# Patient Record
Sex: Female | Born: 1935
Health system: Southern US, Community
[De-identification: ages and names within clinical notes are randomized; demographics above are authoritative.]

## PROBLEM LIST (undated history)

## (undated) DIAGNOSIS — I272 Pulmonary hypertension, unspecified: Secondary | ICD-10-CM

## (undated) DIAGNOSIS — M19079 Primary osteoarthritis, unspecified ankle and foot: Secondary | ICD-10-CM

## (undated) DIAGNOSIS — Z923 Personal history of irradiation: Secondary | ICD-10-CM

## (undated) DIAGNOSIS — K922 Gastrointestinal hemorrhage, unspecified: Secondary | ICD-10-CM

## (undated) DIAGNOSIS — E785 Hyperlipidemia, unspecified: Secondary | ICD-10-CM

## (undated) DIAGNOSIS — K5790 Diverticulosis of intestine, part unspecified, without perforation or abscess without bleeding: Secondary | ICD-10-CM

## (undated) DIAGNOSIS — R339 Retention of urine, unspecified: Secondary | ICD-10-CM

## (undated) DIAGNOSIS — Z7901 Long term (current) use of anticoagulants: Secondary | ICD-10-CM

## (undated) DIAGNOSIS — I2510 Atherosclerotic heart disease of native coronary artery without angina pectoris: Secondary | ICD-10-CM

## (undated) DIAGNOSIS — I10 Essential (primary) hypertension: Secondary | ICD-10-CM

## (undated) DIAGNOSIS — C801 Malignant (primary) neoplasm, unspecified: Secondary | ICD-10-CM

## (undated) DIAGNOSIS — I4891 Unspecified atrial fibrillation: Secondary | ICD-10-CM

## (undated) DIAGNOSIS — K219 Gastro-esophageal reflux disease without esophagitis: Secondary | ICD-10-CM

## (undated) DIAGNOSIS — G459 Transient cerebral ischemic attack, unspecified: Secondary | ICD-10-CM

## (undated) DIAGNOSIS — R609 Edema, unspecified: Secondary | ICD-10-CM

## (undated) DIAGNOSIS — I639 Cerebral infarction, unspecified: Secondary | ICD-10-CM

## (undated) DIAGNOSIS — S22080A Wedge compression fracture of T11-T12 vertebra, initial encounter for closed fracture: Secondary | ICD-10-CM

## (undated) DIAGNOSIS — I340 Nonrheumatic mitral (valve) insufficiency: Secondary | ICD-10-CM

## (undated) DIAGNOSIS — E790 Hyperuricemia without signs of inflammatory arthritis and tophaceous disease: Secondary | ICD-10-CM

## (undated) DIAGNOSIS — R32 Unspecified urinary incontinence: Secondary | ICD-10-CM

## (undated) DIAGNOSIS — I509 Heart failure, unspecified: Secondary | ICD-10-CM

## (undated) HISTORY — DX: Edema, unspecified: R60.9

## (undated) HISTORY — DX: Pulmonary hypertension, unspecified: I27.20

## (undated) HISTORY — PX: TONSILLECTOMY AND ADENOIDECTOMY: SHX28

## (undated) HISTORY — PX: TONSILLECTOMY: SUR1361

## (undated) HISTORY — DX: Gastrointestinal hemorrhage, unspecified: K92.2

## (undated) HISTORY — DX: Nonrheumatic mitral (valve) insufficiency: I34.0

## (undated) HISTORY — DX: Essential (primary) hypertension: I10

## (undated) HISTORY — PX: DILATION AND CURETTAGE OF UTERUS: SHX78

## (undated) HISTORY — PX: MASTECTOMY: SHX3

## (undated) HISTORY — DX: Primary osteoarthritis, unspecified ankle and foot: M19.079

## (undated) HISTORY — DX: Heart failure, unspecified: I50.9

## (undated) HISTORY — DX: Transient cerebral ischemic attack, unspecified: G45.9

## (undated) HISTORY — PX: REPLACEMENT TOTAL KNEE BILATERAL: SUR1225

## (undated) HISTORY — DX: Long term (current) use of anticoagulants: Z79.01

## (undated) HISTORY — PX: GALLBLADDER SURGERY: SHX652

## (undated) HISTORY — PX: CATARACT EXTRACTION, BILATERAL: SHX1313

## (undated) HISTORY — DX: Retention of urine, unspecified: R33.9

## (undated) HISTORY — DX: Hyperlipidemia, unspecified: E78.5

## (undated) HISTORY — PX: APPENDECTOMY: SHX54

## (undated) HISTORY — DX: Atherosclerotic heart disease of native coronary artery without angina pectoris: I25.10

## (undated) HISTORY — DX: Cerebral infarction, unspecified: I63.9

## (undated) HISTORY — DX: Hyperuricemia without signs of inflammatory arthritis and tophaceous disease: E79.0

## (undated) HISTORY — DX: Malignant (primary) neoplasm, unspecified: C80.1

## (undated) HISTORY — DX: Unspecified urinary incontinence: R32

## (undated) HISTORY — DX: Unspecified atrial fibrillation: I48.91

## (undated) NOTE — *Deleted (*Deleted)
[facility]  Telephone:(336) [phone] Fax:(336) [phone]     ID: Adrienne [name] DOB: [birth year]  MR#: [id number]  XNA#:[id number]  Patient Care Team: Sigmund Hazel, MD as PCP - General (Family Medicine) Ardell Isaacs, Forrestine Him, MD as Consulting Physician (Obstetrics and Gynecology) Melaina Howerton, Valentino Hue, MD as Consulting Physician (Oncology) Jake Bathe, MD as Consulting Physician (Cardiology) Bufford Buttner, MD as Referring Physician (Dermatology) Josephine Igo, DO as Consulting Physician (Pulmonary Disease) Emelia Loron, MD as Consulting Physician (General Surgery) Antony Blackbird, MD as Consulting Physician (Radiation Oncology) Lowella Dell, MD OTHER MD:  CHIEF COMPLAINT: Bilateral breast cancer,  estrogen receptor positive (s/p right mastectomy)  CURRENT TREATMENT: Observation   INTERVAL HISTORY: Adrienne [name] returns today for follow up of her newly diagnosed left breast cancer accompanied by her husband.   Since her last visit, she underwent chest CT on 06/29/2020 showing: previous nodules in posterior right upper lobe and in lateral right middle lobe no longer identified; unchanged nodules in right lung base and right middle lobe; no new nodules; new collection in left breast measuring 7.1 cm.  She proceeded to left diagnostic mammography with tomography and left breast ultrasonography at [facility] on 07/31/2020 showing: breast density category C; complex fluid collection favored to represent a retracting hematoma at site of left lumpectomy; no evidence of malignancy or signs of infection.   REVIEW OF SYSTEMS: Adrienne [name]    COVID 19 VACCINATION STATUS: Status post Pfizer x2 with booster October 2021   RIGHT BREAST CANCER HISTORY: From the original intake note:  Adrienne [name] has a right breast mass noted by her primary care doctor in [place] in 2012.  She was referred to surgery and underwent right mastectomy and  axillary lymph node dissection or sampling (we do not have the pathology report) 06/04/2011 for what appears to have been a multicentric invasive ductal carcinoma, grade 2.  There were three separate tumor foci, spanning a total of 3.8 cm. Prognostic indicators were: ER positive, PR positive, and Her2 negative. One out of 3 biopsied lymph nodes was positive for micrometastasis.   LEFT BREAST CANCER HISTORY: Adrienne [name] had routine left screening mammography on 11/19/2019 showing a possible abnormality. She underwent left diagnostic mammography with tomography and left breast ultrasonography at [facility] on 12/05/2019 showing: breast density category C; 0.7 cm left breast mass at 11 o'clock; development of three left breast cysts compared to prior imaging from [place] in 11/2017; no left axillary adenopathy.  Accordingly on 12/10/2019 she proceeded to biopsy of the left breast area in question. The pathology from this procedure ([id number]) showed: invasive ductal carcinoma, grade 3; ductal carcinoma in situ. Prognostic indicators significant for: estrogen receptor, 0% negative and progesterone receptor, 0% negative. Proliferation marker Ki67 at 90%. HER2 negative by immunohistochemistry (0).  The patient's subsequent history is as detailed below.   PAST MEDICAL HISTORY: Past Medical History:  Diagnosis Date  . Afib (HCC)   . Arthritis of ankle   . CAD (coronary artery disease)   . Cancer [facility]) 2012   breast cancer-right  . Compression fracture of T12 vertebra (HCC)   . Diverticulosis   . Edema   . Elevated uric acid in blood   . GERD (gastroesophageal reflux disease)   . GI bleed 12/2015  . Hyperlipidemia   . Hypertension   . Long-term (current) use of anticoagulants   . Moderate mitral regurgitation   . Personal history of radiation therapy   . Pulmonary HTN (HCC)  Echo 1/19: EF 60-65, no RWMA, Ao sclerosis, trivial AI, mild MR, mod BAE, mod TR, PASP 49  . Stroke (HCC) 01/25/2016   . TIA (transient ischemic attack) 09/2011  . Urinary incontinence   . Urinary retention     PAST SURGICAL HISTORY: Past Surgical History:  Procedure Laterality Date  . APPENDECTOMY    . AXILLARY SENTINEL NODE BIOPSY Left 01/22/2020   Procedure: Left Axillary Sentinel Node Biopsy;  Surgeon: Emelia Loron, MD;  Location: [facility] OR;  Service: General;  Laterality: Left;  . BREAST LUMPECTOMY Left 01/2020  . BREAST LUMPECTOMY WITH RADIOACTIVE SEED AND SENTINEL LYMPH NODE BIOPSY Left 01/22/2020   Procedure: LEFT BREAST LUMPECTOMY WITH RADIOACTIVE SEED;  Surgeon: Emelia Loron, MD;  Location: [facility] OR;  Service: General;  Laterality: Left;  . BREAST SURGERY  2012   Rt.mastectomy--[place]  . CARDIAC CATHETERIZATION  2013  . CATARACT EXTRACTION, BILATERAL    . DILATION AND CURETTAGE OF UTERUS     in her early 34's for DUB  . ESOPHAGOGASTRODUODENOSCOPY Left 01/08/2016   Procedure: ESOPHAGOGASTRODUODENOSCOPY (EGD);  Surgeon: Willis Modena, MD;  Location: [facility];  Service: Endoscopy;  Laterality: Left;  . GALLBLADDER SURGERY    . MASTECTOMY Right   . RADIOLOGY WITH ANESTHESIA N/A 01/25/2016   Procedure: RADIOLOGY WITH ANESTHESIA;  Surgeon: Julieanne Cotton, MD;  Location: [facility] OR;  Service: Radiology;  Laterality: N/A;  . REPLACEMENT TOTAL KNEE BILATERAL    . TONSILLECTOMY    . TONSILLECTOMY AND ADENOIDECTOMY     as a child    FAMILY HISTORY: Family History  Problem Relation Age of Onset  . Asthma Mother   . CVA Father   . Atrial fibrillation Sister        HAS PACER  . Pneumonia Brother   . Cancer Daughter        COLON CANCER  . Cancer Maternal Grandmother        breast cancer   Patient's father was 54 years old when he died from stroke. Patient's mother died from coronary artery disease at age 80. The patient denies a family hx of ovarian cancer. She reports breast cancer in her maternal grandmother. She has 2 siblings, 1 brother and 1 sister. Her brother died from  stroke/AIDS. She also reports pancreatic cancer in her paternal grandmother and colon cancer in her daughter.   GYNECOLOGIC HISTORY:  Patient's last menstrual period was 09/12/1973 (approximate). Menarche: 81 years old Age at first live birth: 89 years old GX P 3 LMP late 23s Contraceptive never HRT yes, remote , less than 6 months Hysterectomy?  No BSO?  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  Her husband Adrienne [name] is a Public affairs consultant, (PhD, worked at [employer]).  Their children are Adrienne [name] who lives in [place] and whose husband is a Charity fundraiser; Adrienne Kettle [employer]") who lives in [place] and works in finances; and Atkins, who lives in [place] and works for [employer].  The patient has 11 grandchildren and 8 great-grandchildren.  She attends [employer] locally    ADVANCED DIRECTIVES: In the absence of documents to the contrary the patient's husband is her healthcare power of attorney   HEALTH MAINTENANCE: Social History   Tobacco Use  . Smoking status: Former Smoker    Packs/day: 0.25    Years: 2.00    Pack years: 0.50    Types: Cigarettes  Quit date: 69    Years since quitting: 71.9  . Smokeless tobacco: Never Used  Vaping Use  . Vaping Use: Never used  Substance Use Topics  . Alcohol use: No    Alcohol/week: 0.0 standard drinks  . Drug use: No     Colonoscopy: Remote  PAP: 11/2015, negative  Bone density: Osteopenia   Allergies  Allergen Reactions  . Valium [Diazepam] Other (See Comments)    HYPERACTIVITY   . Amiodarone Hcl [Amiodarone] Other (See Comments)    AFIB  . Compazine [Prochlorperazine] Other (See Comments)    HYPERACTIVITY   . Other Other (See Comments)    "Kidney dye"--patient states this gave her severe shakes/chills  . Thorazine [Chlorpromazine] Other (See Comments)    HYPERACTIVITY   . Zometa [Zoledronic Acid] Other (See  Comments)    PROBLEMS WITH EYES  TIA  . Ciprofloxacin Swelling    "swelling" in Rt.elbow    Current Outpatient Medications  Medication Sig Dispense Refill  . acetaminophen (TYLENOL) 500 MG tablet Take 500-1,000 mg by mouth every 12 (twelve) hours as needed (pain).     Marland Kitchen albuterol (VENTOLIN HFA) 108 (90 Base) MCG/ACT inhaler Inhale 1 puff into the lungs every 6 (six) hours as needed for wheezing or shortness of breath (Cough). 18 g 2  . amoxicillin (AMOXIL) 500 MG capsule Take 2,000 mg by mouth See admin instructions. Take 4 capsules (2000 mg) by mouth 1 hour prior to dental appointments. (Patient not taking: Reported on 07/15/2020)    . Ascorbic Acid (VITAMIN C WITH ROSE HIPS) 500 MG tablet Take 500 mg by mouth in the morning and at bedtime.    Marland Kitchen atorvastatin (LIPITOR) 10 MG tablet TAKE 1 TABLET DAILY 90 tablet 0  . benzonatate (TESSALON) 100 MG capsule Take 1 capsule (100 mg total) by mouth 2 (two) times daily as needed for cough. (Patient not taking: Reported on 07/15/2020) 30 capsule 1  . Calcium Citrate-Vitamin D (SM CALCIUM CITRATE+D3 PETITE PO) Take 1 tablet by mouth in the morning, at noon, and at bedtime.    . cefaclor (CECLOR) 250 MG capsule Take 1 capsule (250 mg total) by mouth 3 (three) times daily. 15 capsule 0  . diltiazem (CARDIZEM) 60 MG tablet TAKE 1 TABLET THREE TIMES A DAY 270 tablet 0  . ELIQUIS 5 MG TABS tablet TAKE 1 TABLET TWICE A DAY 180 tablet 1  . furosemide (LASIX) 20 MG tablet Take 1 tablet (20 mg total) by mouth daily. (Patient not taking: Reported on 07/15/2020) 90 tablet 3  . metoprolol tartrate (LOPRESSOR) 100 MG tablet TAKE 1 TABLET TWICE A DAY 180 tablet 0  . Multiple Vitamin (MULTIVITAMIN WITH MINERALS) TABS tablet Take 1 tablet by mouth daily. Centrum for Women 50+    . nitrofurantoin (MACRODANTIN) 100 MG capsule Take 100 mg by mouth at bedtime.     Marland Kitchen omeprazole (PRILOSEC) 10 MG capsule Take 10 mg by mouth daily before breakfast.     . Probiotic Product (UP4  PROBIOTICS WOMENS PO) Take 1 capsule by mouth daily.     No current facility-administered medications for this visit.    OBJECTIVE: White woman in no acute distress  There were no vitals filed for this visit.   There is no height or weight on file to calculate BMI.   Wt Readings from Last 3 Encounters:  07/15/20 145 lb 6.4 oz (66 kg)  07/14/20 144 lb (65.3 kg)  06/24/20 146 lb (66.2 kg)      ECOG  FS:1 - Symptomatic but completely ambulatory  Sclerae unicteric, EOMs intact Wearing a mask No cervical or supraclavicular adenopathy Lungs no rales or rhonchi Heart regular rate and rhythm Abd soft, nontender, positive bowel sounds MSK no focal spinal tenderness, no upper extremity lymphedema Neuro: nonfocal, well oriented, appropriate affect Breasts:    {Sclerae unicteric, EOMs intact Wearing a mask No cervical or supraclavicular adenopathy Lungs no rales or rhonchi Heart regular rate and rhythm Abd soft, nontender, positive bowel sounds MSK no focal spinal tenderness, no upper extremity lymphedema Neuro: nonfocal, well oriented, appropriate affect Breasts: Status post right mastectomy with no evidence of local recurrence.  Status post left lumpectomy and radiation.  There is minimal hyperpigmentation.  There is some coarsening of the skin as expected.  There is no evidence of local recurrence.  Both axillae are benign.}  LAB RESULTS:  CMP     Component Value Date/Time   NA 136 05/12/2020 1240   NA 145 (H) 05/06/2019 1608   K 4.6 05/12/2020 1240   CL 102 05/12/2020 1240   CO2 26 05/12/2020 1240   GLUCOSE 139 (H) 05/12/2020 1240   BUN 19 05/12/2020 1240   BUN 19 05/06/2019 1608   CREATININE 1.11 (H) 05/12/2020 1240   CALCIUM 10.9 (H) 05/12/2020 1240   PROT 7.5 05/12/2020 1240   PROT 6.6 05/06/2019 1608   ALBUMIN 3.5 05/12/2020 1240   ALBUMIN 3.9 05/06/2019 1608   AST 12 (L) 05/12/2020 1240   ALT 8 05/12/2020 1240   ALKPHOS 94 05/12/2020 1240   BILITOT 0.6  05/12/2020 1240   BILITOT 0.4 05/06/2019 1608   GFRNONAA 44 (L) 05/12/2020 1240   GFRAA 51 (L) 05/12/2020 1240    No results found for: TOTALPROTELP, ALBUMINELP, A1GS, A2GS, BETS, BETA2SER, GAMS, MSPIKE, SPEI  No results found for: KPAFRELGTCHN, LAMBDASER, KAPLAMBRATIO  Lab Results  Component Value Date   WBC 9.8 05/12/2020   NEUTROABS 7.4 05/12/2020   HGB 12.3 05/12/2020   HCT 37.8 05/12/2020   MCV 92.2 05/12/2020   PLT 287 05/12/2020    No results found for: LABCA2  No components found for: [id number]  No results for input(s): INR in the last 168 hours.  No results found for: LABCA2  No results found for: WUJ811  No results found for: BJY782  No results found for: NFA213  No results found for: CA2729  No components found for: HGQUANT  No results found for: CEA1 / No results found for: CEA1   No results found for: AFPTUMOR  No results found for: CHROMOGRNA  No results found for: HGBA, HGBA2QUANT, HGBFQUANT, HGBSQUAN (Hemoglobinopathy evaluation)   No results found for: LDH  No results found for: IRON, TIBC, IRONPCTSAT (Iron and TIBC)  No results found for: FERRITIN  Urinalysis    Component Value Date/Time   COLORURINE YELLOW 08/29/2017 2343   APPEARANCEUR CLOUDY (A) 08/29/2017 2343   LABSPEC 1.005 08/29/2017 2343   PHURINE 6.0 08/29/2017 2343   GLUCOSEU NEGATIVE 08/29/2017 2343   HGBUR SMALL (A) 08/29/2017 2343   BILIRUBINUR n 04/14/2020 1705   KETONESUR NEGATIVE 08/29/2017 2343   PROTEINUR Negative 04/14/2020 1705   PROTEINUR NEGATIVE 08/29/2017 2343   UROBILINOGEN 0.2 04/14/2020 1705   NITRITE n 04/14/2020 1705   NITRITE NEGATIVE 08/29/2017 2343   LEUKOCYTESUR Negative 04/14/2020 1705    STUDIES: US BREAST LTD UNI LEFT INC AXILLA  Result Date: 07/31/2020 CLINICAL DATA:  38 year old female with history of left breast cancer diagnosed in March 2021 status post lumpectomy and radiation.  Patient presents for evaluation of a mass seen in the  left breast on recent CT scan. EXAM: DIGITAL DIAGNOSTIC LEFT MAMMOGRAM WITH TOMO ULTRASOUND LEFT BREAST COMPARISON:  Previous exam(s). ACR Breast Density Category c: The breast tissue is heterogeneously dense, which may obscure small masses. FINDINGS: Mammogram: Full field tomosynthesis and a spot 2D magnification view of the lumpectomy site were performed. There is a large oval mass at the lumpectomy site measuring approximately 5.3 cm. There is mild diffuse skin thickening consistent with post radiation change. There are no additional new findings elsewhere in the left breast. On physical exam, I palpate firmness throughout the upper inner aspect of the left breast. There is no erythema or skin breakdown. Ultrasound: Targeted ultrasound is performed in the left breast at 12 o'clock 3 cm from nipple at the site of the patient's lumpectomy scar demonstrating an oval circumscribed complex mass with fluid components as well as fibrinous material, likely a retracting hematoma. No internal vascularity. IMPRESSION: At the left breast lumpectomy site there is a complex fluid collection favored to represent a retracting hematoma. No mammographic or sonographic evidence of malignancy. No imaging or clinical signs of infection. RECOMMENDATION: Return for routine diagnostic surveillance which will be due in March 2022. I have discussed the findings and recommendations with the patient. If applicable, a reminder letter will be sent to the patient regarding the next appointment. BI-RADS CATEGORY  2: Benign. Electronically Signed   By: Emmaline Kluver M.D.   On: 07/31/2020 13:30   MM DIAG BREAST TOMO UNI LEFT  Result Date: 07/31/2020 CLINICAL DATA:  55 year old female with history of left breast cancer diagnosed in March 2021 status post lumpectomy and radiation. Patient presents for evaluation of a mass seen in the left breast on recent CT scan. EXAM: DIGITAL DIAGNOSTIC LEFT MAMMOGRAM WITH TOMO ULTRASOUND LEFT BREAST  COMPARISON:  Previous exam(s). ACR Breast Density Category c: The breast tissue is heterogeneously dense, which may obscure small masses. FINDINGS: Mammogram: Full field tomosynthesis and a spot 2D magnification view of the lumpectomy site were performed. There is a large oval mass at the lumpectomy site measuring approximately 5.3 cm. There is mild diffuse skin thickening consistent with post radiation change. There are no additional new findings elsewhere in the left breast. On physical exam, I palpate firmness throughout the upper inner aspect of the left breast. There is no erythema or skin breakdown. Ultrasound: Targeted ultrasound is performed in the left breast at 12 o'clock 3 cm from nipple at the site of the patient's lumpectomy scar demonstrating an oval circumscribed complex mass with fluid components as well as fibrinous material, likely a retracting hematoma. No internal vascularity. IMPRESSION: At the left breast lumpectomy site there is a complex fluid collection favored to represent a retracting hematoma. No mammographic or sonographic evidence of malignancy. No imaging or clinical signs of infection. RECOMMENDATION: Return for routine diagnostic surveillance which will be due in March 2022. I have discussed the findings and recommendations with the patient. If applicable, a reminder letter will be sent to the patient regarding the next appointment. BI-RADS CATEGORY  2: Benign. Electronically Signed   By: Emmaline Kluver M.D.   On: 07/31/2020 13:30     ELIGIBLE FOR AVAILABLE RESEARCH PROTOCOL: No  ASSESSMENT: 29 y.o. [place] woman   RIGHT BREAST CANCER (1) status post right mastectomy and sentinel lymph node sampling September 2012 for what appears to have been a multifocal T1-T2 N1 (mic) invasive ductal carcinoma, estrogen and progesterone receptor positive, HER-2 not  amplified  (2) completed 5 years of anastrozole 2017  LEFT BREAST CANCER (3) status post left breast upper inner  quadrant biopsy 12/10/2019 for a clinical T1b N0, stage IB invasive ductal carcinoma grade 3, triple negative, with an MIB-1 of 90%  (4) status post left lumpectomy and sentinel lymph node sampling 01/22/2020 for a pT1a pN0, stage IA invasive ductal carcinoma, with negative margins  (a) ductal carcinoma in situ with necrosis also removed, all margins negative  (b) a total of 2 lymph nodes were removed  (5) adjuvant chemotherapy discussed, opted against  (6) meets criteria for genetics testing  (7) adjuvant radiation 03/02/2020 through 03/24/2020 Site Technique Total Dose (Gy) Dose per Fx (Gy) Completed Fx Beam Energies  Breast, Left: Breast_Lt 3D 42.72/42.72 2.67 16/16 6X, 15X    PLAN: Reita Cliche has completed her local treatment and did very well with it.  She is not a candidate for systemic therapy as her invasive cancer was triple negative but also T1a,  and chemotherapy is not indicated in that setting.  She wanted to know which arm she should have blood drawn from or her blood pressure taken on.  We do not know how many lymph nodes were removed from her right arm.  However that was remote and I suggested for the time being she should offer her right arm for blood draws and blood pressure and spare the left.  She will resume mammography in April 2022 and see me in May of that year.  She knows to call for any other issues that may develop before that visit.  Total encounter time 30 minutes.Lowella Dell, MD   08/05/2020 11:04 AM Medical Oncology and Hematology [facility] [address] [place], [place] [address] Tel. [phone]    Fax. [phone]   This document serves as a record of services personally performed by Ruthann Cancer, MD. It was created on his behalf by Mickie Bail, a trained medical scribe. The creation of this record is based on the scribe's personal observations and the provider's statements to them.   I, Ruthann Cancer MD, have  reviewed the above documentation for accuracy and completeness, and I agree with the above.   *Total Encounter Time as defined by the Centers for Medicare and Medicaid Services includes, in addition to the face-to-face time of a patient visit (documented in the note above) non-face-to-face time: obtaining and reviewing outside history, ordering and reviewing medications, tests or procedures, care coordination (communications with other health care professionals or caregivers) and documentation in the medical record.

---

## 2008-02-06 ENCOUNTER — Emergency Department (HOSPITAL_COMMUNITY): Admission: EM | Admit: 2008-02-06 | Discharge: 2008-02-07 | Payer: Self-pay | Admitting: Emergency Medicine

## 2010-09-12 DIAGNOSIS — C801 Malignant (primary) neoplasm, unspecified: Secondary | ICD-10-CM

## 2010-09-12 HISTORY — PX: BREAST SURGERY: SHX581

## 2010-09-12 HISTORY — DX: Malignant (primary) neoplasm, unspecified: C80.1

## 2011-06-08 LAB — DIFFERENTIAL
Basophils Relative: 0
Monocytes Absolute: 0.8
Monocytes Relative: 9
Neutro Abs: 5.6

## 2011-06-08 LAB — POCT I-STAT, CHEM 8
BUN: 15
Creatinine, Ser: 1
Potassium: 3.9
Sodium: 139
TCO2: 27

## 2011-06-08 LAB — CBC
HCT: 36.8
Hemoglobin: 12.5
MCHC: 34
RBC: 3.98

## 2011-09-13 DIAGNOSIS — G459 Transient cerebral ischemic attack, unspecified: Secondary | ICD-10-CM

## 2011-09-13 HISTORY — DX: Transient cerebral ischemic attack, unspecified: G45.9

## 2011-09-13 HISTORY — PX: CARDIAC CATHETERIZATION: SHX172

## 2013-09-12 DIAGNOSIS — R296 Repeated falls: Secondary | ICD-10-CM | POA: Diagnosis not present

## 2013-09-12 DIAGNOSIS — S6990XA Unspecified injury of unspecified wrist, hand and finger(s), initial encounter: Secondary | ICD-10-CM | POA: Diagnosis not present

## 2013-09-12 DIAGNOSIS — M259 Joint disorder, unspecified: Secondary | ICD-10-CM | POA: Diagnosis not present

## 2013-09-12 DIAGNOSIS — S66819A Strain of other specified muscles, fascia and tendons at wrist and hand level, unspecified hand, initial encounter: Secondary | ICD-10-CM | POA: Diagnosis not present

## 2013-09-12 DIAGNOSIS — S59919A Unspecified injury of unspecified forearm, initial encounter: Secondary | ICD-10-CM | POA: Diagnosis not present

## 2013-09-12 DIAGNOSIS — S63509A Unspecified sprain of unspecified wrist, initial encounter: Secondary | ICD-10-CM | POA: Diagnosis not present

## 2013-09-12 DIAGNOSIS — S63599A Other specified sprain of unspecified wrist, initial encounter: Secondary | ICD-10-CM | POA: Diagnosis not present

## 2013-09-12 DIAGNOSIS — S59909A Unspecified injury of unspecified elbow, initial encounter: Secondary | ICD-10-CM | POA: Diagnosis not present

## 2013-09-12 DIAGNOSIS — M25539 Pain in unspecified wrist: Secondary | ICD-10-CM | POA: Diagnosis not present

## 2013-09-17 DIAGNOSIS — I4891 Unspecified atrial fibrillation: Secondary | ICD-10-CM | POA: Diagnosis not present

## 2013-09-20 DIAGNOSIS — M899 Disorder of bone, unspecified: Secondary | ICD-10-CM | POA: Diagnosis not present

## 2013-09-20 DIAGNOSIS — C50919 Malignant neoplasm of unspecified site of unspecified female breast: Secondary | ICD-10-CM | POA: Diagnosis not present

## 2013-09-20 DIAGNOSIS — M949 Disorder of cartilage, unspecified: Secondary | ICD-10-CM | POA: Diagnosis not present

## 2013-09-25 DIAGNOSIS — I509 Heart failure, unspecified: Secondary | ICD-10-CM | POA: Diagnosis not present

## 2013-09-25 DIAGNOSIS — I5032 Chronic diastolic (congestive) heart failure: Secondary | ICD-10-CM | POA: Diagnosis not present

## 2013-09-25 DIAGNOSIS — I10 Essential (primary) hypertension: Secondary | ICD-10-CM | POA: Diagnosis not present

## 2013-09-25 DIAGNOSIS — I4891 Unspecified atrial fibrillation: Secondary | ICD-10-CM | POA: Diagnosis not present

## 2013-10-02 DIAGNOSIS — I5030 Unspecified diastolic (congestive) heart failure: Secondary | ICD-10-CM | POA: Diagnosis not present

## 2013-10-08 DIAGNOSIS — M79609 Pain in unspecified limb: Secondary | ICD-10-CM | POA: Diagnosis not present

## 2013-10-08 DIAGNOSIS — M810 Age-related osteoporosis without current pathological fracture: Secondary | ICD-10-CM | POA: Diagnosis not present

## 2013-10-08 DIAGNOSIS — IMO0001 Reserved for inherently not codable concepts without codable children: Secondary | ICD-10-CM | POA: Diagnosis not present

## 2013-10-08 DIAGNOSIS — I270 Primary pulmonary hypertension: Secondary | ICD-10-CM | POA: Diagnosis not present

## 2013-10-08 DIAGNOSIS — E7800 Pure hypercholesterolemia, unspecified: Secondary | ICD-10-CM | POA: Diagnosis not present

## 2013-10-08 DIAGNOSIS — M949 Disorder of cartilage, unspecified: Secondary | ICD-10-CM | POA: Diagnosis not present

## 2013-10-08 DIAGNOSIS — Z09 Encounter for follow-up examination after completed treatment for conditions other than malignant neoplasm: Secondary | ICD-10-CM | POA: Diagnosis not present

## 2013-10-08 DIAGNOSIS — I10 Essential (primary) hypertension: Secondary | ICD-10-CM | POA: Diagnosis not present

## 2013-10-14 DIAGNOSIS — I4891 Unspecified atrial fibrillation: Secondary | ICD-10-CM | POA: Diagnosis not present

## 2013-10-24 DIAGNOSIS — N3020 Other chronic cystitis without hematuria: Secondary | ICD-10-CM | POA: Diagnosis not present

## 2013-11-11 DIAGNOSIS — I4891 Unspecified atrial fibrillation: Secondary | ICD-10-CM | POA: Diagnosis not present

## 2013-11-11 DIAGNOSIS — I10 Essential (primary) hypertension: Secondary | ICD-10-CM | POA: Diagnosis not present

## 2013-11-20 DIAGNOSIS — N952 Postmenopausal atrophic vaginitis: Secondary | ICD-10-CM | POA: Diagnosis not present

## 2013-11-20 DIAGNOSIS — N390 Urinary tract infection, site not specified: Secondary | ICD-10-CM | POA: Diagnosis not present

## 2013-11-20 DIAGNOSIS — N8112 Cystocele, lateral: Secondary | ICD-10-CM | POA: Diagnosis not present

## 2013-11-26 DIAGNOSIS — I4891 Unspecified atrial fibrillation: Secondary | ICD-10-CM | POA: Diagnosis not present

## 2013-11-26 DIAGNOSIS — I10 Essential (primary) hypertension: Secondary | ICD-10-CM | POA: Diagnosis not present

## 2013-12-06 DIAGNOSIS — R300 Dysuria: Secondary | ICD-10-CM | POA: Diagnosis not present

## 2013-12-06 DIAGNOSIS — I4891 Unspecified atrial fibrillation: Secondary | ICD-10-CM | POA: Diagnosis not present

## 2013-12-06 DIAGNOSIS — H1130 Conjunctival hemorrhage, unspecified eye: Secondary | ICD-10-CM | POA: Diagnosis not present

## 2013-12-06 DIAGNOSIS — Z6828 Body mass index (BMI) 28.0-28.9, adult: Secondary | ICD-10-CM | POA: Diagnosis not present

## 2013-12-06 DIAGNOSIS — N390 Urinary tract infection, site not specified: Secondary | ICD-10-CM | POA: Diagnosis not present

## 2013-12-10 DIAGNOSIS — Z9010 Acquired absence of unspecified breast and nipple: Secondary | ICD-10-CM | POA: Diagnosis not present

## 2013-12-10 DIAGNOSIS — H1130 Conjunctival hemorrhage, unspecified eye: Secondary | ICD-10-CM | POA: Diagnosis not present

## 2013-12-10 DIAGNOSIS — C50919 Malignant neoplasm of unspecified site of unspecified female breast: Secondary | ICD-10-CM | POA: Diagnosis not present

## 2013-12-13 DIAGNOSIS — I4891 Unspecified atrial fibrillation: Secondary | ICD-10-CM | POA: Diagnosis not present

## 2013-12-27 DIAGNOSIS — I4891 Unspecified atrial fibrillation: Secondary | ICD-10-CM | POA: Diagnosis not present

## 2014-01-07 DIAGNOSIS — L821 Other seborrheic keratosis: Secondary | ICD-10-CM | POA: Diagnosis not present

## 2014-01-07 DIAGNOSIS — D2360 Other benign neoplasm of skin of unspecified upper limb, including shoulder: Secondary | ICD-10-CM | POA: Diagnosis not present

## 2014-01-07 DIAGNOSIS — R059 Cough, unspecified: Secondary | ICD-10-CM | POA: Diagnosis not present

## 2014-01-07 DIAGNOSIS — R05 Cough: Secondary | ICD-10-CM | POA: Diagnosis not present

## 2014-01-10 DIAGNOSIS — E119 Type 2 diabetes mellitus without complications: Secondary | ICD-10-CM | POA: Diagnosis not present

## 2014-01-10 DIAGNOSIS — R062 Wheezing: Secondary | ICD-10-CM | POA: Diagnosis not present

## 2014-01-10 DIAGNOSIS — J45909 Unspecified asthma, uncomplicated: Secondary | ICD-10-CM | POA: Diagnosis not present

## 2014-01-10 DIAGNOSIS — I517 Cardiomegaly: Secondary | ICD-10-CM | POA: Diagnosis not present

## 2014-01-10 DIAGNOSIS — J189 Pneumonia, unspecified organism: Secondary | ICD-10-CM | POA: Diagnosis not present

## 2014-01-10 DIAGNOSIS — R0602 Shortness of breath: Secondary | ICD-10-CM | POA: Diagnosis not present

## 2014-01-10 DIAGNOSIS — K219 Gastro-esophageal reflux disease without esophagitis: Secondary | ICD-10-CM | POA: Diagnosis not present

## 2014-01-10 DIAGNOSIS — J9600 Acute respiratory failure, unspecified whether with hypoxia or hypercapnia: Secondary | ICD-10-CM | POA: Diagnosis not present

## 2014-01-10 DIAGNOSIS — R05 Cough: Secondary | ICD-10-CM | POA: Diagnosis not present

## 2014-01-10 DIAGNOSIS — I700 Atherosclerosis of aorta: Secondary | ICD-10-CM | POA: Diagnosis not present

## 2014-01-10 DIAGNOSIS — I5032 Chronic diastolic (congestive) heart failure: Secondary | ICD-10-CM | POA: Diagnosis not present

## 2014-01-10 DIAGNOSIS — I4891 Unspecified atrial fibrillation: Secondary | ICD-10-CM | POA: Diagnosis not present

## 2014-01-10 DIAGNOSIS — J069 Acute upper respiratory infection, unspecified: Secondary | ICD-10-CM | POA: Diagnosis not present

## 2014-01-10 DIAGNOSIS — R059 Cough, unspecified: Secondary | ICD-10-CM | POA: Diagnosis not present

## 2014-01-11 DIAGNOSIS — I5031 Acute diastolic (congestive) heart failure: Secondary | ICD-10-CM | POA: Diagnosis not present

## 2014-01-11 DIAGNOSIS — R0602 Shortness of breath: Secondary | ICD-10-CM | POA: Diagnosis not present

## 2014-01-11 DIAGNOSIS — R0982 Postnasal drip: Secondary | ICD-10-CM | POA: Diagnosis present

## 2014-01-11 DIAGNOSIS — I5032 Chronic diastolic (congestive) heart failure: Secondary | ICD-10-CM | POA: Diagnosis not present

## 2014-01-11 DIAGNOSIS — J9801 Acute bronchospasm: Secondary | ICD-10-CM | POA: Diagnosis not present

## 2014-01-11 DIAGNOSIS — J069 Acute upper respiratory infection, unspecified: Secondary | ICD-10-CM | POA: Diagnosis not present

## 2014-01-11 DIAGNOSIS — I509 Heart failure, unspecified: Secondary | ICD-10-CM | POA: Diagnosis not present

## 2014-01-11 DIAGNOSIS — Z96659 Presence of unspecified artificial knee joint: Secondary | ICD-10-CM | POA: Diagnosis not present

## 2014-01-11 DIAGNOSIS — F3289 Other specified depressive episodes: Secondary | ICD-10-CM | POA: Diagnosis present

## 2014-01-11 DIAGNOSIS — I2789 Other specified pulmonary heart diseases: Secondary | ICD-10-CM | POA: Diagnosis not present

## 2014-01-11 DIAGNOSIS — J9600 Acute respiratory failure, unspecified whether with hypoxia or hypercapnia: Secondary | ICD-10-CM | POA: Diagnosis not present

## 2014-01-11 DIAGNOSIS — R0902 Hypoxemia: Secondary | ICD-10-CM | POA: Diagnosis not present

## 2014-01-11 DIAGNOSIS — Z87891 Personal history of nicotine dependence: Secondary | ICD-10-CM | POA: Diagnosis not present

## 2014-01-11 DIAGNOSIS — Z9010 Acquired absence of unspecified breast and nipple: Secondary | ICD-10-CM | POA: Diagnosis not present

## 2014-01-11 DIAGNOSIS — Z853 Personal history of malignant neoplasm of breast: Secondary | ICD-10-CM | POA: Diagnosis not present

## 2014-01-11 DIAGNOSIS — J159 Unspecified bacterial pneumonia: Secondary | ICD-10-CM | POA: Diagnosis not present

## 2014-01-11 DIAGNOSIS — I4891 Unspecified atrial fibrillation: Secondary | ICD-10-CM | POA: Diagnosis not present

## 2014-01-11 DIAGNOSIS — F329 Major depressive disorder, single episode, unspecified: Secondary | ICD-10-CM | POA: Diagnosis present

## 2014-01-11 DIAGNOSIS — Z7901 Long term (current) use of anticoagulants: Secondary | ICD-10-CM | POA: Diagnosis not present

## 2014-01-11 DIAGNOSIS — E119 Type 2 diabetes mellitus without complications: Secondary | ICD-10-CM | POA: Diagnosis not present

## 2014-01-11 DIAGNOSIS — K219 Gastro-esophageal reflux disease without esophagitis: Secondary | ICD-10-CM | POA: Diagnosis not present

## 2014-01-11 DIAGNOSIS — Z8673 Personal history of transient ischemic attack (TIA), and cerebral infarction without residual deficits: Secondary | ICD-10-CM | POA: Diagnosis not present

## 2014-01-11 DIAGNOSIS — Z79899 Other long term (current) drug therapy: Secondary | ICD-10-CM | POA: Diagnosis not present

## 2014-01-11 DIAGNOSIS — Z9221 Personal history of antineoplastic chemotherapy: Secondary | ICD-10-CM | POA: Diagnosis not present

## 2014-01-11 DIAGNOSIS — E785 Hyperlipidemia, unspecified: Secondary | ICD-10-CM | POA: Diagnosis present

## 2014-01-13 DIAGNOSIS — I4891 Unspecified atrial fibrillation: Secondary | ICD-10-CM | POA: Diagnosis not present

## 2014-01-13 DIAGNOSIS — B370 Candidal stomatitis: Secondary | ICD-10-CM | POA: Diagnosis not present

## 2014-01-13 DIAGNOSIS — J209 Acute bronchitis, unspecified: Secondary | ICD-10-CM | POA: Diagnosis not present

## 2014-01-13 DIAGNOSIS — Z6827 Body mass index (BMI) 27.0-27.9, adult: Secondary | ICD-10-CM | POA: Diagnosis not present

## 2014-01-20 DIAGNOSIS — Z6827 Body mass index (BMI) 27.0-27.9, adult: Secondary | ICD-10-CM | POA: Diagnosis not present

## 2014-01-20 DIAGNOSIS — B370 Candidal stomatitis: Secondary | ICD-10-CM | POA: Diagnosis not present

## 2014-01-20 DIAGNOSIS — J209 Acute bronchitis, unspecified: Secondary | ICD-10-CM | POA: Diagnosis not present

## 2014-01-20 DIAGNOSIS — I4891 Unspecified atrial fibrillation: Secondary | ICD-10-CM | POA: Diagnosis not present

## 2014-01-21 DIAGNOSIS — I10 Essential (primary) hypertension: Secondary | ICD-10-CM | POA: Diagnosis not present

## 2014-01-21 DIAGNOSIS — Z6828 Body mass index (BMI) 28.0-28.9, adult: Secondary | ICD-10-CM | POA: Diagnosis not present

## 2014-01-21 DIAGNOSIS — R911 Solitary pulmonary nodule: Secondary | ICD-10-CM | POA: Diagnosis not present

## 2014-01-21 DIAGNOSIS — J069 Acute upper respiratory infection, unspecified: Secondary | ICD-10-CM | POA: Diagnosis not present

## 2014-01-21 DIAGNOSIS — E7800 Pure hypercholesterolemia, unspecified: Secondary | ICD-10-CM | POA: Diagnosis not present

## 2014-01-27 DIAGNOSIS — K5730 Diverticulosis of large intestine without perforation or abscess without bleeding: Secondary | ICD-10-CM | POA: Diagnosis not present

## 2014-01-27 DIAGNOSIS — R918 Other nonspecific abnormal finding of lung field: Secondary | ICD-10-CM | POA: Diagnosis not present

## 2014-01-27 DIAGNOSIS — Z9010 Acquired absence of unspecified breast and nipple: Secondary | ICD-10-CM | POA: Diagnosis not present

## 2014-01-27 DIAGNOSIS — R911 Solitary pulmonary nodule: Secondary | ICD-10-CM | POA: Diagnosis not present

## 2014-01-27 DIAGNOSIS — I2510 Atherosclerotic heart disease of native coronary artery without angina pectoris: Secondary | ICD-10-CM | POA: Diagnosis not present

## 2014-01-30 DIAGNOSIS — I4891 Unspecified atrial fibrillation: Secondary | ICD-10-CM | POA: Diagnosis not present

## 2014-01-30 DIAGNOSIS — Z7901 Long term (current) use of anticoagulants: Secondary | ICD-10-CM | POA: Diagnosis not present

## 2014-01-30 DIAGNOSIS — R9389 Abnormal findings on diagnostic imaging of other specified body structures: Secondary | ICD-10-CM | POA: Diagnosis not present

## 2014-02-04 DIAGNOSIS — I4891 Unspecified atrial fibrillation: Secondary | ICD-10-CM | POA: Diagnosis not present

## 2014-02-21 DIAGNOSIS — I4891 Unspecified atrial fibrillation: Secondary | ICD-10-CM | POA: Diagnosis not present

## 2014-02-21 DIAGNOSIS — R9389 Abnormal findings on diagnostic imaging of other specified body structures: Secondary | ICD-10-CM | POA: Diagnosis not present

## 2014-02-21 DIAGNOSIS — I77810 Thoracic aortic ectasia: Secondary | ICD-10-CM | POA: Diagnosis not present

## 2014-03-05 DIAGNOSIS — I4891 Unspecified atrial fibrillation: Secondary | ICD-10-CM | POA: Diagnosis not present

## 2014-03-12 DIAGNOSIS — I4891 Unspecified atrial fibrillation: Secondary | ICD-10-CM | POA: Diagnosis not present

## 2014-03-17 DIAGNOSIS — I359 Nonrheumatic aortic valve disorder, unspecified: Secondary | ICD-10-CM | POA: Diagnosis not present

## 2014-03-17 DIAGNOSIS — I2789 Other specified pulmonary heart diseases: Secondary | ICD-10-CM | POA: Diagnosis not present

## 2014-03-17 DIAGNOSIS — I079 Rheumatic tricuspid valve disease, unspecified: Secondary | ICD-10-CM | POA: Diagnosis not present

## 2014-03-17 DIAGNOSIS — I517 Cardiomegaly: Secondary | ICD-10-CM | POA: Diagnosis not present

## 2014-03-17 DIAGNOSIS — I059 Rheumatic mitral valve disease, unspecified: Secondary | ICD-10-CM | POA: Diagnosis not present

## 2014-03-17 DIAGNOSIS — I4891 Unspecified atrial fibrillation: Secondary | ICD-10-CM | POA: Diagnosis not present

## 2014-03-17 DIAGNOSIS — R0602 Shortness of breath: Secondary | ICD-10-CM | POA: Diagnosis not present

## 2014-03-19 DIAGNOSIS — C50919 Malignant neoplasm of unspecified site of unspecified female breast: Secondary | ICD-10-CM | POA: Diagnosis not present

## 2014-03-19 DIAGNOSIS — M899 Disorder of bone, unspecified: Secondary | ICD-10-CM | POA: Diagnosis not present

## 2014-03-19 DIAGNOSIS — M949 Disorder of cartilage, unspecified: Secondary | ICD-10-CM | POA: Diagnosis not present

## 2014-03-21 DIAGNOSIS — I4891 Unspecified atrial fibrillation: Secondary | ICD-10-CM | POA: Diagnosis not present

## 2014-03-31 DIAGNOSIS — I5032 Chronic diastolic (congestive) heart failure: Secondary | ICD-10-CM | POA: Diagnosis not present

## 2014-03-31 DIAGNOSIS — I4891 Unspecified atrial fibrillation: Secondary | ICD-10-CM | POA: Diagnosis not present

## 2014-04-01 DIAGNOSIS — I270 Primary pulmonary hypertension: Secondary | ICD-10-CM | POA: Diagnosis not present

## 2014-04-14 DIAGNOSIS — I4891 Unspecified atrial fibrillation: Secondary | ICD-10-CM | POA: Diagnosis not present

## 2014-04-18 DIAGNOSIS — I700 Atherosclerosis of aorta: Secondary | ICD-10-CM | POA: Diagnosis not present

## 2014-04-18 DIAGNOSIS — J40 Bronchitis, not specified as acute or chronic: Secondary | ICD-10-CM | POA: Diagnosis not present

## 2014-04-18 DIAGNOSIS — J984 Other disorders of lung: Secondary | ICD-10-CM | POA: Diagnosis not present

## 2014-04-21 DIAGNOSIS — I4891 Unspecified atrial fibrillation: Secondary | ICD-10-CM | POA: Diagnosis not present

## 2014-05-05 DIAGNOSIS — J984 Other disorders of lung: Secondary | ICD-10-CM | POA: Diagnosis not present

## 2014-05-05 DIAGNOSIS — R918 Other nonspecific abnormal finding of lung field: Secondary | ICD-10-CM | POA: Diagnosis not present

## 2014-05-05 DIAGNOSIS — I4891 Unspecified atrial fibrillation: Secondary | ICD-10-CM | POA: Diagnosis not present

## 2014-05-05 DIAGNOSIS — Z23 Encounter for immunization: Secondary | ICD-10-CM | POA: Diagnosis not present

## 2014-05-14 DIAGNOSIS — I4891 Unspecified atrial fibrillation: Secondary | ICD-10-CM | POA: Diagnosis not present

## 2014-05-14 DIAGNOSIS — N8112 Cystocele, lateral: Secondary | ICD-10-CM | POA: Diagnosis not present

## 2014-05-28 DIAGNOSIS — I4891 Unspecified atrial fibrillation: Secondary | ICD-10-CM | POA: Diagnosis not present

## 2014-06-02 DIAGNOSIS — H2510 Age-related nuclear cataract, unspecified eye: Secondary | ICD-10-CM | POA: Diagnosis not present

## 2014-06-09 DIAGNOSIS — I4891 Unspecified atrial fibrillation: Secondary | ICD-10-CM | POA: Diagnosis not present

## 2014-06-09 DIAGNOSIS — Z0000 Encounter for general adult medical examination without abnormal findings: Secondary | ICD-10-CM | POA: Diagnosis not present

## 2014-06-09 DIAGNOSIS — I10 Essential (primary) hypertension: Secondary | ICD-10-CM | POA: Diagnosis not present

## 2014-06-09 DIAGNOSIS — E7800 Pure hypercholesterolemia, unspecified: Secondary | ICD-10-CM | POA: Diagnosis not present

## 2014-06-09 DIAGNOSIS — R0989 Other specified symptoms and signs involving the circulatory and respiratory systems: Secondary | ICD-10-CM | POA: Diagnosis not present

## 2014-06-09 DIAGNOSIS — E559 Vitamin D deficiency, unspecified: Secondary | ICD-10-CM | POA: Diagnosis not present

## 2014-06-09 DIAGNOSIS — R0609 Other forms of dyspnea: Secondary | ICD-10-CM | POA: Diagnosis not present

## 2014-06-09 DIAGNOSIS — Z23 Encounter for immunization: Secondary | ICD-10-CM | POA: Diagnosis not present

## 2014-06-09 DIAGNOSIS — E119 Type 2 diabetes mellitus without complications: Secondary | ICD-10-CM | POA: Diagnosis not present

## 2014-06-09 DIAGNOSIS — R609 Edema, unspecified: Secondary | ICD-10-CM | POA: Diagnosis not present

## 2014-06-17 DIAGNOSIS — H2511 Age-related nuclear cataract, right eye: Secondary | ICD-10-CM | POA: Diagnosis not present

## 2014-06-23 DIAGNOSIS — I4891 Unspecified atrial fibrillation: Secondary | ICD-10-CM | POA: Diagnosis not present

## 2014-07-04 DIAGNOSIS — I4891 Unspecified atrial fibrillation: Secondary | ICD-10-CM | POA: Diagnosis not present

## 2014-07-08 DIAGNOSIS — L570 Actinic keratosis: Secondary | ICD-10-CM | POA: Diagnosis not present

## 2014-07-11 DIAGNOSIS — R300 Dysuria: Secondary | ICD-10-CM | POA: Diagnosis not present

## 2014-07-15 DIAGNOSIS — H269 Unspecified cataract: Secondary | ICD-10-CM | POA: Diagnosis not present

## 2014-07-15 DIAGNOSIS — H2512 Age-related nuclear cataract, left eye: Secondary | ICD-10-CM | POA: Diagnosis not present

## 2014-07-18 DIAGNOSIS — M79662 Pain in left lower leg: Secondary | ICD-10-CM | POA: Diagnosis not present

## 2014-07-18 DIAGNOSIS — M7989 Other specified soft tissue disorders: Secondary | ICD-10-CM | POA: Diagnosis not present

## 2014-07-18 DIAGNOSIS — R600 Localized edema: Secondary | ICD-10-CM | POA: Diagnosis not present

## 2014-07-18 DIAGNOSIS — M79605 Pain in left leg: Secondary | ICD-10-CM | POA: Diagnosis not present

## 2014-07-18 DIAGNOSIS — T148 Other injury of unspecified body region: Secondary | ICD-10-CM | POA: Diagnosis not present

## 2014-07-21 DIAGNOSIS — Z1231 Encounter for screening mammogram for malignant neoplasm of breast: Secondary | ICD-10-CM | POA: Diagnosis not present

## 2014-07-21 DIAGNOSIS — Z853 Personal history of malignant neoplasm of breast: Secondary | ICD-10-CM | POA: Diagnosis not present

## 2014-07-21 DIAGNOSIS — R922 Inconclusive mammogram: Secondary | ICD-10-CM | POA: Diagnosis not present

## 2014-07-21 DIAGNOSIS — Z79899 Other long term (current) drug therapy: Secondary | ICD-10-CM | POA: Diagnosis not present

## 2014-07-21 DIAGNOSIS — C50911 Malignant neoplasm of unspecified site of right female breast: Secondary | ICD-10-CM | POA: Diagnosis not present

## 2014-07-21 DIAGNOSIS — Z9012 Acquired absence of left breast and nipple: Secondary | ICD-10-CM | POA: Diagnosis not present

## 2014-07-22 DIAGNOSIS — I480 Paroxysmal atrial fibrillation: Secondary | ICD-10-CM | POA: Diagnosis not present

## 2014-08-01 DIAGNOSIS — I4891 Unspecified atrial fibrillation: Secondary | ICD-10-CM | POA: Diagnosis not present

## 2014-08-05 DIAGNOSIS — N390 Urinary tract infection, site not specified: Secondary | ICD-10-CM | POA: Diagnosis not present

## 2014-08-27 DIAGNOSIS — I480 Paroxysmal atrial fibrillation: Secondary | ICD-10-CM | POA: Diagnosis not present

## 2014-08-27 DIAGNOSIS — J069 Acute upper respiratory infection, unspecified: Secondary | ICD-10-CM | POA: Diagnosis not present

## 2014-09-01 DIAGNOSIS — I4891 Unspecified atrial fibrillation: Secondary | ICD-10-CM | POA: Diagnosis not present

## 2014-09-01 DIAGNOSIS — N390 Urinary tract infection, site not specified: Secondary | ICD-10-CM | POA: Diagnosis not present

## 2014-09-01 DIAGNOSIS — R300 Dysuria: Secondary | ICD-10-CM | POA: Diagnosis not present

## 2014-09-15 DIAGNOSIS — I4891 Unspecified atrial fibrillation: Secondary | ICD-10-CM | POA: Diagnosis not present

## 2014-09-16 DIAGNOSIS — N812 Incomplete uterovaginal prolapse: Secondary | ICD-10-CM | POA: Diagnosis not present

## 2014-09-16 DIAGNOSIS — N952 Postmenopausal atrophic vaginitis: Secondary | ICD-10-CM | POA: Diagnosis not present

## 2014-09-19 DIAGNOSIS — Z79811 Long term (current) use of aromatase inhibitors: Secondary | ICD-10-CM | POA: Diagnosis not present

## 2014-09-19 DIAGNOSIS — R911 Solitary pulmonary nodule: Secondary | ICD-10-CM | POA: Diagnosis not present

## 2014-09-19 DIAGNOSIS — C50911 Malignant neoplasm of unspecified site of right female breast: Secondary | ICD-10-CM | POA: Diagnosis not present

## 2014-09-19 DIAGNOSIS — Z9011 Acquired absence of right breast and nipple: Secondary | ICD-10-CM | POA: Diagnosis not present

## 2014-09-19 DIAGNOSIS — Z170 Estrogen receptor positive status [ER+]: Secondary | ICD-10-CM | POA: Diagnosis not present

## 2014-09-23 DIAGNOSIS — I480 Paroxysmal atrial fibrillation: Secondary | ICD-10-CM | POA: Diagnosis not present

## 2014-09-25 DIAGNOSIS — K449 Diaphragmatic hernia without obstruction or gangrene: Secondary | ICD-10-CM | POA: Diagnosis not present

## 2014-09-25 DIAGNOSIS — I700 Atherosclerosis of aorta: Secondary | ICD-10-CM | POA: Diagnosis not present

## 2014-09-25 DIAGNOSIS — I270 Primary pulmonary hypertension: Secondary | ICD-10-CM | POA: Diagnosis not present

## 2014-09-25 DIAGNOSIS — I517 Cardiomegaly: Secondary | ICD-10-CM | POA: Diagnosis not present

## 2014-09-25 DIAGNOSIS — I081 Rheumatic disorders of both mitral and tricuspid valves: Secondary | ICD-10-CM | POA: Diagnosis not present

## 2014-09-25 DIAGNOSIS — R911 Solitary pulmonary nodule: Secondary | ICD-10-CM | POA: Diagnosis not present

## 2014-09-25 DIAGNOSIS — I2510 Atherosclerotic heart disease of native coronary artery without angina pectoris: Secondary | ICD-10-CM | POA: Diagnosis not present

## 2014-09-25 DIAGNOSIS — I272 Other secondary pulmonary hypertension: Secondary | ICD-10-CM | POA: Diagnosis not present

## 2014-09-25 DIAGNOSIS — R0602 Shortness of breath: Secondary | ICD-10-CM | POA: Diagnosis not present

## 2014-10-08 DIAGNOSIS — I480 Paroxysmal atrial fibrillation: Secondary | ICD-10-CM | POA: Diagnosis not present

## 2014-10-13 DIAGNOSIS — R739 Hyperglycemia, unspecified: Secondary | ICD-10-CM | POA: Diagnosis not present

## 2014-10-13 DIAGNOSIS — I10 Essential (primary) hypertension: Secondary | ICD-10-CM | POA: Diagnosis not present

## 2014-10-13 DIAGNOSIS — Z1273 Encounter for screening for malignant neoplasm of ovary: Secondary | ICD-10-CM | POA: Diagnosis not present

## 2014-10-13 DIAGNOSIS — R600 Localized edema: Secondary | ICD-10-CM | POA: Diagnosis not present

## 2014-10-22 DIAGNOSIS — I4891 Unspecified atrial fibrillation: Secondary | ICD-10-CM | POA: Diagnosis not present

## 2014-10-23 DIAGNOSIS — I5032 Chronic diastolic (congestive) heart failure: Secondary | ICD-10-CM | POA: Diagnosis not present

## 2014-10-23 DIAGNOSIS — I4891 Unspecified atrial fibrillation: Secondary | ICD-10-CM | POA: Diagnosis not present

## 2014-10-23 DIAGNOSIS — I272 Other secondary pulmonary hypertension: Secondary | ICD-10-CM | POA: Diagnosis not present

## 2014-11-04 DIAGNOSIS — I4891 Unspecified atrial fibrillation: Secondary | ICD-10-CM | POA: Diagnosis not present

## 2014-11-10 DIAGNOSIS — J984 Other disorders of lung: Secondary | ICD-10-CM | POA: Diagnosis not present

## 2014-11-10 DIAGNOSIS — R938 Abnormal findings on diagnostic imaging of other specified body structures: Secondary | ICD-10-CM | POA: Diagnosis not present

## 2014-11-10 DIAGNOSIS — R609 Edema, unspecified: Secondary | ICD-10-CM | POA: Diagnosis not present

## 2014-11-10 DIAGNOSIS — I700 Atherosclerosis of aorta: Secondary | ICD-10-CM | POA: Diagnosis not present

## 2014-11-25 DIAGNOSIS — I480 Paroxysmal atrial fibrillation: Secondary | ICD-10-CM | POA: Diagnosis not present

## 2014-12-02 DIAGNOSIS — I480 Paroxysmal atrial fibrillation: Secondary | ICD-10-CM | POA: Diagnosis not present

## 2014-12-16 DIAGNOSIS — I4891 Unspecified atrial fibrillation: Secondary | ICD-10-CM | POA: Diagnosis not present

## 2015-01-06 DIAGNOSIS — M25572 Pain in left ankle and joints of left foot: Secondary | ICD-10-CM | POA: Diagnosis not present

## 2015-01-06 DIAGNOSIS — M7989 Other specified soft tissue disorders: Secondary | ICD-10-CM | POA: Diagnosis not present

## 2015-01-06 DIAGNOSIS — M19072 Primary osteoarthritis, left ankle and foot: Secondary | ICD-10-CM | POA: Diagnosis not present

## 2015-01-06 DIAGNOSIS — I480 Paroxysmal atrial fibrillation: Secondary | ICD-10-CM | POA: Diagnosis not present

## 2015-01-09 DIAGNOSIS — M19072 Primary osteoarthritis, left ankle and foot: Secondary | ICD-10-CM | POA: Diagnosis not present

## 2015-01-09 DIAGNOSIS — M7752 Other enthesopathy of left foot: Secondary | ICD-10-CM | POA: Diagnosis not present

## 2015-01-09 DIAGNOSIS — M25572 Pain in left ankle and joints of left foot: Secondary | ICD-10-CM | POA: Diagnosis not present

## 2015-01-13 DIAGNOSIS — L820 Inflamed seborrheic keratosis: Secondary | ICD-10-CM | POA: Diagnosis not present

## 2015-01-14 DIAGNOSIS — N952 Postmenopausal atrophic vaginitis: Secondary | ICD-10-CM | POA: Diagnosis not present

## 2015-01-14 DIAGNOSIS — N812 Incomplete uterovaginal prolapse: Secondary | ICD-10-CM | POA: Diagnosis not present

## 2015-01-20 DIAGNOSIS — I480 Paroxysmal atrial fibrillation: Secondary | ICD-10-CM | POA: Diagnosis not present

## 2015-01-20 DIAGNOSIS — R718 Other abnormality of red blood cells: Secondary | ICD-10-CM | POA: Diagnosis not present

## 2015-01-20 DIAGNOSIS — R938 Abnormal findings on diagnostic imaging of other specified body structures: Secondary | ICD-10-CM | POA: Diagnosis not present

## 2015-01-20 DIAGNOSIS — E780 Pure hypercholesterolemia: Secondary | ICD-10-CM | POA: Diagnosis not present

## 2015-01-20 DIAGNOSIS — I10 Essential (primary) hypertension: Secondary | ICD-10-CM | POA: Diagnosis not present

## 2015-01-30 DIAGNOSIS — M19072 Primary osteoarthritis, left ankle and foot: Secondary | ICD-10-CM | POA: Diagnosis not present

## 2015-02-03 DIAGNOSIS — M19071 Primary osteoarthritis, right ankle and foot: Secondary | ICD-10-CM | POA: Diagnosis not present

## 2015-02-03 DIAGNOSIS — I4891 Unspecified atrial fibrillation: Secondary | ICD-10-CM | POA: Diagnosis not present

## 2015-02-03 DIAGNOSIS — M25674 Stiffness of right foot, not elsewhere classified: Secondary | ICD-10-CM | POA: Diagnosis not present

## 2015-02-03 DIAGNOSIS — M79674 Pain in right toe(s): Secondary | ICD-10-CM | POA: Diagnosis not present

## 2015-02-03 DIAGNOSIS — M25571 Pain in right ankle and joints of right foot: Secondary | ICD-10-CM | POA: Diagnosis not present

## 2015-02-17 DIAGNOSIS — M79676 Pain in unspecified toe(s): Secondary | ICD-10-CM | POA: Diagnosis not present

## 2015-02-17 DIAGNOSIS — I272 Other secondary pulmonary hypertension: Secondary | ICD-10-CM | POA: Diagnosis not present

## 2015-02-17 DIAGNOSIS — N8110 Cystocele, unspecified: Secondary | ICD-10-CM | POA: Diagnosis not present

## 2015-02-17 DIAGNOSIS — E669 Obesity, unspecified: Secondary | ICD-10-CM | POA: Diagnosis not present

## 2015-02-17 DIAGNOSIS — K219 Gastro-esophageal reflux disease without esophagitis: Secondary | ICD-10-CM | POA: Diagnosis not present

## 2015-02-17 DIAGNOSIS — R609 Edema, unspecified: Secondary | ICD-10-CM | POA: Diagnosis not present

## 2015-02-17 DIAGNOSIS — Z7901 Long term (current) use of anticoagulants: Secondary | ICD-10-CM | POA: Diagnosis not present

## 2015-02-17 DIAGNOSIS — Z853 Personal history of malignant neoplasm of breast: Secondary | ICD-10-CM | POA: Diagnosis not present

## 2015-02-17 DIAGNOSIS — I4891 Unspecified atrial fibrillation: Secondary | ICD-10-CM | POA: Diagnosis not present

## 2015-03-07 DIAGNOSIS — M542 Cervicalgia: Secondary | ICD-10-CM | POA: Diagnosis not present

## 2015-03-17 DIAGNOSIS — Z7901 Long term (current) use of anticoagulants: Secondary | ICD-10-CM | POA: Diagnosis not present

## 2015-03-17 DIAGNOSIS — I4891 Unspecified atrial fibrillation: Secondary | ICD-10-CM | POA: Diagnosis not present

## 2015-03-23 DIAGNOSIS — N8110 Cystocele, unspecified: Secondary | ICD-10-CM | POA: Diagnosis not present

## 2015-03-23 DIAGNOSIS — R609 Edema, unspecified: Secondary | ICD-10-CM | POA: Diagnosis not present

## 2015-03-23 DIAGNOSIS — I4891 Unspecified atrial fibrillation: Secondary | ICD-10-CM | POA: Diagnosis not present

## 2015-03-23 DIAGNOSIS — I272 Other secondary pulmonary hypertension: Secondary | ICD-10-CM | POA: Diagnosis not present

## 2015-04-02 DIAGNOSIS — R911 Solitary pulmonary nodule: Secondary | ICD-10-CM | POA: Diagnosis not present

## 2015-04-02 DIAGNOSIS — L821 Other seborrheic keratosis: Secondary | ICD-10-CM | POA: Diagnosis not present

## 2015-04-02 DIAGNOSIS — C50911 Malignant neoplasm of unspecified site of right female breast: Secondary | ICD-10-CM | POA: Diagnosis not present

## 2015-04-02 DIAGNOSIS — Z79811 Long term (current) use of aromatase inhibitors: Secondary | ICD-10-CM | POA: Diagnosis not present

## 2015-04-02 DIAGNOSIS — H26493 Other secondary cataract, bilateral: Secondary | ICD-10-CM | POA: Diagnosis not present

## 2015-04-02 DIAGNOSIS — Z170 Estrogen receptor positive status [ER+]: Secondary | ICD-10-CM | POA: Diagnosis not present

## 2015-04-13 DIAGNOSIS — I4891 Unspecified atrial fibrillation: Secondary | ICD-10-CM | POA: Diagnosis not present

## 2015-04-13 DIAGNOSIS — R42 Dizziness and giddiness: Secondary | ICD-10-CM | POA: Diagnosis not present

## 2015-04-13 DIAGNOSIS — Z7901 Long term (current) use of anticoagulants: Secondary | ICD-10-CM | POA: Diagnosis not present

## 2015-04-13 DIAGNOSIS — C50911 Malignant neoplasm of unspecified site of right female breast: Secondary | ICD-10-CM | POA: Diagnosis not present

## 2015-05-14 DIAGNOSIS — C50911 Malignant neoplasm of unspecified site of right female breast: Secondary | ICD-10-CM | POA: Diagnosis not present

## 2015-05-15 DIAGNOSIS — I340 Nonrheumatic mitral (valve) insufficiency: Secondary | ICD-10-CM | POA: Diagnosis not present

## 2015-05-15 DIAGNOSIS — I272 Other secondary pulmonary hypertension: Secondary | ICD-10-CM | POA: Diagnosis not present

## 2015-05-15 DIAGNOSIS — R609 Edema, unspecified: Secondary | ICD-10-CM | POA: Diagnosis not present

## 2015-05-15 DIAGNOSIS — Z7901 Long term (current) use of anticoagulants: Secondary | ICD-10-CM | POA: Diagnosis not present

## 2015-05-15 DIAGNOSIS — I2510 Atherosclerotic heart disease of native coronary artery without angina pectoris: Secondary | ICD-10-CM | POA: Diagnosis not present

## 2015-05-15 DIAGNOSIS — Z23 Encounter for immunization: Secondary | ICD-10-CM | POA: Diagnosis not present

## 2015-05-15 DIAGNOSIS — I4891 Unspecified atrial fibrillation: Secondary | ICD-10-CM | POA: Diagnosis not present

## 2015-06-04 DIAGNOSIS — I4891 Unspecified atrial fibrillation: Secondary | ICD-10-CM | POA: Insufficient documentation

## 2015-06-04 DIAGNOSIS — E790 Hyperuricemia without signs of inflammatory arthritis and tophaceous disease: Secondary | ICD-10-CM | POA: Insufficient documentation

## 2015-06-04 DIAGNOSIS — I272 Pulmonary hypertension, unspecified: Secondary | ICD-10-CM | POA: Insufficient documentation

## 2015-06-04 DIAGNOSIS — M19079 Primary osteoarthritis, unspecified ankle and foot: Secondary | ICD-10-CM | POA: Insufficient documentation

## 2015-06-10 ENCOUNTER — Encounter: Payer: Self-pay | Admitting: Cardiology

## 2015-06-10 ENCOUNTER — Ambulatory Visit (INDEPENDENT_AMBULATORY_CARE_PROVIDER_SITE_OTHER): Payer: Medicare Other | Admitting: Cardiology

## 2015-06-10 VITALS — BP 138/64 | HR 64 | Ht 62.0 in | Wt 159.0 lb

## 2015-06-10 DIAGNOSIS — Z7901 Long term (current) use of anticoagulants: Secondary | ICD-10-CM | POA: Diagnosis not present

## 2015-06-10 DIAGNOSIS — Z8673 Personal history of transient ischemic attack (TIA), and cerebral infarction without residual deficits: Secondary | ICD-10-CM | POA: Insufficient documentation

## 2015-06-10 DIAGNOSIS — I272 Other secondary pulmonary hypertension: Secondary | ICD-10-CM | POA: Diagnosis not present

## 2015-06-10 DIAGNOSIS — E785 Hyperlipidemia, unspecified: Secondary | ICD-10-CM | POA: Insufficient documentation

## 2015-06-10 DIAGNOSIS — I482 Chronic atrial fibrillation, unspecified: Secondary | ICD-10-CM

## 2015-06-10 DIAGNOSIS — I2510 Atherosclerotic heart disease of native coronary artery without angina pectoris: Secondary | ICD-10-CM

## 2015-06-10 DIAGNOSIS — I10 Essential (primary) hypertension: Secondary | ICD-10-CM | POA: Insufficient documentation

## 2015-06-10 DIAGNOSIS — I2583 Coronary atherosclerosis due to lipid rich plaque: Secondary | ICD-10-CM

## 2015-06-10 DIAGNOSIS — IMO0002 Reserved for concepts with insufficient information to code with codable children: Secondary | ICD-10-CM | POA: Insufficient documentation

## 2015-06-10 DIAGNOSIS — I4821 Permanent atrial fibrillation: Secondary | ICD-10-CM | POA: Insufficient documentation

## 2015-06-10 MED ORDER — DILTIAZEM HCL 60 MG PO TABS: 60.0000 mg | ORAL_TABLET | Freq: Three times a day (TID) | ORAL | Status: DC

## 2015-06-10 MED ORDER — METOPROLOL TARTRATE 100 MG PO TABS: 100.0000 mg | ORAL_TABLET | Freq: Two times a day (BID) | ORAL | Status: DC

## 2015-06-10 NOTE — Patient Instructions (Signed)
Medication Instructions:  The current medical regimen is effective;  continue present plan and medications.  Testing/Procedures: Your physician has requested that you have an echocardiogram. Echocardiography is a painless test that uses sound waves to create images of your heart. It provides your doctor with information about the size and shape of your heart and how well your heart's chambers and valves are working. This procedure takes approximately one hour. There are no restrictions for this procedure.  Follow-Up: Follow up in 6 months with Dr. Skains.  You will receive a letter in the mail 2 months before you are due.  Please call us when you receive this letter to schedule your follow up appointment.  Thank you for choosing [facility]!!     

## 2015-06-10 NOTE — Progress Notes (Signed)
Cardiology Office Note   Date:  06/10/2015   ID:  Niana Martorana, DOB 1928-01-04, MRN [id number]  PCP:  Tawanna Solo, MD  Cardiologist:   Candee Furbish, MD       History of Present Illness: Adrienne [name] is a 81 y.o. female (I see her husband, she is new from [place]) here for evaluation of atrial fibrillation, pulmonary hypertension, coronary artery disease, mitral regurgitation on chronic anticoagulation patient of Dr. Kathyrn Lass.  Atrial fibrillation: In the past she has tried amiodarone but now has this listed under allergies. Currently under rate control strategy. Taking diltiazem, metoprolol. Had minor stroke years ago on Zometa in 2013 breast cancer. Right breast.   Coronary artery disease: Detected on catheterization in February 2013, nonobstructive. Echo on 09/2014 showed moderate mitral regurgitation.  Hyperlipidemia-assume that she has had difficulty with statins in the past given her current use of red yeast rice.  She has had prior visits with Dr. Sabra Heck for dizziness. Vertigo-like symptoms. During that visit she was concerned about cardiology referral for management of A. fib and Coumadin.  Pulmonary hypertension: been steady for yeas  LE edemaLasix and KCL.       Past Medical History  Diagnosis Date  . Afib   . Elevated uric acid in blood   . Arthritis of ankle   . Pulmonary HTN   . Long-term (current) use of anticoagulants   . Moderate mitral regurgitation   . CAD (coronary artery disease)   . Edema     Past Surgical History  Procedure Laterality Date  . Cardiac catheterization  2013  . Replacement total knee bilateral    . Gallbladder surgery    . Appendectomy    . Mastectomy Right   . Cataract extraction, bilateral       Current Outpatient Prescriptions  Medication Sig Dispense Refill  . anastrozole (ARIMIDEX) 1 MG tablet Take 1 mg by mouth daily.    Marland Kitchen CALCIUM CITRATE PO Take 630 mg by mouth 2 (two) times daily.    .  Cholecalciferol (VITAMIN D PO) Take 1,900 mg by mouth daily.     Marland Kitchen diltiazem (CARDIZEM) 60 MG tablet Take 1 tablet (60 mg total) by mouth 3 (three) times daily. 270 tablet 3  . furosemide (LASIX) 40 MG tablet Take 40 mg by mouth 3 (three) times a week.    Marland Kitchen L-Methylfolate-Algae-B12-B6 (METANX) 3-90.314-2-35 MG CAPS Take by mouth 4 (four) times a week.    . metoprolol (LOPRESSOR) 100 MG tablet Take 1 tablet (100 mg total) by mouth 2 (two) times daily. 180 tablet 3  . Multiple Vitamin (MULTIVITAMIN) tablet Take 1 tablet by mouth every Monday, Wednesday, and Friday.    Marland Kitchen omeprazole (PRILOSEC) 40 MG capsule Take 40 mg by mouth every other day.     . potassium chloride SA (K-DUR,KLOR-CON) 20 MEQ tablet Take 20 mEq by mouth 3 (three) times a week.    . Red Yeast Rice Extract (RED YEAST RICE PO) Take 600 mg by mouth 2 (two) times daily.    . vitamin C (ASCORBIC ACID) 500 MG tablet Take 500 mg by mouth 2 (two) times daily.    Marland Kitchen warfarin (COUMADIN) 4 MG tablet Take 4 mg by mouth. TAKE 4 MG ON SUN,TUE AND THURS. OTHER DAYS TAKE 5 MG    . warfarin (COUMADIN) 5 MG tablet Take 5 mg by mouth every Monday, Wednesday, Friday, and Saturday at 6 PM.     No current facility-administered medications for this visit.  Allergies:   Amiodarone hcl; Compazine; Thorazine; Valium; and Zometa    Social History:  The patient  reports that she has quit smoking. Her smoking use included Cigarettes. She does not have any smokeless tobacco history on file.   Family History:  The patient's family history includes Asthma in her mother; Atrial fibrillation in her sister; CVA in her father; Cancer in her brother and daughter; Pneumonia in her brother.    ROS:  Please see the history of present illness.   Otherwise, review of systems are positive for no bleeding, no syncope, no orthopnea, no PND, no chest pain, no new strokelike symptoms.   All other systems are reviewed and negative.    PHYSICAL EXAM: VS:  BP 138/64 mmHg   Pulse 64  Ht 5\' 2"  (1.575 m)  Wt 159 lb (72.122 kg)  BMI 29.07 kg/m2 , BMI Body mass index is 29.07 kg/(m^2). GEN: Well nourished, well developed, in no acute distress HEENT: normal Neck: no JVD, carotid bruits, or masses Cardiac: irregularly irregular;  soft systolic murmur left lower sternal border no  rubs, or gallops  Respiratory:  clear to auscultation bilaterally, normal work of breathing GI: soft, nontender, nondistended, + BS MS: no deformity or atrophy Skin: warm and dry, no rash, 2-3+ chronic lower extremity edema left greater than right with chronic venous insufficiency changes. Neuro:  Strength and sensation are intact Psych: euthymic mood, full affect   EKG: Today's EKG 06/10/15-atrial fibrillation heart rate 64, no other abnormalities. Personally viewed-prior Prior EKG from 10/29/11 shows atrial fibrillation with heart rate 110 bpm, no other abnormalities.  Recent Labs: No results found for requested labs within last 365 days.    Lipid Panel No results found for: CHOL, TRIG, HDL, CHOLHDL, VLDL, LDLCALC, LDLDIRECT    Wt Readings from Last 3 Encounters:  06/10/15 159 lb (72.122 kg)      Other studies Reviewed: Additional studies/ records that were reviewed today include: office notes, labs, EKG, echoew of the above records demonstrates: as above   ASSESSMENT AND PLAN:  1. Persistent atrial fibrillation -Since 2013. Failed amiodarone. -This patients CHA2DS2-VASc Score and unadjusted Ischemic Stroke Rate (% per year) is equal to 9.7 % stroke rate/year from a score of 6  Above score calculated as 1 point each if present [CHF, HTN, DM, Vascular=MI/PAD/Aortic Plaque, Age if 65-74, or Female] Above score calculated as 2 points each if present [Age > 75, or Stroke/TIA/TE]  -Continuing with chronic anti-coag ablation. Discussed with her the possibility of a novel oral anticoagulation. They will think about this. She has been quite stable with her Coumadin and there is no  rush for decision. -Under good rate control with heart rates between 60 and 90 mostly. Continue with metoprolol and diltiazem. She takes split dosing of this medication. we will check echo.   2. Coronary artery disease -Nonobstructive CAD, she believes normal cardiac catheterization in 2013 in [place].  3. Pulmonary hypertension -Possibly secondary pulmonary hypertension from left-sided heart disease previously. We will check echo. She states that her pulmonary pressures have been stable.  4. Hyperlipidemia -Excellent control with red yeast rice extract. LDL 48.   5.Chronic anticoagulation  -she will continue follow Dr. Kathyrn Lass will be checking INR levels on a monthly basis.   6. History of breast cancer -Prior mastectomy right-sided.  7. History of stroke -During breast cancer treatment after receiving Zometa. She is on chronic anticoagulation.  8. Chronic lower extremity edema/venous insufficiency -Lasix. Encouraged thigh-high stockings. She does not  like to wear these in the past. Stable chronic condition.  Current medicines are reviewed at length with the patient today.  The patient does not have concerns regarding medicines.  The following changes have been made:  no change  Labs/ tests ordered today include:   Orders Placed This Encounter  Procedures  . EKG 12-Lead  . Echocardiogram     Disposition:   FU with Ruvim Risko in 6 months  Signed, Candee Furbish, MD  06/10/2015 10:16 AM    [facility] [address], [place], [place]  [address] Phone: [phone]; Fax: [phone]

## 2015-06-12 DIAGNOSIS — I4891 Unspecified atrial fibrillation: Secondary | ICD-10-CM | POA: Diagnosis not present

## 2015-06-12 DIAGNOSIS — Z7901 Long term (current) use of anticoagulants: Secondary | ICD-10-CM | POA: Diagnosis not present

## 2015-06-13 DIAGNOSIS — C50911 Malignant neoplasm of unspecified site of right female breast: Secondary | ICD-10-CM | POA: Diagnosis not present

## 2015-06-18 ENCOUNTER — Other Ambulatory Visit: Payer: Self-pay

## 2015-06-18 ENCOUNTER — Ambulatory Visit (HOSPITAL_COMMUNITY): Payer: Medicare Other | Attending: Cardiology

## 2015-06-18 DIAGNOSIS — I358 Other nonrheumatic aortic valve disorders: Secondary | ICD-10-CM | POA: Diagnosis not present

## 2015-06-18 DIAGNOSIS — Z87891 Personal history of nicotine dependence: Secondary | ICD-10-CM | POA: Diagnosis not present

## 2015-06-18 DIAGNOSIS — I517 Cardiomegaly: Secondary | ICD-10-CM | POA: Diagnosis not present

## 2015-06-18 DIAGNOSIS — I059 Rheumatic mitral valve disease, unspecified: Secondary | ICD-10-CM | POA: Insufficient documentation

## 2015-06-18 DIAGNOSIS — I071 Rheumatic tricuspid insufficiency: Secondary | ICD-10-CM | POA: Diagnosis not present

## 2015-06-18 DIAGNOSIS — I4891 Unspecified atrial fibrillation: Secondary | ICD-10-CM | POA: Insufficient documentation

## 2015-06-18 DIAGNOSIS — I482 Chronic atrial fibrillation, unspecified: Secondary | ICD-10-CM

## 2015-06-24 ENCOUNTER — Telehealth: Payer: Self-pay | Admitting: Cardiology

## 2015-06-24 NOTE — Telephone Encounter (Signed)
New problem    Pt returning call concerning results for echo.

## 2015-06-24 NOTE — Telephone Encounter (Signed)
Notes Recorded by Jerline Pain, MD on 06/22/2015 at 8:04 AM EF normal. Only very minimal mitral regurgitation noted. Reassuring. Pulmonary pressures mild to moderate increased.     Called patient about echo results. Patient verbalized understanding.

## 2015-07-14 DIAGNOSIS — C50911 Malignant neoplasm of unspecified site of right female breast: Secondary | ICD-10-CM | POA: Diagnosis not present

## 2015-07-17 DIAGNOSIS — Z7901 Long term (current) use of anticoagulants: Secondary | ICD-10-CM | POA: Diagnosis not present

## 2015-07-17 DIAGNOSIS — I4891 Unspecified atrial fibrillation: Secondary | ICD-10-CM | POA: Diagnosis not present

## 2015-07-21 DIAGNOSIS — R319 Hematuria, unspecified: Secondary | ICD-10-CM | POA: Diagnosis not present

## 2015-07-21 DIAGNOSIS — Z87440 Personal history of urinary (tract) infections: Secondary | ICD-10-CM | POA: Diagnosis not present

## 2015-07-21 DIAGNOSIS — N952 Postmenopausal atrophic vaginitis: Secondary | ICD-10-CM | POA: Diagnosis not present

## 2015-07-21 DIAGNOSIS — N8110 Cystocele, unspecified: Secondary | ICD-10-CM | POA: Diagnosis not present

## 2015-07-28 DIAGNOSIS — D241 Benign neoplasm of right breast: Secondary | ICD-10-CM | POA: Diagnosis not present

## 2015-07-28 DIAGNOSIS — Z1231 Encounter for screening mammogram for malignant neoplasm of breast: Secondary | ICD-10-CM | POA: Diagnosis not present

## 2015-07-28 DIAGNOSIS — R928 Other abnormal and inconclusive findings on diagnostic imaging of breast: Secondary | ICD-10-CM | POA: Diagnosis not present

## 2015-07-28 DIAGNOSIS — D242 Benign neoplasm of left breast: Secondary | ICD-10-CM | POA: Diagnosis not present

## 2015-08-03 DIAGNOSIS — R350 Frequency of micturition: Secondary | ICD-10-CM | POA: Diagnosis not present

## 2015-08-03 DIAGNOSIS — Z7901 Long term (current) use of anticoagulants: Secondary | ICD-10-CM | POA: Diagnosis not present

## 2015-08-03 DIAGNOSIS — Z23 Encounter for immunization: Secondary | ICD-10-CM | POA: Diagnosis not present

## 2015-08-10 DIAGNOSIS — I4891 Unspecified atrial fibrillation: Secondary | ICD-10-CM | POA: Diagnosis not present

## 2015-08-10 DIAGNOSIS — Z7901 Long term (current) use of anticoagulants: Secondary | ICD-10-CM | POA: Diagnosis not present

## 2015-08-13 DIAGNOSIS — C50911 Malignant neoplasm of unspecified site of right female breast: Secondary | ICD-10-CM | POA: Diagnosis not present

## 2015-08-14 ENCOUNTER — Encounter (HOSPITAL_COMMUNITY): Payer: Self-pay | Admitting: Emergency Medicine

## 2015-08-14 ENCOUNTER — Emergency Department (HOSPITAL_COMMUNITY): Payer: Medicare Other

## 2015-08-14 ENCOUNTER — Emergency Department (HOSPITAL_COMMUNITY)
Admission: EM | Admit: 2015-08-14 | Discharge: 2015-08-14 | Disposition: A | Discharge status: Home / Self Care | Payer: Medicare Other | Attending: Emergency Medicine | Admitting: Emergency Medicine

## 2015-08-14 DIAGNOSIS — Z79899 Other long term (current) drug therapy: Secondary | ICD-10-CM | POA: Insufficient documentation

## 2015-08-14 DIAGNOSIS — Z7901 Long term (current) use of anticoagulants: Secondary | ICD-10-CM | POA: Diagnosis not present

## 2015-08-14 DIAGNOSIS — M25532 Pain in left wrist: Secondary | ICD-10-CM | POA: Diagnosis not present

## 2015-08-14 DIAGNOSIS — M7989 Other specified soft tissue disorders: Secondary | ICD-10-CM | POA: Diagnosis not present

## 2015-08-14 DIAGNOSIS — Z87891 Personal history of nicotine dependence: Secondary | ICD-10-CM | POA: Diagnosis not present

## 2015-08-14 DIAGNOSIS — I4891 Unspecified atrial fibrillation: Secondary | ICD-10-CM | POA: Diagnosis not present

## 2015-08-14 DIAGNOSIS — Z9889 Other specified postprocedural states: Secondary | ICD-10-CM | POA: Diagnosis not present

## 2015-08-14 DIAGNOSIS — I2510 Atherosclerotic heart disease of native coronary artery without angina pectoris: Secondary | ICD-10-CM | POA: Insufficient documentation

## 2015-08-14 MED ORDER — ACETAMINOPHEN 325 MG PO TABS
325.0000 mg | ORAL_TABLET | ORAL | Status: AC
  Administered 2015-08-14: 325 mg via ORAL
  Filled 2015-08-14: qty 1

## 2015-08-14 NOTE — ED Provider Notes (Signed)
CSN: [id number]     Arrival date & time 08/14/15  1602 History  By signing my name below, I, Adrienne [name], attest that this documentation has been prepared under the direction and in the presence of Nirav Sweda C. [name], PA-C. Electronically Signed: Rayna [name], ED Scribe. 08/14/2015. 4:35 PM.    Chief Complaint  Patient presents with  . Wrist Pain   The history is provided by the patient. No language interpreter was used.     HPI Comments: Adrienne [name] is a 81 y.o. female with a hx of arthritis in her bilateral thumbs who presents to the Emergency Department complaining of worsening, moderate, left wrist pain with onset this morning. Pt notes that she went to bed last night pain free and woke this morning with moderate pain that has worsened throughout the day. She notes worsening pain with any movement or exertion of the left arm, wrist, or hand. Pt notes taking 1x acetaminophen 4 hours ago which provided no relief. Pt notes associated weakness of the left hand due to pain. She denies any recent injury or falls. She denies numbness and tingling.    Past Medical History  Diagnosis Date  . Afib ([place])   . Elevated uric acid in blood   . Arthritis of ankle   . Pulmonary HTN ([place])   . Long-term (current) use of anticoagulants   . Moderate mitral regurgitation   . CAD (coronary artery disease)   . Edema    Past Surgical History  Procedure Laterality Date  . Cardiac catheterization  2013  . Replacement total knee bilateral    . Gallbladder surgery    . Appendectomy    . Mastectomy Right   . Cataract extraction, bilateral     Family History  Problem Relation Age of Onset  . Asthma Mother   . CVA Father   . Atrial fibrillation Sister     HAS PACER  . Pneumonia Brother   . Cancer Brother     BREAST CANCER  . Cancer Daughter     COLON CANCER   Social History  Substance Use Topics  . Smoking status: Former Smoker    Types: Cigarettes  . Smokeless tobacco: None  .  Alcohol Use: None   OB History    No data available      Review of Systems  Musculoskeletal: Positive for joint swelling and arthralgias.  Skin: Negative for wound.  Neurological: Positive for weakness (secondary to pain). Negative for numbness.    Allergies  Amiodarone hcl; Compazine; Thorazine; Valium; and Zometa  Home Medications   Prior to Admission medications   Medication Sig Start Date End Date Taking? Authorizing Provider  anastrozole (ARIMIDEX) 1 MG tablet Take 1 mg by mouth daily.    Historical Provider, MD  CALCIUM CITRATE PO Take 630 mg by mouth 2 (two) times daily.    Historical Provider, MD  Cholecalciferol (VITAMIN D PO) Take 1,900 mg by mouth daily.     Historical Provider, MD  diltiazem (CARDIZEM) 60 MG tablet Take 1 tablet (60 mg total) by mouth 3 (three) times daily. 06/10/15   Jerline Pain, MD  furosemide (LASIX) 40 MG tablet Take 40 mg by mouth 3 (three) times a week.    Historical Provider, MD  L-Methylfolate-Algae-B12-B6 [place]ade [place]) 3-90.314-2-35 MG CAPS Take by mouth 4 (four) times a week.    Historical Provider, MD  metoprolol (LOPRESSOR) 100 MG tablet Take 1 tablet (100 mg total) by mouth 2 (two) times daily. 06/10/15  Jerline Pain, MD  Multiple Vitamin (MULTIVITAMIN) tablet Take 1 tablet by mouth every Monday, Wednesday, and Friday.    Historical Provider, MD  omeprazole (PRILOSEC) 40 MG capsule Take 40 mg by mouth every other day.     Historical Provider, MD  potassium chloride SA (K-DUR,KLOR-CON) 20 MEQ tablet Take 20 mEq by mouth 3 (three) times a week.    Historical Provider, MD  Red Yeast Rice Extract (RED YEAST RICE PO) Take 600 mg by mouth 2 (two) times daily.    Historical Provider, MD  vitamin C (ASCORBIC ACID) 500 MG tablet Take 500 mg by mouth 2 (two) times daily.    Historical Provider, MD  warfarin (COUMADIN) 4 MG tablet Take 4 mg by mouth. TAKE 4 MG ON SUN,TUE AND THURS. OTHER DAYS TAKE 5 MG    Historical Provider, MD  warfarin (COUMADIN) 5 MG  tablet Take 5 mg by mouth every Monday, Wednesday, Friday, and Saturday at 6 PM.    Historical Provider, MD    BP 155/71 mmHg  Pulse 77  Temp(Src) 97.9 F (36.6 C) (Oral)  Resp 20  SpO2 98%  Physical Exam  Constitutional: She is oriented to person, place, and time. She appears well-developed and well-nourished. No distress.  HENT:  Head: Normocephalic and atraumatic.  Right Ear: External ear normal.  Left Ear: External ear normal.  Nose: Nose normal.  Eyes: Conjunctivae and EOM are normal. Right eye exhibits no discharge. Left eye exhibits no discharge. No scleral icterus.  Neck: Normal range of motion. Neck supple.  Cardiovascular: Normal rate and regular rhythm.   Pulmonary/Chest: Effort normal and breath sounds normal. No respiratory distress.  Musculoskeletal: Normal range of motion. She exhibits tenderness. She exhibits no edema.  Mild TTP of dorsal aspect of left wrist and hand. No significant erythema or edema. Full range of motion. Strength and sensation intact. Distal pulses intact. Cap refill <3 seconds.  Neurological: She is alert and oriented to person, place, and time.  Skin: Skin is warm and dry. She is not diaphoretic.  Psychiatric: She has a normal mood and affect. Her behavior is normal.  Nursing note and vitals reviewed.   ED Course  Procedures   DIAGNOSTIC STUDIES: Oxygen Saturation is 98% on RA, normal by my interpretation.    COORDINATION OF CARE: 4:33 PM Pt presents today due to left wrist pain. Discussed treatment plan with pt at bedside including DG of the affected region and reevaluation based on the results. Pt agreed to plan.  Labs Review Labs Reviewed - No data to display  Imaging Review Dg Forearm Left  08/14/2015  CLINICAL DATA:  Proximal forearm pain radiating into the wrist. No known injury. Swelling. EXAM: LEFT FOREARM - 2 VIEW COMPARISON:  None. FINDINGS: The bones appear mildly demineralized. There is no evidence of acute fracture or  dislocation. There is no significant elbow joint effusion or significant arthropathy at the elbow. Degenerative changes are present within the wrist, described on the separate examination. Diffuse vascular calcifications noted. IMPRESSION: No acute or significant findings in the forearm aside from diffuse vascular calcifications. Electronically Signed   By: Richardean Sale M.D.   On: 08/14/2015 17:21   Dg Wrist Complete Left  08/14/2015  CLINICAL DATA:  Proximal forearm pain radiating into the wrist. No known injury. Swelling. EXAM: LEFT WRIST - COMPLETE 3+ VIEW COMPARISON:  None. FINDINGS: The bones are diffusely demineralized. There is no evidence of acute fracture, dislocation or bone destruction. There are advanced degenerative changes  at the first [facility] joint with fragmentation of the trapezium and mild subluxation. Degenerative changes are noted at the first MCP joint. There is TFCC chondrocalcinosis and moderate atherosclerosis. IMPRESSION: No acute osseous findings demonstrated. Advanced degenerative changes at the first [facility] joint and diffuse atherosclerosis noted. Electronically Signed   By: Richardean Sale M.D.   On: 08/14/2015 17:20   Dg Hand Complete Left  08/14/2015  CLINICAL DATA:  Proximal forearm pain radiating down to MCP joints, with additional radiation to finger tips upon flexing. No known injury. Obvious swelling around posterior wrist today. Known arthritis. EXAM: LEFT HAND - COMPLETE 3+ VIEW COMPARISON:  None. FINDINGS: Three views of the left hand are provided. There is diffuse patchy osteopenia. No fracture line or displaced fracture fragment seen. No acute- appearing cortical irregularity or osseous lesion. Extensive degenerative osteophyte formation is present at the left first carpometacarpal and metacarpophalangeal joint spaces, with associated joint space narrowing and articular surface sclerosis, consistent with degenerative osteoarthritis. Milder degenerative changes noted at the  proximal and distal interphalangeal joint spaces throughout the left hand. No erosions or other obvious signs of an inflammatory arthritis. Chronic well-corticated calcifications/loose bodies noted in the expected region of the triangular fibrocartilage, compatible with previous injury. Extensive atherosclerotic vascular calcifications noted about the wrist and metacarpal bones. No acute- appearing soft tissue abnormality seen. IMPRESSION: Extensive chronic/degenerative findings detailed above. No acute findings. Electronically Signed   By: Franki Cabot M.D.   On: 08/14/2015 17:21     I have personally reviewed and evaluated these images as part of my medical decision-making.   EKG Interpretation None      MDM   Final diagnoses:  Left wrist pain    81 year old female presents with left wrist pain, which she states started this morning and has progressively worsened throughout the day. Reports decreased strength, which she attributes to pain. Denies numbness or paresthesia. Denies falls or recent injury. Patient is afebrile. Vital signs stable. Mild TTP of dorsal aspect of left wrist and hand. No significant erythema or edema. Full range of motion. Strength and sensation intact. Distal pulses intact. Cap refill <3 seconds.  Will obtain imaging of left forearm, wrist, and hand. Patient given tylenol and ice for pain. Imaging remarkable for chronic degenerative changes, no acute findings in the left hand, wrist, or forearm.  Will give wrist splint. Advised patient to rest, ice, and elevate. Recommended she take tylenol for pain. Patient to follow up with PCP early next week. Return precautions discussed. Patient verbalizes her understanding and is in agreement with plan. Patient discussed with and seen by Dr. Eulis Foster.  BP 155/71 mmHg  Pulse 77  Temp(Src) 97.9 F (36.6 C) (Oral)  Resp 20  SpO2 98%  I personally performed the services described in this documentation, which was scribed in my  presence. The recorded information has been reviewed and is accurate.    Marella Chimes, PA-C 08/14/15 1733  Daleen Bo, MD 08/14/15 2328

## 2015-08-14 NOTE — ED Notes (Signed)
Pt reports she began to have L wrist, forearm and hand pain that began upon awakening, that has gradually gotten worse. Pt denies any injury. Pain with movement

## 2015-08-14 NOTE — Discharge Instructions (Signed)
1. Medications: tylenol for pain, usual home medications 2. Treatment: rest, drink plenty of fluids, ice, elevate, wear wrist splint 3. Follow Up: please followup with your primary doctor in 2-3 days for discussion of your diagnoses and further evaluation after today's visit; please return to the ER for significant pain, swelling, numbness, weakness, new or worsening symptoms   Joint Pain Joint pain, which is also called arthralgia, can be caused by many things. Joint pain often goes away when you follow your health care provider's instructions for relieving pain at home. However, joint pain can also be caused by conditions that require further treatment. Common causes of joint pain include:  Bruising in the area of the joint.  Overuse of the joint.  Wear and tear on the joints that occur with aging (osteoarthritis).  Various other forms of arthritis.  A buildup of a crystal form of uric acid in the joint (gout).  Infections of the joint (septic arthritis) or of the bone (osteomyelitis). Your health care provider may recommend medicine to help with the pain. If your joint pain continues, additional tests may be needed to diagnose your condition. HOME CARE INSTRUCTIONS Watch your condition for any changes. Follow these instructions as directed to lessen the pain that you are feeling.  Take medicines only as directed by your health care provider.  Rest the affected area for as long as your health care provider says that you should. If directed to do so, raise the painful joint above the level of your heart while you are sitting or lying down.  Do not do things that cause or worsen pain.  If directed, apply ice to the painful area:  Put ice in a plastic bag.  Place a towel between your skin and the bag.  Leave the ice on for 20 minutes, 2-3 times per day.  Wear an elastic bandage, splint, or sling as directed by your health care provider. Loosen the elastic bandage or splint if your  fingers or toes become numb and tingle, or if they turn cold and blue.  Begin exercising or stretching the affected area as directed by your health care provider. Ask your health care provider what types of exercise are safe for you.  Keep all follow-up visits as directed by your health care provider. This is important. SEEK MEDICAL CARE IF:  Your pain increases, and medicine does not help.  Your joint pain does not improve within 3 days.  You have increased bruising or swelling.  You have a fever.  You lose 10 lb (4.5 kg) or more without trying. SEEK IMMEDIATE MEDICAL CARE IF:  You are not able to move the joint.  Your fingers or toes become numb or they turn cold and blue.   This information is not intended to replace advice given to you by your health care provider. Make sure you discuss any questions you have with your health care provider.   Document Released: 08/29/2005 Document Revised: 09/19/2014 Document Reviewed: 06/10/2014 Elsevier Interactive Patient Education [employer].

## 2015-08-14 NOTE — ED Provider Notes (Signed)
  Face-to-face evaluation   History: She will this morning with left arm pain and swelling. No known trauma. She is doing. Arthritis in the left thumb.    Physical exam: Alert, calm, cooperative, elderly female. Left dorsal and radial wrist, are swollen and slightly reddened. She is fair range of motion of the left wrist and left hand  Medical screening examination/treatment/procedure(s) were conducted as a shared visit with non-physician practitioner(s) and myself.  I personally evaluated the patient during the encounter  Daleen Bo, MD 08/14/15 2329

## 2015-08-14 NOTE — ED Notes (Signed)
Patient transported to X-ray 

## 2015-08-15 DIAGNOSIS — N3000 Acute cystitis without hematuria: Secondary | ICD-10-CM | POA: Diagnosis not present

## 2015-08-17 DIAGNOSIS — M25532 Pain in left wrist: Secondary | ICD-10-CM | POA: Diagnosis not present

## 2015-08-17 DIAGNOSIS — Z7901 Long term (current) use of anticoagulants: Secondary | ICD-10-CM | POA: Diagnosis not present

## 2015-08-19 DIAGNOSIS — M25532 Pain in left wrist: Secondary | ICD-10-CM | POA: Diagnosis not present

## 2015-08-19 DIAGNOSIS — M47816 Spondylosis without myelopathy or radiculopathy, lumbar region: Secondary | ICD-10-CM | POA: Diagnosis not present

## 2015-08-21 DIAGNOSIS — M25532 Pain in left wrist: Secondary | ICD-10-CM | POA: Diagnosis not present

## 2015-08-25 DIAGNOSIS — I4891 Unspecified atrial fibrillation: Secondary | ICD-10-CM | POA: Diagnosis not present

## 2015-08-25 DIAGNOSIS — Z7901 Long term (current) use of anticoagulants: Secondary | ICD-10-CM | POA: Diagnosis not present

## 2015-09-01 DIAGNOSIS — I4891 Unspecified atrial fibrillation: Secondary | ICD-10-CM | POA: Diagnosis not present

## 2015-09-01 DIAGNOSIS — Z7901 Long term (current) use of anticoagulants: Secondary | ICD-10-CM | POA: Diagnosis not present

## 2015-09-08 DIAGNOSIS — I4891 Unspecified atrial fibrillation: Secondary | ICD-10-CM | POA: Diagnosis not present

## 2015-09-08 DIAGNOSIS — Z7901 Long term (current) use of anticoagulants: Secondary | ICD-10-CM | POA: Diagnosis not present

## 2015-09-23 DIAGNOSIS — M79672 Pain in left foot: Secondary | ICD-10-CM | POA: Diagnosis not present

## 2015-09-24 DIAGNOSIS — Z7901 Long term (current) use of anticoagulants: Secondary | ICD-10-CM | POA: Diagnosis not present

## 2015-09-24 DIAGNOSIS — I4891 Unspecified atrial fibrillation: Secondary | ICD-10-CM | POA: Diagnosis not present

## 2015-09-25 DIAGNOSIS — M79672 Pain in left foot: Secondary | ICD-10-CM | POA: Diagnosis not present

## 2015-09-29 DIAGNOSIS — I4891 Unspecified atrial fibrillation: Secondary | ICD-10-CM | POA: Diagnosis not present

## 2015-09-29 DIAGNOSIS — Z7901 Long term (current) use of anticoagulants: Secondary | ICD-10-CM | POA: Diagnosis not present

## 2015-10-01 DIAGNOSIS — Z170 Estrogen receptor positive status [ER+]: Secondary | ICD-10-CM | POA: Diagnosis not present

## 2015-10-01 DIAGNOSIS — C50911 Malignant neoplasm of unspecified site of right female breast: Secondary | ICD-10-CM | POA: Diagnosis not present

## 2015-10-05 DIAGNOSIS — Z7901 Long term (current) use of anticoagulants: Secondary | ICD-10-CM | POA: Diagnosis not present

## 2015-10-05 DIAGNOSIS — I4891 Unspecified atrial fibrillation: Secondary | ICD-10-CM | POA: Diagnosis not present

## 2015-10-08 DIAGNOSIS — J029 Acute pharyngitis, unspecified: Secondary | ICD-10-CM | POA: Diagnosis not present

## 2015-10-13 DIAGNOSIS — J069 Acute upper respiratory infection, unspecified: Secondary | ICD-10-CM | POA: Diagnosis not present

## 2015-10-13 DIAGNOSIS — N390 Urinary tract infection, site not specified: Secondary | ICD-10-CM | POA: Diagnosis not present

## 2015-10-19 DIAGNOSIS — R338 Other retention of urine: Secondary | ICD-10-CM | POA: Diagnosis not present

## 2015-10-19 DIAGNOSIS — Z7901 Long term (current) use of anticoagulants: Secondary | ICD-10-CM | POA: Diagnosis not present

## 2015-10-19 DIAGNOSIS — N3020 Other chronic cystitis without hematuria: Secondary | ICD-10-CM | POA: Diagnosis not present

## 2015-10-19 DIAGNOSIS — N3946 Mixed incontinence: Secondary | ICD-10-CM | POA: Diagnosis not present

## 2015-10-19 DIAGNOSIS — Z0000 Encounter for general adult medical examination without abnormal findings: Secondary | ICD-10-CM | POA: Diagnosis not present

## 2015-10-19 DIAGNOSIS — I4891 Unspecified atrial fibrillation: Secondary | ICD-10-CM | POA: Diagnosis not present

## 2015-10-19 DIAGNOSIS — N8111 Cystocele, midline: Secondary | ICD-10-CM | POA: Diagnosis not present

## 2015-10-27 DIAGNOSIS — M79671 Pain in right foot: Secondary | ICD-10-CM | POA: Diagnosis not present

## 2015-11-10 DIAGNOSIS — N3946 Mixed incontinence: Secondary | ICD-10-CM | POA: Diagnosis not present

## 2015-11-10 DIAGNOSIS — Z0000 Encounter for general adult medical examination without abnormal findings: Secondary | ICD-10-CM | POA: Diagnosis not present

## 2015-11-10 DIAGNOSIS — N281 Cyst of kidney, acquired: Secondary | ICD-10-CM | POA: Diagnosis not present

## 2015-11-10 DIAGNOSIS — N390 Urinary tract infection, site not specified: Secondary | ICD-10-CM | POA: Diagnosis not present

## 2015-11-10 DIAGNOSIS — B961 Klebsiella pneumoniae [K. pneumoniae] as the cause of diseases classified elsewhere: Secondary | ICD-10-CM | POA: Diagnosis not present

## 2015-11-10 DIAGNOSIS — N3020 Other chronic cystitis without hematuria: Secondary | ICD-10-CM | POA: Diagnosis not present

## 2015-11-16 DIAGNOSIS — I4891 Unspecified atrial fibrillation: Secondary | ICD-10-CM | POA: Diagnosis not present

## 2015-11-16 DIAGNOSIS — Z7901 Long term (current) use of anticoagulants: Secondary | ICD-10-CM | POA: Diagnosis not present

## 2015-11-19 DIAGNOSIS — M109 Gout, unspecified: Secondary | ICD-10-CM | POA: Diagnosis not present

## 2015-11-20 DIAGNOSIS — M109 Gout, unspecified: Secondary | ICD-10-CM | POA: Diagnosis not present

## 2015-11-20 DIAGNOSIS — Z79899 Other long term (current) drug therapy: Secondary | ICD-10-CM | POA: Diagnosis not present

## 2015-11-24 DIAGNOSIS — Z961 Presence of intraocular lens: Secondary | ICD-10-CM | POA: Diagnosis not present

## 2015-11-24 DIAGNOSIS — Z0100 Encounter for examination of eyes and vision without abnormal findings: Secondary | ICD-10-CM | POA: Diagnosis not present

## 2015-11-24 DIAGNOSIS — N281 Cyst of kidney, acquired: Secondary | ICD-10-CM | POA: Diagnosis not present

## 2015-11-24 DIAGNOSIS — H04123 Dry eye syndrome of bilateral lacrimal glands: Secondary | ICD-10-CM | POA: Diagnosis not present

## 2015-11-24 DIAGNOSIS — N200 Calculus of kidney: Secondary | ICD-10-CM | POA: Diagnosis not present

## 2015-11-25 ENCOUNTER — Ambulatory Visit (INDEPENDENT_AMBULATORY_CARE_PROVIDER_SITE_OTHER): Payer: Medicare Other | Admitting: Obstetrics and Gynecology

## 2015-11-25 ENCOUNTER — Encounter: Payer: Self-pay | Admitting: Obstetrics and Gynecology

## 2015-11-25 VITALS — BP 142/84 | HR 66 | Resp 16 | Ht 61.0 in | Wt 158.0 lb

## 2015-11-25 DIAGNOSIS — N812 Incomplete uterovaginal prolapse: Secondary | ICD-10-CM

## 2015-11-25 DIAGNOSIS — N889 Noninflammatory disorder of cervix uteri, unspecified: Secondary | ICD-10-CM | POA: Diagnosis not present

## 2015-11-25 NOTE — Progress Notes (Signed)
Patient ID: Adrienne [name], female   DOB: 05/07/1928, 82 y.o.   MRN: [id number] GYNECOLOGY  VISIT   HPI: 82 y.o.   Married  Caucasian  female   [phone] with Patient's last menstrual period was 09/12/1973 (approximate).   here for urinary incontinence, stress incontinence and urge incontinence.  Patient was referred by Dr. Matilde Sprang to establish care for regular pessary checks.    Using pessary for 3 years.  Very happy with this.  Doing pessary change every 4 months through prior GYN in [place]. Had one episode of vaginal spotting and area of irritation noted by PCP 3 months ago with exam.   Husband has also been able to remove and replace pessary if needed. Removed it last night and brings it in today wrapped in a paper towel.  Leaks urine with cough or sneeze.  Wears a pad all the time. Has been treated for overactive bladder in the past.  Currently not on medication.  Not a caffeine user.   Has frequency UTIs.  5 - 6 per year.  Had a normal cystoscopy.  Now taking trimethoprim 100 mg daily.  Dr. Matilde Sprang prescribing.   Has urinary retention due to hinge effect from cystocele.   Renal ultrasound showing 3 - 4 mm hyperechoic area on the right kidney and 2 - 3 mm hyperechoic areas on the left kidney. Also had a 1.87 x 1.57. 1.42 cm area on the left kidney.   Has CAD, afib.  On Coumadin.   Moved from [place]. Family here.  GYNECOLOGIC HISTORY: Patient's last menstrual period was 09/12/1973 (approximate). Contraception:postmenopausal Menopausal hormone therapy: none Last mammogram: 07/2014 normal in [place], [place] Last pap smear: 5 years ago/normal per patient        OB History    Gravida Para Term Preterm AB TAB SAB Ectopic Multiple Living   3 3 3       3          Patient Active Problem List   Diagnosis Date Noted  . Chronic atrial fibrillation ([place]) 06/10/2015  . Pulmonary hypertension, secondary ([place]) 06/10/2015  . Chronic anticoagulation 06/10/2015  . Hyperlipidemia  06/10/2015  . Essential hypertension 06/10/2015  . History of stroke 06/10/2015  . Coronary artery disease due to lipid rich plaque 06/10/2015  . Afib ([place])   . Elevated uric acid in blood   . Arthritis of ankle   . Pulmonary HTN ([place])     Past Medical History  Diagnosis Date  . Afib ([place])   . Elevated uric acid in blood   . Arthritis of ankle   . Pulmonary HTN ([place])   . Long-term (current) use of anticoagulants   . Moderate mitral regurgitation   . CAD (coronary artery disease)   . Edema   . Cancer [facility]) 2012    breast cancer-right  . Hypertension   . Urinary incontinence   . TIA (transient ischemic attack) 09/2011    Past Surgical History  Procedure Laterality Date  . Cardiac catheterization  2013  . Replacement total knee bilateral    . Gallbladder surgery    . Appendectomy    . Mastectomy Right   . Cataract extraction, bilateral    . Breast surgery  2012    Rt.mastectomy--[place]  . Tonsillectomy and adenoidectomy      as a child  . Dilation and curettage of uterus      in her early 61's for DUB    Current Outpatient Prescriptions  Medication Sig Dispense Refill  .  anastrozole (ARIMIDEX) 1 MG tablet Take 1 mg by mouth daily.    Marland Kitchen CALCIUM CITRATE PO Take 630 mg by mouth 2 (two) times daily.    . Cholecalciferol (VITAMIN D PO) Take 1,900 mg by mouth daily.     Marland Kitchen diltiazem (CARDIZEM) 60 MG tablet Take 1 tablet (60 mg total) by mouth 3 (three) times daily. 270 tablet 3  . L-Methylfolate-Algae-B12-B6 (METANX) 3-90.314-2-35 MG CAPS Take by mouth 4 (four) times a week.    . metoprolol (LOPRESSOR) 100 MG tablet Take 1 tablet (100 mg total) by mouth 2 (two) times daily. 180 tablet 3  . Multiple Vitamin (MULTIVITAMIN) tablet Take 1 tablet by mouth every Monday, Wednesday, and Friday.    Marland Kitchen omeprazole (PRILOSEC) 40 MG capsule Take 40 mg by mouth every other day.     . Red Yeast Rice Extract (RED YEAST RICE PO) Take 600 mg by mouth 2 (two) times daily.    Marland Kitchen  trimethoprim (TRIMPEX) 100 MG tablet Take 1 tablet by mouth 3 (three) times daily.  11  . vitamin C (ASCORBIC ACID) 500 MG tablet Take 500 mg by mouth 2 (two) times daily.    Marland Kitchen warfarin (COUMADIN) 4 MG tablet Take 4 mg by mouth. TAKE 4 MG ON SUN,TUE AND THURS. OTHER DAYS TAKE 5 MG    . warfarin (COUMADIN) 5 MG tablet Take 5 mg by mouth every Monday, Wednesday, Friday, and Saturday at 6 PM.    . furosemide (LASIX) 40 MG tablet Take 40 mg by mouth 3 (three) times a week. Reported on 11/25/2015    . potassium chloride SA (K-DUR,KLOR-CON) 20 MEQ tablet Take 20 mEq by mouth 3 (three) times a week. Reported on 11/25/2015     No current facility-administered medications for this visit.     ALLERGIES: Amiodarone hcl; Compazine; Other; Thorazine; Valium; and Zometa  Family History  Problem Relation Age of Onset  . Asthma Mother   . CVA Father   . Atrial fibrillation Sister     HAS PACER  . Pneumonia Brother   . Cancer Brother     BREAST CANCER  . Cancer Daughter     COLON CANCER    Social History   Social History  . Marital Status: Married    Spouse Name: N/A  . Number of Children: N/A  . Years of Education: N/A   Occupational History  . Not on file.   Social History Main Topics  . Smoking status: Former Smoker    Types: Cigarettes  . Smokeless tobacco: Not on file  . Alcohol Use: 0.0 oz/week    0 Standard drinks or equivalent per week     Comment: rarely--only on special occasions  . Drug Use: No  . Sexual Activity:    Partners: Male    Birth Control/ Protection: Post-menopausal     Comment: IN COLLEGE   Other Topics Concern  . Not on file   Social History Narrative    ROS:  Pertinent items are noted in HPI.  PHYSICAL EXAMINATION:    BP 142/84 mmHg  Pulse 66  Resp 16  Ht 5\' 1"  (1.549 m)  Wt 158 lb (71.668 kg)  BMI 29.87 kg/m2  LMP 09/12/1973 (Approximate)    General appearance: alert, cooperative and appears stated age Head: Normocephalic, without obvious  abnormality, atraumatic   Lungs: clear to auscultation bilaterally Heart: regular rate and rhythm Abdomen: soft, non-tender; bowel sounds normal; no masses,  no organomegaly Extremities: bilateral LE edema and varicosities.  Skin: Skin color, texture, turgor normal. No rashes or lesions No abnormal inguinal nodes palpate   Pelvic: External genitalia:  no lesions              Urethra:  normal appearing urethra with no masses, tenderness or lesions              Bartholins and Skenes: normal                 Vagina: normal appearing vagina with normal color and discharge, no lesions.  Mucosa without ulceration or inflammation.               Cervix: erythema of the cervix at 4:00.   Third degree cystocele, first degree uterine prolapse, first degree rectocele.               Pap taken: Yes.   Mild bleeding with use of cytobrush. Bimanual Exam:  Uterus:  normal size, contour, position, consistency, mobility, non-tender              Adnexa: normal adnexa and no mass, fullness, tenderness             Ring pessary with support cleansed and placed in vagina for patient.   Chaperone was present for exam.  ASSESSMENT  Incomplete uterovaginal prolapse.   Pessary use. Doing well overall with pessary. Mild erythema of the cervix which I suspect is a contact point with the pessary.  Hx of breast cancer. Status post right mastectomy.  On Anastrozole.   Care through oncology in [place]. Dr. Truman Hayward. Hx Afib.  On coumadin.  Frequent UTIs.  PLAN  Counseled regarding area of erythema of the cervix.  Pap is taken to include this area and the endocervix.  Continue current frequency of removal of the pessary at home.  Return for pessary care again in 6 months and re-evaluation of prolapse.  Continue Trimethoprim.  An After Visit Summary was printed and given to the patient.  ___30___ minutes face to face time of which over 50% was spent in counseling.   After visit summary to patient.

## 2015-11-30 LAB — IPS PAP SMEAR ONLY

## 2015-12-09 DIAGNOSIS — Z0000 Encounter for general adult medical examination without abnormal findings: Secondary | ICD-10-CM | POA: Diagnosis not present

## 2015-12-09 DIAGNOSIS — N3020 Other chronic cystitis without hematuria: Secondary | ICD-10-CM | POA: Diagnosis not present

## 2015-12-09 DIAGNOSIS — N281 Cyst of kidney, acquired: Secondary | ICD-10-CM | POA: Diagnosis not present

## 2015-12-10 DIAGNOSIS — Z87440 Personal history of urinary (tract) infections: Secondary | ICD-10-CM | POA: Diagnosis not present

## 2015-12-10 DIAGNOSIS — M545 Low back pain: Secondary | ICD-10-CM | POA: Diagnosis not present

## 2015-12-10 DIAGNOSIS — R8290 Unspecified abnormal findings in urine: Secondary | ICD-10-CM | POA: Diagnosis not present

## 2015-12-12 DIAGNOSIS — K922 Gastrointestinal hemorrhage, unspecified: Secondary | ICD-10-CM

## 2015-12-12 HISTORY — DX: Gastrointestinal hemorrhage, unspecified: K92.2

## 2015-12-14 ENCOUNTER — Encounter: Payer: Self-pay | Admitting: Cardiology

## 2015-12-14 ENCOUNTER — Ambulatory Visit (INDEPENDENT_AMBULATORY_CARE_PROVIDER_SITE_OTHER): Payer: Medicare Other | Admitting: Cardiology

## 2015-12-14 VITALS — BP 124/80 | HR 78 | Ht 62.0 in | Wt 155.4 lb

## 2015-12-14 DIAGNOSIS — Z7901 Long term (current) use of anticoagulants: Secondary | ICD-10-CM

## 2015-12-14 DIAGNOSIS — I2510 Atherosclerotic heart disease of native coronary artery without angina pectoris: Secondary | ICD-10-CM | POA: Diagnosis not present

## 2015-12-14 DIAGNOSIS — I482 Chronic atrial fibrillation, unspecified: Secondary | ICD-10-CM

## 2015-12-14 DIAGNOSIS — I2583 Coronary atherosclerosis due to lipid rich plaque: Secondary | ICD-10-CM

## 2015-12-14 DIAGNOSIS — E785 Hyperlipidemia, unspecified: Secondary | ICD-10-CM | POA: Diagnosis not present

## 2015-12-14 DIAGNOSIS — I10 Essential (primary) hypertension: Secondary | ICD-10-CM | POA: Diagnosis not present

## 2015-12-14 NOTE — Progress Notes (Signed)
Cardiology Office Note   Date:  12/14/2015   ID:  Adrienne [name], DOB 1928/08/19, MRN [id number]  PCP:  Tawanna Solo, MD  Cardiologist:   Candee Furbish, MD       History of Present Illness: Adrienne [name] is a 82 y.o. female (I see her husband, she is new from [place]) here for evaluation of atrial fibrillation, pulmonary hypertension, coronary artery disease, mitral regurgitation on chronic anticoagulation patient of Dr. Kathyrn Lass.  Atrial fibrillation: In the past she has tried amiodarone but now has this listed under allergies. Currently under rate control strategy. Taking diltiazem, metoprolol. Had minor stroke years ago on Zometa in 2013 breast cancer. Right breast.   Coronary artery disease: Detected on catheterization in February 2013, nonobstructive. Echo on 09/2014 showed moderate mitral regurgitation.  Hyperlipidemia-assume that she has had difficulty with statins in the past given her current use of red yeast rice.  She has had prior visits with Dr. Sabra Heck for dizziness. Vertigo-like symptoms. During that visit she was concerned about cardiology referral for management of A. fib and Coumadin.  Pulmonary hypertension: been steady for years. In fact last echocardiogram reassuring with no significant elevation pressures.  LE edemaLasix and KCL.   12/14/15 - abdominal pain and back pain.  She has stopped her Lasix because of gout. Overall her lower extremity edema is unchanged/slightly improved. No chest pain, no syncope.      Past Medical History  Diagnosis Date  . Afib ([place])   . Elevated uric acid in blood   . Arthritis of ankle   . Pulmonary HTN ([place])   . Long-term (current) use of anticoagulants   . Moderate mitral regurgitation   . CAD (coronary artery disease)   . Edema   . Cancer [facility]) 2012    breast cancer-right  . Hypertension   . Urinary incontinence   . TIA (transient ischemic attack) 09/2011    Past Surgical History  Procedure Laterality  Date  . Cardiac catheterization  2013  . Replacement total knee bilateral    . Gallbladder surgery    . Appendectomy    . Mastectomy Right   . Cataract extraction, bilateral    . Breast surgery  2012    Rt.mastectomy--[place], [place]  . Tonsillectomy and adenoidectomy      as a child  . Dilation and curettage of uterus      in her early 89's for DUB     Current Outpatient Prescriptions  Medication Sig Dispense Refill  . anastrozole (ARIMIDEX) 1 MG tablet Take 1 mg by mouth daily.    Marland Kitchen CALCIUM CITRATE PO Take 630 mg by mouth 2 (two) times daily.    . Cholecalciferol (VITAMIN D PO) Take 1,900 mg by mouth daily.     Marland Kitchen diltiazem (CARDIZEM) 60 MG tablet Take 1 tablet (60 mg total) by mouth 3 (three) times daily. 270 tablet 3  . metoprolol (LOPRESSOR) 100 MG tablet Take 1 tablet (100 mg total) by mouth 2 (two) times daily. 180 tablet 3  . Multiple Vitamin (MULTIVITAMIN) tablet Take 1 tablet by mouth daily.     Marland Kitchen omeprazole (PRILOSEC) 40 MG capsule Take 40 mg by mouth every other day.     . Red Yeast Rice Extract (RED YEAST RICE PO) Take 600 mg by mouth 2 (two) times daily.    Marland Kitchen trimethoprim (TRIMPEX) 100 MG tablet Take 1 tablet by mouth daily.   11  . vitamin C (ASCORBIC ACID) 500 MG tablet Take 500 mg  by mouth 2 (two) times daily.    Marland Kitchen warfarin (COUMADIN) 4 MG tablet Take 4 mg by mouth. TAKE 4 MG ON SUN,TUE AND THURS. OTHER DAYS TAKE 5 MG    . warfarin (COUMADIN) 5 MG tablet Take 5 mg by mouth every Monday, Wednesday, Friday, and Saturday at 6 PM.     No current facility-administered medications for this visit.    Allergies:   Amiodarone hcl; Compazine; Other; Thorazine; Valium; and Zometa    Social History:  The patient  reports that she has quit smoking. Her smoking use included Cigarettes. She does not have any smokeless tobacco history on file. She reports that she drinks alcohol. She reports that she does not use illicit drugs.   Family History:  The patient's family history  includes Asthma in her mother; Atrial fibrillation in her sister; CVA in her father; Cancer in her brother and daughter; Pneumonia in her brother.    ROS:  Please see the history of present illness.   Otherwise, review of systems are positive for no bleeding, no syncope, no orthopnea, no PND, no chest pain, no new strokelike symptoms.   All other systems are reviewed and negative.    PHYSICAL EXAM: VS:  BP 124/80 mmHg  Pulse 78  Ht 5\' 2"  (1.575 m)  Wt 155 lb 6.4 oz (70.489 kg)  BMI 28.42 kg/m2  LMP 09/12/1973 (Approximate) , BMI Body mass index is 28.42 kg/(m^2). GEN: Well nourished, well developed, in no acute distress HEENT: normal Neck: no JVD, carotid bruits, or masses Cardiac: irregularly irregular;  soft systolic murmur left lower sternal border no  rubs, or gallops  Respiratory:  clear to auscultation bilaterally, normal work of breathing GI: soft, nontender, nondistended, + BS MS: no deformity or atrophy Skin: warm and dry, no rash, 2-3+ chronic lower extremity edema left greater than right with chronic venous insufficiency changes. Neuro:  Strength and sensation are intact Psych: euthymic mood, full affect   EKG: Today's EKG 06/10/15-atrial fibrillation heart rate 64, no other abnormalities. Personally viewed-prior Prior EKG from 10/29/11 shows atrial fibrillation with heart rate 110 bpm, no other abnormalities.  ECHO: 06/18/15  - Left ventricle: The cavity size was mildly dilated. Systolic  function was normal. The estimated ejection fraction was in the  range of 55% to 60%. Wall motion was normal; there were no  regional wall motion abnormalities. - Aortic valve: Moderately calcified annulus. Trileaflet; normal  thickness, mildly calcified leaflets. Mobility was not  restricted. - Mitral valve: Calcified annulus. - Right ventricle: The cavity size was normal. Wall thickness was  normal. Systolic function was normal. - Tricuspid valve: There was mild  regurgitation. - Pulmonic valve: Transvalvular velocity was within the normal  range. There was no evidence for stenosis. - Inferior vena cava: The vessel was dilated. The respirophasic  diameter changes were in the normal range (>= 50%), consistent  with elevated central venous pressure.  Recent Labs: No results found for requested labs within last 365 days.    Lipid Panel No results found for: CHOL, TRIG, HDL, CHOLHDL, VLDL, LDLCALC, LDLDIRECT    Wt Readings from Last 3 Encounters:  12/14/15 155 lb 6.4 oz (70.489 kg)  11/25/15 158 lb (71.668 kg)  06/10/15 159 lb (72.122 kg)      Other studies Reviewed: Additional studies/ records that were reviewed today include: office notes, labs, EKG, echoew of the above records demonstrates: as above   ASSESSMENT AND PLAN:  1. Persistent atrial fibrillation -Since 2013. Failed amiodarone. -  This patients CHA2DS2-VASc Score and unadjusted Ischemic Stroke Rate (% per year) is equal to 9.7 % stroke rate/year from a score of 6  Above score calculated as 1 point each if present [CHF, HTN, DM, Vascular=MI/PAD/Aortic Plaque, Age if 65-74, or Female] Above score calculated as 2 points each if present [Age > 75, or Stroke/TIA/TE]  -Continuing with chronic anti-coagblation. Discussed with her the possibility of a novel oral anticoagulation. They will think about this. She has been quite stable with her Coumadin and there is no rush for decision. -Under good rate control with heart rates between 60 and 90 mostly. Continue with metoprolol and diltiazem. She takes split dosing of this medication.  2. Coronary artery disease -Nonobstructive CAD, she believes normal cardiac catheterization in 2013 in [place].  3. Pulmonary hypertension -Possibly secondary pulmonary hypertension from left-sided heart disease previously.  She states that her pulmonary pressures have been stable. In fact, last echocardiogram on 06/18/15 was reassuring , normal  values.  4. Hyperlipidemia -Excellent control with red yeast rice extract. LDL 48.   5.Chronic anticoagulation  -she will continue follow Dr. Kathyrn Lass will be checking INR levels on a monthly basis.   6. History of breast cancer -Prior mastectomy right-sided.  7. History of stroke -During breast cancer treatment after receiving Zometa. She is on chronic anticoagulation.  8. Chronic lower extremity edema/venous insufficiency -Lasix,  Now off with 3 gout attacks.  Encouraged thigh-high stockings. She does not like to wear these in the past. Stable chronic condition.  Current medicines are reviewed at length with the patient today.  The patient does not have concerns regarding medicines.  The following changes have been made:  no change  Labs/ tests ordered today include:   No orders of the defined types were placed in this encounter.     Disposition:   FU with Alianis Trimmer in 6 months  Signed, Candee Furbish, MD  12/14/2015 2:35 PM    [facility] [address], [place], [place]  [address] Phone: [phone]; Fax: [phone]

## 2015-12-14 NOTE — Patient Instructions (Signed)
Medication Instructions:  The current medical regimen is effective;  continue present plan and medications.  Follow-Up: Follow up in 6 months with Dr. Marlou Porch.  You will receive a letter in the mail 2 months before you are due.  Please call us when you receive this letter to schedule your follow up appointment.  You had normal heart pumping function on your Echocardiogram.  If you need a refill on your cardiac medications before your next appointment, please call your pharmacy.  Thank you for choosing [facility]!!

## 2015-12-15 DIAGNOSIS — R1084 Generalized abdominal pain: Secondary | ICD-10-CM | POA: Diagnosis not present

## 2015-12-15 DIAGNOSIS — Z7901 Long term (current) use of anticoagulants: Secondary | ICD-10-CM | POA: Diagnosis not present

## 2015-12-18 DIAGNOSIS — R1084 Generalized abdominal pain: Secondary | ICD-10-CM | POA: Diagnosis not present

## 2015-12-24 ENCOUNTER — Other Ambulatory Visit: Payer: Self-pay | Admitting: Family Medicine

## 2015-12-24 DIAGNOSIS — R109 Unspecified abdominal pain: Secondary | ICD-10-CM

## 2015-12-24 DIAGNOSIS — M545 Low back pain: Secondary | ICD-10-CM | POA: Diagnosis not present

## 2015-12-24 DIAGNOSIS — K5792 Diverticulitis of intestine, part unspecified, without perforation or abscess without bleeding: Secondary | ICD-10-CM

## 2015-12-25 ENCOUNTER — Ambulatory Visit
Admission: RE | Admit: 2015-12-25 | Discharge: 2015-12-25 | Disposition: A | Discharge status: Home / Self Care | Payer: Medicare Other | Source: Ambulatory Visit | Attending: Family Medicine | Admitting: Family Medicine

## 2015-12-25 DIAGNOSIS — R109 Unspecified abdominal pain: Secondary | ICD-10-CM

## 2015-12-25 DIAGNOSIS — K5730 Diverticulosis of large intestine without perforation or abscess without bleeding: Secondary | ICD-10-CM | POA: Diagnosis not present

## 2015-12-25 DIAGNOSIS — K5792 Diverticulitis of intestine, part unspecified, without perforation or abscess without bleeding: Secondary | ICD-10-CM

## 2015-12-25 MED ORDER — IOPAMIDOL (ISOVUE-300) INJECTION 61%
100.0000 mL | Freq: Once | INTRAVENOUS | Status: AC | PRN
  Administered 2015-12-25: 100 mL via INTRAVENOUS

## 2015-12-28 ENCOUNTER — Other Ambulatory Visit: Payer: Self-pay | Admitting: Family Medicine

## 2015-12-28 DIAGNOSIS — M545 Low back pain, unspecified: Secondary | ICD-10-CM

## 2015-12-30 DIAGNOSIS — M109 Gout, unspecified: Secondary | ICD-10-CM | POA: Diagnosis not present

## 2015-12-30 DIAGNOSIS — Z7901 Long term (current) use of anticoagulants: Secondary | ICD-10-CM | POA: Diagnosis not present

## 2015-12-30 DIAGNOSIS — R300 Dysuria: Secondary | ICD-10-CM | POA: Diagnosis not present

## 2016-01-02 ENCOUNTER — Ambulatory Visit
Admission: RE | Admit: 2016-01-02 | Discharge: 2016-01-02 | Disposition: A | Discharge status: Home / Self Care | Payer: Medicare Other | Source: Ambulatory Visit | Attending: Family Medicine | Admitting: Family Medicine

## 2016-01-02 DIAGNOSIS — M545 Low back pain, unspecified: Secondary | ICD-10-CM

## 2016-01-02 DIAGNOSIS — M5126 Other intervertebral disc displacement, lumbar region: Secondary | ICD-10-CM | POA: Diagnosis not present

## 2016-01-06 ENCOUNTER — Encounter (HOSPITAL_COMMUNITY): Payer: Self-pay

## 2016-01-06 ENCOUNTER — Inpatient Hospital Stay (HOSPITAL_COMMUNITY)
Admission: EM | Admit: 2016-01-06 | Discharge: 2016-01-10 | DRG: 378 | Disposition: A | Discharge status: Home / Self Care | Payer: Medicare Other | Attending: Internal Medicine | Admitting: Internal Medicine

## 2016-01-06 DIAGNOSIS — I4891 Unspecified atrial fibrillation: Secondary | ICD-10-CM | POA: Diagnosis present

## 2016-01-06 DIAGNOSIS — K921 Melena: Secondary | ICD-10-CM | POA: Diagnosis present

## 2016-01-06 DIAGNOSIS — D62 Acute posthemorrhagic anemia: Secondary | ICD-10-CM | POA: Diagnosis present

## 2016-01-06 DIAGNOSIS — Z800 Family history of malignant neoplasm of digestive organs: Secondary | ICD-10-CM | POA: Diagnosis not present

## 2016-01-06 DIAGNOSIS — D689 Coagulation defect, unspecified: Secondary | ICD-10-CM | POA: Diagnosis present

## 2016-01-06 DIAGNOSIS — Z8673 Personal history of transient ischemic attack (TIA), and cerebral infarction without residual deficits: Secondary | ICD-10-CM | POA: Diagnosis not present

## 2016-01-06 DIAGNOSIS — Z853 Personal history of malignant neoplasm of breast: Secondary | ICD-10-CM | POA: Diagnosis not present

## 2016-01-06 DIAGNOSIS — M19079 Primary osteoarthritis, unspecified ankle and foot: Secondary | ICD-10-CM | POA: Diagnosis present

## 2016-01-06 DIAGNOSIS — I10 Essential (primary) hypertension: Secondary | ICD-10-CM | POA: Diagnosis not present

## 2016-01-06 DIAGNOSIS — Z9841 Cataract extraction status, right eye: Secondary | ICD-10-CM | POA: Diagnosis not present

## 2016-01-06 DIAGNOSIS — D649 Anemia, unspecified: Secondary | ICD-10-CM | POA: Diagnosis not present

## 2016-01-06 DIAGNOSIS — Z91041 Radiographic dye allergy status: Secondary | ICD-10-CM

## 2016-01-06 DIAGNOSIS — M4854XA Collapsed vertebra, not elsewhere classified, thoracic region, initial encounter for fracture: Secondary | ICD-10-CM | POA: Diagnosis present

## 2016-01-06 DIAGNOSIS — E785 Hyperlipidemia, unspecified: Secondary | ICD-10-CM | POA: Diagnosis present

## 2016-01-06 DIAGNOSIS — K5731 Diverticulosis of large intestine without perforation or abscess with bleeding: Secondary | ICD-10-CM | POA: Diagnosis present

## 2016-01-06 DIAGNOSIS — Z825 Family history of asthma and other chronic lower respiratory diseases: Secondary | ICD-10-CM

## 2016-01-06 DIAGNOSIS — K5710 Diverticulosis of small intestine without perforation or abscess without bleeding: Secondary | ICD-10-CM | POA: Diagnosis present

## 2016-01-06 DIAGNOSIS — K2970 Gastritis, unspecified, without bleeding: Secondary | ICD-10-CM | POA: Diagnosis present

## 2016-01-06 DIAGNOSIS — I340 Nonrheumatic mitral (valve) insufficiency: Secondary | ICD-10-CM | POA: Diagnosis present

## 2016-01-06 DIAGNOSIS — Z803 Family history of malignant neoplasm of breast: Secondary | ICD-10-CM | POA: Diagnosis not present

## 2016-01-06 DIAGNOSIS — I272 Other secondary pulmonary hypertension: Secondary | ICD-10-CM | POA: Diagnosis present

## 2016-01-06 DIAGNOSIS — K922 Gastrointestinal hemorrhage, unspecified: Secondary | ICD-10-CM | POA: Diagnosis present

## 2016-01-06 DIAGNOSIS — Z7901 Long term (current) use of anticoagulants: Secondary | ICD-10-CM | POA: Diagnosis not present

## 2016-01-06 DIAGNOSIS — Z79899 Other long term (current) drug therapy: Secondary | ICD-10-CM

## 2016-01-06 DIAGNOSIS — Z87891 Personal history of nicotine dependence: Secondary | ICD-10-CM | POA: Diagnosis not present

## 2016-01-06 DIAGNOSIS — Z96653 Presence of artificial knee joint, bilateral: Secondary | ICD-10-CM | POA: Diagnosis present

## 2016-01-06 DIAGNOSIS — Z888 Allergy status to other drugs, medicaments and biological substances status: Secondary | ICD-10-CM

## 2016-01-06 DIAGNOSIS — Z823 Family history of stroke: Secondary | ICD-10-CM

## 2016-01-06 DIAGNOSIS — K5793 Diverticulitis of intestine, part unspecified, without perforation or abscess with bleeding: Secondary | ICD-10-CM | POA: Diagnosis not present

## 2016-01-06 DIAGNOSIS — I482 Chronic atrial fibrillation, unspecified: Secondary | ICD-10-CM | POA: Diagnosis present

## 2016-01-06 DIAGNOSIS — Z8711 Personal history of peptic ulcer disease: Secondary | ICD-10-CM

## 2016-01-06 DIAGNOSIS — IMO0002 Reserved for concepts with insufficient information to code with codable children: Secondary | ICD-10-CM | POA: Diagnosis present

## 2016-01-06 DIAGNOSIS — K449 Diaphragmatic hernia without obstruction or gangrene: Secondary | ICD-10-CM | POA: Diagnosis present

## 2016-01-06 DIAGNOSIS — I2510 Atherosclerotic heart disease of native coronary artery without angina pectoris: Secondary | ICD-10-CM | POA: Diagnosis present

## 2016-01-06 DIAGNOSIS — E440 Moderate protein-calorie malnutrition: Secondary | ICD-10-CM | POA: Diagnosis present

## 2016-01-06 DIAGNOSIS — I69354 Hemiplegia and hemiparesis following cerebral infarction affecting left non-dominant side: Secondary | ICD-10-CM | POA: Diagnosis not present

## 2016-01-06 DIAGNOSIS — K5791 Diverticulosis of intestine, part unspecified, without perforation or abscess with bleeding: Secondary | ICD-10-CM

## 2016-01-06 DIAGNOSIS — Z9842 Cataract extraction status, left eye: Secondary | ICD-10-CM | POA: Diagnosis not present

## 2016-01-06 DIAGNOSIS — I4821 Permanent atrial fibrillation: Secondary | ICD-10-CM | POA: Diagnosis present

## 2016-01-06 DIAGNOSIS — R1084 Generalized abdominal pain: Secondary | ICD-10-CM | POA: Diagnosis not present

## 2016-01-06 DIAGNOSIS — K625 Hemorrhage of anus and rectum: Secondary | ICD-10-CM | POA: Diagnosis not present

## 2016-01-06 DIAGNOSIS — Z8719 Personal history of other diseases of the digestive system: Secondary | ICD-10-CM | POA: Diagnosis not present

## 2016-01-06 HISTORY — DX: Diverticulosis of intestine, part unspecified, without perforation or abscess without bleeding: K57.90

## 2016-01-06 HISTORY — DX: Wedge compression fracture of T11-T12 vertebra, initial encounter for closed fracture: S22.080A

## 2016-01-06 LAB — URINALYSIS, ROUTINE W REFLEX MICROSCOPIC
BILIRUBIN URINE: NEGATIVE
GLUCOSE, UA: NEGATIVE mg/dL
Hgb urine dipstick: NEGATIVE
KETONES UR: NEGATIVE mg/dL
Nitrite: NEGATIVE
PH: 6.5 (ref 5.0–8.0)
PROTEIN: NEGATIVE mg/dL
Specific Gravity, Urine: 1.011 (ref 1.005–1.030)

## 2016-01-06 LAB — CBC
HEMATOCRIT: 33.7 % — AB (ref 36.0–46.0)
HEMOGLOBIN: 11.1 g/dL — AB (ref 12.0–15.0)
MCH: 29.6 pg (ref 26.0–34.0)
MCHC: 32.9 g/dL (ref 30.0–36.0)
MCV: 89.9 fL (ref 78.0–100.0)
Platelets: 302 10*3/uL (ref 150–400)
RBC: 3.75 MIL/uL — AB (ref 3.87–5.11)
RDW: 14.2 % (ref 11.5–15.5)
WBC: 7.3 10*3/uL (ref 4.0–10.5)

## 2016-01-06 LAB — COMPREHENSIVE METABOLIC PANEL
ALBUMIN: 4 g/dL (ref 3.5–5.0)
ALK PHOS: 85 U/L (ref 38–126)
ALT: 17 U/L (ref 14–54)
ANION GAP: 10 (ref 5–15)
AST: 17 U/L (ref 15–41)
BUN: 23 mg/dL — ABNORMAL HIGH (ref 6–20)
CALCIUM: 9.6 mg/dL (ref 8.9–10.3)
CO2: 26 mmol/L (ref 22–32)
Chloride: 106 mmol/L (ref 101–111)
Creatinine, Ser: 0.83 mg/dL (ref 0.44–1.00)
GFR calc non Af Amer: >60 mL/min (ref >60)
GLUCOSE: 108 mg/dL — AB (ref 65–99)
POTASSIUM: 4.2 mmol/L (ref 3.5–5.1)
SODIUM: 142 mmol/L (ref 135–145)
TOTAL PROTEIN: 7 g/dL (ref 6.5–8.1)
Total Bilirubin: 0.4 mg/dL (ref 0.3–1.2)

## 2016-01-06 LAB — URINE MICROSCOPIC-ADD ON: RBC / HPF: NONE SEEN RBC/hpf (ref 0–5)

## 2016-01-06 LAB — PROTIME-INR
INR: 2.5 — AB (ref 0.00–1.49)
PROTHROMBIN TIME: 26.6 s — AB (ref 11.6–15.2)

## 2016-01-06 LAB — LIPASE, BLOOD: LIPASE: 38 U/L (ref 11–51)

## 2016-01-06 LAB — ABO/RH: ABO/RH(D): B POS

## 2016-01-06 MED ORDER — OXYCODONE HCL 5 MG PO TABS: 5.0000 mg | ORAL_TABLET | ORAL | Status: DC | PRN

## 2016-01-06 MED ORDER — SODIUM CHLORIDE 0.9% FLUSH
3.0000 mL | Freq: Two times a day (BID) | INTRAVENOUS | Status: DC
  Administered 2016-01-07 – 2016-01-10 (×3): 3 mL via INTRAVENOUS

## 2016-01-06 MED ORDER — SODIUM CHLORIDE 0.9 % IV SOLN
80.0000 mg | Freq: Once | INTRAVENOUS | Status: AC
  Administered 2016-01-06: 80 mg via INTRAVENOUS
  Filled 2016-01-06: qty 80

## 2016-01-06 MED ORDER — PANTOPRAZOLE SODIUM 40 MG IV SOLR: 40.0000 mg | Freq: Two times a day (BID) | INTRAVENOUS | Status: DC

## 2016-01-06 MED ORDER — ANASTROZOLE 1 MG PO TABS
1.0000 mg | ORAL_TABLET | Freq: Every day | ORAL | Status: DC
  Administered 2016-01-06 – 2016-01-10 (×5): 1 mg via ORAL
  Filled 2016-01-06 (×5): qty 1

## 2016-01-06 MED ORDER — HYDROCODONE-ACETAMINOPHEN 5-325 MG PO TABS
1.0000 | ORAL_TABLET | Freq: Three times a day (TID) | ORAL | Status: DC | PRN
  Administered 2016-01-10 (×2): 1 via ORAL
  Filled 2016-01-06 (×2): qty 1

## 2016-01-06 MED ORDER — SODIUM CHLORIDE 0.9 % IV SOLN
INTRAVENOUS | Status: DC
  Administered 2016-01-06 – 2016-01-08 (×4): via INTRAVENOUS

## 2016-01-06 MED ORDER — METOPROLOL TARTRATE 50 MG PO TABS
100.0000 mg | ORAL_TABLET | Freq: Two times a day (BID) | ORAL | Status: DC
  Administered 2016-01-06 – 2016-01-10 (×8): 100 mg via ORAL
  Filled 2016-01-06 (×8): qty 2

## 2016-01-06 MED ORDER — SODIUM CHLORIDE 0.9 % IV SOLN
8.0000 mg/h | INTRAVENOUS | Status: DC
  Administered 2016-01-06 – 2016-01-08 (×5): 8 mg/h via INTRAVENOUS
  Filled 2016-01-06 (×11): qty 80

## 2016-01-06 MED ORDER — ACETAMINOPHEN 650 MG RE SUPP: 650.0000 mg | Freq: Four times a day (QID) | RECTAL | Status: DC | PRN

## 2016-01-06 MED ORDER — ONDANSETRON HCL 4 MG PO TABS: 4.0000 mg | ORAL_TABLET | Freq: Four times a day (QID) | ORAL | Status: DC | PRN

## 2016-01-06 MED ORDER — DILTIAZEM HCL 60 MG PO TABS
60.0000 mg | ORAL_TABLET | Freq: Three times a day (TID) | ORAL | Status: DC
  Administered 2016-01-06 – 2016-01-10 (×13): 60 mg via ORAL
  Filled 2016-01-06 (×13): qty 1

## 2016-01-06 MED ORDER — ACETAMINOPHEN 325 MG PO TABS: 650.0000 mg | ORAL_TABLET | Freq: Four times a day (QID) | ORAL | Status: DC | PRN

## 2016-01-06 MED ORDER — ONDANSETRON HCL 4 MG/2ML IJ SOLN
4.0000 mg | Freq: Four times a day (QID) | INTRAMUSCULAR | Status: DC | PRN
Start: 2016-01-06 — End: 2016-01-10

## 2016-01-06 MED ORDER — PHYTONADIONE 5 MG PO TABS
10.0000 mg | ORAL_TABLET | Freq: Once | ORAL | Status: AC
  Administered 2016-01-06: 10 mg via ORAL
  Filled 2016-01-06: qty 2

## 2016-01-06 NOTE — ED Notes (Signed)
Pt can go to floor at 15:05 

## 2016-01-06 NOTE — ED Notes (Signed)
Pt c/o intermittent generalized abdominal pain and low back pain x 1 month, dark stools x 2 days ago, and bright red rectal bleeding and diarrhea starting this morning.  Pain score 6/10.  Pt has been seen by PCP several times for same.  Pt reports recent diagnosis of compression fracture.  Hx of diverticulosis.  Pt reports "my doctor told me that I am losing blood.  My hemoglobin has dropped from 13.4 to 10.7 in 3 weeks."

## 2016-01-06 NOTE — H&P (Signed)
History and Physical    Adrienne [name] [id number] DOB: [birth year] DOA: 01/06/2016  Referring MD/NP/PA:  PCP: Tawanna Solo, MD  Outpatient Specialists: Cardiology Patient coming from: Home  Chief Complaint: Bloody stools  HPI: Adrienne [name] is a 82 y.o. female with medical history significant of diverticulosis, peptic ulcer disease, atrial fibrillation on chronic anticoagulation with warfarin, coronary artery disease status post cardiac catheterization fimbria 2013, dyslipidemia, hypertension, presenting to the emergency room with complaints of bloody stools. She states that she first noted black tarry stools on Sunday then this morning had an episode of large bloody diarrhea. She denies taking NSAIDS. She reports having vague abdominal pain in the right upper quadrant and left lower quadrants. She denies chest pain, shortness of breath, palpitations, hematuria, dysuria, syncope or presyncope. She recently moved to the area from [place], resides in the community with her husband  ED Course: Lab work in the emergency department included a CBC that showed a hemoglobin of 11.1 with hematocrit 33.7. INR was therapeutic at 2.5. Emergency room provider discussed case with GI who will consult  Review of Systems: As per HPI otherwise 10 point review of systems negative.    Past Medical History  Diagnosis Date  . Afib ([place])   . Elevated uric acid in blood   . Arthritis of ankle   . Pulmonary HTN ([place])   . Long-term (current) use of anticoagulants   . Moderate mitral regurgitation   . CAD (coronary artery disease)   . Edema   . Cancer [facility]) 2012    breast cancer-right  . Hypertension   . Urinary incontinence   . TIA (transient ischemic attack) 09/2011  . Diverticulosis   . Compression fracture of T12 vertebra [facility])     Past Surgical History  Procedure Laterality Date  . Cardiac catheterization  2013  . Replacement total knee bilateral    . Gallbladder surgery    .  Appendectomy    . Mastectomy Right   . Cataract extraction, bilateral    . Breast surgery  2012    Rt.mastectomy--[place]  . Tonsillectomy and adenoidectomy      as a child  . Dilation and curettage of uterus      in her early 54's for DUB     reports that she has quit smoking. Her smoking use included Cigarettes. She does not have any smokeless tobacco history on file. She reports that she does not drink alcohol or use illicit drugs.  Allergies  Allergen Reactions  . Amiodarone Hcl [Amiodarone]     AFIB  . Compazine [Prochlorperazine]     HYPER  . Other Other (See Comments)    "Kidney dye"--patient states this gave her severe shakes/chills  . Thorazine [Chlorpromazine]     HYPER  . Valium [Diazepam]     HYPER   . Zometa [Zoledronic Acid]     PROBLEMS WITH EYES    Family History  Problem Relation Age of Onset  . Asthma Mother   . CVA Father   . Atrial fibrillation Sister     HAS PACER  . Pneumonia Brother   . Cancer Brother     BREAST CANCER  . Cancer Daughter     COLON CANCER    Prior to Admission medications   Medication Sig Start Date End Date Taking? Authorizing Provider  acetaminophen (TYLENOL) 500 MG tablet Take 1,000 mg by mouth 2 (two) times daily as needed for mild pain, moderate pain, fever or headache.   Yes Historical Provider,  MD  anastrozole (ARIMIDEX) 1 MG tablet Take 1 mg by mouth daily.   Yes Historical Provider, MD  CALCIUM CITRATE PO Take 630 mg by mouth 2 (two) times daily.   Yes Historical Provider, MD  Cholecalciferol (VITAMIN D PO) Take 1,900 mg by mouth daily.    Yes Historical Provider, MD  diltiazem (CARDIZEM) 60 MG tablet Take 1 tablet (60 mg total) by mouth 3 (three) times daily. 06/10/15  Yes Jerline Pain, MD  HYDROcodone-acetaminophen (NORCO/VICODIN) 5-325 MG tablet Take 1 tablet by mouth every 8 (eight) hours as needed for moderate pain.   Yes Historical Provider, MD  metoprolol (LOPRESSOR) 100 MG tablet Take 1 tablet (100 mg  total) by mouth 2 (two) times daily. 06/10/15  Yes Jerline Pain, MD  omeprazole (PRILOSEC) 40 MG capsule Take 40 mg by mouth daily.    Yes Historical Provider, MD  trimethoprim (TRIMPEX) 100 MG tablet Take 1 tablet by mouth daily.  11/10/15  Yes Historical Provider, MD  vitamin C (ASCORBIC ACID) 500 MG tablet Take 500 mg by mouth 2 (two) times daily.   Yes Historical Provider, MD  warfarin (COUMADIN) 4 MG tablet Take 4 mg by mouth See admin instructions. Takes 4mg  on Mon, Wed, Fri, Sat, and Sun *Has to be Brand Name*   Yes Historical Provider, MD  warfarin (COUMADIN) 5 MG tablet Take 5 mg by mouth See admin instructions. Takes 5mg  on Tues and Thurs  *Has to be Brand Name*   Yes Historical Provider, MD    Physical Exam: Filed Vitals:   01/06/16 1156  BP: 187/77  Pulse: 78  Resp: 13  Height: 5\' 1"  (1.549 m)  Weight: 69.854 kg (154 lb)  SpO2: 97%      Constitutional: NAD, calm, comfortable Filed Vitals:   01/06/16 1156  BP: 187/77  Pulse: 78  Resp: 13  Height: 5\' 1"  (1.549 m)  Weight: 69.854 kg (154 lb)  SpO2: 97%   Eyes: PERRL, lids and conjunctivae normal ENMT: Mucous membranes are moist. Posterior pharynx clear of any exudate or lesions.Normal dentition.  Neck: normal, supple, no masses, no thyromegaly Respiratory: clear to auscultation bilaterally, no wheezing, no crackles. Normal respiratory effort. No accessory muscle use.  Cardiovascular: Regular rate and rhythm, no murmurs / rubs / gallops. No extremity edema. 2+ pedal pulses. No carotid bruits.  Abdomen: no tenderness, no masses palpated. No hepatosplenomegaly. Bowel sounds positive.  Musculoskeletal: no clubbing / cyanosis. No joint deformity upper and lower extremities. Good ROM, no contractures. Normal muscle tone.  Skin: no rashes, lesions, ulcers. No induration Neurologic: CN 2-12 grossly intact. Sensation intact, DTR normal. Strength 5/5 in all 4.  Psychiatric: Normal judgment and insight. Alert and oriented x 3.  Normal mood.   Labs on Admission: I have personally reviewed following labs and imaging studies  CBC:  Recent Labs Lab 01/06/16 1243  WBC 7.3  HGB 11.1*  HCT 33.7*  MCV 89.9  PLT 99991111   Basic Metabolic Panel:  Recent Labs Lab 01/06/16 1243  NA 142  K 4.2  CL 106  CO2 26  GLUCOSE 108*  BUN 23*  CREATININE 0.83  CALCIUM 9.6   GFR: Estimated Creatinine Clearance: 43.5 mL/min (by C-G formula based on Cr of 0.83). Liver Function Tests:  Recent Labs Lab 01/06/16 1243  AST 17  ALT 17  ALKPHOS 85  BILITOT 0.4  PROT 7.0  ALBUMIN 4.0    Recent Labs Lab 01/06/16 1243  LIPASE 38   No results for  input(s): AMMONIA in the last 168 hours. Coagulation Profile:  Recent Labs Lab 01/06/16 1243  INR 2.50*   Cardiac Enzymes: No results for input(s): CKTOTAL, CKMB, CKMBINDEX, TROPONINI in the last 168 hours. BNP (last 3 results) No results for input(s): PROBNP in the last 8760 hours. HbA1C: No results for input(s): HGBA1C in the last 72 hours. CBG: No results for input(s): GLUCAP in the last 168 hours. Lipid Profile: No results for input(s): CHOL, HDL, LDLCALC, TRIG, CHOLHDL, LDLDIRECT in the last 72 hours. Thyroid Function Tests: No results for input(s): TSH, T4TOTAL, FREET4, T3FREE, THYROIDAB in the last 72 hours. Anemia Panel: No results for input(s): VITAMINB12, FOLATE, FERRITIN, TIBC, IRON, RETICCTPCT in the last 72 hours. Urine analysis: No results found for: COLORURINE, APPEARANCEUR, LABSPEC, PHURINE, GLUCOSEU, HGBUR, BILIRUBINUR, KETONESUR, PROTEINUR, UROBILINOGEN, NITRITE, LEUKOCYTESUR Sepsis Labs: @LABRCNTIP (procalcitonin:4,lacticidven:4) )No results found for this or any previous visit (from the past 240 hour(s)).   Radiological Exams on Admission: No results found.  EKG: Independently reviewed. EKG showing A. fib  Assessment/Plan Principal Problem:   GI bleed Active Problems:   Afib ([facility])   Chronic atrial fibrillation ([facility])   Pulmonary  hypertension, secondary ([facility])   Chronic anticoagulation   Hyperlipidemia   Essential hypertension  1.  GI bleed. She presents to the emergency room reporting having several episodes of black tarry stools started last Sunday then this morning developed large bloody diarrhea. Having a history of diverticulosis would make an argument for diverticular bleed. However, she also reports having history of peptic ulcer disease. Will start her on IV Protonix and discontinue Coumadin. Additionally will provide 10 mg of vitamin K for Coumadin reversal. Per medical records she had been on Coumadin for atrial fibrillation. Labs showing stable hemoglobin of 11.1. GI already consulted by emergency room provider will await further recommendations from them.  2.  Atrial fibrillation. CHADSVasc score of 4. Prior to this presentation she had been anticoagulated with Coumadin having therapeutic INR of 2.5. However, given the presence of GI bleed having both bright red blood per rectum and melena will discontinue warfarin and reversed with 10 mg of vitamin K. Plan to continue metoprolol 100 mg by mouth twice a day and Cardizem 60 mg by mouth twice a day.  3.  Coronary artery disease.  Last catheterization performed in February 2013 that revealed nonobstructive coronary artery disease. She denies chest pain or shortness of breath. Plan to continue beta blocker therapy.  4.  Abdominal pain. She has mild abdominal pain involving left lower quadrant and right upper quadrants, having a recent CT scan of abdomen and pelvis with IV contrast 12/25/2015 that revealed distal colonic diverticulosis without evidence of diverticulitis. For now we'll monitor.  5.  Hypertension. Plan to continue metoprolol 100 mg by mouth twice a day and Cardizem 60 mg by mouth twice a day  6.  History of breast cancer. Continue Arimedex therapy   DVT prophylaxis: SCD's Code Status: Full Family Communication: Spoke with her husband and daughter  present at bedside Disposition Plan: Will admit to the inpatient service, anticipate she may require greater than 2 nights hospitalization Consults called: GI Admission status: Telemetry   Kelvin Cellar MD [facility] Pager [phone]  If 7PM-7AM, please contact night-coverage [email or web address] Password [facility]  01/06/2016, 2:23 PM

## 2016-01-07 DIAGNOSIS — K5791 Diverticulosis of intestine, part unspecified, without perforation or abscess with bleeding: Secondary | ICD-10-CM

## 2016-01-07 LAB — CBC
HCT: 25.9 % — ABNORMAL LOW (ref 36.0–46.0)
Hemoglobin: 8.7 g/dL — ABNORMAL LOW (ref 12.0–15.0)
MCH: 30.1 pg (ref 26.0–34.0)
MCHC: 33.6 g/dL (ref 30.0–36.0)
MCV: 89.6 fL (ref 78.0–100.0)
PLATELETS: 245 10*3/uL (ref 150–400)
RBC: 2.89 MIL/uL — ABNORMAL LOW (ref 3.87–5.11)
RDW: 14.2 % (ref 11.5–15.5)
WBC: 7.3 10*3/uL (ref 4.0–10.5)

## 2016-01-07 LAB — BASIC METABOLIC PANEL
Anion gap: 8 (ref 5–15)
BUN: 24 mg/dL — ABNORMAL HIGH (ref 6–20)
CALCIUM: 8.4 mg/dL — AB (ref 8.9–10.3)
CO2: 25 mmol/L (ref 22–32)
CREATININE: 0.74 mg/dL (ref 0.44–1.00)
Chloride: 110 mmol/L (ref 101–111)
GFR calc Af Amer: >60 mL/min (ref >60)
GFR calc non Af Amer: >60 mL/min (ref >60)
GLUCOSE: 85 mg/dL (ref 65–99)
Potassium: 4 mmol/L (ref 3.5–5.1)
Sodium: 143 mmol/L (ref 135–145)

## 2016-01-07 LAB — PROTIME-INR
INR: 1.97 — ABNORMAL HIGH (ref 0.00–1.49)
Prothrombin Time: 22.3 seconds — ABNORMAL HIGH (ref 11.6–15.2)

## 2016-01-07 LAB — PREPARE RBC (CROSSMATCH)

## 2016-01-07 MED ORDER — SODIUM CHLORIDE 0.9 % IV SOLN
INTRAVENOUS | Status: DC
  Administered 2016-01-08: 06:00:00 via INTRAVENOUS

## 2016-01-07 MED ORDER — DEXTROSE 5 % IV SOLN
10.0000 mg | Freq: Once | INTRAVENOUS | Status: AC
  Administered 2016-01-07: 10 mg via INTRAVENOUS
  Filled 2016-01-07: qty 1

## 2016-01-07 MED ORDER — BOOST / RESOURCE BREEZE PO LIQD
1.0000 | Freq: Three times a day (TID) | ORAL | Status: DC
  Administered 2016-01-07 – 2016-01-10 (×6): 1 via ORAL

## 2016-01-07 MED ORDER — SODIUM CHLORIDE 0.9 % IV SOLN
Freq: Once | INTRAVENOUS | Status: AC
  Administered 2016-01-07: 17:00:00 via INTRAVENOUS

## 2016-01-07 NOTE — Consult Note (Signed)
[facility] Gastroenterology Consultation Note  Referring Provider: Dr. Kelvin Cellar [facility]) Primary Care Physician:  Tawanna Solo, MD  Reason for Consultation:  Melena, anemia  HPI: Adrienne [name] is a 82 y.o. female whom we've been asked to see for above reasons.  Patient recently relocated from [place] to [place].  Over the past few days, patient has had intermittent melena and upper abdominal pain.  She had CT scan showing distal diverticulosis, otherwise unrevealing.  She had MRI spine showing T-12 compression fracture, as well as some disk protrusions.  She takes warfarin for atrial fibrillation, admission INR 2.5, now 1.97 after Vitamin K.  She has had a few weeks of intermittent upper abdominal and right upper quadrant abdominal pain.  No nausea, vomiting, hematemesis.  Couple episodes of mild hematochezia admixed with the melena.  Hgb has dropped from ~ 13 to ~ 9.  No NSAIDs.  History of prior endoscopy several years ago, unclear reasons. History of duodenal ulcer (diagnosed how?) in the 1950s.   Last colonoscopy, done in [place], about 5 years ago, showed diverticulosis only per patient.   Past Medical History  Diagnosis Date  . Afib ([place])   . Elevated uric acid in blood   . Arthritis of ankle   . Pulmonary HTN ([place])   . Long-term (current) use of anticoagulants   . Moderate mitral regurgitation   . CAD (coronary artery disease)   . Edema   . Cancer [facility]) 2012    breast cancer-right  . Hypertension   . Urinary incontinence   . TIA (transient ischemic attack) 09/2011  . Diverticulosis   . Compression fracture of T12 vertebra [facility])     Past Surgical History  Procedure Laterality Date  . Cardiac catheterization  2013  . Replacement total knee bilateral    . Gallbladder surgery    . Appendectomy    . Mastectomy Right   . Cataract extraction, bilateral    . Breast surgery  2012    Rt.mastectomy--[place]  . Tonsillectomy and adenoidectomy      as a  child  . Dilation and curettage of uterus      in her early 23's for DUB    Prior to Admission medications   Medication Sig Start Date End Date Taking? Authorizing Provider  acetaminophen (TYLENOL) 500 MG tablet Take 1,000 mg by mouth 2 (two) times daily as needed for mild pain, moderate pain, fever or headache.   Yes Historical Provider, MD  anastrozole (ARIMIDEX) 1 MG tablet Take 1 mg by mouth daily.   Yes Historical Provider, MD  CALCIUM CITRATE PO Take 630 mg by mouth 2 (two) times daily.   Yes Historical Provider, MD  Cholecalciferol (VITAMIN D PO) Take 1,900 mg by mouth daily.    Yes Historical Provider, MD  diltiazem (CARDIZEM) 60 MG tablet Take 1 tablet (60 mg total) by mouth 3 (three) times daily. 06/10/15  Yes Jerline Pain, MD  HYDROcodone-acetaminophen (NORCO/VICODIN) 5-325 MG tablet Take 1 tablet by mouth every 8 (eight) hours as needed for moderate pain.   Yes Historical Provider, MD  metoprolol (LOPRESSOR) 100 MG tablet Take 1 tablet (100 mg total) by mouth 2 (two) times daily. 06/10/15  Yes Jerline Pain, MD  omeprazole (PRILOSEC) 40 MG capsule Take 40 mg by mouth daily.    Yes Historical Provider, MD  trimethoprim (TRIMPEX) 100 MG tablet Take 1 tablet by mouth daily.  11/10/15  Yes Historical Provider, MD  vitamin C (ASCORBIC ACID) 500 MG tablet Take  500 mg by mouth 2 (two) times daily.   Yes Historical Provider, MD  warfarin (COUMADIN) 4 MG tablet Take 4 mg by mouth See admin instructions. Takes 4mg  on Mon, Wed, Fri, Sat, and Sun *Has to be Brand Name*   Yes Historical Provider, MD  warfarin (COUMADIN) 5 MG tablet Take 5 mg by mouth See admin instructions. Takes 5mg  on Tues and Thurs  *Has to be Brand Name*   Yes Historical Provider, MD    Current Facility-Administered Medications  Medication Dose Route Frequency Provider Last Rate Last Dose  . 0.9 %  sodium chloride infusion   Intravenous Continuous Kelvin Cellar, MD 75 mL/hr at 01/07/16 [phone]    . 0.9 %  sodium chloride  infusion   Intravenous Once Kelvin Cellar, MD      . acetaminophen (TYLENOL) tablet 650 mg  650 mg Oral Q6H PRN Kelvin Cellar, MD       Or  . acetaminophen (TYLENOL) suppository 650 mg  650 mg Rectal Q6H PRN Kelvin Cellar, MD      . anastrozole (ARIMIDEX) tablet 1 mg  1 mg Oral Daily Kelvin Cellar, MD   1 mg at 01/07/16 0827  . diltiazem (CARDIZEM) tablet 60 mg  60 mg Oral TID Kelvin Cellar, MD   60 mg at 01/07/16 [id number]  . HYDROcodone-acetaminophen (NORCO/VICODIN) 5-325 MG per tablet 1 tablet  1 tablet Oral Q8H PRN Kelvin Cellar, MD      . metoprolol (LOPRESSOR) tablet 100 mg  100 mg Oral BID Kelvin Cellar, MD   100 mg at 01/07/16 0827  . ondansetron (ZOFRAN) tablet 4 mg  4 mg Oral Q6H PRN Kelvin Cellar, MD       Or  . ondansetron (ZOFRAN) injection 4 mg  4 mg Intravenous Q6H PRN Kelvin Cellar, MD      . oxyCODONE (Oxy IR/ROXICODONE) immediate release tablet 5 mg  5 mg Oral Q4H PRN Kelvin Cellar, MD      . pantoprazole (PROTONIX) 80 mg in sodium chloride 0.9 % 250 mL (0.32 mg/mL) infusion  8 mg/hr Intravenous Continuous Kelvin Cellar, MD 25 mL/hr at 01/07/16 0220 8 mg/hr at 01/07/16 0220  . [START ON 01/10/2016] pantoprazole (PROTONIX) injection 40 mg  40 mg Intravenous Q12H Kelvin Cellar, MD      . phytonadione (VITAMIN K) 10 mg in dextrose 5 % 50 mL IVPB  10 mg Intravenous Once Kelvin Cellar, MD   10 mg at 01/07/16 0940  . sodium chloride flush (NS) 0.9 % injection 3 mL  3 mL Intravenous Q12H Kelvin Cellar, MD   3 mL at 01/06/16 2203    Allergies as of 01/06/2016 - Review Complete 01/06/2016  Allergen Reaction Noted  . Amiodarone hcl [amiodarone]  06/04/2015  . Compazine [prochlorperazine]  06/04/2015  . Other Other (See Comments) 11/25/2015  . Thorazine [chlorpromazine]  06/04/2015  . Valium [diazepam]  06/04/2015  . Zometa [zoledronic acid]  06/04/2015    Family History  Problem Relation Age of Onset  . Asthma Mother   . CVA Father   . Atrial fibrillation  Sister     HAS PACER  . Pneumonia Brother   . Cancer Brother     BREAST CANCER  . Cancer Daughter     COLON CANCER    Social History   Social History  . Marital Status: Married    Spouse Name: N/A  . Number of Children: N/A  . Years of Education: N/A   Occupational History  . Not on file.  Social History Main Topics  . Smoking status: Former Smoker    Types: Cigarettes  . Smokeless tobacco: Not on file  . Alcohol Use: No  . Drug Use: No  . Sexual Activity:    Partners: Male    Birth Control/ Protection: Post-menopausal     Comment: IN COLLEGE   Other Topics Concern  . Not on file   Social History Narrative    Review of Systems: Positive = bold Gen: Denies any fever, chills, rigors, night sweats, anorexia, fatigue, weakness, malaise, involuntary weight loss, and sleep disorder CV: Denies chest pain, angina, palpitations, syncope, orthopnea, PND, peripheral edema, and claudication. Resp: Denies dyspnea, cough, sputum, wheezing, coughing up blood. GI: Described in detail in HPI.    GU : Denies urinary burning, blood in urine, urinary frequency, urinary hesitancy, nocturnal urination, and urinary incontinence. MS: Denies joint pain or swelling.  Denies muscle weakness, cramps, atrophy.  Derm: Denies rash, itching, oral ulcerations, hives, unhealing ulcers.  Psych: Denies depression, anxiety, memory loss, suicidal ideation, hallucinations,  and confusion. Heme: Denies bruising, bleeding, and enlarged lymph nodes. Neuro:  Denies any headaches, dizziness, paresthesias. Endo:  Denies any problems with DM, thyroid, adrenal function.  Physical Exam: Vital signs in last 24 hours: Temp:  [97.8 F (36.6 C)-98.2 F (36.8 C)] 98 F (36.7 C) (04/27 0618) Pulse Rate:  [73-88] 83 (04/27 0618) Resp:  [13-23] 20 (04/27 0618) BP: (136-187)/(48-92) 167/92 mmHg (04/27 0618) SpO2:  [93 %-100 %] 100 % (04/27 0618) Weight:  [67.7 kg (149 lb 4 oz)-69.854 kg (154 lb)] 67.7 kg (149 lb  4 oz) (04/26 1551) Last BM Date: 01/06/16 General:   Alert, overweight, Well-developed, well-nourished, pleasant and cooperative in NAD Head:  Normocephalic and atraumatic. Eyes:  Sclera clear, no icterus.   Conjunctiva pink. Ears:  Normal auditory acuity. Nose:  No deformity, discharge,  or lesions. Mouth:  No deformity or lesions.  Oropharynx pale and dry Neck:  Supple; no masses or thyromegaly. Lungs:  Clear throughout to auscultation.   No wheezes, crackles, or rhonchi. No acute distress. Heart:  Irregularly irregular rhythm, regular rate; no murmurs, clicks, rubs,  or gallops. Abdomen:  Soft, protuberant, mild epigastric tenderness. No masses, hepatosplenomegaly or hernias noted. Normal bowel sounds, without guarding, and without rebound.     Msk:  Symmetrical without gross deformities. Normal posture. Pulses:  Normal pulses noted. Extremities:  Without clubbing or edema. Neurologic:  Alert and  oriented x4;  grossly normal neurologically. Skin:  Scattered ecchymoses, otherwise intact without significant lesions or rashes. Psych:  Alert and cooperative. Normal mood and affect.   Lab Results:  Recent Labs  01/06/16 1243 01/07/16 0458  WBC 7.3 7.3  HGB 11.1* 8.7*  HCT 33.7* 25.9*  PLT 302 245   BMET  Recent Labs  01/06/16 1243 01/07/16 0458  NA 142 143  K 4.2 4.0  CL 106 110  CO2 26 25  GLUCOSE 108* 85  BUN 23* 24*  CREATININE 0.83 0.74  CALCIUM 9.6 8.4*   LFT  Recent Labs  01/06/16 1243  PROT 7.0  ALBUMIN 4.0  AST 17  ALT 17  ALKPHOS 85  BILITOT 0.4   PT/INR  Recent Labs  01/06/16 1243 01/07/16 0458  LABPROT 26.6* 22.3*  INR 2.50* 1.97*    Studies/Results: No results found.  Impression:  1.  GI bleeding (melena, with scant admixed hematochezia), mild and ongoing for several days and without hemodynamic instability.  Clinical history, in conjunction with elevated BUN, most consistent  with upper GI tract bleeding, specifically peptic ulcer  disease for which she has prior history. 2.  Acute blood loss anemia. 3.  Coagulopathy, certainly contributing to patient's current bleeding tendency.  Plan:  1.  Correct coagulopathy. 2.  Agree with blood transfusion and supportive management. 3.  PPI. 4.  Endoscopy planned for tomorrow, if INR < 1.7.  If patient has destabilizing bleeding in the meantime, would need to consider FFP and more expedited endoscopy. 5.  Risks (bleeding, infection, bowel perforation that could require surgery, sedation-related changes in cardiopulmonary systems), benefits (identification and possible treatment of source of symptoms, exclusion of certain causes of symptoms), and alternatives (watchful waiting, radiographic imaging studies, empiric medical treatment) of upper endoscopy (EGD) were explained to patient/family in detail and patient wishes to proceed.   LOS: 1 day   Jazzmyn Filion M  01/07/2016, 9:55 AM  Pager [phone] If no answer or after 5 PM call [phone]

## 2016-01-07 NOTE — ED Provider Notes (Signed)
CSN: [id number]     Arrival date & time 01/06/16  1133 History   First MD Initiated Contact with Patient 01/06/16 1224     Chief Complaint  Patient presents with  . Abdominal Pain  . Back Pain  . Rectal Bleeding     (Consider location/radiation/quality/duration/timing/severity/associated sxs/prior Treatment) Patient is a 82 y.o. female presenting with abdominal pain, back pain, and hematochezia.  Abdominal Pain Pain location:  Generalized Pain quality: aching and pressure   Pain severity:  Mild Duration:  4 weeks Progression:  Worsening Chronicity:  New Associated symptoms: diarrhea, hematochezia and nausea   Associated symptoms: no vomiting   Back Pain Associated symptoms: abdominal pain   Rectal Bleeding Associated symptoms: abdominal pain   Associated symptoms: no vomiting     Past Medical History  Diagnosis Date  . Afib ([place])   . Elevated uric acid in blood   . Arthritis of ankle   . Pulmonary HTN ([place])   . Long-term (current) use of anticoagulants   . Moderate mitral regurgitation   . CAD (coronary artery disease)   . Edema   . Cancer [facility]) 2012    breast cancer-right  . Hypertension   . Urinary incontinence   . TIA (transient ischemic attack) 09/2011  . Diverticulosis   . Compression fracture of T12 vertebra [facility])    Past Surgical History  Procedure Laterality Date  . Cardiac catheterization  2013  . Replacement total knee bilateral    . Gallbladder surgery    . Appendectomy    . Mastectomy Right   . Cataract extraction, bilateral    . Breast surgery  2012    Rt.mastectomy--[place], [place]  . Tonsillectomy and adenoidectomy      as a child  . Dilation and curettage of uterus      in her early 46's for DUB   Family History  Problem Relation Age of Onset  . Asthma Mother   . CVA Father   . Atrial fibrillation Sister     HAS PACER  . Pneumonia Brother   . Cancer Brother     BREAST CANCER  . Cancer Daughter     COLON CANCER   Social History   Substance Use Topics  . Smoking status: Former Smoker    Types: Cigarettes  . Smokeless tobacco: None  . Alcohol Use: No   OB History    Gravida Para Term Preterm AB TAB SAB Ectopic Multiple Living   3 3 3       3      Review of Systems  Gastrointestinal: Positive for nausea, abdominal pain, diarrhea and hematochezia. Negative for vomiting.  Musculoskeletal: Positive for back pain. Negative for neck pain.  All other systems reviewed and are negative.     Allergies  Amiodarone hcl; Compazine; Other; Thorazine; Valium; and Zometa  Home Medications   Prior to Admission medications   Medication Sig Start Date End Date Taking? Authorizing Provider  acetaminophen (TYLENOL) 500 MG tablet Take 1,000 mg by mouth 2 (two) times daily as needed for mild pain, moderate pain, fever or headache.   Yes Historical Provider, MD  anastrozole (ARIMIDEX) 1 MG tablet Take 1 mg by mouth daily.   Yes Historical Provider, MD  CALCIUM CITRATE PO Take 630 mg by mouth 2 (two) times daily.   Yes Historical Provider, MD  Cholecalciferol (VITAMIN D PO) Take 1,900 mg by mouth daily.    Yes Historical Provider, MD  diltiazem (CARDIZEM) 60 MG tablet Take 1 tablet (60  mg total) by mouth 3 (three) times daily. 06/10/15  Yes Jerline Pain, MD  HYDROcodone-acetaminophen (NORCO/VICODIN) 5-325 MG tablet Take 1 tablet by mouth every 8 (eight) hours as needed for moderate pain.   Yes Historical Provider, MD  metoprolol (LOPRESSOR) 100 MG tablet Take 1 tablet (100 mg total) by mouth 2 (two) times daily. 06/10/15  Yes Jerline Pain, MD  omeprazole (PRILOSEC) 40 MG capsule Take 40 mg by mouth daily.    Yes Historical Provider, MD  trimethoprim (TRIMPEX) 100 MG tablet Take 1 tablet by mouth daily.  11/10/15  Yes Historical Provider, MD  vitamin C (ASCORBIC ACID) 500 MG tablet Take 500 mg by mouth 2 (two) times daily.   Yes Historical Provider, MD  warfarin (COUMADIN) 4 MG tablet Take 4 mg by mouth See admin instructions. Takes  4mg  on Mon, Wed, Fri, Sat, and Sun *Has to be Brand Name*   Yes Historical Provider, MD  warfarin (COUMADIN) 5 MG tablet Take 5 mg by mouth See admin instructions. Takes 5mg  on Tues and Thurs  *Has to be Brand Name*   Yes Historical Provider, MD   BP 167/92 mmHg  Pulse 83  Temp(Src) 98 F (36.7 C) (Oral)  Resp 20  Ht 5\' 1"  (1.549 m)  Wt 149 lb 4 oz (67.7 kg)  BMI 28.22 kg/m2  SpO2 100%  LMP 09/12/1973 (Approximate) Physical Exam  Constitutional: She is oriented to person, place, and time. She appears well-developed and well-nourished.  HENT:  Head: Normocephalic and atraumatic.  Neck: Normal range of motion.  Cardiovascular: Normal rate and regular rhythm.  Exam reveals no friction rub.   No murmur heard. Pulmonary/Chest: No stridor. No respiratory distress.  Abdominal: Soft. She exhibits no distension. There is no tenderness.  Musculoskeletal: Normal range of motion. She exhibits no edema or tenderness.  Neurological: She is alert and oriented to person, place, and time.  Skin: Skin is warm and dry. No rash noted. No erythema.  Nursing note and vitals reviewed.   ED Course  Procedures (including critical care time) Labs Review Labs Reviewed  COMPREHENSIVE METABOLIC PANEL - Abnormal; Notable for the following:    Glucose, Bld 108 (*)    BUN 23 (*)    All other components within normal limits  CBC - Abnormal; Notable for the following:    RBC 3.75 (*)    Hemoglobin 11.1 (*)    HCT 33.7 (*)    All other components within normal limits  PROTIME-INR - Abnormal; Notable for the following:    Prothrombin Time 26.6 (*)    INR 2.50 (*)    All other components within normal limits  URINALYSIS, ROUTINE W REFLEX MICROSCOPIC (NOT AT [facility]) - Abnormal; Notable for the following:    APPearance CLOUDY (*)    Leukocytes, UA MODERATE (*)    All other components within normal limits  URINE MICROSCOPIC-ADD ON - Abnormal; Notable for the following:    Squamous Epithelial / LPF 0-5 (*)     Bacteria, UA RARE (*)    All other components within normal limits  BASIC METABOLIC PANEL - Abnormal; Notable for the following:    BUN 24 (*)    Calcium 8.4 (*)    All other components within normal limits  CBC - Abnormal; Notable for the following:    RBC 2.89 (*)    Hemoglobin 8.7 (*)    HCT 25.9 (*)    All other components within normal limits  PROTIME-INR - Abnormal; Notable for  the following:    Prothrombin Time 22.3 (*)    INR 1.97 (*)    All other components within normal limits  LIPASE, BLOOD  TYPE AND SCREEN  ABO/RH  PREPARE RBC (CROSSMATCH)    Imaging Review No results found. I have personally reviewed and evaluated these images and lab results as part of my medical decision-making.   EKG Interpretation   Date/Time:  Wednesday January 06 2016 11:56:03 EDT Ventricular Rate:  78 PR Interval:    QRS Duration: 100 QT Interval:  383 QTC Calculation: 436 R Axis:   41 Text Interpretation:  Atrial fibrillation Low voltage, precordial leads  Borderline repolarization abnormality Baseline wander in lead(s) V3 V6  Confirmed by [facility] MD, Corene Cornea [id number]) on 01/06/2016 1:02:40 PM      MDM   Final diagnoses:  Gastrointestinal hemorrhage associated with intestinal diverticulosis  Chronic anticoagulation    82 yo F w/ rectal bleeding for three days on coumadin with therapeutic INR and 3 g hemoglobin drop in since earlier this month. No hemodynamic instability, stable for admission to tele for GI workup.     Merrily Pew, MD 01/07/16 [id number]

## 2016-01-07 NOTE — Care Management Note (Signed)
Case Management Note  Patient Details  Name: Adrienne [name] MRN: [id number] Date of Birth: Dec 04, 1927  Subjective/Objective: 82 y/o f admitted w/GIB. From home. GI cons. EGD in am.                   Action/Plan:d/c plan home.   Expected Discharge Date:   (unknown)               Expected Discharge Plan:  Home/Self Care  In-House Referral:     Discharge planning Services  CM Consult  Post Acute Care Choice:    Choice offered to:     DME Arranged:    DME Agency:     HH Arranged:    HH Agency:     Status of Service:  In process, will continue to follow  Medicare Important Message Given:    Date Medicare IM Given:    Medicare IM give by:    Date Additional Medicare IM Given:    Additional Medicare Important Message give by:     If discussed at [place] of Stay Meetings, dates discussed:    Additional Comments:  Dessa Phi, RN 01/07/2016, 12:03 PM

## 2016-01-07 NOTE — Progress Notes (Signed)
PROGRESS NOTE    Adrienne [name]  [id number] DOB: 10/17/1927 DOA: 01/06/2016 PCP: Tawanna Solo, MD Outpatient Specialists:   Brief Narrative: [name] is a pleasant 82 year old female with a history of diverticulosis, peptic ulcer disease, atrial fibrillation on chronic anticoagulation with warfarin, who was admitted to the medicine service on 01/06/2016 when she presented with complaints of bloody stools. Initial lab work revealed a hemoglobin of 11.1. INR was in the therapeutic range at 2.5. She was given 10 mg of vitamin K warfarin was discontinued. She was admitted to telemetry. Overnight she had several episodes of bloody stools. On the following day her hemoglobin trended down to 8.7. Dr. Paulita Fujita of GI was consulted. Suspect source of GI bleed to be diverticular.    Assessment & Plan:   Principal Problem:   GI bleed Active Problems:   Afib (HCC)   Chronic atrial fibrillation (HCC)   Pulmonary hypertension, secondary (HCC)   Chronic anticoagulation   Hyperlipidemia   Essential hypertension   1.   Suspected lower GI bleed. -[name] is an 82 year old female with a history of diverticulosis, history of peptic ulcer disease, had been anticoagulated with warfarin for A. Fib. -Presented with complaints of bright red blood per rectum, initially describing dark/black stools than becoming grossly bloody. -She had a CT scan of abdomen and pelvis with contrast 12/25/2015 that revealed diverticulosis. She had stated her history having peptic ulcer disease as well. Lab work this morning showing BUN of 24, which may be more indicative of lower GI bleed -Coumadin discontinued and is being reversed, given additional vitamin K today.  -Dr. Paulita Fujita of GI was consulted, await further recommendations. She remains nothing by mouth   2.  Acute blood loss anemia. -Hemoglobin dropping from 11.1 on admission to 8.7 on this mornings lab work. -She reported having at least 3 bloody stools  overnight -She'll be typed and crossed and transfused with 1 unit of packed red blood cells have an active GI bleed -As mentioned above I suspect blood loss related to diverticular bleed.  3.  History of atrial fibrillation. -Had been anticoagulated with warfarin presenting with a therapeutic INR of 2.9. INR trending down to 1.97 on a.m. lab work however continued to bleed overnight. -Will give 10 mg of IV vitamin K now -She remains rate controlled -Continue metoprolol 100 mg twice a day and Cardizem 60 mg by mouth 3 times a day  4.  Hypertension. -Continue Cardizem 60 mg by mouth 3 times a day and metoprolol 100 mg by mouth twice a day -Blood pressures elevated this morning    DVT prophylaxis: SCD's Code Status: Full code Family Communication: Spoke to her husband was present at bedside Disposition Plan: GI consulted, likely undergo colonoscopy in a.m.   Consultants:   GI  Procedures:  *   Antimicrobials:    Subjective: She states feeling weak this morning not herself. Thinks she might do better she had something in her stomach and she is currently nothing by mouth  Objective: Filed Vitals:   01/06/16 1501 01/06/16 1551 01/06/16 2108 01/07/16 0618  BP: 181/84 170/79 136/48 167/92  Pulse: 84 88 87 83  Temp:  97.8 F (36.6 C) 98.2 F (36.8 C) 98 F (36.7 C)  TempSrc:  Oral Oral Oral  Resp: 20 18 18 20   Height:  5\' 1"  (1.549 m)    Weight:  67.7 kg (149 lb 4 oz)    SpO2: 97% 98% 98% 100%    Intake/Output Summary (Last 24 hours)  at 01/07/16 0827 Last data filed at 01/07/16 0745  Gross per 24 hour  Intake 1112.5 ml  Output   1650 ml  Net -537.5 ml   Filed Weights   01/06/16 1156 01/06/16 1551  Weight: 69.854 kg (154 lb) 67.7 kg (149 lb 4 oz)    Examination:  General exam: Looks pale, awake and alert is able to provide history. Respiratory system: Clear to auscultation. Respiratory effort normal. Cardiovascular system: S1 & S2 heard, RRR. No JVD,  murmurs, rubs, gallops or clicks. No pedal edema. Gastrointestinal system: Abdomen is nondistended, soft and nontender. No organomegaly or masses felt. Normal bowel sounds heard. Central nervous system: Alert and oriented. No focal neurological deficits. Extremities: Symmetric 5 x 5 power. Skin: No rashes, lesions or ulcers Psychiatry: Judgement and insight appear normal. Mood & affect appropriate.     Data Reviewed: I have personally reviewed following labs and imaging studies  CBC:  Recent Labs Lab 01/06/16 1243 01/07/16 0458  WBC 7.3 7.3  HGB 11.1* 8.7*  HCT 33.7* 25.9*  MCV 89.9 89.6  PLT 302 [id number]   Basic Metabolic Panel:  Recent Labs Lab 01/06/16 1243 01/07/16 0458  NA 142 143  K 4.2 4.0  CL 106 110  CO2 26 25  GLUCOSE 108* 85  BUN 23* 24*  CREATININE 0.83 0.74  CALCIUM 9.6 8.4*   GFR: Estimated Creatinine Clearance: 44.5 mL/min (by C-G formula based on Cr of 0.74). Liver Function Tests:  Recent Labs Lab 01/06/16 1243  AST 17  ALT 17  ALKPHOS 85  BILITOT 0.4  PROT 7.0  ALBUMIN 4.0    Recent Labs Lab 01/06/16 1243  LIPASE 38   No results for input(s): AMMONIA in the last 168 hours. Coagulation Profile:  Recent Labs Lab 01/06/16 1243 01/07/16 0458  INR 2.50* 1.97*   Cardiac Enzymes: No results for input(s): CKTOTAL, CKMB, CKMBINDEX, TROPONINI in the last 168 hours. BNP (last 3 results) No results for input(s): PROBNP in the last 8760 hours. HbA1C: No results for input(s): HGBA1C in the last 72 hours. CBG: No results for input(s): GLUCAP in the last 168 hours. Lipid Profile: No results for input(s): CHOL, HDL, LDLCALC, TRIG, CHOLHDL, LDLDIRECT in the last 72 hours. Thyroid Function Tests: No results for input(s): TSH, T4TOTAL, FREET4, T3FREE, THYROIDAB in the last 72 hours. Anemia Panel: No results for input(s): VITAMINB12, FOLATE, FERRITIN, TIBC, IRON, RETICCTPCT in the last 72 hours. Urine analysis:    Component Value Date/Time    COLORURINE YELLOW 01/06/2016 1258   APPEARANCEUR CLOUDY* 01/06/2016 1258   LABSPEC 1.011 01/06/2016 1258   PHURINE 6.5 01/06/2016 1258   GLUCOSEU NEGATIVE 01/06/2016 1258   HGBUR NEGATIVE 01/06/2016 1258   [place] 01/06/2016 [place] 01/06/2016 1258   PROTEINUR NEGATIVE 01/06/2016 1258   NITRITE NEGATIVE 01/06/2016 1258   LEUKOCYTESUR MODERATE* 01/06/2016 1258   Sepsis Labs: @LABRCNTIP (procalcitonin:4,lacticidven:4)  )No results found for this or any previous visit (from the past 240 hour(s)).       Radiology Studies: No results found.      Scheduled Meds: . sodium chloride   Intravenous Once  . anastrozole  1 mg Oral Daily  . diltiazem  60 mg Oral TID  . metoprolol  100 mg Oral BID  . [START ON 01/10/2016] pantoprazole (PROTONIX) IV  40 mg Intravenous Q12H  . phytonadione (VITAMIN K) IV  10 mg Intravenous Once  . sodium chloride flush  3 mL Intravenous Q12H   Continuous Infusions: . sodium  chloride 75 mL/hr at 01/07/16 0652  . pantoprozole (PROTONIX) infusion 8 mg/hr (01/07/16 0220)     LOS: 1 day    Time spent: 63 min    Kelvin Cellar, MD [facility] Pager [phone]  If 7PM-7AM, please contact night-coverage [email or web address] Password [facility] 01/07/2016, 8:27 AM

## 2016-01-07 NOTE — Progress Notes (Signed)
Initial Nutrition Assessment  DOCUMENTATION CODES:   Non-severe (moderate) malnutrition in context of acute illness/injury  INTERVENTION:  -Boost Breeze TID. Each supplement provides 250 kcals and 9 grams of protein. -Continue to monitor for nutritional needs.  NUTRITION DIAGNOSIS:   Inadequate oral intake related to poor appetite, other (see comment) (abdominal pain) as evidenced by per patient/family report.  GOAL:   Patient will meet greater than or equal to 90% of their needs  MONITOR:   PO intake, Supplement acceptance, Diet advancement, Labs, Weight trends, Skin, I & O's  REASON FOR ASSESSMENT:   Malnutrition Screening Tool    ASSESSMENT:   82 y.o. female with medical history significant of diverticulosis, peptic ulcer disease, atrial fibrillation on chronic anticoagulation with warfarin, coronary artery disease status post cardiac catheterization fimbria 2013, dyslipidemia, hypertension, presenting to the emergency room with complaints of bloody stools. She states that she first noted black tarry stools on Sunday then this morning had an episode of large bloody diarrhea. She denies taking NSAIDS. She reports having vague abdominal pain in the right upper quadrant and left lower quadrants. She denies chest pain, shortness of breath, palpitations, hematuria, dysuria, syncope or presyncope. She recently moved to the area from [place], resides in the community with her husband  Pt seen for MST. Pt BMI is categorized as overweight. Per chart, pt weight is trending down with a 6% weight loss in the past 1.5 months. This is significant for time frame. Pt reports UBW of 150 lbs. Pt denies any recent weight loss.   Pt reports eating TID. Pt states these are "decent size meals". Pt denies abdominal pain and N/V associated with eating. Pt reports poor appetite 4 weeks PTA but normal appetite prior. Pt does not take nutritional supplements at home. Pt is on CL diet. Pt has eating broth,  coffee, and jello. Pt is tolerating CL diet well. Intern discussed importance of getting enough protein on a CL diet. Pt is amenable to trying Boost Breeze. Will order Boost Breeze until diet is advanced. Monitor for diet advancement and order appropriate supplement at f/u if needed.   NFPE: No muscle depletion, no fat depletion, moderate edema.  Labs reviewed; BUN 24 mg/dl, Ca 8.4 mg/dl. Meds reviewed; Vit K 10 mg   Diet Order:  Diet clear liquid Room service appropriate?: Yes; Fluid consistency:: Thin Diet NPO time specified  Skin:  Reviewed, no issues  Last BM:  4/26  Height:   Ht Readings from Last 1 Encounters:  01/06/16 5\' 1"  (1.549 m)    Weight:   Wt Readings from Last 1 Encounters:  01/06/16 149 lb 4 oz (67.7 kg)    Ideal Body Weight:  47.7 kg  BMI:  Body mass index is 28.22 kg/(m^2).  Estimated Nutritional Needs:   Kcal:  1350-1550 kcals (20-22 kcals)   Protein:  75-85 g (1.2 g/kg)  Fluid:  1.3-1.5 L  EDUCATION NEEDS:   No education needs identified at this time  Geoffery Lyons, [place] Dietetic Intern Pager [phone]

## 2016-01-08 ENCOUNTER — Encounter (HOSPITAL_COMMUNITY)
Admission: EM | Disposition: A | Discharge status: Home / Self Care | Payer: Self-pay | Source: Home / Self Care | Attending: Internal Medicine

## 2016-01-08 ENCOUNTER — Encounter (HOSPITAL_COMMUNITY): Payer: Self-pay | Admitting: *Deleted

## 2016-01-08 DIAGNOSIS — K922 Gastrointestinal hemorrhage, unspecified: Secondary | ICD-10-CM

## 2016-01-08 HISTORY — PX: ESOPHAGOGASTRODUODENOSCOPY: SHX5428

## 2016-01-08 LAB — TYPE AND SCREEN
ABO/RH(D): B POS
ANTIBODY SCREEN: NEGATIVE
UNIT DIVISION: 0

## 2016-01-08 LAB — PROTIME-INR
INR: 1.25 (ref 0.00–1.49)
PROTHROMBIN TIME: 15.8 s — AB (ref 11.6–15.2)

## 2016-01-08 LAB — CBC
HEMATOCRIT: 32.2 % — AB (ref 36.0–46.0)
HEMOGLOBIN: 10.8 g/dL — AB (ref 12.0–15.0)
MCH: 29.8 pg (ref 26.0–34.0)
MCHC: 33.5 g/dL (ref 30.0–36.0)
MCV: 89 fL (ref 78.0–100.0)
Platelets: 233 10*3/uL (ref 150–400)
RBC: 3.62 MIL/uL — ABNORMAL LOW (ref 3.87–5.11)
RDW: 14.4 % (ref 11.5–15.5)
WBC: 8.4 10*3/uL (ref 4.0–10.5)

## 2016-01-08 SURGERY — EGD (ESOPHAGOGASTRODUODENOSCOPY)
Anesthesia: Moderate Sedation | Laterality: Left

## 2016-01-08 MED ORDER — FENTANYL CITRATE (PF) 100 MCG/2ML IJ SOLN
INTRAMUSCULAR | Status: AC
  Filled 2016-01-08: qty 2

## 2016-01-08 MED ORDER — MIDAZOLAM HCL 5 MG/ML IJ SOLN
INTRAMUSCULAR | Status: AC
  Filled 2016-01-08: qty 2

## 2016-01-08 MED ORDER — BUTAMBEN-TETRACAINE-BENZOCAINE 2-2-14 % EX AERO
INHALATION_SPRAY | CUTANEOUS | Status: DC | PRN
  Administered 2016-01-08: 2 via TOPICAL

## 2016-01-08 MED ORDER — MIDAZOLAM HCL 10 MG/2ML IJ SOLN
INTRAMUSCULAR | Status: DC | PRN
  Administered 2016-01-08: 1 mg via INTRAVENOUS
  Administered 2016-01-08: 2 mg via INTRAVENOUS

## 2016-01-08 MED ORDER — PANTOPRAZOLE SODIUM 40 MG PO TBEC
40.0000 mg | DELAYED_RELEASE_TABLET | Freq: Every day | ORAL | Status: DC
  Administered 2016-01-09 – 2016-01-10 (×2): 40 mg via ORAL
  Filled 2016-01-08 (×3): qty 1

## 2016-01-08 MED ORDER — FENTANYL CITRATE (PF) 100 MCG/2ML IJ SOLN
INTRAMUSCULAR | Status: DC | PRN
  Administered 2016-01-08: 12.5 ug via INTRAVENOUS
  Administered 2016-01-08: 25 ug via INTRAVENOUS
  Administered 2016-01-08: 12.5 ug via INTRAVENOUS

## 2016-01-08 NOTE — H&P (View-Only) (Signed)
[facility] Gastroenterology Consultation Note  Referring Provider: Dr. Kelvin Cellar [facility]) Primary Care Physician:  Tawanna Solo, MD  Reason for Consultation:  Melena, anemia  HPI: Adrienne [name] is a 82 y.o. female whom we've been asked to see for above reasons.  Patient recently relocated from [place] to [place].  Over the past few days, patient has had intermittent melena and upper abdominal pain.  She had CT scan showing distal diverticulosis, otherwise unrevealing.  She had MRI spine showing T-12 compression fracture, as well as some disk protrusions.  She takes warfarin for atrial fibrillation, admission INR 2.5, now 1.97 after Vitamin K.  She has had a few weeks of intermittent upper abdominal and right upper quadrant abdominal pain.  No nausea, vomiting, hematemesis.  Couple episodes of mild hematochezia admixed with the melena.  Hgb has dropped from ~ 13 to ~ 9.  No NSAIDs.  History of prior endoscopy several years ago, unclear reasons. History of duodenal ulcer (diagnosed how?) in the 1950s.   Last colonoscopy, done in [place], about 5 years ago, showed diverticulosis only per patient.   Past Medical History  Diagnosis Date  . Afib ([facility] [place])   . Elevated uric acid in blood   . Arthritis of ankle   . Pulmonary HTN ([place])   . Long-term (current) use of anticoagulants   . Moderate mitral regurgitation   . CAD (coronary artery disease)   . Edema   . Cancer [facility]) 2012    breast cancer-right  . Hypertension   . Urinary incontinence   . TIA (transient ischemic attack) 09/2011  . Diverticulosis   . Compression fracture of T12 vertebra [facility])     Past Surgical History  Procedure Laterality Date  . Cardiac catheterization  2013  . Replacement total knee bilateral    . Gallbladder surgery    . Appendectomy    . Mastectomy Right   . Cataract extraction, bilateral    . Breast surgery  2012    Rt.mastectomy--[place], [place]  . Tonsillectomy and adenoidectomy      as a  child  . Dilation and curettage of uterus      in her early 30's for DUB    Prior to Admission medications   Medication Sig Start Date End Date Taking? Authorizing Provider  acetaminophen (TYLENOL) 500 MG tablet Take 1,000 mg by mouth 2 (two) times daily as needed for mild pain, moderate pain, fever or headache.   Yes Historical Provider, MD  anastrozole (ARIMIDEX) 1 MG tablet Take 1 mg by mouth daily.   Yes Historical Provider, MD  CALCIUM CITRATE PO Take 630 mg by mouth 2 (two) times daily.   Yes Historical Provider, MD  Cholecalciferol (VITAMIN D PO) Take 1,900 mg by mouth daily.    Yes Historical Provider, MD  diltiazem (CARDIZEM) 60 MG tablet Take 1 tablet (60 mg total) by mouth 3 (three) times daily. 06/10/15  Yes Jerline Pain, MD  HYDROcodone-acetaminophen (NORCO/VICODIN) 5-325 MG tablet Take 1 tablet by mouth every 8 (eight) hours as needed for moderate pain.   Yes Historical Provider, MD  metoprolol (LOPRESSOR) 100 MG tablet Take 1 tablet (100 mg total) by mouth 2 (two) times daily. 06/10/15  Yes Jerline Pain, MD  omeprazole (PRILOSEC) 40 MG capsule Take 40 mg by mouth daily.    Yes Historical Provider, MD  trimethoprim (TRIMPEX) 100 MG tablet Take 1 tablet by mouth daily.  11/10/15  Yes Historical Provider, MD  vitamin C (ASCORBIC ACID) 500 MG tablet Take  500 mg by mouth 2 (two) times daily.   Yes Historical Provider, MD  warfarin (COUMADIN) 4 MG tablet Take 4 mg by mouth See admin instructions. Takes 4mg  on Mon, Wed, Fri, Sat, and Sun *Has to be Brand Name*   Yes Historical Provider, MD  warfarin (COUMADIN) 5 MG tablet Take 5 mg by mouth See admin instructions. Takes 5mg  on Tues and Thurs  *Has to be Brand Name*   Yes Historical Provider, MD    Current Facility-Administered Medications  Medication Dose Route Frequency Provider Last Rate Last Dose  . 0.9 %  sodium chloride infusion   Intravenous Continuous Kelvin Cellar, MD 75 mL/hr at 01/07/16 [phone]    . 0.9 %  sodium chloride  infusion   Intravenous Once Kelvin Cellar, MD      . acetaminophen (TYLENOL) tablet 650 mg  650 mg Oral Q6H PRN Kelvin Cellar, MD       Or  . acetaminophen (TYLENOL) suppository 650 mg  650 mg Rectal Q6H PRN Kelvin Cellar, MD      . anastrozole (ARIMIDEX) tablet 1 mg  1 mg Oral Daily Kelvin Cellar, MD   1 mg at 01/07/16 0827  . diltiazem (CARDIZEM) tablet 60 mg  60 mg Oral TID Kelvin Cellar, MD   60 mg at 01/07/16 [id number]  . HYDROcodone-acetaminophen (NORCO/VICODIN) 5-325 MG per tablet 1 tablet  1 tablet Oral Q8H PRN Kelvin Cellar, MD      . metoprolol (LOPRESSOR) tablet 100 mg  100 mg Oral BID Kelvin Cellar, MD   100 mg at 01/07/16 0827  . ondansetron (ZOFRAN) tablet 4 mg  4 mg Oral Q6H PRN Kelvin Cellar, MD       Or  . ondansetron (ZOFRAN) injection 4 mg  4 mg Intravenous Q6H PRN Kelvin Cellar, MD      . oxyCODONE (Oxy IR/ROXICODONE) immediate release tablet 5 mg  5 mg Oral Q4H PRN Kelvin Cellar, MD      . pantoprazole (PROTONIX) 80 mg in sodium chloride 0.9 % 250 mL (0.32 mg/mL) infusion  8 mg/hr Intravenous Continuous Kelvin Cellar, MD 25 mL/hr at 01/07/16 0220 8 mg/hr at 01/07/16 0220  . [START ON 01/10/2016] pantoprazole (PROTONIX) injection 40 mg  40 mg Intravenous Q12H Kelvin Cellar, MD      . phytonadione (VITAMIN K) 10 mg in dextrose 5 % 50 mL IVPB  10 mg Intravenous Once Kelvin Cellar, MD   10 mg at 01/07/16 0940  . sodium chloride flush (NS) 0.9 % injection 3 mL  3 mL Intravenous Q12H Kelvin Cellar, MD   3 mL at 01/06/16 2203    Allergies as of 01/06/2016 - Review Complete 01/06/2016  Allergen Reaction Noted  . Amiodarone hcl [amiodarone]  06/04/2015  . Compazine [prochlorperazine]  06/04/2015  . Other Other (See Comments) 11/25/2015  . Thorazine [chlorpromazine]  06/04/2015  . Valium [diazepam]  06/04/2015  . Zometa [zoledronic acid]  06/04/2015    Family History  Problem Relation Age of Onset  . Asthma Mother   . CVA Father   . Atrial fibrillation  Sister     HAS PACER  . Pneumonia Brother   . Cancer Brother     BREAST CANCER  . Cancer Daughter     COLON CANCER    Social History   Social History  . Marital Status: Married    Spouse Name: N/A  . Number of Children: N/A  . Years of Education: N/A   Occupational History  . Not on file.  Social History Main Topics  . Smoking status: Former Smoker    Types: Cigarettes  . Smokeless tobacco: Not on file  . Alcohol Use: No  . Drug Use: No  . Sexual Activity:    Partners: Male    Birth Control/ Protection: Post-menopausal     Comment: IN COLLEGE   Other Topics Concern  . Not on file   Social History Narrative    Review of Systems: Positive = bold Gen: Denies any fever, chills, rigors, night sweats, anorexia, fatigue, weakness, malaise, involuntary weight loss, and sleep disorder CV: Denies chest pain, angina, palpitations, syncope, orthopnea, PND, peripheral edema, and claudication. Resp: Denies dyspnea, cough, sputum, wheezing, coughing up blood. GI: Described in detail in HPI.    GU : Denies urinary burning, blood in urine, urinary frequency, urinary hesitancy, nocturnal urination, and urinary incontinence. MS: Denies joint pain or swelling.  Denies muscle weakness, cramps, atrophy.  Derm: Denies rash, itching, oral ulcerations, hives, unhealing ulcers.  Psych: Denies depression, anxiety, memory loss, suicidal ideation, hallucinations,  and confusion. Heme: Denies bruising, bleeding, and enlarged lymph nodes. Neuro:  Denies any headaches, dizziness, paresthesias. Endo:  Denies any problems with DM, thyroid, adrenal function.  Physical Exam: Vital signs in last 24 hours: Temp:  [97.8 F (36.6 C)-98.2 F (36.8 C)] 98 F (36.7 C) (04/27 0618) Pulse Rate:  [73-88] 83 (04/27 0618) Resp:  [13-23] 20 (04/27 0618) BP: (136-187)/(48-92) 167/92 mmHg (04/27 0618) SpO2:  [93 %-100 %] 100 % (04/27 0618) Weight:  [67.7 kg (149 lb 4 oz)-69.854 kg (154 lb)] 67.7 kg (149 lb  4 oz) (04/26 1551) Last BM Date: 01/06/16 General:   Alert, overweight, Well-developed, well-nourished, pleasant and cooperative in NAD Head:  Normocephalic and atraumatic. Eyes:  Sclera clear, no icterus.   Conjunctiva pink. Ears:  Normal auditory acuity. Nose:  No deformity, discharge,  or lesions. Mouth:  No deformity or lesions.  Oropharynx pale and dry Neck:  Supple; no masses or thyromegaly. Lungs:  Clear throughout to auscultation.   No wheezes, crackles, or rhonchi. No acute distress. Heart:  Irregularly irregular rhythm, regular rate; no murmurs, clicks, rubs,  or gallops. Abdomen:  Soft, protuberant, mild epigastric tenderness. No masses, hepatosplenomegaly or hernias noted. Normal bowel sounds, without guarding, and without rebound.     Msk:  Symmetrical without gross deformities. Normal posture. Pulses:  Normal pulses noted. Extremities:  Without clubbing or edema. Neurologic:  Alert and  oriented x4;  grossly normal neurologically. Skin:  Scattered ecchymoses, otherwise intact without significant lesions or rashes. Psych:  Alert and cooperative. Normal mood and affect.   Lab Results:  Recent Labs  01/06/16 1243 01/07/16 0458  WBC 7.3 7.3  HGB 11.1* 8.7*  HCT 33.7* 25.9*  PLT 302 245   BMET  Recent Labs  01/06/16 1243 01/07/16 0458  NA 142 143  K 4.2 4.0  CL 106 110  CO2 26 25  GLUCOSE 108* 85  BUN 23* 24*  CREATININE 0.83 0.74  CALCIUM 9.6 8.4*   LFT  Recent Labs  01/06/16 1243  PROT 7.0  ALBUMIN 4.0  AST 17  ALT 17  ALKPHOS 85  BILITOT 0.4   PT/INR  Recent Labs  01/06/16 1243 01/07/16 0458  LABPROT 26.6* 22.3*  INR 2.50* 1.97*    Studies/Results: No results found.  Impression:  1.  GI bleeding (melena, with scant admixed hematochezia), mild and ongoing for several days and without hemodynamic instability.  Clinical history, in conjunction with elevated BUN, most consistent  with upper GI tract bleeding, specifically peptic ulcer  disease for which she has prior history. 2.  Acute blood loss anemia. 3.  Coagulopathy, certainly contributing to patient's current bleeding tendency.  Plan:  1.  Correct coagulopathy. 2.  Agree with blood transfusion and supportive management. 3.  PPI. 4.  Endoscopy planned for tomorrow, if INR < 1.7.  If patient has destabilizing bleeding in the meantime, would need to consider FFP and more expedited endoscopy. 5.  Risks (bleeding, infection, bowel perforation that could require surgery, sedation-related changes in cardiopulmonary systems), benefits (identification and possible treatment of source of symptoms, exclusion of certain causes of symptoms), and alternatives (watchful waiting, radiographic imaging studies, empiric medical treatment) of upper endoscopy (EGD) were explained to patient/family in detail and patient wishes to proceed.   LOS: 1 day   Francisca Langenderfer M  01/07/2016, 9:55 AM  Pager [phone] If no answer or after 5 PM call [phone]

## 2016-01-08 NOTE — Progress Notes (Signed)
Assumed care, agree with previous RN's assessment. 

## 2016-01-08 NOTE — Progress Notes (Signed)
PROGRESS NOTE    Adrienne [name]  [id number] DOB: [birth year] DOA: 01/06/2016 PCP: Tawanna Solo, MD Outpatient Specialists:   Brief Narrative: [name] is a pleasant 82 year old female with a history of diverticulosis, peptic ulcer disease, atrial fibrillation on chronic anticoagulation with warfarin, who was admitted to the medicine service on 01/06/2016 when she presented with complaints of bloody stools. Initial lab work revealed a hemoglobin of 11.1. INR was in the therapeutic range at 2.5. She was given 10 mg of vitamin K warfarin was discontinued. She was admitted to telemetry. Overnight she had several episodes of bloody stools. On the following day her hemoglobin trended down to 8.7. Dr. Paulita Fujita of GI was consulted. Suspect source of GI bleed to be diverticular.    Assessment & Plan:   Principal Problem:   GI bleed Active Problems:   Afib ([facility])   Chronic atrial fibrillation ([facility])   Pulmonary hypertension, secondary ([facility])   Chronic anticoagulation   Hyperlipidemia   Essential hypertension   1.   Suspected lower GI bleed. -[name] is an 82 year old female with a history of diverticulosis, history of peptic ulcer disease, had been anticoagulated with warfarin for A. Fib. -Presented with complaints of bright red blood per rectum, initially describing dark/black stools than becoming grossly bloody. -She had a CT scan of abdomen and pelvis with contrast 12/25/2015 that revealed diverticulosis. She had stated her history having peptic ulcer disease as well. Lab work this morning showing BUN of 24, which may be more indicative of lower GI bleed -Coumadin discontinued and is being reversed, given additional vitamin K on 01/07/2016.  -Dr. Paulita Fujita of GI was consulted, await further recommendations. -Plan for EGD today, she is currently nothing by mouth   2.  Acute blood loss anemia. -Hemoglobin dropping from 11.1 on admission to 8.7 on a.m. lab work on 01/07/2016 -She  reports having decrease in bloody stools -She was transfused 1 unit of packed red blood cells and 01/07/2016 given significant drop in hemoglobin.  3.  History of atrial fibrillation. -Had been anticoagulated with warfarin presenting with a therapeutic INR of 2.9. INR trending down to 1.97 on a.m. lab work however continued to bleed overnight. -Will give 10 mg of IV vitamin K now -She remains rate controlled -Continue metoprolol 100 mg twice a day and Cardizem 60 mg by mouth 3 times a day  4.  Hypertension. -Continue Cardizem 60 mg by mouth 3 times a day and metoprolol 100 mg by mouth twice a day -Blood pressures stable    DVT prophylaxis: SCD's Code Status: Full code Family Communication: Spoke to her husband was present at bedside Disposition Plan: GI consulted, plan for EGD today   Consultants:   GI  Procedures:  *   Antimicrobials:    Subjective: Reports feeling a little better this morning, received a unit of blood yesterday. States bloody stools markedly decreased.  Objective: Filed Vitals:   01/07/16 1205 01/07/16 1408 01/07/16 2052 01/08/16 0538  BP: 145/57 155/70 140/68 148/78  Pulse: 66 65 75 74  Temp: 98.2 F (36.8 C) 97.5 F (36.4 C) 98.2 F (36.8 C) 97.9 F (36.6 C)  TempSrc: Oral Oral  Oral  Resp: 18 18 18 18   Height:      Weight:      SpO2: 99% 97% 97% 98%    Intake/Output Summary (Last 24 hours) at 01/08/16 0714 Last data filed at 01/08/16 0538  Gross per 24 hour  Intake 3844.17 ml  Output   3350 ml  Net 494.17 ml   Filed Weights   01/06/16 1156 01/06/16 1551  Weight: 69.854 kg (154 lb) 67.7 kg (149 lb 4 oz)    Examination:  General exam: Looks better on this mornings examination, she is awake and alert Respiratory system: Clear to auscultation. Respiratory effort normal. Cardiovascular system: S1 & S2 heard, RRR. No JVD, murmurs, rubs, gallops or clicks. No pedal edema. Gastrointestinal system: Abdomen is nondistended, soft and  nontender. No organomegaly or masses felt. Normal bowel sounds heard. Central nervous system: Alert and oriented. No focal neurological deficits. Extremities: Symmetric 5 x 5 power. Skin: No rashes, lesions or ulcers Psychiatry: Judgement and insight appear normal. Mood & affect appropriate.     Data Reviewed: I have personally reviewed following labs and imaging studies  CBC:  Recent Labs Lab 01/06/16 1243 01/07/16 0458  WBC 7.3 7.3  HGB 11.1* 8.7*  HCT 33.7* 25.9*  MCV 89.9 89.6  PLT 302 [id number]   Basic Metabolic Panel:  Recent Labs Lab 01/06/16 1243 01/07/16 0458  NA 142 143  K 4.2 4.0  CL 106 110  CO2 26 25  GLUCOSE 108* 85  BUN 23* 24*  CREATININE 0.83 0.74  CALCIUM 9.6 8.4*   GFR: Estimated Creatinine Clearance: 44.5 mL/min (by C-G formula based on Cr of 0.74). Liver Function Tests:  Recent Labs Lab 01/06/16 1243  AST 17  ALT 17  ALKPHOS 85  BILITOT 0.4  PROT 7.0  ALBUMIN 4.0    Recent Labs Lab 01/06/16 1243  LIPASE 38   No results for input(s): AMMONIA in the last 168 hours. Coagulation Profile:  Recent Labs Lab 01/06/16 1243 01/07/16 0458  INR 2.50* 1.97*   Cardiac Enzymes: No results for input(s): CKTOTAL, CKMB, CKMBINDEX, TROPONINI in the last 168 hours. BNP (last 3 results) No results for input(s): PROBNP in the last 8760 hours. HbA1C: No results for input(s): HGBA1C in the last 72 hours. CBG: No results for input(s): GLUCAP in the last 168 hours. Lipid Profile: No results for input(s): CHOL, HDL, LDLCALC, TRIG, CHOLHDL, LDLDIRECT in the last 72 hours. Thyroid Function Tests: No results for input(s): TSH, T4TOTAL, FREET4, T3FREE, THYROIDAB in the last 72 hours. Anemia Panel: No results for input(s): VITAMINB12, FOLATE, FERRITIN, TIBC, IRON, RETICCTPCT in the last 72 hours. Urine analysis:    Component Value Date/Time   COLORURINE YELLOW 01/06/2016 1258   APPEARANCEUR CLOUDY* 01/06/2016 1258   LABSPEC 1.011 01/06/2016 1258    PHURINE 6.5 01/06/2016 1258   GLUCOSEU NEGATIVE 01/06/2016 1258   HGBUR NEGATIVE 01/06/2016 1258   [employer] 01/06/2016 [place] 01/06/2016 1258   PROTEINUR NEGATIVE 01/06/2016 1258   NITRITE NEGATIVE 01/06/2016 1258   LEUKOCYTESUR MODERATE* 01/06/2016 1258   Sepsis Labs: @LABRCNTIP (procalcitonin:4,lacticidven:4)  )No results found for this or any previous visit (from the past 240 hour(s)).       Radiology Studies: No results found.      Scheduled Meds: . anastrozole  1 mg Oral Daily  . diltiazem  60 mg Oral TID  . feeding supplement  1 Container Oral TID BM  . metoprolol  100 mg Oral BID  . [START ON 01/10/2016] pantoprazole (PROTONIX) IV  40 mg Intravenous Q12H  . sodium chloride flush  3 mL Intravenous Q12H   Continuous Infusions: . sodium chloride 75 mL/hr at 01/07/16 1748  . sodium chloride 20 mL/hr at 01/08/16 0538  . pantoprozole (PROTONIX) infusion 8 mg/hr (01/07/16 2222)     LOS: 2 days  Time spent: 35 min    Kelvin Cellar, MD [facility] Pager [phone]  If 7PM-7AM, please contact night-coverage [email or web address] Password [facility] 01/08/2016, 7:14 AM

## 2016-01-08 NOTE — Interval H&P Note (Signed)
History and Physical Interval Note:  01/08/2016 9:18 AM  Adrienne [name]  has presented today for surgery, with the diagnosis of melena, anemia  The various methods of treatment have been discussed with the patient and family. After consideration of risks, benefits and other options for treatment, the patient has consented to  Procedure(s): ESOPHAGOGASTRODUODENOSCOPY (EGD) (Left) as a surgical intervention .  The patient's history has been reviewed, patient examined, no change in status, stable for surgery.  I have reviewed the patient's chart and labs.  Questions were answered to the patient's satisfaction.     Adrienne [name] M  Assessment:  1.  Melena, anemia. 2.  Coagulopathy, corrected.  Plan:  1.  Endoscopy. 2.  Risks (bleeding, infection, bowel perforation that could require surgery, sedation-related changes in cardiopulmonary systems), benefits (identification and possible treatment of source of symptoms, exclusion of certain causes of symptoms), and alternatives (watchful waiting, radiographic imaging studies, empiric medical treatment) of upper endoscopy (EGD) were explained to patient/family in detail and patient wishes to proceed.

## 2016-01-08 NOTE — Op Note (Signed)
[facility] Patient Name: Adrienne [name] Procedure Date: 01/08/2016 MRN: [id number] Attending MD: Arta Silence , MD Date of Birth: 01-Jan-1928 CSN:  Age: 82 Admit Type: Inpatient Procedure:                Upper GI endoscopy Indications:              Acute post hemorrhagic anemia, Melena Providers:                Arta Silence, MD, Sarah Monday, RN, Corliss Parish, Technician Referring MD:              Medicines:                Fentanyl 50 micrograms IV, Midazolam 3 mg IV,                            Cetacaine spray Complications:            No immediate complications. Estimated Blood Loss:     Estimated blood loss was minimal. Procedure:                Pre-Anesthesia Assessment:                           - Prior to the procedure, a History and Physical                            was performed, and patient medications and                            allergies were reviewed. The patient's tolerance of                            previous anesthesia was also reviewed. The risks                            and benefits of the procedure and the sedation                            options and risks were discussed with the patient.                            All questions were answered, and informed consent                            was obtained. Prior Anticoagulants: The patient has                            taken Coumadin (warfarin), last dose was 2 days                            prior to procedure. ASA Grade Assessment: III - A  patient with severe systemic disease. After                            reviewing the risks and benefits, the patient was                            deemed in satisfactory condition to undergo the                            procedure.                           After obtaining informed consent, the endoscope was                            passed under direct vision. Throughout the                  procedure, the patient's blood pressure, pulse, and                            oxygen saturations were monitored continuously. The                            [id number]) scope was introduced through the                            mouth, and advanced to the second part of duodenum.                            The upper GI endoscopy was accomplished without                            difficulty. The patient tolerated the procedure                            well. Scope In: Scope Out: Findings:      A small hiatal hernia was present.      The exam of the esophagus was otherwise normal.      Mild inflammation was found in the prepyloric region of the stomach.      The exam of the stomach was otherwise normal.      A medium non-bleeding diverticulum was found in the area of the papilla.      The exam of the duodenum was otherwise normal. Impression:               - Small hiatal hernia.                           - Gastritis.                           - Non-bleeding duodenal diverticulum.                           - Bleeding might have been from colonic  diverticulosis. Moderate Sedation:      Moderate (conscious) sedation was administered by the endoscopy nurse       and supervised by the endoscopist. The following parameters were       monitored: oxygen saturation, heart rate, blood pressure, and response       to care. Recommendation:           - Return patient to hospital ward for ongoing care.                           - Continue present medications.                           - Clear liquid diet today.                           - Hold anticoagulation for the time-being.                           - [facility] will follow. If bleeding recurs, could                            consider tagged RBC study (if bleeding mostly                            hematochezia) or capsule study (if bleeding most                            melenic). Procedure  Code(s):        --- Professional ---                           [id number], Esophagogastroduodenoscopy, flexible,                            transoral; diagnostic, including collection of                            specimen(s) by brushing or washing, when performed                            (separate procedure) Diagnosis Code(s):        --- Professional ---                           K57.10, Diverticulosis of small intestine without                            perforation or abscess without bleeding                           K92.1, Melena (includes Hematochezia)                           D62, Acute posthemorrhagic anemia                           K29.70, Gastritis,  unspecified, without bleeding                           K44.9, Diaphragmatic hernia without obstruction or                            gangrene CPT copyright 2016 [employer]. All rights reserved. The codes documented in this report are preliminary and upon coder review may  be revised to meet current compliance requirements. Arta Silence, MD 01/08/2016 9:53:46 AM This report has been signed electronically. Number of Addenda: 0

## 2016-01-09 ENCOUNTER — Encounter (HOSPITAL_COMMUNITY): Payer: Self-pay | Admitting: Gastroenterology

## 2016-01-09 DIAGNOSIS — E440 Moderate protein-calorie malnutrition: Secondary | ICD-10-CM | POA: Insufficient documentation

## 2016-01-09 LAB — BASIC METABOLIC PANEL
Anion gap: 7 (ref 5–15)
BUN: 9 mg/dL (ref 6–20)
CHLORIDE: 110 mmol/L (ref 101–111)
CO2: 22 mmol/L (ref 22–32)
CREATININE: 0.68 mg/dL (ref 0.44–1.00)
Calcium: 8.2 mg/dL — ABNORMAL LOW (ref 8.9–10.3)
GFR calc non Af Amer: >60 mL/min (ref >60)
Glucose, Bld: 93 mg/dL (ref 65–99)
POTASSIUM: 3.9 mmol/L (ref 3.5–5.1)
SODIUM: 139 mmol/L (ref 135–145)

## 2016-01-09 LAB — CBC
HEMATOCRIT: 28.6 % — AB (ref 36.0–46.0)
HEMOGLOBIN: 9.5 g/dL — AB (ref 12.0–15.0)
MCH: 29.2 pg (ref 26.0–34.0)
MCHC: 33.2 g/dL (ref 30.0–36.0)
MCV: 88 fL (ref 78.0–100.0)
PLATELETS: 226 10*3/uL (ref 150–400)
RBC: 3.25 MIL/uL — AB (ref 3.87–5.11)
RDW: 14.4 % (ref 11.5–15.5)
WBC: 10 10*3/uL (ref 4.0–10.5)

## 2016-01-09 NOTE — Evaluation (Signed)
Physical Therapy Evaluation Patient Details Name: Adrienne [name] MRN: [id number] DOB: [birth year] Today's Date: 01/09/2016   History of Present Illness  82 yo female admitted with GI bleed. Hx of A fib, CAD, cardiac cath, HTN, pulm HTN, TIA, T12 comp fx, breast cancer.   Clinical Impression  On eval, pt was Min guard assist for ambulation-walked ~140 feet. Pt is unsteady at times. Increased back pain with ambulation-pt rated 5/10. Discussed follow up therapy-feel pt could benefit from OP PT. Will follow and progress activity as able. Encouraged pt to continue walking in hallways with nursing as well.     Follow Up Recommendations Outpatient PT (for strengthening, balance training, back pain tx)    Equipment Recommendations  None recommended by PT    Recommendations for Other Services       Precautions / Restrictions Precautions Precautions: Fall Restrictions Weight Bearing Restrictions: No      Mobility  Bed Mobility Overal bed mobility: Needs Assistance Bed Mobility: Supine to Sit;Sit to Supine     Supine to sit: Modified independent (Device/Increase time) Sit to supine: Modified independent (Device/Increase time)   General bed mobility comments: used bedrail  Transfers Overall transfer level: Needs assistance   Transfers: Sit to/from Stand Sit to Stand: Supervision         General transfer comment: for safety  Ambulation/Gait Ambulation/Gait assistance: Min guard Ambulation Distance (Feet): 150 Feet Assistive device: None Gait Pattern/deviations: Step-through pattern;Decreased stride length     General Gait Details: close guard for safety. unsteady but no overt LOB. Pt did report increased back pain with ambulation. dyspnea 2/4  Stairs            Wheelchair Mobility    Modified Rankin (Stroke Patients Only)       Balance Overall balance assessment: Needs assistance         Standing balance support: During functional activity Standing  balance-Leahy Scale: Good                               Pertinent Vitals/Pain Pain Assessment: 0-10 Pain Score: 5  Pain Location: mid-low back and down towards R pelvis Pain Descriptors / Indicators: Sore Pain Intervention(s): Monitored during session;Repositioned    Home Living Family/patient expects to be discharged to:: Private residence Living Arrangements: Spouse/significant other   Type of Home: House Home Access: Stairs to enter   Technical brewer of Steps: 1 small step Home Layout: One level Home Equipment: None      Prior Function Level of Independence: Independent               Hand Dominance        Extremity/Trunk Assessment   Upper Extremity Assessment: Overall WFL for tasks assessed           Lower Extremity Assessment: Generalized weakness      Cervical / Trunk Assessment: Normal  Communication   Communication: No difficulties  Cognition Arousal/Alertness: Awake/alert Behavior During Therapy: WFL for tasks assessed/performed Overall Cognitive Status: Within Functional Limits for tasks assessed                      General Comments      Exercises        Assessment/Plan    PT Assessment Patient needs continued PT services  PT Diagnosis Difficulty walking;Generalized weakness   PT Problem List Decreased strength;Decreased activity tolerance;Decreased balance;Decreased mobility;Pain  PT Treatment Interventions Gait training;Functional mobility training;Therapeutic  activities;Patient/family education;Balance training;Therapeutic exercise   PT Goals (Current goals can be found in the Care Plan section) Acute Rehab PT Goals Patient Stated Goal: home soon.  PT Goal Formulation: With patient/family Time For Goal Achievement: 01/23/16 Potential to Achieve Goals: Good    Frequency Min 3X/week   Barriers to discharge        Co-evaluation               End of Session Equipment Utilized During  Treatment: Gait belt Activity Tolerance: Patient tolerated treatment well Patient left: in bed;with call bell/phone within reach;with family/visitor present           Time: 0950-1004 PT Time Calculation (min) (ACUTE ONLY): 14 min   Charges:   PT Evaluation $PT Eval Low Complexity: 1 Procedure     PT G Codes:        Weston Anna, MPT Pager: [phone]

## 2016-01-09 NOTE — Progress Notes (Signed)
[facility] Progress Note  Subjective: No further bleeding according to her. She has a hx of diverticulosis, and last colonoscopy was 5 years ago [place]  Objective: Vital signs in last 24 hours: Temp:  [97.7 F (36.5 C)-98.7 F (37.1 C)] 98.7 F (37.1 C) (04/29 0553) Pulse Rate:  [67-84] 84 (04/29 0929) Resp:  [20] 20 (04/29 0553) BP: (137-155)/(51-61) 140/58 mmHg (04/29 0929) SpO2:  [97 %-100 %] 98 % (04/29 0553) Weight change:    PE: No distress Heart RRR Lungs clear Abdomen soft and non tender  Lab Results: Results for orders placed or performed during the hospital encounter of 01/06/16 (from the past 24 hour(s))  Basic metabolic panel     Status: Abnormal   Collection Time: 01/09/16  4:33 AM  Result Value Ref Range   Sodium 139 135 - 145 mmol/L   Potassium 3.9 3.5 - 5.1 mmol/L   Chloride 110 101 - 111 mmol/L   CO2 22 22 - 32 mmol/L   Glucose, Bld 93 65 - 99 mg/dL   BUN 9 6 - 20 mg/dL   Creatinine, Ser 0.68 0.44 - 1.00 mg/dL   Calcium 8.2 (L) 8.9 - 10.3 mg/dL   GFR calc non Af Amer >60 >60 mL/min   GFR calc Af Amer >60 >60 mL/min   Anion gap 7 5 - 15  CBC     Status: Abnormal   Collection Time: 01/09/16  4:33 AM  Result Value Ref Range   WBC 10.0 4.0 - 10.5 K/uL   RBC 3.25 (L) 3.87 - 5.11 MIL/uL   Hemoglobin 9.5 (L) 12.0 - 15.0 g/dL   HCT 28.6 (L) 36.0 - 46.0 %   MCV 88.0 78.0 - 100.0 fL   MCH 29.2 26.0 - 34.0 pg   MCHC 33.2 30.0 - 36.0 g/dL   RDW 14.4 11.5 - 15.5 %   Platelets 226 150 - 400 K/uL    Studies/Results: No results found.    Assessment: GI bleed.   Plan:   Observe for now. Advance diet.    Cassell Clement 01/09/2016, 12:37 PM  Pager: [phone] If no answer or after 5 PM call [phone] Lab Results  Component Value Date   HGB 9.5* 01/09/2016   HGB 10.8* 01/08/2016   HGB 8.7* 01/07/2016   HCT 28.6* 01/09/2016   HCT 32.2* 01/08/2016   HCT 25.9* 01/07/2016   ALKPHOS 85 01/06/2016   AST 17 01/06/2016   ALT 17  01/06/2016

## 2016-01-09 NOTE — Progress Notes (Signed)
Patient with soft brown stool. No signs of blood in stool. Will continue to monitor.

## 2016-01-09 NOTE — Progress Notes (Signed)
PROGRESS NOTE    Adrienne [name]  [id number] DOB: 1928/01/08 DOA: 01/06/2016 PCP: Tawanna Solo, MD Outpatient Specialists:   Brief Narrative: Adrienne [name] is a pleasant 82 year old female with a history of diverticulosis, peptic ulcer disease, atrial fibrillation on chronic anticoagulation with warfarin, who was admitted to the medicine service on 01/06/2016 when she presented with complaints of bloody stools. Initial lab work revealed a hemoglobin of 11.1. INR was in the therapeutic range at 2.5. She was given 10 mg of vitamin K warfarin was discontinued. She was admitted to telemetry. Overnight she had several episodes of bloody stools. On the following day her hemoglobin trended down to 8.7. Dr. Paulita Fujita of GI was consulted. Suspect source of GI bleed to be diverticular.    Assessment & Plan:   Principal Problem:   GI bleed Active Problems:   Afib (HCC)   Chronic atrial fibrillation (HCC)   Pulmonary hypertension, secondary (HCC)   Chronic anticoagulation   Hyperlipidemia   Essential hypertension   Malnutrition of moderate degree   1.   Suspected lower GI bleed. -Adrienne [name] is an 82 year old female with a history of diverticulosis, history of peptic ulcer disease, had been anticoagulated with warfarin for A. Fib. -Presented with complaints of bright red blood per rectum, initially describing dark/black stools than becoming grossly bloody. -She had a CT scan of abdomen and pelvis with contrast 12/25/2015 that revealed diverticulosis. She had stated her history having peptic ulcer disease as well. Lab work this morning showing BUN of 24, which may be more indicative of lower GI bleed -Coumadin discontinued and is being reversed, given additional vitamin K on 01/07/2016.  -On 01/08/2016 she underwent upper endoscopy which did not reveal upper GI source of bleed. Protonix drip was discontinued  -GI recommending bleeding scan if GI bleeding recurs (bleeding scan for bright red  blood per rectum or capsule study for melena)   2.  Acute blood loss anemia. -Hemoglobin dropping from 11.1 on admission to 8.7 on a.m. lab work on 01/07/2016 -She reports having decrease in bloody stools -She was transfused 1 unit of packed red blood cells and 01/07/2016 given significant drop in hemoglobin. -Hemoglobin 9.5 on 01/09/2016, did not bleed overnight  3.  History of atrial fibrillation. -Had been anticoagulated with warfarin presenting with a therapeutic INR of 2.9. INR trending down to 1.97 on a.m. lab work however continued to bleed overnight. -Will give 10 mg of IV vitamin K now -She remains rate controlled -Continue metoprolol 100 mg twice a day and Cardizem 60 mg by mouth 3 times a day  4.  Hypertension. -Continue Cardizem 60 mg by mouth 3 times a day and metoprolol 100 mg by mouth twice a day -Blood pressures stable  5.  Deconditioning -Mobilization TID, she was ambulated down the hallway this morning  DVT prophylaxis: SCD's Code Status: Full code Family Communication: Spoke to her husband was present at bedside Disposition Plan: GI following   Consultants:   GI  Procedures:  EGD performed on 01/09/2016 impression:                           - Small hiatal hernia.  - Gastritis.  - Non-bleeding duodenal diverticulum.  - Bleeding might have been from colonic   diverticulosis.   Subjective: She reports feeling weak, reporting minimal activity in the past 2 days staying in bed mostly. She was assisted out of bed and ambulated down the hallway and back  Objective: Filed  Vitals:   01/08/16 1335 01/08/16 2039 01/09/16 0553 01/09/16 0929  BP: 155/61 137/56 146/51 140/58  Pulse: 84 67 80 84  Temp: 97.7 F (36.5 C) 98.1 F (36.7 C) 98.7 F (37.1 C)   TempSrc: Oral Oral Oral   Resp: 20 20 20    Height:      Weight:      SpO2: 97% 100% 98%      Intake/Output Summary (Last 24 hours) at 01/09/16 1030 Last data filed at 01/09/16 0700  Gross per 24 hour  Intake   2100 ml  Output    200 ml  Net   1900 ml   Filed Weights   01/06/16 1156 01/06/16 1551  Weight: 69.854 kg (154 lb) 67.7 kg (149 lb 4 oz)    Examination:  General exam: Looks better on this mornings examination, she is awake and alert Respiratory system: Clear to auscultation. Respiratory effort normal. Cardiovascular system: S1 & S2 heard, RRR. No JVD, murmurs, rubs, gallops or clicks. No pedal edema. Gastrointestinal system: Abdomen is nondistended, soft and nontender. No organomegaly or masses felt. Normal bowel sounds heard. Central nervous system: Alert and oriented. No focal neurological deficits. Extremities: Symmetric 5 x 5 power. Skin: No rashes, lesions or ulcers Psychiatry: Judgement and insight appear normal. Mood & affect appropriate.     Data Reviewed: I have personally reviewed following labs and imaging studies  CBC:  Recent Labs Lab 01/06/16 1243 01/07/16 0458 01/08/16 0655 01/09/16 0433  WBC 7.3 7.3 8.4 10.0  HGB 11.1* 8.7* 10.8* 9.5*  HCT 33.7* 25.9* 32.2* 28.6*  MCV 89.9 89.6 89.0 88.0  PLT 302 245 233 [id number]   Basic Metabolic Panel:  Recent Labs Lab 01/06/16 1243 01/07/16 0458 01/09/16 0433  NA 142 143 139  K 4.2 4.0 3.9  CL 106 110 110  CO2 26 25 22   GLUCOSE 108* 85 93  BUN 23* 24* 9  CREATININE 0.83 0.74 0.68  CALCIUM 9.6 8.4* 8.2*   GFR: Estimated Creatinine Clearance: 44.5 mL/min (by C-G formula based on Cr of 0.68). Liver Function Tests:  Recent Labs Lab 01/06/16 1243  AST 17  ALT 17  ALKPHOS 85  BILITOT 0.4  PROT 7.0  ALBUMIN 4.0    Recent Labs Lab 01/06/16 1243  LIPASE 38   No results for input(s): AMMONIA in the last 168 hours. Coagulation Profile:  Recent Labs Lab 01/06/16 1243 01/07/16 0458 01/08/16 0655  INR 2.50* 1.97* 1.25   Cardiac Enzymes: No results for input(s): CKTOTAL,  CKMB, CKMBINDEX, TROPONINI in the last 168 hours. BNP (last 3 results) No results for input(s): PROBNP in the last 8760 hours. HbA1C: No results for input(s): HGBA1C in the last 72 hours. CBG: No results for input(s): GLUCAP in the last 168 hours. Lipid Profile: No results for input(s): CHOL, HDL, LDLCALC, TRIG, CHOLHDL, LDLDIRECT in the last 72 hours. Thyroid Function Tests: No results for input(s): TSH, T4TOTAL, FREET4, T3FREE, THYROIDAB in the last 72 hours. Anemia Panel: No results for input(s): VITAMINB12, FOLATE, FERRITIN, TIBC, IRON, RETICCTPCT in the last 72 hours. Urine analysis:    Component Value Date/Time   COLORURINE YELLOW 01/06/2016 1258   APPEARANCEUR CLOUDY* 01/06/2016 1258   LABSPEC 1.011 01/06/2016 1258   PHURINE 6.5 01/06/2016 1258   GLUCOSEU NEGATIVE 01/06/2016 1258   HGBUR NEGATIVE 01/06/2016 [place] 01/06/2016 [place] 01/06/2016 1258   PROTEINUR NEGATIVE 01/06/2016 1258   NITRITE NEGATIVE 01/06/2016 1258   LEUKOCYTESUR MODERATE* 01/06/2016 1258  Sepsis Labs: @LABRCNTIP (procalcitonin:4,lacticidven:4)  )No results found for this or any previous visit (from the past 240 hour(s)).       Radiology Studies: No results found.      Scheduled Meds: . anastrozole  1 mg Oral Daily  . diltiazem  60 mg Oral TID  . feeding supplement  1 Container Oral TID BM  . metoprolol  100 mg Oral BID  . pantoprazole  40 mg Oral Daily  . sodium chloride flush  3 mL Intravenous Q12H   Continuous Infusions: . sodium chloride 75 mL/hr at 01/08/16 2333     LOS: 3 days    Time spent: 25 min    Kelvin Cellar, MD [facility] Pager [phone]  If 7PM-7AM, please contact night-coverage [email or web address] Password [email or web address] 01/09/2016, 10:30 AM

## 2016-01-10 DIAGNOSIS — K5793 Diverticulitis of intestine, part unspecified, without perforation or abscess with bleeding: Secondary | ICD-10-CM

## 2016-01-10 LAB — BASIC METABOLIC PANEL
Anion gap: 7 (ref 5–15)
BUN: 9 mg/dL (ref 6–20)
CHLORIDE: 110 mmol/L (ref 101–111)
CO2: 23 mmol/L (ref 22–32)
CREATININE: 0.69 mg/dL (ref 0.44–1.00)
Calcium: 8.3 mg/dL — ABNORMAL LOW (ref 8.9–10.3)
GFR calc Af Amer: >60 mL/min (ref >60)
GFR calc non Af Amer: >60 mL/min (ref >60)
GLUCOSE: 117 mg/dL — AB (ref 65–99)
Potassium: 3.9 mmol/L (ref 3.5–5.1)
Sodium: 140 mmol/L (ref 135–145)

## 2016-01-10 LAB — CBC
HEMATOCRIT: 26.4 % — AB (ref 36.0–46.0)
HEMOGLOBIN: 8.9 g/dL — AB (ref 12.0–15.0)
MCH: 30.4 pg (ref 26.0–34.0)
MCHC: 33.7 g/dL (ref 30.0–36.0)
MCV: 90.1 fL (ref 78.0–100.0)
Platelets: 213 10*3/uL (ref 150–400)
RBC: 2.93 MIL/uL — AB (ref 3.87–5.11)
RDW: 14.6 % (ref 11.5–15.5)
WBC: 9.5 10*3/uL (ref 4.0–10.5)

## 2016-01-10 NOTE — Progress Notes (Signed)
The patient is doing well today. Denies any further gastrointestinal bleeding. She is eating well. We talked about her going home and she is okay with this. She would like to follow-up with Dr. Paulita Fujita as an outpatient to discuss whether or not she might be another colonoscopy in the future. From my standpoint I think she is stable for discharge. Follow-up with Dr. Paulita Fujita as well as her PCP.

## 2016-01-10 NOTE — Progress Notes (Signed)
This nurse assumed care of patient at 1300. Agree with previous RN's assessment. 

## 2016-01-10 NOTE — Care Management Important Message (Signed)
Important Message  Patient Details  Name: Adrienne [name] MRN: [id number] Date of Birth: 1928-07-19   Medicare Important Message Given:  Yes    Erenest Rasher, RN 01/10/2016, 3:32 PM

## 2016-01-10 NOTE — Discharge Summary (Signed)
Physician Discharge Summary  Adrienne [name] [id number] DOB: 1928/06/12 DOA: 01/06/2016  PCP: Tawanna Solo, MD  Admit date: 01/06/2016 Discharge date: 01/10/2016  Time spent: 35 minutes  Recommendations for Outpatient Follow-up:  1. Please follow-up on CBC on hospital follow-up visit, she was admitted for GI bleed suspected to be secondary to diverticular bleed precipitated by anticoagulation. On day of discharge she had a hemoglobin of 8.9. She had been transfused with 1 unit of packed red blood cells during this hospitalization. 2. Her Coumadin was held due to GI bleed, please reassess anticoagulation on her follow-up visit    Discharge Diagnoses:  Principal Problem:   GI bleed Active Problems:   Afib ([place])   Chronic atrial fibrillation ([place])   Pulmonary hypertension, secondary ([place])   Chronic anticoagulation   Hyperlipidemia   Essential hypertension   Malnutrition of moderate degree   Discharge Condition: Stable  Diet recommendation: Heart healthy  Filed Weights   01/06/16 1156 01/06/16 1551  Weight: 69.854 kg (154 lb) 67.7 kg (149 lb 4 oz)    History of present illness:  Adrienne [name] is a 82 y.o. female with medical history significant of diverticulosis, peptic ulcer disease, atrial fibrillation on chronic anticoagulation with warfarin, coronary artery disease status post cardiac catheterization fimbria 2013, dyslipidemia, hypertension, presenting to the emergency room with complaints of bloody stools. She states that she first noted black tarry stools on Sunday then this morning had an episode of large bloody diarrhea. She denies taking NSAIDS. She reports having vague abdominal pain in the right upper quadrant and left lower quadrants. She denies chest pain, shortness of breath, palpitations, hematuria, dysuria, syncope or presyncope. She recently moved to the area from [place], resides in the community with her husband  ED Course: Lab work in the emergency  department included a CBC that showed a hemoglobin of 11.1 with hematocrit 33.7. INR was therapeutic at 2.5. Emergency room provider discussed case with GI who will consult  Hospital Course:  Adrienne [name] is a pleasant 82 year old female with a history of diverticulosis, peptic ulcer disease, atrial fibrillation on chronic anticoagulation with warfarin, who was admitted to the medicine service on 01/06/2016 when she presented with complaints of bloody stools. Initial lab work revealed a hemoglobin of 11.1. INR was in the therapeutic range at 2.5. She was given 10 mg of vitamin K warfarin was discontinued. She was admitted to telemetry. Overnight she had several episodes of bloody stools. On the following day her hemoglobin trended down to 8.7. Dr. Paulita Fujita of GI was consulted. Suspect source of GI bleed to be diverticular. She was transfuse 1 unit of packed red blood cells. On 01/08/2016 she underwent upper endoscopy which did not reveal upper GI source of bleed. Suspect anticoagulation likely precipitating diverticular bleed. She did not have further episodes of GI bleed for the rest of her hospitalization. On day of discharge her hemoglobin was stable at 8.9. On discharge she was instructed not to take Coumadin until follow up with her primary care physician. Please reassess anticoagulation on hospital follow-up visit. At that time I also recommend repeating CBC. He was discharged to home in stable condition on 01/10/2016  Procedures:  EGD performed on 01/09/2016 impression:  - Small hiatal hernia.  - Gastritis.  - Non-bleeding duodenal diverticulum.  - Bleeding might have been from colonic   diverticulosis.  Consultations:  GI  Discharge Exam: Filed Vitals:   01/09/16 1430 01/09/16 2018  BP: 149/57 152/73  Pulse: 71 94  Temp: 98 F (36.7  C) 98 F (36.7 C)  Resp: 20  20    General exam: Looks well in no acute distress, denies further episodes of bleeding Respiratory system: Clear to auscultation. Respiratory effort normal. Cardiovascular system: S1 & S2 heard, RRR. No JVD, murmurs, rubs, gallops or clicks. No pedal edema. Gastrointestinal system: Abdomen is nondistended, soft and nontender. No organomegaly or masses felt. Normal bowel sounds heard. Central nervous system: Alert and oriented. No focal neurological deficits. Extremities: Symmetric 5 x 5 power. Skin: No rashes, lesions or ulcers Psychiatry: Judgement and insight appear normal. Mood & affect appropriate.   Discharge Instructions   Discharge Instructions    Call MD for:  difficulty breathing, headache or visual disturbances    Complete by:  As directed      Call MD for:  extreme fatigue    Complete by:  As directed      Call MD for:  hives    Complete by:  As directed      Call MD for:  persistant dizziness or light-headedness    Complete by:  As directed      Call MD for:  persistant nausea and vomiting    Complete by:  As directed      Call MD for:  redness, tenderness, or signs of infection (pain, swelling, redness, odor or green/yellow discharge around incision site)    Complete by:  As directed      Call MD for:  severe uncontrolled pain    Complete by:  As directed      Call MD for:  temperature >100.4    Complete by:  As directed      Call MD for:    Complete by:  As directed      Diet - low sodium heart healthy    Complete by:  As directed      Increase activity slowly    Complete by:  As directed           Current Discharge Medication List    CONTINUE these medications which have NOT CHANGED   Details  acetaminophen (TYLENOL) 500 MG tablet Take 1,000 mg by mouth 2 (two) times daily as needed for mild pain, moderate pain, fever or headache.    anastrozole (ARIMIDEX) 1 MG tablet Take 1 mg by mouth daily.    CALCIUM CITRATE PO Take 630 mg by mouth 2 (two) times  daily.    Cholecalciferol (VITAMIN D PO) Take 1,900 mg by mouth daily.     diltiazem (CARDIZEM) 60 MG tablet Take 1 tablet (60 mg total) by mouth 3 (three) times daily. Qty: 270 tablet, Refills: 3    HYDROcodone-acetaminophen (NORCO/VICODIN) 5-325 MG tablet Take 1 tablet by mouth every 8 (eight) hours as needed for moderate pain.    metoprolol (LOPRESSOR) 100 MG tablet Take 1 tablet (100 mg total) by mouth 2 (two) times daily. Qty: 180 tablet, Refills: 3    omeprazole (PRILOSEC) 40 MG capsule Take 40 mg by mouth daily.     trimethoprim (TRIMPEX) 100 MG tablet Take 1 tablet by mouth daily.  Refills: 11    vitamin C (ASCORBIC ACID) 500 MG tablet Take 500 mg by mouth 2 (two) times daily.      STOP taking these medications     warfarin (COUMADIN) 4 MG tablet      warfarin (COUMADIN) 5 MG tablet        Allergies  Allergen Reactions  . Amiodarone Hcl [Amiodarone]     AFIB  .  Compazine [Prochlorperazine]     HYPER  . Other Other (See Comments)    "Kidney dye"--patient states this gave her severe shakes/chills  . Thorazine [Chlorpromazine]     HYPER  . Valium [Diazepam]     HYPER   . Zometa [Zoledronic Acid]     PROBLEMS WITH EYES   Follow-up Information    Follow up with Landry Dyke, MD In 1 week.   Specialty:  Gastroenterology   Contact information:   [id number] [place] [address] [phone]       Follow up with Tawanna Solo, MD In 2 weeks.   Specialty:  Family Medicine   Contact information:   [place] [address] [phone]        The results of significant diagnostics from this hospitalization (including imaging, microbiology, ancillary and laboratory) are listed below for reference.    Significant Diagnostic Studies: Mr Lumbar Spine Wo Contrast  01/02/2016  CLINICAL DATA:  Low back pain with a burning sensation in the mid and lower back radiating into both hips. Initial encounter. EXAM: MRI LUMBAR  SPINE WITHOUT CONTRAST TECHNIQUE: Multiplanar, multisequence MR imaging of the lumbar spine was performed. No intravenous contrast was administered. COMPARISON:  CT abdomen and pelvis 12/25/2015 plain films lumbar spine 12/24/2015. FINDINGS: The patient has a mild inferior endplate compression fracture of T12 with vertebral body height loss of approximately 5%. There is marrow edema in T12 consistent with acute or subacute injury. Minimal marrow edema is also identified in the inferior endplate of [id number] which may be due to stress change. No other acute fracture is identified. Scattered Schmorl's nodes are seen. Trace retrolisthesis T12 on L1 and anterolisthesis L3 on L4 is identified. Alignment is otherwise normal. The conus medullaris is normal in signal and position. Imaged intra-abdominal contents demonstrate tiny T2 hyperintense lesion in the posterior right hepatic lobe most consistent with a cyst, unchanged. Abnormal axis orientation of the right kidney is also noted, unchanged. T10-11 is imaged in sagittal plane only. The central canal and foramina are widely patent with a minimal disc bulge noted. T11-12: Mild facet degenerative change and a very shallow right paracentral protrusion without central canal or foraminal narrowing. T12-L1: There is facet degenerative change and very mild disc bulge with endplate spur. The central canal and foramina are patent. L1-2: Very shallow disc bulge without central canal or foraminal stenosis. L2-3: There is facet degenerative change and a shallow disc bulge without central canal or foraminal narrowing. L3-4: Moderate facet degenerative change and a shallow disc bulge are identified. Mild central canal narrowing is seen. The foramina are open. L4-5: Shallow broad-based right paracentral protrusion without central canal narrowing. There is a protrusion containing gas just beyond the right foramen which could impact the right L4 root. The central canal and left foramen are  open. L5-S1: Small annular fissure and minimal disc bulge without central canal or foraminal stenosis. IMPRESSION: Findings consistent with an acute or subacute inferior endplate compression fracture of T12 with vertebral body height loss of approximately 5% in no bony retropulsion. Minimal marrow edema in the inferior endplate of T11 may be due to stress change. Disc protrusion containing gas just beyond the right foramen at L4-5 could impact the exited right L4 root. Mild central canal narrowing L3-4 due to a shallow disc bulge. Facet degenerative change is also seen at this level. Electronically Signed   By: Inge Rise M.D.   On: 01/02/2016 12:10   Ct Abdomen  Pelvis W Contrast  12/25/2015  CLINICAL DATA:  RIGHT lower quadrant abdominal pain for 2 weeks, finished antibiotics 3-4 days ago but still having pain and diarrhea, history kidney stones, breast cancer post RIGHT mastectomy, appendectomy, cholecystectomy, coronary artery disease, hypertension, former smoker, pulmonary hypertension EXAM: CT ABDOMEN AND PELVIS WITH CONTRAST TECHNIQUE: Multidetector CT imaging of the abdomen and pelvis was performed using the standard protocol following bolus administration of intravenous contrast. Sagittal and coronal MPR images reconstructed from axial data set. CONTRAST:  136mL ISOVUE-300 IOPAMIDOL (ISOVUE-300) INJECTION 61% IV. Oral contrast was not administered. COMPARISON:  11/24/2015 FINDINGS: Lung bases clear. 4 mm nonspecific low-attenuation focus RIGHT lobe liver image 34 unchanged. Liver, spleen, pancreas, kidneys, and adrenal glands otherwise normal. Tiny renal calculi seen on the previous exam are not identified on current contrast-enhanced study. Gallbladder and appendix surgically absent by history. Normal appearing bladder and ureters. Diverticulosis of descending and sigmoid colon without wall thickening to suggest acute diverticulitis. Pessary within vagina. Atrophic uterus and adnexa. Stomach and  bowel loops otherwise normal appearance. No mass, adenopathy, free air or free fluid. Bones demineralized. IMPRESSION: Distal colonic diverticulosis without evidence of diverticulitis. No definite acute intra-abdominal or intrapelvic abnormalities. Electronically Signed   By: Lavonia Dana M.D.   On: 12/25/2015 11:40    Microbiology: No results found for this or any previous visit (from the past 240 hour(s)).   Labs: Basic Metabolic Panel:  Recent Labs Lab 01/06/16 1243 01/07/16 0458 01/09/16 0433 01/10/16 0517  NA 142 143 139 140  K 4.2 4.0 3.9 3.9  CL 106 110 110 110  CO2 26 25 22 23   GLUCOSE 108* 85 93 117*  BUN 23* 24* 9 9  CREATININE 0.83 0.74 0.68 0.69  CALCIUM 9.6 8.4* 8.2* 8.3*   Liver Function Tests:  Recent Labs Lab 01/06/16 1243  AST 17  ALT 17  ALKPHOS 85  BILITOT 0.4  PROT 7.0  ALBUMIN 4.0    Recent Labs Lab 01/06/16 1243  LIPASE 38   No results for input(s): AMMONIA in the last 168 hours. CBC:  Recent Labs Lab 01/06/16 1243 01/07/16 0458 01/08/16 0655 01/09/16 0433 01/10/16 0517  WBC 7.3 7.3 8.4 10.0 9.5  HGB 11.1* 8.7* 10.8* 9.5* 8.9*  HCT 33.7* 25.9* 32.2* 28.6* 26.4*  MCV 89.9 89.6 89.0 88.0 90.1  PLT 302 245 233 226 213   Cardiac Enzymes: No results for input(s): CKTOTAL, CKMB, CKMBINDEX, TROPONINI in the last 168 hours. BNP: BNP (last 3 results) No results for input(s): BNP in the last 8760 hours.  ProBNP (last 3 results) No results for input(s): PROBNP in the last 8760 hours.  CBG: No results for input(s): GLUCAP in the last 168 hours.     Signed:  Kelvin Cellar MD.  [facility] 01/10/2016, 1:45 PM

## 2016-01-11 DIAGNOSIS — M79671 Pain in right foot: Secondary | ICD-10-CM | POA: Diagnosis not present

## 2016-01-11 DIAGNOSIS — M542 Cervicalgia: Secondary | ICD-10-CM | POA: Diagnosis not present

## 2016-01-11 NOTE — Care Management Note (Signed)
Case Management Note  Patient Details  Name: Henryetta Guastella MRN: [id number] Date of Birth: 03/13/1928  Subjective/Objective:    GI Bleed                Action/Plan: Discharge Planning: AVS reviewed:  Late entry 01/10/2016 1620 NCM spoke to pt and husband at bedside. Husband states he goes to [facility] for [employer] Pt states she prefers the same facility. States she is ambulatory without DME.  01/11/2016 1020 NCM contacted [facility] to arranged PT. Fax # [phone], faxed referral. They will contact pt to arrange appt.   Expected Discharge Date: 01/10/2016             Expected Discharge Plan:  Home/Self Care  In-House Referral:  NA  Discharge planning Services  CM Consult  Post Acute Care Choice:  NA Choice offered to:  NA  DME Arranged:  N/A DME Agency:  NA  HH Arranged:  NA HH Agency:  NA  Status of Service:  Completed, signed off  Medicare Important Message Given:  Yes Date Medicare IM Given:    Medicare IM give by:    Date Additional Medicare IM Given:    Additional Medicare Important Message give by:     If discussed at [place] of Stay Meetings, dates discussed:    Additional Comments:  Erenest Rasher, RN 01/11/2016, 10:24 AM

## 2016-01-15 DIAGNOSIS — I4891 Unspecified atrial fibrillation: Secondary | ICD-10-CM | POA: Diagnosis not present

## 2016-01-15 DIAGNOSIS — K922 Gastrointestinal hemorrhage, unspecified: Secondary | ICD-10-CM | POA: Diagnosis not present

## 2016-01-15 DIAGNOSIS — M4854XA Collapsed vertebra, not elsewhere classified, thoracic region, initial encounter for fracture: Secondary | ICD-10-CM | POA: Diagnosis not present

## 2016-01-15 DIAGNOSIS — S139XXA Sprain of joints and ligaments of unspecified parts of neck, initial encounter: Secondary | ICD-10-CM | POA: Diagnosis not present

## 2016-01-18 DIAGNOSIS — M6281 Muscle weakness (generalized): Secondary | ICD-10-CM | POA: Diagnosis not present

## 2016-01-18 DIAGNOSIS — R269 Unspecified abnormalities of gait and mobility: Secondary | ICD-10-CM | POA: Diagnosis not present

## 2016-01-18 DIAGNOSIS — M542 Cervicalgia: Secondary | ICD-10-CM | POA: Diagnosis not present

## 2016-01-20 DIAGNOSIS — Z7901 Long term (current) use of anticoagulants: Secondary | ICD-10-CM | POA: Diagnosis not present

## 2016-01-20 DIAGNOSIS — M542 Cervicalgia: Secondary | ICD-10-CM | POA: Diagnosis not present

## 2016-01-20 DIAGNOSIS — D509 Iron deficiency anemia, unspecified: Secondary | ICD-10-CM | POA: Diagnosis not present

## 2016-01-21 DIAGNOSIS — M6281 Muscle weakness (generalized): Secondary | ICD-10-CM | POA: Diagnosis not present

## 2016-01-21 DIAGNOSIS — M542 Cervicalgia: Secondary | ICD-10-CM | POA: Diagnosis not present

## 2016-01-21 DIAGNOSIS — R269 Unspecified abnormalities of gait and mobility: Secondary | ICD-10-CM | POA: Diagnosis not present

## 2016-01-25 ENCOUNTER — Encounter (HOSPITAL_COMMUNITY)
Admission: EM | Disposition: A | Discharge status: Rehabilitation Facility | Payer: Self-pay | Source: Home / Self Care | Attending: Neurology

## 2016-01-25 ENCOUNTER — Emergency Department (HOSPITAL_COMMUNITY): Payer: Medicare Other | Admitting: Anesthesiology

## 2016-01-25 ENCOUNTER — Emergency Department (HOSPITAL_COMMUNITY): Payer: Medicare Other

## 2016-01-25 ENCOUNTER — Inpatient Hospital Stay (HOSPITAL_COMMUNITY): Payer: Medicare Other

## 2016-01-25 ENCOUNTER — Encounter (HOSPITAL_COMMUNITY): Payer: Self-pay

## 2016-01-25 ENCOUNTER — Inpatient Hospital Stay (HOSPITAL_COMMUNITY)
Admission: EM | Admit: 2016-01-25 | Discharge: 2016-01-29 | DRG: 023 | Disposition: A | Discharge status: Rehabilitation Facility | Payer: Medicare Other | Attending: Neurology | Admitting: Neurology

## 2016-01-25 ENCOUNTER — Inpatient Hospital Stay: Admit: 2016-01-25 | Payer: Self-pay | Admitting: Interventional Radiology

## 2016-01-25 DIAGNOSIS — R269 Unspecified abnormalities of gait and mobility: Secondary | ICD-10-CM | POA: Diagnosis not present

## 2016-01-25 DIAGNOSIS — R29723 NIHSS score 23: Secondary | ICD-10-CM | POA: Diagnosis present

## 2016-01-25 DIAGNOSIS — W19XXXA Unspecified fall, initial encounter: Secondary | ICD-10-CM | POA: Diagnosis not present

## 2016-01-25 DIAGNOSIS — I63312 Cerebral infarction due to thrombosis of left middle cerebral artery: Secondary | ICD-10-CM | POA: Diagnosis not present

## 2016-01-25 DIAGNOSIS — K922 Gastrointestinal hemorrhage, unspecified: Secondary | ICD-10-CM | POA: Insufficient documentation

## 2016-01-25 DIAGNOSIS — I69354 Hemiplegia and hemiparesis following cerebral infarction affecting left non-dominant side: Secondary | ICD-10-CM | POA: Diagnosis not present

## 2016-01-25 DIAGNOSIS — Z7901 Long term (current) use of anticoagulants: Secondary | ICD-10-CM | POA: Diagnosis not present

## 2016-01-25 DIAGNOSIS — Z87891 Personal history of nicotine dependence: Secondary | ICD-10-CM | POA: Diagnosis not present

## 2016-01-25 DIAGNOSIS — I639 Cerebral infarction, unspecified: Secondary | ICD-10-CM | POA: Diagnosis not present

## 2016-01-25 DIAGNOSIS — K5791 Diverticulosis of intestine, part unspecified, without perforation or abscess with bleeding: Secondary | ICD-10-CM | POA: Diagnosis not present

## 2016-01-25 DIAGNOSIS — I4819 Other persistent atrial fibrillation: Secondary | ICD-10-CM | POA: Insufficient documentation

## 2016-01-25 DIAGNOSIS — I2583 Coronary atherosclerosis due to lipid rich plaque: Secondary | ICD-10-CM | POA: Diagnosis present

## 2016-01-25 DIAGNOSIS — J9690 Respiratory failure, unspecified, unspecified whether with hypoxia or hypercapnia: Secondary | ICD-10-CM

## 2016-01-25 DIAGNOSIS — I6350 Cerebral infarction due to unspecified occlusion or stenosis of unspecified cerebral artery: Secondary | ICD-10-CM

## 2016-01-25 DIAGNOSIS — I4891 Unspecified atrial fibrillation: Secondary | ICD-10-CM | POA: Diagnosis not present

## 2016-01-25 DIAGNOSIS — I10 Essential (primary) hypertension: Secondary | ICD-10-CM | POA: Diagnosis not present

## 2016-01-25 DIAGNOSIS — E785 Hyperlipidemia, unspecified: Secondary | ICD-10-CM | POA: Diagnosis present

## 2016-01-25 DIAGNOSIS — Z853 Personal history of malignant neoplasm of breast: Secondary | ICD-10-CM | POA: Diagnosis not present

## 2016-01-25 DIAGNOSIS — K219 Gastro-esophageal reflux disease without esophagitis: Secondary | ICD-10-CM

## 2016-01-25 DIAGNOSIS — I481 Persistent atrial fibrillation: Secondary | ICD-10-CM | POA: Diagnosis not present

## 2016-01-25 DIAGNOSIS — Z823 Family history of stroke: Secondary | ICD-10-CM

## 2016-01-25 DIAGNOSIS — G8191 Hemiplegia, unspecified affecting right dominant side: Secondary | ICD-10-CM | POA: Diagnosis present

## 2016-01-25 DIAGNOSIS — J9600 Acute respiratory failure, unspecified whether with hypoxia or hypercapnia: Secondary | ICD-10-CM | POA: Diagnosis not present

## 2016-01-25 DIAGNOSIS — R414 Neurologic neglect syndrome: Secondary | ICD-10-CM | POA: Diagnosis present

## 2016-01-25 DIAGNOSIS — Z888 Allergy status to other drugs, medicaments and biological substances status: Secondary | ICD-10-CM

## 2016-01-25 DIAGNOSIS — N3091 Cystitis, unspecified with hematuria: Secondary | ICD-10-CM | POA: Diagnosis not present

## 2016-01-25 DIAGNOSIS — I340 Nonrheumatic mitral (valve) insufficiency: Secondary | ICD-10-CM | POA: Diagnosis present

## 2016-01-25 DIAGNOSIS — T148 Other injury of unspecified body region: Secondary | ICD-10-CM | POA: Diagnosis not present

## 2016-01-25 DIAGNOSIS — J9601 Acute respiratory failure with hypoxia: Secondary | ICD-10-CM | POA: Diagnosis not present

## 2016-01-25 DIAGNOSIS — I272 Other secondary pulmonary hypertension: Secondary | ICD-10-CM | POA: Diagnosis not present

## 2016-01-25 DIAGNOSIS — I672 Cerebral atherosclerosis: Secondary | ICD-10-CM | POA: Diagnosis present

## 2016-01-25 DIAGNOSIS — Z8673 Personal history of transient ischemic attack (TIA), and cerebral infarction without residual deficits: Secondary | ICD-10-CM | POA: Diagnosis not present

## 2016-01-25 DIAGNOSIS — I63512 Cerebral infarction due to unspecified occlusion or stenosis of left middle cerebral artery: Secondary | ICD-10-CM | POA: Diagnosis not present

## 2016-01-25 DIAGNOSIS — Q251 Coarctation of aorta: Secondary | ICD-10-CM

## 2016-01-25 DIAGNOSIS — Z96653 Presence of artificial knee joint, bilateral: Secondary | ICD-10-CM | POA: Diagnosis present

## 2016-01-25 DIAGNOSIS — M19072 Primary osteoarthritis, left ankle and foot: Secondary | ICD-10-CM | POA: Diagnosis present

## 2016-01-25 DIAGNOSIS — S199XXA Unspecified injury of neck, initial encounter: Secondary | ICD-10-CM | POA: Diagnosis not present

## 2016-01-25 DIAGNOSIS — Z803 Family history of malignant neoplasm of breast: Secondary | ICD-10-CM | POA: Diagnosis not present

## 2016-01-25 DIAGNOSIS — R4701 Aphasia: Secondary | ICD-10-CM | POA: Diagnosis present

## 2016-01-25 DIAGNOSIS — I159 Secondary hypertension, unspecified: Secondary | ICD-10-CM | POA: Insufficient documentation

## 2016-01-25 DIAGNOSIS — M4854XA Collapsed vertebra, not elsewhere classified, thoracic region, initial encounter for fracture: Secondary | ICD-10-CM | POA: Diagnosis not present

## 2016-01-25 DIAGNOSIS — I6789 Other cerebrovascular disease: Secondary | ICD-10-CM | POA: Diagnosis not present

## 2016-01-25 DIAGNOSIS — I2510 Atherosclerotic heart disease of native coronary artery without angina pectoris: Secondary | ICD-10-CM | POA: Diagnosis present

## 2016-01-25 DIAGNOSIS — Z800 Family history of malignant neoplasm of digestive organs: Secondary | ICD-10-CM

## 2016-01-25 DIAGNOSIS — R0781 Pleurodynia: Secondary | ICD-10-CM | POA: Diagnosis not present

## 2016-01-25 DIAGNOSIS — I6521 Occlusion and stenosis of right carotid artery: Secondary | ICD-10-CM | POA: Diagnosis not present

## 2016-01-25 DIAGNOSIS — I69398 Other sequelae of cerebral infarction: Secondary | ICD-10-CM

## 2016-01-25 DIAGNOSIS — I6602 Occlusion and stenosis of left middle cerebral artery: Secondary | ICD-10-CM | POA: Diagnosis not present

## 2016-01-25 DIAGNOSIS — S91109A Unspecified open wound of unspecified toe(s) without damage to nail, initial encounter: Secondary | ICD-10-CM | POA: Diagnosis not present

## 2016-01-25 DIAGNOSIS — Z9181 History of falling: Secondary | ICD-10-CM | POA: Diagnosis not present

## 2016-01-25 DIAGNOSIS — I63412 Cerebral infarction due to embolism of left middle cerebral artery: Secondary | ICD-10-CM | POA: Diagnosis not present

## 2016-01-25 DIAGNOSIS — H532 Diplopia: Secondary | ICD-10-CM

## 2016-01-25 HISTORY — PX: RADIOLOGY WITH ANESTHESIA: SHX6223

## 2016-01-25 HISTORY — DX: Cerebral infarction, unspecified: I63.9

## 2016-01-25 LAB — URINALYSIS, ROUTINE W REFLEX MICROSCOPIC
Bilirubin Urine: NEGATIVE
GLUCOSE, UA: NEGATIVE mg/dL
Hgb urine dipstick: NEGATIVE
KETONES UR: NEGATIVE mg/dL
LEUKOCYTES UA: NEGATIVE
NITRITE: NEGATIVE
PH: 7 (ref 5.0–8.0)
PROTEIN: NEGATIVE mg/dL
Specific Gravity, Urine: 1.035 — ABNORMAL HIGH (ref 1.005–1.030)

## 2016-01-25 LAB — CBC
HEMATOCRIT: 33.3 % — AB (ref 36.0–46.0)
Hemoglobin: 10.8 g/dL — ABNORMAL LOW (ref 12.0–15.0)
MCH: 29 pg (ref 26.0–34.0)
MCHC: 32.4 g/dL (ref 30.0–36.0)
MCV: 89.5 fL (ref 78.0–100.0)
Platelets: 372 10*3/uL (ref 150–400)
RBC: 3.72 MIL/uL — AB (ref 3.87–5.11)
RDW: 14.2 % (ref 11.5–15.5)
WBC: 8.6 10*3/uL (ref 4.0–10.5)

## 2016-01-25 LAB — COMPREHENSIVE METABOLIC PANEL
ALK PHOS: 81 U/L (ref 38–126)
ALT: 12 U/L — ABNORMAL LOW (ref 14–54)
ANION GAP: 9 (ref 5–15)
AST: 18 U/L (ref 15–41)
Albumin: 3.3 g/dL — ABNORMAL LOW (ref 3.5–5.0)
BILIRUBIN TOTAL: 0.4 mg/dL (ref 0.3–1.2)
BUN: 10 mg/dL (ref 6–20)
CALCIUM: 9.1 mg/dL (ref 8.9–10.3)
CO2: 26 mmol/L (ref 22–32)
Chloride: 103 mmol/L (ref 101–111)
Creatinine, Ser: 1.03 mg/dL — ABNORMAL HIGH (ref 0.44–1.00)
GFR, EST AFRICAN AMERICAN: 55 mL/min — AB (ref >60)
GFR, EST NON AFRICAN AMERICAN: 48 mL/min — AB (ref >60)
GLUCOSE: 120 mg/dL — AB (ref 65–99)
POTASSIUM: 3.9 mmol/L (ref 3.5–5.1)
Sodium: 138 mmol/L (ref 135–145)
TOTAL PROTEIN: 6.4 g/dL — AB (ref 6.5–8.1)

## 2016-01-25 LAB — I-STAT CHEM 8, ED
BUN: 11 mg/dL (ref 6–20)
CALCIUM ION: 1.15 mmol/L (ref 1.13–1.30)
CHLORIDE: 102 mmol/L (ref 101–111)
Creatinine, Ser: 1 mg/dL (ref 0.44–1.00)
GLUCOSE: 115 mg/dL — AB (ref 65–99)
HCT: 35 % — ABNORMAL LOW (ref 36.0–46.0)
Hemoglobin: 11.9 g/dL — ABNORMAL LOW (ref 12.0–15.0)
Potassium: 3.8 mmol/L (ref 3.5–5.1)
SODIUM: 139 mmol/L (ref 135–145)
TCO2: 25 mmol/L (ref 0–100)

## 2016-01-25 LAB — PROTIME-INR
INR: 1.46 (ref 0.00–1.49)
PROTHROMBIN TIME: 17.8 s — AB (ref 11.6–15.2)

## 2016-01-25 LAB — MRSA PCR SCREENING: MRSA BY PCR: NEGATIVE

## 2016-01-25 LAB — I-STAT TROPONIN, ED: TROPONIN I, POC: 0.01 ng/mL (ref 0.00–0.08)

## 2016-01-25 LAB — BLOOD GAS, ARTERIAL
Acid-base deficit: 0.7 mmol/L (ref 0.0–2.0)
Bicarbonate: 22.9 mEq/L (ref 20.0–24.0)
DRAWN BY: 280981
FIO2: 1
MECHVT: 380 mL
PEEP/CPAP: 5 cmH2O
PO2 ART: 418 mmHg — AB (ref 80.0–100.0)
Patient temperature: 98.6
RATE: 18 resp/min
TCO2: 24 mmol/L (ref 0–100)
pCO2 arterial: 34.4 mmHg — ABNORMAL LOW (ref 35.0–45.0)
pH, Arterial: 7.439 (ref 7.350–7.450)

## 2016-01-25 LAB — TRIGLYCERIDES: TRIGLYCERIDES: 91 mg/dL (ref <150)

## 2016-01-25 LAB — DIFFERENTIAL
Basophils Absolute: 0 10*3/uL (ref 0.0–0.1)
Basophils Relative: 0 %
EOS ABS: 0.2 10*3/uL (ref 0.0–0.7)
EOS PCT: 2 %
LYMPHS ABS: 2.2 10*3/uL (ref 0.7–4.0)
LYMPHS PCT: 26 %
MONO ABS: 1 10*3/uL (ref 0.1–1.0)
MONOS PCT: 11 %
NEUTROS PCT: 61 %
Neutro Abs: 5.2 10*3/uL (ref 1.7–7.7)

## 2016-01-25 LAB — RAPID URINE DRUG SCREEN, HOSP PERFORMED
Amphetamines: NOT DETECTED
Barbiturates: NOT DETECTED
Benzodiazepines: NOT DETECTED
COCAINE: NOT DETECTED
OPIATES: NOT DETECTED
TETRAHYDROCANNABINOL: NOT DETECTED

## 2016-01-25 LAB — APTT: aPTT: 32 seconds (ref 24–37)

## 2016-01-25 LAB — ETHANOL

## 2016-01-25 SURGERY — RADIOLOGY WITH ANESTHESIA
Anesthesia: General

## 2016-01-25 MED ORDER — EPTIFIBATIDE 20 MG/10ML IV SOLN
INTRAVENOUS | Status: AC
  Filled 2016-01-25: qty 10

## 2016-01-25 MED ORDER — SODIUM CHLORIDE 0.9 % IV SOLN: INTRAVENOUS | Status: DC

## 2016-01-25 MED ORDER — ACETAMINOPHEN 325 MG PO TABS: 650.0000 mg | ORAL_TABLET | ORAL | Status: DC | PRN

## 2016-01-25 MED ORDER — ACETAMINOPHEN 650 MG RE SUPP: 650.0000 mg | RECTAL | Status: DC | PRN

## 2016-01-25 MED ORDER — DIPHENHYDRAMINE HCL 50 MG/ML IJ SOLN
50.0000 mg | INTRAMUSCULAR | Status: AC
Start: 2016-01-25 — End: 2016-01-26
  Filled 2016-01-25: qty 1

## 2016-01-25 MED ORDER — DEXTROSE 5 % IV SOLN
10.0000 mg | INTRAVENOUS | Status: DC | PRN
  Administered 2016-01-25: 20 ug/min via INTRAVENOUS

## 2016-01-25 MED ORDER — ONDANSETRON HCL 4 MG/2ML IJ SOLN: 4.0000 mg | Freq: Four times a day (QID) | INTRAMUSCULAR | Status: DC | PRN

## 2016-01-25 MED ORDER — ANTISEPTIC ORAL RINSE SOLUTION (CORINZ)
7.0000 mL | Freq: Four times a day (QID) | OROMUCOSAL | Status: DC
  Administered 2016-01-25 – 2016-01-26 (×4): 7 mL via OROMUCOSAL

## 2016-01-25 MED ORDER — ANASTROZOLE 1 MG PO TABS
1.0000 mg | ORAL_TABLET | Freq: Every day | ORAL | Status: DC
  Administered 2016-01-27 – 2016-01-28 (×2): 1 mg
  Filled 2016-01-25 (×4): qty 1

## 2016-01-25 MED ORDER — PROPOFOL 10 MG/ML IV BOLUS
INTRAVENOUS | Status: DC | PRN
Start: 2016-01-25 — End: 2016-01-25
  Administered 2016-01-25: 80 mg via INTRAVENOUS

## 2016-01-25 MED ORDER — NITROGLYCERIN 1 MG/10 ML FOR IR/CATH LAB
INTRA_ARTERIAL | Status: AC | PRN
  Administered 2016-01-25 (×3): 25 ug via INTRA_ARTERIAL

## 2016-01-25 MED ORDER — SENNOSIDES-DOCUSATE SODIUM 8.6-50 MG PO TABS: 1.0000 | ORAL_TABLET | Freq: Every evening | ORAL | Status: DC | PRN

## 2016-01-25 MED ORDER — ACETAMINOPHEN 500 MG PO TABS: 1000.0000 mg | ORAL_TABLET | Freq: Four times a day (QID) | ORAL | Status: DC | PRN

## 2016-01-25 MED ORDER — IOPAMIDOL (ISOVUE-370) INJECTION 76%
INTRAVENOUS | Status: AC
  Administered 2016-01-25: 40 mL via INTRAVENOUS
  Filled 2016-01-25: qty 100

## 2016-01-25 MED ORDER — IOPAMIDOL (ISOVUE-300) INJECTION 61%
INTRAVENOUS | Status: AC
  Administered 2016-01-25: 70 mL
  Filled 2016-01-25: qty 150

## 2016-01-25 MED ORDER — STROKE: EARLY STAGES OF RECOVERY BOOK
Freq: Once | Status: AC
  Administered 2016-01-25: 10:00:00
  Filled 2016-01-25: qty 1

## 2016-01-25 MED ORDER — LABETALOL HCL 5 MG/ML IV SOLN
10.0000 mg | INTRAVENOUS | Status: DC | PRN
  Administered 2016-01-26 (×2): 10 mg via INTRAVENOUS
  Filled 2016-01-25 (×2): qty 4

## 2016-01-25 MED ORDER — FENTANYL CITRATE (PF) 100 MCG/2ML IJ SOLN: 50.0000 ug | INTRAMUSCULAR | Status: DC | PRN

## 2016-01-25 MED ORDER — PHENYLEPHRINE HCL 10 MG/ML IJ SOLN
INTRAMUSCULAR | Status: DC | PRN
  Administered 2016-01-25: 80 ug via INTRAVENOUS

## 2016-01-25 MED ORDER — FENTANYL CITRATE (PF) 100 MCG/2ML IJ SOLN
50.0000 ug | INTRAMUSCULAR | Status: DC | PRN
Start: 2016-01-25 — End: 2016-01-27

## 2016-01-25 MED ORDER — PANTOPRAZOLE SODIUM 40 MG IV SOLR
40.0000 mg | Freq: Every day | INTRAVENOUS | Status: DC
  Administered 2016-01-25 – 2016-01-26 (×2): 40 mg via INTRAVENOUS
  Filled 2016-01-25 (×2): qty 40

## 2016-01-25 MED ORDER — CHLORHEXIDINE GLUCONATE 0.12% ORAL RINSE (MEDLINE KIT)
15.0000 mL | Freq: Two times a day (BID) | OROMUCOSAL | Status: DC
  Administered 2016-01-25 – 2016-01-26 (×4): 15 mL via OROMUCOSAL

## 2016-01-25 MED ORDER — SUCCINYLCHOLINE CHLORIDE 20 MG/ML IJ SOLN
INTRAMUSCULAR | Status: DC | PRN
  Administered 2016-01-25: 80 mg via INTRAVENOUS

## 2016-01-25 MED ORDER — SODIUM CHLORIDE 0.9 % IV SOLN
INTRAVENOUS | Status: DC
  Administered 2016-01-25 – 2016-01-26 (×3): via INTRAVENOUS

## 2016-01-25 MED ORDER — PROPOFOL 500 MG/50ML IV EMUL
INTRAVENOUS | Status: DC | PRN
  Administered 2016-01-25: 25 ug/kg/min via INTRAVENOUS

## 2016-01-25 MED ORDER — METHYLPREDNISOLONE SODIUM SUCC 125 MG IJ SOLR
INTRAMUSCULAR | Status: DC | PRN
  Administered 2016-01-25: 125 mg via INTRAVENOUS

## 2016-01-25 MED ORDER — SODIUM CHLORIDE 0.9 % IV SOLN
INTRAVENOUS | Status: DC | PRN
  Administered 2016-01-25 (×2): via INTRAVENOUS

## 2016-01-25 MED ORDER — ACETAMINOPHEN 650 MG RE SUPP: 650.0000 mg | Freq: Four times a day (QID) | RECTAL | Status: DC | PRN

## 2016-01-25 MED ORDER — IOPAMIDOL (ISOVUE-300) INJECTION 61%
INTRAVENOUS | Status: AC
  Administered 2016-01-25: 56 mL
  Filled 2016-01-25: qty 150

## 2016-01-25 MED ORDER — METHYLPREDNISOLONE SODIUM SUCC 125 MG IJ SOLR
125.0000 mg | INTRAMUSCULAR | Status: AC
  Filled 2016-01-25: qty 2

## 2016-01-25 MED ORDER — DIPHENHYDRAMINE HCL 50 MG/ML IJ SOLN
INTRAMUSCULAR | Status: DC | PRN
  Administered 2016-01-25: 50 mg via INTRAVENOUS

## 2016-01-25 MED ORDER — LIDOCAINE HCL 1 % IJ SOLN
INTRAMUSCULAR | Status: AC
  Filled 2016-01-25: qty 20

## 2016-01-25 MED ORDER — CEFAZOLIN SODIUM-DEXTROSE 2-4 GM/100ML-% IV SOLN
INTRAVENOUS | Status: AC
  Administered 2016-01-25: 2 g via INTRAVENOUS
  Filled 2016-01-25: qty 100

## 2016-01-25 MED ORDER — EPTIFIBATIDE 20 MG/10ML IV SOLN
INTRAVENOUS | Status: AC | PRN
  Administered 2016-01-25 (×2): 2 mg

## 2016-01-25 MED ORDER — NICARDIPINE HCL IN NACL 20-0.86 MG/200ML-% IV SOLN: 5.0000 mg/h | INTRAVENOUS | Status: DC

## 2016-01-25 MED ORDER — PROPOFOL 1000 MG/100ML IV EMUL
5.0000 ug/kg/min | INTRAVENOUS | Status: DC
  Administered 2016-01-25 – 2016-01-26 (×4): 50 ug/kg/min via INTRAVENOUS
  Filled 2016-01-25 (×5): qty 100

## 2016-01-25 MED ORDER — NITROGLYCERIN 1 MG/10 ML FOR IR/CATH LAB
INTRA_ARTERIAL | Status: AC
  Filled 2016-01-25: qty 10

## 2016-01-25 MED ORDER — ROCURONIUM BROMIDE 100 MG/10ML IV SOLN
INTRAVENOUS | Status: DC | PRN
  Administered 2016-01-25 (×2): 20 mg via INTRAVENOUS
  Administered 2016-01-25: 50 mg via INTRAVENOUS

## 2016-01-25 MED ORDER — FENTANYL CITRATE (PF) 100 MCG/2ML IJ SOLN
INTRAMUSCULAR | Status: DC | PRN
  Administered 2016-01-25 (×5): 50 ug via INTRAVENOUS

## 2016-01-25 NOTE — ED Notes (Signed)
Pt. Coming from home via [facility] for [facility] after fall today. Pt. Last seen normal by husband at 0700. Pt. Went to bathroom and pt husband heard her fall. EMS arrived and noted pt. To be nonverbal and with right-sided paralysis. Pt. Hx of stroke 5 years ago with  No residual deficits per husband. Upon arrival pt. Still nonverbal with right sided paralysis. Pt. Sent for CT head, CT angio, and CT perfusion. RN noted pt. Started to make unrecognizable sounds and starting to move that right side. Pt. Transported to Interventional radiology without ever being in her emergency department room.

## 2016-01-25 NOTE — Anesthesia Preprocedure Evaluation (Addendum)
Anesthesia Evaluation  Patient identified by MRN, date of birth, ID band Patient confused  Preop documentation limited or incomplete due to emergent nature of procedure.  History of Anesthesia Complications Negative for: history of anesthetic complications  Airway Mallampati: I  TM Distance: >3 FB Neck ROM: full    Dental no notable dental hx. (+) Teeth Intact   Pulmonary former smoker,    Pulmonary exam normal breath sounds clear to auscultation       Cardiovascular hypertension, Pt. on medications + CAD (nonobstructive)  + dysrhythmias Atrial Fibrillation  Rhythm:Irregular Rate:Tachycardia  2016 Echo with EF 60%, mild MR, mild TR and no sign of elevated pulmonary pressures  CAD is non-obstructive, medically managed  She does have afib which she is chronically anticoagulated    Neuro/Psych CVA    GI/Hepatic negative GI ROS, Neg liver ROS,   Endo/Other  negative endocrine ROS  Renal/GU negative Renal ROS     Musculoskeletal   Abdominal   Peds  Hematology negative hematology ROS (+)   Anesthesia Other Findings Patient comes in with neurological deficits and inability to answer questions, she is a code stroke   Reproductive/Obstetrics negative OB ROS                          Anesthesia Physical Anesthesia Plan  ASA: IV and emergent  Anesthesia Plan: General   Post-op Pain Management:    Induction: Intravenous and Rapid sequence  Airway Management Planned: Oral ETT  Additional Equipment:   Intra-op Plan:   Post-operative Plan: Post-operative intubation/ventilation  Informed Consent: I have reviewed the patients History and Physical, chart, labs and discussed the procedure including the risks, benefits and alternatives for the proposed anesthesia with the patient or authorized representative who has indicated his/her understanding and acceptance.   Dental Advisory Given  Plan  Discussed with: Anesthesiologist, CRNA and Surgeon  Anesthesia Plan Comments: (Code stroke, emergent induction of General anesthesia, goal BP is to allow permissive hypertension, goal systolics in room are [id number], after discussion with Dr. Estanislado Pandy we will forgo A line as we are getting reliable blood pressures and given normal Echo recently. If Non-invasive BPs become unreliable will place A line. Likely will remain intubated given poor preinduction mental status)      Anesthesia Quick Evaluation

## 2016-01-25 NOTE — Code Documentation (Signed)
82 year old female presents from home via [facility] with stroke sx.  Husband reports with waking this AM all was normal. Patient was awake and talking without deficits. At 0700 she went to take a shower - husband brought her a glass of orange juice to the bathroom.  After a few minutes he heard a noise from the bathroom - went in to find her on the floor -unable to speak and unable to move her right side.  The glass was broken and she had several cuts on her feet from the glass.  LSW 0700.  EMS was called - they reported her with complete right hemiplegia, no speech, no following of commands.  Initial NIHHS 23 -Had GIB one month ago.  IR contacted. After CT scan, CTA and CTP she began to move her right side - eyes more midline, still no speech and not following commands but  will focus.  Blood sugar 120.   Dr. Faythe Dingwall and Dr. Estanislado Pandy speaking with husband.  Patient transported to IR suite without incident - handoff to IR staff along with Junie Panning RN from ED.  Husband and daughter updated.

## 2016-01-25 NOTE — Progress Notes (Signed)
Neuro IR Rt 9 french femoral sheath removed at 1440.  Manual pressure applied to achieve hemostasis at 1510.  Kim RN reviewed site.  Pressure dressing and sandbag applied. No acute complication.  [place] RT-R Harrie Foreman RN

## 2016-01-25 NOTE — Procedures (Signed)
S/PLAN: bilateral common carotid arteriograms followed by complete revascularization of Occluded Lt MCA  With x 1 pass with Solitaire 4 mmx 40 mm Retrieval device and 4 mg of superselective IA integrelin with TICI 3 revascullarization

## 2016-01-25 NOTE — Consult Note (Signed)
PULMONARY / CRITICAL CARE MEDICINE   Name: Adrienne [name] MRN: [id number] DOB: Jan 24, 1928    ADMISSION DATE:  01/25/2016 CONSULTATION DATE:  01/25/16  REFERRING MD:  Estanislado Pandy  CHIEF COMPLAINT:  VDRF following CVA  HISTORY OF PRESENT ILLNESS:  Pt is encephelopathic; therefore, this HPI is obtained from chart review. Adrienne [name] is a 82 y.o. female with PMH as outlined below. Husband reports that she was in her [place] on AM of 05/15.  He went to take her a glass of orange juice to the bathroom and after a few minutes, he heard a noise.  When he went to bathroom, he found pt lying on the floor, unable to speak or move her right side.  EMS was dispatched and on their arrival, pt had complete right hemiplegia.  She was brought to [facility] ED as code stroke.  She was taken to IR where she was intubated and had cerebral angiogram with complete revascularization of occluded left MCA.  She returned to the ICU on the ventilator and PCCM was called for vent management.  Of note, she has hx A.fib and was on warfarin previously.  This was stopped in April 2017 after she had admission for diverticular GI bleed precipitated by anticoagulation.  Anticoagulation was stopped and pt had 1u PRBC transfusion that admission.  She was discharged with instructions to follow up with Dr. Paulita Fujita of GI as well as PCP to determine timing of resuming anticoagulation.  PAST MEDICAL HISTORY :  She  has a past medical history of Afib ([place]); Elevated uric acid in blood; Arthritis of ankle; Pulmonary HTN ([place]); Long-term (current) use of anticoagulants; Moderate mitral regurgitation; CAD (coronary artery disease); Edema; Cancer ([place]) (2012); Hypertension; Urinary incontinence; TIA (transient ischemic attack) (09/2011); Diverticulosis; and Compression fracture of T12 vertebra ([place]).  PAST SURGICAL HISTORY: She  has past surgical history that includes Cardiac catheterization (2013); Replacement total knee bilateral; Gallbladder  surgery; Appendectomy; Mastectomy (Right); Cataract extraction, bilateral; Breast surgery (2012); Tonsillectomy and adenoidectomy; Dilation and curettage of uterus; and Esophagogastroduodenoscopy (Left, 01/08/2016).  Allergies  Allergen Reactions  . Amiodarone Hcl [Amiodarone]     AFIB  . Compazine [Prochlorperazine]     HYPER  . Other Other (See Comments)    "Kidney dye"--patient states this gave her severe shakes/chills  . Thorazine [Chlorpromazine]     HYPER  . Valium [Diazepam]     HYPER   . Zometa [Zoledronic Acid]     PROBLEMS WITH EYES    No current facility-administered medications on file prior to encounter.   Current Outpatient Prescriptions on File Prior to Encounter  Medication Sig  . acetaminophen (TYLENOL) 500 MG tablet Take 1,000 mg by mouth 2 (two) times daily as needed for mild pain, moderate pain, fever or headache.  . anastrozole (ARIMIDEX) 1 MG tablet Take 1 mg by mouth daily.  Marland Kitchen CALCIUM CITRATE PO Take 630 mg by mouth 2 (two) times daily.  . Cholecalciferol (VITAMIN D PO) Take 1,900 mg by mouth daily.   Marland Kitchen diltiazem (CARDIZEM) 60 MG tablet Take 1 tablet (60 mg total) by mouth 3 (three) times daily.  Marland Kitchen HYDROcodone-acetaminophen (NORCO/VICODIN) 5-325 MG tablet Take 1 tablet by mouth every 8 (eight) hours as needed for moderate pain.  . metoprolol (LOPRESSOR) 100 MG tablet Take 1 tablet (100 mg total) by mouth 2 (two) times daily.  Marland Kitchen omeprazole (PRILOSEC) 40 MG capsule Take 40 mg by mouth daily.   Marland Kitchen trimethoprim (TRIMPEX) 100 MG tablet Take 1 tablet by mouth daily.   Marland Kitchen  vitamin C (ASCORBIC ACID) 500 MG tablet Take 500 mg by mouth 2 (two) times daily.    FAMILY HISTORY:  Her indicated that her mother is deceased. She indicated that her father is deceased. She indicated that her sister is alive. She indicated that her brother is deceased.   SOCIAL HISTORY: She  reports that she has quit smoking. Her smoking use included Cigarettes. She does not have any smokeless  tobacco history on file. She reports that she does not drink alcohol or use illicit drugs.  REVIEW OF SYSTEMS:   Unable to obtain as pt is encephalopathic.  SUBJECTIVE:  On vent.  VITAL SIGNS: BP 151/89 mmHg  Wt 154 lb 1.6 oz (69.9 kg)  SpO2 95%  LMP 09/12/1973 (Approximate)  HEMODYNAMICS:    VENTILATOR SETTINGS:    INTAKE / OUTPUT:     PHYSICAL EXAMINATION: General: Elderly female, resting in bed, in NAD. Neuro: Sedated, non-responsive. HEENT: /AT. PERRL, sclerae anicteric. Cardiovascular: RRR, no M/R/G.  Lungs: Respirations even and unlabored.  CTA bilaterally, No W/R/R. Abdomen: BS x 4, soft, NT/ND.  Musculoskeletal: No gross deformities, no edema.  Skin: Intact, warm, no rashes.    LABS:  BMET  Recent Labs Lab 01/25/16 0807 01/25/16 0814  NA 138 139  K 3.9 3.8  CL 103 102  CO2 26  --   BUN 10 11  CREATININE 1.03* 1.00  GLUCOSE 120* 115*    Electrolytes  Recent Labs Lab 01/25/16 0807  CALCIUM 9.1    CBC  Recent Labs Lab 01/25/16 0807 01/25/16 0814  WBC 8.6  --   HGB 10.8* 11.9*  HCT 33.3* 35.0*  PLT 372  --     Coag's  Recent Labs Lab 01/25/16 0807  APTT 32  INR 1.46    Sepsis Markers No results for input(s): LATICACIDVEN, PROCALCITON, O2SATVEN in the last 168 hours.  ABG No results for input(s): PHART, PCO2ART, PO2ART in the last 168 hours.  Liver Enzymes  Recent Labs Lab 01/25/16 0807  AST 18  ALT 12*  ALKPHOS 81  BILITOT 0.4  ALBUMIN 3.3*    Cardiac Enzymes No results for input(s): TROPONINI, PROBNP in the last 168 hours.  Glucose No results for input(s): GLUCAP in the last 168 hours.  Imaging Ct Angio Head W/cm &/or Wo Cm  01/25/2016  CLINICAL DATA:  82 year old female code stroke, last seen normal a 0630 hours. Left MCA large vessel infarcts suspected clinically. Initial encounter. EXAM: CT ANGIOGRAPHY HEAD AND NECK CT BRAIN PERFUSION TECHNIQUE: Multidetector CT imaging of the head and neck was  performed using the standard protocol during bolus administration of intravenous contrast. Multiplanar CT image reconstructions and MIPs were obtained to evaluate the vascular anatomy. Carotid stenosis measurements (when applicable) are obtained utilizing NASCET criteria, using the distal internal carotid diameter as the denominator. CONTRAST:  100 mL Isovue 370. COMPARISON:  Noncontrast head CT 0808 hours today. FINDINGS: CT BRAIN PERFUSION Source images reveal evidence of a posterior division left MCA M2 occlusion. Perfusion maps demonstrate decreased cerebral blood flow throughout the posterior and middle left MCA territory with associated abnormal mean transit time and time to peak (more abnormal in the posterior than the middle left MCA divisions). At the same time the cerebral blood volume remains symmetric to the right hemisphere. CTA NECK Skeleton: Cervical spine and left TMJ degeneration. Cervical spine findings today reported separately. No acute osseous abnormality identified. Paranasal sinuses and mastoids are clear. Other neck: Apical pulmonary septal thickening. Mild mosaic ground-glass opacity. No  superior mediastinal lymphadenopathy. Small bilateral benign-appearing thyroid nodules. Larynx, pharynx, parapharyngeal spaces, retropharyngeal space, sublingual space, submandibular glands (right submandibular gland ptosis), and bilateral parotid glands are within normal limits. No cervical lymphadenopathy. Aortic arch: 3 vessel arch configuration. Mild to moderate calcified arch atherosclerosis. No great vessel origin stenosis. Right carotid system: Tortuous proximal right CCA. Intermittent mild right CCA calcified plaque without stenosis. Confluent calcified plaque at the right ICA origin and bulb without stenosis. Negative cervical right ICA otherwise. Left carotid system: Minimal calcified plaque at the left CCA origin without stenosis. Moderately tortuous left CCA. Confluent calcified plaque at the  left carotid bifurcation, left ICA origin, and bulb. Subsequent stenosis of 60 % with respect to the distal vessel. Distal to the bulb the cervical left ICA is tortuous but otherwise negative. Vertebral arteries:No proximal subclavian artery stenosis despite soft and calcified plaque worse on the left. No definite vertebral artery stenosis. Tortuous left V1 segment. Fairly codominant vertebral arteries are normal to the skullbase. CTA HEAD Posterior circulation: Mildly dominant distal right vertebral artery. The left is diminutive beyond the PICA origin. Right PICA origin also is normal. No basilar artery stenosis. Normal SCA and left PCA origins. Fetal type right PCA origin. Left PCA branches are within normal limits. The inferior right PCA P3 division is diminutive or stenotic. The left posterior communicating artery is diminutive or absent. Anterior circulation: Patent left ICA siphon with moderate calcified plaque. Up to moderate supraclinoid left ICA stenosis related to calcified plaque (series 508, image 244). The left ICA terminus and left MCA origin remain patent. The left MCA M1 segment is patent. There is thrombus at the left MCA bifurcation and occlusion of the left MCA posterior division. There is also occlusion of the middle left MCA division (series 509, image 112). There is occlusion at the origin of the anterior division (series 509, image 111) with preserved distal flow. Patent right ICA siphon with moderate calcified atherosclerosis. Mild to moderate supra clinoid right ICA stenosis. Normal ophthalmic and right posterior communicating artery origins. Normal right ICA terminus. Normal bilateral ACA and right MCA origins. Anterior communicating artery and proximal ACA branches are within normal limits. There is mild to moderate distal ACA irregularity. Right MCA M1 segment, bifurcation, and proximal right MCA branches are within normal limits. There is mild to moderate distal right MCA branch  irregularity. Venous sinuses: Patent. Anatomic variants: Dominant distal right vertebral artery. Fetal type right PCA origin. IMPRESSION: 1. Positive for Emergent Large Vessel Occlusion at The left MCA bifurcation. Abnormal left MCA perfusion with noncontrast head CT and CT Brain Perfusion parameters favorable for clot retrieval. This was discussed in person with Dr. Audria Nine at 0845 hours. The patient is undergoing intra arterial Neurointervention at this time. 2. Superimposed atherosclerotic stenosis at the left ICA origin (60-65%) and left supraclinoid ICA siphon (moderate) related to calcified plaque. 3. Superimposed mild to moderate second and third order branch intracranial atherosclerosis including in the bilateral ACA, right MCA, and left PCA branches. 4. Right carotid bifurcation calcified plaque without stenosis. Electronically Signed   By: Genevie Ann M.D.   On: 01/25/2016 10:04   Ct Head Wo Contrast  01/25/2016  ADDENDUM REPORT: 01/25/2016 09:01 ADDENDUM: Left hemisphere ASPECTS score = 9; small area of cortical hypodensity in the left M2 as seen on series 21, image 14. I gave Dr. Silverio Decamp a verbal preliminary ASPECTS score of "10" at 0845 hours today. [place] Stroke Program Early CT Score Normal score = 10 Electronically Signed  By: Genevie Ann M.D.   On: 01/25/2016 09:01  01/25/2016  CLINICAL DATA:  Right-sided weakness and neglect EXAM: CT HEAD WITHOUT CONTRAST TECHNIQUE: Contiguous axial images were obtained from the base of the skull through the vertex without intravenous contrast. COMPARISON:  None. FINDINGS: There is mild diffuse atrophy. There is no intracranial mass, hemorrhage, extra-axial fluid collection, or midline shift. There is evidence of a prior focal infarct at the level of the gray- white junction of the anterior right parietal lobe. There is patchy small vessel disease throughout the centra semiovale bilaterally. There is evidence of a prior infarct in the lateral left lentiform  nucleus. No acute appearing infarct is evident. Middle cerebral artery show symmetric attenuation bilaterally. The bony calvarium appears intact. The mastoid air cells are clear. No intraorbital lesions are evident. IMPRESSION: Atrophy with small vessel disease in the periventricular white matter. Prior infarcts as noted above. No acute appearing infarct is evident. No hemorrhage or mass effect. Critical Value/emergent results were called by telephone at the time of interpretation on 01/25/2016 at 8:25 am to Dr. Silverio Decamp, neurology, who verbally acknowledged these results. Electronically Signed: By: Lowella Grip III M.D. On: 01/25/2016 08:25   Ct Angio Neck W/cm &/or Wo/cm  01/25/2016  CLINICAL DATA:  82 year old female code stroke, last seen normal a 0630 hours. Left MCA large vessel infarcts suspected clinically. Initial encounter. EXAM: CT ANGIOGRAPHY HEAD AND NECK CT BRAIN PERFUSION TECHNIQUE: Multidetector CT imaging of the head and neck was performed using the standard protocol during bolus administration of intravenous contrast. Multiplanar CT image reconstructions and MIPs were obtained to evaluate the vascular anatomy. Carotid stenosis measurements (when applicable) are obtained utilizing NASCET criteria, using the distal internal carotid diameter as the denominator. CONTRAST:  100 mL Isovue 370. COMPARISON:  Noncontrast head CT 0808 hours today. FINDINGS: CT BRAIN PERFUSION Source images reveal evidence of a posterior division left MCA M2 occlusion. Perfusion maps demonstrate decreased cerebral blood flow throughout the posterior and middle left MCA territory with associated abnormal mean transit time and time to peak (more abnormal in the posterior than the middle left MCA divisions). At the same time the cerebral blood volume remains symmetric to the right hemisphere. CTA NECK Skeleton: Cervical spine and left TMJ degeneration. Cervical spine findings today reported separately. No acute osseous  abnormality identified. Paranasal sinuses and mastoids are clear. Other neck: Apical pulmonary septal thickening. Mild mosaic ground-glass opacity. No superior mediastinal lymphadenopathy. Small bilateral benign-appearing thyroid nodules. Larynx, pharynx, parapharyngeal spaces, retropharyngeal space, sublingual space, submandibular glands (right submandibular gland ptosis), and bilateral parotid glands are within normal limits. No cervical lymphadenopathy. Aortic arch: 3 vessel arch configuration. Mild to moderate calcified arch atherosclerosis. No great vessel origin stenosis. Right carotid system: Tortuous proximal right CCA. Intermittent mild right CCA calcified plaque without stenosis. Confluent calcified plaque at the right ICA origin and bulb without stenosis. Negative cervical right ICA otherwise. Left carotid system: Minimal calcified plaque at the left CCA origin without stenosis. Moderately tortuous left CCA. Confluent calcified plaque at the left carotid bifurcation, left ICA origin, and bulb. Subsequent stenosis of 60 % with respect to the distal vessel. Distal to the bulb the cervical left ICA is tortuous but otherwise negative. Vertebral arteries:No proximal subclavian artery stenosis despite soft and calcified plaque worse on the left. No definite vertebral artery stenosis. Tortuous left V1 segment. Fairly codominant vertebral arteries are normal to the skullbase. CTA HEAD Posterior circulation: Mildly dominant distal right vertebral artery. The left is diminutive  beyond the PICA origin. Right PICA origin also is normal. No basilar artery stenosis. Normal SCA and left PCA origins. Fetal type right PCA origin. Left PCA branches are within normal limits. The inferior right PCA P3 division is diminutive or stenotic. The left posterior communicating artery is diminutive or absent. Anterior circulation: Patent left ICA siphon with moderate calcified plaque. Up to moderate supraclinoid left ICA stenosis  related to calcified plaque (series 508, image 244). The left ICA terminus and left MCA origin remain patent. The left MCA M1 segment is patent. There is thrombus at the left MCA bifurcation and occlusion of the left MCA posterior division. There is also occlusion of the middle left MCA division (series 509, image 112). There is occlusion at the origin of the anterior division (series 509, image 111) with preserved distal flow. Patent right ICA siphon with moderate calcified atherosclerosis. Mild to moderate supra clinoid right ICA stenosis. Normal ophthalmic and right posterior communicating artery origins. Normal right ICA terminus. Normal bilateral ACA and right MCA origins. Anterior communicating artery and proximal ACA branches are within normal limits. There is mild to moderate distal ACA irregularity. Right MCA M1 segment, bifurcation, and proximal right MCA branches are within normal limits. There is mild to moderate distal right MCA branch irregularity. Venous sinuses: Patent. Anatomic variants: Dominant distal right vertebral artery. Fetal type right PCA origin. IMPRESSION: 1. Positive for Emergent Large Vessel Occlusion at The left MCA bifurcation. Abnormal left MCA perfusion with noncontrast head CT and CT Brain Perfusion parameters favorable for clot retrieval. This was discussed in person with Dr. Audria Nine at 0845 hours. The patient is undergoing intra arterial Neurointervention at this time. 2. Superimposed atherosclerotic stenosis at the left ICA origin (60-65%) and left supraclinoid ICA siphon (moderate) related to calcified plaque. 3. Superimposed mild to moderate second and third order branch intracranial atherosclerosis including in the bilateral ACA, right MCA, and left PCA branches. 4. Right carotid bifurcation calcified plaque without stenosis. Electronically Signed   By: Genevie Ann M.D.   On: 01/25/2016 10:04   Ct Cerebral Perfusion W/cm  01/25/2016  CLINICAL DATA:  82 year old female  code stroke, last seen normal a 0630 hours. Left MCA large vessel infarcts suspected clinically. Initial encounter. EXAM: CT ANGIOGRAPHY HEAD AND NECK CT BRAIN PERFUSION TECHNIQUE: Multidetector CT imaging of the head and neck was performed using the standard protocol during bolus administration of intravenous contrast. Multiplanar CT image reconstructions and MIPs were obtained to evaluate the vascular anatomy. Carotid stenosis measurements (when applicable) are obtained utilizing NASCET criteria, using the distal internal carotid diameter as the denominator. CONTRAST:  100 mL Isovue 370. COMPARISON:  Noncontrast head CT 0808 hours today. FINDINGS: CT BRAIN PERFUSION Source images reveal evidence of a posterior division left MCA M2 occlusion. Perfusion maps demonstrate decreased cerebral blood flow throughout the posterior and middle left MCA territory with associated abnormal mean transit time and time to peak (more abnormal in the posterior than the middle left MCA divisions). At the same time the cerebral blood volume remains symmetric to the right hemisphere. CTA NECK Skeleton: Cervical spine and left TMJ degeneration. Cervical spine findings today reported separately. No acute osseous abnormality identified. Paranasal sinuses and mastoids are clear. Other neck: Apical pulmonary septal thickening. Mild mosaic ground-glass opacity. No superior mediastinal lymphadenopathy. Small bilateral benign-appearing thyroid nodules. Larynx, pharynx, parapharyngeal spaces, retropharyngeal space, sublingual space, submandibular glands (right submandibular gland ptosis), and bilateral parotid glands are within normal limits. No cervical lymphadenopathy. Aortic arch: 3 vessel  arch configuration. Mild to moderate calcified arch atherosclerosis. No great vessel origin stenosis. Right carotid system: Tortuous proximal right CCA. Intermittent mild right CCA calcified plaque without stenosis. Confluent calcified plaque at the right  ICA origin and bulb without stenosis. Negative cervical right ICA otherwise. Left carotid system: Minimal calcified plaque at the left CCA origin without stenosis. Moderately tortuous left CCA. Confluent calcified plaque at the left carotid bifurcation, left ICA origin, and bulb. Subsequent stenosis of 60 % with respect to the distal vessel. Distal to the bulb the cervical left ICA is tortuous but otherwise negative. Vertebral arteries:No proximal subclavian artery stenosis despite soft and calcified plaque worse on the left. No definite vertebral artery stenosis. Tortuous left V1 segment. Fairly codominant vertebral arteries are normal to the skullbase. CTA HEAD Posterior circulation: Mildly dominant distal right vertebral artery. The left is diminutive beyond the PICA origin. Right PICA origin also is normal. No basilar artery stenosis. Normal SCA and left PCA origins. Fetal type right PCA origin. Left PCA branches are within normal limits. The inferior right PCA P3 division is diminutive or stenotic. The left posterior communicating artery is diminutive or absent. Anterior circulation: Patent left ICA siphon with moderate calcified plaque. Up to moderate supraclinoid left ICA stenosis related to calcified plaque (series 508, image 244). The left ICA terminus and left MCA origin remain patent. The left MCA M1 segment is patent. There is thrombus at the left MCA bifurcation and occlusion of the left MCA posterior division. There is also occlusion of the middle left MCA division (series 509, image 112). There is occlusion at the origin of the anterior division (series 509, image 111) with preserved distal flow. Patent right ICA siphon with moderate calcified atherosclerosis. Mild to moderate supra clinoid right ICA stenosis. Normal ophthalmic and right posterior communicating artery origins. Normal right ICA terminus. Normal bilateral ACA and right MCA origins. Anterior communicating artery and proximal ACA branches  are within normal limits. There is mild to moderate distal ACA irregularity. Right MCA M1 segment, bifurcation, and proximal right MCA branches are within normal limits. There is mild to moderate distal right MCA branch irregularity. Venous sinuses: Patent. Anatomic variants: Dominant distal right vertebral artery. Fetal type right PCA origin. IMPRESSION: 1. Positive for Emergent Large Vessel Occlusion at The left MCA bifurcation. Abnormal left MCA perfusion with noncontrast head CT and CT Brain Perfusion parameters favorable for clot retrieval. This was discussed in person with Dr. Audria Nine at 0845 hours. The patient is undergoing intra arterial Neurointervention at this time. 2. Superimposed atherosclerotic stenosis at the left ICA origin (60-65%) and left supraclinoid ICA siphon (moderate) related to calcified plaque. 3. Superimposed mild to moderate second and third order branch intracranial atherosclerosis including in the bilateral ACA, right MCA, and left PCA branches. 4. Right carotid bifurcation calcified plaque without stenosis. Electronically Signed   By: Genevie Ann M.D.   On: 01/25/2016 10:04     STUDIES:  CT head 05/15 > no acute process. CTA head / neck 05/15 > left MCA infarct.  CULTURES: None.  ANTIBIOTICS: None.  SIGNIFICANT EVENTS: 05/15 > admitted with left CVA infarct, taken to neuro IR for revascularization.  LINES/TUBES: ETT 05/15 >  DISCUSSION: 81 y.o. F admitted 05/15 with acute left MCA infarct.  She was taken to IR where she was intubated and had complete revascularization.  She then returned to the ICU on teh vent and PCCM was called for vent management.  ASSESSMENT / PLAN:  NEUROLOGIC A:  Acute left MCA infarct - likely due to A.fib (anticoagulation stopped in April due to GI bleed); s/p IR revascularization 05/15. Hx TIA, T12 compression fx. P:   Sedation:  Propofol gtt / Fentanyl PRN. RASS goal: 0 to -1. Daily WUA. Neuro following, stroke workup per  them. Would recommend that neuro discuss timing of resuming anticoagulation with GI.  CARDIOVASCULAR A:  Hx A.fib (not on anticoagulation due to GI bleed in April 2017 - previously on warfarin), mitral regurg, CAD, HTN. P:  Monitor hemodynamics. Likely needs to resume anticoagulation given acute CVA (likely due to A.fib) - would recommend that primary team discuss timing with GI. Hold outpatient diltiazem, metoprolol.  PULMONARY A: VDRF - due to acute left MCA infarct. P:   Full vent support. Wean as able. VAP prevention measures. SBT in AM if neuro status allows. CXR in AM.  RENAL A:   No acute issues. P:   NS @ 77. BMP in AM.  GASTROINTESTINAL A:   GERD. Nutrition. P: Continue outpatient PPI. NPO.  HEMATOLOGIC / ONCOLOGIC A:   Hx breast CA - 2012; no documentation in Epic; though is on daily anastrozole. VTE Prophylaxis. P:  Continue outpatient anastrozole per tube. SCD's. CBC in AM.  INFECTIOUS A:   No indication of infection. P:   Monitor clinically.  ENDOCRINE A:   No acute issues.  P:   SSI if glucose consistently > 180.   Family updated: None at bedside.  Interdisciplinary Family Meeting v Palliative Care Meeting:  Due by: 01/31/16.  CC time: 35 minutes.   Montey Hora, [facility] Pager: [phone]  or [phone] 01/25/2016, 11:41 AM   ATTENDING NOTE / ATTESTATION NOTE :   I have discussed the case with the resident/APP  Rahul Desai.  I agree with the resident/APP's  history, physical examination, assessment, and plans.  I have edited the above note and modified it according to our agreed history, physical examination, assessment and plan.   Briefly, pt admitted with L MCA CVA, had revascularization, kept intubated pot procedure. As above. Need to determine long term [place] given afib and not on [place] on admission.   I have spent 31 minutes of critical care time with this patient  today.  Family : No family at bedside.    Monica Becton, MD 01/25/2016, 5:04 PM [facility] Pager [phone] After 3 pm or if no answer, call [phone]

## 2016-01-25 NOTE — Anesthesia Postprocedure Evaluation (Signed)
Anesthesia Post Note  Patient: Adrienne [name]  Procedure(s) Performed: Procedure(s) (LRB): RADIOLOGY WITH ANESTHESIA (N/A)  Patient location during evaluation: SICU Anesthesia Type: General Level of consciousness: sedated Pain management: pain level controlled Vital Signs Assessment: post-procedure vital signs reviewed and stable Respiratory status: patient remains intubated per anesthesia plan Cardiovascular status: stable Anesthetic complications: no    Last Vitals:  Filed Vitals:   01/25/16 1235 01/25/16 1300  BP: 144/65 120/77  Pulse: 80   Temp:    Resp: 18 18    Last Pain: There were no vitals filed for this visit.               Zenaida Deed

## 2016-01-25 NOTE — Transfer of Care (Signed)
Immediate Anesthesia Transfer of Care Note  Patient: Adrienne [name]  Procedure(s) Performed: Procedure(s): RADIOLOGY WITH ANESTHESIA (N/A)  Patient Location: PACU  Anesthesia Type:General  Level of Consciousness: Patient remains intubated per anesthesia plan  Airway & Oxygen Therapy: Patient placed on Ventilator (see vital sign flow sheet for setting)  Post-op Assessment: Report given to RN  Post vital signs: Reviewed and stable  Last Vitals:  Filed Vitals:   01/25/16 1230 01/25/16 1235  BP:  144/65  Pulse: 83 80  Temp:    Resp: 19 18    Last Pain: There were no vitals filed for this visit.       Complications: No apparent anesthesia complications

## 2016-01-25 NOTE — Anesthesia Procedure Notes (Signed)
Procedure Name: Intubation Date/Time: 01/25/2016 9:15 AM Performed by: Izora Gala Pre-anesthesia Checklist: Patient identified, Emergency Drugs available, Suction available and Patient being monitored Patient Re-evaluated:Patient Re-evaluated prior to inductionOxygen Delivery Method: Circle system utilized Preoxygenation: Pre-oxygenation with 100% oxygen Intubation Type: IV induction Ventilation: Mask ventilation without difficulty Laryngoscope Size: Miller and 3 Grade View: Grade I Tube type: Subglottic suction tube Tube size: 7.0 mm Number of attempts: 1 Airway Equipment and Method: Stylet Placement Confirmation: ETT inserted through vocal cords under direct vision,  positive ETCO2 and breath sounds checked- equal and bilateral Secured at: 22 cm Tube secured with: Tape Dental Injury: Teeth and Oropharynx as per pre-operative assessment

## 2016-01-25 NOTE — ED Provider Notes (Signed)
Patient presented via ems to bridge as code stroke.  LKN 710, fell with sudden onset of right side weakness husband heard "thump."  Patient seen and initially evaluated by Pod B doc and sent to ct with neuro at bedside.  I evaluated in ct- history from neuro pa and ed rn as above.  Patient on table.  Patient taken to IR by neurologist prior to my full evaluation.   Pattricia Boss, MD 01/25/16 [phone]

## 2016-01-25 NOTE — H&P (Signed)
Requesting Physician: Dr. Jeanell Sparrow     Reason for consultation:  Stroke code    HPI:                                                                                                                                         Adrienne [name] is an 82 y.o. female patient who  was brought into the emergency room with right sided weakness, aphasia, post fall. History provided by her husband. Patient is aphasic and unable to provide any history. The patient has been both wake up around 6:15 AM and patient was in her normal state of health, no weakness no speech problems at that time. Last seen normal by husband was at 7 AM  The patient went to take shower and she appeared he fell in the bathroom and had some glass on her Feet during the Fall. And has been checked on her after the fall, she was noted to have right-sided weakness, unable to respond to commands. Hence she was brought into the ER for further evaluation and stroke code was called by the EMS.  Patient has a history of atrial fibrillation, on Coumadin. She was admitted for GI bleed evaluation with a drop in hematocrit 3 weeks ago and after completion of extensive GI workup, Coumadin was restarted 1 week ago. Her INR today was 1.5.  Date last known well:  2017, May 15th Time last known well:  At  7 am tPA Given: No: recent GI bleed less than 3 weeks ago, on coumadin  Stroke Risk Factors - atrial fibrillation, hypercoagulable state and history of breast cancer  Past Medical History: Past Medical History  Diagnosis Date  . Afib ([place])   . Elevated uric acid in blood   . Arthritis of ankle   . Pulmonary HTN ([place])   . Long-term (current) use of anticoagulants   . Moderate mitral regurgitation   . CAD (coronary artery disease)   . Edema   . Cancer [facility]) 2012    breast cancer-right  . Hypertension   . Urinary incontinence   . TIA (transient ischemic attack) 09/2011  . Diverticulosis   . Compression fracture of T12 vertebra [facility])     Past  Surgical History  Procedure Laterality Date  . Cardiac catheterization  2013  . Replacement total knee bilateral    . Gallbladder surgery    . Appendectomy    . Mastectomy Right   . Cataract extraction, bilateral    . Breast surgery  2012    Rt.mastectomy--[place], [place]  . Tonsillectomy and adenoidectomy      as a child  . Dilation and curettage of uterus      in her early 1's for DUB  . Esophagogastroduodenoscopy Left 01/08/2016    Procedure: ESOPHAGOGASTRODUODENOSCOPY (EGD);  Surgeon: Arta Silence, MD;  Location: [facility];  Service: Endoscopy;  Laterality: Left;    Family History: Family  History  Problem Relation Age of Onset  . Asthma Mother   . CVA Father   . Atrial fibrillation Sister     HAS PACER  . Pneumonia Brother   . Cancer Brother     BREAST CANCER  . Cancer Daughter     COLON CANCER    Social History:   reports that she has quit smoking. Her smoking use included Cigarettes. She does not have any smokeless tobacco history on file. She reports that she does not drink alcohol or use illicit drugs.  Allergies:  Allergies  Allergen Reactions  . Amiodarone Hcl [Amiodarone]     AFIB  . Compazine [Prochlorperazine]     HYPER  . Other Other (See Comments)    "Kidney dye"--patient states this gave her severe shakes/chills  . Thorazine [Chlorpromazine]     HYPER  . Valium [Diazepam]     HYPER   . Zometa [Zoledronic Acid]     PROBLEMS WITH EYES     Medications:                                                                                                                         Current facility-administered medications:  .   stroke: mapping our early stages of recovery book, , Does not apply, Once, Fredie Majano Yahoo, MD .  0.9 %  sodium chloride infusion, , Intravenous, Continuous, Andrey Mccaskill Fuller Mandril, MD .  acetaminophen (TYLENOL) tablet 650 mg, 650 mg, Oral, Q4H PRN **OR** acetaminophen (TYLENOL) suppository 650 mg, 650 mg,  Rectal, Q4H PRN, Armina Galloway Fuller Mandril, MD .  ceFAZolin (ANCEF) 2-4 GM/100ML-% IVPB, , , , , 2 g at 01/25/16 0925 .  diphenhydrAMINE (BENADRYL) injection 50 mg, 50 mg, Intravenous, to XRAY, Pattricia Boss, MD .  iopamidol (ISOVUE-300) 61 % injection, , , ,  .  labetalol (NORMODYNE,TRANDATE) injection 10 mg, 10 mg, Intravenous, Q10 min PRN, Manessa Buley Fuller Mandril, MD .  lidocaine (XYLOCAINE) 1 % (with pres) injection, , , ,  .  methylPREDNISolone sodium succinate (SOLU-MEDROL) 125 mg/2 mL injection 125 mg, 125 mg, Intravenous, to XRAY, Pattricia Boss, MD .  nitroGLYCERIN 100 MCG/ML intra-arterial injection, , , ,  .  pantoprazole (PROTONIX) injection 40 mg, 40 mg, Intravenous, QHS, Youcef Klas Fuller Mandril, MD .  senna-docusate (Senokot-S) tablet 1 tablet, 1 tablet, Oral, QHS PRN, Macauley Mossberg Fuller Mandril, MD  Current outpatient prescriptions:  .  acetaminophen (TYLENOL) 500 MG tablet, Take 1,000 mg by mouth 2 (two) times daily as needed for mild pain, moderate pain, fever or headache., Disp: , Rfl:  .  anastrozole (ARIMIDEX) 1 MG tablet, Take 1 mg by mouth daily., Disp: , Rfl:  .  CALCIUM CITRATE PO, Take 630 mg by mouth 2 (two) times daily., Disp: , Rfl:  .  Cholecalciferol (VITAMIN D PO), Take 1,900 mg by mouth daily. , Disp: , Rfl:  .  diltiazem (CARDIZEM) 60 MG tablet, Take 1 tablet (60 mg total) by  mouth 3 (three) times daily., Disp: 270 tablet, Rfl: 3 .  HYDROcodone-acetaminophen (NORCO/VICODIN) 5-325 MG tablet, Take 1 tablet by mouth every 8 (eight) hours as needed for moderate pain., Disp: , Rfl:  .  metoprolol (LOPRESSOR) 100 MG tablet, Take 1 tablet (100 mg total) by mouth 2 (two) times daily., Disp: 180 tablet, Rfl: 3 .  omeprazole (PRILOSEC) 40 MG capsule, Take 40 mg by mouth daily. , Disp: , Rfl:  .  trimethoprim (TRIMPEX) 100 MG tablet, Take 1 tablet by mouth daily. , Disp: , Rfl: 11 .  vitamin C (ASCORBIC ACID) 500 MG tablet, Take 500 mg by mouth 2 (two) times daily.,  Disp: , Rfl:   Facility-Administered Medications Ordered in Other Encounters:  .  0.9 %  sodium chloride infusion, , , Continuous PRN, Izora Gala, CRNA .  diphenhydrAMINE (BENADRYL) injection, , , Anesthesia Intra-op, Izora Gala, CRNA, 50 mg at 01/25/16 0925 .  methylPREDNISolone sodium succinate (SOLU-MEDROL) 125 mg/2 mL injection, , , Anesthesia Intra-op, Izora Gala, CRNA, 125 mg at 01/25/16 0929 .  phenylephrine (NEO-SYNEPHRINE) 0.04 mg/mL in dextrose 5 % 250 mL infusion, 10 mg, , Continuous PRN, Izora Gala, CRNA, Last Rate: 30 mL/hr at 01/25/16 0920, 20 mcg/min at 01/25/16 0920 .  phenylephrine (NEO-SYNEPHRINE) injection, , , Anesthesia Intra-op, Izora Gala, CRNA, 80 mcg at 01/25/16 0912 .  propofol (DIPRIVAN) 10 mg/mL bolus/IV push, , , Anesthesia Intra-op, Izora Gala, CRNA, 80 mg at 01/25/16 0912 .  rocuronium (ZEMURON) injection, , , Anesthesia Intra-op, Izora Gala, CRNA, 50 mg at 01/25/16 0917 .  succinylcholine (ANECTINE) injection, , , Anesthesia Intra-op, Izora Gala, CRNA, 80 mg at 01/25/16 0912   ROS:                                                                                                                                       History unobtainable from patient due to mental status   Neurologic Examination:                                                                                                    Today's Vitals   01/25/16 0908  SpO2: 98%    Evaluation of higher integrative functions including: Level of alertness: drowsy Orientation to time, place and person - unable to assess Speech: Nonverbal, global aphasia   Test the following cranial nerves: Limited evaluation, right neglect, midline gaze with left gaze preference, right facial weakness Motor examination: Complete hemiplegia on the right upper and lower extremities, able  to move her left upper and lower extent he spontaneously and withdraws easily to light tactile stimulus on the left  side  Examination of sensation : Right hemi-neglect,  Examination of deep tendon reflexes: Grossly symmetric, positive Babinski on the right, normal plantar response on the left Test coordination: Unable to assess due to work cooperation . No abnormal involuntary movements or tremors noted.  Gait: Unable to assess   Lab Results: Basic Metabolic Panel:  Recent Labs Lab 01/25/16 0807 01/25/16 0814  NA 138 139  K 3.9 3.8  CL 103 102  CO2 26  --   GLUCOSE 120* 115*  BUN 10 11  CREATININE 1.03* 1.00  CALCIUM 9.1  --     Liver Function Tests:  Recent Labs Lab 01/25/16 0807  AST 18  ALT 12*  ALKPHOS 81  BILITOT 0.4  PROT 6.4*  ALBUMIN 3.3*   No results for input(s): LIPASE, AMYLASE in the last 168 hours. No results for input(s): AMMONIA in the last 168 hours.  CBC:  Recent Labs Lab 01/25/16 0807 01/25/16 0814  WBC 8.6  --   NEUTROABS 5.2  --   HGB 10.8* 11.9*  HCT 33.3* 35.0*  MCV 89.5  --   PLT 372  --     Cardiac Enzymes: No results for input(s): CKTOTAL, CKMB, CKMBINDEX, TROPONINI in the last 168 hours.  Lipid Panel: No results for input(s): CHOL, TRIG, HDL, CHOLHDL, VLDL, LDLCALC in the last 168 hours.  CBG: No results for input(s): GLUCAP in the last 168 hours.  Microbiology: No results found for this or any previous visit.   Imaging: Ct Head Wo Contrast  01/25/2016  ADDENDUM REPORT: 01/25/2016 09:01 ADDENDUM: Left hemisphere ASPECTS score = 9; small area of cortical hypodensity in the left M2 as seen on series 21, image 14. I gave Dr. Silverio Decamp a verbal preliminary ASPECTS score of "10" at 0845 hours today. [place] Stroke Program Early CT Score Normal score = 10 Electronically Signed   By: Genevie Ann M.D.   On: 01/25/2016 09:01  01/25/2016  CLINICAL DATA:  Right-sided weakness and neglect EXAM: CT HEAD WITHOUT CONTRAST TECHNIQUE: Contiguous axial images were obtained from the base of the skull through the vertex without intravenous contrast. COMPARISON:   None. FINDINGS: There is mild diffuse atrophy. There is no intracranial mass, hemorrhage, extra-axial fluid collection, or midline shift. There is evidence of a prior focal infarct at the level of the gray- white junction of the anterior right parietal lobe. There is patchy small vessel disease throughout the centra semiovale bilaterally. There is evidence of a prior infarct in the lateral left lentiform nucleus. No acute appearing infarct is evident. Middle cerebral artery show symmetric attenuation bilaterally. The bony calvarium appears intact. The mastoid air cells are clear. No intraorbital lesions are evident. IMPRESSION: Atrophy with small vessel disease in the periventricular white matter. Prior infarcts as noted above. No acute appearing infarct is evident. No hemorrhage or mass effect. Critical Value/emergent results were called by telephone at the time of interpretation on 01/25/2016 at 8:25 am to Dr. Silverio Decamp, neurology, who verbally acknowledged these results. Electronically Signed: By: Lowella Grip III M.D. On: 01/25/2016 08:25    Stroke Scales   NIHSS :  [place] Stroke Program Early CT score (ASPECTS) : 10   Pre stroke Modified Rankin Scale (mRS):  0    Assessment and plan:   Lamariah Hojnacki is an 82 y.o. female patient who presented with clinical symptoms suggestive of a  dense left MCA infarct. CT of the head did not show intracerebral hemorrhage. Although she is within the time window for IV TPA, she had a recent GI bleed with significant drop in her hematocrit 3 weeks ago, with GI workup showing diverticulitis at that time. Coumadin was restarted 1 week ago, with INR 1.5 today. Because of the major GI bleed recently, I do not consider her as a candidate for IV TPA at this time due to significantly increased risk of life-threatening bleeding.  Discussed this with her husband and daughter, who are in agreement. A stat CT and a gram of the head and neck and perfusion study  were obtained, which showed evidence of a left M2 with vascular occlusion with perfusion deficits in the posterior MCA vascular territory. She is within the time window for endovascular intervention, discuss this further with her family who agreed to proceed with intervention. Patient was taken to the ER suite and Dr. Estanislado Pandy performed bilateral common carotid arteriograms followed by complete revascularization of Occluded Lt MCA With x 1 pass with Solitaire 4 mmx 40 mm Retrieval device and 4 mg of superselective IA integrelin with TICI 3 revascullarization.   Patient is admitted to ICU, intubated, sedated propofol following the interventional procedure. CT of the head done following the procedure showed evolutionary changes of the expected left MCA infarct, no hemorrhage. She'll be on telemetry monitoring, echocardiogram ordered. Goal systolic blood pressures around 140.   Neurology stroke team will continue to follow starting tomorrow morning.

## 2016-01-25 NOTE — ED Notes (Signed)
Code stroke called [phone]. Pt. Transported to CT at this time. Stroke team with patient.

## 2016-01-26 ENCOUNTER — Encounter (HOSPITAL_COMMUNITY): Payer: Self-pay | Admitting: Interventional Radiology

## 2016-01-26 ENCOUNTER — Inpatient Hospital Stay (HOSPITAL_COMMUNITY): Payer: Medicare Other

## 2016-01-26 DIAGNOSIS — Z7901 Long term (current) use of anticoagulants: Secondary | ICD-10-CM

## 2016-01-26 DIAGNOSIS — I481 Persistent atrial fibrillation: Secondary | ICD-10-CM

## 2016-01-26 DIAGNOSIS — K5791 Diverticulosis of intestine, part unspecified, without perforation or abscess with bleeding: Secondary | ICD-10-CM

## 2016-01-26 DIAGNOSIS — I6789 Other cerebrovascular disease: Secondary | ICD-10-CM

## 2016-01-26 LAB — BASIC METABOLIC PANEL
ANION GAP: 12 (ref 5–15)
BUN: 7 mg/dL (ref 6–20)
CALCIUM: 8.7 mg/dL — AB (ref 8.9–10.3)
CO2: 23 mmol/L (ref 22–32)
Chloride: 107 mmol/L (ref 101–111)
Creatinine, Ser: 0.9 mg/dL (ref 0.44–1.00)
GFR calc non Af Amer: 56 mL/min — ABNORMAL LOW (ref >60)
Glucose, Bld: 141 mg/dL — ABNORMAL HIGH (ref 65–99)
Potassium: 3.6 mmol/L (ref 3.5–5.1)
SODIUM: 142 mmol/L (ref 135–145)

## 2016-01-26 LAB — LIPID PANEL
CHOL/HDL RATIO: 3.3 ratio
CHOLESTEROL: 143 mg/dL (ref 0–200)
HDL: 43 mg/dL (ref >40)
LDL Cholesterol: 80 mg/dL (ref 0–99)
Triglycerides: 101 mg/dL (ref <150)
VLDL: 20 mg/dL (ref 0–40)

## 2016-01-26 LAB — CBC WITH DIFFERENTIAL/PLATELET
BASOS ABS: 0 10*3/uL (ref 0.0–0.1)
BASOS PCT: 0 %
Eosinophils Absolute: 0 10*3/uL (ref 0.0–0.7)
Eosinophils Relative: 0 %
HEMATOCRIT: 32.8 % — AB (ref 36.0–46.0)
HEMOGLOBIN: 10.7 g/dL — AB (ref 12.0–15.0)
Lymphocytes Relative: 10 %
Lymphs Abs: 0.9 10*3/uL (ref 0.7–4.0)
MCH: 29.5 pg (ref 26.0–34.0)
MCHC: 32.6 g/dL (ref 30.0–36.0)
MCV: 90.4 fL (ref 78.0–100.0)
MONO ABS: 0.1 10*3/uL (ref 0.1–1.0)
Monocytes Relative: 1 %
NEUTROS ABS: 8 10*3/uL — AB (ref 1.7–7.7)
NEUTROS PCT: 89 %
Platelets: 325 10*3/uL (ref 150–400)
RBC: 3.63 MIL/uL — ABNORMAL LOW (ref 3.87–5.11)
RDW: 14.2 % (ref 11.5–15.5)
WBC: 8.9 10*3/uL (ref 4.0–10.5)

## 2016-01-26 LAB — GLUCOSE, CAPILLARY
GLUCOSE-CAPILLARY: 125 mg/dL — AB (ref 65–99)
GLUCOSE-CAPILLARY: 103 mg/dL — AB (ref 65–99)
Glucose-Capillary: 140 mg/dL — ABNORMAL HIGH (ref 65–99)

## 2016-01-26 LAB — PHOSPHORUS: PHOSPHORUS: 4.3 mg/dL (ref 2.5–4.6)

## 2016-01-26 LAB — PROTIME-INR
INR: 1.67 — AB (ref 0.00–1.49)
PROTHROMBIN TIME: 19.7 s — AB (ref 11.6–15.2)

## 2016-01-26 LAB — ECHOCARDIOGRAM COMPLETE
HEIGHTINCHES: 67 in
Weight: 2338.64 oz

## 2016-01-26 LAB — MAGNESIUM: MAGNESIUM: 1.9 mg/dL (ref 1.7–2.4)

## 2016-01-26 MED ORDER — ASPIRIN EC 325 MG PO TBEC
325.0000 mg | DELAYED_RELEASE_TABLET | Freq: Every day | ORAL | Status: DC
  Administered 2016-01-26: 325 mg via ORAL
  Filled 2016-01-26: qty 1

## 2016-01-26 MED ORDER — DILTIAZEM HCL 30 MG PO TABS
60.0000 mg | ORAL_TABLET | Freq: Three times a day (TID) | ORAL | Status: DC
  Administered 2016-01-26 – 2016-01-29 (×9): 60 mg via ORAL
  Filled 2016-01-26 (×5): qty 2
  Filled 2016-01-26 (×3): qty 1
  Filled 2016-01-26: qty 2

## 2016-01-26 MED ORDER — LORAZEPAM 2 MG/ML IJ SOLN
1.0000 mg | Freq: Once | INTRAMUSCULAR | Status: AC
  Administered 2016-01-27: 1 mg via INTRAVENOUS
  Filled 2016-01-26: qty 1

## 2016-01-26 MED ORDER — ENOXAPARIN SODIUM 40 MG/0.4ML ~~LOC~~ SOLN
40.0000 mg | SUBCUTANEOUS | Status: DC
  Administered 2016-01-26: 40 mg via SUBCUTANEOUS
  Filled 2016-01-26: qty 0.4

## 2016-01-26 MED ORDER — ALPRAZOLAM 0.5 MG PO TABS
0.5000 mg | ORAL_TABLET | Freq: Once | ORAL | Status: AC
  Administered 2016-01-26: 0.5 mg via ORAL
  Filled 2016-01-26: qty 1

## 2016-01-26 MED ORDER — ASPIRIN 300 MG RE SUPP: 300.0000 mg | Freq: Every day | RECTAL | Status: DC

## 2016-01-26 NOTE — Progress Notes (Signed)
Initial Nutrition Assessment  DOCUMENTATION CODES:   Non-severe (moderate) malnutrition in context of acute illness/injury  INTERVENTION:  -If nutrition support within [place], recommend initiation tube feeding via NGT with Vital High Protein @ goal rate of 10 ml/hr (240 ml/d)with 60 ml Prostat TID. -Tube feeding regimen + propofol (525 kcals) provides 1365 kcals and 111 g of protein (meets 100% needs) NUTRITION DIAGNOSIS:   Inadequate oral intake related to inability to eat as evidenced by NPO status.  GOAL:   Patient will meet greater than or equal to 90% of their needs  MONITOR:   Vent status, Labs, Weight trends, Skin, I & O's  REASON FOR ASSESSMENT:   Ventilator    ASSESSMENT:   Pt found down in home. Called code stroke in EMS.Pt had complete right hemiplegia. She was taken to IR where she was intubated and had cerebral angiogram with complete revascularization of occluded left MCA.  Weight loss of 7.8% over past 2 months which is significant for time frame. Unable to diagnose malnutrition at this time.   Pt on vent. Temp (24hrs), Avg:97.4 F (36.3 C), Min:97.3 F (36.3 C), Max:97.4 F (36.3 C) Ve: 7.4 ml Propofol @ 19.9 ml/hr (providing 525 kcals)  Tube feeding recommendations provided if nutrition support is within [place] [address].  NFPE: No muscle depletion, no fat depletion, mild edema.  Labs reviewed; Ca 8.7, GFR 56, CBGs 125-140.  Meds reviewed.  Diet Order:  Diet NPO time specified  Skin:  Reviewed, no issues  Last BM:  unknown  Height:   Ht Readings from Last 1 Encounters:  01/25/16 5\' 7"  (1.702 m)    Weight:   Wt Readings from Last 1 Encounters:  01/25/16 146 lb 2.6 oz (66.3 kg)    Ideal Body Weight:  61.4 kg  BMI:  Body mass index is 22.89 kg/(m^2).  Estimated Nutritional Needs:   Kcal:  1169 kcals  Protein:  100 g  Fluid:  per MD  EDUCATION NEEDS:   No education needs identified at this time  Geoffery Lyons, [place] [facility]  Intern Pager [phone]

## 2016-01-26 NOTE — Progress Notes (Signed)
Referring Physician(s): Dr Rosalin Hawking  Supervising Physician: Luanne Bras  Patient Status: In-pt  Chief Complaint:      Expand All Collapse All   bilateral common carotid arteriograms followed by complete revascularization of Occluded Lt MCA With x 1 pass with Solitaire 4 mmx 40 mm Retrieval device and 4 mg of superselective IA integrelin with TICI 3 revascullarization    5/15 in IR with Dr Estanislado Pandy  Subjective:  Responding to me this am Intubated but can nod yes/no appropriately Moving left to command Moves Rt foot No movement on Rt hand  Allergies: Amiodarone hcl; Compazine; Other; Thorazine; Valium; and Zometa  Medications: Prior to Admission medications   Medication Sig Start Date End Date Taking? Authorizing Provider  acetaminophen (TYLENOL) 500 MG tablet Take 500-1,000 mg by mouth every 12 (twelve) hours as needed (pain).    Yes Historical Provider, MD  anastrozole (ARIMIDEX) 1 MG tablet Take 1 mg by mouth daily.   Yes Historical Provider, MD  CALCIUM PO Take 1 tablet by mouth daily at 3 pm.   Yes Historical Provider, MD  Cholecalciferol (VITAMIN D PO) Take 1 tablet by mouth daily.    Yes Historical Provider, MD  diltiazem (CARDIZEM) 60 MG tablet Take 1 tablet (60 mg total) by mouth 3 (three) times daily. 06/10/15  Yes Jerline Pain, MD  HYDROcodone-acetaminophen (NORCO/VICODIN) 5-325 MG tablet Take 1 tablet by mouth every 8 (eight) hours as needed for moderate pain.   Yes Historical Provider, MD  metoprolol (LOPRESSOR) 100 MG tablet Take 1 tablet (100 mg total) by mouth 2 (two) times daily. 06/10/15  Yes Jerline Pain, MD  omeprazole (PRILOSEC) 40 MG capsule Take 40 mg by mouth daily.    Yes Historical Provider, MD  trimethoprim (TRIMPEX) 100 MG tablet Take 100 mg by mouth daily.  11/10/15  Yes Historical Provider, MD  warfarin (COUMADIN) 4 MG tablet Take 4 mg by mouth daily at 3 pm. Name Brand   Yes Historical Provider, MD     Vital Signs: BP 148/83  mmHg  Pulse 90  Temp(Src) 97.3 F (36.3 C) (Axillary)  Resp 16  Ht 5\' 7"  (1.702 m)  Wt 146 lb 2.6 oz (66.3 kg)  BMI 22.89 kg/m2  SpO2 100%  LMP 09/12/1973 (Approximate)  Physical Exam  Eyes:  Eyes follow me Will not close or open to command  Cardiovascular: Normal rate.   Pulmonary/Chest:  vent  Musculoskeletal:  Moving left to command Moving rt foot to command No movement on right arm/hand Follows with eyes Nods yes/no appropriately   Skin: Skin is warm and dry.  Rt groin NT no bleeding No hematoma Rt foot 2+ pulses  Nursing note and vitals reviewed.   Imaging: Ct Angio Head W/cm &/or Wo Cm  01/25/2016  CLINICAL DATA:  82 year old female code stroke, last seen normal a 0630 hours. Left MCA large vessel infarcts suspected clinically. Initial encounter. EXAM: CT ANGIOGRAPHY HEAD AND NECK CT BRAIN PERFUSION TECHNIQUE: Multidetector CT imaging of the head and neck was performed using the standard protocol during bolus administration of intravenous contrast. Multiplanar CT image reconstructions and MIPs were obtained to evaluate the vascular anatomy. Carotid stenosis measurements (when applicable) are obtained utilizing NASCET criteria, using the distal internal carotid diameter as the denominator. CONTRAST:  100 mL Isovue 370. COMPARISON:  Noncontrast head CT 0808 hours today. FINDINGS: CT BRAIN PERFUSION Source images reveal evidence of a posterior division left MCA M2 occlusion. Perfusion maps demonstrate decreased cerebral blood flow throughout  the posterior and middle left MCA territory with associated abnormal mean transit time and time to peak (more abnormal in the posterior than the middle left MCA divisions). At the same time the cerebral blood volume remains symmetric to the right hemisphere. CTA NECK Skeleton: Cervical spine and left TMJ degeneration. Cervical spine findings today reported separately. No acute osseous abnormality identified. Paranasal sinuses and mastoids are  clear. Other neck: Apical pulmonary septal thickening. Mild mosaic ground-glass opacity. No superior mediastinal lymphadenopathy. Small bilateral benign-appearing thyroid nodules. Larynx, pharynx, parapharyngeal spaces, retropharyngeal space, sublingual space, submandibular glands (right submandibular gland ptosis), and bilateral parotid glands are within normal limits. No cervical lymphadenopathy. Aortic arch: 3 vessel arch configuration. Mild to moderate calcified arch atherosclerosis. No great vessel origin stenosis. Right carotid system: Tortuous proximal right CCA. Intermittent mild right CCA calcified plaque without stenosis. Confluent calcified plaque at the right ICA origin and bulb without stenosis. Negative cervical right ICA otherwise. Left carotid system: Minimal calcified plaque at the left CCA origin without stenosis. Moderately tortuous left CCA. Confluent calcified plaque at the left carotid bifurcation, left ICA origin, and bulb. Subsequent stenosis of 60 % with respect to the distal vessel. Distal to the bulb the cervical left ICA is tortuous but otherwise negative. Vertebral arteries:No proximal subclavian artery stenosis despite soft and calcified plaque worse on the left. No definite vertebral artery stenosis. Tortuous left V1 segment. Fairly codominant vertebral arteries are normal to the skullbase. CTA HEAD Posterior circulation: Mildly dominant distal right vertebral artery. The left is diminutive beyond the PICA origin. Right PICA origin also is normal. No basilar artery stenosis. Normal SCA and left PCA origins. Fetal type right PCA origin. Left PCA branches are within normal limits. The inferior right PCA P3 division is diminutive or stenotic. The left posterior communicating artery is diminutive or absent. Anterior circulation: Patent left ICA siphon with moderate calcified plaque. Up to moderate supraclinoid left ICA stenosis related to calcified plaque (series 508, image 244). The left  ICA terminus and left MCA origin remain patent. The left MCA M1 segment is patent. There is thrombus at the left MCA bifurcation and occlusion of the left MCA posterior division. There is also occlusion of the middle left MCA division (series 509, image 112). There is occlusion at the origin of the anterior division (series 509, image 111) with preserved distal flow. Patent right ICA siphon with moderate calcified atherosclerosis. Mild to moderate supra clinoid right ICA stenosis. Normal ophthalmic and right posterior communicating artery origins. Normal right ICA terminus. Normal bilateral ACA and right MCA origins. Anterior communicating artery and proximal ACA branches are within normal limits. There is mild to moderate distal ACA irregularity. Right MCA M1 segment, bifurcation, and proximal right MCA branches are within normal limits. There is mild to moderate distal right MCA branch irregularity. Venous sinuses: Patent. Anatomic variants: Dominant distal right vertebral artery. Fetal type right PCA origin. IMPRESSION: 1. Positive for Emergent Large Vessel Occlusion at The left MCA bifurcation. Abnormal left MCA perfusion with noncontrast head CT and CT Brain Perfusion parameters favorable for clot retrieval. This was discussed in person with Dr. Audria Nine at 0845 hours. The patient is undergoing intra arterial Neurointervention at this time. 2. Superimposed atherosclerotic stenosis at the left ICA origin (60-65%) and left supraclinoid ICA siphon (moderate) related to calcified plaque. 3. Superimposed mild to moderate second and third order branch intracranial atherosclerosis including in the bilateral ACA, right MCA, and left PCA branches. 4. Right carotid bifurcation calcified plaque without  stenosis. Electronically Signed   By: Genevie Ann M.D.   On: 01/25/2016 10:04   Ct Head Wo Contrast  01/25/2016  CLINICAL DATA:  Stroke. Left MCA occlusion status post endovascular revascularization. EXAM: CT HEAD  WITHOUT CONTRAST TECHNIQUE: Contiguous axial images were obtained from the base of the skull through the vertex without intravenous contrast. COMPARISON:  Head CT and CTA earlier today FINDINGS: Residual intravascular contrast is present related to recent angiogram. There is subtly decreased gray-white differentiation with slight early sulcal effacement involving the left temporoparietal region in the posterior MCA distribution consistent with early infarct. Minimal contrast staining and some retained intravascular contrast are noted in this region. No parenchymal hematoma is seen. There is no midline shift or extra-axial fluid collection. Mild cerebral atrophy and advanced chronic small vessel ischemic changes are again noted as well as a chronic right parietal cortical infarct. There is a dilated perivascular space versus chronic lacunar infarct in the inferior left putamen. Prior bilateral cataract extraction is noted. The visualized paranasal sinuses and mastoid air cells are clear. No acute osseous abnormality is identified. Calcified atherosclerosis is noted at the skullbase, and there are left TMJ joint degenerative changes. IMPRESSION: Early changes of acute posterior left MCA infarct. No parenchymal hematoma. Electronically Signed   By: Logan Bores M.D.   On: 01/25/2016 12:16   Ct Head Wo Contrast  01/25/2016  ADDENDUM REPORT: 01/25/2016 09:01 ADDENDUM: Left hemisphere ASPECTS score = 9; small area of cortical hypodensity in the left M2 as seen on series 21, image 14. I gave Dr. Silverio Decamp a verbal preliminary ASPECTS score of "10" at 0845 hours today. [place] Stroke Program Early CT Score Normal score = 10 Electronically Signed   By: Genevie Ann M.D.   On: 01/25/2016 09:01  01/25/2016  CLINICAL DATA:  Right-sided weakness and neglect EXAM: CT HEAD WITHOUT CONTRAST TECHNIQUE: Contiguous axial images were obtained from the base of the skull through the vertex without intravenous contrast. COMPARISON:  None.  FINDINGS: There is mild diffuse atrophy. There is no intracranial mass, hemorrhage, extra-axial fluid collection, or midline shift. There is evidence of a prior focal infarct at the level of the gray- white junction of the anterior right parietal lobe. There is patchy small vessel disease throughout the centra semiovale bilaterally. There is evidence of a prior infarct in the lateral left lentiform nucleus. No acute appearing infarct is evident. Middle cerebral artery show symmetric attenuation bilaterally. The bony calvarium appears intact. The mastoid air cells are clear. No intraorbital lesions are evident. IMPRESSION: Atrophy with small vessel disease in the periventricular white matter. Prior infarcts as noted above. No acute appearing infarct is evident. No hemorrhage or mass effect. Critical Value/emergent results were called by telephone at the time of interpretation on 01/25/2016 at 8:25 am to Dr. Silverio Decamp, neurology, who verbally acknowledged these results. Electronically Signed: By: Lowella Grip III M.D. On: 01/25/2016 08:25   Ct Angio Neck W/cm &/or Wo/cm  01/25/2016  CLINICAL DATA:  81 year old female code stroke, last seen normal a 0630 hours. Left MCA large vessel infarcts suspected clinically. Initial encounter. EXAM: CT ANGIOGRAPHY HEAD AND NECK CT BRAIN PERFUSION TECHNIQUE: Multidetector CT imaging of the head and neck was performed using the standard protocol during bolus administration of intravenous contrast. Multiplanar CT image reconstructions and MIPs were obtained to evaluate the vascular anatomy. Carotid stenosis measurements (when applicable) are obtained utilizing NASCET criteria, using the distal internal carotid diameter as the denominator. CONTRAST:  100 mL Isovue 370. COMPARISON:  Noncontrast head CT 0808 hours today. FINDINGS: CT BRAIN PERFUSION Source images reveal evidence of a posterior division left MCA M2 occlusion. Perfusion maps demonstrate decreased cerebral blood flow  throughout the posterior and middle left MCA territory with associated abnormal mean transit time and time to peak (more abnormal in the posterior than the middle left MCA divisions). At the same time the cerebral blood volume remains symmetric to the right hemisphere. CTA NECK Skeleton: Cervical spine and left TMJ degeneration. Cervical spine findings today reported separately. No acute osseous abnormality identified. Paranasal sinuses and mastoids are clear. Other neck: Apical pulmonary septal thickening. Mild mosaic ground-glass opacity. No superior mediastinal lymphadenopathy. Small bilateral benign-appearing thyroid nodules. Larynx, pharynx, parapharyngeal spaces, retropharyngeal space, sublingual space, submandibular glands (right submandibular gland ptosis), and bilateral parotid glands are within normal limits. No cervical lymphadenopathy. Aortic arch: 3 vessel arch configuration. Mild to moderate calcified arch atherosclerosis. No great vessel origin stenosis. Right carotid system: Tortuous proximal right CCA. Intermittent mild right CCA calcified plaque without stenosis. Confluent calcified plaque at the right ICA origin and bulb without stenosis. Negative cervical right ICA otherwise. Left carotid system: Minimal calcified plaque at the left CCA origin without stenosis. Moderately tortuous left CCA. Confluent calcified plaque at the left carotid bifurcation, left ICA origin, and bulb. Subsequent stenosis of 60 % with respect to the distal vessel. Distal to the bulb the cervical left ICA is tortuous but otherwise negative. Vertebral arteries:No proximal subclavian artery stenosis despite soft and calcified plaque worse on the left. No definite vertebral artery stenosis. Tortuous left V1 segment. Fairly codominant vertebral arteries are normal to the skullbase. CTA HEAD Posterior circulation: Mildly dominant distal right vertebral artery. The left is diminutive beyond the PICA origin. Right PICA origin also  is normal. No basilar artery stenosis. Normal SCA and left PCA origins. Fetal type right PCA origin. Left PCA branches are within normal limits. The inferior right PCA P3 division is diminutive or stenotic. The left posterior communicating artery is diminutive or absent. Anterior circulation: Patent left ICA siphon with moderate calcified plaque. Up to moderate supraclinoid left ICA stenosis related to calcified plaque (series 508, image 244). The left ICA terminus and left MCA origin remain patent. The left MCA M1 segment is patent. There is thrombus at the left MCA bifurcation and occlusion of the left MCA posterior division. There is also occlusion of the middle left MCA division (series 509, image 112). There is occlusion at the origin of the anterior division (series 509, image 111) with preserved distal flow. Patent right ICA siphon with moderate calcified atherosclerosis. Mild to moderate supra clinoid right ICA stenosis. Normal ophthalmic and right posterior communicating artery origins. Normal right ICA terminus. Normal bilateral ACA and right MCA origins. Anterior communicating artery and proximal ACA branches are within normal limits. There is mild to moderate distal ACA irregularity. Right MCA M1 segment, bifurcation, and proximal right MCA branches are within normal limits. There is mild to moderate distal right MCA branch irregularity. Venous sinuses: Patent. Anatomic variants: Dominant distal right vertebral artery. Fetal type right PCA origin. IMPRESSION: 1. Positive for Emergent Large Vessel Occlusion at The left MCA bifurcation. Abnormal left MCA perfusion with noncontrast head CT and CT Brain Perfusion parameters favorable for clot retrieval. This was discussed in person with Dr. Audria Nine at 0845 hours. The patient is undergoing intra arterial Neurointervention at this time. 2. Superimposed atherosclerotic stenosis at the left ICA origin (60-65%) and left supraclinoid ICA siphon (moderate)  related to calcified  plaque. 3. Superimposed mild to moderate second and third order branch intracranial atherosclerosis including in the bilateral ACA, right MCA, and left PCA branches. 4. Right carotid bifurcation calcified plaque without stenosis. Electronically Signed   By: Genevie Ann M.D.   On: 01/25/2016 10:04   Ct C-spine No Charge  01/25/2016  CLINICAL DATA:  Recent left middle cerebral artery infarct, recent fall EXAM: CT CERVICAL SPINE WITHOUT CONTRAST TECHNIQUE: Multidetector CT imaging of the cervical spine was performed without intravenous contrast. Multiplanar CT image reconstructions were also generated. COMPARISON:  None. FINDINGS: Seven cervical segments are well visualized. Vertebral body height is well maintained. Mild facet hypertrophic changes are noted as well as endplate osteophytes with disc space narrowing from C3-C7. No gross soft tissue abnormality is noted. No acute fracture or acute facet abnormality is seen. IMPRESSION: Multilevel degenerative change without acute abnormality. Electronically Signed   By: Inez Catalina M.D.   On: 01/25/2016 12:58   Ct Cerebral Perfusion W/cm  01/25/2016  CLINICAL DATA:  82 year old female code stroke, last seen normal a 0630 hours. Left MCA large vessel infarcts suspected clinically. Initial encounter. EXAM: CT ANGIOGRAPHY HEAD AND NECK CT BRAIN PERFUSION TECHNIQUE: Multidetector CT imaging of the head and neck was performed using the standard protocol during bolus administration of intravenous contrast. Multiplanar CT image reconstructions and MIPs were obtained to evaluate the vascular anatomy. Carotid stenosis measurements (when applicable) are obtained utilizing NASCET criteria, using the distal internal carotid diameter as the denominator. CONTRAST:  100 mL Isovue 370. COMPARISON:  Noncontrast head CT 0808 hours today. FINDINGS: CT BRAIN PERFUSION Source images reveal evidence of a posterior division left MCA M2 occlusion. Perfusion maps  demonstrate decreased cerebral blood flow throughout the posterior and middle left MCA territory with associated abnormal mean transit time and time to peak (more abnormal in the posterior than the middle left MCA divisions). At the same time the cerebral blood volume remains symmetric to the right hemisphere. CTA NECK Skeleton: Cervical spine and left TMJ degeneration. Cervical spine findings today reported separately. No acute osseous abnormality identified. Paranasal sinuses and mastoids are clear. Other neck: Apical pulmonary septal thickening. Mild mosaic ground-glass opacity. No superior mediastinal lymphadenopathy. Small bilateral benign-appearing thyroid nodules. Larynx, pharynx, parapharyngeal spaces, retropharyngeal space, sublingual space, submandibular glands (right submandibular gland ptosis), and bilateral parotid glands are within normal limits. No cervical lymphadenopathy. Aortic arch: 3 vessel arch configuration. Mild to moderate calcified arch atherosclerosis. No great vessel origin stenosis. Right carotid system: Tortuous proximal right CCA. Intermittent mild right CCA calcified plaque without stenosis. Confluent calcified plaque at the right ICA origin and bulb without stenosis. Negative cervical right ICA otherwise. Left carotid system: Minimal calcified plaque at the left CCA origin without stenosis. Moderately tortuous left CCA. Confluent calcified plaque at the left carotid bifurcation, left ICA origin, and bulb. Subsequent stenosis of 60 % with respect to the distal vessel. Distal to the bulb the cervical left ICA is tortuous but otherwise negative. Vertebral arteries:No proximal subclavian artery stenosis despite soft and calcified plaque worse on the left. No definite vertebral artery stenosis. Tortuous left V1 segment. Fairly codominant vertebral arteries are normal to the skullbase. CTA HEAD Posterior circulation: Mildly dominant distal right vertebral artery. The left is diminutive  beyond the PICA origin. Right PICA origin also is normal. No basilar artery stenosis. Normal SCA and left PCA origins. Fetal type right PCA origin. Left PCA branches are within normal limits. The inferior right PCA P3 division is diminutive or stenotic. The  left posterior communicating artery is diminutive or absent. Anterior circulation: Patent left ICA siphon with moderate calcified plaque. Up to moderate supraclinoid left ICA stenosis related to calcified plaque (series 508, image 244). The left ICA terminus and left MCA origin remain patent. The left MCA M1 segment is patent. There is thrombus at the left MCA bifurcation and occlusion of the left MCA posterior division. There is also occlusion of the middle left MCA division (series 509, image 112). There is occlusion at the origin of the anterior division (series 509, image 111) with preserved distal flow. Patent right ICA siphon with moderate calcified atherosclerosis. Mild to moderate supra clinoid right ICA stenosis. Normal ophthalmic and right posterior communicating artery origins. Normal right ICA terminus. Normal bilateral ACA and right MCA origins. Anterior communicating artery and proximal ACA branches are within normal limits. There is mild to moderate distal ACA irregularity. Right MCA M1 segment, bifurcation, and proximal right MCA branches are within normal limits. There is mild to moderate distal right MCA branch irregularity. Venous sinuses: Patent. Anatomic variants: Dominant distal right vertebral artery. Fetal type right PCA origin. IMPRESSION: 1. Positive for Emergent Large Vessel Occlusion at The left MCA bifurcation. Abnormal left MCA perfusion with noncontrast head CT and CT Brain Perfusion parameters favorable for clot retrieval. This was discussed in person with Dr. Audria Nine at 0845 hours. The patient is undergoing intra arterial Neurointervention at this time. 2. Superimposed atherosclerotic stenosis at the left ICA origin (60-65%)  and left supraclinoid ICA siphon (moderate) related to calcified plaque. 3. Superimposed mild to moderate second and third order branch intracranial atherosclerosis including in the bilateral ACA, right MCA, and left PCA branches. 4. Right carotid bifurcation calcified plaque without stenosis. Electronically Signed   By: Genevie Ann M.D.   On: 01/25/2016 10:04    Labs:  CBC:  Recent Labs  01/09/16 0433 01/10/16 0517 01/25/16 0807 01/25/16 0814 01/26/16 0300  WBC 10.0 9.5 8.6  --  8.9  HGB 9.5* 8.9* 10.8* 11.9* 10.7*  HCT 28.6* 26.4* 33.3* 35.0* 32.8*  PLT 226 213 372  --  325    COAGS:  Recent Labs  01/07/16 0458 01/08/16 0655 01/25/16 0807 01/26/16 0300  INR 1.97* 1.25 1.46 1.67*  APTT  --   --  32  --     BMP:  Recent Labs  01/09/16 0433 01/10/16 0517 01/25/16 0807 01/25/16 0814 01/26/16 0300  NA 139 140 138 139 142  K 3.9 3.9 3.9 3.8 3.6  CL 110 110 103 102 107  CO2 22 23 26   --  23  GLUCOSE 93 117* 120* 115* 141*  BUN 9 9 10 11 7   CALCIUM 8.2* 8.3* 9.1  --  8.7*  CREATININE 0.68 0.69 1.03* 1.00 0.90  GFRNONAA >60 >60 48*  --  56*  GFRAA >60 >60 55*  --  >60    LIVER FUNCTION TESTS:  Recent Labs  01/06/16 1243 01/25/16 0807  BILITOT 0.4 0.4  AST 17 18  ALT 17 12*  ALKPHOS 85 81  PROT 7.0 6.4*  ALBUMIN 4.0 3.3*    Assessment and Plan:  CVA L MCA clot retrieval 5/15 Vent Will report to Dr Estanislado Pandy Plan per Neuro  Electronically Signed: Monia Sabal A 01/26/2016, 8:54 AM   I spent a total of 15 Minutes at the the patient's bedside AND on the patient's hospital floor or unit, greater than 50% of which was counseling/coordinating care for left MCA revascularization

## 2016-01-26 NOTE — Progress Notes (Signed)
PT Cancellation Note  Patient Details Name: Adrienne [name] MRN: [id number] DOB: 1928/05/03   Cancelled Treatment:    Reason Eval/Treat Not Completed: Patient not medically ready, bedrest orders at this time   Duncan Dull 01/26/2016, 7:38 AM Alben Deeds, PT DPT  [phone]

## 2016-01-26 NOTE — Progress Notes (Signed)
SLP Cancellation Note  Patient Details Name: Adrienne [name] MRN: [id number] DOB: 13-Aug-1928   Cancelled treatment:        Pt intubated. Will follow along.   Houston Siren 01/26/2016, 10:36 AM   Orbie Pyo Colvin Caroli.[employer] [phone]

## 2016-01-26 NOTE — Procedures (Signed)
Extubation Procedure Note  Patient Details:   Name: Adrienne [name] DOB: [birth year] MRN: [id number]   Airway Documentation:     Evaluation  O2 sats: stable throughout Complications: No apparent complications Patient did tolerate procedure well. Bilateral Breath Sounds: Clear, Diminished   Yes  4l/min [place]  Revonda Standard 01/26/2016, 11:54 AM

## 2016-01-26 NOTE — Progress Notes (Signed)
OT Cancellation Note  Patient Details Name: Adrienne [name] MRN: [id number] DOB: [birth year]   Cancelled Treatment:    Reason Eval/Treat Not Completed: Patient not medically ready (bedrest) OT order received and appreciated however this conflicts with current bedrest order set. Please increase activity tolerance as appropriate and remove bedrest from orders. OT will hold evaluation at this time and will check back as time allows pending increased activity orders.   Vonita Moss   OTR/L Pager: [phone] Office: [phone] .  01/26/2016, 6:59 AM

## 2016-01-26 NOTE — Progress Notes (Signed)
STROKE TEAM PROGRESS NOTE   HISTORY OF PRESENT ILLNESS Adrienne [name] is an 82 y.o. female patient who was brought into the emergency room with right sided weakness, aphasia, post fall. History provided by her husband. Patient is aphasic and unable to provide any history. The patient woke up around 6:15 AM and patient was in her normal state of health, no weakness no speech problems at that time. Last seen normal by husband at 7 AM 01/25/2016.  The patient went to take shower and she appeared to fall in the bathroom and got some glass on her feet during the fall. When he checked on her after the fall, she was noted to have right-sided weakness, unable to respond to commands. Hence she was brought into the ER for further evaluation and stroke code was called by the EMS.  Patient has a history of atrial fibrillation, on Coumadin. She was admitted for GI bleed evaluation with a drop in hematocrit 3 weeks ago and after completion of extensive GI workup, Coumadin was restarted 1 week ago. Her INR on arrival was 1.5. NIHSS 23. mRS 0.  Patient was not administered IV t-PA secondary to  recent GI bleed less than 3 weeks ago, on coumadin. She was admitted to the neuro ICU for further evaluation and treatment.   SUBJECTIVE (INTERVAL HISTORY) Her husband and daughter are at the bedside.  Pt wide awake and alert, still intubated and on ventilation. Her right arm and leg much mobile than yesterday. MRI pending.    OBJECTIVE Temp:  [97.3 F (36.3 C)-98.9 F (37.2 C)] 97.3 F (36.3 C) (05/16 0400) Pulse Rate:  [61-108] 81 (05/16 0900) Cardiac Rhythm:  [-] Atrial fibrillation (05/16 0800) Resp:  [10-19] 18 (05/16 0900) BP: (102-171)/(61-105) 148/83 mmHg (05/16 0800) SpO2:  [93 %-100 %] 100 % (05/16 0900) FiO2 (%):  [40 %-100 %] 40 % (05/16 0900) Weight:  [66.3 kg (146 lb 2.6 oz)-69.9 kg (154 lb 1.6 oz)] 66.3 kg (146 lb 2.6 oz) (05/15 1230)  CBC:  Recent Labs Lab 01/25/16 0807 01/25/16 0814  01/26/16 0300  WBC 8.6  --  8.9  NEUTROABS 5.2  --  8.0*  HGB 10.8* 11.9* 10.7*  HCT 33.3* 35.0* 32.8*  MCV 89.5  --  90.4  PLT 372  --  [id number]    Basic Metabolic Panel:  Recent Labs Lab 01/25/16 0807 01/25/16 0814 01/26/16 0300  NA 138 139 142  K 3.9 3.8 3.6  CL 103 102 107  CO2 26  --  23  GLUCOSE 120* 115* 141*  BUN 10 11 7   CREATININE 1.03* 1.00 0.90  CALCIUM 9.1  --  8.7*  MG  --   --  1.9  PHOS  --   --  4.3    Lipid Panel:    Component Value Date/Time   CHOL 143 01/26/2016 0300   TRIG 101 01/26/2016 0300   HDL 43 01/26/2016 0300   CHOLHDL 3.3 01/26/2016 0300   VLDL 20 01/26/2016 0300   LDLCALC 80 01/26/2016 0300   HgbA1c: No results found for: HGBA1C Urine Drug Screen:    Component Value Date/Time   LABOPIA NONE DETECTED 01/25/2016 1855   COCAINSCRNUR NONE DETECTED 01/25/2016 1855   LABBENZ NONE DETECTED 01/25/2016 1855   AMPHETMU NONE DETECTED 01/25/2016 1855   THCU NONE DETECTED 01/25/2016 1855   LABBARB NONE DETECTED 01/25/2016 1855     IMAGING I have personally reviewed the radiological images below and agree with the radiology interpretations.  Ct Head  Wo Contrast 01/25/2016  Early changes of acute posterior left MCA infarct. No parenchymal hematoma. 01/25/2016  ADDENDUM Left hemisphere ASPECTS score = 9; small area of cortical hypodensity in the left M2 as seen on series 21, image 14.had given verbal preliminary ASPECTS score of "10" at 0845 hours today. Hall M.D. 01/25/2016   Atrophy with small vessel disease in the periventricular white matter. Prior infarcts as noted above. No acute appearing infarct is evident. No hemorrhage or mass effect.   Ct Angio Head & Neck W/cm &/or Wo/cm 01/25/2016  1. Positive for Emergent Large Vessel Occlusion at The left MCA bifurcation. Abnormal left MCA perfusion with noncontrast head CT and CT Brain Perfusion parameters favorable for clot retrieval. 2. Superimposed atherosclerotic stenosis at the left ICA origin  (60-65%) and left supraclinoid ICA siphon (moderate) related to calcified plaque. 3. Superimposed mild to moderate second and third order branch intracranial atherosclerosis including in the bilateral ACA, right MCA, and left PCA branches. 4. Right carotid bifurcation calcified plaque without stenosis.   Ct C-spine No Charge 01/25/2016  Multilevel degenerative change without acute abnormality.   Ct Cerebral Perfusion W/cm 01/25/2016   1. Positive for Emergent Large Vessel Occlusion at The left MCA bifurcation. Abnormal left MCA perfusion with noncontrast head CT and CT Brain Perfusion parameters favorable for clot retrieval.   Cerebral angiogram 01/25/2016 bilateral common carotid arteriograms followed by complete revascularization of Occluded Lt MCA With x 1 pass with Solitaire 4 mmx 40 mm Retrieval device and 4 mg of superselective IA integrelin with TICI 3 revascullarization  TTE - - Left ventricle: The cavity size was normal. Systolic function was  normal. The estimated ejection fraction was in the range of 60%  to 65%. Wall motion was normal; there were no regional wall  motion abnormalities. The study is not technically sufficient to  allow evaluation of LV diastolic function. - Aortic valve: Valve mobility was mildly restricted. Transvalvular  velocity was within the normal range. There was no stenosis.  There was trivial regurgitation. - Mitral valve: Calcified annulus. Transvalvular velocity was  within the normal range. There was no evidence for stenosis.  There was trivial regurgitation. - Left atrium: The atrium was moderately dilated. - Right ventricle: The cavity size was normal. Wall thickness was  normal. Systolic function was normal. - Atrial septum: No defect or patent foramen ovale was identified  by color flow Doppler. - Tricuspid valve: There was mild regurgitation. - Pulmonary arteries: Systolic pressure was within the normal  range. PA peak pressure: 29 mm  Hg (S).   PHYSICAL EXAM  Temp:  [97.3 F (36.3 C)-97.4 F (36.3 C)] 97.4 F (36.3 C) (05/16 0800) Pulse Rate:  [61-96] 83 (05/16 1000) Resp:  [10-22] 22 (05/16 1000) BP: (102-171)/(61-105) 157/90 mmHg (05/16 1000) SpO2:  [93 %-100 %] 100 % (05/16 1000) FiO2 (%):  [40 %-100 %] 40 % (05/16 1000) Weight:  [146 lb 2.6 oz (66.3 kg)] 146 lb 2.6 oz (66.3 kg) (05/15 1230)  General - Well nourished, well developed, intubated on light sedation.  Ophthalmologic - Fundi not visualized due to noncooperation.  Cardiovascular - irregularly irregular heart rate and rhythm.  Neuro - awake, alert, intubated on light sedation. Not following commands, either peripheral or central commands. Eyes middle position, but able to move to both sides with nystagmus. Not blinking to visual threat bilaterally. No significant facial asymmetry. Positive gag and corneal. PERRL. Moving BUE spontaneously and equally. LLE 3/5 and RLE 2/5 on pain stimulation. DTR 1+,  b/l babinski negative. Sensation and coordination and gait not tested.    ASSESSMENT/PLAN Ms. Estine Couser is a 82 y.o. female with history of atrial fibrillation on coumadin INR 1.5 with GIB 3 weeks ago presenting with R sided weakness and receptive phasia following a fall. She did not receive IV t-PA per documenation due to hx GIB and INR 1.5. CTA showed a L M2 occlusion. She was taken to IR with complete TICI 3 revascularization of L MCA with Solitaire and IA integrelin.   Stroke:  Dominant left MCA infarct s/p TICI3 revascularization of L MCA M2 occlusion with mechanical thrombectomy and IA integrelin. Infarct embolic secondary to known atrial fibrillation in setting of subtherapeutic coumadin.  Resultant  Right side weakness  CTA Head & neck L MCA bifurcation occlusion, L ICA origin 60-65% stenosis, L ICA siphon moderate plaque. Intracranial atherosclerosis B ACA, R MCA, L PCA branches  CT perfusion L MCA perfusion deficit favorable for clot  retrieval  Cerebral angiogram L MCA occlusion s/p TICI3 revascularization with Solitaire and IA integrelin, no left ICA origin stenosis  MRI  pending   2D Echo  EF 60-65%   LDL 80  HgbA1c pending  lovenox and SCDs for VTE prophylaxis  Diet NPO time specified  warfarin daily prior to admission INR 1.5 on arrival, now on aspirin 300 mg suppository daily. Discussed with husband, will need to continue anticoagulation with eliquis due to low risk of GI bleeding than coumadin. Family agreed. Will start eliquis pending MRI.   Ongoing aggressive stroke risk factor management  Therapy recommendations:  pending   Disposition:  pending   Acute respiratory failure  Intubated  CCM following  Atrial Fibrillation  Home anticoagulation:  warfarin daily   INR 1.5 on admission  Rate controlled  Discussed with husband, will need to continue anticoagulation with eliquis due to low risk of GI bleeding than coumadin. Family agreed. Will start eliquis pending MRI.    Recent GI bleeding  Required PRBC transfusion  H&H stable this admission  Coumadin was discontinued due to LGIB  Coumadin was resumed one week ago but INR still not therapeutic  Will consider eliquis as it has lowest GIB risk among DOACs  Hypertension  Stable  Permissive hypertension (OK if < 220/120) but gradually normalize in 5-7 days  Hyperlipidemia  Home meds:  No statin  LDL 80, goal < 70  Add low dose statin once passed swallow  Continue statin at discharge  Other Stroke Risk Factors  Advanced age  Former Cigarette smoker  Hx stroke/TIA  Coronary artery disease  Other Active Problems  R breast cancer 2012 on anastrozole  GERD  Hospital day # 1  This patient is critically ill due to acute left MCA infarct s/p endovascular intervention, recent GIB, afib on coumadin, HTN and at significant risk of neurological worsening, death form hemorrhagic transformation, recurrent stroke, heart  failure. This patient's care requires constant monitoring of vital signs, hemodynamics, respiratory and cardiac monitoring, review of multiple databases, neurological assessment, discussion with family, other specialists and medical decision making of high complexity. I spent 40 minutes of neurocritical care time in the care of this patient.   Rosalin Hawking, MD PhD Stroke Neurology 01/26/2016 7:17 PM   To contact Stroke Continuity provider, please refer to [email or web address] After hours, contact General Neurology

## 2016-01-26 NOTE — Progress Notes (Signed)
PULMONARY / CRITICAL CARE MEDICINE   Name: Quanah Slinger MRN: [id number] DOB: Jan 14, 1928    ADMISSION DATE:  01/25/2016 CONSULTATION DATE:  01/25/16  REFERRING MD:  Estanislado Pandy  CHIEF COMPLAINT:  VDRF following CVA   SUBJECTIVE:  On vent. No issues overnight.  Awake, follows commands.   VITAL SIGNS: BP 148/83 mmHg  Pulse 81  Temp(Src) 97.4 F (36.3 C) (Axillary)  Resp 18  Ht 5\' 7"  (1.702 m)  Wt 66.3 kg (146 lb 2.6 oz)  BMI 22.89 kg/m2  SpO2 100%  LMP 09/12/1973 (Approximate)  HEMODYNAMICS:    VENTILATOR SETTINGS: Vent Mode:  [-] PRVC FiO2 (%):  [40 %-100 %] 40 % Set Rate:  [18 bmp] 18 bmp Vt Set:  [380 mL] 380 mL PEEP:  [5 cmH20] 5 cmH20 Plateau Pressure:  [13 cmH20-14 cmH20] 14 cmH20  INTAKE / OUTPUT: I/O last 3 completed shifts: In: 2120.2 [I.V.:2120.2] Out: [id number] [Urine:1605; Blood:65]   PHYSICAL EXAMINATION: General: Elderly female, resting in bed, in NAD. Follows simple commands.  Neuro: (-) lateralizing signs noted. Moves BUE equally. CN grossly intact.  HEENT: [place]/AT. PERRL, sclerae anicteric. Cardiovascular: RRR, no M/R/G.  Lungs: Respirations even and unlabored.  CTA bilaterally, No W/R/R. Abdomen: BS x 4, soft, NT/ND.  Musculoskeletal: No gross deformities, no edema.  Skin: Intact, warm, no rashes.    LABS:  BMET  Recent Labs Lab 01/25/16 0807 01/25/16 0814 01/26/16 0300  NA 138 139 142  K 3.9 3.8 3.6  CL 103 102 107  CO2 26  --  23  BUN 10 11 7   CREATININE 1.03* 1.00 0.90  GLUCOSE 120* 115* 141*    Electrolytes  Recent Labs Lab 01/25/16 0807 01/26/16 0300  CALCIUM 9.1 8.7*  MG  --  1.9  PHOS  --  4.3    CBC  Recent Labs Lab 01/25/16 0807 01/25/16 0814 01/26/16 0300  WBC 8.6  --  8.9  HGB 10.8* 11.9* 10.7*  HCT 33.3* 35.0* 32.8*  PLT 372  --  325    Coag's  Recent Labs Lab 01/25/16 0807 01/26/16 0300  APTT 32  --   INR 1.46 1.67*    Sepsis Markers No results for input(s): LATICACIDVEN, PROCALCITON,  O2SATVEN in the last 168 hours.  ABG  Recent Labs Lab 01/25/16 1240  PHART 7.439  PCO2ART 34.4*  PO2ART 418*    Liver Enzymes  Recent Labs Lab 01/25/16 0807  AST 18  ALT 12*  ALKPHOS 81  BILITOT 0.4  ALBUMIN 3.3*    Cardiac Enzymes No results for input(s): TROPONINI, PROBNP in the last 168 hours.  Glucose  Recent Labs Lab 01/25/16 2347 01/26/16 0838  GLUCAP 140* 125*    Imaging Ct Head Wo Contrast  01/25/2016  CLINICAL DATA:  Stroke. Left MCA occlusion status post endovascular revascularization. EXAM: CT HEAD WITHOUT CONTRAST TECHNIQUE: Contiguous axial images were obtained from the base of the skull through the vertex without intravenous contrast. COMPARISON:  Head CT and CTA earlier today FINDINGS: Residual intravascular contrast is present related to recent angiogram. There is subtly decreased gray-white differentiation with slight early sulcal effacement involving the left temporoparietal region in the posterior MCA distribution consistent with early infarct. Minimal contrast staining and some retained intravascular contrast are noted in this region. No parenchymal hematoma is seen. There is no midline shift or extra-axial fluid collection. Mild cerebral atrophy and advanced chronic small vessel ischemic changes are again noted as well as a chronic right parietal cortical infarct. There is a dilated perivascular  space versus chronic lacunar infarct in the inferior left putamen. Prior bilateral cataract extraction is noted. The visualized paranasal sinuses and mastoid air cells are clear. No acute osseous abnormality is identified. Calcified atherosclerosis is noted at the skullbase, and there are left TMJ joint degenerative changes. IMPRESSION: Early changes of acute posterior left MCA infarct. No parenchymal hematoma. Electronically Signed   By: Logan Bores M.D.   On: 01/25/2016 12:16   Ct C-spine No Charge  01/25/2016  CLINICAL DATA:  Recent left middle cerebral artery  infarct, recent fall EXAM: CT CERVICAL SPINE WITHOUT CONTRAST TECHNIQUE: Multidetector CT imaging of the cervical spine was performed without intravenous contrast. Multiplanar CT image reconstructions were also generated. COMPARISON:  None. FINDINGS: Seven cervical segments are well visualized. Vertebral body height is well maintained. Mild facet hypertrophic changes are noted as well as endplate osteophytes with disc space narrowing from C3-C7. No gross soft tissue abnormality is noted. No acute fracture or acute facet abnormality is seen. IMPRESSION: Multilevel degenerative change without acute abnormality. Electronically Signed   By: Inez Catalina M.D.   On: 01/25/2016 12:58     STUDIES:  CT head 05/15 > no acute process. CTA head / neck 05/15 > left MCA infarct.  CULTURES: None.  ANTIBIOTICS: None.  SIGNIFICANT EVENTS: 05/15 > admitted with left CVA infarct, taken to neuro IR for revascularization.  LINES/TUBES: ETT 05/15 >  DISCUSSION: 82 y.o. F admitted 05/15 with acute left MCA infarct.  She was taken to IR where she was intubated and had complete revascularization.  She then returned to the ICU on teh vent and PCCM was called for vent management.  ASSESSMENT / PLAN:  NEUROLOGIC A:   Acute left MCA infarct - likely due to A.fib (anticoagulation stopped in April due to GI bleed); s/p IR revascularization 05/15. Hx TIA, T12 compression fx. P:   Sedation:  Propofol gtt / Fentanyl PRN. RASS goal: 0 to -1. Daily WUA. Neuro following, stroke workup per them. Would recommend that neuro discuss timing of resuming anticoagulation with GI. Plan for MRI.   CARDIOVASCULAR A:  Hx A.fib (not on anticoagulation due to GI bleed in April 2017 - previously on warfarin), mitral regurg, CAD, HTN. P:  Monitor hemodynamics. Likely needs to resume anticoagulation given acute CVA (likely due to A.fib) - would recommend that primary team discuss timing with GI. Hold outpatient diltiazem,  metoprolol.  PULMONARY A: VDRF - due to acute left MCA infarct. P:   Full vent support. Wean as able. VAP prevention measures. SBT today. Anticipate, can extubate this am, assuming CXR is Normal.  CXR in AM.  RENAL A:   No acute issues. P:   NS @ 75. BMP in AM.  GASTROINTESTINAL A:   GERD. Nutrition. P: Continue outpatient PPI. NPO. Start TF if not extubated.   HEMATOLOGIC / ONCOLOGIC A:   Hx breast CA - 2012; no documentation in Epic; though is on daily anastrozole. VTE Prophylaxis. P:  Continue outpatient anastrozole per tube. SCD's.  INFECTIOUS A:   No indication of infection. P:   Monitor clinically.  ENDOCRINE A:   No acute issues.  P:   SSI if glucose consistently > 180.   Family updated: Husband updated at bedside.  Interdisciplinary Family Meeting v Palliative Care Meeting:  Due by: 01/31/16.  CC time: 31 minutes.    Monica Becton, MD 01/26/2016, 9:45 AM [facility] Pager [phone] After 3 pm or if no answer, call  [phone]        

## 2016-01-26 NOTE — Progress Notes (Signed)
Echocardiogram 2D Echocardiogram has been performed.  Adrienne [name] 01/26/2016, 10:24 AM

## 2016-01-26 NOTE — Progress Notes (Signed)
RT note- Patient passes SBT, MD called.

## 2016-01-27 ENCOUNTER — Inpatient Hospital Stay (HOSPITAL_COMMUNITY): Payer: Medicare Other

## 2016-01-27 DIAGNOSIS — I69398 Other sequelae of cerebral infarction: Secondary | ICD-10-CM

## 2016-01-27 DIAGNOSIS — I63412 Cerebral infarction due to embolism of left middle cerebral artery: Secondary | ICD-10-CM

## 2016-01-27 DIAGNOSIS — I159 Secondary hypertension, unspecified: Secondary | ICD-10-CM

## 2016-01-27 DIAGNOSIS — W19XXXA Unspecified fall, initial encounter: Secondary | ICD-10-CM

## 2016-01-27 DIAGNOSIS — R269 Unspecified abnormalities of gait and mobility: Secondary | ICD-10-CM

## 2016-01-27 LAB — GLUCOSE, CAPILLARY
GLUCOSE-CAPILLARY: 63 mg/dL — AB (ref 65–99)
GLUCOSE-CAPILLARY: 73 mg/dL (ref 65–99)
GLUCOSE-CAPILLARY: 79 mg/dL (ref 65–99)
GLUCOSE-CAPILLARY: 98 mg/dL (ref 65–99)
Glucose-Capillary: 67 mg/dL (ref 65–99)

## 2016-01-27 LAB — HEMOGLOBIN A1C
HEMOGLOBIN A1C: 5.6 % (ref 4.8–5.6)
Mean Plasma Glucose: 114 mg/dL

## 2016-01-27 LAB — BASIC METABOLIC PANEL
ANION GAP: 9 (ref 5–15)
BUN: 9 mg/dL (ref 6–20)
CALCIUM: 8.3 mg/dL — AB (ref 8.9–10.3)
CO2: 22 mmol/L (ref 22–32)
CREATININE: 0.75 mg/dL (ref 0.44–1.00)
Chloride: 112 mmol/L — ABNORMAL HIGH (ref 101–111)
Glucose, Bld: 90 mg/dL (ref 65–99)
Potassium: 3.8 mmol/L (ref 3.5–5.1)
SODIUM: 143 mmol/L (ref 135–145)

## 2016-01-27 LAB — CBC
HCT: 27.3 % — ABNORMAL LOW (ref 36.0–46.0)
HCT: 30.5 % — ABNORMAL LOW (ref 36.0–46.0)
HEMOGLOBIN: 10 g/dL — AB (ref 12.0–15.0)
Hemoglobin: 8.9 g/dL — ABNORMAL LOW (ref 12.0–15.0)
MCH: 29.4 pg (ref 26.0–34.0)
MCH: 30.1 pg (ref 26.0–34.0)
MCHC: 32.6 g/dL (ref 30.0–36.0)
MCHC: 32.8 g/dL (ref 30.0–36.0)
MCV: 90.1 fL (ref 78.0–100.0)
MCV: 91.9 fL (ref 78.0–100.0)
PLATELETS: 314 10*3/uL (ref 150–400)
PLATELETS: 274 10*3/uL (ref 150–400)
RBC: 3.03 MIL/uL — ABNORMAL LOW (ref 3.87–5.11)
RBC: 3.32 MIL/uL — ABNORMAL LOW (ref 3.87–5.11)
RDW: 14.8 % (ref 11.5–15.5)
RDW: 15 % (ref 11.5–15.5)
WBC: 11.8 10*3/uL — AB (ref 4.0–10.5)
WBC: 10.6 10*3/uL — ABNORMAL HIGH (ref 4.0–10.5)

## 2016-01-27 MED ORDER — METOPROLOL TARTRATE 100 MG PO TABS
100.0000 mg | ORAL_TABLET | Freq: Two times a day (BID) | ORAL | Status: DC
  Administered 2016-01-27 – 2016-01-29 (×4): 100 mg via ORAL
  Filled 2016-01-27 (×4): qty 1

## 2016-01-27 MED ORDER — APIXABAN 5 MG PO TABS
5.0000 mg | ORAL_TABLET | Freq: Two times a day (BID) | ORAL | Status: DC
  Administered 2016-01-27 – 2016-01-29 (×5): 5 mg via ORAL
  Filled 2016-01-27 (×6): qty 1

## 2016-01-27 MED ORDER — PANTOPRAZOLE SODIUM 40 MG PO TBEC
40.0000 mg | DELAYED_RELEASE_TABLET | Freq: Every day | ORAL | Status: DC
  Administered 2016-01-27 – 2016-01-29 (×3): 40 mg via ORAL
  Filled 2016-01-27 (×3): qty 1

## 2016-01-27 MED ORDER — ENSURE ENLIVE PO LIQD
237.0000 mL | Freq: Two times a day (BID) | ORAL | Status: DC
  Administered 2016-01-27 – 2016-01-29 (×5): 237 mL via ORAL

## 2016-01-27 MED ORDER — ATORVASTATIN CALCIUM 10 MG PO TABS
20.0000 mg | ORAL_TABLET | Freq: Every day | ORAL | Status: DC
  Administered 2016-01-27 – 2016-01-29 (×3): 20 mg via ORAL
  Filled 2016-01-27: qty 1
  Filled 2016-01-27 (×2): qty 2

## 2016-01-27 NOTE — Progress Notes (Signed)
Nutrition Follow-up  DOCUMENTATION CODES:   Non-severe (moderate) malnutrition in context of acute illness/injury  INTERVENTION:  Ensure Enlive BID. Each supplement provides 350 kcals and 20 grams of protein.   NUTRITION DIAGNOSIS:   Inadequate oral intake related to poor appetite as evidenced by per patient/family report.  Ongoing  GOAL:   Patient will meet greater than or equal to 90% of their needs  Unmet  MONITOR:   PO intake, Supplement acceptance, Labs, Weight trends, Skin, I & O's  ASSESSMENT:   Pt found down in home. Called code stroke in EMS.Pt had complete right hemiplegia. She was taken to IR where she was intubated and had cerebral angiogram with complete revascularization of occluded left MCA.  5/16 Pt extubated  Spoke with pt. Pt is alert and doing well. Denies difficulty swallowing.   Diet advanced to regular. Pt reports eating breakfast of eggs, bacon, milk, and orange juice. Pt denies N/V and abdominal pain. Pt amenable to receiving Ensure between meals to supplement intake.   Labs reviewed; CBGs 63-140, Cl 112, Ca 8.3.  Meds reviewed.  Diet Order:  Diet regular Room service appropriate?: Yes; Fluid consistency:: Thin  Skin:  Reviewed, no issues  Last BM:  unknown  Height:   Ht Readings from Last 1 Encounters:  01/25/16 5\' 7"  (1.702 m)    Weight:   Wt Readings from Last 1 Encounters:  01/25/16 146 lb 2.6 oz (66.3 kg)    Ideal Body Weight:  61.4 kg  BMI:  Body mass index is 22.89 kg/(m^2).  Estimated Nutritional Needs:   Kcal:  [id number]  Protein:  90-100  Fluid:  1.5-1.7 L  EDUCATION NEEDS:   No education needs identified at this time  Geoffery Lyons, [place] Dietetic Intern Pager [phone]

## 2016-01-27 NOTE — Progress Notes (Signed)
Gave 2 cups of orange juice at 830 and 900 for hypoglycemia.

## 2016-01-27 NOTE — Progress Notes (Signed)
I will follow up with pt and her spouse their plans for rehab when medically ready. [id number]

## 2016-01-27 NOTE — Progress Notes (Signed)
PULMONARY / CRITICAL CARE MEDICINE   Name: Adrienne [name] MRN: [id number] DOB: [birth year]    ADMISSION DATE:  01/25/2016 CONSULTATION DATE:  01/25/16  REFERRING MD:  Estanislado Pandy  CHIEF COMPLAINT:  VDRF following CVA   SUBJECTIVE:  Extubated on  5/16 > protecting her airway. Was sleepy but oriented x 3 on am rounds (received benzos for MRI). No other issues overnight.   VITAL SIGNS: BP 169/104 mmHg  Pulse 86  Temp(Src) 97.7 F (36.5 C) (Oral)  Resp 19  Ht 5\' 7"  (1.702 m)  Wt 66.3 kg (146 lb 2.6 oz)  BMI 22.89 kg/m2  SpO2 99%  LMP 09/12/1973 (Approximate)  HEMODYNAMICS:    VENTILATOR SETTINGS:    INTAKE / OUTPUT: I/O last 3 completed shifts: In: 3015.1 [I.V.:3015.1] Out: 1360 [Urine:1360]   PHYSICAL EXAMINATION: General: Elderly female, resting in bed, in NAD. Follows simple commands. Arousable.  Neuro: (-) lateralizing signs noted. Moves BUE equally. CN grossly intact.  HEENT: [place]/AT. PERRL, sclerae anicteric. Cardiovascular: RRR, no M/R/G.  Lungs: Respirations even and unlabored.  CTA bilaterally, No W/R/R. Abdomen: BS x 4, soft, NT/ND.  Musculoskeletal: No gross deformities, no edema.  Skin: Intact, warm, no rashes.    LABS:  BMET  Recent Labs Lab 01/25/16 0807 01/25/16 0814 01/26/16 0300 01/27/16 0340  NA 138 139 142 143  K 3.9 3.8 3.6 3.8  CL 103 102 107 112*  CO2 26  --  23 22  BUN 10 11 7 9   CREATININE 1.03* 1.00 0.90 0.75  GLUCOSE 120* 115* 141* 90    Electrolytes  Recent Labs Lab 01/25/16 0807 01/26/16 0300 01/27/16 0340  CALCIUM 9.1 8.7* 8.3*  MG  --  1.9  --   PHOS  --  4.3  --     CBC  Recent Labs Lab 01/25/16 0807 01/25/16 0814 01/26/16 0300 01/27/16 0340  WBC 8.6  --  8.9 11.8*  HGB 10.8* 11.9* 10.7* 8.9*  HCT 33.3* 35.0* 32.8* 27.3*  PLT 372  --  325 314    Coag's  Recent Labs Lab 01/25/16 0807 01/26/16 0300  APTT 32  --   INR 1.46 1.67*    Sepsis Markers No results for input(s): LATICACIDVEN,  PROCALCITON, O2SATVEN in the last 168 hours.  ABG  Recent Labs Lab 01/25/16 1240  PHART 7.439  PCO2ART 34.4*  PO2ART 418*    Liver Enzymes  Recent Labs Lab 01/25/16 0807  AST 18  ALT 12*  ALKPHOS 81  BILITOT 0.4  ALBUMIN 3.3*    Cardiac Enzymes No results for input(s): TROPONINI, PROBNP in the last 168 hours.  Glucose  Recent Labs Lab 01/26/16 0838 01/26/16 1605 01/27/16 0002 01/27/16 0825 01/27/16 0905 01/27/16 0938  GLUCAP 125* 103* 98 67 24* 44    Imaging Mr Brain Wo Contrast  01/27/2016  CLINICAL DATA:  82 year old female who presented with emergent large vessel occlusion at the left MCA bifurcation status post Neuro-intervention, clot retrieval. Initial encounter. EXAM: MRI HEAD WITHOUT CONTRAST TECHNIQUE: Multiplanar, multiecho pulse sequences of the brain and surrounding structures were obtained without intravenous contrast. COMPARISON:  Post intervention head CT 01/25/2016, and earlier. FINDINGS: A small volume of cortical restricted diffusion is noted along the left sylvian fissure including the posterior left insula (series 4, image 22), as well as in the more posterior left temporal lobe and inferior parietal lobe. There is minimal posterior left periventricular white matter restriction. Occasional punctate areas of restricted diffusion over both superior convexities (e.g. Series 4, image 41),  but no other confluent restricted diffusion. Major intracranial vascular flow voids are preserved. No acute intracranial hemorrhage identified. No midline shift, mass effect, evidence of mass lesion, ventriculomegaly, or extra-axial collection. Cervicomedullary junction and pituitary are within normal limits. Patchy bilateral cerebral white matter T2 and FLAIR hyperintensity. Small chronic cortically based infarct in the right parietal lobe. Deep gray matter nuclei, brainstem, and cerebellum are normal for age. Visible internal auditory structures appear normal. Negative  visualized cervical spine. Postoperative changes to the globes. Otherwise negative orbit and scalp soft tissues. Visualized bone marrow signal is within normal limits. IMPRESSION: 1. Only a small volume of left MCA territory infarction occurred, primarily along the left sylvian fissure. No hemorrhage or mass effect. 2. Occasional other punctate areas of restricted diffusion over both superior convexities, probably neuro-intervention related. Electronically Signed   By: Genevie Ann M.D.   On: 01/27/2016 07:14     STUDIES:  CT head 05/15 > no acute process. CTA head / neck 05/15 > left MCA infarct.  CULTURES: None.  ANTIBIOTICS: None.  SIGNIFICANT EVENTS: 05/15 > admitted with left CVA infarct, taken to neuro IR for revascularization. 5/16 > extubated.   LINES/TUBES: ETT 05/15 >  DISCUSSION: 82 y.o. F admitted 05/15 with acute left MCA infarct.  She was taken to IR where she was intubated and had complete revascularization.  She then returned to the ICU on teh vent and PCCM was called for vent management.  ASSESSMENT / PLAN:  NEUROLOGIC A:   Acute left MCA infarct - likely due to A.fib (anticoagulation stopped in April due to GI bleed); s/p IR revascularization 05/15. Hx TIA, T12 compression fx. P:   S/P MRI this am.  Neuro following, stroke workup per them. Would recommend that neuro discuss timing of resuming anticoagulation with GI.  CARDIOVASCULAR A:  Hx A.fib (not on anticoagulation due to GI bleed in April 2017 - previously on warfarin), mitral regurg, CAD, HTN. P:  Monitor hemodynamics. Likely needs to resume anticoagulation given acute CVA (likely due to A.fib) - would recommend that primary team discuss timing with GI. Cont diltizem  PULMONARY A: S/P VDRF - due to acute left MCA infarct. S/P extubation on 5/16 > doing well and protecting airway P:   Keep O2 sats > 92% Incentive spirometry.    RENAL A:   No acute issues. P:   Suggest d/c IVF once pt is able to  eat.    GASTROINTESTINAL A:   GERD. Nutrition. P: Continue outpatient PPI. Swallow evaln  HEMATOLOGIC / ONCOLOGIC A:   Hx breast CA - 2012; no documentation in Epic; though is on daily anastrozole. VTE Prophylaxis. P:  Continue outpatient anastrozole per tube. SCD's.  INFECTIOUS A:   No indication of infection. P:   Monitor clinically.  ENDOCRINE A:   No acute issues.  P:   SSI if glucose consistently > 180.   Family updated: Husband updated at bedside.  Interdisciplinary Family Meeting v Palliative Care Meeting:  Due by: 01/31/16.  Pt doing well post intubation. PCCM will sign off for now. Call back if with any questions.    Monica Becton, MD 01/27/2016, 11:11 AM [facility] Pager [phone] After 3 pm or if no answer, call [phone]

## 2016-01-27 NOTE — Evaluation (Signed)
Speech Language Pathology Evaluation Patient Details Name: Adrienne [name] MRN: [id number] DOB: Mar 29, 1928 Today's Date: 01/27/2016 Time: [id number] SLP Time Calculation (min) (ACUTE ONLY): 28 min  Problem List:  Patient Active Problem List   Diagnosis Date Noted  . Gait disturbance, post-stroke 01/27/2016  . Stroke (cerebrum) ([place]) 01/25/2016  . Fall   . Secondary hypertension, unspecified   . Acute respiratory failure with hypoxia ([place])   . Cerebral infarction due to thrombosis of left middle cerebral artery ([place])   . Malnutrition of moderate degree 01/09/2016  . GI bleed 01/06/2016  . Chronic atrial fibrillation ([place]) 06/10/2015  . Pulmonary hypertension, secondary ([place]) 06/10/2015  . Chronic anticoagulation 06/10/2015  . Hyperlipidemia 06/10/2015  . Essential hypertension 06/10/2015  . History of stroke 06/10/2015  . Coronary artery disease due to lipid rich plaque 06/10/2015  . Afib ([place])   . Elevated uric acid in blood   . Arthritis of ankle   . Pulmonary HTN ([place])    Past Medical History:  Past Medical History  Diagnosis Date  . Afib ([place])   . Elevated uric acid in blood   . Arthritis of ankle   . Pulmonary HTN ([place])   . Long-term (current) use of anticoagulants   . Moderate mitral regurgitation   . CAD (coronary artery disease)   . Edema   . Cancer [facility]) 2012    breast cancer-right  . Hypertension   . Urinary incontinence   . TIA (transient ischemic attack) 09/2011  . Diverticulosis   . Compression fracture of T12 vertebra [facility])    Past Surgical History:  Past Surgical History  Procedure Laterality Date  . Cardiac catheterization  2013  . Replacement total knee bilateral    . Gallbladder surgery    . Appendectomy    . Mastectomy Right   . Cataract extraction, bilateral    . Breast surgery  2012    Rt.mastectomy--[place], [place]  . Tonsillectomy and adenoidectomy      as a child  . Dilation and curettage of uterus      in her early 61's for DUB  .  Esophagogastroduodenoscopy Left 01/08/2016    Procedure: ESOPHAGOGASTRODUODENOSCOPY (EGD);  Surgeon: Arta Silence, MD;  Location: [facility];  Service: Endoscopy;  Laterality: Left;  . Radiology with anesthesia N/A 01/25/2016    Procedure: RADIOLOGY WITH ANESTHESIA;  Surgeon: Luanne Bras, MD;  Location: [facility];  Service: Radiology;  Laterality: N/A;   HPI:  Adrienne [name] is an 82 y.o. female patient who was brought into the emergency room with right sided weakness, aphasia, post fall. History provided by her husband. Patient is aphasic and unable to provide any history. The patient has been both wake up around 6:15 AM and patient was in her normal state of health, no weakness no speech problems at that time. Last seen normal by husband was at 7 AM   Assessment / Plan / Recommendation Clinical Impression  Pt is presenting with mild expressive aphasia and questionable mild apraxia. Pt was given in the mini-mental to evaluate language and cognition. Pt scored a 29/30. However, it was noticed she had a delayed verbal response. She also had difficulty finding the "right words at times", but when given ample time to respond,she was able to cue herself and self correct. She also had some phonemic substitutions. Due to patients level of awareness of deficits and abilty to self correct her prognosis is good. Pt would benefit from Speech and language therapy to address pt's aphasia.  SLP Assessment  Patient needs continued Speech Lanaguage Pathology Services    Follow Up Recommendations  Inpatient Rehab    Frequency and Duration min 3x week  2 weeks      SLP Evaluation Prior Functioning  Cognitive/Linguistic Baseline: Within functional limits Type of Home: House  Lives With: Spouse Available Help at Discharge: Family Vocation: Retired   Associate Professor  Overall Cognitive Status: Impaired/Different from baseline Arousal/Alertness: Awake/alert Orientation Level: Oriented to person;Oriented  to place;Oriented to time;Oriented to situation Attention: Alternating Alternating Attention: Appears intact Memory: Appears intact Awareness: Appears intact Problem Solving: Appears intact Executive Function: Reasoning;Sequencing;Organizing;Decision Making;Initiating;Self Monitoring;Self Correcting Reasoning: Appears intact Sequencing: Appears intact Organizing: Appears intact Decision Making: Appears intact Initiating: Appears intact Self Monitoring: Appears intact Self Correcting: Appears intact Safety/Judgment: Appears intact [employer] Scales of Cognitive Functioning: Purposeful/appropriate    Comprehension  Auditory Comprehension Overall Auditory Comprehension: Appears within functional limits for tasks assessed Yes/No Questions: Within Functional Limits Commands: Within Functional Limits Conversation: Complex Interfering Components: Processing speed EffectiveTechniques: Extra processing time;Visual/Gestural cues Visual Recognition/Discrimination Discrimination: Within Function Limits Reading Comprehension Reading Status: Within funtional limits    Expression Expression Primary Mode of Expression: Verbal Verbal Expression Overall Verbal Expression: Impaired Initiation: No impairment Level of Generative/Spontaneous Verbalization: Conversation Repetition: No impairment Naming: No impairment Pragmatics: No impairment Effective Techniques: Semantic cues;Phonemic cues Written Expression Dominant Hand: Right Written Expression: Within Functional Limits   Oral / Motor  Oral Motor/Sensory Function Overall Oral Motor/Sensory Function: Within functional limits Motor Speech Overall Motor Speech: Impaired Respiration: Within functional limits Phonation: Normal Resonance: Within functional limits Articulation: Within functional limitis Intelligibility: Intelligible Motor Planning: Impaired Level of Impairment: Sentence Motor Speech Errors: Aware Effective  Techniques: Slow rate;Over-articulate   GO          Functional Assessment Tool Used: Mini-mental Functional Limitations: Spoken language expressive Spoken Language Expression Current Status [phone]): At least 20 percent but less than 40 percent impaired, limited or restricted Spoken Language Expression Goal Status [phone]): At least 1 percent but less than 20 percent impaired, limited or restricted         [place], [place], CCC-SLP 01/27/2016 2:55 PM

## 2016-01-27 NOTE — Progress Notes (Signed)
Referring Physician(s): Dr Rosalin Hawking  Supervising Physician: Luanne Bras  Patient Status: In-pt  Chief Complaint:  CVA L MCA revascularization 5/15  Subjective:  Doing well Off vent Up in chair Moving all 4s Speaking coherently and appropriate answers Ambulating in room with help  Allergies: Amiodarone hcl; Compazine; Other; Thorazine; Valium; and Zometa  Medications: Prior to Admission medications   Medication Sig Start Date End Date Taking? Authorizing Provider  acetaminophen (TYLENOL) 500 MG tablet Take 500-1,000 mg by mouth every 12 (twelve) hours as needed (pain).    Yes Historical Provider, MD  anastrozole (ARIMIDEX) 1 MG tablet Take 1 mg by mouth daily.   Yes Historical Provider, MD  CALCIUM PO Take 1 tablet by mouth daily at 3 pm.   Yes Historical Provider, MD  Cholecalciferol (VITAMIN D PO) Take 1 tablet by mouth daily.    Yes Historical Provider, MD  diltiazem (CARDIZEM) 60 MG tablet Take 1 tablet (60 mg total) by mouth 3 (three) times daily. Patient taking differently: Take 60 mg by mouth 3 (three) times daily.  06/10/15  Yes Jerline Pain, MD  HYDROcodone-acetaminophen (NORCO/VICODIN) 5-325 MG tablet Take 1 tablet by mouth every 8 (eight) hours as needed for moderate pain.   Yes Historical Provider, MD  metoprolol (LOPRESSOR) 100 MG tablet Take 1 tablet (100 mg total) by mouth 2 (two) times daily. 06/10/15  Yes Jerline Pain, MD  omeprazole (PRILOSEC) 40 MG capsule Take 40 mg by mouth daily.    Yes Historical Provider, MD  trimethoprim (TRIMPEX) 100 MG tablet Take 100 mg by mouth daily.  11/10/15  Yes Historical Provider, MD  warfarin (COUMADIN) 4 MG tablet Take 4 mg by mouth daily at 3 pm. Name Brand   Yes Historical Provider, MD     Vital Signs: BP 169/104 mmHg  Pulse 86  Temp(Src) 97.7 F (36.5 C) (Oral)  Resp 19  Ht 5\' 7"  (1.702 m)  Wt 146 lb 2.6 oz (66.3 kg)  BMI 22.89 kg/m2  SpO2 99%  LMP 09/12/1973 (Approximate)  Physical Exam    Constitutional: She is oriented to person, place, and time. She appears well-nourished.  HENT:  Face symmetrical Tongue medline Puffs cheeks  Eyes: EOM are normal.  Musculoskeletal: Normal range of motion.  Good strength B Moves all 4s  Neurological: She is alert and oriented to person, place, and time.  Skin: Skin is warm and dry.  Rt groin NT no bleeding No hematoma  Psychiatric: She has a normal mood and affect. Her behavior is normal.  Nursing note and vitals reviewed.   Imaging: Ct Angio Head W/cm &/or Wo Cm  01/25/2016  CLINICAL DATA:  82 year old female code stroke, last seen normal a 0630 hours. Left MCA large vessel infarcts suspected clinically. Initial encounter. EXAM: CT ANGIOGRAPHY HEAD AND NECK CT BRAIN PERFUSION TECHNIQUE: Multidetector CT imaging of the head and neck was performed using the standard protocol during bolus administration of intravenous contrast. Multiplanar CT image reconstructions and MIPs were obtained to evaluate the vascular anatomy. Carotid stenosis measurements (when applicable) are obtained utilizing NASCET criteria, using the distal internal carotid diameter as the denominator. CONTRAST:  100 mL Isovue 370. COMPARISON:  Noncontrast head CT 0808 hours today. FINDINGS: CT BRAIN PERFUSION Source images reveal evidence of a posterior division left MCA M2 occlusion. Perfusion maps demonstrate decreased cerebral blood flow throughout the posterior and middle left MCA territory with associated abnormal mean transit time and time to peak (more abnormal in the posterior than  the middle left MCA divisions). At the same time the cerebral blood volume remains symmetric to the right hemisphere. CTA NECK Skeleton: Cervical spine and left TMJ degeneration. Cervical spine findings today reported separately. No acute osseous abnormality identified. Paranasal sinuses and mastoids are clear. Other neck: Apical pulmonary septal thickening. Mild mosaic ground-glass opacity. No  superior mediastinal lymphadenopathy. Small bilateral benign-appearing thyroid nodules. Larynx, pharynx, parapharyngeal spaces, retropharyngeal space, sublingual space, submandibular glands (right submandibular gland ptosis), and bilateral parotid glands are within normal limits. No cervical lymphadenopathy. Aortic arch: 3 vessel arch configuration. Mild to moderate calcified arch atherosclerosis. No great vessel origin stenosis. Right carotid system: Tortuous proximal right CCA. Intermittent mild right CCA calcified plaque without stenosis. Confluent calcified plaque at the right ICA origin and bulb without stenosis. Negative cervical right ICA otherwise. Left carotid system: Minimal calcified plaque at the left CCA origin without stenosis. Moderately tortuous left CCA. Confluent calcified plaque at the left carotid bifurcation, left ICA origin, and bulb. Subsequent stenosis of 60 % with respect to the distal vessel. Distal to the bulb the cervical left ICA is tortuous but otherwise negative. Vertebral arteries:No proximal subclavian artery stenosis despite soft and calcified plaque worse on the left. No definite vertebral artery stenosis. Tortuous left V1 segment. Fairly codominant vertebral arteries are normal to the skullbase. CTA HEAD Posterior circulation: Mildly dominant distal right vertebral artery. The left is diminutive beyond the PICA origin. Right PICA origin also is normal. No basilar artery stenosis. Normal SCA and left PCA origins. Fetal type right PCA origin. Left PCA branches are within normal limits. The inferior right PCA P3 division is diminutive or stenotic. The left posterior communicating artery is diminutive or absent. Anterior circulation: Patent left ICA siphon with moderate calcified plaque. Up to moderate supraclinoid left ICA stenosis related to calcified plaque (series 508, image 244). The left ICA terminus and left MCA origin remain patent. The left MCA M1 segment is patent. There is  thrombus at the left MCA bifurcation and occlusion of the left MCA posterior division. There is also occlusion of the middle left MCA division (series 509, image 112). There is occlusion at the origin of the anterior division (series 509, image 111) with preserved distal flow. Patent right ICA siphon with moderate calcified atherosclerosis. Mild to moderate supra clinoid right ICA stenosis. Normal ophthalmic and right posterior communicating artery origins. Normal right ICA terminus. Normal bilateral ACA and right MCA origins. Anterior communicating artery and proximal ACA branches are within normal limits. There is mild to moderate distal ACA irregularity. Right MCA M1 segment, bifurcation, and proximal right MCA branches are within normal limits. There is mild to moderate distal right MCA branch irregularity. Venous sinuses: Patent. Anatomic variants: Dominant distal right vertebral artery. Fetal type right PCA origin. IMPRESSION: 1. Positive for Emergent Large Vessel Occlusion at The left MCA bifurcation. Abnormal left MCA perfusion with noncontrast head CT and CT Brain Perfusion parameters favorable for clot retrieval. This was discussed in person with Dr. Audria Nine at 0845 hours. The patient is undergoing intra arterial Neurointervention at this time. 2. Superimposed atherosclerotic stenosis at the left ICA origin (60-65%) and left supraclinoid ICA siphon (moderate) related to calcified plaque. 3. Superimposed mild to moderate second and third order branch intracranial atherosclerosis including in the bilateral ACA, right MCA, and left PCA branches. 4. Right carotid bifurcation calcified plaque without stenosis. Electronically Signed   By: Genevie Ann M.D.   On: 01/25/2016 10:04   Ct Head Wo Contrast  01/25/2016  CLINICAL DATA:  Stroke. Left MCA occlusion status post endovascular revascularization. EXAM: CT HEAD WITHOUT CONTRAST TECHNIQUE: Contiguous axial images were obtained from the base of the skull  through the vertex without intravenous contrast. COMPARISON:  Head CT and CTA earlier today FINDINGS: Residual intravascular contrast is present related to recent angiogram. There is subtly decreased gray-white differentiation with slight early sulcal effacement involving the left temporoparietal region in the posterior MCA distribution consistent with early infarct. Minimal contrast staining and some retained intravascular contrast are noted in this region. No parenchymal hematoma is seen. There is no midline shift or extra-axial fluid collection. Mild cerebral atrophy and advanced chronic small vessel ischemic changes are again noted as well as a chronic right parietal cortical infarct. There is a dilated perivascular space versus chronic lacunar infarct in the inferior left putamen. Prior bilateral cataract extraction is noted. The visualized paranasal sinuses and mastoid air cells are clear. No acute osseous abnormality is identified. Calcified atherosclerosis is noted at the skullbase, and there are left TMJ joint degenerative changes. IMPRESSION: Early changes of acute posterior left MCA infarct. No parenchymal hematoma. Electronically Signed   By: Logan Bores M.D.   On: 01/25/2016 12:16   Ct Head Wo Contrast  01/25/2016  ADDENDUM REPORT: 01/25/2016 09:01 ADDENDUM: Left hemisphere ASPECTS score = 9; small area of cortical hypodensity in the left M2 as seen on series 21, image 14. I gave Dr. Silverio Decamp a verbal preliminary ASPECTS score of "10" at 0845 hours today. [place] Stroke Program Early CT Score Normal score = 10 Electronically Signed   By: Genevie Ann M.D.   On: 01/25/2016 09:01  01/25/2016  CLINICAL DATA:  Right-sided weakness and neglect EXAM: CT HEAD WITHOUT CONTRAST TECHNIQUE: Contiguous axial images were obtained from the base of the skull through the vertex without intravenous contrast. COMPARISON:  None. FINDINGS: There is mild diffuse atrophy. There is no intracranial mass, hemorrhage,  extra-axial fluid collection, or midline shift. There is evidence of a prior focal infarct at the level of the gray- white junction of the anterior right parietal lobe. There is patchy small vessel disease throughout the centra semiovale bilaterally. There is evidence of a prior infarct in the lateral left lentiform nucleus. No acute appearing infarct is evident. Middle cerebral artery show symmetric attenuation bilaterally. The bony calvarium appears intact. The mastoid air cells are clear. No intraorbital lesions are evident. IMPRESSION: Atrophy with small vessel disease in the periventricular white matter. Prior infarcts as noted above. No acute appearing infarct is evident. No hemorrhage or mass effect. Critical Value/emergent results were called by telephone at the time of interpretation on 01/25/2016 at 8:25 am to Dr. Silverio Decamp, neurology, who verbally acknowledged these results. Electronically Signed: By: Lowella Grip III M.D. On: 01/25/2016 08:25   Ct Angio Neck W/cm &/or Wo/cm  01/25/2016  CLINICAL DATA:  82 year old female code stroke, last seen normal a 0630 hours. Left MCA large vessel infarcts suspected clinically. Initial encounter. EXAM: CT ANGIOGRAPHY HEAD AND NECK CT BRAIN PERFUSION TECHNIQUE: Multidetector CT imaging of the head and neck was performed using the standard protocol during bolus administration of intravenous contrast. Multiplanar CT image reconstructions and MIPs were obtained to evaluate the vascular anatomy. Carotid stenosis measurements (when applicable) are obtained utilizing NASCET criteria, using the distal internal carotid diameter as the denominator. CONTRAST:  100 mL Isovue 370. COMPARISON:  Noncontrast head CT 0808 hours today. FINDINGS: CT BRAIN PERFUSION Source images reveal evidence of a posterior division left MCA M2 occlusion. Perfusion  maps demonstrate decreased cerebral blood flow throughout the posterior and middle left MCA territory with associated abnormal  mean transit time and time to peak (more abnormal in the posterior than the middle left MCA divisions). At the same time the cerebral blood volume remains symmetric to the right hemisphere. CTA NECK Skeleton: Cervical spine and left TMJ degeneration. Cervical spine findings today reported separately. No acute osseous abnormality identified. Paranasal sinuses and mastoids are clear. Other neck: Apical pulmonary septal thickening. Mild mosaic ground-glass opacity. No superior mediastinal lymphadenopathy. Small bilateral benign-appearing thyroid nodules. Larynx, pharynx, parapharyngeal spaces, retropharyngeal space, sublingual space, submandibular glands (right submandibular gland ptosis), and bilateral parotid glands are within normal limits. No cervical lymphadenopathy. Aortic arch: 3 vessel arch configuration. Mild to moderate calcified arch atherosclerosis. No great vessel origin stenosis. Right carotid system: Tortuous proximal right CCA. Intermittent mild right CCA calcified plaque without stenosis. Confluent calcified plaque at the right ICA origin and bulb without stenosis. Negative cervical right ICA otherwise. Left carotid system: Minimal calcified plaque at the left CCA origin without stenosis. Moderately tortuous left CCA. Confluent calcified plaque at the left carotid bifurcation, left ICA origin, and bulb. Subsequent stenosis of 60 % with respect to the distal vessel. Distal to the bulb the cervical left ICA is tortuous but otherwise negative. Vertebral arteries:No proximal subclavian artery stenosis despite soft and calcified plaque worse on the left. No definite vertebral artery stenosis. Tortuous left V1 segment. Fairly codominant vertebral arteries are normal to the skullbase. CTA HEAD Posterior circulation: Mildly dominant distal right vertebral artery. The left is diminutive beyond the PICA origin. Right PICA origin also is normal. No basilar artery stenosis. Normal SCA and left PCA origins. Fetal  type right PCA origin. Left PCA branches are within normal limits. The inferior right PCA P3 division is diminutive or stenotic. The left posterior communicating artery is diminutive or absent. Anterior circulation: Patent left ICA siphon with moderate calcified plaque. Up to moderate supraclinoid left ICA stenosis related to calcified plaque (series 508, image 244). The left ICA terminus and left MCA origin remain patent. The left MCA M1 segment is patent. There is thrombus at the left MCA bifurcation and occlusion of the left MCA posterior division. There is also occlusion of the middle left MCA division (series 509, image 112). There is occlusion at the origin of the anterior division (series 509, image 111) with preserved distal flow. Patent right ICA siphon with moderate calcified atherosclerosis. Mild to moderate supra clinoid right ICA stenosis. Normal ophthalmic and right posterior communicating artery origins. Normal right ICA terminus. Normal bilateral ACA and right MCA origins. Anterior communicating artery and proximal ACA branches are within normal limits. There is mild to moderate distal ACA irregularity. Right MCA M1 segment, bifurcation, and proximal right MCA branches are within normal limits. There is mild to moderate distal right MCA branch irregularity. Venous sinuses: Patent. Anatomic variants: Dominant distal right vertebral artery. Fetal type right PCA origin. IMPRESSION: 1. Positive for Emergent Large Vessel Occlusion at The left MCA bifurcation. Abnormal left MCA perfusion with noncontrast head CT and CT Brain Perfusion parameters favorable for clot retrieval. This was discussed in person with Dr. Audria Nine at 0845 hours. The patient is undergoing intra arterial Neurointervention at this time. 2. Superimposed atherosclerotic stenosis at the left ICA origin (60-65%) and left supraclinoid ICA siphon (moderate) related to calcified plaque. 3. Superimposed mild to moderate second and third  order branch intracranial atherosclerosis including in the bilateral ACA, right MCA, and left PCA  branches. 4. Right carotid bifurcation calcified plaque without stenosis. Electronically Signed   By: Genevie Ann M.D.   On: 01/25/2016 10:04   Mr Brain Wo Contrast  01/27/2016  CLINICAL DATA:  82 year old female who presented with emergent large vessel occlusion at the left MCA bifurcation status post Neuro-intervention, clot retrieval. Initial encounter. EXAM: MRI HEAD WITHOUT CONTRAST TECHNIQUE: Multiplanar, multiecho pulse sequences of the brain and surrounding structures were obtained without intravenous contrast. COMPARISON:  Post intervention head CT 01/25/2016, and earlier. FINDINGS: A small volume of cortical restricted diffusion is noted along the left sylvian fissure including the posterior left insula (series 4, image 22), as well as in the more posterior left temporal lobe and inferior parietal lobe. There is minimal posterior left periventricular white matter restriction. Occasional punctate areas of restricted diffusion over both superior convexities (e.g. Series 4, image 41), but no other confluent restricted diffusion. Major intracranial vascular flow voids are preserved. No acute intracranial hemorrhage identified. No midline shift, mass effect, evidence of mass lesion, ventriculomegaly, or extra-axial collection. Cervicomedullary junction and pituitary are within normal limits. Patchy bilateral cerebral white matter T2 and FLAIR hyperintensity. Small chronic cortically based infarct in the right parietal lobe. Deep gray matter nuclei, brainstem, and cerebellum are normal for age. Visible internal auditory structures appear normal. Negative visualized cervical spine. Postoperative changes to the globes. Otherwise negative orbit and scalp soft tissues. Visualized bone marrow signal is within normal limits. IMPRESSION: 1. Only a small volume of left MCA territory infarction occurred, primarily along the  left sylvian fissure. No hemorrhage or mass effect. 2. Occasional other punctate areas of restricted diffusion over both superior convexities, probably neuro-intervention related. Electronically Signed   By: Genevie Ann M.D.   On: 01/27/2016 07:14   Ct C-spine No Charge  01/25/2016  CLINICAL DATA:  Recent left middle cerebral artery infarct, recent fall EXAM: CT CERVICAL SPINE WITHOUT CONTRAST TECHNIQUE: Multidetector CT imaging of the cervical spine was performed without intravenous contrast. Multiplanar CT image reconstructions were also generated. COMPARISON:  None. FINDINGS: Seven cervical segments are well visualized. Vertebral body height is well maintained. Mild facet hypertrophic changes are noted as well as endplate osteophytes with disc space narrowing from C3-C7. No gross soft tissue abnormality is noted. No acute fracture or acute facet abnormality is seen. IMPRESSION: Multilevel degenerative change without acute abnormality. Electronically Signed   By: Inez Catalina M.D.   On: 01/25/2016 12:58   Ct Cerebral Perfusion W/cm  01/25/2016  CLINICAL DATA:  82 year old female code stroke, last seen normal a 0630 hours. Left MCA large vessel infarcts suspected clinically. Initial encounter. EXAM: CT ANGIOGRAPHY HEAD AND NECK CT BRAIN PERFUSION TECHNIQUE: Multidetector CT imaging of the head and neck was performed using the standard protocol during bolus administration of intravenous contrast. Multiplanar CT image reconstructions and MIPs were obtained to evaluate the vascular anatomy. Carotid stenosis measurements (when applicable) are obtained utilizing NASCET criteria, using the distal internal carotid diameter as the denominator. CONTRAST:  100 mL Isovue 370. COMPARISON:  Noncontrast head CT 0808 hours today. FINDINGS: CT BRAIN PERFUSION Source images reveal evidence of a posterior division left MCA M2 occlusion. Perfusion maps demonstrate decreased cerebral blood flow throughout the posterior and middle  left MCA territory with associated abnormal mean transit time and time to peak (more abnormal in the posterior than the middle left MCA divisions). At the same time the cerebral blood volume remains symmetric to the right hemisphere. CTA NECK Skeleton: Cervical spine and left TMJ  degeneration. Cervical spine findings today reported separately. No acute osseous abnormality identified. Paranasal sinuses and mastoids are clear. Other neck: Apical pulmonary septal thickening. Mild mosaic ground-glass opacity. No superior mediastinal lymphadenopathy. Small bilateral benign-appearing thyroid nodules. Larynx, pharynx, parapharyngeal spaces, retropharyngeal space, sublingual space, submandibular glands (right submandibular gland ptosis), and bilateral parotid glands are within normal limits. No cervical lymphadenopathy. Aortic arch: 3 vessel arch configuration. Mild to moderate calcified arch atherosclerosis. No great vessel origin stenosis. Right carotid system: Tortuous proximal right CCA. Intermittent mild right CCA calcified plaque without stenosis. Confluent calcified plaque at the right ICA origin and bulb without stenosis. Negative cervical right ICA otherwise. Left carotid system: Minimal calcified plaque at the left CCA origin without stenosis. Moderately tortuous left CCA. Confluent calcified plaque at the left carotid bifurcation, left ICA origin, and bulb. Subsequent stenosis of 60 % with respect to the distal vessel. Distal to the bulb the cervical left ICA is tortuous but otherwise negative. Vertebral arteries:No proximal subclavian artery stenosis despite soft and calcified plaque worse on the left. No definite vertebral artery stenosis. Tortuous left V1 segment. Fairly codominant vertebral arteries are normal to the skullbase. CTA HEAD Posterior circulation: Mildly dominant distal right vertebral artery. The left is diminutive beyond the PICA origin. Right PICA origin also is normal. No basilar artery  stenosis. Normal SCA and left PCA origins. Fetal type right PCA origin. Left PCA branches are within normal limits. The inferior right PCA P3 division is diminutive or stenotic. The left posterior communicating artery is diminutive or absent. Anterior circulation: Patent left ICA siphon with moderate calcified plaque. Up to moderate supraclinoid left ICA stenosis related to calcified plaque (series 508, image 244). The left ICA terminus and left MCA origin remain patent. The left MCA M1 segment is patent. There is thrombus at the left MCA bifurcation and occlusion of the left MCA posterior division. There is also occlusion of the middle left MCA division (series 509, image 112). There is occlusion at the origin of the anterior division (series 509, image 111) with preserved distal flow. Patent right ICA siphon with moderate calcified atherosclerosis. Mild to moderate supra clinoid right ICA stenosis. Normal ophthalmic and right posterior communicating artery origins. Normal right ICA terminus. Normal bilateral ACA and right MCA origins. Anterior communicating artery and proximal ACA branches are within normal limits. There is mild to moderate distal ACA irregularity. Right MCA M1 segment, bifurcation, and proximal right MCA branches are within normal limits. There is mild to moderate distal right MCA branch irregularity. Venous sinuses: Patent. Anatomic variants: Dominant distal right vertebral artery. Fetal type right PCA origin. IMPRESSION: 1. Positive for Emergent Large Vessel Occlusion at The left MCA bifurcation. Abnormal left MCA perfusion with noncontrast head CT and CT Brain Perfusion parameters favorable for clot retrieval. This was discussed in person with Dr. Audria Nine at 0845 hours. The patient is undergoing intra arterial Neurointervention at this time. 2. Superimposed atherosclerotic stenosis at the left ICA origin (60-65%) and left supraclinoid ICA siphon (moderate) related to calcified plaque. 3.  Superimposed mild to moderate second and third order branch intracranial atherosclerosis including in the bilateral ACA, right MCA, and left PCA branches. 4. Right carotid bifurcation calcified plaque without stenosis. Electronically Signed   By: Genevie Ann M.D.   On: 01/25/2016 10:04   Dg Chest Port 1 View  01/26/2016  CLINICAL DATA:  Respiratory failure EXAM: PORTABLE CHEST 1 VIEW COMPARISON:  None. FINDINGS: Cardiomediastinal silhouette is stable. No acute infiltrate or pleural effusion. No  pulmonary edema. NG tube in place. Endotracheal tube in place with tip 4.3 cm above the carina. Atherosclerotic calcifications of thoracic aorta. Probable chronic mild interstitial prominence. No pneumothorax. IMPRESSION: No active disease. Probable chronic mild interstitial prominence. Endotracheal and NG tube in place. No pneumothorax. Electronically Signed   By: Lahoma Crocker M.D.   On: 01/26/2016 11:18    Labs:  CBC:  Recent Labs  01/10/16 0517 01/25/16 0807 01/25/16 0814 01/26/16 0300 01/27/16 0340  WBC 9.5 8.6  --  8.9 11.8*  HGB 8.9* 10.8* 11.9* 10.7* 8.9*  HCT 26.4* 33.3* 35.0* 32.8* 27.3*  PLT 213 372  --  325 314    COAGS:  Recent Labs  01/07/16 0458 01/08/16 0655 01/25/16 0807 01/26/16 0300  INR 1.97* 1.25 1.46 1.67*  APTT  --   --  32  --     BMP:  Recent Labs  01/10/16 0517 01/25/16 0807 01/25/16 0814 01/26/16 0300 01/27/16 0340  NA 140 138 139 142 143  K 3.9 3.9 3.8 3.6 3.8  CL 110 103 102 107 112*  CO2 23 26  --  23 22  GLUCOSE 117* 120* 115* 141* 90  BUN 9 10 11 7 9   CALCIUM 8.3* 9.1  --  8.7* 8.3*  CREATININE 0.69 1.03* 1.00 0.90 0.75  GFRNONAA >60 48*  --  56* >60  GFRAA >60 55*  --  >60 >60    LIVER FUNCTION TESTS:  Recent Labs  01/06/16 1243 01/25/16 0807  BILITOT 0.4 0.4  AST 17 18  ALT 17 12*  ALKPHOS 85 81  PROT 7.0 6.4*  ALBUMIN 4.0 3.3*    Assessment and Plan:  CVA L MCA revascularization 5/15 Doing well For probable IP  rehab  Electronically Signed: Verdia Bolt A 01/27/2016, 11:21 AM   I spent a total of 15 Minutes at the the patient's bedside AND on the patient's hospital floor or unit, greater than 50% of which was counseling/coordinating care for L MCA revascularization

## 2016-01-27 NOTE — Progress Notes (Signed)
Rehab Admissions Coordinator Note:  Patient was screened by Cleatrice Burke for appropriateness for an Inpatient Acute Rehab Consult per PT recommendation.   At this time, we are recommending Inpatient Rehab consult.  Cleatrice Burke 01/27/2016, 10:58 AM  I can be reached at [phone].

## 2016-01-27 NOTE — Evaluation (Signed)
Occupational Therapy Evaluation Patient Details Name: Adrienne [name] MRN: [id number] DOB: 02-08-1928 Today's Date: 01/27/2016    History of Present Illness 82 y.o. female admitted to [facility] on 01/25/16 for right sided weakness, aphasia, and fall.  Pt dx with right sided weakness, aphasia, and fall.  Pt dx with left MCA infarct.  Pt underwent clot retrevial in IR  Intubated post proceedure 01/25/16-01/26/16.  Pt with significant PMHx of GIB, A-fib, arthritis of the ankle, pulmonary HTN, CAD, mild mitral regurg, CAD, CA, HTN, TIA, compression fx T12, Bil TKA, and R mastectomy.   Clinical Impression   PT admitted with see above. Pt currently with functional limitiations due to the deficits listed below (see OT problem list). Pt demonstrates diplopia with far gaze and awareness. Pt states "do you want me to tell you the real you or her next to you" pt seeing horizontal diplopia.  Pt will benefit from skilled OT to increase their independence and safety with adls and balance to allow discharge CIR.     Follow Up Recommendations  CIR    Equipment Recommendations  Other (comment) (defer)    Recommendations for Other Services Rehab consult     Precautions / Restrictions Precautions Precautions: Fall Precaution Comments: pt is unsteady on her feet, family reports this was the case PTA, but more now      Mobility Bed Mobility Overal bed mobility: Needs Assistance Bed Mobility: Supine to Sit     Supine to sit: Min assist     General bed mobility comments: Min assist to help pt steady herself to get to EOB.   Transfers Overall transfer level: Needs assistance Equipment used: 2 person hand held assist Transfers: Sit to/from Stand Sit to Stand: +2 safety/equipment;Min assist;Mod assist         General transfer comment: Up to mod assist, especially once fatigued to help safely lower to the chair and ensure she was lined up with sitting surface before sitting down, pt tried to sit very  prematurely.  Min assist to get to standing wtih heavy reliance on bil LEs resting against bed for balance.      Balance Overall balance assessment: Needs assistance   Sitting balance-Leahy Scale: Fair       Standing balance-Leahy Scale: Poor                              ADL Overall ADL's : Needs assistance/impaired     Grooming: Wash/dry hands;Minimal assistance;Bed level   Upper Body Bathing: Minimal assitance;Bed level               Toilet Transfer: +2 for physical assistance;Moderate assistance           Functional mobility during ADLs: Moderate assistance;+2 for physical assistance General ADL Comments: Pt reports dizziness and need to sit. BP stable and monitored. pt with visual deficits affecting all adls see below     Vision Vision Assessment?: Yes Eye Alignment: Impaired (comment) Ocular Range of Motion: Impaired-to be further tested in functional context Tracking/Visual Pursuits: Impaired - to be further tested in functional context Convergence: Impaired (comment) Diplopia Assessment: Objects split side to side;Present in far gaze;Only with left gaze Additional Comments: Pt is R eye dominant. pt demonstrates rotational nystagmus with visual tracking. pt also decreased R eye lateral tracking. pt when looking L -- R eye drifts to upper right L quadrants and dysconjugate gaze clearly seen.    Perception  Praxis      Pertinent Vitals/Pain Pain Assessment: No/denies pain Pain Score: 0-No pain     Hand Dominance Right   Extremity/Trunk Assessment Upper Extremity Assessment Upper Extremity Assessment: RUE deficits/detail RUE Deficits / Details: R dominant and decr senstation. Decr awareness to R hand deficits RUE Coordination: decreased fine motor;decreased gross motor   Lower Extremity Assessment Lower Extremity Assessment: Defer to PT evaluation   Cervical / Trunk Assessment Cervical / Trunk Assessment: Normal   Communication  Communication Communication: Expressive difficulties (word finding)   Cognition Arousal/Alertness: Awake/alert Behavior During Therapy: WFL for tasks assessed/performed Overall Cognitive Status: Impaired/Different from baseline Area of Impairment: Orientation Orientation Level: Time                 General Comments       Exercises       Shoulder Instructions      Home Living Family/patient expects to be discharged to:: Private residence Living Arrangements: Spouse/significant other Available Help at Discharge: Family Type of Home: House Home Access: Stairs to enter Technical brewer of Steps: 1 stoop Entrance Stairs-Rails: None Home Layout: One level     Bathroom Shower/Tub: Occupational psychologist: Standard     Home Equipment: Civil engineer, contracting - built in;Hand held Veterinary surgeon - single point   Additional Comments: pt with incr balance deficits very recently  Lives With: Spouse    Prior Functioning/Environment Level of Independence: Needs assistance  Gait / Transfers Assistance Needed: per family, husband would carry her purse and provide his arm for balance during community gait.  ADL's / Homemaking Assistance Needed: independent        OT Diagnosis: Generalized weakness   OT Problem List: Decreased strength;Decreased activity tolerance;Impaired balance (sitting and/or standing);Impaired vision/perception;Decreased safety awareness;Decreased knowledge of use of DME or AE;Decreased knowledge of precautions   OT Treatment/Interventions: Self-care/ADL training;Therapeutic exercise;Neuromuscular education;DME and/or AE instruction;Therapeutic activities;Cognitive remediation/compensation;Visual/perceptual remediation/compensation;Patient/family education;Balance training    OT Goals(Current goals can be found in the care plan section) Acute Rehab OT Goals Patient Stated Goal: to go home OT Goal Formulation: With patient Time For Goal  Achievement: 02/10/16 Potential to Achieve Goals: Good  OT Frequency: Min 2X/week   Barriers to D/C:            Co-evaluation PT/OT/SLP Co-Evaluation/Treatment: Yes Reason for Co-Treatment: For patient/therapist safety   OT goals addressed during session: ADL's and self-care;Strengthening/ROM      End of Session Nurse Communication: Mobility status;Precautions  Activity Tolerance: Patient tolerated treatment well Patient left: in chair;with call bell/phone within reach   Time: [id number] OT Time Calculation (min): 44 min Charges:  OT Evaluation $OT Eval Moderate Complexity: 1 Procedure OT Treatments $Therapeutic Activity: 8-22 mins G-Codes:    Parke Poisson B 02-17-2016, 2:50 PM  Jeri Modena   OTR/L Pager: [phone] Office: [phone] .

## 2016-01-27 NOTE — Progress Notes (Signed)
STROKE TEAM PROGRESS NOTE   HISTORY OF PRESENT ILLNESS Adrienne [name] is an 82 y.o. female patient who was brought into the emergency room with right sided weakness, aphasia, post fall. History provided by her husband. Patient is aphasic and unable to provide any history. The patient woke up around 6:15 AM and patient was in her normal state of health, no weakness no speech problems at that time. Last seen normal by husband at 7 AM 01/25/2016.  The patient went to take shower and she appeared to fall in the bathroom and got some glass on her feet during the fall. When he checked on her after the fall, she was noted to have right-sided weakness, unable to respond to commands. Hence she was brought into the ER for further evaluation and stroke code was called by the EMS.  Patient has a history of atrial fibrillation, on Coumadin. She was admitted for GI bleed evaluation with a drop in hematocrit 3 weeks ago and after completion of extensive GI workup, Coumadin was restarted 1 week ago. Her INR on arrival was 1.5. NIHSS 23. mRS 0.  Patient was not administered IV t-PA secondary to  recent GI bleed less than 3 weeks ago, on coumadin. She was admitted to the neuro ICU for further evaluation and treatment.  SUBJECTIVE (INTERVAL HISTORY) Her husband and daughter are at the bedside.  Pt wide awake and alert, extubated, following commands, conversant.   OBJECTIVE Temp:  [97.5 F (36.4 C)-97.7 F (36.5 C)] 97.6 F (36.4 C) (05/17 1200) Pulse Rate:  [51-104] 89 (05/17 1300) Cardiac Rhythm:  [-] Atrial fibrillation (05/17 0800) Resp:  [12-36] 24 (05/17 1300) BP: (138-192)/(39-129) 170/80 mmHg (05/17 1300) SpO2:  [94 %-100 %] 99 % (05/17 1300)  CBC:  Recent Labs Lab 01/25/16 0807  01/26/16 0300 01/27/16 0340  WBC 8.6  --  8.9 11.8*  NEUTROABS 5.2  --  8.0*  --   HGB 10.8*  < > 10.7* 8.9*  HCT 33.3*  < > 32.8* 27.3*  MCV 89.5  --  90.4 90.1  PLT 372  --  325 314  < > = values in this interval  not displayed.  Basic Metabolic Panel:   Recent Labs Lab 01/26/16 0300 01/27/16 0340  NA 142 143  K 3.6 3.8  CL 107 112*  CO2 23 22  GLUCOSE 141* 90  BUN 7 9  CREATININE 0.90 0.75  CALCIUM 8.7* 8.3*  MG 1.9  --   PHOS 4.3  --     Lipid Panel:     Component Value Date/Time   CHOL 143 01/26/2016 0300   TRIG 101 01/26/2016 0300   HDL 43 01/26/2016 0300   CHOLHDL 3.3 01/26/2016 0300   VLDL 20 01/26/2016 0300   LDLCALC 80 01/26/2016 0300   HgbA1c:  Lab Results  Component Value Date   HGBA1C 5.6 01/26/2016   Urine Drug Screen:     Component Value Date/Time   LABOPIA NONE DETECTED 01/25/2016 1855   COCAINSCRNUR NONE DETECTED 01/25/2016 1855   LABBENZ NONE DETECTED 01/25/2016 1855   AMPHETMU NONE DETECTED 01/25/2016 1855   THCU NONE DETECTED 01/25/2016 1855   LABBARB NONE DETECTED 01/25/2016 1855     IMAGING I have personally reviewed the radiological images below and agree with the radiology interpretations.  Ct Head Wo Contrast 01/25/2016  Early changes of acute posterior left MCA infarct. No parenchymal hematoma. 01/25/2016  ADDENDUM Left hemisphere ASPECTS score = 9; small area of cortical hypodensity in the left  M2 as seen on series 21, image 14.had given verbal preliminary ASPECTS score of "10" at 0845 hours today. Hall M.D. 01/25/2016   Atrophy with small vessel disease in the periventricular white matter. Prior infarcts as noted above. No acute appearing infarct is evident. No hemorrhage or mass effect.   Ct Angio Head & Neck W/cm &/or Wo/cm 01/25/2016  1. Positive for Emergent Large Vessel Occlusion at The left MCA bifurcation. Abnormal left MCA perfusion with noncontrast head CT and CT Brain Perfusion parameters favorable for clot retrieval. 2. Superimposed atherosclerotic stenosis at the left ICA origin (60-65%) and left supraclinoid ICA siphon (moderate) related to calcified plaque. 3. Superimposed mild to moderate second and third order branch intracranial  atherosclerosis including in the bilateral ACA, right MCA, and left PCA branches. 4. Right carotid bifurcation calcified plaque without stenosis.   Ct C-spine No Charge 01/25/2016  Multilevel degenerative change without acute abnormality.   Ct Cerebral Perfusion W/cm 01/25/2016   1. Positive for Emergent Large Vessel Occlusion at The left MCA bifurcation. Abnormal left MCA perfusion with noncontrast head CT and CT Brain Perfusion parameters favorable for clot retrieval.   Cerebral angiogram 01/25/2016 bilateral common carotid arteriograms followed by complete revascularization of Occluded Lt MCA With x 1 pass with Solitaire 4 mmx 40 mm Retrieval device and 4 mg of superselective IA integrelin with TICI 3 revascularization.  MRI Brain Wo Contrast 01/27/2016 Only a small volume of left MCA territory infarction occurred, primarily along the left sylvian fissure. No hemorrhage or mass effect. 2. Occasional other punctate areas of restricted diffusion over both superior convexities, probably neuro-intervention related.   TTE - - Left ventricle: The cavity size was normal. Systolic function was  normal. The estimated ejection fraction was in the range of 60%  to 65%. Wall motion was normal; there were no regional wall  motion abnormalities. The study is not technically sufficient to  allow evaluation of LV diastolic function. - Aortic valve: Valve mobility was mildly restricted. Transvalvular  velocity was within the normal range. There was no stenosis.  There was trivial regurgitation. - Mitral valve: Calcified annulus. Transvalvular velocity was  within the normal range. There was no evidence for stenosis.  There was trivial regurgitation. - Left atrium: The atrium was moderately dilated. - Right ventricle: The cavity size was normal. Wall thickness was  normal. Systolic function was normal. - Atrial septum: No defect or patent foramen ovale was identified  by color flow  Doppler. - Tricuspid valve: There was mild regurgitation. - Pulmonary arteries: Systolic pressure was within the normal  range. PA peak pressure: 29 mm Hg (S).   PHYSICAL EXAM  Temp:  [97.5 F (36.4 C)-97.7 F (36.5 C)] 97.6 F (36.4 C) (05/17 1200) Pulse Rate:  [51-104] 89 (05/17 1300) Resp:  [12-36] 24 (05/17 1300) BP: (138-192)/(39-129) 170/80 mmHg (05/17 1300) SpO2:  [94 %-100 %] 99 % (05/17 1300)  General - Well nourished, well developed, intubated on light sedation.  Ophthalmologic - Fundi not examined.  Cardiovascular - irregularly irregular heart rate and rhythm.  Neuro - awake, alert and oriented to Person, place and time, sitting on recliner at bedside. Following commands, two step commands, fund of knowledge intact. Eyes Pupils equal, double vision- central and peripheral vision. No significant facial asymmetry. Moving BUE and BLE spontaneously and equally.  Sensation and coordination and gait not tested.   ASSESSMENT/PLAN Ms. Adrienne [name] is a 82 y.o. female with history of atrial fibrillation on coumadin INR 1.5 with GIB 3  weeks ago presenting with R sided weakness and receptive phasia following a fall. She did not receive IV t-PA per documenation due to hx GIB and INR 1.5. CTA showed a L M2 occlusion. She was taken to IR with complete TICI 3 revascularization of L MCA with Solitaire and IA integrelin.   Stroke:  Dominant left MCA infarct s/p TICI3 revascularization of L MCA M2 occlusion with mechanical thrombectomy and IA integrelin. Infarct embolic secondary to known atrial fibrillation in setting of subtherapeutic coumadin.  Resultant  Right side weakness- Significantly improved  CTA Head & neck L MCA bifurcation occlusion, L ICA origin 60-65% stenosis, L ICA siphon moderate plaque. Intracranial atherosclerosis B ACA, R MCA, L PCA branches  CT perfusion L MCA perfusion deficit favorable for clot retrieval  Cerebral angiogram L MCA occlusion s/p TICI3  revascularization with Solitaire and IA integrelin, no left ICA origin stenosis  MRI  - Only a small volume of left MCA territory infarction occurred.  2D Echo  EF 60-65%   LDL 80  HgbA1c 5.6  VTE prophylaxis- SCDs and also now on Eliquis. D/c lovenox. Diet regular Room service appropriate?: Yes; Fluid consistency:: Thin  warfarin daily prior to admission INR 1.5 on arrival, now on aspirin 300 mg suppository daily. Discussed with husband, will change anticoagulation with eliquis due to low risk of GI bleeding than coumadin. Family agreed.   Ongoing aggressive stroke risk factor management  Therapy recommendations:  CIR   Disposition:  CIR  Transfer to Floor today.  Diplopia- Not corresponding to pts MRI findings, might need Opthalmology/optometry evaluation for glasses.  Acute respiratory failure  Extubated  CCM Recs appreciated, have signed off.  Atrial Fibrillation  Home anticoagulation:  now on Eliquis (apixaban) daily, started 5/17   INR 1.5 on admission  Rate controlled on PO diltiazem 60mg  Q8H, switch to CR am.  Discussed with husband   Recent GI bleeding  Required PRBC transfusion  H&H reduced at 8.9 today, baseline- 10-11, repeat CBC today, no obvious blood loss but now on eliquis  Coumadin was discontinued due to LGIB  Now on eliquis  Hypertension  Elevated today 170/80  Permissive hypertension (OK if < 220/120) but gradually normalize in 5-7 days  Hyperlipidemia  Home meds:  No statin  LDL 80, goal < 70  Start Atorvastatin at 20mg  daily  Continue statin at discharge  Other Stroke Risk Factors  Advanced age  Former Cigarette smoker  Hx stroke/TIA  Coronary artery disease  Atria Fib  Other Active Problems  R breast cancer 2012 on anastrozole  GERD  Hospital day # 2  Jenetta Downer, MD IMTS, Resident. 01/27/2016 2:31 PM [phone]

## 2016-01-27 NOTE — Progress Notes (Signed)
Patient transferred to 5M10. Patient is alert, oriented x3, skin intact, NIHHS 1 for expressive aphasia. Patient and family updated on plan of care. Patient reports falling at home prior to admission, bed alarm on, fall mat in place, high fall risk socks on, bracelet on. Call bell and phone explained with teach back. Will continue to monitor patient closely.

## 2016-01-27 NOTE — Care Management Note (Signed)
Case Management Note  Patient Details  Name: Ineze Susi MRN: [id number] Date of Birth: 29-Apr-1928  Subjective/Objective:    Pt admitted on 01/25/16 s/p Lt MCA infarct s/p clot retrieval in IR.  PTA, pt independent, lives with spouse.                Action/Plan: PT/OT recommending IP Rehab, and consult in progress.  Will follow for dc planning as pt progresses.    Expected Discharge Date:                  Expected Discharge Plan:  [place]  In-House Referral:     Discharge planning Services  CM Consult  Post Acute Care Choice:    Choice offered to:     DME Arranged:    DME Agency:     HH Arranged:    [place] Agency:     Status of Service:  In process, will continue to follow  Medicare Important Message Given:    Date Medicare IM Given:    Medicare IM give by:    Date Additional Medicare IM Given:    Additional Medicare Important Message give by:     If discussed at [place] of Stay Meetings, dates discussed:    Additional Comments:  Reinaldo Raddle, RN, BSN  Trauma/Neuro ICU Case Manager [phone]

## 2016-01-27 NOTE — Consult Note (Signed)
Physical Medicine and Rehabilitation Consult Reason for Consult: Left MCA infarct Referring Physician: Dr.Xu   HPI: Adrienne [name] is a 82 y.o.right handed  female with history of CAD, atrial fibrillation with chronic Coumadin. Patient lives with spouse. One level home. Used a occasional cane prior to admission. Patient recently relocated from [facility] to [place]. Recently admitted 01/07/2016 with intermittent bloody stools and upper abdominal pain. CT showed distal diverticulosis otherwise unrevealing. MRI of the spine showed T12 compression fracture as well as some disc protrusions. INR admission of 2.5 and received vitamin K. Hemoglobin 13-9. Underwent upper endoscopy per gastroenterology that showed small hiatal hernia. A medium nonbleeding diverticulum found in the area of the papilla. Patient's anticoagulation remained on hold and was discharged to home 01/10/2016 and plan to follow up outpatient with GI. Presented 01/25/2016 with right-sided weakness after being found down in the bathroom by her husband. Her Coumadin had recently been restarted one week prior. INR on admission 1.5. CT showed atrophy small vessel disease in the periventricular white matter. Evidence of prior infarct in the lateral left lentiform nucleus. CT angiogram head and neck positive for emergent large vessel occlusion at the left MCA bifurcation. Superimposed atherosclerotic stenosis left ICA origin left supraclinoid ICA. Marland Kitchen Underwent revascularization of occluded left MCA per interventional radiology. Maintain for a short time on ventilator. MRI showed left MCA territory infarct. Echocardiogram with ejection fraction of 65% no wall motion abnormalities. Neurology consulted presently maintained on aspirin therapy. No current plan to resume Coumadin at this time versus Eliquis. Physical therapy evaluation completed 01/27/2016 with recommendations of physical medicine rehabilitation consult.  Patient has no  pain complaints. Her speech is fluent  Review of Systems  Constitutional: Negative for fever and chills.  HENT: Negative for hearing loss.   Eyes: Negative for blurred vision and double vision.  Respiratory: Negative for cough and shortness of breath.   Cardiovascular: Positive for palpitations and leg swelling.  Gastrointestinal: Positive for abdominal pain, diarrhea and constipation. Negative for nausea.  Genitourinary: Positive for urgency. Negative for dysuria and hematuria.  Musculoskeletal: Positive for back pain and falls.  Neurological: Positive for weakness. Negative for seizures and headaches.  All other systems reviewed and are negative.  Past Medical History  Diagnosis Date  . Afib ([place])   . Elevated uric acid in blood   . Arthritis of ankle   . Pulmonary HTN ([place])   . Long-term (current) use of anticoagulants   . Moderate mitral regurgitation   . CAD (coronary artery disease)   . Edema   . Cancer [facility]) 2012    breast cancer-right  . Hypertension   . Urinary incontinence   . TIA (transient ischemic attack) 09/2011  . Diverticulosis   . Compression fracture of T12 vertebra [facility])    Past Surgical History  Procedure Laterality Date  . Cardiac catheterization  2013  . Replacement total knee bilateral    . Gallbladder surgery    . Appendectomy    . Mastectomy Right   . Cataract extraction, bilateral    . Breast surgery  2012    Rt.mastectomy--[place], [place]  . Tonsillectomy and adenoidectomy      as a child  . Dilation and curettage of uterus      in her early 15's for DUB  . Esophagogastroduodenoscopy Left 01/08/2016    Procedure: ESOPHAGOGASTRODUODENOSCOPY (EGD);  Surgeon: Arta Silence, MD;  Location: [facility];  Service: Endoscopy;  Laterality: Left;  . Radiology with anesthesia N/A 01/25/2016  Procedure: RADIOLOGY WITH ANESTHESIA;  Surgeon: Luanne Bras, MD;  Location: [place];  Service: Radiology;  Laterality: N/A;   Family History  Problem  Relation Age of Onset  . Asthma Mother   . CVA Father   . Atrial fibrillation Sister     HAS PACER  . Pneumonia Brother   . Cancer Brother     BREAST CANCER  . Cancer Daughter     COLON CANCER   Social History:  reports that she has quit smoking. Her smoking use included Cigarettes. She does not have any smokeless tobacco history on file. She reports that she does not drink alcohol or use illicit drugs. Allergies:  Allergies  Allergen Reactions  . Amiodarone Hcl [Amiodarone]     AFIB  . Compazine [Prochlorperazine]     HYPER  . Other Other (See Comments)    "Kidney dye"--patient states this gave her severe shakes/chills  . Thorazine [Chlorpromazine]     HYPER  . Valium [Diazepam]     HYPER   . Zometa [Zoledronic Acid]     PROBLEMS WITH EYES   Medications Prior to Admission  Medication Sig Dispense Refill  . acetaminophen (TYLENOL) 500 MG tablet Take 500-1,000 mg by mouth every 12 (twelve) hours as needed (pain).     Marland Kitchen anastrozole (ARIMIDEX) 1 MG tablet Take 1 mg by mouth daily.    Marland Kitchen CALCIUM PO Take 1 tablet by mouth daily at 3 pm.    . Cholecalciferol (VITAMIN D PO) Take 1 tablet by mouth daily.     Marland Kitchen diltiazem (CARDIZEM) 60 MG tablet Take 1 tablet (60 mg total) by mouth 3 (three) times daily. (Patient taking differently: Take 60 mg by mouth 3 (three) times daily. ) 270 tablet 3  . HYDROcodone-acetaminophen (NORCO/VICODIN) 5-325 MG tablet Take 1 tablet by mouth every 8 (eight) hours as needed for moderate pain.    . metoprolol (LOPRESSOR) 100 MG tablet Take 1 tablet (100 mg total) by mouth 2 (two) times daily. 180 tablet 3  . omeprazole (PRILOSEC) 40 MG capsule Take 40 mg by mouth daily.     Marland Kitchen trimethoprim (TRIMPEX) 100 MG tablet Take 100 mg by mouth daily.   11  . warfarin (COUMADIN) 4 MG tablet Take 4 mg by mouth daily at 3 pm. Name Brand      Home: Home Living Family/patient expects to be discharged to:: Private residence Living Arrangements: Spouse/significant  other Available Help at Discharge: Family Type of Home: House (duplex) Home Access: Stairs to enter [employer] of Steps: 1 stoop Entrance Stairs-Rails: None Home Layout: One level Bathroom Shower/Tub: Insurance claims handler: Civil engineer, contracting - built in, Engineer, manufacturing held shower head, Radio producer - single point  Functional History: Prior Function Level of Independence: Needs assistance Gait / Transfers Assistance Needed: per family, husband would carry her purse and provide his arm for balance during community gait.  Functional Status:  Mobility: Bed Mobility Overal bed mobility: Needs Assistance Bed Mobility: Supine to Sit Supine to sit: Min assist General bed mobility comments: Min assist to help pt steady herself to get to EOB.  Transfers Overall transfer level: Needs assistance Equipment used: 2 person hand held assist Transfers: Sit to/from Stand Sit to Stand: +2 safety/equipment, Min assist, Mod assist General transfer comment: Up to mod assist, especially once fatigued to help safely lower to the chair and ensure she was lined up with sitting surface before sitting down, pt tried to sit very prematurely.  Min assist to get to standing  wtih heavy reliance on bil LEs resting against bed for balance.   Ambulation/Gait Ambulation/Gait assistance: +2 physical assistance, Min assist, Mod assist Ambulation Distance (Feet): 10 Feet Assistive device: 2 person hand held assist Gait Pattern/deviations: Step-through pattern, Staggering right, Leaning posteriorly General Gait Details: Pt min assist to ambulate forward with one LOB to the right.  Mod assist when attempting to back up to the chair to sit down to prevent posterior LOB and premature sit.     ADL:    Cognition: Cognition Overall Cognitive Status: Impaired/Different from baseline Orientation Level: Oriented to person, Oriented to place, Oriented to time, Oriented to situation Cognition Arousal/Alertness:  Awake/alert Behavior During Therapy: WFL for tasks assessed/performed Overall Cognitive Status: Impaired/Different from baseline Area of Impairment: Orientation Orientation Level: Time  Blood pressure 169/104, pulse 86, temperature 97.7 F (36.5 C), temperature source Oral, resp. rate 19, height 5\' 7"  (1.702 m), weight 66.3 kg (146 lb 2.6 oz), last menstrual period 09/12/1973, SpO2 99 %. Physical Exam  Constitutional: She is oriented to person, place, and time. She appears well-developed.  HENT:  Head: Normocephalic.  Eyes: EOM are normal.  Neck: Normal range of motion. Neck supple. No thyromegaly present.  Cardiovascular:  Cardiac rate controlled  Respiratory: Effort normal and breath sounds normal.  GI: Soft. Bowel sounds are normal. She exhibits no distension.  Neurological: She is alert and oriented to person, place, and time.  Follows simple commands  Skin:  Multi bruises to extremeties  Motor strength is 4/5 bilateral deltoid, biceps, triceps, grip, hip flexor, knee extensor, ankle dorsiflexor and plantar flexor She has no pain with hip or knee range of motion. She is full shoulder range of motion. She is evidence of osteoarthritis in bilateral first carpometacarpal joints. Sensation intact to light touch in bilateral upper and lower limbs Speech without evidence of aphasia Visual fields are intact confrontation testing. She does complain of some double vision when looking straight ahead but this is not present with lateral gaze. There is no evidence of nystagmus. No extraocular muscle weakness  Results for orders placed or performed during the hospital encounter of 01/25/16 (from the past 24 hour(s))  Glucose, capillary     Status: Abnormal   Collection Time: 01/26/16  4:05 PM  Result Value Ref Range   Glucose-Capillary 103 (H) 65 - 99 mg/dL  Glucose, capillary     Status: None   Collection Time: 01/27/16 12:02 AM  Result Value Ref Range   Glucose-Capillary 98 65 - 99  mg/dL  CBC     Status: Abnormal   Collection Time: 01/27/16  3:40 AM  Result Value Ref Range   WBC 11.8 (H) 4.0 - 10.5 K/uL   RBC 3.03 (L) 3.87 - 5.11 MIL/uL   Hemoglobin 8.9 (L) 12.0 - 15.0 g/dL   HCT 27.3 (L) 36.0 - 46.0 %   MCV 90.1 78.0 - 100.0 fL   MCH 29.4 26.0 - 34.0 pg   MCHC 32.6 30.0 - 36.0 g/dL   RDW 14.8 11.5 - 15.5 %   Platelets 314 150 - 400 K/uL  Basic metabolic panel     Status: Abnormal   Collection Time: 01/27/16  3:40 AM  Result Value Ref Range   Sodium 143 135 - 145 mmol/L   Potassium 3.8 3.5 - 5.1 mmol/L   Chloride 112 (H) 101 - 111 mmol/L   CO2 22 22 - 32 mmol/L   Glucose, Bld 90 65 - 99 mg/dL   BUN 9 6 - 20 mg/dL  Creatinine, Ser 0.75 0.44 - 1.00 mg/dL   Calcium 8.3 (L) 8.9 - 10.3 mg/dL   GFR calc non Af Amer >60 >60 mL/min   GFR calc Af Amer >60 >60 mL/min   Anion gap 9 5 - 15  Glucose, capillary     Status: None   Collection Time: 01/27/16  8:25 AM  Result Value Ref Range   Glucose-Capillary 67 65 - 99 mg/dL   Comment 1 Notify RN    Comment 2 Document in Chart   Glucose, capillary     Status: Abnormal   Collection Time: 01/27/16  9:05 AM  Result Value Ref Range   Glucose-Capillary 63 (L) 65 - 99 mg/dL   Comment 1 Notify RN    Comment 2 Document in Chart   Glucose, capillary     Status: None   Collection Time: 01/27/16  9:38 AM  Result Value Ref Range   Glucose-Capillary 79 65 - 99 mg/dL   Ct Head Wo Contrast  01/25/2016  CLINICAL DATA:  Stroke. Left MCA occlusion status post endovascular revascularization. EXAM: CT HEAD WITHOUT CONTRAST TECHNIQUE: Contiguous axial images were obtained from the base of the skull through the vertex without intravenous contrast. COMPARISON:  Head CT and CTA earlier today FINDINGS: Residual intravascular contrast is present related to recent angiogram. There is subtly decreased gray-white differentiation with slight early sulcal effacement involving the left temporoparietal region in the posterior MCA distribution  consistent with early infarct. Minimal contrast staining and some retained intravascular contrast are noted in this region. No parenchymal hematoma is seen. There is no midline shift or extra-axial fluid collection. Mild cerebral atrophy and advanced chronic small vessel ischemic changes are again noted as well as a chronic right parietal cortical infarct. There is a dilated perivascular space versus chronic lacunar infarct in the inferior left putamen. Prior bilateral cataract extraction is noted. The visualized paranasal sinuses and mastoid air cells are clear. No acute osseous abnormality is identified. Calcified atherosclerosis is noted at the skullbase, and there are left TMJ joint degenerative changes. IMPRESSION: Early changes of acute posterior left MCA infarct. No parenchymal hematoma. Electronically Signed   By: Logan Bores M.D.   On: 01/25/2016 12:16   Mr Brain Wo Contrast  01/27/2016  CLINICAL DATA:  82 year old female who presented with emergent large vessel occlusion at the left MCA bifurcation status post Neuro-intervention, clot retrieval. Initial encounter. EXAM: MRI HEAD WITHOUT CONTRAST TECHNIQUE: Multiplanar, multiecho pulse sequences of the brain and surrounding structures were obtained without intravenous contrast. COMPARISON:  Post intervention head CT 01/25/2016, and earlier. FINDINGS: A small volume of cortical restricted diffusion is noted along the left sylvian fissure including the posterior left insula (series 4, image 22), as well as in the more posterior left temporal lobe and inferior parietal lobe. There is minimal posterior left periventricular white matter restriction. Occasional punctate areas of restricted diffusion over both superior convexities (e.g. Series 4, image 41), but no other confluent restricted diffusion. Major intracranial vascular flow voids are preserved. No acute intracranial hemorrhage identified. No midline shift, mass effect, evidence of mass lesion,  ventriculomegaly, or extra-axial collection. Cervicomedullary junction and pituitary are within normal limits. Patchy bilateral cerebral white matter T2 and FLAIR hyperintensity. Small chronic cortically based infarct in the right parietal lobe. Deep gray matter nuclei, brainstem, and cerebellum are normal for age. Visible internal auditory structures appear normal. Negative visualized cervical spine. Postoperative changes to the globes. Otherwise negative orbit and scalp soft tissues. Visualized bone marrow  signal is within normal limits. IMPRESSION: 1. Only a small volume of left MCA territory infarction occurred, primarily along the left sylvian fissure. No hemorrhage or mass effect. 2. Occasional other punctate areas of restricted diffusion over both superior convexities, probably neuro-intervention related. Electronically Signed   By: Genevie Ann M.D.   On: 01/27/2016 07:14   Dg Chest Port 1 View  01/26/2016  CLINICAL DATA:  Respiratory failure EXAM: PORTABLE CHEST 1 VIEW COMPARISON:  None. FINDINGS: Cardiomediastinal silhouette is stable. No acute infiltrate or pleural effusion. No pulmonary edema. NG tube in place. Endotracheal tube in place with tip 4.3 cm above the carina. Atherosclerotic calcifications of thoracic aorta. Probable chronic mild interstitial prominence. No pneumothorax. IMPRESSION: No active disease. Probable chronic mild interstitial prominence. Endotracheal and NG tube in place. No pneumothorax. Electronically Signed   By: Lahoma Crocker M.D.   On: 01/26/2016 11:18    Assessment/Plan: Diagnosis: Left MCA infarct with gait disorder 1. Does the need for close, 24 hr/day medical supervision in concert with the patient's rehab needs make it unreasonable for this patient to be served in a less intensive setting? Yes 2. Co-Morbidities requiring supervision/potential complications: History of recent GI bleed 3. Due to bladder management, bowel management, safety, skin/wound care, disease  management, medication administration, pain management and patient education, does the patient require 24 hr/day rehab nursing? Yes 4. Does the patient require coordinated care of a physician, rehab nurse, PT (1-2 hrs/day, 5 days/week), OT (1-2 hrs/day, 5 days/week) and SLP (.5-1 hrs/day, 5 days/week) to address physical and functional deficits in the context of the above medical diagnosis(es)? Yes Addressing deficits in the following areas: balance, endurance, locomotion, strength, transferring, bowel/bladder control, bathing, dressing, feeding, grooming, toileting and cognition 5. Can the patient actively participate in an intensive therapy program of at least 3 hrs of therapy per day at least 5 days per week? Yes 6. The potential for patient to make measurable gains while on inpatient rehab is excellent 7. Anticipated functional outcomes upon discharge from inpatient rehab are modified independent and supervision  with PT, modified independent and supervision with OT, modified independent with SLP. 8. Estimated rehab length of stay to reach the above functional goals is: 9-12d 9. Does the patient have adequate social supports and living environment to accommodate these discharge functional goals? Yes 10. Anticipated D/C setting: Home 11. Anticipated post D/C treatments: [facility] therapy 12. Overall Rehab/Functional Prognosis: excellent  RECOMMENDATIONS: This patient's condition is appropriate for continued rehabilitative care in the following setting: CIR Patient has agreed to participate in recommended program. Yes and Potentially Note that insurance prior authorization may be required for reimbursement for recommended care.  Comment:     01/27/2016

## 2016-01-27 NOTE — Progress Notes (Signed)
Pt is back from MRI.

## 2016-01-27 NOTE — Evaluation (Signed)
Physical Therapy Evaluation Patient Details Name: Adrienne [name] MRN: [id number] DOB: 1928/03/03 Today's Date: 01/27/2016   History of Present Illness  82 y.o. female admitted to [facility] on 01/25/16 for right sided weakness, aphasia, and fall.  Pt dx with right sided weakness, aphasia, and fall.  Pt dx with left MCA infarct.  Pt underwent clot retrevial in IR  Intubated post proceedure 01/25/16-01/26/16.  Pt with significant PMHx of GIB, A-fib, arthritis of the ankle, pulmonary HTN, CAD, mild mitral regurg, CAD, CA, HTN, TIA, compression fx T12, Bil TKA, and R mastectomy.  Clinical Impression  Pt was able to stand and walk a short distance with min to mod two person hand held assist.  She does have some vision and balance deficits.  LE strength seems to be intact and equal bil.  PT/OT recommending CIR level therapies so that pt can get strong and balanced enough to return home with her husband.   PT to follow acutely for deficits listed below.       Follow Up Recommendations CIR    Equipment Recommendations  Rolling walker with 5" wheels    Recommendations for Other Services Rehab consult     Precautions / Restrictions Precautions Precautions: Fall Precaution Comments: pt is unsteady on her feet, family reports this was the case PTA, but more now      Mobility  Bed Mobility Overal bed mobility: Needs Assistance Bed Mobility: Supine to Sit     Supine to sit: Min assist     General bed mobility comments: Min assist to help pt steady herself to get to EOB.   Transfers Overall transfer level: Needs assistance Equipment used: 2 person hand held assist Transfers: Sit to/from Stand Sit to Stand: +2 safety/equipment;Min assist;Mod assist         General transfer comment: Up to mod assist, especially once fatigued to help safely lower to the chair and ensure she was lined up with sitting surface before sitting down, pt tried to sit very prematurely.  Min assist to get to standing wtih  heavy reliance on bil LEs resting against bed for balance.    Ambulation/Gait Ambulation/Gait assistance: +2 physical assistance;Min assist;Mod assist Ambulation Distance (Feet): 10 Feet Assistive device: 2 person hand held assist Gait Pattern/deviations: Step-through pattern;Staggering right;Leaning posteriorly     General Gait Details: Pt min assist to ambulate forward with one LOB to the right.  Mod assist when attempting to back up to the chair to sit down to prevent posterior LOB and premature sit.       Modified Rankin (Stroke Patients Only) Modified Rankin (Stroke Patients Only) Pre-Morbid Rankin Score: Slight disability Modified Rankin: Moderately severe disability     Balance Overall balance assessment: Needs assistance Sitting-balance support: Feet supported;No upper extremity supported Sitting balance-Leahy Scale: Fair Sitting balance - Comments: when pt picks up a foot for MMT, significant posterior lean   Standing balance support: Bilateral upper extremity supported Standing balance-Leahy Scale: Poor                               Pertinent Vitals/Pain Pain Assessment: Faces Pain Score: 0-No pain    Home Living Family/patient expects to be discharged to:: Private residence Living Arrangements: Spouse/significant other Available Help at Discharge: Family Type of Home: House (duplex) Home Access: Stairs to enter Entrance Stairs-Rails: None Entrance Stairs-Number of Steps: 1 stoop Home Layout: One level Home Equipment: Shower seat - built in;Hand held shower head;Cane -  single point      Prior Function Level of Independence: Needs assistance   Gait / Transfers Assistance Needed: per family, husband would carry her purse and provide his arm for balance during community gait.            Hand Dominance   Dominant Hand: Right    Extremity/Trunk Assessment   Upper Extremity Assessment: Defer to OT evaluation           Lower  Extremity Assessment: Overall WFL for tasks assessed (5/5 strength, coordination WFL per heel to shin test)      Cervical / Trunk Assessment: Normal  Communication   Communication: Expressive difficulties (word finding)  Cognition Arousal/Alertness: Awake/alert Behavior During Therapy: WFL for tasks assessed/performed Overall Cognitive Status: Impaired/Different from baseline Area of Impairment: Orientation Orientation Level: Time                  General Comments General comments (skin integrity, edema, etc.): See OT notes re: vision           Assessment/Plan    PT Assessment Patient needs continued PT services  PT Diagnosis Difficulty walking;Abnormality of gait   PT Problem List Decreased activity tolerance;Decreased balance;Decreased mobility;Decreased knowledge of use of DME  PT Treatment Interventions DME instruction;Gait training;Stair training;Therapeutic activities;Functional mobility training;Therapeutic exercise;Balance training;Neuromuscular re-education;Patient/family education   PT Goals (Current goals can be found in the Care Plan section) Acute Rehab PT Goals Patient Stated Goal: to go home PT Goal Formulation: With patient/family Time For Goal Achievement: 02/10/16 Potential to Achieve Goals: Good    Frequency Min 4X/week        Co-evaluation PT/OT/SLP Co-Evaluation/Treatment: Yes Reason for Co-Treatment: For patient/therapist safety           End of Session Equipment Utilized During Treatment: Gait belt Activity Tolerance: Other (comment) (limited by dizziness in standing. ) Patient left: in chair;with call bell/phone within reach;with chair alarm set;with family/visitor present           Time: [id number] PT Time Calculation (min) (ACUTE ONLY): 48 min   Charges:   PT Evaluation $PT Eval Moderate Complexity: 1 Procedure          Areyanna Figeroa B. [place], [place], DPT [phone]   01/27/2016, 10:46 AM

## 2016-01-27 NOTE — Progress Notes (Signed)
ANTICOAGULATION CONSULT NOTE - Initial Consult  Pharmacy Consult for apixaban Indication: atrial fibrillation (hx of GIB requiring transfusion)  Allergies  Allergen Reactions  . Amiodarone Hcl [Amiodarone]     AFIB  . Compazine [Prochlorperazine]     HYPER  . Other Other (See Comments)    "Kidney dye"--patient states this gave her severe shakes/chills  . Thorazine [Chlorpromazine]     HYPER  . Valium [Diazepam]     HYPER   . Zometa [Zoledronic Acid]     PROBLEMS WITH EYES    Patient Measurements: Height: 5\' 7"  (170.2 cm) Weight: 146 lb 2.6 oz (66.3 kg) IBW/kg (Calculated) : 61.6   Vital Signs: Temp: 97.7 F (36.5 C) (05/17 0800) Temp Source: Oral (05/17 0800) BP: 169/104 mmHg (05/17 1007) Pulse Rate: 86 (05/17 1007)  Labs:  Recent Labs  01/25/16 0807 01/25/16 0814 01/26/16 0300 01/27/16 0340  HGB 10.8* 11.9* 10.7* 8.9*  HCT 33.3* 35.0* 32.8* 27.3*  PLT 372  --  325 314  APTT 32  --   --   --   LABPROT 17.8*  --  19.7*  --   INR 1.46  --  1.67*  --   CREATININE 1.03* 1.00 0.90 0.75    Estimated Creatinine Clearance: 49.1 mL/min (by C-G formula based on Cr of 0.75).   Medical History: Past Medical History  Diagnosis Date  . Afib ([place])   . Elevated uric acid in blood   . Arthritis of ankle   . Pulmonary HTN ([place])   . Long-term (current) use of anticoagulants   . Moderate mitral regurgitation   . CAD (coronary artery disease)   . Edema   . Cancer [facility]) 2012    breast cancer-right  . Hypertension   . Urinary incontinence   . TIA (transient ischemic attack) 09/2011  . Diverticulosis   . Compression fracture of T12 vertebra [facility])      Assessment: 71 yof admitted as Code Stroke (no TPA due to recent GIB, EGD in April). Previously on chronic warfarin for afib, now Pharmacy consulted by Neuro to dose apixaban. Hg down 8.9, plt wnl, no bleed documented.  Wt 66 kg, SCr 0.75  Last received prophylactic lovenox at 2100 last night.  Goal of Therapy:   Stroke prevention Monitor platelets by anticoagulation protocol: Yes   Plan:  Apixaban 5mg  bid Monitor CBC and for s/sx bleeding closely  Elicia Lamp, PharmD, [facility] Clinical Pharmacist Pager [phone] 01/27/2016 12:39 PM

## 2016-01-28 DIAGNOSIS — I6350 Cerebral infarction due to unspecified occlusion or stenosis of unspecified cerebral artery: Secondary | ICD-10-CM

## 2016-01-28 DIAGNOSIS — I4819 Other persistent atrial fibrillation: Secondary | ICD-10-CM | POA: Insufficient documentation

## 2016-01-28 DIAGNOSIS — K922 Gastrointestinal hemorrhage, unspecified: Secondary | ICD-10-CM | POA: Insufficient documentation

## 2016-01-28 LAB — CBC
HEMATOCRIT: 32.1 % — AB (ref 36.0–46.0)
HEMOGLOBIN: 10.1 g/dL — AB (ref 12.0–15.0)
MCH: 28.7 pg (ref 26.0–34.0)
MCHC: 31.5 g/dL (ref 30.0–36.0)
MCV: 91.2 fL (ref 78.0–100.0)
Platelets: 367 10*3/uL (ref 150–400)
RBC: 3.52 MIL/uL — AB (ref 3.87–5.11)
RDW: 14.9 % (ref 11.5–15.5)
WBC: 9.6 10*3/uL (ref 4.0–10.5)

## 2016-01-28 LAB — BASIC METABOLIC PANEL
Anion gap: 13 (ref 5–15)
BUN: 13 mg/dL (ref 6–20)
CALCIUM: 9 mg/dL (ref 8.9–10.3)
CO2: 23 mmol/L (ref 22–32)
CREATININE: 0.68 mg/dL (ref 0.44–1.00)
Chloride: 105 mmol/L (ref 101–111)
GFR calc non Af Amer: >60 mL/min (ref >60)
Glucose, Bld: 97 mg/dL (ref 65–99)
Potassium: 4.7 mmol/L (ref 3.5–5.1)
SODIUM: 141 mmol/L (ref 135–145)

## 2016-01-28 MED ORDER — IOPAMIDOL (ISOVUE-370) INJECTION 76%
100.0000 mL | Freq: Once | INTRAVENOUS | Status: AC | PRN
  Administered 2016-01-25: 100 mL via INTRAVENOUS

## 2016-01-28 MED ORDER — ACETAMINOPHEN 325 MG PO TABS
650.0000 mg | ORAL_TABLET | Freq: Four times a day (QID) | ORAL | Status: DC | PRN
  Administered 2016-01-29: 650 mg via ORAL
  Filled 2016-01-28: qty 2

## 2016-01-28 MED ORDER — ANASTROZOLE 1 MG PO TABS
1.0000 mg | ORAL_TABLET | Freq: Every day | ORAL | Status: DC
  Administered 2016-01-29: 1 mg via ORAL
  Filled 2016-01-28: qty 1

## 2016-01-28 NOTE — Progress Notes (Signed)
I met with pt and her husband at bedside to discuss a possible inpt rehab admission pending bed availability. They prefer inpt rehab rather than SNF. I am hopeful for bed available tomorrow and will see again in the morning. [phone]

## 2016-01-28 NOTE — Progress Notes (Signed)
Speech Language Pathology Treatment: Cognitive-Linquistic  Patient Details Name: Lynley Zachman MRN: [id number] DOB: 26-May-1928 Today's Date: 01/28/2016 Time: [id number] SLP Time Calculation (min) (ACUTE ONLY): 22 min  Assessment / Plan / Recommendation Clinical Impression  Pt seen for followup speech therapy. Pt reports decreasing episodes of difficulty with word finding. Occasional phonemic paraphasias noted in conversational speech with pt awareness of limitations and generally able to self-correct with min A. Discussed strategy of circumlocution to facilitate fluency of communication. Reading comprehension intact at the paragraph level. Gains noted with expressive speech. Will follow.   HPI HPI: Mataya Hauge is an 82 y.o. female patient who was brought into the emergency room with right sided weakness, aphasia, post fall. History provided by her husband.      SLP Plan  Continue with current plan of care     Recommendations                Plan: Continue with current plan of care     [place] 01/28/2016, 4:26 PM

## 2016-01-28 NOTE — H&P (Signed)
Physical Medicine and Rehabilitation Admission H&P    Chief Complaint  Patient presents with  . Code Stroke  : HPI: Adrienne [name] is a 82 y.o.right handed female with history ofRight breast cancer, CAD, atrial fibrillation with chronic Coumadin. Patient lives with spouse. One level home. Used a occasional cane prior to admission. Patient recently relocated from [facility] to [place]. Recently admitted 01/07/2016 with intermittent bloody stools and upper abdominal pain. CT showed distal diverticulosis otherwise unrevealing. MRI of the spine showed T12 compression fracture as well as some disc protrusions. INR admission of 2.5 and received vitamin K. Hemoglobin 13-9. Underwent upper endoscopy per gastroenterology that showed small hiatal hernia. A medium nonbleeding diverticulum found in the area of the papilla. Patient's anticoagulation remained on hold and was discharged to home 01/10/2016 and plan to follow up outpatient with GI. Presented 01/25/2016 with right-sided weakness after being found down in the bathroom by her husband. Her Coumadin had recently been restarted one week prior. INR on admission 1.5. CT showed atrophy small vessel disease in the periventricular white matter. Evidence of prior infarct in the lateral left lentiform nucleus. CT angiogram head and neck positive for emergent large vessel occlusion at the left MCA bifurcation. Superimposed atherosclerotic stenosis left ICA origin left supraclinoid ICA. Marland Kitchen Underwent revascularization of occluded left MCA per interventional radiology. Maintained for a short time on ventilator. MRI showed left MCA territory infarct. Echocardiogram with ejection fraction of 65% no wall motion abnormalities. Neurology consulted presently maintained on aspirin therapy and transition to Eliquis 01/28/2016 with no plan to resume Coumadin. Hemoglobin and hematocrit remained stable. Physical And occupational therapy evaluations completed 01/27/2016  with recommendations of physical medicine rehabilitation consult.Patient was admitted for a comprehensive rehabilitation program  ROS Constitutional: Negative for fever and chills.  HENT: Negative for hearing loss.  Eyes: Negative for blurred vision and double vision.  Respiratory: Negative for cough and shortness of breath.  Cardiovascular: Positive for palpitations and leg swelling.  Gastrointestinal: Positive for abdominal pain, diarrhea and constipation. Negative for nausea.  Genitourinary: Positive for urgency. Negative for dysuria and hematuria.  Musculoskeletal: Positive for back pain and falls.  Neurological: Positive for weakness. Negative for seizures and headaches.  All other systems reviewed and are negative   Past Medical History  Diagnosis Date  . Afib ([place])   . Elevated uric acid in blood   . Arthritis of ankle   . Pulmonary HTN ([place])   . Long-term (current) use of anticoagulants   . Moderate mitral regurgitation   . CAD (coronary artery disease)   . Edema   . Cancer [facility]) 2012    breast cancer-right  . Hypertension   . Urinary incontinence   . TIA (transient ischemic attack) 09/2011  . Diverticulosis   . Compression fracture of T12 vertebra [facility])    Past Surgical History  Procedure Laterality Date  . Cardiac catheterization  2013  . Replacement total knee bilateral    . Gallbladder surgery    . Appendectomy    . Mastectomy Right   . Cataract extraction, bilateral    . Breast surgery  2012    Rt.mastectomy--[place], [place]  . Tonsillectomy and adenoidectomy      as a child  . Dilation and curettage of uterus      in her early 65's for DUB  . Esophagogastroduodenoscopy Left 01/08/2016    Procedure: ESOPHAGOGASTRODUODENOSCOPY (EGD);  Surgeon: Arta Silence, MD;  Location: [facility];  Service: Endoscopy;  Laterality: Left;  . Radiology  with anesthesia N/A 01/25/2016    Procedure: RADIOLOGY WITH ANESTHESIA;  Surgeon: Luanne Bras, MD;  Location: [place];  Service: Radiology;  Laterality: N/A;   Family History  Problem Relation Age of Onset  . Asthma Mother   . CVA Father   . Atrial fibrillation Sister     HAS PACER  . Pneumonia Brother   . Cancer Brother     BREAST CANCER  . Cancer Daughter     COLON CANCER   Social History:  reports that she has quit smoking. Her smoking use included Cigarettes. She does not have any smokeless tobacco history on file. She reports that she does not drink alcohol or use illicit drugs. Allergies:  Allergies  Allergen Reactions  . Amiodarone Hcl [Amiodarone]     AFIB  . Compazine [Prochlorperazine]     HYPER  . Other Other (See Comments)    "Kidney dye"--patient states this gave her severe shakes/chills  . Thorazine [Chlorpromazine]     HYPER  . Valium [Diazepam]     HYPER   . Zometa [Zoledronic Acid]     PROBLEMS WITH EYES   Medications Prior to Admission  Medication Sig Dispense Refill  . acetaminophen (TYLENOL) 500 MG tablet Take 500-1,000 mg by mouth every 12 (twelve) hours as needed (pain).     Marland Kitchen anastrozole (ARIMIDEX) 1 MG tablet Take 1 mg by mouth daily.    Marland Kitchen CALCIUM PO Take 1 tablet by mouth daily at 3 pm.    . Cholecalciferol (VITAMIN D PO) Take 1 tablet by mouth daily.     Marland Kitchen diltiazem (CARDIZEM) 60 MG tablet Take 1 tablet (60 mg total) by mouth 3 (three) times daily. (Patient taking differently: Take 60 mg by mouth 3 (three) times daily. ) 270 tablet 3  . HYDROcodone-acetaminophen (NORCO/VICODIN) 5-325 MG tablet Take 1 tablet by mouth every 8 (eight) hours as needed for moderate pain.    . metoprolol (LOPRESSOR) 100 MG tablet Take 1 tablet (100 mg total) by mouth 2 (two) times daily. 180 tablet 3  . omeprazole (PRILOSEC) 40 MG capsule Take 40 mg by mouth daily.     Marland Kitchen trimethoprim (TRIMPEX) 100 MG tablet Take 100 mg by mouth daily.   11  . warfarin (COUMADIN) 4 MG tablet Take 4 mg by mouth daily at 3 pm. Name Brand      Home: Home Living Family/patient expects to be  discharged to:: Private residence Living Arrangements: Spouse/significant other Available Help at Discharge: Family Type of Home: House Home Access: Stairs to enter Technical brewer of Steps: 1 stoop Entrance Stairs-Rails: None Home Layout: One level Bathroom Shower/Tub: Multimedia programmer: Standard Home Equipment: Civil engineer, contracting - built in, Engineer, manufacturing held shower head, Radio producer - single point Additional Comments: pt with incr balance deficits very recently  Lives With: Spouse   Functional History: Prior Function Level of Independence: Needs assistance Gait / Transfers Assistance Needed: per family, husband would carry her purse and provide his arm for balance during community gait.  ADL's / Homemaking Assistance Needed: independent  Functional Status:  Mobility: Bed Mobility Overal bed mobility: Needs Assistance Bed Mobility: Supine to Sit Supine to sit: Supervision General bed mobility comments: SUpervision for safety. use of rail for support.  Transfers Overall transfer level: Needs assistance Equipment used: 1 person hand held assist Transfers: Sit to/from Stand Sit to Stand: Min guard General transfer comment: Min guard for safety. Stood from [employer], from toilet x1. Transferred to chair post ambulation bout.  CUes for controlled descent.  Ambulation/Gait Ambulation/Gait assistance: Min assist Ambulation Distance (Feet): 100 Feet (x2 bouts) Assistive device: 1 person hand held assist, None Gait Pattern/deviations: Step-through pattern, Decreased stride length, Staggering right General Gait Details: Min A for balance throughout gait training. Cues for forward gaze. Staggering at times. 1 seated rest break. Gait velocity: decreased    ADL: ADL Overall ADL's : Needs assistance/impaired Grooming: Wash/dry hands, Minimal assistance, Bed level Upper Body Bathing: Minimal assitance, Bed level Toilet Transfer: +2 for physical assistance, Moderate assistance Functional  mobility during ADLs: Moderate assistance, +2 for physical assistance General ADL Comments: Pt reports dizziness and need to sit. BP stable and monitored. pt with visual deficits affecting all adls see below  Cognition: Cognition Overall Cognitive Status: Within Functional Limits for tasks assessed (able to state her son was coming today and remembered what time his flight arrived. ) Arousal/Alertness: Awake/alert Orientation Level: Oriented X4 Attention: Alternating Alternating Attention: Appears intact Memory: Appears intact Awareness: Appears intact Problem Solving: Appears intact Executive Function: Reasoning, Sequencing, Organizing, Decision Making, Initiating, Self Monitoring, Self Correcting Reasoning: Appears intact Sequencing: Appears intact Organizing: Appears intact Decision Making: Appears intact Initiating: Appears intact Self Monitoring: Appears intact Self Correcting: Appears intact Safety/Judgment: Appears intact Rancho Los Amigos Scales of Cognitive Functioning: Purposeful/appropriate Cognition Arousal/Alertness: Awake/alert Behavior During Therapy: WFL for tasks assessed/performed Overall Cognitive Status: Within Functional Limits for tasks assessed (able to state her son was coming today and remembered what time his flight arrived. ) Area of Impairment: Orientation Orientation Level: Time  Physical Exam: Blood pressure 157/75, pulse 75, temperature 97.5 F (36.4 C), temperature source Oral, resp. rate 20, height 5' 7" (1.702 m), weight 66.3 kg (146 lb 2.6 oz), last menstrual period 09/12/1973, SpO2 98 %. Physical Exam Constitutional: She is oriented to person, place, and time. She appears well-developed.  HENT: oral mucosa pink and moist Head: Normocephalic.  Eyes: EOM are normal.  Neck: Normal range of motion. Neck supple. No thyromegaly present.  Cardiovascular:  Cardiac rate controlled. No murmurs or rubs Respiratory: Effort normal and breath sounds  normal. No wheezes rales or rhonchi GI: Soft. Bowel sounds are normal. She exhibits no distension.  Neurological: She is alert and oriented to person, place, and time.  Follows simple commands. Occasional word finding deficits but able to communicate fairly effectively.  No CN findings, (no diplopia today). No nystagmus Motor strength is 4/5 bilateral deltoid, biceps, triceps, grip, hip flexor, knee extensor, ankle dorsiflexor and plantar flexor. Slightly decreased FMC R>L UE's.  No sensory abnl seen. Musc: She has no pain with hip or knee range of motion. She is full shoulder range of motion. She is evidence of osteoarthritis in bilateral first carpometacarpal joints. Psych: pleasant and appropriate.    Results for orders placed or performed during the hospital encounter of 01/25/16 (from the past 48 hour(s))  Glucose, capillary     Status: Abnormal   Collection Time: 01/26/16  4:05 PM  Result Value Ref Range   Glucose-Capillary 103 (H) 65 - 99 mg/dL  Glucose, capillary     Status: None   Collection Time: 01/27/16 12:02 AM  Result Value Ref Range   Glucose-Capillary 98 65 - 99 mg/dL  CBC     Status: Abnormal   Collection Time: 01/27/16  3:40 AM  Result Value Ref Range   WBC 11.8 (H) 4.0 - 10.5 K/uL   RBC 3.03 (L) 3.87 - 5.11 MIL/uL   Hemoglobin 8.9 (L) 12.0 - 15.0 g/dL   HCT   27.3 (L) 36.0 - 46.0 %   MCV 90.1 78.0 - 100.0 fL   MCH 29.4 26.0 - 34.0 pg   MCHC 32.6 30.0 - 36.0 g/dL   RDW 14.8 11.5 - 15.5 %   Platelets 314 150 - 400 K/uL  Basic metabolic panel     Status: Abnormal   Collection Time: 01/27/16  3:40 AM  Result Value Ref Range   Sodium 143 135 - 145 mmol/L   Potassium 3.8 3.5 - 5.1 mmol/L   Chloride 112 (H) 101 - 111 mmol/L   CO2 22 22 - 32 mmol/L   Glucose, Bld 90 65 - 99 mg/dL   BUN 9 6 - 20 mg/dL   Creatinine, Ser 0.75 0.44 - 1.00 mg/dL   Calcium 8.3 (L) 8.9 - 10.3 mg/dL   GFR calc non Af Amer >60 >60 mL/min   GFR calc Af Amer >60 >60 mL/min    Comment:  (NOTE) The eGFR has been calculated using the CKD EPI equation. This calculation has not been validated in all clinical situations. eGFR's persistently <60 mL/min signify possible Chronic Kidney Disease.    Anion gap 9 5 - 15  Glucose, capillary     Status: None   Collection Time: 01/27/16  8:25 AM  Result Value Ref Range   Glucose-Capillary 67 65 - 99 mg/dL   Comment 1 Notify RN    Comment 2 Document in Chart   Glucose, capillary     Status: Abnormal   Collection Time: 01/27/16  9:05 AM  Result Value Ref Range   Glucose-Capillary 63 (L) 65 - 99 mg/dL   Comment 1 Notify RN    Comment 2 Document in Chart   Glucose, capillary     Status: None   Collection Time: 01/27/16  9:38 AM  Result Value Ref Range   Glucose-Capillary 79 65 - 99 mg/dL  CBC     Status: Abnormal   Collection Time: 01/27/16  3:39 PM  Result Value Ref Range   WBC 10.6 (H) 4.0 - 10.5 K/uL   RBC 3.32 (L) 3.87 - 5.11 MIL/uL   Hemoglobin 10.0 (L) 12.0 - 15.0 g/dL   HCT 30.5 (L) 36.0 - 46.0 %   MCV 91.9 78.0 - 100.0 fL   MCH 30.1 26.0 - 34.0 pg   MCHC 32.8 30.0 - 36.0 g/dL   RDW 15.0 11.5 - 15.5 %   Platelets 274 150 - 400 K/uL  Glucose, capillary     Status: None   Collection Time: 01/27/16  4:25 PM  Result Value Ref Range   Glucose-Capillary 73 65 - 99 mg/dL  CBC     Status: Abnormal   Collection Time: 01/28/16  7:51 AM  Result Value Ref Range   WBC 9.6 4.0 - 10.5 K/uL   RBC 3.52 (L) 3.87 - 5.11 MIL/uL   Hemoglobin 10.1 (L) 12.0 - 15.0 g/dL   HCT 32.1 (L) 36.0 - 46.0 %   MCV 91.2 78.0 - 100.0 fL   MCH 28.7 26.0 - 34.0 pg   MCHC 31.5 30.0 - 36.0 g/dL   RDW 14.9 11.5 - 15.5 %   Platelets 367 150 - 400 K/uL  Basic metabolic panel     Status: None   Collection Time: 01/28/16  7:51 AM  Result Value Ref Range   Sodium 141 135 - 145 mmol/L   Potassium 4.7 3.5 - 5.1 mmol/L   Chloride 105 101 - 111 mmol/L   CO2 23 22 - 32 mmol/L  Glucose, Bld 97 65 - 99 mg/dL   BUN 13 6 - 20 mg/dL   Creatinine, Ser 0.68  0.44 - 1.00 mg/dL   Calcium 9.0 8.9 - 10.3 mg/dL   GFR calc non Af Amer >60 >60 mL/min   GFR calc Af Amer >60 >60 mL/min    Comment: (NOTE) The eGFR has been calculated using the CKD EPI equation. This calculation has not been validated in all clinical situations. eGFR's persistently <60 mL/min signify possible Chronic Kidney Disease.    Anion gap 13 5 - 15   Mr Brain Wo Contrast  01/27/2016  CLINICAL DATA:  81 year old female who presented with emergent large vessel occlusion at the left MCA bifurcation status post Neuro-intervention, clot retrieval. Initial encounter. EXAM: MRI HEAD WITHOUT CONTRAST TECHNIQUE: Multiplanar, multiecho pulse sequences of the brain and surrounding structures were obtained without intravenous contrast. COMPARISON:  Post intervention head CT 01/25/2016, and earlier. FINDINGS: A small volume of cortical restricted diffusion is noted along the left sylvian fissure including the posterior left insula (series 4, image 22), as well as in the more posterior left temporal lobe and inferior parietal lobe. There is minimal posterior left periventricular white matter restriction. Occasional punctate areas of restricted diffusion over both superior convexities (e.g. Series 4, image 41), but no other confluent restricted diffusion. Major intracranial vascular flow voids are preserved. No acute intracranial hemorrhage identified. No midline shift, mass effect, evidence of mass lesion, ventriculomegaly, or extra-axial collection. Cervicomedullary junction and pituitary are within normal limits. Patchy bilateral cerebral white matter T2 and FLAIR hyperintensity. Small chronic cortically based infarct in the right parietal lobe. Deep gray matter nuclei, brainstem, and cerebellum are normal for age. Visible internal auditory structures appear normal. Negative visualized cervical spine. Postoperative changes to the globes. Otherwise negative orbit and scalp soft tissues. Visualized bone  marrow signal is within normal limits. IMPRESSION: 1. Only a small volume of left MCA territory infarction occurred, primarily along the left sylvian fissure. No hemorrhage or mass effect. 2. Occasional other punctate areas of restricted diffusion over both superior convexities, probably neuro-intervention related. Electronically Signed   By: Genevie Ann M.D.   On: 01/27/2016 07:14       Medical Problem List and Plan: 1.  Right hemiparesis and gait/functional deficits secondary to left MCA infarct status post revascularization of occluded left MCA 2.  DVT Prophylaxis/Anticoagulation: Eliquis. Monitor frequent bleeding episodes 3. Pain Management: Tylenol as needed 4. Hypertension/ Atrial fibrillation. Cardiac rate controlled. Cardizem 60 mg 3 times a day, Lopressor 100 mg twice a day. Monitor with increased mobility 5. Neuropsych: This patient is capable of making decisions on her own behalf. 6. Skin/Wound Care: Routine skin checks 7. Fluids/Electrolytes/Nutrition: Routine I&O's follow-up chemistries 8. Recent GI bleed requiring transfusion. Follow-up gastroenterology as needed. Continue Protonix. Follow-up CBC 9. History of right breast cancer. Continue armidex. 10. Hyperlipidemia. Lipitor   Post Admission Physician Evaluation: 1. Functional deficits secondary  to left MCA infarct. 2. Patient is admitted to receive collaborative, interdisciplinary care between the physiatrist, rehab nursing staff, and therapy team. 3. Patient's level of medical complexity and substantial therapy needs in context of that medical necessity cannot be provided at a lesser intensity of care such as a SNF. 4. Patient has experienced substantial functional loss from his/her baseline which was documented above under the "Functional History" and "Functional Status" headings.  Judging by the patient's diagnosis, physical exam, and functional history, the patient has potential for functional progress which will result in  measurable gains while on inpatient rehab.  These gains will be of substantial and practical use upon discharge  in facilitating mobility and self-care at the household level. 5. Physiatrist will provide 24 hour management of medical needs as well as oversight of the therapy plan/treatment and provide guidance as appropriate regarding the interaction of the two. 6. 24 hour rehab nursing will assist with bladder management, bowel management, safety, skin/wound care, disease management, medication administration, pain management and patient education  and help integrate therapy concepts, techniques,education, etc. 7. PT will assess and treat for/with: Lower extremity strength, range of motion, stamina, balance, functional mobility, safety, adaptive techniques and equipment, NMR, visual-spatial awareness, education.   Goals are: mod I . 8. OT will assess and treat for/with: ADL's, functional mobility, safety, upper extremity strength, adaptive techniques and equipment, NMR, ego support, community reintegration.   Goals are: mod I. Therapy may proceed with showering this patient. 9. SLP will assess and treat for/with: speech/language.  Goals are: mod I. 10. Case Management and Social Worker will assess and treat for psychological issues and discharge planning. 11. Team conference will be held weekly to assess progress toward goals and to determine barriers to discharge. 12. Patient will receive at least 3 hours of therapy per day at least 5 days per week. 13. ELOS: 7 days       14. Prognosis:  excellent     Meredith Staggers, MD, [facility] 01/29/2016

## 2016-01-28 NOTE — Care Management Important Message (Signed)
Important Message  Patient Details  Name: Quanique Krakowski MRN: [id number] Date of Birth: 04/04/1928   Medicare Important Message Given:  Yes    Bethena Roys, RN 01/28/2016, 3:08 PM

## 2016-01-28 NOTE — Progress Notes (Signed)
STROKE TEAM PROGRESS NOTE   HISTORY OF PRESENT ILLNESS Mistie Arsenault is an 82 y.o. female patient who was brought into the emergency room with right sided weakness, aphasia, post fall. History provided by her husband. Patient is aphasic and unable to provide any history. The patient woke up around 6:15 AM and patient was in her normal state of health, no weakness no speech problems at that time. Last seen normal by husband at 7 AM 01/25/2016.  The patient went to take shower and she appeared to fall in the bathroom and got some glass on her feet during the fall. When he checked on her after the fall, she was noted to have right-sided weakness, unable to respond to commands. Hence she was brought into the ER for further evaluation and stroke code was called by the EMS.  Patient has a history of atrial fibrillation, on Coumadin. She was admitted for GI bleed evaluation with a drop in hematocrit 3 weeks ago and after completion of extensive GI workup, Coumadin was restarted 1 week ago. Her INR on arrival was 1.5. NIHSS 23. mRS 0.  Patient was not administered IV t-PA secondary to  recent GI bleed less than 3 weeks ago, on coumadin. She was admitted to the neuro ICU for further evaluation and treatment.  SUBJECTIVE (INTERVAL HISTORY) Husband present. Pt sitting on recliner at bedside. No complaints today, wanted to know about eliquis and inpt rehab. Some bradycardia-40-50s, that resolved, pulse in the 70s now on home regimen.   OBJECTIVE Temp:  [97.5 F (36.4 C)-98.8 F (37.1 C)] 97.5 F (36.4 C) (05/18 1352) Pulse Rate:  [74-119] 75 (05/18 1352) Cardiac Rhythm:  [-] Atrial fibrillation (05/18 0800) Resp:  [18-28] 20 (05/18 1352) BP: (110-187)/(45-103) 157/75 mmHg (05/18 1352) SpO2:  [96 %-100 %] 98 % (05/18 1352)  CBC:  Recent Labs Lab 01/25/16 0807  01/26/16 0300  01/27/16 1539 01/28/16 0751  WBC 8.6  --  8.9  < > 10.6* 9.6  NEUTROABS 5.2  --  8.0*  --   --   --   HGB 10.8*  < >  10.7*  < > 10.0* 10.1*  HCT 33.3*  < > 32.8*  < > 30.5* 32.1*  MCV 89.5  --  90.4  < > 91.9 91.2  PLT 372  --  325  < > 274 367  < > = values in this interval not displayed.  Basic Metabolic Panel:   Recent Labs Lab 01/26/16 0300 01/27/16 0340 01/28/16 0751  NA 142 143 141  K 3.6 3.8 4.7  CL 107 112* 105  CO2 23 22 23   GLUCOSE 141* 90 97  BUN 7 9 13   CREATININE 0.90 0.75 0.68  CALCIUM 8.7* 8.3* 9.0  MG 1.9  --   --   PHOS 4.3  --   --     Lipid Panel:     Component Value Date/Time   CHOL 143 01/26/2016 0300   TRIG 101 01/26/2016 0300   HDL 43 01/26/2016 0300   CHOLHDL 3.3 01/26/2016 0300   VLDL 20 01/26/2016 0300   LDLCALC 80 01/26/2016 0300   HgbA1c:  Lab Results  Component Value Date   HGBA1C 5.6 01/26/2016   Urine Drug Screen:     Component Value Date/Time   LABOPIA NONE DETECTED 01/25/2016 1855   COCAINSCRNUR NONE DETECTED 01/25/2016 1855   LABBENZ NONE DETECTED 01/25/2016 1855   AMPHETMU NONE DETECTED 01/25/2016 1855   THCU NONE DETECTED 01/25/2016 1855   LABBARB  NONE DETECTED 01/25/2016 1855     IMAGING I have personally reviewed the radiological images below and agree with the radiology interpretations.  Ct Head Wo Contrast 01/25/2016  Early changes of acute posterior left MCA infarct. No parenchymal hematoma. 01/25/2016  ADDENDUM Left hemisphere ASPECTS score = 9; small area of cortical hypodensity in the left M2 as seen on series 21, image 14.had given verbal preliminary ASPECTS score of "10" at 0845 hours today. Hall M.D. 01/25/2016   Atrophy with small vessel disease in the periventricular white matter. Prior infarcts as noted above. No acute appearing infarct is evident. No hemorrhage or mass effect.   Ct Angio Head & Neck W/cm &/or Wo/cm 01/25/2016  1. Positive for Emergent Large Vessel Occlusion at The left MCA bifurcation. Abnormal left MCA perfusion with noncontrast head CT and CT Brain Perfusion parameters favorable for clot retrieval. 2.  Superimposed atherosclerotic stenosis at the left ICA origin (60-65%) and left supraclinoid ICA siphon (moderate) related to calcified plaque. 3. Superimposed mild to moderate second and third order branch intracranial atherosclerosis including in the bilateral ACA, right MCA, and left PCA branches. 4. Right carotid bifurcation calcified plaque without stenosis.   Ct C-spine No Charge 01/25/2016  Multilevel degenerative change without acute abnormality.   Ct Cerebral Perfusion W/cm 01/25/2016   1. Positive for Emergent Large Vessel Occlusion at The left MCA bifurcation. Abnormal left MCA perfusion with noncontrast head CT and CT Brain Perfusion parameters favorable for clot retrieval.   Cerebral angiogram 01/25/2016 bilateral common carotid arteriograms followed by complete revascularization of Occluded Lt MCA With x 1 pass with Solitaire 4 mmx 40 mm Retrieval device and 4 mg of superselective IA integrelin with TICI 3 revascularization.  MRI Brain Wo Contrast 01/27/2016 Only a small volume of left MCA territory infarction occurred, primarily along the left sylvian fissure. No hemorrhage or mass effect. 2. Occasional other punctate areas of restricted diffusion over both superior convexities, probably neuro-intervention related.   TTE - - Left ventricle: The cavity size was normal. Systolic function was  normal. The estimated ejection fraction was in the range of 60%  to 65%. Wall motion was normal; there were no regional wall  motion abnormalities. The study is not technically sufficient to  allow evaluation of LV diastolic function. - Aortic valve: Valve mobility was mildly restricted. Transvalvular  velocity was within the normal range. There was no stenosis.  There was trivial regurgitation. - Mitral valve: Calcified annulus. Transvalvular velocity was  within the normal range. There was no evidence for stenosis.  There was trivial regurgitation. - Left atrium: The atrium was  moderately dilated. - Right ventricle: The cavity size was normal. Wall thickness was  normal. Systolic function was normal. - Atrial septum: No defect or patent foramen ovale was identified  by color flow Doppler. - Tricuspid valve: There was mild regurgitation. - Pulmonary arteries: Systolic pressure was within the normal  range. PA peak pressure: 29 mm Hg (S).   PHYSICAL EXAM  Temp:  [97.5 F (36.4 C)-98.8 F (37.1 C)] 97.5 F (36.4 C) (05/18 1352) Pulse Rate:  [74-119] 75 (05/18 1352) Resp:  [18-28] 20 (05/18 1352) BP: (110-187)/(45-103) 157/75 mmHg (05/18 1352) SpO2:  [96 %-100 %] 98 % (05/18 1352)  General - Well nourished, well developed, intubated on light sedation.  Ophthalmologic - Fundi not examined.  Cardiovascular - irregularly irregular heart rate and rhythm.  Neuro - awake, alert and oriented to Person, place and time, sitting on recliner at bedside. Conversant. Moving  all extremities spontaneously and equally.  No significant facial asymmetry.  Sensation and coordination and gait not tested.   ASSESSMENT/PLAN Ms. Chrisette Filion is a 82 y.o. female with history of atrial fibrillation on coumadin INR 1.5 with GIB 3 weeks ago presenting with R sided weakness and receptive phasia following a fall. She did not receive IV t-PA per documenation due to hx GIB and INR 1.5. CTA showed a L M2 occlusion. She was taken to IR with complete TICI 3 revascularization of L MCA with Solitaire and IA integrelin.   Stroke:  Dominant left MCA infarct s/p TICI3 revascularization of L MCA M2 occlusion with mechanical thrombectomy and IA integrelin. Infarct embolic secondary to known atrial fibrillation in setting of subtherapeutic coumadin.  Resultant  Right side weakness- Significantly improved  CTA Head & neck L MCA bifurcation occlusion, L ICA origin 60-65% stenosis, L ICA siphon moderate plaque. Intracranial atherosclerosis B ACA, R MCA, L PCA branches  CT perfusion L MCA  perfusion deficit favorable for clot retrieval  Cerebral angiogram L MCA occlusion s/p TICI3 revascularization with Solitaire and IA integrelin, no left ICA origin stenosis  MRI  - Only a small volume of left MCA territory infarction occurred.  2D Echo  EF 60-65%   LDL 80  HgbA1c 5.6  VTE prophylaxis- SCDs and also now on Eliquis. D/c lovenox. Diet regular Room service appropriate?: Yes; Fluid consistency:: Thin  warfarin daily prior to admission INR 1.5 on arrival, now on eliquis  Ongoing aggressive stroke risk factor management  Therapy recommendations:  CIR   Disposition:  CIR  ?Diplopia- Not corresponding to pts MRI findings, might need Opthalmology/optometry evaluation for glasses.  Acute respiratory failure  Extubated  CCM Recs appreciated, have signed off.  Atrial Fibrillation  Home anticoagulation:  now on Eliquis (apixaban) daily, started 5/17   INR 1.5 on admission  Rate controlled on PO diltiazem 60mg  Q8H, switch to CR am, cont 100mg  BID of metop  Discussed with husband   Recent GI bleeding  Required PRBC transfusion  H&H reduced at 8.9 today, baseline- 10-11, repeat CBC today, no obvious blood loss but now on eliquis  Coumadin was discontinued due to LGIB  Now on eliquis  Hypertension  Elevated today 170/80  Permissive hypertension (OK if < 220/120) but gradually normalize in 5-7 days  Hyperlipidemia  Home meds:  No statin  LDL 80, goal < 70  Start Atorvastatin at 20mg  daily  Continue statin at discharge  Other Stroke Risk Factors  Advanced age  Former Cigarette smoker  Hx stroke/TIA  Coronary artery disease  Other Active Problems  R breast cancer 2012 on anastrozole  GERD  Hospital day # 3  Jenetta Downer, MD IMTS, Resident. 01/28/2016 2:26 PM [phone]

## 2016-01-28 NOTE — Care Management (Signed)
Case Management Note:  Initiated by Ellan Lambert 01-27-16 @ [phone].  Patient Details  Name: Adrienne [name] MRN: [id number] Date of Birth: [birth year]  Subjective/Objective: Pt admitted on 01/25/16 s/p Lt MCA infarct s/p clot retrieval in IR. PTA, pt independent, lives with spouse.    Action/Plan: PT/OT recommending IP Rehab, and consult in progress. Will follow for dc planning as pt progresses.   Expected Discharge Date:     Expected Discharge Plan: [place]  In-House Referral:    Discharge planning Services CM Consult  Post Acute Care Choice:   Choice offered to:    DME Arranged:   DME Agency:    HH Arranged:  [address] Agency:    Status of Service: In process, will continue to follow  Medicare Important Message Given:   Date Medicare IM Given:   Medicare IM give by:   Date Additional Medicare IM Given:   Additional Medicare Important Message give by:    If discussed at [place] of Stay Meetings, dates discussed:   Additional Comments: [place] 01-28-16 Jacqlyn Krauss, RN,BSN [phone] CM reached out to Liaison with CIR and hopefully CIR bed will be available on 01-29-16. No further needs from this CM.

## 2016-01-28 NOTE — Progress Notes (Signed)
Physical Therapy Treatment Patient Details Name: Adrienne [name] MRN: [id number] DOB: 1927/12/09 Today's Date: 01/28/2016    History of Present Illness 82 y.o. female admitted to [facility] on 01/25/16 for right sided weakness, aphasia, and fall.  Pt dx with right sided weakness, aphasia, and fall.  Pt dx with left MCA infarct.  Pt underwent clot retrevial in IR  Intubated post proceedure 01/25/16-01/26/16.  Pt with significant PMHx of GIB, A-fib, arthritis of the ankle, pulmonary HTN, CAD, mild mitral regurg, CAD, CA, HTN, TIA, compression fx T12, Bil TKA, and R mastectomy.    PT Comments    Patient progressing well towards PT goals. Requires Min A for balance during training due to unsteadiness and staggering. May try RW next session to see if this helps. Reports diplopia has resolved. Balance seems improved from yesterday. Will continue to follow.   Follow Up Recommendations  CIR     Equipment Recommendations  Rolling walker with 5" wheels    Recommendations for Other Services       Precautions / Restrictions Precautions Precautions: Fall Precaution Comments: pt is unsteady on her feet, family reports this was the case PTA, but more now Restrictions Weight Bearing Restrictions: No    Mobility  Bed Mobility Overal bed mobility: Needs Assistance Bed Mobility: Supine to Sit     Supine to sit: Supervision     General bed mobility comments: SUpervision for safety. use of rail for support.   Transfers Overall transfer level: Needs assistance Equipment used: 1 person hand held assist Transfers: Sit to/from Stand Sit to Stand: Min guard         General transfer comment: Min guard for safety. Stood from [employer], from toilet x1. Transferred to chair post ambulation bout. CUes for controlled descent.   Ambulation/Gait Ambulation/Gait assistance: Min assist Ambulation Distance (Feet): 100 Feet (x2 bouts) Assistive device: 1 person hand held assist;None Gait Pattern/deviations:  Step-through pattern;Decreased stride length;Staggering right Gait velocity: decreased   General Gait Details: Min A for balance throughout gait training. Cues for forward gaze. Staggering at times. 1 seated rest break.   Stairs            Wheelchair Mobility    Modified Rankin (Stroke Patients Only) Modified Rankin (Stroke Patients Only) Pre-Morbid Rankin Score: Slight disability Modified Rankin: Moderately severe disability     Balance Overall balance assessment: Needs assistance Sitting-balance support: Feet supported;No upper extremity supported Sitting balance-Leahy Scale: Fair Sitting balance - Comments: Able to change pad and perform pericare without assist.    Standing balance support: During functional activity Standing balance-Leahy Scale: Fair Standing balance comment: Able to stand at sink and wash hands with close min guard.                     Cognition Arousal/Alertness: Awake/alert Behavior During Therapy: WFL for tasks assessed/performed Overall Cognitive Status: Within Functional Limits for tasks assessed (able to state her son was coming today and remembered what time his flight arrived. )                      Exercises      General Comments General comments (skin integrity, edema, etc.): [place] present during session.      Pertinent Vitals/Pain Pain Assessment: No/denies pain    Home Living                      Prior Function  PT Goals (current goals can now be found in the care plan section) Progress towards PT goals: Progressing toward goals    Frequency  Min 4X/week    PT Plan Current plan remains appropriate    Co-evaluation             End of Session Equipment Utilized During Treatment: Gait belt Activity Tolerance: Patient tolerated treatment well Patient left: in chair;with call bell/phone within reach;with family/visitor present     Time: [id number] PT Time Calculation (min)  (ACUTE ONLY): 23 min  Charges:  $Gait Training: 8-22 mins $Therapeutic Activity: 8-22 mins                    G Codes:      Nyrah Demos A Wei Newbrough 01/28/2016, 9:00 AM  Wray Kearns, [place], DPT [phone]

## 2016-01-29 ENCOUNTER — Inpatient Hospital Stay (HOSPITAL_COMMUNITY)
Admission: RE | Admit: 2016-01-29 | Discharge: 2016-02-05 | DRG: 057 | Disposition: A | Discharge status: Home / Self Care | Payer: Medicare Other | Source: Intra-hospital | Attending: Physical Medicine & Rehabilitation | Admitting: Physical Medicine & Rehabilitation

## 2016-01-29 DIAGNOSIS — E785 Hyperlipidemia, unspecified: Secondary | ICD-10-CM | POA: Diagnosis present

## 2016-01-29 DIAGNOSIS — I2510 Atherosclerotic heart disease of native coronary artery without angina pectoris: Secondary | ICD-10-CM

## 2016-01-29 DIAGNOSIS — H532 Diplopia: Secondary | ICD-10-CM

## 2016-01-29 DIAGNOSIS — I340 Nonrheumatic mitral (valve) insufficiency: Secondary | ICD-10-CM | POA: Diagnosis present

## 2016-01-29 DIAGNOSIS — G8191 Hemiplegia, unspecified affecting right dominant side: Secondary | ICD-10-CM | POA: Diagnosis not present

## 2016-01-29 DIAGNOSIS — N3091 Cystitis, unspecified with hematuria: Secondary | ICD-10-CM | POA: Diagnosis not present

## 2016-01-29 DIAGNOSIS — I63412 Cerebral infarction due to embolism of left middle cerebral artery: Secondary | ICD-10-CM | POA: Diagnosis present

## 2016-01-29 DIAGNOSIS — R269 Unspecified abnormalities of gait and mobility: Secondary | ICD-10-CM | POA: Diagnosis not present

## 2016-01-29 DIAGNOSIS — Z87891 Personal history of nicotine dependence: Secondary | ICD-10-CM

## 2016-01-29 DIAGNOSIS — Z9011 Acquired absence of right breast and nipple: Secondary | ICD-10-CM | POA: Diagnosis not present

## 2016-01-29 DIAGNOSIS — Z96653 Presence of artificial knee joint, bilateral: Secondary | ICD-10-CM | POA: Diagnosis present

## 2016-01-29 DIAGNOSIS — Z9842 Cataract extraction status, left eye: Secondary | ICD-10-CM

## 2016-01-29 DIAGNOSIS — M19079 Primary osteoarthritis, unspecified ankle and foot: Secondary | ICD-10-CM | POA: Diagnosis present

## 2016-01-29 DIAGNOSIS — Z9841 Cataract extraction status, right eye: Secondary | ICD-10-CM | POA: Diagnosis not present

## 2016-01-29 DIAGNOSIS — K5900 Constipation, unspecified: Secondary | ICD-10-CM | POA: Diagnosis present

## 2016-01-29 DIAGNOSIS — M4854XA Collapsed vertebra, not elsewhere classified, thoracic region, initial encounter for fracture: Secondary | ICD-10-CM | POA: Diagnosis present

## 2016-01-29 DIAGNOSIS — K219 Gastro-esophageal reflux disease without esophagitis: Secondary | ICD-10-CM

## 2016-01-29 DIAGNOSIS — I69354 Hemiplegia and hemiparesis following cerebral infarction affecting left non-dominant side: Principal | ICD-10-CM

## 2016-01-29 DIAGNOSIS — Z853 Personal history of malignant neoplasm of breast: Secondary | ICD-10-CM | POA: Diagnosis not present

## 2016-01-29 DIAGNOSIS — R0781 Pleurodynia: Secondary | ICD-10-CM

## 2016-01-29 DIAGNOSIS — Z7901 Long term (current) use of anticoagulants: Secondary | ICD-10-CM

## 2016-01-29 DIAGNOSIS — I10 Essential (primary) hypertension: Secondary | ICD-10-CM | POA: Diagnosis present

## 2016-01-29 DIAGNOSIS — Z888 Allergy status to other drugs, medicaments and biological substances status: Secondary | ICD-10-CM | POA: Diagnosis not present

## 2016-01-29 DIAGNOSIS — Z79899 Other long term (current) drug therapy: Secondary | ICD-10-CM | POA: Diagnosis not present

## 2016-01-29 DIAGNOSIS — I4891 Unspecified atrial fibrillation: Secondary | ICD-10-CM | POA: Diagnosis present

## 2016-01-29 DIAGNOSIS — I272 Other secondary pulmonary hypertension: Secondary | ICD-10-CM | POA: Diagnosis present

## 2016-01-29 DIAGNOSIS — Z8673 Personal history of transient ischemic attack (TIA), and cerebral infarction without residual deficits: Secondary | ICD-10-CM

## 2016-01-29 DIAGNOSIS — I63512 Cerebral infarction due to unspecified occlusion or stenosis of left middle cerebral artery: Secondary | ICD-10-CM | POA: Diagnosis not present

## 2016-01-29 DIAGNOSIS — K5791 Diverticulosis of intestine, part unspecified, without perforation or abscess with bleeding: Secondary | ICD-10-CM | POA: Diagnosis not present

## 2016-01-29 DIAGNOSIS — I69398 Other sequelae of cerebral infarction: Secondary | ICD-10-CM | POA: Diagnosis not present

## 2016-01-29 LAB — BASIC METABOLIC PANEL
ANION GAP: 10 (ref 5–15)
BUN: 14 mg/dL (ref 6–20)
CALCIUM: 8.7 mg/dL — AB (ref 8.9–10.3)
CO2: 25 mmol/L (ref 22–32)
CREATININE: 0.66 mg/dL (ref 0.44–1.00)
Chloride: 105 mmol/L (ref 101–111)
Glucose, Bld: 96 mg/dL (ref 65–99)
Potassium: 4.5 mmol/L (ref 3.5–5.1)
Sodium: 140 mmol/L (ref 135–145)

## 2016-01-29 LAB — CBC
HEMATOCRIT: 31.5 % — AB (ref 36.0–46.0)
Hemoglobin: 9.8 g/dL — ABNORMAL LOW (ref 12.0–15.0)
MCH: 28.5 pg (ref 26.0–34.0)
MCHC: 31.1 g/dL (ref 30.0–36.0)
MCV: 91.6 fL (ref 78.0–100.0)
Platelets: 346 10*3/uL (ref 150–400)
RBC: 3.44 MIL/uL — ABNORMAL LOW (ref 3.87–5.11)
RDW: 14.8 % (ref 11.5–15.5)
WBC: 11.5 10*3/uL — AB (ref 4.0–10.5)

## 2016-01-29 MED ORDER — ENSURE ENLIVE PO LIQD
237.0000 mL | Freq: Two times a day (BID) | ORAL | Status: DC
  Administered 2016-01-30 – 2016-02-04 (×8): 237 mL via ORAL

## 2016-01-29 MED ORDER — ANASTROZOLE 1 MG PO TABS
1.0000 mg | ORAL_TABLET | Freq: Every day | ORAL | Status: DC
  Administered 2016-01-30 – 2016-02-05 (×7): 1 mg via ORAL
  Filled 2016-01-29 (×8): qty 1

## 2016-01-29 MED ORDER — DILTIAZEM HCL 60 MG PO TABS
60.0000 mg | ORAL_TABLET | Freq: Three times a day (TID) | ORAL | Status: DC
  Administered 2016-01-29 – 2016-02-05 (×20): 60 mg via ORAL
  Filled 2016-01-29 (×20): qty 1

## 2016-01-29 MED ORDER — SENNOSIDES-DOCUSATE SODIUM 8.6-50 MG PO TABS
1.0000 | ORAL_TABLET | Freq: Every evening | ORAL | Status: DC | PRN
  Administered 2016-01-30: 1 via ORAL
  Filled 2016-01-29: qty 1

## 2016-01-29 MED ORDER — APIXABAN 5 MG PO TABS
5.0000 mg | ORAL_TABLET | Freq: Two times a day (BID) | ORAL | Status: DC
  Administered 2016-01-29 – 2016-02-05 (×14): 5 mg via ORAL
  Filled 2016-01-29 (×14): qty 1

## 2016-01-29 MED ORDER — ATORVASTATIN CALCIUM 20 MG PO TABS
20.0000 mg | ORAL_TABLET | Freq: Every day | ORAL | Status: DC
  Administered 2016-01-30 – 2016-02-04 (×6): 20 mg via ORAL
  Filled 2016-01-29 (×2): qty 1
  Filled 2016-01-29: qty 2
  Filled 2016-01-29 (×3): qty 1
  Filled 2016-01-29: qty 2

## 2016-01-29 MED ORDER — SORBITOL 70 % SOLN: 30.0000 mL | Freq: Every day | Status: DC | PRN

## 2016-01-29 MED ORDER — PANTOPRAZOLE SODIUM 40 MG PO TBEC
40.0000 mg | DELAYED_RELEASE_TABLET | Freq: Every day | ORAL | Status: DC
  Administered 2016-01-30 – 2016-02-05 (×7): 40 mg via ORAL
  Filled 2016-01-29 (×7): qty 1

## 2016-01-29 MED ORDER — ACETAMINOPHEN 325 MG PO TABS
650.0000 mg | ORAL_TABLET | Freq: Four times a day (QID) | ORAL | Status: DC | PRN
  Administered 2016-01-31 – 2016-02-02 (×6): 650 mg via ORAL
  Filled 2016-01-29 (×6): qty 2

## 2016-01-29 MED ORDER — ONDANSETRON HCL 4 MG/2ML IJ SOLN: 4.0000 mg | Freq: Four times a day (QID) | INTRAMUSCULAR | Status: DC | PRN

## 2016-01-29 MED ORDER — ONDANSETRON HCL 4 MG PO TABS: 4.0000 mg | ORAL_TABLET | Freq: Four times a day (QID) | ORAL | Status: DC | PRN

## 2016-01-29 MED ORDER — METOPROLOL TARTRATE 50 MG PO TABS
100.0000 mg | ORAL_TABLET | Freq: Two times a day (BID) | ORAL | Status: DC
  Administered 2016-01-29 – 2016-02-05 (×14): 100 mg via ORAL
  Filled 2016-01-29 (×2): qty 2
  Filled 2016-01-29: qty 4
  Filled 2016-01-29 (×12): qty 2

## 2016-01-29 NOTE — Progress Notes (Signed)
Patient states she has left ankle pain is increasing. It is red, tender. Dr. Erlinda Hong notified. MD states to notify CIR team. RN notified.

## 2016-01-29 NOTE — PMR Pre-admission (Signed)
PMR Admission Coordinator Pre-Admission Assessment  Patient: Adrienne [name] is an 82 y.o., female MRN: [id number] DOB: [birth year] Height: 5\' 7"  (170.2 cm) Weight: 66.3 kg (146 lb 2.6 oz)              Insurance Information HMO:     PPO:      PCP:      IPA:      80/20: yes     OTHER: no HMO PRIMARY: Medicare a and b      Policy#: [id number]      Subscriber: pt Benefits:  Phone #: online     Name: 5/19 Eff. Date: 12/12/94     Deduct: $1316      Out of Pocket Max: none      Life Max: none CIR: 100%      SNF: 20 full days Outpatient: 80%     Co-Pay: 20% Home Health: 100%      Co-Pay: none DME: 80%     Co-Pay: 20% Providers: pt choice  SECONDARY: aarp medicare supplement      Policy#: [id number]      Subscriber: pt  Medicaid Application Date:       Case Manager:  Disability Application Date:       Case Worker:   Emergency [place]    Name Relation Home Work Mobile   [place] Spouse [phone]  [phone]   [name],Adrienne Daughter   [phone]     Current Medical History  Patient Admitting Diagnosis: left MCA infarct  History of Present Illness: Adrienne [name] is a 82 y.o.right handed female with history of Right breast cancer, CAD, atrial fibrillation with chronic Coumadin. Patient lives with spouse. Recently admitted 01/07/2016 with intermittent bloody stools and upper abdominal pain. CT showed distal diverticulosis otherwise unrevealing. MRI of the spine showed T12 compression fracture as well as some disc protrusions. INR admission of 2.5 and received vitamin K. Hemoglobin 13-9. Underwent upper endoscopy per gastroenterology that showed small hiatal hernia. A medium nonbleeding diverticulum found in the area of the papilla. Patient's anticoagulation remained on hold and was discharged to home 01/10/2016 and plan to follow up outpatient with GI. Presented 01/25/2016 with right-sided weakness after being found down in the bathroom by her  husband. Her Coumadin had recently been restarted one week prior. INR on admission 1.5. CT showed atrophy small vessel disease in the periventricular white matter. Evidence of prior infarct in the lateral left lentiform nucleus. CT angiogram head and neck positive for emergent large vessel occlusion at the left MCA bifurcation. Superimposed atherosclerotic stenosis left ICA origin left supraclinoid ICA. Marland Kitchen Underwent revascularization of occluded left MCA per interventional radiology. Maintained for a short time on ventilator. MRI showed left MCA territory infarct. Echocardiogram with ejection fraction of 65% no wall motion abnormalities. Neurology consulted presently maintained on aspirin therapy and transition to Eliquis 01/28/2016 with no plan to resume Coumadin. Hemoglobin and hematocrit remained stable.   Total: 1 NIH    Past Medical History  Past Medical History  Diagnosis Date  . Afib ([facility])   . Elevated uric acid in blood   . Arthritis of ankle   . Pulmonary HTN ([facility])   . Long-term (current) use of anticoagulants   . Moderate mitral regurgitation   . CAD (coronary artery disease)   . Edema   . Cancer [facility]) 2012    breast cancer-right  . Hypertension   . Urinary incontinence   . TIA (transient ischemic attack) 09/2011  . Diverticulosis   .  Compression fracture of T12 vertebra ([facility])     Family History  family history includes Asthma in her mother; Atrial fibrillation in her sister; CVA in her father; Cancer in her brother and daughter; Pneumonia in her brother.  Prior Rehab/Hospitalizations:  Has the patient had major surgery during 100 days prior to admission? No  Current Medications   Current facility-administered medications:  .  acetaminophen (TYLENOL) tablet 650 mg, 650 mg, Oral, Q6H PRN **OR** [DISCONTINUED] acetaminophen (TYLENOL) suppository 650 mg, 650 mg, Rectal, Q4H PRN, Ram Fuller Mandril, MD .  anastrozole (ARIMIDEX) tablet 1 mg, 1 mg, Oral, Daily, Skeet Simmer, RPH, 1 mg at 01/29/16 1006 .  apixaban (ELIQUIS) tablet 5 mg, 5 mg, Oral, BID, Romona Curls, RPH, 5 mg at 01/29/16 1006 .  atorvastatin (LIPITOR) tablet 20 mg, 20 mg, Oral, q1800, Ejiroghene E Emokpae, MD, 20 mg at 01/28/16 1815 .  diltiazem (CARDIZEM) tablet 60 mg, 60 mg, Oral, Q8H, Norman Clay, MD, 60 mg at 01/29/16 [id number] .  feeding supplement (ENSURE ENLIVE) (ENSURE ENLIVE) liquid 237 mL, 237 mL, Oral, BID BM, Asencion Islam, RD, 237 mL at 01/28/16 1339 .  labetalol (NORMODYNE,TRANDATE) injection 10 mg, 10 mg, Intravenous, Q10 min PRN, Ram Fuller Mandril, MD, 10 mg at 01/26/16 2329 .  metoprolol (LOPRESSOR) tablet 100 mg, 100 mg, Oral, BID, Rosalin Hawking, MD, 100 mg at 01/29/16 1006 .  pantoprazole (PROTONIX) EC tablet 40 mg, 40 mg, Oral, Daily, Rosalin Hawking, MD, 40 mg at 01/29/16 1006 .  senna-docusate (Senokot-S) tablet 1 tablet, 1 tablet, Oral, QHS PRN, Ram Fuller Mandril, MD  Patients Current Diet: Diet regular Room service appropriate?: Yes; Fluid consistency:: Thin  Precautions / Restrictions Precautions Precautions: Fall Precaution Comments: pt is unsteady on her feet, family reports this was the case PTA, but more now Restrictions Weight Bearing Restrictions: No   Has the patient had 2 or more falls or a fall with injury in the past year?No  Prior Activity Level Community (5-7x/wk): Independent without AD. Does not drive. Holds to spouse arm when in community  Has not driven in years due to lack of confidence. Spouse states noted LE weakness over past year. No falls. She cooks and she and spouse clean together. Married 47 years  Development worker, international aid / Paramedic Devices/Equipment: None Home Equipment: Shower seat - built in, Engineer, manufacturing held shower head, Kasandra Knudsen - single point  Prior Device Use: Indicate devices/aids used by the patient prior to current illness, exacerbation or injury? None of the above holds to spouse arm to walk in  community  Prior Functional Level Prior Function Level of Independence: Needs assistance Gait / Transfers Assistance Needed: per family, husband would carry her purse and provide his arm for balance during community gait.  ADL's / Homemaking Assistance Needed: independent  Self Care: Did the patient need help bathing, dressing, using the toilet or eating?  Independent  Indoor Mobility: Did the patient need assistance with walking from room to room (with or without device)? Independent  Stairs: Did the patient need assistance with internal or external stairs (with or without device)? Needed some help  Functional Cognition: Did the patient need help planning regular tasks such as shopping or remembering to take medications? Independent  Current Functional Level Cognition  Arousal/Alertness: Awake/alert Overall Cognitive Status: Within Functional Limits for tasks assessed (able to state her son was coming today and remembered what time his flight arrived. ) Orientation Level: Oriented X4 Attention: Alternating Alternating  Attention: Appears intact Memory: Appears intact Awareness: Appears intact Problem Solving: Appears intact Executive Function: Reasoning, Sequencing, Organizing, Decision Making, Initiating, Self Monitoring, Self Correcting Reasoning: Appears intact Sequencing: Appears intact Organizing: Appears intact Decision Making: Appears intact Initiating: Appears intact Self Monitoring: Appears intact Self Correcting: Appears intact Safety/Judgment: Appears intact [employer] Scales of Cognitive Functioning: Purposeful/appropriate    Extremity Assessment (includes Sensation/Coordination)  Upper Extremity Assessment: RUE deficits/detail RUE Deficits / Details: R dominant and decr senstation. Decr awareness to R hand deficits RUE Coordination: decreased fine motor, decreased gross motor  Lower Extremity Assessment: Defer to PT evaluation    ADLs  Overall ADL's :  Needs assistance/impaired Grooming: Wash/dry hands, Minimal assistance, Bed level Upper Body Bathing: Minimal assitance, Bed level Toilet Transfer: +2 for physical assistance, Moderate assistance Functional mobility during ADLs: Moderate assistance, +2 for physical assistance General ADL Comments: Pt reports dizziness and need to sit. BP stable and monitored. pt with visual deficits affecting all adls see below    Mobility  Overal bed mobility: Needs Assistance Bed Mobility: Supine to Sit Supine to sit: Supervision General bed mobility comments: SUpervision for safety. use of rail for support.     Transfers  Overall transfer level: Needs assistance Equipment used: 1 person hand held assist Transfers: Sit to/from Stand Sit to Stand: Min guard General transfer comment: Min guard for safety. Stood from [employer], from toilet x1. Transferred to chair post ambulation bout. CUes for controlled descent.     Ambulation / Gait / Stairs / Wheelchair Mobility  Ambulation/Gait Ambulation/Gait assistance: Museum/gallery curator (Feet): 100 Feet (x2 bouts) Assistive device: 1 person hand held assist, None Gait Pattern/deviations: Step-through pattern, Decreased stride length, Staggering right General Gait Details: Min A for balance throughout gait training. Cues for forward gaze. Staggering at times. 1 seated rest break. Gait velocity: decreased    Posture / Balance Dynamic Sitting Balance Sitting balance - Comments: Able to change pad and perform pericare without assist.  Balance Overall balance assessment: Needs assistance Sitting-balance support: Feet supported, No upper extremity supported Sitting balance-Leahy Scale: Fair Sitting balance - Comments: Able to change pad and perform pericare without assist.  Standing balance support: During functional activity Standing balance-Leahy Scale: Fair Standing balance comment: Able to stand at sink and wash hands with close min guard.      Special needs/care consideration  Skin bilateral arm ecchymosis Bowel mgmt: continent. No BM documented this admit Bladder mgmt: some incontinence wears pad. Uses pessary for few years for bladder prolapse    Previous Home Environment Living Arrangements: Spouse/significant other  Lives With: Spouse Available Help at Discharge: Family Type of Home: House Home Layout: One level Home Access: Stairs to enter Entrance Stairs-Rails: None Entrance Stairs-Number of Steps: 1 stoop Bathroom Shower/Tub: Multimedia programmer: [place]: No Additional Comments: pt with incr balance deficits very recently  Discharge Living Setting Plans for Discharge Living Setting: Patient's home, Lives with (comment) (spouse) Type of Home at Discharge: House Discharge Home Layout: One level Discharge Home Access: Stairs to enter Entrance Stairs-Rails: None Entrance Stairs-Number of Steps: 1 stoop Discharge Bathroom Shower/Tub: Walk-in shower Discharge Bathroom Toilet: Standard Does the patient have any problems obtaining your medications?: No  Social/Family/Support Systems Patient Roles: Spouse, Parent (3 children, daughter local) Sport and exercise psychologist Information: [name], spouse Anticipated Caregiver: spouse Anticipated Ambulance person Information: see above Ability/Limitations of Caregiver: no limitations Caregiver Availability: 24/7 Discharge Plan Discussed with Primary Caregiver: Yes Is Caregiver In Agreement with Plan?: Yes  Does Caregiver/Family have Issues with Lodging/Transportation while Pt is in Rehab?: No (husband stays with her 24/7 here in hospital) Pt stays with pt 24/7 in hospital  Goals/Additional Needs Patient/Family Goal for Rehab: Mod I to supervision with PT, OT, and SLP Expected length of stay: [facility] 9-12 days Pt/Family Agrees to Admission and willing to participate: No Program Orientation Provided & Reviewed with Pt/Caregiver Including Roles  &  Responsibilities: Yes  Decrease burden of Care through IP rehab admission: n/a  Possible need for SNF placement upon discharge:not anticipated  Patient Condition: This patient's condition remains as documented in the consult dated 01/28/2016, in which the Rehabilitation Physician determined and documented that the patient's condition is appropriate for intensive rehabilitative care in an inpatient rehabilitation facility. Will admit to inpatient rehab today.  Preadmission Screen Completed By:  Cleatrice Burke, 01/29/2016 10:37 AM ______________________________________________________________________   Discussed status with Dr. Naaman Plummer on 01/29/16 at 1037 and received telephone approval for admission today.  Admission Coordinator:  Cleatrice Burke, time [id number] Date 01/29/2016

## 2016-01-29 NOTE — Progress Notes (Signed)
[name], MD Physician Signed Physical Medicine and Rehabilitation Consult Note 01/27/2016 11:28 AM  Related encounter: ED to Hosp-Admission (Current) from 01/25/2016 in [place] Collapse All        Physical Medicine and Rehabilitation Consult Reason for Consult: Left MCA infarct Referring Physician: Dr.Xu   HPI: Adrienne [name] is a 82 y.o.right handed female with history of CAD, atrial fibrillation with chronic Coumadin. Patient lives with spouse. One level home. Used a occasional cane prior to admission. Patient recently relocated from [facility] to [place]. Recently admitted 01/07/2016 with intermittent bloody stools and upper abdominal pain. CT showed distal diverticulosis otherwise unrevealing. MRI of the spine showed T12 compression fracture as well as some disc protrusions. INR admission of 2.5 and received vitamin K. Hemoglobin 13-9. Underwent upper endoscopy per gastroenterology that showed small hiatal hernia. A medium nonbleeding diverticulum found in the area of the papilla. Patient's anticoagulation remained on hold and was discharged to home 01/10/2016 and plan to follow up outpatient with GI. Presented 01/25/2016 with right-sided weakness after being found down in the bathroom by her husband. Her Coumadin had recently been restarted one week prior. INR on admission 1.5. CT showed atrophy small vessel disease in the periventricular white matter. Evidence of prior infarct in the lateral left lentiform nucleus. CT angiogram head and neck positive for emergent large vessel occlusion at the left MCA bifurcation. Superimposed atherosclerotic stenosis left ICA origin left supraclinoid ICA. Marland Kitchen Underwent revascularization of occluded left MCA per interventional radiology. Maintain for a short time on ventilator. MRI showed left MCA territory infarct. Echocardiogram with ejection fraction of 65% no wall motion abnormalities.  Neurology consulted presently maintained on aspirin therapy. No current plan to resume Coumadin at this time versus Eliquis. Physical therapy evaluation completed 01/27/2016 with recommendations of physical medicine rehabilitation consult.  Patient has no pain complaints. Her speech is fluent  Review of Systems  Constitutional: Negative for fever and chills.  HENT: Negative for hearing loss.  Eyes: Negative for blurred vision and double vision.  Respiratory: Negative for cough and shortness of breath.  Cardiovascular: Positive for palpitations and leg swelling.  Gastrointestinal: Positive for abdominal pain, diarrhea and constipation. Negative for nausea.  Genitourinary: Positive for urgency. Negative for dysuria and hematuria.  Musculoskeletal: Positive for back pain and falls.  Neurological: Positive for weakness. Negative for seizures and headaches.  All other systems reviewed and are negative.  Past Medical History  Diagnosis Date  . Afib ([place])   . Elevated uric acid in blood   . Arthritis of ankle   . Pulmonary HTN ([place])   . Long-term (current) use of anticoagulants   . Moderate mitral regurgitation   . CAD (coronary artery disease)   . Edema   . Cancer [facility]) 2012    breast cancer-right  . Hypertension   . Urinary incontinence   . TIA (transient ischemic attack) 09/2011  . Diverticulosis   . Compression fracture of T12 vertebra [facility])    Past Surgical History  Procedure Laterality Date  . Cardiac catheterization  2013  . Replacement total knee bilateral    . Gallbladder surgery    . Appendectomy    . Mastectomy Right   . Cataract extraction, bilateral    . Breast surgery  2012    Rt.mastectomy--[place], [place]  . Tonsillectomy and adenoidectomy      as a child  . Dilation and curettage of uterus  in her early 60's for DUB  . Esophagogastroduodenoscopy Left  01/08/2016    Procedure: ESOPHAGOGASTRODUODENOSCOPY (EGD); Surgeon: Arta Silence, MD; Location: [facility]; Service: Endoscopy; Laterality: Left;  . Radiology with anesthesia N/A 01/25/2016    Procedure: RADIOLOGY WITH ANESTHESIA; Surgeon: Luanne Bras, MD; Location: [facility]; Service: Radiology; Laterality: N/A;   Family History  Problem Relation Age of Onset  . Asthma Mother   . CVA Father   . Atrial fibrillation Sister     HAS PACER  . Pneumonia Brother   . Cancer Brother     BREAST CANCER  . Cancer Daughter     COLON CANCER   Social History:  reports that she has quit smoking. Her smoking use included Cigarettes. She does not have any smokeless tobacco history on file. She reports that she does not drink alcohol or use illicit drugs. Allergies:  Allergies  Allergen Reactions  . Amiodarone Hcl [Amiodarone]     AFIB  . Compazine [Prochlorperazine]     HYPER  . Other Other (See Comments)    "Kidney dye"--patient states this gave her severe shakes/chills  . Thorazine [Chlorpromazine]     HYPER  . Valium [Diazepam]     HYPER   . Zometa [Zoledronic Acid]     PROBLEMS WITH EYES   Medications Prior to Admission  Medication Sig Dispense Refill  . acetaminophen (TYLENOL) 500 MG tablet Take 500-1,000 mg by mouth every 12 (twelve) hours as needed (pain).     Marland Kitchen anastrozole (ARIMIDEX) 1 MG tablet Take 1 mg by mouth daily.    Marland Kitchen CALCIUM PO Take 1 tablet by mouth daily at 3 pm.    . Cholecalciferol (VITAMIN D PO) Take 1 tablet by mouth daily.     Marland Kitchen diltiazem (CARDIZEM) 60 MG tablet Take 1 tablet (60 mg total) by mouth 3 (three) times daily. (Patient taking differently: Take 60 mg by mouth 3 (three) times daily. ) 270 tablet 3  . HYDROcodone-acetaminophen (NORCO/VICODIN) 5-325 MG tablet Take 1 tablet by mouth every 8 (eight) hours as needed for moderate  pain.    . metoprolol (LOPRESSOR) 100 MG tablet Take 1 tablet (100 mg total) by mouth 2 (two) times daily. 180 tablet 3  . omeprazole (PRILOSEC) 40 MG capsule Take 40 mg by mouth daily.     Marland Kitchen trimethoprim (TRIMPEX) 100 MG tablet Take 100 mg by mouth daily.   11  . warfarin (COUMADIN) 4 MG tablet Take 4 mg by mouth daily at 3 pm. Name Brand      Home: Home Living Family/patient expects to be discharged to:: Private residence Living Arrangements: Spouse/significant other Available Help at Discharge: Family Type of Home: House (duplex) Home Access: Stairs to enter [employer] of Steps: 1 stoop Entrance Stairs-Rails: None Home Layout: One level Bathroom Shower/Tub: Insurance claims handler: Civil engineer, contracting - built in, Engineer, manufacturing held shower head, Radio producer - single point  Functional History: Prior Function Level of Independence: Needs assistance Gait / Transfers Assistance Needed: per family, husband would carry her purse and provide his arm for balance during community gait.  Functional Status:  Mobility: Bed Mobility Overal bed mobility: Needs Assistance Bed Mobility: Supine to Sit Supine to sit: Min assist General bed mobility comments: Min assist to help pt steady herself to get to EOB.  Transfers Overall transfer level: Needs assistance Equipment used: 2 person hand held assist Transfers: Sit to/from Stand Sit to Stand: +2 safety/equipment, Min assist, Mod assist General transfer comment: Up to mod assist, especially  once fatigued to help safely lower to the chair and ensure she was lined up with sitting surface before sitting down, pt tried to sit very prematurely. Min assist to get to standing wtih heavy reliance on bil LEs resting against bed for balance.  Ambulation/Gait Ambulation/Gait assistance: +2 physical assistance, Min assist, Mod assist Ambulation Distance (Feet): 10 Feet Assistive device: 2 person hand held assist Gait  Pattern/deviations: Step-through pattern, Staggering right, Leaning posteriorly General Gait Details: Pt min assist to ambulate forward with one LOB to the right. Mod assist when attempting to back up to the chair to sit down to prevent posterior LOB and premature sit.     ADL:    Cognition: Cognition Overall Cognitive Status: Impaired/Different from baseline Orientation Level: Oriented to person, Oriented to place, Oriented to time, Oriented to situation Cognition Arousal/Alertness: Awake/alert Behavior During Therapy: WFL for tasks assessed/performed Overall Cognitive Status: Impaired/Different from baseline Area of Impairment: Orientation Orientation Level: Time  Blood pressure 169/104, pulse 86, temperature 97.7 F (36.5 C), temperature source Oral, resp. rate 19, height 5\' 7"  (1.702 m), weight 66.3 kg (146 lb 2.6 oz), last menstrual period 09/12/1973, SpO2 99 %. Physical Exam  Constitutional: She is oriented to person, place, and time. She appears well-developed.  HENT:  Head: Normocephalic.  Eyes: EOM are normal.  Neck: Normal range of motion. Neck supple. No thyromegaly present.  Cardiovascular:  Cardiac rate controlled  Respiratory: Effort normal and breath sounds normal.  GI: Soft. Bowel sounds are normal. She exhibits no distension.  Neurological: She is alert and oriented to person, place, and time.  Follows simple commands  Skin:  Multi bruises to extremeties  Motor strength is 4/5 bilateral deltoid, biceps, triceps, grip, hip flexor, knee extensor, ankle dorsiflexor and plantar flexor She has no pain with hip or knee range of motion. She is full shoulder range of motion. She is evidence of osteoarthritis in bilateral first carpometacarpal joints. Sensation intact to light touch in bilateral upper and lower limbs Speech without evidence of aphasia Visual fields are intact confrontation testing. She does complain of some double vision when looking straight ahead  but this is not present with lateral gaze. There is no evidence of nystagmus. No extraocular muscle weakness   Lab Results Last 24 Hours    Results for orders placed or performed during the hospital encounter of 01/25/16 (from the past 24 hour(s))  Glucose, capillary Status: Abnormal   Collection Time: 01/26/16 4:05 PM  Result Value Ref Range   Glucose-Capillary 103 (H) 65 - 99 mg/dL  Glucose, capillary Status: None   Collection Time: 01/27/16 12:02 AM  Result Value Ref Range   Glucose-Capillary 98 65 - 99 mg/dL  CBC Status: Abnormal   Collection Time: 01/27/16 3:40 AM  Result Value Ref Range   WBC 11.8 (H) 4.0 - 10.5 K/uL   RBC 3.03 (L) 3.87 - 5.11 MIL/uL   Hemoglobin 8.9 (L) 12.0 - 15.0 g/dL   HCT 27.3 (L) 36.0 - 46.0 %   MCV 90.1 78.0 - 100.0 fL   MCH 29.4 26.0 - 34.0 pg   MCHC 32.6 30.0 - 36.0 g/dL   RDW 14.8 11.5 - 15.5 %   Platelets 314 150 - 400 K/uL  Basic metabolic panel Status: Abnormal   Collection Time: 01/27/16 3:40 AM  Result Value Ref Range   Sodium 143 135 - 145 mmol/L   Potassium 3.8 3.5 - 5.1 mmol/L   Chloride 112 (H) 101 - 111 mmol/L   CO2 22  22 - 32 mmol/L   Glucose, Bld 90 65 - 99 mg/dL   BUN 9 6 - 20 mg/dL   Creatinine, Ser 0.75 0.44 - 1.00 mg/dL   Calcium 8.3 (L) 8.9 - 10.3 mg/dL   GFR calc non Af Amer >60 >60 mL/min   GFR calc Af Amer >60 >60 mL/min   Anion gap 9 5 - 15  Glucose, capillary Status: None   Collection Time: 01/27/16 8:25 AM  Result Value Ref Range   Glucose-Capillary 67 65 - 99 mg/dL   Comment 1 Notify RN    Comment 2 Document in Chart   Glucose, capillary Status: Abnormal   Collection Time: 01/27/16 9:05 AM  Result Value Ref Range   Glucose-Capillary 63 (L) 65 - 99 mg/dL   Comment 1 Notify RN    Comment 2 Document in Chart   Glucose, capillary  Status: None   Collection Time: 01/27/16 9:38 AM  Result Value Ref Range   Glucose-Capillary 79 65 - 99 mg/dL      Imaging Results (Last 48 hours)    Ct Head Wo Contrast  01/25/2016 CLINICAL DATA: Stroke. Left MCA occlusion status post endovascular revascularization. EXAM: CT HEAD WITHOUT CONTRAST TECHNIQUE: Contiguous axial images were obtained from the base of the skull through the vertex without intravenous contrast. COMPARISON: Head CT and CTA earlier today FINDINGS: Residual intravascular contrast is present related to recent angiogram. There is subtly decreased gray-white differentiation with slight early sulcal effacement involving the left temporoparietal region in the posterior MCA distribution consistent with early infarct. Minimal contrast staining and some retained intravascular contrast are noted in this region. No parenchymal hematoma is seen. There is no midline shift or extra-axial fluid collection. Mild cerebral atrophy and advanced chronic small vessel ischemic changes are again noted as well as a chronic right parietal cortical infarct. There is a dilated perivascular space versus chronic lacunar infarct in the inferior left putamen. Prior bilateral cataract extraction is noted. The visualized paranasal sinuses and mastoid air cells are clear. No acute osseous abnormality is identified. Calcified atherosclerosis is noted at the skullbase, and there are left TMJ joint degenerative changes. IMPRESSION: Early changes of acute posterior left MCA infarct. No parenchymal hematoma. Electronically Signed By: Logan Bores M.D. On: 01/25/2016 12:16   Mr Brain Wo Contrast  01/27/2016 CLINICAL DATA: 82 year old female who presented with emergent large vessel occlusion at the left MCA bifurcation status post Neuro-intervention, clot retrieval. Initial encounter. EXAM: MRI HEAD WITHOUT CONTRAST TECHNIQUE: Multiplanar, multiecho pulse sequences of the brain and surrounding  structures were obtained without intravenous contrast. COMPARISON: Post intervention head CT 01/25/2016, and earlier. FINDINGS: A small volume of cortical restricted diffusion is noted along the left sylvian fissure including the posterior left insula (series 4, image 22), as well as in the more posterior left temporal lobe and inferior parietal lobe. There is minimal posterior left periventricular white matter restriction. Occasional punctate areas of restricted diffusion over both superior convexities (e.g. Series 4, image 41), but no other confluent restricted diffusion. Major intracranial vascular flow voids are preserved. No acute intracranial hemorrhage identified. No midline shift, mass effect, evidence of mass lesion, ventriculomegaly, or extra-axial collection. Cervicomedullary junction and pituitary are within normal limits. Patchy bilateral cerebral white matter T2 and FLAIR hyperintensity. Small chronic cortically based infarct in the right parietal lobe. Deep gray matter nuclei, brainstem, and cerebellum are normal for age. Visible internal auditory structures appear normal. Negative visualized cervical spine. Postoperative changes to the globes. Otherwise negative  orbit and scalp soft tissues. Visualized bone marrow signal is within normal limits. IMPRESSION: 1. Only a small volume of left MCA territory infarction occurred, primarily along the left sylvian fissure. No hemorrhage or mass effect. 2. Occasional other punctate areas of restricted diffusion over both superior convexities, probably neuro-intervention related. Electronically Signed By: Genevie Ann M.D.  On: 01/27/2016 07:14   Dg Chest Port 1 View  01/26/2016 CLINICAL DATA: Respiratory failure EXAM: PORTABLE CHEST 1 VIEW COMPARISON: None. FINDINGS: Cardiomediastinal silhouette is stable. No acute infiltrate or pleural effusion. No pulmonary edema. NG tube in place. Endotracheal tube in place with tip 4.3 cm above the carina.  Atherosclerotic calcifications of thoracic aorta. Probable chronic mild interstitial prominence. No pneumothorax. IMPRESSION: No active disease. Probable chronic mild interstitial prominence. Endotracheal and NG tube in place. No pneumothorax. Electronically Signed By: Lahoma Crocker M.D. On: 01/26/2016 11:18     Assessment/Plan: Diagnosis: Left MCA infarct with gait disorder 1. Does the need for close, 24 hr/day medical supervision in concert with the patient's rehab needs make it unreasonable for this patient to be served in a less intensive setting? Yes 2. Co-Morbidities requiring supervision/potential complications: History of recent GI bleed 3. Due to bladder management, bowel management, safety, skin/wound care, disease management, medication administration, pain management and patient education, does the patient require 24 hr/day rehab nursing? Yes 4. Does the patient require coordinated care of a physician, rehab nurse, PT (1-2 hrs/day, 5 days/week), OT (1-2 hrs/day, 5 days/week) and SLP (.5-1 hrs/day, 5 days/week) to address physical and functional deficits in the context of the above medical diagnosis(es)? Yes Addressing deficits in the following areas: balance, endurance, locomotion, strength, transferring, bowel/bladder control, bathing, dressing, feeding, grooming, toileting and cognition 5. Can the patient actively participate in an intensive therapy program of at least 3 hrs of therapy per day at least 5 days per week? Yes 6. The potential for patient to make measurable gains while on inpatient rehab is excellent 7. Anticipated functional outcomes upon discharge from inpatient rehab are modified independent and supervision with PT, modified independent and supervision with OT, modified independent with SLP. 8. Estimated rehab length of stay to reach the above functional goals is: 9-12d 9. Does the patient have adequate social supports and living environment to accommodate these  discharge functional goals? Yes 10. Anticipated D/C setting: Home 11. Anticipated post D/C treatments: [place] therapy 12. Overall Rehab/Functional Prognosis: excellent  RECOMMENDATIONS: This patient's condition is appropriate for continued rehabilitative care in the following setting: CIR Patient has agreed to participate in recommended program. Yes and Potentially Note that insurance prior authorization may be required for reimbursement for recommended care.  Comment:     01/27/2016       Revision History     Date/Time User Provider Type Action   01/27/2016 12:22 PM [name], MD Physician Sign   01/27/2016 12:12 PM Cathlyn Parsons, PA-C Physician Assistant Pend   View Details Report       Routing History     Date/Time From To Method   01/27/2016 12:22 PM [name], MD Kathyrn Lass, MD Fax

## 2016-01-29 NOTE — Progress Notes (Signed)
OT Cancellation Note  Patient Details Name: Adrienne [name] MRN: [id number] DOB: [birth year]   Cancelled Treatment:    Reason Eval/Treat Not Completed: Other (comment) (Plan for transfer to CIR today; will defer OT to next venue).  [name] M.S., OTR/L Pager: [phone]  01/29/2016, 1:30 PM

## 2016-01-29 NOTE — Progress Notes (Signed)
I have an inpt rehab bed available to admit pt to today. I contacted Dr. Erlinda Hong and he is aware and in agreement. I met with pt and her spouse at bedside and they are in agreement. I will make the arrangements to admit today. [phone]

## 2016-01-29 NOTE — Progress Notes (Signed)
Stroke Discharge Summary  Patient ID: Adrienne [name]   MRN: [id number]      DOB: 1927-11-29  Date of Admission: 01/25/2016 Date of Discharge: 01/29/2016  Attending Physician:  Rosalin Hawking, MD, Stroke MD Consulting Physician(s):    Rush Landmark, MD (CCM), Willaim Rayas Nicole Kindred) Estanislado Pandy, MD (Interventional Neuroradiologist), Alysia Penna, MD (Physical Medicine & Rehabtilitation)  Patient's PCP:  Tawanna Solo, MD  Discharge Diagnoses:  Principal Problem:   Cerebral infarction due to thrombosis of left middle cerebral artery [facility]) s/p mechanical thrombectomy Active Problems:   Chronic anticoagulation   Hyperlipidemia   History of stroke   Coronary artery disease due to lipid rich plaque   Fall   Secondary hypertension, unspecified   Gait disturbance, post-stroke   Persistent atrial fibrillation ([place])   Gastrointestinal hemorrhage associated with intestinal diverticulosis   Diplopia   GERD (gastroesophageal reflux disease)   Past Medical History  Diagnosis Date  . Afib ([place])   . Elevated uric acid in blood   . Arthritis of ankle   . Pulmonary HTN ([place])   . Long-term (current) use of anticoagulants   . Moderate mitral regurgitation   . CAD (coronary artery disease)   . Edema   . Cancer [facility]) 2012    breast cancer-right  . Hypertension   . Urinary incontinence   . TIA (transient ischemic attack) 09/2011  . Diverticulosis   . Compression fracture of T12 vertebra [facility])    Past Surgical History  Procedure Laterality Date  . Cardiac catheterization  2013  . Replacement total knee bilateral    . Gallbladder surgery    . Appendectomy    . Mastectomy Right   . Cataract extraction, bilateral    . Breast surgery  2012    Rt.mastectomy--[place]  . Tonsillectomy and adenoidectomy      as a child  . Dilation and curettage of uterus      in her early 50's for DUB  . Esophagogastroduodenoscopy Left 01/08/2016    Procedure: ESOPHAGOGASTRODUODENOSCOPY (EGD);   Surgeon: Arta Silence, MD;  Location: [facility];  Service: Endoscopy;  Laterality: Left;  . Radiology with anesthesia N/A 01/25/2016    Procedure: RADIOLOGY WITH ANESTHESIA;  Surgeon: Luanne Bras, MD;  Location: [place];  Service: Radiology;  Laterality: N/A;    Medications to be continued on Rehab . anastrozole  1 mg Oral Daily  . apixaban  5 mg Oral BID  . atorvastatin  20 mg Oral q1800  . diltiazem  60 mg Oral Q8H  . feeding supplement (ENSURE ENLIVE)  237 mL Oral BID BM  . metoprolol tartrate  100 mg Oral BID  . pantoprazole  40 mg Oral Daily    LABORATORY STUDIES CBC    Component Value Date/Time   WBC 11.5* 01/29/2016 0527   RBC 3.44* 01/29/2016 0527   HGB 9.8* 01/29/2016 0527   HCT 31.5* 01/29/2016 0527   PLT 346 01/29/2016 0527   MCV 91.6 01/29/2016 0527   MCH 28.5 01/29/2016 0527   MCHC 31.1 01/29/2016 0527   RDW 14.8 01/29/2016 0527   LYMPHSABS 0.9 01/26/2016 0300   MONOABS 0.1 01/26/2016 0300   EOSABS 0.0 01/26/2016 0300   BASOSABS 0.0 01/26/2016 0300   CMP    Component Value Date/Time   NA 140 01/29/2016 0527   K 4.5 01/29/2016 0527   CL 105 01/29/2016 0527   CO2 25 01/29/2016 0527   GLUCOSE 96 01/29/2016 0527   BUN 14 01/29/2016 0527  CREATININE 0.66 01/29/2016 0527   CALCIUM 8.7* 01/29/2016 0527   PROT 6.4* 01/25/2016 0807   ALBUMIN 3.3* 01/25/2016 0807   AST 18 01/25/2016 0807   ALT 12* 01/25/2016 0807   ALKPHOS 81 01/25/2016 0807   BILITOT 0.4 01/25/2016 0807   GFRNONAA >60 01/29/2016 0527   GFRAA >60 01/29/2016 0527   COAGS Lab Results  Component Value Date   INR 1.67* 01/26/2016   INR 1.46 01/25/2016   INR 1.25 01/08/2016   Lipid Panel    Component Value Date/Time   CHOL 143 01/26/2016 0300   TRIG 101 01/26/2016 0300   HDL 43 01/26/2016 0300   CHOLHDL 3.3 01/26/2016 0300   VLDL 20 01/26/2016 0300   LDLCALC 80 01/26/2016 0300   HgbA1C  Lab Results  Component Value Date   HGBA1C 5.6 01/26/2016   Urinalysis     Component Value Date/Time   COLORURINE YELLOW 01/25/2016 1856   APPEARANCEUR CLEAR 01/25/2016 1856   LABSPEC 1.035* 01/25/2016 1856   PHURINE 7.0 01/25/2016 1856   GLUCOSEU NEGATIVE 01/25/2016 1856   HGBUR NEGATIVE 01/25/2016 1856   BILIRUBINUR NEGATIVE 01/25/2016 1856   KETONESUR NEGATIVE 01/25/2016 1856   PROTEINUR NEGATIVE 01/25/2016 1856   NITRITE NEGATIVE 01/25/2016 1856   LEUKOCYTESUR NEGATIVE 01/25/2016 1856   Urine Drug Screen     Component Value Date/Time   LABOPIA NONE DETECTED 01/25/2016 1855   COCAINSCRNUR NONE DETECTED 01/25/2016 1855   LABBENZ NONE DETECTED 01/25/2016 1855   AMPHETMU NONE DETECTED 01/25/2016 1855   THCU NONE DETECTED 01/25/2016 1855   LABBARB NONE DETECTED 01/25/2016 1855    Alcohol Level    Component Value Date/Time   ETH <5 01/25/2016 0807     SIGNIFICANT DIAGNOSTIC STUDIES  Ct Head Wo Contrast 01/25/2016 Early changes of acute posterior left MCA infarct. No parenchymal hematoma. 01/25/2016 Atrophy with small vessel disease in the periventricular white matter. Prior infarcts as noted above. No acute appearing infarct is evident. No hemorrhage or mass effect. ADDENDUM Left hemisphere ASPECTS score = 9; small area of cortical hypodensity in the left M2 as seen on series 21, image 14.had given verbal preliminary ASPECTS score of "10"   Ct Angio Head & Neck W/cm &/or Wo/cm 01/25/2016 1. Positive for Emergent Large Vessel Occlusion at The left MCA bifurcation. Abnormal left MCA perfusion with noncontrast head CT and CT Brain Perfusion parameters favorable for clot retrieval. 2. Superimposed atherosclerotic stenosis at the left ICA origin (60-65%) and left supraclinoid ICA siphon (moderate) related to calcified plaque. 3. Superimposed mild to moderate second and third order branch intracranial atherosclerosis including in the bilateral ACA, right MCA, and left PCA branches. 4. Right carotid bifurcation calcified plaque without stenosis.   Ct  C-spine No Charge 01/25/2016 Multilevel degenerative change without acute abnormality.   Ct Cerebral Perfusion W/cm 01/25/2016 1. Positive for Emergent Large Vessel Occlusion at The left MCA bifurcation. Abnormal left MCA perfusion with noncontrast head CT and CT Brain Perfusion parameters favorable for clot retrieval.   Cerebral angiogram 01/25/2016 bilateral common carotid arteriograms followed by complete revascularization of Occluded Lt MCA With x 1 pass with Solitaire 4 mmx 40 mm Retrieval device and 4 mg of superselective IA integrelin with TICI 3 revascularization.  MRI Brain Wo Contrast 01/27/2016 Only a small volume of left MCA territory infarction occurred, primarily along the left sylvian fissure. No hemorrhage or mass effect. 2. Occasional other punctate areas of restricted diffusion over both superior convexities, probably neuro-intervention related.  CXR 01/26/2016 No active disease.  Probable chronic mild interstitial prominence. Endotracheal and NG tube in place. No pneumothorax.  2D echocardiogram - Left ventricle: The cavity size was normal. Systolic function was  normal. The estimated ejection fraction was in the range of 60% to 65%. Wall motion was normal; there were no regional wall motion abnormalities. The study is not technically sufficient to allow evaluation of LV diastolic function. - Aortic valve: Valve mobility was mildly restricted. Transvalvular velocity was within the normal range. There was no stenosis. There was trivial regurgitation. - Mitral valve: Calcified annulus. Transvalvular velocity was within the normal range. There was no evidence for stenosis. There was trivial regurgitation. - Left atrium: The atrium was moderately dilated. - Right ventricle: The cavity size was normal. Wall thickness was normal. Systolic function was normal. - Atrial septum: No defect or patent foramen ovale was identified by color flow Doppler. - Tricuspid valve: There was mild  regurgitation. - Pulmonary arteries: Systolic pressure was within the normal range. PA peak pressure: 29 mm Hg (S).     HISTORY OF PRESENT ILLNESS Adrienne [name] is an 82 y.o. female patient who was brought into the emergency room with right sided weakness, aphasia, post fall. History provided by her husband. Patient is aphasic and unable to provide any history. The patient woke up around 6:15 AM and patient was in her normal state of health, no weakness no speech problems at that time. Last seen normal by husband at 7 AM 01/25/2016.  The patient went to take shower and she appeared to fall in the bathroom and got some glass on her feet during the fall. When he checked on her after the fall, she was noted to have right-sided weakness, unable to respond to commands. Hence she was brought into the ER for further evaluation and stroke code was called by the EMS.  Patient has a history of atrial fibrillation, on Coumadin. She was admitted for GI bleed evaluation with a drop in hematocrit 3 weeks ago and after completion of extensive GI workup, Coumadin was restarted 1 week ago. Her INR on arrival was 1.5. NIHSS 23. mRS 0.  Patient was not administered IV t-PA secondary to recent GI bleed less than 3 weeks ago, on coumadin. She was admitted to the neuro ICU for further evaluation and treatment.    HOSPITAL COURSE Adrienne [name] is a 82 y.o. female with history of atrial fibrillation on coumadin INR 1.5 with GIB 3 weeks ago presenting with R sided weakness and receptive phasia following a fall. She did not receive IV t-PA per documenation due to hx GIB and INR 1.5. CTA showed a L M2 occlusion. She was taken to IR with complete TICI 3 revascularization of L MCA with Solitaire and IA integrelin.   Stroke: Dominant left MCA infarct s/p TICI3 revascularization of L MCA M2 occlusion with mechanical thrombectomy and IA integrelin. Infarct embolic secondary to known atrial fibrillation in setting of  subtherapeutic coumadin.  Resultant Right side weakness- Significantly improved  CTA Head & neck L MCA bifurcation occlusion, L ICA origin 60-65% stenosis, L ICA siphon moderate plaque. Intracranial atherosclerosis B ACA, R MCA, L PCA branches  CT perfusion L MCA perfusion deficit favorable for clot retrieval  Cerebral angiogram L MCA occlusion s/p TICI3 revascularization with Solitaire and IA integrelin, no left ICA origin stenosis  MRI - Only a small volume of left MCA territory infarction occurred.  2D Echo EF 60-65%   LDL 80  HgbA1c 5.6  warfarin daily prior to admission  INR 1.5 on arrival, changed to eliquis  Ongoing aggressive stroke risk factor management  Therapy recommendations: CIR   Disposition: CIR  Atrial Fibrillation  Home anticoagulation: coumadin  INR 1.5 on admission  Changed to Eliquis (apixaban) daily, started 5/17   Rate controlled on PO diltiazem 60mg  Q8H, switched to CR am, cont 100mg  BID of metop  Recent GI bleeding  Required PRBC transfusion  baseline 10-11  As low as 8.9 during hospitalization, now uo to 11.5  Coumadin was discontinued due to hx LGIB  changed to eliquis due to lower bleeding risk profile  Monitor H&H  Hypertension  Stable  Long term BP goal is normotensive  Hyperlipidemia  Home meds: No statin  LDL 80, goal < 70  Started lipitor at 20mg  daily  Continue statin at discharge  Other Stroke Risk Factors  Advanced age  Former Cigarette smoker  Hx stroke/TIA  Coronary artery disease  Other Active Problems  R breast cancer 2012 on anastrozole  GERD on PPI   DISCHARGE EXAM Blood pressure 140/60, pulse 71, temperature 98 F (36.7 C), temperature source Oral, resp. rate 18, height 5\' 7"  (1.702 m), weight 66.3 kg (146 lb 2.6 oz), last menstrual period 09/12/1973, SpO2 96 %.   General - Well nourished, well developed, not in distress.  Ophthalmologic - Fundi not  examined.  Cardiovascular - irregularly irregular heart rate and rhythm.  Mental Status -  Level of arousal and orientation to time, place, and person were intact. Language including expression, naming, repetition, comprehension was assessed and found intact. Attention span and concentration were normal. Fund of Knowledge was assessed and was intact.  Cranial Nerves II - XII - II - Visual field intact OU. III, IV, VI - Extraocular movements intact. V - Facial sensation intact bilaterally. VII - Facial movement intact bilaterally. VIII - Hearing & vestibular intact bilaterally. X - Palate elevates symmetrically. XI - Chin turning & shoulder shrug intact bilaterally. XII - Tongue protrusion intact.  Motor Strength - The patient's strength was normal in all extremities and pronator drift was absent.  Bulk was normal and fasciculations were absent.   Motor Tone - Muscle tone was assessed at the neck and appendages and was normal.  Reflexes - The patient's reflexes were 1+ in all extremities and she had no pathological reflexes.  Sensory - Light touch, temperature/pinprick were assessed and were symmetrical.    Coordination - The patient had normal movements in the hands with no ataxia or dysmetria.  Tremor was absent.  Gait and Station - deferred to PT    Discharge Diet  Diet regular Room service appropriate?: Yes; Fluid consistency:: Thin liquids  DISCHARGE PLAN  Disposition:  Transfer to [place] for ongoing PT, OT and ST  Eliquis (apixaban) daily for secondary stroke prevention.  Recommend ongoing risk factor control by Primary Care Physician at time of discharge from inpatient rehabilitation.  Follow-up Tawanna Solo, MD in 2 weeks following discharge from rehab.  Follow-up with Dr. Rosalin Hawking,, Stroke Clinic in 2 months, office to schedule an appointment.   45 minutes were spent preparing discharge.  [place] for Pager information 01/29/2016 1:03 PM   I, the attending vascular neurologist, have personally obtained a history, examined the patient, evaluated laboratory data, individually viewed imaging studies and agree with radiology interpretations.  Together with the NP/PA, we formulated the assessment and plan of care which reflects our mutual decision.  I have made any additions  or clarifications directly to the above note and agree with the findings and plan as currently documented.   Pt doing well, neuro deficit resolved. On eliquis and no GIB. Close monitoring. CIR today. Follow up in clinic.  Rosalin Hawking, MD PhD Stroke Neurology 01/29/2016 1:51 PM

## 2016-01-29 NOTE — Progress Notes (Signed)
RN reviewed rehab booklet, schedule, safety plan, and expectations of rehab to patient and family. Family at bedside with call bell within reach and bed alarm on.

## 2016-01-29 NOTE — Clinical Social Work Note (Signed)
CSW received referral for SNF.  Case discussed with case manager and patient has been accepted into CIR for today.  CSW to sign off please re-consult if social work needs arise.  Jones Broom. [place], MSW, [place]

## 2016-01-29 NOTE — Progress Notes (Signed)
Physical Therapy Treatment Patient Details Name: Adrienne [name] MRN: [id number] DOB: 1928/03/05 Today's Date: 01/29/2016    History of Present Illness 82 y.o. female admitted to [facility] on [facility]/25/16 for right sided weakness, aphasia, and fall.  Pt dx with right sided weakness, aphasia, and fall.  Pt dx with left MCA infarct.  Pt underwent clot retrevial in IR  Intubated post proceedure 01/25/16-01/26/16.  Pt with significant PMHx of GIB, A-fib, arthritis of the ankle, pulmonary HTN, CAD, mild mitral regurg, CAD, CA, HTN, TIA, compression fx T12, Bil TKA, and R mastectomy.    PT Comments    Patient is making progress toward mobility goals and tolerated session well. Son present. Educated pt on keeping bilat LE elevated to aid in decreasing swelling. Current plan remains appropriate.   Follow Up Recommendations  CIR     Equipment Recommendations  Rolling walker with 5" wheels    Recommendations for Other Services Rehab consult     Precautions / Restrictions Precautions Precautions: Fall Precaution Comments: pt is unsteady on her feet, family reports this was the case PTA, but more now Restrictions Weight Bearing Restrictions: No    Mobility  Bed Mobility               General bed mobility comments: OOB in chair upon arrival  Transfers Overall transfer level: Needs assistance   Transfers: Sit to/from Stand Sit to Stand: Min guard         General transfer comment: from recliner; safe hand placement; min guard for safety  Ambulation/Gait Ambulation/Gait assistance: Min assist Ambulation Distance (Feet): 120 Feet Assistive device: Rolling walker (2 wheeled) Gait Pattern/deviations: Step-through pattern;Decreased step length - left;Decreased dorsiflexion - left;Trunk flexed Gait velocity: decreased   General Gait Details: slow, steady gait with decreaed L foot clearance; cues for increased L step length, L heel strike, and posture with pt able to correct with frequent  cues   Stairs            Wheelchair Mobility    Modified Rankin (Stroke Patients Only) Modified Rankin (Stroke Patients Only) Pre-Morbid Rankin Score: Slight disability Modified Rankin: Moderately severe disability     Balance   Sitting-balance support: No upper extremity supported;Feet supported Sitting balance-Leahy Scale: Fair Sitting balance - Comments: needs either foot or UE support   Standing balance support: Bilateral upper extremity supported;During functional activity Standing balance-Leahy Scale: Fair                      Cognition Arousal/Alertness: Awake/alert Behavior During Therapy: WFL for tasks assessed/performed Overall Cognitive Status: Within Functional Limits for tasks assessed (son present and pt able to answer questions correctly about PLOF)                      Exercises General Exercises - Lower Extremity Ankle Circles/Pumps: AROM;Both;20 reps;Seated Long Arc Quad: AROM;Strengthening;Both;15 reps;Seated Hip Flexion/Marching: Strengthening;Both;15 reps;Seated    General Comments General comments (skin integrity, edema, etc.): son present; educated pt on keeping bilat LE elevated to decrease swelling      Pertinent Vitals/Pain Pain Assessment: Faces Faces Pain Scale: Hurts little more Pain Location: R ankle  Pain Descriptors / Indicators: Sore Pain Intervention(s): Limited activity within patient's tolerance;Monitored during session;Repositioned    Home Living                      Prior Function            PT Goals (current  goals can now be found in the care plan section) Acute Rehab PT Goals Patient Stated Goal: go to rehab and get stronger Progress towards PT goals: Progressing toward goals    Frequency  Min 4X/week    PT Plan Current plan remains appropriate    Co-evaluation             End of Session Equipment Utilized During Treatment: Gait belt Activity Tolerance: Patient tolerated  treatment well Patient left: in chair;with call bell/phone within reach;with family/visitor present     Time: 1526-1550 PT Time Calculation (min) (ACUTE ONLY): 24 min  Charges:  $Gait Training: 8-22 mins $Therapeutic Exercise: 8-22 mins                    G Codes:      [place] April, PTA Pager: [phone]   01/29/2016, 4:03 PM

## 2016-01-29 NOTE — Progress Notes (Signed)
Report given to received nurse on 4W. Bed not currently ready, 4W RN to notify when bed available

## 2016-01-29 NOTE — Discharge Instructions (Signed)

## 2016-01-29 NOTE — Progress Notes (Signed)
Adrienne Gong, RN Rehab Admission Coordinator Signed Physical Medicine and Rehabilitation PMR Pre-admission 01/29/2016 9:42 AM  Related encounter: ED to Hosp-Admission (Current) from 01/25/2016 in [place] Collapse All   PMR Admission Coordinator Pre-Admission Assessment  Patient: Adrienne [name] is an 82 y.o., female MRN: [id number] DOB: 29-Dec-1927 Height: 5\' 7"  (170.2 cm) Weight: 66.3 kg (146 lb 2.6 oz)  Insurance Information HMO: PPO: PCP: IPA: 80/20: yes OTHER: no HMO PRIMARY: Medicare a and b Policy#: [id number] Subscriber: pt Benefits: Phone #: online Name: 5/19 Eff. Date: 12/12/94 Deduct: $1316 Out of Pocket Max: none Life Max: none CIR: 100% SNF: 20 full days Outpatient: 80% Co-Pay: 20% Home Health: 100% Co-Pay: none DME: 80% Co-Pay: 20% Providers: pt choice  SECONDARY: aarp medicare supplement Policy#: [id number] Subscriber: pt  Medicaid Application Date: Case Manager:  Disability Application Date: Case Worker:   Emergency [place]    Name Relation Home Work Mobile   [place] Spouse [phone]  [phone]   Pittman,Laurie Daughter   [phone]     Current Medical History  Patient Admitting Diagnosis: left MCA infarct  History of Present Illness: Adrienne [name] is a 82 y.o.right handed female with history of Right breast cancer, CAD, atrial fibrillation with chronic Coumadin. Patient lives with spouse. Recently admitted 01/07/2016 with intermittent bloody stools and upper abdominal pain. CT showed distal diverticulosis otherwise unrevealing. MRI of the spine showed T12 compression fracture as well as some disc protrusions.  INR admission of 2.5 and received vitamin K. Hemoglobin 13-9. Underwent upper endoscopy per gastroenterology that showed small hiatal hernia. A medium nonbleeding diverticulum found in the area of the papilla. Patient's anticoagulation remained on hold and was discharged to home 01/10/2016 and plan to follow up outpatient with GI. Presented 01/25/2016 with right-sided weakness after being found down in the bathroom by her husband. Her Coumadin had recently been restarted one week prior. INR on admission 1.5. CT showed atrophy small vessel disease in the periventricular white matter. Evidence of prior infarct in the lateral left lentiform nucleus. CT angiogram head and neck positive for emergent large vessel occlusion at the left MCA bifurcation. Superimposed atherosclerotic stenosis left ICA origin left supraclinoid ICA. Marland Kitchen Underwent revascularization of occluded left MCA per interventional radiology. Maintained for a short time on ventilator. MRI showed left MCA territory infarct. Echocardiogram with ejection fraction of 65% no wall motion abnormalities. Neurology consulted presently maintained on aspirin therapy and transition to Eliquis 01/28/2016 with no plan to resume Coumadin. Hemoglobin and hematocrit remained stable.   Total: 1 NIH    Past Medical History  Past Medical History  Diagnosis Date  . Afib ([place])   . Elevated uric acid in blood   . Arthritis of ankle   . Pulmonary HTN ([place])   . Long-term (current) use of anticoagulants   . Moderate mitral regurgitation   . CAD (coronary artery disease)   . Edema   . Cancer [facility]) 2012    breast cancer-right  . Hypertension   . Urinary incontinence   . TIA (transient ischemic attack) 09/2011  . Diverticulosis   . Compression fracture of T12 vertebra ([facility])     Family History  family history includes Asthma in her mother; Atrial fibrillation in her sister; CVA in her father; Cancer in her brother  and daughter; Pneumonia in her brother.  Prior Rehab/Hospitalizations:  Has the patient had major surgery during 100 days prior to admission? No  Current Medications  Current facility-administered medications:  . acetaminophen (TYLENOL) tablet 650 mg, 650 mg, Oral, Q6H PRN **OR** [DISCONTINUED] acetaminophen (TYLENOL) suppository 650 mg, 650 mg, Rectal, Q4H PRN, Ram Fuller Mandril, MD . anastrozole (ARIMIDEX) tablet 1 mg, 1 mg, Oral, Daily, Skeet Simmer, RPH, 1 mg at 01/29/16 1006 . apixaban (ELIQUIS) tablet 5 mg, 5 mg, Oral, BID, Romona Curls, RPH, 5 mg at 01/29/16 1006 . atorvastatin (LIPITOR) tablet 20 mg, 20 mg, Oral, q1800, Ejiroghene E Emokpae, MD, 20 mg at 01/28/16 1815 . diltiazem (CARDIZEM) tablet 60 mg, 60 mg, Oral, Q8H, Norman Clay, MD, 60 mg at 01/29/16 [id number] . feeding supplement (ENSURE ENLIVE) (ENSURE ENLIVE) liquid 237 mL, 237 mL, Oral, BID BM, Asencion Islam, RD, 237 mL at 01/28/16 1339 . labetalol (NORMODYNE,TRANDATE) injection 10 mg, 10 mg, Intravenous, Q10 min PRN, Ram Fuller Mandril, MD, 10 mg at 01/26/16 2329 . metoprolol (LOPRESSOR) tablet 100 mg, 100 mg, Oral, BID, Rosalin Hawking, MD, 100 mg at 01/29/16 1006 . pantoprazole (PROTONIX) EC tablet 40 mg, 40 mg, Oral, Daily, Rosalin Hawking, MD, 40 mg at 01/29/16 1006 . senna-docusate (Senokot-S) tablet 1 tablet, 1 tablet, Oral, QHS PRN, Ram Fuller Mandril, MD  Patients Current Diet: Diet regular Room service appropriate?: Yes; Fluid consistency:: Thin  Precautions / Restrictions Precautions Precautions: Fall Precaution Comments: pt is unsteady on her feet, family reports this was the case PTA, but more now Restrictions Weight Bearing Restrictions: No   Has the patient had 2 or more falls or a fall with injury in the past year?No  Prior Activity Level Community (5-7x/wk): Independent without AD. Does not drive. Holds to spouse arm when in community  Has not driven in years  due to lack of confidence. Spouse states noted LE weakness over past year. No falls. She cooks and she and spouse clean together. Married 68 years  Development worker, international aid / Paramedic Devices/Equipment: None Home Equipment: Shower seat - built in, Engineer, manufacturing held shower head, Kasandra Knudsen - single point  Prior Device Use: Indicate devices/aids used by the patient prior to current illness, exacerbation or injury? None of the above holds to spouse arm to walk in community  Prior Functional Level Prior Function Level of Independence: Needs assistance Gait / Transfers Assistance Needed: per family, husband would carry her purse and provide his arm for balance during community gait.  ADL's / Homemaking Assistance Needed: independent  Self Care: Did the patient need help bathing, dressing, using the toilet or eating? Independent  Indoor Mobility: Did the patient need assistance with walking from room to room (with or without device)? Independent  Stairs: Did the patient need assistance with internal or external stairs (with or without device)? Needed some help  Functional Cognition: Did the patient need help planning regular tasks such as shopping or remembering to take medications? Independent  Current Functional Level Cognition  Arousal/Alertness: Awake/alert Overall Cognitive Status: Within Functional Limits for tasks assessed (able to state her son was coming today and remembered what time his flight arrived. ) Orientation Level: Oriented X4 Attention: Alternating Alternating Attention: Appears intact Memory: Appears intact Awareness: Appears intact Problem Solving: Appears intact Executive Function: Reasoning, Sequencing, Organizing, Decision Making, Initiating, Self Monitoring, Self Correcting Reasoning: Appears intact Sequencing: Appears intact Organizing: Appears intact Decision Making: Appears intact Initiating: Appears intact Self Monitoring: Appears intact Self  Correcting: Appears intact Safety/Judgment: Appears intact [employer] Scales of Cognitive Functioning: Purposeful/appropriate   Extremity Assessment (includes Sensation/Coordination)  Upper Extremity Assessment: RUE deficits/detail  RUE Deficits / Details: R dominant and decr senstation. Decr awareness to R hand deficits RUE Coordination: decreased fine motor, decreased gross motor  Lower Extremity Assessment: Defer to PT evaluation    ADLs  Overall ADL's : Needs assistance/impaired Grooming: Wash/dry hands, Minimal assistance, Bed level Upper Body Bathing: Minimal assitance, Bed level Toilet Transfer: +2 for physical assistance, Moderate assistance Functional mobility during ADLs: Moderate assistance, +2 for physical assistance General ADL Comments: Pt reports dizziness and need to sit. BP stable and monitored. pt with visual deficits affecting all adls see below    Mobility  Overal bed mobility: Needs Assistance Bed Mobility: Supine to Sit Supine to sit: Supervision General bed mobility comments: SUpervision for safety. use of rail for support.     Transfers  Overall transfer level: Needs assistance Equipment used: 1 person hand held assist Transfers: Sit to/from Stand Sit to Stand: Min guard General transfer comment: Min guard for safety. Stood from [employer], from toilet x1. Transferred to chair post ambulation bout. CUes for controlled descent.     Ambulation / Gait / Stairs / Wheelchair Mobility  Ambulation/Gait Ambulation/Gait assistance: Museum/gallery curator (Feet): 100 Feet (x2 bouts) Assistive device: 1 person hand held assist, None Gait Pattern/deviations: Step-through pattern, Decreased stride length, Staggering right General Gait Details: Min A for balance throughout gait training. Cues for forward gaze. Staggering at times. 1 seated rest break. Gait velocity: decreased    Posture / Balance Dynamic Sitting Balance Sitting balance  - Comments: Able to change pad and perform pericare without assist.  Balance Overall balance assessment: Needs assistance Sitting-balance support: Feet supported, No upper extremity supported Sitting balance-Leahy Scale: Fair Sitting balance - Comments: Able to change pad and perform pericare without assist.  Standing balance support: During functional activity Standing balance-Leahy Scale: Fair Standing balance comment: Able to stand at sink and wash hands with close min guard.     Special needs/care consideration  Skin bilateral arm ecchymosis Bowel mgmt: continent. No BM documented this admit Bladder mgmt: some incontinence wears pad. Uses pessary for few years for bladder prolapse    Previous Home Environment Living Arrangements: Spouse/significant other Lives With: Spouse Available Help at Discharge: Family Type of Home: House Home Layout: One level Home Access: Stairs to enter Entrance Stairs-Rails: None Entrance Stairs-Number of Steps: 1 stoop Bathroom Shower/Tub: Multimedia programmer: [place]: No Additional Comments: pt with incr balance deficits very recently  Discharge Living Setting Plans for Discharge Living Setting: Patient's home, Lives with (comment) (spouse) Type of Home at Discharge: House Discharge Home Layout: One level Discharge Home Access: Stairs to enter Entrance Stairs-Rails: None Entrance Stairs-Number of Steps: 1 stoop Discharge Bathroom Shower/Tub: Walk-in shower Discharge Bathroom Toilet: Standard Does the patient have any problems obtaining your medications?: No  Social/Family/Support Systems Patient Roles: Spouse, Parent (3 children, daughter local) Sport and exercise psychologist Information: [name], spouse Anticipated Caregiver: spouse Anticipated Ambulance person Information: see above Ability/Limitations of Caregiver: no limitations Caregiver Availability: 24/7 Discharge Plan Discussed with Primary Caregiver: Yes Is  Caregiver In Agreement with Plan?: Yes Does Caregiver/Family have Issues with Lodging/Transportation while Pt is in Rehab?: No (husband stays with her 24/7 here in hospital) Pt stays with pt 24/7 in hospital  Goals/Additional Needs Patient/Family Goal for Rehab: Mod I to supervision with PT, OT, and SLP Expected length of stay: ELOS 9-12 days Pt/Family Agrees to Admission and willing to participate: No Program Orientation Provided & Reviewed with Pt/Caregiver Including Roles & Responsibilities: Yes  Decrease burden  of Care through IP rehab admission: n/a  Possible need for SNF placement upon discharge:not anticipated  Patient Condition: This patient's condition remains as documented in the consult dated 01/28/2016, in which the Rehabilitation Physician determined and documented that the patient's condition is appropriate for intensive rehabilitative care in an inpatient rehabilitation facility. Will admit to inpatient rehab today.  Preadmission Screen Completed By: Cleatrice Burke, 01/29/2016 10:37 AM ______________________________________________________________________  Discussed status with Dr. Naaman Plummer on 01/29/16 at 1037 and received telephone approval for admission today.  Admission Coordinator: Cleatrice Burke, time [id number] Date 01/29/2016          Cosigned by: Meredith Staggers, MD at 01/29/2016 11:32 AM  Revision History     Date/Time User Provider Type Action   01/29/2016 11:32 AM Meredith Staggers, MD Physician Cosign   01/29/2016 10:37 AM Adrienne Gong, RN Rehab Admission Coordinator Sign

## 2016-01-30 ENCOUNTER — Inpatient Hospital Stay (HOSPITAL_COMMUNITY): Payer: Medicare Other | Admitting: Physical Therapy

## 2016-01-30 ENCOUNTER — Inpatient Hospital Stay (HOSPITAL_COMMUNITY): Payer: Medicare Other | Admitting: Speech Pathology

## 2016-01-30 ENCOUNTER — Inpatient Hospital Stay (HOSPITAL_COMMUNITY): Payer: Medicare Other | Admitting: Occupational Therapy

## 2016-01-30 DIAGNOSIS — I63512 Cerebral infarction due to unspecified occlusion or stenosis of left middle cerebral artery: Secondary | ICD-10-CM

## 2016-01-30 NOTE — Evaluation (Signed)
Occupational Therapy Assessment and Plan  Patient Details  Name: Adrienne [name] MRN: [id number] Date of Birth: 1928-09-05  OT Diagnosis: muscle weakness (generalized) Rehab Potential: Rehab Potential (ACUTE ONLY): Good ELOS: 7-10- days  Today's Date: 01/30/2016 OT Individual Time: 3546-5681 OT Individual Time Calculation (min): 75 min     Problem List:  Patient Active Problem List   Diagnosis Date Noted  . Diplopia 01/29/2016  . GERD (gastroesophageal reflux disease) 01/29/2016  . Left middle cerebral artery stroke ([place]) 01/29/2016  . Persistent atrial fibrillation ([place])   . Gastrointestinal hemorrhage associated with intestinal diverticulosis   . Gait disturbance, post-stroke 01/27/2016  . Fall   . Secondary hypertension, unspecified   . Cerebral infarction due to thrombosis of left middle cerebral artery (HCC) s/p mechanical thrombectomy   . Malnutrition of moderate degree 01/09/2016  . GI bleed 01/06/2016  . Chronic atrial fibrillation ([place]) 06/10/2015  . Pulmonary hypertension, secondary ([place]) 06/10/2015  . Chronic anticoagulation 06/10/2015  . Hyperlipidemia 06/10/2015  . Essential hypertension 06/10/2015  . History of stroke 06/10/2015  . Coronary artery disease due to lipid rich plaque 06/10/2015  . Afib ([place])   . Elevated uric acid in blood   . Arthritis of ankle   . Pulmonary HTN ([place])     Past Medical History:  Past Medical History  Diagnosis Date  . Afib ([place])   . Elevated uric acid in blood   . Arthritis of ankle   . Pulmonary HTN ([place])   . Long-term (current) use of anticoagulants   . Moderate mitral regurgitation   . CAD (coronary artery disease)   . Edema   . Cancer [facility]) 2012    breast cancer-right  . Hypertension   . Urinary incontinence   . TIA (transient ischemic attack) 09/2011  . Diverticulosis   . Compression fracture of T12 vertebra [facility])    Past Surgical History:  Past Surgical History  Procedure Laterality Date  . Cardiac  catheterization  2013  . Replacement total knee bilateral    . Gallbladder surgery    . Appendectomy    . Mastectomy Right   . Cataract extraction, bilateral    . Breast surgery  2012    Rt[place]astectomy[place]  . Tonsillectomy and adenoidectomy      as a child  . Dilation and curettage of uterus      in her early 30's for DUB  . Esophagogastroduodenoscopy Left 01/08/2016    Procedure: ESOPHAGOGASTRODUODENOSCOPY (EGD);  Surgeon: Arta Silence, MD;  Location: [facility];  Service: Endoscopy;  Laterality: Left;  . Radiology with anesthesia N/A 01/25/2016    Procedure: RADIOLOGY WITH ANESTHESIA;  Surgeon: Luanne Bras, MD;  Location: [place];  Service: Radiology;  Laterality: N/A;    Assessment & Plan Clinical Impression   history ofRight breast cancer, CAD, atrial fibrillation with chronic Coumadin. Patient lives with spouse. One level home. Used a occasional cane prior to admission. Patient recently relocated from [name] to [place]. Recently admitted 01/07/2016 with intermittent bloody stools and upper abdominal pain. CT showed distal diverticulosis otherwise unrevealing. MRI of the spine showed T12 compression fracture as well as some disc protrusions. INR admission of 2.5 and received vitamin K. Hemoglobin 13-9. Underwent upper endoscopy per gastroenterology that showed small hiatal hernia. A medium nonbleeding diverticulum found in the area of the papilla. Patient's anticoagulation remained on hold and was discharged to home 01/10/2016 and plan to follow up outpatient with GI. Presented 01/25/2016 with right-sided weakness after being found down in the  bathroom by her husband. Her Coumadin had recently been restarted one week prior. INR on admission 1.5. CT showed atrophy small vessel disease in the periventricular white matter. Evidence of prior infarct in the lateral left lentiform nucleus. CT angiogram head and neck positive for emergent large vessel occlusion at the  left MCA bifurcation. Superimposed atherosclerotic stenosis left ICA origin left supraclinoid ICA. Marland Kitchen Underwent revascularization of occluded left MCA per interventional radiology. Maintained for a short time on ventilator. MRI showed left MCA territory infarct. Echocardiogram with ejection fraction of 65% no wall motion abnormalities. Neurology consulted presently maintained on aspirin therapy and transition to Eliquis 01/28/2016 with no plan to resume Coumadin. Hemoglobin and hematocrit remained stable. Physical And occupational therapy evaluations completed 01/27/2016 with recommendations of physical medicine .Marland Kitchen  Patient transferred to CIR on 01/29/2016 .    Patient currently requires min with basic self-care skills secondary to decreased cardiorespiratoy endurance and decreased coordination.  Prior to hospitalization, patient could complete BADL/IADL with independent .  Patient will benefit from skilled intervention to increase independence with basic self-care skills and increase level of independence with iADL prior to discharge home with care partner.  Anticipate patient will require intermittent supervision and follow up home health.  OT - End of Session Activity Tolerance: Tolerates 30+ min activity with multiple rests Endurance Deficit: Yes OT Assessment Rehab Potential (ACUTE ONLY): Good Barriers to Discharge:  (none) OT Plan OT Intensity: Minimum of 1-2 x/day, 45 to 90 minutes OT Frequency: 5 out of 7 days OT Duration/Estimated Length of Stay: 7-10- days OT Treatment/Interventions: Balance/vestibular training;Community reintegration;Discharge planning;DME/adaptive equipment instruction;Functional mobility training;Pain management;Patient/family education;Self Care/advanced ADL retraining;Therapeutic Activities;Therapeutic Exercise;UE/LE Strength taining/ROM OT Self Feeding Anticipated Outcome(s): independent OT Basic Self-Care Anticipated Outcome(s): mod I OT Toileting Anticipated  Outcome(s): mod I OT Bathroom Transfers Anticipated Outcome(s): mod I with toilet; SBA with shower OT Recommendation Recommendations for Other Services:  (none) Patient destination: Home Follow Up Recommendations: Home health OT Equipment Recommended: To be determined   Skilled Therapeutic Intervention Explained OT POC, intervention.  Pt agreed to shower.  Ambulated with HHA to toilet and shower.  Pt demonstrated safety and awareness during activity.  She verbalized her PMH with accuracy and her duties at home.  Husband and children present during session.  Pt wants to be independent with BADL and some IADL at discharge.  Addressed balance, transfers, strength and endurance.  Pt has SOB with activity but was able to continue with activity.  She as History of CHF.   OT Evaluation Precautions/Restrictions  Precautions Precautions: Fall Precaution Comments: pt is unsteady on her feet, family reports this was the case PTA, but more now Restrictions Weight Bearing Restrictions: No General   Vital Signs Therapy Vitals Temp: 97.5 F (36.4 C) Temp Source: Oral Pulse Rate: 80 Resp: 18 BP: (!) 160/68 mmHg Patient Position (if appropriate): Lying Oxygen Therapy SpO2: 100 % O2 Device: Not Delivered Pain Pain Assessment Pain Assessment: 0-10 Pain Score: 4  Pain Location: Ankle Pain Orientation: Right Home Living/Prior Functioning Home Living Available Help at Discharge: Family Type of Home: House Home Access: Stairs to enter Technical brewer of Steps: 1step,porch, 1 step Entrance Stairs-Rails: None Home Layout: One level Bathroom Shower/Tub: Gaffer, Door (2 built in seats) Biochemist, clinical: Associate Professor Accessibility: Yes Additional Comments: pt with incr balance deficits very recently  Lives With: Spouse IADL History Homemaking Responsibilities: Yes Meal Prep Responsibility: Secondary Laundry Responsibility: Secondary Cleaning Responsibility: Secondary  (dust and bathroom; husaband vacumns) Bill Paying/Finance Responsibility: Primary  Shopping Responsibility: Secondary Homemaking Comments:  (ppt and husband share duties) Current License: No Mode of Transportation: Family Occupation: Retired Leisure and Hobbies: tv, news, read, decorate  IADL Comments: flowers Prior Function Level of Independence: Independent with basic ADLs, Independent with homemaking with ambulation  Able to Take Stairs?: Yes (needed min assist with curbs) Driving: No 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, 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 ADL   Vision/Perception  Vision- History Baseline Vision/History: Wears glasses Wears Glasses: Reading only Patient Visual Report: Blurring of vision;No change from baseline Vision- Assessment Vision Assessment?: No apparent visual deficits Eye Alignment: Within Functional Limits Ocular Range of Motion: Within Functional Limits Tracking/Visual Pursuits: Decreased smoothness of eye movement to LEFT superior field;Decreased smoothness of eye movement to LEFT inferior field;Decreased smoothness of eye movement to RIGHT inferior field;Decreased smoothness of eye movement to RIGHT superior field Saccades: Additional head turns occurred during testing Convergence: Within functional limits Diplopia Assessment:  (reports has improved) Perception Perception: Within Functional Limits Praxis Praxis: Intact  Cognition Overall Cognitive Status: Within Functional Limits for tasks assessed Arousal/Alertness: Awake/alert Orientation Level: Person;Place;Situation Person: Oriented Place: Oriented Situation: Oriented Year: 2017 Month: May Day of Week: Correct Memory: Appears intact Immediate Memory Recall: Sock;Blue;Bed Memory Recall: Blue;Sock;Bed Memory Recall Sock: With Cue Memory Recall Blue: Without Cue Memory Recall Bed:  (unable with cue) Attention: Selective Selective Attention: Appears  intact Awareness: Appears intact Problem Solving: Appears intact Executive Function: Reasoning;Sequencing;Organizing;Decision Making;Initiating;Self Monitoring;Self Correcting Reasoning: Appears intact Sequencing: Appears intact Organizing: Appears intact Decision Making: Appears intact Initiating: Appears intact Self Monitoring: Appears intact Self Correcting: Appears intact Safety/Judgment: Appears intact Sensation Sensation Light Touch: Appears Intact Proprioception: Appears Intact Coordination Gross Motor Movements are Fluid and Coordinated: Yes Fine Motor Movements are Fluid and Coordinated: Yes Motor  Motor Motor: Hemiplegia Motor - Skilled Clinical Observations: RUE weakness Mobility  Transfers Sit to Stand: 4: Min assist Sit to Stand Details: Verbal cues for technique  Trunk/Postural Assessment  Cervical Assessment Cervical Assessment: Exceptions to [facility] Cervical AROM Overall Cervical AROM: Due to premorid status Thoracic Assessment Thoracic Assessment: Exceptions to [facility] Thoracic AROM Overall Thoracic AROM: Due to premorid status Lumbar Assessment Lumbar Assessment: Exceptions to [facility] Lumbar AROM Overall Lumbar AROM: Due to premorid status Postural Control Postural Control: Within Functional Limits  Balance Balance Balance Assessed: Yes Dynamic Sitting Balance Dynamic Sitting - Balance Activities: Reaching for objects Sitting balance - Comments:  (min guard assist) Static Standing Balance Static Standing - Balance Support: Left upper extremity supported Static Standing - Level of Assistance: 5: Stand by assistance Dynamic Standing Balance Dynamic Standing - Balance Support: Left upper extremity supported (has pain in right ankle) Dynamic Standing - Level of Assistance: 4: Min assist Dynamic Standing - Balance Activities: Reaching across midline;Reaching for objects;Forward lean/weight shifting;Lateral lean/weight shifting Extremity/Trunk Assessment RUE  Assessment RUE Assessment: Exceptions to [facility] RUE AROM (degrees) Overall AROM Right Upper Extremity: Deficits LUE Assessment LUE Assessment: Within Functional Limits   See Function Navigator for Current Functional Status.   Refer to Care Plan for Long Term Goals  Recommendations for other services: None  Discharge Criteria: Patient will be discharged from OT if patient refuses treatment 3 consecutive times without medical reason, if treatment goals not met, if there is a change in medical status, if patient makes no progress towards goals or if patient is discharged from hospital.  The above assessment, treatment plan, treatment alternatives and goals were discussed and mutually agreed upon: by patient and by family  Lisa Roca 01/30/2016, 5:56 PM

## 2016-01-30 NOTE — Evaluation (Signed)
Speech Language Pathology Assessment and Plan  Patient Details  Name: Adrienne [name] MRN: [id number] Date of Birth: 11/12/1927  SLP Diagnosis: Speech and Language deficits  Rehab Potential: Good ELOS: 7-10 days     Today's Date: 01/30/2016 SLP Individual Time: 0539-7673 SLP Individual Time Calculation (min): 55 min   Problem List:  Patient Active Problem List   Diagnosis Date Noted  . Diplopia 01/29/2016  . GERD (gastroesophageal reflux disease) 01/29/2016  . Left middle cerebral artery stroke ([place]) 01/29/2016  . Persistent atrial fibrillation ([place])   . Gastrointestinal hemorrhage associated with intestinal diverticulosis   . Gait disturbance, post-stroke 01/27/2016  . Fall   . Secondary hypertension, unspecified   . Cerebral infarction due to thrombosis of left middle cerebral artery (HCC) s/p mechanical thrombectomy   . Malnutrition of moderate degree 01/09/2016  . GI bleed 01/06/2016  . Chronic atrial fibrillation ([place]) 06/10/2015  . Pulmonary hypertension, secondary ([place]) 06/10/2015  . Chronic anticoagulation 06/10/2015  . Hyperlipidemia 06/10/2015  . Essential hypertension 06/10/2015  . History of stroke 06/10/2015  . Coronary artery disease due to lipid rich plaque 06/10/2015  . Afib ([place])   . Elevated uric acid in blood   . Arthritis of ankle   . Pulmonary HTN ([place])    Past Medical History:  Past Medical History  Diagnosis Date  . Afib ([place])   . Elevated uric acid in blood   . Arthritis of ankle   . Pulmonary HTN ([place])   . Long-term (current) use of anticoagulants   . Moderate mitral regurgitation   . CAD (coronary artery disease)   . Edema   . Cancer [facility]) 2012    breast cancer-right  . Hypertension   . Urinary incontinence   . TIA (transient ischemic attack) 09/2011  . Diverticulosis   . Compression fracture of T12 vertebra [facility])    Past Surgical History:  Past Surgical History  Procedure Laterality Date  . Cardiac catheterization  2013  .  Replacement total knee bilateral    . Gallbladder surgery    . Appendectomy    . Mastectomy Right   . Cataract extraction, bilateral    . Breast surgery  2012    Rt.mastectomy--[place]  . Tonsillectomy and adenoidectomy      as a child  . Dilation and curettage of uterus      in her early 31's for DUB  . Esophagogastroduodenoscopy Left 01/08/2016    Procedure: ESOPHAGOGASTRODUODENOSCOPY (EGD);  Surgeon: Arta Silence, MD;  Location: [facility];  Service: Endoscopy;  Laterality: Left;  . Radiology with anesthesia N/A 01/25/2016    Procedure: RADIOLOGY WITH ANESTHESIA;  Surgeon: Luanne Bras, MD;  Location: [place];  Service: Radiology;  Laterality: N/A;    Assessment / Plan / Recommendation Clinical Impression   Adrienne [name] is a 82 y.o.right handed  female recently admitted 80/27/2017 with intermittent bloody stools and upper abdominal pain. CT showed distal diverticulosis otherwise unrevealing.  Underwent upper endoscopy per gastroenterology that showed small hiatal hernia. A medium nonbleeding diverticulum found in the area of the papilla. Patient's anticoagulation remained on hold and was discharged to home 01/10/2016 and plan to follow up outpatient with GI. Presented 01/25/2016 with right-sided weakness after being found down in the bathroom by her husband.  CT showed atrophy small vessel disease in the periventricular white matter. Evidence of prior infarct in the lateral left lentiform nucleus.  MRI showed left MCA territory infarct. Patient was admitted for a comprehensive rehabilitation program on 01/29/2016.  SLP  evaluation was completed on 01/30/2016 with the following results:  Pt presents with mild higher level word finding impairment in conversations.  Confrontational naming, responsive naming, complex comprehension, reading comprehension, and written expression all WFL.  Informally, pt presents with grossly intact cognitive function for tasks assessed.  Pt reports at times  being frustrated with being unable to find the word she wants to say and as a result would benefit from skilled ST while inpatient in order to maximize functional independence and reduce burden of care prior to discharge.    Skilled Therapeutic Interventions          Cognitive-linguistic evaluation completed with results and recommendations reviewed with patient and family. Therapist discussed compensatory strategies to facilitate word retrieval in conversations including description, substitution, and phonemic cues.  SLP also encouraged pt's family to allow pt extra time to communicate and support her with semantic cues when necessary.  Pt and family verbalized understanding of skilled education.   All questions answered to their satisfaction at this time.      SLP Assessment  Patient will need skilled [place] [facility] during CIR admission    Recommendations  Patient destination: Home Follow up Recommendations: None Equipment Recommended: None recommended by SLP    SLP Frequency 3 to 5 out of 7 days   SLP Duration  SLP Intensity  SLP Treatment/Interventions 7-10 days   Minumum of 1-2 x/day, 30 to 90 minutes  Internal/external aids;Patient/family education;Speech/Language facilitation    Pain Pain Assessment Pain Assessment: No/denies pain  Prior Functioning Cognitive/Linguistic Baseline: Within functional limits Type of Home: House  Lives With: Spouse Available Help at Discharge: Family Vocation: Retired  Function:  Eating Eating               Cognition Comprehension Comprehension assist level: Follows complex conversation/direction with no assist  Expression   Expression assist level: Expresses basic 90% of the time/requires cueing < 10% of the time.  Social Interaction Social Interaction assist level: Interacts appropriately with others - No medications needed.  Problem Solving Problem solving assist level: Solves complex problems: With extra  time  Memory Memory assist level: More than reasonable amount of time   Short Term Goals: Week 1: SLP Short Term Goal 1 (Week 1): STG=LTG due to ELOS   Refer to Care Plan for Long Term Goals  Recommendations for other services: None  Discharge Criteria: Patient will be discharged from SLP if patient refuses treatment 3 consecutive times without medical reason, if treatment goals not met, if there is a change in medical status, if patient makes no progress towards goals or if patient is discharged from hospital.  The above assessment, treatment plan, treatment alternatives and goals were discussed and mutually agreed upon: by patient  Emilio Math 01/30/2016, 4:29 PM

## 2016-01-30 NOTE — Progress Notes (Signed)
Patient ID: Adrienne [name], female   DOB: [birth year], 82 y.o.   MRN: [id number]  01/30/16.  82 y/o admit for CIR with Left MCA infarct with gait disorder. H/o CAF and coumadin had been on hold following recent admit for GIB.  Past Medical History  Diagnosis Date  . Afib ([employer])   . Elevated uric acid in blood   . Arthritis of ankle   . Pulmonary HTN ([place])   . Long-term (current) use of anticoagulants   . Moderate mitral regurgitation   . CAD (coronary artery disease)   . Edema   . Cancer [facility]) 2012    breast cancer-right  . Hypertension   . Urinary incontinence   . TIA (transient ischemic attack) 09/2011  . Diverticulosis   . Compression fracture of T12 vertebra (HCC)    Alert; only complaint is R ankle pain  Physical Exam  Constitutional: She is oriented to person, place, and time. She appears well-developed.  HENT:  Head: Normocephalic.  Eyes: EOM are normal.  Neck: Normal range of motion. Neck supple. No thyromegaly present.  Cardiovascular:  Cardiac rate controlled Irregular  Respiratory: Effort normal and breath sounds normal.  GI: Soft. Bowel sounds are normal. She exhibits no distension.  Neurological: She is alert and oriented to person, place, and time.  Extr- trace ankle edema with some tenderness to palpation of R ankle  Skin:  Multi bruises to extremeties  Motor strength is 4/5 bilateral   Patient Vitals for the past 24 hrs:  BP Temp Temp src Pulse Resp SpO2  01/30/16 0555 (!) 132/48 mmHg 98.8 F (37.1 C) Oral 82 18 97 %  01/29/16 2106 (!) 130/55 mmHg - - 76 - -  01/29/16 1816 (!) 139/54 mmHg 98.4 F (36.9 C) Oral 81 18 99 %      Impression and Plan:  Left MCA infarct with gait disorder.  Continue CIR CAF- controlled VR Chronic Anticoagulation- now on Eliquis HLD- continue statin HTN-stable CAD-stable H/O Recent GIB- continue to monitor Post stroke gait abnormality

## 2016-01-30 NOTE — Plan of Care (Signed)
Problem: RH BLADDER ELIMINATION Goal: RH STG MANAGE BLADDER WITH ASSISTANCE STG Manage Bladder With MOD I  Outcome: Not Progressing c/o urgency

## 2016-01-30 NOTE — Evaluation (Signed)
Physical Therapy Assessment and Plan  Patient Details  Name: Adrienne [name] MRN: [id number] Date of Birth: 04/12/1928  PT Diagnosis: Abnormality of gait, Coordination disorder, Hemiparesis RUE>LE and Muscle weakness Rehab Potential: Good ELOS: 7-10   Today's Date: 01/30/2016 PT Individual Time: 1330-1453 PT Individual Time Calculation (min): 83 min    Problem List:  Patient Active Problem List   Diagnosis Date Noted  . Diplopia 01/29/2016  . GERD (gastroesophageal reflux disease) 01/29/2016  . Left middle cerebral artery stroke (HCC) 01/29/2016  . Persistent atrial fibrillation (HCC)   . Gastrointestinal hemorrhage associated with intestinal diverticulosis   . Gait disturbance, post-stroke 01/27/2016  . Fall   . Secondary hypertension, unspecified   . Cerebral infarction due to thrombosis of left middle cerebral artery (HCC) s/p mechanical thrombectomy   . Malnutrition of moderate degree 01/09/2016  . GI bleed 01/06/2016  . Chronic atrial fibrillation (HCC) 06/10/2015  . Pulmonary hypertension, secondary (HCC) 06/10/2015  . Chronic anticoagulation 06/10/2015  . Hyperlipidemia 06/10/2015  . Essential hypertension 06/10/2015  . History of stroke 06/10/2015  . Coronary artery disease due to lipid rich plaque 06/10/2015  . Afib (HCC)   . Elevated uric acid in blood   . Arthritis of ankle   . Pulmonary HTN (HCC)     Past Medical History:  Past Medical History  Diagnosis Date  . Afib (HCC)   . Elevated uric acid in blood   . Arthritis of ankle   . Pulmonary HTN (HCC)   . Long-term (current) use of anticoagulants   . Moderate mitral regurgitation   . CAD (coronary artery disease)   . Edema   . Cancer (HCC) 2012    breast cancer-right  . Hypertension   . Urinary incontinence   . TIA (transient ischemic attack) 09/2011  . Diverticulosis   . Compression fracture of T12 vertebra (HCC)    Past Surgical History:  Past Surgical History  Procedure Laterality Date  .  Cardiac catheterization  2013  . Replacement total knee bilateral    . Gallbladder surgery    . Appendectomy    . Mastectomy Right   . Cataract extraction, bilateral    . Breast surgery  2012    Rt.mastectomy--[place]  . Tonsillectomy and adenoidectomy      as a child  . Dilation and curettage of uterus      in her early 40's for DUB  . Esophagogastroduodenoscopy Left 01/08/2016    Procedure: ESOPHAGOGASTRODUODENOSCOPY (EGD);  Surgeon: William Outlaw, MD;  Location: [facility];  Service: Endoscopy;  Laterality: Left;  . Radiology with anesthesia N/A 01/25/2016    Procedure: RADIOLOGY WITH ANESTHESIA;  Surgeon: Sanjeev Deveshwar, MD;  Location: [facility] OR;  Service: Radiology;  Laterality: N/A;    Assessment & Plan Clinical Impression: Adrienne [name] is a 82 y.o.right handed  female with history of Right breast cancer, CAD, atrial fibrillation with chronic Coumadin. Patient lives with spouse. Recently admitted 01/07/2016 with intermittent bloody stools and upper abdominal pain. CT showed distal diverticulosis otherwise unrevealing. MRI of the spine showed T12 compression fracture as well as some disc protrusions. INR admission of 2.5 and received vitamin K. Hemoglobin 13-9. Underwent upper endoscopy per gastroenterology that showed small hiatal hernia. A medium nonbleeding diverticulum found in the area of the papilla. Patient's anticoagulation remained on hold and was discharged to home 01/10/2016 and plan to follow up outpatient with GI. Presented 01/25/2016 with right-sided weakness after being found down in the bathroom by her husband. Her Coumadin had   recently been restarted one week prior. INR on admission 1.5. CT showed atrophy small vessel disease in the periventricular white matter. Evidence of prior infarct in the lateral left lentiform nucleus. CT angiogram head and neck positive for emergent large vessel occlusion at the left MCA bifurcation. Superimposed atherosclerotic stenosis left  ICA origin left supraclinoid ICA. . Underwent revascularization of occluded left MCA per interventional radiology. Maintained for a short time on ventilator. MRI showed left MCA territory infarct. Echocardiogram with ejection fraction of 65% no wall motion abnormalities. Neurology consulted presently maintained on aspirin therapy and transition to Eliquis 01/28/2016 with no plan to resume Coumadin. Hemoglobin and hematocrit remained stable. Patient transferred to CIR on 01/29/2016 .   Patient currently requires min with mobility secondary to muscle weakness, unbalanced muscle activation and decreased coordination and decreased standing balance, decreased postural control and decreased balance strategies.  Prior to hospitalization, patient was independent  with mobility and lived with Spouse in a House home.  Home access is 1 step (~3" through garage) Stairs to enter.  Patient will benefit from skilled PT intervention to maximize safe functional mobility, minimize fall risk and decrease caregiver burden for planned discharge home with 24 hour supervision.  Anticipate patient will benefit from follow up HH at discharge.  PT - End of Session Activity Tolerance: Tolerates 30+ min activity with multiple rests Endurance Deficit: Yes PT Assessment Rehab Potential (ACUTE/IP ONLY): Good Barriers to Discharge: Inaccessible home environment PT Patient demonstrates impairments in the following area(s): Balance;Edema;Endurance;Motor PT Transfers Functional Problem(s): Bed Mobility;Bed to Chair;Car;Furniture PT Locomotion Functional Problem(s): Stairs;Ambulation PT Plan PT Intensity: Minimum of 1-2 x/day ,45 to 90 minutes PT Frequency: 5 out of 7 days PT Duration Estimated Length of Stay: 7-10 PT Treatment/Interventions: Ambulation/gait training;Community reintegration;DME/adaptive equipment instruction;Neuromuscular re-education;Psychosocial support;Stair training;UE/LE Strength taining/ROM;UE/LE Coordination  activities;Therapeutic Activities;Pain management;Discharge planning;Balance/vestibular training;Functional mobility training;Patient/family education;Therapeutic Exercise PT Transfers Anticipated Outcome(s): mod I, supervision for car PT Locomotion Anticipated Outcome(s): mod I in home, supervision in community and on stairs PT Recommendation Follow Up Recommendations: Home health PT Patient destination: Home Equipment Recommended: Cane Equipment Details: if pt does not already have SPC  Skilled Therapeutic Intervention Pt received resting in w/c with family present, no c/o pain and agreeable to therapy session.  PT provided education to pt and family regarding role of PT, goals, plan of care, ELOS, and safety plan.  Skilled therapeutic intervention began following initial assessment.  Pt performs transfers throughout session with HHA progress to SPC with steady assist for balance.  Gait training x120' +150' with HHA and min assist.  Gait training x100' +10' on compliant surfaces with SPC and min assist with mod progress to supervision verbal cues for sequencing with AD.  PT instructed pt in negotiation of 8 3" steps with SPC and R rail with steady assist and verbal cues for sequencing.  Pt returned to room at end of session and requesting to toilet.  Steady assist to toilet and handoff to NT in bathroom due to time constraints.    PT Evaluation Precautions/Restrictions Precautions Precautions: Fall Precaution Comments: pt is unsteady on her feet, family reports this was the case PTA, but more now Restrictions Weight Bearing Restrictions: No Pain Pain Assessment Pain Assessment: 0-10 Pain Score: 4  Pain Location: Ankle Pain Orientation: Right Home Living/Prior Functioning Home Living Available Help at Discharge: Family;Available 24 hours/day Type of Home: House Home Access: Stairs to enter Entrance Stairs-Number of Steps: 1 step (~3" through garage)  Entrance Stairs-Rails: None Home  Layout:   One level Bathroom Shower/Tub: Walk-in shower;Door  Lives With: Spouse Prior Function Level of Independence: Needs assistance with gait  Able to Take Stairs?: Yes (needs assistance) Driving: No 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, 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 Vision/Perception  Vision - Risk analyst: Within Passenger transport manager Range of Motion: Within Functional Limits Tracking/Visual Pursuits: Decreased smoothness of eye movement to LEFT superior field;Decreased smoothness of eye movement to LEFT inferior field;Decreased smoothness of eye movement to RIGHT inferior field;Decreased smoothness of eye movement to RIGHT superior field Saccades: Additional head turns occurred during testing Convergence: Within functional limits Diplopia Assessment:  (reports has improved)  Cognition Overall Cognitive Status: Within Functional Limits for tasks assessed Arousal/Alertness: Awake/alert Orientation Level: Oriented X4 Sensation Sensation Light Touch: Appears Intact Coordination Gross Motor Movements are Fluid and Coordinated: Yes Fine Motor Movements are Fluid and Coordinated: Yes  Mobility Transfers Transfers: Yes Sit to Stand: 4: Min guard Stand Pivot Transfers: 4: Min guard Locomotion  Ambulation Ambulation: Yes Ambulation/Gait Assistance: 4: Min assist Ambulation Distance (Feet): 150 Feet Assistive device: Straight cane Ambulation/Gait Assistance Details: Verbal cues for sequencing;Verbal cues for gait pattern Gait Gait: Yes Gait Pattern: Impaired Gait Pattern: Step-to pattern;Trunk flexed Gait velocity: decreased Stairs / Additional Locomotion Stairs: Yes Stairs Assistance: 4: Min guard Stair Management Technique: One rail Right;With cane Number of Stairs: 8 Height of Stairs: 3 Wheelchair Mobility Wheelchair Mobility: No  Trunk/Postural Assessment  Cervical Assessment Cervical Assessment:  (slight forward  head) Thoracic Assessment Thoracic Assessment:  (rounded shoulders, mild increase in kyphotic posture) Postural Control Postural Control: Within Functional Limits  Balance Balance Balance Assessed: Yes Static Standing Balance Static Standing - Balance Support: Left upper extremity supported Static Standing - Level of Assistance: 5: Stand by assistance Dynamic Standing Balance Dynamic Standing - Balance Support: Left upper extremity supported;During functional activity Dynamic Standing - Level of Assistance: 4: Min assist Extremity Assessment      RLE AROM (degrees) RLE Overall AROM Comments: WFL assessed in sitting, R foot painful across top RLE Strength Right Hip Flexion: 4-/5 Right Knee Flexion: 4-/5 Right Knee Extension: 4/5 Right Ankle Dorsiflexion: 3/5 Right Ankle Plantar Flexion: 3/5 LLE AROM (degrees) LLE Overall AROM Comments: WFL assessed in sitting LLE Strength Left Hip Flexion: 4-/5 Left Knee Flexion: 3+/5 Left Knee Extension: 4/5 Left Ankle Dorsiflexion: 3/5 Left Ankle Plantar Flexion: 3/5   See Function Navigator for Current Functional Status.   Refer to Care Plan for Long Term Goals  Recommendations for other services: None  Discharge Criteria: Patient will be discharged from PT if patient refuses treatment 3 consecutive times without medical reason, if treatment goals not met, if there is a change in medical status, if patient makes no progress towards goals or if patient is discharged from hospital.  The above assessment, treatment plan, treatment alternatives and goals were discussed and mutually agreed upon: by patient and by family  Earnest Conroy Penven-Crew 01/30/2016, 3:59 PM

## 2016-01-31 NOTE — Progress Notes (Signed)
Patient ID: Adrienne [name], female   DOB: [birth year], 82 y.o.   MRN: [id number]   Patient ID: Adrienne [name], female   DOB: 19-May-1928, 82 y.o.   MRN: [id number]  01/31/16.  82 y/o admit for CIR with Left MCA infarct with gait disorder. H/o CAF and coumadin had been on hold following recent admit for GIB.  Past Medical History  Diagnosis Date  . Afib ([place])   . Elevated uric acid in blood   . Arthritis of ankle   . Pulmonary HTN ([place])   . Long-term (current) use of anticoagulants   . Moderate mitral regurgitation   . CAD (coronary artery disease)   . Edema   . Cancer [facility]) 2012    breast cancer-right  . Hypertension   . Urinary incontinence   . TIA (transient ischemic attack) 09/2011  . Diverticulosis   . Compression fracture of T12 vertebra (HCC)    Alert; only complaint is persistent  R ankle pain  Physical Exam  Constitutional: She is oriented to person, place, and time. She appears well-developed.  HENT:  Head: Normocephalic.  Eyes: EOM are normal.  Neck: Normal range of motion. Neck supple. No thyromegaly present.  Cardiovascular:  Cardiac rate controlled Irregular  Respiratory: Effort normal and breath sounds normal.  GI: Soft. Bowel sounds are normal. She exhibits no distension.  Neurological: She is alert and oriented to person, place, and time.  Extr- trace ankle edema with some tenderness to palpation of R ankle  Skin:  Multi bruises to extremeties  Motor strength is 4/5 bilateral   Patient Vitals for the past 24 hrs:  BP Temp Temp src Pulse Resp SpO2  01/30/16 2134 (!) 152/65 mmHg - - 84 - -  01/30/16 2023 (!) 161/68 mmHg - - 75 - -  01/30/16 1500 (!) 160/68 mmHg 97.5 F (36.4 C) Oral 80 18 100 %      Impression and Plan:  Left MCA infarct with gait disorder.  Continue CIR CAF- controlled VR Chronic Anticoagulation- now on Eliquis HLD- continue statin HTN-stable CAD-stable H/O Recent GIB- continue to monitor Post stroke gait abnormality

## 2016-01-31 NOTE — Discharge Summary (Signed)
Stroke Discharge Summary  Patient ID: Adrienne [name]   MRN: [id number]      DOB: 23-Dec-1927  Date of Admission: 01/25/2016 Date of Discharge: 01/29/2016  Attending Physician:  Rosalin Hawking, Stroke MD Consulting Physician(s):    Rush Landmark, MD (CCM), Willaim Rayas Nicole Kindred) Estanislado Pandy, MD (Interventional Neuroradiologist), Alysia Penna, MD (Physical Medicine & Rehabtilitation)  Patient's PCP:  Tawanna Solo, MD  Discharge Diagnoses:  Principal Problem:   Cerebral infarction due to thrombosis of left middle cerebral artery [facility]) s/p mechanical thrombectomy Active Problems:   Chronic anticoagulation   Hyperlipidemia   History of stroke   Coronary artery disease due to lipid rich plaque   Fall   Secondary hypertension, unspecified   Gait disturbance, post-stroke   Persistent atrial fibrillation ([place])   Gastrointestinal hemorrhage associated with intestinal diverticulosis   Diplopia   GERD (gastroesophageal reflux disease)   Past Medical History  Diagnosis Date  . Afib ([place])   . Elevated uric acid in blood   . Arthritis of ankle   . Pulmonary HTN ([place])   . Long-term (current) use of anticoagulants   . Moderate mitral regurgitation   . CAD (coronary artery disease)   . Edema   . Cancer [facility]) 2012    breast cancer-right  . Hypertension   . Urinary incontinence   . TIA (transient ischemic attack) 09/2011  . Diverticulosis   . Compression fracture of T12 vertebra [facility])    Past Surgical History  Procedure Laterality Date  . Cardiac catheterization  2013  . Replacement total knee bilateral    . Gallbladder surgery    . Appendectomy    . Mastectomy Right   . Cataract extraction, bilateral    . Breast surgery  2012    Rt.mastectomy--[place], [place]  . Tonsillectomy and adenoidectomy      as a child  . Dilation and curettage of uterus      in her early 70's for DUB  . Esophagogastroduodenoscopy Left 01/08/2016    Procedure: ESOPHAGOGASTRODUODENOSCOPY (EGD);   Surgeon: Arta Silence, MD;  Location: [facility];  Service: Endoscopy;  Laterality: Left;  . Radiology with anesthesia N/A 01/25/2016    Procedure: RADIOLOGY WITH ANESTHESIA;  Surgeon: Luanne Bras, MD;  Location: [place];  Service: Radiology;  Laterality: N/A;    Medications to be continued on Rehab . anastrozole 1 mg Oral Daily  . apixaban 5 mg Oral BID  . atorvastatin 20 mg Oral q1800  . diltiazem 60 mg Oral Q8H  . feeding supplement (ENSURE ENLIVE) 237 mL Oral BID BM  . metoprolol tartrate 100 mg Oral BID  . pantoprazole 40 mg Oral Daily        LABORATORY STUDIES CBC    Component Value Date/Time   WBC 11.5* 01/29/2016 0527   RBC 3.44* 01/29/2016 0527   HGB 9.8* 01/29/2016 0527   HCT 31.5* 01/29/2016 0527   PLT 346 01/29/2016 0527   MCV 91.6 01/29/2016 0527   MCH 28.5 01/29/2016 0527   MCHC 31.1 01/29/2016 0527   RDW 14.8 01/29/2016 0527   LYMPHSABS 0.9 01/26/2016 0300   MONOABS 0.1 01/26/2016 0300   EOSABS 0.0 01/26/2016 0300   BASOSABS 0.0 01/26/2016 0300   CMP    Component Value Date/Time   NA 140 01/29/2016 0527   K 4.5 01/29/2016 0527   CL 105 01/29/2016 0527   CO2 25 01/29/2016 0527   GLUCOSE 96 01/29/2016 0527   BUN 14 01/29/2016 0527   CREATININE 0.66  01/29/2016 0527   CALCIUM 8.7* 01/29/2016 0527   PROT 6.4* 01/25/2016 0807   ALBUMIN 3.3* 01/25/2016 0807   AST 18 01/25/2016 0807   ALT 12* 01/25/2016 0807   ALKPHOS 81 01/25/2016 0807   BILITOT 0.4 01/25/2016 0807   GFRNONAA >60 01/29/2016 0527   GFRAA >60 01/29/2016 0527   COAGS Lab Results  Component Value Date   INR 1.67* 01/26/2016   INR 1.46 01/25/2016   INR 1.25 01/08/2016   Lipid Panel    Component Value Date/Time   CHOL 143 01/26/2016 0300   TRIG 101 01/26/2016 0300   HDL 43 01/26/2016 0300   CHOLHDL 3.3 01/26/2016 0300   VLDL 20 01/26/2016 0300   LDLCALC 80 01/26/2016 0300   HgbA1C  Lab Results  Component Value Date   HGBA1C  5.6 01/26/2016   Urinalysis    Component Value Date/Time   COLORURINE YELLOW 01/25/2016 1856   APPEARANCEUR CLEAR 01/25/2016 1856   LABSPEC 1.035* 01/25/2016 1856   PHURINE 7.0 01/25/2016 1856   GLUCOSEU NEGATIVE 01/25/2016 1856   HGBUR NEGATIVE 01/25/2016 1856   BILIRUBINUR NEGATIVE 01/25/2016 1856   KETONESUR NEGATIVE 01/25/2016 1856   PROTEINUR NEGATIVE 01/25/2016 1856   NITRITE NEGATIVE 01/25/2016 1856   LEUKOCYTESUR NEGATIVE 01/25/2016 1856   Urine Drug Screen     Component Value Date/Time   LABOPIA NONE DETECTED 01/25/2016 1855   COCAINSCRNUR NONE DETECTED 01/25/2016 1855   LABBENZ NONE DETECTED 01/25/2016 1855   AMPHETMU NONE DETECTED 01/25/2016 1855   THCU NONE DETECTED 01/25/2016 1855   LABBARB NONE DETECTED 01/25/2016 1855    Alcohol Level    Component Value Date/Time   ETH <5 01/25/2016 0807     SIGNIFICANT DIAGNOSTIC STUDIES  Ct Head Wo Contrast 01/25/2016 Early changes of acute posterior left MCA infarct. No parenchymal hematoma. 01/25/2016 Atrophy with small vessel disease in the periventricular white matter. Prior infarcts as noted above. No acute appearing infarct is evident. No hemorrhage or mass effect. ADDENDUM Left hemisphere ASPECTS score = 9; small area of cortical hypodensity in the left M2 as seen on series 21, image 14.had given verbal preliminary ASPECTS score of "10"   Ct Angio Head & Neck W/cm &/or Wo/cm 01/25/2016 1. Positive for Emergent Large Vessel Occlusion at The left MCA bifurcation. Abnormal left MCA perfusion with noncontrast head CT and CT Brain Perfusion parameters favorable for clot retrieval. 2. Superimposed atherosclerotic stenosis at the left ICA origin (60-65%) and left supraclinoid ICA siphon (moderate) related to calcified plaque. 3. Superimposed mild to moderate second and third order branch intracranial atherosclerosis including in the bilateral ACA, right MCA, and left PCA branches. 4. Right carotid bifurcation calcified  plaque without stenosis.   Ct C-spine No Charge 01/25/2016 Multilevel degenerative change without acute abnormality.   Ct Cerebral Perfusion W/cm 01/25/2016 1. Positive for Emergent Large Vessel Occlusion at The left MCA bifurcation. Abnormal left MCA perfusion with noncontrast head CT and CT Brain Perfusion parameters favorable for clot retrieval.   Cerebral angiogram 01/25/2016 bilateral common carotid arteriograms followed by complete revascularization of Occluded Lt MCA With x 1 pass with Solitaire 4 mmx 40 mm Retrieval device and 4 mg of superselective IA integrelin with TICI 3 revascularization.  MRI Brain Wo Contrast 01/27/2016 Only a small volume of left MCA territory infarction occurred, primarily along the left sylvian fissure. No hemorrhage or mass effect. 2. Occasional other punctate areas of restricted diffusion over both superior convexities, probably neuro-intervention related.  CXR 01/26/2016 No active disease. Probable chronic  mild interstitial prominence. Endotracheal and NG tube in place. No pneumothorax.  2D echocardiogram - Left ventricle: The cavity size was normal. Systolic function was  normal. The estimated ejection fraction was in the range of 60% to 65%. Wall motion was normal; there were no regional wall motion abnormalities. The study is not technically sufficient to allow evaluation of LV diastolic function. - Aortic valve: Valve mobility was mildly restricted. Transvalvular velocity was within the normal range. There was no stenosis. There was trivial regurgitation. - Mitral valve: Calcified annulus. Transvalvular velocity was within the normal range. There was no evidence for stenosis. There was trivial regurgitation. - Left atrium: The atrium was moderately dilated. - Right ventricle: The cavity size was normal. Wall thickness was normal. Systolic function was normal. - Atrial septum: No defect or patent foramen ovale was identified by color flow Doppler. -  Tricuspid valve: There was mild regurgitation. - Pulmonary arteries: Systolic pressure was within the normal range. PA peak pressure: 29 mm Hg (S).     HISTORY OF PRESENT ILLNESS Raylee Fulwood is an 82 y.o. female patient who was brought into the emergency room with right sided weakness, aphasia, post fall. History provided by her husband. Patient is aphasic and unable to provide any history. The patient woke up around 6:15 AM and patient was in her normal state of health, no weakness no speech problems at that time. Last seen normal by husband at 7 AM 01/25/2016.  The patient went to take shower and she appeared to fall in the bathroom and got some glass on her feet during the fall. When he checked on her after the fall, she was noted to have right-sided weakness, unable to respond to commands. Hence she was brought into the ER for further evaluation and stroke code was called by the EMS.  Patient has a history of atrial fibrillation, on Coumadin. She was admitted for GI bleed evaluation with a drop in hematocrit 3 weeks ago and after completion of extensive GI workup, Coumadin was restarted 1 week ago. Her INR on arrival was 1.5. NIHSS 23. mRS 0.  Patient was not administered IV t-PA secondary to recent GI bleed less than 3 weeks ago, on coumadin. She was admitted to the neuro ICU for further evaluation and treatment.    HOSPITAL COURSE Ms. Oriel Gal is a 82 y.o. female with history of atrial fibrillation on coumadin INR 1.5 with GIB 3 weeks ago presenting with R sided weakness and receptive phasia following a fall. She did not receive IV t-PA per documenation due to hx GIB and INR 1.5. CTA showed a L M2 occlusion. She was taken to IR with complete TICI 3 revascularization of L MCA with Solitaire and IA integrelin.   Stroke: Dominant left MCA infarct s/p TICI3 revascularization of L MCA M2 occlusion with mechanical thrombectomy and IA integrelin. Infarct embolic secondary to known atrial  fibrillation in setting of subtherapeutic coumadin.  Resultant Right side weakness- Significantly improved  CTA Head & neck L MCA bifurcation occlusion, L ICA origin 60-65% stenosis, L ICA siphon moderate plaque. Intracranial atherosclerosis B ACA, R MCA, L PCA branches  CT perfusion L MCA perfusion deficit favorable for clot retrieval  Cerebral angiogram L MCA occlusion s/p TICI3 revascularization with Solitaire and IA integrelin, no left ICA origin stenosis  MRI - Only a small volume of left MCA territory infarction occurred.  2D Echo EF 60-65%   LDL 80  HgbA1c 5.6  warfarin daily prior to admission INR 1.5  on arrival, changed to eliquis  Ongoing aggressive stroke risk factor management  Therapy recommendations: CIR   Disposition: CIR  Atrial Fibrillation  Home anticoagulation: coumadin  INR 1.5 on admission  Changed to Eliquis (apixaban) daily, started 5/17   Rate controlled on PO diltiazem 60mg  Q8H, switched to CR am, cont 100mg  BID of metop  Recent GI bleeding  Required PRBC transfusion  baseline 10-11  As low as 8.9 during hospitalization, now uo to 11.5  Coumadin was discontinued due to hx LGIB  changed to eliquis due to lower bleeding risk profile  Monitor H&H  Hypertension  Stable  Long term BP goal is normotensive  Hyperlipidemia  Home meds: No statin  LDL 80, goal < 70  Started lipitor at 20mg  daily  Continue statin at discharge  Other Stroke Risk Factors  Advanced age  Former Cigarette smoker  Hx stroke/TIA  Coronary artery disease  Other Active Problems  R breast cancer 2012 on anastrozole  GERD on PPI   DISCHARGE EXAM Blood pressure 151/76, pulse 82, temperature 97.9 F (36.6 C), temperature source Oral, resp. rate 18, height 5\' 7"  (1.702 m), weight 66.3 kg (146 lb 2.6 oz), last menstrual period 09/12/1973, SpO2 97 %.   General - Well nourished, well developed, not in distress.  Ophthalmologic  - Fundi not examined.  Cardiovascular - irregularly irregular heart rate and rhythm.  Mental Status -  Level of arousal and orientation to time, place, and person were intact. Language including expression, naming, repetition, comprehension was assessed and found intact. Attention span and concentration were normal. Fund of Knowledge was assessed and was intact.  Cranial Nerves II - XII - II - Visual field intact OU. III, IV, VI - Extraocular movements intact. V - Facial sensation intact bilaterally. VII - Facial movement intact bilaterally. VIII - Hearing & vestibular intact bilaterally. X - Palate elevates symmetrically. XI - Chin turning & shoulder shrug intact bilaterally. XII - Tongue protrusion intact.  Motor Strength - The patient's strength was normal in all extremities and pronator drift was absent.  Bulk was normal and fasciculations were absent.   Motor Tone - Muscle tone was assessed at the neck and appendages and was normal.  Reflexes - The patient's reflexes were 1+ in all extremities and she had no pathological reflexes.  Sensory - Light touch, temperature/pinprick were assessed and were symmetrical.    Coordination - The patient had normal movements in the hands with no ataxia or dysmetria.  Tremor was absent.  Gait and Station - deferred to PT    Discharge Diet    liquids  DISCHARGE PLAN  Disposition:  Transfer to [facility] for ongoing PT, OT and ST  Eliquis (apixaban) daily for secondary stroke prevention.  Recommend ongoing risk factor control by Primary Care Physician at time of discharge from inpatient rehabilitation.  Follow-up Tawanna Solo, MD in 2 weeks following discharge from rehab.  Follow-up with Dr. Rosalin Hawking,, Stroke Clinic in 2 months, office to schedule an appointment.   45 minutes were spent preparing discharge.  [place] for Pager information  I, the attending vascular  neurologist, have personally obtained a history, examined the patient, evaluated laboratory data, individually viewed imaging studies and agree with radiology interpretations.  Together with the NP/PA, we formulated the assessment and plan of care which reflects our mutual decision.  I have made any additions or clarifications directly to the above note and agree with the findings and  plan as currently documented.   Pt doing well, neuro deficit resolved. On eliquis and no GIB. Close monitoring. CIR today. Follow up in clinic.  Rosalin Hawking, MD PhD Stroke Neurology

## 2016-02-01 ENCOUNTER — Inpatient Hospital Stay (HOSPITAL_COMMUNITY): Payer: Medicare Other | Admitting: *Deleted

## 2016-02-01 ENCOUNTER — Inpatient Hospital Stay (HOSPITAL_COMMUNITY): Payer: Medicare Other | Admitting: Speech Pathology

## 2016-02-01 ENCOUNTER — Inpatient Hospital Stay (HOSPITAL_COMMUNITY): Payer: Medicare Other | Admitting: Occupational Therapy

## 2016-02-01 ENCOUNTER — Inpatient Hospital Stay (HOSPITAL_COMMUNITY): Payer: Medicare Other

## 2016-02-01 DIAGNOSIS — G8191 Hemiplegia, unspecified affecting right dominant side: Secondary | ICD-10-CM

## 2016-02-01 LAB — CBC WITH DIFFERENTIAL/PLATELET
BASOS ABS: 0 10*3/uL (ref 0.0–0.1)
Basophils Relative: 0 %
Eosinophils Absolute: 0.2 10*3/uL (ref 0.0–0.7)
Eosinophils Relative: 2 %
HEMATOCRIT: 28.4 % — AB (ref 36.0–46.0)
Hemoglobin: 9.1 g/dL — ABNORMAL LOW (ref 12.0–15.0)
LYMPHS PCT: 16 %
Lymphs Abs: 1.5 10*3/uL (ref 0.7–4.0)
MCH: 28.9 pg (ref 26.0–34.0)
MCHC: 32 g/dL (ref 30.0–36.0)
MCV: 90.2 fL (ref 78.0–100.0)
MONO ABS: 1.6 10*3/uL — AB (ref 0.1–1.0)
Monocytes Relative: 17 %
NEUTROS ABS: 6.1 10*3/uL (ref 1.7–7.7)
Neutrophils Relative %: 65 %
Platelets: 302 10*3/uL (ref 150–400)
RBC: 3.15 MIL/uL — AB (ref 3.87–5.11)
RDW: 14.2 % (ref 11.5–15.5)
WBC: 9.3 10*3/uL (ref 4.0–10.5)

## 2016-02-01 LAB — COMPREHENSIVE METABOLIC PANEL
ALK PHOS: 57 U/L (ref 38–126)
ALT: 12 U/L — ABNORMAL LOW (ref 14–54)
AST: 12 U/L — AB (ref 15–41)
Albumin: 2.5 g/dL — ABNORMAL LOW (ref 3.5–5.0)
Anion gap: 7 (ref 5–15)
BILIRUBIN TOTAL: 0.6 mg/dL (ref 0.3–1.2)
BUN: 10 mg/dL (ref 6–20)
CO2: 27 mmol/L (ref 22–32)
Calcium: 8.7 mg/dL — ABNORMAL LOW (ref 8.9–10.3)
Chloride: 103 mmol/L (ref 101–111)
Creatinine, Ser: 0.71 mg/dL (ref 0.44–1.00)
GFR calc Af Amer: >60 mL/min (ref >60)
Glucose, Bld: 98 mg/dL (ref 65–99)
Potassium: 4 mmol/L (ref 3.5–5.1)
Sodium: 137 mmol/L (ref 135–145)
Total Protein: 5.2 g/dL — ABNORMAL LOW (ref 6.5–8.1)

## 2016-02-01 NOTE — Plan of Care (Signed)
Problem: RH PAIN MANAGEMENT Goal: RH STG PAIN MANAGED AT OR BELOW PT'S PAIN GOAL Pain less than 2, on a scale from 0-10  Outcome: Not Progressing Patient is complaining of left back back 8/10. PRN tylenol given.

## 2016-02-01 NOTE — Progress Notes (Signed)
Speech Language Pathology Daily Session Note  Patient Details  Name: Adrienne [name] MRN: [id number] Date of Birth: 1928/07/18  Today's Date: 02/01/2016 SLP Individual Time: 1415-1500 SLP Individual Time Calculation (min): 45 min  Short Term Goals: Week 1: SLP Short Term Goal 1 (Week 1): STG=LTG due to ELOS   Skilled Therapeutic Interventions: Skilled treatment session focused on communication goals. SLP facilitated session by introducing compensatory strategies for word-finding deficits. Daughter Adrienne [name]) was present during session and education provided on strategies to facilitate communication and lessen word-finding deficits. Both pt and daughter demonstrated understanding. Pt able to communicate daily information and complex conversation with no word-finding deficits during this session. Pt was was returned to room, left in daughter's presence with all needs within reach. Continue current plan of care.   Function:   Cognition Comprehension Comprehension assist level: Follows complex conversation/direction with no assist  Expression   Expression assist level: Expresses basic 90% of the time/requires cueing < 10% of the time.  Social Interaction Social Interaction assist level: Interacts appropriately with others - No medications needed.  Problem Solving Problem solving assist level: Solves complex problems: With extra time  Memory Memory assist level: Assistive device: No helper    Pain    Therapy/Group: Individual Therapy  Teia Freitas 02/01/2016, 3:24 PM

## 2016-02-01 NOTE — Progress Notes (Signed)
Physical Therapy Note  Patient Details  Name: Adrienne [name] MRN: [id number] Date of Birth: December 08, 1927 Today's Date: 02/01/2016  [id number], 60 min individual tx Pain: L thoracic pian 5/10; premedicated.  PT educated pt and husband about sleeping in side lying with pillow between knees instead of always supine; note also placed over bed.  Pt has hx of T12 fx and disc problems but did fall at time of CVA.  PT will consult PA about pain.  Gait in room with [facility] to/from toilet and sink, and for toilet transfer.  PT donned pt's thigh high TEDS; pt donned shoes in sitting with supervision.  Pt stood with legs pressed against chair, keeping balance while she reached inside PJ bottoms to pull up TEDS to her groin. PT DOE during TED manipulation; recovered with seated rest break.  neuromuscular re-education via forced use, demo, VCS for seated calf raises, toe raises with 5 second holds; sit>< stand x 5 without use of bil UEs focusing on forward wt shift and cervical flexion; standing x 1 minute without UE support during lateral wt shifts while squeezing towel between knees to activate core.  [place] A exs in standing using bed rail bil UE support: 10 x 1 R/L hip abduction, R/L calf raises. Pt left resting in armchair with pillow behind back; OT entering for tx.  Pt limited at times by back pain; PT spoke with PA; he is aware of L thoracic pain, and has ordered Kpad.  Depending upon success of K pad he may order meds.  Lissandra Keil 02/01/2016, 7:58 AM

## 2016-02-01 NOTE — Progress Notes (Signed)
Patient information reviewed and entered into eRehab system by Edmar Blankenburg, RN, CRRN, PPS Coordinator.  Information including medical coding and functional independence measure will be reviewed and updated through discharge.     Per nursing patient was given "Data Collection Information Summary for Patients in Inpatient Rehabilitation Facilities with attached "Privacy Act Statement-Health Care Records" upon admission.  

## 2016-02-01 NOTE — Progress Notes (Signed)
Subjective/Complaints: No issues overnite except has soreness of Left ribs, no recent fall, hx of T12 comp fx but pt didn't recall onset No new bowel or bladder issues  ROSneg  nausea, vomiting, diarrhea, constipation,   Objective: Vital Signs: Blood pressure 146/62, pulse 74, temperature 97.8 F (36.6 C), temperature source Oral, resp. rate 18, last menstrual period 09/12/1973, SpO2 99 %. No results found. Results for orders placed or performed during the hospital encounter of 01/29/16 (from the past 72 hour(s))  CBC WITH DIFFERENTIAL     Status: Abnormal   Collection Time: 02/01/16  6:15 AM  Result Value Ref Range   WBC 9.3 4.0 - 10.5 K/uL   RBC 3.15 (L) 3.87 - 5.11 MIL/uL   Hemoglobin 9.1 (L) 12.0 - 15.0 g/dL   HCT 28.4 (L) 36.0 - 46.0 %   MCV 90.2 78.0 - 100.0 fL   MCH 28.9 26.0 - 34.0 pg   MCHC 32.0 30.0 - 36.0 g/dL   RDW 14.2 11.5 - 15.5 %   Platelets 302 150 - 400 K/uL   Neutrophils Relative % 65 %   Neutro Abs 6.1 1.7 - 7.7 K/uL   Lymphocytes Relative 16 %   Lymphs Abs 1.5 0.7 - 4.0 K/uL   Monocytes Relative 17 %   Monocytes Absolute 1.6 (H) 0.1 - 1.0 K/uL   Eosinophils Relative 2 %   Eosinophils Absolute 0.2 0.0 - 0.7 K/uL   Basophils Relative 0 %   Basophils Absolute 0.0 0.0 - 0.1 K/uL  Comprehensive metabolic panel     Status: Abnormal   Collection Time: 02/01/16  6:15 AM  Result Value Ref Range   Sodium 137 135 - 145 mmol/L   Potassium 4.0 3.5 - 5.1 mmol/L   Chloride 103 101 - 111 mmol/L   CO2 27 22 - 32 mmol/L   Glucose, Bld 98 65 - 99 mg/dL   BUN 10 6 - 20 mg/dL   Creatinine, Ser 0.71 0.44 - 1.00 mg/dL   Calcium 8.7 (L) 8.9 - 10.3 mg/dL   Total Protein 5.2 (L) 6.5 - 8.1 g/dL   Albumin 2.5 (L) 3.5 - 5.0 g/dL   AST 12 (L) 15 - 41 U/L   ALT 12 (L) 14 - 54 U/L   Alkaline Phosphatase 57 38 - 126 U/L   Total Bilirubin 0.6 0.3 - 1.2 mg/dL   GFR calc non Af Amer >60 >60 mL/min   GFR calc Af Amer >60 >60 mL/min    Comment: (NOTE) The eGFR has been  calculated using the CKD EPI equation. This calculation has not been validated in all clinical situations. eGFR's persistently <60 mL/min signify possible Chronic Kidney Disease.    Anion gap 7 5 - 15     HEENT: normal Cardio: RRR and no murmurs Resp: CTA B/L and uunlabored GI: BS positive and nontender, nondistended Extremity:  No Edema Skin:   Intact Neuro: Alert/Oriented, Normal Sensory, Normal Motor and Other no evidence of aphasia Musc/Skel:  Other tenderness over the left lower ribs Gen. no acute distress   Assessment/Plan: 1. Functional deficits secondary to left MCA infarct which require 3+ hours per day of interdisciplinary therapy in a comprehensive inpatient rehab setting. Physiatrist is providing close team supervision and 24 hour management of active medical problems listed below. Physiatrist and rehab team continue to assess barriers to discharge/monitor patient progress toward functional and medical goals. FIM: Function - Bathing Position: Shower Body parts bathed by patient: Right arm, Left arm, Chest, Abdomen, Front perineal  area, Buttocks, Right upper leg, Left upper leg, Back Body parts bathed by helper: Left lower leg, Right lower leg (washes feet at home on toilet) Assist Level: Touching or steadying assistance(Pt > 75%)  Function- Upper Body Dressing/Undressing What is the patient wearing?: Bra, Pull over shirt/dress Bra - Perfomed by patient: Thread/unthread right bra strap, Thread/unthread left bra strap, Hook/unhook bra (pull down sports bra) Pull over shirt/dress - Perfomed by patient: Thread/unthread right sleeve, Thread/unthread left sleeve, Put head through opening, Pull shirt over trunk Assist Level: Supervision or verbal cues Function - Lower Body Dressing/Undressing What is the patient wearing?: Underwear, Pants, Non-skid slipper socks Position: Wheelchair/chair at sink Underwear - Performed by patient: Thread/unthread right underwear leg,  Thread/unthread left underwear leg, Pull underwear up/down Pants- Performed by patient: Thread/unthread right pants leg, Thread/unthread left pants leg, Pull pants up/down, Fasten/unfasten pants Non-skid slipper socks- Performed by helper: Don/doff left sock, Don/doff right sock Assist for footwear: Maximal assist Assist for lower body dressing: Touching or steadying assistance (Pt > 75%)  Function - Toileting Toileting steps completed by patient: Adjust clothing prior to toileting, Performs perineal hygiene, Adjust clothing after toileting Toileting steps completed by helper: Adjust clothing prior to toileting Toileting Assistive Devices: Grab bar or rail Assist level: Touching or steadying assistance (Pt.75%), More than reasonable time  Function - Air cabin crew transfer assistive device: Grab bar Assist level to toilet: Touching or steadying assistance (Pt > 75%) Assist level from toilet: Touching or steadying assistance (Pt > 75%)  Function - Chair/bed transfer Chair/bed transfer method: Ambulatory Chair/bed transfer assist level: Touching or steadying assistance (Pt > 75%) Chair/bed transfer assistive device: Armrests, Cane Chair/bed transfer details: Verbal cues for sequencing, Verbal cues for safe use of DME/AE  Function - Locomotion: Wheelchair Will patient use wheelchair at discharge?: No Function - Locomotion: Ambulation Assistive device: Cane-straight Max distance: 150 Assist level: Touching or steadying assistance (Pt > 75%) Assist level: Touching or steadying assistance (Pt > 75%) Assist level: Touching or steadying assistance (Pt > 75%) Assist level: Touching or steadying assistance (Pt > 75%) Assist level: Touching or steadying assistance (Pt > 75%)  Function - Comprehension Comprehension: Auditory Comprehension assist level: Follows complex conversation/direction with no assist  Function - Expression Expression: Verbal Expression assist level:  Expresses basic 90% of the time/requires cueing < 10% of the time.  Function - Social Interaction Social Interaction assist level: Interacts appropriately with others - No medications needed.  Function - Problem Solving Problem solving assist level: Solves complex problems: With extra time  Function - Memory Memory assist level: More than reasonable amount of time Patient normally able to recall (first 3 days only): Current season, Location of own room, Staff names and faces, That he or she is in a hospital  Medical Problem List and Plan: 1. Right hemiparesis and gait/functional deficits secondary to left MCA infarct status post revascularization of occluded left MCA continue PT OT CIR level 2. DVT Prophylaxis/Anticoagulation: Eliquis. Monitor frequent bleeding episodes 3. Pain Management: Tylenol as needed, rib pain left side check x-rays no recent trauma  History of breast cancer 4. Hypertension/ Atrial fibrillation. Cardiac rate controlled. Cardizem 60 mg 3 times a day, Lopressor 100 mg twice a day. Monitor with increased mobility 5. Neuropsych: This patient is capable of making decisions on her own behalf. 6. Skin/Wound Care: Routine skin checks 7. Fluids/Electrolytes/Nutrition: Routine I&O's follow-up chemistries 8. Recent GI bleed requiring transfusion. Follow-up gastroenterology as needed. Continue Protonix. Follow-up CBC, hemoglobin is down from 9.8-9.1,, continue  to monitor 9. History of right breast cancer. Continue armidex.this can cause musculoskeletal pain 10. Hyperlipidemia. Lipitor  LOS (Days) 3 A FACE TO FACE EVALUATION WAS PERFORMED  KIRSTEINS,ANDREW E 02/01/2016, 8:25 AM

## 2016-02-01 NOTE — Interval H&P Note (Signed)
Adrienne [name] was admitted today to Inpatient Rehabilitation with the diagnosis of MCA infarct.  The patient's history has been reviewed, patient examined, and there is no change in status.  Patient continues to be appropriate for intensive inpatient rehabilitation.  I have reviewed the patient's chart and labs.  Questions were answered to the patient's satisfaction. The PAPE has been reviewed and assessment remains appropriate.  Adrienne [name] 02/01/2016, 8:43 AM

## 2016-02-01 NOTE — Progress Notes (Signed)
Inpatient Rehabilitation Center Individual Statement of Services  Patient Name:  Adrienne [name]  Date:  02/01/2016  Welcome to the [facility].  Our goal is to provide you with an individualized program based on your diagnosis and situation, designed to meet your specific needs.  With this comprehensive rehabilitation program, you will be expected to participate in at least 3 hours of rehabilitation therapies Monday-Friday, with modified therapy programming on the weekends.  Your rehabilitation program will include the following services:  Physical Therapy (PT), Occupational Therapy (OT), Speech Therapy (ST), 24 hour per day rehabilitation nursing, Neuropsychology, Case Management (Social Worker), Rehabilitation Medicine, Nutrition Services and Pharmacy Services  Weekly team conferences will be held on Wednesdays to discuss your progress.  Your Social Worker will talk with you frequently to get your input and to update you on team discussions.  Team conferences with you and your family in attendance may also be held.  Expected length of stay:  7 to 10 days              Overall anticipated outcome:  Modified Independent with some supervision needed for tub and car transfers, ambulation in community, and stairs  Depending on your progress and recovery, your program may change. Your Social Worker will coordinate services and will keep you informed of any changes. Your Social Worker's name and contact numbers are listed  below.  The following services may also be recommended but are not provided by the [facility] will be made to provide these services after discharge if needed.  Arrangements include referral to agencies that provide these services.  Your insurance has been verified to be:  Medicare and [place] Your primary doctor is:  Dr. Kathyrn Lass  Pertinent information will be shared with your doctor and your insurance company.  Social Worker:  Alfonse Alpers, LCSW  [phone] or [phone]  Information discussed with and copy given to patient by: Trey Sailors, 02/01/2016, 2:26 PM

## 2016-02-01 NOTE — Progress Notes (Signed)
Physical Therapy Session Note  Patient Details  Name: Adrienne [name] MRN: [id number] Date of Birth: 03-Jul-1928  Today's Date: 02/01/2016 PT Individual Time: 1330-1400 PT Individual Time Calculation (min): 30 min   Short Term Goals: Week 1:  PT Short Term Goal 1 (Week 1): =LTGs due to ELOS  Skilled Therapeutic Interventions/Progress Updates:  Tx focused on Berg Balance test assessment. PT up in room, feeling well and ready to go. Pt educated on score of 35/56 and functional and safety implications. See results below. Pt had most difficutly on items involving dynamic movements and trunk rotation. Pt left up in chair with all needs in reach.      Therapy Documentation Precautions:  Precautions Precautions: Fall Precaution Comments: pt is unsteady on her feet, family reports this was the case PTA, but more now Restrictions Weight Bearing Restrictions: No General:   Vital Signs: Therapy Vitals Temp: 97.8 F (36.6 C) Temp Source: Oral Pulse Rate: 69 Resp: 18 BP: (!) 149/66 mmHg Patient Position (if appropriate): Sitting Oxygen Therapy SpO2: 100 % O2 Device: Not Delivered Pain: 2/10 back pain. Pt premedicated       Balance: Standardized Balance Assessment Standardized Balance Assessment: Berg Balance Test Berg Balance Test Sit to Stand: Able to stand using hands after several tries Standing Unsupported: Able to stand 30 seconds unsupported Sitting with Back Unsupported but Feet Supported on Floor or Stool: Able to sit safely and securely 2 minutes Stand to Sit: Uses backs of legs against chair to control descent Transfers: Needs one person to assist Standing Unsupported with Eyes Closed: Able to stand 10 seconds with supervision Standing Ubsupported with Feet Together: Able to place feet together independently and stand for 1 minute with supervision From Standing, Reach Forward with Outstretched Arm: Can reach confidently >25 cm (10") From Standing Position, Pick up  Object from Floor: Able to pick up shoe, needs supervision From Standing Position, Turn to Look Behind Over each Shoulder: Turn sideways only but maintains balance Turn 360 Degrees: Able to turn 360 degrees safely but slowly Standing Unsupported, Alternately Place Feet on Step/Stool: Able to complete 4 steps without aid or supervision Standing Unsupported, One Foot in Front: Able to plae foot ahead of the other independently and hold 30 seconds Standing on One Leg: Able to lift leg independently and hold equal to or more than 3 seconds Total Score: 35   See Function Navigator for Current Functional Status.   Therapy/Group: Individual Therapy  Soundra Pilon 02/01/2016, 1:59 PM

## 2016-02-01 NOTE — IPOC Note (Addendum)
Overall Plan of Care [facility]) Patient Details Name: Adrienne [name] MRN: [id number] DOB: 04-02-1928  Admitting Diagnosis: LT CVA  Hospital Problems: Active Problems:   Left middle cerebral artery stroke [facility])     Functional Problem List: Nursing Other (comment), Behavior, Bladder, Bowel, Edema, Safety, Medication Management, Nutrition, Pain  PT Balance, Edema, Endurance, Motor  OT Balance, Endurance, Motor  SLP Linguistic  TR         Basic ADL's: OT Grooming, Bathing, Dressing, Toileting     Advanced  ADL's: OT Simple Meal Preparation, Laundry, Light Housekeeping     Transfers: PT Bed Mobility, Bed to Chair, Car, Manufacturing systems engineer, Metallurgist: PT Stairs, Ambulation     Additional Impairments: OT Fuctional Use of Upper Extremity  SLP Communication expression    TR      Anticipated Outcomes Item Anticipated Outcome  Self Feeding independent  Swallowing      Basic self-care  mod I  Toileting  mod I   Bathroom Transfers mod I with toilet; SBA with shower  Bowel/Bladder  continent of urine and stool, mod indip  Transfers  mod I, supervision for car  Locomotion  mod I in home, supervision in community and on stairs  Communication  independent  Cognition     Pain  managed  Safety/Judgment  mod indip   Therapy Plan: PT Intensity: Minimum of 1-2 x/day ,45 to 90 minutes PT Frequency: 5 out of 7 days PT Duration Estimated Length of Stay: 7-10 OT Intensity: Minimum of 1-2 x/day, 45 to 90 minutes OT Frequency: 5 out of 7 days OT Duration/Estimated Length of Stay: 7-10- daus SLP Intensity: Minumum of 1-2 x/day, 30 to 90 minutes SLP Frequency: 3 to 5 out of 7 days SLP Duration/Estimated Length of Stay: 7-10 days        Team Interventions: Nursing Interventions Patient/Family Education, Bladder Management, Bowel Management, Pain Management, Disease Management/Prevention, Medication Management, Discharge Planning, Psychosocial Support  PT  interventions Ambulation/gait training, Community reintegration, DME/adaptive equipment instruction, Neuromuscular re-education, Psychosocial support, Stair training, UE/LE Strength taining/ROM, UE/LE Coordination activities, Therapeutic Activities, Pain management, Discharge planning, Training and development officer, Functional mobility training, Patient/family education, Therapeutic Exercise  OT Interventions Training and development officer, Community reintegration, Discharge planning, DME/adaptive equipment instruction, Functional mobility training, Pain management, Patient/family education, Self Care/advanced ADL retraining, Therapeutic Activities, Therapeutic Exercise, UE/LE Strength taining/ROM  SLP Interventions Internal/external aids, Patient/family education, Speech/Language facilitation  TR Interventions    SW/CM Interventions Discharge Planning, Psychosocial Support, Patient/Family Education    Team Discharge Planning: Destination: PT-Home ,OT- Home , SLP-Home Projected Follow-up: PT-Home health PT, OT-  Home health OT, SLP-None Projected Equipment Needs: PT-Cane, OT- To be determined, SLP-None recommended by SLP Equipment Details: PT-if pt does not already have SPC, OT-  Patient/family involved in discharge planning: PT- Patient, Family member/caregiver,  OT-Patient, Family member/caregiver, SLP-Patient, Family member/caregiver  MD ELOS: 7d Medical Rehab Prognosis:  Excellent Assessment: 82 y.o.right handed female with history ofRight breast cancer, CAD, atrial fibrillation with chronic Coumadin. Patient lives with spouse. One level home. Used a occasional cane prior to admission. Patient recently relocated from [facility] to [place]. Recently admitted 01/07/2016 with intermittent bloody stools and upper abdominal pain. CT showed distal diverticulosis otherwise unrevealing. MRI of the spine showed T12 compression fracture as well as some disc protrusions. INR admission of 2.5 and  received vitamin K. Hemoglobin 13-9. Underwent upper endoscopy per gastroenterology that showed small hiatal hernia. A medium nonbleeding diverticulum found in the area of the papilla.  Patient's anticoagulation remained on hold and was discharged to home 01/10/2016 and plan to follow up outpatient with GI. Presented 01/25/2016 with right-sided weakness after being found down in the bathroom by her husband. Her Coumadin had recently been restarted one week prior. INR on admission 1.5. CT showed atrophy small vessel disease in the periventricular white matter. Evidence of prior infarct in the lateral left lentiform nucleus. CT angiogram head and neck positive for emergent large vessel occlusion at the left MCA bifurcation. Superimposed atherosclerotic stenosis left ICA origin left supraclinoid ICA. Marland Kitchen Underwent revascularization of occluded left MCA per interventional radiology. Maintained for a short time on ventilator. MRI showed left MCA territory infarct. Echocardiogram with ejection fraction of 65% no wall motion abnormalities. Neurology consulted presently maintained on aspirin therapy and transition to Eliquis 01/28/2016 with no plan to resume Coumadin   Now requiring 24/7 Rehab RN,MD, as well as CIR level PT, OT and SLP.  Treatment team will focus on ADLs and mobility with goals set at Mod I  See Team Conference Notes for weekly updates to the plan of care

## 2016-02-01 NOTE — Progress Notes (Addendum)
Occupational Therapy Session Note  Patient Details  Name: Adrienne [name] MRN: [id number] Date of Birth: [birth year]  Today's Date: 02/01/2016 OT Individual Time:  -   1100-1215  (75 min)      Short Term Goals: Week 1:  OT Short Term Goal 1 (Week 1): STG= LTG      Skilled Therapeutic Interventions/Progress Updates:    Skilled Therapeutic Intervention Pt sitting in wc finishing up the previous therapy.   Pt agreed tx shower. Ambulated with [facility] and [facility]HA to gather clothes out of closet.   Pt needed min assist for reaching into closet and placing clothes on walker.  Ambulate to toilet.  Pt continent of Bowel.  Ambulated to shower with RW.  Pt performed sit to stand x 5 during shoere using grab bar for supportt.  Pt has grab bar at home and Tub transfer bench for sitting.  She also has 2 fixed seats in shower but she has a difficult time from sliding off them.   Pt demonstrated safety and awareness during activity.  Pt donned clothes with SBA except for TED stockings.  Ambulated to wc by the bed.   Husband present.  Left pt in wc with calll bell,phone within reach.    Addressed balance, transfers, strength and endurance. Pt had 2/4 dyspnea today which was much better than yesterday.  Pt could benefit from walker bag.   Therapy Documentation Precautions:  Precautions Precautions: Fall Precaution Comments: pt is unsteady on her feet, family reports this was the case PTA, but more now Restrictions Weight Bearing Restrictions: No      Pain:  5/10   Left side   See Function Navigator for Current Functional Status.   Therapy/Group: Individual Therapy  Lisa Roca 02/01/2016, 8:55 AM

## 2016-02-01 NOTE — H&P (View-Only) (Signed)
Physical Medicine and Rehabilitation Admission H&P    Chief Complaint  Patient presents with  . Code Stroke  : HPI: Adrienne [name] is a 82 y.o.right handed female with history ofRight breast cancer, CAD, atrial fibrillation with chronic Coumadin. Patient lives with spouse. One level home. Used a occasional cane prior to admission. Patient recently relocated from [facility] to [place]. Recently admitted 01/07/2016 with intermittent bloody stools and upper abdominal pain. CT showed distal diverticulosis otherwise unrevealing. MRI of the spine showed T12 compression fracture as well as some disc protrusions. INR admission of 2.5 and received vitamin K. Hemoglobin 13-9. Underwent upper endoscopy per gastroenterology that showed small hiatal hernia. A medium nonbleeding diverticulum found in the area of the papilla. Patient's anticoagulation remained on hold and was discharged to home 01/10/2016 and plan to follow up outpatient with GI. Presented 01/25/2016 with right-sided weakness after being found down in the bathroom by her husband. Her Coumadin had recently been restarted one week prior. INR on admission 1.5. CT showed atrophy small vessel disease in the periventricular white matter. Evidence of prior infarct in the lateral left lentiform nucleus. CT angiogram head and neck positive for emergent large vessel occlusion at the left MCA bifurcation. Superimposed atherosclerotic stenosis left ICA origin left supraclinoid ICA. Marland Kitchen Underwent revascularization of occluded left MCA per interventional radiology. Maintained for a short time on ventilator. MRI showed left MCA territory infarct. Echocardiogram with ejection fraction of 65% no wall motion abnormalities. Neurology consulted presently maintained on aspirin therapy and transition to Eliquis 01/28/2016 with no plan to resume Coumadin. Hemoglobin and hematocrit remained stable. Physical And occupational therapy evaluations completed 01/27/2016  with recommendations of physical medicine rehabilitation consult.Patient was admitted for a comprehensive rehabilitation program  ROS Constitutional: Negative for fever and chills.  HENT: Negative for hearing loss.  Eyes: Negative for blurred vision and double vision.  Respiratory: Negative for cough and shortness of breath.  Cardiovascular: Positive for palpitations and leg swelling.  Gastrointestinal: Positive for abdominal pain, diarrhea and constipation. Negative for nausea.  Genitourinary: Positive for urgency. Negative for dysuria and hematuria.  Musculoskeletal: Positive for back pain and falls.  Neurological: Positive for weakness. Negative for seizures and headaches.  All other systems reviewed and are negative   Past Medical History  Diagnosis Date  . Afib ([place])   . Elevated uric acid in blood   . Arthritis of ankle   . Pulmonary HTN ([place])   . Long-term (current) use of anticoagulants   . Moderate mitral regurgitation   . CAD (coronary artery disease)   . Edema   . Cancer [facility]) 2012    breast cancer-right  . Hypertension   . Urinary incontinence   . TIA (transient ischemic attack) 09/2011  . Diverticulosis   . Compression fracture of T12 vertebra [facility])    Past Surgical History  Procedure Laterality Date  . Cardiac catheterization  2013  . Replacement total knee bilateral    . Gallbladder surgery    . Appendectomy    . Mastectomy Right   . Cataract extraction, bilateral    . Breast surgery  2012    Rt.mastectomy--[place], [place]  . Tonsillectomy and adenoidectomy      as a child  . Dilation and curettage of uterus      in her early 65's for DUB  . Esophagogastroduodenoscopy Left 01/08/2016    Procedure: ESOPHAGOGASTRODUODENOSCOPY (EGD);  Surgeon: Arta Silence, MD;  Location: [facility];  Service: Endoscopy;  Laterality: Left;  . Radiology  with anesthesia N/A 01/25/2016    Procedure: RADIOLOGY WITH ANESTHESIA;  Surgeon: Luanne Bras, MD;  Location: [place];  Service: Radiology;  Laterality: N/A;   Family History  Problem Relation Age of Onset  . Asthma Mother   . CVA Father   . Atrial fibrillation Sister     HAS PACER  . Pneumonia Brother   . Cancer Brother     BREAST CANCER  . Cancer Daughter     COLON CANCER   Social History:  reports that she has quit smoking. Her smoking use included Cigarettes. She does not have any smokeless tobacco history on file. She reports that she does not drink alcohol or use illicit drugs. Allergies:  Allergies  Allergen Reactions  . Amiodarone Hcl [Amiodarone]     AFIB  . Compazine [Prochlorperazine]     HYPER  . Other Other (See Comments)    "Kidney dye"--patient states this gave her severe shakes/chills  . Thorazine [Chlorpromazine]     HYPER  . Valium [Diazepam]     HYPER   . Zometa [Zoledronic Acid]     PROBLEMS WITH EYES   Medications Prior to Admission  Medication Sig Dispense Refill  . acetaminophen (TYLENOL) 500 MG tablet Take 500-1,000 mg by mouth every 12 (twelve) hours as needed (pain).     Marland Kitchen anastrozole (ARIMIDEX) 1 MG tablet Take 1 mg by mouth daily.    Marland Kitchen CALCIUM PO Take 1 tablet by mouth daily at 3 pm.    . Cholecalciferol (VITAMIN D PO) Take 1 tablet by mouth daily.     Marland Kitchen diltiazem (CARDIZEM) 60 MG tablet Take 1 tablet (60 mg total) by mouth 3 (three) times daily. (Patient taking differently: Take 60 mg by mouth 3 (three) times daily. ) 270 tablet 3  . HYDROcodone-acetaminophen (NORCO/VICODIN) 5-325 MG tablet Take 1 tablet by mouth every 8 (eight) hours as needed for moderate pain.    . metoprolol (LOPRESSOR) 100 MG tablet Take 1 tablet (100 mg total) by mouth 2 (two) times daily. 180 tablet 3  . omeprazole (PRILOSEC) 40 MG capsule Take 40 mg by mouth daily.     Marland Kitchen trimethoprim (TRIMPEX) 100 MG tablet Take 100 mg by mouth daily.   11  . warfarin (COUMADIN) 4 MG tablet Take 4 mg by mouth daily at 3 pm. Name Brand      Home: Home Living Family/patient expects to be  discharged to:: Private residence Living Arrangements: Spouse/significant other Available Help at Discharge: Family Type of Home: House Home Access: Stairs to enter Technical brewer of Steps: 1 stoop Entrance Stairs-Rails: None Home Layout: One level Bathroom Shower/Tub: Multimedia programmer: Standard Home Equipment: Civil engineer, contracting - built in, Engineer, manufacturing held shower head, Radio producer - single point Additional Comments: pt with incr balance deficits very recently  Lives With: Spouse   Functional History: Prior Function Level of Independence: Needs assistance Gait / Transfers Assistance Needed: per family, husband would carry her purse and provide his arm for balance during community gait.  ADL's / Homemaking Assistance Needed: independent  Functional Status:  Mobility: Bed Mobility Overal bed mobility: Needs Assistance Bed Mobility: Supine to Sit Supine to sit: Supervision General bed mobility comments: SUpervision for safety. use of rail for support.  Transfers Overall transfer level: Needs assistance Equipment used: 1 person hand held assist Transfers: Sit to/from Stand Sit to Stand: Min guard General transfer comment: Min guard for safety. Stood from [employer], from toilet x1. Transferred to chair post ambulation bout.  CUes for controlled descent.  Ambulation/Gait Ambulation/Gait assistance: Min assist Ambulation Distance (Feet): 100 Feet (x2 bouts) Assistive device: 1 person hand held assist, None Gait Pattern/deviations: Step-through pattern, Decreased stride length, Staggering right General Gait Details: Min A for balance throughout gait training. Cues for forward gaze. Staggering at times. 1 seated rest break. Gait velocity: decreased    ADL: ADL Overall ADL's : Needs assistance/impaired Grooming: Wash/dry hands, Minimal assistance, Bed level Upper Body Bathing: Minimal assitance, Bed level Toilet Transfer: +2 for physical assistance, Moderate assistance Functional  mobility during ADLs: Moderate assistance, +2 for physical assistance General ADL Comments: Pt reports dizziness and need to sit. BP stable and monitored. pt with visual deficits affecting all adls see below  Cognition: Cognition Overall Cognitive Status: Within Functional Limits for tasks assessed (able to state her son was coming today and remembered what time his flight arrived. ) Arousal/Alertness: Awake/alert Orientation Level: Oriented X4 Attention: Alternating Alternating Attention: Appears intact Memory: Appears intact Awareness: Appears intact Problem Solving: Appears intact Executive Function: Reasoning, Sequencing, Organizing, Decision Making, Initiating, Self Monitoring, Self Correcting Reasoning: Appears intact Sequencing: Appears intact Organizing: Appears intact Decision Making: Appears intact Initiating: Appears intact Self Monitoring: Appears intact Self Correcting: Appears intact Safety/Judgment: Appears intact [employer] Scales of Cognitive Functioning: Purposeful/appropriate Cognition Arousal/Alertness: Awake/alert Behavior During Therapy: WFL for tasks assessed/performed Overall Cognitive Status: Within Functional Limits for tasks assessed (able to state her son was coming today and remembered what time his flight arrived. ) Area of Impairment: Orientation Orientation Level: Time  Physical Exam: Blood pressure 157/75, pulse 75, temperature 97.5 F (36.4 C), temperature source Oral, resp. rate 20, height _0  (1.702 m), weight 66.3 kg (146 lb 2.6 oz), last menstrual period 09/12/1973, SpO2 98 %. Physical Exam Constitutional: She is oriented to person, place, and time. She appears well-developed.  HENT: oral mucosa pink and moist Head: Normocephalic.  Eyes: EOM are normal.  Neck: Normal range of motion. Neck supple. No thyromegaly present.  Cardiovascular:  Cardiac rate controlled. No murmurs or rubs Respiratory: Effort normal and breath sounds  normal. No wheezes rales or rhonchi GI: Soft. Bowel sounds are normal. She exhibits no distension.  Neurological: She is alert and oriented to person, place, and time.  Follows simple commands. Occasional word finding deficits but able to communicate fairly effectively.  No CN findings, (no diplopia today). No nystagmus Motor strength is 4/5 bilateral deltoid, biceps, triceps, grip, hip flexor, knee extensor, ankle dorsiflexor and plantar flexor. Slightly decreased [place] R>L UE's.  No sensory abnl seen. Musc: She has no pain with hip or knee range of motion. She is full shoulder range of motion. She is evidence of osteoarthritis in bilateral first carpometacarpal joints. Psych: pleasant and appropriate.    Results for orders placed or performed during the hospital encounter of 01/25/16 (from the past 48 hour(s))  Glucose, capillary     Status: Abnormal   Collection Time: 01/26/16  4:05 PM  Result Value Ref Range   Glucose-Capillary 103 (H) 65 - 99 mg/dL  Glucose, capillary     Status: None   Collection Time: 01/27/16 12:02 AM  Result Value Ref Range   Glucose-Capillary 98 65 - 99 mg/dL  CBC     Status: Abnormal   Collection Time: 01/27/16  3:40 AM  Result Value Ref Range   WBC 11.8 (H) 4.0 - 10.5 K/uL   RBC 3.03 (L) 3.87 - 5.11 MIL/uL   Hemoglobin 8.9 (L) 12.0 - 15.0 g/dL   HCT  27.3 (L) 36.0 - 46.0 %   MCV 90.1 78.0 - 100.0 fL   MCH 29.4 26.0 - 34.0 pg   MCHC 32.6 30.0 - 36.0 g/dL   RDW 14.8 11.5 - 15.5 %   Platelets 314 150 - 400 K/uL  Basic metabolic panel     Status: Abnormal   Collection Time: 01/27/16  3:40 AM  Result Value Ref Range   Sodium 143 135 - 145 mmol/L   Potassium 3.8 3.5 - 5.1 mmol/L   Chloride 112 (H) 101 - 111 mmol/L   CO2 22 22 - 32 mmol/L   Glucose, Bld 90 65 - 99 mg/dL   BUN 9 6 - 20 mg/dL   Creatinine, Ser 0.75 0.44 - 1.00 mg/dL   Calcium 8.3 (L) 8.9 - 10.3 mg/dL   GFR calc non Af Amer >60 >60 mL/min   GFR calc Af Amer >60 >60 mL/min    Comment:  (NOTE) The eGFR has been calculated using the CKD EPI equation. This calculation has not been validated in all clinical situations. eGFR's persistently <60 mL/min signify possible Chronic Kidney Disease.    Anion gap 9 5 - 15  Glucose, capillary     Status: None   Collection Time: 01/27/16  8:25 AM  Result Value Ref Range   Glucose-Capillary 67 65 - 99 mg/dL   Comment 1 Notify RN    Comment 2 Document in Chart   Glucose, capillary     Status: Abnormal   Collection Time: 01/27/16  9:05 AM  Result Value Ref Range   Glucose-Capillary 63 (L) 65 - 99 mg/dL   Comment 1 Notify RN    Comment 2 Document in Chart   Glucose, capillary     Status: None   Collection Time: 01/27/16  9:38 AM  Result Value Ref Range   Glucose-Capillary 79 65 - 99 mg/dL  CBC     Status: Abnormal   Collection Time: 01/27/16  3:39 PM  Result Value Ref Range   WBC 10.6 (H) 4.0 - 10.5 K/uL   RBC 3.32 (L) 3.87 - 5.11 MIL/uL   Hemoglobin 10.0 (L) 12.0 - 15.0 g/dL   HCT 30.5 (L) 36.0 - 46.0 %   MCV 91.9 78.0 - 100.0 fL   MCH 30.1 26.0 - 34.0 pg   MCHC 32.8 30.0 - 36.0 g/dL   RDW 15.0 11.5 - 15.5 %   Platelets 274 150 - 400 K/uL  Glucose, capillary     Status: None   Collection Time: 01/27/16  4:25 PM  Result Value Ref Range   Glucose-Capillary 73 65 - 99 mg/dL  CBC     Status: Abnormal   Collection Time: 01/28/16  7:51 AM  Result Value Ref Range   WBC 9.6 4.0 - 10.5 K/uL   RBC 3.52 (L) 3.87 - 5.11 MIL/uL   Hemoglobin 10.1 (L) 12.0 - 15.0 g/dL   HCT 32.1 (L) 36.0 - 46.0 %   MCV 91.2 78.0 - 100.0 fL   MCH 28.7 26.0 - 34.0 pg   MCHC 31.5 30.0 - 36.0 g/dL   RDW 14.9 11.5 - 15.5 %   Platelets 367 150 - 400 K/uL  Basic metabolic panel     Status: None   Collection Time: 01/28/16  7:51 AM  Result Value Ref Range   Sodium 141 135 - 145 mmol/L   Potassium 4.7 3.5 - 5.1 mmol/L   Chloride 105 101 - 111 mmol/L   CO2 23 22 - 32 mmol/L  Glucose, Bld 97 65 - 99 mg/dL   BUN 13 6 - 20 mg/dL   Creatinine, Ser 0.68  0.44 - 1.00 mg/dL   Calcium 9.0 8.9 - 10.3 mg/dL   GFR calc non Af Amer >60 >60 mL/min   GFR calc Af Amer >60 >60 mL/min    Comment: (NOTE) The eGFR has been calculated using the CKD EPI equation. This calculation has not been validated in all clinical situations. eGFR's persistently <60 mL/min signify possible Chronic Kidney Disease.    Anion gap 13 5 - 15   Mr Brain Wo Contrast  01/27/2016  CLINICAL DATA:  81 year old female who presented with emergent large vessel occlusion at the left MCA bifurcation status post Neuro-intervention, clot retrieval. Initial encounter. EXAM: MRI HEAD WITHOUT CONTRAST TECHNIQUE: Multiplanar, multiecho pulse sequences of the brain and surrounding structures were obtained without intravenous contrast. COMPARISON:  Post intervention head CT 01/25/2016, and earlier. FINDINGS: A small volume of cortical restricted diffusion is noted along the left sylvian fissure including the posterior left insula (series 4, image 22), as well as in the more posterior left temporal lobe and inferior parietal lobe. There is minimal posterior left periventricular white matter restriction. Occasional punctate areas of restricted diffusion over both superior convexities (e.g. Series 4, image 41), but no other confluent restricted diffusion. Major intracranial vascular flow voids are preserved. No acute intracranial hemorrhage identified. No midline shift, mass effect, evidence of mass lesion, ventriculomegaly, or extra-axial collection. Cervicomedullary junction and pituitary are within normal limits. Patchy bilateral cerebral white matter T2 and FLAIR hyperintensity. Small chronic cortically based infarct in the right parietal lobe. Deep gray matter nuclei, brainstem, and cerebellum are normal for age. Visible internal auditory structures appear normal. Negative visualized cervical spine. Postoperative changes to the globes. Otherwise negative orbit and scalp soft tissues. Visualized bone  marrow signal is within normal limits. IMPRESSION: 1. Only a small volume of left MCA territory infarction occurred, primarily along the left sylvian fissure. No hemorrhage or mass effect. 2. Occasional other punctate areas of restricted diffusion over both superior convexities, probably neuro-intervention related. Electronically Signed   By: Genevie Ann M.D.   On: 01/27/2016 07:14       Medical Problem List and Plan: 1.  Right hemiparesis and gait/functional deficits secondary to left MCA infarct status post revascularization of occluded left MCA 2.  DVT Prophylaxis/Anticoagulation: Eliquis. Monitor frequent bleeding episodes 3. Pain Management: Tylenol as needed 4. Hypertension/ Atrial fibrillation. Cardiac rate controlled. Cardizem 60 mg 3 times a day, Lopressor 100 mg twice a day. Monitor with increased mobility 5. Neuropsych: This patient is capable of making decisions on her own behalf. 6. Skin/Wound Care: Routine skin checks 7. Fluids/Electrolytes/Nutrition: Routine I&O's follow-up chemistries 8. Recent GI bleed requiring transfusion. Follow-up gastroenterology as needed. Continue Protonix. Follow-up CBC 9. History of right breast cancer. Continue armidex. 10. Hyperlipidemia. Lipitor   Post Admission Physician Evaluation: 1. Functional deficits secondary  to left MCA infarct. 2. Patient is admitted to receive collaborative, interdisciplinary care between the physiatrist, rehab nursing staff, and therapy team. 3. Patient's level of medical complexity and substantial therapy needs in context of that medical necessity cannot be provided at a lesser intensity of care such as a SNF. 4. Patient has experienced substantial functional loss from his/her baseline which was documented above under the "Functional History" and "Functional Status" headings.  Judging by the patient's diagnosis, physical exam, and functional history, the patient has potential for functional progress which will result in  measurable gains while on inpatient rehab.  These gains will be of substantial and practical use upon discharge  in facilitating mobility and self-care at the household level. 5. Physiatrist will provide 24 hour management of medical needs as well as oversight of the therapy plan/treatment and provide guidance as appropriate regarding the interaction of the two. 6. 24 hour rehab nursing will assist with bladder management, bowel management, safety, skin/wound care, disease management, medication administration, pain management and patient education  and help integrate therapy concepts, techniques,education, etc. 7. PT will assess and treat for/with: Lower extremity strength, range of motion, stamina, balance, functional mobility, safety, adaptive techniques and equipment, NMR, visual-spatial awareness, education.   Goals are: mod I . 8. OT will assess and treat for/with: ADL's, functional mobility, safety, upper extremity strength, adaptive techniques and equipment, NMR, ego support, community reintegration.   Goals are: mod I. Therapy may proceed with showering this patient. 9. SLP will assess and treat for/with: speech/language.  Goals are: mod I. 10. Case Management and Social Worker will assess and treat for psychological issues and discharge planning. 11. Team conference will be held weekly to assess progress toward goals and to determine barriers to discharge. 12. Patient will receive at least 3 hours of therapy per day at least 5 days per week. 13. ELOS: 7 days       14. Prognosis:  excellent     Meredith Staggers, MD, [facility] 01/29/2016

## 2016-02-02 ENCOUNTER — Inpatient Hospital Stay (HOSPITAL_COMMUNITY): Payer: Medicare Other | Admitting: Physical Therapy

## 2016-02-02 ENCOUNTER — Inpatient Hospital Stay (HOSPITAL_COMMUNITY): Payer: Medicare Other | Admitting: Occupational Therapy

## 2016-02-02 ENCOUNTER — Inpatient Hospital Stay (HOSPITAL_COMMUNITY): Payer: Medicare Other | Admitting: Speech Pathology

## 2016-02-02 ENCOUNTER — Inpatient Hospital Stay (HOSPITAL_COMMUNITY): Payer: Medicare Other

## 2016-02-02 DIAGNOSIS — K5791 Diverticulosis of intestine, part unspecified, without perforation or abscess with bleeding: Secondary | ICD-10-CM

## 2016-02-02 DIAGNOSIS — R0781 Pleurodynia: Secondary | ICD-10-CM

## 2016-02-02 MED ORDER — TRAMADOL-ACETAMINOPHEN 37.5-325 MG PO TABS
1.0000 | ORAL_TABLET | Freq: Four times a day (QID) | ORAL | Status: DC | PRN
  Administered 2016-02-02 – 2016-02-05 (×7): 1 via ORAL
  Filled 2016-02-02 (×8): qty 1

## 2016-02-02 MED ORDER — TROLAMINE SALICYLATE 10 % EX CREA
TOPICAL_CREAM | Freq: Two times a day (BID) | CUTANEOUS | Status: DC | PRN
  Filled 2016-02-02: qty 85

## 2016-02-02 MED ORDER — MUSCLE RUB 10-15 % EX CREA
TOPICAL_CREAM | Freq: Two times a day (BID) | CUTANEOUS | Status: DC | PRN
  Administered 2016-02-02: 09:00:00 via TOPICAL
  Filled 2016-02-02: qty 85

## 2016-02-02 NOTE — Progress Notes (Signed)
Social Work Assessment and Plan  Patient Details  Name: Adrienne [name] MRN: [id number] Date of Birth: Jan 13, 1928  Today's Date: 02/01/2016  Problem List:  Patient Active Problem List   Diagnosis Date Noted  . Diplopia 01/29/2016  . GERD (gastroesophageal reflux disease) 01/29/2016  . Left middle cerebral artery stroke ([place]) 01/29/2016  . Persistent atrial fibrillation ([place])   . Gastrointestinal hemorrhage associated with intestinal diverticulosis   . Gait disturbance, post-stroke 01/27/2016  . Fall   . Secondary hypertension, unspecified   . Cerebral infarction due to thrombosis of left middle cerebral artery (HCC) s/p mechanical thrombectomy   . Malnutrition of moderate degree 01/09/2016  . GI bleed 01/06/2016  . Chronic atrial fibrillation ([place]) 06/10/2015  . Pulmonary hypertension, secondary ([place]) 06/10/2015  . Chronic anticoagulation 06/10/2015  . Hyperlipidemia 06/10/2015  . Essential hypertension 06/10/2015  . History of stroke 06/10/2015  . Coronary artery disease due to lipid rich plaque 06/10/2015  . Afib ([place])   . Elevated uric acid in blood   . Arthritis of ankle   . Pulmonary HTN ([place])    Past Medical History:  Past Medical History  Diagnosis Date  . Afib ([place])   . Elevated uric acid in blood   . Arthritis of ankle   . Pulmonary HTN ([place])   . Long-term (current) use of anticoagulants   . Moderate mitral regurgitation   . CAD (coronary artery disease)   . Edema   . Cancer [facility]) 2012    breast cancer-right  . Hypertension   . Urinary incontinence   . TIA (transient ischemic attack) 09/2011  . Diverticulosis   . Compression fracture of T12 vertebra [facility])    Past Surgical History:  Past Surgical History  Procedure Laterality Date  . Cardiac catheterization  2013  . Replacement total knee bilateral    . Gallbladder surgery    . Appendectomy    . Mastectomy Right   . Cataract extraction, bilateral    . Breast surgery  2012   Rt.mastectomy--[place]  . Tonsillectomy and adenoidectomy      as a child  . Dilation and curettage of uterus      in her early 75's for DUB  . Esophagogastroduodenoscopy Left 01/08/2016    Procedure: ESOPHAGOGASTRODUODENOSCOPY (EGD);  Surgeon: Arta Silence, MD;  Location: [facility];  Service: Endoscopy;  Laterality: Left;  . Radiology with anesthesia N/A 01/25/2016    Procedure: RADIOLOGY WITH ANESTHESIA;  Surgeon: Luanne Bras, MD;  Location: [facility];  Service: Radiology;  Laterality: N/A;   Social History:  reports that she has quit smoking. Her smoking use included Cigarettes. She does not have any smokeless tobacco history on file. She reports that she does not drink alcohol or use illicit drugs.  Family / Support Systems Marital Status: Married How Long?: 24 years Patient Roles: Spouse, Parent, Other (Comment) (grandparent; great grandparent) Spouse/Significant Other: Miu Chiong - husband - [phone] (h);  [phone] (m) Children: Rudi Heap - dtr - [phone] Other Supports: 2 sons who live out of state Anticipated Caregiver: spouse Ability/Limitations of Caregiver: going to PT for tendonitis in knee, but is able to provide care pt will need at home Caregiver Availability: 24/7 Family Dynamics: close, supportive family  Social History Preferred language: English Religion: Unknown Read: Yes Write: Yes Employment Status: Retired Date Retired/Disabled/Unemployed: Pt has been retired for a while.  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  Legal History/Current Legal Issues: none reported Guardian/Conservator: N/A - MD has stated that pt is able to make her own decisions.   Abuse/Neglect Physical Abuse: Denies Verbal Abuse: Denies Sexual Abuse: Denies Exploitation of patient/patient's resources: Denies Self-Neglect: Denies  Emotional Status Pt's affect, behavior and adjustment  status: Pt was in good spirits and is grateful that her stroke was not any worse and that she lived through the experience.  She is highly motivated to work hard and rehabilitate so that she can be successful at home. Recent Psychosocial Issues: Pt was off of her coumadin due to a GI bleed she'd had weeks prior to her stroke.  She resumed it, but not in time to prevent the stroke, especially with her a fib. Psychiatric History: none reported Substance Abuse History: none reported  Patient / Family Perceptions, Expectations & Goals Pt/Family understanding of illness & functional limitations: Pt/husband report a good understanding of her condition and do not have any unanswered questions for the medical team. Premorbid pt/family roles/activities: Pt enjoys simple flower planting, reading, and spending time with family. Anticipated changes in roles/activities/participation: Pt hopes to resume the above activities as she is able. Pt/family expectations/goals: Pt wants to strengthen her core and continue her exercises at home after Rehab, as she now understands the importance of getting stronger.  Community [employer] Agencies: None Premorbid Home Care/DME Agencies: Other (Comment) (Pt has a single point cane at home that husband will bring in for therapists to look at.  Has a built in Civil engineer, contracting.) Transportation available at discharge: husband Resource referrals recommended: Support group (specify) (Stroke Support Group)  Discharge Planning Living Arrangements: Spouse/significant other Support Systems: Spouse/significant other, Children, Other relatives, Friends/neighbors Type of Residence: Private residence Insurance Resources: [employer], Multimedia programmer (specify) Web designer) Financial Resources: Radio broadcast assistant Screen Referred: No Money Management: Spouse Does the patient have any problems obtaining your medications?: No Home Management: Pt and husband share these  responsibilites, but husband has handle things while she recovers. Patient/Family Preliminary Plans: Pt plans to return to her home with her husband to provide 24/7 supervision. Barriers to Discharge: Steps (1 stoop) Social Work Anticipated Follow Up Needs: HH/OP, Support Group Expected length of stay: 7 to 10 days  Clinical Impression CSW met with pt and her husband to introduce self and CSW role, as well as to complete assessment.  Pt was already very appreciative of the care she has received on CIR and is glad she was able to transfer here.  Husband was in agreement.  Pt/husband have only been in the [place] area for about a year, moving from [place] to be near their dtr.  Pt is very motivated to work hard and get stronger and does not want this to happen again, so she will make necessary changes.  Husband is in relatively good health and he feels he will be able to help her recover when she gets home and provide the necessary care.  Most of pt's needs are for supervision only.  Pt's dtr is a good support for them, as well.  Sons both live out of state, but one visited while pt was in CIR and this boosted her spirits.  Overall, pt reports feeling well emotionally and appreciative of being alive.  Pt has a single point cane at home and a bedside commode.  Husband plans to bring cane for PT to assess and make sure it is good for pt to use.  CSW will assist them with d/c planning/arrangements and  continue to follow for support.  Garin Mata, Silvestre Mesi 02/02/2016, 12:37 PM

## 2016-02-02 NOTE — Plan of Care (Signed)
Problem: RH PAIN MANAGEMENT Goal: RH STG PAIN MANAGED AT OR BELOW PT'S PAIN GOAL Pain less than 2, on a scale from 0-10  Outcome: Not Progressing Left back pain 5/10 or higher. PRN tylenol given.

## 2016-02-02 NOTE — Progress Notes (Signed)
Speech Language Pathology Daily Session Note  Patient Details  Name: Adrienne [name] MRN: [id number] Date of Birth: 07-10-1928  Today's Date: 02/02/2016 SLP Individual Time: 1500-1530 SLP Individual Time Calculation (min): 30 min  Short Term Goals: Week 1: SLP Short Term Goal 1 (Week 1): STG=LTG due to ELOS   Skilled Therapeutic Interventions: Pt seen upright in chair. Was sick to her stomach after PT, but agreeable to speech therapy. Reviewed compensatory strategies for episodes of difficulty with word finding- pt able to recall 4/4 strategies provided by therapist on the previous day. Complex word finding task completed with description within a category. One episode of possible semantic paraphasia noted- tissue for flesh. However that may have been an attempt at circumlocution.   Function:  Eating Eating                 Cognition Comprehension Comprehension assist level: Follows complex conversation/direction with no assist  Expression   Expression assist level: Expresses complex ideas: With extra time/assistive device  Social Interaction Social Interaction assist level: Interacts appropriately with others - No medications needed.  Problem Solving Problem solving assist level: Solves complex problems: With extra time  Memory Memory assist level: Complete Independence: No helper    Pain Pain Assessment Pain Assessment: No/denies pain Pain Score: 3  Pain Type: Acute pain Pain Location: Back Pain Orientation: Mid Pain Descriptors / Indicators: Aching Pain Onset: On-going Patients Stated Pain Goal: 2 Pain Intervention(s): Repositioned;Ambulation/increased activity Multiple Pain Sites: No  Therapy/Group: Individual Therapy  Vinetta Bergamo MA, CCC-SLP 02/02/2016, 4:14 PM

## 2016-02-02 NOTE — Progress Notes (Signed)
Occupational Therapy Session Note  Patient Details  Name: Norely Spoerl MRN: [id number] Date of Birth: 07-21-1928  Today's Date: 02/02/2016 OT Individual Time: 1100-1200 OT Individual Time Calculation (min): 60 min    Short Term Goals: Week 1:  OT Short Term Goal 1 (Week 1): STG= LTG  Skilled Therapeutic Interventions/Progress Updates:    Pt seen for skilled OT to facilitate dynamic balance and activity tolerance with ADL retraining. Pt encouraged to use her cane to ambulate around her room to retrieve clothing from closet, items from drawers. Pt did not need any cues, she used her cane with S for all tasks this am with no LOB. Pt able to stand in shower for short periods of time to wash and dry off with S only.  Discussed her other activities at home (she was very active) and her therapy goals. Pt stated she felt much stronger today and better due to less back pain. Pt's spouse present for the OT session.    Therapy Documentation Precautions:  Precautions Precautions: Fall Precaution Comments: pt is unsteady on her feet, family reports this was the case PTA, but more now Restrictions Weight Bearing Restrictions: No       Pain: Pain Score: 4  Faces Pain Scale: Hurts a little bit Pain Type: Acute pain Pain Location: Flank Pain Orientation: Left Pain Descriptors / Indicators: Aching;Discomfort Pain Intervention(s): Medication (See eMAR) ADL:   See Function Navigator for Current Functional Status.   Therapy/Group: Individual Therapy  Corissa Oguinn 02/02/2016, 12:55 PM

## 2016-02-02 NOTE — Progress Notes (Signed)
Speech Language Pathology Daily Session Note  Patient Details  Name: Adrienne [name] MRN: [id number] Date of Birth: 1928-05-23  Today's Date: 02/02/2016 SLP Individual Time: 1000-1100 SLP Individual Time Calculation (min): 60 min  Short Term Goals: Week 1: SLP Short Term Goal 1 (Week 1): STG=LTG due to ELOS   Skilled Therapeutic Interventions:  Pt was seen for skilled ST targeting communication goals.  SLP facilitated the session with a semi-complex verbal description task targeting abstract word finding.  Pt recalled at least 5 word finding strategies addressed in previous therapy sessions with mod I.  During the abovementioned task, pt was 100% accurate independently for word finding of basic concepts, intermittent supervision needed for abstract concepts.  Pt with appropriate awareness into task difficulty.  Increased difficulty with word finding noted with multitasking (ambulating while talking).  Pt making good progress towards long term goals.  Pt left in chair with husband at bedside.  Continue per current plan of care.    Function:  Eating Eating                 Cognition Comprehension Comprehension assist level: Follows complex conversation/direction with no assist  Expression   Expression assist level: Expresses complex 90% of the time/cues < 10% of the time  Social Interaction Social Interaction assist level: Interacts appropriately with others - No medications needed.  Problem Solving Problem solving assist level: Solves complex problems: With extra time  Memory Memory assist level: More than reasonable amount of time    Pain Pain Assessment Pain Assessment: No/denies pain   Therapy/Group: Individual Therapy  Adrienne [name], Adrienne [name] 02/02/2016, 12:22 PM

## 2016-02-02 NOTE — Progress Notes (Signed)
Physical Therapy Session Note  Patient Details  Name: Adrienne [name] MRN: [id number] Date of Birth: 1928-04-12  Today's Date: 02/02/2016 PT Individual Time: 1300-1400 PT Individual Time Calculation (min): 60 min   Short Term Goals: Week 1:  PT Short Term Goal 1 (Week 1): =LTGs due to ELOS  Skilled Therapeutic Interventions/Progress Updates:    Pt received seated in recliner, c/o back pain and agreeable to treatment. Gait to gym with min guard faded to S using SPC. Slow speed and pt notes "not super comfortable with the cane yet but it's getting better". Standing alternating toes taps to 2", 4" and 6" step 2x10 on each height with min guard; increased unsteadiness with increased step height. Alternating step-ups to 6" steps in parallel bars with BUE support faded to 1 UE for RLE lead ascent, and no UE support for LLE lead ascent; min guard to minA for dynamic balance. Rocker board oriented medial/lateral and anterior/posterior, and upside down bosu 2x1 min each. Upon leaving the gym, pt vomited in nearest receptacle. Reports nausea came on suddenly, and relieved once resting several minutes with cool washcloth. Returned to room totalA and returned to chair in room with stand pivot min guard. Remained seated in chair with husband present and all needs within reach. RN alerted to pt vomiting.  Therapy Documentation Precautions:  Precautions Precautions: Fall Precaution Comments: pt is unsteady on her feet, family reports this was the case PTA, but more now Restrictions Weight Bearing Restrictions: No Pain: Pain Assessment Pain Assessment: 0-10 Pain Score: 3  Faces Pain Scale: Hurts a little bit Pain Type: Acute pain Pain Location: Back Pain Orientation: Mid Pain Descriptors / Indicators: Aching Pain Onset: On-going Patients Stated Pain Goal: 2 Pain Intervention(s): Repositioned;Ambulation/increased activity Multiple Pain Sites: No   See Function Navigator for Current Functional  Status.   Therapy/Group: Individual Therapy  Luberta Mutter 02/02/2016, 2:39 PM

## 2016-02-02 NOTE — Progress Notes (Signed)
Subjective/Complaints: Left rib pain persists not relieved by Tylenol some aggravation with physical therapy. Does not recall any falls or trauma to that area.   ROSneg  nausea, vomiting, diarrhea, constipation,   Objective: Vital Signs: Blood pressure 140/67, pulse 72, temperature 97.9 F (36.6 C), temperature source Oral, resp. rate 17, last menstrual period 09/12/1973, SpO2 98 %. No results found. Results for orders placed or performed during the hospital encounter of 01/29/16 (from the past 72 hour(s))  CBC WITH DIFFERENTIAL     Status: Abnormal   Collection Time: 02/01/16  6:15 AM  Result Value Ref Range   WBC 9.3 4.0 - 10.5 K/uL   RBC 3.15 (L) 3.87 - 5.11 MIL/uL   Hemoglobin 9.1 (L) 12.0 - 15.0 g/dL   HCT 28.4 (L) 36.0 - 46.0 %   MCV 90.2 78.0 - 100.0 fL   MCH 28.9 26.0 - 34.0 pg   MCHC 32.0 30.0 - 36.0 g/dL   RDW 14.2 11.5 - 15.5 %   Platelets 302 150 - 400 K/uL   Neutrophils Relative % 65 %   Neutro Abs 6.1 1.7 - 7.7 K/uL   Lymphocytes Relative 16 %   Lymphs Abs 1.5 0.7 - 4.0 K/uL   Monocytes Relative 17 %   Monocytes Absolute 1.6 (H) 0.1 - 1.0 K/uL   Eosinophils Relative 2 %   Eosinophils Absolute 0.2 0.0 - 0.7 K/uL   Basophils Relative 0 %   Basophils Absolute 0.0 0.0 - 0.1 K/uL  Comprehensive metabolic panel     Status: Abnormal   Collection Time: 02/01/16  6:15 AM  Result Value Ref Range   Sodium 137 135 - 145 mmol/L   Potassium 4.0 3.5 - 5.1 mmol/L   Chloride 103 101 - 111 mmol/L   CO2 27 22 - 32 mmol/L   Glucose, Bld 98 65 - 99 mg/dL   BUN 10 6 - 20 mg/dL   Creatinine, Ser 0.71 0.44 - 1.00 mg/dL   Calcium 8.7 (L) 8.9 - 10.3 mg/dL   Total Protein 5.2 (L) 6.5 - 8.1 g/dL   Albumin 2.5 (L) 3.5 - 5.0 g/dL   AST 12 (L) 15 - 41 U/L   ALT 12 (L) 14 - 54 U/L   Alkaline Phosphatase 57 38 - 126 U/L   Total Bilirubin 0.6 0.3 - 1.2 mg/dL   GFR calc non Af Amer >60 >60 mL/min   GFR calc Af Amer >60 >60 mL/min    Comment: (NOTE) The eGFR has been calculated  using the CKD EPI equation. This calculation has not been validated in all clinical situations. eGFR's persistently <60 mL/min signify possible Chronic Kidney Disease.    Anion gap 7 5 - 15     HEENT: normal Cardio: RRR and no murmurs Resp: CTA B/L and uunlabored GI: BS positive and nontender, nondistended Extremity:  No Edema Skin:   Intact Neuro: Alert/Oriented, Normal Sensory, Normal Motor and Other no evidence of aphasia Musc/Skel:  Other tenderness over the left lower ribs Gen. no acute distress   Assessment/Plan: 1. Functional deficits secondary to left MCA infarct which require 3+ hours per day of interdisciplinary therapy in a comprehensive inpatient rehab setting. Physiatrist is providing close team supervision and 24 hour management of active medical problems listed below. Physiatrist and rehab team continue to assess barriers to discharge/monitor patient progress toward functional and medical goals. FIM: Function - Bathing Position: Shower Body parts bathed by patient: Right arm, Left arm, Chest, Abdomen, Front perineal area, Buttocks, Right upper  leg, Left upper leg, Back Body parts bathed by helper: Left lower leg, Right lower leg Assist Level: Touching or steadying assistance(Pt > 75%)  Function- Upper Body Dressing/Undressing What is the patient wearing?: Bra, Pull over shirt/dress Bra - Perfomed by patient: Thread/unthread right bra strap, Thread/unthread left bra strap, Hook/unhook bra (pull down sports bra) Pull over shirt/dress - Perfomed by patient: Thread/unthread right sleeve, Thread/unthread left sleeve, Put head through opening, Pull shirt over trunk Assist Level: Supervision or verbal cues Function - Lower Body Dressing/Undressing What is the patient wearing?: Underwear, Pants, Shoes, Ted Hose Position: Wheelchair/chair at [employer] - Performed by patient: Thread/unthread right underwear leg, Thread/unthread left underwear leg, Pull underwear  up/down Pants- Performed by patient: Thread/unthread right pants leg, Thread/unthread left pants leg, Pull pants up/down, Fasten/unfasten pants Non-skid slipper socks- Performed by patient: Don/doff right sock, Don/doff left sock (pt doffed) Non-skid slipper socks- Performed by helper: Don/doff right sock, Don/doff left sock (OT donned) Shoes - Performed by patient: Don/doff right shoe, Don/doff left shoe TED Hose - Performed by helper: Don/doff right TED hose, Don/doff left TED hose Assist for footwear: Partial/moderate assist Assist for lower body dressing: Touching or steadying assistance (Pt > 75%)  Function - Toileting Toileting steps completed by patient: Adjust clothing prior to toileting, Performs perineal hygiene, Adjust clothing after toileting Toileting steps completed by helper: Adjust clothing prior to toileting Toileting Assistive Devices: Grab bar or rail Assist level: Touching or steadying assistance (Pt.75%)  Function - Air cabin crew transfer assistive device: Grab bar Assist level to toilet: Touching or steadying assistance (Pt > 75%) Assist level from toilet: Touching or steadying assistance (Pt > 75%)  Function - Chair/bed transfer Chair/bed transfer method: Ambulatory Chair/bed transfer assist level: Touching or steadying assistance (Pt > 75%) Chair/bed transfer assistive device: Armrests, Cane Chair/bed transfer details: Verbal cues for sequencing, Verbal cues for safe use of DME/AE  Function - Locomotion: Wheelchair Will patient use wheelchair at discharge?: No Function - Locomotion: Ambulation Assistive device: Cane-straight Max distance: 15 Assist level: Touching or steadying assistance (Pt > 75%) Assist level: Touching or steadying assistance (Pt > 75%) Assist level: Touching or steadying assistance (Pt > 75%) Assist level: Touching or steadying assistance (Pt > 75%) Assist level: Touching or steadying assistance (Pt > 75%)  Function -  Comprehension Comprehension: Auditory Comprehension assist level: Follows complex conversation/direction with no assist  Function - Expression Expression: Verbal Expression assist level: Expresses basic 90% of the time/requires cueing < 10% of the time.  Function - Social Interaction Social Interaction assist level: Interacts appropriately with others - No medications needed.  Function - Problem Solving Problem solving assist level: Solves complex problems: With extra time  Function - Memory Memory assist level: Assistive device: No helper Patient normally able to recall (first 3 days only): Current season, Location of own room, Staff names and faces, That he or she is in a hospital  Medical Problem List and Plan: 1. Right hemiparesis and gait/functional deficits secondary to left MCA infarct status post revascularization of occluded left MCA continue PT OT CIR level, team conference in the [place]. DVT Prophylaxis/Anticoagulation: Eliquis. Monitor hemoglobin, given history of GI bleed 3. Pain Management: Tylenol as needed, rib x-rays look unremarkable, report is pending, no irregularities noted on the iliac crest either 4. Hypertension/ Atrial fibrillation. Cardiac rate controlled. Cardizem 60 mg 3 times a day, Lopressor 100 mg twice a day. Monitor with increased mobility Filed Vitals:   02/02/16 0300 02/02/16 0514  BP: 150/69 140/67  Pulse: 72   Temp: 97.9 F (36.6 C)   Resp: 17    5. Neuropsych: This patient is capable of making decisions on her own behalf. 6. Skin/Wound Care: Routine skin checks 7. Fluids/Electrolytes/Nutrition: Routine I&O's follow-up chemistries 8. Recent GI bleed requiring transfusion. Follow-up gastroenterology as needed. Continue Protonix. Follow-up CBC, hemoglobin is down from 9.8-9.1,,  9. History of right breast cancer. Continue armidex.this can cause musculoskeletal pain 10. Hyperlipidemia. Lipitor 11. Hypoalbuminemia- start Pro stat LOS (Days)  4 A FACE TO FACE EVALUATION WAS PERFORMED  Adrienne [name] E 02/02/2016, 9:04 AM

## 2016-02-03 ENCOUNTER — Inpatient Hospital Stay (HOSPITAL_COMMUNITY): Payer: Medicare Other | Admitting: Speech Pathology

## 2016-02-03 ENCOUNTER — Inpatient Hospital Stay (HOSPITAL_COMMUNITY): Payer: Medicare Other

## 2016-02-03 ENCOUNTER — Inpatient Hospital Stay (HOSPITAL_COMMUNITY): Payer: Medicare Other | Admitting: Occupational Therapy

## 2016-02-03 LAB — URINALYSIS, ROUTINE W REFLEX MICROSCOPIC
BILIRUBIN URINE: NEGATIVE
Glucose, UA: NEGATIVE mg/dL
KETONES UR: NEGATIVE mg/dL
NITRITE: NEGATIVE
PH: 6 (ref 5.0–8.0)
Protein, ur: NEGATIVE mg/dL
Specific Gravity, Urine: 1.013 (ref 1.005–1.030)

## 2016-02-03 LAB — URINE MICROSCOPIC-ADD ON

## 2016-02-03 LAB — CBC
HEMATOCRIT: 28 % — AB (ref 36.0–46.0)
HEMOGLOBIN: 8.9 g/dL — AB (ref 12.0–15.0)
MCH: 28.7 pg (ref 26.0–34.0)
MCHC: 31.8 g/dL (ref 30.0–36.0)
MCV: 90.3 fL (ref 78.0–100.0)
Platelets: 349 10*3/uL (ref 150–400)
RBC: 3.1 MIL/uL — AB (ref 3.87–5.11)
RDW: 14 % (ref 11.5–15.5)
WBC: 6.8 10*3/uL (ref 4.0–10.5)

## 2016-02-03 NOTE — Progress Notes (Signed)
Physical Therapy Note  Patient Details  Name: Adrienne [name] MRN: [id number] Date of Birth: 1928-06-05 Today's Date: 02/03/2016  1100-1140, 40 min 1345-1430, 45 min  Pain:2/10 L thoracic; premedicated am; 0/10 PM  tx 1:  Pt and husband asked many questions about pain meds OTC as well as RX.  PT recommended that they always check with PA or MD before trying any different pain meds.  Family ed with husband and dtr for gait sequencing with SPC, and [place] exs.  Pt performed standing : R/L hip abduction, mini squats, R/L hamstring curls, bil heel raises/toe raises, 10 x 1 eacgm 0# wts, with 2 seated rest breaks.  Hand out provided.   Gait training with SPC with pt; she has difficulty with fluid gait while using SPC; gait much more fluid with HHA.  Pt has L knee slight instability due to TKA.    tx 2:  Gait with SPC x 250' with improved sequencing without cueing, improved fluidity.  Pt reported she was able to figure out sequencing in her brain for 2 pt gait. Further family ed for simulated car transfer to sedan ht seat.  Family and pt would like to have transport chair for longer distances in community. Up/down 4" high step with SPC with PT and with husband, min guard assist, to simulate home entry from garage. Family ed for fall recovery and discussion of entry into dtr's house. Gait to return to room with husband on R; as someone passed in front of pt, she had LOB L requiring mod assist to recover. Husband realized how quickly this happened and did react but not enough.  Discussed at length with husband and dtr, and need for attention at all times for now.  They stated they understand.  Leory Allinson 02/03/2016, 8:00 AM

## 2016-02-03 NOTE — Progress Notes (Signed)
Subjective/Complaints: Left rib pain persists not relieved by Tylenol some aggravation with physical therapy. Does not recall any falls or trauma to that area. Some blood noted inbrief period history of hematuria which was previously treated with an antibiotic through her urology office. She does not have any burning with urination no fevers.  ROSneg  nausea, vomiting, diarrhea, constipation,   Objective: Vital Signs: Blood pressure 118/56, pulse 74, temperature 97.6 F (36.4 C), temperature source Oral, resp. rate 18, last menstrual period 09/12/1973, SpO2 98 %. Dg Ribs Unilateral Left  02/02/2016  CLINICAL DATA:  Left posterior rib pain, status post fall. EXAM: LEFT RIBS - 2 VIEW COMPARISON:  None. FINDINGS: No displaced fracture or other bone lesions are seen involving the visualized ribs. No pulmonary mass or infiltrate noted. IMPRESSION: No evidence of displaced rib fractures. Electronically Signed   By: Fidela Salisbury M.D.   On: 02/02/2016 09:08   Results for orders placed or performed during the hospital encounter of 01/29/16 (from the past 72 hour(s))  CBC WITH DIFFERENTIAL     Status: Abnormal   Collection Time: 02/01/16  6:15 AM  Result Value Ref Range   WBC 9.3 4.0 - 10.5 K/uL   RBC 3.15 (L) 3.87 - 5.11 MIL/uL   Hemoglobin 9.1 (L) 12.0 - 15.0 g/dL   HCT 28.4 (L) 36.0 - 46.0 %   MCV 90.2 78.0 - 100.0 fL   MCH 28.9 26.0 - 34.0 pg   MCHC 32.0 30.0 - 36.0 g/dL   RDW 14.2 11.5 - 15.5 %   Platelets 302 150 - 400 K/uL   Neutrophils Relative % 65 %   Neutro Abs 6.1 1.7 - 7.7 K/uL   Lymphocytes Relative 16 %   Lymphs Abs 1.5 0.7 - 4.0 K/uL   Monocytes Relative 17 %   Monocytes Absolute 1.6 (H) 0.1 - 1.0 K/uL   Eosinophils Relative 2 %   Eosinophils Absolute 0.2 0.0 - 0.7 K/uL   Basophils Relative 0 %   Basophils Absolute 0.0 0.0 - 0.1 K/uL  Comprehensive metabolic panel     Status: Abnormal   Collection Time: 02/01/16  6:15 AM  Result Value Ref Range   Sodium 137 135 -  145 mmol/L   Potassium 4.0 3.5 - 5.1 mmol/L   Chloride 103 101 - 111 mmol/L   CO2 27 22 - 32 mmol/L   Glucose, Bld 98 65 - 99 mg/dL   BUN 10 6 - 20 mg/dL   Creatinine, Ser 0.71 0.44 - 1.00 mg/dL   Calcium 8.7 (L) 8.9 - 10.3 mg/dL   Total Protein 5.2 (L) 6.5 - 8.1 g/dL   Albumin 2.5 (L) 3.5 - 5.0 g/dL   AST 12 (L) 15 - 41 U/L   ALT 12 (L) 14 - 54 U/L   Alkaline Phosphatase 57 38 - 126 U/L   Total Bilirubin 0.6 0.3 - 1.2 mg/dL   GFR calc non Af Amer >60 >60 mL/min   GFR calc Af Amer >60 >60 mL/min    Comment: (NOTE) The eGFR has been calculated using the CKD EPI equation. This calculation has not been validated in all clinical situations. eGFR's persistently <60 mL/min signify possible Chronic Kidney Disease.    Anion gap 7 5 - 15  CBC     Status: Abnormal   Collection Time: 02/03/16  7:04 AM  Result Value Ref Range   WBC 6.8 4.0 - 10.5 K/uL   RBC 3.10 (L) 3.87 - 5.11 MIL/uL   Hemoglobin 8.9 (L)  12.0 - 15.0 g/dL   HCT 28.0 (L) 36.0 - 46.0 %   MCV 90.3 78.0 - 100.0 fL   MCH 28.7 26.0 - 34.0 pg   MCHC 31.8 30.0 - 36.0 g/dL   RDW 14.0 11.5 - 15.5 %   Platelets 349 150 - 400 K/uL     HEENT: normal Cardio: RRR and no murmurs Resp: CTA B/L and uunlabored GI: BS positive and nontender, nondistended Extremity:  No Edema Skin:   Intact Neuro: Alert/Oriented, Normal Sensory, Normal Motor and Other no evidence of aphasia Musc/Skel:  Other tenderness over the left lower ribs Gen. no acute distress   Assessment/Plan: 1. Functional deficits secondary to left MCA infarct which require 3+ hours per day of interdisciplinary therapy in a comprehensive inpatient rehab setting. Physiatrist is providing close team supervision and 24 hour management of active medical problems listed below. Physiatrist and rehab team continue to assess barriers to discharge/monitor patient progress toward functional and medical goals. FIM: Function - Bathing Position: Shower Body parts bathed by  patient: Right arm, Left arm, Chest, Abdomen, Front perineal area, Buttocks, Right upper leg, Left upper leg, Back, Right lower leg, Left lower leg Body parts bathed by helper: Left lower leg, Right lower leg Assist Level: More than reasonable time  Function- Upper Body Dressing/Undressing What is the patient wearing?: Bra, Pull over shirt/dress Bra - Perfomed by patient: Thread/unthread right bra strap, Thread/unthread left bra strap, Hook/unhook bra (pull down sports bra) Pull over shirt/dress - Perfomed by patient: Thread/unthread right sleeve, Thread/unthread left sleeve, Put head through opening, Pull shirt over trunk Assist Level: More than reasonable time Function - Lower Body Dressing/Undressing What is the patient wearing?: Underwear, Pants, Shoes, Ted Hose Position: Wheelchair/chair at [employer] - Performed by patient: Thread/unthread right underwear leg, Thread/unthread left underwear leg, Pull underwear up/down Pants- Performed by patient: Thread/unthread right pants leg, Thread/unthread left pants leg, Pull pants up/down, Fasten/unfasten pants Non-skid slipper socks- Performed by patient: Don/doff right sock, Don/doff left sock Non-skid slipper socks- Performed by helper: Don/doff right sock, Don/doff left sock (OT donned) Shoes - Performed by patient: Don/doff right shoe, Don/doff left shoe TED Hose - Performed by helper: Don/doff right TED hose, Don/doff left TED hose Assist for footwear: Independent Assist for lower body dressing: More than reasonable time  Function - Toileting Toileting steps completed by patient: Adjust clothing prior to toileting, Performs perineal hygiene, Adjust clothing after toileting Toileting steps completed by helper: Adjust clothing prior to toileting Toileting Assistive Devices: Grab bar or rail Assist level: More than reasonable time  Function - Air cabin crew transfer assistive device: Grab bar, Cane Assist level to toilet: No  Help, no cues, assistive device, takes more than a reasonable amount of time Assist level from toilet: No Help, no cues, assistive device, takes more than a reasonable amount of time  Function - Chair/bed transfer Chair/bed transfer method: Ambulatory Chair/bed transfer assist level: Touching or steadying assistance (Pt > 75%) Chair/bed transfer assistive device: Armrests, Cane Chair/bed transfer details: Verbal cues for sequencing, Verbal cues for safe use of DME/AE  Function - Locomotion: Wheelchair Will patient use wheelchair at discharge?: No Function - Locomotion: Ambulation Assistive device: Cane-straight Max distance: 150 Assist level: Touching or steadying assistance (Pt > 75%) Assist level: Touching or steadying assistance (Pt > 75%) Assist level: Touching or steadying assistance (Pt > 75%) Assist level: Touching or steadying assistance (Pt > 75%) Assist level: Touching or steadying assistance (Pt > 75%)  Function - Comprehension Comprehension:  Auditory Comprehension assist level: Follows complex conversation/direction with no assist  Function - Expression Expression: Verbal Expression assist level: Expresses complex ideas: With extra time/assistive device  Function - Social Interaction Social Interaction assist level: Interacts appropriately with others - No medications needed.  Function - Problem Solving Problem solving assist level: Solves complex problems: With extra time  Function - Memory Memory assist level: Complete Independence: No helper Patient normally able to recall (first 3 days only): Current season, Location of own room, Staff names and faces, That he or she is in a hospital  Medical Problem List and Plan: 1. Right hemiparesis and gait/functional deficits secondary to left MCA infarct status post revascularization of occluded left MCA continue PT OT CIR level, team conference in the [place]. DVT Prophylaxis/Anticoagulation: Eliquis. Monitor  hemoglobin, given history of GI bleed 3. Pain Management: No improvement withTylenol, but is getting some relief with Ultracet, rib x-rays look unremarkable, report is pending, no irregularities noted on the iliac crest either 4. Hypertension/ Atrial fibrillation. Cardiac rate controlled. Cardizem 60 mg 3 times a day, Lopressor 100 mg twice a day. Monitor with increased mobility Filed Vitals:   02/03/16 0509 02/03/16 0525  BP: 141/48 118/56  Pulse: 60 74  Temp: 98.4 F (36.9 C) 97.6 F (36.4 C)  Resp: 18 18   5. Neuropsych: This patient is capable of making decisions on her own behalf. 6. Skin/Wound Care: Routine skin checks 7. Fluids/Electrolytes/Nutrition: Routine I&O's follow-up chemistries 8. Recent GI bleed requiring transfusion. Follow-up gastroenterology as needed. Continue Protonix. Follow-up CBC, hemoglobin is down from 9.8 to 8.9, some vaginal bleeding noted, will check UA since she has a hx of what sounds like hemmorhagic cystitis 9. History of right breast cancer. Continue armidex.this can cause musculoskeletal pain 10. Hyperlipidemia. Lipitor 11. Hypoalbuminemia- start Pro stat LOS (Days) 5 A FACE TO FACE EVALUATION WAS PERFORMED  KIRSTEINS,ANDREW E 02/03/2016, 11:39 AM

## 2016-02-03 NOTE — Progress Notes (Signed)
Occupational Therapy Session Note  Patient Details  Name: Adrienne [name] MRN: [id number] Date of Birth: 10-23-1927  Today's Date: 02/03/2016 OT Individual Time: 0800-0900 OT Individual Time Calculation (min): 60 min    Short Term Goals: Week 1:  OT Short Term Goal 1 (Week 1): STG= LTG  Skilled Therapeutic Interventions/Progress Updates:    1:1 self care retraining at shower level. Pt ambulated around room with cane at slow pace mod I (without cues).  Pt retrieved clothing from closet but required A to take down hanger with 2 pairs of pants due to too heavy for her to lift the hanger off the rack requiring some A. Pt performed toileting mod I but did have some bleeding from her vagina this am- reported it started yesterday. Alerted RN and plan to f/u with MD in conference. Pt bathed at shower level mod I with extra time. Pt dressed mod I sit to stand sitting on couch. Pt with increased SOb after b/d and requested to sit to fix hair.   Therapy Documentation Precautions:  Precautions Precautions: Fall Precaution Comments: pt is unsteady on her feet, family reports this was the case PTA, but more now Restrictions Weight Bearing Restrictions: No Pain: Pain Assessment Pain Score: 4  Pain Type: Acute pain Pain Location: Back Pain Orientation: Left;Lower Pain Descriptors / Indicators: Aching Pain Intervention(s): Medication (See eMAR)  See Function Navigator for Current Functional Status.   Therapy/Group: Individual Therapy  Willeen Cass [facility] 02/03/2016, 8:21 AM

## 2016-02-03 NOTE — Progress Notes (Signed)
Speech Language Pathology Daily Session Note  Patient Details  Name: Adrienne [name] MRN: [id number] Date of Birth: July 26, 1928  Today's Date: 02/03/2016 SLP Individual Time: 0900-1000 SLP Individual Time Calculation (min): 60 min  Short Term Goals: Week 1: SLP Short Term Goal 1 (Week 1): STG=LTG due to ELOS   Skilled Therapeutic Interventions:  Pt was seen for skilled ST targeting cognitive goals.  SLP facilitated the session with various drill tasks for naming including categorical naming, divergent naming, and generative naming for synonyms while pt was engaged in a familiar self care task.  Pt required intermittent supervision level verbal cues for word finding and awareness/correction of sematic paraphasias.  During functional conversations with therapist and family members, pt was mod I for word finding when not dual tasking.  Pt's family was present and educated regarding communication techniques to maximize pt's functional independence for word finding.  Handout provided to facilitate carryover in between therapy sessions and in the home environment.  All questions were answered to their satisfaction at this time.    Function:  Eating Eating                 Cognition Comprehension Comprehension assist level: Follows complex conversation/direction with no assist  Expression   Expression assist level: Expresses complex 90% of the time/cues < 10% of the time  Social Interaction Social Interaction assist level: Interacts appropriately with others - No medications needed.  Problem Solving Problem solving assist level: Solves complex problems: With extra time  Memory Memory assist level: Complete Independence: No helper    Pain Pain Assessment Pain Assessment: No/denies pain Pain Score: 0-No pain  Therapy/Group: Individual Therapy  Teriyah Purington, Selinda Orion 02/03/2016, 12:31 PM

## 2016-02-04 ENCOUNTER — Inpatient Hospital Stay (HOSPITAL_COMMUNITY): Payer: Medicare Other | Admitting: Speech Pathology

## 2016-02-04 ENCOUNTER — Inpatient Hospital Stay (HOSPITAL_COMMUNITY): Payer: Medicare Other | Admitting: Occupational Therapy

## 2016-02-04 ENCOUNTER — Inpatient Hospital Stay (HOSPITAL_COMMUNITY): Payer: Medicare Other

## 2016-02-04 DIAGNOSIS — N3091 Cystitis, unspecified with hematuria: Secondary | ICD-10-CM

## 2016-02-04 MED ORDER — CEPHALEXIN 250 MG PO CAPS
250.0000 mg | ORAL_CAPSULE | Freq: Three times a day (TID) | ORAL | Status: DC
  Administered 2016-02-04 – 2016-02-05 (×4): 250 mg via ORAL
  Filled 2016-02-04 (×4): qty 1

## 2016-02-04 NOTE — Progress Notes (Signed)
Occupational Therapy Discharge Summary  Patient Details  Name: Adrienne [name] MRN: [id number] Date of Birth: Dec 23, 1927  Today's Date: 02/04/2016 OT Individual Time: 0800-0900 OT Individual Time Calculation (min): 60 min   1:1 GRAD DAY! Self care retraining at shower level with focus on performing morning routine as close to her home routine at mod I level. Pt with improved ability to sequence [place] with functional ambulation today; however still continues to use cane and "furniture walking" in room / smaller spaces and at a very slow pace. Pt with improved activity tolerance and standing balance to stand during grooming tasks including donning makeup.  Patient has met 10 of 10 long term goals due to improved activity tolerance, improved balance, postural control, ability to compensate for deficits, functional use of  RIGHT upper and RIGHT lower extremity, improved attention, improved awareness and improved coordination.  Patient to discharge at overall Modified Independent level.  Patient's care partner is independent to provide the necessary physical and for IADL tasks assistance at discharge.    Reasons goals not met: n/a  Recommendation:  No further OT at this time.  Equipment: SPC  Reasons for discharge: treatment goals met and discharge from hospital  Patient/family agrees with progress made and goals achieved: Yes  OT Discharge Precautions/Restrictions  Precautions Precaution Comments: slow with mobility  Restrictions Weight Bearing Restrictions: No Pain Took pain meds before session (no reports of pain now) ADL ADL ADL Comments: see functional naviagor Vision/Perception  Vision- History Baseline Vision/History: Wears glasses Wears Glasses: Reading only Patient Visual Report: No change from baseline Vision- Assessment Vision Assessment?: No apparent visual deficits improved visual disturbances since time of stroke Cognition Overall Cognitive Status: Within Functional  Limits for tasks assessed Orientation Level: Oriented X4 Attention: Alternating Alternating Attention: Appears intact Memory: Appears intact Awareness: Appears intact Problem Solving: Appears intact Reasoning: Appears intact Sequencing: Appears intact Organizing: Appears intact Decision Making: Appears intact Initiating: Appears intact Self Monitoring: Appears intact Self Correcting: Appears intact Sensation Sensation Light Touch: Appears Intact Proprioception: Appears Intact Coordination Gross Motor Movements are Fluid and Coordinated: Yes Fine Motor Movements are Fluid and Coordinated: Yes Motor  Motor Motor - Discharge Observations: improved overall strength Mobility  Transfers Transfers: Sit to Stand;Stand to Sit Sit to Stand: 6: Modified independent (Device/Increase time) Stand to Sit: 6: Modified independent (Device/Increase time)  Trunk/Postural Assessment  Cervical Assessment Cervical Assessment:  (forward flexed) Thoracic Assessment Thoracic Assessment:  (kyphotic) Lumbar Assessment Lumbar Assessment: Exceptions to Comprehensive Outpatient Surge Lumbar AROM Overall Lumbar AROM: Due to premorid status Postural Control Postural Control: Within Functional Limits  Balance Dynamic Sitting Balance Sitting balance - Comments: mod I  Static Standing Balance Static Standing - Level of Assistance: 6: Modified independent (Device/Increase time) Dynamic Standing Balance Dynamic Standing - Level of Assistance: 6: Modified independent (Device/Increase time) Extremity/Trunk Assessment  4+/5 for right and left UE-improved strength and coordination     See Function Navigator for Current Functional Status.  Willeen Cass [facility] 02/04/2016, 8:27 AM

## 2016-02-04 NOTE — Progress Notes (Signed)
Physical Therapy Discharge Summary  Patient Details  Name: Adrienne [name] MRN: [id number] Date of Birth: Jun 10, 1928  Today's Date: 02/04/2016 PT Individual Time: 0900-1000 PT Individual Time Calculation (min): 60 min   Patient has met 10 of 10 long term goals due to improved activity tolerance, improved balance, improved postural control, increased strength, decreased pain, ability to compensate for deficits, functional use of  right upper extremity, right lower extremity and left lower extremity, improved attention and improved awareness.  Patient to discharge at an ambulatory level Modified Independent in quiet environment, supervision in distracting environment.    Patient's care partner is independent to provide the necessary cognitive assistance at discharge.  Reasons goals not met: n/a  Recommendation:  Patient will benefit from ongoing skilled PT services in outpatient setting to continue to advance safe functional mobility, address ongoing impairments in bil LE weakness due to OA, balance, activity tolerance,  and minimize fall risk. Pt trained using SPC for ambulation during this CIR stay, until shortly before d/c.  Pt had a LOB in a busy hall with pt's husband guarding her, but PT was required for pt to regain balance, as husband did not react fast enough.  This PT with family/pt input decided that RW is safer at this point.  Pt agreed and felt much safer with it.     Equipment: RW; pt owns [facility] which she will transition to, with further PT  Reasons for discharge: treatment goals met and discharge from hospital  Patient/family agrees with progress made and goals achieved: Yes  PT Discharge  tx today: gait training with RW, family ed for use of RW.  Patient demonstrates increased fall risk as noted by score of  35 /56 on Berg Balance Scale.  (<36= high risk for falls, close to 100%; 37-45 significant >80%; 46-51 moderate >50%; 52-55 lower >25%)   PT discussed falls risk and lack of  progress on Berg assessment, partly due to pt's bil LE weakness and balance limitations prior to CVA.  PT also discussed life-long fitness and recommendation that pt pursue exercise in a supervised setting after OPPT is completed, with approval of MD.  PT consulted with OP, and pt will be modified independent in room with RW today. Precautions/Restrictions Precautions Precautions: Fall Precaution Comments: long standing weakness bil hips due to OA/ bil TKA Restrictions Weight Bearing Restrictions: No  Pain Pain Assessment Faces Pain Scale: Hurts a little bit Pain Type: Acute pain Pain Location: Hip (bil) Pain Orientation: Left Pain Descriptors / Indicators: Discomfort Pain Intervention(s): Medication (See eMAR) Vision/Perception no change from baseline    Cognition Overall Cognitive Status: Within Functional Limits for tasks assessed Arousal/Alertness: Awake/alert Orientation Level: Oriented X4 Attention: Alternating Alternating Attention: Appears intact Memory: Appears intact Awareness: Appears intact Problem Solving: Appears intact Reasoning: Appears intact Sequencing: Appears intact Organizing: Appears intact Decision Making: Appears intact Initiating: Appears intact Self Monitoring: Appears intact Self Correcting: Appears intact Sensation Sensation Light Touch: Appears Intact Proprioception: Appears Intact Coordination Gross Motor Movements are Fluid and Coordinated: Yes Fine Motor Movements are Fluid and Coordinated: Yes Heel Shin Test:  limited excursion bil Motor  Motor Motor: Within Functional Limits Motor - Discharge Observations: improved overall strength  Mobility Bed Mobility Bed Mobility:  (modified independent for all) Transfers Transfers: Yes Sit to Stand: 6: Modified independent (Device/Increase time) Stand to Sit: 6: Modified independent (Device/Increase time) Stand Pivot Transfers: 6: Modified independent (Device/Increase time) Locomotion   Ambulation Ambulation: Yes Ambulation/Gait Assistance: 5: Supervision Ambulation Distance (Feet): 250 Feet  Assistive device: Rolling walker Gait Gait: Yes Gait Pattern: Impaired Gait Pattern: Step-to pattern;Trunk flexed Gait velocity: 2.73'/secMWT High Level Ambulation High Level Ambulation: Side stepping;Backwards walking;Direction changes Side Stepping: S wiht AD Backwards Walking: S with AD Direction Changes: jS with AD Stairs / Additional Locomotion Stairs: Yes Stairs Assistance: 5: Supervision Stair Management Technique: Two rails Number of Stairs: 12 Height of Stairs: 6 (and 3) Ramp: 5: Supervision Curb: 5: Supervision Wheelchair Mobility Wheelchair Mobility: No  Trunk/Postural Assessment  Cervical Assessment Cervical Assessment:  (forward flexed) Thoracic Assessment Thoracic Assessment:  (kyphotic) Lumbar Assessment Lumbar Assessment: Exceptions to [facility] Lumbar AROM Overall Lumbar AROM: Due to premorimd status Postural Control Postural Control: Within Functional Limits  Balance Balance Balance Assessed: Yes  With external perturbations, pt demonstrates bil ankle strategy and slow stepping strategy, but no hip strategy  Standardized Balance Assessment Standardized Balance Assessment: Berg Balance Test Berg Balance Test Sit to Stand: Able to stand  independently using hands Standing Unsupported: Able to stand 2 minutes with supervision Sitting with Back Unsupported but Feet Supported on Floor or Stool: Able to sit safely and securely 2 minutes Stand to Sit: Controls descent by using hands Transfers: Able to transfer safely, minor use of hands Standing Unsupported with Eyes Closed: Able to stand 10 seconds with supervision Standing Ubsupported with Feet Together: Able to place feet together independently and stand for 1 minute with supervision From Standing, Reach Forward with Outstretched Arm: Can reach forward >5 cm safely (2") From Standing Position, Pick  up Object from Floor: Able to pick up shoe, needs supervision From Standing Position, Turn to Look Behind Over each Shoulder: Turn sideways only but maintains balance Turn 360 Degrees: Able to turn 360 degrees safely but slowly Standing Unsupported, Alternately Place Feet on Step/Stool: Able to complete >2 steps/needs minimal assist Standing Unsupported, One Foot in Front: Able to take small step independently and hold 30 seconds Standing on One Leg: Unable to try or needs assist to prevent fall Total Score: 35 Dynamic Sitting Balance Sitting balance - Comments: mod I  Static Standing Balance Static Standing - Level of Assistance: 6: Modified independent (Device/Increase time) Dynamic Standing Balance Dynamic Standing - Level of Assistance: 6: Modified independent (Device/Increase time) Extremity Assessment      RLE Assessment RLE Assessment: Exceptions to [facility] RLE AROM (degrees) RLE Overall AROM Comments: WFL assessed in sitting, R foot pain on dorsum, resolved RLE Strength Right Hip Flexion: 4+/5 Right Knee Flexion: 4/5 Right Knee Extension: 4+/5 Right Ankle Dorsiflexion: 4/5 Right Ankle Plantar Flexion: 4/5 LLE Assessment LLE Assessment: Exceptions to WFL LLE AROM (degrees) LLE Overall AROM Comments: WFL assessed in sitting LLE Strength Left Hip Flexion: 4-/5 Left Knee Flexion: 4/5 Left Knee Extension: 4+/5 Left Ankle Dorsiflexion: 5/5 Left Ankle Plantar Flexion: 5/5   See Function Navigator for Current Functional Status.  Rayven Rettig 02/04/2016, 11:26 AM

## 2016-02-04 NOTE — Discharge Summary (Signed)
NAMEMYLASIA, HEDMAN NO.:  [id number]  MEDICAL RECORD NO.:  [id number]  LOCATION:  [id number]                        FACILITY:  [facility]  PHYSICIAN:  [name], M.D.DATE OF BIRTH:  Jun 10, 1928  DATE OF ADMISSION:  01/29/2016 DATE OF DISCHARGE:  02/05/2016                              DISCHARGE SUMMARY   DISCHARGE DIAGNOSES: 1. Left middle cerebral artery infarct status post revascularization     of occluded left middle cerebral artery. 2. Pain management. 3. Hypertension. 4. History of gastrointestinal bleed, requiring transfusion. 5. Hyperlipidemia. 6. Hemorrhagic Cystitis HISTORY OF PRESENT ILLNESS:  This is an 82 year old right-handed female with history of right breast cancer, atrial fibrillation on chronic Coumadin.  Lives with spouse.  The patient relocated recently from [place], [place].  Recently admitted on January 07, 2016, with intermittent bloody stools, upper abdominal pain.  CT showed distal diverticulosis, otherwise unrevealing.  MRI of the spine showed a T12 compression fracture, felt to be old.  INR on admission 2.5, receive vitamin K.  Hemoglobin 13.9, dropped to 9.0.  Underwent upper endoscopy per Gastroenterology that showed small hiatal hernia, a medium non bleeding diverticulum found in the area of the papilla.  The patient's anticoagulation remained on hold.  She was discharged to home on January 10, 2016, plan to follow up outpatient with GI.  Presented on Jan 25, 2016, with right-sided weakness.  Found to be down in the bathroom by her husband.  Her Coumadin had recently been restarted 1 week prior. INR on admission 1.5.  CT showed atrophy, small vessel disease in the periventricular white matter.  Evidence of prior infarct in the lateral left lentiform nucleus.  CT angiogram of the head and neck positive for emergent large vessel occlusion at the left MCA bifurcation, underwent revascularization of occluded left MCA per  Interventional Radiology. Remained for a short time on ventilator.  MRI showed left MCA territory infarct.  Echocardiogram with ejection fraction of 65%.  No wall motion abnormalities.  Neurology followup, transitioned to Eliquis on Jan 28, 2016, no plan to resume Coumadin.  Hemoglobin and hematocrit remained stable.  The patient was admitted for a comprehensive rehab program.  PAST MEDICAL HISTORY:  See discharge diagnoses.  SOCIAL HISTORY:  Lives with spouse, recently relocated from [place] to [place].  Functional status upon admission to rehab services was minimal assist to 100 feet one-person handheld assistance, minimal guard sit to stand, min to mod assist for activities of daily living.  PHYSICAL EXAMINATION:  VITAL SIGNS:  Blood pressure 155/75, pulse 75, temperature 98, respirations 20. GENERAL:  This was an alert female, oriented to person, place, and time. LUNGS:  Clear to auscultation without wheeze. CARDIAC:  Regular rate and rhythm. ABDOMEN:  Soft, nontender.  Good bowel sounds.  REHABILITATION HOSPITAL COURSE:  The patient was admitted to inpatient rehab services with therapies initiated on a 3-hour daily basis consisting of physical therapy, occupational therapy, speech therapy, and rehabilitation nursing.  The following issues were addressed during the patient's rehabilitation stay.  Pertaining to Ms. Seifert's left MCA infarct she had undergone revascularization of occluded left MCA, she would follow up with Neurology Services.  Eliquis had been initiated for CVA prophylaxis.  Monitoring  for any bleeding episodes.  She would remain off her prior Coumadin therapy.  Blood pressures controlled and monitored.  No chest pain or shortness of breath.  She is maintained on a regular diet.  Hemoglobin and hematocrit remained stable.  She would follow up with her primary MD.  She did have a history of right breast cancer, remained on Arimidex as advised.  Lipitor  ongoing for hyperlipidemia. Hemorrhagic cystitis placed on Keflex 3 times a day 02/04/2016 7 days. Patient would resume trimethoprim after Keflex completed.  The patient received weekly collaborative interdisciplinary team conferences to discuss estimated length of stay, family teaching, and any barriers to discharge.  She was ambulating 250 feet straight point cane.  Improved sequencing without cuing.  She was able to figure out a room sequencing.  Modified independent car transfers, up and down stairs with a straight point cane and minimal guard assist.  She could gather her belongings for activities of daily living and homemaking.  Retrieve clothing from closet required minimal assist to take down from the hangers.  Reviewed compensatory strategies as needed for any difficulty with word finding.  She was able to communicate her needs without any difficulties.  Full family teaching was completed.  Plan discharged to home with ongoing therapies dictated per [facility].  DISCHARGE MEDICATIONS: 1. Arimidex 1 mg p.o. daily. 2. Eliquis 5 mg p.o. b.i.d. 3. Lipitor 20 mg p.o. daily. 4. Cardizem 60 mg p.o. every 8 hours. 5. Lopressor 100 mg p.o. b.i.d. 6. Protonix 40 mg p.o. daily. 7. Ultracet 37.5-325 mg 1 tab every 6 hours as needed moderate pain,     dispense of 60 tablets. 8. Keflex 250 mg 3 times a day 6 days. Resume trimethoprim after Keflex completed DIET:  Regular.  FOLLOWUP:  She would follow up with Dr. Alysia Penna at the outpatient rehab service office as directed; Dr. Erlinda Hong, Neurology Services; Dr. Kathyrn Lass, medical management; Dr. Arta Silence, Gastroenterology Services as needed.     Lauraine Rinne, P.A.   ______________________________ [name], M.D.    DA/MEDQ  D:  02/04/2016  T:  02/04/2016  Job:  [id number]  cc:   Kathyrn Lass, M.D.

## 2016-02-04 NOTE — Progress Notes (Addendum)
Speech Language Pathology Discharge Summary  Patient Details  Name: Adrienne [name] MRN: [id number] Date of Birth: [birth year]  Today's Date: 02/04/2016 SLP Individual Time: 1405-1430 SLP Individual Time Calculation (min): 25 min   Skilled Therapeutic Interventions:  Pt was seen for skilled ST targeting communication goals.  Pt was independent for word finding during functional conversations with SLP.  Pt reports being very pleased with the way her speech has progressed and has no worries about being able to convey her needs/wants to caregivers and other communication partners.  All education is complete and all questions were answered to pt's satisfaction at this time.  Pt was left in recliner with husband at bedside.  Continue per current plan of care.      Patient has met 1 of 1 long term goals.  Patient to discharge at overall Independent level.  Reasons goals not met: n/a   Clinical Impression/Discharge Summary:  Pt made excellent gains while inpatient and is discharging at an independent level for word finding of abstract concepts and ideas.  All pt and family education is complete at this time.  No further ST needs are indicated given that pt is independently managing intermittent word finding difficulty.    Care Partner:  Caregiver Able to Provide Assistance: Yes  Type of Caregiver Assistance:  (n/a)  Recommendation:  None      Equipment: none recommended by SLP    Reasons for discharge: Discharged from hospital   Patient/Family Agrees with Progress Made and Goals Achieved: Yes   Function:  Eating Eating     Eating Assist Level: No help, No cues           Cognition Comprehension Comprehension assist level: Follows complex conversation/direction with no assist  Expression   Expression assist level: Expresses complex ideas: With no assist  Social Interaction Social Interaction assist level: Interacts appropriately with others with medication or extra time  (anti-anxiety, antidepressant).  Problem Solving Problem solving assist level: Solves complex problems: With extra time  Memory Memory assist level: More than reasonable amount of time   Emilio Math 02/04/2016, 4:38 PM

## 2016-02-04 NOTE — Progress Notes (Signed)
Occupational Therapy Session Note  Patient Details  Name: Adrienne [name] MRN: [id number] Date of Birth: 1928/08/29  Today's Date: 02/04/2016 OT Individual Time: 1300-1345 OT Individual Time Calculation (min): 45 min    Skilled Therapeutic Interventions/Progress Updates:    Pt ambulated with modified independence down to the ADL apartment using her RW.  Pt's spouse also accompanied her where focus of session was on simple homemanagement tasks with use of the RW.  Educated pt on use of the RW for making up the bed and for simple kitchen activities.  She was able to return demonstrate safe performance of both of these tasks with the RW.  Occasional min instructional cueing needed for reminder to reach back for the surface she is sitting on instead of holding onto the walker.  Provided walker bag for use at home as well.    Therapy Documentation Precautions:  Precautions Precautions: Fall Precaution Comments: long standing weakness bil hips due to OA/ bil TKA Restrictions Weight Bearing Restrictions: No  Pain: Pain Assessment Pain Assessment: No/denies pain ADL: See Function Navigator for Current Functional Status.   Therapy/Group: Individual Therapy  Elzie Knisley OTR/L 02/04/2016, 4:20 PM

## 2016-02-04 NOTE — Discharge Summary (Signed)
Discharge summary job [id number]

## 2016-02-04 NOTE — Progress Notes (Signed)
Subjective/Complaints: Left rib pain somewhat better, gets relief from Ultracet Patient denies any dysuria. Had been on trimethoprim as a suppressive agent for recurrent UTIs prior to admission  ROSneg  nausea, vomiting, diarrhea, constipation,   Objective: Vital Signs: Blood pressure 140/51, pulse 74, temperature 97.6 F (36.4 C), temperature source Oral, resp. rate 18, last menstrual period 09/12/1973, SpO2 98 %. Dg Ribs Unilateral Left  02/02/2016  CLINICAL DATA:  Left posterior rib pain, status post fall. EXAM: LEFT RIBS - 2 VIEW COMPARISON:  None. FINDINGS: No displaced fracture or other bone lesions are seen involving the visualized ribs. No pulmonary mass or infiltrate noted. IMPRESSION: No evidence of displaced rib fractures. Electronically Signed   By: Fidela Salisbury M.D.   On: 02/02/2016 09:08   Results for orders placed or performed during the hospital encounter of 01/29/16 (from the past 72 hour(s))  CBC     Status: Abnormal   Collection Time: 02/03/16  7:04 AM  Result Value Ref Range   WBC 6.8 4.0 - 10.5 K/uL   RBC 3.10 (L) 3.87 - 5.11 MIL/uL   Hemoglobin 8.9 (L) 12.0 - 15.0 g/dL   HCT 28.0 (L) 36.0 - 46.0 %   MCV 90.3 78.0 - 100.0 fL   MCH 28.7 26.0 - 34.0 pg   MCHC 31.8 30.0 - 36.0 g/dL   RDW 14.0 11.5 - 15.5 %   Platelets 349 150 - 400 K/uL  Urinalysis, Routine w reflex microscopic (not at [facility])     Status: Abnormal   Collection Time: 02/03/16  1:00 PM  Result Value Ref Range   Color, Urine YELLOW YELLOW   APPearance CLOUDY (A) CLEAR   Specific Gravity, Urine 1.013 1.005 - 1.030   pH 6.0 5.0 - 8.0   Glucose, UA NEGATIVE NEGATIVE mg/dL   Hgb urine dipstick LARGE (A) NEGATIVE   Bilirubin Urine NEGATIVE NEGATIVE   Ketones, ur NEGATIVE NEGATIVE mg/dL   Protein, ur NEGATIVE NEGATIVE mg/dL   Nitrite NEGATIVE NEGATIVE   Leukocytes, UA MODERATE (A) NEGATIVE  Urine microscopic-add on     Status: Abnormal   Collection Time: 02/03/16  1:00 PM  Result Value Ref  Range   Squamous Epithelial / LPF 6-30 (A) NONE SEEN   WBC, UA 6-30 0 - 5 WBC/hpf   RBC / HPF 6-30 0 - 5 RBC/hpf   Bacteria, UA MANY (A) NONE SEEN     HEENT: normal Cardio: RRR and no murmurs Resp: CTA B/L and uunlabored GI: BS positive and nontender, nondistended Extremity:  No Edema Skin:   Intact Neuro: Alert/Oriented, Normal Sensory, Normal Motor and Other no evidence of aphasia Musc/Skel:  Other tenderness over the left lower ribs Gen. no acute distress   Assessment/Plan: 1. Functional deficits secondary to left MCA infarct which require 3+ hours per day of interdisciplinary therapy in a comprehensive inpatient rehab setting. Physiatrist is providing close team supervision and 24 hour management of active medical problems listed below. Physiatrist and rehab team continue to assess barriers to discharge/monitor patient progress toward functional and medical goals. FIM: Function - Bathing Position: Shower Body parts bathed by patient: Right arm, Left arm, Chest, Abdomen, Front perineal area, Buttocks, Right upper leg, Left upper leg, Back, Right lower leg, Left lower leg Body parts bathed by helper: Left lower leg, Right lower leg Assist Level: More than reasonable time  Function- Upper Body Dressing/Undressing What is the patient wearing?: Bra, Pull over shirt/dress Bra - Perfomed by patient: Thread/unthread right bra strap, Thread/unthread left bra  strap, Hook/unhook bra (pull down sports bra) Pull over shirt/dress - Perfomed by patient: Thread/unthread right sleeve, Thread/unthread left sleeve, Put head through opening, Pull shirt over trunk Assist Level: More than reasonable time Function - Lower Body Dressing/Undressing What is the patient wearing?: Underwear, Pants, Shoes, Ted Hose Position: Wheelchair/chair at [employer] - Performed by patient: Thread/unthread right underwear leg, Thread/unthread left underwear leg, Pull underwear up/down Pants- Performed by  patient: Thread/unthread right pants leg, Thread/unthread left pants leg, Pull pants up/down, Fasten/unfasten pants Non-skid slipper socks- Performed by patient: Don/doff right sock, Don/doff left sock Non-skid slipper socks- Performed by helper: Don/doff right sock, Don/doff left sock (OT donned) Shoes - Performed by patient: Don/doff right shoe, Don/doff left shoe TED Hose - Performed by helper: Don/doff right TED hose, Don/doff left TED hose Assist for footwear: Independent Assist for lower body dressing: More than reasonable time  Function - Toileting Toileting steps completed by patient: Adjust clothing prior to toileting, Performs perineal hygiene, Adjust clothing after toileting Toileting steps completed by helper: Adjust clothing prior to toileting Toileting Assistive Devices: Grab bar or rail Assist level: More than reasonable time  Function - Air cabin crew transfer assistive device: Grab bar, Cane Assist level to toilet: No Help, no cues, assistive device, takes more than a reasonable amount of time Assist level from toilet: No Help, no cues, assistive device, takes more than a reasonable amount of time  Function - Chair/bed transfer Chair/bed transfer method: Ambulatory Chair/bed transfer assist level: Touching or steadying assistance (Pt > 75%) Chair/bed transfer assistive device: Armrests, Cane Chair/bed transfer details: Verbal cues for sequencing, Verbal cues for safe use of DME/AE  Function - Locomotion: Wheelchair Will patient use wheelchair at discharge?: No Function - Locomotion: Ambulation Assistive device: Cane-straight Max distance: 250 Assist level: Moderate assist (Pt 50 - 74%) (1 LOB L) Assist level: Moderate assist (Pt 50 - 74%) Assist level: Moderate assist (Pt 50 - 74%) Assist level: Moderate assist (Pt 50 - 74%) Assist level: Touching or steadying assistance (Pt > 75%)  Function - Comprehension Comprehension: Auditory Comprehension assist  level: Follows complex conversation/direction with no assist  Function - Expression Expression: Verbal Expression assist level: Expresses complex ideas: With extra time/assistive device  Function - Social Interaction Social Interaction assist level: Interacts appropriately with others with medication or extra time (anti-anxiety, antidepressant).  Function - Problem Solving Problem solving assist level: Solves complex problems: With extra time  Function - Memory Memory assist level: More than reasonable amount of time Patient normally able to recall (first 3 days only): Current season, Location of own room, Staff names and faces, That he or she is in a hospital  Medical Problem List and Plan: 1. Right hemiparesis and gait/functional deficits secondary to left MCA infarct status post revascularization of occluded left MCA continue PT OT CIR level Plan discharge for tomorrow morning              2. DVT Prophylaxis/Anticoagulation: Eliquis. Monitor hemoglobin, given history of GI bleed 3. Pain Management: No improvement withTylenol, but is getting some relief with Ultracet, rib x-rays look unremarkable, report is pending, no irregularities noted on the iliac crest either 4. Hypertension/ Atrial fibrillation. Cardiac rate controlled. Cardizem 60 mg 3 times a day, Lopressor 100 mg twice a day. Monitor with increased mobility Filed Vitals:   02/03/16 2046 02/04/16 0428  BP: 134/91 140/51  Pulse: 90 74  Temp:  97.6 F (36.4 C)  Resp:  18   5. Neuropsych: This patient is  capable of making decisions on her own behalf. 6. Skin/Wound Care: Routine skin checks 7. Fluids/Electrolytes/Nutrition: Routine I&O's follow-up chemistries 8. Recent GI bleed requiring transfusion. Follow-up gastroenterology as needed. Continue Protonix. 9. History of right breast cancer. Continue armidex.this can cause musculoskeletal pain 10. Hyperlipidemia. Lipitor 11. Hemorrhagic cystitis will start Keflex, patient  instructed to resume trimethoprim after Keflex course has been completed LOS (Days) 6 A FACE TO FACE EVALUATION WAS PERFORMED  KIRSTEINS,ANDREW E 02/04/2016, 8:48 AM

## 2016-02-05 DIAGNOSIS — R269 Unspecified abnormalities of gait and mobility: Secondary | ICD-10-CM

## 2016-02-05 DIAGNOSIS — I69398 Other sequelae of cerebral infarction: Secondary | ICD-10-CM

## 2016-02-05 LAB — URINE CULTURE

## 2016-02-05 MED ORDER — DILTIAZEM HCL 60 MG PO TABS: 60.0000 mg | ORAL_TABLET | Freq: Three times a day (TID) | ORAL | Status: DC

## 2016-02-05 MED ORDER — CEPHALEXIN 250 MG PO CAPS: 250.0000 mg | ORAL_CAPSULE | Freq: Three times a day (TID) | ORAL | Status: DC

## 2016-02-05 MED ORDER — APIXABAN 5 MG PO TABS: 5.0000 mg | ORAL_TABLET | Freq: Two times a day (BID) | ORAL | Status: DC

## 2016-02-05 MED ORDER — ATORVASTATIN CALCIUM 20 MG PO TABS: 20.0000 mg | ORAL_TABLET | Freq: Every day | ORAL | Status: DC

## 2016-02-05 MED ORDER — TRAMADOL-ACETAMINOPHEN 37.5-325 MG PO TABS: 1.0000 | ORAL_TABLET | Freq: Four times a day (QID) | ORAL | Status: DC | PRN

## 2016-02-05 MED ORDER — ANASTROZOLE 1 MG PO TABS: 1.0000 mg | ORAL_TABLET | Freq: Every day | ORAL | Status: DC

## 2016-02-05 MED ORDER — METOPROLOL TARTRATE 100 MG PO TABS: 100.0000 mg | ORAL_TABLET | Freq: Two times a day (BID) | ORAL | Status: DC

## 2016-02-05 NOTE — Progress Notes (Signed)
Pt discharged to home accompanied by family.  VSS, denies pain; pt and husband verbalize understanding of d/c instructions.

## 2016-02-05 NOTE — Progress Notes (Signed)
Social Work Discharge Note  The overall goal for the admission was met for:   Discharge location: Yes - home  Length of Stay: Yes - 7 days  Discharge activity level: Yes - supervision  Home/community participation: Yes  Services provided included: MD, RD, PT, OT, SLP, RN, Pharmacy and SW  Financial Services: Medicare and Private Insurance: [employer]  Follow-up services arranged: Outpatient: PT/OT, DME: [place] walker and transport chair and Patient/Family request agency HH: [facility], DME: [facility]  Comments (or additional information):  Pt to return to her home with her family to provide 24/7 supervision.  Pt will go for outpt therapy and has the above DME for home use.  Stroke support group information given.  Patient/Family verbalized understanding of follow-up arrangements: Yes  Individual responsible for coordination of the follow-up plan: pt, pt's husband, and pt's dtr  Confirmed correct DME delivered: Trey Sailors 02/05/2016    Aqsa Sensabaugh, Silvestre Mesi

## 2016-02-05 NOTE — Patient Care Conference (Signed)
Inpatient RehabilitationTeam Conference and Plan of Care Update Date: 02/03/2016   Time: 10:30 AM    Patient Name: Adrienne [name]      Medical Record Number: [id number]  Date of Birth: [birth year] Sex: Female         Room/Bed: 4W08C/4W08C-01 Payor Info: Payor: MEDICARE / Plan: MEDICARE PART A AND B / Product Type: *No Product type* /    Admitting Diagnosis: LT CVA  Admit Date/Time:  01/29/2016  5:45 PM Admission Comments: No comment available   Primary Diagnosis:  <principal problem not specified> Principal Problem: <principal problem not specified>  Patient Active Problem List   Diagnosis Date Noted  . Diplopia 01/29/2016  . GERD (gastroesophageal reflux disease) 01/29/2016  . Left middle cerebral artery stroke ([place]) 01/29/2016  . Persistent atrial fibrillation ([place])   . Gastrointestinal hemorrhage associated with intestinal diverticulosis   . Gait disturbance, post-stroke 01/27/2016  . Fall   . Secondary hypertension, unspecified   . Cerebral infarction due to thrombosis of left middle cerebral artery (HCC) s/p mechanical thrombectomy   . Malnutrition of moderate degree 01/09/2016  . GI bleed 01/06/2016  . Chronic atrial fibrillation ([place]) 06/10/2015  . Pulmonary hypertension, secondary ([place]) 06/10/2015  . Chronic anticoagulation 06/10/2015  . Hyperlipidemia 06/10/2015  . Essential hypertension 06/10/2015  . History of stroke 06/10/2015  . Coronary artery disease due to lipid rich plaque 06/10/2015  . Afib ([place])   . Elevated uric acid in blood   . Arthritis of ankle   . Pulmonary HTN [facility])     Expected Discharge Date: Expected Discharge Date: 02/05/16  Team Members Present: Physician leading conference: Dr. Alysia Penna Social Worker Present: Alfonse Alpers, LCSW Nurse Present: Dorien Chihuahua, RN PT Present: Georjean Mode, Dustin Folks, PT OT Present: Clyda Greener, OT SLP Present: Windell Moulding, SLP PPS Coordinator present : Daiva Nakayama, RN, CRRN     Current  Status/Progress Goal Weekly Team Focus  Medical   Patient progressing well, cognitive deficits nearly resolved. Still has some balance issues  Home at a supervision to modified level, medical stability  Discharge planning   Bowel/Bladder   continent of bowel and bladder. increased frequency  Mod I  continue POC with b/b   Swallow/Nutrition/ Hydration             ADL's   supervision overall with SPC  mod I overall (except for S to shower stall)  ADL retraining, balance training, ther ex, pt/family education   Mobility   S transfers, min guard gait with SPC, minA stairs   mod I bed mobility, transfers, ambulation in home, S car transfers and stairs  dynamic standing balance, gait training, LE strengthening, endurance   Communication   mild higher level word finding impairments, mod I for basic, supervision for abstract when multitasking   independent   continue education and carryover of compensatory strategies   Safety/Cognition/ Behavioral Observations            Pain   pain to left flank area- Kpad in uses, PRN tylenol 650mg  q4hrs, 1 ultracet PRN q4hrs for pain  3 or less  assess pain and medicate as needed   Skin   scattered bruising throughout body  free of skin breakdown min assist  asses skin qshift    Rehab Goals Patient on target to meet rehab goals: Yes Rehab Goals Revised: none *See Care Plan and progress notes for long and short-term goals.  Barriers to Discharge: Needs equipment    Possible Resolutions to Barriers:  Discharge To home with home health follow-up    Discharge Planning/Teaching Needs:  Pt to return to her home with her husband to provide 24/7 supervision.  Husband and dtr have received family education.   Team Discussion:  Pt with left flank pain and MD feels that is due to positioning/posture.  Pt has come a long way cognitively and is pretty strong.  Pt is supervision with transfers and min assist with stairs.  PT continues to work on activity  tolerance and standing balance.  Pt is mod I in the room with OT tasks with extra time; has some SOB after activity.  Speech stated mod I with basic word finding, needing more cues with abstract concepts.  Pt is doing a good job carrying over and using compensatory strategies.  Pt will not need speech f/u.  Revisions to Treatment Plan:  none   Continued Need for Acute Rehabilitation Level of Care: The patient requires daily medical management by a physician with specialized training in physical medicine and rehabilitation for the following conditions: Daily direction of a multidisciplinary physical rehabilitation program to ensure safe treatment while eliciting the highest outcome that is of practical value to the patient.: Yes Daily medical management of patient stability for increased activity during participation in an intensive rehabilitation regime.: Yes Daily analysis of laboratory values and/or radiology reports with any subsequent need for medication adjustment of medical intervention for : Neurological problems  Farah Lepak, Silvestre Mesi 02/05/2016, 10:45 AM

## 2016-02-05 NOTE — Progress Notes (Signed)
Social Work Patient ID: Adrienne [name], female   DOB: March 09, 1928, 82 y.o.   MRN: [id number]   CSW met with pt, her husband, and her dtr to update them on team conference discussion, including targeted d/c date of 02-05-16.  Pt and family were pleased she'd progressed so well and are comfortable with d/c date.  They prefer outpt therapies over [facility] and CSW will set this up.  Pt has decided with PT that a walker will be safer for her and they want a transport chair.  CSW to arrange outpt therapies through [facility] and order DME through [place].  Pt asked if she would go home on Ultracet, as it has helped with her pain.  CSW told Marlowe Shores, PA, of pt's request and he will work with her and Dr. Letta Pate on this.  CSW remains available to assist as needed.

## 2016-02-05 NOTE — Progress Notes (Signed)
Social Work Patient ID: [name], female   DOB: 07/24/1928, 82 y.o.   MRN: [id number]   Lynnda Child, LCSW Social Worker Signed  Patient Care Conference 02/05/2016 10:18 AM    Expand All Collapse All   Inpatient RehabilitationTeam Conference and Plan of Care Update Date: 02/03/2016   Time: 10:30 AM     Patient Name: Adrienne [name]       Medical Record Number: [id number]  Date of Birth: [birth year] Sex: Female         Room/Bed: 4W08C/4W08C-01 Payor Info: Payor: MEDICARE / Plan: MEDICARE PART A AND B / Product Type: *No Product type* /    Admitting Diagnosis: LT CVA   Admit Date/Time:  01/29/2016  5:45 PM Admission Comments: No comment available   Primary Diagnosis:  <principal problem not specified> Principal Problem: <principal problem not specified>    Patient Active Problem List     Diagnosis  Date Noted   .  Diplopia  01/29/2016   .  GERD (gastroesophageal reflux disease)  01/29/2016   .  Left middle cerebral artery stroke ([place])  01/29/2016   .  Persistent atrial fibrillation ([place])     .  Gastrointestinal hemorrhage associated with intestinal diverticulosis     .  Gait disturbance, post-stroke  01/27/2016   .  Fall     .  Secondary hypertension, unspecified     .  Cerebral infarction due to thrombosis of left middle cerebral artery (HCC) s/p mechanical thrombectomy     .  Malnutrition of moderate degree  01/09/2016   .  GI bleed  01/06/2016   .  Chronic atrial fibrillation ([place])  06/10/2015   .  Pulmonary hypertension, secondary ([place])  06/10/2015   .  Chronic anticoagulation  06/10/2015   .  Hyperlipidemia  06/10/2015   .  Essential hypertension  06/10/2015   .  History of stroke  06/10/2015   .  Coronary artery disease due to lipid rich plaque  06/10/2015   .  Afib ([place])     .  Elevated uric acid in blood     .  Arthritis of ankle     .  Pulmonary HTN [facility])       Expected Discharge Date: Expected Discharge Date: 02/05/16  Team Members Present: Physician  leading conference: Dr. Alysia Penna Social Worker Present: Alfonse Alpers, LCSW Nurse Present: Dorien Chihuahua, RN PT Present: Georjean Mode, Dustin Folks, PT OT Present: Clyda Greener, OT SLP Present: Windell Moulding, SLP PPS Coordinator present : Daiva Nakayama, RN, CRRN        Current Status/Progress  Goal  Weekly Team Focus   Medical     Patient progressing well, cognitive deficits nearly resolved. Still has some balance issues  Home at a supervision to modified level, medical stability   Discharge planning   Bowel/Bladder     continent of bowel and bladder. increased frequency   Mod I  continue POC with b/b   Swallow/Nutrition/ Hydration               ADL's     supervision overall with SPC  mod I overall (except for S to shower stall)   ADL retraining, balance training, ther ex, pt/family education    Mobility     S transfers, min guard gait with SPC, minA stairs   mod I bed mobility, transfers, ambulation in home, S car transfers and stairs  dynamic standing balance, gait training, LE strengthening, endurance   Communication  mild higher level word finding impairments, mod I for basic, supervision for abstract when multitasking   independent   continue education and carryover of compensatory strategies    Safety/Cognition/ Behavioral Observations              Pain     pain to left flank area- Kpad in uses, PRN tylenol 650mg  q4hrs, 1 ultracet PRN q4hrs for pain  3 or less  assess pain and medicate as needed    Skin     scattered bruising throughout body   free of skin breakdown min assist  asses skin qshift    Rehab Goals Patient on target to meet rehab goals: Yes Rehab Goals Revised: none *See Care Plan and progress notes for long and short-term goals.    Barriers to Discharge:  Needs equipment     Possible Resolutions to Barriers:   Discharge To home with home health follow-up      Discharge Planning/Teaching Needs:   Pt to return to her home with her husband  to provide 24/7 supervision.  Husband and dtr have received family education.    Team Discussion:    Pt with left flank pain and MD feels that is due to positioning/posture.  Pt has come a long way cognitively and is pretty strong.  Pt is supervision with transfers and min assist with stairs.  PT continues to work on activity tolerance and standing balance.  Pt is mod I in the room with OT tasks with extra time; has some SOB after activity.  Speech stated mod I with basic word finding, needing more cues with abstract concepts.  Pt is doing a good job carrying over and using compensatory strategies.  Pt will not need speech f/u.   Revisions to Treatment Plan:    none    Continued Need for Acute Rehabilitation Level of Care: The patient requires daily medical management by a physician with specialized training in physical medicine and rehabilitation for the following conditions: Daily direction of a multidisciplinary physical rehabilitation program to ensure safe treatment while eliciting the highest outcome that is of practical value to the patient.: Yes Daily medical management of patient stability for increased activity during participation in an intensive rehabilitation regime.: Yes Daily analysis of laboratory values and/or radiology reports with any subsequent need for medication adjustment of medical intervention for : Neurological problems  Dwayna Kentner, Silvestre Mesi 02/05/2016, 10:45 AM

## 2016-02-05 NOTE — Progress Notes (Signed)
Subjective/Complaints: Left rib pain somewhat better, gets relief from Ultracet Patient denies any dysuria. Had been on trimethoprim as a suppressive agent for recurrent UTIs prior to admission  ROSneg  nausea, vomiting, diarrhea, constipation,   Objective: Vital Signs: Blood pressure 132/54, pulse 71, temperature 97.7 F (36.5 C), temperature source Oral, resp. rate 18, last menstrual period 09/12/1973, SpO2 97 %. No results found. Results for orders placed or performed during the hospital encounter of 01/29/16 (from the past 72 hour(s))  CBC     Status: Abnormal   Collection Time: 02/03/16  7:04 AM  Result Value Ref Range   WBC 6.8 4.0 - 10.5 K/uL   RBC 3.10 (L) 3.87 - 5.11 MIL/uL   Hemoglobin 8.9 (L) 12.0 - 15.0 g/dL   HCT 28.0 (L) 36.0 - 46.0 %   MCV 90.3 78.0 - 100.0 fL   MCH 28.7 26.0 - 34.0 pg   MCHC 31.8 30.0 - 36.0 g/dL   RDW 14.0 11.5 - 15.5 %   Platelets 349 150 - 400 K/uL  Urine culture     Status: Abnormal   Collection Time: 02/03/16  1:00 PM  Result Value Ref Range   Specimen Description URINE, CLEAN CATCH    Special Requests NONE    Culture >=100,000 COLONIES/mL ENTEROBACTER CLOACAE (A)    Report Status 02/05/2016 FINAL    Organism ID, Bacteria ENTEROBACTER CLOACAE (A)       Susceptibility   Enterobacter cloacae - MIC*    CEFAZOLIN >=64 RESISTANT Resistant     CEFTRIAXONE <=1 SENSITIVE Sensitive     CIPROFLOXACIN <=0.25 SENSITIVE Sensitive     GENTAMICIN <=1 SENSITIVE Sensitive     IMIPENEM <=0.25 SENSITIVE Sensitive     NITROFURANTOIN 64 INTERMEDIATE Intermediate     TRIMETH/SULFA <=20 SENSITIVE Sensitive     PIP/TAZO <=4 SENSITIVE Sensitive     * >=100,000 COLONIES/mL ENTEROBACTER CLOACAE  Urinalysis, Routine w reflex microscopic (not at [facility])     Status: Abnormal   Collection Time: 02/03/16  1:00 PM  Result Value Ref Range   Color, Urine YELLOW YELLOW   APPearance CLOUDY (A) CLEAR   Specific Gravity, Urine 1.013 1.005 - 1.030   pH 6.0 5.0 - 8.0   Glucose, UA NEGATIVE NEGATIVE mg/dL   Hgb urine dipstick LARGE (A) NEGATIVE   Bilirubin Urine NEGATIVE NEGATIVE   Ketones, ur NEGATIVE NEGATIVE mg/dL   Protein, ur NEGATIVE NEGATIVE mg/dL   Nitrite NEGATIVE NEGATIVE   Leukocytes, UA MODERATE (A) NEGATIVE  Urine microscopic-add on     Status: Abnormal   Collection Time: 02/03/16  1:00 PM  Result Value Ref Range   Squamous Epithelial / LPF 6-30 (A) NONE SEEN   WBC, UA 6-30 0 - 5 WBC/hpf   RBC / HPF 6-30 0 - 5 RBC/hpf   Bacteria, UA MANY (A) NONE SEEN     HEENT: normal Cardio: RRR and no murmurs Resp: CTA B/L and uunlabored GI: BS positive and nontender, nondistended Extremity:  No Edema Skin:   Intact Neuro: Alert/Oriented, Normal Sensory, Normal Motor and Other no evidence of aphasia Musc/Skel:  Other tenderness over the left lower ribs Gen. no acute distress   Assessment/Plan: 1. Functional deficits secondary to left MCA infarct  Stable for D/C today F/u PCP in 3-4 weeks F/u PM&R 2 weeks, transitional care visit See D/C summary See D/C instructions FIM: Function - Bathing Position: Shower Body parts bathed by patient: Right arm, Left arm, Chest, Abdomen, Front perineal area, Buttocks, Right upper leg,  Left upper leg, Back, Right lower leg, Left lower leg Body parts bathed by helper: Left lower leg, Right lower leg Assist Level: More than reasonable time  Function- Upper Body Dressing/Undressing What is the patient wearing?: Bra, Pull over shirt/dress Bra - Perfomed by patient: Thread/unthread right bra strap, Thread/unthread left bra strap, Hook/unhook bra (pull down sports bra) Pull over shirt/dress - Perfomed by patient: Thread/unthread right sleeve, Thread/unthread left sleeve, Put head through opening, Pull shirt over trunk Assist Level: More than reasonable time Function - Lower Body Dressing/Undressing What is the patient wearing?: Underwear, Pants, Shoes, Ted Hose Position: Wheelchair/chair at [employer] -  Performed by patient: Thread/unthread right underwear leg, Thread/unthread left underwear leg, Pull underwear up/down Pants- Performed by patient: Thread/unthread right pants leg, Thread/unthread left pants leg, Pull pants up/down, Fasten/unfasten pants Non-skid slipper socks- Performed by patient: Don/doff right sock, Don/doff left sock Non-skid slipper socks- Performed by helper: Don/doff right sock, Don/doff left sock (OT donned) Shoes - Performed by patient: Don/doff right shoe, Don/doff left shoe, Fasten right, Fasten left TED Hose - Performed by helper: Don/doff right TED hose, Don/doff left TED hose Assist for footwear: Independent Assist for lower body dressing: More than reasonable time  Function - Toileting Toileting steps completed by patient: Adjust clothing prior to toileting, Performs perineal hygiene, Adjust clothing after toileting Toileting steps completed by helper: Adjust clothing prior to toileting Toileting Assistive Devices: Grab bar or rail Assist level: Supervision or verbal cues (at HS, mod i in room during day.)  Function - Air cabin crew transfer assistive device: Walker, Grab bar Assist level to toilet: No Help, no cues, assistive device, takes more than a reasonable amount of time Assist level from toilet: Supervision or verbal cues  Function - Chair/bed transfer Chair/bed transfer method: Ambulatory Chair/bed transfer assist level: No Help, no cues, assistive device, takes more than a reasonable amount of time Chair/bed transfer assistive device: Armrests, Cane Chair/bed transfer details: Verbal cues for sequencing, Verbal cues for safe use of DME/AE  Function - Locomotion: Wheelchair Will patient use wheelchair at discharge?: No Function - Locomotion: Ambulation Assistive device: Cane-straight Max distance: 250 Assist level: Moderate assist (Pt 50 - 74%) (1 LOB L) Assist level: Moderate assist (Pt 50 - 74%) Assist level: Moderate assist (Pt 50  - 74%) Assist level: Moderate assist (Pt 50 - 74%) Assist level: Touching or steadying assistance (Pt > 75%)  Function - Comprehension Comprehension: Auditory Comprehension assist level: Follows complex conversation/direction with no assist  Function - Expression Expression: Verbal Expression assist level: Expresses complex ideas: With no assist  Function - Social Interaction Social Interaction assist level: Interacts appropriately with others with medication or extra time (anti-anxiety, antidepressant).  Function - Problem Solving Problem solving assist level: Solves complex problems: With extra time  Function - Memory Memory assist level: Complete Independence: No helper Patient normally able to recall (first 3 days only): Current season, Location of own room, Staff names and faces, That he or she is in a hospital  Medical Problem List and Plan: 1. Right hemiparesis and gait/functional deficits secondary to left MCA infarct status post revascularization of occluded left MCA continue PT OT CIR level Plan discharge for tomorrow morning              2. DVT Prophylaxis/Anticoagulation: Eliquis. Monitor hemoglobin, given history of GI bleed 3. Pain Management: No improvement withTylenol, but is getting some relief with Ultracet, rib x-rays look unremarkable, report is pending, no irregularities noted on the iliac crest  either 4. Hypertension/ Atrial fibrillation. Cardiac rate controlled. Cardizem 60 mg 3 times a day, Lopressor 100 mg twice a day. Monitor with increased mobility Filed Vitals:   02/04/16 2132 02/05/16 0536  BP: 119/53 132/54  Pulse:  71  Temp:  97.7 F (36.5 C)  Resp:  18   5. Neuropsych: This patient is capable of making decisions on her own behalf. 6. Skin/Wound Care: Routine skin checks 7. Fluids/Electrolytes/Nutrition: Routine I&O's follow-up chemistries 8. Recent GI bleed requiring transfusion. Follow-up gastroenterology as needed. Continue Protonix. 9. History  of right breast cancer. Continue armidex.this can cause musculoskeletal pain 10. Hyperlipidemia. Lipitor 11. Hemorrhagic cystitis will start Keflex, patient instructed to resume trimethoprim after Keflex course has been completed LOS (Days) 7 A FACE TO FACE EVALUATION WAS PERFORMED  Alia Parsley E 02/05/2016, 8:53 AM

## 2016-02-05 NOTE — Discharge Instructions (Signed)
Inpatient Rehab Discharge Instructions  Adrienne [name] Discharge date and time: No discharge date for patient encounter.   Activities/Precautions/ Functional Status: Activity: activity as tolerated Diet: regular diet Wound Care: none needed Functional status:  ___ No restrictions     ___ Walk up steps independently ___ 24/7 supervision/assistance   ___ Walk up steps with assistance ___ Intermittent supervision/assistance  ___ Bathe/dress independently ___ Walk with walker     __x_ Bathe/dress with assistance ___ Walk Independently    ___ Shower independently ___ Walk with assistance    ___ Shower with assistance ___ No alcohol     ___ Return to work/school ________  COMMUNITY REFERRALS UPON DISCHARGE:   Outpatient: PT     OT  Agency:  [facility]                            [address], [address] [place], [place]  [address]  Phone:  [phone]  Appointment Date/Time:  Wednesday, May 31st at 2 PM - Occupational Therapy                                                       Thursday, June 8th at 9:30 AM - Physical Therapy (may call you sooner if there is a cancellation) Medical Equipment/Items Ordered:  Transport chair and rolling walker  Agency/Supplier:  [facility]     Phone:  [phone]  GENERAL COMMUNITY RESOURCES FOR PATIENT/FAMILY: Support Groups:  [facility] Stroke Support Group                              Meets the 2nd Thursday of the month at 3 PM (except for June, July, and August)                              In the dayroom of Inpatient Rehab at [facility] [phone])  Special Instructions:  Follow-up Dr. Lynford Citizen [address] [place], [place]. Office to call for appointment  Resume trimethoprim after Keflex completed   STROKE/TIA DISCHARGE INSTRUCTIONS SMOKING Cigarette smoking nearly doubles your risk of having a stroke & is the single most alterable risk  factor  If you smoke or have smoked in the last 12 months, you are advised to quit smoking for your health.  Most of the excess cardiovascular risk related to smoking disappears within a year of stopping.  Ask you doctor about anti-smoking medications  [place] Quit Line: 1-800-QUIT NOW  Free Smoking Cessation Classes [phone]  CHOLESTEROL Know your levels; limit fat & cholesterol in your diet  Lipid Panel     Component Value Date/Time   CHOL 143 01/26/2016 0300   TRIG 101 01/26/2016 0300   HDL 43 01/26/2016 0300   CHOLHDL 3.3 01/26/2016 0300   VLDL 20 01/26/2016 0300   LDLCALC 80 01/26/2016 0300      Many patients benefit from treatment even if their cholesterol is at goal.  Goal: Total Cholesterol (CHOL) less than 160  Goal:  Triglycerides (TRIG) less than 150  Goal:  HDL greater than 40  Goal:  LDL (LDLCALC) less than 100   BLOOD PRESSURE American Stroke Association blood pressure target is less that 120/80 mm/Hg  Your discharge blood pressure is:  BP: (!) 118/56 mmHg  Monitor your blood pressure  Limit your salt and alcohol intake  Many individuals will require more than one medication for high blood pressure  DIABETES (A1c is a blood sugar average for last 3 months) Goal HGBA1c is under 7% (HBGA1c is blood sugar average for last 3 months)  Diabetes: No known diagnosis of diabetes    Lab Results  Component Value Date   HGBA1C 5.6 01/26/2016     Your HGBA1c can be lowered with medications, healthy diet, and exercise.  Check your blood sugar as directed by your physician  Call your physician if you experience unexplained or low blood sugars.  PHYSICAL ACTIVITY/REHABILITATION Goal is 30 minutes at least 4 days per week  Activity: Increase activity slowly, Therapies: Physical Therapy: Home Health Return to work:   Activity decreases your risk of heart attack and stroke and makes your heart stronger.  It helps control your weight and blood pressure; helps you  relax and can improve your mood.  Participate in a regular exercise program.  Talk with your doctor about the best form of exercise for you (dancing, walking, swimming, cycling).  DIET/WEIGHT Goal is to maintain a healthy weight  Your discharge diet is: Diet regular Room service appropriate?: Yes; Fluid consistency:: Thin  liquids Your height is:    Your current weight is:   Your Body Mass Index (BMI) is:     Following the type of diet specifically designed for you will help prevent another stroke.  Your goal weight range is:    Your goal Body Mass Index (BMI) is 19-24.  Healthy food habits can help reduce 3 risk factors for stroke:  High cholesterol, hypertension, and excess weight.  RESOURCES Stroke/Support Group:  Call [phone]   STROKE EDUCATION PROVIDED/REVIEWED AND GIVEN TO PATIENT Stroke warning signs and symptoms How to activate emergency medical system (call 911). Medications prescribed at discharge. Need for follow-up after discharge. Personal risk factors for stroke. Pneumonia vaccine given:  Flu vaccine given:  My questions have been answered, the writing is legible, and I understand these instructions.  I will adhere to these goals & educational materials that have been provided to me after my discharge from the hospital.      My questions have been answered and I understand these instructions. I will adhere to these goals and the provided educational materials after my discharge from the hospital.  Patient/Caregiver Signature _______________________________ Date __________  Clinician Signature _______________________________________ Date __________  Please bring this form and your medication list with you to all your follow-up doctor's appointments.

## 2016-02-09 ENCOUNTER — Telehealth: Payer: Self-pay

## 2016-02-09 NOTE — Telephone Encounter (Signed)
Called pt to make TC appointment. Pt needed to reschedule to 02/29/16 at 11:30 am.

## 2016-02-09 NOTE — Telephone Encounter (Deleted)
1. Are you/is patient experiencing any problems since coming home? Are there any questions regarding any aspect of care? 2. Are there any questions regarding medications administration/dosing? Are meds being taken as prescribed? Patient should review meds with caller to confirm 3. Have there been any falls? 4. Has Home Health been to the house and/or have they contacted you? If not, have you tried to contact them? Can we help you contact them? 5. Are bowels and bladder emptying properly? Are there any unexpected incontinence issues? If applicable, is patient following bowel/bladder programs? 6. Any fevers, problems with breathing, unexpected pain? 7. Are there any skin problems or new areas of breakdown? 8. Has the patient/family member arranged specialty MD follow up (ie cardiology/neurology/renal/surgical/etc)?  Can we help arrange? 9. Does the patient need any other services or support that we can help arrange? 10. Are caregivers following through as expected in assisting the patient? 11. Has the patient quit smoking, drinking alcohol, or using drugs as recommended?  

## 2016-02-10 ENCOUNTER — Encounter: Payer: Self-pay | Admitting: Occupational Therapy

## 2016-02-10 ENCOUNTER — Ambulatory Visit: Payer: Medicare Other

## 2016-02-10 ENCOUNTER — Ambulatory Visit: Payer: Medicare Other | Attending: Family Medicine | Admitting: Occupational Therapy

## 2016-02-10 DIAGNOSIS — R29898 Other symptoms and signs involving the musculoskeletal system: Secondary | ICD-10-CM | POA: Diagnosis not present

## 2016-02-10 DIAGNOSIS — R2689 Other abnormalities of gait and mobility: Secondary | ICD-10-CM | POA: Diagnosis not present

## 2016-02-10 DIAGNOSIS — M6281 Muscle weakness (generalized): Secondary | ICD-10-CM | POA: Diagnosis not present

## 2016-02-10 DIAGNOSIS — M546 Pain in thoracic spine: Secondary | ICD-10-CM | POA: Diagnosis not present

## 2016-02-10 DIAGNOSIS — I69351 Hemiplegia and hemiparesis following cerebral infarction affecting right dominant side: Secondary | ICD-10-CM | POA: Insufficient documentation

## 2016-02-10 DIAGNOSIS — R278 Other lack of coordination: Secondary | ICD-10-CM | POA: Insufficient documentation

## 2016-02-10 NOTE — Therapy (Signed)
[facility] [address] [place], [place], [address] Phone: [phone]   Fax:  [phone]  Occupational Therapy Evaluation  Patient Details  Name: Adrienne [name] MRN: [id number] Date of Birth: 25-Nov-1927 Referring Provider: Dr Letta Pate  Encounter Date: 02/10/2016      OT End of Session - 02/10/16 1602    Visit Number 1   Number of Visits 9   Date for OT Re-Evaluation 03/16/16   Authorization Type Medicare - G code/ progress note 10th visit   Authorization - Visit Number 1   Authorization - Number of Visits 10   OT Start Time 1100   OT Stop Time 1149   OT Time Calculation (min) 49 min   Activity Tolerance Patient tolerated treatment well   Behavior During Therapy [facility] for tasks assessed/performed      Past Medical History  Diagnosis Date  . Afib ([place])   . Elevated uric acid in blood   . Arthritis of ankle   . Pulmonary HTN ([place])   . Long-term (current) use of anticoagulants   . Moderate mitral regurgitation   . CAD (coronary artery disease)   . Edema   . Cancer [facility]) 2012    breast cancer-right  . Hypertension   . Urinary incontinence   . TIA (transient ischemic attack) 09/2011  . Diverticulosis   . Compression fracture of T12 vertebra [facility])     Past Surgical History  Procedure Laterality Date  . Cardiac catheterization  2013  . Replacement total knee bilateral    . Gallbladder surgery    . Appendectomy    . Mastectomy Right   . Cataract extraction, bilateral    . Breast surgery  2012    Rt.mastectomy--[place]  . Tonsillectomy and adenoidectomy      as a child  . Dilation and curettage of uterus      in her early 30's for DUB  . Esophagogastroduodenoscopy Left 01/08/2016    Procedure: ESOPHAGOGASTRODUODENOSCOPY (EGD);  Surgeon: Arta Silence, MD;  Location: [facility];  Service: Endoscopy;  Laterality: Left;  . Radiology with anesthesia N/A 01/25/2016    Procedure: RADIOLOGY WITH  ANESTHESIA;  Surgeon: Luanne Bras, MD;  Location: [facility];  Service: Radiology;  Laterality: N/A;    There were no vitals filed for this visit.      Subjective Assessment - 02/10/16 1545    Subjective  I just don't have the energy to take care of things like I want to   Patient Stated Goals Increased independence with household chores / daily tasks           [facility] OT Assessment - 02/10/16 1115    Assessment   Diagnosis LMCA   Referring Provider Dr Letta Pate   Onset Date 01/25/16   Prior Therapy CIR Discharge 5/26   Precautions   Precautions Fall   Precaution Comments T12 Fracture   Restrictions   Weight Bearing Restrictions No   Balance Screen   Has the patient fallen in the past 6 months No   Is the patient reluctant to leave their home because of a fear of falling?  No   Prior Function   Level of Independence Independent with basic ADLs;Independent with homemaking with ambulation   Vocation Retired   Leisure gardening, reading   ADL   Psychologist, forensic Details (indicate cue type and reason Husband holds patients hand as she steps into and out of Retail banker  Shower seat with back;Walk in shower   Equipment Used Rolling walker   Transfers/Ambulation Related to ADL's walks with rolling walker   ADL comments Patient arrived with hair styled nicely, and makeup applied well.  Patient reports no difficulty with most basic self care tasks   IADL   Prior Level of Function Shopping Husband drives -shopped independently   [place] for transportation;Needs to be accompanied on any shopping trip   Prior Level of Function Light Housekeeping Independent   Light Housekeeping Performs light daily tasks such as dishwashing, bed making   Prior Level of Function Meal Prep Independent   Meal Prep Able to complete simple cold meal and snack prep;Able to complete simple warm meal prep   Prior Level of  Function Community Mobility No longer drives   Community Mobility Relies on family or friends for transportation   Prior Level of Function Financial Management Husband does family finances   Written Expression   Dominant Hand Right   Handwriting 100% legible   Vision - History   Baseline Vision Wears glasses only for reading   Visual History Cataracts   Patient Visual Report --  surgery both eyes last autumn   Additional Comments Patient reports diplopia immediately following stroke - although resolved now   Vision Assessment   Eye Alignment Within Functional Limits   Ocular Range of Motion Within Functional Limits   Alignment/Gaze Preference Within Defined Limits   Tracking/Visual Pursuits Able to track stimulus in all quads without difficulty   Visual Fields No apparent deficits   Diplopia Assessment Other (comment)  Reported diplopia initially - resolved per patient   Activity Tolerance   Activity Tolerance Tolerates 10-20 min activity with muiltiple rests   Activity Tolerance Comments Patient describes this as one of her biggest impairments following the stroke - she reports physical fatigue and resultant back pain with activity.  She reports decreased energy, decreased motivation for activity, and decreased physical stamina to complete a task   Cognition   Overall Cognitive Status Within Functional Limits for tasks assessed   Observation/Other Assessments   Focus on Therapeutic Outcomes (FOTO)  Incomplete intake at time of evaluation   Outcome Measures 9 hole peg test   Sensation   Light Touch Appears Intact   Coordination   Gross Motor Movements are Fluid and Coordinated No   Fine Motor Movements are Fluid and Coordinated No   9 Hole Peg Test Right;Left   Right 9 Hole Peg Test 26.22   Left 9 Hole Peg Test 23.44   ROM / Strength   AROM / PROM / Strength AROM;Strength   AROM   Overall AROM  Within functional limits for tasks performed  slightly kyphotic limiting end range  shoulder flexion / abdu   Overall AROM Comments B UE AROM WFL   Strength   Overall Strength Deficits   Right Shoulder Flexion 4-/5   Left Shoulder Flexion 4/5   Hand Function   Right Hand Gross Grasp Impaired   Right Hand Grip (lbs) 25   Right Hand Lateral Pinch 6 lbs   Left Hand Gross Grasp Impaired   Left Hand Grip (lbs) 25   Left Hand Lateral Pinch 10 lbs   Comment Significant joint deformity at [facility] right thumb base                         OT Education - 02/10/16 1622    Education Details --  also reviewed arthritis management  products for [facility] stability             OT Long Term Goals - 02/10/16 1614    OT LONG TERM GOAL #1   Title Patient will be independent with home exercise program designed to improve strength in UE's    Time 4   Period Weeks   Status New   OT LONG TERM GOAL #2   Title Patient will be modified independent for shower transfers   Time 4   Period Weeks   Status New   OT LONG TERM GOAL #3   Title Patient will demonstrate sufficient activity tolerance to complete 25-30 minutes of household work (simple cleaning) without rest break   Time 4   Period Weeks   Status New   OT LONG TERM GOAL #4   Title Patient will demonstrate improved strength in Bilateral UE's in gross grasp by 5 lbs to aide in IADL   Baseline 25 lb    OT LONG TERM GOAL #5   Title Patient will demonstrate improved stability in right hand for pinch.  Patient with significant arthritis in [facility] - May benefit from orthoses to increase stability   Baseline 6lb with poor biomechanics   Time 4   Period Weeks   Status New               Plan - 02/10/16 1604    Clinical Impression Statement Patient is an 82 year old woman recently discharged from inpatient rehab following a Left MCA stroke / revascularization.  Patient is an independnet woman, living with her husband in a 1 story home.  Her past medical history is significant for CAD, Afib, and recent GI bleed (was  chronic coumadin), and T12 fracture. Patient was hospitalized in April for endoscopy  / GI bleed, and discharged home  4/30.  On 5/15, patient was found down in bathroom after suffering LMCA stroke.  Patient was admitted, and received revascularization procedure, and was admitted to comprehensive in patient rehab program.  Patient reports many initial impairments have resolved, yet she still notices poor activity tolerance, mild right sided weakness/ incoordination, and persistent mid back pain.  Patient currently requires intermittent minimal assistance for ADL/IADL, and  finds she needs frequent rest breaks during the day to complete the most basic of home management tasks.  Patient will benefit from skilled OT Intervention to improve her strength and coordination in her right side as well as improve her overall conditioning to allow her to return to her previous level of activity with ADL/IADL.       Rehab Potential Excellent   OT Frequency 2x / week   OT Duration 4 weeks   OT Treatment/Interventions Self-care/ADL training;Neuromuscular education;Therapeutic exercise;Energy conservation;DME and/or AE instruction;Therapist, nutritional;Therapeutic activities;Therapeutic exercises;Patient/family education;Balance training   Plan Initiate HEP for hand strengthening (bilateral) and shoulder strengthening, UE conditioning - UBE   Consulted and Agree with Plan of Care Patient      Patient will benefit from skilled therapeutic intervention in order to improve the following deficits and impairments:  Decreased activity tolerance, Decreased balance, Decreased coordination, Decreased endurance, Decreased strength, Impaired UE functional use, Pain  Visit Diagnosis: Hemiplegia and hemiparesis following cerebral infarction affecting right dominant side (HCC) - Plan: Ot plan of care cert/re-cert  Muscle weakness (generalized) - Plan: Ot plan of care cert/re-cert  Other lack of coordination - Plan: Ot  plan of care cert/re-cert  Pain in thoracic spine - Plan: Ot plan of care cert/re-cert  G-Codes - 02/10/16 1621    Functional Assessment Tool Used 9 hole peg test   Functional Limitation Carrying, moving and handling objects   Carrying, Moving and Handling Objects Current Status [phone]) At least 20 percent but less than 40 percent impaired, limited or restricted   Carrying, Moving and Handling Objects Goal Status [id number]) At least 1 percent but less than 20 percent impaired, limited or restricted      Problem List Patient Active Problem List   Diagnosis Date Noted  . Diplopia 01/29/2016  . GERD (gastroesophageal reflux disease) 01/29/2016  . Left middle cerebral artery stroke ([place]) 01/29/2016  . Persistent atrial fibrillation ([place])   . Gastrointestinal hemorrhage associated with intestinal diverticulosis   . Gait disturbance, post-stroke 01/27/2016  . Fall   . Secondary hypertension, unspecified   . Cerebral infarction due to thrombosis of left middle cerebral artery (HCC) s/p mechanical thrombectomy   . Malnutrition of moderate degree 01/09/2016  . GI bleed 01/06/2016  . Chronic atrial fibrillation ([place]) 06/10/2015  . Pulmonary hypertension, secondary ([place]) 06/10/2015  . Chronic anticoagulation 06/10/2015  . Hyperlipidemia 06/10/2015  . Essential hypertension 06/10/2015  . History of stroke 06/10/2015  . Coronary artery disease due to lipid rich plaque 06/10/2015  . Afib ([place])   . Elevated uric acid in blood   . Arthritis of ankle   . Pulmonary HTN ([place])     Mariah Milling, OTR/L 02/10/2016, 4:26 PM  [facility] [address] [place], [place], [address] Phone: [phone]   Fax:  [phone]  Name: Enisa Roysdon MRN: [id number] Date of Birth: Jan 15, 1928

## 2016-02-10 NOTE — Therapy (Signed)
[facility] [address] [place], [place], [address] Phone: [phone]   Fax:  [phone]  Physical Therapy Evaluation  Patient Details  Name: Adrienne [name] MRN: [id number] Date of Birth: 07-04-1928 Referring Provider: Dr. Letta Pate  Encounter Date: 02/10/2016      PT End of Session - 02/10/16 1135    Visit Number 1   Number of Visits 17   Date for PT Re-Evaluation 04/10/16   Authorization Type G-code and progress note every 10th visit.   PT Start Time 1015   PT Stop Time 1101   PT Time Calculation (min) 46 min   Equipment Utilized During Treatment Gait belt   Activity Tolerance Patient tolerated treatment well   Behavior During Therapy WFL for tasks assessed/performed      Past Medical History  Diagnosis Date  . Afib ([place])   . Elevated uric acid in blood   . Arthritis of ankle   . Pulmonary HTN ([place])   . Long-term (current) use of anticoagulants   . Moderate mitral regurgitation   . CAD (coronary artery disease)   . Edema   . Cancer [facility]) 2012    breast cancer-right  . Hypertension   . Urinary incontinence   . TIA (transient ischemic attack) 09/2011  . Diverticulosis   . Compression fracture of T12 vertebra [facility])     Past Surgical History  Procedure Laterality Date  . Cardiac catheterization  2013  . Replacement total knee bilateral    . Gallbladder surgery    . Appendectomy    . Mastectomy Right   . Cataract extraction, bilateral    . Breast surgery  2012    Rt.mastectomy--[place]  . Tonsillectomy and adenoidectomy      as a child  . Dilation and curettage of uterus      in her early 7's for DUB  . Esophagogastroduodenoscopy Left 01/08/2016    Procedure: ESOPHAGOGASTRODUODENOSCOPY (EGD);  Surgeon: Arta Silence, MD;  Location: [facility];  Service: Endoscopy;  Laterality: Left;  . Radiology with anesthesia N/A 01/25/2016    Procedure: RADIOLOGY WITH ANESTHESIA;  Surgeon: Luanne Bras, MD;  Location: [facility];  Service: Radiology;  Laterality: N/A;    There were no vitals filed for this visit.       Subjective Assessment - 02/10/16 1024    Subjective Pt experienced double vision and difficulty speaking and was admitted to hospital for L CVA on 01/25/16. Pt d/c on 02/05/16 from CIR. Pt reported double vision is gone and speech is getting better, therefore, CIR speech therapist d/c pt. However, pt is sitll experiencing impaired balance and weakness. Pt amb. with RW for safety. Pt reports loss of appetite but reports she experienced this for approx. 1 month prior to CVA. Pt has appt. on 02/15/16 with PCP. Pt fell once during onset of CVA.  Pt reported she was able to amb. without an AD at home, prior to CVA but did hold onto her husband's arm when amb. in the community (she fell a few years ago). Pt is unsure if she wants a cane, and would like to try a rollator.  Pt reports she did experience LBP while in the hospital and it appears to be resolving with use of Tramadol.   Pertinent History A-fib, pulmonary HTN, HTN, hx of R breast CA in 2012, Hx of T12 compression fx (within the last year) but denied precautions, R ankle OA, Hx of GI hemorrhage with diverticulosis, CAD   Patient  Stated Goals Walk without any assistance   Currently in Pain? No/denies            [facility] PT Assessment - 02/10/16 1030    Assessment   Medical Diagnosis Stroke, L MCA   Referring Provider Dr. Letta Pate   Onset Date/Surgical Date 01/25/16   Hand Dominance Right   Prior Therapy CIR   Precautions   Precautions Fall   Precaution Comments Pt reports hx of T12 compression fracture this year but denied precautions   Restrictions   Weight Bearing Restrictions No   Balance Screen   Has the patient fallen in the past 6 months Yes   How many times? 1  during onset of CVA   Has the patient had a decrease in activity level because of a fear of falling?  Yes   Is the patient reluctant to leave their  home because of a fear of falling?  No   Home Environment   Living Environment Private residence   Living Arrangements Spouse/significant other   Available Help at Discharge Family   Type of Home Other(Comment)  [place] to enter   Entrance Stairs-Number of Steps 1  for front, but pt uses garage entry with no steps   Entrance Stairs-Rails None   Home Layout One level   Bear - 2 wheels;Shower seat;Cane - single point;Wheelchair - manual  "slim w/c"   Prior Function   Level of Independence Independent   Vocation Retired   Associate Professor   Overall Cognitive Status Impaired/Different from baseline   Area of Impairment --  pt reported word finding issues p CVA but d/c from speech   Sensation   Light Touch Appears Intact   Additional Comments Pt denied N/T.   Coordination   Gross Motor Movements are Fluid and Coordinated Yes   Fine Motor Movements are Fluid and Coordinated Yes   ROM / Strength   AROM / PROM / Strength AROM;Strength   AROM   Overall AROM  Within functional limits for tasks performed   Overall AROM Comments B LE AROM WFL.   Strength   Overall Strength Deficits   Overall Strength Comments Gross hip abd add tested in seated position with B hip abd. weakness noted, will formally assess next session.   Right Hip Flexion 4/5   Left Hip Flexion 4/5   Right Knee Flexion 4/5   Right Knee Extension 4+/5   Left Knee Flexion 4/5   Left Knee Extension 4+/5   Right Ankle Dorsiflexion 4/5   Left Ankle Dorsiflexion 4/5   Transfers   Transfers Sit to Stand;Stand to Sit   Sit to Stand 5: Supervision;With upper extremity assist;From chair/3-in-1   Stand to Sit 5: Supervision;With upper extremity assist;To chair/3-in-1   Ambulation/Gait   Ambulation/Gait Yes   Ambulation/Gait Assistance 5: Supervision   Ambulation/Gait Assistance Details No overt LOB, but incr. time to perform safe turn.   Ambulation Distance (Feet) 100 Feet   Assistive device  Rolling walker   Gait Pattern Step-through pattern;Decreased stride length;Trendelenburg;Trunk flexed   Ambulation Surface Level;Indoor   Gait velocity 1.72ft/sec with RW   Gait velocity - backwards --   Standardized Balance Assessment   Standardized Balance Assessment Berg Balance Test;Timed Up and Go Test   Berg Balance Test   Sit to Stand Able to stand  independently using hands   Standing Unsupported Able to stand safely 2 minutes   Sitting with Back Unsupported but Feet Supported on Floor or  Stool Able to sit safely and securely 2 minutes   Stand to Sit Sits safely with minimal use of hands   Transfers Able to transfer safely, definite need of hands   Standing Unsupported with Eyes Closed Able to stand 10 seconds with supervision   Standing Ubsupported with Feet Together Able to place feet together independently and stand 1 minute safely   From Standing, Reach Forward with Outstretched Arm Can reach forward >12 cm safely (5")  8.5"   From Standing Position, Pick up Object from [place] to pick up shoe safely and easily   From Standing Position, Turn to Look Behind Over each Shoulder Looks behind from both sides and weight shifts well   Turn 360 Degrees Able to turn 360 degrees safely but slowly   Standing Unsupported, Alternately Place Feet on Step/Stool Able to complete 4 steps without aid or supervision   Standing Unsupported, One Foot in [place] to take small step independently and hold 30 seconds   Standing on One Leg Tries to lift leg/unable to hold 3 seconds but remains standing independently   Total Score 43   Timed Up and Go Test   TUG Normal TUG   Normal TUG (seconds) 15.9                           PT Education - 02/10/16 1134    Education provided Yes   Education Details PT discussed frequency/duration and outcome measure results   Person(s) Educated Patient   Methods Explanation   Comprehension Verbalized understanding          PT Short  Term Goals - 02/10/16 1138    PT SHORT TERM GOAL #1   Title Pt will be IND in HEP in order to improve balance and strength. Target date: 03/09/16   Status New   PT SHORT TERM GOAL #2   Title Pt will improve BERG score to >/=47/56 to decr. falls risk. Target date: 03/09/16   Status New   PT SHORT TERM GOAL #3   Title Pt will perform TUG with LRAD in </=13.5sec. to decr. falls risk. Target date: 03/09/16   Status New   PT SHORT TERM GOAL #4   Title Pt will amb. 300' over even terrain with LRAD at MOD I level to improve functional mobilty. Target date: 03/09/16   Status New           PT Long Term Goals - 02/10/16 1140    PT LONG TERM GOAL #1   Title Pt will verbalize understanding of CVA symptoms/risk factors to decr. risk of additonal CVA. Target date: 04/06/16   Status New   PT LONG TERM GOAL #2   Title Pt will improve BERG score to >/=51/56 to decr. falls risk. Target date: 04/06/16   Status New   PT LONG TERM GOAL #3   Title Pt will be able to ascend/descend one step without handrail, with LRAD, at MOD I level to traverse step at home. Target date: 04/06/16   Status New   PT LONG TERM GOAL #4   Title Pt will amb. 500' over even/uneven terrain with LRAD at MOD I level to improve functional mobility. Target date: 04/06/16   Status New   PT LONG TERM GOAL #5   Title Pt will improve gait speed with LRAD to >/=2.32ft/sec. to safely amb. in the community. Target date: 04/06/16  Plan - 02/10/16 1135    Clinical Impression Statement Pt is a pleasant 82y/o female presenting to OPPT neuro s/p L CVA. Pt exam revealed the following impairments: gait deviations, LE weakness, impaired balance, decreased endurance, and impaired posture. Pt reported she continues to experience word finding issues, but was d/c from speech while in CIR. PT will continue to monitor and ask for speech referral prn. Pt's BERG score and TUG time indicate pt is at risk for falls. Pt's gait speed indicates  she is not able to safely amb. in the community.  Pt reported denied pain during session but reported intermittent LBP, PT will directly address but will monitor closely.   Rehab Potential Good   Clinical Impairments Affecting Rehab Potential co-morbidities   PT Frequency 2x / week   PT Duration 8 weeks   PT Treatment/Interventions ADLs/Self Care Home Management;Biofeedback;Canalith Repostioning;Vestibular;Manual techniques;Balance training;Therapeutic exercise;Therapeutic activities;Functional mobility training;Stair training;Gait training;DME Instruction;Patient/family education;Neuromuscular re-education;Orthotic Fit/Training   PT Next Visit Plan Formally test B hip abd strength. Initiate balance and strengthening HEP. Provide pt with CVA ed handout.   Recommended Other Services Potential speech referral   Consulted and Agree with Plan of Care Patient      Patient will benefit from skilled therapeutic intervention in order to improve the following deficits and impairments:  Abnormal gait, Decreased endurance, Decreased knowledge of use of DME, Postural dysfunction, Decreased balance, Decreased mobility  Visit Diagnosis: Other abnormalities of gait and mobility - Plan: PT plan of care cert/re-cert  Weakness of both lower extremities - Plan: PT plan of care cert/re-cert      G-Codes - [id number]    Functional Assessment Tool Used BERG: 43/56; gait speed with RW: 1.60ft/sec; TUG with RW: 15.9sec.   Functional Limitation Mobility: Walking and moving around   Mobility: Walking and Moving Around Current Status [phone]) At least 40 percent but less than 60 percent impaired, limited or restricted   Mobility: Walking and Moving Around Goal Status [phone]) At least 1 percent but less than 20 percent impaired, limited or restricted       Problem List Patient Active Problem List   Diagnosis Date Noted  . Diplopia 01/29/2016  . GERD (gastroesophageal reflux disease) 01/29/2016  . Left  middle cerebral artery stroke ([place]) 01/29/2016  . Persistent atrial fibrillation ([place])   . Gastrointestinal hemorrhage associated with intestinal diverticulosis   . Gait disturbance, post-stroke 01/27/2016  . Fall   . Secondary hypertension, unspecified   . Cerebral infarction due to thrombosis of left middle cerebral artery (HCC) s/p mechanical thrombectomy   . Malnutrition of moderate degree 01/09/2016  . GI bleed 01/06/2016  . Chronic atrial fibrillation ([place]) 06/10/2015  . Pulmonary hypertension, secondary ([place]) 06/10/2015  . Chronic anticoagulation 06/10/2015  . Hyperlipidemia 06/10/2015  . Essential hypertension 06/10/2015  . History of stroke 06/10/2015  . Coronary artery disease due to lipid rich plaque 06/10/2015  . Afib ([place])   . Elevated uric acid in blood   . Arthritis of ankle   . Pulmonary HTN ([place])     Libra Gatz L 02/10/2016, 11:45 AM  [facility] [address] [place], [place], [address] Phone: [phone]   Fax:  [phone]  Name: Adrienne [name] MRN: [id number] Date of Birth: May 21, 1928   Geoffry Paradise, PT,DPT 02/10/2016 11:45 AM Phone: [phone] Fax: [phone]

## 2016-02-15 ENCOUNTER — Encounter: Payer: Medicare Other | Admitting: Physical Medicine & Rehabilitation

## 2016-02-15 DIAGNOSIS — I63512 Cerebral infarction due to unspecified occlusion or stenosis of left middle cerebral artery: Secondary | ICD-10-CM | POA: Diagnosis not present

## 2016-02-15 DIAGNOSIS — R197 Diarrhea, unspecified: Secondary | ICD-10-CM | POA: Diagnosis not present

## 2016-02-15 DIAGNOSIS — R110 Nausea: Secondary | ICD-10-CM | POA: Diagnosis not present

## 2016-02-16 DIAGNOSIS — R197 Diarrhea, unspecified: Secondary | ICD-10-CM | POA: Diagnosis not present

## 2016-02-17 ENCOUNTER — Ambulatory Visit: Payer: Medicare Other | Attending: Physical Medicine & Rehabilitation | Admitting: Physical Therapy

## 2016-02-17 DIAGNOSIS — M546 Pain in thoracic spine: Secondary | ICD-10-CM | POA: Diagnosis not present

## 2016-02-17 DIAGNOSIS — M6281 Muscle weakness (generalized): Secondary | ICD-10-CM | POA: Diagnosis not present

## 2016-02-17 DIAGNOSIS — R2689 Other abnormalities of gait and mobility: Secondary | ICD-10-CM | POA: Insufficient documentation

## 2016-02-17 DIAGNOSIS — I69351 Hemiplegia and hemiparesis following cerebral infarction affecting right dominant side: Secondary | ICD-10-CM | POA: Diagnosis not present

## 2016-02-17 DIAGNOSIS — R29898 Other symptoms and signs involving the musculoskeletal system: Secondary | ICD-10-CM | POA: Diagnosis not present

## 2016-02-17 DIAGNOSIS — R278 Other lack of coordination: Secondary | ICD-10-CM | POA: Diagnosis not present

## 2016-02-17 NOTE — Therapy (Signed)
[facility] [address] [place], [place], [address] Phone: [phone]   Fax:  [phone]  Physical Therapy Treatment  Patient Details  Name: Adrienne [name] MRN: [id number] Date of Birth: [birth year] Referring Provider: Dr. Letta Pate  Encounter Date: 02/17/2016      PT End of Session - 02/17/16 1516    Visit Number 2   Number of Visits 17   Date for PT Re-Evaluation 04/10/16   Authorization Type G-code and progress note every 10th visit.   PT Start Time 1106   PT Stop Time 1151   PT Time Calculation (min) 45 min   Equipment Utilized During Treatment Gait belt   Activity Tolerance Patient tolerated treatment well   Behavior During Therapy WFL for tasks assessed/performed      Past Medical History  Diagnosis Date  . Afib ([place])   . Elevated uric acid in blood   . Arthritis of ankle   . Pulmonary HTN ([place])   . Long-term (current) use of anticoagulants   . Moderate mitral regurgitation   . CAD (coronary artery disease)   . Edema   . Cancer [facility]) 2012    breast cancer-right  . Hypertension   . Urinary incontinence   . TIA (transient ischemic attack) 09/2011  . Diverticulosis   . Compression fracture of T12 vertebra [facility])     Past Surgical History  Procedure Laterality Date  . Cardiac catheterization  2013  . Replacement total knee bilateral    . Gallbladder surgery    . Appendectomy    . Mastectomy Right   . Cataract extraction, bilateral    . Breast surgery  2012    Rt.mastectomy--[place], [place]  . Tonsillectomy and adenoidectomy      as a child  . Dilation and curettage of uterus      in her early 51's for DUB  . Esophagogastroduodenoscopy Left 01/08/2016    Procedure: ESOPHAGOGASTRODUODENOSCOPY (EGD);  Surgeon: Arta Silence, MD;  Location: [facility];  Service: Endoscopy;  Laterality: Left;  . Radiology with anesthesia N/A 01/25/2016    Procedure: RADIOLOGY WITH ANESTHESIA;  Surgeon: Luanne Bras, MD;  Location: [facility];  Service: Radiology;  Laterality: N/A;    There were no vitals filed for this visit.      Subjective Assessment - 02/17/16 1111    Subjective Pt denies falls or changes since last visit.   Pertinent History A-fib, pulmonary HTN, HTN, hx of R breast CA in 2012, Hx of T12 compression fx (within the last year) but denied precautions, R ankle OA, Hx of GI hemorrhage with diverticulosis, CAD   Patient Stated Goals Walk without any assistance   Currently in Pain? No/denies                         [facility] Adult PT Treatment/Exercise - 02/17/16 0001    Transfers   Transfers Sit to Stand;Stand to Sit   Sit to Stand 5: Supervision;With upper extremity assist;From chair/3-in-1   Stand to Sit 5: Supervision;With upper extremity assist;To chair/3-in-1   Ambulation/Gait   Ambulation/Gait Yes   Ambulation/Gait Assistance 5: Supervision   Ambulation Distance (Feet) 110 Feet  twice and 200' x 1   Assistive device Rolling walker   Gait Pattern Step-through pattern;Decreased stride length;Trendelenburg;Trunk flexed   Ambulation Surface Level;Indoor   Gait Comments Needed seated rest break after 200' due to [place].  SaO2 98% on RA after ambulation and through out treatment  Exercises   Exercises Other Exercises  LLE hip abduction strength 3/5, RLE 4/5   Knee/Hip Exercises: Aerobic   Recumbent Bike Scifit level 1.0 all 4 extremities x 4 1/2 minutes   Knee/Hip Exercises: Standing   Other Standing Knee Exercises Standing at counter for alternating LE hip abd/add, marching, hamstring curl x 10;sidestepping, forward/backward walking, SLS x 10 seconds x 2                PT Education - 02/17/16 1516    Education provided Yes   Education Details HEP   Person(s) Educated Patient   Methods Demonstration;Explanation;Handout   Comprehension Verbalized understanding          PT Short Term Goals - 02/10/16 1138    PT SHORT TERM GOAL #1   Title Pt  will be IND in HEP in order to improve balance and strength. Target date: 03/09/16   Status New   PT SHORT TERM GOAL #2   Title Pt will improve BERG score to >/=47/56 to decr. falls risk. Target date: 03/09/16   Status New   PT SHORT TERM GOAL #3   Title Pt will perform TUG with LRAD in </=13.5sec. to decr. falls risk. Target date: 03/09/16   Status New   PT SHORT TERM GOAL #4   Title Pt will amb. 300' over even terrain with LRAD at MOD I level to improve functional mobilty. Target date: 03/09/16   Status New           PT Long Term Goals - 02/10/16 1140    PT LONG TERM GOAL #1   Title Pt will verbalize understanding of CVA symptoms/risk factors to decr. risk of additonal CVA. Target date: 04/06/16   Status New   PT LONG TERM GOAL #2   Title Pt will improve BERG score to >/=51/56 to decr. falls risk. Target date: 04/06/16   Status New   PT LONG TERM GOAL #3   Title Pt will be able to ascend/descend one step without handrail, with LRAD, at MOD I level to traverse step at home. Target date: 04/06/16   Status New   PT LONG TERM GOAL #4   Title Pt will amb. 500' over even/uneven terrain with LRAD at MOD I level to improve functional mobility. Target date: 04/06/16   Status New   PT LONG TERM GOAL #5   Title Pt will improve gait speed with LRAD to >/=2.7ft/sec. to safely amb. in the community. Target date: 04/06/16               Plan - 02/17/16 1517    Clinical Impression Statement Pt needs frequent rest breaks during session due to dyspnea but recovers quickly.  SaO2 remained >98% during session.  Continue PT per POC.   Rehab Potential Good   Clinical Impairments Affecting Rehab Potential co-morbidities   PT Frequency 2x / week   PT Duration 8 weeks   PT Treatment/Interventions ADLs/Self Care Home Management;Biofeedback;Canalith Repostioning;Vestibular;Manual techniques;Balance training;Therapeutic exercise;Therapeutic activities;Functional mobility training;Stair training;Gait  training;DME Instruction;Patient/family education;Neuromuscular re-education;Orthotic Fit/Training   PT Next Visit Plan Review HEP and revise/add as needed. Try rollator.  Provide pt with CVA ed handout.   Consulted and Agree with Plan of Care Patient      Patient will benefit from skilled therapeutic intervention in order to improve the following deficits and impairments:  Abnormal gait, Decreased endurance, Decreased knowledge of use of DME, Postural dysfunction, Decreased balance, Decreased mobility  Visit Diagnosis: Other abnormalities of gait and mobility  Weakness of both lower extremities     Problem List Patient Active Problem List   Diagnosis Date Noted  . Diplopia 01/29/2016  . GERD (gastroesophageal reflux disease) 01/29/2016  . Left middle cerebral artery stroke ([place]) 01/29/2016  . Persistent atrial fibrillation ([place])   . Gastrointestinal hemorrhage associated with intestinal diverticulosis   . Gait disturbance, post-stroke 01/27/2016  . Fall   . Secondary hypertension, unspecified   . Cerebral infarction due to thrombosis of left middle cerebral artery (HCC) s/p mechanical thrombectomy   . Malnutrition of moderate degree 01/09/2016  . GI bleed 01/06/2016  . Chronic atrial fibrillation ([place]) 06/10/2015  . Pulmonary hypertension, secondary ([place]) 06/10/2015  . Chronic anticoagulation 06/10/2015  . Hyperlipidemia 06/10/2015  . Essential hypertension 06/10/2015  . History of stroke 06/10/2015  . Coronary artery disease due to lipid rich plaque 06/10/2015  . Afib ([place])   . Elevated uric acid in blood   . Arthritis of ankle   . Pulmonary HTN ([place])     Narda Bonds 02/17/2016, 3:20 PM  [facility] [address] [place], [place], [address] Phone: [phone]   Fax:  [phone]  Name: Adrienne [name] MRN: [id number] Date of Birth: [birth year]    Narda Bonds, [place] 02/17/2016 3:20 PM Phone: [phone] Fax: [phone]

## 2016-02-17 NOTE — Patient Instructions (Signed)
ABDUCTION: Standing (Active)    Hold counter for support.  Stand, feet flat. Lift right leg out to side.  Alternate legs doing 10 on each side.Perform once sessions per day.  http://gtsc.exer.us/111   Copyright  VHI. All rights reserved.   "I love a [employer] features editor for support.  March as high as you can alternating legs.  Do 10 times on each leg once a day.   http://gt2.exer.us/345   Copyright  VHI. All rights reserved.   Leg Flexion    Hold counter for support.  Lift one ankle toward buttocks, keeping knees together. Slowly return to starting position. Alternate legs doing 10 times on each leg.  Do once a day.  Copyright  VHI. All rights reserved.  SINGLE LIMB STANCE    Hold counter for support.  Stance: single leg on floor. Raise leg. Hold 10 seconds. Repeat with other leg. Repeat 3 times on each leg once a day.  Copyright  VHI. All rights reserved.   AMBULATION: Side Step    Hold counter for support.  Step sideways. Repeat in opposite direction. Do 3 reps once a day.  Copyright  VHI. All rights reserved.   AMBULATION: Walk Backward    Holding counter for support.  Walk forward along counter then backward.  Repeat 3 times once a day.   Copyright  VHI. All rights reserved.   HIP: Abduction - Side-Lying    Lie on side, legs straight and in line with trunk. Squeeze glutes. Raise top leg up and slightly back. Point toes forward. 10 reps once a day.  Repeat on other side.  Bend bottom leg to stabilize pelvis.  Copyright  VHI. All rights reserved.

## 2016-02-18 ENCOUNTER — Ambulatory Visit: Payer: Medicare Other | Admitting: Rehabilitation

## 2016-02-18 ENCOUNTER — Other Ambulatory Visit (HOSPITAL_COMMUNITY): Payer: Self-pay | Admitting: Interventional Radiology

## 2016-02-18 DIAGNOSIS — I639 Cerebral infarction, unspecified: Secondary | ICD-10-CM

## 2016-02-18 DIAGNOSIS — M8589 Other specified disorders of bone density and structure, multiple sites: Secondary | ICD-10-CM | POA: Diagnosis not present

## 2016-02-18 DIAGNOSIS — M4854XA Collapsed vertebra, not elsewhere classified, thoracic region, initial encounter for fracture: Secondary | ICD-10-CM | POA: Diagnosis not present

## 2016-02-18 DIAGNOSIS — E2839 Other primary ovarian failure: Secondary | ICD-10-CM | POA: Diagnosis not present

## 2016-02-22 ENCOUNTER — Encounter: Payer: Self-pay | Admitting: Rehabilitation

## 2016-02-22 ENCOUNTER — Ambulatory Visit: Payer: Medicare Other | Admitting: Occupational Therapy

## 2016-02-22 ENCOUNTER — Encounter: Payer: Self-pay | Admitting: Occupational Therapy

## 2016-02-22 ENCOUNTER — Ambulatory Visit: Payer: Medicare Other | Admitting: Rehabilitation

## 2016-02-22 ENCOUNTER — Encounter: Payer: Medicare Other | Admitting: Occupational Therapy

## 2016-02-22 DIAGNOSIS — R278 Other lack of coordination: Secondary | ICD-10-CM

## 2016-02-22 DIAGNOSIS — R29898 Other symptoms and signs involving the musculoskeletal system: Secondary | ICD-10-CM

## 2016-02-22 DIAGNOSIS — I69351 Hemiplegia and hemiparesis following cerebral infarction affecting right dominant side: Secondary | ICD-10-CM

## 2016-02-22 DIAGNOSIS — M546 Pain in thoracic spine: Secondary | ICD-10-CM

## 2016-02-22 DIAGNOSIS — R2689 Other abnormalities of gait and mobility: Secondary | ICD-10-CM

## 2016-02-22 DIAGNOSIS — M6281 Muscle weakness (generalized): Secondary | ICD-10-CM

## 2016-02-22 NOTE — Patient Instructions (Addendum)
Stroke Prevention Some health problems and behaviors may make it more likely for you to have a stroke. Below are ways to lessen your risk of having a stroke.   Be active for at least 30 minutes on most or all days.  Do not smoke. Try not to be around others who smoke.  Do not drink too much alcohol.  Do not have more than 2 drinks a day if you are a man.  Do not have more than 1 drink a day if you are a woman and are not pregnant.  Eat healthy foods, such as fruits and vegetables. If you were put on a specific diet, follow the diet as told.  Keep your cholesterol levels under control through diet and medicines. Look for foods that are low in saturated fat, trans fat, cholesterol, and are high in fiber.  If you have diabetes, follow all diet plans and take your medicine as told.  Ask your doctor if you need treatment to lower your blood pressure. If you have high blood pressure (hypertension), follow all diet plans and take your medicine as told by your doctor.  If you are 72-44 years old, have your blood pressure checked every 3-5 years. If you are age 58 or older, have your blood pressure checked every year.  Keep a healthy weight. Eat foods that are low in calories, salt, saturated fat, trans fat, and cholesterol.  Do not take drugs.  Avoid birth control pills, if this applies. Talk to your doctor about the risks of taking birth control pills.  Talk to your doctor if you have sleep problems (sleep apnea).  Take all medicine as told by your doctor.  You may be told to take aspirin or blood thinner medicine. Take this medicine as told by your doctor.  Understand your medicine instructions.  Make sure any other conditions you have are being taken care of. GET HELP RIGHT AWAY IF:  You suddenly lose feeling (you feel numb) or have weakness in your face, arm, or leg.  Your face or eyelid hangs down to one side.  You suddenly feel confused.  You have trouble talking  (aphasia) or understanding what people are saying.  You suddenly have trouble seeing in one or both eyes.  You suddenly have trouble walking.  You are dizzy.  You lose your balance or your movements are clumsy (uncoordinated).  You suddenly have a very bad headache and you do not know the cause.  You have new chest pain.  Your heart feels like it is fluttering or skipping a beat (irregular heartbeat). Do not wait to see if the symptoms above go away. Get help right away. Call your local emergency services (911 in [place] Do not drive yourself to the hospital.   This information is not intended to replace advice given to you by your health care provider. Make sure you discuss any questions you have with your health care provider.   Document Released: 02/28/2012 Document Revised: 09/19/2014 Document Reviewed: 03/01/2013 Elsevier Interactive Patient Education 2016 Coin Together, Arm Motion - Eyes Closed    With eyes closed and feet together, keep arms by your side.  Repeat _3___ times per session for 30 secs each. Do _1__ sessions per day.  Copyright  VHI. All rights reserved.   Feet Apart (Compliant Surface) Arm Motion - Eyes Closed    Stand on compliant surface: ___stacked pillows or cushion_____, feet shoulder width apart. Close eyes and keep arms by your  side.  Repeat _3___ times per session 30 secs each. Do _1___ sessions per day.  Copyright  VHI. All rights reserved.

## 2016-02-22 NOTE — Patient Instructions (Signed)
1. Grip Strengthening (Resistive Putty)   Squeeze putty using thumb and all fingers. Repeat 15 times. Do 1 sessions per day.   Extension (Assistive Putty)   Roll putty back and forth, being sure to use all fingertips. Repeat 3 times. Do 1 sessions per day.  Then pinch as below.   Palmar Pinch Strengthening (Resistive Putty)   Pinch putty between thumb and each fingertip in turn after rolling out    Finger and Thumb Extension (Resistive Putty)   With thumb and all fingers in center of putty donut, stretch out. Repeat 5-10 times. Do 1 sessions per day.      IP Fisting (Resistive Putty)   Keeping knuckles straight, bend fingertips to squeeze putty. Repeat 10 times. Do 1 sessions per day.  MP Flexion (Resistive Putty)   Bending only at large knuckles, press putty down against thumb. Keep fingertips straight. Repeat 10 times. Do 2 sessions per day.

## 2016-02-22 NOTE — Therapy (Signed)
[facility] [address] [place], [place], [address] Phone: [phone]   Fax:  [phone]  Occupational Therapy Treatment  Patient Details  Name: Adrienne [name] MRN: [id number] Date of Birth: 05-Jun-1928 Referring Provider: Dr Letta Pate  Encounter Date: 02/22/2016      OT End of Session - 02/22/16 1635    Activity Tolerance Patient tolerated treatment well   Behavior During Therapy [facility] for tasks assessed/performed      Past Medical History  Diagnosis Date  . Afib ([place])   . Elevated uric acid in blood   . Arthritis of ankle   . Pulmonary HTN ([place])   . Long-term (current) use of anticoagulants   . Moderate mitral regurgitation   . CAD (coronary artery disease)   . Edema   . Cancer [facility]) 2012    breast cancer-right  . Hypertension   . Urinary incontinence   . TIA (transient ischemic attack) 09/2011  . Diverticulosis   . Compression fracture of T12 vertebra [facility])     Past Surgical History  Procedure Laterality Date  . Cardiac catheterization  2013  . Replacement total knee bilateral    . Gallbladder surgery    . Appendectomy    . Mastectomy Right   . Cataract extraction, bilateral    . Breast surgery  2012    Rt.mastectomy--[place]  . Tonsillectomy and adenoidectomy      as a child  . Dilation and curettage of uterus      in her early 45's for DUB  . Esophagogastroduodenoscopy Left 01/08/2016    Procedure: ESOPHAGOGASTRODUODENOSCOPY (EGD);  Surgeon: Arta Silence, MD;  Location[facility];  Service: Endoscopy;  Laterality: Left;  . Radiology with anesthesia N/A 01/25/2016    Procedure: RADIOLOGY WITH ANESTHESIA;  Surgeon: Luanne Bras, MD;  Location: [facility];  Service: Radiology;  Laterality: N/A;    There were no vitals filed for this visit.      Subjective Assessment - 02/22/16 1546    Subjective  I feel like I am doing a little better.   Patient Stated Goals Increased independence  with household chores / daily tasks   Currently in Pain? No/denies   Pain Score 0-No pain                      OT Treatments/Exercises (OP) - 02/22/16 0001    ADLs   Bathing Discussed having husband begin to back out of helping her  into the shower.     ADL Comments Reviewed OT Goals, patient in agreement   Neck Exercises: Machines for Strengthening   UBE (Upper Arm Bike) 4 min level 2, 2 min forward / 2 min backward   Hand Exercises   Theraputty - Flatten Yellow - cueing to isolate fingers from shoulders and elbows    Theraputty - Roll yellow   Theraputty - Grip yellow   Theraputty - Pinch yellow   Theraputty - Locate Pegs 10 pegs left, 10 pegs right   Hand Gripper with Large Beads 35 lb x 25 reps, frequent drops, especially with fatigue.                  OT Education - 02/22/16 1612    Education provided Yes   Education Details Reviewed all OT goals, initiated HEP with yellow resitance putty, and encouraged patient to begin playingpiano to improve coordiantion and stamina in UE's   Person(s) Educated Patient   Methods Explanation;Demonstration   Comprehension Verbalized  understanding;Returned demonstration             OT Long Term Goals - 02/22/16 1640    OT LONG TERM GOAL #1   Title Patient will be independent with home exercise program designed to improve strength in UE's    Status On-going   OT LONG TERM GOAL #2   Title Patient will be modified independent for shower transfers   Status On-going   OT LONG TERM GOAL #3   Title Patient will demonstrate sufficient activity tolerance to complete 25-30 minutes of household work (simple cleaning) without rest break   Status On-going   OT LONG TERM GOAL #4   Title Patient will demonstrate improved strength in Bilateral UE's in gross grasp by 5 lbs to aide in IADL   Status On-going   OT LONG TERM GOAL #5   Title Patient will demonstrate improved stability in right hand for pinch.  Patient with  significant arthritis in [facility] - May benefit from orthoses to increase stability   Status On-going               Plan - 02/22/16 1635    Clinical Impression Statement Patient is in agreement with OT goals, and is eager for improved functional performance with ADL/IADL.     Rehab Potential Excellent   OT Frequency 2x / week   OT Duration 4 weeks   OT Treatment/Interventions Self-care/ADL training;Neuromuscular education;Therapeutic exercise;Energy conservation;DME and/or AE instruction;Therapist, nutritional;Therapeutic activities;Therapeutic exercises;Patient/family education;Balance training   Plan Light strengthening for bilateral shoulders, upper back, conditioning   OT Home Exercise Plan Initiated    Consulted and Agree with Plan of Care Patient      Patient will benefit from skilled therapeutic intervention in order to improve the following deficits and impairments:  Decreased activity tolerance, Decreased balance, Decreased coordination, Decreased endurance, Decreased strength, Impaired UE functional use, Pain  Visit Diagnosis: Hemiplegia and hemiparesis following cerebral infarction affecting right dominant side (HCC)  Muscle weakness (generalized)  Other lack of coordination  Pain in thoracic spine    Problem List Patient Active Problem List   Diagnosis Date Noted  . Diplopia 01/29/2016  . GERD (gastroesophageal reflux disease) 01/29/2016  . Left middle cerebral artery stroke ([place]) 01/29/2016  . Persistent atrial fibrillation ([place])   . Gastrointestinal hemorrhage associated with intestinal diverticulosis   . Gait disturbance, post-stroke 01/27/2016  . Fall   . Secondary hypertension, unspecified   . Cerebral infarction due to thrombosis of left middle cerebral artery (HCC) s/p mechanical thrombectomy   . Malnutrition of moderate degree 01/09/2016  . GI bleed 01/06/2016  . Chronic atrial fibrillation ([place]) 06/10/2015  . Pulmonary hypertension, secondary  ([place]) 06/10/2015  . Chronic anticoagulation 06/10/2015  . Hyperlipidemia 06/10/2015  . Essential hypertension 06/10/2015  . History of stroke 06/10/2015  . Coronary artery disease due to lipid rich plaque 06/10/2015  . Afib ([place])   . Elevated uric acid in blood   . Arthritis of ankle   . Pulmonary HTN ([place])     Antony Salmon, OTR/L 02/22/2016 4:43 PM Phone: [phone] Fax: [phone]  [facility] [address] [place], [place], [address] Phone: [phone]   Fax:  [phone]  Name: Adrienne [name] MRN: [id number] Date of Birth: [birth year]

## 2016-02-22 NOTE — Therapy (Signed)
[facility] [address] [place], [place], [address] Phone: [phone]   Fax:  [phone]  Physical Therapy Treatment  Patient Details  Name: Adrienne [name] MRN: [id number] Date of Birth: Jun 24, 1928 Referring Provider: Dr. Letta Pate  Encounter Date: 02/22/2016      PT End of Session - 02/22/16 1449    Visit Number 3   Number of Visits 17   Date for PT Re-Evaluation 04/10/16   Authorization Type G-code and progress note every 10th visit.   PT Start Time 1445   PT Stop Time 1530   PT Time Calculation (min) 45 min   Equipment Utilized During Treatment Gait belt   Activity Tolerance Patient tolerated treatment well   Behavior During Therapy WFL for tasks assessed/performed      Past Medical History  Diagnosis Date  . Afib ([place])   . Elevated uric acid in blood   . Arthritis of ankle   . Pulmonary HTN ([place])   . Long-term (current) use of anticoagulants   . Moderate mitral regurgitation   . CAD (coronary artery disease)   . Edema   . Cancer [facility]) 2012    breast cancer-right  . Hypertension   . Urinary incontinence   . TIA (transient ischemic attack) 09/2011  . Diverticulosis   . Compression fracture of T12 vertebra [facility])     Past Surgical History  Procedure Laterality Date  . Cardiac catheterization  2013  . Replacement total knee bilateral    . Gallbladder surgery    . Appendectomy    . Mastectomy Right   . Cataract extraction, bilateral    . Breast surgery  2012    Rt.mastectomy--[place], [place]  . Tonsillectomy and adenoidectomy      as a child  . Dilation and curettage of uterus      in her early 2's for DUB  . Esophagogastroduodenoscopy Left 01/08/2016    Procedure: ESOPHAGOGASTRODUODENOSCOPY (EGD);  Surgeon: Arta Silence, MD;  Location: [facility];  Service: Endoscopy;  Laterality: Left;  . Radiology with anesthesia N/A 01/25/2016    Procedure: RADIOLOGY WITH ANESTHESIA;  Surgeon: Luanne Bras, MD;  Location: [place];  Service: Radiology;  Laterality: N/A;    There were no vitals filed for this visit.      Subjective Assessment - 02/22/16 1447    Subjective Pt denies falls or changes since last visit.    Pertinent History A-fib, pulmonary HTN, HTN, hx of R breast CA in 2012, Hx of T12 compression fx (within the last year) but denied precautions, R ankle OA, Hx of GI hemorrhage with diverticulosis, CAD   Patient Stated Goals Walk without any assistance   Currently in Pain? Yes   Pain Score 2    Pain Location Back   Pain Orientation Lower   Pain Descriptors / Indicators Aching;Dull   Pain Type Chronic pain   Pain Onset More than a month ago               Self Care:  Provided pt with handout on CVA warning signs, symptoms, and risk factors, see pt instruction.  Discussed in details and answered pts questions during session.  Also discussed rollator coverage and where to purchase if PT decides this most appropriate device.  Pt verbalized understanding.    Gait:  Performed gait x 2 reps of 115' with rollator at S level with min cues for closeness to RW, upright posture, and safety with brakes and how to sit if needed  when fatigued.  Overall feel that pt safe with rollator but would recommend increased practice before getting for community use.  Also educated on how to manage brakes when sitting/standing with rollator.  Pt returned demonstration.   NMR:  Performed corner balance tasks as follows; feet apart no UE support x 15 secs, feet apart EC x 30 secs, feet together x 30 secs, feet together EC x 30 secs (2 reps), compliant surface with feet apart x 15 secs, EC x 30 secs, feet together compliant EO x 30 secs, EC x 30 secs.  See pt instruction for details on which exercises added to HEP.                   PT Education - 02/22/16 1448    Education provided Yes   Education Details CVA warning signs, symptoms and addition to HEP   Person(s) Educated  Patient   Methods Explanation   Comprehension Verbalized understanding          PT Short Term Goals - 02/10/16 1138    PT SHORT TERM GOAL #1   Title Pt will be IND in HEP in order to improve balance and strength. Target date: 03/09/16   Status New   PT SHORT TERM GOAL #2   Title Pt will improve BERG score to >/=47/56 to decr. falls risk. Target date: 03/09/16   Status New   PT SHORT TERM GOAL #3   Title Pt will perform TUG with LRAD in </=13.5sec. to decr. falls risk. Target date: 03/09/16   Status New   PT SHORT TERM GOAL #4   Title Pt will amb. 300' over even terrain with LRAD at MOD I level to improve functional mobilty. Target date: 03/09/16   Status New           PT Long Term Goals - 02/10/16 1140    PT LONG TERM GOAL #1   Title Pt will verbalize understanding of CVA symptoms/risk factors to decr. risk of additonal CVA. Target date: 04/06/16   Status New   PT LONG TERM GOAL #2   Title Pt will improve BERG score to >/=51/56 to decr. falls risk. Target date: 04/06/16   Status New   PT LONG TERM GOAL #3   Title Pt will be able to ascend/descend one step without handrail, with LRAD, at MOD I level to traverse step at home. Target date: 04/06/16   Status New   PT LONG TERM GOAL #4   Title Pt will amb. 500' over even/uneven terrain with LRAD at MOD I level to improve functional mobility. Target date: 04/06/16   Status New   PT LONG TERM GOAL #5   Title Pt will improve gait speed with LRAD to >/=2.81ft/sec. to safely amb. in the community. Target date: 04/06/16               Plan - 02/22/16 1449    Clinical Impression Statement Skilled session focused on addressing CVA warning signs/risk factors with pt in order to decrease time in seeking medical attention in case of another stroke.  Also addressed gait with use of rollator for energy conservation when walking longer distances and added two exercises to HEP for balance.  Tolerated well with several seated rest breaks due to  fatigue.     Rehab Potential Good   Clinical Impairments Affecting Rehab Potential co-morbidities   PT Frequency 2x / week   PT Duration 8 weeks   PT Treatment/Interventions ADLs/Self Care Home Management;Biofeedback;Canalith Repostioning;Vestibular;Manual  techniques;Balance training;Therapeutic exercise;Therapeutic activities;Functional mobility training;Stair training;Gait training;DME Instruction;Patient/family education;Neuromuscular re-education;Orthotic Fit/Training   PT Next Visit Plan Continue to trial rollator-gait, curb, ramp; advance BLE strengthening as needed, continue balance on compliant surfaces, EO/EC   Consulted and Agree with Plan of Care Patient      Patient will benefit from skilled therapeutic intervention in order to improve the following deficits and impairments:  Abnormal gait, Decreased endurance, Decreased knowledge of use of DME, Postural dysfunction, Decreased balance, Decreased mobility  Visit Diagnosis: Other abnormalities of gait and mobility  Weakness of both lower extremities     Problem List Patient Active Problem List   Diagnosis Date Noted  . Diplopia 01/29/2016  . GERD (gastroesophageal reflux disease) 01/29/2016  . Left middle cerebral artery stroke ([place]) 01/29/2016  . Persistent atrial fibrillation ([place])   . Gastrointestinal hemorrhage associated with intestinal diverticulosis   . Gait disturbance, post-stroke 01/27/2016  . Fall   . Secondary hypertension, unspecified   . Cerebral infarction due to thrombosis of left middle cerebral artery (HCC) s/p mechanical thrombectomy   . Malnutrition of moderate degree 01/09/2016  . GI bleed 01/06/2016  . Chronic atrial fibrillation ([place]) 06/10/2015  . Pulmonary hypertension, secondary ([place]) 06/10/2015  . Chronic anticoagulation 06/10/2015  . Hyperlipidemia 06/10/2015  . Essential hypertension 06/10/2015  . History of stroke 06/10/2015  . Coronary artery disease due to lipid rich plaque 06/10/2015   . Afib ([place])   . Elevated uric acid in blood   . Arthritis of ankle   . Pulmonary HTN ([place])     Cameron Sprang, PT, MPT [facility] [address] [place] , [place], [address] Phone: [phone]   Fax:  [phone] 02/22/2016, 3:54 PM  Name: Adrienne [name] MRN: [id number] Date of Birth: 27-Dec-1927

## 2016-02-24 ENCOUNTER — Ambulatory Visit: Payer: Medicare Other | Admitting: Physical Therapy

## 2016-02-24 ENCOUNTER — Encounter: Payer: Medicare Other | Admitting: Occupational Therapy

## 2016-02-25 DIAGNOSIS — R5383 Other fatigue: Secondary | ICD-10-CM | POA: Diagnosis not present

## 2016-02-25 DIAGNOSIS — K921 Melena: Secondary | ICD-10-CM | POA: Diagnosis not present

## 2016-02-25 DIAGNOSIS — R197 Diarrhea, unspecified: Secondary | ICD-10-CM | POA: Diagnosis not present

## 2016-02-26 ENCOUNTER — Ambulatory Visit: Payer: Medicare Other | Admitting: Rehabilitation

## 2016-02-26 ENCOUNTER — Ambulatory Visit: Payer: Medicare Other | Admitting: Occupational Therapy

## 2016-02-26 DIAGNOSIS — R2689 Other abnormalities of gait and mobility: Secondary | ICD-10-CM

## 2016-02-26 DIAGNOSIS — R29898 Other symptoms and signs involving the musculoskeletal system: Secondary | ICD-10-CM | POA: Diagnosis not present

## 2016-02-26 DIAGNOSIS — I69351 Hemiplegia and hemiparesis following cerebral infarction affecting right dominant side: Secondary | ICD-10-CM

## 2016-02-26 DIAGNOSIS — M6281 Muscle weakness (generalized): Secondary | ICD-10-CM | POA: Diagnosis not present

## 2016-02-26 DIAGNOSIS — R278 Other lack of coordination: Secondary | ICD-10-CM | POA: Diagnosis not present

## 2016-02-26 DIAGNOSIS — M546 Pain in thoracic spine: Secondary | ICD-10-CM | POA: Diagnosis not present

## 2016-02-27 ENCOUNTER — Encounter: Payer: Self-pay | Admitting: Rehabilitation

## 2016-02-27 NOTE — Therapy (Signed)
[facility] [address] [place], [place], [address] Phone: [phone]   Fax:  [phone]  Physical Therapy Treatment  Patient Details  Name: Adrienne [name] MRN: [id number] Date of Birth: 1928/03/06 Referring Provider: Dr. Letta Pate  Encounter Date: 02/26/2016      PT End of Session - 02/26/16 1508    Visit Number 4   Number of Visits 17   Date for PT Re-Evaluation 04/10/16   Authorization Type G-code and progress note every 10th visit.   PT Start Time 1455   PT Stop Time 1540   PT Time Calculation (min) 45 min   Equipment Utilized During Treatment Gait belt   Activity Tolerance Patient tolerated treatment well   Behavior During Therapy WFL for tasks assessed/performed      Past Medical History  Diagnosis Date  . Afib ([place])   . Elevated uric acid in blood   . Arthritis of ankle   . Pulmonary HTN ([place])   . Long-term (current) use of anticoagulants   . Moderate mitral regurgitation   . CAD (coronary artery disease)   . Edema   . Cancer [facility]) 2012    breast cancer-right  . Hypertension   . Urinary incontinence   . TIA (transient ischemic attack) 09/2011  . Diverticulosis   . Compression fracture of T12 vertebra [facility])     Past Surgical History  Procedure Laterality Date  . Cardiac catheterization  2013  . Replacement total knee bilateral    . Gallbladder surgery    . Appendectomy    . Mastectomy Right   . Cataract extraction, bilateral    . Breast surgery  2012    Rt.mastectomy--[place], [place]  . Tonsillectomy and adenoidectomy      as a child  . Dilation and curettage of uterus      in her early 48's for DUB  . Esophagogastroduodenoscopy Left 01/08/2016    Procedure: ESOPHAGOGASTRODUODENOSCOPY (EGD);  Surgeon: Arta Silence, MD;  Location: [facility];  Service: Endoscopy;  Laterality: Left;  . Radiology with anesthesia N/A 01/25/2016    Procedure: RADIOLOGY WITH ANESTHESIA;  Surgeon: Luanne Bras, MD;  Location: [place];  Service: Radiology;  Laterality: N/A;    There were no vitals filed for this visit.      Subjective Assessment - 02/27/16 0933    Subjective Reports no changes since last visit, no falls.    Pertinent History A-fib, pulmonary HTN, HTN, hx of R breast CA in 2012, Hx of T12 compression fx (within the last year) but denied precautions, R ankle OA, Hx of GI hemorrhage with diverticulosis, CAD   Patient Stated Goals Walk without any assistance   Currently in Pain? No/denies         Self Care:  Discussed pts goals as far as ambulation and LRAD.  She verbalized getting comfortable with use of rollator outside during session, however felt that it would be too bulky for use in the house.  Discussed use of SPC for LTG.  She verbalized using cane somewhat on rehab but was not comfortable with this yet.  She did voice wanting to get as good as she could get and that a cane would be easier for her to use.  Following gait with SPC and rollator, discussed improved breath control during gait as PT feels that this is big source of her being SOB following gait and balance tasks.  Pt verbalized understanding.    Gait: Performed gait over indoor and paved outdoor  surfaces as well as up/down curb step with rollator x 350' at S level with cues for upright posture, the possible need to hold handles with brakes if going downhill, and sequencing when negotiating curb step with rollator.  Progressed to trial of gait with SPC over indoor surfaces at min/guard to close S level x 230' during session.  Note she needs min cues for posture and sequencing, but overall she demonstrated good technique and feel that she can progress to [facility].    NMR:  Ended session with high level balance tapping to cones, alternating LEs while on red gym mat x 10 reps with light HHA to no HHA at min/guard to min A level as needed with cues for improving control on RLE.  Progressed to tipping cone over and back to  upright position to increase time spent in SLS.  Again note some difficulty maintaining stance on RLE, but improved with cues and attention to RLE.  Again, cues for pursed lip breathing throughout.                         PT Education - 02/27/16 0933    Education provided Yes   Education Details working towards gait with SPC to increase independence.    Person(s) Educated Patient   Methods Explanation   Comprehension Verbalized understanding          PT Short Term Goals - 02/10/16 1138    PT SHORT TERM GOAL #1   Title Pt will be IND in HEP in order to improve balance and strength. Target date: 03/09/16   Status New   PT SHORT TERM GOAL #2   Title Pt will improve BERG score to >/=47/56 to decr. falls risk. Target date: 03/09/16   Status New   PT SHORT TERM GOAL #3   Title Pt will perform TUG with LRAD in </=13.5sec. to decr. falls risk. Target date: 03/09/16   Status New   PT SHORT TERM GOAL #4   Title Pt will amb. 300' over even terrain with LRAD at MOD I level to improve functional mobilty. Target date: 03/09/16   Status New           PT Long Term Goals - 02/10/16 1140    PT LONG TERM GOAL #1   Title Pt will verbalize understanding of CVA symptoms/risk factors to decr. risk of additonal CVA. Target date: 04/06/16   Status New   PT LONG TERM GOAL #2   Title Pt will improve BERG score to >/=51/56 to decr. falls risk. Target date: 04/06/16   Status New   PT LONG TERM GOAL #3   Title Pt will be able to ascend/descend one step without handrail, with LRAD, at MOD I level to traverse step at home. Target date: 04/06/16   Status New   PT LONG TERM GOAL #4   Title Pt will amb. 500' over even/uneven terrain with LRAD at MOD I level to improve functional mobility. Target date: 04/06/16   Status New   PT LONG TERM GOAL #5   Title Pt will improve gait speed with LRAD to >/=2.46ft/sec. to safely amb. in the community. Target date: 04/06/16               Plan -  02/26/16 1508    Clinical Impression Statement Skilled session focused on gait training with rollator over outdoor surfaces as well as introduction to gait training with SPC.  Pt tolerated well, however noted that  she requires cues for improved breath control during tasks.  Also addressed high level balance during session.     Rehab Potential Good   Clinical Impairments Affecting Rehab Potential co-morbidities   PT Frequency 2x / week   PT Duration 8 weeks   PT Treatment/Interventions ADLs/Self Care Home Management;Biofeedback;Canalith Repostioning;Vestibular;Manual techniques;Balance training;Therapeutic exercise;Therapeutic activities;Functional mobility training;Stair training;Gait training;DME Instruction;Patient/family education;Neuromuscular re-education;Orthotic Fit/Training   PT Next Visit Plan Continue to trial rollator-gait, curb, ramp; advance BLE strengthening as needed, continue balance on compliant surfaces, EO/EC, begin gait with SPC as well (needs cues for breath control)   Consulted and Agree with Plan of Care Patient      Patient will benefit from skilled therapeutic intervention in order to improve the following deficits and impairments:  Abnormal gait, Decreased endurance, Decreased knowledge of use of DME, Postural dysfunction, Decreased balance, Decreased mobility  Visit Diagnosis: Other abnormalities of gait and mobility  Hemiplegia and hemiparesis following cerebral infarction affecting right dominant side (HCC)  Weakness of both lower extremities     Problem List Patient Active Problem List   Diagnosis Date Noted  . Diplopia 01/29/2016  . GERD (gastroesophageal reflux disease) 01/29/2016  . Left middle cerebral artery stroke ([place]) 01/29/2016  . Persistent atrial fibrillation ([place])   . Gastrointestinal hemorrhage associated with intestinal diverticulosis   . Gait disturbance, post-stroke 01/27/2016  . Fall   . Secondary hypertension, unspecified   . Cerebral  infarction due to thrombosis of left middle cerebral artery (HCC) s/p mechanical thrombectomy   . Malnutrition of moderate degree 01/09/2016  . GI bleed 01/06/2016  . Chronic atrial fibrillation ([place]) 06/10/2015  . Pulmonary hypertension, secondary ([place]) 06/10/2015  . Chronic anticoagulation 06/10/2015  . Hyperlipidemia 06/10/2015  . Essential hypertension 06/10/2015  . History of stroke 06/10/2015  . Coronary artery disease due to lipid rich plaque 06/10/2015  . Afib ([place])   . Elevated uric acid in blood   . Arthritis of ankle   . Pulmonary HTN ([place])     Cameron Sprang, PT, MPT [facility] [address] [place], [place], [address] Phone: [phone]   Fax:  [phone] 02/27/2016, 9:38 AM  Name: Brionnah Berstler MRN: [id number] Date of Birth: [birth year]

## 2016-02-29 ENCOUNTER — Encounter: Payer: Self-pay | Admitting: Physical Medicine & Rehabilitation

## 2016-02-29 ENCOUNTER — Encounter: Payer: Self-pay | Admitting: Occupational Therapy

## 2016-02-29 ENCOUNTER — Encounter: Payer: Medicare Other | Attending: Physical Medicine & Rehabilitation

## 2016-02-29 ENCOUNTER — Ambulatory Visit: Payer: Medicare Other | Admitting: Physical Therapy

## 2016-02-29 ENCOUNTER — Ambulatory Visit (HOSPITAL_BASED_OUTPATIENT_CLINIC_OR_DEPARTMENT_OTHER): Payer: Medicare Other | Admitting: Physical Medicine & Rehabilitation

## 2016-02-29 ENCOUNTER — Ambulatory Visit: Payer: Medicare Other | Admitting: Occupational Therapy

## 2016-02-29 VITALS — BP 135/82 | HR 74

## 2016-02-29 VITALS — BP 129/72 | HR 63 | Resp 17

## 2016-02-29 DIAGNOSIS — R29898 Other symptoms and signs involving the musculoskeletal system: Secondary | ICD-10-CM

## 2016-02-29 DIAGNOSIS — R269 Unspecified abnormalities of gait and mobility: Secondary | ICD-10-CM | POA: Insufficient documentation

## 2016-02-29 DIAGNOSIS — I340 Nonrheumatic mitral (valve) insufficiency: Secondary | ICD-10-CM | POA: Insufficient documentation

## 2016-02-29 DIAGNOSIS — Z87891 Personal history of nicotine dependence: Secondary | ICD-10-CM | POA: Diagnosis not present

## 2016-02-29 DIAGNOSIS — R197 Diarrhea, unspecified: Secondary | ICD-10-CM | POA: Diagnosis not present

## 2016-02-29 DIAGNOSIS — I69351 Hemiplegia and hemiparesis following cerebral infarction affecting right dominant side: Secondary | ICD-10-CM

## 2016-02-29 DIAGNOSIS — M6281 Muscle weakness (generalized): Secondary | ICD-10-CM | POA: Diagnosis not present

## 2016-02-29 DIAGNOSIS — R2689 Other abnormalities of gait and mobility: Secondary | ICD-10-CM

## 2016-02-29 DIAGNOSIS — M4854XA Collapsed vertebra, not elsewhere classified, thoracic region, initial encounter for fracture: Secondary | ICD-10-CM | POA: Diagnosis not present

## 2016-02-29 DIAGNOSIS — Z5189 Encounter for other specified aftercare: Secondary | ICD-10-CM | POA: Diagnosis not present

## 2016-02-29 DIAGNOSIS — Z7901 Long term (current) use of anticoagulants: Secondary | ICD-10-CM | POA: Insufficient documentation

## 2016-02-29 DIAGNOSIS — I639 Cerebral infarction, unspecified: Secondary | ICD-10-CM | POA: Diagnosis not present

## 2016-02-29 DIAGNOSIS — R278 Other lack of coordination: Secondary | ICD-10-CM | POA: Diagnosis not present

## 2016-02-29 DIAGNOSIS — I69398 Other sequelae of cerebral infarction: Secondary | ICD-10-CM | POA: Diagnosis not present

## 2016-02-29 DIAGNOSIS — I482 Chronic atrial fibrillation: Secondary | ICD-10-CM | POA: Insufficient documentation

## 2016-02-29 DIAGNOSIS — M546 Pain in thoracic spine: Secondary | ICD-10-CM | POA: Diagnosis not present

## 2016-02-29 DIAGNOSIS — Z853 Personal history of malignant neoplasm of breast: Secondary | ICD-10-CM | POA: Diagnosis not present

## 2016-02-29 NOTE — Therapy (Signed)
[facility] [address] [place], [place], [address] Phone: [phone]   Fax:  [phone]  Physical Therapy Treatment  Patient Details  Name: Adrienne [name] MRN: [id number] Date of Birth: October 31, 1927 Referring Provider: Dr. Letta Pate  Encounter Date: 02/29/2016      PT End of Session - 02/29/16 1538    Visit Number 5   Number of Visits 17   Date for PT Re-Evaluation 04/10/16   Authorization Type G-code and progress note every 10th visit.   PT Start Time 1450   PT Stop Time 1530   PT Time Calculation (min) 40 min   Equipment Utilized During Treatment Gait belt   Activity Tolerance Patient tolerated treatment well   Behavior During Therapy WFL for tasks assessed/performed      Past Medical History  Diagnosis Date  . Afib ([place])   . Elevated uric acid in blood   . Arthritis of ankle   . Pulmonary HTN ([place])   . Long-term (current) use of anticoagulants   . Moderate mitral regurgitation   . CAD (coronary artery disease)   . Edema   . Cancer [facility]) 2012    breast cancer-right  . Hypertension   . Urinary incontinence   . TIA (transient ischemic attack) 09/2011  . Diverticulosis   . Compression fracture of T12 vertebra [facility])     Past Surgical History  Procedure Laterality Date  . Cardiac catheterization  2013  . Replacement total knee bilateral    . Gallbladder surgery    . Appendectomy    . Mastectomy Right   . Cataract extraction, bilateral    . Breast surgery  2012    Rt.mastectomy--[place], [place]  . Tonsillectomy and adenoidectomy      as a child  . Dilation and curettage of uterus      in her early 64's for DUB  . Esophagogastroduodenoscopy Left 01/08/2016    Procedure: ESOPHAGOGASTRODUODENOSCOPY (EGD);  Surgeon: Arta Silence, MD;  Location: [facility];  Service: Endoscopy;  Laterality: Left;  . Radiology with anesthesia N/A 01/25/2016    Procedure: RADIOLOGY WITH ANESTHESIA;  Surgeon: Luanne Bras, MD;  Location: [facility];  Service: Radiology;  Laterality: N/A;    There were no vitals filed for this visit.      Subjective Assessment - 02/29/16 1454    Subjective Reports no changes since last visit, no falls. Performs HEP daily   Pertinent History A-fib, pulmonary HTN, HTN, hx of R breast CA in 2012, Hx of T12 compression fx (within the last year) but denied precautions, R ankle OA, Hx of GI hemorrhage with diverticulosis, CAD   Patient Stated Goals Walk without any assistance   Currently in Pain? Yes   Pain Score 3    Pain Location Hip   Pain Orientation Left   Pain Descriptors / Indicators Aching   Pain Type Chronic pain   Pain Onset More than a month ago   Pain Frequency Constant   Pain Relieving Factors Tylenol                         [facility] Adult PT Treatment/Exercise - 02/29/16 0001    Ambulation/Gait   Ambulation/Gait Yes   Ambulation/Gait Assistance 5: Supervision   Ambulation/Gait Assistance Details working on upright gaze and posture   Ambulation Distance (Feet) 475 Feet   Assistive device Rolling walker   Gait Pattern Step-through pattern;Trendelenburg   Ambulation Surface Level   Gait Comments 115'  with SPC in Left hand, increased time needed to coordinate sequence and min A for balance             Balance Exercises - 02/29/16 1509    Balance Exercises: Standing   SLS Eyes open  multidirectional tapping; min A   Stepping Strategy Anterior;Posterior;Lateral  Min A progressing with wt shifting.   Turning Both  picking up objects from the ground with turn to put them on the mat; Min guard           PT Education - 02/29/16 1517    Education provided Yes   Education Details reviewed technique for breathing and for upright gaze during gait.   Person(s) Educated Patient   Methods Explanation;Handout;Verbal cues;Tactile cues   Comprehension Verbalized understanding          PT Short Term Goals - 02/10/16 1138    PT  SHORT TERM GOAL #1   Title Pt will be IND in HEP in order to improve balance and strength. Target date: 03/09/16   Status New   PT SHORT TERM GOAL #2   Title Pt will improve BERG score to >/=47/56 to decr. falls risk. Target date: 03/09/16   Status New   PT SHORT TERM GOAL #3   Title Pt will perform TUG with LRAD in </=13.5sec. to decr. falls risk. Target date: 03/09/16   Status New   PT SHORT TERM GOAL #4   Title Pt will amb. 300' over even terrain with LRAD at MOD I level to improve functional mobilty. Target date: 03/09/16   Status New           PT Long Term Goals - 02/10/16 1140    PT LONG TERM GOAL #1   Title Pt will verbalize understanding of CVA symptoms/risk factors to decr. risk of additonal CVA. Target date: 04/06/16   Status New   PT LONG TERM GOAL #2   Title Pt will improve BERG score to >/=51/56 to decr. falls risk. Target date: 04/06/16   Status New   PT LONG TERM GOAL #3   Title Pt will be able to ascend/descend one step without handrail, with LRAD, at MOD I level to traverse step at home. Target date: 04/06/16   Status New   PT LONG TERM GOAL #4   Title Pt will amb. 500' over even/uneven terrain with LRAD at MOD I level to improve functional mobility. Target date: 04/06/16   Status New   PT LONG TERM GOAL #5   Title Pt will improve gait speed with LRAD to >/=2.3ft/sec. to safely amb. in the community. Target date: 04/06/16               Plan - 02/29/16 1539    Clinical Impression Statement  Progressed with tolerance to gait distance today.  Requires Min A with SLS and standing balance with narrow BOS without UE support.   Rehab Potential Good   Clinical Impairments Affecting Rehab Potential co-morbidities   PT Frequency 2x / week   PT Duration 8 weeks   PT Treatment/Interventions ADLs/Self Care Home Management;Biofeedback;Canalith Repostioning;Vestibular;Manual techniques;Balance training;Therapeutic exercise;Therapeutic activities;Functional mobility  training;Stair training;Gait training;DME Instruction;Patient/family education;Neuromuscular re-education;Orthotic Fit/Training   PT Next Visit Plan Continue to trial rollator-gait, curb, ramp; advance BLE strengthening as needed, continue balance on compliant surfaces, EO/EC, begin gait with SPC as well (needs cues for breath control)   Consulted and Agree with Plan of Care Patient      Patient will benefit from skilled therapeutic intervention  in order to improve the following deficits and impairments:  Abnormal gait, Decreased endurance, Decreased knowledge of use of DME, Postural dysfunction, Decreased balance, Decreased mobility  Visit Diagnosis: Hemiplegia and hemiparesis following cerebral infarction affecting right dominant side (HCC)  Other abnormalities of gait and mobility  Weakness of both lower extremities  Muscle weakness (generalized)  Other lack of coordination     Problem List Patient Active Problem List   Diagnosis Date Noted  . Diplopia 01/29/2016  . GERD (gastroesophageal reflux disease) 01/29/2016  . Left middle cerebral artery stroke ([place]) 01/29/2016  . Persistent atrial fibrillation ([place])   . Gastrointestinal hemorrhage associated with intestinal diverticulosis   . Gait disturbance, post-stroke 01/27/2016  . Fall   . Secondary hypertension, unspecified   . Cerebral infarction due to thrombosis of left middle cerebral artery (HCC) s/p mechanical thrombectomy   . Malnutrition of moderate degree 01/09/2016  . GI bleed 01/06/2016  . Chronic atrial fibrillation ([place]) 06/10/2015  . Pulmonary hypertension, secondary ([place]) 06/10/2015  . Chronic anticoagulation 06/10/2015  . Hyperlipidemia 06/10/2015  . Essential hypertension 06/10/2015  . History of stroke 06/10/2015  . Coronary artery disease due to lipid rich plaque 06/10/2015  . Afib ([place])   . Elevated uric acid in blood   . Arthritis of ankle   . Pulmonary HTN ([place])     Bjorn Loser 02/29/2016,  3:42 PM  [facility] [address] [place], [place], [address] Phone: [phone]   Fax:  [phone]  Name: Taite Pastula MRN: [id number] Date of Birth: 05/11/1928

## 2016-02-29 NOTE — Patient Instructions (Addendum)
Would recommend Tylenol or acetaminophen for aches and pains.  Would avoid aspirin, ibuprofen, and naproxen and other anti-inflammatories.  If back pain increases please give me a call we can call in some tramadol but we may need to reassess

## 2016-02-29 NOTE — Therapy (Signed)
[facility] [name] [place], [place], [address] Phone: [phone]   Fax:  [phone]  Occupational Therapy Treatment  Patient Details  Name: Adrienne [name] MRN: [id number] Date of Birth: 11-May-1928 Referring Provider: Dr Letta Pate  Encounter Date: 02/29/2016      OT End of Session - 02/29/16 1634    Visit Number 3   Number of Visits 9   Date for OT Re-Evaluation 03/16/16   Authorization Type Medicare - G code/ progress note 10th visit   Authorization - Visit Number 3   Authorization - Number of Visits 10   OT Start Time [id number]   OT Stop Time 1616   OT Time Calculation (min) 45 min   Activity Tolerance Patient tolerated treatment well      Past Medical History  Diagnosis Date  . Afib ([place])   . Elevated uric acid in blood   . Arthritis of ankle   . Pulmonary HTN ([place])   . Long-term (current) use of anticoagulants   . Moderate mitral regurgitation   . CAD (coronary artery disease)   . Edema   . Cancer [facility]) 2012    breast cancer-right  . Hypertension   . Urinary incontinence   . TIA (transient ischemic attack) 09/2011  . Diverticulosis   . Compression fracture of T12 vertebra [facility])     Past Surgical History  Procedure Laterality Date  . Cardiac catheterization  2013  . Replacement total knee bilateral    . Gallbladder surgery    . Appendectomy    . Mastectomy Right   . Cataract extraction, bilateral    . Breast surgery  2012    Rt.mastectomy--[place], [place]  . Tonsillectomy and adenoidectomy      as a child  . Dilation and curettage of uterus      in her early 69's for DUB  . Esophagogastroduodenoscopy Left 01/08/2016    Procedure: ESOPHAGOGASTRODUODENOSCOPY (EGD);  Surgeon: Arta Silence, MD;  Location: [facility];  Service: Endoscopy;  Laterality: Left;  . Radiology with anesthesia N/A 01/25/2016    Procedure: RADIOLOGY WITH ANESTHESIA;  Surgeon: Luanne Bras, MD;  Location: [facility];   Service: Radiology;  Laterality: N/A;    Filed Vitals:   02/29/16 1535  BP: 135/82  Pulse: 74        Subjective Assessment - 02/29/16 1536    Currently in Pain? Yes   Pain Score 3    Pain Location Hip   Pain Descriptors / Indicators Aching   Pain Type Chronic pain   Pain Onset More than a month ago   Pain Frequency Constant   Aggravating Factors  activity   Pain Relieving Factors Tylenol                      OT Treatments/Exercises (OP) - 02/29/16 0001    Exercises   Exercises Hand   Hand Exercises   Theraputty - Locate Pegs 20 pegs in red putty   Other Hand Exercises Began instruction for theraband exercises (yellow).  See pt instruction for details. Pt able to return demonstrate all exercises however needed mod vc's and set up for each activity therefore did not issue as pt needs  more instruction. Pt also stated she had trouble doing all of the theraputty exercises from a time standpoint.              Balance Exercises - 02/29/16 1509    Balance Exercises: Standing   SLS Eyes open  multidirectional tapping; min A   Stepping Strategy Anterior;Posterior;Lateral  Min A progressing with wt shifting.   Turning Both  picking up objects from the ground with turn to put in on th           OT Education - 02/29/16 1612    Education provided Yes   Education Details theraband (yellow) program - did not issue as pt needs more practice and instruction.- see pt instruction for details.    Person(s) Educated Patient   Methods Explanation;Demonstration;Tactile cues;Verbal cues;Handout   Comprehension Verbalized understanding;Returned demonstration;Verbal cues required;Need further instruction             OT Long Term Goals - 02/29/16 1632    OT LONG TERM GOAL #1   Title Patient will be independent with home exercise program designed to improve strength in UE's    Status On-going   OT LONG TERM GOAL #2   Title Patient will be modified independent  for shower transfers   Status On-going   OT LONG TERM GOAL #3   Title Patient will demonstrate sufficient activity tolerance to complete 25-30 minutes of household work (simple cleaning) without rest break   Status On-going   OT LONG TERM GOAL #4   Title Patient will demonstrate improved strength in Bilateral UE's in gross grasp by 5 lbs to aide in IADL   Status On-going   OT LONG TERM GOAL #5   Title Patient will demonstrate improved stability in right hand for pinch.  Patient with significant arthritis in [facility] - May benefit from orthoses to increase stability   Status On-going               Plan - 02/29/16 1632    Clinical Impression Statement Pt progresing toward goals.  Pt reports she was able to do theraputty exercises but not as frequently as requested.    Rehab Potential Excellent   OT Frequency 2x / week   OT Duration 4 weeks   OT Treatment/Interventions Self-care/ADL training;Neuromuscular education;Therapeutic exercise;Energy conservation;DME and/or AE instruction;Therapist, nutritional;Therapeutic activities;Therapeutic exercises;Patient/family education;Balance training   Plan review yellow theraband exercises and issue if possible, activities as ambulatory level to address activity tolerance.   Consulted and Agree with Plan of Care Patient      Patient will benefit from skilled therapeutic intervention in order to improve the following deficits and impairments:  Decreased activity tolerance, Decreased balance, Decreased coordination, Decreased endurance, Decreased strength, Impaired UE functional use, Pain  Visit Diagnosis: Hemiplegia and hemiparesis following cerebral infarction affecting right dominant side (HCC)  Muscle weakness (generalized)  Other lack of coordination    Problem List Patient Active Problem List   Diagnosis Date Noted  . Diplopia 01/29/2016  . GERD (gastroesophageal reflux disease) 01/29/2016  . Left middle cerebral artery stroke  ([place]) 01/29/2016  . Persistent atrial fibrillation ([place])   . Gastrointestinal hemorrhage associated with intestinal diverticulosis   . Gait disturbance, post-stroke 01/27/2016  . Fall   . Secondary hypertension, unspecified   . Cerebral infarction due to thrombosis of left middle cerebral artery (HCC) s/p mechanical thrombectomy   . Malnutrition of moderate degree 01/09/2016  . GI bleed 01/06/2016  . Chronic atrial fibrillation ([place]) 06/10/2015  . Pulmonary hypertension, secondary ([place]) 06/10/2015  . Chronic anticoagulation 06/10/2015  . Hyperlipidemia 06/10/2015  . Essential hypertension 06/10/2015  . History of stroke 06/10/2015  . Coronary artery disease due to lipid rich plaque 06/10/2015  . Afib ([place])   . Elevated uric acid in blood   .  Arthritis of ankle   . Pulmonary HTN ([place])     Quay Burow, OTR/L 02/29/2016, 4:35 PM  [facility] [address] [place], [place], [address] Phone: [phone]   Fax:  [phone]  Name: Eknoor Dial MRN: [id number] Date of Birth: 08-16-1928

## 2016-02-29 NOTE — Patient Instructions (Signed)
When Walking  Look up and find a target  Breathing: Breath in through your nose (smell the roses) and out slowly through your mouth (blow out the candles)

## 2016-02-29 NOTE — Patient Instructions (Signed)

## 2016-02-29 NOTE — Progress Notes (Signed)
Subjective:    Patient ID: Adrienne [name], female    DOB: 04/03/1928, 82 y.o.   MRN: [id number]  HPI 82 year old right-handed female with history of right breast cancer, atrial fibrillation on chronic Coumadin.  Lives with spouse.  The patient relocated recently from [place], [place].  Recently admitted on January 07, 2016, with intermittent bloody stools, upper abdominal pain.  CT showed distal diverticulosis, otherwise unrevealing.  MRI of the spine showed a T12 compression fracture, felt to be old.  INR on admission 2.5, receive vitamin K.  Hemoglobin 13.9, dropped to 9.0.  Underwent upper endoscopy per Gastroenterology that showed small hiatal hernia, a medium non bleeding diverticulum found in the area of the papilla.  The patient's anticoagulation remained on hold.  She was discharged to home on January 10, 2016, plan to follow up outpatient with GI.  Presented on Jan 25, 2016, with right-sided weakness.  Found to be down in the bathroom by her husband.  Her Coumadin had recently been restarted 1 week prior. INR on admission 1.5.  CT showed atrophy, small vessel disease in the periventricular white matter.  Evidence of prior infarct in the lateral left lentiform nucleus.  CT angiogram of the head and neck positive for emergent large vessel occlusion at the left MCA bifurcation, underwent revascularization of occluded left MCA per Interventional Radiology. Remained for a short time on ventilator.  MRI showed left MCA territory infarct.  Echocardiogram with ejection fraction of 65%.  No wall motion abnormalities.  Neurology followup, transitioned to Eliquis on Jan 28, 2016, no plan to resume Coumadin.  DATE OF ADMISSION:  01/29/2016 DATE OF DISCHARGE:  02/05/2016  Pt has followed up with Dr Sabra Heck, still has diarrhea, stool studies reportedly normal GI follow up with CBC at Dr Erlinda Hong office , [facility] On Eliquis no obvious GI bleeding  PT working on balance Has quit  driving years ago Bathes using a shower chair  Dresses indep Sometimes uses no device oin house No  AD prior to CVA   Pain Inventory Average Pain 1 Pain Right Now 1 My pain is dull  In the last 24 hours, has pain interfered with the following? General activity 0 Relation with others 0 Enjoyment of life 0 What TIME of day is your pain at its worst? na Sleep (in general) Good  Pain is worse with: other Pain improves with: NA Relief from Meds: NA  Mobility use a walker ability to climb steps?  no do you drive?  no  Function Do you have any goals in this area?  yes  Neuro/Psych bladder control problems  Prior Studies bone scan  Physicians involved in your care Primary care Kathyrn Lass   Family History  Problem Relation Age of Onset  . Asthma Mother   . CVA Father   . Atrial fibrillation Sister     HAS PACER  . Pneumonia Brother   . Cancer Brother     BREAST CANCER  . Cancer Daughter     COLON CANCER   Social History   Social History  . Marital Status: Married    Spouse Name: N/A  . Number of Children: N/A  . Years of Education: N/A   Social History Main Topics  . Smoking status: Former Smoker    Types: Cigarettes  . Smokeless tobacco: None  . Alcohol Use: No  . Drug Use: No  . Sexual Activity:    Partners: Male    Birth Control/ Protection: Post-menopausal  Comment: IN COLLEGE   Other Topics Concern  . None   Social History Narrative   Past Surgical History  Procedure Laterality Date  . Cardiac catheterization  2013  . Replacement total knee bilateral    . Gallbladder surgery    . Appendectomy    . Mastectomy Right   . Cataract extraction, bilateral    . Breast surgery  2012    Rt.mastectomy--[place], [place]  . Tonsillectomy and adenoidectomy      as a child  . Dilation and curettage of uterus      in her early 55's for DUB  . Esophagogastroduodenoscopy Left 01/08/2016    Procedure: ESOPHAGOGASTRODUODENOSCOPY (EGD);  Surgeon:  Arta Silence, MD;  [facility];  Service: Endoscopy;  Laterality: Left;  . Radiology with anesthesia N/A 01/25/2016    Procedure: RADIOLOGY WITH ANESTHESIA;  Surgeon: Luanne Bras, MD;  Location: [facility];  Service: Radiology;  Laterality: N/A;   Past Medical History  Diagnosis Date  . Afib ([place])   . Elevated uric acid in blood   . Arthritis of ankle   . Pulmonary HTN ([place])   . Long-term (current) use of anticoagulants   . Moderate mitral regurgitation   . CAD (coronary artery disease)   . Edema   . Cancer [facility]) 2012    breast cancer-right  . Hypertension   . Urinary incontinence   . TIA (transient ischemic attack) 09/2011  . Diverticulosis   . Compression fracture of T12 vertebra (HCC)    BP 129/72 mmHg  Pulse 63  Resp 17  SpO2 96%  LMP 09/12/1973 (Approximate)  Opioid Risk Score:   Fall Risk Score:  `1  Depression screen PHQ 2/9  No flowsheet data found.   Review of Systems  Constitutional:       Bladder control problems   Gastrointestinal: Positive for diarrhea.  All other systems reviewed and are negative.      Objective:   Physical Exam  Constitutional: She is oriented to person, place, and time. She appears well-developed and well-nourished.  HENT:  Head: Normocephalic and atraumatic.  Eyes: Conjunctivae and EOM are normal. Pupils are equal, round, and reactive to light.  Visual fields intact to confrontation testing  Pulmonary/Chest: Effort normal. No respiratory distress.  Abdominal: She exhibits no distension.  Neurological: She is alert and oriented to person, place, and time. Gait abnormal.  Neuro:  Eyes without evidence of nystagmus  Tone is normal without evidence of spasticity Cerebellar exam shows no evidence of ataxia on finger nose finger or heel to shin testing No evidence of trunkal ataxia  Motor strength is 5/5 in bilateral deltoid, biceps, triceps, finger flexors and extensors, wrist flexors and extensors, hip flexors, knee  flexors and extensors, ankle dorsiflexors, plantar flexors, invertors and evertors, toe flexors and extensors  Sensory exam is normal to pinprick, proprioception and light touch in the upper and lower limbs   Cranial nerves II- Visual fields are intact to confrontation testing, no blurring of vision III- no evidence of ptosis, upward, downward and medial gaze intact IV- no vertical diplopia or head tilt V- no facial numbness or masseter weakness VI- no pupil abduction weakness VII- no facial droop, good lid closure VII- normal auditory acuity IX- no pharygeal weakness, gag nl X- no pharyngeal weakness, no hoarseness XI- no trap or SCM weakness XII- no glossal weakness  Gait requires walker, decreased dynamic standing balance    Psychiatric: She has a normal mood and affect.  Nursing note and vitals  reviewed.          Assessment & Plan:  1. History of left MCA occlusion, status post revascularization procedure performed by interventional radiology. Patient has had an excellent recovery, still has some dynamic standing balance issues. Very little in terms of upper extremity weakness.  We'll continue outpatient therapy Physical medicine and rehabilitation follow-up on as needed basis.  Follow-up with neurology next month on 04/06/2016, Dr. Erlinda Hong

## 2016-03-02 ENCOUNTER — Ambulatory Visit: Payer: Medicare Other | Admitting: Physical Therapy

## 2016-03-02 ENCOUNTER — Ambulatory Visit: Payer: Medicare Other | Admitting: Occupational Therapy

## 2016-03-02 ENCOUNTER — Encounter: Payer: Self-pay | Admitting: Occupational Therapy

## 2016-03-02 DIAGNOSIS — I69351 Hemiplegia and hemiparesis following cerebral infarction affecting right dominant side: Secondary | ICD-10-CM

## 2016-03-02 DIAGNOSIS — M6281 Muscle weakness (generalized): Secondary | ICD-10-CM

## 2016-03-02 DIAGNOSIS — M546 Pain in thoracic spine: Secondary | ICD-10-CM | POA: Diagnosis not present

## 2016-03-02 DIAGNOSIS — R278 Other lack of coordination: Secondary | ICD-10-CM | POA: Diagnosis not present

## 2016-03-02 DIAGNOSIS — R2689 Other abnormalities of gait and mobility: Secondary | ICD-10-CM | POA: Diagnosis not present

## 2016-03-02 DIAGNOSIS — R29898 Other symptoms and signs involving the musculoskeletal system: Secondary | ICD-10-CM | POA: Diagnosis not present

## 2016-03-02 NOTE — Patient Instructions (Signed)
Yellow resistance band for arms:  Seated in chair:   Shoulders: 1)Wrap band around both hands.  Palms facing downward. Elbows straight. Move arms away from each other. Bring them slowly back toward the center. 10 times rest and repeat.    2)  Wrap band around both hands.   Left hand hold band to right chest.  Right hand punches out at shoulder height.  10 times, rest, repeat Right hand hold band to left chest.  Left hand punches out at shoulder height.  10 times, rest, repeat   Elbows:   Wrap band around both hands leaving small space between hands. 1)  Rest right hand on left chest, press left arm down so elbow straightens - 10 times rest repeat. 2) Rest left hand on right chest, press right arm down so elbow straightens - 10 times, rest, repeat.  1)  Left hand on right thigh and bring right hand upward toward chest by bending your elbow.- 10 times, rest, repeat 2)  Right hand on left thigh, bring left hand upward toward chest by bending your elbow.  10 times, rest, repeat  Complete all exercises 1-2 times daily.

## 2016-03-02 NOTE — Therapy (Signed)
[facility] [address] [place], [place], [address] Phone: [phone]   Fax:  [phone]  Occupational Therapy Treatment  Patient Details  Name: Adrienne [name] MRN: [id number] Date of Birth: [birth year] Referring Provider: Dr Letta Pate  Encounter Date: 03/02/2016      OT End of Session - 03/02/16 1555    Visit Number 4   Number of Visits 9   Date for OT Re-Evaluation 03/16/16   Authorization Type Medicare - G code/ progress note 10th visit   Authorization - Visit Number 4   Authorization - Number of Visits 10   OT Start Time [id number]   OT Stop Time 1531   OT Time Calculation (min) 44 min   Activity Tolerance Patient tolerated treatment well   Behavior During Therapy [facility] for tasks assessed/performed      Past Medical History  Diagnosis Date  . Afib ([place])   . Elevated uric acid in blood   . Arthritis of ankle   . Pulmonary HTN ([place])   . Long-term (current) use of anticoagulants   . Moderate mitral regurgitation   . CAD (coronary artery disease)   . Edema   . Cancer [facility]) 2012    breast cancer-right  . Hypertension   . Urinary incontinence   . TIA (transient ischemic attack) 09/2011  . Diverticulosis   . Compression fracture of T12 vertebra [facility])     Past Surgical History  Procedure Laterality Date  . Cardiac catheterization  2013  . Replacement total knee bilateral    . Gallbladder surgery    . Appendectomy    . Mastectomy Right   . Cataract extraction, bilateral    . Breast surgery  2012    Rt.mastectomy--[place], [place]  . Tonsillectomy and adenoidectomy      as a child  . Dilation and curettage of uterus      in her early 45's for DUB  . Esophagogastroduodenoscopy Left 01/08/2016    Procedure: ESOPHAGOGASTRODUODENOSCOPY (EGD);  Surgeon: Arta Silence, MD;  Location: [facility];  Service: Endoscopy;  Laterality: Left;  . Radiology with anesthesia N/A 01/25/2016    Procedure: RADIOLOGY WITH  ANESTHESIA;  Surgeon: Luanne Bras, MD;  Location: [facility];  Service: Radiology;  Laterality: N/A;    There were no vitals filed for this visit.      Subjective Assessment - 03/02/16 1501    Subjective  I feel like my legs and balance are doing better.  I have really been pulling on that putty.     Patient Stated Goals Increased independence with household chores / daily tasks   Currently in Pain? No/denies   Pain Score 0-No pain                      OT Treatments/Exercises (OP) - 03/02/16 0001    ADLs   Bathing Discussed patient's readiness to shower without shower bench.  Patient has adequate stand balance for the task, and is eager to try with husband prior to next session.  Patient washes feet once she is out of the shower while seated ion commode - longstanding pattern.     Cooking Patient indicates that she is participating in more household tasks, and in reviewing goals, patient feels she is now completing up to 25 minutes of light housekeeping at a time, e.g. dusting, dishes. Patient walking away from walker in kitchen where she has ready access to countertops, although is safely crossing short distances without walker, and without  touching surfaces.  Patient made instant pudding, retrieving items from overhead shelf, from refrigerator, low cupboards without difficulty.     Exercises   Exercises Shoulder;Elbow   Shoulder Exercises: Supine   Horizontal ABduction AROM;Strengthening;Both;Theraband   Theraband Level (Shoulder Horizontal ABduction) Level 1 (Yellow)   Horizontal ABduction Limitations Fatigue at 10 repetitions   Flexion AROM;Strengthening;Both;10 reps;Theraband   Theraband Level (Shoulder Flexion) Level 1 (Yellow)   Elbow Exercises   Theraband Level (Elbow Flexion) Level 1 (Yellow)   Elbow Extension AROM;Strengthening;Both;10 reps   Theraband Level (Elbow Extension) Level 1 (Yellow)   Fine Motor Coordination   In Hand Manipulation Training Attempted  fine motor coordination, function, and strength with and without neoprene CMC splint.  No appreciable difference with orthoses.  Pinch strength same - 5 lbs, regardless.                  OT Education - 03/02/16 1554    Education provided Yes   Education Details theraband yellow for shoulder and elbow resistance exercises   Person(s) Educated Patient   Methods Explanation;Demonstration;Tactile cues;Verbal cues;Handout   Comprehension Verbalized understanding;Returned demonstration;Verbal cues required             OT Long Term Goals - 03/02/16 1559    OT LONG TERM GOAL #3   Title Patient will demonstrate sufficient activity tolerance to complete 25-30 minutes of household work (simple cleaning) without rest break   Status Achieved   OT LONG TERM GOAL #5   Title Patient will demonstrate improved stability in right hand for pinch.  Patient with significant arthritis in [facility] - May benefit from orthoses to increase stability   Status Deferred               Plan - 03/02/16 1556    Clinical Impression Statement Patient pleased with progress thus far, and is increasing activity level at home.  Patient reports decreased appetitie as limiting factor at this time.     Rehab Potential Excellent   OT Frequency 2x / week   OT Duration 4 weeks   OT Treatment/Interventions Self-care/ADL training;Neuromuscular education;Therapeutic exercise;Energy conservation;DME and/or AE instruction;Therapist, nutritional;Therapeutic activities;Therapeutic exercises;Patient/family education;Balance training   Plan Review theraband exercises issued 6/21 - 4 shoulder abduction, flexion, elbow flex/ext;  hand strengthening, UE conditioning   OT Home Exercise Plan theraputty - yellow, theraband - yellow   Consulted and Agree with Plan of Care Patient      Patient will benefit from skilled therapeutic intervention in order to improve the following deficits and impairments:  Decreased activity  tolerance, Decreased balance, Decreased coordination, Decreased endurance, Decreased strength, Impaired UE functional use, Pain  Visit Diagnosis: Hemiplegia and hemiparesis following cerebral infarction affecting right dominant side (HCC)  Muscle weakness (generalized)  Other lack of coordination    Problem List Patient Active Problem List   Diagnosis Date Noted  . Diplopia 01/29/2016  . GERD (gastroesophageal reflux disease) 01/29/2016  . Left middle cerebral artery stroke ([place]) 01/29/2016  . Persistent atrial fibrillation ([place])   . Gastrointestinal hemorrhage associated with intestinal diverticulosis   . Gait disturbance, post-stroke 01/27/2016  . Fall   . Secondary hypertension, unspecified   . Cerebral infarction due to thrombosis of left middle cerebral artery (HCC) s/p mechanical thrombectomy   . Malnutrition of moderate degree 01/09/2016  . GI bleed 01/06/2016  . Chronic atrial fibrillation ([place]) 06/10/2015  . Pulmonary hypertension, secondary ([place]) 06/10/2015  . Chronic anticoagulation 06/10/2015  . Hyperlipidemia 06/10/2015  . Essential hypertension 06/10/2015  .  History of stroke 06/10/2015  . Coronary artery disease due to lipid rich plaque 06/10/2015  . Afib ([place])   . Elevated uric acid in blood   . Arthritis of ankle   . Pulmonary HTN ([place])     Mariah Milling, OTR/L 03/02/2016, 4:00 PM  [facility] [address] [place], [place], [address] Phone: [phone]   Fax:  [phone]  Name: Adoria Klauss MRN: [id number] Date of Birth: July 26, 1928

## 2016-03-07 ENCOUNTER — Ambulatory Visit (HOSPITAL_COMMUNITY)
Admission: RE | Admit: 2016-03-07 | Discharge: 2016-03-07 | Disposition: A | Discharge status: Home / Self Care | Payer: Medicare Other | Source: Ambulatory Visit | Attending: Interventional Radiology | Admitting: Interventional Radiology

## 2016-03-07 DIAGNOSIS — I63512 Cerebral infarction due to unspecified occlusion or stenosis of left middle cerebral artery: Secondary | ICD-10-CM | POA: Diagnosis not present

## 2016-03-07 DIAGNOSIS — I639 Cerebral infarction, unspecified: Secondary | ICD-10-CM

## 2016-03-07 DIAGNOSIS — R350 Frequency of micturition: Secondary | ICD-10-CM | POA: Diagnosis not present

## 2016-03-07 DIAGNOSIS — R110 Nausea: Secondary | ICD-10-CM | POA: Diagnosis not present

## 2016-03-08 ENCOUNTER — Telehealth: Payer: Self-pay | Admitting: Cardiology

## 2016-03-08 DIAGNOSIS — N3000 Acute cystitis without hematuria: Secondary | ICD-10-CM | POA: Diagnosis not present

## 2016-03-08 DIAGNOSIS — R110 Nausea: Secondary | ICD-10-CM | POA: Diagnosis not present

## 2016-03-08 NOTE — Telephone Encounter (Signed)
Left message to call back  

## 2016-03-08 NOTE — Telephone Encounter (Signed)
Pt calling to make September appt-wants appt sooner to discuss if she should continue to take Eliquis and Lipitor -9pt had stroke 02-25-16) may also need a refill-told her I would leave a message to answer the med questions and then  We can go ahead a make fu due to his schedule being booked until August -pt agreed and then when I gave her the date in September she asked again if she couldn't be seen sooner, pls Call pt regarding med and let scheduling know if she needs a sooner appt and where we can put her  [phone]

## 2016-03-09 ENCOUNTER — Ambulatory Visit: Payer: Medicare Other | Admitting: Physical Therapy

## 2016-03-09 ENCOUNTER — Ambulatory Visit: Payer: Medicare Other | Admitting: Occupational Therapy

## 2016-03-09 MED ORDER — ATORVASTATIN CALCIUM 20 MG PO TABS: 20.0000 mg | ORAL_TABLET | Freq: Every day | ORAL | Status: DC

## 2016-03-09 NOTE — Telephone Encounter (Signed)
Moved appt up for pt to be seen tomorrow to evaluate her fatigue.

## 2016-03-09 NOTE — Telephone Encounter (Signed)
New Message:    He said he was supposed to call and give name of pt's medication to the nurse.

## 2016-03-09 NOTE — Telephone Encounter (Signed)
Spoke with pt and husband. Pt was admitted to the hospital with stroke on the end on May 2017. Pt was D/C on 5/26 from [facility] [place]. On discharge pt was recommended to make an appointment to see Dr. Marlou Porch in September. Pt would like to be seen sooner. An appointment was made for August 3 rd at 3:30 pm. Pt was placed on Eliquis and Lipitor medication in the hospital. Pt wants to have a prescription send to pt's pharmacy for Lipitor 20 mg once daily, because she won't have enough of this  medication until her appointment with Dr. Marlou Porch. prescription was send to pt's pharmacy.   Pt states her BP has been running low BP today is 110/50. Pt  feels tired all the time. Pt's BP on her last office visit with Dr. Marlou Porch on 12/14/15 was 124/80.

## 2016-03-10 ENCOUNTER — Encounter: Payer: Self-pay | Admitting: Cardiology

## 2016-03-10 ENCOUNTER — Ambulatory Visit (INDEPENDENT_AMBULATORY_CARE_PROVIDER_SITE_OTHER): Payer: Medicare Other | Admitting: Cardiology

## 2016-03-10 VITALS — BP 124/62 | HR 68 | Ht 62.0 in | Wt 145.1 lb

## 2016-03-10 DIAGNOSIS — IMO0002 Reserved for concepts with insufficient information to code with codable children: Secondary | ICD-10-CM

## 2016-03-10 DIAGNOSIS — I2510 Atherosclerotic heart disease of native coronary artery without angina pectoris: Secondary | ICD-10-CM | POA: Diagnosis not present

## 2016-03-10 DIAGNOSIS — I272 Other secondary pulmonary hypertension: Secondary | ICD-10-CM

## 2016-03-10 DIAGNOSIS — I2583 Coronary atherosclerosis due to lipid rich plaque: Secondary | ICD-10-CM

## 2016-03-10 DIAGNOSIS — K30 Functional dyspepsia: Secondary | ICD-10-CM

## 2016-03-10 DIAGNOSIS — I639 Cerebral infarction, unspecified: Secondary | ICD-10-CM

## 2016-03-10 DIAGNOSIS — R109 Unspecified abdominal pain: Secondary | ICD-10-CM

## 2016-03-10 DIAGNOSIS — I482 Chronic atrial fibrillation, unspecified: Secondary | ICD-10-CM

## 2016-03-10 DIAGNOSIS — Z7901 Long term (current) use of anticoagulants: Secondary | ICD-10-CM | POA: Diagnosis not present

## 2016-03-10 DIAGNOSIS — Z8673 Personal history of transient ischemic attack (TIA), and cerebral infarction without residual deficits: Secondary | ICD-10-CM

## 2016-03-10 NOTE — Patient Instructions (Signed)
Medication Instructions:  Please stop your Lipitor. Continue all other medications as listed.  Follow-Up: Follow up in 6 months with Dr. Marlou Porch.  You will receive a letter in the mail 2 months before you are due.  Please call us when you receive this letter to schedule your follow up appointment.  If you need a refill on your cardiac medications before your next appointment, please call your pharmacy.  Thank you for choosing [facility]!!

## 2016-03-10 NOTE — Progress Notes (Signed)
Cardiology Office Note   Date:  03/10/2016   ID:  Adrienne [name], DOB 01/18/1928, MRN [id number]  PCP:  Tawanna Solo, MD  Cardiologist:   Candee Furbish, MD       History of Present Illness: Adrienne [name] is a 82 y.o. female (I see her husband, she is new from [place]) here for follow up of atrial fibrillation, pulmonary hypertension, coronary artery disease, mitral regurgitation on chronic anticoagulation patient of Dr. Kathyrn Lass.  Admitted in April with intermittent bloody stools. Anticoagulation was placed on hold. GI work up unremarkable (Hiatal hernia and diverticulosis). On Jan 25, 2016 had right side weakness and was found in bathroom by husband. Coumadin was restarted about a week prior to this, INR 1.5.Left MCA infarct. Interventional radiology. Transitioned to Eliquis on May 18.   She called [place] on 03/08/16 wanting to know if she should continue Eliquis and Lipitor. Feels tired all of the time. Asked to come in sooner. BP 110/50. She wonders if the Lipitor is making her symptoms worse, she cannot eat, chronic nausea. Note, she was having some stomach pains prior to her use of atorvastatin.  Still feels poor, diarrhea (studies normal). Working on balance.   Atrial fibrillation: In the past she has tried amiodarone but now has this listed under allergies. Currently under rate control strategy. Taking diltiazem, metoprolol. Had minor stroke years ago on Zometa in 2013 breast cancer. Right breast.   Coronary artery disease: Detected on catheterization in February 2013, nonobstructive. Echo on 09/2014 showed moderate mitral regurgitation.  Hyperlipidemia-assume that she has had difficulty with statins in the past given her current use of red yeast rice.  She has had prior visits with Dr. Sabra Heck for dizziness. Vertigo-like symptoms. During that visit she was concerned about cardiology referral for management of A. fib and Coumadin.  Pulmonary hypertension: been steady for  years. In fact last echocardiogram reassuring with no significant elevation pressures.   She states that she has been having some fevers, chills, has seen Dr. Kathyrn Lass and is been currently being treated.       Past Medical History  Diagnosis Date  . Afib ([place])   . Elevated uric acid in blood   . Arthritis of ankle   . Pulmonary HTN ([place])   . Long-term (current) use of anticoagulants   . Moderate mitral regurgitation   . CAD (coronary artery disease)   . Edema   . Cancer [facility]) 2012    breast cancer-right  . Hypertension   . Urinary incontinence   . TIA (transient ischemic attack) 09/2011  . Diverticulosis   . Compression fracture of T12 vertebra [facility])     Past Surgical History  Procedure Laterality Date  . Cardiac catheterization  2013  . Replacement total knee bilateral    . Gallbladder surgery    . Appendectomy    . Mastectomy Right   . Cataract extraction, bilateral    . Breast surgery  2012    Rt.mastectomy--[place], [place]  . Tonsillectomy and adenoidectomy      as a child  . Dilation and curettage of uterus      in her early 75's for DUB  . Esophagogastroduodenoscopy Left 01/08/2016    Procedure: ESOPHAGOGASTRODUODENOSCOPY (EGD);  Surgeon: Arta Silence, MD;  Location: [facility];  Service: Endoscopy;  Laterality: Left;  . Radiology with anesthesia N/A 01/25/2016    Procedure: RADIOLOGY WITH ANESTHESIA;  Surgeon: Luanne Bras, MD;  Location: [facility];  Service: Radiology;  Laterality: N/A;  Current Outpatient Prescriptions  Medication Sig Dispense Refill  . acetaminophen (TYLENOL) 500 MG tablet Take 500-1,000 mg by mouth every 12 (twelve) hours as needed (pain).     Marland Kitchen anastrozole (ARIMIDEX) 1 MG tablet Take 1 tablet (1 mg total) by mouth daily. 30 tablet 0  . apixaban (ELIQUIS) 5 MG TABS tablet Take 1 tablet (5 mg total) by mouth 2 (two) times daily. 60 tablet 1  . CALCIUM PO Take 1 tablet by mouth daily at 3 pm.    . Cholecalciferol (VITAMIN D PO)  Take 1 tablet by mouth daily.     Marland Kitchen diltiazem (CARDIZEM) 60 MG tablet Take 1 tablet (60 mg total) by mouth 3 (three) times daily. 270 tablet 3  . metoprolol (LOPRESSOR) 100 MG tablet Take 1 tablet (100 mg total) by mouth 2 (two) times daily. 180 tablet 3  . omeprazole (PRILOSEC) 40 MG capsule Take 40 mg by mouth daily.     Marland Kitchen trimethoprim (TRIMPEX) 100 MG tablet Take 100 mg by mouth daily. Reported on 02/10/2016  11  . CARAFATE 1 GM/10ML suspension Take 10 mLs by mouth 2 (two) times daily.    . cefUROXime (CEFTIN) 250 MG tablet Take 250 mg by mouth 2 (two) times daily.     No current facility-administered medications for this visit.    Allergies:   Amiodarone hcl; Compazine; Other; Thorazine; Valium; and Zometa    Social History:  The patient  reports that she has quit smoking. Her smoking use included Cigarettes. She does not have any smokeless tobacco history on file. She reports that she does not drink alcohol or use illicit drugs.   Family History:  The patient's family history includes Asthma in her mother; Atrial fibrillation in her sister; CVA in her father; Cancer in her brother and daughter; Pneumonia in her brother.    ROS:  Please see the history of present illness.   Otherwise, review of systems are positive for no bleeding, no syncope, no orthopnea, no PND, no chest pain, no new strokelike symptoms.   All other systems are reviewed and negative.    PHYSICAL EXAM: VS:  BP 124/62 mmHg  Pulse 68  Ht 5\' 2"  (1.575 m)  Wt 145 lb 1.9 oz (65.826 kg)  BMI 26.54 kg/m2  LMP 09/12/1973 (Approximate) , BMI Body mass index is 26.54 kg/(m^2). GEN: Well nourished, well developed, in no acute distress HEENT: normal Neck: no JVD, carotid bruits, or masses Cardiac: irregularly irregular;  soft systolic murmur left lower sternal border no  rubs, or gallops  Respiratory:  clear to auscultation bilaterally, normal work of breathing GI: soft, nontender, nondistended, + BS MS: no deformity or  atrophy Skin: warm and dry, no rash, 2-3+ chronic lower extremity edema left greater than right with chronic venous insufficiency changes. Neuro:  Strength and sensation are intact Psych: euthymic mood, full affect   EKG:  EKG 06/10/15-atrial fibrillation heart rate 64, no other abnormalities. Personally viewed-prior Prior EKG from 10/29/11 shows atrial fibrillation with heart rate 110 bpm, no other abnormalities.  ECHO: 06/18/15  - Left ventricle: The cavity size was mildly dilated. Systolic  function was normal. The estimated ejection fraction was in the  range of 55% to 60%. Wall motion was normal; there were no  regional wall motion abnormalities. - Aortic valve: Moderately calcified annulus. Trileaflet; normal  thickness, mildly calcified leaflets. Mobility was not  restricted. - Mitral valve: Calcified annulus. - Right ventricle: The cavity size was normal. Wall thickness was  normal.  Systolic function was normal. - Tricuspid valve: There was mild regurgitation. - Pulmonic valve: Transvalvular velocity was within the normal  range. There was no evidence for stenosis. - Inferior vena cava: The vessel was dilated. The respirophasic  diameter changes were in the normal range (>= 50%), consistent  with elevated central venous pressure.  Recent Labs: 01/26/2016: Magnesium 1.9 02/01/2016: ALT 12*; BUN 10; Creatinine, Ser 0.71; Potassium 4.0; Sodium 137 02/03/2016: Hemoglobin 8.9*; Platelets 349    Lipid Panel    Component Value Date/Time   CHOL 143 01/26/2016 0300   TRIG 101 01/26/2016 0300   HDL 43 01/26/2016 0300   CHOLHDL 3.3 01/26/2016 0300   VLDL 20 01/26/2016 0300   LDLCALC 80 01/26/2016 0300      Wt Readings from Last 3 Encounters:  03/10/16 145 lb 1.9 oz (65.826 kg)  01/25/16 146 lb 2.6 oz (66.3 kg)  01/06/16 149 lb 4 oz (67.7 kg)      Other studies Reviewed: Additional studies/ records that were reviewed today include: office notes, labs, EKG, echoew  of the above records demonstrates: as above   ASSESSMENT AND PLAN:  1. Persistent atrial fibrillation -Since 2013. Failed amiodarone. -This patients CHA2DS2-VASc Score and unadjusted Ischemic Stroke Rate (% per year) is equal to 9.7 % stroke rate/year from a score of 6  Above score calculated as 1 point each if present [CHF, HTN, DM, Vascular=MI/PAD/Aortic Plaque, Age if 65-74, or Female] Above score calculated as 2 points each if present [Age > 75, or Stroke/TIA/TE]  -Continuing with chronic anti-coagulation. Eliquis now. Off coumadin. Stroke (INR 1.5).  -Under good rate control with heart rates between 60 and 90 mostly. Continue with metoprolol and diltiazem. She takes split dosing of this medication. Discussed Eliquis. -She had a stroke, left MCA when she had to be off of anticoagulation secondary to gastrointestinal bleeding.   2. Coronary artery disease -Nonobstructive CAD, she believes normal cardiac catheterization in 2013 in [place].  3. Pulmonary hypertension -Possibly secondary pulmonary hypertension from left-sided heart disease previously.  She states that her pulmonary pressures have been stable. In fact, last echocardiogram on 06/18/15 was reassuring , normal values.  4. Hyperlipidemia -Excellent control previously with red yeast rice extract. LDL 48.   - Now feels tired on Lipitor. More nausea, more stomach issues  - I asked her to discontinue her atorvastatin. She understands the risks versus benefits of statin use in the setting of stroke. This was an embolic stroke so hopefully her risk will not be significantly increased. Her nausea, difficult to eating is causing significant lifestyle modification.  5.Chronic anticoagulation  -previously Dr. Kathyrn Lass checked INR levels on a monthly basis.  - now on Eliquis   6. History of breast cancer -Prior mastectomy right-sided.  7. History of stroke -During breast cancer treatment after receiving Zometa as well as  during a period of cessation of coumadin in the setting of GI bleeding. She is on chronic anticoagulation, now Eliquis. Marland Kitchen  8. Chronic lower extremity edema/venous insufficiency -Prior Lasix,  Now off with 3 gout attacks.  Encouraged thigh-high stockings. She does not like to wear these in the past. Stable chronic condition.  Current medicines are reviewed at length with the patient today.  The patient does not have concerns regarding medicines.  The following changes have been made:  no change  Labs/ tests ordered today include:   No orders of the defined types were placed in this encounter.     Disposition:   FU with  [place] in 6 months  Signed, Candee Furbish, MD  03/10/2016 8:50 AM    [facility] [place], [place],   [address] Phone: [phone]; Fax: [phone]

## 2016-03-11 ENCOUNTER — Ambulatory Visit: Payer: Medicare Other | Admitting: Occupational Therapy

## 2016-03-11 ENCOUNTER — Encounter: Payer: Self-pay | Admitting: Rehabilitation

## 2016-03-11 ENCOUNTER — Ambulatory Visit: Payer: Medicare Other | Admitting: Rehabilitation

## 2016-03-11 DIAGNOSIS — R278 Other lack of coordination: Secondary | ICD-10-CM | POA: Diagnosis not present

## 2016-03-11 DIAGNOSIS — R2689 Other abnormalities of gait and mobility: Secondary | ICD-10-CM

## 2016-03-11 DIAGNOSIS — M6281 Muscle weakness (generalized): Secondary | ICD-10-CM

## 2016-03-11 DIAGNOSIS — M546 Pain in thoracic spine: Secondary | ICD-10-CM | POA: Diagnosis not present

## 2016-03-11 DIAGNOSIS — I69351 Hemiplegia and hemiparesis following cerebral infarction affecting right dominant side: Secondary | ICD-10-CM | POA: Diagnosis not present

## 2016-03-11 DIAGNOSIS — R29898 Other symptoms and signs involving the musculoskeletal system: Secondary | ICD-10-CM | POA: Diagnosis not present

## 2016-03-11 NOTE — Therapy (Signed)
[facility] [address] [place], [place], [address] Phone: [phone]   Fax:  [phone]  Physical Therapy Treatment  Patient Details  Name: Adrienne [name] MRN: [id number] Date of Birth: December 26, 1927 Referring Provider: Dr. Letta Pate  Encounter Date: 03/11/2016      PT End of Session - 03/11/16 1110    Visit Number 6   Number of Visits 17   Date for PT Re-Evaluation 04/10/16   Authorization Type G-code and progress note every 10th visit.   PT Start Time 1105   PT Stop Time 1147   PT Time Calculation (min) 42 min   Equipment Utilized During Treatment Gait belt   Activity Tolerance Patient tolerated treatment well   Behavior During Therapy WFL for tasks assessed/performed      Past Medical History  Diagnosis Date  . Afib ([place])   . Elevated uric acid in blood   . Arthritis of ankle   . Pulmonary HTN ([place])   . Long-term (current) use of anticoagulants   . Moderate mitral regurgitation   . CAD (coronary artery disease)   . Edema   . Cancer [facility]) 2012    breast cancer-right  . Hypertension   . Urinary incontinence   . TIA (transient ischemic attack) 09/2011  . Diverticulosis   . Compression fracture of T12 vertebra [facility])     Past Surgical History  Procedure Laterality Date  . Cardiac catheterization  2013  . Replacement total knee bilateral    . Gallbladder surgery    . Appendectomy    . Mastectomy Right   . Cataract extraction, bilateral    . Breast surgery  2012    Rt.mastectomy--[place]  . Tonsillectomy and adenoidectomy      as a child  . Dilation and curettage of uterus      in her early 79's for DUB  . Esophagogastroduodenoscopy Left 01/08/2016    Procedure: ESOPHAGOGASTRODUODENOSCOPY (EGD);  Surgeon: Arta Silence, MD;  Location: [facility];  Service: Endoscopy;  Laterality: Left;  . Radiology with anesthesia N/A 01/25/2016    Procedure: RADIOLOGY WITH ANESTHESIA;  Surgeon: Luanne Bras, MD;  Location: [facility];  Service: Radiology;  Laterality: N/A;    There were no vitals filed for this visit.      Subjective Assessment - 03/11/16 1109    Subjective Reports having UTI and fever this week, but is starting to feel better.    Pertinent History A-fib, pulmonary HTN, HTN, hx of R breast CA in 2012, Hx of T12 compression fx (within the last year) but denied precautions, R ankle OA, Hx of GI hemorrhage with diverticulosis, CAD   Patient Stated Goals Walk without any assistance   Currently in Pain? No/denies                         [facility] Adult PT Treatment/Exercise - 03/11/16 1119    Berg Balance Test   Sit to Stand Able to stand without using hands and stabilize independently   Standing Unsupported Able to stand safely 2 minutes   Sitting with Back Unsupported but Feet Supported on Floor or Stool Able to sit safely and securely 2 minutes   Stand to Sit Sits safely with minimal use of hands   Transfers Able to transfer safely, minor use of hands   Standing Unsupported with Eyes Closed Able to stand 10 seconds safely   Standing Ubsupported with Feet Together Able to place feet together independently  and stand 1 minute safely   From Standing, Reach Forward with Outstretched Arm Can reach confidently >25 cm (10")   From Standing Position, Pick up Object from [place] to pick up shoe safely and easily   From Standing Position, Turn to Look Behind Over each Shoulder Turn sideways only but maintains balance   Turn 360 Degrees Able to turn 360 degrees safely but slowly   Standing Unsupported, Alternately Place Feet on Step/Stool Able to complete 4 steps without aid or supervision   Standing Unsupported, One Foot in Front Able to plae foot ahead of the other independently and hold 30 seconds   Standing on One Leg Tries to lift leg/unable to hold 3 seconds but remains standing independently   Total Score 46       NMR:  Performed BERG balance test with  score of 46/56, just 1 point below goal level.  Discussed results with pt.  Also addressed current HEP for corner balance tasks.   Note that they are becoming easier and PT discussed increasing difficulty during next visit.  Pt verbalized understanding.  Performed TUG with score of 12.85 seconds, meeting this goal.    Gait:  Continued to address gait with SPC during session over varying indoor surfaces as well as negotiation of curb and ramp.  Requires min/guard for curb and ramp, otherwise is S level for indoor surfaces.           PT Education - 03/11/16 1110    Education provided Yes   Education Details education on gait with SPC in house with S from husband   Person(s) Educated Patient   Methods Explanation   Comprehension Verbalized understanding          PT Short Term Goals - 03/11/16 1127    PT SHORT TERM GOAL #1   Title Pt will be IND in HEP in order to improve balance and strength. Target date: 03/09/16   Baseline met    Status Achieved   PT SHORT TERM GOAL #2   Title Pt will improve BERG score to >/=47/56 to decr. falls risk. Target date: 03/09/16   Baseline 46/56 on 03/11/16    Status Partially Met   PT SHORT TERM GOAL #3   Title Pt will perform TUG with LRAD in </=13.5sec. to decr. falls risk. Target date: 03/09/16   Baseline 12.85 secs with RW   Status Achieved   PT SHORT TERM GOAL #4   Title Pt will amb. 300' over even terrain with LRAD at MOD I level to improve functional mobilty. Target date: 03/09/16   Baseline mod I with RW, S with cane (provided education to begin gait with SPC at home with S from husband)   Status Achieved           PT Long Term Goals - 02/10/16 1140    PT LONG TERM GOAL #1   Title Pt will verbalize understanding of CVA symptoms/risk factors to decr. risk of additonal CVA. Target date: 04/06/16   Status New   PT LONG TERM GOAL #2   Title Pt will improve BERG score to >/=51/56 to decr. falls risk. Target date: 04/06/16   Status New   PT  LONG TERM GOAL #3   Title Pt will be able to ascend/descend one step without handrail, with LRAD, at MOD I level to traverse step at home. Target date: 04/06/16   Status New   PT LONG TERM GOAL #4   Title Pt will amb. 500' over  even/uneven terrain with LRAD at MOD I level to improve functional mobility. Target date: 04/06/16   Status New   PT LONG TERM GOAL #5   Title Pt will improve gait speed with LRAD to >/=2.23f/sec. to safely amb. in the community. Target date: 04/06/16               Plan - 03/11/16 1110    Clinical Impression Statement Skilled session focused on gait training with use of SPC, including negotiation of curb, ramp.  Also addressed STGs.  Pt has met 3/4 STG's and partially met 4th goal for BERG balance test.     Rehab Potential Good   Clinical Impairments Affecting Rehab Potential co-morbidities   PT Frequency 2x / week   PT Duration 8 weeks   PT Treatment/Interventions ADLs/Self Care Home Management;Biofeedback;Canalith Repostioning;Vestibular;Manual techniques;Balance training;Therapeutic exercise;Therapeutic activities;Functional mobility training;Stair training;Gait training;DME Instruction;Patient/family education;Neuromuscular re-education;Orthotic Fit/Training   PT Next Visit Plan advance corner balance tasks,  advance BLE strengthening as needed, continue balance on compliant surfaces, EO/EC, begin gait with SPC as well (needs cues for breath control)   Consulted and Agree with Plan of Care Patient      Patient will benefit from skilled therapeutic intervention in order to improve the following deficits and impairments:  Abnormal gait, Decreased endurance, Decreased knowledge of use of DME, Postural dysfunction, Decreased balance, Decreased mobility  Visit Diagnosis: Muscle weakness (generalized)  Other abnormalities of gait and mobility     Problem List Patient Active Problem List   Diagnosis Date Noted  . Diplopia 01/29/2016  . GERD  (gastroesophageal reflux disease) 01/29/2016  . Left middle cerebral artery stroke ([place] 01/29/2016  . Persistent atrial fibrillation ([facility]   . Gastrointestinal hemorrhage associated with intestinal diverticulosis   . Gait disturbance, post-stroke 01/27/2016  . Fall   . Secondary hypertension, unspecified   . Cerebral infarction due to thrombosis of left middle cerebral artery (HCC) s/p mechanical thrombectomy   . Malnutrition of moderate degree 01/09/2016  . GI bleed 01/06/2016  . Chronic atrial fibrillation ([place] 06/10/2015  . Pulmonary hypertension, secondary ([place] 06/10/2015  . Chronic anticoagulation 06/10/2015  . Hyperlipidemia 06/10/2015  . Essential hypertension 06/10/2015  . History of stroke 06/10/2015  . Coronary artery disease due to lipid rich plaque 06/10/2015  . Afib ([place]   . Elevated uric acid in blood   . Arthritis of ankle   . Pulmonary HTN ([place]     ECameron Sprang PT, MPT [facility]SSt[address] [place] [address]Phone: [phone]  Fax:  [phone]/30/2017, 4:30 PM  Name: BTalecia SherlinMRN: [id number] of Birth: 416-Apr-1929

## 2016-03-11 NOTE — Therapy (Signed)
[facility] [address] [place], [place], [address] Phone: [phone]   Fax:  [phone]  Occupational Therapy Treatment  Patient Details  Name: Adrienne [name] MRN: [id number] Date of Birth: 1928/05/08 Referring Provider: Dr Letta Pate  Encounter Date: 03/11/2016      OT End of Session - 03/11/16 1214    Visit Number 5   Number of Visits 9   Date for OT Re-Evaluation 03/16/16   Authorization Type Medicare - G code/ progress note 10th visit   Authorization - Visit Number 5   Authorization - Number of Visits 10   OT Start Time 1150   OT Stop Time 1230   OT Time Calculation (min) 40 min   Activity Tolerance Patient tolerated treatment well   Behavior During Therapy [facility] for tasks assessed/performed      Past Medical History  Diagnosis Date  . Afib ([place])   . Elevated uric acid in blood   . Arthritis of ankle   . Pulmonary HTN ([place])   . Long-term (current) use of anticoagulants   . Moderate mitral regurgitation   . CAD (coronary artery disease)   . Edema   . Cancer [facility]) 2012    breast cancer-right  . Hypertension   . Urinary incontinence   . TIA (transient ischemic attack) 09/2011  . Diverticulosis   . Compression fracture of T12 vertebra [facility])     Past Surgical History  Procedure Laterality Date  . Cardiac catheterization  2013  . Replacement total knee bilateral    . Gallbladder surgery    . Appendectomy    . Mastectomy Right   . Cataract extraction, bilateral    . Breast surgery  2012    Rt.mastectomy--[place]  . Tonsillectomy and adenoidectomy      as a child  . Dilation and curettage of uterus      in her early 60's for DUB  . Esophagogastroduodenoscopy Left 01/08/2016    Procedure: ESOPHAGOGASTRODUODENOSCOPY (EGD);  Surgeon: Arta Silence, MD;  Location: [facility];  Service: Endoscopy;  Laterality: Left;  . Radiology with anesthesia N/A 01/25/2016    Procedure: RADIOLOGY WITH  ANESTHESIA;  Surgeon: Luanne Bras, MD;  Location: [facility];  Service: Radiology;  Laterality: N/A;    There were no vitals filed for this visit.      Subjective Assessment - 03/11/16 1202    Subjective  Pt reports that she was sick earlier this week   Limitations Pt with SOB at end of PT session.    Patient Stated Goals Increased independence with household chores / daily tasks   Currently in Pain? No/denies           Therapist reviewed yellow theraband HEP issued last visit, 2 sets of 10 reps for each exercise, min v.c. Pt required frequent rest breaks due to fatigue. Yellow theraputty exercises  for composite grip and tip pinch                         OT Long Term Goals - 03/02/16 1559    OT LONG TERM GOAL #3   Title Patient will demonstrate sufficient activity tolerance to complete 25-30 minutes of household work (simple cleaning) without rest break   Status Achieved   OT LONG TERM GOAL #5   Title Patient will demonstrate improved stability in right hand for pinch.  Patient with significant arthritis in [facility] - May benefit from orthoses to increase stability   Status  Deferred               Plan - 03/11/16 1206    Clinical Impression Statement Pt demonstrates understanding of yellow HEP.   Rehab Potential Excellent   OT Frequency 2x / week   OT Duration 4 weeks   OT Treatment/Interventions Self-care/ADL training;Neuromuscular education;Therapeutic exercise;Energy conservation;DME and/or AE instruction;Therapist, nutritional;Therapeutic activities;Therapeutic exercises;Patient/family education;Balance training   Plan UE conditioning, hand strengthening,    OT Home Exercise Plan theraputty - yellow, theraband - yellow      Patient will benefit from skilled therapeutic intervention in order to improve the following deficits and impairments:  Decreased activity tolerance, Decreased balance, Decreased coordination, Decreased endurance,  Decreased strength, Impaired UE functional use, Pain  Visit Diagnosis: Muscle weakness (generalized)  Other lack of coordination    Problem List Patient Active Problem List   Diagnosis Date Noted  . Diplopia 01/29/2016  . GERD (gastroesophageal reflux disease) 01/29/2016  . Left middle cerebral artery stroke ([place]) 01/29/2016  . Persistent atrial fibrillation ([place])   . Gastrointestinal hemorrhage associated with intestinal diverticulosis   . Gait disturbance, post-stroke 01/27/2016  . Fall   . Secondary hypertension, unspecified   . Cerebral infarction due to thrombosis of left middle cerebral artery (HCC) s/p mechanical thrombectomy   . Malnutrition of moderate degree 01/09/2016  . GI bleed 01/06/2016  . Chronic atrial fibrillation ([place]) 06/10/2015  . Pulmonary hypertension, secondary ([place]) 06/10/2015  . Chronic anticoagulation 06/10/2015  . Hyperlipidemia 06/10/2015  . Essential hypertension 06/10/2015  . History of stroke 06/10/2015  . Coronary artery disease due to lipid rich plaque 06/10/2015  . Afib ([place])   . Elevated uric acid in blood   . Arthritis of ankle   . Pulmonary HTN ([place])     RINE,KATHRYN 03/11/2016, 12:15 PM Theone Murdoch, OTR/L [phone] Phone: [phone] 12:18 PM 03/11/2016 [facility] [address] [place], [place], [address] Phone: [phone]   Fax:  [phone]  Name: Adrienne [name] MRN: [id number] Date of Birth: 06-23-1928

## 2016-03-14 ENCOUNTER — Ambulatory Visit: Payer: Medicare Other | Admitting: Occupational Therapy

## 2016-03-14 ENCOUNTER — Ambulatory Visit: Payer: Medicare Other | Attending: Physical Medicine & Rehabilitation | Admitting: Rehabilitation

## 2016-03-14 ENCOUNTER — Encounter: Payer: Self-pay | Admitting: Rehabilitation

## 2016-03-14 DIAGNOSIS — R2689 Other abnormalities of gait and mobility: Secondary | ICD-10-CM | POA: Diagnosis not present

## 2016-03-14 DIAGNOSIS — R197 Diarrhea, unspecified: Secondary | ICD-10-CM | POA: Diagnosis not present

## 2016-03-14 DIAGNOSIS — R29898 Other symptoms and signs involving the musculoskeletal system: Secondary | ICD-10-CM | POA: Diagnosis not present

## 2016-03-14 DIAGNOSIS — I69351 Hemiplegia and hemiparesis following cerebral infarction affecting right dominant side: Secondary | ICD-10-CM

## 2016-03-14 DIAGNOSIS — R278 Other lack of coordination: Secondary | ICD-10-CM | POA: Insufficient documentation

## 2016-03-14 DIAGNOSIS — M6281 Muscle weakness (generalized): Secondary | ICD-10-CM | POA: Diagnosis not present

## 2016-03-14 NOTE — Patient Instructions (Addendum)
Feet Apart (Compliant Surface) Arm Motion - Eyes Closed    Stand on compliant surface: ___stacked pillows or cushion_____, feet shoulder width apart. Close eyes and keep arms by your side.  Repeat _3___ times per session 30 secs each. Do _1___ sessions per day.  Copyright  VHI. All rights reserved.    Feet Together, Head Motion - Eyes Closed    With eyes closed and feet together, move head slowly, up and down x 10 reps and side to side x 10 reps.   Do _1-2___ sessions per day.  Copyright  VHI. All rights reserved.   Feet Together (Compliant Surface) Arm Motion - Eyes Open    With eyes open, standing on compliant surface:___stacked pillows or cushion_____, feet together, keep arms by your side (you don't have to move your arms like in the picture) Repeat __3__ times per session 30 seconds. Do __2-3__ sessions per day.  Copyright  VHI. All rights reserved.   Feet Together (Compliant Surface) Head Motion - Eyes Open    With eyes open, standing on compliant surface: ___stacked pillows or cushion_____, feet together, move head slowly: up and down x 10 reps and side to side x 10 reps.    Do __1-2__ sessions per day.  Copyright  VHI. All rights reserved.

## 2016-03-14 NOTE — Patient Instructions (Signed)
Your Splint This splint should initially be fitted by a healthcare practitioner.  The healthcare practitioner is responsible for providing wearing instructions and precautions to the patient, other healthcare practitioners and care provider involved in the patient's care.  This splint was custom made for you. Please read the following instructions to learn about wearing and caring for your splint.  Precautions Should your splint cause any of the following problems, remove the splint immediately and contact your therapist/physician.  Swelling  Severe Pain  Pressure Areas  Stiffness  Numbness  Do not wear your splint while operating machinery unless it has been fabricated for that purpose.  When To Wear Your Splint Where your splint according to your therapist/physician instructions. Daytime for 1 hours initally then remove splint and check skin for pressure areas or redness that does not go away after 10-15 mins. If any problems stop wearing splint and bring it to therapy. If no problems you may progress to wearing 2-4 hrs at a time during daytime/ while awake.  Care and Cleaning of Your Splint 1. Keep your splint away from open flames. 2. Your splint will lose its shape in temperatures over 135 degrees Farenheit, ( in car windows, near radiators, ovens or in hot water).  Never make any adjustments to your splint, if the splint needs adjusting remove it and make an appointment to see your therapist. 3. Your splint,  may be cleaned with soap and lukewarm water or alcohol on a cotton ball. Do not immerse in hot water over 135 degrees Farenheit. 4. Straps may be washed with soap and water, but do not moisten the self-adhesive portion.

## 2016-03-14 NOTE — Therapy (Signed)
[facility] [address] [place], [place], [address] Phone: [phone]   Fax:  [phone]  Physical Therapy Treatment  Patient Details  Name: Adrienne [name] MRN: [id number] Date of Birth: 01-29-1928 Referring Provider: Dr. Letta Pate  Encounter Date: 03/14/2016      PT End of Session - 03/14/16 1023    Visit Number 7   Number of Visits 17   Date for PT Re-Evaluation 04/10/16   Authorization Type G-code and progress note every 10th visit.   PT Start Time 1017   PT Stop Time 1100   PT Time Calculation (min) 43 min   Equipment Utilized During Treatment Gait belt   Activity Tolerance Patient tolerated treatment well   Behavior During Therapy WFL for tasks assessed/performed      Past Medical History  Diagnosis Date  . Afib ([place])   . Elevated uric acid in blood   . Arthritis of ankle   . Pulmonary HTN ([place])   . Long-term (current) use of anticoagulants   . Moderate mitral regurgitation   . CAD (coronary artery disease)   . Edema   . Cancer [facility]) 2012    breast cancer-right  . Hypertension   . Urinary incontinence   . TIA (transient ischemic attack) 09/2011  . Diverticulosis   . Compression fracture of T12 vertebra [facility])     Past Surgical History  Procedure Laterality Date  . Cardiac catheterization  2013  . Replacement total knee bilateral    . Gallbladder surgery    . Appendectomy    . Mastectomy Right   . Cataract extraction, bilateral    . Breast surgery  2012    Rt.mastectomy--[place]  . Tonsillectomy and adenoidectomy      as a child  . Dilation and curettage of uterus      in her early 31's for DUB  . Esophagogastroduodenoscopy Left 01/08/2016    Procedure: ESOPHAGOGASTRODUODENOSCOPY (EGD);  Surgeon: Arta Silence, MD;  Location: [facility];  Service: Endoscopy;  Laterality: Left;  . Radiology with anesthesia N/A 01/25/2016    Procedure: RADIOLOGY WITH ANESTHESIA;  Surgeon: Luanne Bras, MD;  Location: [employer];  Service: Radiology;  Laterality: N/A;    There were no vitals filed for this visit.      Subjective Assessment - 03/14/16 1021    Subjective "I have not been walking with the cane, its been so busy."    Pertinent History A-fib, pulmonary HTN, HTN, hx of R breast CA in 2012, Hx of T12 compression fx (within the last year) but denied precautions, R ankle OA, Hx of GI hemorrhage with diverticulosis, CAD   Patient Stated Goals Walk without any assistance   Currently in Pain? No/denies            Gait:  Gait with SPC over paved outdoor surfaces x 300' with negotiation of curb step.  Requires S to min A (esp for curb step) with cues for sequencing and stepping through when performing curb step to improve balance during task.  Also continue to perform gait over indoor surfaces x 200' over varying surfaces to simulate home environment.   Pt continues to be S level.    NMR:  Went over corner balance exercises and removed feet together on solid surface and made additions as needed to challenge pt.  See pt instruction for details on exercises performed and reps.  PT Education - 03/14/16 1022    Education provided Yes   Education Details compliance with HEP and beginning to ambulate in house with [facility] with supervision from husband.     Person(s) Educated Patient   Methods Explanation   Comprehension Verbalized understanding          PT Short Term Goals - 03/11/16 1127    PT SHORT TERM GOAL #1   Title Pt will be IND in HEP in order to improve balance and strength. Target date: 03/09/16   Baseline met    Status Achieved   PT SHORT TERM GOAL #2   Title Pt will improve BERG score to >/=47/56 to decr. falls risk. Target date: 03/09/16   Baseline 46/56 on 03/11/16    Status Partially Met   PT SHORT TERM GOAL #3   Title Pt will perform TUG with LRAD in </=13.5sec. to decr. falls risk. Target date: 03/09/16   Baseline 12.85  secs with RW   Status Achieved   PT SHORT TERM GOAL #4   Title Pt will amb. 300' over even terrain with LRAD at MOD I level to improve functional mobilty. Target date: 03/09/16   Baseline mod I with RW, S with cane (provided education to begin gait with SPC at home with S from husband)   Status Achieved           PT Long Term Goals - 02/10/16 1140    PT LONG TERM GOAL #1   Title Pt will verbalize understanding of CVA symptoms/risk factors to decr. risk of additonal CVA. Target date: 04/06/16   Status New   PT LONG TERM GOAL #2   Title Pt will improve BERG score to >/=51/56 to decr. falls risk. Target date: 04/06/16   Status New   PT LONG TERM GOAL #3   Title Pt will be able to ascend/descend one step without handrail, with LRAD, at MOD I level to traverse step at home. Target date: 04/06/16   Status New   PT LONG TERM GOAL #4   Title Pt will amb. 500' over even/uneven terrain with LRAD at MOD I level to improve functional mobility. Target date: 04/06/16   Status New   PT LONG TERM GOAL #5   Title Pt will improve gait speed with LRAD to >/=2.93f/sec. to safely amb. in the community. Target date: 04/06/16               Plan - 03/14/16 1023    Clinical Impression Statement Skilled session focused on addressing HEP for corner balance tasks as previous exercises were becoming easier.  See pt instruction for details.  Also continued gait training over indoor and outdoor surfaces with SPC to increase independence with gait.  Note that she is still needing max cues for improved breath control during gait and exercises.     Rehab Potential Good   Clinical Impairments Affecting Rehab Potential co-morbidities   PT Frequency 2x / week   PT Duration 8 weeks   PT Treatment/Interventions ADLs/Self Care Home Management;Biofeedback;Canalith Repostioning;Vestibular;Manual techniques;Balance training;Therapeutic exercise;Therapeutic activities;Functional mobility training;Stair training;Gait  training;DME Instruction;Patient/family education;Neuromuscular re-education;Orthotic Fit/Training   PT Next Visit Plan advance corner balance tasks,  advance BLE strengthening as needed, continue balance on compliant surfaces, EO/EC, begin gait with SPC as well (needs cues for breath control)   Consulted and Agree with Plan of Care Patient      Patient will benefit from skilled therapeutic intervention in order to improve the following deficits and impairments:  Abnormal gait, Decreased endurance, Decreased knowledge of use of DME, Postural dysfunction, Decreased balance, Decreased mobility  Visit Diagnosis: Other abnormalities of gait and mobility  Muscle weakness (generalized)  Weakness of both lower extremities     Problem List Patient Active Problem List   Diagnosis Date Noted  . Diplopia 01/29/2016  . GERD (gastroesophageal reflux disease) 01/29/2016  . Left middle cerebral artery stroke ([place]) 01/29/2016  . Persistent atrial fibrillation ([place])   . Gastrointestinal hemorrhage associated with intestinal diverticulosis   . Gait disturbance, post-stroke 01/27/2016  . Fall   . Secondary hypertension, unspecified   . Cerebral infarction due to thrombosis of left middle cerebral artery (HCC) s/p mechanical thrombectomy   . Malnutrition of moderate degree 01/09/2016  . GI bleed 01/06/2016  . Chronic atrial fibrillation ([place]) 06/10/2015  . Pulmonary hypertension, secondary ([place]) 06/10/2015  . Chronic anticoagulation 06/10/2015  . Hyperlipidemia 06/10/2015  . Essential hypertension 06/10/2015  . History of stroke 06/10/2015  . Coronary artery disease due to lipid rich plaque 06/10/2015  . Afib ([place])   . Elevated uric acid in blood   . Arthritis of ankle   . Pulmonary HTN ([place])     Cameron Sprang, PT, MPT [facility] [address] [place], [place], [address] Phone: [phone]   Fax:  [phone] 03/14/2016, 1:12 PM  Name:  Adrienne [name] MRN: [id number] Date of Birth: 1927/12/17

## 2016-03-14 NOTE — Therapy (Signed)
[facility] [address] [place], [place], [address] Phone: [phone]   Fax:  [phone]  Occupational Therapy Treatment  Patient Details  Name: Adrienne [name] MRN: [id number] Date of Birth: 13-Apr-1928 Referring Provider: Dr Letta Pate  Encounter Date: 03/14/2016      OT End of Session - 03/14/16 1654    Visit Number 6   Number of Visits 9   Date for OT Re-Evaluation 03/16/16   Authorization Type Medicare - G code/ progress note 10th visit   Authorization - Visit Number 6   Authorization - Number of Visits 10   OT Start Time 1107   OT Stop Time 1146   OT Time Calculation (min) 39 min      Past Medical History  Diagnosis Date  . Afib ([place])   . Elevated uric acid in blood   . Arthritis of ankle   . Pulmonary HTN ([place])   . Long-term (current) use of anticoagulants   . Moderate mitral regurgitation   . CAD (coronary artery disease)   . Edema   . Cancer [facility]) 2012    breast cancer-right  . Hypertension   . Urinary incontinence   . TIA (transient ischemic attack) 09/2011  . Diverticulosis   . Compression fracture of T12 vertebra [facility])     Past Surgical History  Procedure Laterality Date  . Cardiac catheterization  2013  . Replacement total knee bilateral    . Gallbladder surgery    . Appendectomy    . Mastectomy Right   . Cataract extraction, bilateral    . Breast surgery  2012    R[place]astectomy--[place]  . Tonsillectomy and adenoidectomy      as a child  . Dilation and curettage of uterus      in her early 45's for DUB  . Esophagogastroduodenoscopy Left 01/08/2016    Procedure: ESOPHAGOGASTRODUODENOSCOPY (EGD);  Surgeon: Arta Silence, MD;  Location: [facility];  Service: Endoscopy;  Laterality: Left;  . Radiology with anesthesia N/A 01/25/2016    Procedure: RADIOLOGY WITH ANESTHESIA;  Surgeon: Luanne Bras, MD;  Location: [facility];  Service: Radiology;  Laterality: N/A;    There were  no vitals filed for this visit.      Subjective Assessment - 03/14/16 1653    Subjective  Pt denies pain   Patient Stated Goals Increased independence with household chores / daily tasks   Currently in Pain? No/denies         Pt was fitted with a custom dorsal CMC splint for right thumb positioning. Pt practiced donning and doffing after cueing/ demonstration. Pt reports increased comfort.  Next visit plan to assess fit and whether volar strap is needed.                      OT Education - 03/14/16 1703    Education provided Yes   Education Details splint wear, care and precautions.   Person(s) Educated Patient   Methods Explanation;Demonstration;Verbal cues;Handout   Comprehension Verbalized understanding;Returned demonstration;Verbal cues required             OT Long Term Goals - 03/11/16 1358    OT LONG TERM GOAL #1   Title Patient will be independent with home exercise program designed to improve strength in UE's    Status On-going   OT LONG TERM GOAL #2   Title Patient will be modified independent for shower transfers   Status On-going   OT LONG TERM GOAL #3  Title Patient will demonstrate sufficient activity tolerance to complete 25-30 minutes of household work (simple cleaning) without rest break   Status Achieved   OT LONG TERM GOAL #4   Title Patient will demonstrate improved strength in Bilateral UE's in gross grasp by 5 lbs to aide in IADL   Status On-going   OT LONG TERM GOAL #5   Title Patient will demonstrate improved stability in right hand for pinch.  Patient with significant arthritis in [facility] - May benefit from orthoses to increase stability   Status On-going               Plan - 03/14/16 1659    Clinical Impression Statement Pt was fitted with a dorsal CMC splint for improved thumb positioning and prevention of deformity.    Rehab Potential Excellent   OT Frequency 2x / week   OT Duration 4 weeks   OT  Treatment/Interventions Self-care/ADL training;Neuromuscular education;Therapeutic exercise;Energy conservation;DME and/or AE instruction;Therapist, nutritional;Therapeutic activities;Therapeutic exercises;Patient/family education;Balance training   Plan splint check, check progress towards goals, pt is only scheduled for visit on 03/16/16-consider scheduling additional visits   OT Home Exercise Plan theraputty - yellow, theraband - yellow   Consulted and Agree with Plan of Care Patient      Patient will benefit from skilled therapeutic intervention in order to improve the following deficits and impairments:  Decreased activity tolerance, Decreased balance, Decreased coordination, Decreased endurance, Decreased strength, Impaired UE functional use, Pain  Visit Diagnosis: Muscle weakness (generalized)  Other lack of coordination  Hemiplegia and hemiparesis following cerebral infarction affecting right dominant side [facility])    Problem List Patient Active Problem List   Diagnosis Date Noted  . Diplopia 01/29/2016  . GERD (gastroesophageal reflux disease) 01/29/2016  . Left middle cerebral artery stroke ([place]) 01/29/2016  . Persistent atrial fibrillation ([place])   . Gastrointestinal hemorrhage associated with intestinal diverticulosis   . Gait disturbance, post-stroke 01/27/2016  . Fall   . Secondary hypertension, unspecified   . Cerebral infarction due to thrombosis of left middle cerebral artery (HCC) s/p mechanical thrombectomy   . Malnutrition of moderate degree 01/09/2016  . GI bleed 01/06/2016  . Chronic atrial fibrillation ([place]) 06/10/2015  . Pulmonary hypertension, secondary ([place]) 06/10/2015  . Chronic anticoagulation 06/10/2015  . Hyperlipidemia 06/10/2015  . Essential hypertension 06/10/2015  . History of stroke 06/10/2015  . Coronary artery disease due to lipid rich plaque 06/10/2015  . Afib ([place])   . Elevated uric acid in blood   . Arthritis of ankle   . Pulmonary HTN  ([place])     Brentley Landfair 03/14/2016, 5:06 PM Theone Murdoch, OTR/L [phone] Phone: [phone] 5:06 PM 03/14/2016 [facility] [address] [place], [place], [address] Phone: [phone]   Fax:  [phone]  Name: Adrienne [name] MRN: [id number] Date of Birth: 1928-03-29

## 2016-03-16 ENCOUNTER — Ambulatory Visit: Payer: Medicare Other | Admitting: Occupational Therapy

## 2016-03-16 ENCOUNTER — Encounter: Payer: Self-pay | Admitting: Occupational Therapy

## 2016-03-16 DIAGNOSIS — R2689 Other abnormalities of gait and mobility: Secondary | ICD-10-CM | POA: Diagnosis not present

## 2016-03-16 DIAGNOSIS — R29898 Other symptoms and signs involving the musculoskeletal system: Secondary | ICD-10-CM | POA: Diagnosis not present

## 2016-03-16 DIAGNOSIS — M6281 Muscle weakness (generalized): Secondary | ICD-10-CM

## 2016-03-16 DIAGNOSIS — I69351 Hemiplegia and hemiparesis following cerebral infarction affecting right dominant side: Secondary | ICD-10-CM

## 2016-03-16 DIAGNOSIS — R278 Other lack of coordination: Secondary | ICD-10-CM | POA: Diagnosis not present

## 2016-03-16 NOTE — Therapy (Signed)
[facility] [address] [place], [place], [address] Phone: [phone]   Fax:  [phone]  Occupational Therapy Treatment  Patient Details  Name: Adrienne [name] MRN: [id number] Date of Birth: [birth year] Referring Provider: Dr Letta Pate  Encounter Date: 03/16/2016      OT End of Session - 03/16/16 1531    Visit Number 7   Number of Visits 9   Date for OT Re-Evaluation 03/16/16   Authorization Type Medicare - G code/ progress note 10th visit   Authorization - Visit Number 7   Authorization - Number of Visits 10   OT Start Time 5027   OT Stop Time 1525   OT Time Calculation (min) 38 min   Activity Tolerance Patient tolerated treatment well   Behavior During Therapy [facility] for tasks assessed/performed      Past Medical History  Diagnosis Date  . Afib ([place])   . Elevated uric acid in blood   . Arthritis of ankle   . Pulmonary HTN ([place])   . Long-term (current) use of anticoagulants   . Moderate mitral regurgitation   . CAD (coronary artery disease)   . Edema   . Cancer [facility]) 2012    breast cancer-right  . Hypertension   . Urinary incontinence   . TIA (transient ischemic attack) 09/2011  . Diverticulosis   . Compression fracture of T12 vertebra [facility])     Past Surgical History  Procedure Laterality Date  . Cardiac catheterization  2013  . Replacement total knee bilateral    . Gallbladder surgery    . Appendectomy    . Mastectomy Right   . Cataract extraction, bilateral    . Breast surgery  2012    R[place]astectomy--[place]  . Tonsillectomy and adenoidectomy      as a child  . Dilation and curettage of uterus      in her early 27's for DUB  . Esophagogastroduodenoscopy Left 01/08/2016    Procedure: ESOPHAGOGASTRODUODENOSCOPY (EGD);  Surgeon: Arta Silence, MD;  Location: [facility];  Service: Endoscopy;  Laterality: Left;  . Radiology with anesthesia N/A 01/25/2016    Procedure: RADIOLOGY WITH  ANESTHESIA;  Surgeon: Luanne Bras, MD;  Location: [facility];  Service: Radiology;  Laterality: N/A;    There were no vitals filed for this visit.      Subjective Assessment - 03/16/16 1501    Subjective  I like this brace a whole lot.     Patient Stated Goals Increased independence with household chores / daily tasks   Currently in Pain? No/denies   Pain Score 0-No pain            [facility] OT Assessment - 03/16/16 0001    Coordination   Right 9 Hole Peg Test 19.69   Left 9 Hole Peg Test 21.18                  OT Treatments/Exercises (OP) - 03/16/16 0001    ADLs   Bathing Patient has removed tub seat from shower, and has showered independently.  Patrient indicates this is so much easier, and she reports feeling very safe.     Splinting   Splinting Paient retunred with splint - with one place of discomfort when using splint for writing. Splint modified to open area over first metacarpal on dorsum of hand.  Patient then able to write without discomfort.  Reviewed wearing schedule, and effective techniques for donning and doffing.  Patient has times when splint is more  helpful for function (to increase pinch power) and times when it is less effective - fine coordination or grip power.)                OT Education - 2016-03-20 1530    Education provided Yes   Education Details splint donning / doffing, splint use, wearing schedule, long term goals and discharge plan   Person(s) Educated Patient   Methods Explanation;Demonstration   Comprehension Verbalized understanding;Returned demonstration             OT Long Term Goals - 2016-03-20 1504    OT LONG TERM GOAL #1   Title Patient will be independent with home exercise program designed to improve strength in UE's    Status Achieved   OT LONG TERM GOAL #2   Title Patient will be modified independent for shower transfers   Status Achieved   OT LONG TERM GOAL #3   Title Patient will demonstrate sufficient  activity tolerance to complete 25-30 minutes of household work (simple cleaning) without rest break   Status Achieved   OT LONG TERM GOAL #4   Title Patient will demonstrate improved strength in Bilateral UE's in gross grasp by 5 lbs to aide in IADL   Status Achieved   OT LONG TERM GOAL #5   Title Patient will demonstrate improved stability in right hand for pinch.  Patient with significant arthritis in [facility] - May benefit from orthoses to increase stability   Status Achieved  8 lbs bilaterally- right with [facility] brace               Plan - 20-Mar-2016 1531    Clinical Impression Statement Patient has made significant progress and has met all long term goals.   Patient agreeable to OT discharge.     Rehab Potential Excellent   OT Frequency 2x / week   OT Duration 4 weeks   OT Treatment/Interventions Self-care/ADL training;Neuromuscular education;Therapeutic exercise;Energy conservation;DME and/or AE instruction;Therapist, nutritional;Therapeutic activities;Therapeutic exercises;Patient/family education;Balance training   Plan Dischareg from [place] theraputty - yellow, theraband - yellow   Consulted and Agree with Plan of Care Patient      Patient will benefit from skilled therapeutic intervention in order to improve the following deficits and impairments:  Decreased activity tolerance, Decreased balance, Decreased coordination, Decreased endurance, Decreased strength, Impaired UE functional use, Pain  Visit Diagnosis: Muscle weakness (generalized)  Other lack of coordination  Hemiplegia and hemiparesis following cerebral infarction affecting right dominant side (HCC)      G-Codes - March 20, 2016 1533    Functional Assessment Tool Used 9 hole peg test   Functional Limitation Carrying, moving and handling objects   Carrying, Moving and Handling Objects Current Status [phone]) At least 20 percent but less than 40 percent impaired, limited or restricted    Carrying, Moving and Handling Objects Goal Status ([id number]) At least 1 percent but less than 20 percent impaired, limited or restricted   Carrying, Moving and Handling Objects Discharge Status [phone]) At least 1 percent but less than 20 percent impaired, limited or restricted      Problem List Patient Active Problem List   Diagnosis Date Noted  . Diplopia 01/29/2016  . GERD (gastroesophageal reflux disease) 01/29/2016  . Left middle cerebral artery stroke ([place]) 01/29/2016  . Persistent atrial fibrillation ([place])   . Gastrointestinal hemorrhage associated with intestinal diverticulosis   . Gait disturbance, post-stroke 01/27/2016  . Fall   . Secondary hypertension, unspecified   .  Cerebral infarction due to thrombosis of left middle cerebral artery (HCC) s/p mechanical thrombectomy   . Malnutrition of moderate degree 01/09/2016  . GI bleed 01/06/2016  . Chronic atrial fibrillation ([place]) 06/10/2015  . Pulmonary hypertension, secondary ([place]) 06/10/2015  . Chronic anticoagulation 06/10/2015  . Hyperlipidemia 06/10/2015  . Essential hypertension 06/10/2015  . History of stroke 06/10/2015  . Coronary artery disease due to lipid rich plaque 06/10/2015  . Afib ([place])   . Elevated uric acid in blood   . Arthritis of ankle   . Pulmonary HTN ([place])    OCCUPATIONAL THERAPY DISCHARGE SUMMARY  Visits from Start of Care: 7  Current functional level related to goals / functional outcomes: Mod I / I with ADL, returning to prior activity tolerance with IADL   Remaining deficits: Right shoulder weakness (4/5)   Education / Equipment: HEP, Adaptive equipment, activity tolerance Plan: Patient agrees to discharge.  Patient goals were not met. Patient is being discharged due to meeting the stated rehab goals.  ?????       Mariah Milling, OTR/L 03/16/2016, 3:33 PM  [facility] [address] [place], [place], [address] Phone:  [phone]   Fax:  [phone]  Name: Kaedance Magos MRN: [id number] Date of Birth: [birth year]

## 2016-03-22 ENCOUNTER — Ambulatory Visit: Payer: Medicare Other

## 2016-03-22 VITALS — BP 129/69 | HR 52

## 2016-03-22 DIAGNOSIS — N390 Urinary tract infection, site not specified: Secondary | ICD-10-CM | POA: Diagnosis not present

## 2016-03-22 DIAGNOSIS — N139 Obstructive and reflux uropathy, unspecified: Secondary | ICD-10-CM | POA: Diagnosis not present

## 2016-03-22 DIAGNOSIS — R29898 Other symptoms and signs involving the musculoskeletal system: Secondary | ICD-10-CM | POA: Diagnosis not present

## 2016-03-22 DIAGNOSIS — R278 Other lack of coordination: Secondary | ICD-10-CM | POA: Diagnosis not present

## 2016-03-22 DIAGNOSIS — M6281 Muscle weakness (generalized): Secondary | ICD-10-CM

## 2016-03-22 DIAGNOSIS — R2689 Other abnormalities of gait and mobility: Secondary | ICD-10-CM | POA: Diagnosis not present

## 2016-03-22 DIAGNOSIS — I69351 Hemiplegia and hemiparesis following cerebral infarction affecting right dominant side: Secondary | ICD-10-CM | POA: Diagnosis not present

## 2016-03-22 DIAGNOSIS — R300 Dysuria: Secondary | ICD-10-CM | POA: Diagnosis not present

## 2016-03-22 NOTE — Therapy (Signed)
[facility] [address] [place], [place], [address] Phone: [phone]   Fax:  [phone]  Physical Therapy Treatment  Patient Details  Name: Adrienne [name] MRN: [id number] Date of Birth: 13-Jan-1928 Referring Provider: Dr. Letta Pate  Encounter Date: 03/22/2016      PT End of Session - 03/22/16 1314    Visit Number 8   Number of Visits 17   Date for PT Re-Evaluation 04/10/16   Authorization Type G-code and progress note every 10th visit.   PT Start Time 1022  pt late   PT Stop Time 1100   PT Time Calculation (min) 38 min   Equipment Utilized During Treatment Gait belt   Activity Tolerance Patient limited by fatigue   Behavior During Therapy WFL for tasks assessed/performed      Past Medical History  Diagnosis Date  . Afib ([place])   . Elevated uric acid in blood   . Arthritis of ankle   . Pulmonary HTN ([place])   . Long-term (current) use of anticoagulants   . Moderate mitral regurgitation   . CAD (coronary artery disease)   . Edema   . Cancer [facility]) 2012    breast cancer-right  . Hypertension   . Urinary incontinence   . TIA (transient ischemic attack) 09/2011  . Diverticulosis   . Compression fracture of T12 vertebra [facility])     Past Surgical History  Procedure Laterality Date  . Cardiac catheterization  2013  . Replacement total knee bilateral    . Gallbladder surgery    . Appendectomy    . Mastectomy Right   . Cataract extraction, bilateral    . Breast surgery  2012    Rt.[place]astectomy--[place]  . Tonsillectomy and adenoidectomy      as a child  . Dilation and curettage of uterus      in her early 79's for DUB  . Esophagogastroduodenoscopy Left 01/08/2016    Procedure: ESOPHAGOGASTRODUODENOSCOPY (EGD);  Surgeon: Arta Silence, MD;  Location: [facility];  Service: Endoscopy;  Laterality: Left;  . Radiology with anesthesia N/A 01/25/2016    Procedure: RADIOLOGY WITH ANESTHESIA;  Surgeon:  Luanne Bras, MD;  Location: [facility];  Service: Radiology;  Laterality: N/A;    Filed Vitals:   03/22/16 1056  BP: 129/69  Pulse: 52  SpO2: 95%        Subjective Assessment - 03/22/16 1024    Subjective Pt denied falls since last visit. Pt reported she believes she has an UTI and scheduled an appt. with MD this afternoon. Pt has been using SPC at home with no issues, but uses RW at night.    Pertinent History A-fib, pulmonary HTN, HTN, hx of R breast CA in 2012, Hx of T12 compression fx (within the last year) but denied precautions, R ankle OA, Hx of GI hemorrhage with diverticulosis, CAD   Patient Stated Goals Walk without any assistance   Currently in Pain? No/denies       Neuro re-ed: Pt performed and reviewed balance HEP and progressed as tolerated with cues for technique. Performed with min guard-S for safety. Please see pt instructions for details.                  [facility] Adult PT Treatment/Exercise - 03/22/16 1057    Ambulation/Gait   Ambulation/Gait Yes   Ambulation/Gait Assistance 5: Supervision;4: Min guard   Ambulation/Gait Assistance Details Cues for sequencing and to improve upright posture and stride length and L step length. Pt  required a seated rest breaks after amb. 2/2 fatigue and slight SOB. Vitals WNL.   Ambulation Distance (Feet) 250 Feet  with SPC and 36' with RW   Assistive device Rolling walker;Straight cane   Gait Pattern Step-through pattern;Trendelenburg;Decreased stride length;Decreased step length - left   Ambulation Surface Level;Unlevel;Indoor;Outdoor;Paved                PT Education - 03/22/16 1314    Education provided Yes   Education Details PT reviewed and progressed balance HEP.   Person(s) Educated Patient   Methods Explanation;Demonstration;Verbal cues;Handout;Tactile cues   Comprehension Returned demonstration;Verbalized understanding          PT Short Term Goals - 03/11/16 1127    PT SHORT TERM GOAL #1    Title Pt will be IND in HEP in order to improve balance and strength. Target date: 03/09/16   Baseline met    Status Achieved   PT SHORT TERM GOAL #2   Title Pt will improve BERG score to >/=47/56 to decr. falls risk. Target date: 03/09/16   Baseline 46/56 on 03/11/16    Status Partially Met   PT SHORT TERM GOAL #3   Title Pt will perform TUG with LRAD in </=13.5sec. to decr. falls risk. Target date: 03/09/16   Baseline 12.85 secs with RW   Status Achieved   PT SHORT TERM GOAL #4   Title Pt will amb. 300' over even terrain with LRAD at MOD I level to improve functional mobilty. Target date: 03/09/16   Baseline mod I with RW, S with cane (provided education to begin gait with SPC at home with S from husband)   Status Achieved           PT Long Term Goals - 02/10/16 1140    PT LONG TERM GOAL #1   Title Pt will verbalize understanding of CVA symptoms/risk factors to decr. risk of additonal CVA. Target date: 04/06/16   Status New   PT LONG TERM GOAL #2   Title Pt will improve BERG score to >/=51/56 to decr. falls risk. Target date: 04/06/16   Status New   PT LONG TERM GOAL #3   Title Pt will be able to ascend/descend one step without handrail, with LRAD, at MOD I level to traverse step at home. Target date: 04/06/16   Status New   PT LONG TERM GOAL #4   Title Pt will amb. 500' over even/uneven terrain with LRAD at MOD I level to improve functional mobility. Target date: 04/06/16   Status New   PT LONG TERM GOAL #5   Title Pt will improve gait speed with LRAD to >/=2.27f/sec. to safely amb. in the community. Target date: 04/06/16               Plan - 03/22/16 1315    Clinical Impression Statement Pt demonstrated progress as she was able to tolerated progression of corner balance HEP, in order to challenge vestibular system. Pt continues to require cues for sequencing with SPC and required rest breaks 2/2 fatigue and slight SOB, vitals WNL after session. Continue with POC.   Rehab  Potential Good   Clinical Impairments Affecting Rehab Potential co-morbidities   PT Frequency 2x / week   PT Duration 8 weeks   PT Treatment/Interventions ADLs/Self Care Home Management;Biofeedback;Canalith Repostioning;Vestibular;Manual techniques;Balance training;Therapeutic exercise;Therapeutic activities;Functional mobility training;Stair training;Gait training;DME Instruction;Patient/family education;Neuromuscular re-education;Orthotic Fit/Training   PT Next Visit Plan advance BLE strengthening as needed, continue balance on compliant surfaces, EO/EC, continue gait  with SPC as well (needs cues for breath control)   PT Home Exercise Plan balance and strengthening HEP   Consulted and Agree with Plan of Care Patient      Patient will benefit from skilled therapeutic intervention in order to improve the following deficits and impairments:  Abnormal gait, Decreased endurance, Decreased knowledge of use of DME, Postural dysfunction, Decreased balance, Decreased mobility  Visit Diagnosis: Other lack of coordination  Muscle weakness (generalized)     Problem List Patient Active Problem List   Diagnosis Date Noted  . Diplopia 01/29/2016  . GERD (gastroesophageal reflux disease) 01/29/2016  . Left middle cerebral artery stroke ([place]) 01/29/2016  . Persistent atrial fibrillation ([place])   . Gastrointestinal hemorrhage associated with intestinal diverticulosis   . Gait disturbance, post-stroke 01/27/2016  . Fall   . Secondary hypertension, unspecified   . Cerebral infarction due to thrombosis of left middle cerebral artery ([facility]) s/p mechanical thrombectomy   . Malnutrition of moderate degree 01/09/2016  . GI bleed 01/06/2016  . Chronic atrial fibrillation ([place]) 06/10/2015  . Pulmonary hypertension, secondary ([place]) 06/10/2015  . Chronic anticoagulation 06/10/2015  . Hyperlipidemia 06/10/2015  . Essential hypertension 06/10/2015  . History of stroke 06/10/2015  . Coronary artery disease  due to lipid rich plaque 06/10/2015  . Afib ([place])   . Elevated uric acid in blood   . Arthritis of ankle   . Pulmonary HTN ([place])     , L 03/22/2016, 1:16 PM  [facility] [address] [place], [place], [address] Phone: [phone]   Fax:  [phone]  Name: Adrienne [name] MRN: [id number] Date of Birth: Jan 31, 1928    Geoffry Paradise, PT,DPT 03/22/2016 1:17 PM Phone: [phone] Fax: [phone]

## 2016-03-22 NOTE — Patient Instructions (Addendum)
Perform in a corner with chair in front of you for safety:  Feet Apart (Compliant Surface) Arm Motion - Eyes Closed    Stand on compliant surface: ___stacked pillows or cushion_____, feet shoulder width apart. Close eyes and move arms forward, up to shoulder height.  Repeat _3___ times per session 30 secs each. Do _1___ sessions per day.  Copyright  VHI. All rights reserved.      Feet Together (Compliant Surface) Head Motion - Eyes Open    With eyes open, standing on compliant surface: ___stacked pillows or cushion_____, feet together, move head slowly: up and down x 10 reps and side to side x 10 reps.  Do __1-2__ sessions per day.  Copyright  VHI. All rights reserved.   Copyright  VHI. All rights reserved. Feet Together (Compliant Surface) Varied Arm Positions - Eyes Closed    Stand on compliant surface: __pillow/cushion______ with feet together and arms at your side. Close eyes and visualize upright position. Hold_10-30___ seconds. Repeat _3___ times per session. Do __1__ sessions per day.  Copyright  VHI. All rights reserved.   Feet Apart (Compliant Surface) Arm Motion - Eyes Closed    Stand on compliant surface: ___stacked pillows or cushion_____, feet shoulder width apart. Close eyes and keep arms by your side.  Repeat _3___ times per session 30 secs each. Do _1___ sessions per day.  Copyright  VHI. All rights reserved.      Feet Together (Compliant Surface) Head Motion - Eyes Open    With eyes open, standing on compliant surface: ___stacked pillows or cushion_____, feet together, move head slowly: up and down x 10 reps and side to side x 10 reps.  Do __1-2__ sessions per day.  Copyright  VHI. All rights reserved.

## 2016-03-24 ENCOUNTER — Ambulatory Visit: Payer: Medicare Other

## 2016-03-24 DIAGNOSIS — Z79811 Long term (current) use of aromatase inhibitors: Secondary | ICD-10-CM | POA: Diagnosis not present

## 2016-03-24 DIAGNOSIS — R911 Solitary pulmonary nodule: Secondary | ICD-10-CM | POA: Diagnosis not present

## 2016-03-24 DIAGNOSIS — C50911 Malignant neoplasm of unspecified site of right female breast: Secondary | ICD-10-CM | POA: Diagnosis not present

## 2016-03-24 DIAGNOSIS — M8580 Other specified disorders of bone density and structure, unspecified site: Secondary | ICD-10-CM | POA: Diagnosis not present

## 2016-03-24 DIAGNOSIS — Z9011 Acquired absence of right breast and nipple: Secondary | ICD-10-CM | POA: Diagnosis not present

## 2016-03-24 DIAGNOSIS — Z170 Estrogen receptor positive status [ER+]: Secondary | ICD-10-CM | POA: Diagnosis not present

## 2016-03-29 ENCOUNTER — Encounter: Payer: Self-pay | Admitting: Physical Therapy

## 2016-03-29 ENCOUNTER — Ambulatory Visit: Payer: Medicare Other | Admitting: Physical Therapy

## 2016-03-29 DIAGNOSIS — R278 Other lack of coordination: Secondary | ICD-10-CM | POA: Diagnosis not present

## 2016-03-29 DIAGNOSIS — R2689 Other abnormalities of gait and mobility: Secondary | ICD-10-CM

## 2016-03-29 DIAGNOSIS — R29898 Other symptoms and signs involving the musculoskeletal system: Secondary | ICD-10-CM | POA: Diagnosis not present

## 2016-03-29 DIAGNOSIS — M6281 Muscle weakness (generalized): Secondary | ICD-10-CM | POA: Diagnosis not present

## 2016-03-29 DIAGNOSIS — I69351 Hemiplegia and hemiparesis following cerebral infarction affecting right dominant side: Secondary | ICD-10-CM | POA: Diagnosis not present

## 2016-03-30 NOTE — Therapy (Signed)
[facility] [address] [place], [place], [address] Phone: [phone]   Fax:  [phone]  Physical Therapy Treatment  Patient Details  Name: Adrienne [name] MRN: [id number] Date of Birth: 06/12/1928 Referring Provider: Dr. Kirsteins  Encounter Date: 03/29/2016      PT End of Session - 03/29/16 1400    Visit Number 9   Number of Visits 17   Date for PT Re-Evaluation 04/10/16   Authorization Type G-code and progress note every 10th visit.   PT Start Time 1318   PT Stop Time 1400   PT Time Calculation (min) 42 min   Equipment Utilized During Treatment Gait belt   Activity Tolerance Patient limited by fatigue   Behavior During Therapy WFL for tasks assessed/performed      Past Medical History  Diagnosis Date  . Afib ([facility])   . Elevated uric acid in blood   . Arthritis of ankle   . Pulmonary HTN ([facility])   . Long-term (current) use of anticoagulants   . Moderate mitral regurgitation   . CAD (coronary artery disease)   . Edema   . Cancer ([facility]) 2012    breast cancer-right  . Hypertension   . Urinary incontinence   . TIA (transient ischemic attack) 09/2011  . Diverticulosis   . Compression fracture of T12 vertebra ([facility])     Past Surgical History  Procedure Laterality Date  . Cardiac catheterization  2013  . Replacement total knee bilateral    . Gallbladder surgery    . Appendectomy    . Mastectomy Right   . Cataract extraction, bilateral    . Breast surgery  2012    Rt.[place]astectomy--[place]  . Tonsillectomy and adenoidectomy      as a child  . Dilation and curettage of uterus      in her early 40's for DUB  . Esophagogastroduodenoscopy Left 01/08/2016    Procedure: ESOPHAGOGASTRODUODENOSCOPY (EGD);  Surgeon: William Outlaw, MD;  Location: [facility];  Service: Endoscopy;  Laterality: Left;  . Radiology with anesthesia N/A 01/25/2016    Procedure: RADIOLOGY WITH ANESTHESIA;  Surgeon: Sanjeev  Deveshwar, MD;  Location: [facility];  Service: Radiology;  Laterality: N/A;    There were no vitals filed for this visit.      Subjective Assessment - 03/29/16 1321    Subjective She is using a cane in her home as advised but she bumped her left knee with the cane in a turn to the right. She reports she is nervous with cane use. Also used cane and holding husband to get into the church on Sunday.    Pertinent History A-fib, pulmonary HTN, HTN, hx of R breast CA in 2012, Hx of T12 compression fx (within the last year) but denied precautions, R ankle OA, Hx of GI hemorrhage with diverticulosis, CAD   Patient Stated Goals Walk without any assistance   Currently in Pain? No/denies                         [facility] Adult PT Treatment/Exercise - 03/29/16 1315    Ambulation/Gait   Ambulation/Gait Yes   Ambulation/Gait Assistance 5: Supervision;4: Min guard   Ambulation/Gait Assistance Details trialed single point cane w/ normal tip & w/ quad tip. Pt had better coordination & balance with standard tip. PT verbally & tactile cues for upright posture using her vision to facilitate. Used humming music to improve fluency during gait.    Ambulation Distance (  Feet) 250 Feet  250' X 2 including negotiating around furniture   Assistive device Straight cane;Rolling walker  arrived using RW & PT session focused on cane gait   Gait Pattern Step-through pattern;Trendelenburg;Decreased stride length;Decreased step length - left   Ambulation Surface Indoor;Level   High Level Balance   High Level Balance Activities Negotitating around obstacles;Turns             Balance Exercises - 03/29/16 1315    Balance Exercises: Standing   Standing Eyes Opened Narrow base of support (BOS);Head turns;Solid surface;Wide (BOA);Foam/compliant surface  narrow with EO, wide with EC, 10 reps 4 direction head turns   Standing Eyes Closed Wide (BOA);Head turns;Solid surface  10 reps 4 direction head turns            PT Education - 03/29/16 1315    Education provided Yes   Education Details impaired safety with using lower supportive device like cane for longer distances than PT had trained (in/out of large church holding her husband)   Person(s) Educated Patient   Methods Explanation;Verbal cues   Comprehension Verbalized understanding;Verbal cues required          PT Short Term Goals - 03/11/16 1127    PT SHORT TERM GOAL #1   Title Pt will be IND in HEP in order to improve balance and strength. Target date: 03/09/16   Baseline met    Status Achieved   PT SHORT TERM GOAL #2   Title Pt will improve BERG score to >/=47/56 to decr. falls risk. Target date: 03/09/16   Baseline 46/56 on 03/11/16    Status Partially Met   PT SHORT TERM GOAL #3   Title Pt will perform TUG with LRAD in </=13.5sec. to decr. falls risk. Target date: 03/09/16   Baseline 12.85 secs with RW   Status Achieved   PT SHORT TERM GOAL #4   Title Pt will amb. 300' over even terrain with LRAD at MOD I level to improve functional mobilty. Target date: 03/09/16   Baseline mod I with RW, S with cane (provided education to begin gait with SPC at home with S from husband)   Status Achieved           PT Long Term Goals - 02/10/16 1140    PT LONG TERM GOAL #1   Title Pt will verbalize understanding of CVA symptoms/risk factors to decr. risk of additonal CVA. Target date: 04/06/16   Status New   PT LONG TERM GOAL #2   Title Pt will improve BERG score to >/=51/56 to decr. falls risk. Target date: 04/06/16   Status New   PT LONG TERM GOAL #3   Title Pt will be able to ascend/descend one step without handrail, with LRAD, at MOD I level to traverse step at home. Target date: 04/06/16   Status New   PT LONG TERM GOAL #4   Title Pt will amb. 500' over even/uneven terrain with LRAD at MOD I level to improve functional mobility. Target date: 04/06/16   Status New   PT LONG TERM GOAL #5   Title Pt will improve gait speed with  LRAD to >/=2.62ft/sec. to safely amb. in the community. Target date: 04/06/16               Plan - 03/29/16 1400    Clinical Impression Statement Patient improved gait with skilled PT instruction / cues on safety & posture and using music / humming to create a rhythm in gait. Patient   needs to work on negotiating around obstacles and scanning environment with walking more.    Rehab Potential Good   Clinical Impairments Affecting Rehab Potential co-morbidities   PT Frequency 2x / week   PT Duration 8 weeks   PT Treatment/Interventions ADLs/Self Care Home Management;Biofeedback;Canalith Repostioning;Vestibular;Manual techniques;Balance training;Therapeutic exercise;Therapeutic activities;Functional mobility training;Stair training;Gait training;DME Instruction;Patient/family education;Neuromuscular re-education;Orthotic Fit/Training   PT Next Visit Plan G-code using [place], gait speed & TUG; advance BLE strengthening as needed, continue balance on compliant surfaces, EO/EC, continue gait with SPC as well (needs cues for breath control)   PT Home Exercise Plan balance and strengthening HEP   Consulted and Agree with Plan of Care Patient      Patient will benefit from skilled therapeutic intervention in order to improve the following deficits and impairments:  Abnormal gait, Decreased endurance, Decreased knowledge of use of DME, Postural dysfunction, Decreased balance, Decreased mobility  Visit Diagnosis: Other lack of coordination  Muscle weakness (generalized)  Other abnormalities of gait and mobility  Weakness of both lower extremities     Problem List Patient Active Problem List   Diagnosis Date Noted  . Diplopia 01/29/2016  . GERD (gastroesophageal reflux disease) 01/29/2016  . Left middle cerebral artery stroke ([facility]) 01/29/2016  . Persistent atrial fibrillation ([facility])   . Gastrointestinal hemorrhage associated with intestinal diverticulosis   . Gait disturbance,  post-stroke 01/27/2016  . Fall   . Secondary hypertension, unspecified   . Cerebral infarction due to thrombosis of left middle cerebral artery ([facility]) s/p mechanical thrombectomy   . Malnutrition of moderate degree 01/09/2016  . GI bleed 01/06/2016  . Chronic atrial fibrillation ([facility]) 06/10/2015  . Pulmonary hypertension, secondary ([facility]) 06/10/2015  . Chronic anticoagulation 06/10/2015  . Hyperlipidemia 06/10/2015  . Essential hypertension 06/10/2015  . History of stroke 06/10/2015  . Coronary artery disease due to lipid rich plaque 06/10/2015  . Afib ([facility])   . Elevated uric acid in blood   . Arthritis of ankle   . Pulmonary HTN ([facility])     ,  PT,  DPT 03/30/2016, 7:27 AM  [facility] [address] [place], [place], [address] Phone: [phone]   Fax:  [phone]  Name: Jachelle [place] MRN: [id number] Date of Birth: 10/26/1927     

## 2016-03-31 ENCOUNTER — Ambulatory Visit: Payer: Medicare Other

## 2016-03-31 DIAGNOSIS — R2689 Other abnormalities of gait and mobility: Secondary | ICD-10-CM | POA: Diagnosis not present

## 2016-03-31 DIAGNOSIS — R29898 Other symptoms and signs involving the musculoskeletal system: Secondary | ICD-10-CM | POA: Diagnosis not present

## 2016-03-31 DIAGNOSIS — M6281 Muscle weakness (generalized): Secondary | ICD-10-CM | POA: Diagnosis not present

## 2016-03-31 DIAGNOSIS — R278 Other lack of coordination: Secondary | ICD-10-CM

## 2016-03-31 DIAGNOSIS — I69351 Hemiplegia and hemiparesis following cerebral infarction affecting right dominant side: Secondary | ICD-10-CM | POA: Diagnosis not present

## 2016-03-31 NOTE — Therapy (Signed)
[facility] [address] [place], [place], [address] Phone: [phone]   Fax:  [phone]  Physical Therapy Treatment  Patient Details  Name: Adrienne [name] MRN: [id number] Date of Birth: 14-Oct-1927 Referring Provider: Dr. Letta Pate  Encounter Date: 03/31/2016      PT End of Session - 03/31/16 1207    Visit Number 10   Number of Visits 17   Date for PT Re-Evaluation 04/10/16   Authorization Type G-code and progress note every 10th visit.   PT Start Time 1020   PT Stop Time 1059   PT Time Calculation (min) 39 min   Equipment Utilized During Treatment Gait belt   Activity Tolerance Patient tolerated treatment well   Behavior During Therapy WFL for tasks assessed/performed      Past Medical History  Diagnosis Date  . Afib ([place])   . Elevated uric acid in blood   . Arthritis of ankle   . Pulmonary HTN ([place])   . Long-term (current) use of anticoagulants   . Moderate mitral regurgitation   . CAD (coronary artery disease)   . Edema   . Cancer [facility]) 2012    breast cancer-right  . Hypertension   . Urinary incontinence   . TIA (transient ischemic attack) 09/2011  . Diverticulosis   . Compression fracture of T12 vertebra [facility])     Past Surgical History  Procedure Laterality Date  . Cardiac catheterization  2013  . Replacement total knee bilateral    . Gallbladder surgery    . Appendectomy    . Mastectomy Right   . Cataract extraction, bilateral    . Breast surgery  2012    Rt.mastectomy--[place], [place]  . Tonsillectomy and adenoidectomy      as a child  . Dilation and curettage of uterus      in her early 89's for DUB  . Esophagogastroduodenoscopy Left 01/08/2016    Procedure: ESOPHAGOGASTRODUODENOSCOPY (EGD);  Surgeon: Arta Silence, MD;  Location: [facility];  Service: Endoscopy;  Laterality: Left;  . Radiology with anesthesia N/A 01/25/2016    Procedure: RADIOLOGY WITH ANESTHESIA;  Surgeon: Luanne Bras, MD;  Location: [facility];  Service: Radiology;  Laterality: N/A;    There were no vitals filed for this visit.      Subjective Assessment - 03/31/16 1023    Subjective Pt reported Dr. Paulita Fujita had pt start taking a probiotic. Pt denied falls since last visit.    Pertinent History A-fib, pulmonary HTN, HTN, hx of R breast CA in 2012, Hx of T12 compression fx (within the last year) but denied precautions, R ankle OA, Hx of GI hemorrhage with diverticulosis, CAD   Patient Stated Goals Walk without any assistance   Currently in Pain? No/denies                         [facility] Adult PT Treatment/Exercise - 03/31/16 1025    Ambulation/Gait   Ambulation/Gait Yes   Ambulation/Gait Assistance 6: Modified independent (Device/Increase time);5: Supervision   Ambulation/Gait Assistance Details MOD I level over even terrain indoors and S during amb. with SPC for safety. Cues to improve breathing and stride length, and to look up. Pt slightly SOB, SaO2 on room air after amb. 96-97% and HR 65bpm. Pt demo'd improved safety and coordination with SPC vs. SPC with quad tip.   Ambulation Distance (Feet) 75 Feet  and 50'x2 c RW and 230' with SPC, 117' c SPC/quad tip  Assistive device Straight cane;Rolling walker  with quad tip on SPC   Gait Pattern Step-through pattern;Trendelenburg;Decreased stride length;Decreased step length - left   Ambulation Surface Level;Indoor   Gait velocity 2.50f/sec.  with RW   Standardized Balance Assessment   Standardized Balance Assessment Berg Balance Test;Timed Up and Go Test   Berg Balance Test   Sit to Stand Able to stand without using hands and stabilize independently   Standing Unsupported Able to stand safely 2 minutes   Sitting with Back Unsupported but Feet Supported on Floor or Stool Able to sit safely and securely 2 minutes   Stand to Sit Sits safely with minimal use of hands   Transfers Able to transfer safely, minor use of hands   Standing  Unsupported with Eyes Closed Able to stand 10 seconds safely   Standing Ubsupported with Feet Together Able to place feet together independently and stand 1 minute safely   From Standing, Reach Forward with Outstretched Arm Can reach confidently >25 cm (10")  10"   From Standing Position, Pick up Object from Floor Able to pick up shoe safely and easily   From Standing Position, Turn to Look Behind Over each Shoulder Looks behind one side only/other side shows less weight shift   Turn 360 Degrees Able to turn 360 degrees safely but slowly   Standing Unsupported, Alternately Place Feet on Step/Stool Able to complete 4 steps without aid or supervision   Standing Unsupported, One Foot in Front Able to plae foot ahead of the other independently and hold 30 seconds   Standing on One Leg Able to lift leg independently and hold equal to or more than 3 seconds  4 sec. L SLS   Total Score 48   Timed Up and Go Test   TUG Normal TUG   Normal TUG (seconds) 13.7  with RW                PT Education - 03/31/16 1206    Education provided Yes   Education Details PT discussed outcome measure progress and goal progress. PT discussed pt using SPC with counter support as needed, to practice using cane at home, but to use RW at all times.   Person(s) Educated Patient   Methods Explanation   Comprehension Verbalized understanding          PT Short Term Goals - 03/11/16 1127    PT SHORT TERM GOAL #1   Title Pt will be IND in HEP in order to improve balance and strength. Target date: 03/09/16   Baseline met    Status Achieved   PT SHORT TERM GOAL #2   Title Pt will improve BERG score to >/=47/56 to decr. falls risk. Target date: 03/09/16   Baseline 46/56 on 03/11/16    Status Partially Met   PT SHORT TERM GOAL #3   Title Pt will perform TUG with LRAD in </=13.5sec. to decr. falls risk. Target date: 03/09/16   Baseline 12.85 secs with RW   Status Achieved   PT SHORT TERM GOAL #4   Title Pt will  amb. 300' over even terrain with LRAD at MOD I level to improve functional mobilty. Target date: 03/09/16   Baseline mod I with RW, S with cane (provided education to begin gait with SPC at home with S from husband)   Status Achieved           PT Long Term Goals - 03/31/16 1208    PT LONG TERM GOAL #1  Title Pt will verbalize understanding of CVA symptoms/risk factors to decr. risk of additonal CVA. Target date: 04/06/16   Status On-going   PT LONG TERM GOAL #2   Title Pt will improve BERG score to >/=51/56 to decr. falls risk. Target date: 04/06/16   Status On-going   PT LONG TERM GOAL #3   Title Pt will be able to ascend/descend one step without handrail, with LRAD, at MOD I level to traverse step at home. Target date: 04/06/16   Status On-going   PT LONG TERM GOAL #4   Title Pt will amb. 500' over even/uneven terrain with LRAD at MOD I level to improve functional mobility. Target date: 04/06/16   Status On-going   PT LONG TERM GOAL #5   Title Pt will improve gait speed with SPC to >/=2.69f/sec. to safely amb. in the community. Target date: 04/06/16   Baseline Met 708-02-17with RW, so revised to [facility]  Status Revised               Plan - 008/02/17103/21/07   Clinical Impression Statement Pt demonstrated progress as she her BERG, TUG and gait speed all improved. However, pt's BERG score and TUG time still indicate pt is at risk for falls. Pt met LTG 5 with RW, PT will revise goal to improved gait speed with SPC. Continue with POC.    Rehab Potential Good   Clinical Impairments Affecting Rehab Potential co-morbidities   PT Frequency 2x / week   PT Duration 8 weeks   PT Treatment/Interventions ADLs/Self Care Home Management;Biofeedback;Canalith Repostioning;Vestibular;Manual techniques;Balance training;Therapeutic exercise;Therapeutic activities;Functional mobility training;Stair training;Gait training;DME Instruction;Patient/family education;Neuromuscular re-education;Orthotic  Fit/Training   PT Next Visit Plan advance BLE strengthening as needed, continue balance on compliant surfaces, EO/EC, continue gait with SPC as well (needs cues for breath control)   PT Home Exercise Plan balance and strengthening HEP   Consulted and Agree with Plan of Care Patient      Patient will benefit from skilled therapeutic intervention in order to improve the following deficits and impairments:  Abnormal gait, Decreased endurance, Decreased knowledge of use of DME, Postural dysfunction, Decreased balance, Decreased mobility  Visit Diagnosis: Other lack of coordination  Muscle weakness (generalized)  Other abnormalities of gait and mobility       G-Codes - [id number]   Functional Assessment Tool Used BERG: 46/56; gait speed with RW: 2.658fsec; TUG with RW: 13.7sec.   Functional Limitation Mobility: Walking and moving around   Mobility: Walking and Moving Around Current Status ([id number] least 20 percent but less than 40 percent impaired, limited or restricted   Mobility: Walking and Moving Around Goal Status ([id number] least 1 percent but less than 20 percent impaired, limited or restricted      Problem List Patient Active Problem List   Diagnosis Date Noted  . Diplopia 01/29/2016  . GERD (gastroesophageal reflux disease) 01/29/2016  . Left middle cerebral artery stroke (HCWell[facility]05/19/2017  . Persistent atrial fibrillation ([facility]  . Gastrointestinal hemorrhage associated with intestinal diverticulosis   . Gait disturbance, post-stroke 01/27/2016  . Fall   . Secondary hypertension, unspecified   . Cerebral infarction due to thrombosis of left middle cerebral artery (HCC) s/p mechanical thrombectomy   . Malnutrition of moderate degree 01/09/2016  . GI bleed 01/06/2016  . Chronic atrial fibrillation (HCBaylis09/28/2016  . Pulmonary hypertension, secondary (HCGrand Beach09/28/2016  . Chronic anticoagulation 06/10/2015  . Hyperlipidemia 06/10/2015  . Essential hypertension  06/10/2015  .  History of stroke 06/10/2015  . Coronary artery disease due to lipid rich plaque 06/10/2015  . Afib ([place])   . Elevated uric acid in blood   . Arthritis of ankle   . Pulmonary HTN ([place])     Signa Cheek L 03/31/2016, 12:10 PM  [facility] [address] [place], [place], [address] Phone: [phone]   Fax:  [phone]  Name: Adrienne [name] MRN: [id number] Date of Birth: [birth year]  Physical Therapy Progress Note  Dates of Reporting Period: 02/10/16  to 03/31/16  Objective Reports of Subjective Statement: Pt's outcome measure scores have all improved, and pt feels more confident during gait.  Objective Measurements: Please see above.  Goal Update:      PT Short Term Goals - 03/11/16 1127    PT SHORT TERM GOAL #1   Title Pt will be IND in HEP in order to improve balance and strength. Target date: 03/09/16   Baseline met    Status Achieved   PT SHORT TERM GOAL #2   Title Pt will improve BERG score to >/=47/56 to decr. falls risk. Target date: 03/09/16   Baseline 46/56 on 03/11/16    Status Partially Met   PT SHORT TERM GOAL #3   Title Pt will perform TUG with LRAD in </=13.5sec. to decr. falls risk. Target date: 03/09/16   Baseline 12.85 secs with RW   Status Achieved   PT SHORT TERM GOAL #4   Title Pt will amb. 300' over even terrain with LRAD at MOD I level to improve functional mobilty. Target date: 03/09/16   Baseline mod I with RW, S with cane (provided education to begin gait with SPC at home with S from husband)   Status Achieved         PT Long Term Goals - 03/31/16 1208    PT LONG TERM GOAL #1   Title Pt will verbalize understanding of CVA symptoms/risk factors to decr. risk of additonal CVA. Target date: 04/06/16   Status On-going   PT LONG TERM GOAL #2   Title Pt will improve BERG score to >/=51/56 to decr. falls risk. Target date: 04/06/16   Status On-going   PT LONG TERM GOAL #3    Title Pt will be able to ascend/descend one step without handrail, with LRAD, at MOD I level to traverse step at home. Target date: 04/06/16   Status On-going   PT LONG TERM GOAL #4   Title Pt will amb. 500' over even/uneven terrain with LRAD at MOD I level to improve functional mobility. Target date: 04/06/16   Status On-going   PT LONG TERM GOAL #5   Title Pt will improve gait speed with SPC to >/=2.31f/sec. to safely amb. in the community. Target date: 04/06/16   Baseline Met 03/31/16 with RW, so [facility] to [facility]  Status Revised       Plan: Continue balance, strength, and endurance training.  Reason Skilled Services are Required: To improve safety during functional mobility.    JGeoffry Paradise PT,DPT 03/31/2016 12:10 PM Phone: [phone]: [phone]

## 2016-04-04 DIAGNOSIS — N3946 Mixed incontinence: Secondary | ICD-10-CM | POA: Diagnosis not present

## 2016-04-04 DIAGNOSIS — N3020 Other chronic cystitis without hematuria: Secondary | ICD-10-CM | POA: Diagnosis not present

## 2016-04-05 ENCOUNTER — Ambulatory Visit: Payer: Medicare Other

## 2016-04-05 DIAGNOSIS — M6281 Muscle weakness (generalized): Secondary | ICD-10-CM | POA: Diagnosis not present

## 2016-04-05 DIAGNOSIS — R29898 Other symptoms and signs involving the musculoskeletal system: Secondary | ICD-10-CM | POA: Diagnosis not present

## 2016-04-05 DIAGNOSIS — R2689 Other abnormalities of gait and mobility: Secondary | ICD-10-CM | POA: Diagnosis not present

## 2016-04-05 DIAGNOSIS — D509 Iron deficiency anemia, unspecified: Secondary | ICD-10-CM | POA: Diagnosis not present

## 2016-04-05 DIAGNOSIS — I69351 Hemiplegia and hemiparesis following cerebral infarction affecting right dominant side: Secondary | ICD-10-CM | POA: Diagnosis not present

## 2016-04-05 DIAGNOSIS — M4854XA Collapsed vertebra, not elsewhere classified, thoracic region, initial encounter for fracture: Secondary | ICD-10-CM | POA: Diagnosis not present

## 2016-04-05 DIAGNOSIS — R110 Nausea: Secondary | ICD-10-CM | POA: Diagnosis not present

## 2016-04-05 DIAGNOSIS — R278 Other lack of coordination: Secondary | ICD-10-CM | POA: Diagnosis not present

## 2016-04-05 NOTE — Therapy (Signed)
[facility] [address] [place], [place], [address] Phone: [phone]   Fax:  [phone]  Physical Therapy Treatment  Patient Details  Name: Adrienne [name] MRN: [id number] Date of Birth: 1928/01/29 Referring Provider: Dr. Letta Pate  Encounter Date: 04/05/2016      PT End of Session - 04/05/16 1025    Visit Number 11   Number of Visits 17   Date for PT Re-Evaluation 04/10/16   Authorization Type G-code and progress note every 10th visit.   PT Start Time 1020   PT Stop Time 1100   PT Time Calculation (min) 40 min   Equipment Utilized During Treatment Gait belt   Activity Tolerance Patient tolerated treatment well   Behavior During Therapy WFL for tasks assessed/performed      Past Medical History:  Diagnosis Date  . Afib ([place])   . Arthritis of ankle   . CAD (coronary artery disease)   . Cancer [facility]) 2012   breast cancer-right  . Compression fracture of T12 vertebra (H[facility])   . Diverticulosis   . Edema   . Elevated uric acid in blood   . Hypertension   . Long-term (current) use of anticoagulants   . Moderate mitral regurgitation   . Pulmonary HTN ([place])   . TIA (transient ischemic attack) 09/2011  . Urinary incontinence     Past Surgical History:  Procedure Laterality Date  . APPENDECTOMY    . BREAST SURGERY  2012   Rt.mastectomy--[place], [place]  2013  . CATARACT EXTRACTION, BILATERAL    . DILATION AND CURETTAGE OF UTERUS     in her early 41's for DUB  . ESOPHAGOGASTRODUODENOSCOPY Left 01/08/2016   Procedure: ESOPHAGOGASTRODUODENOSCOPY (EGD);  Surgeon: Arta Silence, MD;  [facility];  Service: Endoscopy;  Laterality: Left;  . GALLBLADDER SURGERY    . MASTECTOMY Right   . RADIOLOGY WITH ANESTHESIA N/A 01/25/2016   Procedure: RADIOLOGY WITH ANESTHESIA;  Surgeon: Luanne Bras, MD;  Location: [facility];  Service: Radiology;  Laterality: N/A;  . REPLACEMENT TOTAL  KNEE BILATERAL    . TONSILLECTOMY AND ADENOIDECTOMY     as a child    There were no vitals filed for this visit.      Subjective Assessment - 04/05/16 1024    Subjective No new complaints. No falls or pain to report. MD did change her maintance antibiotic, will bring it in next visit.   Pertinent History A-fib, pulmonary HTN, HTN, hx of R breast CA in 2012, Hx of T12 compression fx (within the last year) but denied precautions, R ankle OA, Hx of GI hemorrhage with diverticulosis, CAD   Patient Stated Goals Walk without any assistance   Currently in Pain? No/denies   Pain Score 0-No pain       PTA: Adrienne [name] completed pt's subjective portion and completed first vitals measurement.                  [facility] Adult PT Treatment/Exercise - 04/05/16 1026      Ambulation/Gait   Ambulation/Gait Yes   Ambulation/Gait Assistance 4: Min guard;5: Supervision;6: Modified independent (Device/Increase time)  MOD I level with RW   Ambulation/Gait Assistance Details HR 61, SaO2 96% RA before gait with straight cane.  SaO2 on room air after 230' of amb. 96% with slight SOB reported and HR 69-70bpm. Pt amb. While performing head turns. Pt required seated rest breaks 2/2 fatigue and slight SOB. Cues for sequencing and looking 10'  ahead vs. straight down and prrogressing as tolerated to looking straight ahead. BP after second bout of amb: 116/64 and HR: 55bpm.   Ambulation Distance (Feet) 230 Feet  345' and 230' with SPC, 5' with RW   Assistive device Straight cane;Rolling walker   Gait Pattern Step-through pattern;Trendelenburg;Decreased stride length;Decreased step length - left   Ambulation Surface Level;Indoor   Ramp 5: Supervision  to min guard   Ramp Details (indicate cue type and reason) Cues for sequencing performed x2.   Curb 4: Min assist;Other (comment)  min guard   Curb Details (indicate cue type and reason) Cues for sequencing and min A to ascend curb. x2 reps     Knee/Hip  Exercises: Standing   Forward Step Up Both;2 sets;10 reps;Hand Hold: 1;Hand Hold: 0;Step Height: 4"   Forward Step Up Limitations Cues for improved ant. wt. shifting. Pt performed x10 reps/LE with 1 UE support and x10 reps with 0 UE support.                 PT Education - 04/05/16 1541    Education provided Yes   Education Details PT discussed assessing goals next session and will either d/c or renew based on progress. Pt states she's very glad she is in therapy and feels likes she's getting better. PT educated pt on proper AD height.   Person(s) Educated Patient   Methods Explanation   Comprehension Verbalized understanding          PT Short Term Goals - 03/11/16 1127      PT SHORT TERM GOAL #1   Title Pt will be IND in HEP in order to improve balance and strength. Target date: 03/09/16   Baseline met    Status Achieved     PT SHORT TERM GOAL #2   Title Pt will improve BERG score to >/=47/56 to decr. falls risk. Target date: 03/09/16   Baseline 46/56 on 03/11/16    Status Partially Met     PT SHORT TERM GOAL #3   Title Pt will perform TUG with LRAD in </=13.5sec. to decr. falls risk. Target date: 03/09/16   Baseline 12.85 secs with RW   Status Achieved     PT SHORT TERM GOAL #4   Title Pt will amb. 300' over even terrain with LRAD at MOD I level to improve functional mobilty. Target date: 03/09/16   Baseline mod I with RW, S with cane (provided education to begin gait with SPC at home with S from husband)   Status Achieved           PT Long Term Goals - 03/31/16 1208      PT LONG TERM GOAL #1   Title Pt will verbalize understanding of CVA symptoms/risk factors to decr. risk of additonal CVA. Target date: 04/06/16   Status On-going     PT LONG TERM GOAL #2   Title Pt will improve BERG score to >/=51/56 to decr. falls risk. Target date: 04/06/16   Status On-going     PT LONG TERM GOAL #3   Title Pt will be able to ascend/descend one step without handrail, with  LRAD, at MOD I level to traverse step at home. Target date: 04/06/16   Status On-going     PT LONG TERM GOAL #4   Title Pt will amb. 500' over even/uneven terrain with LRAD at MOD I level to improve functional mobility. Target date: 04/06/16   Status On-going     PT LONG TERM  GOAL #5   Title Pt will improve gait speed with SPC to >/=2.55f/sec. to safely amb. in the community. Target date: 04/06/16   Baseline Met 03/31/16 with RW, so [facility]  Status Revised               Plan - 04/05/16 1542    Clinical Impression Statement Pt demonstrated progress as she was able to progress to dynamic gait activities with less assist. Pt experienced decr. B quad strength while traversing curb, and would continue to benefit from strength and gait training. Continue with POC.    Rehab Potential Good   Clinical Impairments Affecting Rehab Potential co-morbidities   PT Frequency 2x / week   PT Duration 8 weeks   PT Treatment/Interventions ADLs/Self Care Home Management;Biofeedback;Canalith Repostioning;Vestibular;Manual techniques;Balance training;Therapeutic exercise;Therapeutic activities;Functional mobility training;Stair training;Gait training;DME Instruction;Patient/family education;Neuromuscular re-education;Orthotic Fit/Training   PT Next Visit Plan Assess goals and either d/c or renew based on progress.   PT Home Exercise Plan balance and strengthening HEP   Consulted and Agree with Plan of Care Patient      Patient will benefit from skilled therapeutic intervention in order to improve the following deficits and impairments:  Abnormal gait, Decreased endurance, Decreased knowledge of use of DME, Postural dysfunction, Decreased balance, Decreased mobility  Visit Diagnosis: Other abnormalities of gait and mobility  Muscle weakness (generalized)     Problem List Patient Active Problem List   Diagnosis Date Noted  . Diplopia 01/29/2016  . GERD (gastroesophageal reflux disease)  01/29/2016  . Left middle cerebral artery stroke (HHond[place] 01/29/2016  . Persistent atrial fibrillation (HGil[place]   . Gastrointestinal hemorrhage associated with intestinal diverticulosis   . Gait disturbance, post-stroke 01/27/2016  . Fall   . Secondary hypertension, unspecified   . Cerebral infarction due to thrombosis of left middle cerebral artery (HCC) s/p mechanical thrombectomy   . Malnutrition of moderate degree 01/09/2016  . GI bleed 01/06/2016  . Chronic atrial fibrillation ([place] 06/10/2015  . Pulmonary hypertension, secondary (HLincoln 06/10/2015  . Chronic anticoagulation 06/10/2015  . Hyperlipidemia 06/10/2015  . Essential hypertension 06/10/2015  . History of stroke 06/10/2015  . Coronary artery disease due to lipid rich plaque 06/10/2015  . Afib ([place]   . Elevated uric acid in blood   . Arthritis of ankle   . Pulmonary HTN ([facility]     Halee Glynn L 04/05/2016, 3:45 PM  [place] [address]Phone: [phone]  Fax:  [phone] Name: BBrandalyn HartingMRN: [id number] of Birth: 404/24/1929  JGeoffry Paradise PT,DPT 04/05/16 3:45 PM Phone: [phone]: [phone]

## 2016-04-06 ENCOUNTER — Ambulatory Visit (INDEPENDENT_AMBULATORY_CARE_PROVIDER_SITE_OTHER): Payer: Medicare Other | Admitting: Neurology

## 2016-04-06 ENCOUNTER — Encounter: Payer: Self-pay | Admitting: Neurology

## 2016-04-06 VITALS — BP 130/80 | HR 65 | Ht 62.0 in | Wt 145.6 lb

## 2016-04-06 DIAGNOSIS — E785 Hyperlipidemia, unspecified: Secondary | ICD-10-CM | POA: Diagnosis not present

## 2016-04-06 DIAGNOSIS — I639 Cerebral infarction, unspecified: Secondary | ICD-10-CM

## 2016-04-06 DIAGNOSIS — K922 Gastrointestinal hemorrhage, unspecified: Secondary | ICD-10-CM

## 2016-04-06 DIAGNOSIS — I482 Chronic atrial fibrillation, unspecified: Secondary | ICD-10-CM

## 2016-04-06 DIAGNOSIS — Z7901 Long term (current) use of anticoagulants: Secondary | ICD-10-CM | POA: Diagnosis not present

## 2016-04-06 DIAGNOSIS — I63412 Cerebral infarction due to embolism of left middle cerebral artery: Secondary | ICD-10-CM | POA: Diagnosis not present

## 2016-04-06 NOTE — Progress Notes (Signed)
STROKE NEUROLOGY FOLLOW UP NOTE  NAME: Adrienne [name] First DOB: 1928/06/27  REASON FOR VISIT: stroke follow up HISTORY FROM: pt and husband and chart  Today we had Adrienne [name] pleasure of seeing Adrienne [name] [name] in follow-up at our Neurology Clinic. Pt was accompanied by husband.   History Summary Adrienne [name] [name] is a 82 y.o. female with history of atrial fibrillation on coumadin with GIB 3 weeks PTA was admitted on 01/25/16 for R sided weakness and receptive phasia following a fall. Adrienne [name] [name] did not receive IV t-PA per documenation due to hx GIB. INR 1.5 on arrival. CTA showed L MCA bifurcation occlusion, left ICA siphon moderate plaque, and intracranial stenosis b/l ACA, R MCA, L PCA branches. CTP showed penumbra. Adrienne [name] [name] was taken to IR with complete TICI 3 revascularization of L MCA with Solitaire and IA integrelin. MRI showed only a small volume of Adrienne [name] left MCA territory infarct. EF 60-65% LDL 80 and A1C 5.6. Her coumadin switched to eliquis. H&H stable no GIB during admission. Adrienne [name] [name] was discharged to CIR with eliquis and lipitor 20.   Interval History During Adrienne [name] interval time, Adrienne [name] [name] has been doing well. Adrienne [name] [name] had full recovery and d/c from CIR to home. Adrienne [name] [name] followed with cardiology and lipitor stopped due to mild side effects of myalgia. On cardizem and metoprolol of rate control. On eliquis without bleeding side effects. BP 130/80.      REVIEW OF SYSTEMS: Full 14 system review of systems performed and notable only for those listed below and in HPI above, all others are negative:  Constitutional:  Appetite change Cardiovascular: leg swelling Ear/Nose/Throat:   Skin:  Eyes:   Respiratory:  SOB Gastroitestinal:  diarrhea Genitourinary: incontinence of bladder, frequency of urination Hematology/Lymphatic:   Endocrine:  Musculoskeletal:   Allergy/Immunology:  Frequent infection Neurological:  Memory loss Psychiatric:  Sleep:   Adrienne [name] following represents Adrienne [name] [name]'s updated allergies and side  effects list: Allergies  Allergen Reactions  . Amiodarone Hcl [Amiodarone]     AFIB  . Compazine [Prochlorperazine]     HYPER  . Other Other (See Comments)    "Kidney dye"--[name] states this gave her severe shakes/chills  . Thorazine [Chlorpromazine]     HYPER  . Valium [Diazepam]     HYPER   . Zometa [Zoledronic Acid]     PROBLEMS WITH EYES    Adrienne [name] neurologically relevant items on Adrienne [name] [name]'s problem list were reviewed on today's visit.  Neurologic Examination  A problem focused neurological exam (12 or more points of Adrienne [name] single system neurologic examination, vital signs counts as 1 point, cranial nerves count for 8 points) was performed.  Blood pressure 130/80, pulse 65, height 5\' 2"  (1.575 m), weight 145 lb 9.6 oz (66 kg), last menstrual period 09/12/1973.  General - Well nourished, well developed, in no apparent distress.  Ophthalmologic - Sharp disc margins OU.  Cardiovascular - irregularly irregular heart rate and rhythm.  Mental Status -  Level of arousal and orientation to time, place, and person were intact. Language including expression, naming, repetition, comprehension was assessed and found intact. Fund of Knowledge was assessed and was intact.  Cranial Nerves II - XII - II - Visual field intact OU. III, IV, VI - Extraocular movements intact. V - Facial sensation intact bilaterally. VII - Facial movement intact bilaterally. VIII - Hearing & vestibular intact bilaterally. X - Palate elevates symmetrically. XI - Chin turning & shoulder shrug intact bilaterally. XII - Tongue protrusion intact.  Motor Strength - Adrienne [name] [name]'s strength was  normal in all extremities and pronator drift was absent.  Bulk was normal and fasciculations were absent.   Motor Tone - Muscle tone was assessed at Adrienne [name] neck and appendages and was normal.  Reflexes - Adrienne [name] [name]'s reflexes were 1+ in all extremities and Adrienne [name] [name] had no pathological reflexes.  Sensory - Light touch,  temperature/pinprick were assessed and were normal.    Coordination - Adrienne [name] [name] had normal movements in Adrienne [name] hands and feet with no ataxia or dysmetria.  Tremor was absent.  Gait and Station - Adrienne [name] [name]'s transfers, posture, gait, station, and turns were observed as normal.   Functional score  mRS = 0   0 - No symptoms.   1 - No significant disability. Able to carry out all usual activities, despite some symptoms.   2 - Slight disability. Able to look after own affairs without assistance, but unable to carry out all previous activities.   3 - Moderate disability. Requires some help, but able to walk unassisted.   4 - Moderately severe disability. Unable to attend to own bodily needs without assistance, and unable to walk unassisted.   5 - Severe disability. Requires constant nursing care and attention, bedridden, incontinent.   6 - Dead.   NIH Stroke Scale = 0   Data reviewed: I personally reviewed Adrienne [name] images and agree with Adrienne [name] radiology interpretations.  Ct Head Wo Contrast 01/25/2016 Early changes of acute posterior left MCA infarct. No parenchymal hematoma. 01/25/2016 Atrophy with small vessel disease in Adrienne [name] periventricular white matter. Prior infarcts as noted above. No acute appearing infarct is evident. No hemorrhage or mass effect. ADDENDUM Left hemisphere ASPECTS score = 9; small area of cortical hypodensity in Adrienne [name] left M2 as seen on series 21, image 14.had given verbal preliminary ASPECTS score of "10"   Ct Angio Head & Neck W/cm &/or Wo/cm 01/25/2016 1. Positive for Emergent Large Vessel Occlusion at Adrienne [name] left MCA bifurcation. Abnormal left MCA perfusion with noncontrast head CT and CT Brain Perfusion parameters favorable for clot retrieval. 2. Superimposed atherosclerotic stenosis at Adrienne [name] left ICA origin (60-65%) and left supraclinoid ICA siphon (moderate) related to calcified plaque. 3. Superimposed mild to moderate second and third order branch intracranial  atherosclerosis including in Adrienne [name] bilateral ACA, right MCA, and left PCA branches. 4. Right carotid bifurcation calcified plaque without stenosis.   Ct C-spine No Charge 01/25/2016 Multilevel degenerative change without acute abnormality.   Ct Cerebral Perfusion W/cm 01/25/2016 1. Positive for Emergent Large Vessel Occlusion at Adrienne [name] left MCA bifurcation. Abnormal left MCA perfusion with noncontrast head CT and CT Brain Perfusion parameters favorable for clot retrieval.   Cerebral angiogram 01/25/2016 bilateral common carotid arteriograms followed by complete revascularization of Occluded Lt MCA With x 1 pass with Solitaire 4 mmx 40 mm Retrieval device and 4 mg of superselective IA integrelin with TICI 3 revascularization.  MRI Brain Wo Contrast 01/27/2016 Only a small volume of left MCA territory infarction occurred, primarily along Adrienne [name] left sylvian fissure. No hemorrhage or mass effect. 2. Occasional other punctate areas of restricted diffusion over both superior convexities, probably neuro-intervention related.  CXR 01/26/2016 No active disease. Probable chronic mild interstitial prominence. Endotracheal and NG tube in place. No pneumothorax.  2D echocardiogram - Left ventricle: Adrienne [name] cavity size was normal. Systolic function was  normal. Adrienne [name] estimated ejection fraction was in Adrienne [name] range of 60% to 65%. Wall motion was normal; there were no regional wall motion abnormalities. Adrienne [name] study is not technically sufficient to allow evaluation of LV diastolic  function. - Aortic valve: Valve mobility was mildly restricted. Transvalvular velocity was within Adrienne [name] normal range. There was no stenosis. There was trivial regurgitation. - Mitral valve: Calcified annulus. Transvalvular velocity was within Adrienne [name] normal range. There was no evidence for stenosis. There was trivial regurgitation. - Left atrium: Adrienne [name] atrium was moderately dilated. - Right ventricle: Adrienne [name] cavity size was normal. Wall thickness was normal.  Systolic function was normal. - Atrial septum: No defect or patent foramen ovale was identified by color flow Doppler. - Tricuspid valve: There was mild regurgitation. - Pulmonary arteries: Systolic pressure was within Adrienne [name] normal range. PA peak pressure: 29 mm Hg (S).  Assessment: As you may recall, Adrienne [name] [name] is a 82 y.o. Caucasian female with PMH of  atrial fibrillation on coumadin with GIB 3 weeks PTA was admitted on 01/25/16 for R sided weakness and receptive phasia following a fall. Adrienne [name] [name] did not receive IV t-PA per documenation due to hx GIB. INR 1.5 on arrival. CTA showed L MCA bifurcation occlusion, left ICA siphon moderate plaque, and intracranial stenosis b/l ACA, R MCA, L PCA branches. CTP showed penumbra. Adrienne [name] [name] was taken to IR with complete TICI 3 revascularization of L MCA with Solitaire and IA integrelin. MRI showed only a small volume of Adrienne [name] left MCA territory infarct. EF 60-65% LDL 80 and A1C 5.6. Her coumadin switched to eliquis. H&H stable no GIB. Adrienne [name] [name] was discharged to CIR with eliquis and lipitor 20. Adrienne [name] [name] had full recovery and d/c from CIR to home. Adrienne [name] [name] followed with cardiology and lipitor stopped due to mild side effects of myalgia. On cardizem and metoprolol of rate control. On eliquis without bleeding side effects.   Plan:  - continue eliquis for stroke prevention. - check BP at home and record.  - follow up with cardiology for afib and consider to try other statins if needed. - Follow up with your primary care physician for stroke risk factor modification. Recommend maintain blood pressure goal <130/80, diabetes with hemoglobin A1c goal below 7.0% and lipids with LDL cholesterol goal below 70 mg/dL.  - continue outpt PT/OT. - healthy diet and regular exercise and avoid fall - follow up in 4 months.  I spent more than 25 minutes of face to face time with Adrienne [name] [name]. Greater than 50% of time was spent in counseling and coordination of care. We discussed bleeding precautions, statin use and  side effects, and outpt PT/OT.   No orders of Adrienne [name] defined types were placed in this encounter.   Meds ordered this encounter  Medications  . Multiple Vitamins-Minerals (MULTIVITAMIN ADULT PO)    Sig: Take by mouth.  . vitamin C (ASCORBIC ACID) 500 MG tablet    Sig: Take 500 mg by mouth daily. Takes  2 dailu  . loperamide (IMODIUM) 2 MG capsule    Sig: Take by mouth as needed for diarrhea or loose stools.    [name] Instructions  - continue eliquis for stroke prevention. - check BP at home and record.  - follow up with cardiology for afib and consider to try other statins if needed. - Follow up with your primary care physician for stroke risk factor modification. Recommend maintain blood pressure goal <130/80, diabetes with hemoglobin A1c goal below 7.0% and lipids with LDL cholesterol goal below 70 mg/dL.  - continue outpt PT/OT. - healthy diet and regular exercise and avoid fall - follow up in 4 months.   Rosalin Hawking, MD PhD [facility] [address], [place], [place] [address] [phone]

## 2016-04-06 NOTE — Patient Instructions (Signed)
-   continue eliquis for stroke prevention. - check BP at home and record.  - follow up with cardiology for afib and consider to try other statins if needed. - Follow up with your primary care physician for stroke risk factor modification. Recommend maintain blood pressure goal <130/80, diabetes with hemoglobin A1c goal below 7.0% and lipids with LDL cholesterol goal below 70 mg/dL.  - continue outpt PT/OT. - healthy diet and regular exercise and avoid fall - follow up in 4 months.

## 2016-04-07 ENCOUNTER — Telehealth: Payer: Self-pay | Admitting: Emergency Medicine

## 2016-04-07 ENCOUNTER — Ambulatory Visit: Payer: Medicare Other

## 2016-04-07 DIAGNOSIS — R278 Other lack of coordination: Secondary | ICD-10-CM | POA: Diagnosis not present

## 2016-04-07 DIAGNOSIS — M6281 Muscle weakness (generalized): Secondary | ICD-10-CM

## 2016-04-07 DIAGNOSIS — I69351 Hemiplegia and hemiparesis following cerebral infarction affecting right dominant side: Secondary | ICD-10-CM | POA: Diagnosis not present

## 2016-04-07 DIAGNOSIS — R2689 Other abnormalities of gait and mobility: Secondary | ICD-10-CM | POA: Diagnosis not present

## 2016-04-07 DIAGNOSIS — R29898 Other symptoms and signs involving the musculoskeletal system: Secondary | ICD-10-CM | POA: Diagnosis not present

## 2016-04-07 NOTE — Therapy (Signed)
[facility] [address] [place], [place], [address] Phone: [phone]   Fax:  [phone]  Physical Therapy Treatment  Patient Details  Name: Adrienne [name] MRN: [id number] Date of Birth: 1928-02-15 Referring Provider: Dr. Letta Pate  Encounter Date: 04/07/2016      PT End of Session - 04/07/16 1159    Visit Number 12   Number of Visits 17   Date for PT Re-Evaluation 04/10/16   Authorization Type G-code and progress note every 10th visit.   PT Start Time 1021   PT Stop Time 1059   PT Time Calculation (min) 38 min   Equipment Utilized During Treatment Gait belt   Activity Tolerance Patient tolerated treatment well   Behavior During Therapy WFL for tasks assessed/performed      Past Medical History:  Diagnosis Date  . Afib ([place])   . Arthritis of ankle   . CAD (coronary artery disease)   . Cancer [facility]) 2012   breast cancer-right  . Compression fracture of T12 vertebra ([facility])   . Diverticulosis   . Edema   . Elevated uric acid in blood   . Hypertension   . Long-term (current) use of anticoagulants   . Moderate mitral regurgitation   . Pulmonary HTN ([place])   . Stroke ([place])   . TIA (transient ischemic attack) 09/2011  . Urinary incontinence     Past Surgical History:  Procedure Laterality Date  . APPENDECTOMY    . BREAST SURGERY  2012   Rt.mastectomy--[place], [place]  2013  . CATARACT EXTRACTION, BILATERAL    . DILATION AND CURETTAGE OF UTERUS     in her early 60's for DUB  . ESOPHAGOGASTRODUODENOSCOPY Left 01/08/2016   Procedure: ESOPHAGOGASTRODUODENOSCOPY (EGD);  Surgeon: Arta Silence, MD;  Location: [facility];  Service: Endoscopy;  Laterality: Left;  . GALLBLADDER SURGERY    . MASTECTOMY Right   . RADIOLOGY WITH ANESTHESIA N/A 01/25/2016   Procedure: RADIOLOGY WITH ANESTHESIA;  Surgeon: Luanne Bras, MD;  Location: [facility];  Service: Radiology;  Laterality: N/A;  .  REPLACEMENT TOTAL KNEE BILATERAL    . TONSILLECTOMY AND ADENOIDECTOMY     as a child    There were no vitals filed for this visit.      Subjective Assessment - 04/07/16 1024    Subjective Pt denied falls since last visit. Pt reported T12 compression fx site is a little sore today (especially during movement).    Pertinent History A-fib, pulmonary HTN, HTN, hx of R breast CA in 2012, Hx of T12 compression fx (within the last year) but denied precautions, R ankle OA, Hx of GI hemorrhage with diverticulosis, CAD   Patient Stated Goals Walk without any assistance   Currently in Pain? Yes   Pain Score 4    Pain Location Back   Pain Orientation Mid;Lower   Pain Descriptors / Indicators Aching   Pain Type Chronic pain   Pain Onset More than a month ago   Pain Frequency Intermittent   Aggravating Factors  movement   Pain Relieving Factors rest                         [facility] Adult PT Treatment/Exercise - 04/07/16 1026      Ambulation/Gait   Ambulation/Gait Yes   Ambulation/Gait Assistance 4: Min guard;5: Supervision;6: Modified independent (Device/Increase time)   Ambulation/Gait Assistance Details RW MOD I level and min guard to S with SPC. Pt  required seated rest breaks 2/2 fatigue. Cues for improved stride length and upright posture.    Ambulation Distance (Feet) 345 Feet  indoors and 200' outdoors [facility] and 52' with RW   Assistive device Straight cane;Rolling walker   Gait Pattern Step-through pattern;Trendelenburg;Decreased stride length;Decreased step length - left   Ambulation Surface Level;Indoor;Unlevel;Outdoor;Paved   Gait velocity 2.52f/sec.   Ramp 5: Supervision   Ramp Details (indicate cue type and reason) No LOB and proper technique noted.   Curb 4: Min assist;Other (comment)   Curb Details (indicate cue type and reason) min guard to descend and min A to ascend and cues for sequencing.     Standardized Balance Assessment   Standardized Balance  Assessment Berg Balance Test;Timed Up and Go Test     Berg Balance Test   Sit to Stand Able to stand without using hands and stabilize independently   Standing Unsupported Able to stand safely 2 minutes   Sitting with Back Unsupported but Feet Supported on Floor or Stool Able to sit safely and securely 2 minutes   Stand to Sit Sits safely with minimal use of hands   Transfers Able to transfer safely, minor use of hands   Standing Unsupported with Eyes Closed Able to stand 10 seconds safely   Standing Ubsupported with Feet Together Able to place feet together independently and stand 1 minute safely   From Standing, Reach Forward with Outstretched Arm Can reach confidently >25 cm (10")   From Standing Position, Pick up Object from Floor Able to pick up shoe safely and easily   From Standing Position, Turn to Look Behind Over each Shoulder Looks behind one side only/other side shows less weight shift   Turn 360 Degrees Able to turn 360 degrees safely one side only in 4 seconds or less   Standing Unsupported, Alternately Place Feet on Step/Stool Able to complete 4 steps without aid or supervision   Standing Unsupported, One Foot in Front Able to plae foot ahead of the other independently and hold 30 seconds  Required min guard to stand tandem for 30 sec.   Standing on One Leg Able to lift leg independently and hold equal to or more than 3 seconds  3sec. on R LE and 1-2 sec. on L LE   Total Score 49                PT Education - 04/07/16 1158    Education provided Yes   Education Details PT discussed notifying MD of incr. back pain. PT also educated pt on the goal progress, and renewal for 4 weeks to maximize functional potential, pt agreeable. Pt able to name CVA signs/sx's.   Person(s) Educated Patient   Methods Explanation   Comprehension Verbalized understanding          PT Short Term Goals - 03/11/16 1127      PT SHORT TERM GOAL #1   Title Pt will be IND in HEP in order  to improve balance and strength. Target date: 03/09/16   Baseline met    Status Achieved     PT SHORT TERM GOAL #2   Title Pt will improve BERG score to >/=47/56 to decr. falls risk. Target date: 03/09/16   Baseline 46/56 on 03/11/16    Status Partially Met     PT SHORT TERM GOAL #3   Title Pt will perform TUG with LRAD in </=13.5sec. to decr. falls risk. Target date: 03/09/16   Baseline 12.85 secs with RW  Status Achieved     PT SHORT TERM GOAL #4   Title Pt will amb. 300' over even terrain with LRAD at MOD I level to improve functional mobilty. Target date: 03/09/16   Baseline mod I with RW, S with cane (provided education to begin gait with SPC at home with S from husband)   Status Achieved           PT Long Term Goals - 04/07/16 1201      PT LONG TERM GOAL #1   Title Pt will verbalize understanding of CVA symptoms/risk factors to decr. risk of additonal CVA. Target date: 04/06/16   Baseline All unmet goals will be carried over to new POCl 05/05/16   Status On-going     PT LONG TERM GOAL #2   Title Pt will improve BERG score to >/=51/56 to decr. falls risk. Target date: 04/06/16   Status Partially Met     PT LONG TERM GOAL #3   Title Pt will be able to ascend/descend one step without handrail, with LRAD, at MOD I level to traverse step at home. Target date: 04/06/16   Status Partially Met     PT LONG TERM GOAL #4   Title Pt will amb. 500' over even/uneven terrain with LRAD at MOD I level to improve functional mobility. Target date: 04/06/16   Status Partially Met     PT LONG TERM GOAL #5   Title Pt will improve gait speed with SPC to >/=2.36f/sec. to safely amb. in the community. Target date: 04/06/16   Baseline Met 03/31/16 [facility] RW, [facility]  Status Partially Met     Additional Long Term Goals   Additional Long Term Goals Yes     PT LONG TERM GOAL #6   Title Perform 6MWT and write goal. Target date: 05/05/16   Status New               Plan - 04/07/16  1159    Clinical Impression Statement Pt met LTG 1 and partially met LTGs 2-5. Pt would continue to benefit from skilled PT to maximize functional gains and endurance. Pt limitedy by decr. B LE weakness, impaired balance, and decr. endurance. PT requesting additional 2x/week for 4 weeks.   Rehab Potential Good   Clinical Impairments Affecting Rehab Potential co-morbidities   PT Frequency 2x / week   PT Duration 4 weeks  original POC 2x/week for 8 weeks   PT Treatment/Interventions ADLs/Self Care Home Management;Biofeedback;Canalith Repostioning;Vestibular;Manual techniques;Balance training;Therapeutic exercise;Therapeutic activities;Functional mobility training;Stair training;Gait training;DME Instruction;Patient/family education;Neuromuscular re-education;Orthotic Fit/Training   PT Next Visit Plan Perform 6MWT and write goal and cont. gait training with SPC.   PT Home Exercise Plan balance and strengthening HEP   Consulted and Agree with Plan of Care Patient      Patient will benefit from skilled therapeutic intervention in order to improve the following deficits and impairments:  Abnormal gait, Decreased endurance, Decreased knowledge of use of DME, Postural dysfunction, Decreased balance, Decreased mobility  Visit Diagnosis: Other abnormalities of gait and mobility  Muscle weakness (generalized)  Other lack of coordination     Problem List Patient Active Problem List   Diagnosis Date Noted  . Diplopia 01/29/2016  . GERD (gastroesophageal reflux disease) 01/29/2016  . Cerebrovascular accident (CVA) due to embolism of left middle cerebral artery ([facility] [place] 01/29/2016  . Persistent atrial fibrillation ([place]   . Bleeding gastrointestinal   . Gait disturbance, post-stroke 01/27/2016  . Fall   . Secondary  hypertension, unspecified   . Cerebral infarction due to thrombosis of left middle cerebral artery (HCC) s/p mechanical thrombectomy   . Malnutrition of moderate degree 01/09/2016  .  GI bleed 01/06/2016  . Chronic atrial fibrillation ([place]) 06/10/2015  . Pulmonary hypertension, secondary ([place]) 06/10/2015  . Chronic anticoagulation 06/10/2015  . HLD (hyperlipidemia) 06/10/2015  . Essential hypertension 06/10/2015  . History of stroke 06/10/2015  . Coronary artery disease due to lipid rich plaque 06/10/2015  . Afib ([place])   . Elevated uric acid in blood   . Arthritis of ankle   . Pulmonary HTN ([place])     Gal Smolinski L 04/07/2016, 12:03 PM  [facility] [address] [place], [place], [address] Phone: [phone]   Fax:  [phone]  Name: Dazja Houchin MRN: [id number] Date of Birth: [birth year]   Geoffry Paradise, PT,DPT 04/07/16 12:03 PM Phone: [phone] Fax: [phone]

## 2016-04-07 NOTE — Telephone Encounter (Signed)
Patient called and stated that she removed her pessary a few days ago and noticed blood on pessary. No notices blood with toilet tissue after she voids. No pain with urination. No heavy bleeding or abdominal pain.  Her pessary is out now. She is able to urinate and have a bowel movement without issue.  Office visit scheduled with Dr. Quincy Simmonds tomorrow at 10:00.  She will seek care at closest ED if any worsening symptoms.  Routing to provider for final review. Patient agreeable to disposition. Will close encounter.

## 2016-04-08 ENCOUNTER — Encounter: Payer: Self-pay | Admitting: Obstetrics and Gynecology

## 2016-04-08 ENCOUNTER — Ambulatory Visit (INDEPENDENT_AMBULATORY_CARE_PROVIDER_SITE_OTHER): Payer: Medicare Other | Admitting: Obstetrics and Gynecology

## 2016-04-08 VITALS — BP 122/84 | HR 60 | Ht 61.0 in | Wt 146.0 lb

## 2016-04-08 DIAGNOSIS — N950 Postmenopausal bleeding: Secondary | ICD-10-CM | POA: Diagnosis not present

## 2016-04-08 DIAGNOSIS — I639 Cerebral infarction, unspecified: Secondary | ICD-10-CM

## 2016-04-08 NOTE — Progress Notes (Signed)
GYNECOLOGY  VISIT   HPI: 82 y.o.   Married  Caucasian  female   [phone] with Patient's last menstrual period was 09/12/1973 (approximate).   here for bleeding after removing pessary a few days ago. No blood with urinating or pain. She is able to urinate and have bowel movement without issue. Patient has replaced pessary for office visit.   States she has been spotting for some time.  Notes the bleeding is more.  The bleeding is not so obvious when the pessary is in place. When she removes the pessary, this is when she notes the bleeding more. The bleeding does not occur with the removal of the pessary itself. Denies pelvic pain.   Not sure if the bleeding is coming from the vagina or the bladder. States it has been occurring for about 6 weeks.  Hx atrial fibrillation.  Stopped coumadin in April due to a GI bleed from diverticular disease. Had tx with Vit K and transfusion.  Had a stroke in May 2017. Recovering well.  Now on Eliquis.  States Hgb is now 11.7.  Saw Dr. Matilde Sprang this week and had a urine culture.  GYNECOLOGIC HISTORY: Patient's last menstrual period was 09/12/1973 (approximate). Contraception:  Postmenopausal Menopausal hormone therapy:  none Last mammogram:  07/2014 [place] patient Last pap smear:   2012 normal per patient        OB History    Gravida Para Term Preterm AB Living   3 3 3     3    SAB TAB Ectopic Multiple Live Births                     Patient Active Problem List   Diagnosis Date Noted  . Diplopia 01/29/2016  . GERD (gastroesophageal reflux disease) 01/29/2016  . Cerebrovascular accident (CVA) due to embolism of left middle cerebral artery ([place]) 01/29/2016  . Persistent atrial fibrillation ([place])   . Bleeding gastrointestinal   . Gait disturbance, post-stroke 01/27/2016  . Fall   . Secondary hypertension, unspecified   . Cerebral infarction due to thrombosis of left middle cerebral artery (HCC) s/p mechanical thrombectomy   .  Malnutrition of moderate degree 01/09/2016  . GI bleed 01/06/2016  . Chronic atrial fibrillation ([place]) 06/10/2015  . Pulmonary hypertension, secondary ([place]) 06/10/2015  . Chronic anticoagulation 06/10/2015  . HLD (hyperlipidemia) 06/10/2015  . Essential hypertension 06/10/2015  . History of stroke 06/10/2015  . Coronary artery disease due to lipid rich plaque 06/10/2015  . Afib ([place])   . Elevated uric acid in blood   . Arthritis of ankle   . Pulmonary HTN ([place])     Past Medical History:  Diagnosis Date  . Afib ([place])   . Arthritis of ankle   . CAD (coronary artery disease)   . Cancer [facility]) 2012   breast cancer-right  . Compression fracture of T12 vertebra (HCC)   . Diverticulosis   . Edema   . Elevated uric acid in blood   . GI bleed 12/2015  . Hypertension   . Long-term (current) use of anticoagulants   . Moderate mitral regurgitation   . Pulmonary HTN ([place])   . Stroke ([place]) 01/25/2016  . TIA (transient ischemic attack) 09/2011  . Urinary incontinence     Past Surgical History:  Procedure Laterality Date  . APPENDECTOMY    . BREAST SURGERY  2012   Rt.mastectomy--[place], [place]  2013  . CATARACT EXTRACTION, BILATERAL    .  DILATION AND CURETTAGE OF UTERUS     in her early 2's for DUB  . ESOPHAGOGASTRODUODENOSCOPY Left 01/08/2016   Procedure: ESOPHAGOGASTRODUODENOSCOPY (EGD);  Surgeon: Arta Silence, MD;  Location: [facility];  Service: Endoscopy;  Laterality: Left;  . GALLBLADDER SURGERY    . MASTECTOMY Right   . RADIOLOGY WITH ANESTHESIA N/A 01/25/2016   Procedure: RADIOLOGY WITH ANESTHESIA;  Surgeon: Luanne Bras, MD;  Location: [facility];  Service: Radiology;  Laterality: N/A;  . REPLACEMENT TOTAL KNEE BILATERAL    . TONSILLECTOMY AND ADENOIDECTOMY     as a child    Current Outpatient Prescriptions  Medication Sig Dispense Refill  . acetaminophen (TYLENOL) 500 MG tablet Take 500-1,000 mg by mouth every 12 (twelve) hours as needed  (pain).     Marland Kitchen apixaban (ELIQUIS) 5 MG TABS tablet Take 1 tablet (5 mg total) by mouth 2 (two) times daily. 60 tablet 1  . CALCIUM PO Take 1 tablet by mouth 2 (two) times daily.     Marland Kitchen CARAFATE 1 GM/10ML suspension Take 10 mLs by mouth 2 (two) times daily.    . Cholecalciferol (VITAMIN D PO) Take 1 tablet by mouth daily.     Marland Kitchen diltiazem (CARDIZEM) 60 MG tablet Take 1 tablet (60 mg total) by mouth 3 (three) times daily. 270 tablet 3  . loperamide (IMODIUM) 2 MG capsule Take by mouth as needed for diarrhea or loose stools.    . metoprolol (LOPRESSOR) 100 MG tablet Take 1 tablet (100 mg total) by mouth 2 (two) times daily. 180 tablet 3  . Multiple Vitamins-Minerals (MULTIVITAMIN ADULT PO) Take by mouth.    Marland Kitchen omeprazole (PRILOSEC) 40 MG capsule Take 40 mg by mouth daily.     . vitamin C (ASCORBIC ACID) 500 MG tablet Take 500 mg by mouth daily. Takes  2 dailu     No current facility-administered medications for this visit.      ALLERGIES: Amiodarone hcl [amiodarone]; Compazine [prochlorperazine]; Other; Thorazine [chlorpromazine]; Valium [diazepam]; and Zometa [zoledronic acid]  Family History  Problem Relation Age of Onset  . Asthma Mother   . CVA Father   . Atrial fibrillation Sister     HAS PACER  . Pneumonia Brother   . Cancer Brother     BREAST CANCER  . Cancer Daughter     COLON CANCER    Social History   Social History  . Marital status: Married    Spouse name: N/A  . Number of children: N/A  . Years of education: N/A   Occupational History  . Not on file.   Social History Main Topics  . Smoking status: Former Smoker    Types: Cigarettes  . Smokeless tobacco: Never Used  . Alcohol use No  . Drug use: No  . Sexual activity: Yes    Partners: Male    Birth control/ protection: Post-menopausal     Comment: IN COLLEGE   Other Topics Concern  . Not on file   Social History Narrative  . No narrative on file    ROS:  Pertinent items are noted in HPI.  PHYSICAL  EXAMINATION:    BP 122/84   Pulse 60   Ht 5\' 1"  (1.549 m)   Wt 146 lb (66.2 kg)   LMP 09/12/1973 (Approximate)   BMI 27.59 kg/m     General appearance: alert, cooperative and appears stated age    Pelvic: External genitalia:  no lesions  Urethra:  normal appearing urethra with no masses, tenderness or lesions              Bartholins and Skenes: normal                 Vagina: normal appearing vagina with normal color and discharge, no lesions              Cervix: no lesions                Bimanual Exam:  Uterus:  normal size, contour, position, consistency, mobility, non-tender.  Complete uterovaginal prolapse.              Adnexa: no mass, fullness, tenderness      Pessary ring with support removed, cleansed, and replaced.   Chaperone was present for exam.  ASSESSMENT  Postmenopausal bleeding? Complete uterovaginal prolapse.  No vaginal or cervical erosions.  On Elaquis for afib. Recent stroke.  PLAN  Discussion of postmenopausal bleeding as a possible source of the bleeding.   Patient understands that uterine cancer needs to be ruled out.  Will proceed with pelvic ultrasound appointment.  May need EMB depending on the findings.     An After Visit Summary was printed and given to the patient.  _15_____ minutes face to face time of which over 50% was spent in counseling.

## 2016-04-13 ENCOUNTER — Ambulatory Visit: Payer: Medicare Other | Attending: Physical Medicine & Rehabilitation | Admitting: Physical Therapy

## 2016-04-13 VITALS — BP 145/75 | HR 69

## 2016-04-13 DIAGNOSIS — R278 Other lack of coordination: Secondary | ICD-10-CM | POA: Insufficient documentation

## 2016-04-13 DIAGNOSIS — R2689 Other abnormalities of gait and mobility: Secondary | ICD-10-CM | POA: Diagnosis not present

## 2016-04-13 DIAGNOSIS — M6281 Muscle weakness (generalized): Secondary | ICD-10-CM | POA: Diagnosis not present

## 2016-04-13 NOTE — Therapy (Signed)
[facility] [address] [place], [place], [address] Phone: [phone]   Fax:  [phone]  Physical Therapy Treatment  Patient Details  Name: Adrienne [name] MRN: [id number] Date of Birth: [birth year] Referring Provider: Dr. Letta Pate  Encounter Date: 04/13/2016      PT End of Session - 04/13/16 1514    Visit Number 13   Number of Visits 17   Date for PT Re-Evaluation 04/10/16   Authorization Type G-code and progress note every 10th visit.   PT Start Time 1319   PT Stop Time 1404   PT Time Calculation (min) 45 min   Equipment Utilized During Treatment Gait belt   Activity Tolerance Patient tolerated treatment well   Behavior During Therapy WFL for tasks assessed/performed      Past Medical History:  Diagnosis Date  . Afib ([place])   . Arthritis of ankle   . CAD (coronary artery disease)   . Cancer [facility]) 2012   breast cancer-right  . Compression fracture of T12 vertebra (HCC)   . Diverticulosis   . Edema   . Elevated uric acid in blood   . GI bleed 12/2015  . Hypertension   . Long-term (current) use of anticoagulants   . Moderate mitral regurgitation   . Pulmonary HTN ([place])   . Stroke ([place]) 01/25/2016  . TIA (transient ischemic attack) 09/2011  . Urinary incontinence     Past Surgical History:  Procedure Laterality Date  . APPENDECTOMY    . BREAST SURGERY  2012   Rt.mastectomy--[place], [place]  2013  . CATARACT EXTRACTION, BILATERAL    . DILATION AND CURETTAGE OF UTERUS     in her early 75's for DUB  . ESOPHAGOGASTRODUODENOSCOPY Left 01/08/2016   Procedure: ESOPHAGOGASTRODUODENOSCOPY (EGD);  Surgeon: Arta Silence, MD;  Location: [facility];  Service: Endoscopy;  Laterality: Left;  . GALLBLADDER SURGERY    . MASTECTOMY Right   . RADIOLOGY WITH ANESTHESIA N/A 01/25/2016   Procedure: RADIOLOGY WITH ANESTHESIA;  Surgeon: Luanne Bras, MD;  Location: [facility];  Service:  Radiology;  Laterality: N/A;  . REPLACEMENT TOTAL KNEE BILATERAL    . TONSILLECTOMY AND ADENOIDECTOMY     as a child    Vitals:   04/13/16 1300  BP: (!) 145/75  Pulse: 69  SpO2: 99%        Subjective Assessment - 04/13/16 1324    Subjective Denies falls or changes.   Pertinent History A-fib, pulmonary HTN, HTN, hx of R breast CA in 2012, Hx of T12 compression fx (within the last year) but denied precautions, R ankle OA, Hx of GI hemorrhage with diverticulosis, CAD   Patient Stated Goals Walk without any assistance   Currently in Pain? No/denies            [facility] PT Assessment - 04/13/16 0001      6 Minute Walk- Baseline   6 Minute Walk- Baseline yes   Modified Borg Scale for Dyspnea 5- Strong or hard breathing   Perceived Rate of Exertion (Borg) 17- Very hard     6 minute walk test results    Aerobic Endurance Distance Walked 815   Endurance additional comments DOE 3/4           Balance Exercises - 04/13/16 1512      Balance Exercises: Standing   Other Standing Exercises side stepping, forward<>backward walking, taps to 4" step, stepping across/back ruler in floor.  Performed all with initial bil UE assist  progressing to intermittent UE assist.  Needed rest breaks during session.             PT Short Term Goals - 03/11/16 1127      PT SHORT TERM GOAL #1   Title Pt will be IND in HEP in order to improve balance and strength. Target date: 03/09/16   Baseline met    Status Achieved     PT SHORT TERM GOAL #2   Title Pt will improve BERG score to >/=47/56 to decr. falls risk. Target date: 03/09/16   Baseline 46/56 on 03/11/16    Status Partially Met     PT SHORT TERM GOAL #3   Title Pt will perform TUG with LRAD in </=13.5sec. to decr. falls risk. Target date: 03/09/16   Baseline 12.85 secs with RW   Status Achieved     PT SHORT TERM GOAL #4   Title Pt will amb. 300' over even terrain with LRAD at MOD I level to improve functional mobilty. Target date:  03/09/16   Baseline mod I with RW, S with cane (provided education to begin gait with SPC at home with S from husband)   Status Achieved           PT Long Term Goals - 04/07/16 1201      PT LONG TERM GOAL #1   Title Pt will verbalize understanding of CVA symptoms/risk factors to decr. risk of additonal CVA. Target date: 04/06/16   Baseline All unmet goals will be carried over to new POCl 05/05/16   Status On-going     PT LONG TERM GOAL #2   Title Pt will improve BERG score to >/=51/56 to decr. falls risk. Target date: 04/06/16   Status Partially Met     PT LONG TERM GOAL #3   Title Pt will be able to ascend/descend one step without handrail, with LRAD, at MOD I level to traverse step at home. Target date: 04/06/16   Status Partially Met     PT LONG TERM GOAL #4   Title Pt will amb. 500' over even/uneven terrain with LRAD at MOD I level to improve functional mobility. Target date: 04/06/16   Status Partially Met     PT LONG TERM GOAL #5   Title Pt will improve gait speed with SPC to >/=2.23f/sec. to safely amb. in the community. Target date: 04/06/16   Baseline Met 03/31/16 with RW, [facility]  Status Partially Met     Additional Long Term Goals   Additional Long Term Goals Yes     PT LONG TERM GOAL #6   Title Perform 6MWT and write goal. Target date: 05/05/16   Status New               Plan - 04/13/16 1514    Clinical Impression Statement Pt with 3/4 DOE with 6 MWT.  Motivated to increase mobility and decrease fall risk.  Continue PT per POC.   Rehab Potential Good   Clinical Impairments Affecting Rehab Potential co-morbidities   PT Frequency 2x / week   PT Duration 4 weeks  original POC 2x/week for 8 weeks   PT Treatment/Interventions ADLs/Self Care Home Management;Biofeedback;Canalith Repostioning;Vestibular;Manual techniques;Balance training;Therapeutic exercise;Therapeutic activities;Functional mobility training;Stair training;Gait training;DME  Instruction;Patient/family education;Neuromuscular re-education;Orthotic Fit/Training   PT Next Visit Plan PT to write 6 MWT goal based on todays test results.  Continue gait and balance.   PT Home Exercise Plan balance and strengthening HEP   Consulted and Agree  with Plan of Care Patient      Patient will benefit from skilled therapeutic intervention in order to improve the following deficits and impairments:  Abnormal gait, Decreased endurance, Decreased knowledge of use of DME, Postural dysfunction, Decreased balance, Decreased mobility  Visit Diagnosis: Other abnormalities of gait and mobility  Muscle weakness (generalized)  Other lack of coordination     Problem List Patient Active Problem List   Diagnosis Date Noted  . Diplopia 01/29/2016  . GERD (gastroesophageal reflux disease) 01/29/2016  . Cerebrovascular accident (CVA) due to embolism of left middle cerebral artery ([place]) 01/29/2016  . Persistent atrial fibrillation ([place])   . Bleeding gastrointestinal   . Gait disturbance, post-stroke 01/27/2016  . Fall   . Secondary hypertension, unspecified   . Cerebral infarction due to thrombosis of left middle cerebral artery (HCC) s/p mechanical thrombectomy   . Malnutrition of moderate degree 01/09/2016  . GI bleed 01/06/2016  . Chronic atrial fibrillation ([place]) 06/10/2015  . Pulmonary hypertension, secondary ([place]) 06/10/2015  . Chronic anticoagulation 06/10/2015  . HLD (hyperlipidemia) 06/10/2015  . Essential hypertension 06/10/2015  . History of stroke 06/10/2015  . Coronary artery disease due to lipid rich plaque 06/10/2015  . Afib ([place])   . Elevated uric acid in blood   . Arthritis of ankle   . Pulmonary HTN ([place])     Narda Bonds 04/13/2016, 3:16 PM  [facility] [address] [place], [place], [address] Phone: [phone]   Fax:  [phone]  Name: Adrienne [name] MRN: [id number] Date of  Birth: [birth year]   Narda Bonds, [place] 04/13/16 3:16 PM Phone: [phone] Fax: [phone]

## 2016-04-14 ENCOUNTER — Encounter: Payer: Self-pay | Admitting: Obstetrics and Gynecology

## 2016-04-14 ENCOUNTER — Ambulatory Visit: Payer: Medicare Other | Admitting: Cardiology

## 2016-04-14 ENCOUNTER — Ambulatory Visit (INDEPENDENT_AMBULATORY_CARE_PROVIDER_SITE_OTHER): Payer: Medicare Other | Admitting: Obstetrics and Gynecology

## 2016-04-14 ENCOUNTER — Ambulatory Visit (INDEPENDENT_AMBULATORY_CARE_PROVIDER_SITE_OTHER): Payer: Medicare Other

## 2016-04-14 VITALS — BP 122/80 | HR 66 | Ht 61.0 in | Wt 146.0 lb

## 2016-04-14 DIAGNOSIS — I639 Cerebral infarction, unspecified: Secondary | ICD-10-CM | POA: Diagnosis not present

## 2016-04-14 DIAGNOSIS — Z87440 Personal history of urinary (tract) infections: Secondary | ICD-10-CM

## 2016-04-14 DIAGNOSIS — N812 Incomplete uterovaginal prolapse: Secondary | ICD-10-CM

## 2016-04-14 DIAGNOSIS — N760 Acute vaginitis: Secondary | ICD-10-CM | POA: Diagnosis not present

## 2016-04-14 DIAGNOSIS — N950 Postmenopausal bleeding: Secondary | ICD-10-CM

## 2016-04-14 DIAGNOSIS — T8369XA Infection and inflammatory reaction due to other prosthetic device, implant and graft in genital tract, initial encounter: Secondary | ICD-10-CM

## 2016-04-14 NOTE — Progress Notes (Signed)
Patient ID: Adrienne [name], female   DOB: 26-Feb-1928, 82 y.o.   MRN: [id number] GYNECOLOGY  VISIT   HPI: 82 y.o.   Married  Caucasian  female   [phone] with Patient's last menstrual period was 09/12/1973 (approximate).   here for pelvic ultrasound for postmenopausal bleeding.    Had spotting which occurred the day before yesterday and the pessary was in at that time.  Believes this was coming from the vaginal area.   States she had a cyst[place] in [place] which showed erythema of the bladder.  Was treated with a prolonged course of abx.  Had seen Dr. Matilde Sprang for a recent evaluation of recurrent and she is now on maintenance of Keflex which was just started following a tx for UTI.  Trimethoprim was not working.  She has follow up in 6 weeks.   GYNECOLOGIC HISTORY: Patient's last menstrual period was 09/12/1973 (approximate). Contraception:  Postmenopausal Menopausal hormone therapy:  none Last mammogram:  07/2014 normal in [place] per patient(hx Rt. Breast CA w/Rt.mastectomy) Last pap smear:   2012 normal per patient        OB History    Gravida Para Term Preterm AB Living   3 3 3     3    SAB TAB Ectopic Multiple Live Births                     Patient Active Problem List   Diagnosis Date Noted  . Diplopia 01/29/2016  . GERD (gastroesophageal reflux disease) 01/29/2016  . Cerebrovascular accident (CVA) due to embolism of left middle cerebral artery ([place]) 01/29/2016  . Persistent atrial fibrillation ([place])   . Bleeding gastrointestinal   . Gait disturbance, post-stroke 01/27/2016  . Fall   . Secondary hypertension, unspecified   . Cerebral infarction due to thrombosis of left middle cerebral artery (HCC) s/p mechanical thrombectomy   . Malnutrition of moderate degree 01/09/2016  . GI bleed 01/06/2016  . Chronic atrial fibrillation ([place]) 06/10/2015  . Pulmonary hypertension, secondary ([place]) 06/10/2015  . Chronic anticoagulation 06/10/2015  . HLD (hyperlipidemia) 06/10/2015  .  Essential hypertension 06/10/2015  . History of stroke 06/10/2015  . Coronary artery disease due to lipid rich plaque 06/10/2015  . Afib ([place])   . Elevated uric acid in blood   . Arthritis of ankle   . Pulmonary HTN ([place])     Past Medical History:  Diagnosis Date  . Afib ([place])   . Arthritis of ankle   . CAD (coronary artery disease)   . Cancer [facility]) 2012   breast cancer-right  . Compression fracture of T12 vertebra (HCC)   . Diverticulosis   . Edema   . Elevated uric acid in blood   . GI bleed 12/2015  . Hypertension   . Long-term (current) use of anticoagulants   . Moderate mitral regurgitation   . Pulmonary HTN ([place])   . Stroke ([place]) 01/25/2016  . TIA (transient ischemic attack) 09/2011  . Urinary incontinence     Past Surgical History:  Procedure Laterality Date  . APPENDECTOMY    . BREAST SURGERY  2012   Rt.mastectomy--[place], [place]  2013  . CATARACT EXTRACTION, BILATERAL    . DILATION AND CURETTAGE OF UTERUS     in her early 54's for DUB  . ESOPHAGOGASTRODUODENOSCOPY Left 01/08/2016   Procedure: ESOPHAGOGASTRODUODENOSCOPY (EGD);  Surgeon: Arta Silence, MD;  Location: [facility];  Service: Endoscopy;  Laterality: Left;  . GALLBLADDER SURGERY    . MASTECTOMY  Right   . RADIOLOGY WITH ANESTHESIA N/A 01/25/2016   Procedure: RADIOLOGY WITH ANESTHESIA;  Surgeon: Luanne Bras, MD;  Location: [facility];  Service: Radiology;  Laterality: N/A;  . REPLACEMENT TOTAL KNEE BILATERAL    . TONSILLECTOMY AND ADENOIDECTOMY     as a child    Current Outpatient Prescriptions  Medication Sig Dispense Refill  . acetaminophen (TYLENOL) 500 MG tablet Take 500-1,000 mg by mouth every 12 (twelve) hours as needed (pain).     Marland Kitchen apixaban (ELIQUIS) 5 MG TABS tablet Take 1 tablet (5 mg total) by mouth 2 (two) times daily. 60 tablet 1  . CALCIUM PO Take 1 tablet by mouth 2 (two) times daily.     Marland Kitchen CARAFATE 1 GM/10ML suspension Take 10 mLs by mouth 2 (two) times  daily.    . Cephalexin (KEFLEX PO) Take 125 mg by mouth 1 day or 1 dose.    . Cholecalciferol (VITAMIN D PO) Take 1 tablet by mouth daily.     Marland Kitchen diltiazem (CARDIZEM) 60 MG tablet Take 1 tablet (60 mg total) by mouth 3 (three) times daily. 270 tablet 3  . loperamide (IMODIUM) 2 MG capsule Take by mouth as needed for diarrhea or loose stools.    . metoprolol (LOPRESSOR) 100 MG tablet Take 1 tablet (100 mg total) by mouth 2 (two) times daily. 180 tablet 3  . Multiple Vitamins-Minerals (MULTIVITAMIN ADULT PO) Take by mouth.    Marland Kitchen omeprazole (PRILOSEC) 40 MG capsule Take 40 mg by mouth daily.     . vitamin C (ASCORBIC ACID) 500 MG tablet Take 500 mg by mouth daily. Takes  2 dailu     No current facility-administered medications for this visit.      ALLERGIES: Amiodarone hcl [amiodarone]; Compazine [prochlorperazine]; Other; Thorazine [chlorpromazine]; Valium [diazepam]; and Zometa [zoledronic acid]  Family History  Problem Relation Age of Onset  . Asthma Mother   . CVA Father   . Atrial fibrillation Sister     HAS PACER  . Pneumonia Brother   . Cancer Brother     BREAST CANCER  . Cancer Daughter     COLON CANCER    Social History   Social History  . Marital status: Married    Spouse name: N/A  . Number of children: N/A  . Years of education: N/A   Occupational History  . Not on file.   Social History Main Topics  . Smoking status: Former Smoker    Types: Cigarettes  . Smokeless tobacco: Never Used  . Alcohol use No  . Drug use: No  . Sexual activity: Yes    Partners: Male    Birth control/ protection: Post-menopausal     Comment: IN COLLEGE   Other Topics Concern  . Not on file   Social History Narrative  . No narrative on file    ROS:  Pertinent items are noted in HPI.  PHYSICAL EXAMINATION:    BP 122/80 (BP Location: Left Arm, Patient Position: Sitting, Cuff Size: Normal)   Pulse 66   Ht 5\' 1"  (1.549 m)   Wt 146 lb (66.2 kg)   LMP 09/12/1973 (Approximate)    BMI 27.59 kg/m     General appearance: alert, cooperative and appears stated age    Pelvic: External genitalia:  no lesions              Urethra:  normal appearing urethra with no masses, tenderness or lesions  Bartholins and Skenes: normal                 Vagina: normal appearing vagina with normal color and discharge,  Mild erythema of the left vaginal apex.  Area involved is 1 cm.  No frank ulceration.               Cervix: no lesions              Pap taken: No. Bimanual Exam:  Uterus:  normal size, contour, position, consistency, mobility, non-tender              Adnexa: normal adnexa and no mass, fullness, tenderness         Bladder prolapsing in front of pessary.  Pessary ring with support removed, cleansed, and replaced using exam lubricant.   Ultrasound showing calcifications in the myometrium.  EMS thin.  Normal ovaries.  No free fluid.   Chaperone was present for exam.  ASSESSMENT  Incomplete uterovaginal prolapse.  Postmenopausal bleeding from pessary use. Pelvic anatomy reassuring.   I suspect calcifications of the uterus may be old fibroids. Recurrent UTIs.  On Keflex prophylaxis.  Atrial fibrillation. On Eliquis.  Hx stroke.   PLAN  Discussion of bleeding and potential sources.  I believe this is directly from the pessary.  Ok to continue pessary use.  Will do pap at next visit. Patient will leave pessary out for a couple of days prior to her office visit.  May need a larger pessary fitted to hold prolapse better.  This may also help with recurrent UTIs.  No EMB needed. Follow up in 6 weeks.    An After Visit Summary was printed and given to the patient.  __25____ minutes face to face time of which over 50% was spent in counseling.

## 2016-04-22 ENCOUNTER — Ambulatory Visit: Payer: Medicare Other

## 2016-04-22 VITALS — BP 148/66 | HR 64

## 2016-04-22 DIAGNOSIS — M6281 Muscle weakness (generalized): Secondary | ICD-10-CM

## 2016-04-22 DIAGNOSIS — R2689 Other abnormalities of gait and mobility: Secondary | ICD-10-CM

## 2016-04-22 DIAGNOSIS — R278 Other lack of coordination: Secondary | ICD-10-CM | POA: Diagnosis not present

## 2016-04-22 NOTE — Therapy (Addendum)
[facility] [address] [place], [place], [address] Phone: [phone]   Fax:  [phone]  Physical Therapy Treatment  Patient Details  Name: Adrienne [name] MRN: [id number] Date of Birth: 10/13/1927 Referring Provider: Dr. Letta Pate  Encounter Date: 04/22/2016      PT End of Session - 04/22/16 1441    Visit Number 14   Number of Visits 17   Date for PT Re-Evaluation 05/11/16   Authorization Type G-code and progress note every 10th visit.   PT Start Time 1403   PT Stop Time 1445   PT Time Calculation (min) 42 min   Equipment Utilized During Treatment Gait belt   Activity Tolerance Patient tolerated treatment well   Behavior During Therapy WFL for tasks assessed/performed      Past Medical History:  Diagnosis Date  . Afib ([place])   . Arthritis of ankle   . CAD (coronary artery disease)   . Cancer [facility]) 2012   breast cancer-right  . Compression fracture of T12 vertebra (HCC)   . Diverticulosis   . Edema   . Elevated uric acid in blood   . GI bleed 12/2015  . Hypertension   . Long-term (current) use of anticoagulants   . Moderate mitral regurgitation   . Pulmonary HTN ([place])   . Stroke ([place]) 01/25/2016  . TIA (transient ischemic attack) 09/2011  . Urinary incontinence     Past Surgical History:  Procedure Laterality Date  . APPENDECTOMY    . BREAST SURGERY  2012   Rt.mastectomy--[place]  2013  . CATARACT EXTRACTION, BILATERAL    . DILATION AND CURETTAGE OF UTERUS     in her early 70's for DUB  . ESOPHAGOGASTRODUODENOSCOPY Left 01/08/2016   Procedure: ESOPHAGOGASTRODUODENOSCOPY (EGD);  Surgeon: Arta Silence, MD;  Location: [facility];  Service: Endoscopy;  Laterality: Left;  . GALLBLADDER SURGERY    . MASTECTOMY Right   . RADIOLOGY WITH ANESTHESIA N/A 01/25/2016   Procedure: RADIOLOGY WITH ANESTHESIA;  Surgeon: Luanne Bras, MD;  Location: [facility];  Service:  Radiology;  Laterality: N/A;  . REPLACEMENT TOTAL KNEE BILATERAL    . TONSILLECTOMY AND ADENOIDECTOMY     as a child    Vitals:   04/22/16 1434  BP: (!) 148/66  Pulse: 64        Subjective Assessment - 04/22/16 1406    Subjective Pt denied falls or changes since last visit. Pt experiencing SOB upon amb. back to gym from lobby, and stated she was tired as she's preparing for company this weekend. Pt reported back pain has decr.   Pertinent History A-fib, pulmonary HTN, HTN, hx of R breast CA in 2012, Hx of T12 compression fx (within the last year) but denied precautions, R ankle OA, Hx of GI hemorrhage with diverticulosis, CAD   Patient Stated Goals Walk without any assistance   Currently in Pain? No/denies                         [facility] Adult PT Treatment/Exercise - 04/22/16 1408      Ambulation/Gait   Ambulation/Gait Yes   Ambulation/Gait Assistance 5: Supervision;6: Modified independent (Device/Increase time)   Ambulation/Gait Assistance Details MOD I level with RW, and min guard to S with SPC. SaO2 on room air after 75' amb. with RW 93-99% and HR: 60's. Pt amb. while performing head turns during indoor gait with SPC. Cues for technique, improved stride length, look  10' ahead vs. straight down to avoid obstacles and progress to looking straight ahead . Pt's SaO2 on room air: >95% and HR: in 70's during amb.    Ambulation Distance (Feet) 400 Feet  outdoors and 560' indoors with SPC, 75'x2 with RW   Assistive device Straight cane;Rolling walker   Gait Pattern Step-through pattern;Trendelenburg;Decreased stride length;Decreased step length - left   Ambulation Surface Level;Indoor     Exercises   Exercises Knee/Hip     Knee/Hip Exercises: Aerobic   Recumbent Bike SciFit level 1.0 with BLEs only. cues to stay >50-60RPMs to improve BLE strength and overall endurance. Distance:  miles.                 PT Education - 04/22/16 1440    Education provided Yes    Education Details PT discussed the importance continuing HEP and incr. walking in the house with [facility] to improve endurance. PT noted pt's B LE edema when pt on SciFit bike, pt reported this is 2/2 pt being up on feet all day. PT encouraged pt to donn compression stockings and elevate BLEs after session (at home).    Person(s) Educated Patient   Methods Explanation   Comprehension Verbalized understanding          PT Short Term Goals - 03/11/16 1127      PT SHORT TERM GOAL #1   Title Pt will be IND in HEP in order to improve balance and strength. Target date: 03/09/16   Baseline met    Status Achieved     PT SHORT TERM GOAL #2   Title Pt will improve BERG score to >/=47/56 to decr. falls risk. Target date: 03/09/16   Baseline 46/56 on 03/11/16    Status Partially Met     PT SHORT TERM GOAL #3   Title Pt will perform TUG with LRAD in </=13.5sec. to decr. falls risk. Target date: 03/09/16   Baseline 12.85 secs with RW   Status Achieved     PT SHORT TERM GOAL #4   Title Pt will amb. 300' over even terrain with LRAD at MOD I level to improve functional mobilty. Target date: 03/09/16   Baseline mod I with RW, S with cane (provided education to begin gait with SPC at home with S from husband)   Status Achieved           PT Long Term Goals - 04/22/16 1443      PT LONG TERM GOAL #1   Title Pt will verbalize understanding of CVA symptoms/risk factors to decr. risk of additonal CVA. Target date: 04/06/16   Baseline All unmet goals will be carried over to new POCl 05/05/16   Status On-going     PT LONG TERM GOAL #2   Title Pt will improve BERG score to >/=51/56 to decr. falls risk. Target date: 04/06/16   Status On-going     PT LONG TERM GOAL #3   Title Pt will be able to ascend/descend one step without handrail, with LRAD, at MOD I level to traverse step at home. Target date: 04/06/16   Status On-going     PT LONG TERM GOAL #4   Title Pt will amb. 500' over even/uneven terrain with  LRAD at MOD I level to improve functional mobility. Target date: 04/06/16   Status On-going     PT LONG TERM GOAL #5   Title Pt will improve gait speed with SPC to >/=2.31f/sec. to safely amb. in the community. Target date:  04/06/16   Baseline Met 03/31/16 with RW, so revised to [facility]   Status On-going     PT LONG TERM GOAL #6   Title Pt will improve 6MWT distance to 1015' to improve endurance. Target date: 05/05/16   Baseline 815'   Status Revised               Plan - 04/22/16 1442    Clinical Impression Statement Pt demonstrated progress, as she was able to propel SciFit bike with BLEs only without rest break. Pt continues to require seated rest breaks 2/2 fatigue and SOB, however, vitals WNL during session. continue with POC.    Rehab Potential Good   Clinical Impairments Affecting Rehab Potential co-morbidities   PT Frequency 2x / week   PT Duration 4 weeks  original POC 2x/week for 8 weeks   PT Treatment/Interventions ADLs/Self Care Home Management;Biofeedback;Canalith Repostioning;Vestibular;Manual techniques;Balance training;Therapeutic exercise;Therapeutic activities;Functional mobility training;Stair training;Gait training;DME Instruction;Patient/family education;Neuromuscular re-education;Orthotic Fit/Training   PT Next Visit Plan Continue gait with SPC, endurance, and balance.   PT Home Exercise Plan balance and strengthening HEP   Consulted and Agree with Plan of Care Patient      Patient will benefit from skilled therapeutic intervention in order to improve the following deficits and impairments:  Abnormal gait, Decreased endurance, Decreased knowledge of use of DME, Postural dysfunction, Decreased balance, Decreased mobility  Visit Diagnosis: Other abnormalities of gait and mobility  Muscle weakness (generalized)     Problem List Patient Active Problem List   Diagnosis Date Noted  . Diplopia 01/29/2016  . GERD (gastroesophageal reflux disease) 01/29/2016  .  Cerebrovascular accident (CVA) due to embolism of left middle cerebral artery ([place]) 01/29/2016  . Persistent atrial fibrillation ([place])   . Bleeding gastrointestinal   . Gait disturbance, post-stroke 01/27/2016  . Fall   . Secondary hypertension, unspecified   . Cerebral infarction due to thrombosis of left middle cerebral artery (HCC) s/p mechanical thrombectomy   . Malnutrition of moderate degree 01/09/2016  . GI bleed 01/06/2016  . Chronic atrial fibrillation ([place]) 06/10/2015  . Pulmonary hypertension, secondary ([place]) 06/10/2015  . Chronic anticoagulation 06/10/2015  . HLD (hyperlipidemia) 06/10/2015  . Essential hypertension 06/10/2015  . History of stroke 06/10/2015  . Coronary artery disease due to lipid rich plaque 06/10/2015  . Afib ([place])   . Elevated uric acid in blood   . Arthritis of ankle   . Pulmonary HTN ([place])     Darlyn Repsher L 04/22/2016, 2:44 PM  [facility] [address] [place], [place], [address] Phone: [phone]   Fax:  [phone]  Name: Adrienne [name] MRN: [id number] Date of Birth: 1928-01-03   Geoffry Paradise, PT,DPT 04/22/16 2:44 PM Phone: [phone] Fax: [phone]

## 2016-04-26 ENCOUNTER — Ambulatory Visit: Payer: Medicare Other

## 2016-04-26 VITALS — HR 68

## 2016-04-26 DIAGNOSIS — M6281 Muscle weakness (generalized): Secondary | ICD-10-CM

## 2016-04-26 DIAGNOSIS — R278 Other lack of coordination: Secondary | ICD-10-CM

## 2016-04-26 DIAGNOSIS — R2689 Other abnormalities of gait and mobility: Secondary | ICD-10-CM

## 2016-04-26 NOTE — Therapy (Signed)
[facility] [address] [place], [place], [address] Phone: [phone]   Fax:  [phone]  Physical Therapy Treatment  Patient Details  Name: Adrienne [name] MRN: [id number] Date of Birth: [birth year] Referring Provider: Dr. Letta Pate  Encounter Date: 04/26/2016      PT End of Session - 04/26/16 1247    Visit Number 15   Number of Visits 17   Date for PT Re-Evaluation 05/11/16   Authorization Type G-code and progress note every 10th visit.   PT Start Time 1106  pt arrived late   PT Stop Time 1144   PT Time Calculation (min) 38 min   Equipment Utilized During Treatment Gait belt   Activity Tolerance Patient tolerated treatment well   Behavior During Therapy WFL for tasks assessed/performed      Past Medical History:  Diagnosis Date  . Afib ([place])   . Arthritis of ankle   . CAD (coronary artery disease)   . Cancer [facility]) 2012   breast cancer-right  . Compression fracture of T12 vertebra (HCC)   . Diverticulosis   . Edema   . Elevated uric acid in blood   . GI bleed 12/2015  . Hypertension   . Long-term (current) use of anticoagulants   . Moderate mitral regurgitation   . Pulmonary HTN ([place])   . Stroke ([place]) 01/25/2016  . TIA (transient ischemic attack) 09/2011  . Urinary incontinence     Past Surgical History:  Procedure Laterality Date  . APPENDECTOMY    . BREAST SURGERY  2012   Rt.mastectomy--[place]  2013  . CATARACT EXTRACTION, BILATERAL    . DILATION AND CURETTAGE OF UTERUS     in her early 85's for DUB  . ESOPHAGOGASTRODUODENOSCOPY Left 01/08/2016   Procedure: ESOPHAGOGASTRODUODENOSCOPY (EGD);  Surgeon: Arta Silence, MD;  Location: [facility];  Service: Endoscopy;  Laterality: Left;  . GALLBLADDER SURGERY    . MASTECTOMY Right   . RADIOLOGY WITH ANESTHESIA N/A 01/25/2016   Procedure: RADIOLOGY WITH ANESTHESIA;  Surgeon: Luanne Bras, MD;  Location: [facility];  Service: Radiology;  Laterality: N/A;  . REPLACEMENT TOTAL KNEE BILATERAL    . TONSILLECTOMY AND ADENOIDECTOMY     as a child    Vitals:   04/26/16 1108  Pulse: 68  SpO2: 93%        Subjective Assessment - 04/26/16 1108    Subjective Pt denied falls since last visit. Pt stated she feels a little tired today.   Pertinent History A-fib, pulmonary HTN, HTN, hx of R breast CA in 2012, Hx of T12 compression fx (within the last year) but denied precautions, R ankle OA, Hx of GI hemorrhage with diverticulosis, CAD   Patient Stated Goals Walk without any assistance   Currently in Pain? Yes   Pain Score 6    Pain Location Back   Pain Orientation Lower;Mid   Pain Descriptors / Indicators Aching   Pain Type Chronic pain   Pain Onset More than a month ago   Pain Frequency Intermittent   Aggravating Factors  Pt reported she did extra activity this weekend when she had house guests   Pain Relieving Factors rest                         [facility] Adult PT Treatment/Exercise - 04/26/16 1112      Ambulation/Gait   Ambulation/Gait Yes   Ambulation/Gait Assistance 5: Supervision;4: Min guard  Ambulation/Gait Assistance Details MOD I level with RW, min guard to S with SPC and no AD. SaO2 on room air after amb. without SPC: 97% and HR 73bpm. Cues for upright posture and improve stride length.   Ambulation Distance (Feet) 200 Feet  x2 indoors c and s SPC and 100' outdoors   Assistive device Straight cane;Rolling walker   Gait Pattern Step-through pattern;Trendelenburg;Decreased stride length;Decreased step length - left   Ambulation Surface Level;Indoor   Curb Other (comment)  min guard    Curb Details (indicate cue type and reason) x2 reps. Min guard with SPC and cues for technique (outdoors).     High Level Balance   High Level Balance Activities Backward walking;Head turns   High Level Balance Comments Performed in // bars with no UE support with min guard to min A to  maintain balance      Knee/Hip Exercises: Standing   Forward Step Up Both;2 sets;10 reps;Hand Hold: 1;Hand Hold: 0;Step Height: 4";Step Height: 6"   Forward Step Up Limitations Cues for improved ant. wt. shifting. x10 reps on 4" step and x10 reps on 6" step (x5 reps with 1 UE support and x5 reps with no UE uspport). Pt required one seated rest break 2/2 fatigue. SaO2 on room air: 98% and HR: 72bpm. Performed with min guard to S to ensure safety.                  PT Short Term Goals - 03/11/16 1127      PT SHORT TERM GOAL #1   Title Pt will be IND in HEP in order to improve balance and strength. Target date: 03/09/16   Baseline met    Status Achieved     PT SHORT TERM GOAL #2   Title Pt will improve BERG score to >/=47/56 to decr. falls risk. Target date: 03/09/16   Baseline 46/56 on 03/11/16    Status Partially Met     PT SHORT TERM GOAL #3   Title Pt will perform TUG with LRAD in </=13.5sec. to decr. falls risk. Target date: 03/09/16   Baseline 12.85 secs with RW   Status Achieved     PT SHORT TERM GOAL #4   Title Pt will amb. 300' over even terrain with LRAD at MOD I level to improve functional mobilty. Target date: 03/09/16   Baseline mod I with RW, S with cane (provided education to begin gait with SPC at home with S from husband)   Status Achieved           PT Long Term Goals - 04/22/16 1443      PT LONG TERM GOAL #1   Title Pt will verbalize understanding of CVA symptoms/risk factors to decr. risk of additonal CVA. Target date: 04/06/16   Baseline All unmet goals will be carried over to new POCl 05/05/16   Status On-going     PT LONG TERM GOAL #2   Title Pt will improve BERG score to >/=51/56 to decr. falls risk. Target date: 04/06/16   Status On-going     PT LONG TERM GOAL #3   Title Pt will be able to ascend/descend one step without handrail, with LRAD, at MOD I level to traverse step at home. Target date: 04/06/16   Status On-going     PT LONG TERM GOAL #4    Title Pt will amb. 500' over even/uneven terrain with LRAD at MOD I level to improve functional mobility. Target date: 04/06/16   Status  On-going     PT LONG TERM GOAL #5   Title Pt will improve gait speed with SPC to >/=2.11f/sec. to safely amb. in the community. Target date: 04/06/16   Baseline Met 03/31/16 with RW, [facility]  Status On-going     PT LONG TERM GOAL #6   Title Pt will improve 6MWT distance to 1015' to improve endurance. Target date: 05/05/16   Baseline 815'   Status Revised               Plan - 04/26/16 1248    Clinical Impression Statement Pt demonstrated progress, as she was able to progress to 6" step vs. 4" step and required less assist when negotiating curbs. Pt experiences incr. gait deviations during amb. without AD and demonstrated improved balance with RW and SPC vs. no AD. Pt continues to require seated rest breaks 2/2 fatigue. Pt experiencing slight SOB during session but SaO2 on room air and HR WFL. PT educated pt to f/u with PCP or cardiologist is SOB persists. Continue with POC.    Rehab Potential Good   Clinical Impairments Affecting Rehab Potential co-morbidities   PT Frequency 2x / week   PT Duration 4 weeks  original POC 2x/week for 8 weeks   PT Treatment/Interventions ADLs/Self Care Home Management;Biofeedback;Canalith Repostioning;Vestibular;Manual techniques;Balance training;Therapeutic exercise;Therapeutic activities;Functional mobility training;Stair training;Gait training;DME Instruction;Patient/family education;Neuromuscular re-education;Orthotic Fit/Training   PT Next Visit Plan Continue gait with SPC, endurance, and balance.   PT Home Exercise Plan balance and strengthening HEP   Consulted and Agree with Plan of Care Patient      Patient will benefit from skilled therapeutic intervention in order to improve the following deficits and impairments:  Abnormal gait, Decreased endurance, Decreased knowledge of use of DME, Postural  dysfunction, Decreased balance, Decreased mobility  Visit Diagnosis: Other abnormalities of gait and mobility  Muscle weakness (generalized)  Other lack of coordination     Problem List Patient Active Problem List   Diagnosis Date Noted  . Diplopia 01/29/2016  . GERD (gastroesophageal reflux disease) 01/29/2016  . Cerebrovascular accident (CVA) due to embolism of left middle cerebral artery ([place] 01/29/2016  . Persistent atrial fibrillation ([place]   . Bleeding gastrointestinal   . Gait disturbance, post-stroke 01/27/2016  . Fall   . Secondary hypertension, unspecified   . Cerebral infarction due to thrombosis of left middle cerebral artery (HCC) s/p mechanical thrombectomy   . Malnutrition of moderate degree 01/09/2016  . GI bleed 01/06/2016  . Chronic atrial fibrillation ([place] 06/10/2015  . Pulmonary hypertension, secondary ([place] 06/10/2015  . Chronic anticoagulation 06/10/2015  . HLD (hyperlipidemia) 06/10/2015  . Essential hypertension 06/10/2015  . History of stroke 06/10/2015  . Coronary artery disease due to lipid rich plaque 06/10/2015  . Afib ([place]   . Elevated uric acid in blood   . Arthritis of ankle   . Pulmonary HTN ([place]     Adrienne [name] 04/26/2016, 12:51 PM  [facility] [address]Phone: [phone]  Fax:  [phone] Name: Adrienne TiedemanMRN: [id number] of Birth: [birth year]  JGeoffry Paradise PT,DPT 04/26/16 12:51 PM Phone: [phone]: [phone]

## 2016-04-28 ENCOUNTER — Ambulatory Visit: Payer: Medicare Other

## 2016-04-28 DIAGNOSIS — R2689 Other abnormalities of gait and mobility: Secondary | ICD-10-CM

## 2016-04-28 DIAGNOSIS — M6281 Muscle weakness (generalized): Secondary | ICD-10-CM

## 2016-04-28 DIAGNOSIS — R278 Other lack of coordination: Secondary | ICD-10-CM | POA: Diagnosis not present

## 2016-04-28 NOTE — Therapy (Signed)
[facility] [address] [place], [place], [address] Phone: [phone]   Fax:  [phone]  Physical Therapy Treatment  Patient Details  Name: Adrienne [name] MRN: [id number] Date of Birth: [birth year] Referring Provider: Dr. Letta Pate  Encounter Date: 04/28/2016      PT End of Session - 04/28/16 1205    Visit Number 16   Number of Visits 17   Date for PT Re-Evaluation 05/11/16   Authorization Type G-code and progress note every 10th visit.   PT Start Time 1016   PT Stop Time 1057   PT Time Calculation (min) 41 min   Equipment Utilized During Treatment Gait belt   Activity Tolerance Patient tolerated treatment well   Behavior During Therapy WFL for tasks assessed/performed      Past Medical History:  Diagnosis Date  . Afib ([place])   . Arthritis of ankle   . CAD (coronary artery disease)   . Cancer [facility]) 2012   breast cancer-right  . Compression fracture of T12 vertebra (HCC)   . Diverticulosis   . Edema   . Elevated uric acid in blood   . GI bleed 12/2015  . Hypertension   . Long-term (current) use of anticoagulants   . Moderate mitral regurgitation   . Pulmonary HTN ([place])   . Stroke ([place]) 01/25/2016  . TIA (transient ischemic attack) 09/2011  . Urinary incontinence     Past Surgical History:  Procedure Laterality Date  . APPENDECTOMY    . BREAST SURGERY  2012   Rt.mastectomy--[place]  2013  . CATARACT EXTRACTION, BILATERAL    . DILATION AND CURETTAGE OF UTERUS     in her early 60's for DUB  . ESOPHAGOGASTRODUODENOSCOPY Left 01/08/2016   Procedure: ESOPHAGOGASTRODUODENOSCOPY (EGD);  Surgeon: Arta Silence, MD;  Location: [facility];  Service: Endoscopy;  Laterality: Left;  . GALLBLADDER SURGERY    . MASTECTOMY Right   . RADIOLOGY WITH ANESTHESIA N/A 01/25/2016   Procedure: RADIOLOGY WITH ANESTHESIA;  Surgeon: Luanne Bras, MD;  Location: [facility];  Service:  Radiology;  Laterality: N/A;  . REPLACEMENT TOTAL KNEE BILATERAL    . TONSILLECTOMY AND ADENOIDECTOMY     as a child    There were no vitals filed for this visit.      Subjective Assessment - 04/28/16 1019    Subjective Pt denied falls since last visit and states she has more energy today.   Pertinent History A-fib, pulmonary HTN, HTN, hx of R breast CA in 2012, Hx of T12 compression fx (within the last year) but denied precautions, R ankle OA, Hx of GI hemorrhage with diverticulosis, CAD   Patient Stated Goals Walk without any assistance   Currently in Pain? No/denies                         [facility] Adult PT Treatment/Exercise - 04/28/16 1020      Ambulation/Gait   Ambulation/Gait Yes   Ambulation/Gait Assistance 5: Supervision;4: Min guard;6: Modified independent (Device/Increase time)   Ambulation/Gait Assistance Details MOD I level with RW and min guard to S with SPC. Pt amb. over even/uneven terrain with min guard during amb. over grassy terrain. SaO2 on room air after amb. outdoors: 96% and HR: 69bpm. Pt required seated rest breaks 2/2 fatigue and slight SOB. Cues for sequencing, to ensure pt lines SPC up with ball of R foot for safety.   Ambulation Distance (Feet) 75 Feet  c RW, 500' and 117' with SPC  in/outdoors   Assistive device Straight cane;Rolling walker   Gait Pattern Step-through pattern;Trendelenburg;Decreased stride length;Decreased step length - left   Ambulation Surface Level;Indoor   Curb Other (comment)  min guard   Curb Details (indicate cue type and reason) x3 reps.  Cues for technique and sequcenicng, progressed to S for safety.             Balance Exercises - 04/28/16 1038      Balance Exercises: Standing   Other Standing Exercises Performed single and double cone taps on compliant and non-compliant surfaces (double taps on non-compliant surface only): 4x5cones/LE/activity. Cues to improve lateral wt. shifting. SaO2: 94% and HR:  64bpm. One seated rest break 2/2 SOB and fatigue.           PT Education - 04/28/16 1203    Education provided Yes   Education Details PT again discussed pt using SPC over even terrain in house, as pt states she forgets and just uses the walker out of habit. PT asked pt to bring her SPC to next session. PT also discussed checking goals next week and potentially discharging pt after next week depending on progress.   Person(s) Educated Patient   Methods Explanation   Comprehension Verbalized understanding          PT Short Term Goals - 03/11/16 1127      PT SHORT TERM GOAL #1   Title Pt will be IND in HEP in order to improve balance and strength. Target date: 03/09/16   Baseline met    Status Achieved     PT SHORT TERM GOAL #2   Title Pt will improve BERG score to >/=47/56 to decr. falls risk. Target date: 03/09/16   Baseline 46/56 on 03/11/16    Status Partially Met     PT SHORT TERM GOAL #3   Title Pt will perform TUG with LRAD in </=13.5sec. to decr. falls risk. Target date: 03/09/16   Baseline 12.85 secs with RW   Status Achieved     PT SHORT TERM GOAL #4   Title Pt will amb. 300' over even terrain with LRAD at MOD I level to improve functional mobilty. Target date: 03/09/16   Baseline mod I with RW, S with cane (provided education to begin gait with SPC at home with S from husband)   Status Achieved           PT Long Term Goals - 04/22/16 1443      PT LONG TERM GOAL #1   Title Pt will verbalize understanding of CVA symptoms/risk factors to decr. risk of additonal CVA. Target date: 04/06/16   Baseline All unmet goals will be carried over to new POCl 05/05/16   Status On-going     PT LONG TERM GOAL #2   Title Pt will improve BERG score to >/=51/56 to decr. falls risk. Target date: 04/06/16   Status On-going     PT LONG TERM GOAL #3   Title Pt will be able to ascend/descend one step without handrail, with LRAD, at MOD I level to traverse step at home. Target date:  04/06/16   Status On-going     PT LONG TERM GOAL #4   Title Pt will amb. 500' over even/uneven terrain with LRAD at MOD I level to improve functional mobility. Target date: 04/06/16   Status On-going     PT LONG TERM GOAL #5   Title Pt will improve gait speed with  SPC to >/=2.58f/sec. to safely amb. in the community. Target date: 04/06/16   Baseline Met 03/31/16 [facility] RW, [facility]  Status On-going     PT LONG TERM GOAL #6   Title Pt will improve 6MWT distance to 1015' to improve endurance. Target date: 05/05/16   Baseline 815'   Status Revised               Plan - 04/28/16 1206    Clinical Impression Statement Pt demonstrated progress, as she progressed to S during last rep of traversing curb with SPC. Pt continues to experience LOB and incr. postural sway during SLS activities. Pt would continue to benefit from skilled PT to improve safety during functional mobility.    Rehab Potential Good   Clinical Impairments Affecting Rehab Potential co-morbidities   PT Frequency 2x / week   PT Duration 4 weeks  original POC 2x/week for 8 weeks   PT Treatment/Interventions ADLs/Self Care Home Management;Biofeedback;Canalith Repostioning;Vestibular;Manual techniques;Balance training;Therapeutic exercise;Therapeutic activities;Functional mobility training;Stair training;Gait training;DME Instruction;Patient/family education;Neuromuscular re-education;Orthotic Fit/Training   PT Next Visit Plan Continue gait with SPC, endurance, and balance and begin to assess goals.   PT Home Exercise Plan balance and strengthening HEP   Consulted and Agree with Plan of Care Patient      Patient will benefit from skilled therapeutic intervention in order to improve the following deficits and impairments:  Abnormal gait, Decreased endurance, Decreased knowledge of use of DME, Postural dysfunction, Decreased balance, Decreased mobility  Visit Diagnosis: Other abnormalities of gait and  mobility  Other lack of coordination  Muscle weakness (generalized)     Problem List Patient Active Problem List   Diagnosis Date Noted  . Diplopia 01/29/2016  . GERD (gastroesophageal reflux disease) 01/29/2016  . Cerebrovascular accident (CVA) due to embolism of left middle cerebral artery ([place] 01/29/2016  . Persistent atrial fibrillation ([place]   . Bleeding gastrointestinal   . Gait disturbance, post-stroke 01/27/2016  . Fall   . Secondary hypertension, unspecified   . Cerebral infarction due to thrombosis of left middle cerebral artery (HCC) s/p mechanical thrombectomy   . Malnutrition of moderate degree 01/09/2016  . GI bleed 01/06/2016  . Chronic atrial fibrillation ([place] 06/10/2015  . Pulmonary hypertension, secondary ([place] 06/10/2015  . Chronic anticoagulation 06/10/2015  . HLD (hyperlipidemia) 06/10/2015  . Essential hypertension 06/10/2015  . History of stroke 06/10/2015  . Coronary artery disease due to lipid rich plaque 06/10/2015  . Afib ([place]   . Elevated uric acid in blood   . Arthritis of ankle   . Pulmonary HTN ([place]     Shawntel Farnworth L 04/28/2016, 12:09 PM  [id number] [id number]: [phone]  Fax:  [phone] Name: Adrienne LeonorMRN: [id number] of Birth: 4Jun 07, 1929  JGeoffry Paradise PT,DPT 04/28/16 12:09 PM Phone: [phone]: [phone]

## 2016-05-03 ENCOUNTER — Ambulatory Visit: Payer: Medicare Other

## 2016-05-03 VITALS — HR 58

## 2016-05-03 DIAGNOSIS — R278 Other lack of coordination: Secondary | ICD-10-CM | POA: Diagnosis not present

## 2016-05-03 DIAGNOSIS — R2689 Other abnormalities of gait and mobility: Secondary | ICD-10-CM

## 2016-05-03 DIAGNOSIS — M6281 Muscle weakness (generalized): Secondary | ICD-10-CM | POA: Diagnosis not present

## 2016-05-03 NOTE — Therapy (Signed)
[facility] [address] [place], [place], [address] Phone: [phone]   Fax:  [phone]  Physical Therapy Treatment  Patient Details  Name: Adrienne [name] MRN: [id number] Date of Birth: [birth year] Referring Provider: Dr. Letta Pate  Encounter Date: 05/03/2016      PT End of Session - 05/03/16 1305    Visit Number 17   Number of Visits 25  orginal POC 17 visits   Date for PT Re-Evaluation 05/11/16   Authorization Type G-code and progress note every 10th visit.   PT Start Time 1152   PT Stop Time 1230   PT Time Calculation (min) 38 min   Equipment Utilized During Treatment Gait belt   Activity Tolerance Patient limited by fatigue   Behavior During Therapy WFL for tasks assessed/performed      Past Medical History:  Diagnosis Date  . Afib ([place])   . Arthritis of ankle   . CAD (coronary artery disease)   . Cancer [facility]) 2012   breast cancer-right  . Compression fracture of T12 vertebra (HCC)   . Diverticulosis   . Edema   . Elevated uric acid in blood   . GI bleed 12/2015  . Hypertension   . Long-term (current) use of anticoagulants   . Moderate mitral regurgitation   . Pulmonary HTN ([place])   . Stroke ([place]) 01/25/2016  . TIA (transient ischemic attack) 09/2011  . Urinary incontinence     Past Surgical History:  Procedure Laterality Date  . APPENDECTOMY    . BREAST SURGERY  2012   Rt.mastectomy--[place], [place]  2013  . CATARACT EXTRACTION, BILATERAL    . DILATION AND CURETTAGE OF UTERUS     in her early 10's for DUB  . ESOPHAGOGASTRODUODENOSCOPY Left 01/08/2016   Procedure: ESOPHAGOGASTRODUODENOSCOPY (EGD);  Surgeon: Arta Silence, MD;  Location: [facility];  Service: Endoscopy;  Laterality: Left;  . GALLBLADDER SURGERY    . MASTECTOMY Right   . RADIOLOGY WITH ANESTHESIA N/A 01/25/2016   Procedure: RADIOLOGY WITH ANESTHESIA;  Surgeon: Luanne Bras, MD;  Location: [facility];  Service: Radiology;  Laterality: N/A;  . REPLACEMENT TOTAL KNEE BILATERAL    . TONSILLECTOMY AND ADENOIDECTOMY     as a child    Vitals:   05/03/16 1201  Pulse: (!) 58  SpO2: 94%        Subjective Assessment - 05/03/16 1156    Subjective Pt denied falls since last visit. Pt reported she is having a cough and experiencing a "sound in her chest or sinus" and wheezing, she has a MD appt tomorrow.   Pertinent History A-fib, pulmonary HTN, HTN, hx of R breast CA in 2012, Hx of T12 compression fx (within the last year) but denied precautions, R ankle OA, Hx of GI hemorrhage with diverticulosis, CAD   Patient Stated Goals Walk without any assistance   Currently in Pain? No/denies                         [facility] Adult PT Treatment/Exercise - 05/03/16 1203      Ambulation/Gait   Ambulation/Gait Yes   Ambulation/Gait Assistance 5: Supervision   Ambulation/Gait Assistance Details Cues for proper SPC height, heel strike and upright posture. Pt required seated rest breaks 2/2 SOB, SaO2 on room air remained >92% after each bout of amb. Pt also amb. over various surfaces indoors. Pt required cues for sequencing while amb. around turns.   Ambulation Distance (  Feet) 117 Feet  345', 230', and 117'   Assistive device Straight cane   Gait Pattern Step-through pattern;Trendelenburg;Decreased stride length;Decreased step length - left   Ambulation Surface Level;Indoor;Other (comment)  red mat   Ramp 5: Supervision   Ramp Details (indicate cue type and reason) Cues for technique (wt. shifting). x2 reps   Curb Other (comment)  min guard   Curb Details (indicate cue type and reason) x3 reps without AD with cues for wt. shifting forward and for sequencing (up with the good and down with the bad).                PT Education - 05/03/16 1305    Education provided Yes   Education Details PT discussed reviewing HEP and goals over the next 2 sessions and then likely  discharging pt. PT did not assess goals today 2/2 pt's SOB and cough.   Person(s) Educated Patient   Methods Explanation   Comprehension Verbalized understanding          PT Short Term Goals - 03/11/16 1127      PT SHORT TERM GOAL #1   Title Pt will be IND in HEP in order to improve balance and strength. Target date: 03/09/16   Baseline met    Status Achieved     PT SHORT TERM GOAL #2   Title Pt will improve BERG score to >/=47/56 to decr. falls risk. Target date: 03/09/16   Baseline 46/56 on 03/11/16    Status Partially Met     PT SHORT TERM GOAL #3   Title Pt will perform TUG with LRAD in </=13.5sec. to decr. falls risk. Target date: 03/09/16   Baseline 12.85 secs with RW   Status Achieved     PT SHORT TERM GOAL #4   Title Pt will amb. 300' over even terrain with LRAD at MOD I level to improve functional mobilty. Target date: 03/09/16   Baseline mod I with RW, S with cane (provided education to begin gait with SPC at home with S from husband)   Status Achieved           PT Long Term Goals - 04/22/16 1443      PT LONG TERM GOAL #1   Title Pt will verbalize understanding of CVA symptoms/risk factors to decr. risk of additonal CVA. Target date: 04/06/16   Baseline All unmet goals will be carried over to new POCl 05/05/16   Status On-going     PT LONG TERM GOAL #2   Title Pt will improve BERG score to >/=51/56 to decr. falls risk. Target date: 04/06/16   Status On-going     PT LONG TERM GOAL #3   Title Pt will be able to ascend/descend one step without handrail, with LRAD, at MOD I level to traverse step at home. Target date: 04/06/16   Status On-going     PT LONG TERM GOAL #4   Title Pt will amb. 500' over even/uneven terrain with LRAD at MOD I level to improve functional mobility. Target date: 04/06/16   Status On-going     PT LONG TERM GOAL #5   Title Pt will improve gait speed with SPC to >/=2.65f/sec. to safely amb. in the community. Target date: 04/06/16   Baseline  Met 03/31/16 with RW, [facility]  Status On-going     PT LONG TERM GOAL #6   Title Pt will improve 6MWT distance to 1015' to improve endurance. Target date: 05/05/16  Baseline 815'   Status Revised               Plan - 05/03/16 1306    Clinical Impression Statement Pt limited today by SOB and cough, as she required incr. rest breaks. Pt demonstrated progress, as she was able to traverse curb x3 reps with min guard and cues for technique. Pt continues to require cues for sequencing with SPC over uneven terrain and turns, however, no LOB noted. Continue with POC.    Rehab Potential Good   Clinical Impairments Affecting Rehab Potential co-morbidities   PT Frequency 2x / week   PT Duration 4 weeks  original POC 2x/week for 8 weeks   PT Treatment/Interventions ADLs/Self Care Home Management;Biofeedback;Canalith Repostioning;Vestibular;Manual techniques;Balance training;Therapeutic exercise;Therapeutic activities;Functional mobility training;Stair training;Gait training;DME Instruction;Patient/family education;Neuromuscular re-education;Orthotic Fit/Training   PT Next Visit Plan Review HEP and progress prn and then assess goals.   PT Home Exercise Plan balance and strengthening HEP   Consulted and Agree with Plan of Care Patient      Patient will benefit from skilled therapeutic intervention in order to improve the following deficits and impairments:  Abnormal gait, Decreased endurance, Decreased knowledge of use of DME, Postural dysfunction, Decreased balance, Decreased mobility  Visit Diagnosis: Other abnormalities of gait and mobility     Problem List Patient Active Problem List   Diagnosis Date Noted  . Diplopia 01/29/2016  . GERD (gastroesophageal reflux disease) 01/29/2016  . Cerebrovascular accident (CVA) due to embolism of left middle cerebral artery ([place]) 01/29/2016  . Persistent atrial fibrillation ([place])   . Bleeding gastrointestinal   . Gait disturbance,  post-stroke 01/27/2016  . Fall   . Secondary hypertension, unspecified   . Cerebral infarction due to thrombosis of left middle cerebral artery (HCC) s/p mechanical thrombectomy   . Malnutrition of moderate degree 01/09/2016  . GI bleed 01/06/2016  . Chronic atrial fibrillation ([place]) 06/10/2015  . Pulmonary hypertension, secondary ([place]) 06/10/2015  . Chronic anticoagulation 06/10/2015  . HLD (hyperlipidemia) 06/10/2015  . Essential hypertension 06/10/2015  . History of stroke 06/10/2015  . Coronary artery disease due to lipid rich plaque 06/10/2015  . Afib ([place])   . Elevated uric acid in blood   . Arthritis of ankle   . Pulmonary HTN ([place])     Kashana Breach L 05/03/2016, 1:08 PM  [facility] [address] [place], [place], [address] Phone: [phone]   Fax:  [phone]  Name: Adrienne [name] MRN: [id number] Date of Birth: 09-01-1928   Geoffry Paradise, PT,DPT 05/03/16 1:08 PM Phone: [phone] Fax: [phone]

## 2016-05-03 NOTE — Patient Instructions (Signed)
   Please perform home exercises before next visit and bring them to exercises to next appointment, so we can progress them as tolerated.

## 2016-05-04 DIAGNOSIS — J203 Acute bronchitis due to coxsackievirus: Secondary | ICD-10-CM | POA: Diagnosis not present

## 2016-05-05 ENCOUNTER — Ambulatory Visit: Payer: Medicare Other

## 2016-05-05 DIAGNOSIS — J219 Acute bronchiolitis, unspecified: Secondary | ICD-10-CM | POA: Diagnosis not present

## 2016-05-05 DIAGNOSIS — R2689 Other abnormalities of gait and mobility: Secondary | ICD-10-CM | POA: Diagnosis not present

## 2016-05-05 DIAGNOSIS — M6281 Muscle weakness (generalized): Secondary | ICD-10-CM | POA: Diagnosis not present

## 2016-05-05 DIAGNOSIS — R278 Other lack of coordination: Secondary | ICD-10-CM | POA: Diagnosis not present

## 2016-05-05 NOTE — Therapy (Signed)
[facility] [address] [place], [place], [address] Phone: [phone]   Fax:  [phone]  Physical Therapy Treatment  Patient Details  Name: Adrienne [name] MRN: [id number] Date of Birth: [birth year] Referring Provider: Dr. Letta Pate  Encounter Date: 05/05/2016      PT End of Session - 05/05/16 1203    Visit Number 18   Number of Visits 25   Date for PT Re-Evaluation 05/11/16   Authorization Type G-code and progress note every 10th visit.   PT Start Time 1019   PT Stop Time 1101   PT Time Calculation (min) 42 min   Equipment Utilized During Treatment Gait belt   Activity Tolerance Patient tolerated treatment well   Behavior During Therapy WFL for tasks assessed/performed      Past Medical History:  Diagnosis Date  . Afib ([place])   . Arthritis of ankle   . CAD (coronary artery disease)   . Cancer [facility]) 2012   breast cancer-right  . Compression fracture of T12 vertebra (HCC)   . Diverticulosis   . Edema   . Elevated uric acid in blood   . GI bleed 12/2015  . Hypertension   . Long-term (current) use of anticoagulants   . Moderate mitral regurgitation   . Pulmonary HTN ([place])   . Stroke ([place]) 01/25/2016  . TIA (transient ischemic attack) 09/2011  . Urinary incontinence     Past Surgical History:  Procedure Laterality Date  . APPENDECTOMY    . BREAST SURGERY  2012   Rt.mastectomy--[place], [place]  2013  . CATARACT EXTRACTION, BILATERAL    . DILATION AND CURETTAGE OF UTERUS     in her early 54's for DUB  . ESOPHAGOGASTRODUODENOSCOPY Left 01/08/2016   Procedure: ESOPHAGOGASTRODUODENOSCOPY (EGD);  Surgeon: Arta Silence, MD;  Location: [facility];  Service: Endoscopy;  Laterality: Left;  . GALLBLADDER SURGERY    . MASTECTOMY Right   . RADIOLOGY WITH ANESTHESIA N/A 01/25/2016   Procedure: RADIOLOGY WITH ANESTHESIA;  Surgeon: Luanne Bras, MD;  Location: [facility];  Service:  Radiology;  Laterality: N/A;  . REPLACEMENT TOTAL KNEE BILATERAL    . TONSILLECTOMY AND ADENOIDECTOMY     as a child    There were no vitals filed for this visit.      Subjective Assessment - 05/05/16 1023    Subjective Pt reported she went to MD yesterday and was told she didn't have a fever or need an anti-biotic, but to continue cough medicine.    Pertinent History A-fib, pulmonary HTN, HTN, hx of R breast CA in 2012, Hx of T12 compression fx (within the last year) but denied precautions, R ankle OA, Hx of GI hemorrhage with diverticulosis, CAD   Patient Stated Goals Walk without any assistance   Currently in Pain? No/denies       Therex: Pt performed strengthening HEP with cues for technique during progression for exercises. Please see pt instructions for details.  Neuro re-ed: Pt performed balance HEP in corner with chair in front of pt with S for safety. Pt performed on 2 pillows vs. 1 pillow. Please see pt instructions for details. All balance activities were performed on 2 pillows with feet apart and together.                          PT Education - 05/05/16 1203    Education provided Yes   Education Details PT reviewed pt's HEP and  progressed as tolerated. PT will assess goals next session and likely d/c.   Person(s) Educated Patient   Methods Explanation;Verbal cues;Handout;Demonstration   Comprehension Returned demonstration;Verbalized understanding          PT Short Term Goals - 03/11/16 1127      PT SHORT TERM GOAL #1   Title Pt will be IND in HEP in order to improve balance and strength. Target date: 03/09/16   Baseline met    Status Achieved     PT SHORT TERM GOAL #2   Title Pt will improve BERG score to >/=47/56 to decr. falls risk. Target date: 03/09/16   Baseline 46/56 on 03/11/16    Status Partially Met     PT SHORT TERM GOAL #3   Title Pt will perform TUG with LRAD in </=13.5sec. to decr. falls risk. Target date: 03/09/16    Baseline 12.85 secs with RW   Status Achieved     PT SHORT TERM GOAL #4   Title Pt will amb. 300' over even terrain with LRAD at MOD I level to improve functional mobilty. Target date: 03/09/16   Baseline mod I with RW, S with cane (provided education to begin gait with SPC at home with S from husband)   Status Achieved           PT Long Term Goals - 04/22/16 1443      PT LONG TERM GOAL #1   Title Pt will verbalize understanding of CVA symptoms/risk factors to decr. risk of additonal CVA. Target date: 04/06/16   Baseline All unmet goals will be carried over to new POCl 05/05/16   Status On-going     PT LONG TERM GOAL #2   Title Pt will improve BERG score to >/=51/56 to decr. falls risk. Target date: 04/06/16   Status On-going     PT LONG TERM GOAL #3   Title Pt will be able to ascend/descend one step without handrail, with LRAD, at MOD I level to traverse step at home. Target date: 04/06/16   Status On-going     PT LONG TERM GOAL #4   Title Pt will amb. 500' over even/uneven terrain with LRAD at MOD I level to improve functional mobility. Target date: 04/06/16   Status On-going     PT LONG TERM GOAL #5   Title Pt will improve gait speed with SPC to >/=2.62ft/sec. to safely amb. in the community. Target date: 04/06/16   Baseline Met 03/31/16 with RW, so revised to SPC   Status On-going     PT LONG TERM GOAL #6   Title Pt will improve 6MWT distance to 1015' to improve endurance. Target date: 05/05/16   Baseline 815'   Status Revised               Plan - 05/05/16 1204    Clinical Impression Statement Pt demonstrated progress, as she was able to tolerate progression of balance and strengthening HEP. PT condensed pt's HEP in order to provide pt with most functional exercises, as pt reports she sometimes has difficulty performing entire program. PT will assess goals next session and likely d/c.    Rehab Potential Good   Clinical Impairments Affecting Rehab Potential  co-morbidities   PT Frequency 2x / week   PT Duration 4 weeks  original POC 2x/week for 8 weeks   PT Treatment/Interventions ADLs/Self Care Home Management;Biofeedback;Canalith Repostioning;Vestibular;Manual techniques;Balance training;Therapeutic exercise;Therapeutic activities;Functional mobility training;Stair training;Gait training;DME Instruction;Patient/family education;Neuromuscular re-education;Orthotic Fit/Training   PT Next Visit Plan   Check goals, D/C, and G-CODE.   PT Home Exercise Plan balance and strengthening HEP   Consulted and Agree with Plan of Care Patient      Patient will benefit from skilled therapeutic intervention in order to improve the following deficits and impairments:  Abnormal gait, Decreased endurance, Decreased knowledge of use of DME, Postural dysfunction, Decreased balance, Decreased mobility  Visit Diagnosis: Other lack of coordination  Muscle weakness (generalized)  Other abnormalities of gait and mobility     Problem List Patient Active Problem List   Diagnosis Date Noted  . Diplopia 01/29/2016  . GERD (gastroesophageal reflux disease) 01/29/2016  . Cerebrovascular accident (CVA) due to embolism of left middle cerebral artery (HCC) 01/29/2016  . Persistent atrial fibrillation (HCC)   . Bleeding gastrointestinal   . Gait disturbance, post-stroke 01/27/2016  . Fall   . Secondary hypertension, unspecified   . Cerebral infarction due to thrombosis of left middle cerebral artery (HCC) s/p mechanical thrombectomy   . Malnutrition of moderate degree 01/09/2016  . GI bleed 01/06/2016  . Chronic atrial fibrillation (HCC) 06/10/2015  . Pulmonary hypertension, secondary (HCC) 06/10/2015  . Chronic anticoagulation 06/10/2015  . HLD (hyperlipidemia) 06/10/2015  . Essential hypertension 06/10/2015  . History of stroke 06/10/2015  . Coronary artery disease due to lipid rich plaque 06/10/2015  . Afib (HCC)   . Elevated uric acid in blood   . Arthritis  of ankle   . Pulmonary HTN (HCC)     , L 05/05/2016, 12:07 PM  [facility] [address] [place], [place], [address] Phone: [phone]   Fax:  [phone]  Name: Adrienne [name] MRN: [id number] Date of Birth: 05/21/1928    , PT,DPT 05/05/16 12:07 PM Phone: [phone] Fax: [phone]   

## 2016-05-05 NOTE — Patient Instructions (Addendum)
Perform in a corner with chair in front of you for safety:  Feet Together (Compliant Surface) Head Motion - Eyes Open    With eyes open, standing on compliant surface: ___pillow/cushion_____, feet together, move head slowly: up and down 10 times and side to side 10 times. Repeat __3__ times per session. Do __1__ sessions per day. PROGRESS TO THICKER PILLOW/CUSHION, AND ONCE THAT BECOMES EASY YOU CAN REDUCE FREQUENCY OF EXERCISES TO 2X/WEEK. FOR ALL BALANCE EXERCISES.   Copyright  VHI. All rights reserved.  Feet Together (Compliant Surface) Head Motion - Eyes Closed    Stand on compliant surface: ____pillow/cushion____ with feet together. Close eyes and move head slowly, up and down 5 times and side to side 5 times.. Repeat _3___ times per session. Do __1__ sessions per day.  Copyright  VHI. All rights reserved.  Feet Together (Compliant Surface) Varied Arm Positions - Eyes Closed    Stand on compliant surface: ____pillow/cushion____ with feet together and arms at your side. Close eyes and visualize upright position. Hold_30___ seconds. Repeat __3__ times per session. Do __1__ sessions per day.  Copyright  VHI. All rights reserved.   ABDUCTION: Standing (Active)    Hold counter for support.  Stand, feet flat. TAKE HALF A STEP BACK WITH KICKING LEG TO INCREASE GLUTE MED. CONTRACTION. Lift right leg out to side.  Alternate legs doing 10 on each side.Perform 3x/week.  http://gtsc.exer.us/111  PROGRESSION (ONCE 10 REPS IS EASY): PERFORM 2 SETS OF 10 REPS ON EACH LEG, ONCE THAT IS EASY PERFORM 3 SETS OF 10 REPS.  Copyright  VHI. All rights reserved.      Leg Flexion    Hold counter for support.  Lift one ankle toward buttocks, keeping knees together and making sure knees don't move towards counter. Slowly return to starting position. Alternate legs doing 10 times on each leg.  Perform 3x/week. PROGRESSION (ONCE 10 REPS IS EASY): PERFORM 2 SETS OF 10 REPS ON EACH LEG,  ONCE THAT IS EASY PERFORM 3 SETS OF 10 REPS.  Copyright  VHI. All rights reserved.  SINGLE LIMB STANCE    Hold counter for support as needed.  Stance: single leg on floor. Raise leg. Hold 10 seconds. Repeat with other leg. Repeat 3 times on each leg once a day. PROGRESSION: WHEN THIS BECOMES EASY, PERFORM ONLY 2X/WEEK.  Copyright  VHI. All rights reserved.   AMBULATION: Side Step    Hover hands on counter but try not holding on.  Step sideways for 10 feet. Repeat in opposite direction. Do 2 reps. Perform 2x/week.  Copyright  VHI. All rights reserved.   AMBULATION: Walk Backward    Holding counter for support as needed..  Walk backwards 10' along counter.  Repeat twice. Perform 2x/week.  Copyright  VHI. All rights reserved.   Functional Quadriceps: Chair Squat    HOLD COUNTER. Keeping feet flat on floor, shoulder width apart, squat as low as is comfortable. Use support as necessary. Repeat __10__ times per set. Do __1__ sets per session. Do __1__ sessions per day. PROGRESSION: PERFORM 2 SETS OF 10 REPS PER DAY.  http://orth.exer.us/737   Copyright  VHI. All rights reserved.    Toe / Heel Raise    Hold counter as needed. Gently rock back on heels (HOLD 5 SECONDS) and then raise toes (HOLD 5 SECONDS).  Repeat sequence __10__ times per session. Do __2__ sessions per week.  Copyright  VHI. All rights reserved.

## 2016-05-06 ENCOUNTER — Other Ambulatory Visit: Payer: Self-pay

## 2016-05-10 ENCOUNTER — Ambulatory Visit: Payer: Medicare Other

## 2016-05-10 DIAGNOSIS — R2689 Other abnormalities of gait and mobility: Secondary | ICD-10-CM

## 2016-05-10 DIAGNOSIS — R278 Other lack of coordination: Secondary | ICD-10-CM | POA: Diagnosis not present

## 2016-05-10 DIAGNOSIS — M6281 Muscle weakness (generalized): Secondary | ICD-10-CM | POA: Diagnosis not present

## 2016-05-10 NOTE — Patient Instructions (Signed)
When going UP the curb: go up with the "good" leg (left leg) and place cane on ground where to support right leg. Then bring right leg up onto curb while raising cane up to join right leg once up on curb.  When doing DOWN the curb: place cane on ground below curb and go down with the "bad" leg (right leg). Then step down with left leg.

## 2016-05-10 NOTE — Therapy (Signed)
[facility] [address] [place], [place], [address] Phone: [phone]   Fax:  [phone]  Physical Therapy Treatment  Patient Details  Name: Adrienne [name] MRN: [id number] Date of Birth: 06/15/1928 Referring Provider: Dr. Letta Pate  Encounter Date: 05/10/2016      PT End of Session - 05/10/16 1241    Visit Number 19   Number of Visits 25   Date for PT Re-Evaluation 05/11/16   Authorization Type G-code and progress note every 10th visit.   PT Start Time 1147   PT Stop Time 1236   PT Time Calculation (min) 49 min   Equipment Utilized During Treatment --  min guard to S prn during BERG   Activity Tolerance Patient tolerated treatment well   Behavior During Therapy [facility] for tasks assessed/performed      Past Medical History:  Diagnosis Date  . Afib ([place])   . Arthritis of ankle   . CAD (coronary artery disease)   . Cancer [facility]) 2012   breast cancer-right  . Compression fracture of T12 vertebra (HCC)   . Diverticulosis   . Edema   . Elevated uric acid in blood   . GI bleed 12/2015  . Hypertension   . Long-term (current) use of anticoagulants   . Moderate mitral regurgitation   . Pulmonary HTN ([place])   . Stroke ([place]) 01/25/2016  . TIA (transient ischemic attack) 09/2011  . Urinary incontinence     Past Surgical History:  Procedure Laterality Date  . APPENDECTOMY    . BREAST SURGERY  2012   Rt.mastectomy--[place], [place]  2013  . CATARACT EXTRACTION, BILATERAL    . DILATION AND CURETTAGE OF UTERUS     in her early 50's for DUB  . ESOPHAGOGASTRODUODENOSCOPY Left 01/08/2016   Procedure: ESOPHAGOGASTRODUODENOSCOPY (EGD);  Surgeon: Arta Silence, MD;  Location[facility];  Service: Endoscopy;  Laterality: Left;  . GALLBLADDER SURGERY    . MASTECTOMY Right   . RADIOLOGY WITH ANESTHESIA N/A 01/25/2016   Procedure: RADIOLOGY WITH ANESTHESIA;  Surgeon: Luanne Bras, MD;   Location: [facility];  Service: Radiology;  Laterality: N/A;  . REPLACEMENT TOTAL KNEE BILATERAL    . TONSILLECTOMY AND ADENOIDECTOMY     as a child    There were no vitals filed for this visit.      Subjective Assessment - 05/10/16 1150    Subjective Pt denied falls or changes since last visit.    Pertinent History A-fib, pulmonary HTN, HTN, hx of R breast CA in 2012, Hx of T12 compression fx (within the last year) but denied precautions, R ankle OA, Hx of GI hemorrhage with diverticulosis, CAD   Patient Stated Goals Walk without any assistance   Currently in Pain? No/denies          05/10/16 1213  Ambulation/Gait  Ambulation/Gait Yes  Ambulation/Gait Assistance 6: Modified independent (Device/Increase time)  Ambulation/Gait Assistance Details No incr. in postural sway or LOB during amb.  Ambulation Distance (Feet) 500 Feet, 75'x2, 50'x2, 1066'  Assistive device Straight cane  Gait Pattern Step-through pattern;Decreased stride length;Decreased step length - left  Ambulation Surface Level;Unlevel;Indoor;Outdoor;Paved  Gait velocity 2.16f/sec.  Curb 5: Supervision  Curb Details (indicate cue type and reason) x3 reps, cues for technique "up with the "good" leg and down with the "bad" leg".                [facility] - 05/10/16 1213  Ambulation/Gait   Ambulation/Gait Yes, please see above note for gait.   Ambulation Distance (Feet) 500 Feet   Assistive device Straight cane   Ambulation Surface Level;Unlevel;Indoor;Outdoor;Paved   Gait velocity 2.18f/sec.     Standardized Balance Assessment   Standardized Balance Assessment Berg Balance Test;Timed Up and Go Test     Berg Balance Test   Sit to Stand Able to stand without using hands and stabilize independently   Standing Unsupported Able to stand safely 2 minutes   Sitting with Back Unsupported but Feet Supported on Floor or Stool Able to sit safely and securely 2 minutes   Stand to Sit Sits  safely with minimal use of hands   Transfers Able to transfer safely, minor use of hands   Standing Unsupported with Eyes Closed Able to stand 10 seconds safely   Standing Ubsupported with Feet Together Able to place feet together independently and stand 1 minute safely   From Standing, Reach Forward with Outstretched Arm Can reach confidently >25 cm (10")   From Standing Position, Pick up Object from Floor Able to pick up shoe safely and easily   From Standing Position, Turn to Look Behind Over each Shoulder Looks behind one side only/other side shows less weight shift   Turn 360 Degrees Able to turn 360 degrees safely in 4 seconds or less   Standing Unsupported, Alternately Place Feet on Step/Stool Able to complete 4 steps without aid or supervision   Standing Unsupported, One Foot in Front Able to plae foot ahead of the other independently and hold 30 seconds   Standing on One Leg Able to lift leg independently and hold > 10 seconds   Total Score 52                PT Education - 05/10/16 1152    Education provided Yes   Education Details Pt was able to name CVA risk factors and signs/sx's.  PT discussed goal progress and d/c, pt agreeable.   Person(s) Educated Patient   Methods Explanation   Comprehension Verbalized understanding          PT Short Term Goals - 03/11/16 1127      PT SHORT TERM GOAL #1   Title Pt will be IND in HEP in order to improve balance and strength. Target date: 03/09/16   Baseline met    Status Achieved     PT SHORT TERM GOAL #2   Title Pt will improve BERG score to >/=47/56 to decr. falls risk. Target date: 03/09/16   Baseline 46/56 on 03/11/16    Status Partially Met     PT SHORT TERM GOAL #3   Title Pt will perform TUG with LRAD in </=13.5sec. to decr. falls risk. Target date: 03/09/16   Baseline 12.85 secs with RW   Status Achieved     PT SHORT TERM GOAL #4   Title Pt will amb. 300' over even terrain with LRAD at MOD I level to improve  functional mobilty. Target date: 03/09/16   Baseline mod I with RW, S with cane (provided education to begin gait with SPC at home with S from husband)   Status Achieved           PT Long Term Goals - 05/10/16 1242      PT LONG TERM GOAL #1   Title Pt will verbalize understanding of CVA symptoms/risk factors to decr. risk of additonal CVA. Target date: 04/06/16   Baseline All unmet goals will be carried  over to new POCl 05/05/16   Status Achieved     PT LONG TERM GOAL #2   Title Pt will improve BERG score to >/=51/56 to decr. falls risk. Target date: 04/06/16   Status Achieved     PT LONG TERM GOAL #3   Title Pt will be able to ascend/descend one step without handrail, with LRAD, at MOD I level to traverse step at home. Target date: 04/06/16   Status Partially Met     PT LONG TERM GOAL #4   Title Pt will amb. 500' over even/uneven terrain with LRAD at MOD I level to improve functional mobility. Target date: 04/06/16   Status Achieved     PT LONG TERM GOAL #5   Title Pt will improve gait speed with SPC to >/=2.31f/sec. to safely amb. in the community. Target date: 04/06/16   Baseline Met 03/31/16 with RW, so [facility] to [facility]  Status Achieved     PT LONG TERM GOAL #6   Title Pt will improve 6MWT distance to 1015' to improve endurance. Target date: 05/05/16   Baseline 815'   Status Achieved               Plan - [id number]    Clinical Impression Statement Pt met LTGs 1, 2, 4, 5, and 6. Pt partially met LTG 3 as she required cues for sequencing and technique. Please see d/c summary for details.      Patient will benefit from skilled therapeutic intervention in order to improve the following deficits and impairments:     Visit Diagnosis: Other abnormalities of gait and mobility  Muscle weakness (generalized)  Other lack of coordination       G-Codes - 009/06/[id number]    Functional Assessment Tool Used BERG: 52/56; gait speed with SPC: 2.720fsec; 6MWT with SPC:  1066'   Functional Limitation Mobility: Walking and moving around   Mobility: Walking and Moving Around Goal Status ([id number] least 1 percent but less than 20 percent impaired, limited or restricted   Mobility: Walking and Moving Around Discharge Status ([id number] least 1 percent but less than 20 percent impaired, limited or restricted      Problem List Patient Active Problem List   Diagnosis Date Noted  . Diplopia 01/29/2016  . GERD (gastroesophageal reflux disease) 01/29/2016  . Cerebrovascular accident (CVA) due to embolism of left middle cerebral artery ([facility]05/19/2017  . Persistent atrial fibrillation ([facility]  . Bleeding gastrointestinal   . Gait disturbance, post-stroke 01/27/2016  . Fall   . Secondary hypertension, unspecified   . Cerebral infarction due to thrombosis of left middle cerebral artery (HCC) s/p mechanical thrombectomy   . Malnutrition of moderate degree 01/09/2016  . GI bleed 01/06/2016  . Chronic atrial fibrillation ([facility]CRegan09/28/2016  . Pulmonary hypertension, secondary (HCMount [facility]09/28/2016  . Chronic anticoagulation 06/10/2015  . HLD (hyperlipidemia) 06/10/2015  . Essential hypertension 06/10/2015  . History of stroke 06/10/2015  . Coronary artery disease due to lipid rich plaque 06/10/2015  . Afib ([facility]  . Elevated uric acid in blood   . Arthritis of ankle   . Pulmonary HTN ([facility]    Jayleen Scaglione L 8/06-Sep-201712:44 PM  [address]uMontaguer[place]gent[place]hone: [phone] Fax:  [phone]: Adrienne BiddingerRN: [id number] of Birth: [birth year] PHYSICAL THERAPY DISCHARGE SUMMARY  Visits from Start of Care: 19  Current functional level related  to goals / functional outcomes:     PT Long Term Goals - 05/10/16 1242      PT LONG TERM GOAL #1   Title Pt will verbalize understanding of CVA symptoms/risk factors to decr. risk of additonal CVA. Target  date: 04/06/16   Baseline All unmet goals will be carried over to new POCl 05/05/16   Status Achieved     PT LONG TERM GOAL #2   Title Pt will improve BERG score to >/=51/56 to decr. falls risk. Target date: 04/06/16   Status Achieved     PT LONG TERM GOAL #3   Title Pt will be able to ascend/descend one step without handrail, with LRAD, at MOD I level to traverse step at home. Target date: 04/06/16   Status Partially Met     PT LONG TERM GOAL #4   Title Pt will amb. 500' over even/uneven terrain with LRAD at MOD I level to improve functional mobility. Target date: 04/06/16   Status Achieved     PT LONG TERM GOAL #5   Title Pt will improve gait speed with SPC to >/=2.87f/sec. to safely amb. in the community. Target date: 04/06/16   Baseline Met 03/31/16 with RW, so [facility] to [facility]  Status Achieved     PT LONG TERM GOAL #6   Title Pt will improve 6MWT distance to 1015' to improve endurance. Target date: 05/05/16   Baseline 815'   Status Achieved        Remaining deficits: Decreased endurance   Education / Equipment: HEP  Plan: Patient agrees to discharge.  Patient goals were met. Patient is being discharged due to meeting the stated rehab goals.  ?????          JGeoffry Paradise PT,DPT 05/10/16 12:46 PM Phone: [phone]: [phone]

## 2016-05-12 ENCOUNTER — Ambulatory Visit: Payer: Medicare Other | Admitting: Physical Therapy

## 2016-05-25 DIAGNOSIS — N3946 Mixed incontinence: Secondary | ICD-10-CM | POA: Diagnosis not present

## 2016-05-25 DIAGNOSIS — N3020 Other chronic cystitis without hematuria: Secondary | ICD-10-CM | POA: Diagnosis not present

## 2016-05-27 ENCOUNTER — Ambulatory Visit (INDEPENDENT_AMBULATORY_CARE_PROVIDER_SITE_OTHER): Payer: Medicare Other | Admitting: Obstetrics and Gynecology

## 2016-05-27 ENCOUNTER — Telehealth: Payer: Self-pay | Admitting: Obstetrics and Gynecology

## 2016-05-27 ENCOUNTER — Telehealth: Payer: Self-pay | Admitting: Neurology

## 2016-05-27 ENCOUNTER — Ambulatory Visit: Payer: Medicare Other | Admitting: Obstetrics and Gynecology

## 2016-05-27 ENCOUNTER — Encounter: Payer: Self-pay | Admitting: Obstetrics and Gynecology

## 2016-05-27 VITALS — BP 104/60 | HR 76 | Resp 16 | Ht 62.0 in | Wt 147.0 lb

## 2016-05-27 DIAGNOSIS — N813 Complete uterovaginal prolapse: Secondary | ICD-10-CM | POA: Diagnosis not present

## 2016-05-27 DIAGNOSIS — I639 Cerebral infarction, unspecified: Secondary | ICD-10-CM | POA: Diagnosis not present

## 2016-05-27 DIAGNOSIS — N939 Abnormal uterine and vaginal bleeding, unspecified: Secondary | ICD-10-CM

## 2016-05-27 NOTE — Telephone Encounter (Signed)
Emily/ Dr. Judeth Horn, [facility] office called needing medical clearance for pt to have a removal procedure. Please call [phone], Pt scheduled for Monday , 9/18.

## 2016-05-27 NOTE — Progress Notes (Signed)
GYNECOLOGY  VISIT   HPI: 82 y.o.   Married  Caucasian  female   [phone] with Patient's last menstrual period was 09/12/1973 (approximate).   here for   Pessary check  Places and removes the Pessary twice a week.  States the right side is always tender.   Has had some spotting all the time when she voids.  Noted blood twice this week.  She does not think it is coming from the urine.   Had a normal pelvic ultrasound. EMS 0.94 mm. Last pap was November 25, 2015 - normal.   On abx for UTIs.  On Elaquis. Dr. Erlinda Hong prescribing.  Has atrial fibrillation.   Grandson coming from [place].   GYNECOLOGIC HISTORY: Patient's last menstrual period was 09/12/1973 (approximate). Contraception:  Post menopausal Menopausal hormone therapy:  none Last mammogram:  07/2014  Normal in [place] per patient (hx Rt. Breast CA w/Rt. Mastectomy) Last pap smear:   11/25/15 Pap Smear normal        OB History    Gravida Para Term Preterm AB Living   3 3 3     3    SAB TAB Ectopic Multiple Live Births                     Patient Active Problem List   Diagnosis Date Noted  . Diplopia 01/29/2016  . GERD (gastroesophageal reflux disease) 01/29/2016  . Cerebrovascular accident (CVA) due to embolism of left middle cerebral artery ([place]) 01/29/2016  . Persistent atrial fibrillation ([place])   . Bleeding gastrointestinal   . Gait disturbance, post-stroke 01/27/2016  . Fall   . Secondary hypertension, unspecified   . Cerebral infarction due to thrombosis of left middle cerebral artery (HCC) s/p mechanical thrombectomy   . Malnutrition of moderate degree 01/09/2016  . GI bleed 01/06/2016  . Chronic atrial fibrillation ([place]) 06/10/2015  . Pulmonary hypertension, secondary ([place]) 06/10/2015  . Chronic anticoagulation 06/10/2015  . HLD (hyperlipidemia) 06/10/2015  . Essential hypertension 06/10/2015  . History of stroke 06/10/2015  . Coronary artery disease due to lipid rich plaque 06/10/2015  . Afib ([place])   . Elevated  uric acid in blood   . Arthritis of ankle   . Pulmonary HTN ([place])     Past Medical History:  Diagnosis Date  . Afib ([facility])   . Arthritis of ankle   . CAD (coronary artery disease)   . Cancer [facility]) 2012   breast cancer-right  . Compression fracture of T12 vertebra (HCC)   . Diverticulosis   . Edema   . Elevated uric acid in blood   . GI bleed 12/2015  . Hypertension   . Long-term (current) use of anticoagulants   . Moderate mitral regurgitation   . Pulmonary HTN ([place])   . Stroke ([place]) 01/25/2016  . TIA (transient ischemic attack) 09/2011  . Urinary incontinence     Past Surgical History:  Procedure Laterality Date  . APPENDECTOMY    . BREAST SURGERY  2012   Rt.mastectomy--[place], [place]  2013  . CATARACT EXTRACTION, BILATERAL    . DILATION AND CURETTAGE OF UTERUS     in her early 76's for DUB  . ESOPHAGOGASTRODUODENOSCOPY Left 01/08/2016   Procedure: ESOPHAGOGASTRODUODENOSCOPY (EGD);  Surgeon: Arta Silence, MD;  Location: [facility];  Service: Endoscopy;  Laterality: Left;  . GALLBLADDER SURGERY    . MASTECTOMY Right   . RADIOLOGY WITH ANESTHESIA N/A 01/25/2016   Procedure: RADIOLOGY WITH ANESTHESIA;  Surgeon: Luanne Bras,  MD;  Location: [place];  Service: Radiology;  Laterality: N/A;  . REPLACEMENT TOTAL KNEE BILATERAL    . TONSILLECTOMY AND ADENOIDECTOMY     as a child    Current Outpatient Prescriptions  Medication Sig Dispense Refill  . acetaminophen (TYLENOL) 500 MG tablet Take 500-1,000 mg by mouth every 12 (twelve) hours as needed (pain).     Marland Kitchen apixaban (ELIQUIS) 5 MG TABS tablet Take 1 tablet (5 mg total) by mouth 2 (two) times daily. 60 tablet 1  . CALCIUM PO Take 1 tablet by mouth 2 (two) times daily.     Marland Kitchen CARAFATE 1 GM/10ML suspension Take 10 mLs by mouth 2 (two) times daily.    . Cephalexin (KEFLEX PO) Take 125 mg by mouth 1 day or 1 dose.    . Cholecalciferol (VITAMIN D PO) Take 1 tablet by mouth daily.     Marland Kitchen diltiazem  (CARDIZEM) 60 MG tablet Take 1 tablet (60 mg total) by mouth 3 (three) times daily. 270 tablet 3  . loperamide (IMODIUM) 2 MG capsule Take by mouth as needed for diarrhea or loose stools.    . metoprolol (LOPRESSOR) 100 MG tablet Take 1 tablet (100 mg total) by mouth 2 (two) times daily. 180 tablet 3  . Multiple Vitamins-Minerals (MULTIVITAMIN ADULT PO) Take by mouth.    Marland Kitchen omeprazole (PRILOSEC) 40 MG capsule Take 40 mg by mouth daily.     . vitamin C (ASCORBIC ACID) 500 MG tablet Take 500 mg by mouth daily. Takes  2 dailu     No current facility-administered medications for this visit.      ALLERGIES: Amiodarone hcl [amiodarone]; Compazine [prochlorperazine]; Other; Thorazine [chlorpromazine]; Valium [diazepam]; and Zometa [zoledronic acid]  Family History  Problem Relation Age of Onset  . Asthma Mother   . CVA Father   . Atrial fibrillation Sister     HAS PACER  . Pneumonia Brother   . Cancer Brother     BREAST CANCER  . Cancer Daughter     COLON CANCER    Social History   Social History  . Marital status: Married    Spouse name: N/A  . Number of children: N/A  . Years of education: N/A   Occupational History  . Not on file.   Social History Main Topics  . Smoking status: Former Smoker    Types: Cigarettes  . Smokeless tobacco: Never Used  . Alcohol use No  . Drug use: No  . Sexual activity: Yes    Partners: Male    Birth control/ protection: Post-menopausal     Comment: IN COLLEGE   Other Topics Concern  . Not on file   Social History Narrative  . No narrative on file    ROS:  Pertinent items are noted in HPI.  PHYSICAL EXAMINATION:    BP 104/60 (BP Location: Right Arm, Patient Position: Sitting, Cuff Size: Normal)   Pulse 76   Resp 16   Ht 5\' 2"  (1.575 m)   Wt 147 lb (66.7 kg)   LMP 09/12/1973 (Approximate)   BMI 26.89 kg/m     General appearance: alert, cooperative and appears stated age   Pelvic: External genitalia:  Mild blood staining on the  right labia pubic hair.                Urethra:  normal appearing urethra with no masses, tenderness or lesions.  Mild urethral prolapse.  Bartholins and Skenes: normal                 Vagina: normal appearing vagina with normal color and discharge, no lesions.  Thinning of right hymenal area where tissue is absent likely following delivery.  Recent bleeding?              Cervix: no lesions                Bimanual Exam:  Uterus:  normal size, contour, position, consistency, mobility, non-tender              Adnexa: no mass, fullness, tenderness        Chaperone was present for exam.  ASSESSMENT  Recurrent vaginal bleeding.  Previous ultrasound showing thin EMS. Pessary use.  UTIs followed by urology.  On Eliquis for atrial fibrillation.  Had a stroke when she stopped anticoagulation.    PLAN  I discussed with the patient the possibilities of etiologies of vaginal bleeding - pessary, intrauterine, trauma while on Eliquis (could be from wiping the urethra). We did discuss proceeding with endometrial biopsy to rule out intrauterine pathology given the recurrent nature of her bleeding.  Office will contact her neurologist regarding this procedure given her use of the Eliquis which she does not want to stop. Needs to use KY jelly for moisturizing with pessary use to avoid trauma to tissues.   An After Visit Summary was printed and given to the patient.  _1_____ minutes face to face time of which over 50% was spent in counseling.

## 2016-05-27 NOTE — Telephone Encounter (Signed)
Spoke with [facility] at [facility], Dr. Phoebe Sharps office. Message given from Dr. Quincy Simmonds. Message was sent to nurse and Dr. Erlinda Hong, who is working in the hospital this week and they will return call to our office with response.  Routing to provider for review.

## 2016-05-27 NOTE — Telephone Encounter (Signed)
Patient is having recurrent vaginal bleeding in menopause.  I would like to do an endometrial biopsy to rule out uterine source of the bleeding.  She is currently in Eliquis, and she does not want to stop this.  There usually is some light bright red bleeding associated with this.  I need direction from Dr. Erlinda Hong, or colleague, regarding this. She is scheduled to return on Monday, Sept. 18.

## 2016-05-30 NOTE — Telephone Encounter (Signed)
Patient called checking status on procedure. Updated her on information exchange between Dr Erlinda Hong and Dr Quincy Simmonds expected tomorrow at earliest. Aware it may be Wednesday when we can reach out to update treatment plan. She is agreeable.  Patient states she is very uncomfortable stopping the Eliquis because her previous attempt to stop it resulted in a stroke.   She also states they received word of a death in the family out of state and may have to leave on Wednesday. Requests use mobile number for communication.  [phone]

## 2016-05-30 NOTE — Telephone Encounter (Signed)
Spoke with Adrienne [name] at [facility], with Dr.Xu's office. Adrienne [name] is calling to verify procedure patient will be having performed. Advised Dr.Silva would like to perform an EMB to rule out uterine source of recurrent vaginal bleeding. Advised the patient does not want to discontinue her Eliquis prior to the procedure. Advised there is usually light bleeding with EMB and would like recommendations regarding Eliquis before her procedure. Per Adrienne [name] she will speak with Dr.Xu and have recommendations faxed to our office tomorrow for Dr.Silva's review.

## 2016-05-30 NOTE — Telephone Encounter (Signed)
[facility] returned call this morning to discuss treatment plan with some additional questions. Kaitlyn aware and will contact their office to discuss.  Patient called to check status. Becky in insurance/referrals gave benefit information and explained that once the nurses at our office and [facility] complete all questions our nurse will call her with treatment plan.  Patient agreeable to return call.

## 2016-05-30 NOTE — Telephone Encounter (Addendum)
Return call to [place] with [facility], Dr.Xu's office. Spoke with front desk representative who would like to know if the patient is scheduled for her appointment today. Advised the patient has not been scheduled at this time as we wanted to ensure we discussed recommendations regarding patient's Eliquis. Advised Dr.Silva would like to perform this today if possible, if not as soon as possible. Front desk representative verbalizes understanding and will have Katrina return call to office.

## 2016-05-30 NOTE — Telephone Encounter (Signed)
Rn call Dr. Anastasia Fiedler office regarding pts medical clearance. Rn ask to speak with Adrienne [name] and she was unavailable. Rn left message to find out if patient was having the procedure today or another time. Rn left contact number and name for [place].

## 2016-05-30 NOTE — Telephone Encounter (Signed)
Rn call Katlyn at Dr. Silvestre Mesi office. Rn ask what procedure was the patient having. Katlyn stated pt is having a biopsy procedure to rule out. Katlyn stated the patient does not want to sop the Eliquis. Rn stated a letter will be fax to [phone] Attention Katlyn. Katlyn verbalized understanding.

## 2016-05-30 NOTE — Telephone Encounter (Signed)
Adrienne [name]/ Dr. Salena Saner office returned call , per previous note by nurse I asked about pts schedule appt. She states pt has not been scheduled as of yet they are waiting on our office for approval.

## 2016-06-01 NOTE — Telephone Encounter (Signed)
Called pt 2 days ago about the eliquis management with incoming procedure. She wanted me to talk to her OBGYN Dr. Josefa Half about it. I called Dr. Quincy Simmonds at noon time and discussed with her the best regimen for eliquis in the setting of incoming uterus biopsy. We agree that the procedure likely to fall into low risk bleeding category and Dr. Quincy Simmonds is going to take small sample as able. We feel that stop eliquis 24 hours before the procedure may likely enough, and eliquis can be restarted once hemodynamically stable post procedure. I then called pt back and explained to pt and she expressed understanding and appreciation.  Rosalin Hawking, MD PhD Stroke Neurology 06/01/2016 12:42 PM

## 2016-06-01 NOTE — Telephone Encounter (Signed)
I have just spoken with Dr. Erlinda Hong, the patient's neurologist.  Eliquis is usually stopped 48 hour prior to high risk procedures. In this circumstance, Dr. Erlinda Hong states that stopping Eliquis 24 hour prior to procedure is appropriate.  It may be started the evening of the procedure at night time if bleeding is not excessive following procedure.   Plan could be as follows: Take last dose of Eliquis on Sunday.  Come in to office on Monday morning for the endometrial biopsy. Restart the Eliquis in the evening on Monday if no excessive bleeding following the procedure.   Dr. Erlinda Hong will contact the patient to explain the protocol and reasoning for stopping the Eliquis short term.   Patient will need phone call to coordinate the appointment at this office for the EMB.  Cc- Marisa Sprinkles

## 2016-06-02 ENCOUNTER — Ambulatory Visit (INDEPENDENT_AMBULATORY_CARE_PROVIDER_SITE_OTHER): Payer: Medicare Other | Admitting: Obstetrics and Gynecology

## 2016-06-02 ENCOUNTER — Encounter: Payer: Self-pay | Admitting: Obstetrics and Gynecology

## 2016-06-02 VITALS — BP 132/80 | HR 70 | Resp 16 | Ht 62.0 in | Wt 147.0 lb

## 2016-06-02 DIAGNOSIS — N950 Postmenopausal bleeding: Secondary | ICD-10-CM | POA: Diagnosis not present

## 2016-06-02 DIAGNOSIS — N812 Incomplete uterovaginal prolapse: Secondary | ICD-10-CM

## 2016-06-02 DIAGNOSIS — I639 Cerebral infarction, unspecified: Secondary | ICD-10-CM

## 2016-06-02 NOTE — Progress Notes (Signed)
GYNECOLOGY  VISIT   HPI: 82 y.o.   Married  Caucasian  female   [phone] with Patient's last menstrual period was 09/12/1973 (approximate).   here for discussion regarding endometrial biopsy. We did receive clearance from Dr.Jindong Xu to hold Eliquis for 24 hours prior to biopsy but patient still has more questions for Dr. Quincy Simmonds before scheduling procedure.  Patient is very worried about stopping the Eliquis due to her hx of prior major stroke when stopping coumadin.  She feels fortunate to have recovered.  Has done crowns and caries treatment and cleanings of teeth without stopping Eliquis.  Had a trial of leaving pessary out this week after her visit last week.  Out for 4 days.  Applying the KY jelly and into the vagina partway.  Feels a roughness at 10:00. Did have spotting one time yesterday.   GYNECOLOGIC HISTORY: Patient's last menstrual period was 09/12/1973 (approximate). Contraception:  Postmenopausal Menopausal hormone therapy:  none Last mammogram:  07/2014 normal in [place] per patient(hx Rt.Br.ca w/Rt.mastectomy) Last pap smear:   11-25-15 normal per patient         OB History    Gravida Para Term Preterm AB Living   3 3 3     3    SAB TAB Ectopic Multiple Live Births                     Patient Active Problem List   Diagnosis Date Noted  . Diplopia 01/29/2016  . GERD (gastroesophageal reflux disease) 01/29/2016  . Cerebrovascular accident (CVA) due to embolism of left middle cerebral artery ([place]) 01/29/2016  . Persistent atrial fibrillation ([place])   . Bleeding gastrointestinal   . Gait disturbance, post-stroke 01/27/2016  . Fall   . Secondary hypertension, unspecified   . Cerebral infarction due to thrombosis of left middle cerebral artery (HCC) s/p mechanical thrombectomy   . Malnutrition of moderate degree 01/09/2016  . GI bleed 01/06/2016  . Chronic atrial fibrillation ([place]) 06/10/2015  . Pulmonary hypertension, secondary ([place]) 06/10/2015  . Chronic  anticoagulation 06/10/2015  . HLD (hyperlipidemia) 06/10/2015  . Essential hypertension 06/10/2015  . History of stroke 06/10/2015  . Coronary artery disease due to lipid rich plaque 06/10/2015  . Afib ([place])   . Elevated uric acid in blood   . Arthritis of ankle   . Pulmonary HTN ([place])     Past Medical History:  Diagnosis Date  . Afib ([place])   . Arthritis of ankle   . CAD (coronary artery disease)   . Cancer [facility]) 2012   breast cancer-right  . Compression fracture of T12 vertebra (HCC)   . Diverticulosis   . Edema   . Elevated uric acid in blood   . GI bleed 12/2015  . Hypertension   . Long-term (current) use of anticoagulants   . Moderate mitral regurgitation   . Pulmonary HTN ([place])   . Stroke ([place]) 01/25/2016  . TIA (transient ischemic attack) 09/2011  . Urinary incontinence     Past Surgical History:  Procedure Laterality Date  . APPENDECTOMY    . BREAST SURGERY  2012   Rt.mastectomy--[place], [place]  2013  . CATARACT EXTRACTION, BILATERAL    . DILATION AND CURETTAGE OF UTERUS     in her early 4's for DUB  . ESOPHAGOGASTRODUODENOSCOPY Left 01/08/2016   Procedure: ESOPHAGOGASTRODUODENOSCOPY (EGD);  Surgeon: Arta Silence, MD;  Location: [facility];  Service: Endoscopy;  Laterality: Left;  . GALLBLADDER SURGERY    .  MASTECTOMY Right   . RADIOLOGY WITH ANESTHESIA N/A 01/25/2016   Procedure: RADIOLOGY WITH ANESTHESIA;  Surgeon: Luanne Bras, MD;  Location: [place];  Service: Radiology;  Laterality: N/A;  . REPLACEMENT TOTAL KNEE BILATERAL    . TONSILLECTOMY AND ADENOIDECTOMY     as a child    Current Outpatient Prescriptions  Medication Sig Dispense Refill  . acetaminophen (TYLENOL) 500 MG tablet Take 500-1,000 mg by mouth every 12 (twelve) hours as needed (pain).     Marland Kitchen apixaban (ELIQUIS) 5 MG TABS tablet Take 1 tablet (5 mg total) by mouth 2 (two) times daily. 60 tablet 1  . CALCIUM PO Take 1 tablet by mouth 2 (two) times daily.      Marland Kitchen CARAFATE 1 GM/10ML suspension Take 10 mLs by mouth 2 (two) times daily.    . Cephalexin (KEFLEX PO) Take 125 mg by mouth 1 day or 1 dose.    . Cholecalciferol (VITAMIN D PO) Take 1 tablet by mouth daily.     Marland Kitchen diltiazem (CARDIZEM) 60 MG tablet Take 1 tablet (60 mg total) by mouth 3 (three) times daily. 270 tablet 3  . loperamide (IMODIUM) 2 MG capsule Take by mouth as needed for diarrhea or loose stools.    . metoprolol (LOPRESSOR) 100 MG tablet Take 1 tablet (100 mg total) by mouth 2 (two) times daily. 180 tablet 3  . Multiple Vitamins-Minerals (MULTIVITAMIN ADULT PO) Take by mouth.    Marland Kitchen omeprazole (PRILOSEC) 40 MG capsule Take 40 mg by mouth daily.     Marland Kitchen PROAIR HFA 108 (90 Base) MCG/ACT inhaler as needed.     . Red Yeast Rice Extract (RED YEAST RICE PO) Take by mouth daily.    . vitamin C (ASCORBIC ACID) 500 MG tablet Take 500 mg by mouth daily. Takes  2 dailu     No current facility-administered medications for this visit.      ALLERGIES: Amiodarone hcl [amiodarone]; Compazine [prochlorperazine]; Other; Thorazine [chlorpromazine]; Valium [diazepam]; and Zometa [zoledronic acid]  Family History  Problem Relation Age of Onset  . Asthma Mother   . CVA Father   . Atrial fibrillation Sister     HAS PACER  . Pneumonia Brother   . Cancer Brother     BREAST CANCER  . Cancer Daughter     COLON CANCER    Social History   Social History  . Marital status: Married    Spouse name: N/A  . Number of children: N/A  . Years of education: N/A   Occupational History  . Not on file.   Social History Main Topics  . Smoking status: Former Smoker    Types: Cigarettes  . Smokeless tobacco: Never Used  . Alcohol use No  . Drug use: No  . Sexual activity: Yes    Partners: Male    Birth control/ protection: Post-menopausal   Other Topics Concern  . Not on file   Social History Narrative  . No narrative on file    ROS:  Pertinent items are noted in HPI.  PHYSICAL EXAMINATION:     BP 132/80   Pulse 70   Resp 16   Ht 5\' 2"  (1.575 m)   Wt 147 lb (66.7 kg)   LMP 09/12/1973 (Approximate) Comment: spotting  BMI 26.89 kg/m     General appearance: alert, cooperative and appears stated age  Pelvic: External genitalia:  no lesions              Urethra:  normal appearing  urethra with no masses, tenderness or lesions              Bartholins and Skenes: normal                 Vagina: normal appearing vagina with normal color and discharge, no lesions.  Almost third degree cystocele and almost second degree uterine prolapse.  No vaginal lesions.              Cervix: no lesions                Bimanual Exam:  Uterus:  normal size, contour, position, consistency, mobility, non-tender              Adnexa: no mass, fullness, tenderness              Chaperone was present for exam.  ASSESSMENT  Recurrent and persistent postmenopausal bleeding.  Thin endometrial stripe on ultrasound.  The bleeding may be due to vaginal atrophy, pessary use and Eliquis. Hyperplasia and cancer are less likely. I don't see any vaginal irritation to explain the bleeding, however.  PLAN  We had a comprehensive discussion of postmenopausal bleeding, atrophy, and anticoagulants.  We discussed endometrial biopsy in detail.  Will proceed with EMB without stopping the Eliquis and use a very small catheter.  I am not planning on using a paracervical block and will try to avoid use of a tenaculum. Patient is much relieved to hear this is an option as she is very concerned about stopping the Eliquis. OK to place pessary again at home.  Not a candidate for vaginal estrogen usage.  An After Visit Summary was printed and given to the patient.  __25____ minutes face to face time of which over 50% was spent in counseling.

## 2016-06-03 ENCOUNTER — Telehealth: Payer: Self-pay | Admitting: Obstetrics and Gynecology

## 2016-06-03 NOTE — Telephone Encounter (Signed)
Patient was seen in the office on 06/02/2016 for consultation with Dr.Silva regarding EMB (please see OV note). Patient is scheduled for EMB on 06/08/2016 with Dr.Silva.   Routing to provider for final review. Patient agreeable to disposition. Will close encounter.

## 2016-06-08 ENCOUNTER — Encounter: Payer: Self-pay | Admitting: Obstetrics and Gynecology

## 2016-06-08 ENCOUNTER — Ambulatory Visit (INDEPENDENT_AMBULATORY_CARE_PROVIDER_SITE_OTHER): Payer: Medicare Other | Admitting: Obstetrics and Gynecology

## 2016-06-08 VITALS — BP 138/80 | HR 70 | Ht 62.0 in | Wt 147.0 lb

## 2016-06-08 DIAGNOSIS — N858 Other specified noninflammatory disorders of uterus: Secondary | ICD-10-CM | POA: Diagnosis not present

## 2016-06-08 DIAGNOSIS — N950 Postmenopausal bleeding: Secondary | ICD-10-CM | POA: Diagnosis not present

## 2016-06-08 DIAGNOSIS — I639 Cerebral infarction, unspecified: Secondary | ICD-10-CM | POA: Diagnosis not present

## 2016-06-08 DIAGNOSIS — N939 Abnormal uterine and vaginal bleeding, unspecified: Secondary | ICD-10-CM | POA: Diagnosis not present

## 2016-06-08 DIAGNOSIS — N719 Inflammatory disease of uterus, unspecified: Secondary | ICD-10-CM | POA: Diagnosis not present

## 2016-06-08 NOTE — Patient Instructions (Signed)
Endometrial Biopsy, Care After Refer to this sheet in the next few weeks. These instructions provide you with information on caring for yourself after your procedure. Your health care provider may also give you more specific instructions. Your treatment has been planned according to current medical practices, but problems sometimes occur. Call your health care provider if you have any problems or questions after your procedure. WHAT TO EXPECT AFTER THE PROCEDURE After your procedure, it is typical to have the following:  You may have mild cramping and a small amount of vaginal bleeding for a few days after the procedure. This is normal. HOME CARE INSTRUCTIONS  Only take over-the-counter or prescription medicine as directed by your health care provider.  Do not douche, use tampons, or have sexual intercourse until your health care provider approves.  Follow your health care provider's instructions regarding any activity restrictions, such as strenuous exercise or heavy lifting. SEEK MEDICAL CARE IF:  You have heavy bleeding or bleeding longer than 2 days after the procedure.  You have bad smelling drainage from your vagina.  You have a fever and chills.  Youhave severe lower stomach (abdominal) pain. SEEK IMMEDIATE MEDICAL CARE IF:  You have severe cramps in your stomach or back.  You pass large blood clots.  Your bleeding increases.  You become weak or lightheaded, or you pass out.   This information is not intended to replace advice given to you by your health care provider. Make sure you discuss any questions you have with your health care provider.   Document Released: 06/19/2013 Document Reviewed: 06/19/2013 Elsevier Interactive Patient Education 2016 [employer]  

## 2016-06-08 NOTE — Progress Notes (Signed)
GYNECOLOGY  VISIT   HPI: 82 y.o.   Married  Caucasian  female   [phone] with Patient's last menstrual period was 09/12/1973 (approximate).   here for endometrial biopsy. Patient has not stopped Eliquis as discussed at last office visit.  Has recurrent postmenopausal bleeding.  Prior ultrasound showing EMS thin, measuring 0.94 mm.  Pap 11/25/15 normal.   Has been using her pessary.  There is dried blood there.   Takes Eliquis 5 mg po bid.  Took last pill at 7:00.   Took Tylenol 500 mg, two tablets prior to office visit today.   Hx of stroke when stopped Coumadin in the past.   GYNECOLOGIC HISTORY: Patient's last menstrual period was 09/12/1973 (approximate). Contraception:  Postmenopausal Menopausal hormone therapy:  none Last mammogram: 07/2014 normal in [place] per patient(hx Rt.Br.ca w/Rt.mastectomy)  Last pap smear:  11-25-15 normal per patient          OB History    Gravida Para Term Preterm AB Living   3 3 3     3    SAB TAB Ectopic Multiple Live Births                     Patient Active Problem List   Diagnosis Date Noted  . Diplopia 01/29/2016  . GERD (gastroesophageal reflux disease) 01/29/2016  . Cerebrovascular accident (CVA) due to embolism of left middle cerebral artery ([place]) 01/29/2016  . Persistent atrial fibrillation ([place])   . Bleeding gastrointestinal   . Gait disturbance, post-stroke 01/27/2016  . Fall   . Secondary hypertension, unspecified   . Cerebral infarction due to thrombosis of left middle cerebral artery (HCC) s/p mechanical thrombectomy   . Malnutrition of moderate degree 01/09/2016  . GI bleed 01/06/2016  . Chronic atrial fibrillation ([place]) 06/10/2015  . Pulmonary hypertension, secondary ([place]) 06/10/2015  . Chronic anticoagulation 06/10/2015  . HLD (hyperlipidemia) 06/10/2015  . Essential hypertension 06/10/2015  . History of stroke 06/10/2015  . Coronary artery disease due to lipid rich plaque 06/10/2015  . Afib ([place])   . Elevated uric  acid in blood   . Arthritis of ankle   . Pulmonary HTN ([place])     Past Medical History:  Diagnosis Date  . Afib ([place])   . Arthritis of ankle   . CAD (coronary artery disease)   . Cancer [facility]) 2012   breast cancer-right  . Compression fracture of T12 vertebra (HCC)   . Diverticulosis   . Edema   . Elevated uric acid in blood   . GI bleed 12/2015  . Hypertension   . Long-term (current) use of anticoagulants   . Moderate mitral regurgitation   . Pulmonary HTN ([place])   . Stroke ([place]) 01/25/2016  . TIA (transient ischemic attack) 09/2011  . Urinary incontinence     Past Surgical History:  Procedure Laterality Date  . APPENDECTOMY    . BREAST SURGERY  2012   Rt.mastectomy--[place], [place]  2013  . CATARACT EXTRACTION, BILATERAL    . DILATION AND CURETTAGE OF UTERUS     in her early 60's for DUB  . ESOPHAGOGASTRODUODENOSCOPY Left 01/08/2016   Procedure: ESOPHAGOGASTRODUODENOSCOPY (EGD);  Surgeon: Arta Silence, MD;  Location: [facility];  Service: Endoscopy;  Laterality: Left;  . GALLBLADDER SURGERY    . MASTECTOMY Right   . RADIOLOGY WITH ANESTHESIA N/A 01/25/2016   Procedure: RADIOLOGY WITH ANESTHESIA;  Surgeon: Luanne Bras, MD;  Location: [facility];  Service: Radiology;  Laterality: N/A;  .  REPLACEMENT TOTAL KNEE BILATERAL    . TONSILLECTOMY AND ADENOIDECTOMY     as a child    Current Outpatient Prescriptions  Medication Sig Dispense Refill  . acetaminophen (TYLENOL) 500 MG tablet Take 500-1,000 mg by mouth every 12 (twelve) hours as needed (pain).     Marland Kitchen apixaban (ELIQUIS) 5 MG TABS tablet Take 1 tablet (5 mg total) by mouth 2 (two) times daily. 60 tablet 1  . CALCIUM PO Take 1 tablet by mouth 2 (two) times daily.     Marland Kitchen CARAFATE 1 GM/10ML suspension Take 10 mLs by mouth 2 (two) times daily.    . Cephalexin (KEFLEX PO) Take 125 mg by mouth 1 day or 1 dose.    . Cholecalciferol (VITAMIN D PO) Take 1 tablet by mouth daily.     Marland Kitchen diltiazem  (CARDIZEM) 60 MG tablet Take 1 tablet (60 mg total) by mouth 3 (three) times daily. 270 tablet 3  . loperamide (IMODIUM) 2 MG capsule Take by mouth as needed for diarrhea or loose stools.    . metoprolol (LOPRESSOR) 100 MG tablet Take 1 tablet (100 mg total) by mouth 2 (two) times daily. 180 tablet 3  . Multiple Vitamins-Minerals (MULTIVITAMIN ADULT PO) Take by mouth.    Marland Kitchen omeprazole (PRILOSEC) 40 MG capsule Take 40 mg by mouth daily.     Marland Kitchen PROAIR HFA 108 (90 Base) MCG/ACT inhaler as needed.     . Red Yeast Rice Extract (RED YEAST RICE PO) Take by mouth daily.    . vitamin C (ASCORBIC ACID) 500 MG tablet Take 500 mg by mouth daily. Takes  2 dailu     No current facility-administered medications for this visit.      ALLERGIES: Amiodarone hcl [amiodarone]; Compazine [prochlorperazine]; Other; Thorazine [chlorpromazine]; Valium [diazepam]; and Zometa [zoledronic acid]  Family History  Problem Relation Age of Onset  . Asthma Mother   . CVA Father   . Atrial fibrillation Sister     HAS PACER  . Pneumonia Brother   . Cancer Brother     BREAST CANCER  . Cancer Daughter     COLON CANCER    Social History   Social History  . Marital status: Married    Spouse name: N/A  . Number of children: N/A  . Years of education: N/A   Occupational History  . Not on file.   Social History Main Topics  . Smoking status: Former Smoker    Types: Cigarettes  . Smokeless tobacco: Never Used  . Alcohol use No  . Drug use: No  . Sexual activity: Yes    Partners: Male    Birth control/ protection: Post-menopausal   Other Topics Concern  . Not on file   Social History Narrative  . No narrative on file    ROS:  Pertinent items are noted in HPI.  PHYSICAL EXAMINATION:    BP 138/80 (BP Location: Left Arm, Patient Position: Sitting, Cuff Size: Normal)   Pulse 70   Ht 5\' 2"  (1.575 m)   Wt 147 lb (66.7 kg)   LMP 09/12/1973 (Approximate) Comment: spotting  BMI 26.89 kg/m     General  appearance: alert, cooperative and appears stated age    Pelvic: External genitalia:   1 - 2 mm blood blister at hymen 7:00.  Not bleeding.               Urethra:  normal appearing urethra with no masses, tenderness or lesions  Bartholins and Skenes: normal                 Vagina: normal appearing vagina with normal color and discharge, no lesions              Cervix: no lesions.  Bleeds slight with contact with large Qtip.                 Bimanual Exam:  Uterus:  normal size, contour, position, consistency, mobility, non-tender              Adnexa: no mass, fullness, tenderness     EMB -  Consent for procedure.  We have had a long discussion regarding the risks and benefits of stopping the Eliquis prior to the procedure today.  Speculum placed.  Hibiclens prep.  Small pipelle placed to 8 cm twice and tissue remove.  Tissue to [facility] lab.  Bimanual exam after procedure. Uterus small and nontender.  No adnexal masses or tenderness. Essentially no EBL.  No complications.   Chaperone was present for exam.  ASSESSMENT  Recurrent postmenopausal bleeding.  I suspect this is really from atrophy of the vagina and use of a pessary in the presence of Eliquis.  Thin endometrium.   PLAN  Follow up biopsy.  Tissue to [facility].  Instructions and precautions for patient.  Call if bleeding with pad change every 1 - 2 hours, fever, or significant pain.  Follow up in about 2 months for a recheck.    An After Visit Summary was printed and given to the patient.  __15____ minutes face to face time of which over 50% was spent in counseling.

## 2016-06-10 ENCOUNTER — Telehealth: Payer: Self-pay

## 2016-06-10 NOTE — Telephone Encounter (Signed)
Spoke with patient. Advised of results as seen below from Dr.Silva. Patient is agreeable and verbalizes understanding.  Routing to provider for final review. Patient agreeable to disposition. Will close encounter.     

## 2016-06-10 NOTE — Telephone Encounter (Signed)
-----   Message from Nunzio Cobbs, MD sent at 06/10/2016 12:41 PM EDT ----- Please inform patient that her endometrial biopsy is negative and normal.  It shows atrophy from her being postmenopausal.  This is expected.  There is no sign of polyp, hyperplasia, or malignancy.

## 2016-06-10 NOTE — Telephone Encounter (Signed)
Called patient at [phone] to discuss pathology report, left message to call me back.

## 2016-06-14 ENCOUNTER — Telehealth: Payer: Self-pay | Admitting: Cardiology

## 2016-06-14 MED ORDER — DILTIAZEM HCL 60 MG PO TABS: 60.0000 mg | ORAL_TABLET | Freq: Three times a day (TID) | ORAL | 2 refills | Status: DC

## 2016-06-14 MED ORDER — APIXABAN 5 MG PO TABS: 5.0000 mg | ORAL_TABLET | Freq: Two times a day (BID) | ORAL | 2 refills | Status: DC

## 2016-06-14 MED ORDER — METOPROLOL TARTRATE 100 MG PO TABS: 100.0000 mg | ORAL_TABLET | Freq: Two times a day (BID) | ORAL | 2 refills | Status: DC

## 2016-06-14 NOTE — Telephone Encounter (Signed)
New message    *STAT* If patient is at the pharmacy, call can be transferred to refill team.   1. Which medications need to be refilled? (please list name of each medication and dose if known) metoprolol 100 mg 2 times a day, diltiazem 60 mg 3 times a day, eliquis 5 mg 2 times a day   2. Which pharmacy/location (including street and city if local pharmacy) is medication to be sent to? [facility]  3. Do they need a 30 day or 90 day supply? [place]

## 2016-06-30 ENCOUNTER — Other Ambulatory Visit: Payer: Self-pay | Admitting: *Deleted

## 2016-06-30 MED ORDER — METOPROLOL TARTRATE 100 MG PO TABS: 100.0000 mg | ORAL_TABLET | Freq: Two times a day (BID) | ORAL | 2 refills | Status: DC

## 2016-06-30 MED ORDER — DILTIAZEM HCL 60 MG PO TABS: 60.0000 mg | ORAL_TABLET | Freq: Three times a day (TID) | ORAL | 2 refills | Status: DC

## 2016-07-08 DIAGNOSIS — R609 Edema, unspecified: Secondary | ICD-10-CM | POA: Diagnosis not present

## 2016-07-08 DIAGNOSIS — R635 Abnormal weight gain: Secondary | ICD-10-CM | POA: Diagnosis not present

## 2016-07-08 DIAGNOSIS — R0600 Dyspnea, unspecified: Secondary | ICD-10-CM | POA: Diagnosis not present

## 2016-07-08 DIAGNOSIS — I4891 Unspecified atrial fibrillation: Secondary | ICD-10-CM | POA: Diagnosis not present

## 2016-07-13 DIAGNOSIS — Z79899 Other long term (current) drug therapy: Secondary | ICD-10-CM | POA: Diagnosis not present

## 2016-07-13 DIAGNOSIS — R7989 Other specified abnormal findings of blood chemistry: Secondary | ICD-10-CM | POA: Diagnosis not present

## 2016-07-19 ENCOUNTER — Encounter (HOSPITAL_COMMUNITY): Payer: Self-pay | Admitting: Emergency Medicine

## 2016-07-19 ENCOUNTER — Emergency Department (HOSPITAL_COMMUNITY)
Admission: EM | Admit: 2016-07-19 | Discharge: 2016-07-19 | Disposition: A | Discharge status: Home / Self Care | Payer: Medicare Other | Attending: Emergency Medicine | Admitting: Emergency Medicine

## 2016-07-19 ENCOUNTER — Emergency Department (HOSPITAL_COMMUNITY): Payer: Medicare Other

## 2016-07-19 DIAGNOSIS — Z79899 Other long term (current) drug therapy: Secondary | ICD-10-CM | POA: Insufficient documentation

## 2016-07-19 DIAGNOSIS — Z87891 Personal history of nicotine dependence: Secondary | ICD-10-CM | POA: Insufficient documentation

## 2016-07-19 DIAGNOSIS — J40 Bronchitis, not specified as acute or chronic: Secondary | ICD-10-CM | POA: Diagnosis not present

## 2016-07-19 DIAGNOSIS — R0602 Shortness of breath: Secondary | ICD-10-CM | POA: Diagnosis not present

## 2016-07-19 DIAGNOSIS — Z853 Personal history of malignant neoplasm of breast: Secondary | ICD-10-CM | POA: Diagnosis not present

## 2016-07-19 DIAGNOSIS — Z7901 Long term (current) use of anticoagulants: Secondary | ICD-10-CM | POA: Insufficient documentation

## 2016-07-19 DIAGNOSIS — I2510 Atherosclerotic heart disease of native coronary artery without angina pectoris: Secondary | ICD-10-CM | POA: Insufficient documentation

## 2016-07-19 DIAGNOSIS — Z96653 Presence of artificial knee joint, bilateral: Secondary | ICD-10-CM | POA: Insufficient documentation

## 2016-07-19 DIAGNOSIS — Z8673 Personal history of transient ischemic attack (TIA), and cerebral infarction without residual deficits: Secondary | ICD-10-CM | POA: Insufficient documentation

## 2016-07-19 LAB — CBC
HEMATOCRIT: 40.8 % (ref 36.0–46.0)
Hemoglobin: 13.6 g/dL (ref 12.0–15.0)
MCH: 29.6 pg (ref 26.0–34.0)
MCHC: 33.3 g/dL (ref 30.0–36.0)
MCV: 88.7 fL (ref 78.0–100.0)
PLATELETS: 276 10*3/uL (ref 150–400)
RBC: 4.6 MIL/uL (ref 3.87–5.11)
RDW: 15 % (ref 11.5–15.5)
WBC: 13.1 10*3/uL — AB (ref 4.0–10.5)

## 2016-07-19 LAB — BASIC METABOLIC PANEL
Anion gap: 10 (ref 5–15)
BUN: 18 mg/dL (ref 6–20)
CHLORIDE: 102 mmol/L (ref 101–111)
CO2: 24 mmol/L (ref 22–32)
CREATININE: 0.96 mg/dL (ref 0.44–1.00)
Calcium: 9.9 mg/dL (ref 8.9–10.3)
GFR calc Af Amer: >60 mL/min (ref >60)
GFR calc non Af Amer: 52 mL/min — ABNORMAL LOW (ref >60)
GLUCOSE: 94 mg/dL (ref 65–99)
POTASSIUM: 4.1 mmol/L (ref 3.5–5.1)
SODIUM: 136 mmol/L (ref 135–145)

## 2016-07-19 LAB — I-STAT TROPONIN, ED: Troponin i, poc: 0 ng/mL (ref 0.00–0.08)

## 2016-07-19 LAB — BRAIN NATRIURETIC PEPTIDE: B Natriuretic Peptide: 258.2 pg/mL — ABNORMAL HIGH (ref 0.0–100.0)

## 2016-07-19 MED ORDER — AZITHROMYCIN 250 MG PO TABS
500.0000 mg | ORAL_TABLET | Freq: Once | ORAL | Status: DC
  Filled 2016-07-19: qty 2

## 2016-07-19 MED ORDER — IPRATROPIUM-ALBUTEROL 0.5-2.5 (3) MG/3ML IN SOLN
RESPIRATORY_TRACT | Status: AC
  Filled 2016-07-19: qty 3

## 2016-07-19 MED ORDER — IPRATROPIUM-ALBUTEROL 0.5-2.5 (3) MG/3ML IN SOLN
3.0000 mL | Freq: Once | RESPIRATORY_TRACT | Status: AC
  Administered 2016-07-19: 3 mL via RESPIRATORY_TRACT

## 2016-07-19 MED ORDER — AEROCHAMBER PLUS FLO-VU MEDIUM MISC
1.0000 | Freq: Once | Status: AC
  Administered 2016-07-19: 1
  Filled 2016-07-19 (×2): qty 1

## 2016-07-19 MED ORDER — DOXYCYCLINE HYCLATE 100 MG PO TABS
100.0000 mg | ORAL_TABLET | Freq: Once | ORAL | Status: AC
  Administered 2016-07-19: 100 mg via ORAL
  Filled 2016-07-19: qty 1

## 2016-07-19 MED ORDER — PREDNISONE 10 MG PO TABS: 20.0000 mg | ORAL_TABLET | Freq: Every day | ORAL | 0 refills | Status: DC

## 2016-07-19 MED ORDER — PREDNISONE 20 MG PO TABS
60.0000 mg | ORAL_TABLET | Freq: Once | ORAL | Status: AC
  Administered 2016-07-19: 60 mg via ORAL
  Filled 2016-07-19: qty 3

## 2016-07-19 MED ORDER — ALBUTEROL SULFATE (2.5 MG/3ML) 0.083% IN NEBU
5.0000 mg | INHALATION_SOLUTION | Freq: Once | RESPIRATORY_TRACT | Status: AC
  Administered 2016-07-19: 5 mg via RESPIRATORY_TRACT
  Filled 2016-07-19: qty 6

## 2016-07-19 MED ORDER — AEROCHAMBER PLUS W/MASK MISC
1.0000 | Freq: Once | Status: DC
  Filled 2016-07-19: qty 1

## 2016-07-19 MED ORDER — ALBUTEROL SULFATE HFA 108 (90 BASE) MCG/ACT IN AERS
2.0000 | INHALATION_SPRAY | RESPIRATORY_TRACT | Status: DC | PRN
Start: 2016-07-19 — End: 2016-07-20
  Administered 2016-07-19: 2 via RESPIRATORY_TRACT
  Filled 2016-07-19: qty 6.7

## 2016-07-19 MED ORDER — DOXYCYCLINE HYCLATE 100 MG PO CAPS: 100.0000 mg | ORAL_CAPSULE | Freq: Two times a day (BID) | ORAL | 0 refills | Status: DC

## 2016-07-19 MED ORDER — DEXTROSE 5 % IV SOLN
1.0000 g | Freq: Once | INTRAVENOUS | Status: DC
  Filled 2016-07-19: qty 10

## 2016-07-19 NOTE — ED Notes (Signed)
Called pharmacy to request aerochamber to provide to patient prior to discharge.

## 2016-07-19 NOTE — ED Notes (Signed)
Patient brought back to room; patient getting undressed and into a gown at this time; visitor at bedside

## 2016-07-19 NOTE — Discharge Instructions (Signed)
Take all medicine as prescribed.  Return if you are worse at any time.Recheck with your doctor this week.

## 2016-07-19 NOTE — ED Notes (Signed)
Patient requested to take home medication (Eliquis) that she typically takes at 1900. Asked Dr. Jeanell Sparrow for approval. Dr. Jeanell Sparrow stated patient may take Eliquis from home.

## 2016-07-19 NOTE — ED Provider Notes (Signed)
[place] [facility] DEPT Provider Note   CSN: [id number] Arrival date & time: 07/19/16  1516     History   Chief Complaint Chief Complaint  Patient presents with  . Shortness of Breath    HPI Adrienne [name] is a 82 y.o. female.  HPI  This is an 82 year old female who presents today stating she has had some coughing and wheezing for the past 3 days. Her husband has said similar symptoms the past week. She states that she felt like she began getting sick 3 days ago and has had some increased coughing over the past 24 hours. Cough has been productive of some yellowish sputum. She also feels that she is wheezing. She does not have any previous history of pneumonia, bronchitis, COPD, or asthma. She is slightly dyspneic. She was seen by her primary care physician today and told to come to the ED for further evaluation. She has been on Lasix and feels that she is somewhat dehydrated. Denies chest pain, headache, vision changes, vomiting, or diarrhea. She states that she has had some decreased appetite  Past Medical History:  Diagnosis Date  . Afib ([place])   . Arthritis of ankle   . CAD (coronary artery disease)   . Cancer [facility]) 2012   breast cancer-right  . Compression fracture of T12 vertebra (HCC)   . Diverticulosis   . Edema   . Elevated uric acid in blood   . GI bleed 12/2015  . Hypertension   . Long-term (current) use of anticoagulants   . Moderate mitral regurgitation   . Pulmonary HTN   . Stroke ([place]) 01/25/2016  . TIA (transient ischemic attack) 09/2011  . Urinary incontinence     Patient Active Problem List   Diagnosis Date Noted  . Diplopia 01/29/2016  . GERD (gastroesophageal reflux disease) 01/29/2016  . Cerebrovascular accident (CVA) due to embolism of left middle cerebral artery ([place]) 01/29/2016  . Persistent atrial fibrillation ([place])   . Bleeding gastrointestinal   . Gait disturbance, post-stroke 01/27/2016  . Fall   . Secondary hypertension, unspecified   .  Cerebral infarction due to thrombosis of left middle cerebral artery (HCC) s/p mechanical thrombectomy   . Malnutrition of moderate degree 01/09/2016  . GI bleed 01/06/2016  . Chronic atrial fibrillation ([place]) 06/10/2015  . Pulmonary hypertension, secondary ([place]) 06/10/2015  . Chronic anticoagulation 06/10/2015  . HLD (hyperlipidemia) 06/10/2015  . Essential hypertension 06/10/2015  . History of stroke 06/10/2015  . Coronary artery disease due to lipid rich plaque 06/10/2015  . Afib ([place])   . Elevated uric acid in blood   . Arthritis of ankle   . Pulmonary HTN     Past Surgical History:  Procedure Laterality Date  . APPENDECTOMY    . BREAST SURGERY  2012   Rt.mastectomy--[place], [place]  2013  . CATARACT EXTRACTION, BILATERAL    . DILATION AND CURETTAGE OF UTERUS     in her early 1's for DUB  . ESOPHAGOGASTRODUODENOSCOPY Left 01/08/2016   Procedure: ESOPHAGOGASTRODUODENOSCOPY (EGD);  Surgeon: Arta Silence, MD;  Location: [facility];  Service: Endoscopy;  Laterality: Left;  . GALLBLADDER SURGERY    . MASTECTOMY Right   . RADIOLOGY WITH ANESTHESIA N/A 01/25/2016   Procedure: RADIOLOGY WITH ANESTHESIA;  Surgeon: Luanne Bras, MD;  Location: [facility];  Service: Radiology;  Laterality: N/A;  . REPLACEMENT TOTAL KNEE BILATERAL    . TONSILLECTOMY AND ADENOIDECTOMY     as a child    OB History  Gravida Para Term Preterm AB Living   3 3 3     3    SAB TAB Ectopic Multiple Live Births                   Home Medications    Prior to Admission medications   Medication Sig Start Date End Date Taking? Authorizing Provider  acetaminophen (TYLENOL) 500 MG tablet Take 500-1,000 mg by mouth every 12 (twelve) hours as needed (pain).    Yes Historical Provider, MD  apixaban (ELIQUIS) 5 MG TABS tablet Take 1 tablet (5 mg total) by mouth 2 (two) times daily. 06/14/16  Yes Jerline Pain, MD  CALCIUM-VITAMIN D PO Take 1 tablet by mouth 2 (two) times daily.    Yes Historical Provider, MD  CARAFATE 1 GM/10ML suspension Take 10 mLs by mouth 2 (two) times daily as needed (for mouth).  03/07/16  Yes Historical Provider, MD  cephALEXin (KEFLEX) 250 MG capsule Take 250 mg by mouth daily. 07/03/16  Yes Historical Provider, MD  cholecalciferol (VITAMIN D) 1000 units tablet Take 1,000 Units by mouth daily.   Yes Historical Provider, MD  dextromethorphan (DELSYM) 30 MG/5ML liquid Take 60 mg by mouth 2 (two) times daily as needed for cough.   Yes Historical Provider, MD  diltiazem (CARDIZEM) 60 MG tablet Take 1 tablet (60 mg total) by mouth 3 (three) times daily. 06/30/16  Yes Jerline Pain, MD  furosemide (LASIX) 20 MG tablet Take 20 mg by mouth daily.   Yes Historical Provider, MD  loperamide (IMODIUM) 2 MG capsule Take by mouth as needed for diarrhea or loose stools.   Yes Historical Provider, MD  metoprolol (LOPRESSOR) 100 MG tablet Take 1 tablet (100 mg total) by mouth 2 (two) times daily. 06/30/16  Yes Jerline Pain, MD  Multiple Vitamins-Minerals (MULTIVITAMIN ADULT PO) Take 1 tablet by mouth daily.    Yes Historical Provider, MD  omeprazole (PRILOSEC) 40 MG capsule Take 40 mg by mouth daily.    Yes Historical Provider, MD  PROAIR HFA 108 [phone] Base) MCG/ACT inhaler as needed.  05/05/16  Yes Historical Provider, MD  Throat Lozenges (COUGH DROPS MT) Use as directed 1 lozenge in the mouth or throat every 4 (four) hours as needed (cough).   Yes Historical Provider, MD  vitamin C (ASCORBIC ACID) 500 MG tablet Take 1,000 mg by mouth daily. Takes 2 tabs daily   Yes Historical Provider, MD    Family History Family History  Problem Relation Age of Onset  . Asthma Mother   . CVA Father   . Atrial fibrillation Sister     HAS PACER  . Pneumonia Brother   . Cancer Brother     BREAST CANCER  . Cancer Daughter     COLON CANCER    Social History Social History  Substance Use Topics  . Smoking status: Former Smoker    Types: Cigarettes  . Smokeless tobacco:  Never Used  . Alcohol use No     Allergies   Valium [diazepam]; Amiodarone hcl [amiodarone]; Compazine [prochlorperazine]; Other; Thorazine [chlorpromazine]; and Zometa [zoledronic acid]   Review of Systems Review of Systems  All other systems reviewed and are negative.    Physical Exam Updated Vital Signs BP 167/88   Pulse 92   Temp 98 F (36.7 C) (Oral)   Resp 14   LMP 09/12/1973 (Approximate) Comment: spotting  SpO2 100%   Physical Exam  Constitutional: She is oriented to person, place, and time.  HENT:  Head: Normocephalic and atraumatic.  Right Ear: External ear normal.  Left Ear: External ear normal.  Nose: Nose normal.  Mouth/Throat: Oropharynx is clear and moist.  Lips appear slightly dry  Eyes: Conjunctivae and EOM are normal. Pupils are equal, round, and reactive to light.  Neck: Normal range of motion. Neck supple.  Cardiovascular:  Irregularly irregular  Pulmonary/Chest: Effort normal.  Diffuse expiratory wheezes greater at left base Right-sided mastectomy  Abdominal: Soft. Bowel sounds are normal.  Musculoskeletal: Normal range of motion. She exhibits no edema.  Neurological: She is alert and oriented to person, place, and time.  Skin: Skin is warm. Capillary refill takes less than 2 seconds.  Psychiatric: She has a normal mood and affect.  Nursing note and vitals reviewed.    ED Treatments / Results  Labs (all labs ordered are listed, but only abnormal results are displayed) Labs Reviewed  BASIC METABOLIC PANEL - Abnormal; Notable for the following:       Result Value   GFR calc non Af Amer 52 (*)    All other components within normal limits  CBC - Abnormal; Notable for the following:    WBC 13.1 (*)    All other components within normal limits  BRAIN NATRIURETIC PEPTIDE - Abnormal; Notable for the following:    B Natriuretic Peptide 258.2 (*)    All other components within normal limits  I-STAT TROPOININ, ED    EKG  EKG  Interpretation  Date/Time:  Tuesday July 19 2016 15:25:59 EST Ventricular Rate:  92 PR Interval:    QRS Duration: 90 QT Interval:  358 QTC Calculation: 442 R Axis:   68 Text Interpretation:  Atrial fibrillation ST & T wave abnormality, consider inferior ischemia Abnormal ECG No significant change since last tracing Confirmed by Kerryann Allaire MD, Andee Poles [id number]) on 07/19/2016 5:45:37 PM       Radiology No results found.  Procedures Procedures (including critical care time)  Medications Ordered in ED Medications  ipratropium-albuterol (DUONEB) 0.5-2.5 (3) MG/3ML nebulizer solution (  Not Given 07/19/16 1529)  ipratropium-albuterol (DUONEB) 0.5-2.5 (3) MG/3ML nebulizer solution 3 mL (3 mLs Nebulization Given 07/19/16 1529)  albuterol (PROVENTIL) (2.5 MG/3ML) 0.083% nebulizer solution 5 mg (5 mg Nebulization Given 07/19/16 1801)  predniSONE (DELTASONE) tablet 60 mg (60 mg Oral Given 07/19/16 1801)  doxycycline (VIBRA-TABS) tablet 100 mg (100 mg Oral Given 07/19/16 1906)  albuterol (PROVENTIL) (2.5 MG/3ML) 0.083% nebulizer solution 5 mg (5 mg Nebulization Given 07/19/16 1906)     Initial Impression / Assessment and Plan / ED Course  I have reviewed the triage vital signs and the nursing notes.  Pertinent labs & imaging results that were available during my care of the patient were reviewed by me and considered in my medical decision making (see chart for details).  Clinical Course    Patient received albuterol 3 here, prednisone, and doxycycline. She has had complete resolution of wheezing. Chest x-Debra Colon was obtained at [facility] and reviewed with radiologist here. I am unable to visualize the chest x-Adriauna Campton but I did discuss with the radiologist who states that there is some left lower lobe streaking that has not changed from October 27 but was new from several months ago. He states that this is mild and he is not convinced that there is any pneumonia there. 1-bronchitis/? Infiltrate-patient. Placed  on doxycycline, given albuterol HFA, and prednisone. We have discussed that she should return if there is a fever as she has any worsening dyspnea 2 A. fib this is  chronic in nature. Patient states that she thought her doctor saw something abnormal on her EKG today. I have reviewed her EKG and her parents are nonspecific ST changes that were present previously but appear somewhat more pronounced today. The patient is not having any chest pain. On arrival here her recent 64s. Has gone up with the albuterol. A troponin was checked and is normal. She is on eliquis. She gives no history is consistent with ischemia.  Final Clinical Impressions(s) / ED Diagnoses   Final diagnoses:  Bronchitis    New Prescriptions New Prescriptions   No medications on file     Pattricia Boss, MD 07/19/16 2050

## 2016-07-19 NOTE — ED Notes (Signed)
Dr.Ray at bedside at this time.  

## 2016-07-19 NOTE — ED Notes (Addendum)
Patient has hx of bronchitis. Patient has productive cough with greenish, yellow sputum. Chest tightness improved after breathing tx in triage. No hx of MIs. Hx of afib and CVA and CHF. On eliquis. 96% on RA. Bil LE swollen.

## 2016-07-19 NOTE — ED Triage Notes (Signed)
Pt sts increased SOB and cough with wheezing and chest tightness x 3 days; pts husband has been sick with similar; pt sent for further eval

## 2016-08-01 DIAGNOSIS — J988 Other specified respiratory disorders: Secondary | ICD-10-CM | POA: Diagnosis not present

## 2016-08-01 DIAGNOSIS — R8290 Unspecified abnormal findings in urine: Secondary | ICD-10-CM | POA: Diagnosis not present

## 2016-08-08 ENCOUNTER — Telehealth: Payer: Self-pay | Admitting: Obstetrics and Gynecology

## 2016-08-08 ENCOUNTER — Encounter: Payer: Self-pay | Admitting: Obstetrics and Gynecology

## 2016-08-08 ENCOUNTER — Ambulatory Visit: Payer: Medicare Other | Admitting: Obstetrics and Gynecology

## 2016-08-08 DIAGNOSIS — J988 Other specified respiratory disorders: Secondary | ICD-10-CM | POA: Diagnosis not present

## 2016-08-08 DIAGNOSIS — D72828 Other elevated white blood cell count: Secondary | ICD-10-CM | POA: Diagnosis not present

## 2016-08-08 NOTE — Telephone Encounter (Signed)
Thanks for the update.  Encounter closed. 

## 2016-08-08 NOTE — Telephone Encounter (Signed)
Patient called and said, "I'm so sorry but I have to cancel my appointment for today with Dr. Quincy Simmonds. I'm sick with a chest cold and a very bad sore throat. I'll have to call back to reschedule when I'm feeling better."  FYI only.

## 2016-08-11 ENCOUNTER — Ambulatory Visit (INDEPENDENT_AMBULATORY_CARE_PROVIDER_SITE_OTHER): Payer: Medicare Other | Admitting: Neurology

## 2016-08-11 ENCOUNTER — Encounter: Payer: Self-pay | Admitting: Neurology

## 2016-08-11 VITALS — BP 120/60 | HR 78 | Wt 145.6 lb

## 2016-08-11 DIAGNOSIS — Z7901 Long term (current) use of anticoagulants: Secondary | ICD-10-CM | POA: Diagnosis not present

## 2016-08-11 DIAGNOSIS — I10 Essential (primary) hypertension: Secondary | ICD-10-CM

## 2016-08-11 DIAGNOSIS — I481 Persistent atrial fibrillation: Secondary | ICD-10-CM | POA: Diagnosis not present

## 2016-08-11 DIAGNOSIS — I63312 Cerebral infarction due to thrombosis of left middle cerebral artery: Secondary | ICD-10-CM | POA: Diagnosis not present

## 2016-08-11 DIAGNOSIS — E785 Hyperlipidemia, unspecified: Secondary | ICD-10-CM

## 2016-08-11 DIAGNOSIS — I639 Cerebral infarction, unspecified: Secondary | ICD-10-CM

## 2016-08-11 DIAGNOSIS — I4819 Other persistent atrial fibrillation: Secondary | ICD-10-CM

## 2016-08-11 NOTE — Patient Instructions (Addendum)
-   continue eliquis 5mg  twice a day for stroke prevention. - check BP at home and record.  - follow up with cardiology Dr. Marlou Porch as scheduled - Follow up with your primary care physician for stroke risk factor modification. Recommend maintain blood pressure goal <130/80, diabetes with hemoglobin A1c goal below 7.0% and lipids with LDL cholesterol goal below 70 mg/dL.  - may consider mild statin such as pravastatin or lovastatin if LDL still high. - healthy diet and regular exercise and avoid fall - follow up in 6 months.

## 2016-08-11 NOTE — Progress Notes (Addendum)
STROKE NEUROLOGY FOLLOW UP NOTE  NAME: Adrienne [name] DOB: Oct 23, 1927  REASON FOR VISIT: stroke follow up HISTORY FROM: pt and husband and chart  Today we had Adrienne pleasure of seeing Adrienne [name] in follow-up at our Neurology Clinic. Pt was accompanied by husband.   History Summary Adrienne [name] is a 82 y.o. female with history of atrial fibrillation on coumadin with GIB 3 weeks PTA was admitted on 01/25/16 for R sided weakness and receptive phasia following a fall. Adrienne [name] did not receive IV t-PA per documenation due to hx GIB. INR 1.5 on arrival. CTA showed L MCA bifurcation occlusion, left ICA siphon moderate plaque, and intracranial stenosis b/l ACA, R MCA, L PCA branches. CTP showed penumbra. Adrienne [name] was taken to IR with complete TICI 3 revascularization of L MCA with Solitaire and IA integrelin. MRI showed only a small volume of Adrienne left MCA territory infarct. EF 60-65% LDL 80 and A1C 5.6. Her coumadin switched to eliquis. H&H stable no GIB during admission. Adrienne [name] was discharged to CIR with eliquis and lipitor 20.   04/06/16 follow up - Adrienne [name] has been doing well. Adrienne [name] had full recovery and d/c from CIR to home. Adrienne [name] followed with cardiology and lipitor stopped due to mild side effects of myalgia. On cardizem and metoprolol of rate control. On eliquis without bleeding side effects. BP 130/80.     Interval History During Adrienne interval time, Adrienne pt has been doing well. No recurrent stroke like symptoms. BP at home well controlled, 125-145/50-65. Finished PT/OT. Has follow up with PCP next week and cardiology Dr Marlou Porch in 09/2015. Not on statin but red yeast. BP 120/60   REVIEW OF SYSTEMS: Full 14 system review of systems performed and notable only for those listed below and in HPI above, all others are negative:  Constitutional:   Cardiovascular:  Ear/Nose/Throat:   Skin:  Eyes:   Respiratory:   Gastroitestinal:   Genitourinary: Hematology/Lymphatic:   Endocrine:  Musculoskeletal:     Allergy/Immunology:   Neurological:   Psychiatric:  Sleep:   Adrienne following represents Adrienne [name]'s updated allergies and side effects list: Allergies  Allergen Reactions  . Valium [Diazepam] Other (See Comments)    HYPERACTIVITY   . Amiodarone Hcl [Amiodarone] Other (See Comments)    AFIB  . Compazine [Prochlorperazine] Other (See Comments)    HYPERACTIVITY   . Other Other (See Comments)    "Kidney dye"--[name] states this gave her severe shakes/chills  . Thorazine [Chlorpromazine] Other (See Comments)    HYPERACTIVITY   . Zometa [Zoledronic Acid] Other (See Comments)    PROBLEMS WITH EYES    Adrienne neurologically relevant items on Adrienne [name]'s problem list were reviewed on today's visit.  Neurologic Examination  A problem focused neurological exam (12 or more points of Adrienne single system neurologic examination, vital signs counts as 1 point, cranial nerves count for 8 points) was performed.  Blood pressure 120/60, pulse 78, weight 145 lb 9.6 oz (66 kg), last menstrual period 09/12/1973.  General - Well nourished, well developed, in no apparent distress.  Ophthalmologic - Sharp disc margins OU.  Cardiovascular - irregularly irregular heart rate and rhythm.  Mental Status -  Level of arousal and orientation to time, place, and person were intact. Language including expression, naming, repetition, comprehension was assessed and found intact. Fund of Knowledge was assessed and was intact.  Cranial Nerves II - XII - II - Visual field intact OU. III, IV, VI - Extraocular movements intact. V - Facial  sensation intact bilaterally. VII - Facial movement intact bilaterally. VIII - Hearing & vestibular intact bilaterally. X - Palate elevates symmetrically. XI - Chin turning & shoulder shrug intact bilaterally. XII - Tongue protrusion intact.  Motor Strength - Adrienne [name]'s strength was normal in all extremities and pronator drift was absent.  Bulk was normal and  fasciculations were absent.   Motor Tone - Muscle tone was assessed at Adrienne neck and appendages and was normal.  Reflexes - Adrienne [name]'s reflexes were 1+ in all extremities and Adrienne [name] had no pathological reflexes.  Sensory - Light touch, temperature/pinprick were assessed and were normal.    Coordination - Adrienne [name] had normal movements in Adrienne hands and feet with no ataxia or dysmetria.  Tremor was absent.  Gait and Station - Adrienne [name]'s transfers, posture, gait, station, and turns were observed as normal.   Data reviewed: I personally reviewed Adrienne images and agree with Adrienne radiology interpretations.  Ct Head Wo Contrast 01/25/2016 Early changes of acute posterior left MCA infarct. No parenchymal hematoma. 01/25/2016 Atrophy with small vessel disease in Adrienne periventricular white matter. Prior infarcts as noted above. No acute appearing infarct is evident. No hemorrhage or mass effect. ADDENDUM Left hemisphere ASPECTS score = 9; small area of cortical hypodensity in Adrienne left M2 as seen on series 21, image 14.had given verbal preliminary ASPECTS score of "10"   Ct Angio Head & Neck W/cm &/or Wo/cm 01/25/2016 1. Positive for Emergent Large Vessel Occlusion at Adrienne left MCA bifurcation. Abnormal left MCA perfusion with noncontrast head CT and CT Brain Perfusion parameters favorable for clot retrieval. 2. Superimposed atherosclerotic stenosis at Adrienne left ICA origin (60-65%) and left supraclinoid ICA siphon (moderate) related to calcified plaque. 3. Superimposed mild to moderate second and third order branch intracranial atherosclerosis including in Adrienne bilateral ACA, right MCA, and left PCA branches. 4. Right carotid bifurcation calcified plaque without stenosis.   Ct C-spine No Charge 01/25/2016 Multilevel degenerative change without acute abnormality.   Ct Cerebral Perfusion W/cm 01/25/2016 1. Positive for Emergent Large Vessel Occlusion at Adrienne left MCA bifurcation. Abnormal left MCA  perfusion with noncontrast head CT and CT Brain Perfusion parameters favorable for clot retrieval.   Cerebral angiogram 01/25/2016 bilateral common carotid arteriograms followed by complete revascularization of Occluded Lt MCA With x 1 pass with Solitaire 4 mmx 40 mm Retrieval device and 4 mg of superselective IA integrelin with TICI 3 revascularization.  MRI Brain Wo Contrast 01/27/2016 Only a small volume of left MCA territory infarction occurred, primarily along Adrienne left sylvian fissure. No hemorrhage or mass effect. 2. Occasional other punctate areas of restricted diffusion over both superior convexities, probably neuro-intervention related.  CXR 01/26/2016 No active disease. Probable chronic mild interstitial prominence. Endotracheal and NG tube in place. No pneumothorax.  2D echocardiogram - Left ventricle: Adrienne cavity size was normal. Systolic function was  normal. Adrienne estimated ejection fraction was in Adrienne range of 60% to 65%. Wall motion was normal; there were no regional wall motion abnormalities. Adrienne study is not technically sufficient to allow evaluation of LV diastolic function. - Aortic valve: Valve mobility was mildly restricted. Transvalvular velocity was within Adrienne normal range. There was no stenosis. There was trivial regurgitation. - Mitral valve: Calcified annulus. Transvalvular velocity was within Adrienne normal range. There was no evidence for stenosis. There was trivial regurgitation. - Left atrium: Adrienne atrium was moderately dilated. - Right ventricle: Adrienne cavity size was normal. Wall thickness was normal. Systolic function was normal. -  Atrial septum: No defect or patent foramen ovale was identified by color flow Doppler. - Tricuspid valve: There was mild regurgitation. - Pulmonary arteries: Systolic pressure was within Adrienne normal range. PA peak pressure: 29 mm Hg (S).  Component     Latest Ref Rng & Units 01/26/2016  Cholesterol     0 - 200 mg/dL 143  Triglycerides      <150 mg/dL 101  HDL Cholesterol     >40 mg/dL 43  Total CHOL/HDL Ratio     RATIO 3.3  VLDL     0 - 40 mg/dL 20  LDL (calc)     0 - 99 mg/dL 80  Hemoglobin A1C     4.8 - 5.6 % 5.6  Mean Plasma Glucose     mg/dL 114    Assessment: As you may recall, Adrienne [name] is a 82 y.o. Caucasian female with PMH of  atrial fibrillation on coumadin with GIB 3 weeks PTA was admitted on 01/25/16 for R sided weakness and receptive phasia following a fall. Adrienne [name] did not receive IV t-PA per documenation due to hx GIB. INR 1.5 on arrival. CTA showed L MCA bifurcation occlusion, left ICA siphon moderate plaque, and intracranial stenosis b/l ACA, R MCA, L PCA branches. CTP showed penumbra. Adrienne [name] was taken to IR with complete TICI 3 revascularization of L MCA with Solitaire and IA integrelin. MRI showed only a small volume of Adrienne left MCA territory infarct. EF 60-65% LDL 80 and A1C 5.6. Her coumadin switched to eliquis. H&H stable no GIB. Adrienne [name] was discharged to CIR with eliquis and lipitor 20. Adrienne [name] had full recovery and d/c from CIR to home. Adrienne [name] followed with cardiology and lipitor stopped due to mild side effects of myalgia. On cardizem and metoprolol of rate control. On eliquis without bleeding side effects. BP well controlled.  Plan:  - continue eliquis 5mg  bid for stroke prevention. - check BP at home and record.  - follow up with cardiology Dr. Marlou Porch as scheduled - Follow up with your primary care physician for stroke risk factor modification. Recommend maintain blood pressure goal <130/80, diabetes with hemoglobin A1c goal below 7.0% and lipids with LDL cholesterol goal below 70 mg/dL.  - may consider mild statin such as pravastatin or lovastatin if LDL still high on repeat. - healthy diet and regular exercise and avoid fall - follow up in 6 months.  I spent more than 25 minutes of face to face time with Adrienne [name]. Greater than 50% of time was spent in counseling and coordination of care. We discussed bleeding  precautions, LDL monitoring, and statin use.  No orders of Adrienne defined types were placed in this encounter.   Meds ordered this encounter  Medications  . amoxicillin-clavulanate (AUGMENTIN) 875-125 MG tablet  . fluticasone (FLONASE) 50 MCG/ACT nasal spray  . nystatin (MYCOSTATIN) 100000 UNIT/ML suspension  . lidocaine (XYLOCAINE) 2 % solution    [name] Instructions  - continue eliquis 5mg  twice a day for stroke prevention. - check BP at home and record.  - follow up with cardiology Dr. Marlou Porch as scheduled - Follow up with your primary care physician for stroke risk factor modification. Recommend maintain blood pressure goal <130/80, diabetes with hemoglobin A1c goal below 7.0% and lipids with LDL cholesterol goal below 70 mg/dL.  - may consider mild statin such as pravastatin or lovastatin if LDL still high. - healthy diet and regular exercise and avoid fall - follow up in 6 months.   Rosalin Hawking, MD PhD  [facility] [address] [place], [place] [address] [phone]

## 2016-08-16 DIAGNOSIS — Z1231 Encounter for screening mammogram for malignant neoplasm of breast: Secondary | ICD-10-CM | POA: Diagnosis not present

## 2016-08-16 DIAGNOSIS — N6012 Diffuse cystic mastopathy of left breast: Secondary | ICD-10-CM | POA: Diagnosis not present

## 2016-08-16 DIAGNOSIS — R922 Inconclusive mammogram: Secondary | ICD-10-CM | POA: Diagnosis not present

## 2016-08-16 DIAGNOSIS — Z853 Personal history of malignant neoplasm of breast: Secondary | ICD-10-CM | POA: Diagnosis not present

## 2016-08-16 DIAGNOSIS — Z9011 Acquired absence of right breast and nipple: Secondary | ICD-10-CM | POA: Diagnosis not present

## 2016-08-17 DIAGNOSIS — L821 Other seborrheic keratosis: Secondary | ICD-10-CM | POA: Diagnosis not present

## 2016-08-23 DIAGNOSIS — I4891 Unspecified atrial fibrillation: Secondary | ICD-10-CM | POA: Diagnosis not present

## 2016-08-23 DIAGNOSIS — Z8673 Personal history of transient ischemic attack (TIA), and cerebral infarction without residual deficits: Secondary | ICD-10-CM | POA: Diagnosis not present

## 2016-08-23 DIAGNOSIS — Z7901 Long term (current) use of anticoagulants: Secondary | ICD-10-CM | POA: Diagnosis not present

## 2016-08-23 DIAGNOSIS — K219 Gastro-esophageal reflux disease without esophagitis: Secondary | ICD-10-CM | POA: Diagnosis not present

## 2016-08-23 DIAGNOSIS — I2510 Atherosclerotic heart disease of native coronary artery without angina pectoris: Secondary | ICD-10-CM | POA: Diagnosis not present

## 2016-08-23 DIAGNOSIS — Z87440 Personal history of urinary (tract) infections: Secondary | ICD-10-CM | POA: Diagnosis not present

## 2016-08-23 DIAGNOSIS — Z853 Personal history of malignant neoplasm of breast: Secondary | ICD-10-CM | POA: Diagnosis not present

## 2016-08-23 DIAGNOSIS — Z79899 Other long term (current) drug therapy: Secondary | ICD-10-CM | POA: Diagnosis not present

## 2016-08-23 DIAGNOSIS — Z23 Encounter for immunization: Secondary | ICD-10-CM | POA: Diagnosis not present

## 2016-09-15 DIAGNOSIS — Z8673 Personal history of transient ischemic attack (TIA), and cerebral infarction without residual deficits: Secondary | ICD-10-CM | POA: Diagnosis not present

## 2016-09-15 DIAGNOSIS — M4850XD Collapsed vertebra, not elsewhere classified, site unspecified, subsequent encounter for fracture with routine healing: Secondary | ICD-10-CM | POA: Diagnosis not present

## 2016-09-15 DIAGNOSIS — M810 Age-related osteoporosis without current pathological fracture: Secondary | ICD-10-CM | POA: Diagnosis not present

## 2016-09-15 DIAGNOSIS — R6889 Other general symptoms and signs: Secondary | ICD-10-CM | POA: Diagnosis not present

## 2016-09-19 ENCOUNTER — Ambulatory Visit: Payer: Medicare Other | Admitting: Cardiology

## 2016-09-19 NOTE — Progress Notes (Deleted)
Cardiology Office Note   Date:  09/19/2016   ID:  Anabrenda Felmlee, DOB 1928-03-16, MRN [id number]  PCP:  Tawanna Solo, MD  Cardiologist:   Candee Furbish, MD       History of Present Illness: Adrienne [name] is a 83 y.o. female (I see her husband, she is new from [place]) here for follow up of atrial fibrillation, pulmonary hypertension, coronary artery disease, mitral regurgitation on chronic anticoagulation patient of Dr. Kathyrn Lass.  Admitted in April with intermittent bloody stools. Anticoagulation was placed on hold. GI work up unremarkable (Hiatal hernia and diverticulosis). On Jan 25, 2016 had right side weakness and was found in bathroom by husband. Coumadin was restarted about a week prior to this, INR 1.5.Left MCA infarct. Interventional radiology. Transitioned to Eliquis on May 18.   She called [place] on 03/08/16 wanting to know if she should continue Eliquis and Lipitor. Feels tired all of the time. Asked to come in sooner. BP 110/50. She wonders if the Lipitor is making her symptoms worse, she cannot eat, chronic nausea. Note, she was having some stomach pains prior to her use of atorvastatin.  Still feels poor, diarrhea (studies normal). Working on balance.   Atrial fibrillation: In the past she has tried amiodarone but now has this listed under allergies. Currently under rate control strategy. Taking diltiazem, metoprolol. Had minor stroke years ago on Zometa in 2013 breast cancer. Right breast.   Coronary artery disease: Detected on catheterization in February 2013, nonobstructive. Echo on 09/2014 showed moderate mitral regurgitation.  Hyperlipidemia-assume that she has had difficulty with statins in the past given her current use of red yeast rice.  She has had prior visits with Dr. Sabra Heck for dizziness. Vertigo-like symptoms. During that visit she was concerned about cardiology referral for management of A. fib and Coumadin.  Pulmonary hypertension: been steady for  years. In fact last echocardiogram reassuring with no significant elevation pressures.   She states that she has been having some fevers, chills, has seen Dr. Kathyrn Lass and is been currently being treated.       Past Medical History:  Diagnosis Date  . Afib ([place])   . Arthritis of ankle   . CAD (coronary artery disease)   . Cancer [facility]) 2012   breast cancer-right  . Compression fracture of T12 vertebra ([facility])   . Diverticulosis   . Edema   . Elevated uric acid in blood   . GI bleed 12/2015  . Hypertension   . Long-term (current) use of anticoagulants   . Moderate mitral regurgitation   . Pulmonary HTN   . Stroke ([place]) 01/25/2016  . TIA (transient ischemic attack) 09/2011  . Urinary incontinence     Past Surgical History:  Procedure Laterality Date  . APPENDECTOMY    . BREAST SURGERY  2012   Rt.mastectomy--[place], [place]  2013  . CATARACT EXTRACTION, BILATERAL    . DILATION AND CURETTAGE OF UTERUS     in her early 40's for DUB  . ESOPHAGOGASTRODUODENOSCOPY Left 01/08/2016   Procedure: ESOPHAGOGASTRODUODENOSCOPY (EGD);  Surgeon: Arta Silence, MD;  Location: [facility];  Service: Endoscopy;  Laterality: Left;  . GALLBLADDER SURGERY    . MASTECTOMY Right   . RADIOLOGY WITH ANESTHESIA N/A 01/25/2016   Procedure: RADIOLOGY WITH ANESTHESIA;  Surgeon: Luanne Bras, MD;  Location: [facility];  Service: Radiology;  Laterality: N/A;  . REPLACEMENT TOTAL KNEE BILATERAL    . TONSILLECTOMY AND ADENOIDECTOMY     as  a child     Current Outpatient Prescriptions  Medication Sig Dispense Refill  . acetaminophen (TYLENOL) 500 MG tablet Take 500-1,000 mg by mouth every 12 (twelve) hours as needed (pain).     Marland Kitchen amoxicillin-clavulanate (AUGMENTIN) 875-125 MG tablet     . apixaban (ELIQUIS) 5 MG TABS tablet Take 1 tablet (5 mg total) by mouth 2 (two) times daily. 180 tablet 2  . CALCIUM-VITAMIN D PO Take 1 tablet by mouth 2 (two) times daily.    Marland Kitchen CARAFATE  1 GM/10ML suspension Take 10 mLs by mouth 2 (two) times daily as needed (for mouth).     . cephALEXin (KEFLEX) 250 MG capsule Take 250 mg by mouth daily.    . cholecalciferol (VITAMIN D) 1000 units tablet Take 1,000 Units by mouth daily.    Marland Kitchen dextromethorphan (DELSYM) 30 MG/5ML liquid Take 60 mg by mouth 2 (two) times daily as needed for cough.    . diltiazem (CARDIZEM) 60 MG tablet Take 1 tablet (60 mg total) by mouth 3 (three) times daily. 270 tablet 2  . fluticasone (FLONASE) 50 MCG/ACT nasal spray     . furosemide (LASIX) 20 MG tablet Take 20 mg by mouth daily.    Marland Kitchen lidocaine (XYLOCAINE) 2 % solution     . loperamide (IMODIUM) 2 MG capsule Take by mouth as needed for diarrhea or loose stools.    . metoprolol (LOPRESSOR) 100 MG tablet Take 1 tablet (100 mg total) by mouth 2 (two) times daily. 180 tablet 2  . Multiple Vitamins-Minerals (MULTIVITAMIN ADULT PO) Take 1 tablet by mouth daily.     Marland Kitchen nystatin (MYCOSTATIN) 100000 UNIT/ML suspension     . omeprazole (PRILOSEC) 40 MG capsule Take 40 mg by mouth daily.     Marland Kitchen PROAIR HFA 108 (90 Base) MCG/ACT inhaler as needed.     . Throat Lozenges (COUGH DROPS MT) Use as directed 1 lozenge in the mouth or throat every 4 (four) hours as needed (cough).    . vitamin C (ASCORBIC ACID) 500 MG tablet Take 1,000 mg by mouth daily. Takes 2 tabs daily     No current facility-administered medications for this visit.     Allergies:   Valium [diazepam]; Amiodarone hcl [amiodarone]; Compazine [prochlorperazine]; Other; Thorazine [chlorpromazine]; and Zometa [zoledronic acid]    Social History:  The patient  reports that she has quit smoking. Her smoking use included Cigarettes. She has never used smokeless tobacco. She reports that she does not drink alcohol or use drugs.   Family History:  The patient's family history includes Asthma in her mother; Atrial fibrillation in her sister; CVA in her father; Cancer in her brother and daughter; Pneumonia in her  brother.    ROS:  Please see the history of present illness.   Otherwise, review of systems are positive for no bleeding, no syncope, no orthopnea, no PND, no chest pain, no new strokelike symptoms.   All other systems are reviewed and negative.    PHYSICAL EXAM: VS:  LMP 09/12/1973 (Approximate) Comment: spotting , BMI There is no height or weight on file to calculate BMI. GEN: Well nourished, well developed, in no acute distress HEENT: normal Neck: no JVD, carotid bruits, or masses Cardiac: irregularly irregular;  soft systolic murmur left lower sternal border no  rubs, or gallops  Respiratory:  clear to auscultation bilaterally, normal work of breathing GI: soft, nontender, nondistended, + BS MS: no deformity or atrophy Skin: warm and dry, no rash, 2-3+ chronic lower extremity  edema left greater than right with chronic venous insufficiency changes. Neuro:  Strength and sensation are intact Psych: euthymic mood, full affect   EKG:  EKG 06/10/15-atrial fibrillation heart rate 64, no other abnormalities. Personally viewed-prior Prior EKG from 10/29/11 shows atrial fibrillation with heart rate 110 bpm, no other abnormalities.  ECHO: 06/18/15  - Left ventricle: The cavity size was mildly dilated. Systolic  function was normal. The estimated ejection fraction was in the  range of 55% to 60%. Wall motion was normal; there were no  regional wall motion abnormalities. - Aortic valve: Moderately calcified annulus. Trileaflet; normal  thickness, mildly calcified leaflets. Mobility was not  restricted. - Mitral valve: Calcified annulus. - Right ventricle: The cavity size was normal. Wall thickness was  normal. Systolic function was normal. - Tricuspid valve: There was mild regurgitation. - Pulmonic valve: Transvalvular velocity was within the normal  range. There was no evidence for stenosis. - Inferior vena cava: The vessel was dilated. The respirophasic  diameter changes were in  the normal range (>= 50%), consistent  with elevated central venous pressure.  Recent Labs: 01/26/2016: Magnesium 1.9 02/01/2016: ALT 12 07/19/2016: B Natriuretic Peptide 258.2; BUN 18; Creatinine, Ser 0.96; Hemoglobin 13.6; Platelets 276; Potassium 4.1; Sodium 136    Lipid Panel    Component Value Date/Time   CHOL 143 01/26/2016 0300   TRIG 101 01/26/2016 0300   HDL 43 01/26/2016 0300   CHOLHDL 3.3 01/26/2016 0300   VLDL 20 01/26/2016 0300   LDLCALC 80 01/26/2016 0300      Wt Readings from Last 3 Encounters:  08/11/16 145 lb 9.6 oz (66 kg)  06/08/16 147 lb (66.7 kg)  06/02/16 147 lb (66.7 kg)      Other studies Reviewed: Additional studies/ records that were reviewed today include: office notes, labs, EKG, echoew of the above records demonstrates: as above   ASSESSMENT AND PLAN:  1. Persistent atrial fibrillation -Since 2013. Failed amiodarone. -This patients CHA2DS2-VASc Score and unadjusted Ischemic Stroke Rate (% per year) is equal to 9.7 % stroke rate/year from a score of 6  Above score calculated as 1 point each if present [CHF, HTN, DM, Vascular=MI/PAD/Aortic Plaque, Age if 65-74, or Female] Above score calculated as 2 points each if present [Age > 75, or Stroke/TIA/TE]  -Continuing with chronic anti-coagulation. Eliquis now. Off coumadin. Stroke (INR 1.5).  -Under good rate control with heart rates between 60 and 90 mostly. Continue with metoprolol and diltiazem. She takes split dosing of this medication. Discussed Eliquis. -She had a stroke, left MCA when she had to be off of anticoagulation secondary to gastrointestinal bleeding.   2. Coronary artery disease -Nonobstructive CAD, she believes normal cardiac catheterization in 2013 in [place].  3. Pulmonary hypertension -Possibly secondary pulmonary hypertension from left-sided heart disease previously.  She states that her pulmonary pressures have been stable. In fact, last echocardiogram on 06/18/15 was  reassuring , normal values.  4. Hyperlipidemia -Excellent control previously with red yeast rice extract. LDL 48.   - Now feels tired on Lipitor. More nausea, more stomach issues  - I asked her to discontinue her atorvastatin. She understands the risks versus benefits of statin use in the setting of stroke. This was an embolic stroke so hopefully her risk will not be significantly increased. Her nausea, difficult to eating is causing significant lifestyle modification.  5.Chronic anticoagulation  -previously Dr. Kathyrn Lass checked INR levels on a monthly basis.  - now on Eliquis   6. History of breast  cancer -Prior mastectomy right-sided.  7. History of stroke -During breast cancer treatment after receiving Zometa as well as during a period of cessation of coumadin in the setting of GI bleeding. She is on chronic anticoagulation, now Eliquis. Marland Kitchen  8. Chronic lower extremity edema/venous insufficiency -Prior Lasix,  Now off with 3 gout attacks.  Encouraged thigh-high stockings. She does not like to wear these in the past. Stable chronic condition.  Current medicines are reviewed at length with the patient today.  The patient does not have concerns regarding medicines.  The following changes have been made:  no change  Labs/ tests ordered today include:   No orders of the defined types were placed in this encounter.    Disposition:   FU with [place] in 6 months  Signed, Candee Furbish, MD  09/19/2016 8:19 AM    [facility] [place], [place], [place]  [address] Phone: [phone]; Fax: [phone]

## 2016-09-22 ENCOUNTER — Ambulatory Visit (INDEPENDENT_AMBULATORY_CARE_PROVIDER_SITE_OTHER): Payer: Medicare Other | Admitting: Obstetrics and Gynecology

## 2016-09-22 ENCOUNTER — Encounter: Payer: Self-pay | Admitting: Obstetrics and Gynecology

## 2016-09-22 VITALS — BP 130/70 | HR 66 | Ht 62.0 in | Wt 150.8 lb

## 2016-09-22 DIAGNOSIS — N812 Incomplete uterovaginal prolapse: Secondary | ICD-10-CM

## 2016-09-22 NOTE — Progress Notes (Signed)
GYNECOLOGY  VISIT   HPI: 83 y.o.   Married  Caucasian  female   [phone] with Patient's last menstrual period was 09/12/1973 (approximate).   here for follow up.   Had EMB done due to postmenopausal bleeding 06/08/16.  Results benign atrophy.  Patient was on Elaquis at the time.  Unremarkable pelvic ultrasound in August 2017 prior to this.  Still using her pessary.  No problems with pessary. Leaves it in for 2 months.  No dysuria.  Some urgency and then cannot always void.  No relationship to pessary use or not.  Voids several times a day and once or twice a night.  Leaking urine.  Sometimes looses control and so wears a pad. No difficulty with BMs.  No further bleeding or spotting.  Last time was in November.   GYNECOLOGIC HISTORY: Patient's last menstrual period was 09/12/1973 (approximate). Contraception:  Postmenopausal Menopausal hormone therapy:  none Last mammogram: 07/2014 normal in [place] per patient(hx Rt.Br.ca w/Rt.mastectomy)   Last pap smear:  11-25-15 normal per patient        OB History    Gravida Para Term Preterm AB Living   3 3 3     3    SAB TAB Ectopic Multiple Live Births                     Patient Active Problem List   Diagnosis Date Noted  . Diplopia 01/29/2016  . GERD (gastroesophageal reflux disease) 01/29/2016  . Cerebrovascular accident (CVA) due to embolism of left middle cerebral artery ([place]) 01/29/2016  . Persistent atrial fibrillation ([place])   . Bleeding gastrointestinal   . Gait disturbance, post-stroke 01/27/2016  . Fall   . Secondary hypertension, unspecified   . Cerebral infarction due to thrombosis of left middle cerebral artery (HCC) s/p mechanical thrombectomy   . Malnutrition of moderate degree 01/09/2016  . GI bleed 01/06/2016  . Chronic atrial fibrillation ([place]) 06/10/2015  . Pulmonary hypertension, secondary ([place]) 06/10/2015  . Chronic anticoagulation 06/10/2015  . HLD (hyperlipidemia) 06/10/2015  . Essential hypertension  06/10/2015  . History of stroke 06/10/2015  . Coronary artery disease due to lipid rich plaque 06/10/2015  . Afib ([place])   . Elevated uric acid in blood   . Arthritis of ankle   . Pulmonary HTN     Past Medical History:  Diagnosis Date  . Afib ([place])   . Arthritis of ankle   . CAD (coronary artery disease)   . Cancer [facility]) 2012   breast cancer-right  . Compression fracture of T12 vertebra (HCC)   . Diverticulosis   . Edema   . Elevated uric acid in blood   . GI bleed 12/2015  . Hypertension   . Long-term (current) use of anticoagulants   . Moderate mitral regurgitation   . Pulmonary HTN   . Stroke ([place]) 01/25/2016  . TIA (transient ischemic attack) 09/2011  . Urinary incontinence     Past Surgical History:  Procedure Laterality Date  . APPENDECTOMY    . BREAST SURGERY  2012   Rt.mastectomy--[place], [place]  2013  . CATARACT EXTRACTION, BILATERAL    . DILATION AND CURETTAGE OF UTERUS     in her early 50's for DUB  . ESOPHAGOGASTRODUODENOSCOPY Left 01/08/2016   Procedure: ESOPHAGOGASTRODUODENOSCOPY (EGD);  Surgeon: Arta Silence, MD;  Location: [facility];  Service: Endoscopy;  Laterality: Left;  . GALLBLADDER SURGERY    . MASTECTOMY Right   . RADIOLOGY WITH ANESTHESIA N/A  01/25/2016   Procedure: RADIOLOGY WITH ANESTHESIA;  Surgeon: Luanne Bras, MD;  Location: [facility];  Service: Radiology;  Laterality: N/A;  . REPLACEMENT TOTAL KNEE BILATERAL    . TONSILLECTOMY AND ADENOIDECTOMY     as a child    Current Outpatient Prescriptions  Medication Sig Dispense Refill  . acetaminophen (TYLENOL) 500 MG tablet Take 500-1,000 mg by mouth every 12 (twelve) hours as needed (pain).     Marland Kitchen amoxicillin-clavulanate (AUGMENTIN) 875-125 MG tablet     . apixaban (ELIQUIS) 5 MG TABS tablet Take 1 tablet (5 mg total) by mouth 2 (two) times daily. 180 tablet 2  . CALCIUM-VITAMIN D PO Take 1 tablet by mouth 2 (two) times daily.    Marland Kitchen CARAFATE 1 GM/10ML  suspension Take 10 mLs by mouth 2 (two) times daily as needed (for mouth).     . cephALEXin (KEFLEX) 250 MG capsule Take 250 mg by mouth daily.    . cholecalciferol (VITAMIN D) 1000 units tablet Take 1,000 Units by mouth daily.    Marland Kitchen dextromethorphan (DELSYM) 30 MG/5ML liquid Take 60 mg by mouth 2 (two) times daily as needed for cough.    . diltiazem (CARDIZEM) 60 MG tablet Take 1 tablet (60 mg total) by mouth 3 (three) times daily. 270 tablet 2  . fluticasone (FLONASE) 50 MCG/ACT nasal spray     . furosemide (LASIX) 20 MG tablet Take 20 mg by mouth daily.    Marland Kitchen lidocaine (XYLOCAINE) 2 % solution     . loperamide (IMODIUM) 2 MG capsule Take by mouth as needed for diarrhea or loose stools.    . metoprolol (LOPRESSOR) 100 MG tablet Take 1 tablet (100 mg total) by mouth 2 (two) times daily. 180 tablet 2  . Multiple Vitamins-Minerals (MULTIVITAMIN ADULT PO) Take 1 tablet by mouth daily.     Marland Kitchen nystatin (MYCOSTATIN) 100000 UNIT/ML suspension     . omeprazole (PRILOSEC) 40 MG capsule Take 40 mg by mouth daily.     Marland Kitchen PROAIR HFA 108 (90 Base) MCG/ACT inhaler as needed.     . Red Yeast Rice Extract (RED YEAST RICE PO) Take 1 tablet by mouth 2 (two) times daily.    . Throat Lozenges (COUGH DROPS MT) Use as directed 1 lozenge in the mouth or throat every 4 (four) hours as needed (cough).    . vitamin C (ASCORBIC ACID) 500 MG tablet Take 1,000 mg by mouth daily. Takes 2 tabs daily     No current facility-administered medications for this visit.      ALLERGIES: Valium [diazepam]; Amiodarone hcl [amiodarone]; Compazine [prochlorperazine]; Other; Thorazine [chlorpromazine]; and Zometa [zoledronic acid]  Family History  Problem Relation Age of Onset  . Asthma Mother   . CVA Father   . Atrial fibrillation Sister     HAS PACER  . Pneumonia Brother   . Cancer Brother     BREAST CANCER  . Cancer Daughter     COLON CANCER    Social History   Social History  . Marital status: Married    Spouse name:  N/A  . Number of children: N/A  . Years of education: N/A   Occupational History  . Not on file.   Social History Main Topics  . Smoking status: Former Smoker    Types: Cigarettes  . Smokeless tobacco: Never Used  . Alcohol use No  . Drug use: No  . Sexual activity: Yes    Partners: Male    Birth control/ protection: Post-menopausal  Other Topics Concern  . Not on file   Social History Narrative  . No narrative on file    ROS:  Pertinent items are noted in HPI.  PHYSICAL EXAMINATION:    BP 130/70 (BP Location: Left Arm, Patient Position: Bed low/side rails up, Cuff Size: Normal)   Pulse 66   Ht 5\' 2"  (1.575 m)   Wt 150 lb 12.8 oz (68.4 kg)   LMP 09/12/1973 (Approximate) Comment: spotting  BMI 27.58 kg/m     General appearance: alert, cooperative and appears stated age   Pelvic: External genitalia:  no lesions              Urethra:  normal appearing urethra with no masses, tenderness or lesions              Bartholins and Skenes: normal                 Vagina: normal appearing vagina with normal color and discharge, no lesions.  Third degree cystocele and second degree rectocele.  First degree uterine prolapse.              Cervix: no lesions                Bimanual Exam:  Uterus:  normal size, contour, position, consistency, mobility, non-tender              Adnexa: no mass, fullness, tenderness         Pessary removed, cleansed, and replaced.   Chaperone was present for exam.  ASSESSMENT  Incomplete uterovaginal prolapse. Doing well with pessary. Postmenopausal bleeding probably due to atrophy and vaginal contact with the pessary.  Reassuring EMB and vaginal ultrasound.  On elaquis due to hx Afib and stroke.  PLAN  Continue pessary care.  Continue with water based moisturizers to vagina.  Follow up in 4 - 6 months, sooner if develops red vaginal bleeding.   An After Visit Summary was printed and given to the patient.  ____15__ minutes face to face  time of which over 50% was spent in counseling.

## 2016-09-23 ENCOUNTER — Encounter: Payer: Self-pay | Admitting: Obstetrics and Gynecology

## 2016-09-26 ENCOUNTER — Ambulatory Visit: Payer: Medicare Other | Admitting: Physical Therapy

## 2016-09-28 ENCOUNTER — Ambulatory Visit: Payer: Medicare Other

## 2016-10-05 ENCOUNTER — Ambulatory Visit: Payer: Medicare Other | Attending: Internal Medicine | Admitting: Physical Therapy

## 2016-10-05 ENCOUNTER — Encounter: Payer: Self-pay | Admitting: Physical Therapy

## 2016-10-05 DIAGNOSIS — R2689 Other abnormalities of gait and mobility: Secondary | ICD-10-CM | POA: Diagnosis not present

## 2016-10-05 DIAGNOSIS — M6281 Muscle weakness (generalized): Secondary | ICD-10-CM | POA: Insufficient documentation

## 2016-10-05 DIAGNOSIS — R29898 Other symptoms and signs involving the musculoskeletal system: Secondary | ICD-10-CM | POA: Diagnosis not present

## 2016-10-05 NOTE — Therapy (Signed)
[facility] [address] [place], [place], [address] Phone: [phone]   Fax:  [phone]  Physical Therapy Evaluation  Patient Details  Name: Adrienne [name] MRN: [id number] Date of Birth: [birth year] Referring Provider: Delrae Rend, MD  Encounter Date: 10/05/2016      PT End of Session - 10/05/16 1228    Visit Number 1   Number of Visits 10   Date for PT Re-Evaluation 12/03/16   Authorization Type g codes at 10th visit   PT Start Time 1231   PT Stop Time 1312   PT Time Calculation (min) 41 min   Activity Tolerance Patient tolerated treatment well   Behavior During Therapy [facility] for tasks assessed/performed      Past Medical History:  Diagnosis Date  . Afib ([place])   . Arthritis of ankle   . CAD (coronary artery disease)   . Cancer [facility]) 2012   breast cancer-right  . Compression fracture of T12 vertebra (HCC)   . Diverticulosis   . Edema   . Elevated uric acid in blood   . GI bleed 12/2015  . Hypertension   . Long-term (current) use of anticoagulants   . Moderate mitral regurgitation   . Pulmonary HTN   . Stroke ([place]) 01/25/2016  . TIA (transient ischemic attack) 09/2011  . Urinary incontinence     Past Surgical History:  Procedure Laterality Date  . APPENDECTOMY    . BREAST SURGERY  2012   Rt.mastectomy--[place], [place]  2013  . CATARACT EXTRACTION, BILATERAL    . DILATION AND CURETTAGE OF UTERUS     in her early 1's for DUB  . ESOPHAGOGASTRODUODENOSCOPY Left 01/08/2016   Procedure: ESOPHAGOGASTRODUODENOSCOPY (EGD);  Surgeon: Arta Silence, MD;  Location: [facility];  Service: Endoscopy;  Laterality: Left;  . GALLBLADDER SURGERY    . MASTECTOMY Right   . RADIOLOGY WITH ANESTHESIA N/A 01/25/2016   Procedure: RADIOLOGY WITH ANESTHESIA;  Surgeon: Luanne Bras, MD;  Location: [facility];  Service: Radiology;  Laterality: N/A;  . REPLACEMENT TOTAL KNEE BILATERAL    .  TONSILLECTOMY AND ADENOIDECTOMY     as a child    There were no vitals filed for this visit.       Subjective Assessment - 10/05/16 1232    Subjective Every now and then will have some pain where the fracture is.  Will sometimes use a walker in public especially around children because people coming past me will set me off balance.  Husband is there for support most of the time as well.   Pertinent History osteoporosis, T12 fracture, history of CA   Patient Stated Goals feel strong in my back and not have any pain   Currently in Pain? Yes   Pain Score 5    Pain Location Back   Pain Orientation Mid   Pain Descriptors / Indicators Dull;Discomfort   Pain Type Acute pain   Pain Onset More than a month ago   Pain Frequency Intermittent   Aggravating Factors  getting up from recliner   Pain Relieving Factors goes away quickly   Effect of Pain on Daily Activities none   Multiple Pain Sites No            [facility] PT Assessment - 10/05/16 0001      Assessment   Medical Diagnosis M48.50XD vertebral compression fracture with routine healing subsequent encounter, M81.0 osteoporosis   Referring Provider Delrae Rend, MD   Onset Date/Surgical Date  09/15/16   Next MD Visit 10/06/16   Prior Therapy no     Precautions   Precautions None     Restrictions   Weight Bearing Restrictions No     Balance Screen   Has the patient fallen in the past 6 months No     [place] residence   Living Arrangements Spouse/significant other   Type of [place] Access --  one level; tiny STE     Prior Function   Level of Independence Independent     Cognition   Overall Cognitive Status Within Functional Limits for tasks assessed     Posture/Postural Control   Posture/Postural Control Postural limitations     Strength   Right Hip Flexion 4+/5   Right Hip Extension 4+/5   Right Hip External Rotation  4/5   Right Hip Internal Rotation 4/5    Right Hip ABduction 3+/5   Right Hip ADduction 3+/5   Left Hip Flexion 4+/5   Left Hip Extension 4+/5   Left Hip External Rotation 4-/5   Left Hip Internal Rotation 5/5   Left Hip ABduction 3+/5   Left Hip ADduction 3+/5   Right Knee Flexion 4/5   Right Knee Extension 4+/5   Left Knee Flexion 4/5   Left Knee Extension 4+/5     Ambulation/Gait   Gait Pattern Decreased stride length;Trunk flexed;Wide base of support     Standardized Balance Assessment   Standardized Balance Assessment Berg Balance Test;Timed Up and Go Test;Five Times Sit to Stand     Berg Balance Test   Sit to Stand Able to stand without using hands and stabilize independently   Standing Unsupported Able to stand safely 2 minutes   Sitting with Back Unsupported but Feet Supported on Floor or Stool Able to sit safely and securely 2 minutes   Stand to Sit Sits safely with minimal use of hands   Transfers Able to transfer safely, minor use of hands   Standing Unsupported with Eyes Closed Able to stand 10 seconds safely   Standing Ubsupported with Feet Together Able to place feet together independently and stand 1 minute safely   From Standing, Reach Forward with Outstretched Arm Can reach forward >5 cm safely (2")  3.5"   From Standing Position, Pick up Object from Floor Able to pick up shoe safely and easily   From Standing Position, Turn to Look Behind Over each Shoulder Looks behind from both sides and weight shifts well   Turn 360 Degrees Able to turn 360 degrees safely but slowly   Standing Unsupported, Alternately Place Feet on Step/Stool Able to stand independently and complete 8 steps >20 seconds   Standing Unsupported, One Foot in Front Able to take small step independently and hold 30 seconds   Standing on One Leg Tries to lift leg/unable to hold 3 seconds but remains standing independently   Total Score 46     Timed Up and Go Test   TUG Normal TUG   Normal TUG (seconds) 16                    [facility] Adult PT Treatment/Exercise - 10/05/16 0001      Self-Care   Self-Care Posture   Posture education on sitting posture with lumbar support and scapular and cervical positioning                PT Education - 10/05/16 1315  Education provided Yes   Education Details postural education for improved sitting posture   Person(s) Educated Patient   Methods Explanation;Handout;Tactile cues;Verbal cues   Comprehension Verbalized understanding          PT Short Term Goals - 10/05/16 1329      PT SHORT TERM GOAL #1   Title independent with initial HEP   Time 4   Period Weeks   Status New     PT SHORT TERM GOAL #2   Title Pt will improve BERG score to >/=47/56 to decr. falls risk.   Baseline 46/56   Time 4   Period Weeks   Status New     PT SHORT TERM GOAL #3   Title Pain reduced by 50% when getting out of recliner   Time 4   Period Weeks   Status New     PT SHORT TERM GOAL #4   Title TUG < or = to 14 sec for decreased fall risk   Time 4   Period Weeks   Status New           PT Long Term Goals - 10/05/16 1411      PT LONG TERM GOAL #1   Title independent with advanced HEP   Time 8   Period Weeks   Status New     PT LONG TERM GOAL #2   Title Pt will improve BERG score to >/=51/56 to decr. falls risk   Time 8   Period Weeks   Status New     PT LONG TERM GOAL #3   Title Increased height to 158 cm on scale to demonstrate improved posture   Baseline 156 cm   Time 8   Period Weeks   Status New     PT LONG TERM GOAL #4   Title TUG < or = to 13 sec for reduced fall risk    Time 8   Period Weeks   Status New     PT LONG TERM GOAL #5   Title .Marland KitchenMarland Kitchen               Plan - 10/05/16 1321    Clinical Impression Statement Pt presented for low complexity eval due to stable condition.  Pt was referred from physician due to T12 fracture and osteopenia.  Pt has not fallen but does have some balance issues and needs to use walker in certain  situations due to feeling unsteady.  Pt demonstrates from 3+/5 to 4+/5 LE weaknesses bilaterally.  Mid back pain 5/10.  Poor gait with flexed trunk and decreased step length.  Poor posture with increased kyphosis of thoracic spine and forward head, rounded shoulders.  Current height measured at 156 cm.  Pt demonstrates increased fall risk with 46/56 Berg balance score and 16 sec to complete TUG test.   Pt has good understanding of her condition and will benefit from skilled PT to address impairments and reduce risk of falls and stress fractures.   Rehab Potential Excellent   Clinical Impairments Affecting Rehab Potential osteoporosis, LE edema, A-fib   PT Frequency 2x / week   PT Duration 8 weeks   PT Treatment/Interventions ADLs/Self Care Home Management;Cryotherapy;Moist Heat;Electrical Stimulation;Gait training;Stair training;Functional mobility training;Therapeutic activities;Therapeutic exercise;Balance training;Neuromuscular re-education;Patient/family education;Manual techniques   PT Next Visit Plan f/u with posture education, postural strengthening, LE strenghtening, balance   Recommended Other Services none   Consulted and Agree with Plan of Care Patient      Patient will benefit from  skilled therapeutic intervention in order to improve the following deficits and impairments:  Abnormal gait, Decreased activity tolerance, Decreased balance, Decreased endurance, Decreased range of motion, Decreased strength, Postural dysfunction, Pain, Increased edema  Visit Diagnosis: Other abnormalities of gait and mobility  Muscle weakness (generalized)      G-Codes - Nov 03, 2016 1319    Functional Assessment Tool Used Berg balance, TUG, postural assessment, clinical impression   Functional Limitation Changing and maintaining body position   Changing and Maintaining Body Position Current Status [id number]) At least 40 percent but less than 60 percent impaired, limited or restricted   Changing and  Maintaining Body Position Goal Status [id number]) At least 20 percent but less than 40 percent impaired, limited or restricted       Problem List Patient Active Problem List   Diagnosis Date Noted  . Diplopia 01/29/2016  . GERD (gastroesophageal reflux disease) 01/29/2016  . Cerebrovascular accident (CVA) due to embolism of left middle cerebral artery ([place]) 01/29/2016  . Persistent atrial fibrillation ([place])   . Bleeding gastrointestinal   . Gait disturbance, post-stroke 01/27/2016  . Fall   . Secondary hypertension, unspecified   . Cerebral infarction due to thrombosis of left middle cerebral artery (HCC) s/p mechanical thrombectomy   . Malnutrition of moderate degree 01/09/2016  . GI bleed 01/06/2016  . Chronic atrial fibrillation ([place]) 06/10/2015  . Pulmonary hypertension, secondary ([place]) 06/10/2015  . Chronic anticoagulation 06/10/2015  . HLD (hyperlipidemia) 06/10/2015  . Essential hypertension 06/10/2015  . History of stroke 06/10/2015  . Coronary artery disease due to lipid rich plaque 06/10/2015  . Afib ([place])   . Elevated uric acid in blood   . Arthritis of ankle   . Pulmonary HTN     Zannie Cove, PT 11/03/16, 2:22 PM  [facility] [address] [place], [place], [address] Phone: [phone]   Fax:  [phone]  Name: Adrienne [name] MRN: [id number] Date of Birth: 09-17-1927

## 2016-10-05 NOTE — Patient Instructions (Signed)
Posture - Standing   Good posture is important. Avoid slouching and forward head thrust. Maintain curve in low back and align ears over shoulders, hips over ankles.  Pull your belly button in toward your back bone. Posture Tips DO: - stand tall and erect - keep chin tucked in - keep head and shoulders in alignment - check posture regularly in mirror or large window - pull head back against headrest in car seat;  Change your position often.  Sit with lumbar support. DON'T: - slouch or slump while watching TV or reading - sit, stand or lie in one position  for too long;  Sitting is especially hard on the spine so if you sit at a desk/use the computer, then stand up often! Copyright  VHI. All rights reserved.  Posture - Sitting  Sit upright, head facing forward. Try using a roll to support lower back. Keep shoulders relaxed, and avoid rounded back. Keep hips level with knees. Avoid crossing legs for long periods. Copyright  VHI. All rights reserved.  Chronic neck strain can develop because of poor posture and faulty work habits  Postural strain related to slumped sitting and forward head posture is a leading cause of headaches, neck and upper back pain  General strengthening and flexibility exercises are helpful in the treatment of neck pain.  Most importantly, you should learn to correct the posture that may be contributing to chronic pain.   Change positions frequently  Change your work or home environment to improve posture and mechanics.    

## 2016-10-06 ENCOUNTER — Encounter: Payer: Self-pay | Admitting: Physical Therapy

## 2016-10-06 ENCOUNTER — Ambulatory Visit: Payer: Medicare Other | Admitting: Physical Therapy

## 2016-10-06 DIAGNOSIS — Z8673 Personal history of transient ischemic attack (TIA), and cerebral infarction without residual deficits: Secondary | ICD-10-CM | POA: Diagnosis not present

## 2016-10-06 DIAGNOSIS — M810 Age-related osteoporosis without current pathological fracture: Secondary | ICD-10-CM | POA: Diagnosis not present

## 2016-10-06 DIAGNOSIS — M4850XD Collapsed vertebra, not elsewhere classified, site unspecified, subsequent encounter for fracture with routine healing: Secondary | ICD-10-CM | POA: Diagnosis not present

## 2016-10-06 DIAGNOSIS — M6281 Muscle weakness (generalized): Secondary | ICD-10-CM | POA: Diagnosis not present

## 2016-10-06 DIAGNOSIS — R29898 Other symptoms and signs involving the musculoskeletal system: Secondary | ICD-10-CM

## 2016-10-06 DIAGNOSIS — R2689 Other abnormalities of gait and mobility: Secondary | ICD-10-CM | POA: Diagnosis not present

## 2016-10-06 NOTE — Therapy (Signed)
[facility] [address] [place], [place], [address] Phone: [phone]   Fax:  [phone]  Physical Therapy Treatment  Patient Details  Name: Adrienne [name] MRN: [id number] Date of Birth: 26-Aug-1928 Referring Provider: Delrae Rend, MD  Encounter Date: 10/06/2016      PT End of Session - 10/06/16 1444    Visit Number 2   Number of Visits 10   Date for PT Re-Evaluation 11-Dec-2016   Authorization Type g codes at 10th visit   PT Start Time 0800   PT Stop Time 0845   PT Time Calculation (min) 45 min   Activity Tolerance Patient tolerated treatment well   Behavior During Therapy [facility] for tasks assessed/performed      Past Medical History:  Diagnosis Date  . Afib ([place])   . Arthritis of ankle   . CAD (coronary artery disease)   . Cancer [facility]) 2012   breast cancer-right  . Compression fracture of T12 vertebra (HCC)   . Diverticulosis   . Edema   . Elevated uric acid in blood   . GI bleed 12/2015  . Hypertension   . Long-term (current) use of anticoagulants   . Moderate mitral regurgitation   . Pulmonary HTN   . Stroke ([place]) 01/25/2016  . TIA (transient ischemic attack) 09/2011  . Urinary incontinence     Past Surgical History:  Procedure Laterality Date  . APPENDECTOMY    . BREAST SURGERY  2012   Rt.mastectomy--[place], [place]  2013  . CATARACT EXTRACTION, BILATERAL    . DILATION AND CURETTAGE OF UTERUS     in her early 39's for DUB  . ESOPHAGOGASTRODUODENOSCOPY Left 01/08/2016   Procedure: ESOPHAGOGASTRODUODENOSCOPY (EGD);  Surgeon: Arta Silence, MD;  Location: [facility];  Service: Endoscopy;  Laterality: Left;  . GALLBLADDER SURGERY    . MASTECTOMY Right   . RADIOLOGY WITH ANESTHESIA N/A 01/25/2016   Procedure: RADIOLOGY WITH ANESTHESIA;  Surgeon: Luanne Bras, MD;  Location: [facility];  Service: Radiology;  Laterality: N/A;  . REPLACEMENT TOTAL KNEE BILATERAL    .  TONSILLECTOMY AND ADENOIDECTOMY     as a child    There were no vitals filed for this visit.      Subjective Assessment - 10/06/16 1405    Subjective I am not good with balance. I use a walker at church due to my decreased balance.  Other times I will use my husbands hand to help me with balance.    Patient Stated Goals feel strong in my back and not have any pain   Currently in Pain? No/denies                                 PT Education - 10/06/16 1441    Education provided Yes   Education Details body mechanics, decompression exercises in supine   Person(s) Educated Patient   Methods Explanation;Demonstration;Verbal cues;Handout   Comprehension Returned demonstration;Verbalized understanding          PT Short Term Goals - 10/05/16 1329      PT SHORT TERM GOAL #1   Title independent with initial HEP   Time 4   Period Weeks   Status New     PT SHORT TERM GOAL #2   Title Pt will improve BERG score to >/=47/56 to decr. falls risk.   Baseline 46/56   Time 4   Period Weeks  Status New     PT SHORT TERM GOAL #3   Title Pain reduced by 50% when getting out of recliner   Time 4   Period Weeks   Status New     PT SHORT TERM GOAL #4   Title TUG < or = to 14 sec for decreased fall risk   Time 4   Period Weeks   Status New           PT Long Term Goals - 2016-11-04 1411      PT LONG TERM GOAL #1   Title independent with advanced HEP   Time 8   Period Weeks   Status New     PT LONG TERM GOAL #2   Title Pt will improve BERG score to >/=51/56 to decr. falls risk   Time 8   Period Weeks   Status New     PT LONG TERM GOAL #3   Title Increased height to 158 cm on scale to demonstrate improved posture   Baseline 156 cm   Time 8   Period Weeks   Status New     PT LONG TERM GOAL #4   Title TUG < or = to 13 sec for reduced fall risk    Time 8   Period Weeks   Status New     PT LONG TERM GOAL #5   Title .Marland KitchenMarland Kitchen                Plan - 10/06/16 1445    Clinical Impression Statement Patient understands correct body mechanics with coughing, sneezing, putting shoes on and getting in and out of bed.  Patient understands tips to avoid falls. Patient understands not to flex at the waist instead at the hips.  Patient will benefit from skilled therapy to improve postural strength, improve balance and understand ways to manage osteoporosis.    Rehab Potential Excellent   Clinical Impairments Affecting Rehab Potential osteoporosis, LE edema, A-fib   PT Frequency 2x / week   PT Duration 8 weeks   PT Treatment/Interventions ADLs/Self Care Home Management;Cryotherapy;Moist Heat;Electrical Stimulation;Gait training;Stair training;Functional mobility training;Therapeutic activities;Therapeutic exercise;Balance training;Neuromuscular re-education;Patient/family education;Manual techniques   PT Next Visit Plan f/u with posture education, postural strengthening, LE strenghtening, balance; how to dress   PT Home Exercise Plan Progress as needed   Recommended Other Services None   Consulted and Agree with Plan of Care Patient      Patient will benefit from skilled therapeutic intervention in order to improve the following deficits and impairments:  Abnormal gait, Decreased activity tolerance, Decreased balance, Decreased endurance, Decreased range of motion, Decreased strength, Postural dysfunction, Pain, Increased edema  Visit Diagnosis: Other abnormalities of gait and mobility  Muscle weakness (generalized)  Weakness of both lower extremities       G-Codes - November 04, 2016 1319    Functional Assessment Tool Used Berg balance, TUG, postural assessment, clinical impression   Functional Limitation Changing and maintaining body position   Changing and Maintaining Body Position Current Status [id number]) At least 40 percent but less than 60 percent impaired, limited or restricted   Changing and Maintaining Body Position Goal Status [id number]  At least 20 percent but less than 40 percent impaired, limited or restricted      Problem List Patient Active Problem List   Diagnosis Date Noted  . Diplopia 01/29/2016  . GERD (gastroesophageal reflux disease) 01/29/2016  . Cerebrovascular accident (CVA) due to embolism of left middle cerebral artery ([place]) 01/29/2016  . Persistent  atrial fibrillation ([place])   . Bleeding gastrointestinal   . Gait disturbance, post-stroke 01/27/2016  . Fall   . Secondary hypertension, unspecified   . Cerebral infarction due to thrombosis of left middle cerebral artery (HCC) s/p mechanical thrombectomy   . Malnutrition of moderate degree 01/09/2016  . GI bleed 01/06/2016  . Chronic atrial fibrillation ([place]) 06/10/2015  . Pulmonary hypertension, secondary ([place]) 06/10/2015  . Chronic anticoagulation 06/10/2015  . HLD (hyperlipidemia) 06/10/2015  . Essential hypertension 06/10/2015  . History of stroke 06/10/2015  . Coronary artery disease due to lipid rich plaque 06/10/2015  . Afib ([place])   . Elevated uric acid in blood   . Arthritis of ankle   . Pulmonary HTN     Earlie Counts, PT 10/06/16 2:50 PM   [facility] [address], [place], [place], [address] Phone: [phone]   Fax:  [phone]  Name: Adrienne [name] MRN: [id number] Date of Birth: [birth year]

## 2016-10-06 NOTE — Patient Instructions (Addendum)
  Osteoporosis   What is Osteoporosis?  - A silent disease in which the skeleton is weakened by decreased bone density. - Characterized by low bone mass, deterioration of bone, and increased risk of fracture postmenopausal (primary) or the result of an identifiable condition/event (secondary) - Commonly found in the wrists, spine, and hips; these are high-risk stress areas and very susceptible to fractures.  The Facts: - There are 1.5 million fractures/year o 500,000 spine; 250,000 hip with over 60,000 nursing home admissions secondary to hip fracture; and 200,000 wrist - After hip fracture, only 50% of people able to walk independently prior to the fracture return to independent ambulation. - Bone mass: Peaks at age 20-30, and begins declining at age 40-50.   Osteoporosis is defined by the World Health Organization (WHO) as:  NOF/WHO Criteria for Interpreting Results of Bone Density Assessment  Results Diagnosis  Within 1 standard deviation (SD) of young adult mean Normal  Between 1 and -2.5 SD below mean, repeat in 2 years Low bone mass (osteopenia)  Greater than -2.5 SD below mean Osteoporosis  Greater than -2.5 SD below mean and one or more fragility fractures exist Severe Osteoporosis  *Results can be affected by positioning of the body in the DEXA scan, presence of current or old fractures, arthritis, extraneous calcifications.    Osteoporosis is not just a women's disease!  - 30-40% of women will develop osteoporosis - 5-15% of males will develop osteoporosis   What are the risk factors?  1. Female 2. Thin, small frame 3. Caucasian, Asian race 4. Early menopause (<45 years old)/amenorrhea/delayed puberty 5. Old age 6. Family history (fractures, stooped posture)\ 7. Low calcium diet 8. Sedentary lifestyle 9. Alcohol, Caffeine, Smoking 10. Malnutrition, GI Disease 11. Prolonged use of Glucocorticoids (Prednisone), Meds to treat asthma, arthritis, cancers,  thyroid, and anti-seizure meds.  How do I know for sure?  Get a BONE DENSITY TEST!  This measures bone loss and it's painless, non-invasive, and only takes 5-10 minutes!  What can I do about it?  ? Decrease your risk factors (alcohol, caffeine, smoking) ? Helpful medications (see next page) ? Adequate Calcium and Vitamin D intake ? Get active! o Proper posture - Sit and stand tall! No slouching or twisting o Weight-Bearing Exercise - walking, stair climbing, elliptical; NO jogging or high-impact exercise. o Resistive Exercise - Cybex weight equipment, Nautilus, dumbbells, therabands  **Be sure to maintain proper alignment when lifting any weight!!  **When using equipment, avoid abdominal exercises which involve "crunching" or curling or twisting the trunk, biceps machines, cross-country machines, moving handlebars, or ANY MACHINE WITH ROTATION OR FORWARD BENDING!!!           Approved Pharmacologic Management of Osteoporosis  Agent Approved for prevention Approved for treatment BMD increased spine/hip Fracture reduction  Estrogen/Hormone Therapy (Estrace, Estratab, Ogen, Premarin, Vivell, Prempro, Femhert, Orthoest) Yes Yes 3-6% 35% spine and hip  Bisphosphonates  (Fosamax, Actonel, Boniva) Yes Yes 3-8% 35-50% spine and non-spine  Calcitonin (Miacalcin, Calcimar, Fortical) No Yes 0-3% None stated  Raloxifene (Evista) Yes Yes 2-3% 30-55%  Parathyroid Hormone (Forteo) No Yes, only in those at high risk for fracture None stated 53-65%     Recommended Daily Calcium Intakes   Population Group NIH/NOF* (mg elemental calcium)  Children 1-10 years 800-1200  Children 11-24 years 1200-1500  Men and Women 25-64 years At least 1200  Pregnant/Lactating At least 1200  Postmenopausal women with hormone replacement therapy At least 1200  Postmenopausal women without     hormone replacement therapy At least 1200  Men and women 65 + At least 1200  *In 1987, 1990, 1994, and 2000,  the NIH held consensus conferences on osteoporosis and calcium.  This column shows the most recent recommendations regarding calcium intak for preventing and managing osteoporosis.          Calcium Content of Selected Foods  Dairy Foods Calcium Content (mg) Non-Dairy Foods Calcium Content (mg)  Buttermilk, 1 cup 300 Calcium-fortified juice, 1 cup 300  Milk, 1 cup 300 Salmon, canned with Bones, 2 oz 100  Lactaid milk, 1 cup 300-500 Oysters, raw 13-19 medium 226  Soy milk, 1 cup 200-300 Sardines, canned with bones, 3 oz 372  Yogurt (plain, lowfat) 1 cup 250-300 Shrimp, canned 3 oz 98  Frozen yogurt (fruit) 1 cup 200-600 Collard greens, cooked 1 cup 357  Cheddar, mozzarella, or [place] cheese, 1oz 205 Broccoli, cooked 1 cup 78  Cottage cheese (lowfat) 4 oz 200 Soybeans, cooked 1 cup 131  Part-skim ricotta cheese, 4oz 335 Tofu, 4oz* *  Vanilla ice cream, 1 cup 120-300    *Calcium content of tofu varies depending on processing method; check nutritional label on package for precise calcium content.     Suggested Guidelines for Calcium Supplement Use:  ? Calcium is absorbed most efficiently if taken in small amounts throughout the day.  Always divide the daily dose into smaller amounts if the total daily dose is 500mg or more per day.  The body cannot use more than 500mg Calcium at any one time. ? The use of manufactured supplements is encouraged.  Calcium as bone meal or dolomite may contain lead or other heavy metals as contaminants. ? Calcium supplements should not be taken with high fiber meals or with bulk forming laxatives. ? If calcium carbonate is used as the supplement form, it should be taken with meals to assure that stomach acid production is present to facilitate optimal dissolution and absorption of calcium.  This is important if atrophic gastritis with hypo- or achlorhydria is present, which it is in 20-50% of older individuals. ? It is important to drink plenty  of fluids while using the supplement to help reduce problems with side effects like constipation or bloating.  If these symptoms become a problem, switching to another form of supplement may be the answer. ? Another alternative is calcium-fortified foods, including fruit juices, cereals, and breads.  These foods are now marketed with added calcium and may be less likely to cause side effects. ? Those with personal or family histories of kidney stones should be monitored to assure that hypercalcuria does not occur. CALCIUM INTAKE QUIZ  Dairy products are the primary source of calcium for most people.  For a quick estimate of your daily calcium intake, complete the following steps:  1. Use the chart below to determine your daily intake of calcium from diary foods. Servings of dairy per day 1 2 3 4 5 6 7 8  Milligrams (mg) of calcium: 250 500 750 1000 1250 1500 1750 2000   2.  Enter your total daily calcium intake from dairy foods:     _____mg  3.  Add 350 mg, which is the average for all other dietary sources:                 +            350 mg  4.  The sum of your total daily calcium intake:                 ______mg  5.  Enter the recommended calcium intake for your age from the chart below;         ______mg  6.  Enter your daily intake from step 4 above and subtract:                             -        _______mg  7.  The result is how much additional calcium you need:                                          ______mg      Recommended Daily Calcium Intake  Population Calcium (mg)  Children 1-10 years [phone]  Children 11-24 years 67-1500  Men and women 25-64 At least 1200  Pregnant/Lactating At least 1200  Postmenopausal women with hormone replacement therapy At least 1200  Postmenopausal women without hormone replacement therapy At least 1200  Men and women 65+ At least 1200       SAFETY TIPS FOR FALL PREVENTION   1. Remove throw rugs and make certain carpet edges are  securely fastened to the floor.  2. Reduce clutter, especially in traffic areas of the home.   3. Install/maintain sturdy handrails at stairs.  4. Increase wattage of lighting in hallways, bathrooms, kitchens, stairwells, and entrances to home.  5. Use night-lights near bed, in hallways, and in bathroom to improve night safety.  6. Install safety handrails in shower, tub, and around toilet.  Bathtubs and shower stalls should have non-skid surfaces.  7. When you must reach for something high, use a safety step stool, one with wide steps and a friction surface to stand on.  A type equipped with a high handrail is preferred.  8. If a cane or other walking aid has been recommended, use it to help increase your stability.  9. Wear supportive, cushioned, low-heeled shoes.  Avoid "scuffs" (backless bedroom slippers) and high heels.  10. Avoid rushing to answer a phone, doorbell, or anything else!  A portable phone that you can take from room to room with you is a good idea for security and safety.  11. Exercise regularly and stay active!!    [place] www.NOF.org   Exercise for Osteoporosis; A Safe and Effective Way to Build Bone Density and Muscle Strength By: Burnard Hawthorne, M.A.                                              DO's and DON'T's   Avoid and/or Minimize positions of forward bending ( flexion)  Side bending and rotation of the trunk  Especially when movements occur together   When your back aches:   Don't sit down   Lie down on your back with a small pillow under your head and one under your knees or as outlined by our therapist. Or, lie in the 90/90 position ( on the floor with your feet and legs on the sofa with knees and hips bent to 90 degrees)  Tying or putting on your shoes:   Don't bend over to tie your shoes or put on socks.  Instead, bring one foot up, cross it over the opposite knee and  bend forward (hinge) at the  hips to so the task.  Keep your back straight.  If you cannot do this safely, then you need to use long handled assistive devices such as a shoehorn and sock puller.  Exercising:  Don't engage in ballistic types of exercise routines such as high-impact aerobics or jumping rope  Don't do exercises in the gym that bring you forward (abdominal crunches, sit-ups, touching your  toes, knee-to-chest, straight leg raising.)  Follow a regular exercise program that includes a variety of different weight-bearing activities, such as low-impact aerobics, T' ai chi or walking as your physical therapist advises  Do exercises that emphasize return to normal body alignment and strengthening of the muscles that keep your back straight, as outlined in this program or by your therapist  Household tasks:  Don't reach unnecessarily or twist your trunk when mopping, sweeping, vacuuming, raking, making beds, weeding gardens, getting objects ou of cupboards, etc.  Keep your broom, mop, vacuum, or rake close to you and mover your whole body as you move them. Walk over to the area on which you are working. Arrange kitchen, bathroom, and bedroom shelves so that frequently used items may be reached without excessive bending, twisting, and reaching.  Use a sturdy stool if necessary.  Don't bend from the waist to pick up something up  Off the floor, out of the trunk of your car, or to brush your teeth, wash your face, etc.   Bend at the knees, keeping back straight as possible. Use a reacher if necessary.   Prevention of fracture is the so-called "BOTTOm -Line" in the management of OSTEOPOROSIS. Do not take unnecessary chances in movement. Once a compression fracture occurs, the process is very difficult to control; one fracture is frequently followed by many more.   Decompression Exercise: Basic    Lie on back on firm surface, knees bent, feet flat, arms turned up, out to sides, backs of hands down. Time _2__  minutes. Surface: bed Place a pillow under knees  Copyright  VHI. All rights reserved.  Head Press With Chin Tilt    Tilt chin SLIGHTLY up, mouth closed. Feel weight on back of head. Increase weight on head by pressing head down. Hold _5__ seconds. Relax. Repeat _5__ times. Surface: bed  Copyright  VHI. All rights reserved.  Shoulder Press    Press both shoulders down. Hold _5__ seconds. Repeat _5__ times.  _5__ seconds Repeat _5__ times.  If unable to press one or both shoulders, lie in position a few sessions until you can. Surface: bed   Copyright  VHI. All rights reserved.  Leg Lengthener: Full    Straighten one leg. Pull toes AND forefoot toward knee, extend heel. Lengthen leg by pulling pelvis away from ribs. Hold __5_ seconds. Relax. Repeat 1 time. Re-bend knee. Do other leg. Each leg _5__ times. Surface: bed  Copyright  VHI. All rights reserved.  Leg Straightener / Heel Extender    Straighten one leg down. Pull toes AND forefoot toward knee, extend heel. Hold foot position _5__ seconds. Relax the foot. Repeat 1 time. Re-bend knee. Do other leg. Each leg _5__ times. Surface: bed  Copyright  VHI. All rights reserved.  Sleeping on Back    Place pillow under knees. A pillow with cervical support and a roll around waist are also helpful.   Copyright  VHI. All rights reserved.  Coughing, Sneezing    Stabilize body by bending knees slightly and placing one hand on abdomen  or in small of back to help stabilize your back. Alternatively, hold onto a kitchen counter, table or some sturdy object to minimize sudden bending forces on the back. You can sneeze into a pillow.   Copyright  VHI. All rights reserved.  Eating    Sit, protecting natural arch in low back. Bring food to mouth, not mouth to food. Do not lean on elbows or arms.  Copyright  VHI. All rights reserved.  Getting Dressed    "Perch" on edge of chair to protect natural arch of lower  back. Bring foot up to rest on other knee to put on socks and shoes and to tie shoes or use small stool to support foot. If you cannot do this activity safely, you need elastic shoelaces or Velcro closures and a long-handled shoehorn. Avoid bending over to tie shoes.  Copyright  VHI. All rights reserved.  Getting Into and Out of Bed    Sit on edge of bed, feet on floor. Lie down sideways. Roll over onto back. Keep back straight. Use hand and elbow to lower and raise trunk. Move shoulders and hips at same rate to avoid twisting the back. To get out of bed, reverse movement.   Copyright  VHI. All rights reserved.   Chair Sitting    Sit at edge of seat, spine straight, one leg extended. Put a hand on each thigh and bend forward from the hip, keeping spine straight. Allow hand on extended leg to reach toward toes. Support upper body with other arm. Hold _30__ seconds. Repeat _2__ times per session. Do _1__ sessions per day.  Copyright  VHI. All rights reserved.  [facility] [address] [place], [place] [address] Phone # [phone] Fax [phone]

## 2016-10-11 ENCOUNTER — Ambulatory Visit: Payer: Medicare Other | Admitting: Physical Therapy

## 2016-10-11 ENCOUNTER — Encounter: Payer: Self-pay | Admitting: Physical Therapy

## 2016-10-11 DIAGNOSIS — R2689 Other abnormalities of gait and mobility: Secondary | ICD-10-CM

## 2016-10-11 DIAGNOSIS — R29898 Other symptoms and signs involving the musculoskeletal system: Secondary | ICD-10-CM | POA: Diagnosis not present

## 2016-10-11 DIAGNOSIS — M6281 Muscle weakness (generalized): Secondary | ICD-10-CM

## 2016-10-11 NOTE — Patient Instructions (Signed)
Piriformis Stretch, Sitting    Sit, one ankle on opposite knee, same-side hand on crossed knee. Push down on knee, keeping spine straight. Lean torso forward, with flat back, until tension is felt in hamstrings and gluteals of crossed-leg side. Hold __10_ seconds.  Repeat _5__ times per session. Do _1__ sessions per day.  Copyright  VHI. All rights reserved.

## 2016-10-11 NOTE — Therapy (Signed)
[facility] [address], [place], [place], [address] Phone: [phone]   Fax:  [phone]  Physical Therapy Treatment  Patient Details  Name: Adrienne [name] MRN: [id number] Date of Birth: 1927/11/29 Referring Provider: Delrae Rend, MD  Encounter Date: 10/11/2016      PT End of Session - 10/11/16 1404    Visit Number 3   Number of Visits 10   Date for PT Re-Evaluation 12/28/2016   Authorization Type g codes at 10th visit   PT Start Time 1405   PT Stop Time 1444   PT Time Calculation (min) 39 min   Activity Tolerance Patient tolerated treatment well   Behavior During Therapy [facility] for tasks assessed/performed      Past Medical History:  Diagnosis Date  . Afib ([place])   . Arthritis of ankle   . CAD (coronary artery disease)   . Cancer [facility]) 2012   breast cancer-right  . Compression fracture of T12 vertebra (HCC)   . Diverticulosis   . Edema   . Elevated uric acid in blood   . GI bleed 12/2015  . Hypertension   . Long-term (current) use of anticoagulants   . Moderate mitral regurgitation   . Pulmonary HTN   . Stroke ([place]) 01/25/2016  . TIA (transient ischemic attack) 09/2011  . Urinary incontinence     Past Surgical History:  Procedure Laterality Date  . APPENDECTOMY    . BREAST SURGERY  2012   Rt.mastectomy--[place], [place]  2013  . CATARACT EXTRACTION, BILATERAL    . DILATION AND CURETTAGE OF UTERUS     in her early 60's for DUB  . ESOPHAGOGASTRODUODENOSCOPY Left 01/08/2016   Procedure: ESOPHAGOGASTRODUODENOSCOPY (EGD);  Surgeon: Arta Silence, MD;  Location: [facility];  Service: Endoscopy;  Laterality: Left;  . GALLBLADDER SURGERY    . MASTECTOMY Right   . RADIOLOGY WITH ANESTHESIA N/A 01/25/2016   Procedure: RADIOLOGY WITH ANESTHESIA;  Surgeon: Luanne Bras, MD;  Location: [facility];  Service: Radiology;  Laterality: N/A;  . REPLACEMENT TOTAL KNEE BILATERAL    .  TONSILLECTOMY AND ADENOIDECTOMY     as a child    There were no vitals filed for this visit.      Subjective Assessment - 10/11/16 1414    Subjective States she used the walker at church this weekend.  States she uses the elevator when she has her walker.   Pertinent History osteoporosis, T12 fracture, history of CA   Patient Stated Goals feel strong in my back and not have any pain   Currently in Pain? No/denies                         [facility] Adult PT Treatment/Exercise - 10/11/16 0001      Bed Mobility   Bed Mobility Sit to Supine;Supine to Sit  reviewed correct technique, log rolling     Neck Exercises: Supine   Cervical Isometrics Extension;5 secs;5 reps     Lumbar Exercises: Stretches   Piriformis Stretch 3 reps;10 seconds  sitting     Knee/Hip Exercises: Standing   Forward Lunges Right;Left;10 reps  one hand hold, small ROM   Forward Step Up Right;Left;2 sets;10 reps;Hand Hold: 1;Step Height: 4"     Knee/Hip Exercises: Supine   Quad Sets Right;Left;5 reps  press heal down x 5 sec hold     Shoulder Exercises: Supine   Other Supine Exercises scap squeeze - 5sec  x 5     Shoulder Exercises: Standing   Row Strengthening;Both;20 reps;Theraband   Theraband Level (Shoulder Row) Level 1 (Yellow)                PT Education - 10/11/16 1452    Education provided Yes   Education Details piriformis stretch   Person(s) Educated Patient   Methods Explanation;Demonstration;Verbal cues;Handout   Comprehension Verbalized understanding;Returned demonstration          PT Short Term Goals - 10/11/16 1456      PT SHORT TERM GOAL #1   Title independent with initial HEP   Time 4   Period Weeks   Status Achieved     PT SHORT TERM GOAL #2   Title Pt will improve BERG score to >/=47/56 to decr. falls risk.   Baseline 46/56   Time 4   Period Weeks   Status On-going     PT SHORT TERM GOAL #4   Title TUG < or = to 14 sec for decreased fall risk    Time 4   Period Weeks   Status On-going           PT Long Term Goals - 10/05/16 1411      PT LONG TERM GOAL #1   Title independent with advanced HEP   Time 8   Period Weeks   Status New     PT LONG TERM GOAL #2   Title Pt will improve BERG score to >/=51/56 to decr. falls risk   Time 8   Period Weeks   Status New     PT LONG TERM GOAL #3   Title Increased height to 158 cm on scale to demonstrate improved posture   Baseline 156 cm   Time 8   Period Weeks   Status New     PT LONG TERM GOAL #4   Title TUG < or = to 13 sec for reduced fall risk    Time 8   Period Weeks   Status New     PT LONG TERM GOAL #5   Title .Marland KitchenMarland Kitchen               Plan - 10/11/16 1456    Clinical Impression Statement Pt was able to implement techniques needing to review getting in and out of bed. Reviewed flexing at the hips instead of the waist and was able to demonstrate good technique for reaching forward.  Pt demonstrates LE weakness and decreased balance.  Continues to need PT to address impairments.   Rehab Potential Excellent   Clinical Impairments Affecting Rehab Potential osteoporosis, LE edema, A-fib   PT Frequency 2x / week   PT Duration 8 weeks   PT Treatment/Interventions ADLs/Self Care Home Management;Cryotherapy;Moist Heat;Electrical Stimulation;Gait training;Stair training;Functional mobility training;Therapeutic activities;Therapeutic exercise;Balance training;Neuromuscular re-education;Patient/family education;Manual techniques   PT Next Visit Plan postural strengthening, LE strenghtening, balance; f/U with how to dress   PT Home Exercise Plan Progress as needed   Consulted and Agree with Plan of Care Patient      Patient will benefit from skilled therapeutic intervention in order to improve the following deficits and impairments:  Abnormal gait, Decreased activity tolerance, Decreased balance, Decreased endurance, Decreased range of motion, Decreased strength, Postural  dysfunction, Pain, Increased edema  Visit Diagnosis: Other abnormalities of gait and mobility  Muscle weakness (generalized)     Problem List Patient Active Problem List   Diagnosis Date Noted  . Diplopia 01/29/2016  . GERD (gastroesophageal reflux disease)  01/29/2016  . Cerebrovascular accident (CVA) due to embolism of left middle cerebral artery ([place]) 01/29/2016  . Persistent atrial fibrillation ([place])   . Bleeding gastrointestinal   . Gait disturbance, post-stroke 01/27/2016  . Fall   . Secondary hypertension, unspecified   . Cerebral infarction due to thrombosis of left middle cerebral artery (HCC) s/p mechanical thrombectomy   . Malnutrition of moderate degree 01/09/2016  . GI bleed 01/06/2016  . Chronic atrial fibrillation ([place]) 06/10/2015  . Pulmonary hypertension, secondary ([place]) 06/10/2015  . Chronic anticoagulation 06/10/2015  . HLD (hyperlipidemia) 06/10/2015  . Essential hypertension 06/10/2015  . History of stroke 06/10/2015  . Coronary artery disease due to lipid rich plaque 06/10/2015  . Afib ([place])   . Elevated uric acid in blood   . Arthritis of ankle   . Pulmonary HTN     Zannie Cove, PT 10/11/2016, 3:06 PM  [facility] [address], [place], [place], [address] Phone: [phone]   Fax:  [phone]  Name: Adrienne [name] MRN: [id number] Date of Birth: [birth year]

## 2016-10-12 ENCOUNTER — Encounter: Payer: Self-pay | Admitting: Cardiology

## 2016-10-12 ENCOUNTER — Ambulatory Visit (INDEPENDENT_AMBULATORY_CARE_PROVIDER_SITE_OTHER): Payer: Medicare Other | Admitting: Cardiology

## 2016-10-12 VITALS — BP 118/70 | HR 66 | Ht 62.0 in | Wt 146.0 lb

## 2016-10-12 DIAGNOSIS — E785 Hyperlipidemia, unspecified: Secondary | ICD-10-CM

## 2016-10-12 DIAGNOSIS — Z8673 Personal history of transient ischemic attack (TIA), and cerebral infarction without residual deficits: Secondary | ICD-10-CM | POA: Diagnosis not present

## 2016-10-12 DIAGNOSIS — Z79899 Other long term (current) drug therapy: Secondary | ICD-10-CM | POA: Diagnosis not present

## 2016-10-12 DIAGNOSIS — I10 Essential (primary) hypertension: Secondary | ICD-10-CM

## 2016-10-12 MED ORDER — ATORVASTATIN CALCIUM 10 MG PO TABS: 10.0000 mg | ORAL_TABLET | Freq: Every day | ORAL | 3 refills | Status: DC

## 2016-10-12 NOTE — Progress Notes (Signed)
Cardiology Office Note   Date:  10/12/2016   ID:  Adrienne [name], DOB June 02, 1928, MRN [id number]  PCP:  Tawanna Solo, MD  Cardiologist:   Candee Furbish, MD       History of Present Illness: Adrienne [name] is a 83 y.o. female (I see her husband, she is new from [place]) here for follow up of atrial fibrillation, ? pulmonary hypertension, coronary artery disease, mitral regurgitation on chronic anticoagulation patient of Dr. Kathyrn Lass.  Admitted in April with intermittent bloody stools. Anticoagulation was placed on hold. GI work up unremarkable (Hiatal hernia and diverticulosis). On Jan 25, 2016 had right side weakness and was found in bathroom by husband. Coumadin was restarted about a week prior to this, INR 1.5.Left MCA infarct. Interventional radiology. Transitioned to Eliquis on May 18.    Atrial fibrillation: In the past she has tried amiodarone but now has this listed under allergies. Currently under rate control strategy. Taking diltiazem, metoprolol. Had minor stroke years ago on Zometa in 2013 breast cancer. Right breast.   Coronary artery disease: Detected on catheterization in February 2013, nonobstructive. Echo on 09/2014 showed moderate mitral regurgitation. Normal pulmonary pressures.  Hyperlipidemia-has had aversion to statins in the past. Used to take [employer].  She has had prior visits with Dr. Sabra Heck for dizziness. Vertigo-like symptoms. During that visit she was concerned about cardiology referral for management of A. fib and Coumadin.  Pulmonary hypertension: ? been steady for years. In fact last echocardiogram reassuring with no significant elevation pressures.   Past Medical History:  Diagnosis Date  . Afib ([place])   . Arthritis of ankle   . CAD (coronary artery disease)   . Cancer [facility]) 2012   breast cancer-right  . Compression fracture of T12 vertebra (HCC)   . Diverticulosis   . Edema   . Elevated uric acid in blood   . GI bleed 12/2015    . Hypertension   . Long-term (current) use of anticoagulants   . Moderate mitral regurgitation   . Pulmonary HTN   . Stroke ([place]) 01/25/2016  . TIA (transient ischemic attack) 09/2011  . Urinary incontinence     Past Surgical History:  Procedure Laterality Date  . APPENDECTOMY    . BREAST SURGERY  2012   Rt.mastectomy--[place], [place]  2013  . CATARACT EXTRACTION, BILATERAL    . DILATION AND CURETTAGE OF UTERUS     in her early 83's for DUB  . ESOPHAGOGASTRODUODENOSCOPY Left 01/08/2016   Procedure: ESOPHAGOGASTRODUODENOSCOPY (EGD);  Surgeon: Arta Silence, MD;  Location: [facility];  Service: Endoscopy;  Laterality: Left;  . GALLBLADDER SURGERY    . MASTECTOMY Right   . RADIOLOGY WITH ANESTHESIA N/A 01/25/2016   Procedure: RADIOLOGY WITH ANESTHESIA;  Surgeon: Luanne Bras, MD;  Location: [facility];  Service: Radiology;  Laterality: N/A;  . REPLACEMENT TOTAL KNEE BILATERAL    . TONSILLECTOMY AND ADENOIDECTOMY     as a child     Current Outpatient Prescriptions  Medication Sig Dispense Refill  . acetaminophen (TYLENOL) 500 MG tablet Take 500-1,000 mg by mouth every 12 (twelve) hours as needed (pain).     Marland Kitchen amoxicillin (AMOXIL) 500 MG capsule 500 mg. Patient takes 4 tablets by mouth once prior to dental procedures    . apixaban (ELIQUIS) 5 MG TABS tablet Take 1 tablet (5 mg total) by mouth 2 (two) times daily. 180 tablet 2  . CALCIUM-VITAMIN D PO Take 1 tablet by mouth 2 (two)  times daily.    . cephALEXin (KEFLEX) 250 MG capsule Take 250 mg by mouth daily.    . cholecalciferol (VITAMIN D) 1000 units tablet Take 1,000 Units by mouth daily.    Marland Kitchen dextromethorphan (DELSYM) 30 MG/5ML liquid Take 60 mg by mouth 2 (two) times daily as needed for cough.    . diltiazem (CARDIZEM) 60 MG tablet Take 1 tablet (60 mg total) by mouth 3 (three) times daily. 270 tablet 2  . furosemide (LASIX) 20 MG tablet Take 20 mg by mouth daily.    Marland Kitchen loperamide (IMODIUM) 2 MG  capsule Take by mouth as needed for diarrhea or loose stools.    . metoprolol (LOPRESSOR) 100 MG tablet Take 1 tablet (100 mg total) by mouth 2 (two) times daily. 180 tablet 2  . Multiple Vitamins-Minerals (MULTIVITAMIN ADULT PO) Take 1 tablet by mouth daily.     Marland Kitchen omeprazole (PRILOSEC) 40 MG capsule Take 40 mg by mouth daily.     Marland Kitchen PROAIR HFA 108 (90 Base) MCG/ACT inhaler as needed.     . Red Yeast Rice Extract (RED YEAST RICE PO) Take 1 tablet by mouth 2 (two) times daily.    . Throat Lozenges (COUGH DROPS MT) Use as directed 1 lozenge in the mouth or throat every 4 (four) hours as needed (cough).    . vitamin C (ASCORBIC ACID) 500 MG tablet Take 1,000 mg by mouth daily. Takes 2 tabs daily     No current facility-administered medications for this visit.     Allergies:   Valium [diazepam]; Amiodarone hcl [amiodarone]; Compazine [prochlorperazine]; Other; Thorazine [chlorpromazine]; and Zometa [zoledronic acid]    Social History:  The patient  reports that she has quit smoking. Her smoking use included Cigarettes. She has never used smokeless tobacco. She reports that she does not drink alcohol or use drugs.   Family History:  The patient's family history includes Asthma in her mother; Atrial fibrillation in her sister; CVA in her father; Cancer in her brother and daughter; Pneumonia in her brother.    ROS:  Please see the history of present illness.   Otherwise, review of systems are positive for no bleeding, no syncope, no orthopnea, no PND, no chest pain, no new strokelike symptoms.   All other systems are reviewed and negative.    PHYSICAL EXAM: VS:  BP 118/70   Pulse 66   Ht 5\' 2"  (1.575 m)   Wt 146 lb (66.2 kg)   LMP 09/12/1973 (Approximate) Comment: spotting  BMI 26.70 kg/m  , BMI Body mass index is 26.7 kg/m. GEN: Well nourished, well developed, in no acute distress  HEENT: normal  Neck: no JVD, carotid bruits, or masses Cardiac: irregularly irregular;  soft systolic murmur  left lower sternal border no  rubs, or gallops  Respiratory:  clear to auscultation bilaterally, normal work of breathing GI: soft, nontender, nondistended, + BS MS: no deformity or atrophy  Skin: warm and dry, no rash, 2-3+ chronic lower extremity edema left greater than right with chronic venous insufficiency changes. Neuro:  Strength and sensation are intact Psych: euthymic mood, full affect   EKG:  EKG 06/10/15-atrial fibrillation heart rate 64, no other abnormalities. Personally viewed-prior Prior EKG from 10/29/11 shows atrial fibrillation with heart rate 110 bpm, no other abnormalities.  ECHO: 06/18/15  - Left ventricle: The cavity size was mildly dilated. Systolic  function was normal. The estimated ejection fraction was in the  range of 55% to 60%. Wall motion was normal; there  were no  regional wall motion abnormalities. - Aortic valve: Moderately calcified annulus. Trileaflet; normal  thickness, mildly calcified leaflets. Mobility was not  restricted. - Mitral valve: Calcified annulus. - Right ventricle: The cavity size was normal. Wall thickness was  normal. Systolic function was normal. - Tricuspid valve: There was mild regurgitation. - Pulmonic valve: Transvalvular velocity was within the normal  range. There was no evidence for stenosis. - Inferior vena cava: The vessel was dilated. The respirophasic  diameter changes were in the normal range (>= 50%), consistent  with elevated central venous pressure.  Recent Labs: 01/26/2016: Magnesium 1.9 02/01/2016: ALT 12 07/19/2016: B Natriuretic Peptide 258.2; BUN 18; Creatinine, Ser 0.96; Hemoglobin 13.6; Platelets 276; Potassium 4.1; Sodium 136    Lipid Panel    Component Value Date/Time   CHOL 143 01/26/2016 0300   TRIG 101 01/26/2016 0300   HDL 43 01/26/2016 0300   CHOLHDL 3.3 01/26/2016 0300   VLDL 20 01/26/2016 0300   LDLCALC 80 01/26/2016 0300      Wt Readings from Last 3 Encounters:  10/12/16 146 lb  (66.2 kg)  09/22/16 150 lb 12.8 oz (68.4 kg)  08/11/16 145 lb 9.6 oz (66 kg)      Other studies Reviewed: Additional studies/ records that were reviewed today include: office notes, labs, EKG, echoew of the above records demonstrates: as above   ASSESSMENT AND PLAN:  1. Permanent atrial fibrillation -Since 2013. Failed amiodarone. -This patients CHA2DS2-VASc Score and unadjusted Ischemic Stroke Rate (% per year) is equal to 9.7 % stroke rate/year from a score of 6  Above score calculated as 1 point each if present [CHF, HTN, DM, Vascular=MI/PAD/Aortic Plaque, Age if 65-74, or Female] Above score calculated as 2 points each if present [Age > 75, or Stroke/TIA/TE]  -Continuing with chronic anti-coagulation. Eliquis now. Off coumadin. Stroke (INR 1.5).  -Under good rate control with heart rates between 60 and 90 mostly. Continue with metoprolol and diltiazem. She takes split dosing of this medication. Discussed Eliquis. -She had a stroke, left MCA when she had to be off of anticoagulation secondary to gastrointestinal bleeding.   2. Coronary artery disease -Nonobstructive CAD, she believes normal cardiac catheterization in 2013 in [place].  3. Pulmonary hypertension -She had been diagnosis with this previously in [place] and was getting echocardiograms every 6 months. Echocardiogram here showed normal pulmonary pressures. Reassurance.  4. Hyperlipidemia -LDL 80. Prior stroke. Deserves statin. Agree with neuro. we will start atorvastatin 10 mg once a day and recheck lipid panel in 3 months. She has been anti-statin in the past. She showed me a article from Dr. Talmage Coin regarding co Q 10.  5.Chronic anticoagulation  -previously Dr. Kathyrn Lass checked INR levels on a monthly basis.  - now on Eliquis   6. History of breast cancer -Prior mastectomy right-sided.  7. History of stroke -During breast cancer treatment after receiving Zometa as well as during a period of cessation of  coumadin in the setting of GI bleeding. She is on chronic anticoagulation, now Eliquis. Marland Kitchen  8. Chronic lower extremity edema/venous insufficiency -Prior Lasix,  Now off with 3 gout attacks.  Encouraged thigh-high stockings. She does not like to wear these in the past. Stable chronic condition.  Current medicines are reviewed at length with the patient today.  The patient does not have concerns regarding medicines.  The following changes have been made:  no change  Labs/ tests ordered today include:   No orders of the defined types were placed  in this encounter.    Disposition:   FU with [place] in 6 months  Signed, Candee Furbish, MD  10/12/2016 11:21 AM    [facility] [address], [place], [place]  [address] Phone: [phone]; Fax: [phone]

## 2016-10-12 NOTE — Patient Instructions (Signed)
Medication Instructions:  Please start Atorvastatin 10 mg a day. The current medical regimen is effective;  continue present plan and medications.  Labwork: Please have fasting blood work in 3 months. (Lipid/ALT)  Follow-Up: Follow up in 6 months with Dr. Marlou Porch.  You will receive a letter in the mail 2 months before you are due.  Please call us when you receive this letter to schedule your follow up appointment.  If you need a refill on your cardiac medications before your next appointment, please call your pharmacy.  Thank you for choosing [facility]!!

## 2016-10-13 ENCOUNTER — Encounter: Payer: Self-pay | Admitting: Physical Therapy

## 2016-10-13 ENCOUNTER — Ambulatory Visit: Payer: Medicare Other | Attending: Internal Medicine | Admitting: Physical Therapy

## 2016-10-13 DIAGNOSIS — M6281 Muscle weakness (generalized): Secondary | ICD-10-CM

## 2016-10-13 DIAGNOSIS — R278 Other lack of coordination: Secondary | ICD-10-CM | POA: Insufficient documentation

## 2016-10-13 DIAGNOSIS — R29898 Other symptoms and signs involving the musculoskeletal system: Secondary | ICD-10-CM

## 2016-10-13 DIAGNOSIS — R2689 Other abnormalities of gait and mobility: Secondary | ICD-10-CM | POA: Diagnosis not present

## 2016-10-13 NOTE — Therapy (Signed)
[facility] [address], [place], [place], [address] Phone: [phone]   Fax:  [phone]  Physical Therapy Treatment  Patient Details  Name: Adrienne [name] MRN: [id number] Date of Birth: 30-Jan-1928 Referring Provider: Delrae Rend, MD  Encounter Date: 10/13/2016      PT End of Session - 10/13/16 1440    Visit Number 4   Number of Visits 10   Date for PT Re-Evaluation 28-Dec-2016   Authorization Type g codes at 10th visit   PT Start Time 1402   PT Stop Time 1440   PT Time Calculation (min) 38 min   Activity Tolerance Patient tolerated treatment well   Behavior During Therapy [facility] for tasks assessed/performed      Past Medical History:  Diagnosis Date  . Afib ([place])   . Arthritis of ankle   . CAD (coronary artery disease)   . Cancer [facility]) 2012   breast cancer-right  . Compression fracture of T12 vertebra (HCC)   . Diverticulosis   . Edema   . Elevated uric acid in blood   . GI bleed 12/2015  . Hypertension   . Long-term (current) use of anticoagulants   . Moderate mitral regurgitation   . Pulmonary HTN   . Stroke ([place]) 01/25/2016  . TIA (transient ischemic attack) 09/2011  . Urinary incontinence     Past Surgical History:  Procedure Laterality Date  . APPENDECTOMY    . BREAST SURGERY  2012   Rt.mastectomy--[place], [place]  2013  . CATARACT EXTRACTION, BILATERAL    . DILATION AND CURETTAGE OF UTERUS     in her early 54's for DUB  . ESOPHAGOGASTRODUODENOSCOPY Left 01/08/2016   Procedure: ESOPHAGOGASTRODUODENOSCOPY (EGD);  Surgeon: Arta Silence, MD;  Location: [facility];  Service: Endoscopy;  Laterality: Left;  . GALLBLADDER SURGERY    . MASTECTOMY Right   . RADIOLOGY WITH ANESTHESIA N/A 01/25/2016   Procedure: RADIOLOGY WITH ANESTHESIA;  Surgeon: Luanne Bras, MD;  Location: [facility];  Service: Radiology;  Laterality: N/A;  . REPLACEMENT TOTAL KNEE BILATERAL    .  TONSILLECTOMY AND ADENOIDECTOMY     as a child    There were no vitals filed for this visit.      Subjective Assessment - 10/13/16 1403    Subjective States she did the exercises and stretches.  Denies pain   Pertinent History osteoporosis, T12 fracture, history of CA   Patient Stated Goals feel strong in my back and not have any pain   Currently in Pain? No/denies                         [facility] Adult PT Treatment/Exercise - 10/13/16 0001      Self-Care   Self-Care Posture     Neuro Re-ed    Neuro Re-ed Details  diaphragmatic breathing to elongate the spine, with and without shoulder extension in supine - 10x each     Neck Exercises: Supine   Cervical Isometrics Extension;5 secs;5 reps   Neck Retraction 5 reps;5 secs   Capital Flexion 10 reps   Upper Extremity D2 Extension;10 reps;Theraband   Theraband Level (UE D2) Level 1 (Yellow)     Lumbar Exercises: Stretches   Piriformis Stretch 3 reps;10 seconds  sitting     Lumbar Exercises: Seated   Sit to Stand Limitations 3x5  cues for abdominal bracing     Knee/Hip Exercises: Seated   Long Arc Quad Strengthening;Right;Left;10  reps;Weights  3 sec hold   Long Arc Quad Weight 3 lbs.     Shoulder Exercises: Seated   Extension Strengthening;Right;Left;15 reps;Theraband   Theraband Level (Shoulder Extension) Level 1 (Yellow)   Row 15 reps;Theraband   Theraband Level (Shoulder Row) Level 2 (Red)                  PT Short Term Goals - 10/11/16 1456      PT SHORT TERM GOAL #1   Title independent with initial HEP   Time 4   Period Weeks   Status Achieved     PT SHORT TERM GOAL #2   Title Pt will improve BERG score to >/=47/56 to decr. falls risk.   Baseline 46/56   Time 4   Period Weeks   Status On-going     PT SHORT TERM GOAL #4   Title TUG < or = to 14 sec for decreased fall risk   Time 4   Period Weeks   Status On-going           PT Long Term Goals - 10/05/16 1411      PT  LONG TERM GOAL #1   Title independent with advanced HEP   Time 8   Period Weeks   Status New     PT LONG TERM GOAL #2   Title Pt will improve BERG score to >/=51/56 to decr. falls risk   Time 8   Period Weeks   Status New     PT LONG TERM GOAL #3   Title Increased height to 158 cm on scale to demonstrate improved posture   Baseline 156 cm   Time 8   Period Weeks   Status New     PT LONG TERM GOAL #4   Title TUG < or = to 13 sec for reduced fall risk    Time 8   Period Weeks   Status New     PT LONG TERM GOAL #5   Title .Marland KitchenMarland Kitchen               Plan - 10/13/16 1442    Clinical Impression Statement Pt needed reinforcement of sitting posture, has not been using towel roll, but demonstrated improved thoracic extension with lumbar support.  Pt able to demostrate greater rib excersion with verbal and tactile cueing for breathing.  Pt continues to need skilled PT for improved posture and balance to reduce risk of injury.     Rehab Potential Excellent   Clinical Impairments Affecting Rehab Potential osteoporosis, LE edema, A-fib   PT Frequency 2x / week   PT Duration 8 weeks   PT Treatment/Interventions ADLs/Self Care Home Management;Cryotherapy;Moist Heat;Electrical Stimulation;Gait training;Stair training;Functional mobility training;Therapeutic activities;Therapeutic exercise;Balance training;Neuromuscular re-education;Patient/family education;Manual techniques   PT Next Visit Plan postural strengthening, LE strenghtening, balance; f/U with how to dress   PT Home Exercise Plan Progress as needed   Consulted and Agree with Plan of Care Patient      Patient will benefit from skilled therapeutic intervention in order to improve the following deficits and impairments:  Abnormal gait, Decreased activity tolerance, Decreased balance, Decreased endurance, Decreased range of motion, Decreased strength, Postural dysfunction, Pain, Increased edema  Visit Diagnosis: Other abnormalities  of gait and mobility  Muscle weakness (generalized)  Weakness of both lower extremities     Problem List Patient Active Problem List   Diagnosis Date Noted  . Diplopia 01/29/2016  . GERD (gastroesophageal reflux disease) 01/29/2016  . Cerebrovascular accident (  CVA) due to embolism of left middle cerebral artery ([place]) 01/29/2016  . Persistent atrial fibrillation ([place])   . Bleeding gastrointestinal   . Gait disturbance, post-stroke 01/27/2016  . Fall   . Secondary hypertension, unspecified   . Cerebral infarction due to thrombosis of left middle cerebral artery (HCC) s/p mechanical thrombectomy   . Malnutrition of moderate degree 01/09/2016  . GI bleed 01/06/2016  . Chronic atrial fibrillation ([place]) 06/10/2015  . Pulmonary hypertension, secondary ([place]) 06/10/2015  . Chronic anticoagulation 06/10/2015  . HLD (hyperlipidemia) 06/10/2015  . Essential hypertension 06/10/2015  . History of stroke 06/10/2015  . Coronary artery disease due to lipid rich plaque 06/10/2015  . Afib ([place])   . Elevated uric acid in blood   . Arthritis of ankle   . Pulmonary HTN     Zannie Cove, PT 10/13/2016, 2:46 PM  [facility] [address], [place], [place], [address] Phone: [phone]   Fax:  [phone]  Name: Steva Mulanax MRN: [id number] Date of Birth: 03/20/1928

## 2016-10-18 ENCOUNTER — Encounter: Payer: Self-pay | Admitting: Physical Therapy

## 2016-10-18 ENCOUNTER — Ambulatory Visit: Payer: Medicare Other | Admitting: Physical Therapy

## 2016-10-18 DIAGNOSIS — M6281 Muscle weakness (generalized): Secondary | ICD-10-CM | POA: Diagnosis not present

## 2016-10-18 DIAGNOSIS — R2689 Other abnormalities of gait and mobility: Secondary | ICD-10-CM

## 2016-10-18 DIAGNOSIS — R278 Other lack of coordination: Secondary | ICD-10-CM | POA: Diagnosis not present

## 2016-10-18 DIAGNOSIS — R29898 Other symptoms and signs involving the musculoskeletal system: Secondary | ICD-10-CM

## 2016-10-18 NOTE — Therapy (Signed)
[facility] [address] [place], [place], [address] Phone: [phone]   Fax:  [phone]  Physical Therapy Treatment  Patient Details  Name: Adrienne [name] MRN: [id number] Date of Birth: [birth year] Referring Provider: Delrae Rend, MD  Encounter Date: 10/18/2016      PT End of Session - 10/18/16 1314    Visit Number 5   Number of Visits 10   Date for PT Re-Evaluation 13-Dec-2016   Authorization Type g codes at 10th visit   PT Start Time 1230   PT Stop Time 1313   PT Time Calculation (min) 43 min   Activity Tolerance Patient tolerated treatment well   Behavior During Therapy [facility] for tasks assessed/performed      Past Medical History:  Diagnosis Date  . Afib ([place])   . Arthritis of ankle   . CAD (coronary artery disease)   . Cancer [facility]) 2012   breast cancer-right  . Compression fracture of T12 vertebra (HCC)   . Diverticulosis   . Edema   . Elevated uric acid in blood   . GI bleed 12/2015  . Hypertension   . Long-term (current) use of anticoagulants   . Moderate mitral regurgitation   . Pulmonary HTN   . Stroke ([place]) 01/25/2016  . TIA (transient ischemic attack) 09/2011  . Urinary incontinence     Past Surgical History:  Procedure Laterality Date  . APPENDECTOMY    . BREAST SURGERY  2012   Rt.mastectomy--[place], [place]  2013  . CATARACT EXTRACTION, BILATERAL    . DILATION AND CURETTAGE OF UTERUS     in her early 76's for DUB  . ESOPHAGOGASTRODUODENOSCOPY Left 01/08/2016   Procedure: ESOPHAGOGASTRODUODENOSCOPY (EGD);  Surgeon: Arta Silence, MD;  Location: [facility];  Service: Endoscopy;  Laterality: Left;  . GALLBLADDER SURGERY    . MASTECTOMY Right   . RADIOLOGY WITH ANESTHESIA N/A 01/25/2016   Procedure: RADIOLOGY WITH ANESTHESIA;  Surgeon: Luanne Bras, MD;  Location: [facility];  Service: Radiology;  Laterality: N/A;  . REPLACEMENT TOTAL KNEE BILATERAL    .  TONSILLECTOMY AND ADENOIDECTOMY     as a child    There were no vitals filed for this visit.      Subjective Assessment - 10/18/16 1236    Subjective Reports able to do the exercises at home.  No difficulty dressing but "it is harder to get the shoe tied on the Rt leg.  Had a little pain this morning.   Patient Stated Goals feel strong in my back and not have any pain   Currently in Pain? No/denies                         [facility] Adult PT Treatment/Exercise - 10/18/16 0001      Neuro Re-ed    Neuro Re-ed Details  standing on foam mat Wide and narrow BOS - VC to look forward     Lumbar Exercises: Seated   Sit to Stand Limitations 4 sets x5 reps  cues for abdominal bracing     Knee/Hip Exercises: Aerobic   Nustep L1 x 6 min  PT present educating patient on posture     Knee/Hip Exercises: Standing   Lateral Step Up Right;Left;15 reps;Hand Hold: 1;Step Height: 4"   Forward Step Up Right;Left;2 sets;Hand Hold: 1;Step Height: 4";15 reps     Knee/Hip Exercises: Seated   Long Arc Quad Strengthening;Right;Left;Weights;15 reps  3 sec hold  Long Arc Quad Weight 3 lbs.     Shoulder Exercises: Standing   External Rotation Strengthening;Both;20 reps;Theraband   Theraband Level (Shoulder External Rotation) Level 1 (Yellow)   Extension Strengthening;Both;20 reps;Theraband   Theraband Level (Shoulder Extension) Level 1 (Yellow)   Row Strengthening;Both;20 reps;Theraband   Theraband Level (Shoulder Row) Level 1 (Yellow)                PT Education - 10/18/16 1314    Education provided Yes   Education Details rows, extension, balance   Person(s) Educated Patient   Methods Explanation;Demonstration;Verbal cues;Tactile cues;Handout   Comprehension Verbalized understanding;Returned demonstration          PT Short Term Goals - 10/18/16 1318      PT SHORT TERM GOAL #2   Title Pt will improve BERG score to >/=47/56 to decr. falls risk.   Baseline 46/56    Time 4   Period Weeks   Status On-going           PT Long Term Goals - 10/18/16 1318      PT LONG TERM GOAL #1   Title independent with advanced HEP   Time 8   Period Weeks   Status On-going     PT LONG TERM GOAL #2   Title Pt will improve BERG score to >/=51/56 to decr. falls risk   Time 8   Period Weeks   Status On-going     PT LONG TERM GOAL #3   Title Increased height to 158 cm on scale to demonstrate improved posture   Time 8   Period Weeks   Status On-going     PT LONG TERM GOAL #4   Title TUG < or = to 13 sec for reduced fall risk    Time 8   Period Weeks   Status On-going               Plan - 10/18/16 1316    Clinical Impression Statement Pt progressed with bands and added to HEP, added balance.  Pt continues to have weakness and lack of balance, needs cues for posture throughout session.  Continue PT to work on strength and posutre   Rehab Potential Excellent   Clinical Impairments Affecting Rehab Potential osteoporosis, LE edema, A-fib   PT Frequency 2x / week   PT Duration 8 weeks   PT Treatment/Interventions ADLs/Self Care Home Management;Cryotherapy;Moist Heat;Electrical Stimulation;Gait training;Stair training;Functional mobility training;Therapeutic activities;Therapeutic exercise;Balance training;Neuromuscular re-education;Patient/family education;Manual techniques   PT Next Visit Plan postural strengthening, LE strenghtening, balance   PT Home Exercise Plan Progress as needed   Consulted and Agree with Plan of Care Patient      Patient will benefit from skilled therapeutic intervention in order to improve the following deficits and impairments:  Abnormal gait, Decreased activity tolerance, Decreased balance, Decreased endurance, Decreased range of motion, Decreased strength, Postural dysfunction, Pain, Increased edema  Visit Diagnosis: Other abnormalities of gait and mobility  Muscle weakness (generalized)  Weakness of both lower  extremities     Problem List Patient Active Problem List   Diagnosis Date Noted  . Diplopia 01/29/2016  . GERD (gastroesophageal reflux disease) 01/29/2016  . Cerebrovascular accident (CVA) due to embolism of left middle cerebral artery ([place]) 01/29/2016  . Persistent atrial fibrillation ([place])   . Bleeding gastrointestinal   . Gait disturbance, post-stroke 01/27/2016  . Fall   . Secondary hypertension, unspecified   . Cerebral infarction due to thrombosis of left middle cerebral artery (HCC) s/p mechanical  thrombectomy   . Malnutrition of moderate degree 01/09/2016  . GI bleed 01/06/2016  . Chronic atrial fibrillation ([place]) 06/10/2015  . Pulmonary hypertension, secondary ([place]) 06/10/2015  . Chronic anticoagulation 06/10/2015  . HLD (hyperlipidemia) 06/10/2015  . Essential hypertension 06/10/2015  . History of stroke 06/10/2015  . Coronary artery disease due to lipid rich plaque 06/10/2015  . Afib ([place])   . Elevated uric acid in blood   . Arthritis of ankle   . Pulmonary HTN     Zannie Cove, PT 10/18/2016, 1:19 PM  [facility] [address] [place], [place], [address] Phone: [phone]   Fax:  [phone]  Name: Adrienne [name] MRN: [id number] Date of Birth: 1927/12/05

## 2016-10-18 NOTE — Patient Instructions (Signed)
Balance: Leg Abduction, Eyes Open - Unilateral (Varied Surfaces)    Stand on left leg, eyes open. Reach other leg out to side, foot close to floor. Maintain balance and constant speed. Repeat __10__ times per set. Do ___1_ sets per session. Do __1x__ daily .  Copyright  VHI. All rights reserved.    (Clinic) Extension / Flexion (Assist)    Face pulley, right arm as far forward and up as is pain free. Pull arm down toward side. Repeat __10__ times per set. Do __2__ sets per session. Use yellow band.   Copyright  VHI. All rights reserved.    (Home) Retraction: Row - Bilateral (Anchor)    Facing anchor, arms reaching forward, pull hands toward stomach, pinching shoulder blades together. Repeat ___10_ times per set. Do _2___ sets per session. Yellow band Copyright  VHI. All rights reserved.

## 2016-10-20 ENCOUNTER — Encounter: Payer: Self-pay | Admitting: Physical Therapy

## 2016-10-20 ENCOUNTER — Ambulatory Visit: Payer: Medicare Other | Admitting: Physical Therapy

## 2016-10-20 DIAGNOSIS — R2689 Other abnormalities of gait and mobility: Secondary | ICD-10-CM

## 2016-10-20 DIAGNOSIS — M6281 Muscle weakness (generalized): Secondary | ICD-10-CM

## 2016-10-20 DIAGNOSIS — R29898 Other symptoms and signs involving the musculoskeletal system: Secondary | ICD-10-CM | POA: Diagnosis not present

## 2016-10-20 DIAGNOSIS — R278 Other lack of coordination: Secondary | ICD-10-CM | POA: Diagnosis not present

## 2016-10-20 NOTE — Therapy (Signed)
[facility] [address], [place], [place], [address] Phone: [phone]   Fax:  [phone]  Physical Therapy Treatment  Patient Details  Name: Adrienne [name] MRN: [id number] Date of Birth: Dec 27, 1927 Referring Provider: Delrae Rend, MD  Encounter Date: 10/20/2016      PT End of Session - 10/20/16 1418    Visit Number 6   Number of Visits 10   Date for PT Re-Evaluation December 05, 2016   Authorization Type g codes at 10th visit   PT Start Time 1401   PT Stop Time 1442   PT Time Calculation (min) 41 min   Activity Tolerance Patient tolerated treatment well   Behavior During Therapy [facility] for tasks assessed/performed      Past Medical History:  Diagnosis Date  . Afib ([place])   . Arthritis of ankle   . CAD (coronary artery disease)   . Cancer [facility]) 2012   breast cancer-right  . Compression fracture of T12 vertebra (HCC)   . Diverticulosis   . Edema   . Elevated uric acid in blood   . GI bleed 12/2015  . Hypertension   . Long-term (current) use of anticoagulants   . Moderate mitral regurgitation   . Pulmonary HTN   . Stroke ([place]) 01/25/2016  . TIA (transient ischemic attack) 09/2011  . Urinary incontinence     Past Surgical History:  Procedure Laterality Date  . APPENDECTOMY    . BREAST SURGERY  2012   Rt.mastectomy--[place], [place]  2013  . CATARACT EXTRACTION, BILATERAL    . DILATION AND CURETTAGE OF UTERUS     in her early 47's for DUB  . ESOPHAGOGASTRODUODENOSCOPY Left 01/08/2016   Procedure: ESOPHAGOGASTRODUODENOSCOPY (EGD);  Surgeon: Arta Silence, MD;  Location: [facility];  Service: Endoscopy;  Laterality: Left;  . GALLBLADDER SURGERY    . MASTECTOMY Right   . RADIOLOGY WITH ANESTHESIA N/A 01/25/2016   Procedure: RADIOLOGY WITH ANESTHESIA;  Surgeon: Luanne Bras, MD;  Location: [facility];  Service: Radiology;  Laterality: N/A;  . REPLACEMENT TOTAL KNEE BILATERAL    .  TONSILLECTOMY AND ADENOIDECTOMY     as a child    There were no vitals filed for this visit.      Subjective Assessment - 10/20/16 1447    Subjective Reports a little sore today because she did a lot of cooking yesterday.   Pertinent History osteoporosis, T12 fracture, history of CA   Patient Stated Goals feel strong in my back and not have any pain   Currently in Pain? Yes   Pain Score 2    Pain Location Back   Pain Orientation Mid   Pain Descriptors / Indicators Dull;Discomfort   Pain Type Acute pain   Pain Onset More than a month ago   Pain Frequency Intermittent   Multiple Pain Sites No                         [facility] Adult PT Treatment/Exercise - 10/20/16 0001      Neuro Re-ed    Neuro Re-ed Details  standing on foam mat Wide and narrow BOS - VC to look forward; single leg standing tapping foot on step, tandem standing head turns     Neck Exercises: Supine   Shoulder ABduction 20 reps  red band - horizontal abduction   Upper Extremity D2 Extension;10 reps;Theraband   Theraband Level (UE D2) Level 2 (Red)     Lumbar  Exercises: Supine   Ab Set 20 reps  hip abduction with ab set   Clam 20 reps  red band   Bridge 20 reps     Knee/Hip Exercises: Aerobic   Nustep L2 x 8 min  PT present educating patient on plan of care     Knee/Hip Exercises: Seated   Sit to Sand 2 sets;5 reps  some UE on legs and red band around knees'                  PT Short Term Goals - 10/18/16 1318      PT SHORT TERM GOAL #2   Title Pt will improve BERG score to >/=47/56 to decr. falls risk.   Baseline 46/56   Time 4   Period Weeks   Status On-going           PT Long Term Goals - 10/18/16 1318      PT LONG TERM GOAL #1   Title independent with advanced HEP   Time 8   Period Weeks   Status On-going     PT LONG TERM GOAL #2   Title Pt will improve BERG score to >/=51/56 to decr. falls risk   Time 8   Period Weeks   Status On-going     PT LONG  TERM GOAL #3   Title Increased height to 158 cm on scale to demonstrate improved posture   Time 8   Period Weeks   Status On-going     PT LONG TERM GOAL #4   Title TUG < or = to 13 sec for reduced fall risk    Time 8   Period Weeks   Status On-going               Plan - 10/20/16 1418    Clinical Impression Statement Pt able to do more balance activities and demonstrates improved balance.  Continues to need cues for upright posture during exercises and difficulty with sit to stand due to LE weakness.  Skilled PT for improved strength and balance to reduce risk of injury.   Rehab Potential Excellent   Clinical Impairments Affecting Rehab Potential osteoporosis, LE edema, A-fib   PT Frequency 2x / week   PT Duration 8 weeks   PT Treatment/Interventions ADLs/Self Care Home Management;Cryotherapy;Moist Heat;Electrical Stimulation;Gait training;Stair training;Functional mobility training;Therapeutic activities;Therapeutic exercise;Balance training;Neuromuscular re-education;Patient/family education;Manual techniques   PT Next Visit Plan postural strengthening, LE strenghtening, balance   PT Home Exercise Plan Progress as needed   Consulted and Agree with Plan of Care Patient      Patient will benefit from skilled therapeutic intervention in order to improve the following deficits and impairments:  Abnormal gait, Decreased activity tolerance, Decreased balance, Decreased endurance, Decreased range of motion, Decreased strength, Postural dysfunction, Pain, Increased edema  Visit Diagnosis: Other abnormalities of gait and mobility  Muscle weakness (generalized)     Problem List Patient Active Problem List   Diagnosis Date Noted  . Diplopia 01/29/2016  . GERD (gastroesophageal reflux disease) 01/29/2016  . Cerebrovascular accident (CVA) due to embolism of left middle cerebral artery ([place]) 01/29/2016  . Persistent atrial fibrillation ([place])   . Bleeding gastrointestinal   .  Gait disturbance, post-stroke 01/27/2016  . Fall   . Secondary hypertension, unspecified   . Cerebral infarction due to thrombosis of left middle cerebral artery (HCC) s/p mechanical thrombectomy   . Malnutrition of moderate degree 01/09/2016  . GI bleed 01/06/2016  . Chronic atrial fibrillation ([place]) 06/10/2015  .  Pulmonary hypertension, secondary ([place]) 06/10/2015  . Chronic anticoagulation 06/10/2015  . HLD (hyperlipidemia) 06/10/2015  . Essential hypertension 06/10/2015  . History of stroke 06/10/2015  . Coronary artery disease due to lipid rich plaque 06/10/2015  . Afib ([place])   . Elevated uric acid in blood   . Arthritis of ankle   . Pulmonary HTN     Zannie Cove , PT 10/20/2016, 2:48 PM  [facility] [address] [place], [place], [address] Phone: [phone]   Fax:  [phone]  Name: Espen Midgley MRN: [id number] Date of Birth: [birth year]

## 2016-10-25 ENCOUNTER — Encounter: Payer: Self-pay | Admitting: Physical Therapy

## 2016-10-25 ENCOUNTER — Ambulatory Visit: Payer: Medicare Other | Admitting: Physical Therapy

## 2016-10-25 DIAGNOSIS — M6281 Muscle weakness (generalized): Secondary | ICD-10-CM

## 2016-10-25 DIAGNOSIS — R2689 Other abnormalities of gait and mobility: Secondary | ICD-10-CM | POA: Diagnosis not present

## 2016-10-25 DIAGNOSIS — R29898 Other symptoms and signs involving the musculoskeletal system: Secondary | ICD-10-CM

## 2016-10-25 DIAGNOSIS — R278 Other lack of coordination: Secondary | ICD-10-CM | POA: Diagnosis not present

## 2016-10-25 NOTE — Therapy (Signed)
[facility] [address] [place], [place], [address] Phone: [phone]   Fax:  [phone]  Physical Therapy Treatment  Patient Details  Name: Adrienne [name] MRN: [id number] Date of Birth: 1928-03-02 Referring Provider: Delrae Rend, MD  Encounter Date: 10/25/2016      PT End of Session - 10/25/16 1411    Visit Number 7   Number of Visits 10   Date for PT Re-Evaluation 12/25/2016   Authorization Type g codes at 10th visit   PT Start Time 1402   PT Stop Time 1443   PT Time Calculation (min) 41 min   Activity Tolerance Patient tolerated treatment well   Behavior During Therapy [facility] for tasks assessed/performed      Past Medical History:  Diagnosis Date  . Afib ([place])   . Arthritis of ankle   . CAD (coronary artery disease)   . Cancer [facility]) 2012   breast cancer-right  . Compression fracture of T12 vertebra (HCC)   . Diverticulosis   . Edema   . Elevated uric acid in blood   . GI bleed 12/2015  . Hypertension   . Long-term (current) use of anticoagulants   . Moderate mitral regurgitation   . Pulmonary HTN   . Stroke ([place]) 01/25/2016  . TIA (transient ischemic attack) 09/2011  . Urinary incontinence     Past Surgical History:  Procedure Laterality Date  . APPENDECTOMY    . BREAST SURGERY  2012   Rt.mastectomy--[place], [place]  2013  . CATARACT EXTRACTION, BILATERAL    . DILATION AND CURETTAGE OF UTERUS     in her early 27's for DUB  . ESOPHAGOGASTRODUODENOSCOPY Left 01/08/2016   Procedure: ESOPHAGOGASTRODUODENOSCOPY (EGD);  Surgeon: Arta Silence, MD;  Location: [facility];  Service: Endoscopy;  Laterality: Left;  . GALLBLADDER SURGERY    . MASTECTOMY Right   . RADIOLOGY WITH ANESTHESIA N/A 01/25/2016   Procedure: RADIOLOGY WITH ANESTHESIA;  Surgeon: Luanne Bras, MD;  Location: [facility];  Service: Radiology;  Laterality: N/A;  . REPLACEMENT TOTAL KNEE BILATERAL    .  TONSILLECTOMY AND ADENOIDECTOMY     as a child    There were no vitals filed for this visit.      Subjective Assessment - 10/25/16 1408    Subjective Reports having a little more pain today and not sure why.  Very little bit of pain but when it comes on, feeling it at a 5/10.   Patient Stated Goals feel strong in my back and not have any pain   Currently in Pain? Yes   Pain Score 5    Pain Location Back   Pain Orientation Mid   Pain Descriptors / Indicators Discomfort   Pain Onset More than a month ago   Aggravating Factors  changing positions sit<>stand is the most, standing it will bother sometimes   Pain Relieving Factors goes away quickly   Effect of Pain on Daily Activities none   Multiple Pain Sites No                         [facility] Adult PT Treatment/Exercise - 10/25/16 0001      Neck Exercises: Supine   Shoulder ABduction 20 reps  red band - horizontal abduction     Lumbar Exercises: Supine   Bridge 20 reps   Other Supine Lumbar Exercises isometric hip extension - 5sec hold x 5     Knee/Hip Exercises:  Aerobic   Nustep L2 x 8 min  PT present educating patient on plan of care     Knee/Hip Exercises: Standing   Forward Step Up Right;Left;2 sets;10 reps;Hand Hold: 1;Step Height: 6"   Forward Step Up Limitations glute weakness Rt> Lt     Knee/Hip Exercises: Seated   Long Arc Quad Strengthening;Right;Left;2 sets;10 reps;Weights  3 sec hold   Long Arc Quad Weight 4 lbs.   Sit to [facility] without UE support;15 reps     Shoulder Exercises: Supine   Internal Rotation Strengthening;Both;20 reps;Theraband   Theraband Level (Shoulder Internal Rotation) Level 1 (Yellow)   Flexion AROM;Both;10 reps  for thoracic extension - used can for shoulder support   Other Supine Exercises scap squeeze - 5sec x 10                  PT Short Term Goals - 10/25/16 1407      PT SHORT TERM GOAL #2   Title Pt will improve BERG score to >/=47/56 to decr. falls  risk.   Baseline 46/56   Time 4   Period Weeks   Status On-going     PT SHORT TERM GOAL #3   Title Pain reduced by 50% when getting out of recliner   Time 4   Period Weeks   Status Achieved           PT Long Term Goals - 10/18/16 1318      PT LONG TERM GOAL #1   Title independent with advanced HEP   Time 8   Period Weeks   Status On-going     PT LONG TERM GOAL #2   Title Pt will improve BERG score to >/=51/56 to decr. falls risk   Time 8   Period Weeks   Status On-going     PT LONG TERM GOAL #3   Title Increased height to 158 cm on scale to demonstrate improved posture   Time 8   Period Weeks   Status On-going     PT LONG TERM GOAL #4   Title TUG < or = to 13 sec for reduced fall risk    Time 8   Period Weeks   Status On-going               Plan - 10/25/16 1412    Clinical Impression Statement Performed exercises for specific muscle activation to improved extension in posture during functional activities for reduced symptoms and reduced risk of injury.  When doing sit to stand at end of session patient was able to perform without use of UE and reported no pain.  continues to need skilled PT for improved posture.   Rehab Potential Excellent   Clinical Impairments Affecting Rehab Potential osteoporosis, LE edema, A-fib   PT Frequency 2x / week   PT Duration 8 weeks   PT Treatment/Interventions ADLs/Self Care Home Management;Cryotherapy;Moist Heat;Electrical Stimulation;Gait training;Stair training;Functional mobility training;Therapeutic activities;Therapeutic exercise;Balance training;Neuromuscular re-education;Patient/family education;Manual techniques   PT Next Visit Plan postural strengthening, LE strenghtening, balance   PT Home Exercise Plan Progress as needed   Consulted and Agree with Plan of Care Patient      Patient will benefit from skilled therapeutic intervention in order to improve the following deficits and impairments:  Abnormal gait,  Decreased activity tolerance, Decreased balance, Decreased endurance, Decreased range of motion, Decreased strength, Postural dysfunction, Pain, Increased edema  Visit Diagnosis: Other abnormalities of gait and mobility  Muscle weakness (generalized)  Weakness of both lower extremities  Other lack of coordination     Problem List Patient Active Problem List   Diagnosis Date Noted  . Diplopia 01/29/2016  . GERD (gastroesophageal reflux disease) 01/29/2016  . Cerebrovascular accident (CVA) due to embolism of left middle cerebral artery ([place]) 01/29/2016  . Persistent atrial fibrillation ([place])   . Bleeding gastrointestinal   . Gait disturbance, post-stroke 01/27/2016  . Fall   . Secondary hypertension, unspecified   . Cerebral infarction due to thrombosis of left middle cerebral artery (HCC) s/p mechanical thrombectomy   . Malnutrition of moderate degree 01/09/2016  . GI bleed 01/06/2016  . Chronic atrial fibrillation ([place]) 06/10/2015  . Pulmonary hypertension, secondary ([place]) 06/10/2015  . Chronic anticoagulation 06/10/2015  . HLD (hyperlipidemia) 06/10/2015  . Essential hypertension 06/10/2015  . History of stroke 06/10/2015  . Coronary artery disease due to lipid rich plaque 06/10/2015  . Afib ([place])   . Elevated uric acid in blood   . Arthritis of ankle   . Pulmonary HTN     Zannie Cove, PT 10/25/2016, 4:53 PM  [facility] [address] [place], [place], [address] Phone: [phone]   Fax:  [phone]  Name: Wynetta Teichman MRN: [id number] Date of Birth: 1928/01/16

## 2016-10-27 ENCOUNTER — Ambulatory Visit: Payer: Medicare Other | Admitting: Physical Therapy

## 2016-10-27 ENCOUNTER — Encounter: Payer: Self-pay | Admitting: Physical Therapy

## 2016-10-27 DIAGNOSIS — R29898 Other symptoms and signs involving the musculoskeletal system: Secondary | ICD-10-CM

## 2016-10-27 DIAGNOSIS — R278 Other lack of coordination: Secondary | ICD-10-CM

## 2016-10-27 DIAGNOSIS — M6281 Muscle weakness (generalized): Secondary | ICD-10-CM

## 2016-10-27 DIAGNOSIS — R2689 Other abnormalities of gait and mobility: Secondary | ICD-10-CM | POA: Diagnosis not present

## 2016-10-27 NOTE — Therapy (Signed)
[facility] [address], [place], [place], [address] Phone: [phone]   Fax:  [phone]  Physical Therapy Treatment  Patient Details  Name: Adrienne [name] MRN: [id number] Date of Birth: [birth year] Referring Provider: Delrae Rend, MD  Encounter Date: 10/27/2016      PT End of Session - 10/27/16 1406    Visit Number 8   Number of Visits 10   Date for PT Re-Evaluation 12/03/2016   Authorization Type g codes at 10th visit   PT Start Time 1401   PT Stop Time 1446   PT Time Calculation (min) 45 min   Activity Tolerance Patient tolerated treatment well   Behavior During Therapy [facility] for tasks assessed/performed      Past Medical History:  Diagnosis Date  . Afib ([place])   . Arthritis of ankle   . CAD (coronary artery disease)   . Cancer [facility]) 2012   breast cancer-right  . Compression fracture of T12 vertebra (HCC)   . Diverticulosis   . Edema   . Elevated uric acid in blood   . GI bleed 12/2015  . Hypertension   . Long-term (current) use of anticoagulants   . Moderate mitral regurgitation   . Pulmonary HTN   . Stroke ([place]) 01/25/2016  . TIA (transient ischemic attack) 09/2011  . Urinary incontinence     Past Surgical History:  Procedure Laterality Date  . APPENDECTOMY    . BREAST SURGERY  2012   Rt.mastectomy--[place]  2013  . CATARACT EXTRACTION, BILATERAL    . DILATION AND CURETTAGE OF UTERUS     in her early 35's for DUB  . ESOPHAGOGASTRODUODENOSCOPY Left 01/08/2016   Procedure: ESOPHAGOGASTRODUODENOSCOPY (EGD);  Surgeon: Arta Silence, MD;  Location: [facility];  Service: Endoscopy;  Laterality: Left;  . GALLBLADDER SURGERY    . MASTECTOMY Right   . RADIOLOGY WITH ANESTHESIA N/A 01/25/2016   Procedure: RADIOLOGY WITH ANESTHESIA;  Surgeon: Luanne Bras, MD;  Location: [facility];  Service: Radiology;  Laterality: N/A;  . REPLACEMENT TOTAL KNEE BILATERAL    .  TONSILLECTOMY AND ADENOIDECTOMY     as a child    There were no vitals filed for this visit.      Subjective Assessment - 10/27/16 1407    Subjective Denies pain other than when standing up from sitting, and states she feels hardly any pain.     Pertinent History osteoporosis, T12 fracture, history of CA   Patient Stated Goals feel strong in my back and not have any pain   Currently in Pain? No/denies                         [facility] Adult PT Treatment/Exercise - 10/27/16 0001      Lumbar Exercises: Standing   Other Standing Lumbar Exercises walking in power tower - 15# side step, 20# fwd/back - 4x each way   abdominal bracking throughout      Lumbar Exercises: Seated   Sit to Stand Limitations 4 sets x5 reps  cues for abdominal bracing     Lumbar Exercises: Supine   Bridge 20 reps  with red band for abduction   Other Supine Lumbar Exercises inhale with UE extension     Knee/Hip Exercises: Aerobic   Nustep L1 x 8 min  PT present educating patient on plan of care  PT Short Term Goals - 10/25/16 1407      PT SHORT TERM GOAL #2   Title Pt will improve BERG score to >/=47/56 to decr. falls risk.   Baseline 46/56   Time 4   Period Weeks   Status On-going     PT SHORT TERM GOAL #3   Title Pain reduced by 50% when getting out of recliner   Time 4   Period Weeks   Status Achieved           PT Long Term Goals - 10/18/16 1318      PT LONG TERM GOAL #1   Title independent with advanced HEP   Time 8   Period Weeks   Status On-going     PT LONG TERM GOAL #2   Title Pt will improve BERG score to >/=51/56 to decr. falls risk   Time 8   Period Weeks   Status On-going     PT LONG TERM GOAL #3   Title Increased height to 158 cm on scale to demonstrate improved posture   Time 8   Period Weeks   Status On-going     PT LONG TERM GOAL #4   Title TUG < or = to 13 sec for reduced fall risk    Time 8   Period Weeks    Status On-going               Plan - 10/27/16 1407    Clinical Impression Statement Patient able to perform exercises without increased pain.  Had some instability during side stepping, but improved after one rep.  Continues to have difficulty with sit to stand more today than last session, but only needs minimal use of hands.  Skilled PT needed to address impairments.of strength and balance.   Rehab Potential Excellent   Clinical Impairments Affecting Rehab Potential osteoporosis, LE edema, A-fib   PT Frequency 2x / week   PT Duration 8 weeks   PT Treatment/Interventions ADLs/Self Care Home Management;Cryotherapy;Moist Heat;Electrical Stimulation;Gait training;Stair training;Functional mobility training;Therapeutic activities;Therapeutic exercise;Balance training;Neuromuscular re-education;Patient/family education;Manual techniques   PT Next Visit Plan re assess balance and strength, progress LE and core strength as tolerated   Consulted and Agree with Plan of Care Patient      Patient will benefit from skilled therapeutic intervention in order to improve the following deficits and impairments:  Abnormal gait, Decreased activity tolerance, Decreased balance, Decreased endurance, Decreased range of motion, Decreased strength, Postural dysfunction, Pain, Increased edema  Visit Diagnosis: Other abnormalities of gait and mobility  Muscle weakness (generalized)  Weakness of both lower extremities  Other lack of coordination     Problem List Patient Active Problem List   Diagnosis Date Noted  . Diplopia 01/29/2016  . GERD (gastroesophageal reflux disease) 01/29/2016  . Cerebrovascular accident (CVA) due to embolism of left middle cerebral artery ([place]) 01/29/2016  . Persistent atrial fibrillation ([place])   . Bleeding gastrointestinal   . Gait disturbance, post-stroke 01/27/2016  . Fall   . Secondary hypertension, unspecified   . Cerebral infarction due to thrombosis of left  middle cerebral artery (HCC) s/p mechanical thrombectomy   . Malnutrition of moderate degree 01/09/2016  . GI bleed 01/06/2016  . Chronic atrial fibrillation ([place]) 06/10/2015  . Pulmonary hypertension, secondary ([place]) 06/10/2015  . Chronic anticoagulation 06/10/2015  . HLD (hyperlipidemia) 06/10/2015  . Essential hypertension 06/10/2015  . History of stroke 06/10/2015  . Coronary artery disease due to lipid rich plaque 06/10/2015  . Afib ([place])   .  Elevated uric acid in blood   . Arthritis of ankle   . Pulmonary HTN     Zannie Cove, PT 10/27/2016, 2:51 PM  [facility] [address] [place], [place], [address] Phone: [phone]   Fax:  [phone]  Name: Adrienne [name] MRN: [id number] Date of Birth: Mar 31, 1928

## 2016-11-01 ENCOUNTER — Encounter: Payer: Self-pay | Admitting: Physical Therapy

## 2016-11-01 ENCOUNTER — Ambulatory Visit: Payer: Medicare Other | Admitting: Physical Therapy

## 2016-11-01 DIAGNOSIS — R2689 Other abnormalities of gait and mobility: Secondary | ICD-10-CM

## 2016-11-01 DIAGNOSIS — R29898 Other symptoms and signs involving the musculoskeletal system: Secondary | ICD-10-CM | POA: Diagnosis not present

## 2016-11-01 DIAGNOSIS — M6281 Muscle weakness (generalized): Secondary | ICD-10-CM | POA: Diagnosis not present

## 2016-11-01 DIAGNOSIS — R278 Other lack of coordination: Secondary | ICD-10-CM | POA: Diagnosis not present

## 2016-11-01 NOTE — Patient Instructions (Signed)
(  Clinic) Extension: Cervico-Thoracic With UE Facilitation    Sit with trunk leaning back, towel or wedge at base of neck. Tuck chin, reaching arms up and back or use hands to brace head gently. Hold position __5__ seconds, pressing base of neck against wedge. Repeat __10__ times per set. Do __1__ sets per session. Do __1__ sessions per week.  Copyright  VHI. All rights reserved.  ABDUCTION: Supine (Active)    Lie on back, legs straight. Draw right leg out to the side as far as possible. Use band around knees Complete _2__ sets of _10__ repetitions. Perform _1__ sessions per day.  Copyright  VHI. All rights reserved.

## 2016-11-01 NOTE — Therapy (Signed)
[facility] [address] [place], [place], [address] Phone: [phone]   Fax:  [phone]  Physical Therapy Treatment  Patient Details  Name: Adrienne [name] MRN: [id number] Date of Birth: 03/24/1928 Referring Provider: Delrae Rend, MD  Encounter Date: 11/01/2016      PT End of Session - 11/01/16 1441    Visit Number 9   Number of Visits 10   Date for PT Re-Evaluation 12/16/16   Authorization Type g codes at 10th visit   PT Start Time 1404   PT Stop Time 1442   PT Time Calculation (min) 38 min   Activity Tolerance Patient tolerated treatment well   Behavior During Therapy [facility] for tasks assessed/performed      Past Medical History:  Diagnosis Date  . Afib ([place])   . Arthritis of ankle   . CAD (coronary artery disease)   . Cancer [facility]) 2012   breast cancer-right  . Compression fracture of T12 vertebra (HCC)   . Diverticulosis   . Edema   . Elevated uric acid in blood   . GI bleed 12/2015  . Hypertension   . Long-term (current) use of anticoagulants   . Moderate mitral regurgitation   . Pulmonary HTN   . Stroke ([place]) 01/25/2016  . TIA (transient ischemic attack) 09/2011  . Urinary incontinence     Past Surgical History:  Procedure Laterality Date  . APPENDECTOMY    . BREAST SURGERY  2012   Rt.mastectomy--[place], [place]  2013  . CATARACT EXTRACTION, BILATERAL    . DILATION AND CURETTAGE OF UTERUS     in her early 66's for DUB  . ESOPHAGOGASTRODUODENOSCOPY Left 01/08/2016   Procedure: ESOPHAGOGASTRODUODENOSCOPY (EGD);  Surgeon: Arta Silence, MD;  [facility];  Service: Endoscopy;  Laterality: Left;  . GALLBLADDER SURGERY    . MASTECTOMY Right   . RADIOLOGY WITH ANESTHESIA N/A 01/25/2016   Procedure: RADIOLOGY WITH ANESTHESIA;  Surgeon: Luanne Bras, MD;  Location: [facility];  Service: Radiology;  Laterality: N/A;  . REPLACEMENT TOTAL KNEE BILATERAL    .  TONSILLECTOMY AND ADENOIDECTOMY     as a child    There were no vitals filed for this visit.      Subjective Assessment - 11/01/16 1415    Subjective States just a little discomfort on Rt side (pointing to lower ribcage) but not feeling it now   Pertinent History osteoporosis, T12 fracture, history of CA   Patient Stated Goals feel strong in my back and not have any pain   Currently in Pain? No/denies                         [facility] Adult PT Treatment/Exercise - 11/01/16 0001      Lumbar Exercises: Supine   Clam 20 reps  red band   Bridge 20 reps  with red band for abduction   Bridge Limitations bridge with LE on bolster 10x BLE; 5x single leg     Knee/Hip Exercises: Supine   Heel Slides Strengthening;Right;Left;2 sets;10 reps  slide into abduction with red band at knees   Hip Adduction Isometric Strengthening;Right;Left;10 reps  bent knee drop out with abdominal bracing     Shoulder Exercises: Seated   Extension Strengthening;Right;Left;Theraband;20 reps   Theraband Level (Shoulder Extension) Level 2 (Red)   External Rotation Strengthening;Both;20 reps;Theraband   Theraband Level (Shoulder External Rotation) Level 2 (Red)   Internal Rotation Strengthening;Right;Left;Theraband;20 reps  Theraband Level (Shoulder Internal Rotation) Level 2 (Red)   Other Seated Exercises seated upper thoracic extension with UE supporting                PT Education - 11/01/16 1441    Education provided Yes   Education Details hip abduction and thoracic extension   Person(s) Educated Patient   Methods Explanation;Tactile cues;Verbal cues;Handout;Demonstration   Comprehension Verbalized understanding;Returned demonstration          PT Short Term Goals - 10/25/16 1407      PT SHORT TERM GOAL #2   Title Pt will improve BERG score to >/=47/56 to decr. falls risk.   Baseline 46/56   Time 4   Period Weeks   Status On-going     PT SHORT TERM GOAL #3   Title  Pain reduced by 50% when getting out of recliner   Time 4   Period Weeks   Status Achieved           PT Long Term Goals - 11/01/16 1800      PT LONG TERM GOAL #1   Title independent with advanced HEP   Baseline added to HEP today   Time 8   Period Weeks   Status On-going     PT LONG TERM GOAL #2   Title Pt will improve BERG score to >/=51/56 to decr. falls risk   Time 8   Period Weeks   Status On-going               Plan - 11/01/16 1442    Clinical Impression Statement Patient continues to have glute med weakness.  Able to perform exercises correctly and added to HEP.  Skilled PT needed for improved posture and balance for reduced risk of injury.   Rehab Potential Excellent   Clinical Impairments Affecting Rehab Potential osteoporosis, LE edema, A-fib   PT Frequency 2x / week   PT Duration 8 weeks   PT Treatment/Interventions ADLs/Self Care Home Management;Cryotherapy;Moist Heat;Electrical Stimulation;Gait training;Stair training;Functional mobility training;Therapeutic activities;Therapeutic exercise;Balance training;Neuromuscular re-education;Patient/family education;Manual techniques   PT Next Visit Plan needs gcodes next visit, check height, TUG, FOTO progress core and extension exercises as tolerated   PT Home Exercise Plan Progress as needed   Consulted and Agree with Plan of Care Patient      Patient will benefit from skilled therapeutic intervention in order to improve the following deficits and impairments:  Abnormal gait, Decreased activity tolerance, Decreased balance, Decreased endurance, Decreased range of motion, Decreased strength, Postural dysfunction, Pain, Increased edema  Visit Diagnosis: Other abnormalities of gait and mobility  Muscle weakness (generalized)     Problem List Patient Active Problem List   Diagnosis Date Noted  . Diplopia 01/29/2016  . GERD (gastroesophageal reflux disease) 01/29/2016  . Cerebrovascular accident (CVA)  due to embolism of left middle cerebral artery ([place]) 01/29/2016  . Persistent atrial fibrillation ([place])   . Bleeding gastrointestinal   . Gait disturbance, post-stroke 01/27/2016  . Fall   . Secondary hypertension, unspecified   . Cerebral infarction due to thrombosis of left middle cerebral artery (HCC) s/p mechanical thrombectomy   . Malnutrition of moderate degree 01/09/2016  . GI bleed 01/06/2016  . Chronic atrial fibrillation ([place]) 06/10/2015  . Pulmonary hypertension, secondary ([place]) 06/10/2015  . Chronic anticoagulation 06/10/2015  . HLD (hyperlipidemia) 06/10/2015  . Essential hypertension 06/10/2015  . History of stroke 06/10/2015  . Coronary artery disease due to lipid rich plaque 06/10/2015  . Afib ([place])   .  Elevated uric acid in blood   . Arthritis of ankle   . Pulmonary HTN     Zannie Cove, PT 11/01/2016, 6:02 PM  [facility] [address] [place], [place], [address] Phone: [phone]   Fax:  [phone]  Name: Juletta Costella MRN: [id number] Date of Birth: 03/05/1928

## 2016-11-03 ENCOUNTER — Encounter: Payer: Self-pay | Admitting: Physical Therapy

## 2016-11-03 ENCOUNTER — Ambulatory Visit: Payer: Medicare Other | Admitting: Physical Therapy

## 2016-11-03 DIAGNOSIS — R278 Other lack of coordination: Secondary | ICD-10-CM | POA: Diagnosis not present

## 2016-11-03 DIAGNOSIS — R2689 Other abnormalities of gait and mobility: Secondary | ICD-10-CM

## 2016-11-03 DIAGNOSIS — R29898 Other symptoms and signs involving the musculoskeletal system: Secondary | ICD-10-CM

## 2016-11-03 DIAGNOSIS — M6281 Muscle weakness (generalized): Secondary | ICD-10-CM

## 2016-11-03 NOTE — Therapy (Signed)
[facility] [address], [place], [place], [address] Phone: [phone]   Fax:  [phone]  Physical Therapy Treatment  Patient Details  Name: Adrienne [name] MRN: [id number] Date of Birth: [birth year] Referring Provider: Delrae Rend, MD  Encounter Date: 11/03/2016      PT End of Session - 11/03/16 1412    Visit Number 10   Number of Visits 10   Date for PT Re-Evaluation 2016-12-26   Authorization Type g codes at 10th visit   PT Start Time 1404   PT Stop Time 1444   PT Time Calculation (min) 40 min   Activity Tolerance Patient tolerated treatment well   Behavior During Therapy [facility] for tasks assessed/performed      Past Medical History:  Diagnosis Date  . Afib ([place])   . Arthritis of ankle   . CAD (coronary artery disease)   . Cancer [facility]) 2012   breast cancer-right  . Compression fracture of T12 vertebra (HCC)   . Diverticulosis   . Edema   . Elevated uric acid in blood   . GI bleed 12/2015  . Hypertension   . Long-term (current) use of anticoagulants   . Moderate mitral regurgitation   . Pulmonary HTN   . Stroke ([place]) 01/25/2016  . TIA (transient ischemic attack) 09/2011  . Urinary incontinence     Past Surgical History:  Procedure Laterality Date  . APPENDECTOMY    . BREAST SURGERY  2012   Rt.mastectomy--[place]  2013  . CATARACT EXTRACTION, BILATERAL    . DILATION AND CURETTAGE OF UTERUS     in her early 84's for DUB  . ESOPHAGOGASTRODUODENOSCOPY Left 01/08/2016   Procedure: ESOPHAGOGASTRODUODENOSCOPY (EGD);  Surgeon: Arta Silence, MD;  Location: [facility];  Service: Endoscopy;  Laterality: Left;  . GALLBLADDER SURGERY    . MASTECTOMY Right   . RADIOLOGY WITH ANESTHESIA N/A 01/25/2016   Procedure: RADIOLOGY WITH ANESTHESIA;  Surgeon: Luanne Bras, MD;  Location: [place];  Service: Radiology;  Laterality: N/A;  . REPLACEMENT TOTAL KNEE BILATERAL    .  TONSILLECTOMY AND ADENOIDECTOMY     as a child    There were no vitals filed for this visit.      Subjective Assessment - 11/03/16 1407    Subjective Pt states just a little soreness today, not too bad.   Pertinent History osteoporosis, T12 fracture, history of CA   Patient Stated Goals feel strong in my back and not have any pain   Currently in Pain? Yes   Pain Score 5    Pain Location Back   Pain Orientation Mid   Pain Descriptors / Indicators Discomfort   Pain Type Acute pain   Pain Onset More than a month ago   Pain Frequency Intermittent   Aggravating Factors  Changing positions sit <> stand is the most, standing, it will bother sometimes   Pain Relieving Factors goes away quickly   Effect of Pain on Daily Activities none   Multiple Pain Sites No            [facility] PT Assessment - 11/03/16 0001      Timed Up and Go Test   TUG Normal TUG   Normal TUG (seconds) 13                     [facility] Adult PT Treatment/Exercise - 11/03/16 0001      Lumbar Exercises: Supine   Clam 20  reps  red band   Bridge 20 reps  with red band for abduction   Bridge Limitations bridge with LE on bolster 10x BLE; 5x single leg     Knee/Hip Exercises: Supine   Heel Slides Strengthening;Right;Left;2 sets;10 reps  slide into abduction with red band at knees   Hip Adduction Isometric Strengthening;Right;Left;10 reps  bent knee drop out with abdominal bracing     Shoulder Exercises: Seated   Extension Strengthening;Right;Left;Theraband;20 reps  Standing   Theraband Level (Shoulder Extension) Level 2 (Red)   Row 15 reps;Theraband  Standing   Theraband Level (Shoulder Row) Level 2 (Red)   Horizontal ABduction Strengthening;Both;20 reps;Theraband   Theraband Level (Shoulder Horizontal ABduction) Level 1 (Yellow)   External Rotation Strengthening;Both;20 reps;Theraband  Standing   Theraband Level (Shoulder External Rotation) Level 2 (Red)   Other Seated Exercises seated  upper thoracic extension with UE supporting  Towel behind back                  PT Short Term Goals - 11/03/16 1412      PT SHORT TERM GOAL #1   Title independent with initial HEP   Time 4   Period Weeks   Status Achieved     PT SHORT TERM GOAL #2   Title Pt will improve BERG score to >/=47/56 to decr. falls risk.   Time 4   Period Weeks   Status On-going     PT SHORT TERM GOAL #4   Title TUG < or = to 14 sec for decreased fall risk   Time 4   Period Weeks   Status On-going           PT Long Term Goals - 11/03/16 1413      PT LONG TERM GOAL #1   Title independent with advanced HEP   Time 8   Period Weeks   Status On-going     PT LONG TERM GOAL #2   Title Pt will improve BERG score to >/=51/56 to decr. falls risk   Time 8   Period Weeks   Status On-going     PT LONG TERM GOAL #3   Title Increased height to 158 cm on scale to demonstrate improved posture   Time 8   Period Weeks   Status On-going     PT LONG TERM GOAL #4   Title TUG < or = to 13 sec for reduced fall risk    Baseline 13   Time 8   Period Weeks   Status Achieved               Plan - 11/03/16 1501    Clinical Impression Statement Pt has met long term goal for TUG of 13 seconds. Pt able to tolerate all standing exercsies well needing verbal cues for posture. Pt continues to have decreased core stability and hip strength. Pt will continue to benefit from skilled therapy for strength and stability.    Rehab Potential Excellent   Clinical Impairments Affecting Rehab Potential osteoporosis, LE edema, A-fib   PT Frequency 2x / week   PT Duration 8 weeks   PT Treatment/Interventions ADLs/Self Care Home Management;Cryotherapy;Moist Heat;Electrical Stimulation;Gait training;Stair training;Functional mobility training;Therapeutic activities;Therapeutic exercise;Balance training;Neuromuscular re-education;Patient/family education;Manual techniques   PT Home Exercise Plan Progress as  needed   Consulted and Agree with Plan of Care Patient      Patient will benefit from skilled therapeutic intervention in order to improve the following deficits and impairments:  Abnormal gait,  Decreased activity tolerance, Decreased balance, Decreased endurance, Decreased range of motion, Decreased strength, Postural dysfunction, Pain, Increased edema  Visit Diagnosis: Other abnormalities of gait and mobility  Muscle weakness (generalized)  Weakness of both lower extremities  Other lack of coordination       G-Codes - 11-04-16 1759    Functional Assessment Tool Used (Outpatient Only) Berg balance, TUG, postural assessment, clinical impression   Functional Limitation Changing and maintaining body position   Changing and Maintaining Body Position Current Status (P1580) At least 40 percent but less than 60 percent impaired, limited or restricted   Changing and Maintaining Body Position Goal Status (W3868) At least 20 percent but less than 40 percent impaired, limited or restricted      Problem List Patient Active Problem List   Diagnosis Date Noted  . Diplopia 01/29/2016  . GERD (gastroesophageal reflux disease) 01/29/2016  . Cerebrovascular accident (CVA) due to embolism of left middle cerebral artery ([place]) 01/29/2016  . Persistent atrial fibrillation ([place])   . Bleeding gastrointestinal   . Gait disturbance, post-stroke 01/27/2016  . Fall   . Secondary hypertension, unspecified   . Cerebral infarction due to thrombosis of left middle cerebral artery (HCC) s/p mechanical thrombectomy   . Malnutrition of moderate degree 01/09/2016  . GI bleed 01/06/2016  . Chronic atrial fibrillation ([place]) 06/10/2015  . Pulmonary hypertension, secondary ([place]) 06/10/2015  . Chronic anticoagulation 06/10/2015  . HLD (hyperlipidemia) 06/10/2015  . Essential hypertension 06/10/2015  . History of stroke 06/10/2015  . Coronary artery disease due to lipid rich plaque 06/10/2015  . Afib ([place])    . Elevated uric acid in blood   . Arthritis of ankle   . Pulmonary HTN     Mikle Bosworth, PTA 04-Nov-2016 6:01 PM   Zannie Cove, PT 11-04-2016 6:01 PM  [facility] [address] [place], [place], [address] Phone: [phone]   Fax:  [phone]  Name: Adrienne [name] MRN: [id number] Date of Birth: April 10, 1928

## 2016-11-08 ENCOUNTER — Ambulatory Visit: Payer: Medicare Other | Admitting: Physical Therapy

## 2016-11-08 ENCOUNTER — Encounter: Payer: Self-pay | Admitting: Physical Therapy

## 2016-11-08 DIAGNOSIS — R278 Other lack of coordination: Secondary | ICD-10-CM

## 2016-11-08 DIAGNOSIS — R29898 Other symptoms and signs involving the musculoskeletal system: Secondary | ICD-10-CM

## 2016-11-08 DIAGNOSIS — R2689 Other abnormalities of gait and mobility: Secondary | ICD-10-CM

## 2016-11-08 DIAGNOSIS — M6281 Muscle weakness (generalized): Secondary | ICD-10-CM | POA: Diagnosis not present

## 2016-11-08 NOTE — Therapy (Addendum)
[facility] [address] [place], [place], [address] Phone: [phone]   Fax:  [phone]  Physical Therapy Treatment  Patient Details  Name: Adrienne [name] MRN: [id number] Date of Birth: 04/03/1928 Referring Provider: Delrae Rend, MD  Encounter Date: 11/08/2016      PT End of Session - 11/08/16 0926    Visit Number 11   Number of Visits 20   Date for PT Re-Evaluation Dec 19, 2016   Authorization Type g codes at 2022-12-19 visit   PT Start Time 0927   PT Stop Time 0956   PT Time Calculation (min) 29 min   Activity Tolerance Patient tolerated treatment well   Behavior During Therapy [facility] for tasks assessed/performed      Past Medical History:  Diagnosis Date  . Afib ([place])   . Arthritis of ankle   . CAD (coronary artery disease)   . Cancer [facility]) 2012   breast cancer-right  . Compression fracture of T12 vertebra (HCC)   . Diverticulosis   . Edema   . Elevated uric acid in blood   . GI bleed 12/2015  . Hypertension   . Long-term (current) use of anticoagulants   . Moderate mitral regurgitation   . Pulmonary HTN   . Stroke ([place]) 01/25/2016  . TIA (transient ischemic attack) 09/2011  . Urinary incontinence     Past Surgical History:  Procedure Laterality Date  . APPENDECTOMY    . BREAST SURGERY  2012   Rt.mastectomy--[place]  2013  . CATARACT EXTRACTION, BILATERAL    . DILATION AND CURETTAGE OF UTERUS     in her early 41's for DUB  . ESOPHAGOGASTRODUODENOSCOPY Left 01/08/2016   Procedure: ESOPHAGOGASTRODUODENOSCOPY (EGD);  Surgeon: Arta Silence, MD;  Location: [facility];  Service: Endoscopy;  Laterality: Left;  . GALLBLADDER SURGERY    . MASTECTOMY Right   . RADIOLOGY WITH ANESTHESIA N/A 01/25/2016   Procedure: RADIOLOGY WITH ANESTHESIA;  Surgeon: Luanne Bras, MD;  Location: [employer];  Service: Radiology;  Laterality: N/A;  . REPLACEMENT TOTAL KNEE BILATERAL    .  TONSILLECTOMY AND ADENOIDECTOMY     as a child    There were no vitals filed for this visit.      Subjective Assessment - 11/08/16 0930    Subjective Back is giving me a fit today.  Was just there when I tried to get out of bed.     Pertinent History osteoporosis, T12 fracture, history of CA   Patient Stated Goals feel strong in my back and not have any pain   Currently in Pain? Yes   Pain Score 7    Pain Location Back   Pain Orientation Mid;Lower   Pain Descriptors / Indicators Aching   Pain Type Acute pain   Pain Onset More than a month ago   Pain Frequency Intermittent   Aggravating Factors  movement when getting up and down   Pain Relieving Factors goes away once up   Effect of Pain on Daily Activities none   Multiple Pain Sites No                         [facility] Adult PT Treatment/Exercise - 11/08/16 0001      Therapeutic Activites    Therapeutic Activities Lifting;ADL's   ADL's sit to stand and transfers with core activation and increased extension  8x     Lumbar Exercises: Supine   Bent Knee Raise  10 reps;2 seconds  1 pillow under back for thoracic extension; 2 for head   Bridge 20 reps  with red band for abduction   Bridge Limitations bridge with LE on bolster 10x BLE; 5x single leg     Knee/Hip Exercises: Aerobic   Nustep L2 x 6 min  PT present educating patient on plan of care     Shoulder Exercises: Seated   Horizontal ABduction Strengthening;Both;20 reps;Theraband   Theraband Level (Shoulder Horizontal ABduction) Level 1 (Yellow)   Abduction Strengthening;Both;20 reps  cues for using core and maintain posture   Other Seated Exercises seated scapula squeezes - 10x 1 sec hold, 10x 5sec hold  cues for improved posture                  PT Short Term Goals - 11/08/16 0957      PT SHORT TERM GOAL #3   Title Pain reduced by 50% when getting out of recliner   Baseline ...   Time 4   Period Weeks   Status On-going     PT SHORT  TERM GOAL #4   Baseline ...           PT Long Term Goals - 11/03/16 1413      PT LONG TERM GOAL #1   Title independent with advanced HEP   Time 8   Period Weeks   Status On-going     PT LONG TERM GOAL #2   Title Pt will improve BERG score to >/=51/56 to decr. falls risk   Time 8   Period Weeks   Status On-going     PT LONG TERM GOAL #3   Title Increased height to 158 cm on scale to demonstrate improved posture   Time 8   Period Weeks   Status On-going     PT LONG TERM GOAL #4   Title TUG < or = to 13 sec for reduced fall risk    Baseline 13   Time 8   Period Weeks   Status Achieved               Plan - 11/08/16 [id number]  Clinical impression: Patient continues to demonstrate progress with strength and extension exercises.  She was a little more painful today.   Continues to need cues for exercises in order to perform correctly and demonstrates bilateral hip weakness which is limiting stability during functional movements.  Skilled PT needed for strength and maximum functional activities.   Rehab Potential Excellent   Clinical Impairments Affecting Rehab Potential osteoporosis, LE edema, A-fib   PT Treatment/Interventions ADLs/Self Care Home Management;Cryotherapy;Moist Heat;Electrical Stimulation;Gait training;Stair training;Functional mobility training;Therapeutic activities;Therapeutic exercise;Balance training;Neuromuscular re-education;Patient/family education;Manual techniques      Patient will benefit from skilled therapeutic intervention in order to improve the following deficits and impairments:  Abnormal gait, Decreased activity tolerance, Decreased balance, Decreased endurance, Decreased range of motion, Decreased strength, Postural dysfunction, Pain, Increased edema  Visit Diagnosis: Other abnormalities of gait and mobility  Muscle weakness (generalized)  Weakness of both lower extremities  Other lack of coordination     Problem List Patient  Active Problem List   Diagnosis Date Noted  . Diplopia 01/29/2016  . GERD (gastroesophageal reflux disease) 01/29/2016  . Cerebrovascular accident (CVA) due to embolism of left middle cerebral artery ([place]) 01/29/2016  . Persistent atrial fibrillation ([place])   . Bleeding gastrointestinal   . Gait disturbance, post-stroke 01/27/2016  . Fall   . Secondary hypertension, unspecified   . Cerebral  infarction due to thrombosis of left middle cerebral artery (HCC) s/p mechanical thrombectomy   . Malnutrition of moderate degree 01/09/2016  . GI bleed 01/06/2016  . Chronic atrial fibrillation ([place]) 06/10/2015  . Pulmonary hypertension, secondary ([place]) 06/10/2015  . Chronic anticoagulation 06/10/2015  . HLD (hyperlipidemia) 06/10/2015  . Essential hypertension 06/10/2015  . History of stroke 06/10/2015  . Coronary artery disease due to lipid rich plaque 06/10/2015  . Afib ([place])   . Elevated uric acid in blood   . Arthritis of ankle   . Pulmonary HTN     Zannie Cove, PT 11/08/2016, 9:58 AM  [facility] [address] [place], [place], [address] Phone: [phone]   Fax:  [phone]  Name: Estrellita Levert MRN: [id number] Date of Birth: 06-06-1928

## 2016-11-10 ENCOUNTER — Encounter: Payer: Medicare Other | Admitting: Physical Therapy

## 2016-11-15 ENCOUNTER — Encounter: Payer: Self-pay | Admitting: Physical Therapy

## 2016-11-15 ENCOUNTER — Ambulatory Visit: Payer: Medicare Other | Attending: Internal Medicine | Admitting: Physical Therapy

## 2016-11-15 DIAGNOSIS — R2689 Other abnormalities of gait and mobility: Secondary | ICD-10-CM | POA: Diagnosis not present

## 2016-11-15 DIAGNOSIS — M6281 Muscle weakness (generalized): Secondary | ICD-10-CM

## 2016-11-15 DIAGNOSIS — R29898 Other symptoms and signs involving the musculoskeletal system: Secondary | ICD-10-CM | POA: Diagnosis not present

## 2016-11-15 DIAGNOSIS — R278 Other lack of coordination: Secondary | ICD-10-CM | POA: Insufficient documentation

## 2016-11-15 NOTE — Patient Instructions (Signed)
Pectoral Stretch, Supine on Foam Roller    Lie on back pillow under upper back and allow arms to spread wide, keeping elbows bent 90. To increase stretch, reach arms over head. Hold _10__ seconds. Repeat __5_ times per session. Do _1__ sessions per day.  Copyright  VHI. All rights reserved.    [facility] [place] [address], [place], [place] [address] Phone # [phone] Fax [phone]

## 2016-11-15 NOTE — Therapy (Signed)
[facility] [address] [place], [place], [address] Phone: [phone]   Fax:  [phone]  Physical Therapy Treatment  Patient Details  Name: Adrienne [name] MRN: [id number] Date of Birth: Jan 23, 1928 Referring Provider: Delrae Rend, MD  Encounter Date: 11/15/2016      PT End of Session - 11/15/16 1410    Visit Number 12   Number of Visits 20   Authorization Type g codes at 20th visit   PT Start Time 1401   PT Stop Time 1447   PT Time Calculation (min) 46 min   Activity Tolerance Patient tolerated treatment well   Behavior During Therapy [facility] for tasks assessed/performed      Past Medical History:  Diagnosis Date  . Afib ([place])   . Arthritis of ankle   . CAD (coronary artery disease)   . Cancer [facility]) 2012   breast cancer-right  . Compression fracture of T12 vertebra (HCC)   . Diverticulosis   . Edema   . Elevated uric acid in blood   . GI bleed 12/2015  . Hypertension   . Long-term (current) use of anticoagulants   . Moderate mitral regurgitation   . Pulmonary HTN   . Stroke ([place]) 01/25/2016  . TIA (transient ischemic attack) 09/2011  . Urinary incontinence     Past Surgical History:  Procedure Laterality Date  . APPENDECTOMY    . BREAST SURGERY  2012   Rt.mastectomy--[place]  2013  . CATARACT EXTRACTION, BILATERAL    . DILATION AND CURETTAGE OF UTERUS     in her early 88's for DUB  . ESOPHAGOGASTRODUODENOSCOPY Left 01/08/2016   Procedure: ESOPHAGOGASTRODUODENOSCOPY (EGD);  Surgeon: Arta Silence, MD;  Location: [facility];  Service: Endoscopy;  Laterality: Left;  . GALLBLADDER SURGERY    . MASTECTOMY Right   . RADIOLOGY WITH ANESTHESIA N/A 01/25/2016   Procedure: RADIOLOGY WITH ANESTHESIA;  Surgeon: Luanne Bras, MD;  Location: [facility];  Service: Radiology;  Laterality: N/A;  . REPLACEMENT TOTAL KNEE BILATERAL    . TONSILLECTOMY AND ADENOIDECTOMY     as  a child    There were no vitals filed for this visit.          [facility] PT Assessment - 11/15/16 0001      Berg Balance Test   Sit to Stand Able to stand without using hands and stabilize independently   Standing Unsupported Able to stand safely 2 minutes   Sitting with Back Unsupported but Feet Supported on Floor or Stool Able to sit safely and securely 2 minutes   Stand to Sit Sits safely with minimal use of hands   Transfers Able to transfer safely, minor use of hands   Standing Unsupported with Eyes Closed Able to stand 10 seconds safely   Standing Ubsupported with Feet Together Able to place feet together independently and stand 1 minute safely   From Standing, Reach Forward with Outstretched Arm Can reach forward >12 cm safely (5")  7.5"   From Standing Position, Pick up Object from Floor Able to pick up shoe safely and easily   From Standing Position, Turn to Look Behind Over each Shoulder Looks behind from both sides and weight shifts well   Turn 360 Degrees Able to turn 360 degrees safely one side only in 4 seconds or less   Standing Unsupported, Alternately Place Feet on Step/Stool Able to stand independently and safely and complete 8 steps in 20 seconds   Standing  Unsupported, One Foot in [place] to plae foot ahead of the other independently and hold 30 seconds   Standing on One Leg Tries to lift leg/unable to hold 3 seconds but remains standing independently   Total Score 50                     [facility] Adult PT Treatment/Exercise - 11/15/16 0001      Posture/Postural Control   Posture Comments 156 cm     Neuro Re-ed    Neuro Re-ed Details  diaphragmatic breathing in standing with increased thoracic extension in the inhale     Neck Exercises: Supine   Shoulder ABduction 20 reps  red band - pillow under thoracic for extension   Upper Extremity D2 Extension;Theraband;20 reps  pillow under thoracic spine for increased extension   Theraband Level (UE D2)  Level 2 (Red)     Lumbar Exercises: Sidelying   Other Sidelying Lumbar Exercises side reach with UE 15x each for thoracic side bend                PT Education - 11/15/16 1450    Education provided Yes   Education Details pec stretch in supine - open book stretch   Person(s) Educated Patient   Methods Explanation;Demonstration;Verbal cues;Handout   Comprehension Verbalized understanding;Returned demonstration          PT Short Term Goals - 11/15/16 1416      PT SHORT TERM GOAL #2   Title Pt will improve BERG score to >/=47/56 to decr. falls risk.   Baseline 50/56   Time 4   Period Weeks   Status Achieved     PT SHORT TERM GOAL #3   Title Pain reduced by 50% when getting out of recliner   Time 4   Period Weeks   Status On-going     PT SHORT TERM GOAL #4   Title TUG < or = to 14 sec for decreased fall risk   Time 4   Period Weeks   Status Achieved           PT Long Term Goals - 11/15/16 1418      PT LONG TERM GOAL #1   Title independent with advanced HEP   Time 8   Period Weeks   Status On-going     PT LONG TERM GOAL #2   Title Pt will improve BERG score to >/=51/56 to decr. falls risk   Time 8   Period Weeks   Status On-going     PT LONG TERM GOAL #3   Title Increased height to 158 cm on scale to demonstrate improved posture   Baseline 156 cm   Time 8   Period Weeks   Status On-going               Plan - 11/15/16 1522    Clinical Impression Statement Patient has made progress with Merrilee Jansky and met short term goal obtaining 50/56.  Patient continues to have pain that comes and goes but feels better with increased thoracic extension exercies.  She did well with breathing exercises in standing and demonstrated increased extension on inhale.  Patient needs skilled PT for postural strength and increased thoracic extension to allow for compression fracture to heal and avoid risk of future injury.   Rehab Potential Excellent   Clinical  Impairments Affecting Rehab Potential osteoporosis, LE edema, A-fib   PT Treatment/Interventions ADLs/Self Care Home Management;Cryotherapy;Moist Heat;Electrical Stimulation;Gait training;Stair training;Functional mobility training;Therapeutic  activities;Therapeutic exercise;Balance training;Neuromuscular re-education;Patient/family education;Manual techniques   PT Next Visit Plan progress extension ROM, LE strength and balance   PT Home Exercise Plan Progress as needed   Consulted and Agree with Plan of Care Patient      Patient will benefit from skilled therapeutic intervention in order to improve the following deficits and impairments:  Abnormal gait, Decreased activity tolerance, Decreased balance, Decreased endurance, Decreased range of motion, Decreased strength, Postural dysfunction, Pain, Increased edema  Visit Diagnosis: Other abnormalities of gait and mobility  Muscle weakness (generalized)  Weakness of both lower extremities     Problem List Patient Active Problem List   Diagnosis Date Noted  . Diplopia 01/29/2016  . GERD (gastroesophageal reflux disease) 01/29/2016  . Cerebrovascular accident (CVA) due to embolism of left middle cerebral artery ([place]) 01/29/2016  . Persistent atrial fibrillation ([place])   . Bleeding gastrointestinal   . Gait disturbance, post-stroke 01/27/2016  . Fall   . Secondary hypertension, unspecified   . Cerebral infarction due to thrombosis of left middle cerebral artery (HCC) s/p mechanical thrombectomy   . Malnutrition of moderate degree 01/09/2016  . GI bleed 01/06/2016  . Chronic atrial fibrillation ([place]) 06/10/2015  . Pulmonary hypertension, secondary ([place]) 06/10/2015  . Chronic anticoagulation 06/10/2015  . HLD (hyperlipidemia) 06/10/2015  . Essential hypertension 06/10/2015  . History of stroke 06/10/2015  . Coronary artery disease due to lipid rich plaque 06/10/2015  . Afib ([place])   . Elevated uric acid in blood   . Arthritis of  ankle   . Pulmonary HTN     Zannie Cove, PT 11/15/2016, 3:33 PM  [facility] [address], [place], [place], [address] Phone: [phone]   Fax:  [phone]  Name: Keirstan Iannello MRN: [id number] Date of Birth: 22-Jun-1928

## 2016-11-17 ENCOUNTER — Encounter: Payer: Self-pay | Admitting: Physical Therapy

## 2016-11-17 ENCOUNTER — Ambulatory Visit: Payer: Medicare Other | Admitting: Physical Therapy

## 2016-11-17 DIAGNOSIS — M6281 Muscle weakness (generalized): Secondary | ICD-10-CM | POA: Diagnosis not present

## 2016-11-17 DIAGNOSIS — R278 Other lack of coordination: Secondary | ICD-10-CM | POA: Diagnosis not present

## 2016-11-17 DIAGNOSIS — R29898 Other symptoms and signs involving the musculoskeletal system: Secondary | ICD-10-CM | POA: Diagnosis not present

## 2016-11-17 DIAGNOSIS — R2689 Other abnormalities of gait and mobility: Secondary | ICD-10-CM | POA: Diagnosis not present

## 2016-11-17 NOTE — Therapy (Signed)
[facility] [address] [place], [place], [address] Phone: [phone]   Fax:  [phone]  Physical Therapy Treatment  Patient Details  Name: Adrienne [name] MRN: [id number] Date of Birth: August 13, 1928 Referring Provider: Delrae Rend, MD  Encounter Date: 11/17/2016      PT End of Session - 11/17/16 1406    Visit Number 13   Number of Visits 20   Date for PT Re-Evaluation 09-Dec-2016   Authorization Type g codes at 2022/12/09 visit   PT Start Time 1403   PT Stop Time 1445   PT Time Calculation (min) 42 min   Activity Tolerance Patient tolerated treatment well   Behavior During Therapy [facility] for tasks assessed/performed      Past Medical History:  Diagnosis Date  . Afib ([place])   . Arthritis of ankle   . CAD (coronary artery disease)   . Cancer [facility]) 2012   breast cancer-right  . Compression fracture of T12 vertebra (HCC)   . Diverticulosis   . Edema   . Elevated uric acid in blood   . GI bleed 12/2015  . Hypertension   . Long-term (current) use of anticoagulants   . Moderate mitral regurgitation   . Pulmonary HTN   . Stroke ([place]) 01/25/2016  . TIA (transient ischemic attack) 09/2011  . Urinary incontinence     Past Surgical History:  Procedure Laterality Date  . APPENDECTOMY    . BREAST SURGERY  2012   Rt.mastectomy--[place], [place]  2013  . CATARACT EXTRACTION, BILATERAL    . DILATION AND CURETTAGE OF UTERUS     in her early 35's for DUB  . ESOPHAGOGASTRODUODENOSCOPY Left 01/08/2016   Procedure: ESOPHAGOGASTRODUODENOSCOPY (EGD);  Surgeon: Arta Silence, MD;  Location: [facility];  Service: Endoscopy;  Laterality: Left;  . GALLBLADDER SURGERY    . MASTECTOMY Right   . RADIOLOGY WITH ANESTHESIA N/A 01/25/2016   Procedure: RADIOLOGY WITH ANESTHESIA;  Surgeon: Luanne Bras, MD;  Location: [facility];  Service: Radiology;  Laterality: N/A;  . REPLACEMENT TOTAL KNEE BILATERAL    .  TONSILLECTOMY AND ADENOIDECTOMY     as a child    There were no vitals filed for this visit.      Subjective Assessment - 11/17/16 1407    Subjective Back is feeling okay today. Denies pain currently.   Pertinent History osteoporosis, T12 fracture, history of CA   Patient Stated Goals feel strong in my back and not have any pain   Currently in Pain? No/denies                         [facility] Adult PT Treatment/Exercise - 11/17/16 0001      Neck Exercises: Supine   Shoulder ABduction 20 reps  red band - pillow under thoracic for extension   Upper Extremity D2 Extension;Theraband;20 reps  pillow under thoracic spine for increased extension   Theraband Level (UE D2) Level 2 (Red)   Other Supine Exercise bilateral external rotation - red band with pillow for increased thoracic extension     Lumbar Exercises: Machines for Strengthening   Leg Press bilateral 60# 2x10  seat 6     Lumbar Exercises: Sidelying   Other Sidelying Lumbar Exercises side reach with UE 15x each for thoracic side bend     Knee/Hip Exercises: Aerobic   Nustep L2 x 8 min     Knee/Hip Exercises: Seated   Sit to [employer]  10 reps;with UE support  red band and standing on 2" step for greater glute contracti     Knee/Hip Exercises: Supine   Bridges with Clamshell Strengthening;Both;15 reps  red band   Other Supine Knee/Hip Exercises hip abduction in hook lying - red band - 15 x each side   Other Supine Knee/Hip Exercises hip abduction with abdominal bracing; LE marching with ab bracing - 20x each                  PT Short Term Goals - 11/15/16 1416      PT SHORT TERM GOAL #2   Title Pt will improve BERG score to >/=47/56 to decr. falls risk.   Baseline 50/56   Time 4   Period Weeks   Status Achieved     PT SHORT TERM GOAL #3   Title Pain reduced by 50% when getting out of recliner   Time 4   Period Weeks   Status On-going     PT SHORT TERM GOAL #4   Title TUG < or = to 14  sec for decreased fall risk   Time 4   Period Weeks   Status Achieved           PT Long Term Goals - 11/15/16 1418      PT LONG TERM GOAL #1   Title independent with advanced HEP   Time 8   Period Weeks   Status On-going     PT LONG TERM GOAL #2   Title Pt will improve BERG score to >/=51/56 to decr. falls risk   Time 8   Period Weeks   Status On-going     PT LONG TERM GOAL #3   Title Increased height to 158 cm on scale to demonstrate improved posture   Baseline 156 cm   Time 8   Period Weeks   Status On-going               Plan - 11/17/16 1448    Clinical Impression Statement Patient was able to progress to leg press and did well with more difficulty onsit to stand.  Needs tactile, verbal and visual feedback for improved form and decreased genuvalgus on sit to stand.  Patient has some shoulder pain Rt UE with overhead in sidelying.  demonstrates weakness in glutes and core.  Skilled PT needed fo rincreased strength and reduced pain for return to function.    Rehab Potential Excellent   Clinical Impairments Affecting Rehab Potential osteoporosis, LE edema, A-fib   PT Treatment/Interventions ADLs/Self Care Home Management;Cryotherapy;Moist Heat;Electrical Stimulation;Gait training;Stair training;Functional mobility training;Therapeutic activities;Therapeutic exercise;Balance training;Neuromuscular re-education;Patient/family education;Manual techniques   PT Next Visit Plan progress extension ROM, LE strength and balance   Consulted and Agree with Plan of Care Patient      Patient will benefit from skilled therapeutic intervention in order to improve the following deficits and impairments:  Abnormal gait, Decreased activity tolerance, Decreased balance, Decreased endurance, Decreased range of motion, Decreased strength, Postural dysfunction, Pain, Increased edema  Visit Diagnosis: Other abnormalities of gait and mobility  Muscle weakness  (generalized)     Problem List Patient Active Problem List   Diagnosis Date Noted  . Diplopia 01/29/2016  . GERD (gastroesophageal reflux disease) 01/29/2016  . Cerebrovascular accident (CVA) due to embolism of left middle cerebral artery ([place]) 01/29/2016  . Persistent atrial fibrillation ([place])   . Bleeding gastrointestinal   . Gait disturbance, post-stroke 01/27/2016  . Fall   . Secondary hypertension, unspecified   .  Cerebral infarction due to thrombosis of left middle cerebral artery (HCC) s/p mechanical thrombectomy   . Malnutrition of moderate degree 01/09/2016  . GI bleed 01/06/2016  . Chronic atrial fibrillation ([place]) 06/10/2015  . Pulmonary hypertension, secondary ([place]) 06/10/2015  . Chronic anticoagulation 06/10/2015  . HLD (hyperlipidemia) 06/10/2015  . Essential hypertension 06/10/2015  . History of stroke 06/10/2015  . Coronary artery disease due to lipid rich plaque 06/10/2015  . Afib ([place])   . Elevated uric acid in blood   . Arthritis of ankle   . Pulmonary HTN     Zannie Cove, PT 11/17/2016, 2:51 PM  [facility] [address] [place], [place], [address] Phone: [phone]   Fax:  [phone]  Name: Adrienne [name] MRN: [id number] Date of Birth: 11/03/1927

## 2016-11-22 ENCOUNTER — Ambulatory Visit: Payer: Medicare Other | Admitting: Physical Therapy

## 2016-11-22 ENCOUNTER — Encounter: Payer: Self-pay | Admitting: Physical Therapy

## 2016-11-22 DIAGNOSIS — R2689 Other abnormalities of gait and mobility: Secondary | ICD-10-CM

## 2016-11-22 DIAGNOSIS — R29898 Other symptoms and signs involving the musculoskeletal system: Secondary | ICD-10-CM

## 2016-11-22 DIAGNOSIS — R278 Other lack of coordination: Secondary | ICD-10-CM | POA: Diagnosis not present

## 2016-11-22 DIAGNOSIS — M6281 Muscle weakness (generalized): Secondary | ICD-10-CM | POA: Diagnosis not present

## 2016-11-22 NOTE — Therapy (Signed)
[facility] [address] [place], [place], [address] Phone: [phone]   Fax:  [phone]  Physical Therapy Treatment  Patient Details  Name: Adrienne [name] MRN: [id number] Date of Birth: [birth year] Referring Provider: Delrae Rend, MD  Encounter Date: 11/22/2016      PT End of Session - 11/22/16 1407    Visit Number 14   Number of Visits 20   Date for PT Re-Evaluation 12/03/2016   Authorization Type g codes at 2022-12-03 visit   PT Start Time 1403   PT Stop Time 1445   PT Time Calculation (min) 42 min   Activity Tolerance Patient tolerated treatment well   Behavior During Therapy [facility] for tasks assessed/performed      Past Medical History:  Diagnosis Date  . Afib ([place])   . Arthritis of ankle   . CAD (coronary artery disease)   . Cancer [facility]) 2012   breast cancer-right  . Compression fracture of T12 vertebra (HCC)   . Diverticulosis   . Edema   . Elevated uric acid in blood   . GI bleed 12/2015  . Hypertension   . Long-term (current) use of anticoagulants   . Moderate mitral regurgitation   . Pulmonary HTN   . Stroke ([place]) 01/25/2016  . TIA (transient ischemic attack) 09/2011  . Urinary incontinence     Past Surgical History:  Procedure Laterality Date  . APPENDECTOMY    . BREAST SURGERY  2012   Rt.mastectomy--[place]  2013  . CATARACT EXTRACTION, BILATERAL    . DILATION AND CURETTAGE OF UTERUS     in her early 53's for DUB  . ESOPHAGOGASTRODUODENOSCOPY Left 01/08/2016   Procedure: ESOPHAGOGASTRODUODENOSCOPY (EGD);  Surgeon: Arta Silence, MD;  Location: [facility];  Service: Endoscopy;  Laterality: Left;  . GALLBLADDER SURGERY    . MASTECTOMY Right   . RADIOLOGY WITH ANESTHESIA N/A 01/25/2016   Procedure: RADIOLOGY WITH ANESTHESIA;  Surgeon: Luanne Bras, MD;  Location: [facility];  Service: Radiology;  Laterality: N/A;  . REPLACEMENT TOTAL KNEE BILATERAL    .  TONSILLECTOMY AND ADENOIDECTOMY     as a child    There were no vitals filed for this visit.      Subjective Assessment - 11/22/16 1407    Subjective Feeling pretty good today   Pertinent History osteoporosis, T12 fracture, history of CA   Patient Stated Goals feel strong in my back and not have any pain   Currently in Pain? No/denies                         [facility] Adult PT Treatment/Exercise - 11/22/16 0001      Neck Exercises: Supine   Shoulder ABduction 20 reps  red band - pillow under thoracic for extension   Upper Extremity D2 Extension;Theraband;20 reps  pillow under thoracic spine for increased extension   Theraband Level (UE D2) Level 2 (Red)   Other Supine Exercise bilateral external rotation - red band with pillow for increased thoracic extension     Lumbar Exercises: Machines for Strengthening   Leg Press bilateral 65# 2x10  seat 6     Lumbar Exercises: Standing   Heel Raises 20 reps     Lumbar Exercises: Supine   Bent Knee Raise 10 reps;2 seconds  1 pillow under back for thoracic extension; 2 for head   Bridge 20 reps  with red band for abduction   Bridge  Limitations bridge with LE on bolster 10x BLE; 5x single leg   Other Supine Lumbar Exercises ball squeeze 5 sec hold x 10     Knee/Hip Exercises: Aerobic   Nustep L1 x 8 min; L2x 2 min     Knee/Hip Exercises: Seated   Sit to Sand with UE support;20 reps     Knee/Hip Exercises: Supine   Other Supine Knee/Hip Exercises hip abduction in hook lying - red band - 20 x each side   Other Supine Knee/Hip Exercises hip abduction with abdominal bracing; LE marching with ab bracing - 20x each     Shoulder Exercises: Standing   ABduction Strengthening;Both;15 reps;Theraband   Theraband Level (Shoulder ABduction) Level 2 (Red)   Extension Strengthening;Both;20 reps;Theraband   Theraband Level (Shoulder Extension) Level 2 (Red)   Row Strengthening;Both;20 reps;Theraband   Theraband Level (Shoulder  Row) Level 2 (Red)                  PT Short Term Goals - 11/15/16 1416      PT SHORT TERM GOAL #2   Title Pt will improve BERG score to >/=47/56 to decr. falls risk.   Baseline 50/56   Time 4   Period Weeks   Status Achieved     PT SHORT TERM GOAL #3   Title Pain reduced by 50% when getting out of recliner   Time 4   Period Weeks   Status On-going     PT SHORT TERM GOAL #4   Title TUG < or = to 14 sec for decreased fall risk   Time 4   Period Weeks   Status Achieved           PT Long Term Goals - 11/22/16 1441      PT LONG TERM GOAL #1   Title independent with advanced HEP   Baseline added to HEP today   Time 8   Period Weeks   Status On-going               Plan - 11/22/16 1437    Clinical Impression Statement Patient did well with exercises.  Continues to need cues for posture and core stability.  Patient will benefit from skilled PT to transition to advanced HEP for improved strength and reduced risk of inury.   Rehab Potential Excellent   Clinical Impairments Affecting Rehab Potential osteoporosis, LE edema, A-fib   PT Treatment/Interventions ADLs/Self Care Home Management;Cryotherapy;Moist Heat;Electrical Stimulation;Gait training;Stair training;Functional mobility training;Therapeutic activities;Therapeutic exercise;Balance training;Neuromuscular re-education;Patient/family education;Manual techniques   PT Next Visit Plan progress extension ROM, LE strength and balance   Consulted and Agree with Plan of Care Patient      Patient will benefit from skilled therapeutic intervention in order to improve the following deficits and impairments:  Abnormal gait, Decreased activity tolerance, Decreased balance, Decreased endurance, Decreased range of motion, Decreased strength, Postural dysfunction, Pain, Increased edema  Visit Diagnosis: Other abnormalities of gait and mobility  Muscle weakness (generalized)  Weakness of both lower  extremities  Other lack of coordination     Problem List Patient Active Problem List   Diagnosis Date Noted  . Diplopia 01/29/2016  . GERD (gastroesophageal reflux disease) 01/29/2016  . Cerebrovascular accident (CVA) due to embolism of left middle cerebral artery ([place]) 01/29/2016  . Persistent atrial fibrillation ([place])   . Bleeding gastrointestinal   . Gait disturbance, post-stroke 01/27/2016  . Fall   . Secondary hypertension, unspecified   . Cerebral infarction due to thrombosis of left  middle cerebral artery (HCC) s/p mechanical thrombectomy   . Malnutrition of moderate degree 01/09/2016  . GI bleed 01/06/2016  . Chronic atrial fibrillation ([place]) 06/10/2015  . Pulmonary hypertension, secondary ([place]) 06/10/2015  . Chronic anticoagulation 06/10/2015  . HLD (hyperlipidemia) 06/10/2015  . Essential hypertension 06/10/2015  . History of stroke 06/10/2015  . Coronary artery disease due to lipid rich plaque 06/10/2015  . Afib ([place])   . Elevated uric acid in blood   . Arthritis of ankle   . Pulmonary HTN     Wayne Both 11/22/2016, 2:48 PM  [facility] [address] [place], [place], [address] Phone: [phone]   Fax:  [phone]  Name: Adrienne [name] MRN: [id number] Date of Birth: [birth year]

## 2016-11-24 ENCOUNTER — Ambulatory Visit: Payer: Medicare Other | Admitting: Physical Therapy

## 2016-11-24 DIAGNOSIS — R2689 Other abnormalities of gait and mobility: Secondary | ICD-10-CM | POA: Diagnosis not present

## 2016-11-24 DIAGNOSIS — M6281 Muscle weakness (generalized): Secondary | ICD-10-CM

## 2016-11-24 DIAGNOSIS — R29898 Other symptoms and signs involving the musculoskeletal system: Secondary | ICD-10-CM | POA: Diagnosis not present

## 2016-11-24 DIAGNOSIS — R278 Other lack of coordination: Secondary | ICD-10-CM | POA: Diagnosis not present

## 2016-11-24 NOTE — Therapy (Signed)
[facility] [address] [place], [place], [address] Phone: [phone]   Fax:  [phone]  Physical Therapy Treatment  Patient Details  Name: Adrienne [name] MRN: [id number] Date of Birth: 1927/11/03 Referring Provider: Delrae Rend, MD  Encounter Date: 11/24/2016      PT End of Session - 11/24/16 1412    Visit Number 15   Number of Visits 20   Date for PT Re-Evaluation Dec 13, 2016   Authorization Type g codes at 12-13-2022 visit; use KX   PT Start Time 1402   PT Stop Time 1442   PT Time Calculation (min) 40 min   Activity Tolerance Patient tolerated treatment well   Behavior During Therapy [facility] for tasks assessed/performed      Past Medical History:  Diagnosis Date  . Afib ([place])   . Arthritis of ankle   . CAD (coronary artery disease)   . Cancer [facility]) 2012   breast cancer-right  . Compression fracture of T12 vertebra (HCC)   . Diverticulosis   . Edema   . Elevated uric acid in blood   . GI bleed 12/2015  . Hypertension   . Long-term (current) use of anticoagulants   . Moderate mitral regurgitation   . Pulmonary HTN   . Stroke ([place]) 01/25/2016  . TIA (transient ischemic attack) 09/2011  . Urinary incontinence     Past Surgical History:  Procedure Laterality Date  . APPENDECTOMY    . BREAST SURGERY  2012   Rt.mastectomy--[place]  2013  . CATARACT EXTRACTION, BILATERAL    . DILATION AND CURETTAGE OF UTERUS     in her early 89's for DUB  . ESOPHAGOGASTRODUODENOSCOPY Left 01/08/2016   Procedure: ESOPHAGOGASTRODUODENOSCOPY (EGD);  Surgeon: Arta Silence, MD;  Location: [facility];  Service: Endoscopy;  Laterality: Left;  . GALLBLADDER SURGERY    . MASTECTOMY Right   . RADIOLOGY WITH ANESTHESIA N/A 01/25/2016   Procedure: RADIOLOGY WITH ANESTHESIA;  Surgeon: Luanne Bras, MD;  Location: [place];  Service: Radiology;  Laterality: N/A;  . REPLACEMENT TOTAL KNEE BILATERAL     . TONSILLECTOMY AND ADENOIDECTOMY     as a child    There were no vitals filed for this visit.      Subjective Assessment - 11/24/16 1410    Subjective Feeling good today, no pain with sit to stand   Pertinent History osteoporosis, T12 fracture, history of CA   Patient Stated Goals feel strong in my back and not have any pain   Currently in Pain? No/denies                         [facility] Adult PT Treatment/Exercise - 11/24/16 0001      Neck Exercises: Supine   Shoulder ABduction 20 reps  red band - pillow under thoracic for extension   Other Supine Exercise marching for core stabilization, UE alternating with 1lb weight for core stabilization     Lumbar Exercises: Standing   Other Standing Lumbar Exercises shoulder flexion with abdominal bracing and breathing - I, Y, T - 1lb - 10x each way   Other Standing Lumbar Exercises hip extension with red band - 10x each side     Knee/Hip Exercises: Aerobic   Nustep L2x 8 min     Knee/Hip Exercises: Seated   Sit to Sand 20 reps;without UE support  sitting on     Shoulder Exercises: Standing   Extension Strengthening;Both;20 reps;Weights  Extension Weight (lbs) 15   Row Strengthening;Both;20 reps   Row Weight (lbs) 15                  PT Short Term Goals - 11/24/16 1413      PT SHORT TERM GOAL #3   Title Pain reduced by 50% when getting out of recliner   Baseline 30%   Time 4   Period Weeks   Status On-going           PT Long Term Goals - 11/22/16 1441      PT LONG TERM GOAL #1   Title independent with advanced HEP   Baseline added to HEP today   Time 8   Period Weeks   Status On-going               Plan - 11/24/16 1412    Clinical Impression Statement Performed functional activities such as sit to stnad and lifting with abdominal bracing.  Posture and extension emphasized during exercises for improved posture.  Pt will benefit from skilled PT for improved strength and  posture for return to maximum function with reduced pain.   Rehab Potential Excellent   Clinical Impairments Affecting Rehab Potential osteoporosis, LE edema, A-fib   PT Treatment/Interventions ADLs/Self Care Home Management;Cryotherapy;Moist Heat;Electrical Stimulation;Gait training;Stair training;Functional mobility training;Therapeutic activities;Therapeutic exercise;Balance training;Neuromuscular re-education;Patient/family education;Manual techniques   PT Next Visit Plan progress extension ROM, LE strength and balance   Consulted and Agree with Plan of Care Patient      Patient will benefit from skilled therapeutic intervention in order to improve the following deficits and impairments:  Abnormal gait, Decreased activity tolerance, Decreased balance, Decreased endurance, Decreased range of motion, Decreased strength, Postural dysfunction, Pain, Increased edema  Visit Diagnosis: Other abnormalities of gait and mobility  Muscle weakness (generalized)     Problem List Patient Active Problem List   Diagnosis Date Noted  . Diplopia 01/29/2016  . GERD (gastroesophageal reflux disease) 01/29/2016  . Cerebrovascular accident (CVA) due to embolism of left middle cerebral artery ([place]) 01/29/2016  . Persistent atrial fibrillation ([place])   . Bleeding gastrointestinal   . Gait disturbance, post-stroke 01/27/2016  . Fall   . Secondary hypertension, unspecified   . Cerebral infarction due to thrombosis of left middle cerebral artery (HCC) s/p mechanical thrombectomy   . Malnutrition of moderate degree 01/09/2016  . GI bleed 01/06/2016  . Chronic atrial fibrillation ([place]) 06/10/2015  . Pulmonary hypertension, secondary ([place]) 06/10/2015  . Chronic anticoagulation 06/10/2015  . HLD (hyperlipidemia) 06/10/2015  . Essential hypertension 06/10/2015  . History of stroke 06/10/2015  . Coronary artery disease due to lipid rich plaque 06/10/2015  . Afib ([place])   . Elevated uric acid in blood   .  Arthritis of ankle   . Pulmonary HTN     Zannie Cove, PT 11/24/2016, 2:39 PM  [facility] [address], [place], [place], [address] Phone: [phone]   Fax:  [phone]  Name: Pearlie Nies MRN: [id number] Date of Birth: Dec 25, 1927

## 2016-11-25 DIAGNOSIS — M810 Age-related osteoporosis without current pathological fracture: Secondary | ICD-10-CM | POA: Diagnosis not present

## 2016-11-25 DIAGNOSIS — K219 Gastro-esophageal reflux disease without esophagitis: Secondary | ICD-10-CM | POA: Diagnosis not present

## 2016-11-25 DIAGNOSIS — R339 Retention of urine, unspecified: Secondary | ICD-10-CM | POA: Diagnosis not present

## 2016-11-25 DIAGNOSIS — Z8673 Personal history of transient ischemic attack (TIA), and cerebral infarction without residual deficits: Secondary | ICD-10-CM | POA: Diagnosis not present

## 2016-11-25 DIAGNOSIS — I2510 Atherosclerotic heart disease of native coronary artery without angina pectoris: Secondary | ICD-10-CM | POA: Diagnosis not present

## 2016-11-25 DIAGNOSIS — I4891 Unspecified atrial fibrillation: Secondary | ICD-10-CM | POA: Diagnosis not present

## 2016-11-28 DIAGNOSIS — R339 Retention of urine, unspecified: Secondary | ICD-10-CM | POA: Diagnosis not present

## 2016-11-28 DIAGNOSIS — R8290 Unspecified abnormal findings in urine: Secondary | ICD-10-CM | POA: Diagnosis not present

## 2016-11-28 DIAGNOSIS — Z8673 Personal history of transient ischemic attack (TIA), and cerebral infarction without residual deficits: Secondary | ICD-10-CM | POA: Diagnosis not present

## 2016-11-28 DIAGNOSIS — E7800 Pure hypercholesterolemia, unspecified: Secondary | ICD-10-CM | POA: Diagnosis not present

## 2016-11-28 DIAGNOSIS — M810 Age-related osteoporosis without current pathological fracture: Secondary | ICD-10-CM | POA: Diagnosis not present

## 2016-11-28 DIAGNOSIS — K219 Gastro-esophageal reflux disease without esophagitis: Secondary | ICD-10-CM | POA: Diagnosis not present

## 2016-11-28 DIAGNOSIS — I4891 Unspecified atrial fibrillation: Secondary | ICD-10-CM | POA: Diagnosis not present

## 2016-11-28 DIAGNOSIS — Z79899 Other long term (current) drug therapy: Secondary | ICD-10-CM | POA: Diagnosis not present

## 2016-11-28 DIAGNOSIS — I2510 Atherosclerotic heart disease of native coronary artery without angina pectoris: Secondary | ICD-10-CM | POA: Diagnosis not present

## 2016-11-29 ENCOUNTER — Encounter: Payer: Self-pay | Admitting: Physical Therapy

## 2016-11-29 ENCOUNTER — Ambulatory Visit: Payer: Medicare Other | Admitting: Physical Therapy

## 2016-11-29 DIAGNOSIS — R278 Other lack of coordination: Secondary | ICD-10-CM | POA: Diagnosis not present

## 2016-11-29 DIAGNOSIS — M6281 Muscle weakness (generalized): Secondary | ICD-10-CM

## 2016-11-29 DIAGNOSIS — R2689 Other abnormalities of gait and mobility: Secondary | ICD-10-CM

## 2016-11-29 DIAGNOSIS — R29898 Other symptoms and signs involving the musculoskeletal system: Secondary | ICD-10-CM

## 2016-11-29 NOTE — Therapy (Signed)
[facility] [address], [place], [place], [address] Phone: [phone]   Fax:  [phone]  Physical Therapy Treatment  Patient Details  Name: Adrienne [name] MRN: [id number] Date of Birth: Mar 27, 1928 Referring Provider: Delrae Rend, MD  Encounter Date: 11/29/2016      PT End of Session - 11/29/16 1409    Visit Number 16   Number of Visits 20   Date for PT Re-Evaluation 2016-12-30   Authorization Type g codes at 20th visit; use KX   PT Start Time 1402   PT Stop Time 1443   PT Time Calculation (min) 41 min   Activity Tolerance Patient tolerated treatment well   Behavior During Therapy [facility] for tasks assessed/performed      Past Medical History:  Diagnosis Date  . Afib ([place])   . Arthritis of ankle   . CAD (coronary artery disease)   . Cancer [facility]) 2012   breast cancer-right  . Compression fracture of T12 vertebra (HCC)   . Diverticulosis   . Edema   . Elevated uric acid in blood   . GI bleed 12/2015  . Hypertension   . Long-term (current) use of anticoagulants   . Moderate mitral regurgitation   . Pulmonary HTN   . Stroke ([place]) 01/25/2016  . TIA (transient ischemic attack) 09/2011  . Urinary incontinence     Past Surgical History:  Procedure Laterality Date  . APPENDECTOMY    . BREAST SURGERY  2012   Rt.mastectomy--[place], [place]  2013  . CATARACT EXTRACTION, BILATERAL    . DILATION AND CURETTAGE OF UTERUS     in her early 74's for DUB  . ESOPHAGOGASTRODUODENOSCOPY Left 01/08/2016   Procedure: ESOPHAGOGASTRODUODENOSCOPY (EGD);  Surgeon: Arta Silence, MD;  Location: [facility];  Service: Endoscopy;  Laterality: Left;  . GALLBLADDER SURGERY    . MASTECTOMY Right   . RADIOLOGY WITH ANESTHESIA N/A 01/25/2016   Procedure: RADIOLOGY WITH ANESTHESIA;  Surgeon: Luanne Bras, MD;  Location: [place];  Service: Radiology;  Laterality: N/A;  . REPLACEMENT TOTAL KNEE BILATERAL     . TONSILLECTOMY AND ADENOIDECTOMY     as a child    There were no vitals filed for this visit.      Subjective Assessment - 11/29/16 1408    Subjective Back is doing good.  States she is doing pretty good with the exercises at home currently.   Pertinent History osteoporosis, T12 fracture, history of CA   Patient Stated Goals feel strong in my back and not have any pain   Currently in Pain? No/denies            [facility] PT Assessment - 11/29/16 0001      Berg Balance Test   Sit to Stand Able to stand without using hands and stabilize independently   Standing Unsupported Able to stand safely 2 minutes   Sitting with Back Unsupported but Feet Supported on Floor or Stool Able to sit safely and securely 2 minutes   Stand to Sit Sits safely with minimal use of hands   Transfers Able to transfer safely, minor use of hands   Standing Unsupported with Eyes Closed Able to stand 10 seconds safely   Standing Ubsupported with Feet Together Able to place feet together independently and stand 1 minute safely   From Standing, Reach Forward with Outstretched Arm Can reach confidently >25 cm (10")   From Standing Position, Pick up Object from Floor Able to pick  up shoe safely and easily   From Standing Position, Turn to Look Behind Over each Shoulder Looks behind from both sides and weight shifts well   Turn 360 Degrees Able to turn 360 degrees safely in 4 seconds or less   Standing Unsupported, Alternately Place Feet on Step/Stool Able to stand independently and safely and complete 8 steps in 20 seconds   Standing Unsupported, One Foot in Front Able to plae foot ahead of the other independently and hold 30 seconds   Standing on One Leg Tries to lift leg/unable to hold 3 seconds but remains standing independently   Total Score 52     Timed Up and Go Test   TUG Normal TUG   Normal TUG (seconds) 9                     [facility] Adult PT Treatment/Exercise - 11/29/16 0001       Lumbar Exercises: Standing   Row Strengthening;Both;20 reps;Theraband   Theraband Level (Row) Level 3 (Green)   Shoulder Extension Strengthening;Both;20 reps;Theraband   Theraband Level (Shoulder Extension) Level 3 (Green)     Lumbar Exercises: Seated   Long Arc Quad on Chair Strengthening;Right;Left;2 sets;10 reps     Shoulder Exercises: Standing   Other Standing Exercises pec stretch 2x20 sec                PT Education - 11/29/16 1442    Education provided Yes   Education Details pec stretch in standing and reviewed other exercises in HEP   Person(s) Educated Patient   Methods Explanation;Demonstration;Verbal cues;Handout   Comprehension Verbalized understanding;Returned demonstration          PT Short Term Goals - 11/29/16 1445      PT SHORT TERM GOAL #1   Title independent with initial HEP   Baseline met    Time 4   Period Weeks   Status Achieved     PT SHORT TERM GOAL #2   Title Pt will improve BERG score to >/=47/56 to decr. falls risk.   Period Weeks   Status Achieved     PT SHORT TERM GOAL #3   Title Pain reduced by 50% when getting out of recliner   Baseline 30%   Time 4   Period Weeks   Status Not Met     PT SHORT TERM GOAL #4   Title TUG < or = to 14 sec for decreased fall risk   Period Weeks   Status Achieved           PT Long Term Goals - 11/29/16 1426      PT LONG TERM GOAL #1   Title independent with advanced HEP   Time 8   Period Weeks   Status Achieved     PT LONG TERM GOAL #2   Title Pt will improve BERG score to >/=51/56 to decr. falls risk   Baseline 52/56   Time 8   Period Weeks   Status Achieved     PT LONG TERM GOAL #3   Title Increased height to 158 cm on scale to demonstrate improved posture   Baseline 156.5   Time 8   Period Weeks   Status Not Met     PT LONG TERM GOAL #4   Title TUG < or = to 13 sec for reduced fall risk    Baseline 9   Time 8   Period Weeks   Status Achieved  Plan - 2016-12-22 1412    Clinical Impression Statement Pt has been consistently feeling better and she is doing well with exercises at this time.  Pt will d/c with HEP today.   Rehab Potential Excellent   Clinical Impairments Affecting Rehab Potential osteoporosis, LE edema, A-fib   PT Treatment/Interventions ADLs/Self Care Home Management;Cryotherapy;Moist Heat;Electrical Stimulation;Gait training;Stair training;Functional mobility training;Therapeutic activities;Therapeutic exercise;Balance training;Neuromuscular re-education;Patient/family education;Manual techniques   Consulted and Agree with Plan of Care Patient      Patient will benefit from skilled therapeutic intervention in order to improve the following deficits and impairments:  Abnormal gait, Decreased activity tolerance, Decreased balance, Decreased endurance, Decreased range of motion, Decreased strength, Postural dysfunction, Pain, Increased edema  Visit Diagnosis: Other abnormalities of gait and mobility  Muscle weakness (generalized)  Weakness of both lower extremities  Other lack of coordination       G-Codes - 12-22-2016 1445    Functional Assessment Tool Used (Outpatient Only) Berg balance, TUG, postural assessment, clinical impression   Functional Limitation Changing and maintaining body position   Changing and Maintaining Body Position Current Status (M3559) At least 20 percent but less than 40 percent impaired, limited or restricted   Changing and Maintaining Body Position Goal Status (R4163) At least 20 percent but less than 40 percent impaired, limited or restricted      Problem List Patient Active Problem List   Diagnosis Date Noted  . Diplopia 01/29/2016  . GERD (gastroesophageal reflux disease) 01/29/2016  . Cerebrovascular accident (CVA) due to embolism of left middle cerebral artery ([place]) 01/29/2016  . Persistent atrial fibrillation ([place])   . Bleeding gastrointestinal   . Gait disturbance,  post-stroke 01/27/2016  . Fall   . Secondary hypertension, unspecified   . Cerebral infarction due to thrombosis of left middle cerebral artery (HCC) s/p mechanical thrombectomy   . Malnutrition of moderate degree 01/09/2016  . GI bleed 01/06/2016  . Chronic atrial fibrillation ([place]) 06/10/2015  . Pulmonary hypertension, secondary ([place]) 06/10/2015  . Chronic anticoagulation 06/10/2015  . HLD (hyperlipidemia) 06/10/2015  . Essential hypertension 06/10/2015  . History of stroke 06/10/2015  . Coronary artery disease due to lipid rich plaque 06/10/2015  . Afib ([place])   . Elevated uric acid in blood   . Arthritis of ankle   . Pulmonary HTN     Zannie Cove, PT Dec 22, 2016, 2:46 PM  [facility] [address], [place], [place], [address] Phone: [phone]   Fax:  [phone]  Name: Aleira Deiter MRN: [id number] Date of Birth: [birth year]  PHYSICAL THERAPY DISCHARGE SUMMARY  Visits from Start of Care: 16  Current functional level related to goals / functional outcomes: See above note   Remaining deficits: See above details   Education / Equipment: HEP  Plan: Patient agrees to discharge.  Patient goals were partially met. Patient is being discharged due to being pleased with the current functional level.  ?????        Google, PT December 22, 2016 2:49 PM

## 2016-11-29 NOTE — Patient Instructions (Signed)
Pectoral Stretch    With arms behind doorjamb, gently lean forward. Stretch is felt across chest. Hold __30__ seconds. Repeat ___3_ times. Do __1__ sessions per day.  http://gt2.exer.us/33   Copyright  VHI. All rights reserved.

## 2016-12-01 ENCOUNTER — Encounter: Payer: Medicare Other | Admitting: Physical Therapy

## 2016-12-05 DIAGNOSIS — M25521 Pain in right elbow: Secondary | ICD-10-CM | POA: Diagnosis not present

## 2016-12-05 DIAGNOSIS — Z1389 Encounter for screening for other disorder: Secondary | ICD-10-CM | POA: Diagnosis not present

## 2016-12-05 DIAGNOSIS — M25421 Effusion, right elbow: Secondary | ICD-10-CM | POA: Diagnosis not present

## 2016-12-05 DIAGNOSIS — N390 Urinary tract infection, site not specified: Secondary | ICD-10-CM | POA: Diagnosis not present

## 2016-12-06 ENCOUNTER — Encounter: Payer: Medicare Other | Admitting: Physical Therapy

## 2016-12-07 DIAGNOSIS — N390 Urinary tract infection, site not specified: Secondary | ICD-10-CM | POA: Diagnosis not present

## 2016-12-07 DIAGNOSIS — J069 Acute upper respiratory infection, unspecified: Secondary | ICD-10-CM | POA: Diagnosis not present

## 2016-12-07 DIAGNOSIS — M25521 Pain in right elbow: Secondary | ICD-10-CM | POA: Diagnosis not present

## 2016-12-08 ENCOUNTER — Encounter: Payer: Medicare Other | Admitting: Physical Therapy

## 2016-12-12 DIAGNOSIS — N3946 Mixed incontinence: Secondary | ICD-10-CM | POA: Diagnosis not present

## 2016-12-12 DIAGNOSIS — R350 Frequency of micturition: Secondary | ICD-10-CM | POA: Diagnosis not present

## 2016-12-12 DIAGNOSIS — N3020 Other chronic cystitis without hematuria: Secondary | ICD-10-CM | POA: Diagnosis not present

## 2016-12-19 NOTE — Progress Notes (Signed)
83 y.o. G80P3003 Married Caucasian female here for annual exam.    Patient with urinary urgency and frequency.  Has overactive bladder.  Some difficulty emptying bladder.  PCP has treated for UTI not long ago. Treated with Ciprofloxacin for acute infection.  She had elbow pain and saw switched to Bactrim.  Usually takes Keflex for prophylaxis. Feeling much better overall and is not having dysuria.  Saw Dr. Matilde Sprang recently in follow up for her bladder care. He thought the prolapse was causing some of her problems.  Uses pessary for incomplete uterovaginal prolapse. Can remove and replace with the help of her husband.   She has an occasional bit of bleeding which we have attributed to her pessary use.  She did have a negative EMB.  No longer taking Anastrozole.  Has completed 5 years of tx.  PCP:  Kathyrn Lass, MD    Patient's last menstrual period was 09/12/1973 (approximate).           Sexually active: Yes.   female The current method of family planning is post menopausal status.    Exercising: Yes.    Core strength exercises Smoker:  Former  Health Maintenance: Pap: 11-25-15 Neg;2012 normal per patient  History of abnormal Pap:  no MMG:07/2014 normal in [place] per patient(hx Rt.Br.ca w/Rt.mastectomy).  Has mammogram yearly in [place], [place].   Colonoscopy: 8-10 years ago in [place]--normal per patient BMD: 2-3 years ago  Result: Osteopenia--unsure of where she had this.  Hx reaction to Zometa.  Told she should not take bisphosphonates.  Currently not treating.  TDaP: 2016 Gardasil:   n/a Hep C:  NA. Screening Labs:  Hb today: PCP, Urine today: not done   reports that she has quit smoking. Her smoking use included Cigarettes. She has never used smokeless tobacco. She reports that she does not drink alcohol or use drugs.  Past Medical History:  Diagnosis Date  . Afib ([place])   . Arthritis of ankle   . CAD (coronary artery disease)   . Cancer [facility]) 2012   breast cancer-right  .  Compression fracture of T12 vertebra (HCC)   . Diverticulosis   . Edema   . Elevated uric acid in blood   . GI bleed 12/2015  . Hyperlipidemia   . Hypertension   . Long-term (current) use of anticoagulants   . Moderate mitral regurgitation   . Pulmonary HTN   . Stroke ([place]) 01/25/2016  . TIA (transient ischemic attack) 09/2011  . Urinary incontinence     Past Surgical History:  Procedure Laterality Date  . APPENDECTOMY    . BREAST SURGERY  2012   Rt.mastectomy--[place], [place]  2013  . CATARACT EXTRACTION, BILATERAL    . DILATION AND CURETTAGE OF UTERUS     in her early 67's for DUB  . ESOPHAGOGASTRODUODENOSCOPY Left 01/08/2016   Procedure: ESOPHAGOGASTRODUODENOSCOPY (EGD);  Surgeon: Arta Silence, MD;  Location: [facility];  Service: Endoscopy;  Laterality: Left;  . GALLBLADDER SURGERY    . MASTECTOMY Right   . RADIOLOGY WITH ANESTHESIA N/A 01/25/2016   Procedure: RADIOLOGY WITH ANESTHESIA;  Surgeon: Luanne Bras, MD;  Location: [facility];  Service: Radiology;  Laterality: N/A;  . REPLACEMENT TOTAL KNEE BILATERAL    . TONSILLECTOMY AND ADENOIDECTOMY     as a child    Current Outpatient Prescriptions  Medication Sig Dispense Refill  . acetaminophen (TYLENOL) 500 MG tablet Take 500-1,000 mg by mouth every 12 (twelve) hours as needed (pain).     Marland Kitchen  amoxicillin (AMOXIL) 500 MG capsule 500 mg. Patient takes 4 tablets by mouth once prior to dental procedures    . apixaban (ELIQUIS) 5 MG TABS tablet Take 1 tablet (5 mg total) by mouth 2 (two) times daily. 180 tablet 2  . atorvastatin (LIPITOR) 10 MG tablet Take 1 tablet (10 mg total) by mouth daily. 90 tablet 3  . CALCIUM-VITAMIN D PO Take 1 tablet by mouth 2 (two) times daily.    . cephALEXin (KEFLEX) 250 MG capsule Take 250 mg by mouth daily.    . cholecalciferol (VITAMIN D) 1000 units tablet Take 1,000 Units by mouth daily.    Marland Kitchen dextromethorphan (DELSYM) 30 MG/5ML liquid Take 60 mg by mouth 2 (two)  times daily as needed for cough.    . diltiazem (CARDIZEM) 60 MG tablet Take 1 tablet (60 mg total) by mouth 3 (three) times daily. 270 tablet 2  . furosemide (LASIX) 20 MG tablet Take 20 mg by mouth daily.    Marland Kitchen loperamide (IMODIUM) 2 MG capsule Take by mouth as needed for diarrhea or loose stools.    . metoprolol (LOPRESSOR) 100 MG tablet Take 1 tablet (100 mg total) by mouth 2 (two) times daily. 180 tablet 2  . Multiple Vitamins-Minerals (MULTIVITAMIN ADULT PO) Take 1 tablet by mouth daily.     Marland Kitchen omeprazole (PRILOSEC) 40 MG capsule Take 40 mg by mouth daily.     Marland Kitchen PROAIR HFA 108 (90 Base) MCG/ACT inhaler as needed.     . Red Yeast Rice Extract (RED YEAST RICE PO) Take 1 tablet by mouth 2 (two) times daily.    . Throat Lozenges (COUGH DROPS MT) Use as directed 1 lozenge in the mouth or throat every 4 (four) hours as needed (cough).    . vitamin C (ASCORBIC ACID) 500 MG tablet Take 1,000 mg by mouth daily. Takes 2 tabs daily     No current facility-administered medications for this visit.     Family History  Problem Relation Age of Onset  . Asthma Mother   . CVA Father   . Atrial fibrillation Sister     HAS PACER  . Pneumonia Brother   . Cancer Daughter     COLON CANCER  . Cancer Maternal Grandmother     breast cancer    ROS:  Pertinent items are noted in HPI.  Otherwise, a comprehensive ROS was negative.  Exam:   BP 138/80 (BP Location: Left Arm, Patient Position: Sitting, Cuff Size: Normal)   Pulse 70   Ht 5\' 1"  (1.549 m)   Wt 147 lb 12.8 oz (67 kg)   LMP 09/12/1973 (Approximate) Comment: spotting  BMI 27.93 kg/m     General appearance: alert, cooperative and appears stated age Head: Normocephalic, without obvious abnormality, atraumatic Neck: no adenopathy, supple, symmetrical, trachea midline and thyroid normal to inspection and palpation Lungs: clear to auscultation bilaterally Breasts: right breast absent, left breast normal appearance, no masses or tenderness, No  nipple retraction or dimpling, No nipple discharge or bleeding, No axillary or supraclavicular adenopathy Heart: regular rate and rhythm Abdomen: soft, non-tender; no masses, no organomegaly Extremities: extremities normal, atraumatic, no cyanosis or edema Skin: Skin color, texture, turgor normal. No rashes or lesions Lymph nodes: Cervical, supraclavicular, and axillary nodes normal. No abnormal inguinal nodes palpated Neurologic: Grossly normal  Pelvic: External genitalia:  no lesions              Urethra:  normal appearing urethra with no masses, tenderness or lesions  Bartholins and Skenes: normal                 Vagina: normal appearing vagina with normal color and discharge, no lesions              Cervix: no lesions              Pap taken: No. Bimanual Exam:  Uterus:  normal size, contour, position, consistency, mobility, non-tender              Adnexa: no mass, fullness, tenderness              Rectal exam: Yes.  .  Confirms.              Anus:  normal sphincter tone, no lesions  Ring pessary with support removed, cleansed, and replaced.  Prolapse does protrude beyond the pessary.   Chaperone was present for exam.  Assessment:   Well woman visit with normal exam. Hx right breast cancer.  FH of breast cancer in maternal GM breast cancer and colon cancer in daughter.  Status post right mastectomy.  Hx stroke.  Hx atrial fibrillation. On Eliquis. Hx osteopenia.  Has compression fracture of the spine.  Has had consultation with Dr. Buddy Duty.  Plan: Mammogram screening discussed. Recommended self breast awareness. Pap and HR HPV as above. Guidelines for Calcium, Vitamin D, regular exercise program including cardiovascular and weight bearing exercise. Will get mammogram reports from her radiologist in [place].  Continue pessary use.  We talked about fitting for a larger or different pessary.  Patient will continue with this pessary for now.  Follow up annually and prn.        After visit summary provided.

## 2016-12-21 ENCOUNTER — Ambulatory Visit (INDEPENDENT_AMBULATORY_CARE_PROVIDER_SITE_OTHER): Payer: Medicare Other | Admitting: Obstetrics and Gynecology

## 2016-12-21 ENCOUNTER — Encounter: Payer: Self-pay | Admitting: Obstetrics and Gynecology

## 2016-12-21 VITALS — BP 138/80 | HR 70 | Ht 61.0 in | Wt 147.8 lb

## 2016-12-21 DIAGNOSIS — Z01419 Encounter for gynecological examination (general) (routine) without abnormal findings: Secondary | ICD-10-CM | POA: Diagnosis not present

## 2016-12-21 DIAGNOSIS — Z124 Encounter for screening for malignant neoplasm of cervix: Secondary | ICD-10-CM

## 2016-12-21 NOTE — Patient Instructions (Signed)

## 2016-12-24 ENCOUNTER — Encounter (HOSPITAL_COMMUNITY): Payer: Self-pay | Admitting: Emergency Medicine

## 2016-12-24 ENCOUNTER — Emergency Department (HOSPITAL_COMMUNITY)
Admission: EM | Admit: 2016-12-24 | Discharge: 2016-12-24 | Disposition: A | Discharge status: Home / Self Care | Payer: Medicare Other | Attending: Emergency Medicine | Admitting: Emergency Medicine

## 2016-12-24 DIAGNOSIS — I2510 Atherosclerotic heart disease of native coronary artery without angina pectoris: Secondary | ICD-10-CM | POA: Insufficient documentation

## 2016-12-24 DIAGNOSIS — Z853 Personal history of malignant neoplasm of breast: Secondary | ICD-10-CM | POA: Diagnosis not present

## 2016-12-24 DIAGNOSIS — Z79899 Other long term (current) drug therapy: Secondary | ICD-10-CM | POA: Diagnosis not present

## 2016-12-24 DIAGNOSIS — Z96653 Presence of artificial knee joint, bilateral: Secondary | ICD-10-CM | POA: Insufficient documentation

## 2016-12-24 DIAGNOSIS — I83891 Varicose veins of right lower extremities with other complications: Secondary | ICD-10-CM | POA: Diagnosis not present

## 2016-12-24 DIAGNOSIS — I83899 Varicose veins of unspecified lower extremities with other complications: Secondary | ICD-10-CM | POA: Diagnosis not present

## 2016-12-24 DIAGNOSIS — Z87891 Personal history of nicotine dependence: Secondary | ICD-10-CM | POA: Diagnosis not present

## 2016-12-24 DIAGNOSIS — I10 Essential (primary) hypertension: Secondary | ICD-10-CM | POA: Insufficient documentation

## 2016-12-24 DIAGNOSIS — Z7901 Long term (current) use of anticoagulants: Secondary | ICD-10-CM | POA: Diagnosis not present

## 2016-12-24 DIAGNOSIS — Z8673 Personal history of transient ischemic attack (TIA), and cerebral infarction without residual deficits: Secondary | ICD-10-CM | POA: Insufficient documentation

## 2016-12-24 LAB — CBC
HEMATOCRIT: 41 % (ref 36.0–46.0)
HEMOGLOBIN: 13.7 g/dL (ref 12.0–15.0)
MCH: 30.1 pg (ref 26.0–34.0)
MCHC: 33.4 g/dL (ref 30.0–36.0)
MCV: 90.1 fL (ref 78.0–100.0)
Platelets: 280 10*3/uL (ref 150–400)
RBC: 4.55 MIL/uL (ref 3.87–5.11)
RDW: 13.7 % (ref 11.5–15.5)
WBC: 9.6 10*3/uL (ref 4.0–10.5)

## 2016-12-24 LAB — BASIC METABOLIC PANEL
ANION GAP: 9 (ref 5–15)
BUN: 17 mg/dL (ref 6–20)
CHLORIDE: 104 mmol/L (ref 101–111)
CO2: 26 mmol/L (ref 22–32)
Calcium: 9.6 mg/dL (ref 8.9–10.3)
Creatinine, Ser: 0.97 mg/dL (ref 0.44–1.00)
GFR calc non Af Amer: 51 mL/min — ABNORMAL LOW (ref >60)
GFR, EST AFRICAN AMERICAN: 59 mL/min — AB (ref >60)
Glucose, Bld: 98 mg/dL (ref 65–99)
POTASSIUM: 3.9 mmol/L (ref 3.5–5.1)
SODIUM: 139 mmol/L (ref 135–145)

## 2016-12-24 LAB — PROTIME-INR
INR: 1.27
Prothrombin Time: 15.9 seconds — ABNORMAL HIGH (ref 11.4–15.2)

## 2016-12-24 NOTE — ED Provider Notes (Signed)
[facility] DEPT Provider Note   CSN: [id number] Arrival date & time: 12/24/16  1946     History   Chief Complaint Chief Complaint  Patient presents with  . Bleeding/Bruising    HPI Adrienne [name] is a 83 y.o. female.  Pt presents to the ED today with a bleeding blood vessel.  The pt said it started after drying herself off after a shower.  The pt said the bleeding would not stop.  Her husband put a pressure dressing on it.  No other injuries.  She is on Eliquis due to a.fib.      Past Medical History:  Diagnosis Date  . Afib ([place])   . Arthritis of ankle   . CAD (coronary artery disease)   . Cancer [facility]) 2012   breast cancer-right  . Compression fracture of T12 vertebra (HCC)   . Diverticulosis   . Edema   . Elevated uric acid in blood   . GI bleed 12/2015  . Hyperlipidemia   . Hypertension   . Long-term (current) use of anticoagulants   . Moderate mitral regurgitation   . Pulmonary HTN ([place])   . Stroke ([place]) 01/25/2016  . TIA (transient ischemic attack) 09/2011  . Urinary incontinence     Patient Active Problem List   Diagnosis Date Noted  . Diplopia 01/29/2016  . GERD (gastroesophageal reflux disease) 01/29/2016  . Cerebrovascular accident (CVA) due to embolism of left middle cerebral artery ([place]) 01/29/2016  . Persistent atrial fibrillation ([place])   . Bleeding gastrointestinal   . Gait disturbance, post-stroke 01/27/2016  . Fall   . Secondary hypertension, unspecified   . Cerebral infarction due to thrombosis of left middle cerebral artery (HCC) s/p mechanical thrombectomy   . Malnutrition of moderate degree 01/09/2016  . GI bleed 01/06/2016  . Chronic atrial fibrillation ([place]) 06/10/2015  . Pulmonary hypertension, secondary ([place]) 06/10/2015  . Chronic anticoagulation 06/10/2015  . HLD (hyperlipidemia) 06/10/2015  . Essential hypertension 06/10/2015  . History of stroke 06/10/2015  . Coronary artery disease due to lipid rich plaque 06/10/2015  .  Afib ([place])   . Elevated uric acid in blood   . Arthritis of ankle   . Pulmonary HTN ([place])     Past Surgical History:  Procedure Laterality Date  . APPENDECTOMY    . BREAST SURGERY  2012   Rt.mastectomy--[place], [place]  2013  . CATARACT EXTRACTION, BILATERAL    . DILATION AND CURETTAGE OF UTERUS     in her early 24's for DUB  . ESOPHAGOGASTRODUODENOSCOPY Left 01/08/2016   Procedure: ESOPHAGOGASTRODUODENOSCOPY (EGD);  Surgeon: Arta Silence, MD;  Location: [facility];  Service: Endoscopy;  Laterality: Left;  . GALLBLADDER SURGERY    . MASTECTOMY Right   . RADIOLOGY WITH ANESTHESIA N/A 01/25/2016   Procedure: RADIOLOGY WITH ANESTHESIA;  Surgeon: Luanne Bras, MD;  Location: [facility];  Service: Radiology;  Laterality: N/A;  . REPLACEMENT TOTAL KNEE BILATERAL    . TONSILLECTOMY AND ADENOIDECTOMY     as a child    OB History    Gravida Para Term Preterm AB Living   3 3 3     3    SAB TAB Ectopic Multiple Live Births                   Home Medications    Prior to Admission medications   Medication Sig Start Date End Date Taking? Authorizing Provider  acetaminophen (TYLENOL) 500 MG tablet Take 500-1,000 mg by mouth every  12 (twelve) hours as needed (pain).    Yes Historical Provider, MD  amoxicillin (AMOXIL) 500 MG capsule 500 mg. Patient takes 4 tablets by mouth once prior to dental procedures   Yes Historical Provider, MD  apixaban (ELIQUIS) 5 MG TABS tablet Take 1 tablet (5 mg total) by mouth 2 (two) times daily. 06/14/16  Yes Jerline Pain, MD  atorvastatin (LIPITOR) 10 MG tablet Take 1 tablet (10 mg total) by mouth daily. 10/12/16 01/10/17 Yes Jerline Pain, MD  CALCIUM-VITAMIN D PO Take 1 tablet by mouth 2 (two) times daily.   Yes Historical Provider, MD  cephALEXin (KEFLEX) 250 MG capsule Take 250 mg by mouth daily. 07/03/16  Yes Historical Provider, MD  cholecalciferol (VITAMIN D) 1000 units tablet Take 1,000 Units by mouth daily.   Yes Historical  Provider, MD  dextromethorphan-guaiFENesin (MUCINEX DM) 30-600 MG 12hr tablet Take 1 tablet by mouth 2 (two) times daily.   Yes Historical Provider, MD  diltiazem (CARDIZEM) 60 MG tablet Take 1 tablet (60 mg total) by mouth 3 (three) times daily. 06/30/16  Yes Jerline Pain, MD  furosemide (LASIX) 20 MG tablet Take 20 mg by mouth daily.   Yes Historical Provider, MD  metoprolol (LOPRESSOR) 100 MG tablet Take 1 tablet (100 mg total) by mouth 2 (two) times daily. 06/30/16  Yes Jerline Pain, MD  Multiple Vitamins-Minerals (MULTIVITAMIN ADULT PO) Take 1 tablet by mouth daily.    Yes Historical Provider, MD  omeprazole (PRILOSEC) 40 MG capsule Take 40 mg by mouth daily.    Yes Historical Provider, MD  PROAIR HFA 108 [phone] Base) MCG/ACT inhaler Inhale 1-2 puffs into the lungs every 6 (six) hours as needed for wheezing or shortness of breath.  05/05/16  Yes Historical Provider, MD  Red Yeast Rice Extract (RED YEAST RICE PO) Take 1 tablet by mouth 2 (two) times daily.   Yes Historical Provider, MD  vitamin C (ASCORBIC ACID) 500 MG tablet Take 1,000 mg by mouth daily. Takes 2 tabs daily   Yes Historical Provider, MD    Family History Family History  Problem Relation Age of Onset  . Asthma Mother   . CVA Father   . Atrial fibrillation Sister     HAS PACER  . Pneumonia Brother   . Cancer Daughter     COLON CANCER  . Cancer Maternal Grandmother     breast cancer    Social History Social History  Substance Use Topics  . Smoking status: Former Smoker    Types: Cigarettes  . Smokeless tobacco: Never Used  . Alcohol use No     Allergies   Valium [diazepam]; Amiodarone hcl [amiodarone]; Compazine [prochlorperazine]; Other; Thorazine [chlorpromazine]; Zometa [zoledronic acid]; and Ciprofloxacin   Review of Systems Review of Systems  Skin: Positive for wound.  All other systems reviewed and are negative.    Physical Exam Updated Vital Signs BP (!) 168/79 (BP Location: Left Arm)   Pulse  76   Temp 98.1 F (36.7 C) (Oral)   Resp 16   Ht 5\' 1"  (1.549 m)   Wt 149 lb 9 oz (67.8 kg)   LMP 09/12/1973 (Approximate) Comment: spotting  SpO2 96%   BMI 28.26 kg/m   Physical Exam  Constitutional: She is oriented to person, place, and time. She appears well-developed and well-nourished.  HENT:  Head: Normocephalic and atraumatic.  Right Ear: External ear normal.  Left Ear: External ear normal.  Nose: Nose normal.  Mouth/Throat: Oropharynx is clear and  moist.  Eyes: Conjunctivae and EOM are normal. Pupils are equal, round, and reactive to light.  Neck: Normal range of motion. Neck supple.  Cardiovascular: Normal rate, regular rhythm, normal heart sounds and intact distal pulses.   Pulmonary/Chest: Effort normal and breath sounds normal.  Abdominal: Soft. Bowel sounds are normal.  Musculoskeletal: Normal range of motion.  Neurological: She is alert and oriented to person, place, and time.  Skin:     Nursing note and vitals reviewed.    ED Treatments / Results  Labs (all labs ordered are listed, but only abnormal results are displayed) Labs Reviewed  PROTIME-INR - Abnormal; Notable for the following:       Result Value   Prothrombin Time 15.9 (*)    All other components within normal limits  BASIC METABOLIC PANEL - Abnormal; Notable for the following:    GFR calc non Af Amer 51 (*)    GFR calc Af Amer 59 (*)    All other components within normal limits  CBC    EKG  EKG Interpretation None       Radiology No results found.  Procedures Procedures (including critical care time)  Medications Ordered in ED Medications - No data to display   Initial Impression / Assessment and Plan / ED Course  I have reviewed the triage vital signs and the nursing notes.  Pertinent labs & imaging results that were available during my care of the patient were reviewed by me and considered in my medical decision making (see chart for details).    Pt observed and no  further bleeding.  Pt is stable for d/c.  Final Clinical Impressions(s) / ED Diagnoses   Final diagnoses:  Bleeding from varicose vein    New Prescriptions New Prescriptions   No medications on file     Isla Pence, MD 12/24/16 2149

## 2016-12-24 NOTE — ED Triage Notes (Signed)
Patient reports drying off legs this evening when a blood vessel started bleeding to right lower leg.  Wrapped with controlled bleeding at triage.  Hx of afib on blood thinners.

## 2016-12-26 DIAGNOSIS — H04123 Dry eye syndrome of bilateral lacrimal glands: Secondary | ICD-10-CM | POA: Diagnosis not present

## 2016-12-26 DIAGNOSIS — Z961 Presence of intraocular lens: Secondary | ICD-10-CM | POA: Diagnosis not present

## 2016-12-26 DIAGNOSIS — H524 Presbyopia: Secondary | ICD-10-CM | POA: Diagnosis not present

## 2017-01-03 DIAGNOSIS — I83899 Varicose veins of unspecified lower extremities with other complications: Secondary | ICD-10-CM | POA: Diagnosis not present

## 2017-01-03 DIAGNOSIS — K219 Gastro-esophageal reflux disease without esophagitis: Secondary | ICD-10-CM | POA: Diagnosis not present

## 2017-01-03 DIAGNOSIS — R609 Edema, unspecified: Secondary | ICD-10-CM | POA: Diagnosis not present

## 2017-01-10 ENCOUNTER — Other Ambulatory Visit: Payer: Medicare Other | Admitting: *Deleted

## 2017-01-10 DIAGNOSIS — E785 Hyperlipidemia, unspecified: Secondary | ICD-10-CM

## 2017-01-10 DIAGNOSIS — Z79899 Other long term (current) drug therapy: Secondary | ICD-10-CM | POA: Diagnosis not present

## 2017-01-10 LAB — LIPID PANEL
CHOLESTEROL TOTAL: 121 mg/dL (ref 100–199)
Chol/HDL Ratio: 2.2 ratio (ref 0.0–4.4)
HDL: 54 mg/dL (ref >39)
LDL Calculated: 52 mg/dL (ref 0–99)
Triglycerides: 76 mg/dL (ref 0–149)
VLDL Cholesterol Cal: 15 mg/dL (ref 5–40)

## 2017-01-10 LAB — ALT: ALT: 7 IU/L (ref 0–32)

## 2017-01-16 ENCOUNTER — Telehealth: Payer: Self-pay | Admitting: Obstetrics and Gynecology

## 2017-01-16 ENCOUNTER — Telehealth: Payer: Self-pay | Admitting: Cardiology

## 2017-01-16 NOTE — Telephone Encounter (Signed)
Patient cancelled follow up appointment and will call back to reschedule.

## 2017-01-16 NOTE — Telephone Encounter (Signed)
Patient is returning your call.    Thanks

## 2017-01-16 NOTE — Telephone Encounter (Signed)
I have closed the encounter.  Ok for the patient to call back at her convenience to schedule.

## 2017-01-16 NOTE — Telephone Encounter (Signed)
Spoke with patient about lab results done 01/10/17

## 2017-01-20 ENCOUNTER — Ambulatory Visit: Payer: Medicare Other | Admitting: Obstetrics and Gynecology

## 2017-02-07 DIAGNOSIS — D485 Neoplasm of uncertain behavior of skin: Secondary | ICD-10-CM | POA: Diagnosis not present

## 2017-02-07 DIAGNOSIS — D225 Melanocytic nevi of trunk: Secondary | ICD-10-CM | POA: Diagnosis not present

## 2017-02-07 DIAGNOSIS — L2489 Irritant contact dermatitis due to other agents: Secondary | ICD-10-CM | POA: Diagnosis not present

## 2017-02-07 DIAGNOSIS — D2262 Melanocytic nevi of left upper limb, including shoulder: Secondary | ICD-10-CM | POA: Diagnosis not present

## 2017-02-08 ENCOUNTER — Encounter: Payer: Self-pay | Admitting: Neurology

## 2017-02-08 ENCOUNTER — Encounter (INDEPENDENT_AMBULATORY_CARE_PROVIDER_SITE_OTHER): Payer: Self-pay

## 2017-02-08 ENCOUNTER — Ambulatory Visit (INDEPENDENT_AMBULATORY_CARE_PROVIDER_SITE_OTHER): Payer: Medicare Other | Admitting: Neurology

## 2017-02-08 VITALS — BP 137/75 | HR 65 | Ht 61.0 in | Wt 155.8 lb

## 2017-02-08 DIAGNOSIS — Z7901 Long term (current) use of anticoagulants: Secondary | ICD-10-CM

## 2017-02-08 DIAGNOSIS — E785 Hyperlipidemia, unspecified: Secondary | ICD-10-CM | POA: Diagnosis not present

## 2017-02-08 DIAGNOSIS — I63312 Cerebral infarction due to thrombosis of left middle cerebral artery: Secondary | ICD-10-CM | POA: Diagnosis not present

## 2017-02-08 DIAGNOSIS — I481 Persistent atrial fibrillation: Secondary | ICD-10-CM

## 2017-02-08 DIAGNOSIS — I4819 Other persistent atrial fibrillation: Secondary | ICD-10-CM

## 2017-02-08 NOTE — Progress Notes (Signed)
[facility] Report   Patient Details  Name: Adrienne [name] MRN: [id number] Date of Birth: 11-15-1927 Age: 83 y.o. PCP: Kathyrn Lass, MD  Vitals:   02/08/17 1338  BP: 122/82  Pulse: (!) 56  Resp: 18  SpO2: 98%  Weight: 155 lb (70.3 kg)  Height: 5' 0.5" (1.537 m)         [name] Eval - 02/08/17 1300      Referral    Referring Provider out pt rehab   Reason for referral Inactivity;Obesitity/Overweight;Orthopedic;Stroke   Program Start Date 02/08/17     Measurement   Waist Circumference 37 inches   Hip Circumference 43.5 inches   Body fat 48.5 percent     Information for Trainer   Goals "to imporve my balance, slim down abdominal area, strength in arms and legs"   Current Exercise none   Orthopedic Concerns "back fracture (mid back), Bilat knee replacements"   Pertinent Medical History Stroke 5/17, RT mastectomy, afib, swelling in ankles, shob.   Current Barriers "motivation"   Restrictions/Precautions Fall risk   Medications that affect exercise Beta blocker     Timed Up and Go (TUGS)   Timed Up and Go Moderate risk 10-12 seconds  12.65     Mobility and Daily Activities   I find it easy to walk up or down two or more flights of stairs. 1   I have no trouble taking out the trash. 3   I do housework such as vacuuming and dusting on my own without difficulty. 2   I can easily lift a gallon of milk (8lbs). 3   I can easily walk a mile. 1   I have no trouble reaching into high cupboards or reaching down to pick up something from the floor. 3   I do not have trouble doing out-door work such as Armed forces logistics/support/administrative officer, raking leaves, or gardening. 2     Mobility and Daily Activities   I feel younger than my age. 3   I feel independent. 2   I feel energetic. 3   I live an active life.  3   I feel strong. 2   I feel healthy. 3   I feel active as other people my age. 3     How fit and strong are you.   Fit and Strong Total Score 34     Past Medical  History:  Diagnosis Date  . Afib ([place])   . Arthritis of ankle   . CAD (coronary artery disease)   . Cancer [facility]) 2012   breast cancer-right  . Compression fracture of T12 vertebra (HCC)   . Diverticulosis   . Edema   . Elevated uric acid in blood   . GI bleed 12/2015  . Hyperlipidemia   . Hypertension   . Long-term (current) use of anticoagulants   . Moderate mitral regurgitation   . Pulmonary HTN ([place])   . Stroke ([place]) 01/25/2016  . TIA (transient ischemic attack) 09/2011  . Urinary incontinence    Past Surgical History:  Procedure Laterality Date  . APPENDECTOMY    . BREAST SURGERY  2012   Rt.mastectomy--[place], [place]  2013  . CATARACT EXTRACTION, BILATERAL    . DILATION AND CURETTAGE OF UTERUS     in her early 79's for DUB  . ESOPHAGOGASTRODUODENOSCOPY Left 01/08/2016   Procedure: ESOPHAGOGASTRODUODENOSCOPY (EGD);  Surgeon: Arta Silence, MD;  Location: [facility];  Service: Endoscopy;  Laterality: Left;  .  GALLBLADDER SURGERY    . MASTECTOMY Right   . RADIOLOGY WITH ANESTHESIA N/A 01/25/2016   Procedure: RADIOLOGY WITH ANESTHESIA;  Surgeon: Luanne Bras, MD;  Location: [facility];  Service: Radiology;  Laterality: N/A;  . REPLACEMENT TOTAL KNEE BILATERAL    . TONSILLECTOMY AND ADENOIDECTOMY     as a child   History  Smoking Status  . Former Smoker  . Types: Cigarettes  Smokeless Tobacco  . Never Used    [name] and her husband have joined the [employer] together and will start weekly classes on Wed. 02/13/17.    [name] 02/08/2017, 1:42 PM

## 2017-02-08 NOTE — Progress Notes (Signed)
STROKE NEUROLOGY FOLLOW UP NOTE  NAME: Adrienne [name] DOB: 1928/09/04  REASON FOR VISIT: stroke follow up HISTORY FROM: pt and husband and chart  Today we had the pleasure of seeing Adrienne [name] in follow-up at our Neurology Clinic. Pt was accompanied by husband.   History Summary Adrienne [name] is a 83 y.o. female with history of atrial fibrillation on coumadin with GIB 3 weeks PTA was admitted on 01/25/16 for R sided weakness and receptive phasia following a fall. She did not receive IV t-PA per documenation due to hx GIB. INR 1.5 on arrival. CTA showed L MCA bifurcation occlusion, left ICA siphon moderate plaque, and intracranial stenosis b/l ACA, R MCA, L PCA branches. CTP showed penumbra. She was taken to IR with complete TICI 3 revascularization of L MCA with Solitaire and IA integrelin. MRI showed only a small volume of the left MCA territory infarct. EF 60-65% LDL 80 and A1C 5.6. Her coumadin switched to eliquis. H&H stable no GIB during admission. She was discharged to CIR with eliquis and lipitor 20.   04/06/16 follow up - the patient has been doing well. She had full recovery and d/c from CIR to home. She followed with cardiology and lipitor stopped due to mild side effects of myalgia. On cardizem and metoprolol of rate control. On eliquis without bleeding side effects. BP 130/80.     08/11/16 follow up - the pt has been doing well. No recurrent stroke like symptoms. BP at home well controlled, 125-145/50-65. Finished PT/OT. Has follow up with PCP next week and cardiology Dr Marlou Porch in 09/2015. Not on statin but red yeast. BP 120/60  Interval History During the interval time, pt has been doing well, no stroke like symptoms. She had one episode of small varicose vein bleeding after trauma, went to ED, had compression and bleeding stopped. Did not require stitches. Other than that, she has been no complains. She followed with Dr. Marlou Porch, and put back on lipitor 10 and lipid panel  repeat was excellent. BP today 135/75.    REVIEW OF SYSTEMS: Full 14 system review of systems performed and notable only for those listed below and in HPI above, all others are negative:  Constitutional:   Cardiovascular: leg swelling Ear/Nose/Throat:   Skin:  Eyes:   Respiratory:  SOB Gastroitestinal:   Genitourinary: incontinence of bladder Hematology/Lymphatic:  Bruise easily Endocrine:  Musculoskeletal:   Allergy/Immunology:   Neurological:   Psychiatric:  Sleep:   The following represents the patient's updated allergies and side effects list: Allergies  Allergen Reactions  . Valium [Diazepam] Other (See Comments)    HYPERACTIVITY   . Amiodarone Hcl [Amiodarone] Other (See Comments)    AFIB  . Compazine [Prochlorperazine] Other (See Comments)    HYPERACTIVITY   . Other Other (See Comments)    "Kidney dye"--patient states this gave her severe shakes/chills  . Thorazine [Chlorpromazine] Other (See Comments)    HYPERACTIVITY   . Zometa [Zoledronic Acid] Other (See Comments)    PROBLEMS WITH EYES  . Ciprofloxacin Swelling    "swelling" in Rt.elbow    The neurologically relevant items on the patient's problem list were reviewed on today's visit.  Neurologic Examination  A problem focused neurological exam (12 or more points of the single system neurologic examination, vital signs counts as 1 point, cranial nerves count for 8 points) was performed.  Blood pressure 137/75, pulse 65, height 5\' 1"  (1.549 m), weight 155 lb 12.8 oz (70.7 kg), last menstrual period 09/12/1973.  General - Well nourished, well developed, in no apparent distress.  Ophthalmologic - Sharp disc margins OU.  Cardiovascular - irregularly irregular heart rate and rhythm.  Mental Status -  Level of arousal and orientation to time, place, and person were intact. Language including expression, naming, repetition, comprehension was assessed and found intact. Fund of Knowledge was assessed and was  intact.  Cranial Nerves II - XII - II - Visual field intact OU. III, IV, VI - Extraocular movements intact. V - Facial sensation intact bilaterally. VII - Facial movement intact bilaterally. VIII - Hearing & vestibular intact bilaterally. X - Palate elevates symmetrically. XI - Chin turning & shoulder shrug intact bilaterally. XII - Tongue protrusion intact.  Motor Strength - The patient's strength was normal in all extremities and pronator drift was absent.  Bulk was normal and fasciculations were absent.   Motor Tone - Muscle tone was assessed at the neck and appendages and was normal.  Reflexes - The patient's reflexes were 1+ in all extremities and she had no pathological reflexes.  Sensory - Light touch, temperature/pinprick were assessed and were normal.    Coordination - The patient had normal movements in the hands and feet with no ataxia or dysmetria.  Tremor was absent.  Gait and Station - The patient's transfers, posture, gait, station, and turns were observed as normal.   Data reviewed: I personally reviewed the images and agree with the radiology interpretations.  Ct Head Wo Contrast 01/25/2016 Early changes of acute posterior left MCA infarct. No parenchymal hematoma. 01/25/2016 Atrophy with small vessel disease in the periventricular white matter. Prior infarcts as noted above. No acute appearing infarct is evident. No hemorrhage or mass effect. ADDENDUM Left hemisphere ASPECTS score = 9; small area of cortical hypodensity in the left M2 as seen on series 21, image 14.had given verbal preliminary ASPECTS score of "10"   Ct Angio Head & Neck W/cm &/or Wo/cm 01/25/2016 1. Positive for Emergent Large Vessel Occlusion at The left MCA bifurcation. Abnormal left MCA perfusion with noncontrast head CT and CT Brain Perfusion parameters favorable for clot retrieval. 2. Superimposed atherosclerotic stenosis at the left ICA origin (60-65%) and left supraclinoid ICA siphon  (moderate) related to calcified plaque. 3. Superimposed mild to moderate second and third order branch intracranial atherosclerosis including in the bilateral ACA, right MCA, and left PCA branches. 4. Right carotid bifurcation calcified plaque without stenosis.   Ct C-spine No Charge 01/25/2016 Multilevel degenerative change without acute abnormality.   Ct Cerebral Perfusion W/cm 01/25/2016 1. Positive for Emergent Large Vessel Occlusion at The left MCA bifurcation. Abnormal left MCA perfusion with noncontrast head CT and CT Brain Perfusion parameters favorable for clot retrieval.   Cerebral angiogram 01/25/2016 bilateral common carotid arteriograms followed by complete revascularization of Occluded Lt MCA With x 1 pass with Solitaire 4 mmx 40 mm Retrieval device and 4 mg of superselective IA integrelin with TICI 3 revascularization.  MRI Brain Wo Contrast 01/27/2016 Only a small volume of left MCA territory infarction occurred, primarily along the left sylvian fissure. No hemorrhage or mass effect. 2. Occasional other punctate areas of restricted diffusion over both superior convexities, probably neuro-intervention related.  CXR 01/26/2016 No active disease. Probable chronic mild interstitial prominence. Endotracheal and NG tube in place. No pneumothorax.  2D echocardiogram - Left ventricle: The cavity size was normal. Systolic function was  normal. The estimated ejection fraction was in the range of 60% to 65%. Wall motion was normal; there were no regional wall  motion abnormalities. The study is not technically sufficient to allow evaluation of LV diastolic function. - Aortic valve: Valve mobility was mildly restricted. Transvalvular velocity was within the normal range. There was no stenosis. There was trivial regurgitation. - Mitral valve: Calcified annulus. Transvalvular velocity was within the normal range. There was no evidence for stenosis. There was trivial regurgitation. - Left  atrium: The atrium was moderately dilated. - Right ventricle: The cavity size was normal. Wall thickness was normal. Systolic function was normal. - Atrial septum: No defect or patent foramen ovale was identified by color flow Doppler. - Tricuspid valve: There was mild regurgitation. - Pulmonary arteries: Systolic pressure was within the normal range. PA peak pressure: 29 mm Hg (S).  Component     Latest Ref Rng & Units 01/26/2016  Cholesterol     0 - 200 mg/dL 143  Triglycerides     <150 mg/dL 101  HDL Cholesterol     >40 mg/dL 43  Total CHOL/HDL Ratio     RATIO 3.3  VLDL     0 - 40 mg/dL 20  LDL (calc)     0 - 99 mg/dL 80  Hemoglobin A1C     4.8 - 5.6 % 5.6  Mean Plasma Glucose     mg/dL 114    Assessment: As you may recall, she is a 83 y.o. Caucasian female with PMH of  atrial fibrillation on coumadin with GIB 3 weeks PTA was admitted on 01/25/16 for R sided weakness and receptive aphasia following a fall. She did not receive IV t-PA per documenation due to hx GIB. INR 1.5 on arrival. CTA showed L MCA bifurcation occlusion, left ICA siphon moderate plaque, and intracranial stenosis b/l ACA, R MCA, L PCA branches. CTP showed penumbra. She was taken to IR with complete TICI 3 revascularization of L MCA. MRI showed only a small volume of the left MCA territory infarct. EF 60-65% LDL 80 and A1C 5.6. Her coumadin switched to eliquis. H&H stable no GIB. She was discharged to CIR with eliquis and lipitor 20. She had full recovery and d/c from CIR to home. She followed with cardiology and lipitor stopped due to mild side effects of myalgia. On cardizem and metoprolol of rate control. On eliquis without bleeding side effects except one episode of small varicose vein bleeding not requiring intervention. BP well controlled. On lipitor 10 now and tolerating well.   Plan:  - continue eliquis 5mg  bid and lipitor for stroke prevention. - check BP at home and record.  - follow up with cardiology Dr.  Marlou Porch as scheduled - Follow up with your primary care physician for stroke risk factor modification. Recommend maintain blood pressure goal <130/80, diabetes with hemoglobin A1c goal below 7.0% and lipids with LDL cholesterol goal below 70 mg/dL.  - healthy diet and regular exercise and avoid fall - follow up in one year.  No orders of the defined types were placed in this encounter.   Meds ordered this encounter  Medications  . triamcinolone cream (KENALOG) 0.1 %  . calcium-vitamin D 250-100 MG-UNIT tablet    Sig: Take 1 tablet by mouth 2 (two) times daily.    Patient Instructions  - continue eliquis 5mg  bid and lipitor for stroke prevention. - check BP at home and record.  - follow up with cardiology Dr. Marlou Porch as scheduled - Follow up with your primary care physician for stroke risk factor modification. Recommend maintain blood pressure goal <130/80, diabetes with hemoglobin A1c goal below  7.0% and lipids with LDL cholesterol goal below 70 mg/dL.  - healthy diet and regular exercise and avoid fall - follow up in one year.   Rosalin Hawking, MD PhD [facility] [address] [place], [place] [address] [phone]

## 2017-02-08 NOTE — Patient Instructions (Addendum)
-   continue eliquis 5mg  bid and lipitor for stroke prevention. - check BP at home and record.  - follow up with cardiology Dr. Marlou Porch as scheduled - Follow up with your primary care physician for stroke risk factor modification. Recommend maintain blood pressure goal <130/80, diabetes with hemoglobin A1c goal below 7.0% and lipids with LDL cholesterol goal below 70 mg/dL.  - healthy diet and regular exercise and avoid fall - follow up in one year.

## 2017-02-09 DIAGNOSIS — N3020 Other chronic cystitis without hematuria: Secondary | ICD-10-CM | POA: Diagnosis not present

## 2017-02-09 DIAGNOSIS — N8111 Cystocele, midline: Secondary | ICD-10-CM | POA: Diagnosis not present

## 2017-02-17 ENCOUNTER — Other Ambulatory Visit: Payer: Self-pay | Admitting: *Deleted

## 2017-02-17 MED ORDER — DILTIAZEM HCL 60 MG PO TABS: 60.0000 mg | ORAL_TABLET | Freq: Three times a day (TID) | ORAL | 1 refills | Status: DC

## 2017-02-17 MED ORDER — METOPROLOL TARTRATE 100 MG PO TABS: 100.0000 mg | ORAL_TABLET | Freq: Two times a day (BID) | ORAL | 1 refills | Status: DC

## 2017-02-17 NOTE — Progress Notes (Signed)
[facility] [employer] PREP Weekly Session   Patient Details  Name: Adrienne [name] MRN: [id number] Date of Birth: 1928/03/09 Age: 83 y.o. PCP: Kathyrn Lass, MD  Vitals:        [name] [facility] sees[facility] - 02/17/17 1200      Weekly Session   Topic Discussed Stress management and problem solving   Minutes exercised this week --  none reported   Classes attended to date 1     This was from class on 02/13/17: Fun things you did since last meeting:"watched-"The Crown" Things you are grateful for:"good doctors, husband, and family"   [name] 02/17/2017, 12:07 PM

## 2017-02-22 NOTE — Progress Notes (Signed)
[facility] [employer] PREP Weekly Session   Patient Details  Name: Adrienne [name] MRN: [id number] Date of Birth: 03-10-1928 Age: 83 y.o. PCP: Kathyrn Lass, MD  Vitals:   02/22/17 0910  Weight: 155 lb 6.4 oz (70.5 kg)        [name] [facility] - 02/22/17 0900      Weekly Session   Topic Discussed Other  guest speaker   Minutes exercised this week 55 minutes  10cardio/15strength/51flexibility   Classes attended to date 2     Fun things you did since last meeting:"watched"Crown" Things you are grateful for:"getting some yard work done for [employer] 02/22/2017, 9:11 AM

## 2017-03-06 NOTE — Progress Notes (Signed)
[facility] [employer] PREP Weekly Session   Patient Details  Name: Adrienne [name] MRN: [id number] Date of Birth: 07/18/1928 Age: 83 y.o. PCP: Kathyrn Lass, MD  Vitals:   03/06/17 1138  Weight: 154 lb 12.8 oz (70.2 kg)        [name] [facility] - 03/06/17 1100      Weekly Session   Topic Discussed Importance of resistance training   Minutes exercised this week 120 minutes  45cardio/45 strength/84flexibility   Classes attended to date 3     Fun things you did since last meeting:"watching crown" Things you are grateful for:"family & air conditioning repair" Barriers:"air conditioning off"  [name] 03/06/2017, 11:39 AM

## 2017-03-08 NOTE — Progress Notes (Signed)
[facility] [employer] PREP Weekly Session   Patient Details  Name: Adrienne [name] MRN: [id number] Date of Birth: Jan 25, 1928 Age: 83 y.o. PCP: Kathyrn Lass, MD  Vitals:   03/08/17 1004  Weight: 155 lb (70.3 kg)        [name] [facility] - 03/08/17 1000      Weekly Session   Topic Discussed Expectations and non-scale victories   Minutes exercised this week 120 minutes  60cardio/40strength/39flexibility   Classes attended to date 4     Fun things you did since last meeting:"dinner meeting w/friends" Things you are grateful for:"family, our home and provisions-answered prayers" Barriers:"motivation"   [name] 03/08/2017, 10:04 AM

## 2017-03-17 ENCOUNTER — Other Ambulatory Visit: Payer: Self-pay | Admitting: *Deleted

## 2017-03-17 MED ORDER — APIXABAN 5 MG PO TABS: 5.0000 mg | ORAL_TABLET | Freq: Two times a day (BID) | ORAL | 1 refills | Status: DC

## 2017-03-17 NOTE — Telephone Encounter (Signed)
Age 83 years Wt 70.7kg 02/08/2017 Saw Dr Marlou Porch on 10/12/2016 12/24/2016 SrCr 0.97 12/24/2016 Hgb 13.7 HCT 41.0 Refill done for Eliquis 5mg  q 12 hours as requested

## 2017-03-27 NOTE — Progress Notes (Signed)
[facility] [employer] PREP Weekly Session   Patient Details  Name: Adrienne [name] MRN: [id number] Date of Birth: Jul 22, 1928 Age: 83 y.o. PCP: Kathyrn Lass, MD  Vitals:   03/20/17 1120  Weight: 153 lb (69.4 kg)        [name] [facility] - 03/27/17 1100      Weekly Session   Topic Discussed Other  portion control   Minutes exercised this week --  "silver sneakers"   Classes attended to date 5     Fun things you did since last meeting:"4th of July fireworks w/our great grandsons!" Things you are grateful for:"family-home and the Lord's provisions" Barriers:"motivation still a struggle"  [name] 03/27/2017, 11:21 AM

## 2017-03-28 ENCOUNTER — Encounter: Payer: Self-pay | Admitting: Cardiology

## 2017-03-28 ENCOUNTER — Ambulatory Visit (INDEPENDENT_AMBULATORY_CARE_PROVIDER_SITE_OTHER): Payer: Medicare Other | Admitting: Cardiology

## 2017-03-28 ENCOUNTER — Encounter (INDEPENDENT_AMBULATORY_CARE_PROVIDER_SITE_OTHER): Payer: Self-pay

## 2017-03-28 VITALS — BP 122/62 | HR 85 | Ht 60.0 in | Wt 152.6 lb

## 2017-03-28 DIAGNOSIS — I4819 Other persistent atrial fibrillation: Secondary | ICD-10-CM

## 2017-03-28 DIAGNOSIS — I2510 Atherosclerotic heart disease of native coronary artery without angina pectoris: Secondary | ICD-10-CM | POA: Diagnosis not present

## 2017-03-28 DIAGNOSIS — I2583 Coronary atherosclerosis due to lipid rich plaque: Secondary | ICD-10-CM

## 2017-03-28 DIAGNOSIS — Z7901 Long term (current) use of anticoagulants: Secondary | ICD-10-CM | POA: Diagnosis not present

## 2017-03-28 DIAGNOSIS — I63312 Cerebral infarction due to thrombosis of left middle cerebral artery: Secondary | ICD-10-CM

## 2017-03-28 DIAGNOSIS — I481 Persistent atrial fibrillation: Secondary | ICD-10-CM

## 2017-03-28 DIAGNOSIS — Z8673 Personal history of transient ischemic attack (TIA), and cerebral infarction without residual deficits: Secondary | ICD-10-CM

## 2017-03-28 DIAGNOSIS — I272 Pulmonary hypertension, unspecified: Secondary | ICD-10-CM | POA: Diagnosis not present

## 2017-03-28 NOTE — Progress Notes (Signed)
Cardiology Office Note   Date:  03/28/2017   ID:  Adrienne [name], DOB 1928-06-10, MRN [id number]  PCP:  Kathyrn Lass, MD  Cardiologist:   Candee Furbish, MD     History of Present Illness: Adrienne [name] is a 83 y.o. female (I see her husband, from [place]) here for follow up of atrial fibrillation, pulmonary hypertension, coronary artery disease, mitral regurgitation on chronic anticoagulation patient of Dr. Kathyrn Lass.  Admitted in April 2017 with intermittent bloody stools. Anticoagulation was placed on hold. GI work up unremarkable (Hiatal hernia and diverticulosis). On Jan 25, 2016 had right side weakness and was found in bathroom by husband. Coumadin was restarted about a week prior to this, INR 1.5.Left MCA infarct. Interventional radiology. Transitioned to Eliquis on May 18.    Atrial fibrillation: In the past she has tried amiodarone but now has this listed under allergies. Currently under rate control strategy. Taking diltiazem, metoprolol. Had minor stroke years ago on Zometa in 2013 breast cancer. Right breast.   Coronary artery disease: Detected on catheterization in February 2013, nonobstructive. Echo on 09/2014 showed moderate mitral regurgitation. Normal pulmonary pressures.  Hyperlipidemia-has had aversion to statins in the past. Used to take [employer].  She has had prior visits with Dr. Sabra Heck for dizziness. Vertigo-like symptoms. During that visit she was concerned about cardiology referral for management of A. fib and Coumadin.  Pulmonary hypertension: secondary, been steady for years. In fact last echocardiogram reassuring with no significant elevation pressures.  Having SOB. Chronic. Dyspnea  Past Medical History:  Diagnosis Date  . Afib ([place])   . Arthritis of ankle   . CAD (coronary artery disease)   . Cancer [facility]) 2012   breast cancer-right  . Compression fracture of T12 vertebra ([facility])   . Diverticulosis   . Edema   . Elevated uric acid in blood    . GI bleed 12/2015  . Hyperlipidemia   . Hypertension   . Long-term (current) use of anticoagulants   . Moderate mitral regurgitation   . Pulmonary HTN ([place])   . Stroke ([place]) 01/25/2016  . TIA (transient ischemic attack) 09/2011  . Urinary incontinence     Past Surgical History:  Procedure Laterality Date  . APPENDECTOMY    . BREAST SURGERY  2012   Rt.mastectomy[place]  2013  . CATARACT EXTRACTION, BILATERAL    . DILATION AND CURETTAGE OF UTERUS     in her early 53's for DUB  . ESOPHAGOGASTRODUODENOSCOPY Left 01/08/2016   Procedure: ESOPHAGOGASTRODUODENOSCOPY (EGD);  Surgeon: Arta Silence, MD;  Location: [facility];  Service: Endoscopy;  Laterality: Left;  . GALLBLADDER SURGERY    . MASTECTOMY Right   . RADIOLOGY WITH ANESTHESIA N/A 01/25/2016   Procedure: RADIOLOGY WITH ANESTHESIA;  Surgeon: Luanne Bras, MD;  Location: [facility];  Service: Radiology;  Laterality: N/A;  . REPLACEMENT TOTAL KNEE BILATERAL    . TONSILLECTOMY AND ADENOIDECTOMY     as a child     Current Outpatient Prescriptions  Medication Sig Dispense Refill  . acetaminophen (TYLENOL) 500 MG tablet Take 500-1,000 mg by mouth every 12 (twelve) hours as needed (pain).     Marland Kitchen amoxicillin (AMOXIL) 500 MG capsule 500 mg. Patient takes 4 tablets by mouth once prior to dental procedures    . apixaban (ELIQUIS) 5 MG TABS tablet Take 1 tablet (5 mg total) by mouth 2 (two) times daily. 180 tablet 1  . atorvastatin (LIPITOR) 10 MG tablet Take 10  mg by mouth daily.    . calcium-vitamin D 250-100 MG-UNIT tablet Take 1 tablet by mouth 2 (two) times daily.    . cephALEXin (KEFLEX) 250 MG capsule Take 250 mg by mouth daily.    . cholecalciferol (VITAMIN D) 1000 units tablet Take 1,000 Units by mouth daily.    Marland Kitchen dextromethorphan-guaiFENesin (MUCINEX DM) 30-600 MG 12hr tablet Take 1 tablet by mouth 2 (two) times daily as needed.     . diltiazem (CARDIZEM) 60 MG tablet Take 1 tablet (60 mg  total) by mouth 3 (three) times daily. 270 tablet 1  . furosemide (LASIX) 20 MG tablet Take 20 mg by mouth daily.    . metoprolol tartrate (LOPRESSOR) 100 MG tablet Take 1 tablet (100 mg total) by mouth 2 (two) times daily. 180 tablet 1  . Multiple Vitamins-Minerals (MULTIVITAMIN ADULT PO) Take 1 tablet by mouth daily.     Marland Kitchen omeprazole (PRILOSEC) 40 MG capsule Take 40 mg by mouth every other day.     Marland Kitchen PROAIR HFA 108 (90 Base) MCG/ACT inhaler Inhale 1-2 puffs into the lungs every 6 (six) hours as needed for wheezing or shortness of breath.     . triamcinolone cream (KENALOG) 0.1 % Apply 1 application topically daily.     . vitamin C (ASCORBIC ACID) 500 MG tablet Take 1,000 mg by mouth daily. Takes 2 tabs daily     No current facility-administered medications for this visit.     Allergies:   Valium [diazepam]; Amiodarone hcl [amiodarone]; Compazine [prochlorperazine]; Other; Thorazine [chlorpromazine]; Zometa [zoledronic acid]; and Ciprofloxacin    Social History:  The patient  reports that she has quit smoking. Her smoking use included Cigarettes. She has never used smokeless tobacco. She reports that she does not drink alcohol or use drugs.   Family History:  The patient's family history includes Asthma in her mother; Atrial fibrillation in her sister; CVA in her father; Cancer in her daughter and maternal grandmother; Pneumonia in her brother.    ROS:  Please see the history of present illness.   Otherwise, review of systems are positive for no bleeding, no syncope, no orthopnea, no PND, no chest pain, no new strokelike symptoms.   All other systems are reviewed and negative.    PHYSICAL EXAM: VS:  BP 122/62   Pulse 85   Ht 5' (1.524 m)   Wt 152 lb 9.6 oz (69.2 kg)   LMP 09/12/1973 (Approximate) Comment: spotting  SpO2 91%   BMI 29.80 kg/m  , BMI Body mass index is 29.8 kg/m. GEN: Well nourished, well developed, in no acute distress  HEENT: normal  Neck: no JVD, carotid bruits,  or masses Cardiac: irregularly irregular;  soft systolic murmur left lower sternal border no  rubs, or gallops  Respiratory:  clear to auscultation bilaterally, normal work of breathing GI: soft, nontender, nondistended, + BS MS: no deformity or atrophy  Skin: warm and dry, no rash, 2-3+ chronic lower extremity edema left greater than right with chronic venous insufficiency changes. Neuro:  Strength and sensation are intact Psych: euthymic mood, full affect   EKG:  EKG 06/10/15-atrial fibrillation heart rate 64, no other abnormalities. Personally viewed-prior Prior EKG from 10/29/11 shows atrial fibrillation with heart rate 110 bpm, no other abnormalities.  ECHO: 06/18/15  - Left ventricle: The cavity size was mildly dilated. Systolic  function was normal. The estimated ejection fraction was in the  range of 55% to 60%. Wall motion was normal; there were no  regional  wall motion abnormalities. - Aortic valve: Moderately calcified annulus. Trileaflet; normal  thickness, mildly calcified leaflets. Mobility was not  restricted. - Mitral valve: Calcified annulus. - Right ventricle: The cavity size was normal. Wall thickness was  normal. Systolic function was normal. - Tricuspid valve: There was mild regurgitation. - Pulmonic valve: Transvalvular velocity was within the normal  range. There was no evidence for stenosis. - Inferior vena cava: The vessel was dilated. The respirophasic  diameter changes were in the normal range (>= 50%), consistent  with elevated central venous pressure.  ECHO 01/26/16 - Left ventricle: The cavity size was normal. Systolic function was   normal. The estimated ejection fraction was in the range of 60%   to 65%. Wall motion was normal; there were no regional wall   motion abnormalities. The study is not technically sufficient to   allow evaluation of LV diastolic function. - Aortic valve: Valve mobility was mildly restricted. Transvalvular    velocity was within the normal range. There was no stenosis.   There was trivial regurgitation. - Mitral valve: Calcified annulus. Transvalvular velocity was   within the normal range. There was no evidence for stenosis.   There was trivial regurgitation. - Left atrium: The atrium was moderately dilated. - Right ventricle: The cavity size was normal. Wall thickness was   normal. Systolic function was normal. - Atrial septum: No defect or patent foramen ovale was identified   by color flow Doppler. - Tricuspid valve: There was mild regurgitation. - Pulmonary arteries: Systolic pressure was within the normal   range. PA peak pressure: 29 mm Hg (S).  Recent Labs: 07/19/2016: B Natriuretic Peptide 258.2 12/24/2016: BUN 17; Creatinine, Ser 0.97; Hemoglobin 13.7; Platelets 280; Potassium 3.9; Sodium 139 01/10/2017: ALT 7    Lipid Panel    Component Value Date/Time   CHOL 121 01/10/2017 0849   TRIG 76 01/10/2017 0849   HDL 54 01/10/2017 0849   CHOLHDL 2.2 01/10/2017 0849   CHOLHDL 3.3 01/26/2016 0300   VLDL 20 01/26/2016 0300   LDLCALC 52 01/10/2017 0849      Wt Readings from Last 3 Encounters:  03/28/17 152 lb 9.6 oz (69.2 kg)  03/20/17 153 lb (69.4 kg)  03/08/17 155 lb (70.3 kg)      Other studies Reviewed: Additional studies/ records that were reviewed today include: office notes, labs, EKG, echoew of the above records demonstrates: as above   ASSESSMENT AND PLAN:  1. Permanent atrial fibrillation -Since 2013. Failed amiodarone. -This patients CHA2DS2-VASc Score and unadjusted Ischemic Stroke Rate (% per year) is equal to 9.7 % stroke rate/year from a score of 6  Above score calculated as 1 point each if present [CHF, HTN, DM, Vascular=MI/PAD/Aortic Plaque, Age if 65-74, or Female] Above score calculated as 2 points each if present [Age > 75, or Stroke/TIA/TE]  -Continuing with chronic anti-coagulation. Eliquis now. Off coumadin. Stroke (INR 1.5).  -Under good rate  control with heart rates between 60 and 90 mostly. Continue with metoprolol and diltiazem. She takes split dosing of this medication. Discussed Eliquis. -She had a stroke, left MCA when she had to be off of anticoagulation secondary to gastrointestinal bleeding.   - Currently stable. Had some bleeding of varicose, ER, pressure, stopped. Dyspnea. Noted.   2. Coronary artery disease -Nonobstructive CAD, she believes normal cardiac catheterization in 2013 in [place].  3. Pulmonary hypertension -She had been diagnosis with this previously in [place] and was getting echocardiograms every 6 months. Echocardiogram here showed normal pulmonary  pressures. Reassurance.  4. Hyperlipidemia -LDL 80. Prior stroke. Deserves statin. Agree with neuro. we will start atorvastatin 10 mg once a day and recheck lipid panel in 3 months. She has been anti-statin in the past. She showed me a article from Dr. Talmage Coin regarding co Q 10 previously. She is now taking the atorvastatin alone with no red yeast rice. LDL in the 50s. Excellent.  5.Chronic anticoagulation   - now on Eliquis   6. History of breast cancer -Prior mastectomy right-sided.  7. History of stroke -During breast cancer treatment after receiving Zometa as well as during a period of cessation of coumadin in the setting of GI bleeding. She is on chronic anticoagulation, now Eliquis. Marland Kitchen  8. Chronic lower extremity edema/venous insufficiency -Prior Lasix,  Now off with 3 gout attacks.  Encouraged thigh-high stockings. She does not like to wear these in the past. Stable chronic condition.  Current medicines are reviewed at length with the patient today.  The patient does not have concerns regarding medicines.  The following changes have been made:  no change  Labs/ tests ordered today include:   Orders Placed This Encounter  Procedures  . ECHOCARDIOGRAM COMPLETE     Disposition:   FU with Hartley Wyke in 6 months  Signed, Candee Furbish, MD    03/28/2017 10:19 AM    [facility] [address], [place], [place]  [address] Phone: [phone]; Fax: [phone]

## 2017-03-28 NOTE — Patient Instructions (Signed)
Medication Instructions:  The current medical regimen is effective;  continue present plan and medications.  Testing/Procedures: Your physician has requested that you have an echocardiogram. Echocardiography is a painless test that uses sound waves to create images of your heart. It provides your doctor with information about the size and shape of your heart and how well your heart's chambers and valves are working. This procedure takes approximately one hour. There are no restrictions for this procedure.  Follow-Up: Follow up in 6 months with Dr. Skains.  You will receive a letter in the mail 2 months before you are due.  Please call us when you receive this letter to schedule your follow up appointment.  If you need a refill on your cardiac medications before your next appointment, please call your pharmacy.  Thank you for choosing [facility]!!       

## 2017-03-31 NOTE — Progress Notes (Signed)
[facility] [employer] PREP Weekly Session   Patient Details  Name: Adrienne [name] MRN: [id number] Date of Birth: Nov 17, 1927 Age: 83 y.o. PCP: Kathyrn Lass, MD  Vitals:   03/31/17 0857  Weight: 152 lb 3.2 oz (69 kg)        [name] [facility] - 03/31/17 0800      Weekly Session   Topic Discussed Finding support   Minutes exercised this week 60 minutes  30cardio/30strength   Classes attended to date 6     Fun things you did since last meeting:"visited w/grandson and 2 great-grand daughters on Saturday" Things you are grateful for:"family" Nutrition celebrations for the week:"less ice cream this week" Barriers:"motiviation"  [name] 03/31/2017, 8:59 AM

## 2017-04-04 ENCOUNTER — Ambulatory Visit (HOSPITAL_COMMUNITY): Payer: Medicare Other | Attending: Cardiology

## 2017-04-04 ENCOUNTER — Other Ambulatory Visit: Payer: Self-pay

## 2017-04-04 DIAGNOSIS — I481 Persistent atrial fibrillation: Secondary | ICD-10-CM

## 2017-04-04 DIAGNOSIS — I088 Other rheumatic multiple valve diseases: Secondary | ICD-10-CM | POA: Insufficient documentation

## 2017-04-04 DIAGNOSIS — I4891 Unspecified atrial fibrillation: Secondary | ICD-10-CM | POA: Insufficient documentation

## 2017-04-04 DIAGNOSIS — I272 Pulmonary hypertension, unspecified: Secondary | ICD-10-CM | POA: Diagnosis not present

## 2017-04-04 DIAGNOSIS — I4819 Other persistent atrial fibrillation: Secondary | ICD-10-CM

## 2017-04-04 MED ORDER — PERFLUTREN LIPID MICROSPHERE
1.0000 mL | INTRAVENOUS | Status: AC | PRN
  Administered 2017-04-04: 1 mL via INTRAVENOUS

## 2017-04-05 ENCOUNTER — Telehealth: Payer: Self-pay | Admitting: Cardiology

## 2017-04-05 NOTE — Telephone Encounter (Signed)
Release of Information mailed to pt home address.  °

## 2017-04-10 NOTE — Progress Notes (Signed)
[facility] [employer] PREP Weekly Session   Patient Details  Name: Adrienne [name] MRN: [id number] Date of Birth: 05/05/1928 Age: 83 y.o. PCP: Kathyrn Lass, MD  Vitals:   04/10/17 1132  Weight: 153 lb 9.6 oz (69.7 kg)        [name] [facility] sees[facility] - 04/10/17 1100      Weekly Session   Topic Discussed Calorie breakdown   Minutes exercised this week 150 minutes  60cardio/60strength/59fexibility   Classes attended to date 7     Fun things you did since last meeting:"Dinner group met last night" Things you are grateful for:"fabrics I chose for reupholstering are available after 2 upsets!" Nutrition celebrations:"eating some smaller portions" Barriers:"Motivation"  [name]7/30/2018, 11:33 AM

## 2017-04-19 NOTE — Progress Notes (Signed)
[facility] PREP Weekly Session   Patient Details  Name: Irine Heminger MRN: [id number] Date of Birth: 1928-03-01 Age: 83 y.o. PCP: Kathyrn Lass, MD  Vitals:   04/19/17 1332  Weight: 152 lb (68.9 kg)        [name] [facility] sees[facility] - 04/19/17 1300      Weekly Session   Topic Discussed Hitting roadblocks   Minutes exercised this week 195 minutes  75cardio/60strength/74flexibility   Classes attended to date 8     Fun things you did since last meeting:"got fabrics for chairs to be upholstered" Things you are grateful for:"good health I have" Nutrition celebrations:"smaller portions on some things" Barriers:'same, but a little better, encouraged by being able to"   [name] 04/19/2017, 1:33 PM

## 2017-04-28 NOTE — Progress Notes (Signed)
[facility] [employer] PREP Weekly Session   Patient Details  Name: Adrienne [name] MRN: [id number] Date of Birth: [birth year] Age: 83 y.o. PCP: Kathyrn Lass, MD  Vitals:   04/24/17 1239  Weight: 152 lb 9.6 oz (69.2 kg)        [name] [facility] sees[facility] - 04/28/17 1200      Weekly Session   Topic Discussed Healthy eating tips   Minutes exercised this week 240 minutes  90cardio/75strength/53flexibility   Classes attended to date 66      Fun things you did since last meeting:"watch [place] tv" Things you are grateful for:"The Lord's provisions" Nutrition celebrations:"passed up ice cream several nights!  Brown rice rather than white"  [name] 04/28/2017, 12:40 PM

## 2017-05-04 DIAGNOSIS — N952 Postmenopausal atrophic vaginitis: Secondary | ICD-10-CM | POA: Diagnosis not present

## 2017-05-04 DIAGNOSIS — R300 Dysuria: Secondary | ICD-10-CM | POA: Diagnosis not present

## 2017-05-05 DIAGNOSIS — R300 Dysuria: Secondary | ICD-10-CM | POA: Diagnosis not present

## 2017-05-05 NOTE — Progress Notes (Signed)
[facility] [employer] PREP Weekly Session   Patient Details  Name: Adrienne [name] MRN: [id number] Date of Birth: June 13, 1928 Age: 83 y.o. PCP: Kathyrn Lass, MD  Vitals:   05/01/17 1243  Weight: 152 lb 12.8 oz (69.3 kg)        [name] [facility] - 05/05/17 1200      Weekly Session   Topic Discussed Other ways to be active   Minutes exercised this week 270 minutes  90cardio/90strength/90stretch   Classes attended to date 10     Fun things you've done:"[place] on TV" Things you are grateful for:"All the wonderful provisions the Reita Cliche has given" Nurtrition celebrations:"more aware of sugar and salt"  [name] 05/05/2017, 12:43 PM

## 2017-05-12 NOTE — Progress Notes (Signed)
[facility] [employer] PREP Weekly Session   Patient Details  Name: Adrienne [name] MRN: [id number] Date of Birth: 04-07-1928 Age: 83 y.o. PCP: Kathyrn Lass, MD  Vitals:   05/08/17 1004  Weight: 153 lb (69.4 kg)        [name] [facility] sees[facility] - 05/12/17 1000      Weekly Session   Topic Discussed Health habits   Minutes exercised this week 255 minutes  75cardio/90strength/32flexibility   Classes attended to date 59     Fun things you did since last meeting:"watched a favorite" Things you are grateful for:"doctors to help [place]." Nutrition celebrations:"a better diet this week" Barriers:"too busy, missed one exercise session"   [name] 05/12/2017, 10:05 AM

## 2017-05-24 NOTE — Progress Notes (Signed)
[facility] PREP Weekly Session   Patient Details  Name: Adrienne [name] MRN: [id number] Date of Birth: 1928-06-11 Age: 83 y.o. PCP: Kathyrn Lass, MD  Vitals:   05/24/17 1323  Weight: 153 lb (69.4 kg)        [name] [facility] sees[facility] - 05/24/17 1300      Weekly Session   Topic Discussed Restaurant Eating   Minutes exercised this week 195 minutes  75cardio/60strength/81flexibility   Classes attended to date 40     Fun things you did since last meeting:"40th anniversary party for our daughter & SIL" Things you are grateful for:"good health reports from son and sister" Nutrition celebrations:"not as much ice cream this week" Barriers"need to do more"  [name] 05/24/2017, 1:24 PM

## 2017-06-06 DIAGNOSIS — K219 Gastro-esophageal reflux disease without esophagitis: Secondary | ICD-10-CM | POA: Diagnosis not present

## 2017-06-06 DIAGNOSIS — R300 Dysuria: Secondary | ICD-10-CM | POA: Diagnosis not present

## 2017-06-06 DIAGNOSIS — N3000 Acute cystitis without hematuria: Secondary | ICD-10-CM | POA: Diagnosis not present

## 2017-06-06 DIAGNOSIS — Z23 Encounter for immunization: Secondary | ICD-10-CM | POA: Diagnosis not present

## 2017-07-12 DIAGNOSIS — N3020 Other chronic cystitis without hematuria: Secondary | ICD-10-CM | POA: Diagnosis not present

## 2017-07-12 DIAGNOSIS — N8111 Cystocele, midline: Secondary | ICD-10-CM | POA: Diagnosis not present

## 2017-07-31 DIAGNOSIS — R3911 Hesitancy of micturition: Secondary | ICD-10-CM | POA: Diagnosis not present

## 2017-08-08 DIAGNOSIS — R338 Other retention of urine: Secondary | ICD-10-CM | POA: Diagnosis not present

## 2017-08-08 DIAGNOSIS — R102 Pelvic and perineal pain: Secondary | ICD-10-CM | POA: Diagnosis not present

## 2017-08-08 DIAGNOSIS — R300 Dysuria: Secondary | ICD-10-CM | POA: Diagnosis not present

## 2017-08-10 DIAGNOSIS — I2510 Atherosclerotic heart disease of native coronary artery without angina pectoris: Secondary | ICD-10-CM | POA: Diagnosis not present

## 2017-08-10 DIAGNOSIS — Z79899 Other long term (current) drug therapy: Secondary | ICD-10-CM | POA: Diagnosis not present

## 2017-08-10 DIAGNOSIS — Z87891 Personal history of nicotine dependence: Secondary | ICD-10-CM | POA: Insufficient documentation

## 2017-08-10 DIAGNOSIS — Z853 Personal history of malignant neoplasm of breast: Secondary | ICD-10-CM | POA: Diagnosis not present

## 2017-08-10 DIAGNOSIS — Z96653 Presence of artificial knee joint, bilateral: Secondary | ICD-10-CM | POA: Diagnosis not present

## 2017-08-10 DIAGNOSIS — I10 Essential (primary) hypertension: Secondary | ICD-10-CM | POA: Diagnosis not present

## 2017-08-10 DIAGNOSIS — Z7901 Long term (current) use of anticoagulants: Secondary | ICD-10-CM | POA: Diagnosis not present

## 2017-08-10 DIAGNOSIS — R339 Retention of urine, unspecified: Secondary | ICD-10-CM | POA: Diagnosis not present

## 2017-08-11 ENCOUNTER — Encounter (HOSPITAL_COMMUNITY): Payer: Self-pay | Admitting: Emergency Medicine

## 2017-08-11 ENCOUNTER — Emergency Department (HOSPITAL_COMMUNITY)
Admission: EM | Admit: 2017-08-11 | Discharge: 2017-08-11 | Disposition: A | Discharge status: Home / Self Care | Payer: Medicare Other | Attending: Emergency Medicine | Admitting: Emergency Medicine

## 2017-08-11 ENCOUNTER — Other Ambulatory Visit: Payer: Self-pay

## 2017-08-11 DIAGNOSIS — R339 Retention of urine, unspecified: Secondary | ICD-10-CM | POA: Diagnosis not present

## 2017-08-11 DIAGNOSIS — R338 Other retention of urine: Secondary | ICD-10-CM | POA: Diagnosis not present

## 2017-08-11 LAB — URINALYSIS, ROUTINE W REFLEX MICROSCOPIC
BACTERIA UA: NONE SEEN
Bilirubin Urine: NEGATIVE
Glucose, UA: NEGATIVE mg/dL
Hgb urine dipstick: NEGATIVE
Ketones, ur: NEGATIVE mg/dL
Leukocytes, UA: NEGATIVE
NITRITE: NEGATIVE
PH: 6 (ref 5.0–8.0)
Protein, ur: NEGATIVE mg/dL
SPECIFIC GRAVITY, URINE: 1.011 (ref 1.005–1.030)
SQUAMOUS EPITHELIAL / LPF: NONE SEEN

## 2017-08-11 NOTE — ED Triage Notes (Signed)
Pt states she has not been able to urinate for at least the past 12 hrs  Pt states she had the same problem on Tuesday and went to her dr and they did a cath and sent her home with flomax and she is to go back in the morning  Pt states she is not sure how long she should have waited but is very uncomfortable

## 2017-08-11 NOTE — ED Notes (Signed)
Leg bag applied and pt was taught on maintenance and proper cleaning of the catheter.

## 2017-08-11 NOTE — ED Notes (Signed)
Patient's bladder noted to be prolapsed. Bladder gently pushed back into body to be able to view meatus

## 2017-08-11 NOTE — ED Provider Notes (Signed)
[facility] DEPT Provider Note   CSN: [id number] Arrival date & time: 08/10/17  2322     History   Chief Complaint Chief Complaint  Patient presents with  . Urinary Retention    HPI Adrienne [name] is a 83 y.o. female.  The history is provided by the patient.  Illness  This is a new problem. The current episode started 6 to 12 hours ago. The problem occurs constantly. The problem has been gradually worsening. Associated symptoms include abdominal pain. Nothing aggravates the symptoms. Nothing relieves the symptoms.  Patient reports recent difficulty with urinating. She was recently seen by urologist, had a catheter placed and removed However the past 12 hours she has been able to unable to urinate Reports lower abdominal discomfort She denies fever vomiting, denies back pain No other acute complaints She has follow-up with urology tomorrow  Past Medical History:  Diagnosis Date  . Afib ([place])   . Arthritis of ankle   . CAD (coronary artery disease)   . Cancer [facility]) 2012   breast cancer-right  . Compression fracture of T12 vertebra (HCC)   . Diverticulosis   . Edema   . Elevated uric acid in blood   . GI bleed 12/2015  . Hyperlipidemia   . Hypertension   . Long-term (current) use of anticoagulants   . Moderate mitral regurgitation   . Pulmonary HTN ([place])   . Stroke ([place]) 01/25/2016  . TIA (transient ischemic attack) 09/2011  . Urinary incontinence     Patient Active Problem List   Diagnosis Date Noted  . Diplopia 01/29/2016  . GERD (gastroesophageal reflux disease) 01/29/2016  . Cerebrovascular accident (CVA) due to embolism of left middle cerebral artery ([place]) 01/29/2016  . Persistent atrial fibrillation ([place])   . Bleeding gastrointestinal   . Gait disturbance, post-stroke 01/27/2016  . Fall   . Secondary hypertension, unspecified   . Cerebral infarction due to thrombosis of left middle cerebral artery (HCC) s/p mechanical  thrombectomy   . Malnutrition of moderate degree 01/09/2016  . GI bleed 01/06/2016  . Chronic atrial fibrillation ([place]) 06/10/2015  . Pulmonary hypertension, secondary ([place]) 06/10/2015  . Chronic anticoagulation 06/10/2015  . Hyperlipidemia 06/10/2015  . Essential hypertension 06/10/2015  . History of stroke 06/10/2015  . Coronary artery disease due to lipid rich plaque 06/10/2015  . Afib ([place])   . Elevated uric acid in blood   . Arthritis of ankle   . Pulmonary HTN ([name])     Past Surgical History:  Procedure Laterality Date  . APPENDECTOMY    . BREAST SURGERY  2012   Rt.mastectomy--[place], [place]  2013  . CATARACT EXTRACTION, BILATERAL    . DILATION AND CURETTAGE OF UTERUS     in her early 39's for DUB  . ESOPHAGOGASTRODUODENOSCOPY Left 01/08/2016   Procedure: ESOPHAGOGASTRODUODENOSCOPY (EGD);  Surgeon: Arta Silence, MD;  Location: [facility];  Service: Endoscopy;  Laterality: Left;  . GALLBLADDER SURGERY    . MASTECTOMY Right   . RADIOLOGY WITH ANESTHESIA N/A 01/25/2016   Procedure: RADIOLOGY WITH ANESTHESIA;  Surgeon: Luanne Bras, MD;  Location: [facility];  Service: Radiology;  Laterality: N/A;  . REPLACEMENT TOTAL KNEE BILATERAL    . TONSILLECTOMY AND ADENOIDECTOMY     as a child    OB History    Gravida Para Term Preterm AB Living   3 3 3     3    SAB TAB Ectopic Multiple Live Births  Home Medications    Prior to Admission medications   Medication Sig Start Date End Date Taking? Authorizing Provider  acetaminophen (TYLENOL) 500 MG tablet Take 500-1,000 mg by mouth every 12 (twelve) hours as needed (pain).     [provider]  amoxicillin (AMOXIL) 500 MG capsule 500 mg. Patient takes 4 tablets by mouth once prior to dental procedures    [provider]  apixaban (ELIQUIS) 5 MG TABS tablet Take 1 tablet (5 mg total) by mouth 2 (two) times daily. 03/17/17   Jerline Pain, MD  atorvastatin (LIPITOR) 10  MG tablet Take 10 mg by mouth daily.    [provider]  calcium-vitamin D 250-100 MG-UNIT tablet Take 1 tablet by mouth 2 (two) times daily.    [provider]  cephALEXin (KEFLEX) 250 MG capsule Take 250 mg by mouth daily. 07/03/16   [provider]  cholecalciferol (VITAMIN D) 1000 units tablet Take 1,000 Units by mouth daily.    [provider]  dextromethorphan-guaiFENesin (MUCINEX DM) 30-600 MG 12hr tablet Take 1 tablet by mouth 2 (two) times daily as needed.     [provider]  diltiazem (CARDIZEM) 60 MG tablet Take 1 tablet (60 mg total) by mouth 3 (three) times daily. 02/17/17   Jerline Pain, MD  furosemide (LASIX) 20 MG tablet Take 20 mg by mouth daily.    [provider]  metoprolol tartrate (LOPRESSOR) 100 MG tablet Take 1 tablet (100 mg total) by mouth 2 (two) times daily. 02/17/17   Jerline Pain, MD  Multiple Vitamins-Minerals (MULTIVITAMIN ADULT PO) Take 1 tablet by mouth daily.     [provider]  omeprazole (PRILOSEC) 40 MG capsule Take 40 mg by mouth every other day.     [provider]  PROAIR HFA 108 [phone] Base) MCG/ACT inhaler Inhale 1-2 puffs into the lungs every 6 (six) hours as needed for wheezing or shortness of breath.  05/05/16   [provider]  vitamin C (ASCORBIC ACID) 500 MG tablet Take 1,000 mg by mouth daily. Takes 2 tabs daily    [provider]    Family History Family History  Problem Relation Age of Onset  . Asthma Mother   . CVA Father   . Atrial fibrillation Sister        HAS PACER  . Pneumonia Brother   . Cancer Daughter        COLON CANCER  . Cancer Maternal Grandmother        breast cancer    Social History Social History   Tobacco Use  . Smoking status: Former Smoker    Types: Cigarettes  . Smokeless tobacco: Never Used  Substance Use Topics  . Alcohol use: No    Alcohol/week: 0.0 oz  . Drug use: No     Allergies   Valium [diazepam]; Amiodarone  hcl [amiodarone]; Compazine [prochlorperazine]; Other; Thorazine [chlorpromazine]; Zometa [zoledronic acid]; and Ciprofloxacin   Review of Systems Review of Systems  Constitutional: Negative for fever.  Gastrointestinal: Positive for abdominal pain.  Genitourinary: Negative for flank pain.  Musculoskeletal: Negative for back pain.  All other systems reviewed and are negative.    Physical Exam Updated Vital Signs BP (!) 160/91 (BP Location: Left Arm)   Pulse 86   Temp 97.8 F (36.6 C) (Oral)   Resp (!) 22   LMP 09/12/1973 (Approximate) Comment: spotting  SpO2 98%   Physical Exam CONSTITUTIONAL: Elderly, no acute distress HEAD: Normocephalic/atraumatic EYES: EOMI ENMT:  Mucous membranes moist NECK: supple no meningeal signs SPINE/BACK:entire spine nontender CV:  no murmurs/rubs/gallops noted LUNGS: Lungs are clear to auscultation bilaterally, no apparent distress ABDOMEN: soft, mild suprapubic tenderness, no rebound or guarding, bowel sounds noted throughout abdomen GU:no cva tenderness NEURO: Pt is awake/alert/appropriate, moves all extremitiesx4.  No facial droop.  No focal motor deficits in the lower extremities EXTREMITIES: pulses normal/equal, full ROM SKIN: warm, color normal PSYCH: no abnormalities of mood noted, alert and oriented to situation   ED Treatments / Results  Labs (all labs ordered are listed, but only abnormal results are displayed) Labs Reviewed  URINE CULTURE  URINALYSIS, ROUTINE W REFLEX MICROSCOPIC    EKG  EKG Interpretation None       Radiology No results found.  Procedures Procedures (including critical care time)  Medications Ordered in ED Medications - No data to display   Initial Impression / Assessment and Plan / ED Course  I have reviewed the triage vital signs and the nursing notes.  Pertinent labs  results that were available during my care of the patient were reviewed by me and considered in my medical decision making  (see chart for details).     Plan to Place Foley catheter, and send urinalysis    Patient improved, tolerated Foley catheter well, will DC home with urology follow-up later today  Final Clinical Impressions(s) / ED Diagnoses   Final diagnoses:  Urinary retention    ED Discharge Orders    None       Ripley Fraise, MD 08/11/17 0230

## 2017-08-12 LAB — URINE CULTURE: Culture: NO GROWTH

## 2017-08-14 ENCOUNTER — Telehealth: Payer: Self-pay | Admitting: Obstetrics and Gynecology

## 2017-08-14 NOTE — Telephone Encounter (Signed)
Spoke with patient. Seen in ER on 11/30 for urinary retention, foley cath placed. F/u with [facility] on 11/30, plan is to keep foley in place until 12/18 f/u. Patient states recommendation was to be fitted for larger pessary prior to 12/18 appt.   Reports discussed larger pessary in 12/2016 with Dr. Quincy Simmonds.   Reports external vaginal itching, has used OTC cortisone cream and lotrimin with no relief or changes.   OV scheduled for 12/12 at 2:30pm with Dr. Quincy Simmonds. Advised Dr. Quincy Simmonds will review, will return call with any additional recommendations. Patient is agreeable.   Dr. Quincy Simmonds- please review.

## 2017-08-14 NOTE — Telephone Encounter (Signed)
Patient says she has a rash and would also like to discuss changing her pessary to a bigger one after having a catheter put in by her urologist.

## 2017-08-15 MED ORDER — NYSTATIN-TRIAMCINOLONE 100000-0.1 UNIT/GM-% EX CREA: TOPICAL_CREAM | CUTANEOUS | 0 refills | Status: DC

## 2017-08-15 NOTE — Telephone Encounter (Signed)
Ok for Mycolog II cream bid externally for one week.

## 2017-08-15 NOTE — Telephone Encounter (Signed)
Spoke with patient, advised as seen below per Dr. Quincy Simmonds. Rx for Mycolog II to verified pharmacy. Patient verbalizes understanding and is agreeable. Will close encounter.

## 2017-08-18 DIAGNOSIS — R338 Other retention of urine: Secondary | ICD-10-CM | POA: Diagnosis not present

## 2017-08-23 ENCOUNTER — Encounter: Payer: Self-pay | Admitting: Obstetrics & Gynecology

## 2017-08-23 ENCOUNTER — Ambulatory Visit (INDEPENDENT_AMBULATORY_CARE_PROVIDER_SITE_OTHER): Payer: Medicare Other | Admitting: Obstetrics & Gynecology

## 2017-08-23 ENCOUNTER — Ambulatory Visit: Payer: Self-pay | Admitting: Obstetrics and Gynecology

## 2017-08-23 VITALS — BP 110/70 | HR 60 | Resp 18 | Ht 60.0 in | Wt 158.0 lb

## 2017-08-23 DIAGNOSIS — I63312 Cerebral infarction due to thrombosis of left middle cerebral artery: Secondary | ICD-10-CM

## 2017-08-23 DIAGNOSIS — N813 Complete uterovaginal prolapse: Secondary | ICD-10-CM | POA: Diagnosis not present

## 2017-08-23 NOTE — Progress Notes (Signed)
GYNECOLOGY  VISIT  CC:   Pessary fitting  HPI: 83 y.o. G12P3003 Married Caucasian female here for pessary fitting.  Pt has #5 incontinence ring but her bladder is falling down around the pessary.  When this occurs, she has complete difficulty voiding.  This happened on 11/30 and she ended up in the ER with complete urinary retention.  A foley catheter was placed (which she still has).  She is here for pessary fitting again to see if we can find a better fit for her.  She is seeing Dr. Matilde Sprang next Tuesday, hopefully for catheter removal.    Denies vaginal bleeding or discharge.  She is on an antibiotic due to the indwelling foley catheter.    GYNECOLOGIC HISTORY: Patient's last menstrual period was 09/12/1973 (approximate). Contraception: post menopausal  Menopausal hormone therapy: none  Patient Active Problem List   Diagnosis Date Noted  . Diplopia 01/29/2016  . GERD (gastroesophageal reflux disease) 01/29/2016  . Cerebrovascular accident (CVA) due to embolism of left middle cerebral artery ([place]) 01/29/2016  . Persistent atrial fibrillation ([place])   . Bleeding gastrointestinal   . Gait disturbance, post-stroke 01/27/2016  . Fall   . Secondary hypertension, unspecified   . Cerebral infarction due to thrombosis of left middle cerebral artery (HCC) s/p mechanical thrombectomy   . Malnutrition of moderate degree 01/09/2016  . GI bleed 01/06/2016  . Chronic atrial fibrillation ([place]) 06/10/2015  . Pulmonary hypertension, secondary ([place]) 06/10/2015  . Chronic anticoagulation 06/10/2015  . Hyperlipidemia 06/10/2015  . Essential hypertension 06/10/2015  . History of stroke 06/10/2015  . Coronary artery disease due to lipid rich plaque 06/10/2015  . Afib ([place])   . Elevated uric acid in blood   . Arthritis of ankle   . Pulmonary HTN ([place])     Past Medical History:  Diagnosis Date  . Afib ([place])   . Arthritis of ankle   . CAD (coronary artery disease)   . Cancer [facility]) 2012    breast cancer-right  . Compression fracture of T12 vertebra (HCC)   . Diverticulosis   . Edema   . Elevated uric acid in blood   . GI bleed 12/2015  . Hyperlipidemia   . Hypertension   . Long-term (current) use of anticoagulants   . Moderate mitral regurgitation   . Pulmonary HTN ([place])   . Stroke ([place]) 01/25/2016  . TIA (transient ischemic attack) 09/2011  . Urinary incontinence     Past Surgical History:  Procedure Laterality Date  . APPENDECTOMY    . BREAST SURGERY  2012   Rt.mastectomy--[place], [place]  2013  . CATARACT EXTRACTION, BILATERAL    . DILATION AND CURETTAGE OF UTERUS     in her early 18's for DUB  . ESOPHAGOGASTRODUODENOSCOPY Left 01/08/2016   Procedure: ESOPHAGOGASTRODUODENOSCOPY (EGD);  Surgeon: Arta Silence, MD;  Location: [facility];  Service: Endoscopy;  Laterality: Left;  . GALLBLADDER SURGERY    . MASTECTOMY Right   . RADIOLOGY WITH ANESTHESIA N/A 01/25/2016   Procedure: RADIOLOGY WITH ANESTHESIA;  Surgeon: Luanne Bras, MD;  Location: [facility];  Service: Radiology;  Laterality: N/A;  . REPLACEMENT TOTAL KNEE BILATERAL    . TONSILLECTOMY AND ADENOIDECTOMY     as a child    MEDS:   Current Outpatient Medications on File Prior to Visit  Medication Sig Dispense Refill  . acetaminophen (TYLENOL) 500 MG tablet Take 500-1,000 mg by mouth every 12 (twelve) hours as needed (pain).     Marland Kitchen amoxicillin (  AMOXIL) 500 MG capsule 500 mg. Patient takes 4 tablets by mouth once prior to dental procedures    . apixaban (ELIQUIS) 5 MG TABS tablet Take 1 tablet (5 mg total) by mouth 2 (two) times daily. 180 tablet 1  . atorvastatin (LIPITOR) 10 MG tablet Take 10 mg by mouth daily.    . calcium-vitamin D 250-100 MG-UNIT tablet Take 1 tablet by mouth 2 (two) times daily.    . cephALEXin (KEFLEX) 250 MG capsule Take 250 mg by mouth daily.    . cholecalciferol (VITAMIN D) 1000 units tablet Take 1,000 Units by mouth daily.    Marland Kitchen  dextromethorphan-guaiFENesin (MUCINEX DM) 30-600 MG 12hr tablet Take 1 tablet by mouth 2 (two) times daily as needed.     . diltiazem (CARDIZEM) 60 MG tablet Take 1 tablet (60 mg total) by mouth 3 (three) times daily. 270 tablet 1  . furosemide (LASIX) 20 MG tablet Take 20 mg by mouth daily.    . metoprolol tartrate (LOPRESSOR) 100 MG tablet Take 1 tablet (100 mg total) by mouth 2 (two) times daily. 180 tablet 1  . Multiple Vitamins-Minerals (MULTIVITAMIN ADULT PO) Take 1 tablet by mouth daily.     Marland Kitchen nystatin-triamcinolone (MYCOLOG II) cream Apply externally 2 (two) times daily for one week. 30 g 0  . omeprazole (PRILOSEC) 40 MG capsule Take 40 mg by mouth every other day.     Marland Kitchen PROAIR HFA 108 (90 Base) MCG/ACT inhaler Inhale 1-2 puffs into the lungs every 6 (six) hours as needed for wheezing or shortness of breath.     . tamsulosin (FLOMAX) 0.4 MG CAPS capsule Take 0.4 mg by mouth daily.    . vitamin C (ASCORBIC ACID) 500 MG tablet Take 1,000 mg by mouth daily. Takes 2 tabs daily     No current facility-administered medications on file prior to visit.     ALLERGIES: Valium [diazepam]; Amiodarone hcl [amiodarone]; Compazine [prochlorperazine]; Other; Thorazine [chlorpromazine]; Zometa [zoledronic acid]; and Ciprofloxacin  Family History  Problem Relation Age of Onset  . Asthma Mother   . CVA Father   . Atrial fibrillation Sister        HAS PACER  . Pneumonia Brother   . Cancer Daughter        COLON CANCER  . Cancer Maternal Grandmother        breast cancer    SH:  Married, non smoker  Review of Systems  Cardiovascular: Positive for leg swelling.  All other systems reviewed and are negative.   PHYSICAL EXAMINATION:    BP 110/70 (BP Location: Right Arm, Patient Position: Sitting, Cuff Size: Normal)   Pulse 60   Resp 18   Ht 5' (1.524 m)   Wt 158 lb (71.7 kg)   LMP 09/12/1973 (Approximate) Comment: spotting  BMI 30.86 kg/m     General appearance: alert, cooperative and  appears stated age Abdomen: soft, non-tender; bowel sounds normal; no masses,  no organomegaly  Pelvic: External genitalia:  no lesions              Urethra:  normal appearing urethra with no masses, tenderness or lesions              Bartholins and Skenes: normal                 Vagina: normal appearing vagina with normal color and discharge, no lesions              Cervix: about 3-4cm of cervix is  prolapsed through the introitus, tissue anteriorly underneath bladder is thickened due to prolapse, no abnormal lesions noted              Bimanual Exam:  Uterus:  normal size, contour, position, consistency, mobility, non-tender, cervix and uterus are easily reduced              Adnexa: no mass, fullness, tenderness              Anus:  normal sphincter tone, no lesions  Gelhorn pessaries sizes 2 1/2 and 2 1/2 were placed.  These were expelled with walking.  Size 2 3/4 was too large for placement.  Size 6 and 7 incontinence ring with support placed.  The fitting pessary #6 stayed in placed but the milex ones both were expelled (possibly because they are softer.)  Will plan to order a stiff #6 incontinence ring with support for pt and place in office may end up needing #7 but will start with the smaller size.  The two opened pessaries were given to pt to take with her.  Tolerated procedure well.  Chaperone was present for exam.  Assessment: Urinary retention, currently with indwelling foley catheter Complete uterine prolapse with cervix protruding through introitus  Plan: Pessary ordered today.  Will contact pt when it comes in for placement.  May need to change Dr. Mikle Bosworth appt on Tuesday.

## 2017-08-25 DIAGNOSIS — R0609 Other forms of dyspnea: Secondary | ICD-10-CM | POA: Diagnosis not present

## 2017-08-25 DIAGNOSIS — I700 Atherosclerosis of aorta: Secondary | ICD-10-CM | POA: Diagnosis not present

## 2017-08-25 DIAGNOSIS — R252 Cramp and spasm: Secondary | ICD-10-CM | POA: Diagnosis not present

## 2017-08-25 DIAGNOSIS — R339 Retention of urine, unspecified: Secondary | ICD-10-CM | POA: Diagnosis not present

## 2017-08-25 DIAGNOSIS — K219 Gastro-esophageal reflux disease without esophagitis: Secondary | ICD-10-CM | POA: Diagnosis not present

## 2017-08-28 ENCOUNTER — Ambulatory Visit (INDEPENDENT_AMBULATORY_CARE_PROVIDER_SITE_OTHER): Payer: Medicare Other | Admitting: Obstetrics & Gynecology

## 2017-08-28 ENCOUNTER — Encounter: Payer: Self-pay | Admitting: Obstetrics & Gynecology

## 2017-08-28 ENCOUNTER — Telehealth: Payer: Self-pay | Admitting: Obstetrics and Gynecology

## 2017-08-28 VITALS — BP 122/62 | HR 64 | Resp 18 | Ht 60.0 in | Wt 160.0 lb

## 2017-08-28 DIAGNOSIS — N813 Complete uterovaginal prolapse: Secondary | ICD-10-CM | POA: Diagnosis not present

## 2017-08-28 DIAGNOSIS — I63312 Cerebral infarction due to thrombosis of left middle cerebral artery: Secondary | ICD-10-CM | POA: Diagnosis not present

## 2017-08-28 NOTE — Telephone Encounter (Signed)
Patient says she is supposed to get a call to schedule pessary fitting for today.

## 2017-08-28 NOTE — Telephone Encounter (Signed)
Call to patient approximately 1030 am. Advised pessary order has arrived and appointment scheduled for this afternoon.  Routing to provider for final review. Patient agreeable to disposition. Will close encounter.

## 2017-08-28 NOTE — Progress Notes (Signed)
GYNECOLOGY  VISIT  CC:   Pessary placement  HPI: 83 y.o. G57P3003 Married Caucasian female here for pessary insertion.  H/O complete uterovaginal prolapse with pessary use.  Pt was using #5 incontinence ring but was having issues with her bladder prolapsing around the pessary.  She ended up in urinary retention and in the ER with catheter placement on 08/11/17.  She was fitted for new pessary on 08/12/17.  Pessary was ordered.  She has appt with Dr. Matilde Sprang tomorrow for catheter removal.  Will place #6 incontinence ring today.  Denies vaginal bleeding.  GYNECOLOGIC HISTORY: Patient's last menstrual period was 09/12/1973 (approximate). Contraception: post menopausal  Menopausal hormone therapy: none  Patient Active Problem List   Diagnosis Date Noted  . Diplopia 01/29/2016  . GERD (gastroesophageal reflux disease) 01/29/2016  . Cerebrovascular accident (CVA) due to embolism of left middle cerebral artery ([place]) 01/29/2016  . Persistent atrial fibrillation ([place])   . Bleeding gastrointestinal   . Gait disturbance, post-stroke 01/27/2016  . Fall   . Secondary hypertension, unspecified   . Cerebral infarction due to thrombosis of left middle cerebral artery (HCC) s/p mechanical thrombectomy   . Malnutrition of moderate degree 01/09/2016  . GI bleed 01/06/2016  . Chronic atrial fibrillation ([place]) 06/10/2015  . Pulmonary hypertension, secondary ([place]) 06/10/2015  . Chronic anticoagulation 06/10/2015  . Hyperlipidemia 06/10/2015  . Essential hypertension 06/10/2015  . History of stroke 06/10/2015  . Coronary artery disease due to lipid rich plaque 06/10/2015  . Afib ([place])   . Elevated uric acid in blood   . Arthritis of ankle   . Pulmonary HTN ([place])     Past Medical History:  Diagnosis Date  . Afib ([place])   . Arthritis of ankle   . CAD (coronary artery disease)   . Cancer [facility]) 2012   breast cancer-right  . Compression fracture of T12 vertebra (HCC)   . Diverticulosis   . Edema    . Elevated uric acid in blood   . GI bleed 12/2015  . Hyperlipidemia   . Hypertension   . Long-term (current) use of anticoagulants   . Moderate mitral regurgitation   . Pulmonary HTN ([place])   . Stroke ([place]) 01/25/2016  . TIA (transient ischemic attack) 09/2011  . Urinary incontinence     Past Surgical History:  Procedure Laterality Date  . APPENDECTOMY    . BREAST SURGERY  2012   Rt.mastectomy--[place]  2013  . CATARACT EXTRACTION, BILATERAL    . DILATION AND CURETTAGE OF UTERUS     in her early 7's for DUB  . ESOPHAGOGASTRODUODENOSCOPY Left 01/08/2016   Procedure: ESOPHAGOGASTRODUODENOSCOPY (EGD);  Surgeon: Arta Silence, MD;  Location: [facility];  Service: Endoscopy;  Laterality: Left;  . GALLBLADDER SURGERY    . MASTECTOMY Right   . RADIOLOGY WITH ANESTHESIA N/A 01/25/2016   Procedure: RADIOLOGY WITH ANESTHESIA;  Surgeon: Luanne Bras, MD;  Location: [facility];  Service: Radiology;  Laterality: N/A;  . REPLACEMENT TOTAL KNEE BILATERAL    . TONSILLECTOMY AND ADENOIDECTOMY     as a child    MEDS:   Current Outpatient Medications on File Prior to Visit  Medication Sig Dispense Refill  . acetaminophen (TYLENOL) 500 MG tablet Take 500-1,000 mg by mouth every 12 (twelve) hours as needed (pain).     Marland Kitchen amoxicillin (AMOXIL) 500 MG capsule 500 mg. Patient takes 4 tablets by mouth once prior to dental procedures    . apixaban (ELIQUIS) 5 MG TABS  tablet Take 1 tablet (5 mg total) by mouth 2 (two) times daily. 180 tablet 1  . atorvastatin (LIPITOR) 10 MG tablet Take 10 mg by mouth daily.    . calcium-vitamin D 250-100 MG-UNIT tablet Take 1 tablet by mouth 2 (two) times daily.    . cephALEXin (KEFLEX) 250 MG capsule Take 250 mg by mouth daily.    . cholecalciferol (VITAMIN D) 1000 units tablet Take 1,000 Units by mouth daily.    Marland Kitchen dextromethorphan-guaiFENesin (MUCINEX DM) 30-600 MG 12hr tablet Take 1 tablet by mouth 2 (two) times daily as needed.      . diltiazem (CARDIZEM) 60 MG tablet Take 1 tablet (60 mg total) by mouth 3 (three) times daily. 270 tablet 1  . furosemide (LASIX) 20 MG tablet Take 20 mg by mouth daily.    . metoprolol tartrate (LOPRESSOR) 100 MG tablet Take 1 tablet (100 mg total) by mouth 2 (two) times daily. 180 tablet 1  . Multiple Vitamins-Minerals (MULTIVITAMIN ADULT PO) Take 1 tablet by mouth daily.     Marland Kitchen nystatin-triamcinolone (MYCOLOG II) cream Apply externally 2 (two) times daily for one week. 30 g 0  . omeprazole (PRILOSEC) 40 MG capsule Take 40 mg by mouth every other day.     Marland Kitchen PROAIR HFA 108 (90 Base) MCG/ACT inhaler Inhale 1-2 puffs into the lungs every 6 (six) hours as needed for wheezing or shortness of breath.     . tamsulosin (FLOMAX) 0.4 MG CAPS capsule Take 0.4 mg by mouth daily.    . vitamin C (ASCORBIC ACID) 500 MG tablet Take 1,000 mg by mouth daily. Takes 2 tabs daily     No current facility-administered medications on file prior to visit.     ALLERGIES: Valium [diazepam]; Amiodarone hcl [amiodarone]; Compazine [prochlorperazine]; Other; Thorazine [chlorpromazine]; Zometa [zoledronic acid]; and Ciprofloxacin  Family History  Problem Relation Age of Onset  . Asthma Mother   . CVA Father   . Atrial fibrillation Sister        HAS PACER  . Pneumonia Brother   . Cancer Daughter        COLON CANCER  . Cancer Maternal Grandmother        breast cancer    SH:  Married, non smoker  Review of Systems  All other systems reviewed and are negative.   PHYSICAL EXAMINATION:    BP 122/62 (BP Location: Right Arm, Patient Position: Sitting, Cuff Size: Large)   Pulse 64   Resp 18   Ht 5' (1.524 m)   Wt 160 lb (72.6 kg)   LMP 09/12/1973 (Approximate) Comment: spotting  BMI 31.25 kg/m     General appearance: alert, cooperative and appears stated age Abdomen: soft, non-tender; bowel sounds normal; no masses,  no organomegaly  Pelvic: External genitalia:  no lesions              Urethra:  normal  appearing urethra with no masses, tenderness or lesions, urethral prolapse is noted today (was present on prior exam as well)              Bartholins and Skenes: normal                 Vagina: normal appearing vagina with normal color and discharge, complete uterovaginal prolapse  #6 incontinence ring with support pessary placed without difficulty.  Pt was able to ambulate without pessary coming out.  Also, she was able to sit and Valsalva without it coming out.  Feel this is a  better fit.                Chaperone was present for exam.  Assessment: Complete uterovaginal prolapse H/O urinary retention with catheter use On prophylactic antibiotics  Plan: She will see Dr. Matilde Sprang tomorrow for catheter removal. Will recheck 3 months if does ok.

## 2017-08-28 NOTE — Telephone Encounter (Signed)
Spoke with patient. Advised pessary was ordered 08/23/17, can take 3-5 days for delivery. Pessary has not been delivered to [facility], expecting delivery today. Advised patient once delivery received will return call to schedule fitting. If not received in office today, will f/u with shipping/tracking and return call to update. Patient verbalizes understanding and is agreeable.   Patient is scheduled at [facility] on 12/18 at [place] to S. Orvan Seen, RN

## 2017-08-29 ENCOUNTER — Encounter (HOSPITAL_COMMUNITY): Payer: Self-pay | Admitting: Family Medicine

## 2017-08-29 ENCOUNTER — Emergency Department (HOSPITAL_COMMUNITY)
Admission: EM | Admit: 2017-08-29 | Discharge: 2017-08-30 | Disposition: A | Discharge status: Home / Self Care | Payer: Medicare Other | Attending: Emergency Medicine | Admitting: Emergency Medicine

## 2017-08-29 DIAGNOSIS — N8111 Cystocele, midline: Secondary | ICD-10-CM | POA: Diagnosis not present

## 2017-08-29 DIAGNOSIS — N3000 Acute cystitis without hematuria: Secondary | ICD-10-CM | POA: Insufficient documentation

## 2017-08-29 DIAGNOSIS — Z79899 Other long term (current) drug therapy: Secondary | ICD-10-CM | POA: Diagnosis not present

## 2017-08-29 DIAGNOSIS — I2510 Atherosclerotic heart disease of native coronary artery without angina pectoris: Secondary | ICD-10-CM | POA: Diagnosis not present

## 2017-08-29 DIAGNOSIS — R339 Retention of urine, unspecified: Secondary | ICD-10-CM | POA: Diagnosis not present

## 2017-08-29 DIAGNOSIS — Z853 Personal history of malignant neoplasm of breast: Secondary | ICD-10-CM | POA: Insufficient documentation

## 2017-08-29 DIAGNOSIS — R338 Other retention of urine: Secondary | ICD-10-CM | POA: Diagnosis not present

## 2017-08-29 DIAGNOSIS — I4891 Unspecified atrial fibrillation: Secondary | ICD-10-CM | POA: Diagnosis not present

## 2017-08-29 DIAGNOSIS — R300 Dysuria: Secondary | ICD-10-CM | POA: Insufficient documentation

## 2017-08-29 DIAGNOSIS — Z87891 Personal history of nicotine dependence: Secondary | ICD-10-CM | POA: Insufficient documentation

## 2017-08-29 DIAGNOSIS — I10 Essential (primary) hypertension: Secondary | ICD-10-CM | POA: Insufficient documentation

## 2017-08-29 DIAGNOSIS — Z7901 Long term (current) use of anticoagulants: Secondary | ICD-10-CM | POA: Diagnosis not present

## 2017-08-29 NOTE — ED Triage Notes (Signed)
Patient is complaining of vaginal pain, burning when urinating, and urinary retention. Patient reports she had a foley catheter removed by the urologist this morning. She has urinated two small amounts.

## 2017-08-30 DIAGNOSIS — N3000 Acute cystitis without hematuria: Secondary | ICD-10-CM | POA: Diagnosis not present

## 2017-08-30 LAB — BASIC METABOLIC PANEL
ANION GAP: 11 (ref 5–15)
BUN: 22 mg/dL — ABNORMAL HIGH (ref 6–20)
CO2: 25 mmol/L (ref 22–32)
Calcium: 9.5 mg/dL (ref 8.9–10.3)
Chloride: 99 mmol/L — ABNORMAL LOW (ref 101–111)
Creatinine, Ser: 0.93 mg/dL (ref 0.44–1.00)
GFR, EST NON AFRICAN AMERICAN: 54 mL/min — AB (ref >60)
Glucose, Bld: 101 mg/dL — ABNORMAL HIGH (ref 65–99)
POTASSIUM: 3.9 mmol/L (ref 3.5–5.1)
SODIUM: 135 mmol/L (ref 135–145)

## 2017-08-30 LAB — URINALYSIS, ROUTINE W REFLEX MICROSCOPIC
Bilirubin Urine: NEGATIVE
Glucose, UA: NEGATIVE mg/dL
Ketones, ur: NEGATIVE mg/dL
NITRITE: NEGATIVE
PROTEIN: NEGATIVE mg/dL
Specific Gravity, Urine: 1.005 (ref 1.005–1.030)
pH: 6 (ref 5.0–8.0)

## 2017-08-30 LAB — CBC WITH DIFFERENTIAL/PLATELET
BASOS ABS: 0 10*3/uL (ref 0.0–0.1)
BASOS PCT: 0 %
EOS ABS: 0.2 10*3/uL (ref 0.0–0.7)
EOS PCT: 1 %
HCT: 37 % (ref 36.0–46.0)
Hemoglobin: 12.3 g/dL (ref 12.0–15.0)
LYMPHS PCT: 11 %
Lymphs Abs: 1.4 10*3/uL (ref 0.7–4.0)
MCH: 30.8 pg (ref 26.0–34.0)
MCHC: 33.2 g/dL (ref 30.0–36.0)
MCV: 92.5 fL (ref 78.0–100.0)
Monocytes Absolute: 1.3 10*3/uL — ABNORMAL HIGH (ref 0.1–1.0)
Monocytes Relative: 10 %
Neutro Abs: 10.1 10*3/uL — ABNORMAL HIGH (ref 1.7–7.7)
Neutrophils Relative %: 78 %
PLATELETS: 217 10*3/uL (ref 150–400)
RBC: 4 MIL/uL (ref 3.87–5.11)
RDW: 13.3 % (ref 11.5–15.5)
WBC: 12.9 10*3/uL — AB (ref 4.0–10.5)

## 2017-08-30 MED ORDER — DEXTROSE 5 % IV SOLN
1.0000 g | Freq: Once | INTRAVENOUS | Status: AC
  Administered 2017-08-30: 1 g via INTRAVENOUS
  Filled 2017-08-30: qty 10

## 2017-08-30 MED ORDER — CEPHALEXIN 500 MG PO CAPS: 500.0000 mg | ORAL_CAPSULE | Freq: Two times a day (BID) | ORAL | 0 refills | Status: DC

## 2017-08-30 MED ORDER — SODIUM CHLORIDE 0.9 % IV BOLUS (SEPSIS)
1000.0000 mL | Freq: Once | INTRAVENOUS | Status: AC
Start: 2017-08-30 — End: 2017-08-30
  Administered 2017-08-30: 1000 mL via INTRAVENOUS

## 2017-08-30 NOTE — ED Notes (Signed)
Bladder scan 183 after pt was able to void.

## 2017-08-30 NOTE — ED Notes (Signed)
Patient ambulated to bathroom.

## 2017-08-30 NOTE — ED Provider Notes (Signed)
TIME SEEN: 12:15 AM  CHIEF COMPLAINT: Urinary retention, dysuria  HPI: Patient is an 83 year old female with history of hypertension, hyperlipidemia, CAD, A. fib on Eliquis who presents to the emergency department with urinary retention and dysuria.  She had urinary retention on November 30 and had a catheter placed.  States she was seen at [facility] urology now yesterday and had the catheter removed.  States that they thought this was secondary to vaginal prolapse and she has been using a pessary.  States that she was unable to tolerate a larger pessary due to discomfort.  States that she has only been able to urinate twice today after having the Foley catheter removed and feels very full.  In the emergency department she did void a large amount and states she is feeling better.  No back pain.  No fever.  No abdominal pain.  No vomiting or diarrhea.  ROS: See HPI Constitutional: no fever  Eyes: no drainage  ENT: no runny nose   Cardiovascular:  no chest pain  Resp: no SOB  GI: no vomiting GU: no dysuria Integumentary: no rash  Allergy: no hives  Musculoskeletal: no leg swelling  Neurological: no slurred speech ROS otherwise negative  PAST MEDICAL HISTORY/PAST SURGICAL HISTORY:  Past Medical History:  Diagnosis Date  . Afib ([place])   . Arthritis of ankle   . CAD (coronary artery disease)   . Cancer [facility]) 2012   breast cancer-right  . Compression fracture of T12 vertebra (HCC)   . Diverticulosis   . Edema   . Elevated uric acid in blood   . GI bleed 12/2015  . Hyperlipidemia   . Hypertension   . Long-term (current) use of anticoagulants   . Moderate mitral regurgitation   . Pulmonary HTN ([place])   . Stroke ([place]) 01/25/2016  . TIA (transient ischemic attack) 09/2011  . Urinary incontinence     MEDICATIONS:  Prior to Admission medications   Medication Sig Start Date End Date Taking? Authorizing Provider  acetaminophen (TYLENOL) 500 MG tablet Take 500-1,000 mg by mouth every 12  (twelve) hours as needed (pain).    Yes [provider]  amoxicillin (AMOXIL) 500 MG capsule 500 mg. Patient takes 4 tablets by mouth once prior to dental procedures   Yes [provider]  apixaban (ELIQUIS) 5 MG TABS tablet Take 1 tablet (5 mg total) by mouth 2 (two) times daily. 03/17/17  Yes Jerline Pain, MD  atorvastatin (LIPITOR) 10 MG tablet Take 10 mg by mouth daily.   Yes [provider]  calcium-vitamin D 250-100 MG-UNIT tablet Take 2 tablets by mouth 2 (two) times daily.    Yes [provider]  cephALEXin (KEFLEX) 250 MG capsule Take 250 mg by mouth daily. 07/03/16  Yes [provider]  diltiazem (CARDIZEM) 60 MG tablet Take 1 tablet (60 mg total) by mouth 3 (three) times daily. 02/17/17  Yes Jerline Pain, MD  furosemide (LASIX) 20 MG tablet Take 20 mg by mouth every other day.    Yes [provider]  metoprolol tartrate (LOPRESSOR) 100 MG tablet Take 1 tablet (100 mg total) by mouth 2 (two) times daily. 02/17/17  Yes Jerline Pain, MD  Multiple Vitamins-Minerals (MULTIVITAMIN ADULT PO) Take 1 tablet by mouth daily.    Yes [provider]  nystatin-triamcinolone (MYCOLOG II) cream Apply externally 2 (two) times daily for one week. 08/15/17  Yes Nunzio Cobbs, MD  omeprazole (PRILOSEC) 40 MG capsule Take 40 mg  by mouth every other day.    Yes [provider]  PROAIR HFA 108 (90 Base) MCG/ACT inhaler Inhale 1-2 puffs into the lungs every 6 (six) hours as needed for wheezing or shortness of breath.  05/05/16  Yes [provider]  tamsulosin (FLOMAX) 0.4 MG CAPS capsule Take 0.4 mg by mouth daily.   Yes [provider]  vitamin C (ASCORBIC ACID) 500 MG tablet Take 1,000 mg by mouth daily.    Yes [provider]    ALLERGIES:  Allergies  Allergen Reactions  . Valium [Diazepam] Other (See Comments)    HYPERACTIVITY   . Amiodarone Hcl [Amiodarone] Other (See Comments)    AFIB  .  Compazine [Prochlorperazine] Other (See Comments)    HYPERACTIVITY   . Other Other (See Comments)    "Kidney dye"--patient states this gave her severe shakes/chills  . Thorazine [Chlorpromazine] Other (See Comments)    HYPERACTIVITY   . Zometa [Zoledronic Acid] Other (See Comments)    PROBLEMS WITH EYES  . Ciprofloxacin Swelling    "swelling" in Rt.elbow    SOCIAL HISTORY:  Social History   Tobacco Use  . Smoking status: Former Smoker    Types: Cigarettes  . Smokeless tobacco: Never Used  Substance Use Topics  . Alcohol use: No    Alcohol/week: 0.0 oz    FAMILY HISTORY: Family History  Problem Relation Age of Onset  . Asthma Mother   . CVA Father   . Atrial fibrillation Sister        HAS PACER  . Pneumonia Brother   . Cancer Daughter        COLON CANCER  . Cancer Maternal Grandmother        breast cancer    EXAM: BP (!) 148/80 (BP Location: Left Arm)   Pulse 72   Temp 97.6 F (36.4 C) (Oral)   Resp 18   Ht 5\' 2"  (1.575 m)   Wt 68.9 kg (152 lb)   LMP 09/12/1973 (Approximate) Comment: spotting  SpO2 95%   BMI 27.80 kg/m  CONSTITUTIONAL: Alert and oriented and responds appropriately to questions. Well-appearing; well-nourished; elderly HEAD: Normocephalic EYES: Conjunctivae clear, pupils appear equal, EOMI ENT: normal nose; moist mucous membranes NECK: Supple, no meningismus, no nuchal rigidity, no LAD  CARD: RRR; S1 and S2 appreciated; no murmurs, no clicks, no rubs, no gallops RESP: Normal chest excursion without splinting or tachypnea; breath sounds clear and equal bilaterally; no wheezes, no rhonchi, no rales, no hypoxia or respiratory distress, speaking full sentences ABD/GI: Normal bowel sounds; non-distended; soft, non-tender, no rebound, no guarding, no peritoneal signs, no hepatosplenomegaly BACK:  The back appears normal and is non-tender to palpation, there is no CVA tenderness EXT: Normal ROM in all joints; non-tender to palpation; no edema; normal  capillary refill; no cyanosis, no calf tenderness or swelling    SKIN: Normal color for age and race; warm; no rash NEURO: Moves all extremities equally PSYCH: The patient's mood and manner are appropriate. Grooming and personal hygiene are appropriate.  MEDICAL DECISION MAKING: Patient here with urinary retention.  Her bladder scan initially showed 497 mL.  She was able to void a large amount and now her bladder scan shows 150 mL.  She states she is feeling much better and would prefer to not have a Foley catheter placed.  Her urine does appear possibly infected versus contaminated.  I have sent a urine culture but will treat with ceftriaxone.  Will give oral and IV fluids and  check blood work as well today.  I would like patient to be able to void again on her own prior to discharge home.  ED PROGRESS: Patient's labs show mild leukocytosis with left shift.  Normal creatinine.  She has been able to urinate now 3 times on her own and feels much better.  I feel she is safe to be discharged with [facility] follow-up.  Will discharge on Keflex.  Discussed return precautions.   At this time, I do not feel there is any life-threatening condition present. I have reviewed and discussed all results (EKG, imaging, lab, urine as appropriate) and exam findings with patient/family. I have reviewed nursing notes and appropriate previous records.  I feel the patient is safe to be discharged home without further emergent workup and can continue workup as an outpatient as needed. Discussed usual and customary return precautions. Patient/family verbalize understanding and are comfortable with this plan.  Outpatient follow-up has been provided if needed. All questions have been answered.      Marvion Bastidas, Delice Bison, DO 08/30/17 [phone]

## 2017-08-30 NOTE — ED Notes (Signed)
Blood was collected prior to iv fluids and antibiotics being started.

## 2017-09-01 DIAGNOSIS — R338 Other retention of urine: Secondary | ICD-10-CM | POA: Diagnosis not present

## 2017-09-01 DIAGNOSIS — N3020 Other chronic cystitis without hematuria: Secondary | ICD-10-CM | POA: Diagnosis not present

## 2017-09-01 LAB — URINE CULTURE

## 2017-09-02 ENCOUNTER — Telehealth: Payer: Self-pay

## 2017-09-02 NOTE — Telephone Encounter (Signed)
Pt seen by Urologist and changed to [place]. Spoke with Solmon Ice D and OK'ed this as sensitive to UC

## 2017-09-02 NOTE — Progress Notes (Signed)
ED Antimicrobial Stewardship Positive Culture Follow Up   Adrienne [name] is an 83 y.o. female who presented to [facility] on 08/29/2017 with a chief complaint of  Chief Complaint  Patient presents with  . Abdominal Pain  . Urinary Retention    Recent Results (from the past 720 hour(s))  Urine culture     Status: None   Collection Time: 08/11/17 12:49 AM  Result Value Ref Range Status   Specimen Description URINE, CATHETERIZED  Final   Special Requests NONE  Final   Culture   Final    NO GROWTH Performed at [facility], [address], [place], [place] [address]    Report Status 08/12/2017 FINAL  Final  Urine culture     Status: Abnormal   Collection Time: 08/29/17 11:43 PM  Result Value Ref Range Status   Specimen Description URINE, RANDOM  Final   Special Requests NONE  Final   Culture 80,000 COLONIES/mL ESCHERICHIA COLI (A)  Final   Report Status 09/01/2017 FINAL  Final   Organism ID, Bacteria ESCHERICHIA COLI (A)  Final      Susceptibility   Escherichia coli - MIC*    AMPICILLIN >=32 RESISTANT Resistant     CEFAZOLIN >=64 RESISTANT Resistant     CEFTRIAXONE <=1 SENSITIVE Sensitive     CIPROFLOXACIN <=0.25 SENSITIVE Sensitive     GENTAMICIN <=1 SENSITIVE Sensitive     IMIPENEM <=0.25 SENSITIVE Sensitive     NITROFURANTOIN <=16 SENSITIVE Sensitive     TRIMETH/SULFA <=20 SENSITIVE Sensitive     AMPICILLIN/SULBACTAM >=32 RESISTANT Resistant     PIP/TAZO <=4 SENSITIVE Sensitive     Extended ESBL NEGATIVE Sensitive     * 80,000 COLONIES/mL ESCHERICHIA COLI    [x]  Treated with cephlexin, organism resistant to prescribed antimicrobial []  Patient discharged originally without antimicrobial agent and treatment is now indicated  New antibiotic prescription: DC cephalexin, start cefpodoxime 100mg  PO BID x 7 days  ED Provider: Wyn Quaker, PA   Milo, Rande Lawman 09/02/2017, 10:41 AM Clinical Pharmacist Phone# [phone]

## 2017-09-08 ENCOUNTER — Telehealth: Payer: Self-pay | Admitting: Obstetrics & Gynecology

## 2017-09-08 ENCOUNTER — Other Ambulatory Visit: Payer: Self-pay | Admitting: Cardiology

## 2017-09-08 MED ORDER — ATORVASTATIN CALCIUM 10 MG PO TABS: 10.0000 mg | ORAL_TABLET | Freq: Every day | ORAL | 1 refills | Status: DC

## 2017-09-08 MED ORDER — APIXABAN 5 MG PO TABS
5.0000 mg | ORAL_TABLET | Freq: Two times a day (BID) | ORAL | 1 refills | Status: DC
Start: 2017-09-08 — End: 2018-03-12

## 2017-09-08 NOTE — Telephone Encounter (Signed)
Pt needing a refill on Eliquis. Please address sent to [facility]

## 2017-09-08 NOTE — Telephone Encounter (Addendum)
Spoke with patient. States size 7 pessary was discussed at last OV with Dr. Sabra Heck, f/u to see if ordered?   Patient states she was seen in ER and by Dr. Matilde Sprang since last OV on 12/17, foley catheter replaced on 12/21. Patient states she "failed voiding test" with Dr. Matilde Sprang and is scheduled to return on 09/26/17 with new size 7 pessary.   Advised patient will f/u with Dr. Sabra Heck regarding pessary and return call, patient is agreeable.   Dr. Sabra Heck - please advise?   CcKandace Blitz, RN

## 2017-09-08 NOTE — Telephone Encounter (Signed)
Call returned to patient to confirm if pessary still in place.   Patient states she removed the size 6, was uncomfortable and had to keep pushing it back in. Patient states she had size 5 in place when she was seen by Dr. Matilde Sprang. States size 5 currently in place. Advised patient will update Dr. Sabra Heck.    Reviewed with Dr. Sabra Heck -order size 7 pessary.   Routing to S. Orvan Seen, RN  Cc: Dr. Sabra Heck

## 2017-09-08 NOTE — Telephone Encounter (Signed)
Per instructions from Dr Sabra Heck, Size 7 Milex pessary ring with support ordered from [facility].

## 2017-09-08 NOTE — Telephone Encounter (Signed)
Eliquis 5mg  refill request received; pt is 83 yrs old, Crea-0.93 on 08/30/17, Wt-72.6kg, last seen by Dr. Marlou Porch on 03/28/17; refill sent to requested pharmacy.

## 2017-09-08 NOTE — Telephone Encounter (Signed)
Spoke with patient. Advised size 7 pessary has been ordered, expect 3-5 business days for delivery. Advised patient will call once received and schedule OV. Patient is agreeable.   Routing to provider for final review. Patient is agreeable to disposition. Will close encounter.

## 2017-09-08 NOTE — Telephone Encounter (Signed)
Patient calling to check if a new pessary has been ordered.

## 2017-09-11 DIAGNOSIS — R0602 Shortness of breath: Secondary | ICD-10-CM | POA: Diagnosis not present

## 2017-09-11 DIAGNOSIS — N200 Calculus of kidney: Secondary | ICD-10-CM | POA: Diagnosis not present

## 2017-09-11 DIAGNOSIS — R3911 Hesitancy of micturition: Secondary | ICD-10-CM | POA: Diagnosis not present

## 2017-09-18 ENCOUNTER — Encounter: Payer: Self-pay | Admitting: Obstetrics & Gynecology

## 2017-09-18 ENCOUNTER — Ambulatory Visit (INDEPENDENT_AMBULATORY_CARE_PROVIDER_SITE_OTHER): Payer: Medicare Other | Admitting: Obstetrics & Gynecology

## 2017-09-18 VITALS — BP 120/66 | HR 64 | Resp 16 | Ht 60.0 in | Wt 154.0 lb

## 2017-09-18 DIAGNOSIS — Z978 Presence of other specified devices: Secondary | ICD-10-CM

## 2017-09-18 DIAGNOSIS — I2510 Atherosclerotic heart disease of native coronary artery without angina pectoris: Secondary | ICD-10-CM | POA: Diagnosis not present

## 2017-09-18 DIAGNOSIS — Z960 Presence of urogenital implants: Secondary | ICD-10-CM

## 2017-09-18 DIAGNOSIS — N812 Incomplete uterovaginal prolapse: Secondary | ICD-10-CM

## 2017-09-18 DIAGNOSIS — R339 Retention of urine, unspecified: Secondary | ICD-10-CM | POA: Diagnosis not present

## 2017-09-18 DIAGNOSIS — I2583 Coronary atherosclerosis due to lipid rich plaque: Secondary | ICD-10-CM | POA: Diagnosis not present

## 2017-09-18 MED ORDER — FLUCONAZOLE 150 MG PO TABS: 150.0000 mg | ORAL_TABLET | Freq: Once | ORAL | 0 refills | Status: AC

## 2017-09-18 NOTE — Progress Notes (Signed)
GYNECOLOGY  VISIT  CC:   Pessary placement  HPI: 84 y.o. G60P3003 Married Caucasian female here for pessary insertion.  #7 incontinence ring has been ordered.  Pt still has indwelling foley catheter.  Had this removed about 12/17 but was in ER the next day with urinary retention likely due to cystitis.  She was treated with antibiotics and followed up with Dr. Matilde Sprang who replaced catheter.  Has been usign #5 pessary.  #6 seemed to come out.  #7 ordered and pt here for placement.  GYNECOLOGIC HISTORY: Patient's last menstrual period was 09/12/1973 (approximate). Contraception: post menopausal  Menopausal hormone therapy: none  Patient Active Problem List   Diagnosis Date Noted  . Diplopia 01/29/2016  . GERD (gastroesophageal reflux disease) 01/29/2016  . Cerebrovascular accident (CVA) due to embolism of left middle cerebral artery ([place]) 01/29/2016  . Persistent atrial fibrillation ([place])   . Bleeding gastrointestinal   . Gait disturbance, post-stroke 01/27/2016  . Fall   . Secondary hypertension, unspecified   . Cerebral infarction due to thrombosis of left middle cerebral artery (HCC) s/p mechanical thrombectomy   . Malnutrition of moderate degree 01/09/2016  . GI bleed 01/06/2016  . Chronic atrial fibrillation ([place]) 06/10/2015  . Pulmonary hypertension, secondary ([place] ) 06/10/2015  . Chronic anticoagulation 06/10/2015  . Hyperlipidemia 06/10/2015  . Essential hypertension 06/10/2015  . History of stroke 06/10/2015  . Coronary artery disease due to lipid rich plaque 06/10/2015  . Afib ([place])   . Elevated uric acid in blood   . Arthritis of ankle   . Pulmonary HTN ([place])     Past Medical History:  Diagnosis Date  . Afib ([place])   . Arthritis of ankle   . CAD (coronary artery disease)   . Cancer [facility]) 2012   breast cancer-right  . Compression fracture of T12 vertebra (HCC)   . Diverticulosis   . Edema   . Elevated uric acid in blood   . GI bleed 12/2015  . Hyperlipidemia    . Hypertension   . Long-term (current) use of anticoagulants   . Moderate mitral regurgitation   . Pulmonary HTN ([place])   . Stroke ([place]) 01/25/2016  . TIA (transient ischemic attack) 09/2011  . Urinary incontinence     Past Surgical History:  Procedure Laterality Date  . APPENDECTOMY    . BREAST SURGERY  2012   Rt.mastectomy--[place], [place]  2013  . CATARACT EXTRACTION, BILATERAL    . DILATION AND CURETTAGE OF UTERUS     in her early 89's for DUB  . ESOPHAGOGASTRODUODENOSCOPY Left 01/08/2016   Procedure: ESOPHAGOGASTRODUODENOSCOPY (EGD);  Surgeon: Arta Silence, MD;  Location: [facility];  Service: Endoscopy;  Laterality: Left;  . GALLBLADDER SURGERY    . MASTECTOMY Right   . RADIOLOGY WITH ANESTHESIA N/A 01/25/2016   Procedure: RADIOLOGY WITH ANESTHESIA;  Surgeon: Luanne Bras, MD;  Location: [facility];  Service: Radiology;  Laterality: N/A;  . REPLACEMENT TOTAL KNEE BILATERAL    . TONSILLECTOMY AND ADENOIDECTOMY     as a child    MEDS:   Current Outpatient Medications on File Prior to Visit  Medication Sig Dispense Refill  . acetaminophen (TYLENOL) 500 MG tablet Take 500-1,000 mg by mouth every 12 (twelve) hours as needed (pain).     Marland Kitchen apixaban (ELIQUIS) 5 MG TABS tablet Take 1 tablet (5 mg total) by mouth 2 (two) times daily. 180 tablet 1  . atorvastatin (LIPITOR) 10 MG tablet Take 1 tablet (10 mg total) by  mouth daily. 90 tablet 1  . calcium-vitamin D 250-100 MG-UNIT tablet Take 2 tablets by mouth 2 (two) times daily.     . cephALEXin (KEFLEX) 500 MG capsule Take 1 capsule (500 mg total) by mouth 2 (two) times daily. 14 capsule 0  . diltiazem (CARDIZEM) 60 MG tablet Take 1 tablet (60 mg total) by mouth 3 (three) times daily. 270 tablet 1  . furosemide (LASIX) 20 MG tablet Take 20 mg by mouth daily.     . metoprolol tartrate (LOPRESSOR) 100 MG tablet Take 1 tablet (100 mg total) by mouth 2 (two) times daily. 180 tablet 1  . Multiple  Vitamins-Minerals (MULTIVITAMIN ADULT PO) Take 1 tablet by mouth daily.     Marland Kitchen nystatin-triamcinolone (MYCOLOG II) cream Apply externally 2 (two) times daily for one week. 30 g 0  . omeprazole (PRILOSEC) 40 MG capsule Take 40 mg by mouth every other day.     Marland Kitchen PROAIR HFA 108 (90 Base) MCG/ACT inhaler Inhale 1-2 puffs into the lungs every 6 (six) hours as needed for wheezing or shortness of breath.     . tamsulosin (FLOMAX) 0.4 MG CAPS capsule Take 0.4 mg by mouth daily.    . vitamin C (ASCORBIC ACID) 500 MG tablet Take 1,000 mg by mouth daily.     Marland Kitchen amoxicillin (AMOXIL) 500 MG capsule 500 mg. Patient takes 4 tablets by mouth once prior to dental procedures     No current facility-administered medications on file prior to visit.     ALLERGIES: Valium [diazepam]; Amiodarone hcl [amiodarone]; Compazine [prochlorperazine]; Other; Thorazine [chlorpromazine]; Zometa [zoledronic acid]; and Ciprofloxacin  Family History  Problem Relation Age of Onset  . Asthma Mother   . CVA Father   . Atrial fibrillation Sister        HAS PACER  . Pneumonia Brother   . Cancer Daughter        COLON CANCER  . Cancer Maternal Grandmother        breast cancer    SH:  Widowed, non smoker  Review of Systems  Cardiovascular: Positive for leg swelling.  Genitourinary:       Vulvar itching  All other systems reviewed and are negative.   PHYSICAL EXAMINATION:    BP 120/66 (BP Location: Left Arm, Patient Position: Sitting, Cuff Size: Large)   Pulse 64   Resp 16   Ht 5' (1.524 m)   Wt 154 lb (69.9 kg)   LMP 09/12/1973 (Approximate)   BMI 30.08 kg/m     General appearance: alert, cooperative and appears stated age  Pelvic: External genitalia:  no lesions              Urethra:  normal appearing urethra with no masses, tenderness or lesions              Bartholins and Skenes: normal                 Vagina: normal appearing vagina with normal color and discharge, no lesions, 4th degree cystocele with  associated uterine prolapse              Cervix: no lesions              Bimanual Exam:  Uterus:  normal size, contour, position, consistency, mobility, non-tender              Adnexa: no mass, fullness, tenderness  #7 incontinence ring with support pessary placed.  Pt ambulated but felt uncomfortable with this size pessary.  Was removed.  Will order #6 non-Milex pessary as hopefully one that is more stiff will help with cystocele and prolapse.  Did discussed colpocleisis with pt as well as another possible treatment.  She does not want to do this if at all possible. Chaperone was present for exam.  Assessment: Cystocele with associated uterine prolapse Indwelling foley catheter  Plan: Urine culture Diflucan 150mg  po x 1, repeat 72 hours   ~15 minutes spent with patient >50% of time was in face to face discussion of above.

## 2017-09-19 NOTE — Progress Notes (Signed)
CARDIOLOGY OFFICE NOTE  Date:  09/20/2017    Adrienne [name] Date of Birth: 07/01/1928 Medical Record #[id number]  PCP:  Kathyrn Lass, MD  Cardiologist:  Marisa Cyphers   Chief Complaint  Patient presents with  . Atrial Fibrillation  . Hypertension  . Coronary Artery Disease    6 month check - seen for Dr. Marlou Porch    History of Present Illness: Adrienne [name] is a 84 y.o. female who presents today for a 6 month check. Seen for Dr. Marlou Porch.   She has a history of atrial fibrillation, pulmonary hypertension, coronary artery disease, mitral regurgitation on chronic anticoagulation.  She has had intolerance to amiodarone - she is managed with rate control. She has had a prior stroke. Other issues include breast cancer, nonobstructive CAD per cath in 2013, moderate MR, HLD with intolerance to statins and vertigo. She has had chronic dyspnea - has had PTH in the past - but last echo with normal PAPs.   Admitted in April 2017 with intermittent bloody stools. Anticoagulation was placed on hold. GI work up unremarkable (Hiatal hernia and diverticulosis). On Jan 25, 2016 had right side weakness and was found in bathroom by husband. Coumadin was restarted about a week prior to this, INR 1.5.Left MCA infarct. Interventional radiology. Transitioned to Eliquis.   Comes in today. Here with her husband. Was referred here "urgently" according to the patient. Records from her PCP show BNP of 369. Her appointment was thus moved up. She notes that she has basically been doing ok. She may get short of breath a little easier if she walks a significant distance - this is chronic but may be happening "quicker". No chest pain. No orthopnea, no PND. She has had chronic swelling in her ankles for years. Has varicose veins chronically. She has been on Lasix for many years. Was taking just prn and just recently put on Lasix 20 mg every day. Her weight varies from 150 to 155 range - this is chronic. She does use  salt. They do eat out. She was in the ER last month - has had urinary retention - has had foley previously. Now with a yeast infection - she has been given 2 doses of Fluconazole - she is worried about the side effects regarding her heart and being on diltiazem.  She remains on her Eliquis. She is worried about her prior diagnosis of pulmonary HTN. It is her preference to be on short acting CCB with TID dosing.  Past Medical History:  Diagnosis Date  . Afib ([place])   . Arthritis of ankle   . CAD (coronary artery disease)   . Cancer [facility]) 2012   breast cancer-right  . Compression fracture of T12 vertebra (HCC)   . Diverticulosis   . Edema   . Elevated uric acid in blood   . GI bleed 12/2015  . Hyperlipidemia   . Hypertension   . Long-term (current) use of anticoagulants   . Moderate mitral regurgitation   . Pulmonary HTN ([place])   . Stroke ([place]) 01/25/2016  . TIA (transient ischemic attack) 09/2011  . Urinary incontinence     Past Surgical History:  Procedure Laterality Date  . APPENDECTOMY    . BREAST SURGERY  2012   Rt.mastectomy--[place], [place]  2013  . CATARACT EXTRACTION, BILATERAL    . DILATION AND CURETTAGE OF UTERUS     in her early 21's for DUB  . ESOPHAGOGASTRODUODENOSCOPY Left 01/08/2016   Procedure:  ESOPHAGOGASTRODUODENOSCOPY (EGD);  Surgeon: Arta Silence, MD;  Location: [facility];  Service: Endoscopy;  Laterality: Left;  . GALLBLADDER SURGERY    . MASTECTOMY Right   . RADIOLOGY WITH ANESTHESIA N/A 01/25/2016   Procedure: RADIOLOGY WITH ANESTHESIA;  Surgeon: Luanne Bras, MD;  Location: [facility];  Service: Radiology;  Laterality: N/A;  . REPLACEMENT TOTAL KNEE BILATERAL    . TONSILLECTOMY AND ADENOIDECTOMY     as a child     Medications: Current Meds  Medication Sig  . acetaminophen (TYLENOL) 500 MG tablet Take 500-1,000 mg by mouth every 12 (twelve) hours as needed (pain).   Marland Kitchen amoxicillin (AMOXIL) 500 MG capsule 500 mg. Patient  takes 4 tablets by mouth once prior to dental procedures  . apixaban (ELIQUIS) 5 MG TABS tablet Take 1 tablet (5 mg total) by mouth 2 (two) times daily.  Marland Kitchen atorvastatin (LIPITOR) 10 MG tablet Take 1 tablet (10 mg total) by mouth daily.  . calcium-vitamin D 250-100 MG-UNIT tablet Take 2 tablets by mouth 2 (two) times daily.   . cephALEXin (KEFLEX) 500 MG capsule Take 1 capsule (500 mg total) by mouth 2 (two) times daily.  . Cholecalciferol (VITAMIN D-3) 1000 units CAPS Take 1,000 Units by mouth daily.  . Cholecalciferol (VITAMIN D3) 1000 units CHEW Chew by mouth.  . diltiazem (CARDIZEM) 60 MG tablet Take 1 tablet (60 mg total) by mouth 3 (three) times daily.  . furosemide (LASIX) 20 MG tablet Take 20 mg by mouth daily as needed.  . metoprolol tartrate (LOPRESSOR) 100 MG tablet Take 1 tablet (100 mg total) by mouth 2 (two) times daily.  . Multiple Vitamins-Minerals (MULTIVITAMIN ADULT PO) Take 1 tablet by mouth daily.   Marland Kitchen nystatin-triamcinolone (MYCOLOG II) cream Apply externally 2 (two) times daily for one week.  Marland Kitchen omeprazole (PRILOSEC) 40 MG capsule Take 40 mg by mouth every other day.   Marland Kitchen PROAIR HFA 108 (90 Base) MCG/ACT inhaler Inhale 1-2 puffs into the lungs every 6 (six) hours as needed for wheezing or shortness of breath.   . tamsulosin (FLOMAX) 0.4 MG CAPS capsule Take 0.4 mg by mouth daily.  . vitamin C (ASCORBIC ACID) 500 MG tablet Take 1,000 mg by mouth daily.      Allergies: Allergies  Allergen Reactions  . Valium [Diazepam] Other (See Comments)    HYPERACTIVITY   . Amiodarone Hcl [Amiodarone] Other (See Comments)    AFIB  . Compazine [Prochlorperazine] Other (See Comments)    HYPERACTIVITY   . Other Other (See Comments)    "Kidney dye"--patient states this gave her severe shakes/chills  . Thorazine [Chlorpromazine] Other (See Comments)    HYPERACTIVITY   . Zometa [Zoledronic Acid] Other (See Comments)    PROBLEMS WITH EYES  . Ciprofloxacin Swelling    "swelling" in  Rt.elbow    Social History: The patient  reports that she has quit smoking. Her smoking use included cigarettes. she has never used smokeless tobacco. She reports that she does not drink alcohol or use drugs.   Family History: The patient's family history includes Asthma in her mother; Atrial fibrillation in her sister; CVA in her father; Cancer in her daughter and maternal grandmother; Pneumonia in her brother.   Review of Systems: Please see the history of present illness.   Otherwise, the review of systems is positive for none.   All other systems are reviewed and negative.   Physical Exam: VS:  BP 112/60 (BP Location: Left Arm, Patient Position: Sitting, Cuff Size: Normal)  Pulse 65   Ht 5\' 1"  (1.549 m)   Wt 155 lb (70.3 kg)   LMP 09/12/1973 (Approximate)   SpO2 97% Comment: at rest  BMI 29.29 kg/m  .  BMI Body mass index is 29.29 kg/m.  Wt Readings from Last 3 Encounters:  09/20/17 155 lb (70.3 kg)  09/18/17 154 lb (69.9 kg)  08/29/17 152 lb (68.9 kg)    General: Pleasant. Elderly female. Alert and in no acute distress.  Her weight was 152 in July with Dr. Marlou Porch.  HEENT: Normal.  Neck: Supple, no JVD, carotid bruits, or masses noted.  Cardiac: Irregular irregular rhythm. Her rate is controlled. Heart tones are distant. +1 edema. Lots of varicosities noted.  Respiratory:  Lungs are clear to auscultation bilaterally with normal work of breathing.  GI: Soft and nontender.  MS: No deformity or atrophy. Gait and ROM intact.  Skin: Warm and dry. Color is normal.  Neuro:  Strength and sensation are intact and no gross focal deficits noted.  Psych: Alert, appropriate and with normal affect.   LABORATORY DATA:  EKG:  EKG is not ordered today.  Lab Results  Component Value Date   WBC 12.9 (H) 08/30/2017   HGB 12.3 08/30/2017   HCT 37.0 08/30/2017   PLT 217 08/30/2017   GLUCOSE 101 (H) 08/30/2017   CHOL 121 01/10/2017   TRIG 76 01/10/2017   HDL 54 01/10/2017    LDLCALC 52 01/10/2017   ALT 7 01/10/2017   AST 12 (L) 02/01/2016   NA 135 08/30/2017   K 3.9 08/30/2017   CL 99 (L) 08/30/2017   CREATININE 0.93 08/30/2017   BUN 22 (H) 08/30/2017   CO2 25 08/30/2017   INR 1.27 12/24/2016   HGBA1C 5.6 01/26/2016     BNP (last 3 results) No results for input(s): BNP in the last 8760 hours.  ProBNP (last 3 results) No results for input(s): PROBNP in the last 8760 hours.   B Natriuretic Peptide 0.0 - 100.0 pg/mL 258.2 Abnormally high  07/2016     Other Studies Reviewed Today:  ECHO 01/26/16 - Left ventricle: The cavity size was normal. Systolic function was normal. The estimated ejection fraction was in the range of 60% to 65%. Wall motion was normal; there were no regional wall motion abnormalities. The study is not technically sufficient to allow evaluation of LV diastolic function. - Aortic valve: Valve mobility was mildly restricted. Transvalvular velocity was within the normal range. There was no stenosis. There was trivial regurgitation. - Mitral valve: Calcified annulus. Transvalvular velocity was within the normal range. There was no evidence for stenosis. There was trivial regurgitation. - Left atrium: The atrium was moderately dilated. - Right ventricle: The cavity size was normal. Wall thickness was normal. Systolic function was normal. - Atrial septum: No defect or patent foramen ovale was identified by color flow Doppler. - Tricuspid valve: There was mild regurgitation. - Pulmonary arteries: Systolic pressure was within the normal range. PA peak pressure: 29 mm Hg (S).  Assessment/Plan:  1. Permanent atrial fibrillation - she is managed with rate control and continued anticoagulation - failed on amiodarone - now on Eliquis. She has been on chronic short acting CCB for many years per her preference.   2. Prior stroke - in the setting of being off anticoagulation due to GI bleeding  3. CAD - no active  chest pain - favor conservative management.   4. HLD - statin intolerance  5. Pulmonary hypertension - this was a diagnosis  when she was in [place].  Her echocardiograms here showed normal pulmonary pressures. She is asking for this to be updated today.   6. Chronic anticoagulation   - now on Eliquis   8. Chronic lower extremity edema/venous insufficiency She has had 3 gout attacks in the past with daily use of Lasix. She has now been placed back on due to the elevated BNP - however this is only mildly elevated. BNP was mildly elevated in 2017. I suspect she is really not too far off from her baseline.  I would favor salt restriction. Go back to prn Lasix to avoid gout. I do not think this is florid heart failure. Would encouraged support stockings as well. She is on CCB therapy which may be affecting her edema as well.   9. Yeast infection - the current therapy does have an interaction with her Eliquis - increased risk of bleeding - I would favor alternative therapy.   Current medicines are reviewed with the patient today.  The patient does not have concerns regarding medicines other than what has been noted above.  The following changes have been made:  See above.  Labs/ tests ordered today include:    Orders Placed This Encounter  Procedures  . Basic metabolic panel  . CBC  . Pro b natriuretic peptide (BNP)  . ECHOCARDIOGRAM COMPLETE     Disposition:   FU with Dr. Marlou Porch in about 3 months.    Patient is agreeable to this plan and will call if any problems develop in the interim.   SignedTruitt Merle, NP  09/20/2017 3:27 PM  [facility] [address] [place], [place]  [address] Phone: [phone] Fax: [phone]

## 2017-09-20 ENCOUNTER — Encounter: Payer: Self-pay | Admitting: Nurse Practitioner

## 2017-09-20 ENCOUNTER — Ambulatory Visit (INDEPENDENT_AMBULATORY_CARE_PROVIDER_SITE_OTHER): Payer: Medicare Other | Admitting: Nurse Practitioner

## 2017-09-20 VITALS — BP 112/60 | HR 65 | Ht 61.0 in | Wt 155.0 lb

## 2017-09-20 DIAGNOSIS — I2510 Atherosclerotic heart disease of native coronary artery without angina pectoris: Secondary | ICD-10-CM | POA: Diagnosis not present

## 2017-09-20 DIAGNOSIS — Z8673 Personal history of transient ischemic attack (TIA), and cerebral infarction without residual deficits: Secondary | ICD-10-CM | POA: Diagnosis not present

## 2017-09-20 DIAGNOSIS — I10 Essential (primary) hypertension: Secondary | ICD-10-CM | POA: Diagnosis not present

## 2017-09-20 DIAGNOSIS — I481 Persistent atrial fibrillation: Secondary | ICD-10-CM

## 2017-09-20 DIAGNOSIS — E785 Hyperlipidemia, unspecified: Secondary | ICD-10-CM | POA: Diagnosis not present

## 2017-09-20 DIAGNOSIS — I4819 Other persistent atrial fibrillation: Secondary | ICD-10-CM

## 2017-09-20 DIAGNOSIS — Z7901 Long term (current) use of anticoagulants: Secondary | ICD-10-CM

## 2017-09-20 DIAGNOSIS — I2583 Coronary atherosclerosis due to lipid rich plaque: Secondary | ICD-10-CM

## 2017-09-20 DIAGNOSIS — R0602 Shortness of breath: Secondary | ICD-10-CM

## 2017-09-20 NOTE — Patient Instructions (Addendum)
We will be checking the following labs today - BMET, CBC and BNP   Medication Instructions:    Continue with your current medicines. BUT  Go back to taking the Lasix just as needed - you have had gout attacks in the past with regular use.    Testing/Procedures To Be Arranged:  Echocardiogram  Follow-Up:   See Dr. Marlou Porch in about 2 to 3 months    Other Special Instructions:   Restrict your salt   Call about an alternative for the yeast infection. The Fluconazole interacts with your Eliquis    If you need a refill on your cardiac medications before your next appointment, please call your pharmacy.   Call the [place] office at [phone] if you have any questions, problems or concerns.

## 2017-09-21 ENCOUNTER — Encounter: Payer: Self-pay | Admitting: Obstetrics & Gynecology

## 2017-09-21 ENCOUNTER — Telehealth: Payer: Self-pay | Admitting: Obstetrics & Gynecology

## 2017-09-21 LAB — BASIC METABOLIC PANEL
BUN/Creatinine Ratio: 21 (ref 12–28)
BUN: 22 mg/dL (ref 8–27)
CO2: 26 mmol/L (ref 20–29)
Calcium: 10.3 mg/dL (ref 8.7–10.3)
Chloride: 98 mmol/L (ref 96–106)
Creatinine, Ser: 1.07 mg/dL — ABNORMAL HIGH (ref 0.57–1.00)
GFR calc Af Amer: 54 mL/min/{1.73_m2} — ABNORMAL LOW (ref >59)
GFR calc non Af Amer: 47 mL/min/{1.73_m2} — ABNORMAL LOW (ref >59)
Glucose: 103 mg/dL — ABNORMAL HIGH (ref 65–99)
Potassium: 4.9 mmol/L (ref 3.5–5.2)
Sodium: 139 mmol/L (ref 134–144)

## 2017-09-21 LAB — CBC
Hematocrit: 37.4 % (ref 34.0–46.6)
Hemoglobin: 12.1 g/dL (ref 11.1–15.9)
MCH: 30.4 pg (ref 26.6–33.0)
MCHC: 32.4 g/dL (ref 31.5–35.7)
MCV: 94 fL (ref 79–97)
Platelets: 325 10*3/uL (ref 150–379)
RBC: 3.98 x10E6/uL (ref 3.77–5.28)
RDW: 13.6 % (ref 12.3–15.4)
WBC: 11.6 10*3/uL — ABNORMAL HIGH (ref 3.4–10.8)

## 2017-09-21 LAB — PRO B NATRIURETIC PEPTIDE: NT-Pro BNP: 2309 pg/mL — ABNORMAL HIGH (ref 0–738)

## 2017-09-21 MED ORDER — TERCONAZOLE 0.4 % VA CREA: 1.0000 | TOPICAL_CREAM | Freq: Every day | VAGINAL | 0 refills | Status: DC

## 2017-09-21 NOTE — Telephone Encounter (Signed)
Patient says her cardiologist does not want her to take the medication prescribed for a yeast infection. Wondering what else she can take.

## 2017-09-21 NOTE — Telephone Encounter (Signed)
Reviewed with Dr. Sabra Heck:   1. Rx for Terazol 7, pv nightly for 7 nights.   2. #6 pessary ordered, will notify once received in office.   Spoke with patient, advised as seen above. F/u with [facility] for RX. Will return call to schedule OV once new pessary received in office. Patient verbalizes understanding and is agreeable.   Routing to provider for final review. Patient is agreeable to disposition. Will close encounter.  Cc: Lamont Snowball, RN

## 2017-09-21 NOTE — Telephone Encounter (Signed)
Call to patient. Advised order for Size 6 pessary ring with support was help due to this product having been ordered (08-23-18) and provided for patient (08-28-18).  Patient will look for this and see if can locate it. She will call back tomorrow with update.

## 2017-09-21 NOTE — Telephone Encounter (Signed)
Spoke with patient.   1. Seen by cardiology on 09/20/17, recommended alternative to diflucan d/t interaction with Eliquis, increased bleeding risk.   2. States #7 pessary in place, checking on status of #6 to be ordered, discussed at last OV. Patient scheduled with urology on 09/26/17.   Advised patient will review with Dr. Sabra Heck and return call with recommendations, patient is agreeable.   Dr. Sabra Heck -please advise on alternative RX to diflucan?

## 2017-09-22 ENCOUNTER — Telehealth: Payer: Self-pay | Admitting: Obstetrics & Gynecology

## 2017-09-22 ENCOUNTER — Other Ambulatory Visit: Payer: Self-pay | Admitting: *Deleted

## 2017-09-22 DIAGNOSIS — R05 Cough: Secondary | ICD-10-CM | POA: Diagnosis not present

## 2017-09-22 DIAGNOSIS — J209 Acute bronchitis, unspecified: Secondary | ICD-10-CM | POA: Diagnosis not present

## 2017-09-22 LAB — URINE CULTURE

## 2017-09-22 MED ORDER — SULFAMETHOXAZOLE-TRIMETHOPRIM 800-160 MG PO TABS: 1.0000 | ORAL_TABLET | Freq: Two times a day (BID) | ORAL | 0 refills | Status: AC

## 2017-09-22 NOTE — Telephone Encounter (Signed)
Patient is returning Sally's call. She found the pessary at home so another does not need to be ordered.

## 2017-09-22 NOTE — Telephone Encounter (Signed)
Patient states she is returning a call regarding a UTI.

## 2017-09-22 NOTE — Telephone Encounter (Signed)
I think she is seeing Dr. Matilde Sprang on Tuesday.  I can see her Monday for pessary placement.

## 2017-09-22 NOTE — Telephone Encounter (Signed)
Spoke with patient. Patient states that she is calling to discuss her urine culture results. States she was called this morning with results but wants to review again. Advised patient of results seen below. Patient verbalizes understanding. Aware rx for Bactrim DS bid x 3 days has been sent to pharmacy on file. Asking if this interacts with Eliquis. Reviewed with Up To Date and there is no interaction between the two medications. Patient verbalizes understanding.  Notes recorded by Polly Cobia, CMA on 09/22/2017 at 9:19 AM EST Pt notified. Verbalized understanding. Rx sent to pharmacy. ------  Notes recorded by Megan Salon, MD on 09/21/2017 at 11:20 PM EST Please let pt know her urine culture was positive for e coli. Ok to treat with bactrim ds bid x 3 days.  Routing to provider for final review. Patient agreeable to disposition. Will close encounter.

## 2017-09-22 NOTE — Telephone Encounter (Signed)
Call to patient. Left message on home number and cell number.  Advised that earlier message from patient received and reviewed by Dr Sabra Heck.  Recommends office visit before next urology appointment. Scheduled appointment for 245 on Monday, 09-25-17. Bring all devices with her.  Call back Monday if questions.   Encounter closed.

## 2017-09-22 NOTE — Telephone Encounter (Signed)
Patient left message that she has found size 6 pessary.  Currently being treated with Bactrim. Please advise on scheduling office visit.

## 2017-09-25 ENCOUNTER — Ambulatory Visit (INDEPENDENT_AMBULATORY_CARE_PROVIDER_SITE_OTHER): Payer: Medicare Other | Admitting: Obstetrics & Gynecology

## 2017-09-25 ENCOUNTER — Telehealth: Payer: Self-pay | Admitting: Obstetrics & Gynecology

## 2017-09-25 ENCOUNTER — Encounter: Payer: Self-pay | Admitting: Obstetrics & Gynecology

## 2017-09-25 ENCOUNTER — Other Ambulatory Visit: Payer: Self-pay | Admitting: Cardiology

## 2017-09-25 VITALS — BP 120/80 | HR 68 | Resp 18 | Ht 60.0 in | Wt 157.0 lb

## 2017-09-25 DIAGNOSIS — I2510 Atherosclerotic heart disease of native coronary artery without angina pectoris: Secondary | ICD-10-CM | POA: Diagnosis not present

## 2017-09-25 DIAGNOSIS — I2583 Coronary atherosclerosis due to lipid rich plaque: Secondary | ICD-10-CM | POA: Diagnosis not present

## 2017-09-25 DIAGNOSIS — Z960 Presence of urogenital implants: Secondary | ICD-10-CM | POA: Diagnosis not present

## 2017-09-25 DIAGNOSIS — Z978 Presence of other specified devices: Secondary | ICD-10-CM

## 2017-09-25 DIAGNOSIS — N8111 Cystocele, midline: Secondary | ICD-10-CM

## 2017-09-25 DIAGNOSIS — N812 Incomplete uterovaginal prolapse: Secondary | ICD-10-CM

## 2017-09-25 DIAGNOSIS — R339 Retention of urine, unspecified: Secondary | ICD-10-CM

## 2017-09-25 NOTE — Progress Notes (Signed)
GYNECOLOGY  VISIT  CC:   Pessary follow up  HPI: 84 y.o. [id number] Married Caucasian female here for pessary follow-up.  Found the #6 pessary by [employer] (stiffer pink pessary) and had her husband place it yesterday.  Has been in all night and day without any trouble.  Feels like it is keeping the bladder adequately supported without any bladder slipping around the pessary as was occurring with the #5.  Denies vaginal bleeding or discharge.  Still has foley catheter.  Seeing Dr. Matilde Sprang tomorrow and hopefully catheter will be removed and left out.    Was recently treated for yeast vaginitis. Discharge is much better.  Using vaginal preparation.    Urine culture 09/18/17 showed e. Coli.  Was treated with Bactrim.  Feels urine is abnormal color today.  Has small clot it in as well.  Likely related to indwelling foley catheter as she reports urine is always clear in the morning and very often darker color in the afternoon.  GYNECOLOGIC HISTORY: Patient's last menstrual period was 09/12/1973 (approximate). Contraception: post menopausal  Menopausal hormone therapy: none  Patient Active Problem List   Diagnosis Date Noted  . Diplopia 01/29/2016  . GERD (gastroesophageal reflux disease) 01/29/2016  . Cerebrovascular accident (CVA) due to embolism of left middle cerebral artery ([place]) 01/29/2016  . Persistent atrial fibrillation ([place])   . Bleeding gastrointestinal   . Gait disturbance, post-stroke 01/27/2016  . Fall   . Secondary hypertension, unspecified   . Cerebral infarction due to thrombosis of left middle cerebral artery (HCC) s/p mechanical thrombectomy   . Malnutrition of moderate degree 01/09/2016  . GI bleed 01/06/2016  . Chronic atrial fibrillation ([place]) 06/10/2015  . Pulmonary hypertension, secondary ([place]) 06/10/2015  . Chronic anticoagulation 06/10/2015  . Hyperlipidemia 06/10/2015  . Essential hypertension 06/10/2015  . History of stroke 06/10/2015  . Coronary artery disease due  to lipid rich plaque 06/10/2015  . Afib ([place])   . Elevated uric acid in blood   . Arthritis of ankle   . Pulmonary HTN ([place])     Past Medical History:  Diagnosis Date  . Afib ([place])   . Arthritis of ankle   . CAD (coronary artery disease)   . Cancer [facility]) 2012   breast cancer-right  . Compression fracture of T12 vertebra (HCC)   . Diverticulosis   . Edema   . Elevated uric acid in blood   . GI bleed 12/2015  . Hyperlipidemia   . Hypertension   . Long-term (current) use of anticoagulants   . Moderate mitral regurgitation   . Pulmonary HTN ([place])   . Stroke ([place]) 01/25/2016  . TIA (transient ischemic attack) 09/2011  . Urinary incontinence     Past Surgical History:  Procedure Laterality Date  . APPENDECTOMY    . BREAST SURGERY  2012   Rt.mastectomy--[place], [place]  2013  . CATARACT EXTRACTION, BILATERAL    . DILATION AND CURETTAGE OF UTERUS     in her early 8's for DUB  . ESOPHAGOGASTRODUODENOSCOPY Left 01/08/2016   Procedure: ESOPHAGOGASTRODUODENOSCOPY (EGD);  Surgeon: Arta Silence, MD;  Location: [facility];  Service: Endoscopy;  Laterality: Left;  . GALLBLADDER SURGERY    . MASTECTOMY Right   . RADIOLOGY WITH ANESTHESIA N/A 01/25/2016   Procedure: RADIOLOGY WITH ANESTHESIA;  Surgeon: Luanne Bras, MD;  Location: [facility];  Service: Radiology;  Laterality: N/A;  . REPLACEMENT TOTAL KNEE BILATERAL    . TONSILLECTOMY AND ADENOIDECTOMY     as a  child    MEDS:   Current Outpatient Medications on File Prior to Visit  Medication Sig Dispense Refill  . acetaminophen (TYLENOL) 500 MG tablet Take 500-1,000 mg by mouth every 12 (twelve) hours as needed (pain).     Marland Kitchen amoxicillin (AMOXIL) 500 MG capsule 500 mg. Patient takes 4 tablets by mouth once prior to dental procedures    . apixaban (ELIQUIS) 5 MG TABS tablet Take 1 tablet (5 mg total) by mouth 2 (two) times daily. 180 tablet 1  . atorvastatin (LIPITOR) 10 MG tablet Take 1 tablet (10 mg  total) by mouth daily. 90 tablet 1  . calcium-vitamin D 250-100 MG-UNIT tablet Take 2 tablets by mouth 2 (two) times daily.     . cephALEXin (KEFLEX) 500 MG capsule Take 1 capsule (500 mg total) by mouth 2 (two) times daily. 14 capsule 0  . Cholecalciferol (VITAMIN D-3) 1000 units CAPS Take 1,000 Units by mouth daily.    . Cholecalciferol (VITAMIN D3) 1000 units CHEW Chew by mouth.    . diltiazem (CARDIZEM) 60 MG tablet Take 1 tablet (60 mg total) by mouth 3 (three) times daily. 270 tablet 1  . furosemide (LASIX) 20 MG tablet Take 20 mg by mouth daily as needed.    . metoprolol tartrate (LOPRESSOR) 100 MG tablet Take 1 tablet (100 mg total) by mouth 2 (two) times daily. 180 tablet 1  . Multiple Vitamins-Minerals (MULTIVITAMIN ADULT PO) Take 1 tablet by mouth daily.     Marland Kitchen nystatin-triamcinolone (MYCOLOG II) cream Apply externally 2 (two) times daily for one week. 30 g 0  . omeprazole (PRILOSEC) 40 MG capsule Take 40 mg by mouth every other day.     Marland Kitchen PROAIR HFA 108 (90 Base) MCG/ACT inhaler Inhale 1-2 puffs into the lungs every 6 (six) hours as needed for wheezing or shortness of breath.     . sulfamethoxazole-trimethoprim (BACTRIM DS,SEPTRA DS) 800-160 MG tablet Take 1 tablet by mouth 2 (two) times daily for 3 days. 6 tablet 0  . tamsulosin (FLOMAX) 0.4 MG CAPS capsule Take 0.4 mg by mouth daily.    Marland Kitchen terconazole (TERAZOL 7) 0.4 % vaginal cream Place 1 applicator vaginally at bedtime. 45 g 0  . vitamin C (ASCORBIC ACID) 500 MG tablet Take 1,000 mg by mouth daily.     Marland Kitchen azithromycin (ZITHROMAX) 250 MG tablet as directed.  0   No current facility-administered medications on file prior to visit.     ALLERGIES: Valium [diazepam]; Amiodarone hcl [amiodarone]; Compazine [prochlorperazine]; Other; Thorazine [chlorpromazine]; Zometa [zoledronic acid]; and Ciprofloxacin  Family History  Problem Relation Age of Onset  . Asthma Mother   . CVA Father   . Atrial fibrillation Sister        HAS PACER   . Pneumonia Brother   . Cancer Daughter        COLON CANCER  . Cancer Maternal Grandmother        breast cancer    SH:  Married, non smoker  Review of Systems  All other systems reviewed and are negative.   PHYSICAL EXAMINATION:    BP 120/80 (BP Location: Left Arm, Patient Position: Sitting, Cuff Size: Large)   Pulse 68   Resp 18   Ht 5' (1.524 m)   Wt 157 lb (71.2 kg)   LMP 09/12/1973 (Approximate)   BMI 30.66 kg/m     General appearance: alert, cooperative and appears stated age Abdomen: soft, non-tender; bowel sounds normal; no masses,  no organomegaly  Pelvic:  External genitalia:  no lesions              Urethra:  normal appearing urethra with no masses, tenderness or lesions              Bartholins and Skenes: normal                 Vagina: normal appearing vagina with normal color and discharge, no lesions              Cervix: no lesions              Bimanual Exam:  Uterus:  normal size, contour, position, consistency, mobility, non-tender              Adnexa: no mass, fullness, tenderness  #6 incontinence with ring present with good resolution of cystocele and uterine prolapse.    Chaperone was present for exam.  Assessment: Cystocele with #6 pessary in placed Indwelling foley catheter, seeing Dr. Matilde Sprang tomorrow.  Hopefully removal with be successful. Hematuria likely present due to catheter.  Will check urine once catheter is out  Plan: Recheck 2 months   ~15 minutes spent with patient >50% of time was in face to face discussion of above.

## 2017-09-25 NOTE — Telephone Encounter (Signed)
Call to patient. States she is confirming appointment for today. Advised to bring all pessaries with her.   Routing to provider for final review. Patient agreeable to disposition. Will close encounter.

## 2017-09-25 NOTE — Telephone Encounter (Signed)
Patient is returning a call to Sally. °

## 2017-09-26 DIAGNOSIS — N3020 Other chronic cystitis without hematuria: Secondary | ICD-10-CM | POA: Diagnosis not present

## 2017-09-26 DIAGNOSIS — R338 Other retention of urine: Secondary | ICD-10-CM | POA: Diagnosis not present

## 2017-09-27 ENCOUNTER — Ambulatory Visit (HOSPITAL_COMMUNITY): Payer: Medicare Other | Attending: Internal Medicine

## 2017-09-27 ENCOUNTER — Other Ambulatory Visit: Payer: Self-pay

## 2017-09-27 DIAGNOSIS — I081 Rheumatic disorders of both mitral and tricuspid valves: Secondary | ICD-10-CM | POA: Diagnosis not present

## 2017-09-27 DIAGNOSIS — R0602 Shortness of breath: Secondary | ICD-10-CM | POA: Diagnosis not present

## 2017-09-27 DIAGNOSIS — I4819 Other persistent atrial fibrillation: Secondary | ICD-10-CM

## 2017-09-27 DIAGNOSIS — I481 Persistent atrial fibrillation: Secondary | ICD-10-CM | POA: Diagnosis not present

## 2017-09-28 ENCOUNTER — Encounter: Payer: Self-pay | Admitting: Physician Assistant

## 2017-10-04 ENCOUNTER — Telehealth: Payer: Self-pay | Admitting: *Deleted

## 2017-10-04 NOTE — Telephone Encounter (Signed)
Patient would like to speak with nurse regarding her visit with Dr. Vikki Ports.

## 2017-10-04 NOTE — Telephone Encounter (Signed)
Spoke with patient. Patient states she was seen with Dr.MacDiarmid to have her urinary catheter removed with her #6 pessary in place to see if she could void on her own. Reports she was unsuccessful. Dr.MacDiarmid recommends she return to our office for Dr.Miller to place a #7 pessary and that she return to see him after to have the catheter removed again to see if she can void on her own. Appointment scheduled for pessary placement on 10/06/2017 at 10:45 am with Dr.Miller. Patient is agreeable to date and time. Will call Dr.MacDiarmid's office to set up an appointment with them as well.  Routing to provider for final review. Patient agreeable to disposition. Will close encounter.

## 2017-10-05 ENCOUNTER — Ambulatory Visit: Payer: Medicare Other | Admitting: Cardiology

## 2017-10-06 ENCOUNTER — Ambulatory Visit (INDEPENDENT_AMBULATORY_CARE_PROVIDER_SITE_OTHER): Payer: Medicare Other | Admitting: Obstetrics & Gynecology

## 2017-10-06 ENCOUNTER — Encounter: Payer: Self-pay | Admitting: Obstetrics & Gynecology

## 2017-10-06 VITALS — BP 108/70 | HR 64 | Resp 16 | Wt 151.0 lb

## 2017-10-06 DIAGNOSIS — Z960 Presence of urogenital implants: Secondary | ICD-10-CM | POA: Diagnosis not present

## 2017-10-06 DIAGNOSIS — R339 Retention of urine, unspecified: Secondary | ICD-10-CM

## 2017-10-06 DIAGNOSIS — I2583 Coronary atherosclerosis due to lipid rich plaque: Secondary | ICD-10-CM | POA: Diagnosis not present

## 2017-10-06 DIAGNOSIS — N812 Incomplete uterovaginal prolapse: Secondary | ICD-10-CM

## 2017-10-06 DIAGNOSIS — I2510 Atherosclerotic heart disease of native coronary artery without angina pectoris: Secondary | ICD-10-CM

## 2017-10-06 DIAGNOSIS — Z978 Presence of other specified devices: Secondary | ICD-10-CM

## 2017-10-06 NOTE — Progress Notes (Signed)
GYNECOLOGY  VISIT  CC:   Place #7 pessary  HPI: 84 y.o. G1P3003 Married Caucasian female here for pessary adjustment.  Has #6 incontinence ring with support.  Feels this is working well for her except she has not been able to void when catheter was removed.  Dr. Matilde Sprang recommended she have the #7 placed again to see if this helps with voiding.  Learning self catheterization has been discussed.  Pt has a lot of questions about this.  Answered all of these.  Feel is pessary is fitting well and bladder is not prolapsing around, then self caths could be a way to retrain her bladder into working.    GYNECOLOGIC HISTORY: Patient's last menstrual period was 09/12/1973 (approximate). Contraception: post menopausal  Menopausal hormone therapy: none  Patient Active Problem List   Diagnosis Date Noted  . Diplopia 01/29/2016  . GERD (gastroesophageal reflux disease) 01/29/2016  . Cerebrovascular accident (CVA) due to embolism of left middle cerebral artery ([place]) 01/29/2016  . Persistent atrial fibrillation ([place])   . Bleeding gastrointestinal   . Gait disturbance, post-stroke 01/27/2016  . Fall   . Secondary hypertension, unspecified   . Cerebral infarction due to thrombosis of left middle cerebral artery (HCC) s/p mechanical thrombectomy   . Malnutrition of moderate degree 01/09/2016  . GI bleed 01/06/2016  . Chronic atrial fibrillation ([place]) 06/10/2015  . Pulmonary hypertension, secondary ([place]) 06/10/2015  . Chronic anticoagulation 06/10/2015  . Hyperlipidemia 06/10/2015  . Essential hypertension 06/10/2015  . History of stroke 06/10/2015  . Coronary artery disease due to lipid rich plaque 06/10/2015  . Afib ([place])   . Elevated uric acid in blood   . Arthritis of ankle   . Pulmonary HTN ([place])     Past Medical History:  Diagnosis Date  . Afib ([place])   . Arthritis of ankle   . CAD (coronary artery disease)   . Cancer [facility]) 2012   breast cancer-right  . Compression fracture of T12  vertebra (HCC)   . Diverticulosis   . Edema   . Elevated uric acid in blood   . GI bleed 12/2015  . Hyperlipidemia   . Hypertension   . Long-term (current) use of anticoagulants   . Moderate mitral regurgitation   . Pulmonary HTN ([place])    Echo 1/19: EF 60-65, no RWMA, Ao sclerosis, trivial AI, mild MR, mod BAE, mod TR, PASP 49  . Stroke ([place]) 01/25/2016  . TIA (transient ischemic attack) 09/2011  . Urinary incontinence     Past Surgical History:  Procedure Laterality Date  . APPENDECTOMY    . BREAST SURGERY  2012   Rt.mastectomy--[place], [place]  2013  . CATARACT EXTRACTION, BILATERAL    . DILATION AND CURETTAGE OF UTERUS     in her early 30's for DUB  . ESOPHAGOGASTRODUODENOSCOPY Left 01/08/2016   Procedure: ESOPHAGOGASTRODUODENOSCOPY (EGD);  Surgeon: Arta Silence, MD;  Location: [facility];  Service: Endoscopy;  Laterality: Left;  . GALLBLADDER SURGERY    . MASTECTOMY Right   . RADIOLOGY WITH ANESTHESIA N/A 01/25/2016   Procedure: RADIOLOGY WITH ANESTHESIA;  Surgeon: Luanne Bras, MD;  Location: [facility];  Service: Radiology;  Laterality: N/A;  . REPLACEMENT TOTAL KNEE BILATERAL    . TONSILLECTOMY AND ADENOIDECTOMY     as a child    MEDS:   Current Outpatient Medications on File Prior to Visit  Medication Sig Dispense Refill  . acetaminophen (TYLENOL) 500 MG tablet Take 500-1,000 mg by mouth every 12 (  twelve) hours as needed (pain).     Marland Kitchen apixaban (ELIQUIS) 5 MG TABS tablet Take 1 tablet (5 mg total) by mouth 2 (two) times daily. 180 tablet 1  . atorvastatin (LIPITOR) 10 MG tablet Take 1 tablet (10 mg total) by mouth daily. 90 tablet 1  . calcium-vitamin D 250-100 MG-UNIT tablet Take 2 tablets by mouth 2 (two) times daily.     . Cholecalciferol (VITAMIN D-3) 1000 units CAPS Take 1,000 Units by mouth daily.    Marland Kitchen diltiazem (CARDIZEM) 60 MG tablet Take 1 tablet (60 mg total) by mouth 3 (three) times daily. 270 tablet 3  . furosemide (LASIX) 20  MG tablet Take 20 mg by mouth daily as needed.    . metoprolol tartrate (LOPRESSOR) 100 MG tablet Take 1 tablet (100 mg total) by mouth 2 (two) times daily. 180 tablet 3  . Multiple Vitamins-Minerals (MULTIVITAMIN ADULT PO) Take 1 tablet by mouth daily.     Marland Kitchen nystatin-triamcinolone (MYCOLOG II) cream Apply externally 2 (two) times daily for one week. 30 g 0  . omeprazole (PRILOSEC) 40 MG capsule Take 40 mg by mouth every other day.     Marland Kitchen PROAIR HFA 108 (90 Base) MCG/ACT inhaler Inhale 1-2 puffs into the lungs every 6 (six) hours as needed for wheezing or shortness of breath.     . terconazole (TERAZOL 7) 0.4 % vaginal cream Place 1 applicator vaginally at bedtime. 45 g 0  . vitamin C (ASCORBIC ACID) 500 MG tablet Take 1,000 mg by mouth daily.     Marland Kitchen amoxicillin (AMOXIL) 500 MG capsule 500 mg. Patient takes 4 tablets by mouth once prior to dental procedures     No current facility-administered medications on file prior to visit.     ALLERGIES: Valium [diazepam]; Amiodarone hcl [amiodarone]; Compazine [prochlorperazine]; Other; Thorazine [chlorpromazine]; Zometa [zoledronic acid]; and Ciprofloxacin  Family History  Problem Relation Age of Onset  . Asthma Mother   . CVA Father   . Atrial fibrillation Sister        HAS PACER  . Pneumonia Brother   . Cancer Daughter        COLON CANCER  . Cancer Maternal Grandmother        breast cancer    SH:  Widowed, non smoker  Review of Systems  All other systems reviewed and are negative.   PHYSICAL EXAMINATION:    BP 108/70 (BP Location: Left Arm, Patient Position: Sitting, Cuff Size: Large)   Pulse 64   Resp 16   Wt 151 lb (68.5 kg)   LMP 09/12/1973 (Approximate)   BMI 29.49 kg/m     General appearance: alert, cooperative and appears stated age  Pelvic: External genitalia:  no lesions              Urethra:  normal appearing urethra with no masses, tenderness or lesions              Bartholins and Skenes: normal                  Vagina: normal appearing vagina with normal color and discharge, no lesions              Cervix: no lesions              Bimanual Exam:  Uterus:  normal size, contour, position, consistency, mobility, non-tender              Adnexa: no mass, fullness, tenderness  #6 pessary was removed  and there was good reduction of prolapse present.  #7 pessary placed without difficulty.  She felt comfortable walking and voiding with the pessary.  Chaperone was present for exam.  Assessment: History of uterovaginal prolapse with urinary retension Current indwelling foley catheter  Plan: Pt is going to go get hair done and then run some errands.  Advised if she starts to have any issues with discomfort, that she should just come right back and I will remove pessary and placed #6 again.  Pt pleased with plan.   ~15 minutes spent with patient >50% of time was in face to face discussion of above.

## 2017-10-10 DIAGNOSIS — N3020 Other chronic cystitis without hematuria: Secondary | ICD-10-CM | POA: Diagnosis not present

## 2017-10-10 DIAGNOSIS — R338 Other retention of urine: Secondary | ICD-10-CM | POA: Diagnosis not present

## 2017-10-11 DIAGNOSIS — B338 Other specified viral diseases: Secondary | ICD-10-CM | POA: Diagnosis not present

## 2017-10-11 DIAGNOSIS — R509 Fever, unspecified: Secondary | ICD-10-CM | POA: Diagnosis not present

## 2017-10-17 ENCOUNTER — Telehealth: Payer: Self-pay | Admitting: *Deleted

## 2017-10-17 NOTE — Telephone Encounter (Signed)
Patient in 04 recall for 08/2017. Please contact patient regarding scheduling Thanks

## 2017-10-24 DIAGNOSIS — R338 Other retention of urine: Secondary | ICD-10-CM | POA: Diagnosis not present

## 2017-10-25 NOTE — Telephone Encounter (Signed)
Patient is overdue for her yearly mammogram.  She usually has this done at [facility] ordered through Dr. Baldomero Lamy. I recommend she proceed with the mammogram.  She can still pursue the breast MRI as an added tool for breast cancer screening.   The final decision will be between her and her oncologist.

## 2017-10-25 NOTE — Telephone Encounter (Signed)
Spoke with patient about scheduling mammogram. She states she is returning to [place] just for follow-up with her oncologist and a breast MRI the first part of March. Advised I would let Dr.Silva know. She thanked me for calling and said she was glad Dr.Silva was back in the office. Routed to Dr.Silva and Margaretha Sheffield.

## 2017-10-25 NOTE — Telephone Encounter (Signed)
Dr. Quincy Simmonds, Please advise on recall status Thanks  Margaretha Sheffield

## 2017-10-27 DIAGNOSIS — R338 Other retention of urine: Secondary | ICD-10-CM | POA: Diagnosis not present

## 2017-10-27 DIAGNOSIS — N3946 Mixed incontinence: Secondary | ICD-10-CM | POA: Diagnosis not present

## 2017-10-27 DIAGNOSIS — N3020 Other chronic cystitis without hematuria: Secondary | ICD-10-CM | POA: Diagnosis not present

## 2017-11-08 DIAGNOSIS — J069 Acute upper respiratory infection, unspecified: Secondary | ICD-10-CM | POA: Diagnosis not present

## 2017-11-08 DIAGNOSIS — R509 Fever, unspecified: Secondary | ICD-10-CM | POA: Diagnosis not present

## 2017-11-08 DIAGNOSIS — R05 Cough: Secondary | ICD-10-CM | POA: Diagnosis not present

## 2017-11-13 DIAGNOSIS — R05 Cough: Secondary | ICD-10-CM | POA: Diagnosis not present

## 2017-11-13 DIAGNOSIS — R509 Fever, unspecified: Secondary | ICD-10-CM | POA: Diagnosis not present

## 2017-11-13 DIAGNOSIS — J069 Acute upper respiratory infection, unspecified: Secondary | ICD-10-CM | POA: Diagnosis not present

## 2017-11-13 DIAGNOSIS — N200 Calculus of kidney: Secondary | ICD-10-CM | POA: Diagnosis not present

## 2017-11-13 DIAGNOSIS — R3911 Hesitancy of micturition: Secondary | ICD-10-CM | POA: Diagnosis not present

## 2017-11-15 DIAGNOSIS — Z853 Personal history of malignant neoplasm of breast: Secondary | ICD-10-CM | POA: Diagnosis not present

## 2017-11-15 DIAGNOSIS — N6489 Other specified disorders of breast: Secondary | ICD-10-CM | POA: Diagnosis not present

## 2017-11-15 DIAGNOSIS — Z79899 Other long term (current) drug therapy: Secondary | ICD-10-CM | POA: Diagnosis not present

## 2017-11-15 DIAGNOSIS — Z1231 Encounter for screening mammogram for malignant neoplasm of breast: Secondary | ICD-10-CM | POA: Diagnosis not present

## 2017-11-17 ENCOUNTER — Ambulatory Visit (INDEPENDENT_AMBULATORY_CARE_PROVIDER_SITE_OTHER): Payer: Medicare Other | Admitting: Obstetrics & Gynecology

## 2017-11-17 ENCOUNTER — Encounter: Payer: Self-pay | Admitting: Obstetrics & Gynecology

## 2017-11-17 VITALS — BP 132/80 | HR 64 | Resp 18 | Wt 151.0 lb

## 2017-11-17 DIAGNOSIS — N812 Incomplete uterovaginal prolapse: Secondary | ICD-10-CM

## 2017-11-17 DIAGNOSIS — N8111 Cystocele, midline: Secondary | ICD-10-CM

## 2017-11-17 DIAGNOSIS — I2583 Coronary atherosclerosis due to lipid rich plaque: Secondary | ICD-10-CM

## 2017-11-17 DIAGNOSIS — I2510 Atherosclerotic heart disease of native coronary artery without angina pectoris: Secondary | ICD-10-CM

## 2017-11-17 NOTE — Progress Notes (Signed)
GYNECOLOGY  VISIT  CC:   Pessary check  HPI: 84 y.o. G20P3003 Married Caucasian female here for pessary check.  Noticing the pessary a little more when she walks and it has come out twice but she puts it right back in the vagina.  Catheter was removed on February 12th.   She is not having trouble voiding.  She is currently being treated for urinary tract infection again.  She is so happy not to have the catheter present.  Denies vaginal bleeding or discharge.  She has no back pain or pelvic pain.  GYNECOLOGIC HISTORY: Patient's last menstrual period was 09/12/1973 (approximate). Contraception: post menopausal  Menopausal hormone therapy: none  Patient Active Problem List   Diagnosis Date Noted  . Diplopia 01/29/2016  . GERD (gastroesophageal reflux disease) 01/29/2016  . Cerebrovascular accident (CVA) due to embolism of left middle cerebral artery ([place]) 01/29/2016  . Persistent atrial fibrillation ([place])   . Bleeding gastrointestinal   . Gait disturbance, post-stroke 01/27/2016  . Fall   . Secondary hypertension, unspecified   . Cerebral infarction due to thrombosis of left middle cerebral artery (HCC) s/p mechanical thrombectomy   . Malnutrition of moderate degree 01/09/2016  . GI bleed 01/06/2016  . Chronic atrial fibrillation ([place]) 06/10/2015  . Pulmonary hypertension, secondary ([place]) 06/10/2015  . Chronic anticoagulation 06/10/2015  . Hyperlipidemia 06/10/2015  . Essential hypertension 06/10/2015  . History of stroke 06/10/2015  . Coronary artery disease due to lipid rich plaque 06/10/2015  . Afib ([place])   . Elevated uric acid in blood   . Arthritis of ankle   . Pulmonary HTN ([place])     Past Medical History:  Diagnosis Date  . Afib ([place])   . Arthritis of ankle   . CAD (coronary artery disease)   . Cancer [facility]) 2012   breast cancer-right  . Compression fracture of T12 vertebra (HCC)   . Diverticulosis   . Edema   . Elevated uric acid in blood   . GI bleed 12/2015  .  Hyperlipidemia   . Hypertension   . Long-term (current) use of anticoagulants   . Moderate mitral regurgitation   . Pulmonary HTN ([place])    Echo 1/19: EF 60-65, no RWMA, Ao sclerosis, trivial AI, mild MR, mod BAE, mod TR, PASP 49  . Stroke ([place]) 01/25/2016  . TIA (transient ischemic attack) 09/2011  . Urinary incontinence     Past Surgical History:  Procedure Laterality Date  . APPENDECTOMY    . BREAST SURGERY  2012   Rt.mastectomy--[place], [place]  2013  . CATARACT EXTRACTION, BILATERAL    . DILATION AND CURETTAGE OF UTERUS     in her early 5's for DUB  . ESOPHAGOGASTRODUODENOSCOPY Left 01/08/2016   Procedure: ESOPHAGOGASTRODUODENOSCOPY (EGD);  Surgeon: Arta Silence, MD;  Location: [facility];  Service: Endoscopy;  Laterality: Left;  . GALLBLADDER SURGERY    . MASTECTOMY Right   . RADIOLOGY WITH ANESTHESIA N/A 01/25/2016   Procedure: RADIOLOGY WITH ANESTHESIA;  Surgeon: Luanne Bras, MD;  Location: [facility];  Service: Radiology;  Laterality: N/A;  . REPLACEMENT TOTAL KNEE BILATERAL    . TONSILLECTOMY AND ADENOIDECTOMY     as a child    MEDS:   Current Outpatient Medications on File Prior to Visit  Medication Sig Dispense Refill  . acetaminophen (TYLENOL) 500 MG tablet Take 500-1,000 mg by mouth every 12 (twelve) hours as needed (pain).     Marland Kitchen amoxicillin (AMOXIL) 500 MG capsule 500 mg.  Patient takes 4 tablets by mouth once prior to dental procedures    . apixaban (ELIQUIS) 5 MG TABS tablet Take 1 tablet (5 mg total) by mouth 2 (two) times daily. 180 tablet 1  . atorvastatin (LIPITOR) 10 MG tablet Take 1 tablet (10 mg total) by mouth daily. 90 tablet 1  . calcium-vitamin D 250-100 MG-UNIT tablet Take 2 tablets by mouth 2 (two) times daily.     . Cholecalciferol (VITAMIN D-3) 1000 units CAPS Take 1,000 Units by mouth daily.    Marland Kitchen diltiazem (CARDIZEM) 60 MG tablet Take 1 tablet (60 mg total) by mouth 3 (three) times daily. 270 tablet 3  . furosemide  (LASIX) 20 MG tablet Take 20 mg by mouth daily as needed.    . metoprolol tartrate (LOPRESSOR) 100 MG tablet Take 1 tablet (100 mg total) by mouth 2 (two) times daily. 180 tablet 3  . Multiple Vitamins-Minerals (MULTIVITAMIN ADULT PO) Take 1 tablet by mouth daily.     . nitrofurantoin, macrocrystal-monohydrate, (MACROBID) 100 MG capsule Take 1 capsule by mouth 2 (two) times daily.  0  . omeprazole (PRILOSEC) 40 MG capsule Take 40 mg by mouth every other day.     Marland Kitchen PROAIR HFA 108 (90 Base) MCG/ACT inhaler Inhale 1-2 puffs into the lungs every 6 (six) hours as needed for wheezing or shortness of breath.     . terconazole (TERAZOL 7) 0.4 % vaginal cream Place 1 applicator vaginally at bedtime. 45 g 0  . vitamin C (ASCORBIC ACID) 500 MG tablet Take 1,000 mg by mouth daily.      No current facility-administered medications on file prior to visit.     ALLERGIES: Valium [diazepam]; Amiodarone hcl [amiodarone]; Compazine [prochlorperazine]; Other; Thorazine [chlorpromazine]; Zometa [zoledronic acid]; and Ciprofloxacin  Family History  Problem Relation Age of Onset  . Asthma Mother   . CVA Father   . Atrial fibrillation Sister        HAS PACER  . Pneumonia Brother   . Cancer Daughter        COLON CANCER  . Cancer Maternal Grandmother        breast cancer    SH: Married, non-smoker  Review of Systems  All other systems reviewed and are negative.   PHYSICAL EXAMINATION:    BP 132/80 (BP Location: Right Arm, Patient Position: Sitting, Cuff Size: Normal)   Pulse 64   Resp 18   Wt 151 lb (68.5 kg)   LMP 09/12/1973 (Approximate)   BMI 29.49 kg/m     General appearance: alert, cooperative and appears stated age Abdomen: soft, non-tender; bowel sounds normal; no masses,  no organomegaly  Pelvic: External genitalia:  no lesions              Urethra:  normal appearing urethra with no masses, tenderness or lesions              Bartholins and Skenes: normal                 Vagina: normal  appearing vagina with normal color and discharge, no lesions, #7 incontinence ring with support removed.  Pessary was cleansed.  3rd degree cystocele with incomplete uterine prolapse present when pessary is removed.                Cervix: no lesions              Bimanual Exam:  Uterus:  normal size, contour, position, consistency, mobility, non-tender  Adnexa: no mass, fullness, tenderness   Pessary was replaced with good reduction of prolapse.  Pt tolerated procedure well.  Pt aware to call if feels bladder is coming around pessary or if comes out and she does not feel like the placement is good as she may not be getting the pessary behind the pubic bone.  Will plan to recheck in 2-3 months.  Advised pt that Dr. Quincy Simmonds has returned and she may return to see her.    Chaperone was present for exam.  Assessment: Cystocele with incomplete uterine prolapse Incontinence ring with support pessary use H/O indwelling foley catheter, finally removed Currently begin treated for UTI  Plan: Return in 3 months or sooner with any issues   ~15 minutes spent with patient >50% of time was in face to face discussion of above.

## 2017-11-23 ENCOUNTER — Ambulatory Visit: Payer: Medicare Other | Admitting: Obstetrics & Gynecology

## 2017-11-23 ENCOUNTER — Encounter: Payer: Self-pay | Admitting: Cardiology

## 2017-11-23 ENCOUNTER — Ambulatory Visit (INDEPENDENT_AMBULATORY_CARE_PROVIDER_SITE_OTHER): Payer: Medicare Other | Admitting: Cardiology

## 2017-11-23 VITALS — BP 128/86 | HR 63 | Ht 61.0 in | Wt 153.4 lb

## 2017-11-23 DIAGNOSIS — I2510 Atherosclerotic heart disease of native coronary artery without angina pectoris: Secondary | ICD-10-CM | POA: Diagnosis not present

## 2017-11-23 DIAGNOSIS — I2583 Coronary atherosclerosis due to lipid rich plaque: Secondary | ICD-10-CM | POA: Diagnosis not present

## 2017-11-23 DIAGNOSIS — I6529 Occlusion and stenosis of unspecified carotid artery: Secondary | ICD-10-CM

## 2017-11-23 DIAGNOSIS — I482 Chronic atrial fibrillation, unspecified: Secondary | ICD-10-CM

## 2017-11-23 DIAGNOSIS — I6523 Occlusion and stenosis of bilateral carotid arteries: Secondary | ICD-10-CM | POA: Diagnosis not present

## 2017-11-23 DIAGNOSIS — Z8673 Personal history of transient ischemic attack (TIA), and cerebral infarction without residual deficits: Secondary | ICD-10-CM

## 2017-11-23 NOTE — Progress Notes (Signed)
Cardiology Office Note   Date:  11/23/2017   ID:  Adrienne [name], DOB 1927-12-16, MRN [id number]  PCP:  Adrienne Lass, MD  Cardiologist:   Adrienne Furbish, MD     History of Present Illness: Adrienne [name] is a 84 y.o. female (I see her husband, from [place]) here for follow up of atrial fibrillation, pulmonary hypertension, coronary artery disease, mitral regurgitation on chronic anticoagulation patient of Dr. Kathyrn [name].  Admitted in April 2017 with intermittent bloody stools. Anticoagulation was placed on hold. GI work up unremarkable (Hiatal hernia and diverticulosis). On Jan 25, 2016 had right side weakness and was found in bathroom by husband. Coumadin was restarted about a week prior to this, INR 1.5.Left MCA infarct. Interventional radiology. Transitioned to Eliquis on May 18.    Atrial fibrillation: In the past she has tried amiodarone but now has this listed under allergies. Currently under rate control strategy. Taking diltiazem, metoprolol. Had minor stroke years ago on Zometa in 2013 breast cancer. Right breast.   Coronary artery disease: Detected on catheterization in February 2013, nonobstructive. Echo on 09/2014 showed moderate mitral regurgitation. Normal pulmonary pressures.  Hyperlipidemia-has had aversion to statins in the past. Used to take [employer].  She has had prior visits with Dr. Sabra [name] for dizziness. Vertigo-like symptoms. During that visit she was concerned about cardiology referral for management of A. fib and Coumadin.  Pulmonary hypertension: secondary, been steady for years. In fact last echocardiogram reassuring with no significant elevation pressures.  Having SOB. Chronic. Dyspnea  11/23/17 - spoke about carotids, reviewed studies, stroke. AFIB discussion. No CP, no SOB. Edema chronic.   Past Medical History:  Diagnosis Date  . Afib ([place])   . Arthritis of ankle   . CAD (coronary artery disease)   . Cancer [facility]) 2012   breast cancer-right    . Compression fracture of T12 vertebra (HCC)   . Diverticulosis   . Edema   . Elevated uric acid in blood   . GI bleed 12/2015  . Hyperlipidemia   . Hypertension   . Long-term (current) use of anticoagulants   . Moderate mitral regurgitation   . Pulmonary HTN ([place])    Echo 1/19: EF 60-65, no RWMA, Ao sclerosis, trivial AI, mild MR, mod BAE, mod TR, PASP 49  . Stroke ([place]) 01/25/2016  . TIA (transient ischemic attack) 09/2011  . Urinary incontinence     Past Surgical History:  Procedure Laterality Date  . APPENDECTOMY    . BREAST SURGERY  2012   Rt.mastectomy--[place]  2013  . CATARACT EXTRACTION, BILATERAL    . DILATION AND CURETTAGE OF UTERUS     in her early 3's for DUB  . ESOPHAGOGASTRODUODENOSCOPY Left 01/08/2016   Procedure: ESOPHAGOGASTRODUODENOSCOPY (EGD);  Surgeon: Adrienne Silence, MD;  Location: [facility];  Service: Endoscopy;  Laterality: Left;  . GALLBLADDER SURGERY    . MASTECTOMY Right   . RADIOLOGY WITH ANESTHESIA N/A 01/25/2016   Procedure: RADIOLOGY WITH ANESTHESIA;  Surgeon: Adrienne Bras, MD;  Location: [facility];  Service: Radiology;  Laterality: N/A;  . REPLACEMENT TOTAL KNEE BILATERAL    . TONSILLECTOMY AND ADENOIDECTOMY     as a child     Current Outpatient Medications  Medication Sig Dispense Refill  . acetaminophen (TYLENOL) 500 MG tablet Take 500-1,000 mg by mouth every 12 (twelve) hours as needed (pain).     Marland Kitchen amoxicillin (AMOXIL) 500 MG capsule 500 mg. Patient takes 4 tablets by mouth  once prior to dental procedures    . amoxicillin-clavulanate (AUGMENTIN) 875-125 MG tablet Take 1 tablet by mouth 2 (two) times daily.  0  . apixaban (ELIQUIS) 5 MG TABS tablet Take 1 tablet (5 mg total) by mouth 2 (two) times daily. 180 tablet 1  . atorvastatin (LIPITOR) 10 MG tablet Take 1 tablet (10 mg total) by mouth daily. 90 tablet 1  . calcium-vitamin D 250-100 MG-UNIT tablet Take 2 tablets by mouth 2 (two) times daily.      . Cholecalciferol (VITAMIN D-3) 1000 units CAPS Take 1,000 Units by mouth daily.    Marland Kitchen diltiazem (CARDIZEM) 60 MG tablet Take 1 tablet (60 mg total) by mouth 3 (three) times daily. 270 tablet 3  . furosemide (LASIX) 20 MG tablet Take 20 mg by mouth daily as needed.    . metoprolol tartrate (LOPRESSOR) 100 MG tablet Take 1 tablet (100 mg total) by mouth 2 (two) times daily. 180 tablet 3  . Multiple Vitamins-Minerals (MULTIVITAMIN ADULT PO) Take 1 tablet by mouth daily.     Marland Kitchen omeprazole (PRILOSEC) 40 MG capsule Take 40 mg by mouth every other day.     Marland Kitchen PROAIR HFA 108 (90 Base) MCG/ACT inhaler Inhale 1-2 puffs into the lungs every 6 (six) hours as needed for wheezing or shortness of breath.     . vitamin C (ASCORBIC ACID) 500 MG tablet Take 1,000 mg by mouth daily.      No current facility-administered medications for this visit.     Allergies:   Valium [diazepam]; Amiodarone hcl [amiodarone]; Compazine [prochlorperazine]; Other; Thorazine [chlorpromazine]; Zometa [zoledronic acid]; and Ciprofloxacin    Social History:  The patient  reports that she has quit smoking. Her smoking use included cigarettes. she has never used smokeless tobacco. She reports that she does not drink alcohol or use drugs.   Family History:  The patient's family history includes Asthma in her mother; Atrial fibrillation in her sister; CVA in her father; Cancer in her daughter and maternal grandmother; Pneumonia in her brother.    ROS:  Please see the history of present illness.   All other ROS neg.   PHYSICAL EXAM: VS:  BP 128/86   Pulse 63   Ht 5\' 1"  (1.549 m)   Wt 153 lb 6.4 oz (69.6 kg)   LMP 09/12/1973 (Approximate)   BMI 28.98 kg/m  , BMI Body mass index is 28.98 kg/m. GEN: Well nourished, well developed, in no acute distress  HEENT: normal  Neck: no JVD, carotid bruits, or masses Cardiac: irreg; no murmurs, rubs, or gallops,no edema  Respiratory:  clear to auscultation bilaterally, normal work of  breathing GI: soft, nontender, nondistended, + BS MS: no deformity or atrophy  Skin: warm and dry, no rash Neuro:  Alert and Oriented x 3, Strength and sensation are intact Psych: euthymic mood, full affect    EKG:  11/23/17 - AFIB 63, NSSTW changes.  06/10/15-atrial fibrillation heart rate 64, no other abnormalities. Personally viewed-prior Prior EKG from 10/29/11 shows atrial fibrillation with heart rate 110 bpm, no other abnormalities.  ECHO: 06/18/15  - Left ventricle: The cavity size was mildly dilated. Systolic  function was normal. The estimated ejection fraction was in the  range of 55% to 60%. Wall motion was normal; there were no  regional wall motion abnormalities. - Aortic valve: Moderately calcified annulus. Trileaflet; normal  thickness, mildly calcified leaflets. Mobility was not  restricted. - Mitral valve: Calcified annulus. - Right ventricle: The cavity size was normal.  Wall thickness was  normal. Systolic function was normal. - Tricuspid valve: There was mild regurgitation. - Pulmonic valve: Transvalvular velocity was within the normal  range. There was no evidence for stenosis. - Inferior vena cava: The vessel was dilated. The respirophasic  diameter changes were in the normal range (>= 50%), consistent  with elevated central venous pressure.  ECHO 01/26/16 - Left ventricle: The cavity size was normal. Systolic function was   normal. The estimated ejection fraction was in the range of 60%   to 65%. Wall motion was normal; there were no regional wall   motion abnormalities. The study is not technically sufficient to   allow evaluation of LV diastolic function. - Aortic valve: Valve mobility was mildly restricted. Transvalvular   velocity was within the normal range. There was no stenosis.   There was trivial regurgitation. - Mitral valve: Calcified annulus. Transvalvular velocity was   within the normal range. There was no evidence for stenosis.    There was trivial regurgitation. - Left atrium: The atrium was moderately dilated. - Right ventricle: The cavity size was normal. Wall thickness was   normal. Systolic function was normal. - Atrial septum: No defect or patent foramen ovale was identified   by color flow Doppler. - Tricuspid valve: There was mild regurgitation. - Pulmonary arteries: Systolic pressure was within the normal   range. PA peak pressure: 29 mm Hg (S).  Recent Labs: 01/10/2017: ALT 7 09/20/2017: BUN 22; Creatinine, Ser 1.07; Hemoglobin 12.1; NT-Pro BNP 2,309; Platelets 325; Potassium 4.9; Sodium 139    Lipid Panel    Component Value Date/Time   CHOL 121 01/10/2017 0849   TRIG 76 01/10/2017 0849   HDL 54 01/10/2017 0849   CHOLHDL 2.2 01/10/2017 0849   CHOLHDL 3.3 01/26/2016 0300   VLDL 20 01/26/2016 0300   LDLCALC 52 01/10/2017 0849      Wt Readings from Last 3 Encounters:  11/23/17 153 lb 6.4 oz (69.6 kg)  11/17/17 151 lb (68.5 kg)  10/06/17 151 lb (68.5 kg)      Other studies Reviewed: Additional studies/ records that were reviewed today include: office notes, labs, EKG, echoew of the above records demonstrates: as above   ASSESSMENT AND PLAN:  1. Permanent atrial fibrillation -Since 2013. Failed amiodarone. -This patients CHA2DS2-VASc Score and unadjusted Ischemic Stroke Rate (% per year) is equal to 9.7 % stroke rate/year from a score of 6  Above score calculated as 1 point each if present [CHF, HTN, DM, Vascular=MI/PAD/Aortic Plaque, Age if 65-74, or Female] Above score calculated as 2 points each if present [Age > 75, or Stroke/TIA/TE]  -Continuing with chronic anti-coagulation. Eliquis now. Off coumadin. Stroke (INR 1.5).  -Under good rate control with heart rates between 60 and 90 mostly. Continue with metoprolol and diltiazem. She takes split dosing of this medication. Discussed Eliquis. -She had a stroke, left MCA when she had to be off of anticoagulation secondary to gastrointestinal  bleeding.   - Currently stable. Had some bleeding of varicose, ER, pressure, stopped. Dyspnea. Noted.   - denies any CP.    2. Coronary artery disease -Nonobstructive CAD, she believes normal cardiac catheterization in 2013 in [place]. Reviewed.   3. Pulmonary hypertension -She had been diagnosis with this previously in [place] and was getting echocardiograms every 6 months. Echocardiogram here showed normal pulmonary pressures. Reassurance. Edema - contributing.   4. Hyperlipidemia -LDL 80. Prior stroke. Deserves statin. Agree with neuro. we will start atorvastatin 10 mg once a day  and recheck lipid panel in 3 months. She has been anti-statin in the past. She showed me a article from Dr. Talmage Coin regarding co Q 10 previously. She is now taking the atorvastatin alone with no red yeast rice. LDL in the 50s. Excellent. No changes.   5.Chronic anticoagulation   - now on Eliquis   6. History of breast cancer -Prior mastectomy right-sided.  7. History of stroke -During breast cancer treatment after receiving Zometa as well as during a period of cessation of coumadin in the setting of GI bleeding. She is on chronic anticoagulation, now Eliquis.    8. Chronic lower extremity edema/venous insufficiency -Prior Lasix,  Now off with 3 gout attacks.  Encouraged thigh-high stockings. She does not like to wear these in the past. Stable chronic condition.  9. Carotid artery disease - check ultrasound. Moderate bilaterally.   Current medicines are reviewed at length with the patient today.  The patient does not have concerns regarding medicines.  The following changes have been made:  no change  Labs/ tests ordered today include:   Orders Placed This Encounter  Procedures  . EKG 12-Lead     Disposition:   FU with Tasmin Exantus in 6 months  Signed, Adrienne Furbish, MD  11/23/2017 5:32 PM    [facility] [place], [place], [place]  [address] Phone: [phone]; Fax:  [phone]

## 2017-11-23 NOTE — Patient Instructions (Addendum)
Medication Instructions:  The current medical regimen is effective;  continue present plan and medications.  Testing/Procedures: Your physician has requested that you have a carotid duplex. This test is an ultrasound of the carotid arteries in your neck. It looks at blood flow through these arteries that supply the brain with blood. Allow one hour for this exam. There are no restrictions or special instructions.  Follow-Up: Follow up in 6 months with Dr. Marlou Porch.  You will receive a letter in the mail 2 months before you are due.  Please call us when you receive this letter to schedule your follow up appointment.  If you need a refill on your cardiac medications before your next appointment, please call your pharmacy.  Thank you for choosing [facility]!!

## 2017-12-04 ENCOUNTER — Ambulatory Visit (HOSPITAL_COMMUNITY)
Admission: RE | Admit: 2017-12-04 | Discharge: 2017-12-04 | Disposition: A | Discharge status: Home / Self Care | Payer: Medicare Other | Source: Ambulatory Visit | Attending: Cardiovascular Disease | Admitting: Cardiovascular Disease

## 2017-12-04 ENCOUNTER — Other Ambulatory Visit: Payer: Self-pay | Admitting: Cardiology

## 2017-12-04 DIAGNOSIS — Z8673 Personal history of transient ischemic attack (TIA), and cerebral infarction without residual deficits: Secondary | ICD-10-CM | POA: Insufficient documentation

## 2017-12-04 DIAGNOSIS — I2510 Atherosclerotic heart disease of native coronary artery without angina pectoris: Secondary | ICD-10-CM | POA: Insufficient documentation

## 2017-12-04 DIAGNOSIS — E785 Hyperlipidemia, unspecified: Secondary | ICD-10-CM | POA: Diagnosis not present

## 2017-12-04 DIAGNOSIS — Z87891 Personal history of nicotine dependence: Secondary | ICD-10-CM | POA: Diagnosis not present

## 2017-12-04 DIAGNOSIS — I6523 Occlusion and stenosis of bilateral carotid arteries: Secondary | ICD-10-CM

## 2017-12-04 DIAGNOSIS — I10 Essential (primary) hypertension: Secondary | ICD-10-CM | POA: Insufficient documentation

## 2017-12-12 DIAGNOSIS — N3001 Acute cystitis with hematuria: Secondary | ICD-10-CM | POA: Diagnosis not present

## 2017-12-12 DIAGNOSIS — N3000 Acute cystitis without hematuria: Secondary | ICD-10-CM | POA: Diagnosis not present

## 2017-12-19 DIAGNOSIS — N390 Urinary tract infection, site not specified: Secondary | ICD-10-CM | POA: Diagnosis not present

## 2017-12-22 ENCOUNTER — Ambulatory Visit: Payer: Medicare Other | Admitting: Obstetrics and Gynecology

## 2017-12-25 ENCOUNTER — Ambulatory Visit (INDEPENDENT_AMBULATORY_CARE_PROVIDER_SITE_OTHER): Payer: Medicare Other | Admitting: Obstetrics and Gynecology

## 2017-12-25 ENCOUNTER — Other Ambulatory Visit: Payer: Self-pay

## 2017-12-25 ENCOUNTER — Encounter: Payer: Self-pay | Admitting: Obstetrics and Gynecology

## 2017-12-25 VITALS — BP 120/74 | HR 60 | Resp 14 | Ht 60.25 in | Wt 155.0 lb

## 2017-12-25 DIAGNOSIS — Z01419 Encounter for gynecological examination (general) (routine) without abnormal findings: Secondary | ICD-10-CM | POA: Diagnosis not present

## 2017-12-25 DIAGNOSIS — N813 Complete uterovaginal prolapse: Secondary | ICD-10-CM

## 2017-12-25 DIAGNOSIS — T8369XA Infection and inflammatory reaction due to other prosthetic device, implant and graft in genital tract, initial encounter: Secondary | ICD-10-CM

## 2017-12-25 DIAGNOSIS — Z124 Encounter for screening for malignant neoplasm of cervix: Secondary | ICD-10-CM

## 2017-12-25 DIAGNOSIS — N760 Acute vaginitis: Secondary | ICD-10-CM

## 2017-12-25 NOTE — Progress Notes (Addendum)
84 y.o. G4P3003 Married Caucasian female here for annual exam.    Has uterovaginal prolapse and worked with Dr. Sabra Heck to try a new pessaries.   Currently is using a size 7 pessary ring with support for about one month.  Had a foley catheter for about 3 months due to urinary retention and under the care of Dr. Matilde Sprang.  Has been having UTIs as well.  Recent UTIs: 09/18/17 - E Coli tx with Bactrim.  2/519 - started Cefdinir instead of TMP/SMX.  Dr. Matilde Sprang changed this.  March 2019 - Tx with Nitrofurantoin.  PCP at [facility].  Had thrush and tx with Nystatin.  December 19, 2017 - Tx with Rocephin for continued infection.   PCP at [facility].   No pain today. Feels like the pessary is holding the uterus and bladder well.  With constipation, she has to support the pessary.  Has some occasional spotting, last noted this about 3 weeks ago.  Voiding well.  Wears a pad all the time due to overactive bladder.   May want to start having her oncology care here in [place].  Currently traveling to [place].    PCP: Dr. Kathyrn Lass    Patient's last menstrual period was 09/12/1973 (approximate).           Sexually active: Yes.    The current method of family planning is post menopausal status.    Exercising: No.  The patient does not participate in regular exercise at present. Smoker:  Former  Health Maintenance: Pap:  11-25-15 Neg History of abnormal Pap:  no MMG:  11/15/17 -- done in [place], [place].  States she had a breast MRI as well which was normal. Colonoscopy:  Years ago per patient in [place] BMD:   About 3 years ago  Result  Osteopenia -- unsure of where this was done TDaP:  2016 Gardasil:   n/a HIV: never Hep C: n/a Screening Labs:  PCP   reports that she has quit smoking. Her smoking use included cigarettes. She has never used smokeless tobacco. She reports that she does not drink alcohol or use drugs.  Past Medical History:  Diagnosis Date  . Afib ([place])   . Arthritis of ankle   . CAD  (coronary artery disease)   . Cancer [facility]) 2012   breast cancer-right  . Compression fracture of T12 vertebra (HCC)   . Diverticulosis   . Edema   . Elevated uric acid in blood   . GI bleed 12/2015  . Hyperlipidemia   . Hypertension   . Long-term (current) use of anticoagulants   . Moderate mitral regurgitation   . Pulmonary HTN ([place])    Echo 1/19: EF 60-65, no RWMA, Ao sclerosis, trivial AI, mild MR, mod BAE, mod TR, PASP 49  . Stroke ([place]) 01/25/2016  . TIA (transient ischemic attack) 09/2011  . Urinary incontinence     Past Surgical History:  Procedure Laterality Date  . APPENDECTOMY    . BREAST SURGERY  2012   Rt.mastectomy--[place], [place]  2013  . CATARACT EXTRACTION, BILATERAL    . DILATION AND CURETTAGE OF UTERUS     in her early 44's for DUB  . ESOPHAGOGASTRODUODENOSCOPY Left 01/08/2016   Procedure: ESOPHAGOGASTRODUODENOSCOPY (EGD);  Surgeon: Arta Silence, MD;  Location: [facility];  Service: Endoscopy;  Laterality: Left;  . GALLBLADDER SURGERY    . MASTECTOMY Right   . RADIOLOGY WITH ANESTHESIA N/A 01/25/2016   Procedure: RADIOLOGY WITH ANESTHESIA;  Surgeon: Luanne Bras,  MD;  Location: [facility];  Service: Radiology;  Laterality: N/A;  . REPLACEMENT TOTAL KNEE BILATERAL    . TONSILLECTOMY AND ADENOIDECTOMY     as a child    Current Outpatient Medications  Medication Sig Dispense Refill  . acetaminophen (TYLENOL) 500 MG tablet Take 500-1,000 mg by mouth every 12 (twelve) hours as needed (pain).     Marland Kitchen amoxicillin (AMOXIL) 500 MG capsule 500 mg. Patient takes 4 tablets by mouth once prior to dental procedures    . apixaban (ELIQUIS) 5 MG TABS tablet Take 1 tablet (5 mg total) by mouth 2 (two) times daily. 180 tablet 1  . atorvastatin (LIPITOR) 10 MG tablet Take 1 tablet (10 mg total) by mouth daily. 90 tablet 1  . calcium-vitamin D 250-100 MG-UNIT tablet Take 2 tablets by mouth 2 (two) times daily.     . Cholecalciferol (VITAMIN D-3)  1000 units CAPS Take 1,000 Units by mouth daily.    Marland Kitchen diltiazem (CARDIZEM) 60 MG tablet Take 1 tablet (60 mg total) by mouth 3 (three) times daily. 270 tablet 3  . furosemide (LASIX) 20 MG tablet Take 20 mg by mouth daily as needed.    . metoprolol tartrate (LOPRESSOR) 100 MG tablet Take 1 tablet (100 mg total) by mouth 2 (two) times daily. 180 tablet 3  . Multiple Vitamins-Minerals (MULTIVITAMIN ADULT PO) Take 1 tablet by mouth daily.     Marland Kitchen omeprazole (PRILOSEC) 40 MG capsule Take 40 mg by mouth every other day.     Marland Kitchen PROAIR HFA 108 (90 Base) MCG/ACT inhaler Inhale 1-2 puffs into the lungs every 6 (six) hours as needed for wheezing or shortness of breath.     . vitamin C (ASCORBIC ACID) 500 MG tablet Take 1,000 mg by mouth daily.      No current facility-administered medications for this visit.     Family History  Problem Relation Age of Onset  . Asthma Mother   . CVA Father   . Atrial fibrillation Sister        HAS PACER  . Pneumonia Brother   . Cancer Daughter        COLON CANCER  . Cancer Maternal Grandmother        breast cancer    ROS:  Pertinent items are noted in HPI.  Otherwise, a comprehensive ROS was negative.  Exam:   BP 120/74 (BP Location: Left Arm, Patient Position: Sitting, Cuff Size: Large)   Pulse 60   Resp 14   Ht 5' 0.25" (1.53 m)   Wt 155 lb (70.3 kg)   LMP 09/12/1973 (Approximate)   BMI 30.02 kg/m     General appearance: alert, cooperative and appears stated age Head: Normocephalic, without obvious abnormality, atraumatic Neck: no adenopathy, supple, symmetrical, trachea midline and thyroid normal to inspection and palpation Lungs: clear to auscultation bilaterally Breasts:right breast absent.  Left breast -  normal appearance, no masses or tenderness, No nipple retraction or dimpling, No nipple discharge or bleeding, No axillary or supraclavicular adenopathy Heart: regular rate and rhythm Abdomen: soft, non-tender; no masses, no  organomegaly Extremities: extremities normal, atraumatic, no cyanosis.  1+ bilateral LE edema.  Skin: Skin color, texture, turgor normal. No rashes or lesions Lymph nodes: Cervical, supraclavicular, and axillary nodes normal. No abnormal inguinal nodes palpated Neurologic: Grossly normal  Pelvic: External genitalia:  no lesions              Urethra:  normal appearing urethra with no masses, tenderness or lesions  Bartholins and Skenes: normal                 Vagina: normal appearing vagina with normal color and discharge, left vaginal apical sidewall with 5 mm wide and 2 cm in length ulceration.               Cervix: no lesions              Pap taken: No. Bimanual Exam:  Uterus:  normal size, contour, position, consistency, mobility, non-tender              Adnexa: no mass, fullness, tenderness              Rectal exam: Yes.  .  Confirms.              Anus:  normal sphincter tone, no lesions  Pessary removed, cleansed, and replaced.   Chaperone was present for exam.  Assessment:   Well woman visit with normal exam. Personal hx right breast cancer.  Status post right mastectomy. FH breast and colon cancer.  Hx atrial fibrillation.  On Elaquis.  Complete uterovaginal prolapse.  Using #7 ring pessary with support.  Left vaginal sidewall ulceration.  Osteopenia. Hx vertebral compression fracture.   Plan: Mammogram yearly.  Will get a copy of her mammogram and breast MRI. Recommended self breast awareness. Pap and HR HPV not indicated. Guidelines for Calcium, Vitamin D, regular exercise program including cardiovascular and weight bearing exercise. Continue current pessary use.  Not a candidate for vaginal estrogen.  We did discuss colpocleisis as an alternative to pessary use, but this would require patient to come off the Elaquis.   FU monthly.  I gave her the name of Dr. Jana Hakim for future potential oncology care.  Follow up annually and prn.   After visit  summary provided.   An additional 15 minutes was spent regarding prolapse, pessary use, potential surgery, and recurrent UTIs.  Over 50% was spent in counseling.

## 2017-12-25 NOTE — Patient Instructions (Addendum)
Dr. Gunnar Bulla Magrinat would be a good oncologist for you here in [place].  EXERCISE AND DIET:  We recommended that you start or continue a regular exercise program for good health. Regular exercise means any activity that makes your heart beat faster and makes you sweat.  We recommend exercising at least 30 minutes per day at least 3 days a week, preferably 4 or 5.  We also recommend a diet low in fat and sugar.  Inactivity, poor dietary choices and obesity can cause diabetes, heart attack, stroke, and kidney damage, among others.    ALCOHOL AND SMOKING:  Women should limit their alcohol intake to no more than 7 drinks/beers/glasses of wine (combined, not each!) per week. Moderation of alcohol intake to this level decreases your risk of breast cancer and liver damage. And of course, no recreational drugs are part of a healthy lifestyle.  And absolutely no smoking or even second hand smoke. Most people know smoking can cause heart and lung diseases, but did you know it also contributes to weakening of your bones? Aging of your skin?  Yellowing of your teeth and nails?  CALCIUM AND VITAMIN D:  Adequate intake of calcium and Vitamin D are recommended.  The recommendations for exact amounts of these supplements seem to change often, but generally speaking 600 mg of calcium (either carbonate or citrate) and 800 units of Vitamin D per day seems prudent. Certain women may benefit from higher intake of Vitamin D.  If you are among these women, your doctor will have told you during your visit.    PAP SMEARS:  Pap smears, to check for cervical cancer or precancers,  have traditionally been done yearly, although recent scientific advances have shown that most women can have pap smears less often.  However, every woman still should have a physical exam from her gynecologist every year. It will include a breast check, inspection of the vulva and vagina to check for abnormal growths or skin changes, a visual exam of the  cervix, and then an exam to evaluate the size and shape of the uterus and ovaries.  And after 82 years of age, a rectal exam is indicated to check for rectal cancers. We will also provide age appropriate advice regarding health maintenance, like when you should have certain vaccines, screening for sexually transmitted diseases, bone density testing, colonoscopy, mammograms, etc.   MAMMOGRAMS:  All women over 39 years old should have a yearly mammogram. Many facilities now offer a "3D" mammogram, which may cost around $50 extra out of pocket. If possible,  we recommend you accept the option to have the 3D mammogram performed.  It both reduces the number of women who will be called back for extra views which then turn out to be normal, and it is better than the routine mammogram at detecting truly abnormal areas.    COLONOSCOPY:  Colonoscopy to screen for colon cancer is recommended for all women at age 16.  We know, you hate the idea of the prep.  We agree, BUT, having colon cancer and not knowing it is worse!!  Colon cancer so often starts as a polyp that can be seen and removed at colonscopy, which can quite literally save your life!  And if your first colonoscopy is normal and you have no family history of colon cancer, most women don't have to have it again for 10 years.  Once every ten years, you can do something that may end up saving your life, right?  We will be happy to help you get it scheduled when you are ready.  Be sure to check your insurance coverage so you understand how much it will cost.  It may be covered as a preventative service at no cost, but you should check your particular policy.

## 2017-12-28 DIAGNOSIS — Z961 Presence of intraocular lens: Secondary | ICD-10-CM | POA: Diagnosis not present

## 2017-12-28 DIAGNOSIS — H524 Presbyopia: Secondary | ICD-10-CM | POA: Diagnosis not present

## 2018-01-24 ENCOUNTER — Encounter: Payer: Self-pay | Admitting: Obstetrics and Gynecology

## 2018-01-24 ENCOUNTER — Other Ambulatory Visit: Payer: Self-pay

## 2018-01-24 ENCOUNTER — Ambulatory Visit (INDEPENDENT_AMBULATORY_CARE_PROVIDER_SITE_OTHER): Payer: Medicare Other | Admitting: Obstetrics and Gynecology

## 2018-01-24 VITALS — BP 116/60 | HR 72 | Resp 16 | Ht 60.25 in | Wt 154.0 lb

## 2018-01-24 DIAGNOSIS — N762 Acute vulvitis: Secondary | ICD-10-CM | POA: Diagnosis not present

## 2018-01-24 DIAGNOSIS — N765 Ulceration of vagina: Secondary | ICD-10-CM | POA: Diagnosis not present

## 2018-01-24 DIAGNOSIS — Z4689 Encounter for fitting and adjustment of other specified devices: Secondary | ICD-10-CM | POA: Diagnosis not present

## 2018-01-24 DIAGNOSIS — N813 Complete uterovaginal prolapse: Secondary | ICD-10-CM | POA: Diagnosis not present

## 2018-01-24 DIAGNOSIS — I6523 Occlusion and stenosis of bilateral carotid arteries: Secondary | ICD-10-CM

## 2018-01-24 MED ORDER — TRIAMCINOLONE ACETONIDE 0.025 % EX OINT: 1.0000 "application " | TOPICAL_OINTMENT | Freq: Two times a day (BID) | CUTANEOUS | 0 refills | Status: DC

## 2018-01-24 NOTE — Progress Notes (Signed)
GYNECOLOGY  VISIT   HPI: 84 y.o.   Married  Caucasian  female   [phone] with Patient's last menstrual period was 09/12/1973 (approximate).   here for 1 month pessary recheck. Has complete uterovaginal prolapse.   Has had changes in the size of her pessary and had a foley for a prolonged period of time due to urinary retention.  Voiding well overall. Sometimes she needs to wait to void.  No dysuria or odor to urine.  No blood in the urine.   Does see some pink staining on her pad.   Hx UTIs this year.     Having itching on the vulva. Not sure if she is having discharge. Treated twice by her PCP and her dermatologist.  Does not have the list of these medication.  Using OTC Lotrimin.  Wears a pad constantly due to overactive bladder.   On Elaquis.   Going to [facility]/01/18 for a family wedding.  Does not want to make a change in her care prior to the wedding due to concern about ability to empty her bladder.  GYNECOLOGIC HISTORY: Patient's last menstrual period was 09/12/1973 (approximate). Contraception:  Postmenopausal Menopausal hormone therapy:  none Last mammogram:  11/15/17 -- done in [place], [place].  States she had a breast MRI as well which was normal Last pap smear:   11-25-15 Neg        OB History    Gravida  3   Para  3   Term  3   Preterm      AB      Living  3     SAB      TAB      Ectopic      Multiple      Live Births                 Patient Active Problem List   Diagnosis Date Noted  . Diplopia 01/29/2016  . GERD (gastroesophageal reflux disease) 01/29/2016  . Cerebrovascular accident (CVA) due to embolism of left middle cerebral artery ([place]) 01/29/2016  . Persistent atrial fibrillation ([place])   . Bleeding gastrointestinal   . Gait disturbance, post-stroke 01/27/2016  . Fall   . Secondary hypertension, unspecified   . Cerebral infarction due to thrombosis of left middle cerebral artery (HCC) s/p mechanical thrombectomy   .  Malnutrition of moderate degree 01/09/2016  . GI bleed 01/06/2016  . Chronic atrial fibrillation ([place]) 06/10/2015  . Pulmonary hypertension, secondary ([place]) 06/10/2015  . Chronic anticoagulation 06/10/2015  . Hyperlipidemia 06/10/2015  . Essential hypertension 06/10/2015  . History of stroke 06/10/2015  . Coronary artery disease due to lipid rich plaque 06/10/2015  . Afib ([place])   . Elevated uric acid in blood   . Arthritis of ankle   . Pulmonary HTN ([place])     Past Medical History:  Diagnosis Date  . Afib ([place])   . Arthritis of ankle   . CAD (coronary artery disease)   . Cancer [facility]) 2012   breast cancer-right  . Compression fracture of T12 vertebra (HCC)   . Diverticulosis   . Edema   . Elevated uric acid in blood   . GI bleed 12/2015  . Hyperlipidemia   . Hypertension   . Long-term (current) use of anticoagulants   . Moderate mitral regurgitation   . Pulmonary HTN ([place])    Echo 1/19: EF 60-65, no RWMA, Ao sclerosis, trivial AI, mild MR, mod BAE, mod TR, PASP 49  .  Stroke ([place]) 01/25/2016  . TIA (transient ischemic attack) 09/2011  . Urinary incontinence     Past Surgical History:  Procedure Laterality Date  . APPENDECTOMY    . BREAST SURGERY  2012   Rt.mastectomy--[place], [place]  2013  . CATARACT EXTRACTION, BILATERAL    . DILATION AND CURETTAGE OF UTERUS     in her early 31's for DUB  . ESOPHAGOGASTRODUODENOSCOPY Left 01/08/2016   Procedure: ESOPHAGOGASTRODUODENOSCOPY (EGD);  Surgeon: Arta Silence, MD;  Location: [facility];  Service: Endoscopy;  Laterality: Left;  . GALLBLADDER SURGERY    . MASTECTOMY Right   . RADIOLOGY WITH ANESTHESIA N/A 01/25/2016   Procedure: RADIOLOGY WITH ANESTHESIA;  Surgeon: Luanne Bras, MD;  Location: [facility];  Service: Radiology;  Laterality: N/A;  . REPLACEMENT TOTAL KNEE BILATERAL    . TONSILLECTOMY AND ADENOIDECTOMY     as a child    Current Outpatient Medications  Medication Sig Dispense  Refill  . acetaminophen (TYLENOL) 500 MG tablet Take 500-1,000 mg by mouth every 12 (twelve) hours as needed (pain).     Marland Kitchen amoxicillin (AMOXIL) 500 MG capsule 500 mg. Patient takes 4 tablets by mouth once prior to dental procedures    . apixaban (ELIQUIS) 5 MG TABS tablet Take 1 tablet (5 mg total) by mouth 2 (two) times daily. 180 tablet 1  . atorvastatin (LIPITOR) 10 MG tablet Take 1 tablet (10 mg total) by mouth daily. 90 tablet 1  . calcium-vitamin D 250-100 MG-UNIT tablet Take 2 tablets by mouth 2 (two) times daily.     . Cholecalciferol (VITAMIN D-3) 1000 units CAPS Take 1,000 Units by mouth daily.    Marland Kitchen diltiazem (CARDIZEM) 60 MG tablet Take 1 tablet (60 mg total) by mouth 3 (three) times daily. 270 tablet 3  . furosemide (LASIX) 20 MG tablet Take 20 mg by mouth daily as needed.    . metoprolol tartrate (LOPRESSOR) 100 MG tablet Take 1 tablet (100 mg total) by mouth 2 (two) times daily. 180 tablet 3  . Multiple Vitamins-Minerals (MULTIVITAMIN ADULT PO) Take 1 tablet by mouth daily.     Marland Kitchen omeprazole (PRILOSEC) 40 MG capsule Take 40 mg by mouth every other day.     Marland Kitchen PROAIR HFA 108 (90 Base) MCG/ACT inhaler Inhale 1-2 puffs into the lungs every 6 (six) hours as needed for wheezing or shortness of breath.     . vitamin C (ASCORBIC ACID) 500 MG tablet Take 1,000 mg by mouth daily.     Marland Kitchen triamcinolone (KENALOG) 0.025 % ointment Apply 1 application topically 2 (two) times daily. Use for 1 - 2 weeks as needed. 30 g 0   No current facility-administered medications for this visit.      ALLERGIES: Valium [diazepam]; Amiodarone hcl [amiodarone]; Compazine [prochlorperazine]; Other; Thorazine [chlorpromazine]; Zometa [zoledronic acid]; and Ciprofloxacin  Family History  Problem Relation Age of Onset  . Asthma Mother   . CVA Father   . Atrial fibrillation Sister        HAS PACER  . Pneumonia Brother   . Cancer Daughter        COLON CANCER  . Cancer Maternal Grandmother        breast cancer     Social History   Socioeconomic History  . Marital status: Married    Spouse name: Not on file  . Number of children: Not on file  . Years of education: Not on file  . Highest education level: Not on file  Occupational History  . Not on file  Social Needs  . Financial resource strain: Not on file  . Food insecurity:    Worry: Not on file    Inability: Not on file  . Transportation needs:    Medical: Not on file    Non-medical: Not on file  Tobacco Use  . Smoking status: Former Smoker    Types: Cigarettes  . Smokeless tobacco: Never Used  Substance and Sexual Activity  . Alcohol use: No    Alcohol/week: 0.0 oz  . Drug use: No  . Sexual activity: Yes    Partners: Male    Birth control/protection: Post-menopausal  Lifestyle  . Physical activity:    Days per week: Not on file    Minutes per session: Not on file  . Stress: Not on file  Relationships  . Social connections:    Talks on phone: Not on file    Gets together: Not on file    Attends religious service: Not on file    Active member of club or organization: Not on file    Attends meetings of clubs or organizations: Not on file    Relationship status: Not on file  . Intimate partner violence:    Fear of current or ex partner: Not on file    Emotionally abused: Not on file    Physically abused: Not on file    Forced sexual activity: Not on file  Other Topics Concern  . Not on file  Social History Narrative  . Not on file    Review of Systems  Constitutional: Negative.   HENT: Negative.   Eyes: Negative.   Respiratory: Negative.   Cardiovascular: Positive for leg swelling.  Gastrointestinal: Negative.   Endocrine: Negative.   Genitourinary: Positive for urgency.       Unscheduled spotting Vaginal itching Loss of urine spontaneously Loss of urine with sneeze or cough Night urination  Musculoskeletal: Negative.   Skin: Negative.   Allergic/Immunologic: Negative.   Neurological: Negative.    Hematological: Negative.   Psychiatric/Behavioral: Negative.     PHYSICAL EXAMINATION:    BP 116/60 (BP Location: Left Arm, Patient Position: Sitting, Cuff Size: Large)   Pulse 72   Resp 16   Ht 5' 0.25" (1.53 m)   Wt 154 lb (69.9 kg)   LMP 09/12/1973 (Approximate)   BMI 29.83 kg/m     General appearance: alert, cooperative and appears stated age   Pelvic: External genitalia:  no lesions              Urethra:  normal appearing urethra with no masses, tenderness or lesions              Bartholins and Skenes: normal                 Vagina:  Ulceration of left vaginal sidewall.  5 mm x 15 mm.  3 - 4 mm deep.               Cervix: no lesions                Bimanual Exam:  Uterus:  normal size, contour, position, consistency, mobility, non-tender              Adnexa: no mass, fullness, tenderness        Pessary removed, cleanse, and replaced.  Pessary stained with blood.   Chaperone was present for exam.  ASSESSMENT  Complete uterovaginal prolapse.  Vaginal ulceration.  Seems to  be increasing.  Urinary retention with smaller pessary use.  Vulvitis.  May be from pad use.  Hx breast cancer.  On Elaquis.  Hx stroke when anticoagulant was discontinued in the past.   PLAN  Continue use of this pessary.  Return to the office after wedding, May 29.   She will bring her smaller pessary then and we will see if this one is more tolerated by her tissues.  I did briefly mention the colpocleisis, but we are concerned about her risk of coming off anticoagulants. She may try to remove the pessary which can also help to relieve pressure on the vaginal walls. Triamcinolone ointment 0.025% to area bid x 1 - 2 weeks prn.    An After Visit Summary was printed and given to the patient.  __15____ minutes face to face time of which over 50% was spent in counseling.

## 2018-01-25 ENCOUNTER — Telehealth: Payer: Self-pay | Admitting: Obstetrics and Gynecology

## 2018-01-25 NOTE — Telephone Encounter (Signed)
Spoke with patient, advised as seen below per Dr. Silva.  Patient verbalizes understanding and is agreeable.  Encounter closed.  

## 2018-01-25 NOTE — Telephone Encounter (Signed)
Patient stated that she has an abrasion and would like to know if she can do a sitz bath.

## 2018-01-25 NOTE — Telephone Encounter (Signed)
Spoke with patient. Showers, no bath. Asking if she can use sitz bath for making sure vaginal abrasion is cleaned and help heal?   Reviewed sitz bath, advised unsure if water from sitz bath would reach area of concern, can use if desired. Reviewed options of using small squirt bottle or cup of warm water after voiding. Advised Dr. Quincy Simmonds will review, I will return call if any additional recommendations. Patient verbalizes understanding.   Routing to provider for final review. Patient is agreeable to disposition. Will close encounter.

## 2018-01-25 NOTE — Telephone Encounter (Signed)
A sitz bath will not reach the area of ulceration at her vaginal apex.  Taking the pessary out at nights may give some relief of pressure on the area.  She could simply replace the pessary in the mornings.

## 2018-02-07 ENCOUNTER — Ambulatory Visit: Payer: Medicare Other | Admitting: Obstetrics and Gynecology

## 2018-02-07 ENCOUNTER — Encounter: Payer: Self-pay | Admitting: Neurology

## 2018-02-07 ENCOUNTER — Ambulatory Visit (INDEPENDENT_AMBULATORY_CARE_PROVIDER_SITE_OTHER): Payer: Medicare Other | Admitting: Neurology

## 2018-02-07 VITALS — BP 125/69 | HR 56 | Ht 60.25 in | Wt 156.6 lb

## 2018-02-07 DIAGNOSIS — I481 Persistent atrial fibrillation: Secondary | ICD-10-CM

## 2018-02-07 DIAGNOSIS — E785 Hyperlipidemia, unspecified: Secondary | ICD-10-CM | POA: Diagnosis not present

## 2018-02-07 DIAGNOSIS — Z7901 Long term (current) use of anticoagulants: Secondary | ICD-10-CM | POA: Diagnosis not present

## 2018-02-07 DIAGNOSIS — I63312 Cerebral infarction due to thrombosis of left middle cerebral artery: Secondary | ICD-10-CM | POA: Diagnosis not present

## 2018-02-07 DIAGNOSIS — I10 Essential (primary) hypertension: Secondary | ICD-10-CM

## 2018-02-07 DIAGNOSIS — I6523 Occlusion and stenosis of bilateral carotid arteries: Secondary | ICD-10-CM

## 2018-02-07 DIAGNOSIS — I4819 Other persistent atrial fibrillation: Secondary | ICD-10-CM

## 2018-02-07 NOTE — Patient Instructions (Addendum)
-   continue eliquis 5mg  bid and lipitor for stroke prevention. - check BP at home and record.  - follow up with cardiology Dr. Marlou Porch - Follow up with your primary care physician for stroke risk factor modification. Recommend maintain blood pressure goal <130/80, diabetes with hemoglobin A1c goal below 7.0% and lipids with LDL cholesterol goal below 70 mg/dL.  - healthy diet and regular exercise and avoid fall - follow up as needed.

## 2018-02-07 NOTE — Progress Notes (Signed)
STROKE NEUROLOGY FOLLOW UP NOTE  NAME: Adrienne [name] DOB: Mar 02, 1928  REASON FOR VISIT: stroke follow up HISTORY FROM: pt and husband and chart  Today we had the pleasure of seeing Adrienne [name] in follow-up at our Neurology Clinic. Pt was accompanied by husband.   History Summary Adrienne [name] is a 84 y.o. female with history of atrial fibrillation on coumadin with GIB 3 weeks PTA was admitted on 01/25/16 for R sided weakness and receptive phasia following a fall. Adrienne [name] did not receive IV t-PA per documenation due to hx GIB. INR 1.5 on arrival. CTA showed L MCA bifurcation occlusion, left ICA siphon moderate plaque, and intracranial stenosis b/l ACA, R MCA, L PCA branches. CTP showed penumbra. Adrienne [name] was taken to IR with complete TICI 3 revascularization of L MCA with Solitaire and IA integrelin. MRI showed only a small volume of the left MCA territory infarct. EF 60-65% LDL 80 and A1C 5.6. Her coumadin switched to eliquis. H&H stable no GIB during admission. Adrienne [name] was discharged to CIR with eliquis and lipitor 20.   04/06/16 follow up - the patient has been doing well. Adrienne [name] had full recovery and d/c from CIR to home. Adrienne [name] followed with cardiology and lipitor stopped due to mild side effects of myalgia. On cardizem and metoprolol of rate control. On eliquis without bleeding side effects. BP 130/80.     08/11/16 follow up - the pt has been doing well. No recurrent stroke like symptoms. BP at home well controlled, 125-145/50-65. Finished PT/OT. Has follow up with PCP next week and cardiology Dr Marlou Porch in 09/2015. Not on statin but red yeast. BP 120/60  02/08/17 follow up - pt has been doing well, no stroke like symptoms. Adrienne [name] had one episode of small varicose vein bleeding after trauma, went to ED, had compression and bleeding stopped. Did not require stitches. Other than that, Adrienne [name] has been no complains. Adrienne [name] followed with Dr. Marlou Porch, and put back on lipitor 10 and lipid panel repeat was excellent. BP  today 135/75.   Interval History During the interval time, pt has been doing well. No stroke recurrence. On eliquis, no bleeding or GIB. Tolerating low dose lipitor well. Followed with Dr. Marlou Porch at cardiology and no change of medications. BP today 125/69. No other complains.    REVIEW OF SYSTEMS: Full 14 system review of systems performed and notable only for those listed below and in HPI above, all others are negative:  Constitutional:   Cardiovascular: leg swelling Ear/Nose/Throat:  Hearing loss Skin:  Eyes:   Respiratory:  SOB Gastroitestinal:   Genitourinary: incontinence of bladder, blood in urine, urgency, urine disease Hematology/Lymphatic:  Bruise easily Endocrine:  Musculoskeletal:   Allergy/Immunology:   Neurological:   Psychiatric:  Sleep:   The following represents the patient's updated allergies and side effects list: Allergies  Allergen Reactions  . Valium [Diazepam] Other (See Comments)    HYPERACTIVITY   . Amiodarone Hcl [Amiodarone] Other (See Comments)    AFIB  . Compazine [Prochlorperazine] Other (See Comments)    HYPERACTIVITY   . Other Other (See Comments)    "Kidney dye"--patient states this gave her severe shakes/chills  . Thorazine [Chlorpromazine] Other (See Comments)    HYPERACTIVITY   . Zometa [Zoledronic Acid] Other (See Comments)    PROBLEMS WITH EYES  . Ciprofloxacin Swelling    "swelling" in Rt.elbow    The neurologically relevant items on the patient's problem list were reviewed on today's visit.  Neurologic Examination  A problem focused  neurological exam (12 or more points of the single system neurologic examination, vital signs counts as 1 point, cranial nerves count for 8 points) was performed.  Blood pressure 125/69, pulse (!) 56, height 5' 0.25" (1.53 m), weight 156 lb 9.6 oz (71 kg), last menstrual period 09/12/1973.  General - Well nourished, well developed, in no apparent distress.  Ophthalmologic - Sharp disc margins  OU.  Cardiovascular - irregularly irregular heart rate and rhythm.  Mental Status -  Level of arousal and orientation to time, place, and person were intact. Language including expression, naming, repetition, comprehension was assessed and found intact. Fund of Knowledge was assessed and was intact.  Cranial Nerves II - XII - II - Visual field intact OU. III, IV, VI - Extraocular movements intact. V - Facial sensation intact bilaterally. VII - Facial movement intact bilaterally. VIII - Hearing & vestibular intact bilaterally. X - Palate elevates symmetrically. XI - Chin turning & shoulder shrug intact bilaterally. XII - Tongue protrusion intact.  Motor Strength - The patient's strength was normal in all extremities and pronator drift was absent.  Bulk was normal and fasciculations were absent.   Motor Tone - Muscle tone was assessed at the neck and appendages and was normal.  Reflexes - The patient's reflexes were 1+ in all extremities and Adrienne [name] had no pathological reflexes.  Sensory - Light touch, temperature/pinprick were assessed and were normal.    Coordination - The patient had normal movements in the hands and feet with no ataxia or dysmetria.  Tremor was absent.  Gait and Station - The patient's transfers, posture, gait, station, and turns were observed as normal.   Data reviewed: I personally reviewed the images and agree with the radiology interpretations.  Ct Head Wo Contrast 01/25/2016 Early changes of acute posterior left MCA infarct. No parenchymal hematoma. 01/25/2016 Atrophy with small vessel disease in the periventricular white matter. Prior infarcts as noted above. No acute appearing infarct is evident. No hemorrhage or mass effect. ADDENDUM Left hemisphere ASPECTS score = 9; small area of cortical hypodensity in the left M2 as seen on series 21, image 14.had given verbal preliminary ASPECTS score of "10"   Ct Angio Head & Neck W/cm &/or Wo/cm 01/25/2016 1.  Positive for Emergent Large Vessel Occlusion at The left MCA bifurcation. Abnormal left MCA perfusion with noncontrast head CT and CT Brain Perfusion parameters favorable for clot retrieval. 2. Superimposed atherosclerotic stenosis at the left ICA origin (60-65%) and left supraclinoid ICA siphon (moderate) related to calcified plaque. 3. Superimposed mild to moderate second and third order branch intracranial atherosclerosis including in the bilateral ACA, right MCA, and left PCA branches. 4. Right carotid bifurcation calcified plaque without stenosis.   Ct C-spine No Charge 01/25/2016 Multilevel degenerative change without acute abnormality.   Ct Cerebral Perfusion W/cm 01/25/2016 1. Positive for Emergent Large Vessel Occlusion at The left MCA bifurcation. Abnormal left MCA perfusion with noncontrast head CT and CT Brain Perfusion parameters favorable for clot retrieval.   Cerebral angiogram 01/25/2016 bilateral common carotid arteriograms followed by complete revascularization of Occluded Lt MCA With x 1 pass with Solitaire 4 mmx 40 mm Retrieval device and 4 mg of superselective IA integrelin with TICI 3 revascularization.  MRI Brain Wo Contrast 01/27/2016 Only a small volume of left MCA territory infarction occurred, primarily along the left sylvian fissure. No hemorrhage or mass effect. 2. Occasional other punctate areas of restricted diffusion over both superior convexities, probably neuro-intervention related.  CXR 01/26/2016 No active disease.  Probable chronic mild interstitial prominence. Endotracheal and NG tube in place. No pneumothorax.  2D echocardiogram - Left ventricle: The cavity size was normal. Systolic function was  normal. The estimated ejection fraction was in the range of 60% to 65%. Wall motion was normal; there were no regional wall motion abnormalities. The study is not technically sufficient to allow evaluation of LV diastolic function. - Aortic valve: Valve mobility  was mildly restricted. Transvalvular velocity was within the normal range. There was no stenosis. There was trivial regurgitation. - Mitral valve: Calcified annulus. Transvalvular velocity was within the normal range. There was no evidence for stenosis. There was trivial regurgitation. - Left atrium: The atrium was moderately dilated. - Right ventricle: The cavity size was normal. Wall thickness was normal. Systolic function was normal. - Atrial septum: No defect or patent foramen ovale was identified by color flow Doppler. - Tricuspid valve: There was mild regurgitation. - Pulmonary arteries: Systolic pressure was within the normal range. PA peak pressure: 29 mm Hg (S).  Component     Latest Ref Rng & Units 01/26/2016  Cholesterol     0 - 200 mg/dL 143  Triglycerides     <150 mg/dL 101  HDL Cholesterol     >40 mg/dL 43  Total CHOL/HDL Ratio     RATIO 3.3  VLDL     0 - 40 mg/dL 20  LDL (calc)     0 - 99 mg/dL 80  Hemoglobin A1C     4.8 - 5.6 % 5.6  Mean Plasma Glucose     mg/dL 114    Assessment: As you may recall, Adrienne [name] is a 84 y.o. Caucasian female with PMH of  atrial fibrillation on coumadin with GIB 3 weeks PTA was admitted on 01/25/16 for R sided weakness and receptive aphasia following a fall. Adrienne [name] did not receive IV t-PA per documenation due to hx GIB. INR 1.5 on arrival. CTA showed L MCA bifurcation occlusion, left ICA siphon moderate plaque, and intracranial stenosis b/l ACA, R MCA, L PCA branches. CTP showed penumbra. Adrienne [name] was taken to IR with complete TICI 3 revascularization of L MCA. MRI showed only a small volume of the left MCA territory infarct. EF 60-65%, LDL 80 and A1C 5.6. Her coumadin switched to eliquis. H&H stable no GIB. Adrienne [name] was discharged to CIR with eliquis and lipitor 20. Adrienne [name] had full recovery and d/c from CIR to home. Adrienne [name] followed with cardiology and lipitor stopped due to mild side effects of myalgia. On cardizem and metoprolol of rate control. On eliquis without  bleeding side effects except one episode of small varicose vein bleeding not requiring intervention. BP well controlled. On lipitor 10 now and tolerating well.   Plan:  - continue eliquis 5mg  bid and lipitor for stroke prevention. - check BP at home and record.  - follow up with cardiology Dr. Marlou Porch - Follow up with your primary care physician for stroke risk factor modification. Recommend maintain blood pressure goal <130/80, diabetes with hemoglobin A1c goal below 7.0% and lipids with LDL cholesterol goal below 70 mg/dL.  - healthy diet and regular exercise and avoid fall - follow up as needed.  No orders of the defined types were placed in this encounter.   No orders of the defined types were placed in this encounter.   Patient Instructions  - continue eliquis 5mg  bid and lipitor for stroke prevention. - check BP at home and record.  - follow up with cardiology Dr. Marlou Porch - Follow up  with your primary care physician for stroke risk factor modification. Recommend maintain blood pressure goal <130/80, diabetes with hemoglobin A1c goal below 7.0% and lipids with LDL cholesterol goal below 70 mg/dL.  - healthy diet and regular exercise and avoid fall - follow up as needed.   Rosalin Hawking, MD PhD [facility] [address] [place], [place] [address] [phone]

## 2018-02-08 ENCOUNTER — Other Ambulatory Visit: Payer: Self-pay

## 2018-02-08 ENCOUNTER — Encounter: Payer: Self-pay | Admitting: Obstetrics and Gynecology

## 2018-02-08 ENCOUNTER — Ambulatory Visit (INDEPENDENT_AMBULATORY_CARE_PROVIDER_SITE_OTHER): Payer: Medicare Other | Admitting: Obstetrics and Gynecology

## 2018-02-08 VITALS — BP 122/70 | HR 68 | Resp 18 | Ht 60.25 in | Wt 155.0 lb

## 2018-02-08 DIAGNOSIS — N813 Complete uterovaginal prolapse: Secondary | ICD-10-CM

## 2018-02-08 DIAGNOSIS — R300 Dysuria: Secondary | ICD-10-CM

## 2018-02-08 DIAGNOSIS — I6523 Occlusion and stenosis of bilateral carotid arteries: Secondary | ICD-10-CM

## 2018-02-08 DIAGNOSIS — Z4689 Encounter for fitting and adjustment of other specified devices: Secondary | ICD-10-CM | POA: Diagnosis not present

## 2018-02-08 LAB — POCT URINALYSIS DIPSTICK
BILIRUBIN UA: NEGATIVE
GLUCOSE UA: NEGATIVE
Ketones, UA: NEGATIVE
Nitrite, UA: POSITIVE
Odor: POSITIVE
PH UA: 5 (ref 5.0–8.0)
Protein, UA: POSITIVE — AB
UROBILINOGEN UA: 0.2 U/dL

## 2018-02-08 MED ORDER — SULFAMETHOXAZOLE-TRIMETHOPRIM 800-160 MG PO TABS: ORAL_TABLET | ORAL | 0 refills | Status: DC

## 2018-02-08 NOTE — Patient Instructions (Signed)

## 2018-02-08 NOTE — Progress Notes (Signed)
GYNECOLOGY  VISIT   HPI: 84 y.o.   Married  Caucasian  female   [phone] with Patient's last menstrual period was 09/12/1973 (approximate).   here for pessary recheck. Patient complains of having difficulty with urination. Reporting decreased frequency of urination during the day.  No dysuria.  No fever or back pain.  No nausea or vomiting.   Recent UTIs: 09/18/17 - E Coli tx with Bactrim.  2/519 - started Cefdinir instead of TMP/SMX.  Dr. Matilde Sprang changed this.  March 2019 - Tx with Nitrofurantoin.  PCP at [facility].  Had thrush and tx with Nystatin.  December 19, 2017 - Tx with Rocephin for continued infection.   PCP at [facility]   Has left sidewall mucosal vaginal ulcer.   Feels like she is more sore in the vaginal/vulvar area. Doing a sitz bath.  Has not been removing the pessary due to urinary retention when she would take it out or if she uses a smaller pessary.  She is very fearful of not being able to void.  A little vaginal bleeding every day.   Is not using vaginal estrogen cream.  Hx breast cancer and stroke.  Is on Elaquis.   Urine dip - trace RBC, 3+ WBC, nitrites, trace protein.   GYNECOLOGIC HISTORY: Patient's last menstrual period was 09/12/1973 (approximate). Contraception:  Postmenopausal Menopausal hormone therapy:  none Last mammogram:  11/15/17 -- done in [place], [place]. States she had a breast MRI as well which was normal Last pap smear:   11/25/15 Negative        OB History    Gravida  3   Para  3   Term  3   Preterm      AB      Living  3     SAB      TAB      Ectopic      Multiple      Live Births                 Patient Active Problem List   Diagnosis Date Noted  . Diplopia 01/29/2016  . GERD (gastroesophageal reflux disease) 01/29/2016  . Cerebrovascular accident (CVA) due to embolism of left middle cerebral artery ([place]) 01/29/2016  . Persistent atrial fibrillation ([place])   . Bleeding gastrointestinal   . Gait disturbance,  post-stroke 01/27/2016  . Fall   . Secondary hypertension, unspecified   . Cerebral infarction due to thrombosis of left middle cerebral artery (HCC) s/p mechanical thrombectomy   . Malnutrition of moderate degree 01/09/2016  . GI bleed 01/06/2016  . Chronic atrial fibrillation ([place]) 06/10/2015  . Pulmonary hypertension, secondary ([place]) 06/10/2015  . Chronic anticoagulation 06/10/2015  . Hyperlipidemia 06/10/2015  . Essential hypertension 06/10/2015  . History of stroke 06/10/2015  . Coronary artery disease due to lipid rich plaque 06/10/2015  . Afib ([place])   . Elevated uric acid in blood   . Arthritis of ankle   . Pulmonary HTN ([place])     Past Medical History:  Diagnosis Date  . Afib ([place])   . Arthritis of ankle   . CAD (coronary artery disease)   . Cancer [facility]) 2012   breast cancer-right  . Compression fracture of T12 vertebra (HCC)   . Diverticulosis   . Edema   . Elevated uric acid in blood   . GI bleed 12/2015  . Hyperlipidemia   . Hypertension   . Long-term (current) use of anticoagulants   . Moderate mitral regurgitation   .  Pulmonary HTN ([place])    Echo 1/19: EF 60-65, no RWMA, Ao sclerosis, trivial AI, mild MR, mod BAE, mod TR, PASP 49  . Stroke ([place]) 01/25/2016  . TIA (transient ischemic attack) 09/2011  . Urinary incontinence   . Urinary retention     Past Surgical History:  Procedure Laterality Date  . APPENDECTOMY    . BREAST SURGERY  2012   Rt.mastectomy--[place], [place]  2013  . CATARACT EXTRACTION, BILATERAL    . DILATION AND CURETTAGE OF UTERUS     in her early 64's for DUB  . ESOPHAGOGASTRODUODENOSCOPY Left 01/08/2016   Procedure: ESOPHAGOGASTRODUODENOSCOPY (EGD);  Surgeon: Arta Silence, MD;  Location: [facility];  Service: Endoscopy;  Laterality: Left;  . GALLBLADDER SURGERY    . MASTECTOMY Right   . RADIOLOGY WITH ANESTHESIA N/A 01/25/2016   Procedure: RADIOLOGY WITH ANESTHESIA;  Surgeon: Luanne Bras, MD;   Location: [place];  Service: Radiology;  Laterality: N/A;  . REPLACEMENT TOTAL KNEE BILATERAL    . TONSILLECTOMY AND ADENOIDECTOMY     as a child    Current Outpatient Medications  Medication Sig Dispense Refill  . acetaminophen (TYLENOL) 500 MG tablet Take 500-1,000 mg by mouth every 12 (twelve) hours as needed (pain).     Marland Kitchen amoxicillin (AMOXIL) 500 MG capsule 500 mg. Patient takes 4 tablets by mouth once prior to dental procedures    . apixaban (ELIQUIS) 5 MG TABS tablet Take 1 tablet (5 mg total) by mouth 2 (two) times daily. 180 tablet 1  . atorvastatin (LIPITOR) 10 MG tablet Take 1 tablet (10 mg total) by mouth daily. 90 tablet 1  . calcium-vitamin D 250-100 MG-UNIT tablet Take 2 tablets by mouth 2 (two) times daily.     . Cholecalciferol (VITAMIN D-3) 1000 units CAPS Take 1,000 Units by mouth daily.    Marland Kitchen diltiazem (CARDIZEM) 60 MG tablet Take 1 tablet (60 mg total) by mouth 3 (three) times daily. 270 tablet 3  . furosemide (LASIX) 20 MG tablet Take 20 mg by mouth daily as needed.    . metoprolol tartrate (LOPRESSOR) 100 MG tablet Take 1 tablet (100 mg total) by mouth 2 (two) times daily. 180 tablet 3  . Multiple Vitamins-Minerals (MULTIVITAMIN ADULT PO) Take 1 tablet by mouth daily.     Marland Kitchen omeprazole (PRILOSEC) 40 MG capsule Take 40 mg by mouth every other day.     Marland Kitchen PROAIR HFA 108 (90 Base) MCG/ACT inhaler Inhale 1-2 puffs into the lungs every 6 (six) hours as needed for wheezing or shortness of breath.     . triamcinolone (KENALOG) 0.025 % ointment Apply 1 application topically 2 (two) times daily. Use for 1 - 2 weeks as needed. 30 g 0  . vitamin C (ASCORBIC ACID) 500 MG tablet Take 1,000 mg by mouth daily.      No current facility-administered medications for this visit.      ALLERGIES: Valium [diazepam]; Amiodarone hcl [amiodarone]; Compazine [prochlorperazine]; Other; Thorazine [chlorpromazine]; Zometa [zoledronic acid]; and Ciprofloxacin  Family History  Problem Relation Age of  Onset  . Asthma Mother   . CVA Father   . Atrial fibrillation Sister        HAS PACER  . Pneumonia Brother   . Cancer Daughter        COLON CANCER  . Cancer Maternal Grandmother        breast cancer    Social History   Socioeconomic History  . Marital status: Married  Spouse name: Not on file  . Number of children: Not on file  . Years of education: Not on file  . Highest education level: Not on file  Occupational History  . Not on file  Social Needs  . Financial resource strain: Not on file  . Food insecurity:    Worry: Not on file    Inability: Not on file  . Transportation needs:    Medical: Not on file    Non-medical: Not on file  Tobacco Use  . Smoking status: Former Smoker    Types: Cigarettes  . Smokeless tobacco: Never Used  Substance and Sexual Activity  . Alcohol use: No    Alcohol/week: 0.0 oz  . Drug use: No  . Sexual activity: Yes    Partners: Male    Birth control/protection: Post-menopausal  Lifestyle  . Physical activity:    Days per week: Not on file    Minutes per session: Not on file  . Stress: Not on file  Relationships  . Social connections:    Talks on phone: Not on file    Gets together: Not on file    Attends religious service: Not on file    Active member of club or organization: Not on file    Attends meetings of clubs or organizations: Not on file    Relationship status: Not on file  . Intimate partner violence:    Fear of current or ex partner: Not on file    Emotionally abused: Not on file    Physically abused: Not on file    Forced sexual activity: Not on file  Other Topics Concern  . Not on file  Social History Narrative  . Not on file    Review of Systems  Constitutional: Negative.   HENT: Negative.   Eyes: Negative.   Respiratory: Negative.   Cardiovascular: Negative.   Gastrointestinal: Negative.   Endocrine: Negative.   Genitourinary: Negative.   Musculoskeletal: Negative.   Skin: Negative.    Allergic/Immunologic: Negative.   Neurological: Negative.   Hematological: Negative.   Psychiatric/Behavioral: Negative.     PHYSICAL EXAMINATION:    BP 122/70 (BP Location: Right Arm, Patient Position: Sitting, Cuff Size: Large)   Pulse 68   Resp 18   Ht 5' 0.25" (1.53 m)   Wt 155 lb (70.3 kg)   LMP 09/12/1973 (Approximate)   BMI 30.02 kg/m     General appearance: alert, cooperative and appears stated age   Pelvic: External genitalia:  no lesions              Urethra:  normal appearing urethra with no masses, tenderness or lesions              Bartholins and Skenes: normal                 Vagina: normal appearing vagina with normal color and discharge, no lesions.  Small ulceration of left vaginal sidewall 1 cm x 3 mm.  Mild granulation tissue.  Not bleeding.  Treated with silver nitrate.               Cervix: no lesions                Bimanual Exam:  Uterus:  normal size, contour, position, consistency, mobility, non-tender              Adnexa: no mass, fullness, tenderness            Sterile catheterization after verbal consent.  Prep with betadine.  125 cc obtained sterily with catheter.  Pessary removed, cleansed and replaced.   Chaperone was present for exam.  ASSESSMENT  Complete uterovaginal prolapse.  Vaginal ulceration.  Improving.  Urinary retention with smaller pessary use.  Dysuria.  Abnormal urine dip. UTI.  Hx breast cancer.  On Elaquis.  Hx stroke when anticoagulant was discontinued in the past.   PLAN  Continue #7 ring with support pessary.  I would not switch to a small pessary at a this time.  Urine micro and culture sent.  Start Bactrim DS po bid x 7 days.  Hydrate well.  Call for worsening symptoms.  FU in about 5 weeks, sooner as needed.    An After Visit Summary was printed and given to the patient.  ___25___ minutes face to face time of which over 50% was spent in counseling.

## 2018-02-09 LAB — URINALYSIS, MICROSCOPIC ONLY
Casts: NONE SEEN /lpf
RBC, UA: >30 /hpf — AB (ref 0–2)
WBC, UA: >30 /hpf — AB (ref 0–5)

## 2018-02-12 ENCOUNTER — Telehealth: Payer: Self-pay

## 2018-02-12 LAB — URINE CULTURE

## 2018-02-12 MED ORDER — LEVOFLOXACIN 250 MG PO TABS: 250.0000 mg | ORAL_TABLET | Freq: Every day | ORAL | 0 refills | Status: DC

## 2018-02-12 NOTE — Telephone Encounter (Signed)
-----   Message from Nunzio Cobbs, MD sent at 02/12/2018  2:02 PM EDT ----- Please contact with results of UC showing E Coli.  I am recommending Levofloxacin 250 mg orally x 3 days.  The Bactrim DS will not treat the infection.  The bacteria is resistant to multiple antibiotics tested.  She has an allergy listed to ciprofloxacin which caused elbow swelling.  The only other options are injectable medications.  Fosfomycin was not tested.

## 2018-02-12 NOTE — Telephone Encounter (Signed)
Spoke with patient. Advised of message as seen below from [place]. Patient verbalizes understanding. Rx for Levofloxacin 250 mg po daily x 3 days #3 0RF sent to pharmacy on file. [place] verbalizes understanding.  Routing to provider for final review. Patient agreeable to disposition. Will close encounter.

## 2018-02-16 ENCOUNTER — Ambulatory Visit: Payer: Medicare Other | Admitting: Obstetrics & Gynecology

## 2018-02-19 ENCOUNTER — Ambulatory Visit: Payer: Medicare Other | Admitting: Neurology

## 2018-02-22 DIAGNOSIS — T8090XA Unspecified complication following infusion and therapeutic injection, initial encounter: Secondary | ICD-10-CM | POA: Diagnosis not present

## 2018-02-27 DIAGNOSIS — I700 Atherosclerosis of aorta: Secondary | ICD-10-CM | POA: Diagnosis not present

## 2018-02-27 DIAGNOSIS — K219 Gastro-esophageal reflux disease without esophagitis: Secondary | ICD-10-CM | POA: Diagnosis not present

## 2018-02-27 DIAGNOSIS — Z6828 Body mass index (BMI) 28.0-28.9, adult: Secondary | ICD-10-CM | POA: Diagnosis not present

## 2018-02-27 DIAGNOSIS — N390 Urinary tract infection, site not specified: Secondary | ICD-10-CM | POA: Diagnosis not present

## 2018-02-27 DIAGNOSIS — R5383 Other fatigue: Secondary | ICD-10-CM | POA: Diagnosis not present

## 2018-02-28 DIAGNOSIS — Z6828 Body mass index (BMI) 28.0-28.9, adult: Secondary | ICD-10-CM | POA: Diagnosis not present

## 2018-02-28 DIAGNOSIS — R5383 Other fatigue: Secondary | ICD-10-CM | POA: Diagnosis not present

## 2018-02-28 DIAGNOSIS — K219 Gastro-esophageal reflux disease without esophagitis: Secondary | ICD-10-CM | POA: Diagnosis not present

## 2018-02-28 DIAGNOSIS — N390 Urinary tract infection, site not specified: Secondary | ICD-10-CM | POA: Diagnosis not present

## 2018-02-28 DIAGNOSIS — I700 Atherosclerosis of aorta: Secondary | ICD-10-CM | POA: Diagnosis not present

## 2018-03-12 ENCOUNTER — Other Ambulatory Visit: Payer: Self-pay | Admitting: *Deleted

## 2018-03-12 MED ORDER — APIXABAN 5 MG PO TABS: 5.0000 mg | ORAL_TABLET | Freq: Two times a day (BID) | ORAL | 1 refills | Status: DC

## 2018-03-12 MED ORDER — ATORVASTATIN CALCIUM 10 MG PO TABS: 10.0000 mg | ORAL_TABLET | Freq: Every day | ORAL | 2 refills | Status: DC

## 2018-03-12 NOTE — Telephone Encounter (Signed)
Eliquis 5mg  refill request received; pt is 84 yrs old, wt-70.3kg, Crea-1.07 on 09/20/17, last seen by Dr. Marlou Porch on 11/23/17; will send in refill to requested to pharmacy.

## 2018-03-21 ENCOUNTER — Other Ambulatory Visit: Payer: Self-pay

## 2018-03-21 ENCOUNTER — Ambulatory Visit (INDEPENDENT_AMBULATORY_CARE_PROVIDER_SITE_OTHER): Payer: Medicare Other | Admitting: Obstetrics and Gynecology

## 2018-03-21 ENCOUNTER — Encounter: Payer: Self-pay | Admitting: Obstetrics and Gynecology

## 2018-03-21 VITALS — BP 120/76 | HR 66 | Resp 18 | Ht 60.25 in | Wt 152.0 lb

## 2018-03-21 DIAGNOSIS — Z4689 Encounter for fitting and adjustment of other specified devices: Secondary | ICD-10-CM

## 2018-03-21 DIAGNOSIS — N813 Complete uterovaginal prolapse: Secondary | ICD-10-CM | POA: Diagnosis not present

## 2018-03-21 DIAGNOSIS — N765 Ulceration of vagina: Secondary | ICD-10-CM

## 2018-03-21 DIAGNOSIS — I6523 Occlusion and stenosis of bilateral carotid arteries: Secondary | ICD-10-CM | POA: Diagnosis not present

## 2018-03-21 MED ORDER — TRIAMCINOLONE ACETONIDE 0.025 % EX OINT: 1.0000 "application " | TOPICAL_OINTMENT | Freq: Two times a day (BID) | CUTANEOUS | 0 refills | Status: DC

## 2018-03-21 NOTE — Progress Notes (Signed)
GYNECOLOGY  VISIT   HPI: 84 y.o.   Married  Caucasian  female   [phone] with Patient's last menstrual period was 09/12/1973 (approximate).   here for pessary recheck. Patient states that she still has some vulvar itching and is out of Triamcinolone prescription.   Ecoli UTI in May 2019.  Took Levaquin.  Saw her PCP in follow up and had a negative urine.  She will also see Dr. Matilde Sprang in about 3 weeks for her 6 months follow up.   Some pink spotting with pessary use.  No pain.   Doing stationary bicycle riding.   GYNECOLOGIC HISTORY: Patient's last menstrual period was 09/12/1973 (approximate). Contraception:  Postmenopausal Menopausal hormone therapy:  none Last mammogram:   11/15/17 -- done in [place], [place]. States she had a breast MRI as well which was normal Last pap smear:   11/25/15 Negative        OB History    Gravida  3   Para  3   Term  3   Preterm      AB      Living  3     SAB      TAB      Ectopic      Multiple      Live Births                 Patient Active Problem List   Diagnosis Date Noted  . Diplopia 01/29/2016  . GERD (gastroesophageal reflux disease) 01/29/2016  . Cerebrovascular accident (CVA) due to embolism of left middle cerebral artery ([place]) 01/29/2016  . Persistent atrial fibrillation ([place])   . Bleeding gastrointestinal   . Gait disturbance, post-stroke 01/27/2016  . Fall   . Secondary hypertension, unspecified   . Cerebral infarction due to thrombosis of left middle cerebral artery (HCC) s/p mechanical thrombectomy   . Malnutrition of moderate degree 01/09/2016  . GI bleed 01/06/2016  . Chronic atrial fibrillation ([place]) 06/10/2015  . Pulmonary hypertension, secondary ([place]) 06/10/2015  . Chronic anticoagulation 06/10/2015  . Hyperlipidemia 06/10/2015  . Essential hypertension 06/10/2015  . History of stroke 06/10/2015  . Coronary artery disease due to lipid rich plaque 06/10/2015  . Afib ([place])   . Elevated uric acid in  blood   . Arthritis of ankle   . Pulmonary HTN ([place])     Past Medical History:  Diagnosis Date  . Afib ([place])   . Arthritis of ankle   . CAD (coronary artery disease)   . Cancer [facility]) 2012   breast cancer-right  . Compression fracture of T12 vertebra (HCC)   . Diverticulosis   . Edema   . Elevated uric acid in blood   . GI bleed 12/2015  . Hyperlipidemia   . Hypertension   . Long-term (current) use of anticoagulants   . Moderate mitral regurgitation   . Pulmonary HTN ([place])    Echo 1/19: EF 60-65, no RWMA, Ao sclerosis, trivial AI, mild MR, mod BAE, mod TR, PASP 49  . Stroke ([place]) 01/25/2016  . TIA (transient ischemic attack) 09/2011  . Urinary incontinence   . Urinary retention     Past Surgical History:  Procedure Laterality Date  . APPENDECTOMY    . BREAST SURGERY  2012   Rt.mastectomy--[place], [place]  2013  . CATARACT EXTRACTION, BILATERAL    . DILATION AND CURETTAGE OF UTERUS     in her early 37's for DUB  . ESOPHAGOGASTRODUODENOSCOPY Left 01/08/2016   Procedure:  ESOPHAGOGASTRODUODENOSCOPY (EGD);  Surgeon: Arta Silence, MD;  Location: [facility];  Service: Endoscopy;  Laterality: Left;  . GALLBLADDER SURGERY    . MASTECTOMY Right   . RADIOLOGY WITH ANESTHESIA N/A 01/25/2016   Procedure: RADIOLOGY WITH ANESTHESIA;  Surgeon: Luanne Bras, MD;  Location: [facility];  Service: Radiology;  Laterality: N/A;  . REPLACEMENT TOTAL KNEE BILATERAL    . TONSILLECTOMY AND ADENOIDECTOMY     as a child    Current Outpatient Medications  Medication Sig Dispense Refill  . acetaminophen (TYLENOL) 500 MG tablet Take 500-1,000 mg by mouth every 12 (twelve) hours as needed (pain).     Marland Kitchen amoxicillin (AMOXIL) 500 MG capsule 500 mg. Patient takes 4 tablets by mouth once prior to dental procedures    . apixaban (ELIQUIS) 5 MG TABS tablet Take 1 tablet (5 mg total) by mouth 2 (two) times daily. 180 tablet 1  . atorvastatin (LIPITOR) 10 MG tablet Take 1 tablet  (10 mg total) by mouth daily. 90 tablet 2  . calcium-vitamin D 250-100 MG-UNIT tablet Take 2 tablets by mouth 2 (two) times daily.     . Cholecalciferol (VITAMIN D-3) 1000 units CAPS Take 1,000 Units by mouth daily.    Marland Kitchen diltiazem (CARDIZEM) 60 MG tablet Take 1 tablet (60 mg total) by mouth 3 (three) times daily. 270 tablet 3  . furosemide (LASIX) 20 MG tablet Take 20 mg by mouth daily as needed.    . metoprolol tartrate (LOPRESSOR) 100 MG tablet Take 1 tablet (100 mg total) by mouth 2 (two) times daily. 180 tablet 3  . Multiple Vitamins-Minerals (MULTIVITAMIN ADULT PO) Take 1 tablet by mouth daily.     Marland Kitchen omeprazole (PRILOSEC) 40 MG capsule Take 40 mg by mouth every other day.     Marland Kitchen PROAIR HFA 108 (90 Base) MCG/ACT inhaler Inhale 1-2 puffs into the lungs every 6 (six) hours as needed for wheezing or shortness of breath.     . triamcinolone (KENALOG) 0.025 % ointment Apply 1 application topically 2 (two) times daily. Use for 1 - 2 weeks as needed. 30 g 0  . vitamin C (ASCORBIC ACID) 500 MG tablet Take 1,000 mg by mouth daily.      No current facility-administered medications for this visit.      ALLERGIES: Valium [diazepam]; Amiodarone hcl [amiodarone]; Compazine [prochlorperazine]; Other; Thorazine [chlorpromazine]; Zometa [zoledronic acid]; and Ciprofloxacin  Family History  Problem Relation Age of Onset  . Asthma Mother   . CVA Father   . Atrial fibrillation Sister        HAS PACER  . Pneumonia Brother   . Cancer Daughter        COLON CANCER  . Cancer Maternal Grandmother        breast cancer    Social History   Socioeconomic History  . Marital status: Married    Spouse name: Not on file  . Number of children: Not on file  . Years of education: Not on file  . Highest education level: Not on file  Occupational History  . Not on file  Social Needs  . Financial resource strain: Not on file  . Food insecurity:    Worry: Not on file    Inability: Not on file  .  Transportation needs:    Medical: Not on file    Non-medical: Not on file  Tobacco Use  . Smoking status: Former Smoker    Types: Cigarettes  . Smokeless tobacco: Never Used  Substance and Sexual  Activity  . Alcohol use: No    Alcohol/week: 0.0 oz  . Drug use: No  . Sexual activity: Yes    Partners: Male    Birth control/protection: Post-menopausal  Lifestyle  . Physical activity:    Days per week: Not on file    Minutes per session: Not on file  . Stress: Not on file  Relationships  . Social connections:    Talks on phone: Not on file    Gets together: Not on file    Attends religious service: Not on file    Active member of club or organization: Not on file    Attends meetings of clubs or organizations: Not on file    Relationship status: Not on file  . Intimate partner violence:    Fear of current or ex partner: Not on file    Emotionally abused: Not on file    Physically abused: Not on file    Forced sexual activity: Not on file  Other Topics Concern  . Not on file  Social History Narrative  . Not on file    Review of Systems  Constitutional: Negative.   HENT: Negative.   Eyes: Negative.   Respiratory: Negative.   Cardiovascular: Negative.   Gastrointestinal: Negative.   Endocrine: Negative.   Genitourinary:       Vulvar itching  Musculoskeletal: Negative.   Skin: Negative.   Allergic/Immunologic: Negative.   Neurological: Negative.   Hematological: Negative.   Psychiatric/Behavioral: Negative.     PHYSICAL EXAMINATION:    BP 120/76 (BP Location: Left Arm, Patient Position: Sitting, Cuff Size: Large)   Pulse 66   Resp 18   Ht 5' 0.25" (1.53 m)   Wt 152 lb (68.9 kg)   LMP 09/12/1973 (Approximate)   BMI 29.44 kg/m     General appearance: alert, cooperative and appears stated age    Pelvic: External genitalia:  no lesions              Urethra:  normal appearing urethra with no masses, tenderness or lesions              Bartholins and Skenes:  normal                 Vagina:  1.5 cm ulceration left vaginal sidewall, 1 cm ulceration right vaginal sidewall.               Cervix: no lesions                Bimanual Exam:  Uterus:  normal size, contour, position, consistency, mobility, non-tender              Adnexa: no mass, fullness, tenderness                Using #7 ring with with support.  Removed, cleansed, and replaced.   Chaperone was present for exam.  ASSESSMENT  Vaginal ulcerations from pessary use.  Complete uterovaginal prolapse.  Pessary maintenance.  Hx breast cancer.  Hx atrial fibrillaton and stroke.  On Elaquis.  Hx recurrent UTIs.   PLAN  Continue current pessary.  Return in one month.  If ever needs to keep pessary out for a short period of time, she will need a Foley catheter.    An After Visit Summary was printed and given to the patient.  ___15___ minutes face to face time of which over 50% was spent in counseling.

## 2018-04-18 DIAGNOSIS — N8111 Cystocele, midline: Secondary | ICD-10-CM | POA: Diagnosis not present

## 2018-04-18 DIAGNOSIS — N3020 Other chronic cystitis without hematuria: Secondary | ICD-10-CM | POA: Diagnosis not present

## 2018-04-19 ENCOUNTER — Ambulatory Visit: Payer: Medicare Other | Admitting: Obstetrics and Gynecology

## 2018-04-19 DIAGNOSIS — N390 Urinary tract infection, site not specified: Secondary | ICD-10-CM | POA: Diagnosis not present

## 2018-04-26 ENCOUNTER — Encounter: Payer: Self-pay | Admitting: Obstetrics and Gynecology

## 2018-04-26 ENCOUNTER — Ambulatory Visit (INDEPENDENT_AMBULATORY_CARE_PROVIDER_SITE_OTHER): Payer: Medicare Other | Admitting: Obstetrics and Gynecology

## 2018-04-26 VITALS — BP 130/72 | HR 68 | Temp 98.3°F | Resp 18 | Ht 60.25 in | Wt 150.0 lb

## 2018-04-26 DIAGNOSIS — Z4689 Encounter for fitting and adjustment of other specified devices: Secondary | ICD-10-CM | POA: Diagnosis not present

## 2018-04-26 DIAGNOSIS — I6523 Occlusion and stenosis of bilateral carotid arteries: Secondary | ICD-10-CM

## 2018-04-26 DIAGNOSIS — N813 Complete uterovaginal prolapse: Secondary | ICD-10-CM | POA: Diagnosis not present

## 2018-04-26 NOTE — Progress Notes (Signed)
GYNECOLOGY  VISIT   HPI: 84 y.o.   Married  Caucasian  female   [phone] with Patient's last menstrual period was 09/12/1973 (approximate).   here for 1 month pessary check. Patient has UTI and is being treated by PCP for this.  On Cefuroxime.   Her urine was cloudy, and this was her clue.   Using a #7 ring with support pessary.  Still having some vaginal spotting on her pad.  No pain.  Her bladder does protrude out from behind the pessary. States she is voiding well. At last visit on 03/21/18 had 1.5 cm ulceration left vaginal sidewall, 1 cm ulceration right vaginal sidewall.  Using Triamcinolone for vulvar itching.  Not consistent with her use of this.  Does sitz baths also.  Does absorbant pads constantly.    Saw Dr. Matilde Sprang last week for a recheck.  She had a bladder US, and she is not retaining urine.  She will now follow up only prn with him.  Has 9 great grandchildren.  GYNECOLOGIC HISTORY: Patient's last menstrual period was 09/12/1973 (approximate). Contraception:  Postmenopausal Menopausal hormone therapy:  none Last mammogram:  11/15/17 -- done in [place], [place]. States she had a breast MRI as well which was normal Last pap smear:   11/25/15 Negative        OB History    Gravida  3   Para  3   Term  3   Preterm      AB      Living  3     SAB      TAB      Ectopic      Multiple      Live Births                 Patient Active Problem List   Diagnosis Date Noted  . Diplopia 01/29/2016  . GERD (gastroesophageal reflux disease) 01/29/2016  . Cerebrovascular accident (CVA) due to embolism of left middle cerebral artery ([place]) 01/29/2016  . Persistent atrial fibrillation ([place])   . Bleeding gastrointestinal   . Gait disturbance, post-stroke 01/27/2016  . Fall   . Secondary hypertension, unspecified   . Cerebral infarction due to thrombosis of left middle cerebral artery (HCC) s/p mechanical thrombectomy   . Malnutrition of moderate degree  01/09/2016  . GI bleed 01/06/2016  . Chronic atrial fibrillation ([place]) 06/10/2015  . Pulmonary hypertension, secondary ([place]) 06/10/2015  . Chronic anticoagulation 06/10/2015  . Hyperlipidemia 06/10/2015  . Essential hypertension 06/10/2015  . History of stroke 06/10/2015  . Coronary artery disease due to lipid rich plaque 06/10/2015  . Afib ([place])   . Elevated uric acid in blood   . Arthritis of ankle   . Pulmonary HTN ([place])     Past Medical History:  Diagnosis Date  . Afib ([place])   . Arthritis of ankle   . CAD (coronary artery disease)   . Cancer [facility]) 2012   breast cancer-right  . Compression fracture of T12 vertebra (HCC)   . Diverticulosis   . Edema   . Elevated uric acid in blood   . GI bleed 12/2015  . Hyperlipidemia   . Hypertension   . Long-term (current) use of anticoagulants   . Moderate mitral regurgitation   . Pulmonary HTN ([place])    Echo 1/19: EF 60-65, no RWMA, Ao sclerosis, trivial AI, mild MR, mod BAE, mod TR, PASP 49  . Stroke ([place]) 01/25/2016  . TIA (transient ischemic attack) 09/2011  . Urinary  incontinence   . Urinary retention     Past Surgical History:  Procedure Laterality Date  . APPENDECTOMY    . BREAST SURGERY  2012   Rt.mastectomy--[place], [place]  2013  . CATARACT EXTRACTION, BILATERAL    . DILATION AND CURETTAGE OF UTERUS     in her early 84's for DUB  . ESOPHAGOGASTRODUODENOSCOPY Left 01/08/2016   Procedure: ESOPHAGOGASTRODUODENOSCOPY (EGD);  Surgeon: Arta Silence, MD;  Location: [facility];  Service: Endoscopy;  Laterality: Left;  . GALLBLADDER SURGERY    . MASTECTOMY Right   . RADIOLOGY WITH ANESTHESIA N/A 01/25/2016   Procedure: RADIOLOGY WITH ANESTHESIA;  Surgeon: Luanne Bras, MD;  Location: [facility];  Service: Radiology;  Laterality: N/A;  . REPLACEMENT TOTAL KNEE BILATERAL    . TONSILLECTOMY AND ADENOIDECTOMY     as a child    Current Outpatient Medications  Medication Sig Dispense Refill  .  acetaminophen (TYLENOL) 500 MG tablet Take 500-1,000 mg by mouth every 12 (twelve) hours as needed (pain).     Marland Kitchen amoxicillin (AMOXIL) 500 MG capsule 500 mg. Patient takes 4 tablets by mouth once prior to dental procedures    . apixaban (ELIQUIS) 5 MG TABS tablet Take 1 tablet (5 mg total) by mouth 2 (two) times daily. 180 tablet 1  . atorvastatin (LIPITOR) 10 MG tablet Take 1 tablet (10 mg total) by mouth daily. 90 tablet 2  . calcium-vitamin D 250-100 MG-UNIT tablet Take 2 tablets by mouth 2 (two) times daily.     . cefUROXime (CEFTIN) 250 MG tablet TK 1 T PO Q 12 HOURS FOR 7 DAYS  0  . Cholecalciferol (VITAMIN D-3) 1000 units CAPS Take 1,000 Units by mouth daily.    Marland Kitchen diltiazem (CARDIZEM) 60 MG tablet Take 1 tablet (60 mg total) by mouth 3 (three) times daily. 270 tablet 3  . furosemide (LASIX) 20 MG tablet Take 20 mg by mouth daily as needed.    . metoprolol tartrate (LOPRESSOR) 100 MG tablet Take 1 tablet (100 mg total) by mouth 2 (two) times daily. 180 tablet 3  . Multiple Vitamins-Minerals (MULTIVITAMIN ADULT PO) Take 1 tablet by mouth daily.     Marland Kitchen omeprazole (PRILOSEC) 40 MG capsule Take 40 mg by mouth every other day.     Marland Kitchen PROAIR HFA 108 (90 Base) MCG/ACT inhaler Inhale 1-2 puffs into the lungs every 6 (six) hours as needed for wheezing or shortness of breath.     . triamcinolone (KENALOG) 0.025 % ointment Apply 1 application topically 2 (two) times daily. Use for 1 week as needed. 30 g 0  . vitamin C (ASCORBIC ACID) 500 MG tablet Take 1,000 mg by mouth daily.      No current facility-administered medications for this visit.      ALLERGIES: Valium [diazepam]; Amiodarone hcl [amiodarone]; Compazine [prochlorperazine]; Other; Thorazine [chlorpromazine]; Zometa [zoledronic acid]; and Ciprofloxacin  Family History  Problem Relation Age of Onset  . Asthma Mother   . CVA Father   . Atrial fibrillation Sister        HAS PACER  . Pneumonia Brother   . Cancer Daughter        COLON CANCER   . Cancer Maternal Grandmother        breast cancer    Social History   Socioeconomic History  . Marital status: Married    Spouse name: Not on file  . Number of children: Not on file  . Years of education: Not on file  .  Highest education level: Not on file  Occupational History  . Not on file  Social Needs  . Financial resource strain: Not on file  . Food insecurity:    Worry: Not on file    Inability: Not on file  . Transportation needs:    Medical: Not on file    Non-medical: Not on file  Tobacco Use  . Smoking status: Former Smoker    Types: Cigarettes  . Smokeless tobacco: Never Used  Substance and Sexual Activity  . Alcohol use: No    Alcohol/week: 0.0 standard drinks  . Drug use: No  . Sexual activity: Yes    Partners: Male    Birth control/protection: Post-menopausal  Lifestyle  . Physical activity:    Days per week: Not on file    Minutes per session: Not on file  . Stress: Not on file  Relationships  . Social connections:    Talks on phone: Not on file    Gets together: Not on file    Attends religious service: Not on file    Active member of club or organization: Not on file    Attends meetings of clubs or organizations: Not on file    Relationship status: Not on file  . Intimate partner violence:    Fear of current or ex partner: Not on file    Emotionally abused: Not on file    Physically abused: Not on file    Forced sexual activity: Not on file  Other Topics Concern  . Not on file  Social History Narrative  . Not on file    Review of Systems  Cardiovascular: Positive for leg swelling.  Genitourinary:       Unscheduled spotting Vulvar itching Loss of urine spontaneously Night urination  All other systems reviewed and are negative.   PHYSICAL EXAMINATION:    BP 130/72 (BP Location: Left Arm, Patient Position: Sitting, Cuff Size: Normal)   Pulse 68   Temp 98.3 F (36.8 C) (Oral)   Resp 18   Ht 5' 0.25" (1.53 m)   Wt 150 lb (68  kg)   LMP 09/12/1973 (Approximate)   BMI 29.05 kg/m     General appearance: alert, cooperative and appears stated age   Pelvic: External genitalia:  no lesions              Urethra:  normal appearing urethra with no masses, tenderness or lesions              Bartholins and Skenes: normal                 Vagina: right vaginal wall with 2 mm mucosal skin tag and no ulceration.  Left vaginal wall with shallow nonbleeding ulceration measuring 1 x 2 cm.  No granulation tissue.               Cervix: no lesions                Bimanual Exam:  Uterus:  normal size, contour, position, consistency, mobility, non-tender              Adnexa: no mass, fullness, tenderness        Pessary removed, cleansed, and replaced using exam gel.   Chaperone was present for exam.  ASSESSMENT  Vaginal ulcerations from pessary use.  Complete uterovaginal prolapse.  Pessary maintenance.  Hx breast cancer.  Hx atrial fibrillaton and stroke.  On Elaquis.  Hx recurrent UTIs.   PLAN  We discussed  options for care for an alternative pessary.  Return in 4 weeks.  Will try fitting for a Gelhorn then.   We are trying to avoid surgical care as she is on Elaquis and at risk of cardiovascular events.  An After Visit Summary was printed and given to the patient.  __25____ minutes face to face time of which over 50% was spent in counseling.

## 2018-04-27 ENCOUNTER — Ambulatory Visit: Payer: Medicare Other | Admitting: Obstetrics and Gynecology

## 2018-05-08 ENCOUNTER — Encounter: Payer: Self-pay | Admitting: Cardiology

## 2018-05-08 DIAGNOSIS — R3911 Hesitancy of micturition: Secondary | ICD-10-CM | POA: Diagnosis not present

## 2018-05-24 ENCOUNTER — Encounter: Payer: Self-pay | Admitting: Obstetrics and Gynecology

## 2018-05-24 ENCOUNTER — Other Ambulatory Visit: Payer: Self-pay

## 2018-05-24 ENCOUNTER — Ambulatory Visit (INDEPENDENT_AMBULATORY_CARE_PROVIDER_SITE_OTHER): Payer: Medicare Other | Admitting: Obstetrics and Gynecology

## 2018-05-24 VITALS — BP 136/66 | HR 76 | Resp 14 | Ht 60.25 in | Wt 147.0 lb

## 2018-05-24 DIAGNOSIS — Z4689 Encounter for fitting and adjustment of other specified devices: Secondary | ICD-10-CM

## 2018-05-24 DIAGNOSIS — I6523 Occlusion and stenosis of bilateral carotid arteries: Secondary | ICD-10-CM

## 2018-05-24 DIAGNOSIS — N813 Complete uterovaginal prolapse: Secondary | ICD-10-CM | POA: Diagnosis not present

## 2018-05-24 NOTE — Progress Notes (Signed)
GYNECOLOGY  VISIT   HPI: 84 y.o.   Married  Caucasian  female   [phone] with Patient's last menstrual period was 09/12/1973 (approximate).   here for 4 week recheck    Using #7 ring with support pessary.  Brings it in today in a plastic bag. Has left vaginal sidewall ulceration 1 x 2 cm.   Still dealing with urinary tract infections.  GYNECOLOGIC HISTORY: Patient's last menstrual period was 09/12/1973 (approximate). Contraception:  Postmenopausal Menopausal hormone therapy:  none Last mammogram:  11/15/17 -- done in [place], [place]. States she had a breast MRI as well which was normal Last pap smear:   11/25/15 Negative        OB History    Gravida  3   Para  3   Term  3   Preterm      AB      Living  3     SAB      TAB      Ectopic      Multiple      Live Births                 Patient Active Problem List   Diagnosis Date Noted  . Diplopia 01/29/2016  . GERD (gastroesophageal reflux disease) 01/29/2016  . Cerebrovascular accident (CVA) due to embolism of left middle cerebral artery ([place]) 01/29/2016  . Persistent atrial fibrillation ([place])   . Bleeding gastrointestinal   . Gait disturbance, post-stroke 01/27/2016  . Fall   . Secondary hypertension, unspecified   . Cerebral infarction due to thrombosis of left middle cerebral artery (HCC) s/p mechanical thrombectomy   . Malnutrition of moderate degree 01/09/2016  . GI bleed 01/06/2016  . Chronic atrial fibrillation ([place]) 06/10/2015  . Pulmonary hypertension, secondary ([place]) 06/10/2015  . Chronic anticoagulation 06/10/2015  . Hyperlipidemia 06/10/2015  . Essential hypertension 06/10/2015  . History of stroke 06/10/2015  . Coronary artery disease due to lipid rich plaque 06/10/2015  . Afib ([place])   . Elevated uric acid in blood   . Arthritis of ankle   . Pulmonary HTN ([place])     Past Medical History:  Diagnosis Date  . Afib ([place])   . Arthritis of ankle   . CAD (coronary artery disease)   . Cancer  [facility]) 2012   breast cancer-right  . Compression fracture of T12 vertebra (HCC)   . Diverticulosis   . Edema   . Elevated uric acid in blood   . GI bleed 12/2015  . Hyperlipidemia   . Hypertension   . Long-term (current) use of anticoagulants   . Moderate mitral regurgitation   . Pulmonary HTN ([place])    Echo 1/19: EF 60-65, no RWMA, Ao sclerosis, trivial AI, mild MR, mod BAE, mod TR, PASP 49  . Stroke ([place]) 01/25/2016  . TIA (transient ischemic attack) 09/2011  . Urinary incontinence   . Urinary retention     Past Surgical History:  Procedure Laterality Date  . APPENDECTOMY    . BREAST SURGERY  2012   Rt.mastectomy--[place], [place]  2013  . CATARACT EXTRACTION, BILATERAL    . DILATION AND CURETTAGE OF UTERUS     in her early 25's for DUB  . ESOPHAGOGASTRODUODENOSCOPY Left 01/08/2016   Procedure: ESOPHAGOGASTRODUODENOSCOPY (EGD);  Surgeon: Arta Silence, MD;  Location: [facility];  Service: Endoscopy;  Laterality: Left;  . GALLBLADDER SURGERY    . MASTECTOMY Right   . RADIOLOGY WITH ANESTHESIA N/A 01/25/2016  Procedure: RADIOLOGY WITH ANESTHESIA;  Surgeon: Luanne Bras, MD;  Location: [facility];  Service: Radiology;  Laterality: N/A;  . REPLACEMENT TOTAL KNEE BILATERAL    . TONSILLECTOMY AND ADENOIDECTOMY     as a child    Current Outpatient Medications  Medication Sig Dispense Refill  . acetaminophen (TYLENOL) 500 MG tablet Take 500-1,000 mg by mouth every 12 (twelve) hours as needed (pain).     Marland Kitchen amoxicillin (AMOXIL) 500 MG capsule 500 mg. Patient takes 4 tablets by mouth once prior to dental procedures    . apixaban (ELIQUIS) 5 MG TABS tablet Take 1 tablet (5 mg total) by mouth 2 (two) times daily. 180 tablet 1  . atorvastatin (LIPITOR) 10 MG tablet Take 1 tablet (10 mg total) by mouth daily. 90 tablet 2  . calcium-vitamin D 250-100 MG-UNIT tablet Take 2 tablets by mouth 2 (two) times daily.     . Cholecalciferol (VITAMIN D-3) 1000 units CAPS  Take 1,000 Units by mouth daily.    Marland Kitchen diltiazem (CARDIZEM) 60 MG tablet Take 1 tablet (60 mg total) by mouth 3 (three) times daily. 270 tablet 3  . furosemide (LASIX) 20 MG tablet Take 20 mg by mouth daily as needed.    . metoprolol tartrate (LOPRESSOR) 100 MG tablet Take 1 tablet (100 mg total) by mouth 2 (two) times daily. 180 tablet 3  . Multiple Vitamins-Minerals (MULTIVITAMIN ADULT PO) Take 1 tablet by mouth daily.     Marland Kitchen omeprazole (PRILOSEC) 40 MG capsule Take 40 mg by mouth every other day.     Marland Kitchen PROAIR HFA 108 (90 Base) MCG/ACT inhaler Inhale 1-2 puffs into the lungs every 6 (six) hours as needed for wheezing or shortness of breath.     . triamcinolone (KENALOG) 0.025 % ointment Apply 1 application topically 2 (two) times daily. Use for 1 week as needed. 30 g 0  . vitamin C (ASCORBIC ACID) 500 MG tablet Take 1,000 mg by mouth daily.      No current facility-administered medications for this visit.      ALLERGIES: Valium [diazepam]; Amiodarone hcl [amiodarone]; Compazine [prochlorperazine]; Other; Thorazine [chlorpromazine]; Zometa [zoledronic acid]; and Ciprofloxacin  Family History  Problem Relation Age of Onset  . Asthma Mother   . CVA Father   . Atrial fibrillation Sister        HAS PACER  . Pneumonia Brother   . Cancer Daughter        COLON CANCER  . Cancer Maternal Grandmother        breast cancer    Social History   Socioeconomic History  . Marital status: Married    Spouse name: Not on file  . Number of children: Not on file  . Years of education: Not on file  . Highest education level: Not on file  Occupational History  . Not on file  Social Needs  . Financial resource strain: Not on file  . Food insecurity:    Worry: Not on file    Inability: Not on file  . Transportation needs:    Medical: Not on file    Non-medical: Not on file  Tobacco Use  . Smoking status: Former Smoker    Types: Cigarettes  . Smokeless tobacco: Never Used  Substance and Sexual  Activity  . Alcohol use: No    Alcohol/week: 0.0 standard drinks  . Drug use: No  . Sexual activity: Yes    Partners: Male    Birth control/protection: Post-menopausal  Lifestyle  . Physical  activity:    Days per week: Not on file    Minutes per session: Not on file  . Stress: Not on file  Relationships  . Social connections:    Talks on phone: Not on file    Gets together: Not on file    Attends religious service: Not on file    Active member of club or organization: Not on file    Attends meetings of clubs or organizations: Not on file    Relationship status: Not on file  . Intimate partner violence:    Fear of current or ex partner: Not on file    Emotionally abused: Not on file    Physically abused: Not on file    Forced sexual activity: Not on file  Other Topics Concern  . Not on file  Social History Narrative  . Not on file    Review of Systems  Constitutional: Negative.   HENT: Negative.   Eyes: Negative.   Respiratory: Negative.   Cardiovascular: Negative.   Gastrointestinal: Negative.   Endocrine: Negative.   Genitourinary: Positive for vaginal bleeding.       Vulvar/vaginal itching   Musculoskeletal: Negative.   Skin: Negative.   Allergic/Immunologic: Negative.   Neurological: Negative.   Hematological: Negative.   Psychiatric/Behavioral: Negative.     PHYSICAL EXAMINATION:    BP 136/66 (BP Location: Left Arm, Patient Position: Sitting, Cuff Size: Large)   Pulse 76   Resp 14   Ht 5' 0.25" (1.53 m)   Wt 147 lb (66.7 kg)   LMP 09/12/1973 (Approximate)   BMI 28.47 kg/m     General appearance: alert, cooperative and appears stated age   Pelvic: External genitalia:  no lesions              Urethra:  normal appearing urethra with no masses, tenderness or lesions              Bartholins and Skenes: normal                 Vagina: left vaginal sidewall ulceration 1 cm x 1 cm.  Has polypoid mucosal changes of bilateral vaginal sidewalls.                Cervix: no lesions                Bimanual Exam:  Uterus:  normal size, contour, position, consistency, mobility, non-tender              Adnexa: no mass, fullness, tenderness              3 inch Gelhorn pessary fitted and comfortable for the patient. Able to do maneuvers and maintain the pessary.  Patient's #7 ring pessary with support placed in the vagina.  Chaperone was present for exam.  ASSESSMENT  Vaginal ulcerations from pessary use.  Complete uterovaginal prolapse.  Pessary maintenance.  Hx breast cancer.  Hx atrial fibrillaton and stroke.  On Elaquis.  Hx recurrent UTIs.  PLAN  We will order a 3 inch, 76 mm gelhorn short stem pessary. I am hopeful that this will hold her bladder better and result in less vaginal sidewall ulceration and less recurrent UTIs if her prolapse is better reduced.    An After Visit Summary was printed and given to the patient.  __15____ minutes face to face time of which over 50% was spent in counseling.

## 2018-05-28 DIAGNOSIS — Z87440 Personal history of urinary (tract) infections: Secondary | ICD-10-CM | POA: Diagnosis not present

## 2018-05-29 ENCOUNTER — Encounter: Payer: Self-pay | Admitting: Cardiology

## 2018-05-29 ENCOUNTER — Ambulatory Visit (INDEPENDENT_AMBULATORY_CARE_PROVIDER_SITE_OTHER): Payer: Medicare Other | Admitting: Cardiology

## 2018-05-29 VITALS — BP 130/80 | HR 65 | Ht 60.25 in | Wt 149.6 lb

## 2018-05-29 DIAGNOSIS — Z7901 Long term (current) use of anticoagulants: Secondary | ICD-10-CM | POA: Diagnosis not present

## 2018-05-29 DIAGNOSIS — I482 Chronic atrial fibrillation, unspecified: Secondary | ICD-10-CM

## 2018-05-29 DIAGNOSIS — I6523 Occlusion and stenosis of bilateral carotid arteries: Secondary | ICD-10-CM

## 2018-05-29 DIAGNOSIS — E785 Hyperlipidemia, unspecified: Secondary | ICD-10-CM | POA: Diagnosis not present

## 2018-05-29 DIAGNOSIS — Z79899 Other long term (current) drug therapy: Secondary | ICD-10-CM | POA: Diagnosis not present

## 2018-05-29 NOTE — Progress Notes (Signed)
Cardiology Office Note   Date:  05/29/2018   ID:  Adrienne [name], DOB 1928-03-19, MRN [id number]  PCP:  Kathyrn Lass, MD  Cardiologist:   Candee Furbish, MD     History of Present Illness: Adrienne [name] is a 84 y.o. female (I see her husband, from [place]) here for follow up of atrial fibrillation, pulmonary hypertension, coronary artery disease, mitral regurgitation on chronic anticoagulation patient of Dr. Kathyrn Lass.  Admitted in April 2017 with intermittent bloody stools. Anticoagulation was placed on hold. GI work up unremarkable (Hiatal hernia and diverticulosis). On Jan 25, 2016 had right side weakness and was found in bathroom by husband. Coumadin was restarted about a week prior to this, INR 1.5.Left MCA infarct. Interventional radiology. Transitioned to Eliquis on May 18.    Atrial fibrillation: In the past she has tried amiodarone but now has this listed under allergies. Currently under rate control strategy. Taking diltiazem, metoprolol. Had minor stroke years ago on Zometa in 2013 breast cancer. Right breast.   Coronary artery disease: Detected on catheterization in February 2013, nonobstructive. Echo on 09/2014 showed moderate mitral regurgitation. Normal pulmonary pressures.  Hyperlipidemia-has had aversion to statins in the past. Used to take [employer].  She has had prior visits with Dr. Sabra Heck for dizziness. Vertigo-like symptoms. During that visit she was concerned about cardiology referral for management of A. fib and Coumadin.  Pulmonary hypertension: secondary, been steady for years. In fact last echocardiogram reassuring with no significant elevation pressures.  Having SOB. Chronic. Dyspnea  11/23/17 - spoke about carotids, reviewed studies, stroke. AFIB discussion. No CP, no SOB. Edema chronic.   05/29/2018- chief complaint-follow-up of atrial fibrillation.  Overall she has been doing quite well.  Carotid arteries were mild bilaterally.  She has been  taking 10 mg of atorvastatin.  She used to be adverse to this.  LDL 52 in 2018.  Excellent.  Denies any fevers chills nausea vomiting syncope bleeding orthopnea PND. She soes have cough more often.   Past Medical History:  Diagnosis Date  . Afib ([place])   . Arthritis of ankle   . CAD (coronary artery disease)   . Cancer [facility]) 2012   breast cancer-right  . Compression fracture of T12 vertebra (HCC)   . Diverticulosis   . Edema   . Elevated uric acid in blood   . GI bleed 12/2015  . Hyperlipidemia   . Hypertension   . Long-term (current) use of anticoagulants   . Moderate mitral regurgitation   . Pulmonary HTN ([place])    Echo 1/19: EF 60-65, no RWMA, Ao sclerosis, trivial AI, mild MR, mod BAE, mod TR, PASP 49  . Stroke ([place]) 01/25/2016  . TIA (transient ischemic attack) 09/2011  . Urinary incontinence   . Urinary retention     Past Surgical History:  Procedure Laterality Date  . APPENDECTOMY    . BREAST SURGERY  2012   Rt.mastectomy--[place]  2013  . CATARACT EXTRACTION, BILATERAL    . DILATION AND CURETTAGE OF UTERUS     in her early 64's for DUB  . ESOPHAGOGASTRODUODENOSCOPY Left 01/08/2016   Procedure: ESOPHAGOGASTRODUODENOSCOPY (EGD);  Surgeon: Arta Silence, MD;  Location: [facility];  Service: Endoscopy;  Laterality: Left;  . GALLBLADDER SURGERY    . MASTECTOMY Right   . RADIOLOGY WITH ANESTHESIA N/A 01/25/2016   Procedure: RADIOLOGY WITH ANESTHESIA;  Surgeon: Luanne Bras, MD;  Location: [facility];  Service: Radiology;  Laterality: N/A;  .  REPLACEMENT TOTAL KNEE BILATERAL    . TONSILLECTOMY AND ADENOIDECTOMY     as a child     Current Outpatient Medications  Medication Sig Dispense Refill  . acetaminophen (TYLENOL) 500 MG tablet Take 500-1,000 mg by mouth every 12 (twelve) hours as needed (pain).     Marland Kitchen apixaban (ELIQUIS) 5 MG TABS tablet Take 1 tablet (5 mg total) by mouth 2 (two) times daily. 180 tablet 1  . atorvastatin (LIPITOR)  10 MG tablet Take 1 tablet (10 mg total) by mouth daily. 90 tablet 2  . calcium-vitamin D 250-100 MG-UNIT tablet Take 2 tablets by mouth 2 (two) times daily.     . Cholecalciferol (VITAMIN D-3) 1000 units CAPS Take 1,000 Units by mouth daily.    Marland Kitchen diltiazem (CARDIZEM) 60 MG tablet Take 1 tablet (60 mg total) by mouth 3 (three) times daily. 270 tablet 3  . furosemide (LASIX) 20 MG tablet Take 20 mg by mouth daily as needed.    . metoprolol tartrate (LOPRESSOR) 100 MG tablet Take 1 tablet (100 mg total) by mouth 2 (two) times daily. 180 tablet 3  . Multiple Vitamins-Minerals (MULTIVITAMIN ADULT PO) Take 1 tablet by mouth daily.     Marland Kitchen omeprazole (PRILOSEC) 40 MG capsule Take 40 mg by mouth every other day.     . vitamin C (ASCORBIC ACID) 500 MG tablet Take 1,000 mg by mouth daily.      No current facility-administered medications for this visit.     Allergies:   Valium [diazepam]; Amiodarone hcl [amiodarone]; Compazine [prochlorperazine]; Other; Thorazine [chlorpromazine]; Zometa [zoledronic acid]; and Ciprofloxacin    Social History:  The patient  reports that she has quit smoking. Her smoking use included cigarettes. She has never used smokeless tobacco. She reports that she does not drink alcohol or use drugs.   Family History:  The patient's family history includes Asthma in her mother; Atrial fibrillation in her sister; CVA in her father; Cancer in her daughter and maternal grandmother; Pneumonia in her brother.    ROS:  Please see the history of present illness.     PHYSICAL EXAM: VS:  BP 130/80   Pulse 65   Ht 5' 0.25" (1.53 m)   Wt 149 lb 9.6 oz (67.9 kg)   LMP 09/12/1973 (Approximate)   SpO2 95%   BMI 28.97 kg/m  , BMI Body mass index is 28.97 kg/m. GEN: Well nourished, well developed, in no acute distress  HEENT: normal  Neck: no JVD, carotid bruits, or masses Cardiac: irreg irreg normal rate; no murmurs, rubs, or gallops,no edema  Respiratory:  clear to auscultation  bilaterally, normal work of breathing GI: soft, nontender, nondistended, + BS MS: no deformity or atrophy  Skin: warm and dry, no rash Neuro:  Alert and Oriented x 3, Strength and sensation are intact Psych: euthymic mood, full affect    EKG:  11/23/17 - AFIB 63, NSSTW changes.  06/10/15-atrial fibrillation heart rate 64, no other abnormalities. Personally viewed-prior Prior EKG from 10/29/11 shows atrial fibrillation with heart rate 110 bpm, no other abnormalities.  ECHO 01/26/16 - Left ventricle: The cavity size was normal. Systolic function was   normal. The estimated ejection fraction was in the range of 60%   to 65%. Wall motion was normal; there were no regional wall   motion abnormalities. The study is not technically sufficient to   allow evaluation of LV diastolic function. - Aortic valve: Valve mobility was mildly restricted. Transvalvular   velocity was within the  normal range. There was no stenosis.   There was trivial regurgitation. - Mitral valve: Calcified annulus. Transvalvular velocity was   within the normal range. There was no evidence for stenosis.   There was trivial regurgitation. - Left atrium: The atrium was moderately dilated. - Right ventricle: The cavity size was normal. Wall thickness was   normal. Systolic function was normal. - Atrial septum: No defect or patent foramen ovale was identified   by color flow Doppler. - Tricuspid valve: There was mild regurgitation. - Pulmonary arteries: Systolic pressure was within the normal   range. PA peak pressure: 29 mm Hg (S).  Recent Labs: 09/20/2017: BUN 22; Creatinine, Ser 1.07; Hemoglobin 12.1; NT-Pro BNP 2,309; Platelets 325; Potassium 4.9; Sodium 139    Lipid Panel    Component Value Date/Time   CHOL 121 01/10/2017 0849   TRIG 76 01/10/2017 0849   HDL 54 01/10/2017 0849   CHOLHDL 2.2 01/10/2017 0849   CHOLHDL 3.3 01/26/2016 0300   VLDL 20 01/26/2016 0300   LDLCALC 52 01/10/2017 0849      Wt Readings  from Last 3 Encounters:  05/29/18 149 lb 9.6 oz (67.9 kg)  05/24/18 147 lb (66.7 kg)  04/26/18 150 lb (68 kg)      Other studies Reviewed: Additional studies/ records that were reviewed today include: office notes, labs, EKG, echoew of the above records demonstrates: as above   ASSESSMENT AND PLAN:  1. Permanent atrial fibrillation -Since 2013. Failed amiodarone. -This patients CHA2DS2-VASc Score and unadjusted Ischemic Stroke Rate (% per year) is equal to 9.7 % stroke rate/year from a score of 6  Above score calculated as 1 point each if present [CHF, HTN, DM, Vascular=MI/PAD/Aortic Plaque, Age if 65-74, or Female] Above score calculated as 2 points each if present [Age > 75, or Stroke/TIA/TE]  -Continuing with chronic anti-coagulation. Eliquis. Off coumadin. Stroke (INR 1.5).  -Under good rate control with heart rates between 60 and 90 mostly. Continue with metoprolol and diltiazem. She takes split dosing of this medication. -She had a stroke, left MCA when she had to be off of anticoagulation secondary to gastrointestinal bleeding.   - Currently stable. Had bleeding of varicose vein, ER, pressure, stopped.    2. Coronary artery disease -Nonobstructive CAD, she had normal cardiac catheterization in 2013 in [place].   3. Pulmonary hypertension -She had been diagnosis with this previously in [place] and was getting echocardiograms every 6 months. Echocardiogram here showed normal pulmonary pressures. Reassurance.   4. Hyperlipidemia -LDL 52. Prior stroke. Deserves statin. On atorvastatin 10 mg once a day. She has been anti-statin in the past. She showed me a article from Dr. Talmage Coin regarding co Q 10 previously. She is now taking the atorvastatin alone with no red yeast rice. LDL in the 50s. Excellent.  Doing very well.  We will go ahead and check a lipid panel and ALT today.  5.Chronic anticoagulation   - now on Eliquis, used to be on Coumadin.  She is not having any  bleeding.  6. History of breast cancer -Prior mastectomy right-sided.  7. History of stroke -During breast cancer treatment after receiving Zometa as well as during a period of cessation of coumadin in the setting of GI bleeding. She is on chronic anticoagulation, now Eliquis.  No recurrence   8. Chronic lower extremity edema/venous insufficiency -Prior Lasix,  Now off with 3 gout attacks.  Encouraged thigh-high stockings. She does not like to wear these in the past. Stable chronic condition.  9. Carotid artery disease - Mild bilateral carotid stenosis.  Ultrasound checked 12/04/2017  10.  Cough-she was worried that this may be related to congestive heart failure.  Reassurance was given.  She does have Lasix 20 mg as needed.  I asked her to take this for the next 2 or 3 days to see if this helps with her congestion.  I doubt that this is CHF related. Current medicines are reviewed at length with the patient today.  The patient does not have concerns regarding medicines.  The following changes have been made:  no change  Labs/ tests ordered today include:   Orders Placed This Encounter  Procedures  . ALT  . Lipid panel     Disposition:     Signed, Candee Furbish, MD  05/29/2018 3:07 PM    [facility] [place], [place], [place]  [address] Phone: [phone]; Fax: [phone]

## 2018-05-29 NOTE — Patient Instructions (Signed)
Medication Instructions:  The current medical regimen is effective;  continue present plan and medications.  Labwork: Please have blood work today (Lipid, ALT)  Follow-Up: Follow up in 6 months with Truitt Merle, NP.  You will receive a letter in the mail 2 months before you are due.  Please call us when you receive this letter to schedule your follow up appointment.  Follow up in 1 year with Dr. Marlou Porch.  You will receive a letter in the mail 2 months before you are due.  Please call us when you receive this letter to schedule your follow up appointment.  If you need a refill on your cardiac medications before your next appointment, please call your pharmacy.  Thank you for choosing [facility]!!

## 2018-05-30 LAB — LIPID PANEL
CHOLESTEROL TOTAL: 114 mg/dL (ref 100–199)
Chol/HDL Ratio: 2.4 ratio (ref 0.0–4.4)
HDL: 48 mg/dL (ref >39)
LDL CALC: 41 mg/dL (ref 0–99)
Triglycerides: 126 mg/dL (ref 0–149)
VLDL CHOLESTEROL CAL: 25 mg/dL (ref 5–40)

## 2018-05-30 LAB — ALT: ALT: 12 IU/L (ref 0–32)

## 2018-06-01 ENCOUNTER — Encounter: Payer: Self-pay | Admitting: *Deleted

## 2018-06-04 ENCOUNTER — Telehealth: Payer: Self-pay | Admitting: Cardiology

## 2018-06-04 NOTE — Telephone Encounter (Signed)
New message   Patient is wanting to get her numbers on her lab results and a hard copy of the report. Patient states that she can pickup copy or mail it to the address on file.

## 2018-06-04 NOTE — Progress Notes (Signed)
GYNECOLOGY  VISIT   HPI: 84 y.o.   Married  Caucasian  female   [phone] with Patient's last menstrual period was 09/12/1973 (approximate).   here for   Pessary fitting   She took her own pessary out this am.   Here to receive a 3 inch (#6) gelhorn with a short stem.  She is currently using a #7 ring with support.   She is very worried about ovarian cancer. She is asking a blood test for ovarian cancer.  She had a pelvic US on 04/14/16 and had normal atrophic ovaries and some calcification in the myometrium, EMS normal.  No abdominal pain or changes in her bowel function.  She has some periodic vaginal bleeding and has chronic granulation tissue from her pessary use.  She has had a negative endometrial biopsy in the past.   Wears a pad for chronic urinary incontinence.   She is on Eliquis for atrial fibrillation and hx stroke.  GYNECOLOGIC HISTORY: Patient's last menstrual period was 09/12/1973 (approximate). Contraception:  Postmenopausal  Menopausal hormone therapy:  None Last mammogram:  11/15/17 Done in [place], [place] states she had breast MRI as weill was normal  Last pap smear:   11/25/15 neg         OB History    Gravida  3   Para  3   Term  3   Preterm      AB      Living  3     SAB      TAB      Ectopic      Multiple      Live Births                 Patient Active Problem List   Diagnosis Date Noted  . Diplopia 01/29/2016  . GERD (gastroesophageal reflux disease) 01/29/2016  . Cerebrovascular accident (CVA) due to embolism of left middle cerebral artery ([place]) 01/29/2016  . Persistent atrial fibrillation ([place])   . Bleeding gastrointestinal   . Gait disturbance, post-stroke 01/27/2016  . Fall   . Secondary hypertension, unspecified   . Cerebral infarction due to thrombosis of left middle cerebral artery (HCC) s/p mechanical thrombectomy   . Malnutrition of moderate degree 01/09/2016  . GI bleed 01/06/2016  . Chronic atrial fibrillation ([place])  06/10/2015  . Pulmonary hypertension, secondary ([place]) 06/10/2015  . Chronic anticoagulation 06/10/2015  . Hyperlipidemia 06/10/2015  . Essential hypertension 06/10/2015  . History of stroke 06/10/2015  . Coronary artery disease due to lipid rich plaque 06/10/2015  . Afib ([place])   . Elevated uric acid in blood   . Arthritis of ankle   . Pulmonary HTN ([place])     Past Medical History:  Diagnosis Date  . Afib ([place])   . Arthritis of ankle   . CAD (coronary artery disease)   . Cancer [facility]) 2012   breast cancer-right  . Compression fracture of T12 vertebra (HCC)   . Diverticulosis   . Edema   . Elevated uric acid in blood   . GI bleed 12/2015  . Hyperlipidemia   . Hypertension   . Long-term (current) use of anticoagulants   . Moderate mitral regurgitation   . Pulmonary HTN ([place])    Echo 1/19: EF 60-65, no RWMA, Ao sclerosis, trivial AI, mild MR, mod BAE, mod TR, PASP 49  . Stroke ([place]) 01/25/2016  . TIA (transient ischemic attack) 09/2011  . Urinary incontinence   . Urinary retention  Past Surgical History:  Procedure Laterality Date  . APPENDECTOMY    . BREAST SURGERY  2012   Rt.mastectomy--[place]  2013  . CATARACT EXTRACTION, BILATERAL    . DILATION AND CURETTAGE OF UTERUS     in her early 39's for DUB  . ESOPHAGOGASTRODUODENOSCOPY Left 01/08/2016   Procedure: ESOPHAGOGASTRODUODENOSCOPY (EGD);  Surgeon: Arta Silence, MD;  Location: [facility];  Service: Endoscopy;  Laterality: Left;  . GALLBLADDER SURGERY    . MASTECTOMY Right   . RADIOLOGY WITH ANESTHESIA N/A 01/25/2016   Procedure: RADIOLOGY WITH ANESTHESIA;  Surgeon: Luanne Bras, MD;  Location: [facility];  Service: Radiology;  Laterality: N/A;  . REPLACEMENT TOTAL KNEE BILATERAL    . TONSILLECTOMY AND ADENOIDECTOMY     as a child    Current Outpatient Medications  Medication Sig Dispense Refill  . acetaminophen (TYLENOL) 500 MG tablet Take 500-1,000 mg by mouth every 12  (twelve) hours as needed (pain).     Marland Kitchen apixaban (ELIQUIS) 5 MG TABS tablet Take 1 tablet (5 mg total) by mouth 2 (two) times daily. 180 tablet 1  . atorvastatin (LIPITOR) 10 MG tablet Take 1 tablet (10 mg total) by mouth daily. 90 tablet 2  . calcium-vitamin D 250-100 MG-UNIT tablet Take 2 tablets by mouth 2 (two) times daily.     . Cholecalciferol (VITAMIN D-3) 1000 units CAPS Take 1,000 Units by mouth daily.    Marland Kitchen diltiazem (CARDIZEM) 60 MG tablet Take 1 tablet (60 mg total) by mouth 3 (three) times daily. 270 tablet 3  . furosemide (LASIX) 20 MG tablet Take 20 mg by mouth daily as needed.    . metoprolol tartrate (LOPRESSOR) 100 MG tablet Take 1 tablet (100 mg total) by mouth 2 (two) times daily. 180 tablet 3  . Multiple Vitamins-Minerals (MULTIVITAMIN ADULT PO) Take 1 tablet by mouth daily.     Marland Kitchen omeprazole (PRILOSEC) 40 MG capsule Take 40 mg by mouth every other day.     . vitamin C (ASCORBIC ACID) 500 MG tablet Take 1,000 mg by mouth daily.      No current facility-administered medications for this visit.      ALLERGIES: Valium [diazepam]; Amiodarone hcl [amiodarone]; Compazine [prochlorperazine]; Other; Thorazine [chlorpromazine]; Zometa [zoledronic acid]; and Ciprofloxacin  Family History  Problem Relation Age of Onset  . Asthma Mother   . CVA Father   . Atrial fibrillation Sister        HAS PACER  . Pneumonia Brother   . Cancer Daughter        COLON CANCER  . Cancer Maternal Grandmother        breast cancer    Social History   Socioeconomic History  . Marital status: Married    Spouse name: Not on file  . Number of children: Not on file  . Years of education: Not on file  . Highest education level: Not on file  Occupational History  . Not on file  Social Needs  . Financial resource strain: Not on file  . Food insecurity:    Worry: Not on file    Inability: Not on file  . Transportation needs:    Medical: Not on file    Non-medical: Not on file  Tobacco Use  .  Smoking status: Former Smoker    Types: Cigarettes  . Smokeless tobacco: Never Used  Substance and Sexual Activity  . Alcohol use: No    Alcohol/week: 0.0 standard drinks  . Drug use: No  .  Sexual activity: Yes    Partners: Male    Birth control/protection: Post-menopausal  Lifestyle  . Physical activity:    Days per week: Not on file    Minutes per session: Not on file  . Stress: Not on file  Relationships  . Social connections:    Talks on phone: Not on file    Gets together: Not on file    Attends religious service: Not on file    Active member of club or organization: Not on file    Attends meetings of clubs or organizations: Not on file    Relationship status: Not on file  . Intimate partner violence:    Fear of current or ex partner: Not on file    Emotionally abused: Not on file    Physically abused: Not on file    Forced sexual activity: Not on file  Other Topics Concern  . Not on file  Social History Narrative  . Not on file    Review of Systems  Genitourinary:       Vulvar itching Loss of urine     PHYSICAL EXAMINATION:    BP 122/74   Pulse 66   Resp 14   Ht 5' (1.524 m)   Wt 148 lb (67.1 kg)   LMP 09/12/1973 (Approximate)   BMI 28.90 kg/m     General appearance: alert, cooperative and appears stated age   Pelvic: External genitalia:  no lesions              Urethra:  normal appearing urethra with no masses, tenderness or lesions              Bartholins and Skenes: normal                 Vagina: normal appearing vagina with normal color and discharge, polypoid change of bilateral vaginal walls at apex.               Cervix: no lesions                Bimanual Exam:  Uterus:  normal size, contour, position, consistency, mobility, non-tender              Adnexa: no mass, fullness, tenderness               Gelhorn 3 inch pessary with short stem placed.   Chaperone was present for exam.  ASSESSMENT  Hx breast cancer in her 91s. Normal pelvic US  2 years ago.  Complete uterovaginal prolapse. Chronic inflammation from pessary use.   PLAN  Gelhorn pessary lot number [id number] to patient.  She will return in 1 week for a pessary check.  She will call the office by mid afternoon if she is unable to void after she leaves the office.    An After Visit Summary was printed and given to the patient.  __15____ minutes face to face time of which over 50% was spent in counseling.

## 2018-06-04 NOTE — Telephone Encounter (Signed)
Patient was requesting her lab results.  I told her that we mailed them to her, but I still shared the values.  She was very Patent attorney.

## 2018-06-06 ENCOUNTER — Ambulatory Visit (INDEPENDENT_AMBULATORY_CARE_PROVIDER_SITE_OTHER): Payer: Medicare Other | Admitting: Obstetrics and Gynecology

## 2018-06-06 ENCOUNTER — Ambulatory Visit: Payer: Self-pay | Admitting: Obstetrics and Gynecology

## 2018-06-06 ENCOUNTER — Encounter: Payer: Self-pay | Admitting: Obstetrics and Gynecology

## 2018-06-06 ENCOUNTER — Telehealth: Payer: Self-pay | Admitting: Obstetrics and Gynecology

## 2018-06-06 VITALS — BP 122/74 | HR 66 | Resp 14 | Ht 60.0 in | Wt 148.0 lb

## 2018-06-06 DIAGNOSIS — Z4689 Encounter for fitting and adjustment of other specified devices: Secondary | ICD-10-CM

## 2018-06-06 DIAGNOSIS — N819 Female genital prolapse, unspecified: Secondary | ICD-10-CM | POA: Diagnosis not present

## 2018-06-06 DIAGNOSIS — M549 Dorsalgia, unspecified: Secondary | ICD-10-CM

## 2018-06-06 DIAGNOSIS — N813 Complete uterovaginal prolapse: Secondary | ICD-10-CM

## 2018-06-06 DIAGNOSIS — R32 Unspecified urinary incontinence: Secondary | ICD-10-CM | POA: Diagnosis not present

## 2018-06-06 DIAGNOSIS — I6523 Occlusion and stenosis of bilateral carotid arteries: Secondary | ICD-10-CM

## 2018-06-06 NOTE — Progress Notes (Signed)
GYNECOLOGY  VISIT   HPI: 83 y.o.   Married  Caucasian  female   [phone] with Patient's last menstrual period was 09/12/1973 (approximate).   here for   Pessary difficulty. Patient states that she felt alright with new pessary until she had a bowel movement. Now she feels like she has pain and pressure in her lower back. Patient states that she was able to void 1.5 times. She has been unable to void here in the office   Voided at 1:30.  Has also had small voids.  Back pain started with a BM.   Has recurrent UTIs and urinary incontinence.  GYNECOLOGIC HISTORY: Patient's last menstrual period was 09/12/1973 (approximate). Contraception: Postmenopausal  Menopausal hormone therapy:  none Last mammogram:   11/15/17 Done in [place], [place] states she had breast MRI as weill was normal  Last pap smear:   11/25/15 neg          OB History    Gravida  3   Para  3   Term  3   Preterm      AB      Living  3     SAB      TAB      Ectopic      Multiple      Live Births                 Patient Active Problem List   Diagnosis Date Noted  . Diplopia 01/29/2016  . GERD (gastroesophageal reflux disease) 01/29/2016  . Cerebrovascular accident (CVA) due to embolism of left middle cerebral artery ([place]) 01/29/2016  . Persistent atrial fibrillation ([place])   . Bleeding gastrointestinal   . Gait disturbance, post-stroke 01/27/2016  . Fall   . Secondary hypertension, unspecified   . Cerebral infarction due to thrombosis of left middle cerebral artery (HCC) s/p mechanical thrombectomy   . Malnutrition of moderate degree 01/09/2016  . GI bleed 01/06/2016  . Chronic atrial fibrillation ([place]) 06/10/2015  . Pulmonary hypertension, secondary ([place]) 06/10/2015  . Chronic anticoagulation 06/10/2015  . Hyperlipidemia 06/10/2015  . Essential hypertension 06/10/2015  . History of stroke 06/10/2015  . Coronary artery disease due to lipid rich plaque 06/10/2015  . Afib ([place])   . Elevated uric  acid in blood   . Arthritis of ankle   . Pulmonary HTN ([place])     Past Medical History:  Diagnosis Date  . Afib ([place])   . Arthritis of ankle   . CAD (coronary artery disease)   . Cancer [facility]) 2012   breast cancer-right  . Compression fracture of T12 vertebra (HCC)   . Diverticulosis   . Edema   . Elevated uric acid in blood   . GI bleed 12/2015  . Hyperlipidemia   . Hypertension   . Long-term (current) use of anticoagulants   . Moderate mitral regurgitation   . Pulmonary HTN ([place])    Echo 1/19: EF 60-65, no RWMA, Ao sclerosis, trivial AI, mild MR, mod BAE, mod TR, PASP 49  . Stroke ([place]) 01/25/2016  . TIA (transient ischemic attack) 09/2011  . Urinary incontinence   . Urinary retention     Past Surgical History:  Procedure Laterality Date  . APPENDECTOMY    . BREAST SURGERY  2012   Rt.mastectomy--[place]  2013  . CATARACT EXTRACTION, BILATERAL    . DILATION AND CURETTAGE OF UTERUS     in her early 20's for DUB  . ESOPHAGOGASTRODUODENOSCOPY Left  01/08/2016   Procedure: ESOPHAGOGASTRODUODENOSCOPY (EGD);  Surgeon: Arta Silence, MD;  Location: [facility];  Service: Endoscopy;  Laterality: Left;  . GALLBLADDER SURGERY    . MASTECTOMY Right   . RADIOLOGY WITH ANESTHESIA N/A 01/25/2016   Procedure: RADIOLOGY WITH ANESTHESIA;  Surgeon: Luanne Bras, MD;  Location: [facility];  Service: Radiology;  Laterality: N/A;  . REPLACEMENT TOTAL KNEE BILATERAL    . TONSILLECTOMY AND ADENOIDECTOMY     as a child    Current Outpatient Medications  Medication Sig Dispense Refill  . acetaminophen (TYLENOL) 500 MG tablet Take 500-1,000 mg by mouth every 12 (twelve) hours as needed (pain).     Marland Kitchen apixaban (ELIQUIS) 5 MG TABS tablet Take 1 tablet (5 mg total) by mouth 2 (two) times daily. 180 tablet 1  . atorvastatin (LIPITOR) 10 MG tablet Take 1 tablet (10 mg total) by mouth daily. 90 tablet 2  . calcium-vitamin D 250-100 MG-UNIT tablet Take 2 tablets by  mouth 2 (two) times daily.     . Cholecalciferol (VITAMIN D-3) 1000 units CAPS Take 1,000 Units by mouth daily.    Marland Kitchen diltiazem (CARDIZEM) 60 MG tablet Take 1 tablet (60 mg total) by mouth 3 (three) times daily. 270 tablet 3  . furosemide (LASIX) 20 MG tablet Take 20 mg by mouth daily as needed.    . metoprolol tartrate (LOPRESSOR) 100 MG tablet Take 1 tablet (100 mg total) by mouth 2 (two) times daily. 180 tablet 3  . Multiple Vitamins-Minerals (MULTIVITAMIN ADULT PO) Take 1 tablet by mouth daily.     Marland Kitchen omeprazole (PRILOSEC) 40 MG capsule Take 40 mg by mouth every other day.     . vitamin C (ASCORBIC ACID) 500 MG tablet Take 1,000 mg by mouth daily.      No current facility-administered medications for this visit.      ALLERGIES: Valium [diazepam]; Amiodarone hcl [amiodarone]; Compazine [prochlorperazine]; Other; Thorazine [chlorpromazine]; Zometa [zoledronic acid]; and Ciprofloxacin  Family History  Problem Relation Age of Onset  . Asthma Mother   . CVA Father   . Atrial fibrillation Sister        HAS PACER  . Pneumonia Brother   . Cancer Daughter        COLON CANCER  . Cancer Maternal Grandmother        breast cancer    Social History   Socioeconomic History  . Marital status: Married    Spouse name: Not on file  . Number of children: Not on file  . Years of education: Not on file  . Highest education level: Not on file  Occupational History  . Not on file  Social Needs  . Financial resource strain: Not on file  . Food insecurity:    Worry: Not on file    Inability: Not on file  . Transportation needs:    Medical: Not on file    Non-medical: Not on file  Tobacco Use  . Smoking status: Former Smoker    Types: Cigarettes  . Smokeless tobacco: Never Used  Substance and Sexual Activity  . Alcohol use: No    Alcohol/week: 0.0 standard drinks  . Drug use: No  . Sexual activity: Yes    Partners: Male    Birth control/protection: Post-menopausal  Lifestyle  .  Physical activity:    Days per week: Not on file    Minutes per session: Not on file  . Stress: Not on file  Relationships  . Social connections:    Talks  on phone: Not on file    Gets together: Not on file    Attends religious service: Not on file    Active member of club or organization: Not on file    Attends meetings of clubs or organizations: Not on file    Relationship status: Not on file  . Intimate partner violence:    Fear of current or ex partner: Not on file    Emotionally abused: Not on file    Physically abused: Not on file    Forced sexual activity: Not on file  Other Topics Concern  . Not on file  Social History Narrative  . Not on file    Review of Systems  All other systems reviewed and are negative.   PHYSICAL EXAMINATION:    LMP 09/12/1973 (Approximate)     General appearance: alert, cooperative and appears stated age   Pelvic: External genitalia:  no lesions              Urethra:  normal appearing urethra with no masses, tenderness or lesions                Bladder catheterization performed after sterile prep with betadine.  100 cc obtained.  Gelhorn pessary removed, cleansed and given to patient in a bio bag.  Her #7 ring with support replaced back in her vagina.  Chaperone was present for exam.  ASSESSMENT  Complete uterovaginal prolapse. Back pain following bowel movement.  I think it is possible the new pessary rotated int he vagina.  Pessary maintenance. Hx recurrent UTIs. Urinary incontinence.   PLAN  Urine micro and culture.  Continue with pessary #7 ring with support.  FU prn if desires to try Gelhorn again - either pessary she received today or a larger one.    An After Visit Summary was printed and given to the patient.  __15____ minutes face to face time of which over 50% was spent in counseling.

## 2018-06-06 NOTE — Telephone Encounter (Signed)
Spoke with patient, instructed patient to return to office now and bring her old pessary with her. Patient verbalizes understanding and is agreeable.  Encounter closed.

## 2018-06-06 NOTE — Telephone Encounter (Signed)
Patient called with concerns about her pessary feeling like it is falling out. She was here for a fitting this morning.

## 2018-06-07 LAB — URINALYSIS, MICROSCOPIC ONLY
CASTS: NONE SEEN /LPF
EPITHELIAL CELLS (NON RENAL): NONE SEEN /HPF (ref 0–10)
WBC UA: NONE SEEN /HPF (ref 0–5)

## 2018-06-07 LAB — URINE CULTURE: ORGANISM ID, BACTERIA: NO GROWTH

## 2018-06-09 ENCOUNTER — Encounter: Payer: Self-pay | Admitting: Obstetrics and Gynecology

## 2018-06-11 NOTE — Progress Notes (Signed)
GYNECOLOGY  VISIT   HPI: 84 y.o.   Married  Caucasian  female   [phone] with Patient's last menstrual period was 09/12/1973 (approximate).   here for pessary placement.   Tried her new Gelhorn pessary a few days ago and developed back pain with a BM.  Returned to the office and had this pessary removed and had a PVR check which was normal.  Her UC was normal.  Patient was really worried about urinary retention at that time.   She wants to try the Gelhorn again. She has already taken her ring with support out.   State she had some spotting again which she has done periodically from vaginal ulceration.  She is on Eliquis.  She has had a negative work up for postmenopausal bleeding.   GYNECOLOGIC HISTORY: Patient's last menstrual period was 09/12/1973 (approximate). Contraception:  posmenopasual Menopausal hormone therapy:  none Last mammogram:  11/2017 Last pap smear:   11/2015 normal        OB History    Gravida  3   Para  3   Term  3   Preterm      AB      Living  3     SAB      TAB      Ectopic      Multiple      Live Births                 Patient Active Problem List   Diagnosis Date Noted  . Diplopia 01/29/2016  . GERD (gastroesophageal reflux disease) 01/29/2016  . Cerebrovascular accident (CVA) due to embolism of left middle cerebral artery ([place]) 01/29/2016  . Persistent atrial fibrillation   . Bleeding gastrointestinal   . Gait disturbance, post-stroke 01/27/2016  . Fall   . Secondary hypertension, unspecified   . Cerebral infarction due to thrombosis of left middle cerebral artery (HCC) s/p mechanical thrombectomy   . Malnutrition of moderate degree 01/09/2016  . GI bleed 01/06/2016  . Chronic atrial fibrillation ([place]) 06/10/2015  . Pulmonary hypertension, secondary ([place]) 06/10/2015  . Chronic anticoagulation 06/10/2015  . Hyperlipidemia 06/10/2015  . Essential hypertension 06/10/2015  . History of stroke 06/10/2015  . Coronary artery  disease due to lipid rich plaque 06/10/2015  . Afib ([place])   . Elevated uric acid in blood   . Arthritis of ankle   . Pulmonary HTN ([place])     Past Medical History:  Diagnosis Date  . Afib ([place])   . Arthritis of ankle   . CAD (coronary artery disease)   . Cancer [facility]) 2012   breast cancer-right  . Compression fracture of T12 vertebra (HCC)   . Diverticulosis   . Edema   . Elevated uric acid in blood   . GI bleed 12/2015  . Hyperlipidemia   . Hypertension   . Long-term (current) use of anticoagulants   . Moderate mitral regurgitation   . Pulmonary HTN ([place])    Echo 1/19: EF 60-65, no RWMA, Ao sclerosis, trivial AI, mild MR, mod BAE, mod TR, PASP 49  . Stroke ([place]) 01/25/2016  . TIA (transient ischemic attack) 09/2011  . Urinary incontinence   . Urinary retention     Past Surgical History:  Procedure Laterality Date  . APPENDECTOMY    . BREAST SURGERY  2012   Rt.mastectomy--[place]  2013  . CATARACT EXTRACTION, BILATERAL    . DILATION AND CURETTAGE OF UTERUS     in  her early 30's for DUB  . ESOPHAGOGASTRODUODENOSCOPY Left 01/08/2016   Procedure: ESOPHAGOGASTRODUODENOSCOPY (EGD);  Surgeon: Arta Silence, MD;  Location: [facility];  Service: Endoscopy;  Laterality: Left;  . GALLBLADDER SURGERY    . MASTECTOMY Right   . RADIOLOGY WITH ANESTHESIA N/A 01/25/2016   Procedure: RADIOLOGY WITH ANESTHESIA;  Surgeon: Luanne Bras, MD;  Location: [facility];  Service: Radiology;  Laterality: N/A;  . REPLACEMENT TOTAL KNEE BILATERAL    . TONSILLECTOMY AND ADENOIDECTOMY     as a child    Current Outpatient Medications  Medication Sig Dispense Refill  . acetaminophen (TYLENOL) 500 MG tablet Take 500-1,000 mg by mouth every 12 (twelve) hours as needed (pain).     Marland Kitchen apixaban (ELIQUIS) 5 MG TABS tablet Take 1 tablet (5 mg total) by mouth 2 (two) times daily. 180 tablet 1  . atorvastatin (LIPITOR) 10 MG tablet Take 1 tablet (10 mg total) by mouth  daily. 90 tablet 2  . calcium-vitamin D 250-100 MG-UNIT tablet Take 2 tablets by mouth 2 (two) times daily.     . Cholecalciferol (VITAMIN D-3) 1000 units CAPS Take 1,000 Units by mouth daily.    Marland Kitchen diltiazem (CARDIZEM) 60 MG tablet Take 1 tablet (60 mg total) by mouth 3 (three) times daily. 270 tablet 3  . furosemide (LASIX) 20 MG tablet Take 20 mg by mouth daily as needed.    . metoprolol tartrate (LOPRESSOR) 100 MG tablet Take 1 tablet (100 mg total) by mouth 2 (two) times daily. 180 tablet 3  . Multiple Vitamins-Minerals (MULTIVITAMIN ADULT PO) Take 1 tablet by mouth daily.     Marland Kitchen omeprazole (PRILOSEC) 40 MG capsule Take 40 mg by mouth every other day.     . vitamin C (ASCORBIC ACID) 500 MG tablet Take 1,000 mg by mouth daily.      No current facility-administered medications for this visit.      ALLERGIES: Valium [diazepam]; Amiodarone hcl [amiodarone]; Compazine [prochlorperazine]; Other; Thorazine [chlorpromazine]; Zometa [zoledronic acid]; and Ciprofloxacin  Family History  Problem Relation Age of Onset  . Asthma Mother   . CVA Father   . Atrial fibrillation Sister        HAS PACER  . Pneumonia Brother   . Cancer Daughter        COLON CANCER  . Cancer Maternal Grandmother        breast cancer    Social History   Socioeconomic History  . Marital status: Married    Spouse name: Not on file  . Number of children: Not on file  . Years of education: Not on file  . Highest education level: Not on file  Occupational History  . Not on file  Social Needs  . Financial resource strain: Not on file  . Food insecurity:    Worry: Not on file    Inability: Not on file  . Transportation needs:    Medical: Not on file    Non-medical: Not on file  Tobacco Use  . Smoking status: Former Smoker    Types: Cigarettes  . Smokeless tobacco: Never Used  Substance and Sexual Activity  . Alcohol use: No    Alcohol/week: 0.0 standard drinks  . Drug use: No  . Sexual activity: Yes     Partners: Male    Birth control/protection: Post-menopausal  Lifestyle  . Physical activity:    Days per week: Not on file    Minutes per session: Not on file  . Stress: Not on file  Relationships  . Social connections:    Talks on phone: Not on file    Gets together: Not on file    Attends religious service: Not on file    Active member of club or organization: Not on file    Attends meetings of clubs or organizations: Not on file    Relationship status: Not on file  . Intimate partner violence:    Fear of current or ex partner: Not on file    Emotionally abused: Not on file    Physically abused: Not on file    Forced sexual activity: Not on file  Other Topics Concern  . Not on file  Social History Narrative  . Not on file    Review of Systems  Constitutional: Negative.   HENT: Negative.   Eyes: Negative.   Respiratory: Negative.   Cardiovascular: Negative.   Gastrointestinal: Negative.   Endocrine: Negative.   Genitourinary: Positive for vaginal bleeding.       Spontaneous loss of urine nocturia  Musculoskeletal: Negative.   Skin: Negative.   Allergic/Immunologic: Negative.   Neurological: Negative.   Hematological: Negative.   Psychiatric/Behavioral: Negative.   All other systems reviewed and are negative.   PHYSICAL EXAMINATION:    BP 118/68   Pulse (!) 59   Ht 5' (1.524 m)   Wt 151 lb (68.5 kg)   LMP 09/12/1973 (Approximate)   BMI 29.49 kg/m     General appearance: alert, cooperative and appears stated age   ASSESSMENT  Complete uterovaginal prolapse.  PLAN  Gelhorn pessary placed.  We talked about normal voiding intervals of every 3 hours.  If no void in 6 hours or if develops pain, return to the office today. FU in 7 - 10 days.    An After Visit Summary was printed and given to the patient.  __15____ minutes face to face time of which over 50% was spent in counseling.

## 2018-06-13 ENCOUNTER — Telehealth: Payer: Self-pay | Admitting: Obstetrics and Gynecology

## 2018-06-13 ENCOUNTER — Ambulatory Visit (INDEPENDENT_AMBULATORY_CARE_PROVIDER_SITE_OTHER): Payer: Medicare Other | Admitting: Obstetrics and Gynecology

## 2018-06-13 ENCOUNTER — Encounter: Payer: Self-pay | Admitting: Obstetrics and Gynecology

## 2018-06-13 ENCOUNTER — Other Ambulatory Visit: Payer: Self-pay

## 2018-06-13 ENCOUNTER — Ambulatory Visit: Payer: Medicare Other | Admitting: Obstetrics and Gynecology

## 2018-06-13 VITALS — BP 118/68 | HR 59 | Ht 60.0 in | Wt 151.0 lb

## 2018-06-13 DIAGNOSIS — N813 Complete uterovaginal prolapse: Secondary | ICD-10-CM

## 2018-06-13 DIAGNOSIS — I6523 Occlusion and stenosis of bilateral carotid arteries: Secondary | ICD-10-CM | POA: Diagnosis not present

## 2018-06-13 NOTE — Telephone Encounter (Signed)
Patient was seen this morning and feels like her pessary is coming out. Patient is asking if she needs to return to office ? Please call husbands cell to reach patient [phone]

## 2018-06-13 NOTE — Telephone Encounter (Signed)
Call to patient. Patient states she just wanted to call and give Dr. Quincy Simmonds an update that she tried to take the pessary out and insert her old one as instructed this am, but she was unable to do so. She states the pessary has since "moved into place" and she is able to urinate normally. Patient asking to move follow up appointment back to Wednesday as it was originally scheduled since the pessary is now in place. One week recheck rescheduled to Wednesday 06-20-18 at 1045. Patient agreeable to date and time of appointment.   Routing to provider and will close encounter.

## 2018-06-13 NOTE — Telephone Encounter (Signed)
Patient is asking to speak with Sharee Pimple regarding a message for Dr Quincy Simmonds. Please call husband's cell [phone].

## 2018-06-13 NOTE — Telephone Encounter (Signed)
Reviewed with Dr. Quincy Simmonds. Call returned to patient. Patient states pessary has not completely come out, but feels like it is. Patient will remove pessary and place her own pessary for now. OV scheduled for 10/8 at 11am with Dr. Quincy Simmonds for pessary fitting. Instructed patient to not discard pessary. Patient verbalizes understanding.   Routing to provider for final review. Patient is agreeable to disposition. Will close encounter.

## 2018-06-19 ENCOUNTER — Ambulatory Visit: Payer: Self-pay | Admitting: Obstetrics and Gynecology

## 2018-06-20 ENCOUNTER — Ambulatory Visit (INDEPENDENT_AMBULATORY_CARE_PROVIDER_SITE_OTHER): Payer: Medicare Other | Admitting: Obstetrics and Gynecology

## 2018-06-20 ENCOUNTER — Encounter: Payer: Self-pay | Admitting: Obstetrics and Gynecology

## 2018-06-20 ENCOUNTER — Ambulatory Visit: Payer: Medicare Other | Admitting: Obstetrics and Gynecology

## 2018-06-20 ENCOUNTER — Other Ambulatory Visit: Payer: Self-pay

## 2018-06-20 VITALS — BP 124/70 | HR 56 | Ht 62.0 in | Wt 151.0 lb

## 2018-06-20 DIAGNOSIS — I6523 Occlusion and stenosis of bilateral carotid arteries: Secondary | ICD-10-CM

## 2018-06-20 DIAGNOSIS — N813 Complete uterovaginal prolapse: Secondary | ICD-10-CM

## 2018-06-20 DIAGNOSIS — Z4689 Encounter for fitting and adjustment of other specified devices: Secondary | ICD-10-CM

## 2018-06-20 DIAGNOSIS — N765 Ulceration of vagina: Secondary | ICD-10-CM | POA: Diagnosis not present

## 2018-06-20 NOTE — Progress Notes (Signed)
GYNECOLOGY  VISIT   HPI: 84 y.o.   Married  Caucasian  female   [phone] with Patient's last menstrual period was 09/12/1973 (approximate).   here for pessary check.    Gelhorn pessary placed on 06/13/18.  "I am so excited because I had a normal week."  Some difficulty with BM, on occasion, but no more than with the other pessary.   Feels like she is emptying her bladder more with this new pessary.   Can have some urinary leakage and she wears a pad usually.   Not able to remove the pessary.   She prefers this pessary better than the #7 ring with support.   Does have some vaginal spotting, the same as usual.   GYNECOLOGIC HISTORY: Patient's last menstrual period was 09/12/1973 (approximate). Contraception: Postmenopausal Menopausal hormone therapy:  none Last mammogram: 11-15-17 Neg breast MRI  Last pap smear:  11/2015 normal        OB History    Gravida  3   Para  3   Term  3   Preterm      AB      Living  3     SAB      TAB      Ectopic      Multiple      Live Births                 Patient Active Problem List   Diagnosis Date Noted  . Diplopia 01/29/2016  . GERD (gastroesophageal reflux disease) 01/29/2016  . Cerebrovascular accident (CVA) due to embolism of left middle cerebral artery ([place]) 01/29/2016  . Persistent atrial fibrillation   . Bleeding gastrointestinal   . Gait disturbance, post-stroke 01/27/2016  . Fall   . Secondary hypertension, unspecified   . Cerebral infarction due to thrombosis of left middle cerebral artery (HCC) s/p mechanical thrombectomy   . Malnutrition of moderate degree 01/09/2016  . GI bleed 01/06/2016  . Chronic atrial fibrillation ([place]) 06/10/2015  . Pulmonary hypertension, secondary ([place]) 06/10/2015  . Chronic anticoagulation 06/10/2015  . Hyperlipidemia 06/10/2015  . Essential hypertension 06/10/2015  . History of stroke 06/10/2015  . Coronary artery disease due to lipid rich plaque 06/10/2015  . Afib ([place])    . Elevated uric acid in blood   . Arthritis of ankle   . Pulmonary HTN ([place])     Past Medical History:  Diagnosis Date  . Afib ([place])   . Arthritis of ankle   . CAD (coronary artery disease)   . Cancer [name]) 2012   breast cancer-right  . Compression fracture of T12 vertebra (HCC)   . Diverticulosis   . Edema   . Elevated uric acid in blood   . GI bleed 12/2015  . Hyperlipidemia   . Hypertension   . Long-term (current) use of anticoagulants   . Moderate mitral regurgitation   . Pulmonary HTN ([place])    Echo 1/19: EF 60-65, no RWMA, Ao sclerosis, trivial AI, mild MR, mod BAE, mod TR, PASP 49  . Stroke ([place]) 01/25/2016  . TIA (transient ischemic attack) 09/2011  . Urinary incontinence   . Urinary retention     Past Surgical History:  Procedure Laterality Date  . APPENDECTOMY    . BREAST SURGERY  2012   Rt.mastectomy--[place], [place]  2013  . CATARACT EXTRACTION, BILATERAL    . DILATION AND CURETTAGE OF UTERUS     in her early 7's for DUB  . ESOPHAGOGASTRODUODENOSCOPY  Left 01/08/2016   Procedure: ESOPHAGOGASTRODUODENOSCOPY (EGD);  Surgeon: Arta Silence, MD;  Location: [facility];  Service: Endoscopy;  Laterality: Left;  . GALLBLADDER SURGERY    . MASTECTOMY Right   . RADIOLOGY WITH ANESTHESIA N/A 01/25/2016   Procedure: RADIOLOGY WITH ANESTHESIA;  Surgeon: Luanne Bras, MD;  Location: [facility];  Service: Radiology;  Laterality: N/A;  . REPLACEMENT TOTAL KNEE BILATERAL    . TONSILLECTOMY AND ADENOIDECTOMY     as a child    Current Outpatient Medications  Medication Sig Dispense Refill  . acetaminophen (TYLENOL) 500 MG tablet Take 500-1,000 mg by mouth every 12 (twelve) hours as needed (pain).     Marland Kitchen amoxicillin (AMOXIL) 500 MG capsule Only takes as preventative prior to dentist    . apixaban (ELIQUIS) 5 MG TABS tablet Take 1 tablet (5 mg total) by mouth 2 (two) times daily. 180 tablet 1  . atorvastatin (LIPITOR) 10 MG tablet Take 1 tablet  (10 mg total) by mouth daily. 90 tablet 2  . calcium-vitamin D 250-100 MG-UNIT tablet Take 2 tablets by mouth 2 (two) times daily.     . Cholecalciferol (VITAMIN D-3) 1000 units CAPS Take 1,000 Units by mouth daily.    Marland Kitchen diltiazem (CARDIZEM) 60 MG tablet Take 1 tablet (60 mg total) by mouth 3 (three) times daily. 270 tablet 3  . furosemide (LASIX) 20 MG tablet Take 20 mg by mouth daily as needed.    . metoprolol tartrate (LOPRESSOR) 100 MG tablet Take 1 tablet (100 mg total) by mouth 2 (two) times daily. 180 tablet 3  . Multiple Vitamins-Minerals (MULTIVITAMIN ADULT PO) Take 1 tablet by mouth daily.     Marland Kitchen omeprazole (PRILOSEC) 40 MG capsule Take 40 mg by mouth every other day.     . vitamin C (ASCORBIC ACID) 500 MG tablet Take 1,000 mg by mouth daily.      No current facility-administered medications for this visit.      ALLERGIES: Valium [diazepam]; Amiodarone hcl [amiodarone]; Compazine [prochlorperazine]; Other; Thorazine [chlorpromazine]; Zometa [zoledronic acid]; and Ciprofloxacin  Family History  Problem Relation Age of Onset  . Asthma Mother   . CVA Father   . Atrial fibrillation Sister        HAS PACER  . Pneumonia Brother   . Cancer Daughter        COLON CANCER  . Cancer Maternal Grandmother        breast cancer    Social History   Socioeconomic History  . Marital status: Married    Spouse name: Not on file  . Number of children: Not on file  . Years of education: Not on file  . Highest education level: Not on file  Occupational History  . Not on file  Social Needs  . Financial resource strain: Not on file  . Food insecurity:    Worry: Not on file    Inability: Not on file  . Transportation needs:    Medical: Not on file    Non-medical: Not on file  Tobacco Use  . Smoking status: Former Smoker    Types: Cigarettes  . Smokeless tobacco: Never Used  Substance and Sexual Activity  . Alcohol use: No    Alcohol/week: 0.0 standard drinks  . Drug use: No  .  Sexual activity: Yes    Partners: Male    Birth control/protection: Post-menopausal  Lifestyle  . Physical activity:    Days per week: Not on file    Minutes per session: Not on  file  . Stress: Not on file  Relationships  . Social connections:    Talks on phone: Not on file    Gets together: Not on file    Attends religious service: Not on file    Active member of club or organization: Not on file    Attends meetings of clubs or organizations: Not on file    Relationship status: Not on file  . Intimate partner violence:    Fear of current or ex partner: Not on file    Emotionally abused: Not on file    Physically abused: Not on file    Forced sexual activity: Not on file  Other Topics Concern  . Not on file  Social History Narrative  . Not on file    Review of Systems  Constitutional: Negative.   HENT: Negative.   Eyes: Negative.   Respiratory: Negative.   Cardiovascular: Negative.   Gastrointestinal: Negative.   Endocrine: Negative.   Genitourinary: Negative.   Musculoskeletal: Negative.   Skin: Negative.   Allergic/Immunologic: Negative.   Neurological: Negative.   Hematological: Negative.   Psychiatric/Behavioral: Negative.     PHYSICAL EXAMINATION:    BP 124/70 (BP Location: Right Arm, Patient Position: Sitting, Cuff Size: Large)   Pulse (!) 56   Ht 5\' 2"  (1.575 m)   Wt 151 lb (68.5 kg)   LMP 09/12/1973 (Approximate)   BMI 27.62 kg/m     General appearance: alert, cooperative and appears stated age   Pelvic: External genitalia:  no lesions              Urethra:  normal appearing urethra with no masses, tenderness or lesions              Bartholins and Skenes: normal                 Vagina:  4 mm ulceration of posterior vaginal wall where the Gelhorn protrudes downward.   Complete uterovaginal prolapse.              Cervix: no lesions                Bimanual Exam:  Uterus:  normal size, contour, position, consistency, mobility, non-tender.                 Adnexa: no mass, fullness, tenderness              Rectal exam: Yes.  .  Confirms.  Rectum intact.               Anus:  normal sphincter tone, no lesions  Pessary removed, cleansed, and given to patient in a biobag. Chaperone was present for exam.  ASSESSMENT  Complete uterovaginal prolapse.  Posterior vaginal wall ulceration with Gelhorn use.   PLAN  Will stop using Gelhorn pessary.  Patient's #7 ring with support placed.  FU in 1 week, sooner as needed.     An After Visit Summary was printed and given to the patient.  __15____ minutes face to face time of which over 50% was spent in counseling.

## 2018-06-26 DIAGNOSIS — Z23 Encounter for immunization: Secondary | ICD-10-CM | POA: Diagnosis not present

## 2018-06-27 ENCOUNTER — Encounter: Payer: Self-pay | Admitting: Obstetrics and Gynecology

## 2018-06-27 ENCOUNTER — Other Ambulatory Visit: Payer: Self-pay

## 2018-06-27 ENCOUNTER — Ambulatory Visit (INDEPENDENT_AMBULATORY_CARE_PROVIDER_SITE_OTHER): Payer: Medicare Other | Admitting: Obstetrics and Gynecology

## 2018-06-27 VITALS — BP 150/82 | HR 76 | Ht 62.0 in | Wt 151.0 lb

## 2018-06-27 DIAGNOSIS — N812 Incomplete uterovaginal prolapse: Secondary | ICD-10-CM

## 2018-06-27 DIAGNOSIS — I6523 Occlusion and stenosis of bilateral carotid arteries: Secondary | ICD-10-CM | POA: Diagnosis not present

## 2018-06-27 NOTE — Progress Notes (Signed)
GYNECOLOGY  VISIT   HPI: 84 y.o.   Married  Caucasian  female   [phone] with Patient's last menstrual period was 09/12/1973 (approximate).   here for follow up on pessary.  Occasional vaginal spotting.   Able to take the #7 pessary with support out, but not able to place it back.   No problems with urinary retention.   GYNECOLOGIC HISTORY: Patient's last menstrual period was 09/12/1973 (approximate). Contraception:  Postmenopausal Menopausal hormone therapy:  none Last mammogram:  11-15-17 Neg breast MRI   Last pap smear: 11/2015 normal        OB History    Gravida  3   Para  3   Term  3   Preterm      AB      Living  3     SAB      TAB      Ectopic      Multiple      Live Births                 Patient Active Problem List   Diagnosis Date Noted  . Diplopia 01/29/2016  . GERD (gastroesophageal reflux disease) 01/29/2016  . Cerebrovascular accident (CVA) due to embolism of left middle cerebral artery ([place]) 01/29/2016  . Persistent atrial fibrillation   . Bleeding gastrointestinal   . Gait disturbance, post-stroke 01/27/2016  . Fall   . Secondary hypertension, unspecified   . Cerebral infarction due to thrombosis of left middle cerebral artery (HCC) s/p mechanical thrombectomy   . Malnutrition of moderate degree 01/09/2016  . GI bleed 01/06/2016  . Chronic atrial fibrillation ([place]) 06/10/2015  . Pulmonary hypertension, secondary ([place]) 06/10/2015  . Chronic anticoagulation 06/10/2015  . Hyperlipidemia 06/10/2015  . Essential hypertension 06/10/2015  . History of stroke 06/10/2015  . Coronary artery disease due to lipid rich plaque 06/10/2015  . Afib ([place])   . Elevated uric acid in blood   . Arthritis of ankle   . Pulmonary HTN ([place])     Past Medical History:  Diagnosis Date  . Afib ([place])   . Arthritis of ankle   . CAD (coronary artery disease)   . Cancer [facility]) 2012   breast cancer-right  . Compression fracture of T12 vertebra (HCC)   .  Diverticulosis   . Edema   . Elevated uric acid in blood   . GI bleed 12/2015  . Hyperlipidemia   . Hypertension   . Long-term (current) use of anticoagulants   . Moderate mitral regurgitation   . Pulmonary HTN ([place])    Echo 1/19: EF 60-65, no RWMA, Ao sclerosis, trivial AI, mild MR, mod BAE, mod TR, PASP 49  . Stroke ([place]) 01/25/2016  . TIA (transient ischemic attack) 09/2011  . Urinary incontinence   . Urinary retention     Past Surgical History:  Procedure Laterality Date  . APPENDECTOMY    . BREAST SURGERY  2012   Rt.mastectomy--[place], [place]  2013  . CATARACT EXTRACTION, BILATERAL    . DILATION AND CURETTAGE OF UTERUS     in her early 71's for DUB  . ESOPHAGOGASTRODUODENOSCOPY Left 01/08/2016   Procedure: ESOPHAGOGASTRODUODENOSCOPY (EGD);  Surgeon: Arta Silence, MD;  Location: [facility];  Service: Endoscopy;  Laterality: Left;  . GALLBLADDER SURGERY    . MASTECTOMY Right   . RADIOLOGY WITH ANESTHESIA N/A 01/25/2016   Procedure: RADIOLOGY WITH ANESTHESIA;  Surgeon: Luanne Bras, MD;  Location: [facility];  Service: Radiology;  Laterality:  N/A;  . REPLACEMENT TOTAL KNEE BILATERAL    . TONSILLECTOMY AND ADENOIDECTOMY     as a child    Current Outpatient Medications  Medication Sig Dispense Refill  . acetaminophen (TYLENOL) 500 MG tablet Take 500-1,000 mg by mouth every 12 (twelve) hours as needed (pain).     Marland Kitchen amoxicillin (AMOXIL) 500 MG capsule Only takes as preventative prior to dentist    . apixaban (ELIQUIS) 5 MG TABS tablet Take 1 tablet (5 mg total) by mouth 2 (two) times daily. 180 tablet 1  . atorvastatin (LIPITOR) 10 MG tablet Take 1 tablet (10 mg total) by mouth daily. 90 tablet 2  . calcium-vitamin D 250-100 MG-UNIT tablet Take 2 tablets by mouth 2 (two) times daily.     . Cholecalciferol (VITAMIN D-3) 1000 units CAPS Take 1,000 Units by mouth daily.    Marland Kitchen diltiazem (CARDIZEM) 60 MG tablet Take 1 tablet (60 mg total) by mouth 3  (three) times daily. 270 tablet 3  . furosemide (LASIX) 20 MG tablet Take 20 mg by mouth daily as needed.    . metoprolol tartrate (LOPRESSOR) 100 MG tablet Take 1 tablet (100 mg total) by mouth 2 (two) times daily. 180 tablet 3  . Multiple Vitamins-Minerals (MULTIVITAMIN ADULT PO) Take 1 tablet by mouth daily.     Marland Kitchen omeprazole (PRILOSEC) 40 MG capsule Take 40 mg by mouth every other day.     . vitamin C (ASCORBIC ACID) 500 MG tablet Take 1,000 mg by mouth daily.      No current facility-administered medications for this visit.      ALLERGIES: Valium [diazepam]; Amiodarone hcl [amiodarone]; Compazine [prochlorperazine]; Other; Thorazine [chlorpromazine]; Zometa [zoledronic acid]; and Ciprofloxacin  Family History  Problem Relation Age of Onset  . Asthma Mother   . CVA Father   . Atrial fibrillation Sister        HAS PACER  . Pneumonia Brother   . Cancer Daughter        COLON CANCER  . Cancer Maternal Grandmother        breast cancer    Social History   Socioeconomic History  . Marital status: Married    Spouse name: Not on file  . Number of children: Not on file  . Years of education: Not on file  . Highest education level: Not on file  Occupational History  . Not on file  Social Needs  . Financial resource strain: Not on file  . Food insecurity:    Worry: Not on file    Inability: Not on file  . Transportation needs:    Medical: Not on file    Non-medical: Not on file  Tobacco Use  . Smoking status: Former Smoker    Types: Cigarettes  . Smokeless tobacco: Never Used  Substance and Sexual Activity  . Alcohol use: No    Alcohol/week: 0.0 standard drinks  . Drug use: No  . Sexual activity: Yes    Partners: Male    Birth control/protection: Post-menopausal  Lifestyle  . Physical activity:    Days per week: Not on file    Minutes per session: Not on file  . Stress: Not on file  Relationships  . Social connections:    Talks on phone: Not on file    Gets  together: Not on file    Attends religious service: Not on file    Active member of club or organization: Not on file    Attends meetings of clubs or organizations:  Not on file    Relationship status: Not on file  . Intimate partner violence:    Fear of current or ex partner: Not on file    Emotionally abused: Not on file    Physically abused: Not on file    Forced sexual activity: Not on file  Other Topics Concern  . Not on file  Social History Narrative  . Not on file    Review of Systems  See HPI.   PHYSICAL EXAMINATION:    BP (!) 150/82 (BP Location: Right Arm, Patient Position: Sitting, Cuff Size: Large)   Pulse 76   Ht 5\' 2"  (1.575 m)   Wt 151 lb (68.5 kg)   LMP 09/12/1973 (Approximate)   BMI 27.62 kg/m     General appearance: alert, cooperative and appears stated age  Pelvic: External genitalia:  no lesions              Urethra:  normal appearing urethra with no masses, tenderness or lesions              Bartholins and Skenes: normal                 Vagina: normal appearing vagina with normal color and discharge, no lesions.  Polypoid change of the bilateral vaginal apical sidewalls.  No evidence of posterior vaginal ulceration.               Cervix: no lesions                Bimanual Exam:  Uterus:  normal size, contour, position, consistency, mobility, non-tender              Adnexa: no mass, fullness, tenderness        #7 pessary placed.   Chaperone was present for exam.  ASSESSMENT  Incomplete uterovaginal prolapse.  Vaginal ulceration of the posterior wall healed with discontinuation of Gelhorn pessary.  PLAN  Continue with #7 ring pessary with support.  Fu in about 6 weeks, sooner as needed.    An After Visit Summary was printed and given to the patient.  ___15__ minutes face to face time of which over 50% was spent in counseling.

## 2018-07-23 DIAGNOSIS — R300 Dysuria: Secondary | ICD-10-CM | POA: Diagnosis not present

## 2018-07-24 DIAGNOSIS — R300 Dysuria: Secondary | ICD-10-CM | POA: Diagnosis not present

## 2018-07-27 DIAGNOSIS — L820 Inflamed seborrheic keratosis: Secondary | ICD-10-CM | POA: Diagnosis not present

## 2018-07-27 DIAGNOSIS — L821 Other seborrheic keratosis: Secondary | ICD-10-CM | POA: Diagnosis not present

## 2018-07-27 DIAGNOSIS — L304 Erythema intertrigo: Secondary | ICD-10-CM | POA: Diagnosis not present

## 2018-08-13 DIAGNOSIS — R300 Dysuria: Secondary | ICD-10-CM | POA: Diagnosis not present

## 2018-08-14 NOTE — Progress Notes (Signed)
GYNECOLOGY  VISIT   HPI: 84 y.o.   Married  Caucasian  female   [phone] with Patient's last menstrual period was 09/12/1973 (approximate).   here for pessary check.  Using #7 ring with support.   Patient states still having some vaginal spotting and now with external vaginal itching. She has tried OTC cream without relief. Has used Nystatin/triamcinolone which has helped a little bit.   Wearing protective padding all the time due to urinary incontinence.  Voiding well.   Just treated with Keflex for a E Coli UTI.  She is waiting for her follow up culture result. She states sometime she feels the symptoms and sometimes not.  She has seen Dr. Matilde Sprang in the past.   On Eliquis.   GYNECOLOGIC HISTORY: Patient's last menstrual period was 09/12/1973 (approximate). Contraception:  Postmenopausal Menopausal hormone therapy:  none Last mammogram:  11-15-17 Neg breast MRI  Last pap smear: 11/2015 normal        OB History    Gravida  3   Para  3   Term  3   Preterm      AB      Living  3     SAB      TAB      Ectopic      Multiple      Live Births                 Patient Active Problem List   Diagnosis Date Noted  . Diplopia 01/29/2016  . GERD (gastroesophageal reflux disease) 01/29/2016  . Cerebrovascular accident (CVA) due to embolism of left middle cerebral artery ([place]) 01/29/2016  . Persistent atrial fibrillation   . Bleeding gastrointestinal   . Gait disturbance, post-stroke 01/27/2016  . Fall   . Secondary hypertension, unspecified   . Cerebral infarction due to thrombosis of left middle cerebral artery (HCC) s/p mechanical thrombectomy   . Malnutrition of moderate degree 01/09/2016  . GI bleed 01/06/2016  . Chronic atrial fibrillation ([place]) 06/10/2015  . Pulmonary hypertension, secondary ([place]) 06/10/2015  . Chronic anticoagulation 06/10/2015  . Hyperlipidemia 06/10/2015  . Essential hypertension 06/10/2015  . History of stroke 06/10/2015  .  Coronary artery disease due to lipid rich plaque 06/10/2015  . Afib ([place])   . Elevated uric acid in blood   . Arthritis of ankle   . Pulmonary HTN ([place])     Past Medical History:  Diagnosis Date  . Afib ([place])   . Arthritis of ankle   . CAD (coronary artery disease)   . Cancer [facility]) 2012   breast cancer-right  . Compression fracture of T12 vertebra (HCC)   . Diverticulosis   . Edema   . Elevated uric acid in blood   . GI bleed 12/2015  . Hyperlipidemia   . Hypertension   . Long-term (current) use of anticoagulants   . Moderate mitral regurgitation   . Pulmonary HTN ([place])    Echo 1/19: EF 60-65, no RWMA, Ao sclerosis, trivial AI, mild MR, mod BAE, mod TR, PASP 49  . Stroke ([place]) 01/25/2016  . TIA (transient ischemic attack) 09/2011  . Urinary incontinence   . Urinary retention     Past Surgical History:  Procedure Laterality Date  . APPENDECTOMY    . BREAST SURGERY  2012   Rt.mastectomy--[place], [facility]  2013  . CATARACT EXTRACTION, BILATERAL    . DILATION AND CURETTAGE OF UTERUS     in her early 25's for  DUB  . ESOPHAGOGASTRODUODENOSCOPY Left 01/08/2016   Procedure: ESOPHAGOGASTRODUODENOSCOPY (EGD);  Surgeon: Arta Silence, MD;  Location: [facility];  Service: Endoscopy;  Laterality: Left;  . GALLBLADDER SURGERY    . MASTECTOMY Right   . RADIOLOGY WITH ANESTHESIA N/A 01/25/2016   Procedure: RADIOLOGY WITH ANESTHESIA;  Surgeon: Luanne Bras, MD;  Location: [facility];  Service: Radiology;  Laterality: N/A;  . REPLACEMENT TOTAL KNEE BILATERAL    . TONSILLECTOMY AND ADENOIDECTOMY     as a child    Current Outpatient Medications  Medication Sig Dispense Refill  . acetaminophen (TYLENOL) 500 MG tablet Take 500-1,000 mg by mouth every 12 (twelve) hours as needed (pain).     Marland Kitchen amoxicillin (AMOXIL) 500 MG capsule Only takes as preventative prior to dentist    . apixaban (ELIQUIS) 5 MG TABS tablet Take 1 tablet (5 mg total) by mouth 2 (two) times  daily. 180 tablet 1  . atorvastatin (LIPITOR) 10 MG tablet Take 1 tablet (10 mg total) by mouth daily. 90 tablet 2  . calcium-vitamin D 250-100 MG-UNIT tablet Take 2 tablets by mouth 2 (two) times daily.     . Cholecalciferol (VITAMIN D-3) 1000 units CAPS Take 1,000 Units by mouth daily.    Marland Kitchen diltiazem (CARDIZEM) 60 MG tablet Take 1 tablet (60 mg total) by mouth 3 (three) times daily. 270 tablet 3  . furosemide (LASIX) 20 MG tablet Take 20 mg by mouth daily as needed.    . metoprolol tartrate (LOPRESSOR) 100 MG tablet Take 1 tablet (100 mg total) by mouth 2 (two) times daily. 180 tablet 3  . Multiple Vitamins-Minerals (MULTIVITAMIN ADULT PO) Take 1 tablet by mouth daily.     Marland Kitchen omeprazole (PRILOSEC) 40 MG capsule Take 40 mg by mouth every other day.     . vitamin C (ASCORBIC ACID) 500 MG tablet Take 1,000 mg by mouth daily.      No current facility-administered medications for this visit.      ALLERGIES: Valium [diazepam]; Amiodarone hcl [amiodarone]; Compazine [prochlorperazine]; Other; Thorazine [chlorpromazine]; Zometa [zoledronic acid]; and Ciprofloxacin  Family History  Problem Relation Age of Onset  . Asthma Mother   . CVA Father   . Atrial fibrillation Sister        HAS PACER  . Pneumonia Brother   . Cancer Daughter        COLON CANCER  . Cancer Maternal Grandmother        breast cancer    Social History   Socioeconomic History  . Marital status: Married    Spouse name: Not on file  . Number of children: Not on file  . Years of education: Not on file  . Highest education level: Not on file  Occupational History  . Not on file  Social Needs  . Financial resource strain: Not on file  . Food insecurity:    Worry: Not on file    Inability: Not on file  . Transportation needs:    Medical: Not on file    Non-medical: Not on file  Tobacco Use  . Smoking status: Former Smoker    Types: Cigarettes  . Smokeless tobacco: Never Used  Substance and Sexual Activity  .  Alcohol use: No    Alcohol/week: 0.0 standard drinks  . Drug use: No  . Sexual activity: Yes    Partners: Male    Birth control/protection: Post-menopausal  Lifestyle  . Physical activity:    Days per week: Not on file    Minutes  per session: Not on file  . Stress: Not on file  Relationships  . Social connections:    Talks on phone: Not on file    Gets together: Not on file    Attends religious service: Not on file    Active member of club or organization: Not on file    Attends meetings of clubs or organizations: Not on file    Relationship status: Not on file  . Intimate partner violence:    Fear of current or ex partner: Not on file    Emotionally abused: Not on file    Physically abused: Not on file    Forced sexual activity: Not on file  Other Topics Concern  . Not on file  Social History Narrative  . Not on file    Review of Systems  All other systems reviewed and are negative.   PHYSICAL EXAMINATION:    BP (!) 150/84 (BP Location: Right Arm, Patient Position: Sitting, Cuff Size: Large)   Pulse 66   Ht 5\' 2"  (1.575 m)   Wt 149 lb 9.6 oz (67.9 kg)   LMP 09/12/1973 (Approximate)   BMI 27.36 kg/m     General appearance: alert, cooperative and appears stated age Head: Normocephalic, without obvious abnormality, atraumatic Neck: no adenopathy, supple, symmetrical, trachea midline and thyroid normal to inspection and palpation Lungs: clear to auscultation bilaterally Breasts: normal appearance, no masses or tenderness, No nipple retraction or dimpling, No nipple discharge or bleeding, No axillary or supraclavicular adenopathy Heart: regular rate and rhythm Abdomen: soft, non-tender, no masses,  no organomegaly Extremities: extremities normal, atraumatic, no cyanosis or edema Skin: Skin color, texture, turgor normal. No rashes or lesions Lymph nodes: Cervical, supraclavicular, and axillary nodes normal. No abnormal inguinal nodes palpated Neurologic: Grossly  normal  Pelvic: External genitalia:  no lesions              Urethra:  normal appearing urethra with no masses, tenderness or lesions              Bartholins and Skenes: normal                 Vagina: normal appearing vagina with normal color and discharge, no lesions.  Polypoid scar tissue at bilateral vaginal apices.  Minor erythema of the vaginal vault.               Cervix: no lesions                Bimanual Exam:  Uterus:  normal size, contour, position, consistency, mobility, non-tender              Adnexa: no mass, fullness, tenderness               Pessary cleansed and replaced.   Chaperone was present for exam.  ASSESSMENT  Complete uterovaginal prolapse.  Doing well overall with #7 ring with support.  Vulvovaginitis. Recurrent UTIs.  PLAN  Affirm.  Use hypoallergenic pads. Use Vaseline externally.  Can also try Kenalog ointment 0.5% bid prn.  She will follow up with Dr. Matilde Sprang for recurrent UTIs.  Referral placed. Fu in 2 months.    An After Visit Summary was printed and given to the patient.  __25____ minutes face to face time of which over 50% was spent in counseling.

## 2018-08-15 ENCOUNTER — Ambulatory Visit (INDEPENDENT_AMBULATORY_CARE_PROVIDER_SITE_OTHER): Payer: Medicare Other | Admitting: Obstetrics and Gynecology

## 2018-08-15 ENCOUNTER — Encounter: Payer: Self-pay | Admitting: Obstetrics and Gynecology

## 2018-08-15 ENCOUNTER — Other Ambulatory Visit: Payer: Self-pay

## 2018-08-15 VITALS — BP 150/84 | HR 66 | Ht 62.0 in | Wt 149.6 lb

## 2018-08-15 DIAGNOSIS — N813 Complete uterovaginal prolapse: Secondary | ICD-10-CM | POA: Diagnosis not present

## 2018-08-15 DIAGNOSIS — Z4689 Encounter for fitting and adjustment of other specified devices: Secondary | ICD-10-CM | POA: Diagnosis not present

## 2018-08-15 DIAGNOSIS — N390 Urinary tract infection, site not specified: Secondary | ICD-10-CM | POA: Diagnosis not present

## 2018-08-15 DIAGNOSIS — I6523 Occlusion and stenosis of bilateral carotid arteries: Secondary | ICD-10-CM | POA: Diagnosis not present

## 2018-08-15 DIAGNOSIS — N760 Acute vaginitis: Secondary | ICD-10-CM | POA: Diagnosis not present

## 2018-08-15 MED ORDER — TRIAMCINOLONE ACETONIDE 0.5 % EX OINT: 1.0000 "application " | TOPICAL_OINTMENT | Freq: Two times a day (BID) | CUTANEOUS | 0 refills | Status: DC

## 2018-08-16 LAB — VAGINITIS/VAGINOSIS, DNA PROBE
Candida Species: NEGATIVE
GARDNERELLA VAGINALIS: NEGATIVE
TRICHOMONAS VAG: NEGATIVE

## 2018-08-23 DIAGNOSIS — N3020 Other chronic cystitis without hematuria: Secondary | ICD-10-CM | POA: Diagnosis not present

## 2018-08-23 DIAGNOSIS — N3946 Mixed incontinence: Secondary | ICD-10-CM | POA: Diagnosis not present

## 2018-08-29 DIAGNOSIS — Z79899 Other long term (current) drug therapy: Secondary | ICD-10-CM | POA: Diagnosis not present

## 2018-08-29 DIAGNOSIS — N183 Chronic kidney disease, stage 3 (moderate): Secondary | ICD-10-CM | POA: Diagnosis not present

## 2018-08-29 DIAGNOSIS — K219 Gastro-esophageal reflux disease without esophagitis: Secondary | ICD-10-CM | POA: Diagnosis not present

## 2018-08-29 DIAGNOSIS — Z6827 Body mass index (BMI) 27.0-27.9, adult: Secondary | ICD-10-CM | POA: Diagnosis not present

## 2018-08-29 DIAGNOSIS — N390 Urinary tract infection, site not specified: Secondary | ICD-10-CM | POA: Diagnosis not present

## 2018-09-10 DIAGNOSIS — J069 Acute upper respiratory infection, unspecified: Secondary | ICD-10-CM | POA: Diagnosis not present

## 2018-09-10 DIAGNOSIS — J0100 Acute maxillary sinusitis, unspecified: Secondary | ICD-10-CM | POA: Diagnosis not present

## 2018-09-19 DIAGNOSIS — R3989 Other symptoms and signs involving the genitourinary system: Secondary | ICD-10-CM | POA: Diagnosis not present

## 2018-09-19 DIAGNOSIS — R509 Fever, unspecified: Secondary | ICD-10-CM | POA: Diagnosis not present

## 2018-09-19 DIAGNOSIS — N3001 Acute cystitis with hematuria: Secondary | ICD-10-CM | POA: Diagnosis not present

## 2018-10-02 ENCOUNTER — Other Ambulatory Visit: Payer: Self-pay

## 2018-10-02 MED ORDER — DILTIAZEM HCL 60 MG PO TABS: 60.0000 mg | ORAL_TABLET | Freq: Three times a day (TID) | ORAL | 2 refills | Status: DC

## 2018-10-02 MED ORDER — METOPROLOL TARTRATE 100 MG PO TABS: 100.0000 mg | ORAL_TABLET | Freq: Two times a day (BID) | ORAL | 2 refills | Status: DC

## 2018-10-02 MED ORDER — ATORVASTATIN CALCIUM 10 MG PO TABS: 10.0000 mg | ORAL_TABLET | Freq: Every day | ORAL | 2 refills | Status: DC

## 2018-10-02 MED ORDER — APIXABAN 5 MG PO TABS: 5.0000 mg | ORAL_TABLET | Freq: Two times a day (BID) | ORAL | 0 refills | Status: DC

## 2018-10-10 ENCOUNTER — Telehealth: Payer: Self-pay | Admitting: Nurse Practitioner

## 2018-10-10 NOTE — Telephone Encounter (Signed)
I called the pt back and explained to her that we will need to have clearance form faxed to our office from the Oral Surgeon's office letting us know waht is being done and if any meds need to be held and for how etc. I explained that once the doctor looks at her chart they will make a decision on how long Eliquis or other meds need to be held and we will let the Oral Surgeon know of our recommendations. Pt states she has appt tomorrow with Chief Financial Officer. Pt thanked me for the call back and explaining things to her.

## 2018-10-10 NOTE — Telephone Encounter (Signed)
New message    Pt stated that she needs work done on the roof of her mouth and they may have to do some cutting. Advised to have office call in. Pt still wants nurse to follow up to explain stopping medication

## 2018-10-17 ENCOUNTER — Ambulatory Visit: Payer: Medicare Other | Admitting: Obstetrics and Gynecology

## 2018-10-19 ENCOUNTER — Other Ambulatory Visit: Payer: Self-pay

## 2018-10-19 ENCOUNTER — Encounter: Payer: Self-pay | Admitting: Obstetrics and Gynecology

## 2018-10-19 ENCOUNTER — Ambulatory Visit (INDEPENDENT_AMBULATORY_CARE_PROVIDER_SITE_OTHER): Payer: Medicare Other | Admitting: Obstetrics and Gynecology

## 2018-10-19 VITALS — BP 132/74 | HR 70 | Ht 62.0 in | Wt 149.6 lb

## 2018-10-19 DIAGNOSIS — T8369XD Infection and inflammatory reaction due to other prosthetic device, implant and graft in genital tract, subsequent encounter: Secondary | ICD-10-CM | POA: Diagnosis not present

## 2018-10-19 DIAGNOSIS — Z4689 Encounter for fitting and adjustment of other specified devices: Secondary | ICD-10-CM

## 2018-10-19 DIAGNOSIS — N813 Complete uterovaginal prolapse: Secondary | ICD-10-CM

## 2018-10-19 DIAGNOSIS — N760 Acute vaginitis: Secondary | ICD-10-CM | POA: Diagnosis not present

## 2018-10-19 NOTE — Progress Notes (Signed)
GYNECOLOGY  VISIT   HPI: 85 y.o.   Married  Caucasian  female   [phone] with Patient's last menstrual period was 09/12/1973 (approximate).   here for pessary check.  Doing well with her pessary.  She can empty her bladder.  No pain or discomfort.  Occasionally she has some spotting and smudges on her pad.   She brings her pessary in with her today and has already removed it.  Places Vaseline on the pessary.  Used Triosam in the past.  Treated for a UTI since her last visit.  Had an appointment yesterday with Dr. Matilde Sprang, but she had to reschedule to next week due to weather. Is on Nitrofurantoin 100 mg daily.  GYNECOLOGIC HISTORY: Patient's last menstrual period was 09/12/1973 (approximate). Contraception: Postmenopausal Menopausal hormone therapy:  none Last mammogram: 11-15-17 Neg breast MRI Last pap smear: 11-25-15 Neg        OB History    Gravida  3   Para  3   Term  3   Preterm      AB      Living  3     SAB      TAB      Ectopic      Multiple      Live Births                 Patient Active Problem List   Diagnosis Date Noted  . Diplopia 01/29/2016  . GERD (gastroesophageal reflux disease) 01/29/2016  . Cerebrovascular accident (CVA) due to embolism of left middle cerebral artery ([place]) 01/29/2016  . Persistent atrial fibrillation   . Bleeding gastrointestinal   . Gait disturbance, post-stroke 01/27/2016  . Fall   . Secondary hypertension, unspecified   . Cerebral infarction due to thrombosis of left middle cerebral artery (HCC) s/p mechanical thrombectomy   . Malnutrition of moderate degree 01/09/2016  . GI bleed 01/06/2016  . Chronic atrial fibrillation ([place]) 06/10/2015  . Pulmonary hypertension, secondary ([place]) 06/10/2015  . Chronic anticoagulation 06/10/2015  . Hyperlipidemia 06/10/2015  . Essential hypertension 06/10/2015  . History of stroke 06/10/2015  . Coronary artery disease due to lipid rich plaque 06/10/2015  . Afib ([place])    . Elevated uric acid in blood   . Arthritis of ankle   . Pulmonary HTN ([place])     Past Medical History:  Diagnosis Date  . Afib ([place])   . Arthritis of ankle   . CAD (coronary artery disease)   . Cancer [facility]) 2012   breast cancer-right  . Compression fracture of T12 vertebra (HCC)   . Diverticulosis   . Edema   . Elevated uric acid in blood   . GI bleed 12/2015  . Hyperlipidemia   . Hypertension   . Long-term (current) use of anticoagulants   . Moderate mitral regurgitation   . Pulmonary HTN ([place])    Echo 1/19: EF 60-65, no RWMA, Ao sclerosis, trivial AI, mild MR, mod BAE, mod TR, PASP 49  . Stroke ([place]) 01/25/2016  . TIA (transient ischemic attack) 09/2011  . Urinary incontinence   . Urinary retention     Past Surgical History:  Procedure Laterality Date  . APPENDECTOMY    . BREAST SURGERY  2012   Rt.mastectomy--[place], [place]  2013  . CATARACT EXTRACTION, BILATERAL    . DILATION AND CURETTAGE OF UTERUS     in her early 15's for DUB  . ESOPHAGOGASTRODUODENOSCOPY Left 01/08/2016   Procedure: ESOPHAGOGASTRODUODENOSCOPY (EGD);  Surgeon: Arta Silence, MD;  Location: [facility];  Service: Endoscopy;  Laterality: Left;  . GALLBLADDER SURGERY    . MASTECTOMY Right   . RADIOLOGY WITH ANESTHESIA N/A 01/25/2016   Procedure: RADIOLOGY WITH ANESTHESIA;  Surgeon: Luanne Bras, MD;  Location: [facility];  Service: Radiology;  Laterality: N/A;  . REPLACEMENT TOTAL KNEE BILATERAL    . TONSILLECTOMY AND ADENOIDECTOMY     as a child    Current Outpatient Medications  Medication Sig Dispense Refill  . acetaminophen (TYLENOL) 500 MG tablet Take 500-1,000 mg by mouth every 12 (twelve) hours as needed (pain).     Marland Kitchen amoxicillin (AMOXIL) 500 MG capsule Only takes as preventative prior to dentist    . apixaban (ELIQUIS) 5 MG TABS tablet Take 1 tablet (5 mg total) by mouth 2 (two) times daily. 180 tablet 0  . atorvastatin (LIPITOR) 10 MG tablet Take 1 tablet  (10 mg total) by mouth daily. 90 tablet 2  . calcium-vitamin D 250-100 MG-UNIT tablet Take 2 tablets by mouth 2 (two) times daily.     . Cholecalciferol (VITAMIN D-3) 1000 units CAPS Take 1,000 Units by mouth daily.    Marland Kitchen diltiazem (CARDIZEM) 60 MG tablet Take 1 tablet (60 mg total) by mouth 3 (three) times daily. 270 tablet 2  . furosemide (LASIX) 20 MG tablet Take 20 mg by mouth daily as needed.    Marland Kitchen ipratropium (ATROVENT) 0.06 % nasal spray Place 1 spray into both nostrils daily.    . metoprolol tartrate (LOPRESSOR) 100 MG tablet Take 1 tablet (100 mg total) by mouth 2 (two) times daily. 180 tablet 2  . Multiple Vitamins-Minerals (MULTIVITAMIN ADULT PO) Take 1 tablet by mouth daily.     . nitrofurantoin, macrocrystal-monohydrate, (MACROBID) 100 MG capsule Take 1 capsule by mouth daily.    Marland Kitchen omeprazole (PRILOSEC) 20 MG capsule Take 1 capsule by mouth every other day.    . triamcinolone ointment (KENALOG) 0.5 % Apply 1 application topically 2 (two) times daily. 30 g 0  . vitamin C (ASCORBIC ACID) 500 MG tablet Take 1,000 mg by mouth daily.      No current facility-administered medications for this visit.      ALLERGIES: Valium [diazepam]; Amiodarone hcl [amiodarone]; Compazine [prochlorperazine]; Other; Thorazine [chlorpromazine]; Zometa [zoledronic acid]; and Ciprofloxacin  Family History  Problem Relation Age of Onset  . Asthma Mother   . CVA Father   . Atrial fibrillation Sister        HAS PACER  . Pneumonia Brother   . Cancer Daughter        COLON CANCER  . Cancer Maternal Grandmother        breast cancer    Social History   Socioeconomic History  . Marital status: Married    Spouse name: Not on file  . Number of children: Not on file  . Years of education: Not on file  . Highest education level: Not on file  Occupational History  . Not on file  Social Needs  . Financial resource strain: Not on file  . Food insecurity:    Worry: Not on file    Inability: Not on file   . Transportation needs:    Medical: Not on file    Non-medical: Not on file  Tobacco Use  . Smoking status: Former Smoker    Types: Cigarettes  . Smokeless tobacco: Never Used  Substance and Sexual Activity  . Alcohol use: No    Alcohol/week: 0.0 standard drinks  .  Drug use: No  . Sexual activity: Yes    Partners: Male    Birth control/protection: Post-menopausal  Lifestyle  . Physical activity:    Days per week: Not on file    Minutes per session: Not on file  . Stress: Not on file  Relationships  . Social connections:    Talks on phone: Not on file    Gets together: Not on file    Attends religious service: Not on file    Active member of club or organization: Not on file    Attends meetings of clubs or organizations: Not on file    Relationship status: Not on file  . Intimate partner violence:    Fear of current or ex partner: Not on file    Emotionally abused: Not on file    Physically abused: Not on file    Forced sexual activity: Not on file  Other Topics Concern  . Not on file  Social History Narrative  . Not on file    Review of Systems  All other systems reviewed and are negative.   PHYSICAL EXAMINATION:    BP 132/74 (BP Location: Right Arm, Patient Position: Sitting, Cuff Size: Large)   Pulse 70   Ht 5\' 2"  (1.575 m)   Wt 149 lb 9.6 oz (67.9 kg)   LMP 09/12/1973 (Approximate)   BMI 27.36 kg/m     General appearance: alert, cooperative and appears stated age   Pelvic: External genitalia:  no lesions              Urethra:  normal appearing urethra with no masses, tenderness or lesions              Bartholins and Skenes: normal                 Vagina: Complete uterovaginal prolapse.  The superior vaginal walls have polyp like formation (from chronic contact with the pessary).  Left vaginal side wall also with some granulation tissue - treated with AgNO3 today.              Cervix: no lesions                Bimanual Exam:  Uterus:  normal size,  contour, position, consistency, mobility, non-tender              Adnexa: no mass, fullness, tenderness        Pessary cleansed and replaced with water based lubricant on it.   Chaperone was present for exam.  ASSESSMENT  Complete uterovaginal prolapse.  Doing well overall with #7 ring with support.  Recurrent UTIs.  On Macrobid.  Hx afib, CVA.  On Elaquis.  Hx right breast cancer.   PLAN  Continue current pessary.  I recommend she use a water based lubricant with the pessary and not Vaseline.  She will return for her annual exam and pessary care in 2 months.    An After Visit Summary was printed and given to the patient.  __15____ minutes face to face time of which over 50% was spent in counseling.

## 2018-10-23 DIAGNOSIS — N3946 Mixed incontinence: Secondary | ICD-10-CM | POA: Diagnosis not present

## 2018-10-23 DIAGNOSIS — R350 Frequency of micturition: Secondary | ICD-10-CM | POA: Diagnosis not present

## 2018-10-23 DIAGNOSIS — N3020 Other chronic cystitis without hematuria: Secondary | ICD-10-CM | POA: Diagnosis not present

## 2018-11-14 DIAGNOSIS — J029 Acute pharyngitis, unspecified: Secondary | ICD-10-CM | POA: Diagnosis not present

## 2018-11-14 DIAGNOSIS — J069 Acute upper respiratory infection, unspecified: Secondary | ICD-10-CM | POA: Diagnosis not present

## 2018-11-28 ENCOUNTER — Ambulatory Visit: Payer: Medicare Other | Admitting: Nurse Practitioner

## 2018-12-07 ENCOUNTER — Other Ambulatory Visit: Payer: Self-pay | Admitting: *Deleted

## 2018-12-10 MED ORDER — APIXABAN 5 MG PO TABS: 5.0000 mg | ORAL_TABLET | Freq: Two times a day (BID) | ORAL | 1 refills | Status: DC

## 2018-12-10 NOTE — Telephone Encounter (Signed)
Age 85, weight 68kg, SCr 1.09 from December 2019 [facility] labs. Last OV 05/2018.

## 2018-12-27 ENCOUNTER — Ambulatory Visit: Payer: Medicare Other | Admitting: Obstetrics and Gynecology

## 2019-02-12 ENCOUNTER — Telehealth: Payer: Self-pay | Admitting: *Deleted

## 2019-02-12 NOTE — Progress Notes (Addendum)
Telehealth Visit     Virtual Visit via Video Note   This visit type was conducted due to national recommendations for restrictions regarding the COVID-19 Pandemic (e.g. social distancing) in an effort to limit this patient's exposure and mitigate transmission in our community.  Due to her co-morbid illnesses, this patient is at least at moderate risk for complications without adequate follow up.  This format is felt to be most appropriate for this patient at this time.  All issues noted in this document were discussed and addressed.  A limited physical exam was performed with this format.  Please refer to the patient's chart for her consent to telehealth for [facility].   Evaluation Performed:  Follow-up visit  This visit type was conducted due to national recommendations for restrictions regarding the COVID-19 Pandemic (e.g. social distancing).  This format is felt to be most appropriate for this patient at this time.  All issues noted in this document were discussed and addressed.  No physical exam was performed (except for noted visual exam findings with Video Visits).  Please refer to the patient's chart (MyChart message for video visits and phone note for telephone visits) for the patient's consent to telehealth for [facility].  Date:  02/13/2019   ID:  Adrienne [name], DOB [birth year], MRN [id number]  Patient Location:  Home  Provider location:   Home  PCP:  Kathyrn Lass, MD  Cardiologist:  Marisa Cyphers Electrophysiologist:  None   Chief Complaint:  Follow up  History of Present Illness:    Adrienne [name] is a 85 y.o. female who presents via audio/video conferencing for a telehealth visit today.  Seen for Dr. Marlou Porch.   She has a history of atrial fibrillation, pulmonary hypertension, coronary artery disease, mitral regurgitation on chronic anticoagulation.  She has had intolerance to amiodarone - she is managed with rate control. She has had a prior stroke. Other  issues include breast cancer, nonobstructive CAD per cath in 2013, moderate MR, HLD with intolerance to statins and vertigo. She has had chronic dyspnea - has had PTH in the past.   Admitted in April2017with intermittent bloody stools. Anticoagulation was placed on hold. GI work up unremarkable (Hiatal hernia and diverticulosis). On Jan 25, 2016 had right side weakness and was found in bathroom by husband. Coumadin was restarted about a week prior to this, INR 1.5.Left MCA infarct. Interventional radiology. Transitioned to Eliquis.   I last saw her in January of 2019 - seen twice last year by Dr. Marlou Porch - last in September - felt to be doing ok.   The patient does not have symptoms concerning for COVID-19 infection (fever, chills, cough, or new shortness of breath).   Seen today via Doximity video. She has consented for this visit. I am to see her husband virtually as well. Their BP cuff is broken - thus no reading. They are both still in their pajamas - it is 3:00 PM. Doing ok. Wanting to stay in. Glad this is a virtual visit. She is mostly concerned about her medicines - wondering if she is taking too much vitamin D - taking 3250 IU thru various supplements. No chest pain. No awareness of her AF. No problems with her Eliquis. No bleeding/excessive bruising. No swelling. She feels like she is doing ok.   Past Medical History:  Diagnosis Date   Afib ([place])    Arthritis of ankle    CAD (coronary artery disease)    Cancer ([place]) 2012   breast cancer-right  Compression fracture of T12 vertebra ([facility])    Diverticulosis    Edema    Elevated uric acid in blood    GI bleed 12/2015   Hyperlipidemia    Hypertension    Long-term (current) use of anticoagulants    Moderate mitral regurgitation    Pulmonary HTN ([place])    Echo 1/19: EF 60-65, no RWMA, Ao sclerosis, trivial AI, mild MR, mod BAE, mod TR, PASP 49   Stroke ([place]) 01/25/2016   TIA (transient ischemic attack) 09/2011    Urinary incontinence    Urinary retention    Past Surgical History:  Procedure Laterality Date   APPENDECTOMY     BREAST SURGERY  2012   Rt.mastectomy--[place], [place] CATHETERIZATION  2013   CATARACT EXTRACTION, BILATERAL     DILATION AND CURETTAGE OF UTERUS     in her early 65's for DUB   ESOPHAGOGASTRODUODENOSCOPY Left 01/08/2016   Procedure: ESOPHAGOGASTRODUODENOSCOPY (EGD);  Surgeon: Arta Silence, MD;  Location: [facility];  Service: Endoscopy;  Laterality: Left;   GALLBLADDER SURGERY     MASTECTOMY Right    RADIOLOGY WITH ANESTHESIA N/A 01/25/2016   Procedure: RADIOLOGY WITH ANESTHESIA;  Surgeon: Luanne Bras, MD;  Location: [facility];  Service: Radiology;  Laterality: N/A;   REPLACEMENT TOTAL KNEE BILATERAL     TONSILLECTOMY AND ADENOIDECTOMY     as a child     Current Meds  Medication Sig   acetaminophen (TYLENOL) 500 MG tablet Take 500-1,000 mg by mouth every 12 (twelve) hours as needed (pain).    amoxicillin (AMOXIL) 500 MG capsule Take 2,000 mg by mouth as needed. Only takes as preventative prior to dentist   apixaban (ELIQUIS) 5 MG TABS tablet Take 1 tablet (5 mg total) by mouth 2 (two) times daily.   atorvastatin (LIPITOR) 10 MG tablet Take 1 tablet (10 mg total) by mouth daily.   Calcium Citrate 250 MG TABS Take 5 tablets by mouth daily.   Cholecalciferol (VITAMIN D-3) 1000 units CAPS Take 1,000 Units by mouth daily.   diltiazem (CARDIZEM) 60 MG tablet Take 1 tablet (60 mg total) by mouth 3 (three) times daily.   furosemide (LASIX) 20 MG tablet Take 20 mg by mouth as needed for fluid or edema.    metoprolol tartrate (LOPRESSOR) 100 MG tablet Take 1 tablet (100 mg total) by mouth 2 (two) times daily.   Multiple Vitamins-Minerals (MULTIVITAMIN ADULT PO) Take 1 tablet by mouth daily.    nitrofurantoin (MACRODANTIN) 100 MG capsule Take 100 mg by mouth daily.    omeprazole (PRILOSEC) 20 MG capsule Take 1 capsule by mouth every other day.    vitamin C (ASCORBIC ACID) 500 MG tablet Take 1,000 mg by mouth daily.      Allergies:   Valium [diazepam]; Amiodarone hcl [amiodarone]; Compazine [prochlorperazine]; Other; Thorazine [chlorpromazine]; Zometa [zoledronic acid]; and Ciprofloxacin   Social History   Tobacco Use   Smoking status: Former Smoker    Types: Cigarettes   Smokeless tobacco: Never Used  Substance Use Topics   Alcohol use: No    Alcohol/week: 0.0 standard drinks   Drug use: No     Family Hx: The patient's family history includes Asthma in her mother; Atrial fibrillation in her sister; CVA in her father; Cancer in her daughter and maternal grandmother; Pneumonia in her brother.  ROS:   Please see the history of present illness.   All other systems reviewed are negative.    Objective:    Vital Signs:  Ht 5\' 1"  (1.549 m)    Wt 148 lb (67.1 kg)    LMP 09/12/1973 (Approximate)    BMI 27.96 kg/m    Wt Readings from Last 3 Encounters:  02/13/19 148 lb (67.1 kg)  10/19/18 149 lb 9.6 oz (67.9 kg)  08/15/18 149 lb 9.6 oz (67.9 kg)    Alert female in no acute distress. Not short of breath with conversation.    Labs/Other Tests and Data Reviewed:    Lab Results  Component Value Date   WBC 11.6 (H) 09/20/2017   HGB 12.1 09/20/2017   HCT 37.4 09/20/2017   PLT 325 09/20/2017   GLUCOSE 103 (H) 09/20/2017   CHOL 114 05/29/2018   TRIG 126 05/29/2018   HDL 48 05/29/2018   LDLCALC 41 05/29/2018   ALT 12 05/29/2018   AST 12 (L) 02/01/2016   NA 139 09/20/2017   K 4.9 09/20/2017   CL 98 09/20/2017   CREATININE 1.07 (H) 09/20/2017   BUN 22 09/20/2017   CO2 26 09/20/2017   INR 1.27 12/24/2016   HGBA1C 5.6 01/26/2016     BNP (last 3 results) No results for input(s): BNP in the last 8760 hours.  ProBNP (last 3 results) No results for input(s): PROBNP in the last 8760 hours.    Prior CV studies:    The following studies were reviewed today:  Echo Study Conclusions 09/2017  - Left  ventricle: The cavity size was normal. Wall thickness was   normal. Systolic function was normal. The estimated ejection   fraction was in the range of 60% to 65%. Wall motion was normal;   there were no regional wall motion abnormalities. The study is   not technically sufficient to allow evaluation of LV diastolic   function. - Aortic valve: Poorly visualized. Sclerosis without stenosis.   There was trivial regurgitation. - Mitral valve: Mildly thickened leaflets . There was mild   regurgitation. - Left atrium: Moderately dilated. - Right atrium: Moderately dilated. - Tricuspid valve: There was moderate regurgitation. - Pulmonary arteries: PA peak pressure: 49 mm Hg (S). - Inferior vena cava: The IVC measures >2.1 cm, but collapses >50%,   suggesting an elevated RA pressure of 8 mmHg.  Impressions:  - Compared to a study in 03/2017, the LVEF is higher at 60-65%.   There is moderate biatrial enlargement and the RVSP is unchanged   at 49 mmHg.  ASSESSMENT & PLAN:    1. Permanent atrial fibrillation - she is managed with rate control and continued anticoagulation - failed previously on amiodarone - now on Eliquis - she remains on the full dose strength - no recent labs - she is > 60kg but is age 27 - last creatine was 1.07. She has been on chronic short acting CCB for many years per her preference. She is doing well. No changes made today.   2. Prior stroke - in the setting of being off anticoagulation due to GI bleeding - not discussed today.   3. CAD - no active chest pain- she had non obstructive CAD with cath in 2013 while in [place]. No changes made today.   4. HLD - she was previously adversed to taking statin - now on low dose Lipitor. No labs noted since 05/2018 - LDL was 41 at that time.   5. Pulmonary hypertension - this was a diagnosis when she was in [place].  Her echocardiograms here showed reassuring PAPs. Not discussed today.   6. Chronic anticoagulation- on  Eliquis -  no problems noted - will need lab on return.   7. Chronic lower extremity edema/venous insufficiency - not really an issue at this time. She has had prior gout with daily use of Lasix.   8. History of breast cancer - prior mastectomy on the right - not discussed.   9. Carotid artery disease - Mild bilateral carotid stenosis.  Ultrasound checked 12/04/2017  10. Vitamin D - she is on 3250 IU per her calculations. She is not going outside to get sun. Levels are unknown. Only way to find out if this is too much is to get her labs checked. She was going to check with PCP or we could do on return.   10. COVID-19 Education: The signs and symptoms of COVID-19 were discussed with the patient and how to seek care for testing (follow up with PCP or arrange E-visit).  The importance of social distancing, staying at home, hand hygiene and wearing a mask when out in public were discussed today.  Patient Risk:   After full review of this patient's clinical status, I feel that they are at least moderate risk at this time.  Time:   Today, I have spent 9 minutes with the patient with telehealth technology discussing the above issues.     Medication Adjustments/Labs and Tests Ordered: Current medicines are reviewed at length with the patient today.  Concerns regarding medicines are outlined above.   Tests Ordered: No orders of the defined types were placed in this encounter.   Medication Changes: No orders of the defined types were placed in this encounter.   Disposition:  FU with me or Dr. Marlou Porch in August with fasting labs.    Patient is agreeable to this plan and will call if any problems develop in the interim.   Amie Critchley, NP  02/13/2019 3:13 PM    [facility]

## 2019-02-12 NOTE — Telephone Encounter (Signed)
lvm with instructions for telehealth visit tomorrow with Karmen Stabs @ 2:30.  Pt is instructed to R/S if cannot keep appt.  Consent will need to be placed in pts chart.

## 2019-02-13 ENCOUNTER — Telehealth (INDEPENDENT_AMBULATORY_CARE_PROVIDER_SITE_OTHER): Payer: Medicare Other | Admitting: Nurse Practitioner

## 2019-02-13 ENCOUNTER — Other Ambulatory Visit: Payer: Self-pay

## 2019-02-13 ENCOUNTER — Encounter: Payer: Self-pay | Admitting: Nurse Practitioner

## 2019-02-13 VITALS — Ht 61.0 in | Wt 148.0 lb

## 2019-02-13 DIAGNOSIS — Z7901 Long term (current) use of anticoagulants: Secondary | ICD-10-CM

## 2019-02-13 DIAGNOSIS — R0602 Shortness of breath: Secondary | ICD-10-CM

## 2019-02-13 DIAGNOSIS — I2510 Atherosclerotic heart disease of native coronary artery without angina pectoris: Secondary | ICD-10-CM

## 2019-02-13 DIAGNOSIS — I2583 Coronary atherosclerosis due to lipid rich plaque: Secondary | ICD-10-CM

## 2019-02-13 DIAGNOSIS — I272 Pulmonary hypertension, unspecified: Secondary | ICD-10-CM

## 2019-02-13 DIAGNOSIS — I482 Chronic atrial fibrillation, unspecified: Secondary | ICD-10-CM

## 2019-02-13 DIAGNOSIS — Z7189 Other specified counseling: Secondary | ICD-10-CM

## 2019-02-13 DIAGNOSIS — E785 Hyperlipidemia, unspecified: Secondary | ICD-10-CM

## 2019-02-13 NOTE — Patient Instructions (Addendum)
After Visit Summary:  We will be checking the following labs today - NONE   Medication Instructions:    Continue with your current medicines.    If you need a refill on your cardiac medications before your next appointment, please call your pharmacy.     Testing/Procedures To Be Arranged:  N/A  Follow-Up:   See me or Dr. Marlou Porch in August with fasting labs.     At [facility], you and your health needs are our priority.  As part of our continuing mission to provide you with exceptional heart care, we have created designated Provider Care Teams.  These Care Teams include your primary Cardiologist (physician) and Advanced Practice Providers (APPs -  Physician Assistants and Nurse Practitioners) who all work together to provide you with the care you need, when you need it.  Special Instructions:  . Stay safe, stay home, wash your hands for at least 20 seconds and wear a mask when out in public.  . It was good to talk with you both today.    Call the [place] office at [phone] if you have any questions, problems or concerns.

## 2019-02-13 NOTE — Telephone Encounter (Signed)
Virtual Visit Pre-Appointment Phone Call  "(Name), I am calling you today to discuss your upcoming appointment. We are currently trying to limit exposure to the virus that causes COVID-19 by seeing patients at home rather than in the office."  1. "What is the BEST phone number to call the day of the visit?" - include this in appointment notes  2. "Do you have or have access to (through a family member/friend) a smartphone with video capability that we can use for your visit?" a. If yes - list this number in appt notes as "cell" (if different from BEST phone #) and list the appointment type as a VIDEO visit in appointment notes b. If no - list the appointment type as a PHONE visit in appointment notes  3. Confirm consent - "In the setting of the current Covid19 crisis, you are scheduled for a (phone or video) visit with your provider on (Wednesday, June 3) at (2:30 pm ).  Just as we do with many in-office visits, in order for you to participate in this visit, we must obtain consent.  If you'd like, I can send this to your mychart (if signed up) or email for you to review.  Otherwise, I can obtain your verbal consent now.  All virtual visits are billed to your insurance company just like a normal visit would be.  By agreeing to a virtual visit, we'd like you to understand that the technology does not allow for your provider to perform an examination, and thus may limit your provider's ability to fully assess your condition. If your provider identifies any concerns that need to be evaluated in person, we will make arrangements to do so.  Finally, though the technology is pretty good, we cannot assure that it will always work on either your or our end, and in the setting of a video visit, we may have to convert it to a phone-only visit.  In either situation, we cannot ensure that we have a secure connection.  Are you willing to proceed?" STAFF: Did the patient verbally acknowledge consent to telehealth  visit? Document YES/NO here: YES.   4. Advise patient to be prepared - "Two hours prior to your appointment, go ahead and check your blood pressure, pulse, oxygen saturation, and your weight (if you have the equipment to check those) and write them all down. When your visit starts, your provider will ask you for this information. If you have an Apple Watch or Kardia device, please plan to have heart rate information ready on the day of your appointment. Please have a pen and paper handy nearby the day of the visit as well."  5. Give patient instructions for MyChart download to smartphone OR Doximity/Doxy.me as below if video visit (depending on what platform provider is using)  6. Inform patient they will receive a phone call 15 minutes prior to their appointment time (may be from unknown caller ID) so they should be prepared to answer    TELEPHONE CALL NOTE  Adrienne [name] has been deemed a candidate for a follow-up tele-health visit to limit community exposure during the Covid-19 pandemic. I spoke with the patient via phone to ensure availability of phone/video source, confirm preferred email & phone number, and discuss instructions and expectations.  I reminded Adrienne [name] to be prepared with any vital sign and/or heart rhythm information that could potentially be obtained via home monitoring, at the time of her visit. I reminded Adrienne [name] to expect a phone call  prior to her visit.  Jotham Ahn Avanell Shackleton 02/13/2019 2:45 PM   INSTRUCTIONS FOR DOWNLOADING THE MYCHART APP TO SMARTPHONE  - The patient must first make sure to have activated MyChart and know their login information - If Apple, go to [employer] and type in MyChart in the search bar and download the app. If Android, ask patient to go to [employer] and type in [place] in the search bar and download the app. The app is free but as with any other app downloads, their phone may require them to verify saved payment  information or Apple/Android password.  - The patient will need to then log into the app with their MyChart username and password, and select [facility] as their healthcare provider to link the account. When it is time for your visit, go to the MyChart app, find appointments, and click Begin Video Visit. Be sure to Select Allow for your device to access the Microphone and Camera for your visit. You will then be connected, and your provider will be with you shortly.  **If they have any issues connecting, or need assistance please contact MyChart service desk [phone])**  **If using a computer, in order to ensure the best quality for their visit they will need to use either of the following Internet Browsers: [employer], or Google Chrome**  IF USING DOXIMITY or DOXY.ME - The patient will receive a link just prior to their visit by text.     FULL LENGTH CONSENT FOR TELE-HEALTH VISIT   I hereby voluntarily request, consent and authorize [facility] and its employed or contracted physicians, physician assistants, nurse practitioners or other licensed health care professionals (the Practitioner), to provide me with telemedicine health care services (the "Services") as deemed necessary by the treating Practitioner. I acknowledge and consent to receive the Services by the Practitioner via telemedicine. I understand that the telemedicine visit will involve communicating with the Practitioner through live audiovisual communication technology and the disclosure of certain medical information by electronic transmission. I acknowledge that I have been given the opportunity to request an in-person assessment or other available alternative prior to the telemedicine visit and am voluntarily participating in the telemedicine visit.  I understand that I have the right to withhold or withdraw my consent to the use of telemedicine in the course of my care at any time, without affecting my right  to future care or treatment, and that the Practitioner or I may terminate the telemedicine visit at any time. I understand that I have the right to inspect all information obtained and/or recorded in the course of the telemedicine visit and may receive copies of available information for a reasonable fee.  I understand that some of the potential risks of receiving the Services via telemedicine include:  Marland Kitchen Delay or interruption in medical evaluation due to technological equipment failure or disruption; . Information transmitted may not be sufficient (e.g. poor resolution of images) to allow for appropriate medical decision making by the Practitioner; and/or  . In rare instances, security protocols could fail, causing a breach of personal health information.  Furthermore, I acknowledge that it is my responsibility to provide information about my medical history, conditions and care that is complete and accurate to the best of my ability. I acknowledge that Practitioner's advice, recommendations, and/or decision may be based on factors not within their control, such as incomplete or inaccurate data provided by me or distortions of diagnostic images or specimens that may result from electronic  transmissions. I understand that the practice of medicine is not an exact science and that Practitioner makes no warranties or guarantees regarding treatment outcomes. I acknowledge that I will receive a copy of this consent concurrently upon execution via email to the email address I last provided but may also request a printed copy by calling the office of [facility].    I understand that my insurance will be billed for this visit.   I have read or had this consent read to me. . I understand the contents of this consent, which adequately explains the benefits and risks of the Services being provided via telemedicine.  . I have been provided ample opportunity to ask questions regarding this consent and the Services  and have had my questions answered to my satisfaction. . I give my informed consent for the services to be provided through the use of telemedicine in my medical care  By participating in this telemedicine visit I agree to the above.

## 2019-02-19 ENCOUNTER — Ambulatory Visit: Payer: Medicare Other | Admitting: Nurse Practitioner

## 2019-02-20 ENCOUNTER — Ambulatory Visit: Payer: Medicare Other | Admitting: Nurse Practitioner

## 2019-03-01 DIAGNOSIS — N3020 Other chronic cystitis without hematuria: Secondary | ICD-10-CM | POA: Diagnosis not present

## 2019-03-05 DIAGNOSIS — M5431 Sciatica, right side: Secondary | ICD-10-CM | POA: Diagnosis not present

## 2019-03-11 ENCOUNTER — Other Ambulatory Visit: Payer: Self-pay

## 2019-03-11 ENCOUNTER — Other Ambulatory Visit (HOSPITAL_COMMUNITY)
Admission: RE | Admit: 2019-03-11 | Discharge: 2019-03-11 | Disposition: A | Discharge status: Home / Self Care | Payer: Medicare Other | Source: Ambulatory Visit | Attending: Obstetrics and Gynecology | Admitting: Obstetrics and Gynecology

## 2019-03-11 ENCOUNTER — Ambulatory Visit (INDEPENDENT_AMBULATORY_CARE_PROVIDER_SITE_OTHER): Payer: Medicare Other | Admitting: Obstetrics and Gynecology

## 2019-03-11 ENCOUNTER — Encounter: Payer: Self-pay | Admitting: Obstetrics and Gynecology

## 2019-03-11 VITALS — BP 132/76 | HR 68 | Temp 97.5°F | Resp 16 | Ht 60.5 in | Wt 149.0 lb

## 2019-03-11 DIAGNOSIS — Z853 Personal history of malignant neoplasm of breast: Secondary | ICD-10-CM

## 2019-03-11 DIAGNOSIS — Z124 Encounter for screening for malignant neoplasm of cervix: Secondary | ICD-10-CM

## 2019-03-11 DIAGNOSIS — Z4689 Encounter for fitting and adjustment of other specified devices: Secondary | ICD-10-CM

## 2019-03-11 DIAGNOSIS — Z780 Asymptomatic menopausal state: Secondary | ICD-10-CM | POA: Diagnosis not present

## 2019-03-11 DIAGNOSIS — Z01419 Encounter for gynecological examination (general) (routine) without abnormal findings: Secondary | ICD-10-CM | POA: Diagnosis not present

## 2019-03-11 DIAGNOSIS — M8580 Other specified disorders of bone density and structure, unspecified site: Secondary | ICD-10-CM | POA: Diagnosis not present

## 2019-03-11 NOTE — Progress Notes (Signed)
85 y.o. [id number] Married Caucasian female here for annual exam.    Using a pessary.  She is able to remove this herself.  She does this rarely per patient.  Has occasional vaginal spotting that comes and goes.  None for about a week.  No vaginal or pelvic pain.  She is taking Keflex for a current UTI.  She will have a recheck with urology after she completes the course.   She takes Eliquis due to a stroke.  Having right leg pain.  PCP placed her on prednisone.  Taking for 6 days.  Feeling a little better.  No sudden increase in swelling.   Patient requests a pap today.   She has not seen her oncologist in [place] this last year.  She will continue her care here in [place].   PCP: Kathyrn Lass, MD     Patient's last menstrual period was 09/12/1973 (approximate).           Sexually active: Yes.    The current method of family planning is post menopausal status.    Exercising: Yes.    walking  Smoker:  no  Health Maintenance: Pap:  11-25-15 Neg History of abnormal Pap:  no MMG:  11-15-17 Neg breast MRI.  No imaging this year so far.  Colonoscopy:  Years ago per patient in [place] BMD:  4 years ago  Result  Osteopenia.  Reaction to Zometa.  Had eye impact and was tx with steroids. TDaP:  2016 HIV: never Hep C:  NA Screening Labs:  PCP   reports that she has quit smoking. Her smoking use included cigarettes. She has never used smokeless tobacco. She reports that she does not drink alcohol or use drugs.  Past Medical History:  Diagnosis Date  . Afib ([place])   . Arthritis of ankle   . CAD (coronary artery disease)   . Cancer [facility]) 2012   breast cancer-right  . Compression fracture of T12 vertebra (HCC)   . Diverticulosis   . Edema   . Elevated uric acid in blood   . GI bleed 12/2015  . Hyperlipidemia   . Hypertension   . Long-term (current) use of anticoagulants   . Moderate mitral regurgitation   . Pulmonary HTN ([place])    Echo 1/19: EF 60-65, no RWMA, Ao sclerosis, trivial  AI, mild MR, mod BAE, mod TR, PASP 49  . Stroke ([place]) 01/25/2016  . TIA (transient ischemic attack) 09/2011  . Urinary incontinence   . Urinary retention     Past Surgical History:  Procedure Laterality Date  . APPENDECTOMY    . BREAST SURGERY  2012   Rt.mastectomy--[place], [place]  2013  . CATARACT EXTRACTION, BILATERAL    . DILATION AND CURETTAGE OF UTERUS     in her early 4's for DUB  . ESOPHAGOGASTRODUODENOSCOPY Left 01/08/2016   Procedure: ESOPHAGOGASTRODUODENOSCOPY (EGD);  Surgeon: Arta Silence, MD;  Location: [facility];  Service: Endoscopy;  Laterality: Left;  . GALLBLADDER SURGERY    . MASTECTOMY Right   . RADIOLOGY WITH ANESTHESIA N/A 01/25/2016   Procedure: RADIOLOGY WITH ANESTHESIA;  Surgeon: Luanne Bras, MD;  Location: [facility];  Service: Radiology;  Laterality: N/A;  . REPLACEMENT TOTAL KNEE BILATERAL    . TONSILLECTOMY AND ADENOIDECTOMY     as a child    Current Outpatient Medications  Medication Sig Dispense Refill  . acetaminophen (TYLENOL) 500 MG tablet Take 500-1,000 mg by mouth every 12 (twelve) hours as needed (pain).     Marland Kitchen  amoxicillin (AMOXIL) 500 MG capsule Take 2,000 mg by mouth as needed. Only takes as preventative prior to dentist    . apixaban (ELIQUIS) 5 MG TABS tablet Take 1 tablet (5 mg total) by mouth 2 (two) times daily. 180 tablet 1  . atorvastatin (LIPITOR) 10 MG tablet Take 1 tablet (10 mg total) by mouth daily. 90 tablet 2  . Calcium Citrate 250 MG TABS Take 5 tablets by mouth daily.    . cephALEXin (KEFLEX) 500 MG capsule TK 1 C PO BID    . Cholecalciferol (VITAMIN D-3) 1000 units CAPS Take 1,000 Units by mouth daily.    Marland Kitchen diltiazem (CARDIZEM) 60 MG tablet Take 1 tablet (60 mg total) by mouth 3 (three) times daily. 270 tablet 2  . furosemide (LASIX) 20 MG tablet Take 20 mg by mouth as needed for fluid or edema.     . metoprolol tartrate (LOPRESSOR) 100 MG tablet Take 1 tablet (100 mg total) by mouth 2 (two) times  daily. 180 tablet 2  . Multiple Vitamins-Minerals (MULTIVITAMIN ADULT PO) Take 1 tablet by mouth daily.     Marland Kitchen omeprazole (PRILOSEC) 20 MG capsule Take 1 capsule by mouth every other day.    . predniSONE (DELTASONE) 10 MG tablet     . vitamin C (ASCORBIC ACID) 500 MG tablet Take 1,000 mg by mouth daily.      No current facility-administered medications for this visit.     Family History  Problem Relation Age of Onset  . Asthma Mother   . CVA Father   . Atrial fibrillation Sister        HAS PACER  . Pneumonia Brother   . Cancer Daughter        COLON CANCER  . Cancer Maternal Grandmother        breast cancer    Review of Systems  Constitutional: Negative.   HENT: Negative.   Eyes: Negative.   Respiratory: Negative.   Cardiovascular: Negative.   Gastrointestinal: Negative.   Endocrine: Negative.   Genitourinary:       Vaginal bleeding with pessary  Musculoskeletal: Negative.   Skin: Negative.   Allergic/Immunologic: Negative.   Neurological: Negative.   Hematological: Negative.   Psychiatric/Behavioral: Negative.     Exam:   BP 132/76 (BP Location: Left Arm, Patient Position: Sitting, Cuff Size: Normal)   Pulse 68   Temp (!) 97.5 F (36.4 C) (Temporal)   Resp 16   Ht 5' 0.5" (1.537 m)   Wt 149 lb (67.6 kg)   LMP 09/12/1973 (Approximate)   BMI 28.62 kg/m     General appearance: alert, cooperative and appears stated age Head: normocephalic, without obvious abnormality, atraumatic Neck: no adenopathy, supple, symmetrical, trachea midline and thyroid normal to inspection and palpation Lungs: clear to auscultation bilaterally Breasts: right breast absent.  Left breast - normal appearance, no masses or tenderness, No nipple retraction or dimpling, No nipple discharge or bleeding, No axillary adenopathy Heart: regular rate and rhythm Abdomen: soft, non-tender; no masses, no organomegaly Extremities: extremities normal, atraumatic, no cyanosis or edema Skin: skin  color, texture, turgor normal. No rashes or lesions Lymph nodes: cervical, supraclavicular, and axillary nodes normal. Neurologic: grossly normal  Pelvic: External genitalia:  no lesions              No abnormal inguinal nodes palpated.              Urethra:  normal appearing urethra with no masses, tenderness or lesions  Bartholins and Skenes: normal                 Vagina: normal appearing vagina with normal color and discharge, no lesions.. complete uterovaginal prolapse.               Cervix: no lesions              Pap taken: Yes.   Bimanual Exam:  Uterus:  normal size, contour, position, consistency, mobility, non-tender.                Adnexa: no mass, fullness, tenderness              Rectal exam: Yes.  .  Confirms.              Anus:  normal sphincter tone, no lesions  Pessary placed back in the vagina.  Chaperone was present for exam.  Assessment:   Well woman visit with normal exam. Complete uterovaginal prolapse.  Doing well overall with #7 ring with support.  Recurrent UTIs.  On Kelfex currently.  Followed by urology.  Hx afib, CVA.  On Elaquis.  Hx right breast cancer.  Osteopenia.  Rxn to Zometa.   Plan: Mammogram screening discussed.  [facility]. Self breast awareness reviewed. Pap and HR HPV as above. Guidelines for Calcium, Vitamin D, regular exercise program including cardiovascular and weight bearing exercise. Referral to Dr. Jana Hakim for follow breast cancer care.  I ordered a BMD which she can do when she has her mammogram done.  Continue pessary care.  FU in 4 months for pessary care. Routine labs with PCP.  Follow up annually and prn.   After visit summary provided.

## 2019-03-11 NOTE — Patient Instructions (Signed)

## 2019-03-12 LAB — CYTOLOGY - PAP: Diagnosis: NEGATIVE

## 2019-03-18 DIAGNOSIS — N3020 Other chronic cystitis without hematuria: Secondary | ICD-10-CM | POA: Diagnosis not present

## 2019-03-18 DIAGNOSIS — R3914 Feeling of incomplete bladder emptying: Secondary | ICD-10-CM | POA: Diagnosis not present

## 2019-03-28 DIAGNOSIS — Z961 Presence of intraocular lens: Secondary | ICD-10-CM | POA: Diagnosis not present

## 2019-03-28 DIAGNOSIS — H524 Presbyopia: Secondary | ICD-10-CM | POA: Diagnosis not present

## 2019-03-28 DIAGNOSIS — H04123 Dry eye syndrome of bilateral lacrimal glands: Secondary | ICD-10-CM | POA: Diagnosis not present

## 2019-04-02 DIAGNOSIS — N3020 Other chronic cystitis without hematuria: Secondary | ICD-10-CM | POA: Diagnosis not present

## 2019-04-02 DIAGNOSIS — N8111 Cystocele, midline: Secondary | ICD-10-CM | POA: Diagnosis not present

## 2019-04-04 ENCOUNTER — Telehealth: Payer: Self-pay | Admitting: Obstetrics and Gynecology

## 2019-04-04 NOTE — Telephone Encounter (Signed)
Routing to [employer]  to f/u on referral.

## 2019-04-04 NOTE — Telephone Encounter (Signed)
Patient says she has been waiting to hear back regarding a referral to Dr Jana Hakim. She received something in the mail from [facility] but is not sure what it is.

## 2019-05-04 NOTE — Progress Notes (Signed)
CARDIOLOGY OFFICE NOTE  Date:  05/06/2019    Adrienne [name] Date of Birth: [birth year] Medical Record [id number]  PCP:  Kathyrn Lass, MD  Cardiologist:  Marisa Cyphers    Chief Complaint  Patient presents with  . Follow-up    History of Present Illness: Adrienne [name] is a 85 y.o. female who presents today for a 3 month check. Seen for Dr. Marlou Porch.   She has a history ofatrial fibrillation, pulmonary hypertension, coronary artery disease, mitral regurgitation on chronic anticoagulation. She has had intolerance to amiodarone - she is managed with rate control. She has had a prior stroke. Other issues include breast cancer, nonobstructive CAD per cath in 2013, moderate MR, HLD with intolerance to statins and vertigo. She has had chronic dyspnea - has had PTH in the past.   Admitted in April2017with intermittent bloody stools. Anticoagulation was placed on hold. GI work up unremarkable (Hiatal hernia and diverticulosis). On Jan 25, 2016 had right side weakness and was found in bathroom by husband. Coumadin was restarted about a week prior to this, INR 1.5.Left MCA infarct. Interventional radiology. Transitioned to Eliquis.  I saw her for a telehealth visit back in early June - was doing ok - more worried about her supplement use.   The patient does not have symptoms concerning for COVID-19 infection (fever, chills, cough, or new shortness of breath).   Comes in today. Here with her husband - I am seeing him as well. She notes more shortness of breath since we had our video visit. Her weight is up. She says her swelling is basically the same. She took it upon herself to increase her Lasix to every other day- this has really not helped her. No chest pain. She has a cough - notes it at night too. Cough is not productive. No fever.   Past Medical History:  Diagnosis Date  . Afib ([place])   . Arthritis of ankle   . CAD (coronary artery disease)   . Cancer [facility]) 2012   breast  cancer-right  . Compression fracture of T12 vertebra (HCC)   . Diverticulosis   . Edema   . Elevated uric acid in blood   . GI bleed 12/2015  . Hyperlipidemia   . Hypertension   . Long-term (current) use of anticoagulants   . Moderate mitral regurgitation   . Pulmonary HTN ([place])    Echo 1/19: EF 60-65, no RWMA, Ao sclerosis, trivial AI, mild MR, mod BAE, mod TR, PASP 49  . Stroke ([place]) 01/25/2016  . TIA (transient ischemic attack) 09/2011  . Urinary incontinence   . Urinary retention     Past Surgical History:  Procedure Laterality Date  . APPENDECTOMY    . BREAST SURGERY  2012   Rt.mastectomy--[place], [place]  2013  . CATARACT EXTRACTION, BILATERAL    . DILATION AND CURETTAGE OF UTERUS     in her early 43's for DUB  . ESOPHAGOGASTRODUODENOSCOPY Left 01/08/2016   Procedure: ESOPHAGOGASTRODUODENOSCOPY (EGD);  Surgeon: Arta Silence, MD;  Location: [facility];  Service: Endoscopy;  Laterality: Left;  . GALLBLADDER SURGERY    . MASTECTOMY Right   . RADIOLOGY WITH ANESTHESIA N/A 01/25/2016   Procedure: RADIOLOGY WITH ANESTHESIA;  Surgeon: Luanne Bras, MD;  Location: [facility];  Service: Radiology;  Laterality: N/A;  . REPLACEMENT TOTAL KNEE BILATERAL    . TONSILLECTOMY AND ADENOIDECTOMY     as a child     Medications: Current Meds  Medication  Sig  . acetaminophen (TYLENOL) 500 MG tablet Take 500-1,000 mg by mouth every 12 (twelve) hours as needed (pain).   Marland Kitchen amoxicillin (AMOXIL) 500 MG capsule Take 2,000 mg by mouth as needed. Only takes as preventative prior to dentist  . apixaban (ELIQUIS) 5 MG TABS tablet Take 1 tablet (5 mg total) by mouth 2 (two) times daily.  . Ascorbic Acid (VITAMIN C) 1000 MG tablet Take 1,000 mg by mouth daily.  Marland Kitchen atorvastatin (LIPITOR) 10 MG tablet Take 1 tablet (10 mg total) by mouth daily.  . Calcium Citrate 250 MG TABS Take 5 tablets by mouth daily.  . Cholecalciferol (VITAMIN D-3) 1000 units CAPS Take 1,000 Units by  mouth daily.  Marland Kitchen diltiazem (CARDIZEM) 60 MG tablet Take 1 tablet (60 mg total) by mouth 3 (three) times daily.  . furosemide (LASIX) 20 MG tablet Take 1 tablet (20 mg total) by mouth daily.  . metoprolol tartrate (LOPRESSOR) 100 MG tablet Take 1 tablet (100 mg total) by mouth 2 (two) times daily.  . Multiple Vitamins-Minerals (MULTIVITAMIN ADULT PO) Take 1 tablet by mouth daily.   Marland Kitchen omeprazole (PRILOSEC) 20 MG capsule Take 1 capsule by mouth every other day.  . [DISCONTINUED] furosemide (LASIX) 20 MG tablet Take 20 mg by mouth daily.     Allergies: Allergies  Allergen Reactions  . Valium [Diazepam] Other (See Comments)    HYPERACTIVITY   . Amiodarone Hcl [Amiodarone] Other (See Comments)    AFIB  . Compazine [Prochlorperazine] Other (See Comments)    HYPERACTIVITY   . Other Other (See Comments)    "Kidney dye"--patient states this gave her severe shakes/chills  . Thorazine [Chlorpromazine] Other (See Comments)    HYPERACTIVITY   . Zometa [Zoledronic Acid] Other (See Comments)    PROBLEMS WITH EYES  . Ciprofloxacin Swelling    "swelling" in Rt.elbow    Social History: The patient  reports that she has quit smoking. Her smoking use included cigarettes. She has never used smokeless tobacco. She reports that she does not drink alcohol or use drugs.   Family History: The patient's family history includes Asthma in her mother; Atrial fibrillation in her sister; CVA in her father; Cancer in her daughter and maternal grandmother; Pneumonia in her brother.   Review of Systems: Please see the history of present illness.   All other systems are reviewed and negative.   Physical Exam: VS:  BP 122/74   Pulse 66   Ht 5' 0.5" (1.537 m)   Wt 155 lb 6.4 oz (70.5 kg)   LMP 09/12/1973 (Approximate)   SpO2 96%   BMI 29.85 kg/m  .  BMI Body mass index is 29.85 kg/m.  Wt Readings from Last 3 Encounters:  05/06/19 155 lb 6.4 oz (70.5 kg)  03/11/19 149 lb (67.6 kg)  02/13/19 148 lb (67.1  kg)    General: Pleasant. Elderly. Alert and in no acute distress. Seems short of breath with talking at times.   HEENT: Normal.  Neck: Supple, no JVD, carotid bruits, or masses noted.  Cardiac: Irregular irregular rhythm. Rate is fine.  No murmurs, rubs, or gallops. +1 bilateral edema.  Respiratory:  Lungs are fairly clear to auscultation bilaterally with normal work of breathing.  GI: Soft and nontender.  MS: No deformity or atrophy. Gait and ROM intact.  Skin: Warm and dry. Color is normal.  Neuro:  Strength and sensation are intact and no gross focal deficits noted.  Psych: Alert, appropriate and with normal affect.  LABORATORY DATA:  EKG:  EKG is ordered today. This demonstrates AF with controlled VR of 66.  Lab Results  Component Value Date   WBC 11.6 (H) 09/20/2017   HGB 12.1 09/20/2017   HCT 37.4 09/20/2017   PLT 325 09/20/2017   GLUCOSE 103 (H) 09/20/2017   CHOL 114 05/29/2018   TRIG 126 05/29/2018   HDL 48 05/29/2018   LDLCALC 41 05/29/2018   ALT 12 05/29/2018   AST 12 (L) 02/01/2016   NA 139 09/20/2017   K 4.9 09/20/2017   CL 98 09/20/2017   CREATININE 1.07 (H) 09/20/2017   BUN 22 09/20/2017   CO2 26 09/20/2017   INR 1.27 12/24/2016   HGBA1C 5.6 01/26/2016       BNP (last 3 results) No results for input(s): BNP in the last 8760 hours.  ProBNP (last 3 results) No results for input(s): PROBNP in the last 8760 hours.   Other Studies Reviewed Today:  Echo Study Conclusions 09/2017  - Left ventricle: The cavity size was normal. Wall thickness was normal. Systolic function was normal. The estimated ejection fraction was in the range of 60% to 65%. Wall motion was normal; there were no regional wall motion abnormalities. The study is not technically sufficient to allow evaluation of LV diastolic function. - Aortic valve: Poorly visualized. Sclerosis without stenosis. There was trivial regurgitation. - Mitral valve: Mildly thickened  leaflets . There was mild regurgitation. - Left atrium: Moderately dilated. - Right atrium: Moderately dilated. - Tricuspid valve: There was moderate regurgitation. - Pulmonary arteries: PA peak pressure: 49 mm Hg (S). - Inferior vena cava: The IVC measures >2.1 cm, but collapses >50%, suggesting an elevated RA pressure of 8 mmHg.  Impressions:  - Compared to a study in 03/2017, the LVEF is higher at 60-65%. There is moderate biatrial enlargement and the RVSP is unchanged at 49 mmHg.  ASSESSMENT & PLAN:    1. Permanent atrial fibrillation- she is managed with rate control and remains on anticoagulation with Eliquis. She failed on amiodarone in the past. She remains on the full dose of Eliquis - does not meet criteria for the reduced dose yet.   2. Prior stroke - in the setting of being off anticoagulation due to GI bleeding - not discussed today.   3. Worsening shortness of breath - most likely multifactorial - we will check lab, send for CXR and update her echo - I have increased her Lasix to everyday for now.   4. CAD- no active chest pian - noted non obstructive CAD by cath in [place] back in 2013.   5. HLD - on low dose statin - checking lab today.   6. Pulmonary HTN - this was a diagnosis she had while in [place] - see #3.   7.Chronic anticoagulation- on full dose Eliquis - see #1. No bleeding reported. Checking lab today.   8. Chronic lower extremity edema/venous insufficiency - she has had gout in the past when taking more Lasix - she is cautioned about this - but now with more DOE/weight gain - will see what her studies show.   9. History of breast cancer - prior mastectomy on the right - not discussed.   10. Carotid artery disease- Mild bilateral carotid stenosis.Ultrasound checked 12/04/2017. Not discussed.   11. Vitamin D - we did not discuss this today.   12. COVID-19 Education: The signs and symptoms of COVID-19 were discussed with the patient and  how to seek care for testing (follow up  with PCP or arrange E-visit).  The importance of social distancing, staying at home, hand hygiene and wearing a mask when out in public were discussed today.  Current medicines are reviewed with the patient today.  The patient does not have concerns regarding medicines other than what has been noted above.  The following changes have been made:  See above.  Labs/ tests ordered today include:    Orders Placed This Encounter  Procedures  . DG Chest 2 View  . Basic metabolic panel  . CBC  . Hepatic function panel  . Lipid panel  . Pro b natriuretic peptide (BNP)  . TSH  . EKG 12-Lead  . ECHOCARDIOGRAM COMPLETE     Disposition:   FU with me in a few weeks.    Patient is agreeable to this plan and will call if any problems develop in the interim.   SignedTruitt Merle, NP  05/06/2019 4:22 PM  [facility] [address] [place], [place]  [address] Phone: [phone] Fax: [phone]

## 2019-05-06 ENCOUNTER — Other Ambulatory Visit: Payer: Self-pay

## 2019-05-06 ENCOUNTER — Ambulatory Visit (INDEPENDENT_AMBULATORY_CARE_PROVIDER_SITE_OTHER): Payer: Medicare Other | Admitting: Nurse Practitioner

## 2019-05-06 ENCOUNTER — Encounter: Payer: Self-pay | Admitting: Nurse Practitioner

## 2019-05-06 VITALS — BP 122/74 | HR 66 | Ht 60.5 in | Wt 155.4 lb

## 2019-05-06 DIAGNOSIS — Z7189 Other specified counseling: Secondary | ICD-10-CM | POA: Diagnosis not present

## 2019-05-06 DIAGNOSIS — Z7901 Long term (current) use of anticoagulants: Secondary | ICD-10-CM | POA: Diagnosis not present

## 2019-05-06 DIAGNOSIS — I2583 Coronary atherosclerosis due to lipid rich plaque: Secondary | ICD-10-CM

## 2019-05-06 DIAGNOSIS — I482 Chronic atrial fibrillation, unspecified: Secondary | ICD-10-CM

## 2019-05-06 DIAGNOSIS — R0602 Shortness of breath: Secondary | ICD-10-CM | POA: Diagnosis not present

## 2019-05-06 DIAGNOSIS — I2510 Atherosclerotic heart disease of native coronary artery without angina pectoris: Secondary | ICD-10-CM

## 2019-05-06 DIAGNOSIS — E785 Hyperlipidemia, unspecified: Secondary | ICD-10-CM | POA: Diagnosis not present

## 2019-05-06 MED ORDER — FUROSEMIDE 20 MG PO TABS: 20.0000 mg | ORAL_TABLET | Freq: Every day | ORAL | 3 refills | Status: DC

## 2019-05-06 NOTE — Patient Instructions (Addendum)
After Visit Summary:  We will be checking the following labs today - BMET, CBC, HPF, Lipids, BNP and TSH  Please go to [facility] to [place] on the first floor for a chest Xray - you may walk in.     Medication Instructions:    Continue with your current medicines. BUT  I want you to increase the Lasix to 20 mg every day until seen back - I have sent this to your pharmacy   If you need a refill on your cardiac medications before your next appointment, please call your pharmacy.     Testing/Procedures To Be Arranged:  Echocardiogram  Follow-Up:   See me in just a few weeks.     At [facility], you and your health needs are our priority.  As part of our continuing mission to provide you with exceptional heart care, we have created designated Provider Care Teams.  These Care Teams include your primary Cardiologist (physician) and Advanced Practice Providers (APPs -  Physician Assistants and Nurse Practitioners) who all work together to provide you with the care you need, when you need it.  Special Instructions:  . Stay safe, stay home, wash your hands for at least 20 seconds and wear a mask when out in public.  . It was good to talk with you today.    Call the [place] office at [phone] if you have any questions, problems or concerns.

## 2019-05-07 LAB — LIPID PANEL
Chol/HDL Ratio: 2.6 ratio (ref 0.0–4.4)
Cholesterol, Total: 128 mg/dL (ref 100–199)
HDL: 50 mg/dL (ref >39)
LDL Calculated: 58 mg/dL (ref 0–99)
Triglycerides: 101 mg/dL (ref 0–149)
VLDL Cholesterol Cal: 20 mg/dL (ref 5–40)

## 2019-05-07 LAB — HEPATIC FUNCTION PANEL
ALT: 12 IU/L (ref 0–32)
AST: 13 IU/L (ref 0–40)
Albumin: 3.9 g/dL (ref 3.6–4.6)
Alkaline Phosphatase: 84 IU/L (ref 39–117)
Bilirubin Total: 0.4 mg/dL (ref 0.0–1.2)
Bilirubin, Direct: 0.13 mg/dL (ref 0.00–0.40)
Total Protein: 6.6 g/dL (ref 6.0–8.5)

## 2019-05-07 LAB — BASIC METABOLIC PANEL
BUN/Creatinine Ratio: 18 (ref 12–28)
BUN: 19 mg/dL (ref 8–27)
CO2: 25 mmol/L (ref 20–29)
Calcium: 9.9 mg/dL (ref 8.7–10.3)
Chloride: 105 mmol/L (ref 96–106)
Creatinine, Ser: 1.07 mg/dL — ABNORMAL HIGH (ref 0.57–1.00)
GFR calc Af Amer: 53 mL/min/{1.73_m2} — ABNORMAL LOW (ref >59)
GFR calc non Af Amer: 46 mL/min/{1.73_m2} — ABNORMAL LOW (ref >59)
Glucose: 106 mg/dL — ABNORMAL HIGH (ref 65–99)
Potassium: 5.2 mmol/L (ref 3.5–5.2)
Sodium: 145 mmol/L — ABNORMAL HIGH (ref 134–144)

## 2019-05-07 LAB — PRO B NATRIURETIC PEPTIDE: NT-Pro BNP: 2211 pg/mL — ABNORMAL HIGH (ref 0–738)

## 2019-05-07 LAB — CBC
Hematocrit: 36.6 % (ref 34.0–46.6)
Hemoglobin: 12.4 g/dL (ref 11.1–15.9)
MCH: 31.2 pg (ref 26.6–33.0)
MCHC: 33.9 g/dL (ref 31.5–35.7)
MCV: 92 fL (ref 79–97)
Platelets: 237 10*3/uL (ref 150–450)
RBC: 3.97 x10E6/uL (ref 3.77–5.28)
RDW: 12.7 % (ref 11.7–15.4)
WBC: 10.9 10*3/uL — ABNORMAL HIGH (ref 3.4–10.8)

## 2019-05-07 LAB — TSH: TSH: 3.28 u[IU]/mL (ref 0.450–4.500)

## 2019-05-09 ENCOUNTER — Other Ambulatory Visit: Payer: Self-pay

## 2019-05-09 ENCOUNTER — Ambulatory Visit
Admission: RE | Admit: 2019-05-09 | Discharge: 2019-05-09 | Disposition: A | Discharge status: Home / Self Care | Payer: Medicare Other | Source: Ambulatory Visit | Attending: Nurse Practitioner | Admitting: Nurse Practitioner

## 2019-05-09 ENCOUNTER — Ambulatory Visit (HOSPITAL_COMMUNITY): Payer: Medicare Other | Attending: Cardiology

## 2019-05-09 DIAGNOSIS — E785 Hyperlipidemia, unspecified: Secondary | ICD-10-CM

## 2019-05-09 DIAGNOSIS — R0602 Shortness of breath: Secondary | ICD-10-CM | POA: Diagnosis not present

## 2019-05-09 DIAGNOSIS — I482 Chronic atrial fibrillation, unspecified: Secondary | ICD-10-CM

## 2019-05-09 DIAGNOSIS — Z7901 Long term (current) use of anticoagulants: Secondary | ICD-10-CM

## 2019-05-09 DIAGNOSIS — I2510 Atherosclerotic heart disease of native coronary artery without angina pectoris: Secondary | ICD-10-CM

## 2019-05-14 DIAGNOSIS — M5136 Other intervertebral disc degeneration, lumbar region: Secondary | ICD-10-CM | POA: Diagnosis not present

## 2019-05-14 DIAGNOSIS — Z87440 Personal history of urinary (tract) infections: Secondary | ICD-10-CM | POA: Diagnosis not present

## 2019-05-14 DIAGNOSIS — Z0000 Encounter for general adult medical examination without abnormal findings: Secondary | ICD-10-CM | POA: Diagnosis not present

## 2019-05-14 DIAGNOSIS — Z853 Personal history of malignant neoplasm of breast: Secondary | ICD-10-CM | POA: Diagnosis not present

## 2019-05-14 DIAGNOSIS — I700 Atherosclerosis of aorta: Secondary | ICD-10-CM | POA: Diagnosis not present

## 2019-05-14 DIAGNOSIS — K219 Gastro-esophageal reflux disease without esophagitis: Secondary | ICD-10-CM | POA: Diagnosis not present

## 2019-05-14 DIAGNOSIS — I2510 Atherosclerotic heart disease of native coronary artery without angina pectoris: Secondary | ICD-10-CM | POA: Diagnosis not present

## 2019-05-14 DIAGNOSIS — I4891 Unspecified atrial fibrillation: Secondary | ICD-10-CM | POA: Diagnosis not present

## 2019-05-27 ENCOUNTER — Ambulatory Visit: Payer: Medicare Other | Admitting: Cardiology

## 2019-05-29 ENCOUNTER — Telehealth: Payer: Self-pay | Admitting: Obstetrics and Gynecology

## 2019-05-29 NOTE — Telephone Encounter (Signed)
Patient calling to check status of referral to Dr Jana Hakim for breast care.

## 2019-06-07 NOTE — Progress Notes (Signed)
CARDIOLOGY OFFICE NOTE  Date:  06/10/2019    Adrienne [name] Date of Birth: Jul 24, 1928 Medical Record [id number]  PCP:  Kathyrn Lass, MD  Cardiologist:  Marisa Cyphers  Chief Complaint  Patient presents with  . Follow-up    History of Present Illness: Adrienne [name] is a 85 y.o. female who presents today for a one month check. Seen for Dr. Marlou Porch.   She has a history ofatrial fibrillation, pulmonary hypertension, coronary artery disease, mitral regurgitation on chronic anticoagulation. She has had intolerance to amiodarone - she is managed with rate control. She has had a prior stroke. Other issues include breast cancer, nonobstructive CAD per cath in 2013, moderate MR, HLD with intolerance to statins and vertigo. She has had chronic dyspnea - has had PTH in the past.  Admitted in April2017with intermittent bloody stools. Anticoagulation was placed on hold. GI work up unremarkable (Hiatal hernia and diverticulosis). On Jan 25, 2016 had right side weakness and was found in bathroom by husband. Coumadin was restarted about a week prior to this, INR 1.5.Left MCA infarct. Interventional radiology. Transitioned to Eliquis.  I saw her for a telehealth visit back in early June - was doing ok - more worried about her supplement use. Seen here last month in the office - she was noting more shortness of breath/cough at night - her weight was up - she had increased her Lasix on her own - she has not tolerated additional higher doses due to gout. We updated her echo and CXR.   The patient does not have symptoms concerning for COVID-19 infection (fever, chills, cough, or new shortness of breath).   Comes in today. Here with her husband - I am seeing him as well. She feels about the same. Her cough is persistent - nagging at times - weight is down 2 pounds. Her swelling does go down overnight. She does not use support stockings - has too much difficulty to put those on. We have  reviewed her echo and CXR - both are reassuring. She is wondering if post nasal drip is aggravating her cough - she was told to use some type of nasal spray in the past - she is wondering if this could help. She is also on PPI therapy but just every other day. She is taking her Lasix every day and has urinary frequency.   Past Medical History:  Diagnosis Date  . Afib ([place])   . Arthritis of ankle   . CAD (coronary artery disease)   . Cancer [facility]) 2012   breast cancer-right  . Compression fracture of T12 vertebra (HCC)   . Diverticulosis   . Edema   . Elevated uric acid in blood   . GI bleed 12/2015  . Hyperlipidemia   . Hypertension   . Long-term (current) use of anticoagulants   . Moderate mitral regurgitation   . Pulmonary HTN ([place])    Echo 1/19: EF 60-65, no RWMA, Ao sclerosis, trivial AI, mild MR, mod BAE, mod TR, PASP 49  . Stroke ([place]) 01/25/2016  . TIA (transient ischemic attack) 09/2011  . Urinary incontinence   . Urinary retention     Past Surgical History:  Procedure Laterality Date  . APPENDECTOMY    . BREAST SURGERY  2012   Rt.mastectomy--[place], [place]  2013  . CATARACT EXTRACTION, BILATERAL    . DILATION AND CURETTAGE OF UTERUS     in her early 61's for DUB  . ESOPHAGOGASTRODUODENOSCOPY Left  01/08/2016   Procedure: ESOPHAGOGASTRODUODENOSCOPY (EGD);  Surgeon: Arta Silence, MD;  Location: [facility];  Service: Endoscopy;  Laterality: Left;  . GALLBLADDER SURGERY    . MASTECTOMY Right   . RADIOLOGY WITH ANESTHESIA N/A 01/25/2016   Procedure: RADIOLOGY WITH ANESTHESIA;  Surgeon: Luanne Bras, MD;  Location: [facility];  Service: Radiology;  Laterality: N/A;  . REPLACEMENT TOTAL KNEE BILATERAL    . TONSILLECTOMY AND ADENOIDECTOMY     as a child     Medications: Current Meds  Medication Sig  . acetaminophen (TYLENOL) 500 MG tablet Take 500-1,000 mg by mouth every 12 (twelve) hours as needed (pain).   Marland Kitchen amoxicillin (AMOXIL) 500 MG  capsule Take 2,000 mg by mouth as needed. Only takes as preventative prior to dentist  . apixaban (ELIQUIS) 5 MG TABS tablet Take 1 tablet (5 mg total) by mouth 2 (two) times daily.  . Ascorbic Acid (VITAMIN C) 1000 MG tablet Take 1,000 mg by mouth daily.  Marland Kitchen atorvastatin (LIPITOR) 10 MG tablet Take 1 tablet (10 mg total) by mouth daily.  . Calcium Citrate 250 MG TABS Take 5 tablets by mouth daily.  . Cholecalciferol (VITAMIN D-3) 1000 units CAPS Take 1,000 Units by mouth daily.  Marland Kitchen diltiazem (CARDIZEM) 60 MG tablet Take 1 tablet (60 mg total) by mouth 3 (three) times daily.  . furosemide (LASIX) 20 MG tablet Take 1 tablet (20 mg total) by mouth daily.  . metoprolol tartrate (LOPRESSOR) 100 MG tablet Take 1 tablet (100 mg total) by mouth 2 (two) times daily.  . Multiple Vitamins-Minerals (MULTIVITAMIN ADULT PO) Take 1 tablet by mouth daily.   . nitrofurantoin (MACRODANTIN) 100 MG capsule Take 1 capsule by mouth daily.  Marland Kitchen omeprazole (PRILOSEC) 20 MG capsule Take 1 capsule by mouth every other day.     Allergies: Allergies  Allergen Reactions  . Valium [Diazepam] Other (See Comments)    HYPERACTIVITY   . Amiodarone Hcl [Amiodarone] Other (See Comments)    AFIB  . Compazine [Prochlorperazine] Other (See Comments)    HYPERACTIVITY   . Other Other (See Comments)    "Kidney dye"--patient states this gave her severe shakes/chills  . Thorazine [Chlorpromazine] Other (See Comments)    HYPERACTIVITY   . Zometa [Zoledronic Acid] Other (See Comments)    PROBLEMS WITH EYES  . Ciprofloxacin Swelling    "swelling" in Rt.elbow    Social History: The patient  reports that she has quit smoking. Her smoking use included cigarettes. She has never used smokeless tobacco. She reports that she does not drink alcohol or use drugs.   Family History: The patient's family history includes Asthma in her mother; Atrial fibrillation in her sister; CVA in her father; Cancer in her daughter and maternal  grandmother; Pneumonia in her brother.   Review of Systems: Please see the history of present illness.   All other systems are reviewed and negative.   Physical Exam: VS:  BP 136/68   Pulse 81   Ht 5\' 1"  (1.549 m)   Wt 153 lb (69.4 kg)   LMP 09/12/1973 (Approximate)   SpO2 95%   BMI 28.91 kg/m  .  BMI Body mass index is 28.91 kg/m.  Wt Readings from Last 3 Encounters:  06/10/19 153 lb (69.4 kg)  05/06/19 155 lb 6.4 oz (70.5 kg)  03/11/19 149 lb (67.6 kg)    General: Pleasant. Well developed, well nourished and in no acute distress.   HEENT: Normal.  Neck: Supple, no JVD, carotid bruits, or masses  noted.  Cardiac: Irregular irregular rhythm. Rate is ok. Chronic edema. Lots of varicosities noted.   Respiratory:  Lungs are clear to auscultation bilaterally with normal work of breathing.  GI: Soft and nontender.  MS: No deformity or atrophy. Gait and ROM intact.  Skin: Warm and dry. Color is normal.  Neuro:  Strength and sensation are intact and no gross focal deficits noted.  Psych: Alert, appropriate and with normal affect.   LABORATORY DATA:  EKG:  EKG is not ordered today.  Lab Results  Component Value Date   WBC 10.9 (H) 05/06/2019   HGB 12.4 05/06/2019   HCT 36.6 05/06/2019   PLT 237 05/06/2019   GLUCOSE 106 (H) 05/06/2019   CHOL 128 05/06/2019   TRIG 101 05/06/2019   HDL 50 05/06/2019   LDLCALC 58 05/06/2019   ALT 12 05/06/2019   AST 13 05/06/2019   NA 145 (H) 05/06/2019   K 5.2 05/06/2019   CL 105 05/06/2019   CREATININE 1.07 (H) 05/06/2019   BUN 19 05/06/2019   CO2 25 05/06/2019   TSH 3.280 05/06/2019   INR 1.27 12/24/2016   HGBA1C 5.6 01/26/2016     BNP (last 3 results) No results for input(s): BNP in the last 8760 hours.  ProBNP (last 3 results) Recent Labs    05/06/19 1608  PROBNP 2,211*     Other Studies Reviewed Today:   ECHO IMPRESSIONS 04/2019   1. The left ventricle has normal systolic function with an ejection fraction of  60-65%. The cavity size was normal. Left ventricular diastolic function could not be evaluated secondary to atrial fibrillation.  2. The right ventricle has normal systolic function. The cavity was normal. There is no increase in right ventricular wall thickness. Right ventricular systolic pressure is normal.  3. Left atrial size was severely dilated.  4. There is mild mitral annular calcification present.  5. The aortic valve is tricuspid. Mild sclerosis of the aortic valve. Aortic valve regurgitation is trivial by color flow Doppler.  6. The aorta is normal unless otherwise noted.  7. The inferior vena cava was dilated in size with >50% respiratory variability.   CXR IMPRESSION 04/2019: 1.  Stable cardiomegaly.  No pulmonary venous congestion.  2.  Mild bibasilar atelectasis/scarring again noted.   Electronically Signed   By: Marcello Moores  Register   On: 05/10/2019 06:57   ASSESSMENT & PLAN:   1. Persistent AF - managed with rate control and anticoagulation - failed on amiodarone in the past. She is on full dose Eliquis without issue.   2. Chronic cough/shortness of breath - probably multifactorial with diastolic HF, GERD, post nasal drip - her most recent studies are stable and are reviewed with her. She can use extra Lasix prn but is reminded about how she apparently had gout in the past.   3. Prior stroke - this was in the setting of being off anticoagulation due to GI bleed - not discussed.   4. CAD - non obstructive CAD by cath back in [place] in 2013. She has no active symptoms.   5. HLD - on statin  6. Venous insufficiency - apparently has had gout in the past when taking more Lasix.   7. History of breast cancer- prior mastectomy on the right - not discussed.  8. COVID-19 Education: The signs and symptoms of COVID-19 were discussed with the patient and how to seek care for testing (follow up with PCP or arrange E-visit).  The importance of social distancing, staying  at  home, hand hygiene and wearing a mask when out in public were discussed today.  Current medicines are reviewed with the patient today.  The patient does not have concerns regarding medicines other than what has been noted above.  The following changes have been made:  See above.  Labs/ tests ordered today include:   No orders of the defined types were placed in this encounter.    Disposition:   FU with Dr. Marlou Porch in 3 months.   Patient is agreeable to this plan and will call if any problems develop in the interim.   SignedTruitt Merle, NP  06/10/2019 3:38 PM  [facility] [address] [place], [place]  [address] Phone: [phone] Fax: [phone]

## 2019-06-10 ENCOUNTER — Ambulatory Visit (INDEPENDENT_AMBULATORY_CARE_PROVIDER_SITE_OTHER): Payer: Medicare Other | Admitting: Nurse Practitioner

## 2019-06-10 ENCOUNTER — Encounter: Payer: Self-pay | Admitting: Nurse Practitioner

## 2019-06-10 ENCOUNTER — Other Ambulatory Visit: Payer: Self-pay

## 2019-06-10 VITALS — BP 136/68 | HR 81 | Ht 61.0 in | Wt 153.0 lb

## 2019-06-10 DIAGNOSIS — I482 Chronic atrial fibrillation, unspecified: Secondary | ICD-10-CM | POA: Diagnosis not present

## 2019-06-10 DIAGNOSIS — E785 Hyperlipidemia, unspecified: Secondary | ICD-10-CM

## 2019-06-10 DIAGNOSIS — I2510 Atherosclerotic heart disease of native coronary artery without angina pectoris: Secondary | ICD-10-CM | POA: Diagnosis not present

## 2019-06-10 DIAGNOSIS — R059 Cough, unspecified: Secondary | ICD-10-CM

## 2019-06-10 DIAGNOSIS — R05 Cough: Secondary | ICD-10-CM | POA: Diagnosis not present

## 2019-06-10 DIAGNOSIS — R0602 Shortness of breath: Secondary | ICD-10-CM | POA: Diagnosis not present

## 2019-06-10 DIAGNOSIS — I2583 Coronary atherosclerosis due to lipid rich plaque: Secondary | ICD-10-CM | POA: Diagnosis not present

## 2019-06-10 NOTE — Patient Instructions (Addendum)
After Visit Summary:  We will be checking the following labs today -  NONE   Medication Instructions:    Continue with your current medicines. BUT  It is ok to take your Omeprazole every day - this might help your cough  It is ok to use over the counter Flonase/Nasocort - this might help your cough  It is ok to use an extra Furosemide on occasion if needed for swelling.    If you need a refill on your cardiac medications before your next appointment, please call your pharmacy.     Testing/Procedures To Be Arranged:  N/A   Follow-Up:   See Dr. Marlou Porch in 3 months.     At [facility], you and your health needs are our priority.  As part of our continuing mission to provide you with exceptional heart care, we have created designated Provider Care Teams.  These Care Teams include your primary Cardiologist (physician) and Advanced Practice Providers (APPs -  Physician Assistants and Nurse Practitioners) who all work together to provide you with the care you need, when you need it.  Special Instructions:  . Stay safe, stay home, wash your hands for at least 20 seconds and wear a mask when out in public.  . It was good to talk with you today.    Call the [place] office at [phone] if you have any questions, problems or concerns.

## 2019-06-12 ENCOUNTER — Telehealth: Payer: Self-pay | Admitting: *Deleted

## 2019-06-12 NOTE — Telephone Encounter (Signed)
Received call from pt stating she was supposed to be referred to Dr. Jana Hakim in June from Dr. Kennis Carina who is her gynecologist.  Pt wanting to know if we received the referral and when she can be scheduled.  RN will reach out to Seth Bake with new pt referrals to see if referral has been received and when we could schedule pt.   RN educated pt that our office will stay in touch with her and let her know if we received the referral and when she can be scheduled.

## 2019-06-19 ENCOUNTER — Other Ambulatory Visit: Payer: Self-pay | Admitting: Cardiology

## 2019-06-20 NOTE — Telephone Encounter (Signed)
Age 85, weight 69.4kg, SCr 1.07 drawn 05/06/19, last OV Sept 2020, afib indication

## 2019-07-09 ENCOUNTER — Other Ambulatory Visit: Payer: Self-pay

## 2019-07-11 ENCOUNTER — Other Ambulatory Visit: Payer: Self-pay

## 2019-07-11 ENCOUNTER — Ambulatory Visit (INDEPENDENT_AMBULATORY_CARE_PROVIDER_SITE_OTHER): Payer: Medicare Other | Admitting: Obstetrics and Gynecology

## 2019-07-11 ENCOUNTER — Encounter: Payer: Self-pay | Admitting: Obstetrics and Gynecology

## 2019-07-11 ENCOUNTER — Telehealth: Payer: Self-pay | Admitting: Obstetrics and Gynecology

## 2019-07-11 VITALS — BP 140/82 | HR 76 | Temp 97.7°F | Ht 60.5 in | Wt 155.6 lb

## 2019-07-11 DIAGNOSIS — Z4689 Encounter for fitting and adjustment of other specified devices: Secondary | ICD-10-CM

## 2019-07-11 DIAGNOSIS — Z853 Personal history of malignant neoplasm of breast: Secondary | ICD-10-CM

## 2019-07-11 DIAGNOSIS — N813 Complete uterovaginal prolapse: Secondary | ICD-10-CM

## 2019-07-11 DIAGNOSIS — I2583 Coronary atherosclerosis due to lipid rich plaque: Secondary | ICD-10-CM | POA: Diagnosis not present

## 2019-07-11 DIAGNOSIS — I2510 Atherosclerotic heart disease of native coronary artery without angina pectoris: Secondary | ICD-10-CM

## 2019-07-11 NOTE — Telephone Encounter (Signed)
Reviewed previous referral notes, waiting for previous oncology records from [place] and med records request from patient.   New order placed for ambulatory referral to Dr. Jana Hakim.   Routing to [employer] .

## 2019-07-11 NOTE — Progress Notes (Signed)
GYNECOLOGY  VISIT   HPI: 85 y.o.   Married  Caucasian  female   [phone] with Patient's last menstrual period was 09/12/1973 (approximate).   here for pessary check. Pessary currently out.  One week ago, had a small amount of vaginal bleeding. Occurring intermittently since then.  She removed the pessary today and had some pink drainage. No pain.  Voiding well.  Sees Dr. Matilde Sprang and taking daily Macrobid abx for UTI prevention.   Asking for follow up of the referral to Dr. Jana Hakim for her breast cancer dx in 2012.   GYNECOLOGIC HISTORY: Patient's last menstrual period was 09/12/1973 (approximate). Contraception: Postmenopausal Menopausal hormone therapy: none Last mammogram: 11-15-17 Neg breast MRI.  No imaging this year so far  Last pap smear:  11-25-15 Neg        OB History    Gravida  3   Para  3   Term  3   Preterm      AB      Living  3     SAB      TAB      Ectopic      Multiple      Live Births                 Patient Active Problem List   Diagnosis Date Noted  . Diplopia 01/29/2016  . GERD (gastroesophageal reflux disease) 01/29/2016  . Cerebrovascular accident (CVA) due to embolism of left middle cerebral artery ([place]) 01/29/2016  . Persistent atrial fibrillation ([place])   . Bleeding gastrointestinal   . Gait disturbance, post-stroke 01/27/2016  . Fall   . Secondary hypertension, unspecified   . Cerebral infarction due to thrombosis of left middle cerebral artery (HCC) s/p mechanical thrombectomy   . Malnutrition of moderate degree 01/09/2016  . GI bleed 01/06/2016  . Chronic atrial fibrillation ([place]) 06/10/2015  . Pulmonary hypertension, secondary ([place]) 06/10/2015  . Chronic anticoagulation 06/10/2015  . Hyperlipidemia 06/10/2015  . Essential hypertension 06/10/2015  . History of stroke 06/10/2015  . Coronary artery disease due to lipid rich plaque 06/10/2015  . Afib ([place])   . Elevated uric acid in blood   . Arthritis of ankle   .  Pulmonary HTN ([place])     Past Medical History:  Diagnosis Date  . Afib ([place])   . Arthritis of ankle   . CAD (coronary artery disease)   . Cancer [facility]) 2012   breast cancer-right  . Compression fracture of T12 vertebra (HCC)   . Diverticulosis   . Edema   . Elevated uric acid in blood   . GI bleed 12/2015  . Hyperlipidemia   . Hypertension   . Long-term (current) use of anticoagulants   . Moderate mitral regurgitation   . Pulmonary HTN ([place])    Echo 1/19: EF 60-65, no RWMA, Ao sclerosis, trivial AI, mild MR, mod BAE, mod TR, PASP 49  . Stroke ([place]) 01/25/2016  . TIA (transient ischemic attack) 09/2011  . Urinary incontinence   . Urinary retention     Past Surgical History:  Procedure Laterality Date  . APPENDECTOMY    . BREAST SURGERY  2012   Rt.mastectomy--[place], [place]  2013  . CATARACT EXTRACTION, BILATERAL    . DILATION AND CURETTAGE OF UTERUS     in her early 65's for DUB  . ESOPHAGOGASTRODUODENOSCOPY Left 01/08/2016   Procedure: ESOPHAGOGASTRODUODENOSCOPY (EGD);  Surgeon: Arta Silence, MD;  Location: [facility];  Service: Endoscopy;  Laterality:  Left;  . GALLBLADDER SURGERY    . MASTECTOMY Right   . RADIOLOGY WITH ANESTHESIA N/A 01/25/2016   Procedure: RADIOLOGY WITH ANESTHESIA;  Surgeon: Luanne Bras, MD;  Location: [facility];  Service: Radiology;  Laterality: N/A;  . REPLACEMENT TOTAL KNEE BILATERAL    . TONSILLECTOMY AND ADENOIDECTOMY     as a child    Current Outpatient Medications  Medication Sig Dispense Refill  . acetaminophen (TYLENOL) 500 MG tablet Take 500-1,000 mg by mouth every 12 (twelve) hours as needed (pain).     Marland Kitchen amoxicillin (AMOXIL) 500 MG capsule Take 2,000 mg by mouth as needed. Only takes as preventative prior to dentist    . Ascorbic Acid (VITAMIN C) 1000 MG tablet Take 1,000 mg by mouth daily.    Marland Kitchen atorvastatin (LIPITOR) 10 MG tablet TAKE 1 TABLET DAILY 90 tablet 3  . Calcium Citrate 250 MG TABS Take 5  tablets by mouth daily.    . Cholecalciferol (VITAMIN D-3) 1000 units CAPS Take 1,000 Units by mouth daily.    Marland Kitchen diltiazem (CARDIZEM) 60 MG tablet TAKE 1 TABLET THREE TIMES A DAY 270 tablet 3  . ELIQUIS 5 MG TABS tablet TAKE 1 TABLET TWICE A DAY 180 tablet 1  . furosemide (LASIX) 20 MG tablet Take 1 tablet (20 mg total) by mouth daily. 90 tablet 3  . metoprolol tartrate (LOPRESSOR) 100 MG tablet TAKE 1 TABLET TWICE A DAY 180 tablet 3  . Multiple Vitamins-Minerals (MULTIVITAMIN ADULT PO) Take 1 tablet by mouth daily.     . nitrofurantoin (MACRODANTIN) 100 MG capsule Take 1 capsule by mouth daily.    Marland Kitchen omeprazole (PRILOSEC) 20 MG capsule Take 1 capsule by mouth every other day.     No current facility-administered medications for this visit.      ALLERGIES: Valium [diazepam], Amiodarone hcl [amiodarone], Compazine [prochlorperazine], Other, Thorazine [chlorpromazine], Zometa [zoledronic acid], and Ciprofloxacin  Family History  Problem Relation Age of Onset  . Asthma Mother   . CVA Father   . Atrial fibrillation Sister        HAS PACER  . Pneumonia Brother   . Cancer Daughter        COLON CANCER  . Cancer Maternal Grandmother        breast cancer    Social History   Socioeconomic History  . Marital status: Married    Spouse name: Not on file  . Number of children: Not on file  . Years of education: Not on file  . Highest education level: Not on file  Occupational History  . Not on file  Social Needs  . Financial resource strain: Not on file  . Food insecurity    Worry: Not on file    Inability: Not on file  . Transportation needs    Medical: Not on file    Non-medical: Not on file  Tobacco Use  . Smoking status: Former Smoker    Types: Cigarettes  . Smokeless tobacco: Never Used  Substance and Sexual Activity  . Alcohol use: No    Alcohol/week: 0.0 standard drinks  . Drug use: No  . Sexual activity: Yes    Partners: Male    Birth control/protection:  Post-menopausal  Lifestyle  . Physical activity    Days per week: Not on file    Minutes per session: Not on file  . Stress: Not on file  Relationships  . Social Herbalist on phone: Not on file    Gets  together: Not on file    Attends religious service: Not on file    Active member of club or organization: Not on file    Attends meetings of clubs or organizations: Not on file    Relationship status: Not on file  . Intimate partner violence    Fear of current or ex partner: Not on file    Emotionally abused: Not on file    Physically abused: Not on file    Forced sexual activity: Not on file  Other Topics Concern  . Not on file  Social History Narrative  . Not on file    Review of Systems  All other systems reviewed and are negative.   PHYSICAL EXAMINATION:    BP 140/82 (Cuff Size: Large)   Pulse 76   Temp 97.7 F (36.5 C) (Temporal)   Ht 5' 0.5" (1.537 m)   Wt 155 lb 9.6 oz (70.6 kg)   LMP 09/12/1973 (Approximate)   BMI 29.89 kg/m     General appearance: alert, cooperative and appears stated age  Pelvic: External genitalia:  no lesions              Urethra:  normal appearing urethra with no masses, tenderness or lesions              Bartholins and Skenes: normal                 Vagina: scar tissue of bilateral vaginal walls toward apex consistent with rubbing from the pessary.               Cervix: no lesions                Bimanual Exam:  Uterus:  normal size, contour, position, consistency, mobility, non-tender              Adnexa: no mass, fullness, tenderness             Chaperone was present for exam.  ASSESSMENT  Complete uterovaginal prolapse.  Pessary maintenance. Hx UTIs.  Taking prophylaxis. Hx right breast cancer.   PLAN  Continue pessary use.  Call for any bleeding.  She may choose to take the pessary out and then have me replace it 2 - 3 days later in the office.  She is not able to place it back after removal. Pessary check in 3  months.  Referral to Dr. Jana Hakim through our triage office.    An After Visit Summary was printed and given to the patient.  _15_____ minutes face to face time of which over 50% was spent in counseling.

## 2019-07-11 NOTE — Telephone Encounter (Signed)
Please make an appointment for patient to see Dr. Jana Hakim for history of right breast cancer, dx in 2012.   I made a referral in June, 2020, and it was closed.  I am no certain of the details of this closure to the referral.   Please make an appointment for the patient.

## 2019-07-12 DIAGNOSIS — Z23 Encounter for immunization: Secondary | ICD-10-CM | POA: Diagnosis not present

## 2019-08-07 NOTE — Telephone Encounter (Signed)
Please check in status of referral.  Cc- Triage

## 2019-08-12 ENCOUNTER — Telehealth: Payer: Self-pay | Admitting: Oncology

## 2019-08-12 NOTE — Telephone Encounter (Signed)
A new patient appt has been scheduled for Adrienne [name] to see Dr. Jana Hakim on 12/2 at 2:30pm. Pt aware to arrive 15 minutes early.

## 2019-08-13 NOTE — Telephone Encounter (Signed)
Per review of Epic, patient is scheduled with Dr. Jana Hakim on 08/14/19 at 2:30pm.   Routing to Dr. Antony Blackbird.   Cc: Magdalene Patricia  Encounter closed.

## 2019-08-13 NOTE — Progress Notes (Signed)
[facility]  [phone]) [phone] [phone]) [phone]     ID: Simren Popson DOB: 1928-06-07  MR#: [id number]  WVP#:[id number]  Patient Care Team: Kathyrn Lass, MD as PCP - General (Family Medicine) Yisroel Ramming, Everardo All, MD as Consulting Physician (Obstetrics and Gynecology) Tannisha Kennington, Virgie Dad, MD as Consulting Physician (Oncology) Jerline Pain, MD as Consulting Physician (Cardiology) Chauncey Cruel, MD OTHER MD:  CHIEF COMPLAINT: Breast cancer estrogen receptor positive  CURRENT TREATMENT: Observation   HISTORY OF CURRENT ILLNESS: Deann Mclaine has a right breast mass noted by her primary care doctor in [place] in 2012.  She was referred to surgery and underwent right mastectomy and axillary lymph node dissection or sampling (we do not have the pathology report) 06/04/2011 for what appears to have been a multicentric invasive ductal carcinoma, grade 2.  There were three separate tumor foci, spanning a total of 3.8 cm.. Prognostic indicators were: ER positive, PR positive, and Her2 negative. One out of 3 biopsied lymph nodes was positive for micrometastasis.   The patient's subsequent history is as detailed below.   INTERVAL HISTORY: Kayly was evaluated in the breast cancer clinic on 08/14/2019 .   REVIEW OF SYSTEMS: Kaydence currently denies unusual headaches, visual changes, nausea, vomiting, stiff neck, dizziness, or gait imbalance. There has been no cough, phlegm production, or pleurisy, no chest pain or pressure, and no fever, rash, bleeding, unexplained fatigue or unexplained weight loss. She had moderate urinary incontinence.,She is not exercising regularly.A detailed review of systems was otherwise noncontributory.   PAST MEDICAL HISTORY: Past Medical History:  Diagnosis Date  . Afib ([place])   . Arthritis of ankle   . CAD (coronary artery disease)   . Cancer [facility]) 2012   breast cancer-right  . Compression fracture of T12 vertebra  ([facility])   . Diverticulosis   . Edema   . Elevated uric acid in blood   . GI bleed 12/2015  . Hyperlipidemia   . Hypertension   . Long-term (current) use of anticoagulants   . Moderate mitral regurgitation   . Pulmonary HTN ([place])    Echo 1/19: EF 60-65, no RWMA, Ao sclerosis, trivial AI, mild MR, mod BAE, mod TR, PASP 49  . Stroke ([place]) 01/25/2016  . TIA (transient ischemic attack) 09/2011  . Urinary incontinence   . Urinary retention     PAST SURGICAL HISTORY: Past Surgical History:  Procedure Laterality Date  . APPENDECTOMY    . BREAST SURGERY  2012   Rt.mastectomy--[place], [place]  2013  . CATARACT EXTRACTION, BILATERAL    . DILATION AND CURETTAGE OF UTERUS     in her early 8's for DUB  . ESOPHAGOGASTRODUODENOSCOPY Left 01/08/2016   Procedure: ESOPHAGOGASTRODUODENOSCOPY (EGD);  Surgeon: Arta Silence, MD;  Location: [facility];  Service: Endoscopy;  Laterality: Left;  . GALLBLADDER SURGERY    . MASTECTOMY Right   . RADIOLOGY WITH ANESTHESIA N/A 01/25/2016   Procedure: RADIOLOGY WITH ANESTHESIA;  Surgeon: Luanne Bras, MD;  Location: [facility];  Service: Radiology;  Laterality: N/A;  . REPLACEMENT TOTAL KNEE BILATERAL    . TONSILLECTOMY AND ADENOIDECTOMY     as a child    FAMILY HISTORY: Family History  Problem Relation Age of Onset  . Asthma Mother   . CVA Father   . Atrial fibrillation Sister        HAS PACER  . Pneumonia Brother   . Cancer Daughter        COLON CANCER  .  Cancer Maternal Grandmother        breast cancer   Patient's father was 77 years old when he died from stroke. Patient's mother died from coronary artery disease at age 32. The patient denies a family hx of ovarian cancer. She reports breast cancer in her maternal grandmother. She has 2 siblings, 1 brother and 1 sister. Her brother died from stroke/AIDS. She also reports pancreatic cancer in her paternal grandmother and colon cancer in her daughter.   GYNECOLOGIC  HISTORY:  Patient's last menstrual period was 09/12/1973 (approximate). Menarche: 85 years old Age at first live birth: 85 years old [place] P 3 LMP late 75s Contraceptive never HRT yes, remote , less than 6 months Hysterectomy?  No BSO?  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  Her husband [name] is a Marketing executive, (PhD, worked at [employer]).  Their children are Lockie Pares who lives in [place] and whose husband is a English as a second language teacher; Blair [employer]") who lives in [place] and works in finances; and Lushton, who lives in [place] and works for [employer].  The patient has 11 grandchildren and 8 great-grandchildren.  She attends [employer] locally    ADVANCED DIRECTIVES: In the absence of documents to the contrary the patient's husband is her healthcare power of attorney   HEALTH MAINTENANCE: Social History   Tobacco Use  . Smoking status: Former Smoker    Types: Cigarettes  . Smokeless tobacco: Never Used  Substance Use Topics  . Alcohol use: No    Alcohol/week: 0.0 standard drinks  . Drug use: No     Colonoscopy: Remote  PAP: 11/2015, negative  Bone density: Osteopenia   Allergies  Allergen Reactions  . Valium [Diazepam] Other (See Comments)    HYPERACTIVITY   . Amiodarone Hcl [Amiodarone] Other (See Comments)    AFIB  . Compazine [Prochlorperazine] Other (See Comments)    HYPERACTIVITY   . Other Other (See Comments)    "Kidney dye"--patient states this gave her severe shakes/chills  . Thorazine [Chlorpromazine] Other (See Comments)    HYPERACTIVITY   . Zometa [Zoledronic Acid] Other (See Comments)    PROBLEMS WITH EYES  . Ciprofloxacin Swelling    "swelling" in Rt.elbow    Current Outpatient Medications  Medication Sig Dispense Refill  . acetaminophen (TYLENOL) 500 MG tablet Take 500-1,000 mg by mouth every 12 (twelve) hours as needed (pain).     Marland Kitchen  amoxicillin (AMOXIL) 500 MG capsule Take 2,000 mg by mouth as needed. Only takes as preventative prior to dentist    . Ascorbic Acid (VITAMIN C) 1000 MG tablet Take 1,000 mg by mouth daily.    Marland Kitchen atorvastatin (LIPITOR) 10 MG tablet TAKE 1 TABLET DAILY 90 tablet 3  . Calcium Citrate 250 MG TABS Take 5 tablets by mouth daily.    . Cholecalciferol (VITAMIN D-3) 1000 units CAPS Take 1,000 Units by mouth daily.    Marland Kitchen diltiazem (CARDIZEM) 60 MG tablet TAKE 1 TABLET THREE TIMES A DAY 270 tablet 3  . ELIQUIS 5 MG TABS tablet TAKE 1 TABLET TWICE A DAY 180 tablet 1  . furosemide (LASIX) 20 MG tablet Take 1 tablet (20 mg total) by mouth daily. 90 tablet 3  . metoprolol tartrate (LOPRESSOR) 100 MG tablet TAKE 1 TABLET TWICE A DAY 180 tablet 3  . Multiple Vitamins-Minerals (MULTIVITAMIN ADULT PO) Take 1 tablet by mouth  daily.     . nitrofurantoin (MACRODANTIN) 100 MG capsule Take 1 capsule by mouth daily.    Marland Kitchen omeprazole (PRILOSEC) 20 MG capsule Take 1 capsule by mouth every other day.     No current facility-administered medications for this visit.     OBJECTIVE: Older white woman examined in a wheelchair  Vitals:   08/14/19 1430  BP: (!) 163/63  Pulse: 65  Resp: 18  Temp: 98.9 F (37.2 C)  SpO2: 97%     Body mass index is 29.89 kg/m.   Wt Readings from Last 3 Encounters:  07/11/19 155 lb 9.6 oz (70.6 kg)  06/10/19 153 lb (69.4 kg)  05/06/19 155 lb 6.4 oz (70.5 kg)      ECOG FS:2 - Symptomatic, <50% confined to bed  Ocular: Sclerae unicteric, pupils round and equal Ear-nose-throat: Wearing a mask Lymphatic: No cervical or supraclavicular adenopathy Lungs no rales or rhonchi Heart irregular, no murmur appreciated Abd soft, nontender, positive bowel sounds, no palpable masses MSK no focal spinal tenderness, bilateral ankle edema Neuro: non-focal, well-oriented, positive affect Breasts: The right breast is status post mastectomy.  There is no evidence of chest wall recurrence.  The left  breast is benign.  Both axillae are benign.   LAB RESULTS:  CMP     Component Value Date/Time   NA 145 (H) 05/06/2019 1608   K 5.2 05/06/2019 1608   CL 105 05/06/2019 1608   CO2 25 05/06/2019 1608   GLUCOSE 106 (H) 05/06/2019 1608   GLUCOSE 101 (H) 08/30/2017 0056   BUN 19 05/06/2019 1608   CREATININE 1.07 (H) 05/06/2019 1608   CALCIUM 9.9 05/06/2019 1608   PROT 6.6 05/06/2019 1608   ALBUMIN 3.9 05/06/2019 1608   AST 13 05/06/2019 1608   ALT 12 05/06/2019 1608   ALKPHOS 84 05/06/2019 1608   BILITOT 0.4 05/06/2019 1608   GFRNONAA 46 (L) 05/06/2019 1608   GFRAA 53 (L) 05/06/2019 1608    No results found for: TOTALPROTELP, ALBUMINELP, A1GS, A2GS, BETS, BETA2SER, GAMS, MSPIKE, SPEI  No results found for: KPAFRELGTCHN, LAMBDASER, KAPLAMBRATIO  Lab Results  Component Value Date   WBC 10.9 (H) 05/06/2019   NEUTROABS 10.1 (H) 08/30/2017   HGB 12.4 05/06/2019   HCT 36.6 05/06/2019   MCV 92 05/06/2019   PLT 237 05/06/2019    @LASTCHEMISTRY @  No results found for: LABCA2  No components found for: RXVQMG867  No results for input(s): INR in the last 168 hours.  No results found for: LABCA2  No results found for: YPP509  No results found for: TOI712  No results found for: WPY099  No results found for: CA2729  No components found for: HGQUANT  No results found for: CEA1 / No results found for: CEA1   No results found for: AFPTUMOR  No results found for: CHROMOGRNA  No results found for: PSA1  No visits with results within 3 Day(s) from this visit.  Latest known visit with results is:  Office Visit on 05/06/2019  Component Date Value Ref Range Status  . Glucose 05/06/2019 106* 65 - 99 mg/dL Final  . BUN 05/06/2019 19  8 - 27 mg/dL Final  . Creatinine, Ser 05/06/2019 1.07* 0.57 - 1.00 mg/dL Final  . GFR calc non Af Amer 05/06/2019 46* >59 mL/min/1.73 Final  . GFR calc Af Amer 05/06/2019 53* >59 mL/min/1.73 Final  . BUN/Creatinine Ratio 05/06/2019 18  12 -  28 Final  . Sodium 05/06/2019 145* 134 - 144 mmol/L Final  .  Potassium 05/06/2019 5.2  3.5 - 5.2 mmol/L Final  . Chloride 05/06/2019 105  96 - 106 mmol/L Final  . CO2 05/06/2019 25  20 - 29 mmol/L Final  . Calcium 05/06/2019 9.9  8.7 - 10.3 mg/dL Final  . WBC 05/06/2019 10.9* 3.4 - 10.8 x10E3/uL Final  . RBC 05/06/2019 3.97  3.77 - 5.28 x10E6/uL Final  . Hemoglobin 05/06/2019 12.4  11.1 - 15.9 g/dL Final  . Hematocrit 05/06/2019 36.6  34.0 - 46.6 % Final  . MCV 05/06/2019 92  79 - 97 fL Final  . MCH 05/06/2019 31.2  26.6 - 33.0 pg Final  . MCHC 05/06/2019 33.9  31.5 - 35.7 g/dL Final  . RDW 05/06/2019 12.7  11.7 - 15.4 % Final  . Platelets 05/06/2019 237  150 - 450 x10E3/uL Final  . Total Protein 05/06/2019 6.6  6.0 - 8.5 g/dL Final  . Albumin 05/06/2019 3.9  3.6 - 4.6 g/dL Final  . Bilirubin Total 05/06/2019 0.4  0.0 - 1.2 mg/dL Final  . Bilirubin, Direct 05/06/2019 0.13  0.00 - 0.40 mg/dL Final  . Alkaline Phosphatase 05/06/2019 84  39 - 117 IU/L Final  . AST 05/06/2019 13  0 - 40 IU/L Final  . ALT 05/06/2019 12  0 - 32 IU/L Final  . Cholesterol, Total 05/06/2019 128  100 - 199 mg/dL Final  . Triglycerides 05/06/2019 101  0 - 149 mg/dL Final  . HDL 05/06/2019 50  >39 mg/dL Final  . VLDL Cholesterol Cal 05/06/2019 20  5 - 40 mg/dL Final  . LDL Calculated 05/06/2019 58  0 - 99 mg/dL Final   Comment: **Effective May 13, 2019, [facility] is implementing an improved** equation to calculate Low Density Lipoprotein Cholesterol (LDL-C) concentrations, to be used in all lipid panels that report calculated LDL-C. This equation was developed through a collaboration with the [employer] (NIH).[1] The NIH calculation overcomes the limitations of the existing Friedewald LDL-C equation and performs equally well in both fasting and non-fasting individuals. 1. Pauline Good Q, et al. A new equation for calculation of low-density lipoprotein  cholesterol in patients with normolipidemia and/or hypertriglyceridemia. JAMA Cardiol. 2020 Feb 26. doi:10.1001/jamacardio.2020.0013   . Chol/HDL Ratio 05/06/2019 2.6  0.0 - 4.4 ratio Final   Comment:                                   T. Chol/HDL Ratio                                             Men  Women                               1/2 Avg.Risk  3.4    3.3                                   Avg.Risk  5.0    4.4                                2X Avg.Risk  9.6    7.1  3X Avg.Risk 23.4   11.0   . NT-Pro BNP 05/06/2019 2,211* 0 - 738 pg/mL Final   Comment: The following cut-points have been suggested for the use of proBNP for the diagnostic evaluation of heart failure (HF) in patients with acute dyspnea: Modality                     Age           Optimal Cut                            (years)            Point ------------------------------------------------------ Diagnosis (rule in HF)        <50            450 pg/mL                           50 - 75            900 pg/mL                               >75           1800 pg/mL Exclusion (rule out HF)  Age independent     300 pg/mL   . TSH 05/06/2019 3.280  0.450 - 4.500 uIU/mL Final    (this displays the last labs from the last 3 days)  No results found for: TOTALPROTELP, ALBUMINELP, A1GS, A2GS, BETS, BETA2SER, GAMS, MSPIKE, SPEI (this displays SPEP labs)  No results found for: KPAFRELGTCHN, LAMBDASER, KAPLAMBRATIO (kappa/lambda light chains)  No results found for: HGBA, HGBA2QUANT, HGBFQUANT, HGBSQUAN (Hemoglobinopathy evaluation)   No results found for: LDH  No results found for: IRON, TIBC, IRONPCTSAT (Iron and TIBC)  No results found for: FERRITIN  Urinalysis    Component Value Date/Time   COLORURINE YELLOW 08/29/2017 2343   APPEARANCEUR CLOUDY (A) 08/29/2017 2343   LABSPEC 1.005 08/29/2017 2343   PHURINE 6.0 08/29/2017 2343   GLUCOSEU NEGATIVE 08/29/2017 2343   HGBUR SMALL (A)  08/29/2017 2343   BILIRUBINUR n 02/08/2018 1651   KETONESUR NEGATIVE 08/29/2017 2343   PROTEINUR Positive (A) 02/08/2018 1651   PROTEINUR NEGATIVE 08/29/2017 2343   UROBILINOGEN 0.2 02/08/2018 1651   NITRITE positive 02/08/2018 1651   NITRITE NEGATIVE 08/29/2017 2343   LEUKOCYTESUR Large (3+) (A) 02/08/2018 1651     STUDIES: No results found.  ELIGIBLE FOR AVAILABLE RESEARCH PROTOCOL: No  ASSESSMENT: 85 y.o. [place] woman status post right mastectomy and sentinel lymph node sampling September 2012 for what appears to have been a multifocal T1-T2 N1 (mic) invasive ductal carcinoma, estrogen and progesterone receptor positive, HER-2 not amplified  (1) completed 5 years of anastrozole 2017  PLAN: I spent approximately 60 minutes face to face with Nayelly with more than 50% of that time spent in counseling and coordination of care. Specifically we reviewed the biology of the patient's diagnosis and the specifics of her situation.  We reviewed her original presentation and pathology to the best of her ability to recall at and also limited by the amount of information that is available.  It could be her multifocal disease was really connected.  The total area was 3.8 cm.  At worst then she would have had a stage II breast cancer.  She did not receive postmastectomy radiation.  She  did well with anastrozole for 5 years.  It is not clear to me why she was receiving MRIs to the contralateral breast.  She does have breast density category C, but her risk of developing another cancer in that breast does not reach 20% lifetime.  Accordingly I recommended not continuing yearly breast MRI.  She will need yearly mammography with tomography and I am setting that up for her in June so that hopefully by then she will have had her vaccine for the coronavirus.  She requests a repeat bone density at the same time and I am glad to order that for her.  Note however that she tells me she had anaphylaxis with  zoledronate and that she would not want any bisphosphonate.  She has been offered denosumab/Prolia but has been deterred by the possible side effects and complications of that agent.  We will discuss this further once we have the repeat bone density on hand  Otherwise the plan is to see her yearly x2.  When she completes a 10 years of follow-up which will be in 2022 we will release her.  I wrote her a prescription for bras and prosthesis and directed her aware she can have this filled most easily.  She has a good understanding of this plan.  She knows to call us with any other issue that may develop before the next visit  .  Chauncey Cruel, MD   08/14/2019 4:03 PM Medical Oncology and Hematology [facility] [place], [place] [address] Tel. [phone]    Fax. [phone]   This document serves as a record of services personally performed by Lurline Del, MD. It was created on his behalf by Wilburn Mylar, a trained medical scribe. The creation of this record is based on the scribe's personal observations and the provider's statements to them.   I, Lurline Del MD, have reviewed the above documentation for accuracy and completeness, and I agree with the above.

## 2019-08-14 ENCOUNTER — Other Ambulatory Visit: Payer: Self-pay

## 2019-08-14 ENCOUNTER — Inpatient Hospital Stay: Payer: Medicare Other | Attending: Oncology | Admitting: Oncology

## 2019-08-14 VITALS — BP 163/63 | HR 65 | Temp 98.9°F | Resp 18 | Ht 60.5 in

## 2019-08-14 DIAGNOSIS — I4819 Other persistent atrial fibrillation: Secondary | ICD-10-CM

## 2019-08-14 DIAGNOSIS — Z853 Personal history of malignant neoplasm of breast: Secondary | ICD-10-CM | POA: Diagnosis not present

## 2019-08-14 DIAGNOSIS — Z170 Estrogen receptor positive status [ER+]: Secondary | ICD-10-CM | POA: Insufficient documentation

## 2019-08-14 DIAGNOSIS — Z87891 Personal history of nicotine dependence: Secondary | ICD-10-CM

## 2019-08-14 DIAGNOSIS — Z800 Family history of malignant neoplasm of digestive organs: Secondary | ICD-10-CM

## 2019-08-14 DIAGNOSIS — Z803 Family history of malignant neoplasm of breast: Secondary | ICD-10-CM | POA: Diagnosis not present

## 2019-08-14 DIAGNOSIS — Z9011 Acquired absence of right breast and nipple: Secondary | ICD-10-CM | POA: Diagnosis not present

## 2019-08-14 DIAGNOSIS — M8580 Other specified disorders of bone density and structure, unspecified site: Secondary | ICD-10-CM

## 2019-08-14 DIAGNOSIS — I69398 Other sequelae of cerebral infarction: Secondary | ICD-10-CM

## 2019-08-14 DIAGNOSIS — C50811 Malignant neoplasm of overlapping sites of right female breast: Secondary | ICD-10-CM | POA: Insufficient documentation

## 2019-08-14 DIAGNOSIS — R269 Unspecified abnormalities of gait and mobility: Secondary | ICD-10-CM

## 2019-08-15 ENCOUNTER — Telehealth: Payer: Self-pay | Admitting: Oncology

## 2019-08-15 NOTE — Telephone Encounter (Signed)
I talk with patient regarding schedule  

## 2019-08-21 ENCOUNTER — Encounter: Payer: Self-pay | Admitting: Cardiology

## 2019-08-21 ENCOUNTER — Ambulatory Visit (INDEPENDENT_AMBULATORY_CARE_PROVIDER_SITE_OTHER): Payer: Medicare Other | Admitting: Cardiology

## 2019-08-21 ENCOUNTER — Other Ambulatory Visit: Payer: Self-pay

## 2019-08-21 VITALS — BP 136/60 | HR 68 | Ht 60.5 in | Wt 152.0 lb

## 2019-08-21 DIAGNOSIS — I2510 Atherosclerotic heart disease of native coronary artery without angina pectoris: Secondary | ICD-10-CM | POA: Diagnosis not present

## 2019-08-21 DIAGNOSIS — R059 Cough, unspecified: Secondary | ICD-10-CM

## 2019-08-21 DIAGNOSIS — I2583 Coronary atherosclerosis due to lipid rich plaque: Secondary | ICD-10-CM

## 2019-08-21 DIAGNOSIS — R05 Cough: Secondary | ICD-10-CM | POA: Diagnosis not present

## 2019-08-21 DIAGNOSIS — Z7901 Long term (current) use of anticoagulants: Secondary | ICD-10-CM | POA: Diagnosis not present

## 2019-08-21 NOTE — Addendum Note (Signed)
Addended by: Nuala Alpha on: 08/21/2019 03:38 PM   Modules accepted: Orders

## 2019-08-21 NOTE — Patient Instructions (Signed)
Medication Instructions:   YOU  CAN NOW TAKE YOUR PRILOSEC 10 MG BY MOUTH DAILY EVERY OTHER DAY  *If you need a refill on your cardiac medications before your next appointment, please call your pharmacy*    Follow-Up:  Your physician wants you to follow-up in:6 [place] NP  You will receive a reminder letter in the mail two months in advance. If you don't receive a letter, please call our office to schedule the follow-up appointment.   At [facility], you and your health needs are our priority.  As part of our continuing mission to provide you with exceptional heart care, we have created designated Provider Care Teams.  These Care Teams include your primary Cardiologist (physician) and Advanced Practice Providers (APPs -  Physician Assistants and Nurse Practitioners) who all work together to provide you with the care you need, when you need it.  Your next appointment:   12 month(s)  The format for your next appointment:   In Person  Provider:   Dr. Marlou Porch

## 2019-08-21 NOTE — Progress Notes (Signed)
Cardiology Office Note   Date:  08/21/2019   ID:  Adrienne [name], DOB [birth year], MRN [id number]  PCP:  Kathyrn Lass, MD  Cardiologist:   Candee Furbish, MD     History of Present Illness: Adrienne [name] is a 85 y.o. female (I see her husband, from [place]) here for follow up of atrial fibrillation, pulmonary hypertension, coronary artery disease, mitral regurgitation on chronic anticoagulation patient of Dr. Kathyrn Lass.  Admitted in April 2017 with intermittent bloody stools. Anticoagulation was placed on hold. GI work up unremarkable (Hiatal hernia and diverticulosis). On Jan 25, 2016 had right side weakness and was found in bathroom by husband. Coumadin was restarted about a week prior to this, INR 1.5.Left MCA infarct. Interventional radiology. Transitioned to Eliquis on May 18.    Atrial fibrillation: In the past she has tried amiodarone but now has this listed under allergies. Currently under rate control strategy. Taking diltiazem, metoprolol. Had minor stroke years ago on Zometa in 2013 breast cancer. Right breast.   Coronary artery disease: Detected on catheterization in February 2013, nonobstructive. Echo on 09/2014 showed moderate mitral regurgitation. Normal pulmonary pressures.  Hyperlipidemia-has had aversion to statins in the past. Used to take [employer].  She has had prior visits with Dr. Sabra Heck for dizziness. Vertigo-like symptoms. During that visit she was concerned about cardiology referral for management of A. fib and Coumadin.  Pulmonary hypertension: secondary, been steady for years. In fact last echocardiogram reassuring with no significant elevation pressures.  Having SOB. Chronic. Dyspnea  11/23/17 - spoke about carotids, reviewed studies, stroke. AFIB discussion. No CP, no SOB. Edema chronic.   05/29/2018- chief complaint-follow-up of atrial fibrillation.  Overall she has been doing quite well.  Carotid arteries were mild bilaterally.  She has been  taking 10 mg of atorvastatin.  She used to be adverse to this.  LDL 52 in 2018.  Excellent.  Denies any fevers chills nausea vomiting syncope bleeding orthopnea PND. She soes have cough more often.    08/21/2019-here for the follow-up of atrial fibrillation.  Very well rate control.  No bleeding episodes no recent strokes.  No fevers chills nausea vomiting.  She does have this chronic cough which has been a nuisance for her.  Did not change when we increased her Prilosec.  She is going to check this out with Dr. Sabra Heck.    Past Medical History:  Diagnosis Date  . Afib ([place])   . Arthritis of ankle   . CAD (coronary artery disease)   . Cancer [facility]) 2012   breast cancer-right  . Compression fracture of T12 vertebra (HCC)   . Diverticulosis   . Edema   . Elevated uric acid in blood   . GI bleed 12/2015  . Hyperlipidemia   . Hypertension   . Long-term (current) use of anticoagulants   . Moderate mitral regurgitation   . Pulmonary HTN ([place])    Echo 1/19: EF 60-65, no RWMA, Ao sclerosis, trivial AI, mild MR, mod BAE, mod TR, PASP 49  . Stroke ([place]) 01/25/2016  . TIA (transient ischemic attack) 09/2011  . Urinary incontinence   . Urinary retention     Past Surgical History:  Procedure Laterality Date  . APPENDECTOMY    . BREAST SURGERY  2012   Rt.mastectomy--[place], [place]  2013  . CATARACT EXTRACTION, BILATERAL    . DILATION AND CURETTAGE OF UTERUS     in her early 51's for DUB  . ESOPHAGOGASTRODUODENOSCOPY  Left 01/08/2016   Procedure: ESOPHAGOGASTRODUODENOSCOPY (EGD);  Surgeon: Arta Silence, MD;  Location: [facility];  Service: Endoscopy;  Laterality: Left;  . GALLBLADDER SURGERY    . MASTECTOMY Right   . RADIOLOGY WITH ANESTHESIA N/A 01/25/2016   Procedure: RADIOLOGY WITH ANESTHESIA;  Surgeon: Luanne Bras, MD;  Location: [place];  Service: Radiology;  Laterality: N/A;  . REPLACEMENT TOTAL KNEE BILATERAL    . TONSILLECTOMY AND ADENOIDECTOMY      as a child     Current Outpatient Medications  Medication Sig Dispense Refill  . acetaminophen (TYLENOL) 500 MG tablet Take 500-1,000 mg by mouth every 12 (twelve) hours as needed (pain).     Marland Kitchen amoxicillin (AMOXIL) 500 MG capsule Take 2,000 mg by mouth as needed. Only takes as preventative prior to dentist    . Ascorbic Acid (VITAMIN C) 1000 MG tablet Take 1,000 mg by mouth daily.    Marland Kitchen atorvastatin (LIPITOR) 10 MG tablet TAKE 1 TABLET DAILY 90 tablet 3  . Calcium Citrate 250 MG TABS Take 5 tablets by mouth daily.    . Cholecalciferol (VITAMIN D-3) 1000 units CAPS Take 1,000 Units by mouth daily.    Marland Kitchen diltiazem (CARDIZEM) 60 MG tablet TAKE 1 TABLET THREE TIMES A DAY 270 tablet 3  . ELIQUIS 5 MG TABS tablet TAKE 1 TABLET TWICE A DAY 180 tablet 1  . furosemide (LASIX) 20 MG tablet Take 1 tablet (20 mg total) by mouth daily. 90 tablet 3  . metoprolol tartrate (LOPRESSOR) 100 MG tablet TAKE 1 TABLET TWICE A DAY 180 tablet 3  . Multiple Vitamins-Minerals (MULTIVITAMIN ADULT PO) Take 1 tablet by mouth daily.     . nitrofurantoin (MACRODANTIN) 100 MG capsule Take 1 capsule by mouth daily.    Marland Kitchen omeprazole (PRILOSEC) 10 MG capsule Take 10 mg by mouth daily.    Marland Kitchen omeprazole (PRILOSEC) 20 MG capsule Take 1 capsule by mouth every other day.     No current facility-administered medications for this visit.     Allergies:   Valium [diazepam], Amiodarone hcl [amiodarone], Compazine [prochlorperazine], Other, Thorazine [chlorpromazine], Zometa [zoledronic acid], and Ciprofloxacin    Social History:  The patient  reports that she has quit smoking. Her smoking use included cigarettes. She has never used smokeless tobacco. She reports that she does not drink alcohol or use drugs.   Family History:  The patient's family history includes Asthma in her mother; Atrial fibrillation in her sister; CVA in her father; Cancer in her daughter and maternal grandmother; Pneumonia in her brother.    ROS:  Please see  the history of present illness.     PHYSICAL EXAM: VS:  BP 136/60   Pulse 68   Ht 5' 0.5" (1.537 m)   Wt 152 lb (68.9 kg)   LMP 09/12/1973 (Approximate)   SpO2 97%   BMI 29.20 kg/m  , BMI Body mass index is 29.2 kg/m. GEN: Well nourished, well developed, in no acute distress  HEENT: normal  Neck: no JVD, carotid bruits, or masses Cardiac: irreg; no murmurs, rubs, or gallops,no edema  Respiratory:  clear to auscultation bilaterally, normal work of breathing GI: soft, nontender, nondistended, + BS MS: no deformity or atrophy  Skin: warm and dry, no rash Neuro:  Alert and Oriented x 3, Strength and sensation are intact Psych: euthymic mood, full affect     EKG:  11/23/17 - AFIB 63, NSSTW changes.  06/10/15-atrial fibrillation heart rate 64, no other abnormalities. Personally viewed-prior Prior EKG from 10/29/11  shows atrial fibrillation with heart rate 110 bpm, no other abnormalities.  ECHO 01/26/16 - Left ventricle: The cavity size was normal. Systolic function was   normal. The estimated ejection fraction was in the range of 60%   to 65%. Wall motion was normal; there were no regional wall   motion abnormalities. The study is not technically sufficient to   allow evaluation of LV diastolic function. - Aortic valve: Valve mobility was mildly restricted. Transvalvular   velocity was within the normal range. There was no stenosis.   There was trivial regurgitation. - Mitral valve: Calcified annulus. Transvalvular velocity was   within the normal range. There was no evidence for stenosis.   There was trivial regurgitation. - Left atrium: The atrium was moderately dilated. - Right ventricle: The cavity size was normal. Wall thickness was   normal. Systolic function was normal. - Atrial septum: No defect or patent foramen ovale was identified   by color flow Doppler. - Tricuspid valve: There was mild regurgitation. - Pulmonary arteries: Systolic pressure was within the normal    range. PA peak pressure: 29 mm Hg (S).  Recent Labs: 05/06/2019: ALT 12; BUN 19; Creatinine, Ser 1.07; Hemoglobin 12.4; NT-Pro BNP 2,211; Platelets 237; Potassium 5.2; Sodium 145; TSH 3.280    Lipid Panel    Component Value Date/Time   CHOL 128 05/06/2019 1608   TRIG 101 05/06/2019 1608   HDL 50 05/06/2019 1608   CHOLHDL 2.6 05/06/2019 1608   CHOLHDL 3.3 01/26/2016 0300   VLDL 20 01/26/2016 0300   LDLCALC 58 05/06/2019 1608      Wt Readings from Last 3 Encounters:  08/21/19 152 lb (68.9 kg)  07/11/19 155 lb 9.6 oz (70.6 kg)  06/10/19 153 lb (69.4 kg)      Other studies Reviewed: Additional studies/ records that were reviewed today include: office notes, labs, EKG, echoew of the above records demonstrates: as above   ASSESSMENT AND PLAN:  1. Permanent atrial fibrillation -Since 2013. Failed amiodarone. -This patients CHA2DS2-VASc Score and unadjusted Ischemic Stroke Rate (% per year) is equal to 9.7 % stroke rate/year from a score of 6  Above score calculated as 1 point each if present [CHF, HTN, DM, Vascular=MI/PAD/Aortic Plaque, Age if 65-74, or Female] Above score calculated as 2 points each if present [Age > 75, or Stroke/TIA/TE]  -Continuing with chronic anti-coagulation. Eliquis. Off coumadin. Stroke (INR 1.5).  -Under good rate control with heart rates between 60 and 90 mostly. Continue with metoprolol and diltiazem. She takes split dosing of this medication. -She had a stroke, left MCA when she had to be off of anticoagulation secondary to gastrointestinal bleeding.  It is very important for [place] to try to bridge her if we ever need to perform other procedure.  - Currently stable. Had prior bleeding of varicose vein, ER, pressure, stopped.  Overall doing very well.   2. Coronary artery disease -Nonobstructive CAD, she had normal cardiac catheterization in 2013 in [place].  Doing very well without any anginal symptoms  3. Pulmonary hypertension previously diagnosed  however most recent echocardiograms have not shown any evidence of this -She had been diagnosis with this previously in [place] and was getting echocardiograms every 6 months. Echocardiogram here showed normal pulmonary pressures. Reassurance.   4. Hyperlipidemia -LDL 52. Prior stroke. Deserves statin. On atorvastatin 40 mg once a day. She has been anti-statin in the past. She showed me a article from Dr. Talmage Coin regarding co Q 10 previously. She is now taking  the atorvastatin alone with no red yeast rice. LDL in the 50s. Excellent.  No changes.  5.Chronic anticoagulation   - on Eliquis, used to be on Coumadin.  She is not having any bleeding.  6. History of breast cancer -Prior mastectomy right-sided.  7. History of stroke -During breast cancer treatment after receiving Zometa as well as during a period of cessation of coumadin in the setting of GI bleeding. She is on chronic anticoagulation, now Eliquis.  No recurrence  8. Chronic lower extremity edema/venous insufficiency -Lasix,  Now off with 3 gout attacks.  Encouraged thigh-high stockings. She does not like to wear these in the past. Stable chronic condition.  9. Carotid artery disease - Mild bilateral carotid stenosis.  Ultrasound checked 12/04/2017  10.  Cough-she was worried that this may be related to congestive heart failure.  Reassurance was given.  She does have Lasix 20 mg as needed.   I doubt that this is CHF related.  I have asked her to check in with Dr. Sabra Heck.  She is tried for Prilosec daily and this did not help.  She wants to go back to every other day.  This is fine.  Current medicines are reviewed at length with the patient today.  The patient does not have concerns regarding medicines.  The following changes have been made:  no change  Labs/ tests ordered today include:   No orders of the defined types were placed in this encounter.    Disposition:     Signed, Candee Furbish, MD  08/21/2019 3:31 PM    [facility] [address], [place], [place]  [address] Phone: [phone]; Fax: [phone]

## 2019-08-28 ENCOUNTER — Other Ambulatory Visit: Payer: Self-pay | Admitting: Family Medicine

## 2019-08-28 DIAGNOSIS — R911 Solitary pulmonary nodule: Secondary | ICD-10-CM

## 2019-09-09 ENCOUNTER — Ambulatory Visit
Admission: RE | Admit: 2019-09-09 | Discharge: 2019-09-09 | Disposition: A | Discharge status: Home / Self Care | Payer: Medicare Other | Source: Ambulatory Visit | Attending: Family Medicine | Admitting: Family Medicine

## 2019-09-09 DIAGNOSIS — R911 Solitary pulmonary nodule: Secondary | ICD-10-CM

## 2019-09-11 ENCOUNTER — Other Ambulatory Visit (HOSPITAL_COMMUNITY): Payer: Self-pay | Admitting: Family Medicine

## 2019-09-11 ENCOUNTER — Other Ambulatory Visit: Payer: Self-pay | Admitting: Family Medicine

## 2019-09-11 DIAGNOSIS — R918 Other nonspecific abnormal finding of lung field: Secondary | ICD-10-CM

## 2019-09-20 ENCOUNTER — Other Ambulatory Visit: Payer: Self-pay

## 2019-09-20 ENCOUNTER — Encounter (HOSPITAL_COMMUNITY)
Admission: RE | Admit: 2019-09-20 | Discharge: 2019-09-20 | Disposition: A | Discharge status: Home / Self Care | Payer: Medicare Other | Source: Ambulatory Visit | Attending: Family Medicine | Admitting: Family Medicine

## 2019-09-20 DIAGNOSIS — I2510 Atherosclerotic heart disease of native coronary artery without angina pectoris: Secondary | ICD-10-CM | POA: Insufficient documentation

## 2019-09-20 DIAGNOSIS — R918 Other nonspecific abnormal finding of lung field: Secondary | ICD-10-CM | POA: Insufficient documentation

## 2019-09-20 LAB — GLUCOSE, CAPILLARY: Glucose-Capillary: 105 mg/dL — ABNORMAL HIGH (ref 70–99)

## 2019-09-20 MED ORDER — FLUDEOXYGLUCOSE F - 18 (FDG) INJECTION
7.7600 | Freq: Once | INTRAVENOUS | Status: AC | PRN
  Administered 2019-09-20: 7.76 via INTRAVENOUS

## 2019-10-01 DIAGNOSIS — R8290 Unspecified abnormal findings in urine: Secondary | ICD-10-CM | POA: Diagnosis not present

## 2019-10-04 ENCOUNTER — Encounter: Payer: Self-pay | Admitting: Thoracic Surgery (Cardiothoracic Vascular Surgery)

## 2019-10-04 ENCOUNTER — Encounter: Payer: Medicare Other | Admitting: Thoracic Surgery (Cardiothoracic Vascular Surgery)

## 2019-10-04 ENCOUNTER — Other Ambulatory Visit: Payer: Self-pay

## 2019-10-04 VITALS — BP 184/81 | HR 66 | Temp 97.7°F | Resp 20 | Ht 62.0 in | Wt 152.3 lb

## 2019-10-08 ENCOUNTER — Ambulatory Visit: Payer: Medicare Other

## 2019-10-09 DIAGNOSIS — N3020 Other chronic cystitis without hematuria: Secondary | ICD-10-CM | POA: Diagnosis not present

## 2019-10-09 DIAGNOSIS — R3914 Feeling of incomplete bladder emptying: Secondary | ICD-10-CM | POA: Diagnosis not present

## 2019-10-11 ENCOUNTER — Other Ambulatory Visit: Payer: Self-pay

## 2019-10-14 ENCOUNTER — Other Ambulatory Visit: Payer: Self-pay

## 2019-10-14 ENCOUNTER — Ambulatory Visit (INDEPENDENT_AMBULATORY_CARE_PROVIDER_SITE_OTHER): Payer: Medicare Other | Admitting: Obstetrics and Gynecology

## 2019-10-14 ENCOUNTER — Encounter: Payer: Self-pay | Admitting: Obstetrics and Gynecology

## 2019-10-14 VITALS — BP 140/70 | HR 60 | Temp 97.2°F | Ht 60.5 in | Wt 151.0 lb

## 2019-10-14 DIAGNOSIS — N898 Other specified noninflammatory disorders of vagina: Secondary | ICD-10-CM

## 2019-10-14 DIAGNOSIS — N813 Complete uterovaginal prolapse: Secondary | ICD-10-CM | POA: Diagnosis not present

## 2019-10-14 NOTE — Progress Notes (Signed)
GYNECOLOGY  VISIT   HPI: 86 y.o.   Married  Caucasian  female   [phone] with Patient's last menstrual period was 09/12/1973 (approximate).   here for pessary check.     She has noted a little blood on her pessary when she took it out this am.  No vaginal burning, irritation, or itching.  She noticed some drainage on the pessary.  She is emptying her bladder well.  She is having good bowel function.  She can have diarrhea depending on her dietary choice.  She saw Dr. Jana Hakim for her breast care.  She will do a mammogram and BMD.   She has a new dx of nodules in her lungs. She had a PET scan to look for metastatic disease. She will see pulmonology this week.  She has had a cough since January, 2021.   She is interested to have her Covid vaccine. Family has been touched by Covid. Everyone seems to be doing ok.    GYNECOLOGIC HISTORY: Patient's last menstrual period was 09/12/1973 (approximate). Contraception:  PMP Menopausal hormone therapy:  none Last mammogram: 11-15-17 Neg breast MRI Last pap smear:03-11-19 Neg, 11-25-15 Neg        OB History    Gravida  3   Para  3   Term  3   Preterm      AB      Living  3     SAB      TAB      Ectopic      Multiple      Live Births                 Patient Active Problem List   Diagnosis Date Noted  . Malignant neoplasm of overlapping sites of right breast in female, estrogen receptor positive ([place]) 08/14/2019  . Osteopenia 08/14/2019  . Diplopia 01/29/2016  . GERD (gastroesophageal reflux disease) 01/29/2016  . Cerebrovascular accident (CVA) due to embolism of left middle cerebral artery ([place]) 01/29/2016  . Persistent atrial fibrillation ([place])   . Bleeding gastrointestinal   . Gait disturbance, post-stroke 01/27/2016  . Fall   . Secondary hypertension, unspecified   . Cerebral infarction due to thrombosis of left middle cerebral artery (HCC) s/p mechanical thrombectomy   . Malnutrition of moderate degree  01/09/2016  . GI bleed 01/06/2016  . Chronic atrial fibrillation ([place]) 06/10/2015  . Pulmonary hypertension, secondary ([place]) 06/10/2015  . Chronic anticoagulation 06/10/2015  . Hyperlipidemia 06/10/2015  . Essential hypertension 06/10/2015  . History of stroke 06/10/2015  . Coronary artery disease due to lipid rich plaque 06/10/2015  . Afib ([place])   . Elevated uric acid in blood   . Arthritis of ankle   . Pulmonary HTN ([place])     Past Medical History:  Diagnosis Date  . Afib ([place])   . Arthritis of ankle   . CAD (coronary artery disease)   . Cancer [facility]) 2012   breast cancer-right  . Compression fracture of T12 vertebra (HCC)   . Diverticulosis   . Edema   . Elevated uric acid in blood   . GI bleed 12/2015  . Hyperlipidemia   . Hypertension   . Long-term (current) use of anticoagulants   . Moderate mitral regurgitation   . Pulmonary HTN ([place])    Echo 1/19: EF 60-65, no RWMA, Ao sclerosis, trivial AI, mild MR, mod BAE, mod TR, PASP 49  . Stroke ([place]) 01/25/2016  . TIA (transient ischemic attack) 09/2011  . Urinary  incontinence   . Urinary retention     Past Surgical History:  Procedure Laterality Date  . APPENDECTOMY    . BREAST SURGERY  2012   Rt.mastectomy--[place], [place]  2013  . CATARACT EXTRACTION, BILATERAL    . DILATION AND CURETTAGE OF UTERUS     in her early 63's for DUB  . ESOPHAGOGASTRODUODENOSCOPY Left 01/08/2016   Procedure: ESOPHAGOGASTRODUODENOSCOPY (EGD);  Surgeon: Arta Silence, MD;  Location: [facility];  Service: Endoscopy;  Laterality: Left;  . GALLBLADDER SURGERY    . MASTECTOMY Right   . RADIOLOGY WITH ANESTHESIA N/A 01/25/2016   Procedure: RADIOLOGY WITH ANESTHESIA;  Surgeon: Luanne Bras, MD;  Location: [place];  Service: Radiology;  Laterality: N/A;  . REPLACEMENT TOTAL KNEE BILATERAL    . TONSILLECTOMY AND ADENOIDECTOMY     as a child    Current Outpatient Medications  Medication Sig Dispense Refill  .  acetaminophen (TYLENOL) 500 MG tablet Take 500-1,000 mg by mouth every 12 (twelve) hours as needed (pain).     Marland Kitchen amoxicillin (AMOXIL) 500 MG capsule Take 2,000 mg by mouth as needed. Only takes as preventative prior to dentist    . Ascorbic Acid (VITAMIN C) 1000 MG tablet Take 1,000 mg by mouth daily.    Marland Kitchen atorvastatin (LIPITOR) 10 MG tablet TAKE 1 TABLET DAILY 90 tablet 3  . Calcium Citrate 250 MG TABS Take 5 tablets by mouth daily.    . cefdinir (OMNICEF) 300 MG capsule Take 1 capsule by mouth 2 (two) times daily.    . Cholecalciferol (VITAMIN D-3) 1000 units CAPS Take 1,000 Units by mouth daily.    Marland Kitchen diltiazem (CARDIZEM) 60 MG tablet TAKE 1 TABLET THREE TIMES A DAY 270 tablet 3  . ELIQUIS 5 MG TABS tablet TAKE 1 TABLET TWICE A DAY 180 tablet 1  . furosemide (LASIX) 20 MG tablet Take 1 tablet (20 mg total) by mouth daily. 90 tablet 3  . metoprolol tartrate (LOPRESSOR) 100 MG tablet TAKE 1 TABLET TWICE A DAY 180 tablet 3  . Multiple Vitamins-Minerals (MULTIVITAMIN ADULT PO) Take 1 tablet by mouth daily.     . nitrofurantoin (MACRODANTIN) 100 MG capsule Take 1 capsule by mouth daily.    Marland Kitchen omeprazole (PRILOSEC) 10 MG capsule Take 10 mg by mouth every other day.     No current facility-administered medications for this visit.     ALLERGIES: Valium [diazepam], Amiodarone hcl [amiodarone], Compazine [prochlorperazine], Other, Thorazine [chlorpromazine], Zometa [zoledronic acid], and Ciprofloxacin  Family History  Problem Relation Age of Onset  . Asthma Mother   . CVA Father   . Atrial fibrillation Sister        HAS PACER  . Pneumonia Brother   . Cancer Daughter        COLON CANCER  . Cancer Maternal Grandmother        breast cancer    Social History   Socioeconomic History  . Marital status: Married    Spouse name: Not on file  . Number of children: Not on file  . Years of education: Not on file  . Highest education level: Not on file  Occupational History  . Not on file   Tobacco Use  . Smoking status: Former Smoker    Types: Cigarettes  . Smokeless tobacco: Never Used  Substance and Sexual Activity  . Alcohol use: No    Alcohol/week: 0.0 standard drinks  . Drug use: No  . Sexual activity: Yes    Partners:  Male    Birth control/protection: Post-menopausal  Other Topics Concern  . Not on file  Social History Narrative  . Not on file   Social Determinants of Health   Financial Resource Strain:   . Difficulty of Paying Living Expenses: Not on file  Food Insecurity:   . Worried About Charity fundraiser in the Last Year: Not on file  . Ran Out of Food in the Last Year: Not on file  Transportation Needs:   . Lack of Transportation (Medical): Not on file  . Lack of Transportation (Non-Medical): Not on file  Physical Activity:   . Days of Exercise per Week: Not on file  . Minutes of Exercise per Session: Not on file  Stress:   . Feeling of Stress : Not on file  Social Connections:   . Frequency of Communication with Friends and Family: Not on file  . Frequency of Social Gatherings with Friends and Family: Not on file  . Attends Religious Services: Not on file  . Active Member of Clubs or Organizations: Not on file  . Attends Archivist Meetings: Not on file  . Marital Status: Not on file  Intimate Partner Violence:   . Fear of Current or Ex-Partner: Not on file  . Emotionally Abused: Not on file  . Physically Abused: Not on file  . Sexually Abused: Not on file    Review of Systems  All other systems reviewed and are negative.   PHYSICAL EXAMINATION:    BP 140/70 (Cuff Size: Large)   Pulse 60   Temp (!) 97.2 F (36.2 C) (Temporal)   Ht 5' 0.5" (1.537 m)   Wt 151 lb (68.5 kg)   LMP 09/12/1973 (Approximate)   BMI 29.00 kg/m     General appearance: alert, cooperative and appears stated age  Pelvic: External genitalia:  no lesions              Urethra:  normal appearing urethra with no masses, tenderness or lesions               Bartholins and Skenes: normal                 Vagina: bilateral vaginal apices with papillations and ulceration.  Left side with protrusion of polypoid lesion.               Cervix: no lesions                Bimanual Exam:  Uterus:  normal size, contour, position, consistency, mobility, non-tender              Adnexa: no mass, fullness, tenderness             Pessary cleansed and replaced.   Chaperone was present for exam:  Evern Core, RN.   ASSESSMENT  On Eliquis.  Complete uterovaginal prolapse.  Vaginal lesions.  I suspect this is all granulation tissue due to her large pessary as she did not have this prior to using the larger pessaries.  Pulmonary nodules.   PLAN  I discussed her vaginal changes from the pessary use.  I am recommending a office biopsy.  Will need to coordinate with her cardiologist regarding cessation of her Eliquis prior to doing the biopsy.  Ok to continue pessary use.    An After Visit Summary was printed and given to the patient.  __20____ minutes face to face time of which over 50% was spent in counseling.

## 2019-10-16 ENCOUNTER — Telehealth: Payer: Self-pay | Admitting: Obstetrics and Gynecology

## 2019-10-16 ENCOUNTER — Telehealth: Payer: Self-pay | Admitting: Cardiology

## 2019-10-16 NOTE — Telephone Encounter (Signed)
Pt takes Eliquis for afib with CHADS2VASc score of 7 (age x2, sex, HTN, CAD, stroke in 2017 in setting of subtherapeutic INR of 1.5). SCr 1.07, CrCl 38.9mL/min.   Recommend only holding Eliquis for 1 day prior to procedure due to elevated cardiac risk including history of stroke. If > 24 hour hold is required, will need MD clearance.

## 2019-10-16 NOTE — Telephone Encounter (Signed)
Pre-op covering staff, can you please check with requesting office to see if they are OK if we only hold Eliquis for 1 day given elevated cardiac risk including history of stroke? If it needs to be held longer, we will need to get the OK from Dr. Marlou Porch.  Thank you! Gerline Ratto

## 2019-10-16 NOTE — Telephone Encounter (Signed)
Call placed to [facility]. Spoke with Caryl Pina. Information provided as seen below per Dr. Quincy Simmonds. Was advised their office will f/u with recommendations.

## 2019-10-16 NOTE — Telephone Encounter (Signed)
Left message in regards to Eliquis. See message from PharmD and Pre op provider.

## 2019-10-16 NOTE — Telephone Encounter (Signed)
   [facility] Pre-operative Risk Assessment    Request for surgical clearance:  1. What type of surgery is being performed? Vaginal Biopsy   2. When is this surgery scheduled? TBD   3. What type of clearance is required (medical clearance vs. Pharmacy clearance to hold med vs. Both)? Both   4. Are there any medications that need to be held prior to surgery and how long?  ELIQUIS 5 MG TABS tablet  5. Practice name and name of physician performing surgery? [facility], Dr. Quincy Simmonds   6. What is your office phone number [phone]   7.   What is your office fax number [phone]  8.   Anesthesia type (None, local, MAC, general) ? None    Trilby Drummer 10/16/2019, 10:05 AM  _________________________________________________________________   (provider comments below)

## 2019-10-16 NOTE — Telephone Encounter (Signed)
Please reach out to Dr. Mariah Milling office, cardiology, regarding need to perform an office vaginal biopsy on this mutual patient who takes Eliquis. She has a polypoid area of her vaginal sidewall that is likely granulation tissue from her pessary rubbing on the lateral sidewalls.   She has a history of a stroke and we want to have her off the Eliquis for the minimal time necessary.  What are his recommendations regarding time off of Eliquis to perform this biopsy safely?  I expect the biopsy to produce some but not extensive bleeding.

## 2019-10-16 NOTE — Telephone Encounter (Signed)
Tried to reach the surgeon's office though phone lines are off during lunch break. I will call back later today to ask if ok to hold Eliquis x 1 day. See notes from PharmD as well as PA.

## 2019-10-17 ENCOUNTER — Encounter: Payer: Self-pay | Admitting: Pulmonary Disease

## 2019-10-17 ENCOUNTER — Other Ambulatory Visit: Payer: Self-pay

## 2019-10-17 ENCOUNTER — Ambulatory Visit: Payer: Medicare Other | Attending: Internal Medicine

## 2019-10-17 ENCOUNTER — Ambulatory Visit (INDEPENDENT_AMBULATORY_CARE_PROVIDER_SITE_OTHER): Payer: Medicare Other | Admitting: Pulmonary Disease

## 2019-10-17 VITALS — BP 120/70 | HR 64 | Temp 97.1°F | Ht 62.0 in | Wt 153.0 lb

## 2019-10-17 DIAGNOSIS — R918 Other nonspecific abnormal finding of lung field: Secondary | ICD-10-CM | POA: Diagnosis not present

## 2019-10-17 DIAGNOSIS — I4819 Other persistent atrial fibrillation: Secondary | ICD-10-CM | POA: Diagnosis not present

## 2019-10-17 DIAGNOSIS — Z170 Estrogen receptor positive status [ER+]: Secondary | ICD-10-CM

## 2019-10-17 DIAGNOSIS — C50811 Malignant neoplasm of overlapping sites of right female breast: Secondary | ICD-10-CM | POA: Diagnosis not present

## 2019-10-17 DIAGNOSIS — Z8673 Personal history of transient ischemic attack (TIA), and cerebral infarction without residual deficits: Secondary | ICD-10-CM

## 2019-10-17 DIAGNOSIS — Z23 Encounter for immunization: Secondary | ICD-10-CM | POA: Insufficient documentation

## 2019-10-17 NOTE — Patient Instructions (Signed)
Thank you for visiting Dr. Valeta Harms at [facility]. Today we recommend the following:  Orders Placed This Encounter  Procedures  . CT Chest Wo Contrast   Return in about 3 months (around 01/14/2020). after CT Chest with Dr. Valeta Harms.     Please do your part to reduce the spread of COVID-19.

## 2019-10-17 NOTE — Telephone Encounter (Signed)
I am ok with her holding for 24 hours prior to procedure.  When should she start it again post procedure?

## 2019-10-17 NOTE — Telephone Encounter (Signed)
Received a call from [place] with [facility]. They are recommend only holding Eliquis for 1 day prior to procedure due to elevated cardiac risk including history of stroke. Arbie Cookey would like to know if > 24 hour hold is required/recommended by our office. If so this will need to go to Dr.Skain's for further review.

## 2019-10-17 NOTE — Progress Notes (Signed)
   Covid-19 Vaccination Clinic  Name:  Alexiz Frerking    MRN: [id number] DOB: 02-Apr-1928  10/17/2019  Ms. Yeung was observed post Covid-19 immunization for 15 minutes without incidence. She was provided with Vaccine Information Sheet and instruction to access the V-Safe system.   Ms. Chadha was instructed to call 911 with any severe reactions post vaccine: Marland Kitchen Difficulty breathing  . Swelling of your face and throat  . A fast heartbeat  . A bad rash all over your body  . Dizziness and weakness    Immunizations Administered    Name Date Dose VIS Date Route   Pfizer COVID-19 Vaccine 10/17/2019  6:02 PM 0.3 mL 08/23/2019 Intramuscular   Manufacturer: [place]   Lot: [id number]   [place]: [id number]

## 2019-10-17 NOTE — Telephone Encounter (Signed)
Spoke with Arbie Cookey at [facility]. Advised okay to hold Eliquis for only 24 hours prior. Per Arbie Cookey patient will need to restart Eliquis right after procedure due to her risk for stroke without it. Arbie Cookey will send this information to the cardiologist and will have the clearance form with information faxed to our office.

## 2019-10-17 NOTE — Progress Notes (Signed)
Synopsis: Referred in February 2021 for the lung nodules by Dr. Kipp Brood, PCP: By Kathyrn Lass, MD  Subjective:   PATIENT ID: Adrienne [name] GENDER: female DOB: 08-17-1928, MRN: [id number]  Chief Complaint  Patient presents with  . Pulmonary Consult    Referred by Dr. Kipp Brood for pulmonary nodules. Patient reports sob with exertion and a dry cough.    This is an 86 year old female past medical history of atrial fibrillation on oral anticoagulation, hyperlipidemia, hypertension, history of breast cancer status post mastectomy.  Patient was initially seen in consultation by Dr. Kipp Brood on 10/04/2019, office visit reviewed.  Dr. Kipp Brood felt as if it be better for [place] to follow-up in pulmonary clinic instead of surgery.  Additionally patient was seen in December 2020 by cardiology Dr. Marlou Porch office note reviewed.  Currently being managed for atrial fibrillation permanent A. fib on Eliquis.  Patient had a prior diagnosis of pulmonary hypertension however follow-up echocardiogram reveals normal RV function.  Patient states that she has had lung nodules for several years.  Dating back since 2015.  She also had images in 2017 from an abdominal CT which showed a 4 mm lower lobe lesion.  She ultimately had follow-up for this.  She had a CT scan in December 2020 which revealed numerous bilateral pulmonary nodules both solid subsolid and groundglass.  The right middle lobe nodule stable since the 2017 and compared to 7.  This is considered benign.  A new right lower lobe nodule in the posterior aspect which is slightly enlarged.  Otherwise stable.  Patient had pet imaging for follow-up in January 2021 this revealed multiple bilateral pulmonary nodules again all of which were nonspecific.  Patient had no evidence of axillary supraclavicular hilar adenopathy there is a small 1 cm low right paratracheal lymph node with an SUV of 3.05.  The remainder of the lesions there is a small subpleural right lower lobe  lesion at 1.1 cm with an SUV max of 2.19 the remainder nodules subpleural within the apex with an SUV max 2.12.  Multiple scattered other nodules.  All subcentimeter in size.     Past Medical History:  Diagnosis Date  . Afib ([place])   . Arthritis of ankle   . CAD (coronary artery disease)   . Cancer [facility]) 2012   breast cancer-right  . Compression fracture of T12 vertebra (HCC)   . Diverticulosis   . Edema   . Elevated uric acid in blood   . GI bleed 12/2015  . Hyperlipidemia   . Hypertension   . Long-term (current) use of anticoagulants   . Moderate mitral regurgitation   . Pulmonary HTN ([place])    Echo 1/19: EF 60-65, no RWMA, Ao sclerosis, trivial AI, mild MR, mod BAE, mod TR, PASP 49  . Stroke ([place]) 01/25/2016  . TIA (transient ischemic attack) 09/2011  . Urinary incontinence   . Urinary retention      Family History  Problem Relation Age of Onset  . Asthma Mother   . CVA Father   . Atrial fibrillation Sister        HAS PACER  . Pneumonia Brother   . Cancer Daughter        COLON CANCER  . Cancer Maternal Grandmother        breast cancer     Past Surgical History:  Procedure Laterality Date  . APPENDECTOMY    . BREAST SURGERY  2012   Rt.mastectomy--[place], [place]  2013  . CATARACT  EXTRACTION, BILATERAL    . DILATION AND CURETTAGE OF UTERUS     in her early 36's for DUB  . ESOPHAGOGASTRODUODENOSCOPY Left 01/08/2016   Procedure: ESOPHAGOGASTRODUODENOSCOPY (EGD);  Surgeon: Arta Silence, MD;  Location: [facility];  Service: Endoscopy;  Laterality: Left;  . GALLBLADDER SURGERY    . MASTECTOMY Right   . RADIOLOGY WITH ANESTHESIA N/A 01/25/2016   Procedure: RADIOLOGY WITH ANESTHESIA;  Surgeon: Luanne Bras, MD;  Location: [place];  Service: Radiology;  Laterality: N/A;  . REPLACEMENT TOTAL KNEE BILATERAL    . TONSILLECTOMY AND ADENOIDECTOMY     as a child    Social History   Socioeconomic History  . Marital status: Married     Spouse name: Not on file  . Number of children: Not on file  . Years of education: Not on file  . Highest education level: Not on file  Occupational History  . Not on file  Tobacco Use  . Smoking status: Former Smoker    Packs/day: 0.25    Years: 2.00    Pack years: 0.50    Types: Cigarettes    Quit date: 1950    Years since quitting: 71.1  . Smokeless tobacco: Never Used  Substance and Sexual Activity  . Alcohol use: No    Alcohol/week: 0.0 standard drinks  . Drug use: No  . Sexual activity: Yes    Partners: Male    Birth control/protection: Post-menopausal  Other Topics Concern  . Not on file  Social History Narrative  . Not on file   Social Determinants of Health   Financial Resource Strain:   . Difficulty of Paying Living Expenses: Not on file  Food Insecurity:   . Worried About Charity fundraiser in the Last Year: Not on file  . Ran Out of Food in the Last Year: Not on file  Transportation Needs:   . Lack of Transportation (Medical): Not on file  . Lack of Transportation (Non-Medical): Not on file  Physical Activity:   . Days of Exercise per Week: Not on file  . Minutes of Exercise per Session: Not on file  Stress:   . Feeling of Stress : Not on file  Social Connections:   . Frequency of Communication with Friends and Family: Not on file  . Frequency of Social Gatherings with Friends and Family: Not on file  . Attends Religious Services: Not on file  . Active Member of Clubs or Organizations: Not on file  . Attends Archivist Meetings: Not on file  . Marital Status: Not on file  Intimate Partner Violence:   . Fear of Current or Ex-Partner: Not on file  . Emotionally Abused: Not on file  . Physically Abused: Not on file  . Sexually Abused: Not on file     Allergies  Allergen Reactions  . Valium [Diazepam] Other (See Comments)    HYPERACTIVITY   . Amiodarone Hcl [Amiodarone] Other (See Comments)    AFIB  . Compazine [Prochlorperazine] Other  (See Comments)    HYPERACTIVITY   . Other Other (See Comments)    "Kidney dye"--patient states this gave her severe shakes/chills  . Thorazine [Chlorpromazine] Other (See Comments)    HYPERACTIVITY   . Zometa [Zoledronic Acid] Other (See Comments)    PROBLEMS WITH EYES  . Ciprofloxacin Swelling    "swelling" in Rt.elbow     Outpatient Medications Prior to Visit  Medication Sig Dispense Refill  . acetaminophen (TYLENOL) 500 MG tablet Take 500-1,000 mg  by mouth every 12 (twelve) hours as needed (pain).     Marland Kitchen amoxicillin (AMOXIL) 500 MG capsule Take 2,000 mg by mouth as needed. Only takes as preventative prior to dentist    . Ascorbic Acid (VITAMIN C) 1000 MG tablet Take 1,000 mg by mouth daily.    Marland Kitchen atorvastatin (LIPITOR) 10 MG tablet TAKE 1 TABLET DAILY 90 tablet 3  . Calcium Citrate 250 MG TABS Take 5 tablets by mouth daily.    . Cholecalciferol (VITAMIN D-3) 1000 units CAPS Take 1,000 Units by mouth daily.    Marland Kitchen diltiazem (CARDIZEM) 60 MG tablet TAKE 1 TABLET THREE TIMES A DAY 270 tablet 3  . ELIQUIS 5 MG TABS tablet TAKE 1 TABLET TWICE A DAY 180 tablet 1  . furosemide (LASIX) 20 MG tablet Take 1 tablet (20 mg total) by mouth daily. 90 tablet 3  . metoprolol tartrate (LOPRESSOR) 100 MG tablet TAKE 1 TABLET TWICE A DAY 180 tablet 3  . Multiple Vitamins-Minerals (MULTIVITAMIN ADULT PO) Take 1 tablet by mouth daily.     . nitrofurantoin (MACRODANTIN) 100 MG capsule Take 1 capsule by mouth daily.    Marland Kitchen omeprazole (PRILOSEC) 10 MG capsule Take 10 mg by mouth every other day.    . cefdinir (OMNICEF) 300 MG capsule Take 1 capsule by mouth 2 (two) times daily.     No facility-administered medications prior to visit.    Review of Systems  Constitutional: Negative for chills, fever, malaise/fatigue and weight loss.  HENT: Negative for hearing loss, sore throat and tinnitus.   Eyes: Negative for blurred vision and double vision.  Respiratory: Negative for cough, hemoptysis, sputum production,  shortness of breath, wheezing and stridor.   Cardiovascular: Negative for chest pain, palpitations, orthopnea, leg swelling and PND.  Gastrointestinal: Negative for abdominal pain, constipation, diarrhea, heartburn, nausea and vomiting.  Genitourinary: Negative for dysuria, hematuria and urgency.  Musculoskeletal: Negative for joint pain and myalgias.  Skin: Negative for itching and rash.  Neurological: Negative for dizziness, tingling, weakness and headaches.  Endo/Heme/Allergies: Negative for environmental allergies. Does not bruise/bleed easily.  Psychiatric/Behavioral: Negative for depression. The patient is not nervous/anxious and does not have insomnia.   All other systems reviewed and are negative.    Objective:  Physical Exam Vitals reviewed.  Constitutional:      General: She is not in acute distress.    Appearance: She is well-developed.  HENT:     Head: Normocephalic and atraumatic.  Eyes:     General: No scleral icterus.    Conjunctiva/sclera: Conjunctivae normal.     Pupils: Pupils are equal, round, and reactive to light.  Neck:     Vascular: No JVD.     Trachea: No tracheal deviation.  Cardiovascular:     Rate and Rhythm: Normal rate. Rhythm irregular.     Heart sounds: Normal heart sounds. No murmur.  Pulmonary:     Effort: Pulmonary effort is normal. No tachypnea, accessory muscle usage or respiratory distress.     Breath sounds: Normal breath sounds. No stridor. No wheezing, rhonchi or rales.  Musculoskeletal:        General: No tenderness.     Cervical back: Neck supple.  Lymphadenopathy:     Cervical: No cervical adenopathy.  Skin:    General: Skin is warm and dry.     Capillary Refill: Capillary refill takes less than 2 seconds.     Findings: No rash.  Neurological:     Mental Status: She is alert and  oriented to person, place, and time.  Psychiatric:        Behavior: Behavior normal.      Vitals:   10/17/19 1150  BP: 120/70  Pulse: 64  Temp:  (!) 97.1 F (36.2 C)  TempSrc: Temporal  SpO2: 97%  Weight: 153 lb (69.4 kg)  Height: 5\' 2"  (1.575 m)   97% on RA BMI Readings from Last 3 Encounters:  10/17/19 27.98 kg/m  10/14/19 29.00 kg/m  10/04/19 27.86 kg/m   Wt Readings from Last 3 Encounters:  10/17/19 153 lb (69.4 kg)  10/14/19 151 lb (68.5 kg)  10/04/19 152 lb 4.8 oz (69.1 kg)     CBC    Component Value Date/Time   WBC 10.9 (H) 05/06/2019 1608   WBC 12.9 (H) 08/30/2017 0056   RBC 3.97 05/06/2019 1608   RBC 4.00 08/30/2017 0056   HGB 12.4 05/06/2019 1608   HCT 36.6 05/06/2019 1608   PLT 237 05/06/2019 1608   MCV 92 05/06/2019 1608   MCH 31.2 05/06/2019 1608   MCH 30.8 08/30/2017 0056   MCHC 33.9 05/06/2019 1608   MCHC 33.2 08/30/2017 0056   RDW 12.7 05/06/2019 1608   LYMPHSABS 1.4 08/30/2017 0056   MONOABS 1.3 (H) 08/30/2017 0056   EOSABS 0.2 08/30/2017 0056   BASOSABS 0.0 08/30/2017 0056    Chest Imaging: CT of the chest 09/09/2019: Numerous bilateral pulmonary hospice solid and subsolid states various locations.  Compared to the 2017 imaging overall stable there is a new one in the right lower lobe. The patient's images have been independently reviewed by me.    09/20/2019: Nuclear medicine pet imaging: pet imaging completed no evidence of axillary supraclavicular hilar adenopathy there is a small's 1 cm right lower paratracheal lymph node with SUV max 3.05.  Multiple bilateral lower lobe and upper lobe lung nodules still identified all within 1 cm in size.  Largest being in the apex at 1.3 cm SUV max of 2.012 right lower lobe 1.1 cm SUV max 2.19 remainder cystic groundglass attenuating nodule within the right lower lobe also have very low SUV of 0.9.  All of these are considered nonspecific with recommended follow-up at this time. The patient's images have been independently reviewed by me.    Pulmonary Functions Testing Results: No flowsheet data found.  Echocardiogram:  1. The left ventricle has  normal systolic function with an ejection  fraction of 60-65%. The cavity size was normal. Left ventricular diastolic  function could not be evaluated secondary to atrial fibrillation.  2. The right ventricle has normal systolic function. The cavity was  normal. There is no increase in right ventricular wall thickness. Right  ventricular systolic pressure is normal.  3. Left atrial size was severely dilated.  4. There is mild mitral annular calcification present.  5. The aortic valve is tricuspid. Mild sclerosis of the aortic valve.  Aortic valve regurgitation is trivial by color flow Doppler.  6. The aorta is normal unless otherwise noted.  7. The inferior vena cava was dilated in size with >50% respiratory  variability.    Assessment & Plan:     ICD-10-CM   1. Multiple lung nodules  R91.8 CT Chest Wo Contrast  2. Malignant neoplasm of overlapping sites of right breast in female, estrogen receptor positive ([place])  C50.811    Z17.0   3. History of stroke  Z86.73   4. Persistent atrial fibrillation (HCC)  I48.19     Discussion: This is an 86 year old [place] female  with a history of lung nodules have been followed in the past.  Also history of breast cancer status post mastectomy followed by Dr. Jana Hakim.  The multiple bilateral lung nodules are concerning with the patient's previous history of breast cancer.  This could represent metastatic disease however unlikely.  Current significance of the bilateral lung nodules is unknown.  Plan Following Extensive Data Review & Interpretation:  . I reviewed prior external note(s) from 10/04/2019 cardiothoracic surgery, Dr. Kipp Brood, 08/21/2019 Dr. Marlou Porch, cardiology office visit, atrial fibrillation on Eliquis, 08/14/2019 oncology office visit Dr. Jana Hakim . I reviewed the result(s) of nuclear medicine pet imaging, CT scan chest imaging January 2020, echocardiogram 05/09/2019 normal ejection fraction 60 to 65%, normal RV function . I have  ordered repeat noncontrasted CT of the chest in 3 months  Independent interpretation of tests . Review of patient's nuclear medicine pet imaging, noncontrasted CT of the chest, December 2020, January 2021 respectively images revealed for bilateral lung nodules as described above. The patient's images have been independently reviewed by me.    Repeat noncontrasted CT of the chest in 3 months will give [place] an opportunity to follow-up to see if these are going to change.  At this point no matter the case the lesions are too small to consider tissue sampling.  Either way we are left with radiographic surveillance.  Return to clinic in 3 months following imaging   Current Outpatient Medications:  .  acetaminophen (TYLENOL) 500 MG tablet, Take 500-1,000 mg by mouth every 12 (twelve) hours as needed (pain). , Disp: , Rfl:  .  amoxicillin (AMOXIL) 500 MG capsule, Take 2,000 mg by mouth as needed. Only takes as preventative prior to dentist, Disp: , Rfl:  .  Ascorbic Acid (VITAMIN C) 1000 MG tablet, Take 1,000 mg by mouth daily., Disp: , Rfl:  .  atorvastatin (LIPITOR) 10 MG tablet, TAKE 1 TABLET DAILY, Disp: 90 tablet, Rfl: 3 .  Calcium Citrate 250 MG TABS, Take 5 tablets by mouth daily., Disp: , Rfl:  .  Cholecalciferol (VITAMIN D-3) 1000 units CAPS, Take 1,000 Units by mouth daily., Disp: , Rfl:  .  diltiazem (CARDIZEM) 60 MG tablet, TAKE 1 TABLET THREE TIMES A DAY, Disp: 270 tablet, Rfl: 3 .  ELIQUIS 5 MG TABS tablet, TAKE 1 TABLET TWICE A DAY, Disp: 180 tablet, Rfl: 1 .  furosemide (LASIX) 20 MG tablet, Take 1 tablet (20 mg total) by mouth daily., Disp: 90 tablet, Rfl: 3 .  metoprolol tartrate (LOPRESSOR) 100 MG tablet, TAKE 1 TABLET TWICE A DAY, Disp: 180 tablet, Rfl: 3 .  Multiple Vitamins-Minerals (MULTIVITAMIN ADULT PO), Take 1 tablet by mouth daily. , Disp: , Rfl:  .  nitrofurantoin (MACRODANTIN) 100 MG capsule, Take 1 capsule by mouth daily., Disp: , Rfl:  .  omeprazole (PRILOSEC) 10 MG  capsule, Take 10 mg by mouth every other day., Disp: , Rfl:    Garner Nash, DO [facility] 10/17/2019 12:09 PM

## 2019-10-17 NOTE — Telephone Encounter (Signed)
I s/w Katelyn with Dr. Elza Rafter office in regards to how long to hold Eliquis. Katelyn did confirm Dr. Quincy Simmonds ok to hold Eliquis 24 hours prior to procedure and would like to be able to resume Eliquis right after the procedure. I assured her that I will let the pre op team know of our conversation and we can go ahead and get clearance sent over to Dr. Quincy Simmonds. Katelyn thanked me for the call.

## 2019-10-18 ENCOUNTER — Telehealth: Payer: Self-pay | Admitting: Obstetrics and Gynecology

## 2019-10-18 NOTE — Telephone Encounter (Signed)
Spoke with patient. Patient states she just received her 1st Covid19 vaccine on 2/4, would like to schedule biopsy at least 1 wk out. Vulvar biopsy scheduled for 10/29/19 at 11:30am with Dr. Quincy Simmonds.   Reviewed Eliquis instructions as seen below per  [facility]. Patient states she spoke with a nurse from their office this morning, she has concerns about being off of the Eliquis longer than 24hrs. Patient takes Eliquis at 8am and 8pm, is concerned she will be off of the medication longer than 24 hrs, would like cardiologist to clarify instructions now that she is scheduled. Advised patient I will f/u with [facility] and return call. Patient agreeable.

## 2019-10-18 NOTE — Telephone Encounter (Signed)
Call placed to convey benefits for recommended procedure. Spoke with the patient and conveyed the benefits. Patient understands/agreeable with the benefits. Patient states she has talked with her cardiologists office and is ok with the plan to stop her medication for 24 hours. Patient wants to talk with Dr. Elza Rafter nurse she has some questions.

## 2019-10-18 NOTE — Telephone Encounter (Signed)
   Primary Cardiologist: Candee Furbish, MD  Dr Marlou Porch reviewed and agrees with pharmacy recommendations that pt hold the Eliquis for 2 doses the day before the procedure and the morning of the procedure. He is ok that she will miss a total of 3 doses.   Chart reviewed as part of pre-operative protocol coverage. She was also contacted by phone.   Given past medical history and time since last visit, based on ACC/AHA guidelines, Adrienne [name] would be at acceptable risk for the planned procedure without further cardiovascular testing.   I will route this recommendation to the requesting party via Epic fax function and remove from pre-op pool.  Preop callback pool to let the patient know that Dr Marlou Porch reviewed the pharmacy recs and agrees.   Please call with questions.  Rosaria Ferries, PA-C 10/18/2019, 5:40 PM

## 2019-10-18 NOTE — Telephone Encounter (Signed)
  Patient is calling back because she takes her Eliquis at 8 am and 8 pm and was told she could go off of it 24 hours before her procedure and then start back after the procedure. She wants to know if she stops with the 8 am dose the day before and her procedure is not until 11:30 am the next day, is it okay for her to wait till 8 pm to go back to taking it? Please let patient know.

## 2019-10-18 NOTE — Telephone Encounter (Signed)
It is fine with her to hold her Eliquis 8 AM and 8 PM the day prior to surgery.  Of course she would hold it as well the morning of surgery.  She may resume it at 8 PM post surgery if okay with surgeon. Candee Furbish, MD

## 2019-10-18 NOTE — Telephone Encounter (Signed)
   Primary Cardiologist: Candee Furbish, MD  Chart reviewed as part of pre-operative protocol coverage. Patient was contacted 10/18/2019 in reference to pre-operative risk assessment for pending surgery as outlined below.  Adrienne [name] was last seen on 08/21/2019 by Dr Marlou Porch.    Since that day, Adrienne [name] has done well. She is feeling a little SOB after the vaccine dose. She also has been taking Lasix daily. However, she is generally doing well.  Therefore, based on ACC/AHA guidelines, the patient would be at acceptable risk for the planned procedure without further cardiovascular testing.   I will route this recommendation to the requesting party via Epic fax function and remove from pre-op pool.  Please call with questions.  Rosaria Ferries, PA-C 10/18/2019, 9:24 AM

## 2019-10-18 NOTE — Telephone Encounter (Signed)
See open encounter dated 10/16/19.   Encounter closed.

## 2019-10-21 NOTE — Telephone Encounter (Signed)
Per review of Adrienne [name] telephone encounter dated 10/16/19, patient was notified of Eliquis recommendations.   Call to patient, patient states she is still waiting for f/u from Dr. Marlou Porch office. Patient states she does not feel comfortable missing 3 doses of Eliquis and states she expressed this during her call. Patient is awaiting return call from [facility]. Patient states she will f/u once she has received a return call.

## 2019-10-21 NOTE — Telephone Encounter (Signed)
   I returned call and spoke with patient. She is very nervous about holding her Eliquis for upcoming biopsy because she has had a stroke before when she came off anticoagulation. I reviewed Dr. Marlou Porch recommendations that she should be fine holding 3 doses of Eliquis for the procedure (both doses the day before procedure and morning dose day of procedure). However, patient would like to only hold the PM dose the day before and morning dose the day of biopsy. I explained that that is a decision for Dr. Quincy Simmonds due to bleeding risk during procedure. I did call Dr. Elza Rafter office and speak with triage RN who stated she felt like it would be fine to only hold 2 doses. She will confirm with Dr. Quincy Simmonds and then call and update patient. I called patient again with this update and let her know that she should be receiving a call from Dr. Elza Rafter office. She was very grateful for all the help.  Darreld Mclean, PA-C 10/21/2019 12:08 PM

## 2019-10-21 NOTE — Telephone Encounter (Signed)
Callie, PA calling from [facility] and instructed ok to only hold for 2 doses of Eliquis the night before and the morning of procedure dose since pt takes at 8pm and 8am. Will return call to pt to confrm.    Spoke to pt. Confimed with pt when to stop taking Eliquis before vulvar biopsy on 10/29/2019. Pt verbalized understanding and is feeling so much better about the procedure. Pt thankful for coordination with care.   Routing to Dr Quincy Simmonds for final review and will close encounter.

## 2019-10-21 NOTE — Telephone Encounter (Signed)
Spoke with patient, calling to provide update. She has spoken with Dr. Marlou Porch office. Patient will hold Eliquis for 2 doses, she will skip the 8pm dose the night before procedure and the morning of procedure. She will resume with her 8pm dose the day of procedure.   Routing to provider for final review. Patient is agreeable to disposition. Will close encounter.

## 2019-10-21 NOTE — Telephone Encounter (Signed)
Patient's spouse calling to check on status of pre op clearance. He would like a call back.

## 2019-10-24 ENCOUNTER — Other Ambulatory Visit: Payer: Medicare Other

## 2019-10-28 ENCOUNTER — Other Ambulatory Visit: Payer: Self-pay

## 2019-10-28 NOTE — Progress Notes (Signed)
GYNECOLOGY  VISIT   HPI: 86 y.o.   Married  Caucasian  female   [phone] with Patient's last menstrual period was 09/12/1973 (approximate).   here for vaginal biopsy.     She has some significant polypoid tissue of the left vaginal apex, likely from her pessary rubbing.  She stopped her Eliquis yesterday.  She held the 8 pm dose last hs and the 8 am dose this am.  She will resume the Eliquis tonight at 8 pm.   She took the pessary out this am.  She is not able to void well without the pessary in place.   States she met with a pulmonologist, and that there is no concern about lung cancer.   GYNECOLOGIC HISTORY: Patient's last menstrual period was 09/12/1973 (approximate). Contraception:  PMP Menopausal hormone therapy: none Last mammogram:  11-15-17 Neg breast MRI--Appt.11-19-19 for Lt.Br.unilateral Last pap smear: 03-11-19 Neg, 11-25-15 Neg        OB History    Gravida  3   Para  3   Term  3   Preterm      AB      Living  3     SAB      TAB      Ectopic      Multiple      Live Births                 Patient Active Problem List   Diagnosis Date Noted  . Malignant neoplasm of overlapping sites of right breast in female, estrogen receptor positive ([place]) 08/14/2019  . Osteopenia 08/14/2019  . Diplopia 01/29/2016  . GERD (gastroesophageal reflux disease) 01/29/2016  . Cerebrovascular accident (CVA) due to embolism of left middle cerebral artery ([place]) 01/29/2016  . Persistent atrial fibrillation ([place])   . Bleeding gastrointestinal   . Gait disturbance, post-stroke 01/27/2016  . Fall   . Secondary hypertension, unspecified   . Cerebral infarction due to thrombosis of left middle cerebral artery (HCC) s/p mechanical thrombectomy   . Malnutrition of moderate degree 01/09/2016  . GI bleed 01/06/2016  . Chronic atrial fibrillation ([place]) 06/10/2015  . Pulmonary hypertension, secondary ([place]) 06/10/2015  . Chronic anticoagulation 06/10/2015  . Hyperlipidemia  06/10/2015  . Essential hypertension 06/10/2015  . History of stroke 06/10/2015  . Coronary artery disease due to lipid rich plaque 06/10/2015  . Afib ([place])   . Elevated uric acid in blood   . Arthritis of ankle   . Pulmonary HTN ([name])     Past Medical History:  Diagnosis Date  . Afib ([place])   . Arthritis of ankle   . CAD (coronary artery disease)   . Cancer [facility]) 2012   breast cancer-right  . Compression fracture of T12 vertebra (HCC)   . Diverticulosis   . Edema   . Elevated uric acid in blood   . GI bleed 12/2015  . Hyperlipidemia   . Hypertension   . Long-term (current) use of anticoagulants   . Moderate mitral regurgitation   . Pulmonary HTN ([place])    Echo 1/19: EF 60-65, no RWMA, Ao sclerosis, trivial AI, mild MR, mod BAE, mod TR, PASP 49  . Stroke ([place]) 01/25/2016  . TIA (transient ischemic attack) 09/2011  . Urinary incontinence   . Urinary retention     Past Surgical History:  Procedure Laterality Date  . APPENDECTOMY    . BREAST SURGERY  2012   Rt.mastectomy--[place], [place]  2013  . CATARACT EXTRACTION,  BILATERAL    . DILATION AND CURETTAGE OF UTERUS     in her early 3's for DUB  . ESOPHAGOGASTRODUODENOSCOPY Left 01/08/2016   Procedure: ESOPHAGOGASTRODUODENOSCOPY (EGD);  Surgeon: Arta Silence, MD;  Location: [facility];  Service: Endoscopy;  Laterality: Left;  . GALLBLADDER SURGERY    . MASTECTOMY Right   . RADIOLOGY WITH ANESTHESIA N/A 01/25/2016   Procedure: RADIOLOGY WITH ANESTHESIA;  Surgeon: Luanne Bras, MD;  Location: [facility];  Service: Radiology;  Laterality: N/A;  . REPLACEMENT TOTAL KNEE BILATERAL    . TONSILLECTOMY AND ADENOIDECTOMY     as a child    Current Outpatient Medications  Medication Sig Dispense Refill  . acetaminophen (TYLENOL) 500 MG tablet Take 500-1,000 mg by mouth every 12 (twelve) hours as needed (pain).     Marland Kitchen amoxicillin (AMOXIL) 500 MG capsule Take 2,000 mg by mouth as needed. Only takes as  preventative prior to dentist    . Ascorbic Acid (VITAMIN C) 1000 MG tablet Take 1,000 mg by mouth daily.    Marland Kitchen atorvastatin (LIPITOR) 10 MG tablet TAKE 1 TABLET DAILY 90 tablet 3  . Calcium Citrate 250 MG TABS Take 5 tablets by mouth daily.    . Cholecalciferol (VITAMIN D-3) 1000 units CAPS Take 1,000 Units by mouth daily.    Marland Kitchen diltiazem (CARDIZEM) 60 MG tablet TAKE 1 TABLET THREE TIMES A DAY 270 tablet 3  . furosemide (LASIX) 20 MG tablet Take 1 tablet (20 mg total) by mouth daily. 90 tablet 3  . metoprolol tartrate (LOPRESSOR) 100 MG tablet TAKE 1 TABLET TWICE A DAY 180 tablet 3  . Multiple Vitamins-Minerals (MULTIVITAMIN ADULT PO) Take 1 tablet by mouth daily.     . nitrofurantoin (MACRODANTIN) 100 MG capsule Take 1 capsule by mouth daily.    Marland Kitchen omeprazole (PRILOSEC) 10 MG capsule Take 10 mg by mouth every other day.    Marland Kitchen ELIQUIS 5 MG TABS tablet TAKE 1 TABLET TWICE A DAY (Patient not taking: Reported on 10/29/2019) 180 tablet 1   No current facility-administered medications for this visit.     ALLERGIES: Valium [diazepam], Amiodarone hcl [amiodarone], Compazine [prochlorperazine], Other, Thorazine [chlorpromazine], Zometa [zoledronic acid], and Ciprofloxacin  Family History  Problem Relation Age of Onset  . Asthma Mother   . CVA Father   . Atrial fibrillation Sister        HAS PACER  . Pneumonia Brother   . Cancer Daughter        COLON CANCER  . Cancer Maternal Grandmother        breast cancer    Social History   Socioeconomic History  . Marital status: Married    Spouse name: Not on file  . Number of children: Not on file  . Years of education: Not on file  . Highest education level: Not on file  Occupational History  . Not on file  Tobacco Use  . Smoking status: Former Smoker    Packs/day: 0.25    Years: 2.00    Pack years: 0.50    Types: Cigarettes    Quit date: 1950    Years since quitting: 71.1  . Smokeless tobacco: Never Used  Substance and Sexual Activity   . Alcohol use: No    Alcohol/week: 0.0 standard drinks  . Drug use: No  . Sexual activity: Yes    Partners: Male    Birth control/protection: Post-menopausal  Other Topics Concern  . Not on file  Social History Narrative  . Not on file  Social Determinants of Health   Financial Resource Strain:   . Difficulty of Paying Living Expenses: Not on file  Food Insecurity:   . Worried About Charity fundraiser in the Last Year: Not on file  . Ran Out of Food in the Last Year: Not on file  Transportation Needs:   . Lack of Transportation (Medical): Not on file  . Lack of Transportation (Non-Medical): Not on file  Physical Activity:   . Days of Exercise per Week: Not on file  . Minutes of Exercise per Session: Not on file  Stress:   . Feeling of Stress : Not on file  Social Connections:   . Frequency of Communication with Friends and Family: Not on file  . Frequency of Social Gatherings with Friends and Family: Not on file  . Attends Religious Services: Not on file  . Active Member of Clubs or Organizations: Not on file  . Attends Archivist Meetings: Not on file  . Marital Status: Not on file  Intimate Partner Violence:   . Fear of Current or Ex-Partner: Not on file  . Emotionally Abused: Not on file  . Physically Abused: Not on file  . Sexually Abused: Not on file    Review of Systems  All other systems reviewed and are negative.   PHYSICAL EXAMINATION:    BP (!) 148/80 (Cuff Size: Large)   Pulse 76   Temp (!) 96.9 F (36.1 C) (Temporal)   Ht 5' 0.5" (1.537 m)   Wt 150 lb 12.8 oz (68.4 kg)   LMP 09/12/1973 (Approximate)   BMI 28.97 kg/m     General appearance: alert, cooperative and appears stated age   Pelvic: External genitalia:  no lesions              Urethra:  normal appearing urethra with no masses, tenderness or lesions              Bartholins and Skenes: normal                 Vagina:fleshy granulation tissue of left vaginal sidewall toward  apex.              Cervix: no lesions                Bimanual Exam:  Uterus:  normal size, contour, position, consistency, mobility, non-tender              Adnexa: no mass, fullness, tenderness          Vaginal biopsy.  Consent for procedure.  Sterile prep with Hibiclens.  [place] used to do vaginal biopsy, sent to pathology.  AgNO3 placed.  Good hemostasis.  No complications.  Pessary replaced with surgilube on it.   Chaperone was present for exam.  Marisa Sprinkles.   ASSESSMENT  Vaginal lesion, likely granulation tissue due to rubbing from the pessary.   PLAN  FU biopsy results.  Ok to resume Eliquis tonight. Call for heavy vaginal bleeding, pain, or fever.  Return in 3 months for pessary check.

## 2019-10-29 ENCOUNTER — Encounter: Payer: Self-pay | Admitting: Obstetrics and Gynecology

## 2019-10-29 ENCOUNTER — Ambulatory Visit (INDEPENDENT_AMBULATORY_CARE_PROVIDER_SITE_OTHER): Payer: Medicare Other | Admitting: Obstetrics and Gynecology

## 2019-10-29 ENCOUNTER — Other Ambulatory Visit (HOSPITAL_COMMUNITY)
Admission: RE | Admit: 2019-10-29 | Discharge: 2019-10-29 | Disposition: A | Discharge status: Home / Self Care | Payer: Medicare Other | Source: Ambulatory Visit | Attending: Obstetrics and Gynecology | Admitting: Obstetrics and Gynecology

## 2019-10-29 DIAGNOSIS — N898 Other specified noninflammatory disorders of vagina: Secondary | ICD-10-CM

## 2019-10-29 DIAGNOSIS — N7689 Other specified inflammation of vagina and vulva: Secondary | ICD-10-CM | POA: Diagnosis not present

## 2019-10-29 NOTE — Patient Instructions (Signed)
Please call for heavy vaginal bleeding, pain, or fever.

## 2019-10-31 LAB — SURGICAL PATHOLOGY

## 2019-11-06 ENCOUNTER — Encounter: Payer: Self-pay | Admitting: Pulmonary Disease

## 2019-11-06 ENCOUNTER — Ambulatory Visit (INDEPENDENT_AMBULATORY_CARE_PROVIDER_SITE_OTHER): Payer: Medicare Other | Admitting: Pulmonary Disease

## 2019-11-06 ENCOUNTER — Other Ambulatory Visit: Payer: Self-pay

## 2019-11-06 DIAGNOSIS — R918 Other nonspecific abnormal finding of lung field: Secondary | ICD-10-CM | POA: Diagnosis not present

## 2019-11-06 DIAGNOSIS — K219 Gastro-esophageal reflux disease without esophagitis: Secondary | ICD-10-CM | POA: Diagnosis not present

## 2019-11-06 DIAGNOSIS — R059 Cough, unspecified: Secondary | ICD-10-CM

## 2019-11-06 DIAGNOSIS — R05 Cough: Secondary | ICD-10-CM

## 2019-11-06 NOTE — Patient Instructions (Signed)
Thank you for visiting Dr. Valeta Harms at [facility]. Today we recommend the following:  Restart PPI Head of bed elevated Avoid foods that worsen reflux symptoms.  Return in about 3 months (around 02/03/2020).    Please do your part to reduce the spread of COVID-19.

## 2019-11-06 NOTE — Progress Notes (Signed)
Virtual Visit via Telephone Note  I connected with Adrienne [name] on 11/06/19 at 10:15 AM EST by telephone and verified that I am speaking with the correct person using two identifiers.  Location: Patient: Adrienne [name] Provider: Garner Nash, DO    I discussed the limitations, risks, security and privacy concerns of performing an evaluation and management service by telephone and the availability of in person appointments. I also discussed with the patient that there may be a patient responsible charge related to this service. The patient expressed understanding and agreed to proceed.   History of Present Illness:  This is an 86 year old female past medical history of atrial fibrillation on oral anticoagulation, hyperlipidemia, hypertension, history of breast cancer status post mastectomy.  Patient was initially seen in consultation by Dr. Kipp Brood on 10/04/2019, office visit reviewed.  Dr. Kipp Brood felt as if it be better for [place] to follow-up in pulmonary clinic instead of surgery.  Additionally patient was seen in December 2020 by cardiology Dr. Marlou Porch office note reviewed.  Currently being managed for atrial fibrillation permanent A. fib on Eliquis.  Patient had a prior diagnosis of pulmonary hypertension however follow-up echocardiogram reveals normal RV function.  Patient states that she has had lung nodules for several years.  Dating back since 2015.  She also had images in 2017 from an abdominal CT which showed a 4 mm lower lobe lesion.  She ultimately had follow-up for this.  She had a CT scan in December 2020 which revealed numerous bilateral pulmonary nodules both solid subsolid and groundglass.  The right middle lobe nodule stable since the 2017 and compared to 7.  This is considered benign.  A new right lower lobe nodule in the posterior aspect which is slightly enlarged.  Otherwise stable.  Patient had pet imaging for follow-up in January 2021 this revealed multiple bilateral  pulmonary nodules again all of which were nonspecific.  Patient had no evidence of axillary supraclavicular hilar adenopathy there is a small 1 cm low right paratracheal lymph node with an SUV of 3.05.  The remainder of the lesions there is a small subpleural right lower lobe lesion at 1.1 cm with an SUV max of 2.19 the remainder nodules subpleural within the apex with an SUV max 2.12.  Multiple scattered other nodules.  All subcentimeter in size.  OV 11/06/2019 telephone visit.  Spoke with the patient today about ongoing dry cough.  She has had this for some time.  She was just seen in the office a few weeks ago.  She states that she forgot to talk about the dry cough because she was more worried about the lung nodules that she was referred for.  She does state that the dry cough has been persistent for some time.  I explained that it could potentially be related to the things that we seen in her chest cavity but she also has a history of gastroesophageal reflux disease and currently only takes a PPI every other day.  She also stays up very late goes to bed past midnight and eats when she wants.  She also sleeps flat in the bed.  She does not have any significant swallowing trouble.  Cough is nonproductive.  Patient denies hemoptysis.   Observations/Objective:  Patient able to speak in complete sentences.  Appears to be in no distress.  While on the phone she never coughed.  Assessment and Plan:  Chronic cough, per patient has been going on for several months. Dry nonproductive. History of gastroesophageal  reflux partially treated Multiple pulmonary nodules  Plan: Restart PPI daily Instructed to take medication 30 minutes prior to first meal. Encouraged sleeping with the head of bed elevated. Counseled on appropriate medications to prevent reflux symptoms. If this was to continue to be a problem she could take her PPI twice daily.  She was also instructed on this. She also has follow-up with PCP  scheduled. CAT scan ordered for 81-month follow-up at last office visit.  Patient to follow-up with me after these images complete  Follow Up Instructions:  Follow-up with me after CT scan for lung nodules.   I discussed the assessment and treatment plan with the patient. The patient was provided an opportunity to ask questions and all were answered. The patient agreed with the plan and demonstrated an understanding of the instructions.   The patient was advised to call back or seek an in-person evaluation if the symptoms worsen or if the condition fails to improve as anticipated.  I provided 21 minutes of non-face-to-face time during this encounter.   Garner Nash, DO

## 2019-11-12 ENCOUNTER — Other Ambulatory Visit: Payer: Medicare Other

## 2019-11-12 ENCOUNTER — Ambulatory Visit: Payer: Medicare Other

## 2019-11-12 ENCOUNTER — Ambulatory Visit: Payer: Medicare Other | Attending: Internal Medicine

## 2019-11-12 DIAGNOSIS — Z23 Encounter for immunization: Secondary | ICD-10-CM | POA: Insufficient documentation

## 2019-11-12 NOTE — Progress Notes (Signed)
   Covid-19 Vaccination Clinic  Name:  Adrienne [name]    MRN: [id number] DOB: 12-05-1927  11/12/2019  [name] was observed post Covid-19 immunization for 15 minutes without incident. She was provided with Vaccine Information Sheet and instruction to access the V-Safe system.   [name] was instructed to call 911 with any severe reactions post vaccine: Marland Kitchen Difficulty breathing  . Swelling of face and throat  . A fast heartbeat  . A bad rash all over body  . Dizziness and weakness   Immunizations Administered    Name Date Dose VIS Date Route   Pfizer COVID-19 Vaccine 11/12/2019  2:00 PM 0.3 mL 08/23/2019 Intramuscular   Manufacturer: [employer]   Lot: [id number]   [place]: [id number]

## 2019-11-19 ENCOUNTER — Ambulatory Visit
Admission: RE | Admit: 2019-11-19 | Discharge: 2019-11-19 | Disposition: A | Discharge status: Home / Self Care | Payer: Medicare Other | Source: Ambulatory Visit | Attending: Oncology | Admitting: Oncology

## 2019-11-19 ENCOUNTER — Ambulatory Visit: Payer: Medicare Other

## 2019-11-19 ENCOUNTER — Other Ambulatory Visit: Payer: Self-pay

## 2019-11-19 DIAGNOSIS — M8580 Other specified disorders of bone density and structure, unspecified site: Secondary | ICD-10-CM

## 2019-11-19 DIAGNOSIS — Z170 Estrogen receptor positive status [ER+]: Secondary | ICD-10-CM

## 2019-11-19 DIAGNOSIS — I69398 Other sequelae of cerebral infarction: Secondary | ICD-10-CM

## 2019-11-19 DIAGNOSIS — I4819 Other persistent atrial fibrillation: Secondary | ICD-10-CM

## 2019-11-19 DIAGNOSIS — C50811 Malignant neoplasm of overlapping sites of right female breast: Secondary | ICD-10-CM

## 2019-11-19 DIAGNOSIS — Z1231 Encounter for screening mammogram for malignant neoplasm of breast: Secondary | ICD-10-CM | POA: Diagnosis not present

## 2019-11-21 ENCOUNTER — Other Ambulatory Visit: Payer: Self-pay | Admitting: Oncology

## 2019-11-21 DIAGNOSIS — R928 Other abnormal and inconclusive findings on diagnostic imaging of breast: Secondary | ICD-10-CM

## 2019-12-03 DIAGNOSIS — N183 Chronic kidney disease, stage 3 unspecified: Secondary | ICD-10-CM | POA: Diagnosis not present

## 2019-12-03 DIAGNOSIS — I4891 Unspecified atrial fibrillation: Secondary | ICD-10-CM | POA: Diagnosis not present

## 2019-12-03 DIAGNOSIS — E7800 Pure hypercholesterolemia, unspecified: Secondary | ICD-10-CM | POA: Diagnosis not present

## 2019-12-03 DIAGNOSIS — D509 Iron deficiency anemia, unspecified: Secondary | ICD-10-CM | POA: Diagnosis not present

## 2019-12-03 DIAGNOSIS — I2510 Atherosclerotic heart disease of native coronary artery without angina pectoris: Secondary | ICD-10-CM | POA: Diagnosis not present

## 2019-12-03 DIAGNOSIS — Z853 Personal history of malignant neoplasm of breast: Secondary | ICD-10-CM | POA: Diagnosis not present

## 2019-12-03 DIAGNOSIS — M810 Age-related osteoporosis without current pathological fracture: Secondary | ICD-10-CM | POA: Diagnosis not present

## 2019-12-03 DIAGNOSIS — I272 Pulmonary hypertension, unspecified: Secondary | ICD-10-CM | POA: Diagnosis not present

## 2019-12-05 ENCOUNTER — Other Ambulatory Visit: Payer: Self-pay | Admitting: Oncology

## 2019-12-05 ENCOUNTER — Other Ambulatory Visit: Payer: Self-pay

## 2019-12-05 ENCOUNTER — Ambulatory Visit
Admission: RE | Admit: 2019-12-05 | Discharge: 2019-12-05 | Disposition: A | Discharge status: Home / Self Care | Payer: Medicare Other | Source: Ambulatory Visit | Attending: Oncology | Admitting: Oncology

## 2019-12-05 DIAGNOSIS — N6322 Unspecified lump in the left breast, upper inner quadrant: Secondary | ICD-10-CM | POA: Diagnosis not present

## 2019-12-05 DIAGNOSIS — R928 Other abnormal and inconclusive findings on diagnostic imaging of breast: Secondary | ICD-10-CM

## 2019-12-05 DIAGNOSIS — N6012 Diffuse cystic mastopathy of left breast: Secondary | ICD-10-CM | POA: Diagnosis not present

## 2019-12-05 DIAGNOSIS — N6320 Unspecified lump in the left breast, unspecified quadrant: Secondary | ICD-10-CM

## 2019-12-05 DIAGNOSIS — R922 Inconclusive mammogram: Secondary | ICD-10-CM | POA: Diagnosis not present

## 2019-12-07 ENCOUNTER — Other Ambulatory Visit: Payer: Self-pay | Admitting: Cardiology

## 2019-12-09 NOTE — Telephone Encounter (Signed)
Age 86, weight 68kg, SCr 1.07 on 05/06/19, last OV Dec 2020, afib indication

## 2019-12-10 ENCOUNTER — Other Ambulatory Visit: Payer: Self-pay

## 2019-12-10 ENCOUNTER — Ambulatory Visit
Admission: RE | Admit: 2019-12-10 | Discharge: 2019-12-10 | Disposition: A | Discharge status: Home / Self Care | Payer: Medicare Other | Source: Ambulatory Visit | Attending: Oncology | Admitting: Oncology

## 2019-12-10 DIAGNOSIS — C50212 Malignant neoplasm of upper-inner quadrant of left female breast: Secondary | ICD-10-CM | POA: Diagnosis not present

## 2019-12-10 DIAGNOSIS — R928 Other abnormal and inconclusive findings on diagnostic imaging of breast: Secondary | ICD-10-CM

## 2019-12-10 DIAGNOSIS — N6322 Unspecified lump in the left breast, upper inner quadrant: Secondary | ICD-10-CM | POA: Diagnosis not present

## 2019-12-10 DIAGNOSIS — Z171 Estrogen receptor negative status [ER-]: Secondary | ICD-10-CM | POA: Diagnosis not present

## 2019-12-11 ENCOUNTER — Telehealth: Payer: Self-pay | Admitting: Oncology

## 2019-12-11 NOTE — Telephone Encounter (Signed)
Called pt per 3/31 sch message - unable to reach pt . Left message with appt date and time

## 2019-12-12 ENCOUNTER — Other Ambulatory Visit: Payer: Self-pay | Admitting: Oncology

## 2019-12-13 ENCOUNTER — Other Ambulatory Visit: Payer: Self-pay | Admitting: Oncology

## 2019-12-13 DIAGNOSIS — Z170 Estrogen receptor positive status [ER+]: Secondary | ICD-10-CM

## 2019-12-13 DIAGNOSIS — C50811 Malignant neoplasm of overlapping sites of right female breast: Secondary | ICD-10-CM

## 2019-12-13 NOTE — Progress Notes (Signed)
Dr. Owens Shark called me to let me know the patient has developed multiple cysts in her remaining breast.  She suggested we check an estradiol level and this is being added.

## 2019-12-18 DIAGNOSIS — N3020 Other chronic cystitis without hematuria: Secondary | ICD-10-CM | POA: Diagnosis not present

## 2019-12-19 NOTE — Progress Notes (Signed)
[place]  Telephone:([phone] Fax:([phone]) [phone]     ID: Adrienne [name] DOB: [birth year]  MR#: [id number]  HQR#:[id number]  Patient Care Team: Adrienne Lass, MD as PCP - General (Family Medicine) Adrienne [name], Adrienne All, MD as Consulting Physician (Obstetrics and Gynecology) Adrienne [name], Adrienne Dad, MD as Consulting Physician (Oncology) Adrienne Pain, MD as Consulting Physician (Cardiology) Adrienne Levans, MD as Referring Physician (Dermatology) Adrienne Nash, DO as Consulting Physician (Pulmonary Disease) Adrienne Bookbinder, MD as Consulting Physician (General Surgery) Adrienne Cruel, MD OTHER MD:  CHIEF COMPLAINT: Breast cancer estrogen receptor positive (s/p right mastectomy)  CURRENT TREATMENT: Observation   INTERVAL HISTORY: Adrienne [name] returns today for evaluation of her newly diagnosed left breast cancer. She was initially evaluated in the breast cancer clinic on 08/14/2019 for her history of right breast cancer in 2012.  That surgery and treatments was [name] done in [place] and we have very limited records.  Since her last visit, she underwent chest CT on 09/09/2019 showing: numerous bilateral nodules; enlarged right lower lobe nodule along the posterior right hemidiaphragm.  She also underwent PET scan on 09/20/2019 showing: multifocal bilateral pulmonary nodules, with nonspecific mild FDG uptake; nonspecific mild FDG uptake associated with low right paratracheal lymph node.  She had routine left screening mammography on 11/19/2019 showing a possible abnormality. She underwent left diagnostic mammography with tomography and left breast ultrasonography at [facility] on 12/05/2019 showing: breast density category C; 0.7 cm left breast mass at 11 o'clock; development of three left breast cysts compared to prior imaging from [place] in 11/2017; no left axillary adenopathy.  Accordingly on 12/10/2019 she proceeded to biopsy of the left breast area in  question. The pathology from this procedure (Adrienne [name]) showed: invasive ductal carcinoma, grade 3; ductal carcinoma in situ. Prognostic indicators significant for: estrogen receptor, 0% negative and progesterone receptor, 0% negative. Proliferation marker Ki67 at 90%. HER2 negative by immunohistochemistry (0).  Of note, she is scheduled for bone density screening on 01/10/2020 and for chest CT on 01/15/2020.   REVIEW OF SYSTEMS: Adrienne [name] is doing remarkably well for her age.  She does her own income tax, packages presents for her grandchildren on the birthdays, and does [name] her own activities of daily living and housework and shopping.  She does not belong to a gym or walk outside the home except on errands, but they are thinking of getting a treadmill so they can do some exercise on rainy days.  She has no unusual headaches, she tells me her vision has not changed, denies nausea or vomiting, has a mild cough only in the evening just before going to bed, which is nonproductive, no pleurisy, no fever, no change in bowel or bladder habits.  She is looking forward to her 89th birthday next week   HISTORY OF CURRENT ILLNESS: From the original intake note:  Adrienne [name] has a right breast mass noted by her primary care doctor in [place] in 2012.  She was referred to surgery and underwent right mastectomy and axillary lymph node dissection or sampling (we do not have the pathology report) 06/04/2011 for what appears to have been a multicentric invasive ductal carcinoma, grade 2.  There were three separate tumor foci, spanning a total of 3.8 cm. Prognostic indicators were: ER positive, PR positive, and Her2 negative. One out of 3 biopsied lymph nodes was positive for micrometastasis.   The patient's subsequent history is as detailed below.   PAST MEDICAL HISTORY: Past Medical History:  Diagnosis Date  . Afib ([place])   . Arthritis of ankle   . CAD (coronary artery disease)   . Cancer [facility])  2012   breast cancer-right  . Compression fracture of T12 vertebra ([facility])   . Diverticulosis   . Edema   . Elevated uric acid in blood   . GI bleed 12/2015  . Hyperlipidemia   . Hypertension   . Long-term (current) use of anticoagulants   . Moderate mitral regurgitation   . Pulmonary HTN ([place])    Echo 1/19: EF 60-65, no RWMA, Ao sclerosis, trivial AI, mild MR, mod BAE, mod TR, PASP 49  . Stroke ([place]) 01/25/2016  . TIA (transient ischemic attack) 09/2011  . Urinary incontinence   . Urinary retention     PAST SURGICAL HISTORY: Past Surgical History:  Procedure Laterality Date  . APPENDECTOMY    . BREAST SURGERY  2012   Rt.mastectomy--[place], [place]  2013  . CATARACT EXTRACTION, BILATERAL    . DILATION AND CURETTAGE OF UTERUS     in her early 74's for DUB  . ESOPHAGOGASTRODUODENOSCOPY Left 01/08/2016   Procedure: ESOPHAGOGASTRODUODENOSCOPY (EGD);  Surgeon: Arta Silence, MD;  Location: [facility];  Service: Endoscopy;  Laterality: Left;  . GALLBLADDER SURGERY    . MASTECTOMY Right   . RADIOLOGY WITH ANESTHESIA N/A 01/25/2016   Procedure: RADIOLOGY WITH ANESTHESIA;  Surgeon: Luanne Bras, MD;  Location: [facility];  Service: Radiology;  Laterality: N/A;  . REPLACEMENT TOTAL KNEE BILATERAL    . TONSILLECTOMY AND ADENOIDECTOMY     as a child    FAMILY HISTORY: Family History  Problem Relation Age of Onset  . Asthma Mother   . CVA Father   . Atrial fibrillation Sister        HAS PACER  . Pneumonia Brother   . Cancer Daughter        COLON CANCER  . Cancer Maternal Grandmother        breast cancer   Patient's father was 40 years old when he died from stroke. Patient's mother died from coronary artery disease at age 71. The patient denies a family hx of ovarian cancer. She reports breast cancer in her maternal grandmother. She has 2 siblings, 1 brother and 1 sister. Her brother died from stroke/AIDS. She also reports pancreatic cancer in her  paternal grandmother and colon cancer in her daughter.   GYNECOLOGIC HISTORY:  Patient's last menstrual period was 09/12/1973 (approximate). Menarche: 86 years old Age at first live birth: 86 years old [place] P 3 LMP late 22s Contraceptive never HRT yes, remote , less than 6 months Hysterectomy?  No BSO?  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  Her husband [name] is a Marketing executive, (PhD, worked at [employer]).  Their children are Lockie Pares who lives in [place] and whose husband is a English as a second language teacher; Blair [employer]") who lives in [place] and works in finances; and C[place]ton, who lives in [place] and works for [employer].  The patient has 11 grandchildren and 8 great-grandchildren.  She attends [employer] locally    ADVANCED DIRECTIVES: In the absence of documents to the contrary the patient's husband is her healthcare power of attorney   HEALTH MAINTENANCE: Social History   Tobacco Use  . Smoking status: Former Smoker    Packs/day: 0.25    Years: 2.00  Pack years: 0.50    Types: Cigarettes    Quit date: 1950    Years since quitting: 71.3  . Smokeless tobacco: Never Used  Substance Use Topics  . Alcohol use: No    Alcohol/week: 0.0 standard drinks  . Drug use: No     Colonoscopy: Remote  PAP: 11/2015, negative  Bone density: Osteopenia   Allergies  Allergen Reactions  . Valium [Diazepam] Other (See Comments)    HYPERACTIVITY   . Amiodarone Hcl [Amiodarone] Other (See Comments)    AFIB  . Compazine [Prochlorperazine] Other (See Comments)    HYPERACTIVITY   . Other Other (See Comments)    "Kidney dye"--patient states this gave her severe shakes/chills  . Thorazine [Chlorpromazine] Other (See Comments)    HYPERACTIVITY   . Zometa [Zoledronic Acid] Other (See Comments)    PROBLEMS WITH EYES  TIA  . Ciprofloxacin Swelling    "swelling" in Rt.elbow     Current Outpatient Medications  Medication Sig Dispense Refill  . acetaminophen (TYLENOL) 500 MG tablet Take 500-1,000 mg by mouth every 12 (twelve) hours as needed ([name]).     Marland Kitchen amoxicillin (AMOXIL) 500 MG capsule Take 2,000 mg by mouth as needed. Only takes as preventative prior to dentist    . Ascorbic Acid (VITAMIN C) 1000 MG tablet Take 1,000 mg by mouth daily.    Marland Kitchen atorvastatin (LIPITOR) 10 MG tablet TAKE 1 TABLET DAILY 90 tablet 3  . Calcium Citrate 250 MG TABS Take 5 tablets by mouth daily.    Marland Kitchen diltiazem (CARDIZEM) 60 MG tablet TAKE 1 TABLET THREE TIMES A DAY 270 tablet 3  . ELIQUIS 5 MG TABS tablet TAKE 1 TABLET TWICE A DAY 180 tablet 1  . furosemide (LASIX) 20 MG tablet Take 1 tablet (20 mg total) by mouth daily. 90 tablet 3  . metoprolol tartrate (LOPRESSOR) 100 MG tablet TAKE 1 TABLET TWICE A DAY 180 tablet 3  . Multiple Vitamins-Minerals (MULTIVITAMIN ADULT PO) Take 1 tablet by mouth daily.     . nitrofurantoin (MACRODANTIN) 100 MG capsule Take 1 capsule by mouth daily.    Marland Kitchen omeprazole (PRILOSEC) 10 MG capsule Take 10 mg by mouth every other day.     No current facility-administered medications for this visit.    OBJECTIVE: White woman who appears younger than stated age  66:   12/20/19 1023  BP: 140/86  Pulse: 65  Resp: 18  Temp: 98.5 F (36.9 C)  SpO2: 96%     Body mass index is 29.31 kg/m.   Wt Readings from Last 3 Encounters:  12/20/19 152 lb 9.6 oz (69.2 kg)  10/29/19 150 lb 12.8 oz (68.4 kg)  10/17/19 153 lb (69.4 kg)      ECOG FS:1 - Symptomatic but completely ambulatory  Sclerae unicteric, EOMs intact Wearing a mask No cervical or supraclavicular adenopathy Lungs no rales or rhonchi Heart regular rate and rhythm Abd soft, nontender, positive bowel sounds MSK no focal spinal tenderness, no upper extremity lymphedema Neuro: nonfocal, well oriented, appropriate affect Breasts: The right breast is status post mastectomy.  There is no evidence  of local recurrence.  The left breast is status post recent biopsy.  There is a significant ecchymosis.  Both axillae are benign.   LAB RESULTS:  CMP     Component Value Date/Time   NA 141 12/20/2019 1010   NA 145 (H) 05/06/2019 1608   K 4.2 12/20/2019 1010   CL 105 12/20/2019 1010   CO2 28 12/20/2019  1010   GLUCOSE 92 12/20/2019 1010   BUN 16 12/20/2019 1010   BUN 19 05/06/2019 1608   CREATININE 1.06 (H) 12/20/2019 1010   CALCIUM 9.9 12/20/2019 1010   PROT 7.8 12/20/2019 1010   PROT 6.6 05/06/2019 1608   ALBUMIN 3.8 12/20/2019 1010   ALBUMIN 3.9 05/06/2019 1608   AST 13 (L) 12/20/2019 1010   ALT 12 12/20/2019 1010   ALKPHOS 93 12/20/2019 1010   BILITOT 0.5 12/20/2019 1010   BILITOT 0.4 05/06/2019 1608   GFRNONAA 47 (L) 12/20/2019 1010   GFRAA 54 (L) 12/20/2019 1010    No results found for: TOTALPROTELP, ALBUMINELP, A1GS, A2GS, BETS, BETA2SER, GAMS, MSPIKE, SPEI  No results found for: KPAFRELGTCHN, LAMBDASER, KAPLAMBRATIO  Lab Results  Component Value Date   WBC 8.8 12/20/2019   NEUTROABS 6.6 12/20/2019   HGB 12.4 12/20/2019   HCT 39.0 12/20/2019   MCV 92.6 12/20/2019   PLT 234 12/20/2019    @LASTCHEMISTRY @  No results found for: LABCA2  No components found for: DSKAJG811  No results for input(s): INR in the last 168 hours.  No results found for: LABCA2  No results found for: XBW620  No results found for: BTD974  No results found for: BUL845  No results found for: CA2729  No components found for: HGQUANT  No results found for: CEA1 / No results found for: CEA1   No results found for: AFPTUMOR  No results found for: CHROMOGRNA  No results found for: HGBA, HGBA2QUANT, HGBFQUANT, HGBSQUAN (Hemoglobinopathy evaluation)   No results found for: LDH  No results found for: IRON, TIBC, IRONPCTSAT (Iron and TIBC)  No results found for: FERRITIN  Urinalysis    Component Value Date/Time   COLORURINE YELLOW 08/29/2017 2343   APPEARANCEUR CLOUDY  (A) 08/29/2017 2343   LABSPEC 1.005 08/29/2017 2343   PHURINE 6.0 08/29/2017 2343   GLUCOSEU NEGATIVE 08/29/2017 2343   HGBUR SMALL (A) 08/29/2017 2343   BILIRUBINUR n 02/08/2018 1651   KETONESUR NEGATIVE 08/29/2017 2343   PROTEINUR Positive (A) 02/08/2018 1651   PROTEINUR NEGATIVE 08/29/2017 2343   UROBILINOGEN 0.2 02/08/2018 1651   NITRITE positive 02/08/2018 1651   NITRITE NEGATIVE 08/29/2017 2343   LEUKOCYTESUR Large (3+) (A) 02/08/2018 1651    STUDIES: US BREAST COMPLETE UNI LEFT INC AXILLA  Addendum Date: 12/10/2019   ADDENDUM REPORT: 12/10/2019 16:09 ADDENDUM: The current studies are compared to multiple prior studies performed in [place] dated 2019 and earlier. There has been development of numerous cysts throughout the LEFT breast, a significant change since multiple prior studies. The patient denies any exogenous source of estrogen. Consider further evaluation with estradiol levels to exclude estrogen secreting tumors. Electronically Signed   By: Nolon Nations M.D.   On: 12/10/2019 16:09   Result Date: 12/10/2019 CLINICAL DATA:  Patient recalled from screening for left breast asymmetry and multiple left breast masses. EXAM: DIGITAL DIAGNOSTIC LEFT MAMMOGRAM WITH CAD AND TOMO ULTRASOUND LEFT BREAST COMPARISON:  Previous exam(s). ACR Breast Density Category c: The breast tissue is heterogeneously dense, which may obscure small masses. FINDINGS: Interval development of multiple oval circumscribed masses throughout the central aspect of the left breast and medial aspect of the left breast. Mammographic images were processed with CAD. Targeted ultrasound is performed, showing multiple cysts within the left breast. There is a 1.1 x 0.9 x 0.6 cm cyst left breast 1 o'clock position 3 cm from nipple. There is a 0.5 x 0.5 x 0.2 cm cyst left breast 8 o'clock position 7  cm from nipple. There is a 0.9 x 0.7 x 0.9 cm cyst left breast 6 o'clock position 2 cm from nipple. Within the left breast 11  o'clock position 3 cm from nipple there is a 0.7 x 0.4 x 0.5 cm lobular hypoechoic mass. No left axillary adenopathy. IMPRESSION: Indeterminate left breast mass 11 o'clock position 3 cm from nipple. Multiple cysts within the left breast. RECOMMENDATION: Ultrasound-guided core needle biopsy left breast mass 11 o'clock position 3 cm from nipple. I have discussed the findings and recommendations with the patient. If applicable, a reminder letter will be sent to the patient regarding the next appointment. BI-RADS CATEGORY  4: Suspicious. Electronically Signed: By: Lovey Newcomer M.D. On: 12/05/2019 12:30   MM DIAG BREAST TOMO UNI LEFT  Addendum Date: 12/10/2019   ADDENDUM REPORT: 12/10/2019 16:09 ADDENDUM: The current studies are compared to multiple prior studies performed in [place] dated 2019 and earlier. There has been development of numerous cysts throughout the LEFT breast, a significant change since multiple prior studies. The patient denies any exogenous source of estrogen. Consider further evaluation with estradiol levels to exclude estrogen secreting tumors. Electronically Signed   By: Nolon Nations M.D.   On: 12/10/2019 16:09   Result Date: 12/10/2019 CLINICAL DATA:  Patient recalled from screening for left breast asymmetry and multiple left breast masses. EXAM: DIGITAL DIAGNOSTIC LEFT MAMMOGRAM WITH CAD AND TOMO ULTRASOUND LEFT BREAST COMPARISON:  Previous exam(s). ACR Breast Density Category c: The breast tissue is heterogeneously dense, which may obscure small masses. FINDINGS: Interval development of multiple oval circumscribed masses throughout the central aspect of the left breast and medial aspect of the left breast. Mammographic images were processed with CAD. Targeted ultrasound is performed, showing multiple cysts within the left breast. There is a 1.1 x 0.9 x 0.6 cm cyst left breast 1 o'clock position 3 cm from nipple. There is a 0.5 x 0.5 x 0.2 cm cyst left breast 8 o'clock position 7 cm  from nipple. There is a 0.9 x 0.7 x 0.9 cm cyst left breast 6 o'clock position 2 cm from nipple. Within the left breast 11 o'clock position 3 cm from nipple there is a 0.7 x 0.4 x 0.5 cm lobular hypoechoic mass. No left axillary adenopathy. IMPRESSION: Indeterminate left breast mass 11 o'clock position 3 cm from nipple. Multiple cysts within the left breast. RECOMMENDATION: Ultrasound-guided core needle biopsy left breast mass 11 o'clock position 3 cm from nipple. I have discussed the findings and recommendations with the patient. If applicable, a reminder letter will be sent to the patient regarding the next appointment. BI-RADS CATEGORY  4: Suspicious. Electronically Signed: By: Lovey Newcomer M.D. On: 12/05/2019 12:30   MM CLIP PLACEMENT LEFT  Result Date: 12/10/2019 CLINICAL DATA:  Status post ultrasound-guided core biopsy of mass in the 11 o'clock location of the LEFT breast. EXAM: DIAGNOSTIC LEFT MAMMOGRAM POST ULTRASOUND BIOPSY COMPARISON:  Previous exam(s). FINDINGS: Mammographic images were obtained following ultrasound guided biopsy of mass in the 11 o'clock location of the LEFT breast and placement of a ribbon shaped clip. The biopsy marking clip is in expected position at the site of biopsy. IMPRESSION: Appropriate positioning of the ribbon shaped biopsy marking clip at the site of biopsy in the UPPER INNER QUADRANT LEFT breast. Final Assessment: Post Procedure Mammograms for Marker Placement Electronically Signed   By: Nolon Nations M.D.   On: 12/10/2019 16:03   [place] LT BREAST BX W LOC DEV 1ST LESION IMG BX [place] US GUIDE  Addendum Date: 12/12/2019   ADDENDUM REPORT: 12/11/2019 15:51 ADDENDUM: Pathology revealed GRADE III INVASIVE DUCTAL CARCINOMA. DUCTAL CARCINOMA IN SITU of the LEFT breast, 11 o'clock, upper inner, ribbon clip. This was found to be concordant by Dr. Nolon Nations. Pathology results were discussed with the patient by telephone. The patient reported doing well after the biopsy  with tenderness at the site. Post biopsy instructions and care were reviewed and questions were answered. The patient was encouraged to call [facility] for any additional concerns. Given the interval increased number of cysts throughout the LEFT breast since 2019, and lack of exogenous source of estrogen, recommend correlation with estradiol levels to exclude possible estrogen secreting tumors. Per patient request, appointment has been arranged with Dr. Lurline Del of [facility] on December 20, 2019. Pathology results reported by Stacie Acres RN on 12/11/2019. The findings are discussed with Dr. Jana Hakim. Electronically Signed   By: Nolon Nations M.D.   On: 12/11/2019 15:51   Result Date: 12/12/2019 CLINICAL DATA:  Patient presents for ultrasound-guided core biopsy of mass in the 11 o'clock location of the LEFT breast. EXAM: ULTRASOUND GUIDED LEFT BREAST CORE NEEDLE BIOPSY COMPARISON:  Previous exam(s). PROCEDURE: I met with the patient and we discussed the procedure of ultrasound-guided biopsy, including benefits and alternatives. We discussed the high likelihood of a successful procedure. We discussed the risks of the procedure, including infection, bleeding, tissue injury, clip migration, and inadequate sampling. Informed written consent was given. The usual time-out protocol was performed immediately prior to the procedure. Lesion quadrant: UPPER INNER QUADRANT LEFT breast Using sterile technique and 1% Lidocaine as local anesthetic, under direct ultrasound visualization, a 14 gauge spring-loaded device was used to perform biopsy of mass in the 11 o'clock location of the LEFT breast using a LATERAL to MEDIAL approach. At the conclusion of the procedure ribbon shaped tissue marker clip was deployed into the biopsy cavity. Follow up 2 view mammogram was performed and dictated separately. IMPRESSION: Ultrasound guided biopsy of LEFT breast mass. No apparent  complications. Electronically Signed: By: Nolon Nations M.D. On: 12/10/2019 16:01    ELIGIBLE FOR AVAILABLE RESEARCH PROTOCOL: No  ASSESSMENT: 86 y.o. [place] woman   RIGHT BREAST CANCER (1) status post right mastectomy and sentinel lymph node sampling September 2012 for what appears to have been a multifocal T1-T2 N1 (mic) invasive ductal carcinoma, estrogen and progesterone receptor positive, HER-2 not amplified  (2) completed 5 years of anastrozole 2017  LEFT BREAST CANCER (3) status post left breast upper inner quadrant biopsy 12/10/2019 for a clinical T1b N0, stage IB invasive ductal carcinoma grade 3, triple negative, with an MIB-1 of 90%  (4) definitive surgery pending: Patient opts for mastectomy  (5) adjuvant chemotherapy discussed, opted against  (6) meets criteria for genetics testing   PLAN: Mortimer Fries has a contralateral breast cancer.  This is small but nasty, with a very fast rate of growth and very aggressive morphology.  In a younger woman we would plan on chemotherapy.  Given her age, despite her excellent functional status, I would prefer to hold systemic treatment particularly since the tumor is less than a centimeter.  We discussed this at length today.  We then discussed the surgical options which are basically lumpectomy plus radiation versus mastectomy.  Because she already had a mastectomy on the contralateral side (and therefore a second mastectomy would improve symmetry) and because she would prefer to avoid radiation, her choice would be mastectomy.  We are placing surgical referral so this can be accomplished as soon as possible for her.  We also discussed the lung nodules that were noted in December.  I have reviewed the CT scan and the PET scan.  Given the low level of uptake on the PET scan I do not think that these are malignant and they require only follow-up.  This is already scheduled through Dr. Valeta Harms with a repeat CT of the chest in May.  Note that  the patient is on Eliquis and this will have to be briefly interrupted for the surgery.  Total encounter time 60 minutes.Adrienne Cruel, MD   12/20/2019 2:32 PM Medical Oncology and Hematology [facility] [place], [place] [address] Tel. [phone]    Fax. [phone]   This document serves as a record of services personally performed by Lurline Del, MD. It was created on his behalf by Wilburn Mylar, a trained medical scribe. The creation of this record is based on the scribe's personal observations and the provider's statements to them.   I, Lurline Del MD, have reviewed the above documentation for accuracy and completeness, and I agree with the above.   *Total Encounter Time as defined by the Centers for Medicare and Medicaid Services includes, in addition to the face-to-face time of a patient visit (documented in the note above) non-face-to-face time: obtaining and reviewing outside history, ordering and reviewing medications, tests or procedures, care coordination (communications with other health care professionals or caregivers) and documentation in the medical record.

## 2019-12-20 ENCOUNTER — Inpatient Hospital Stay: Payer: Medicare Other

## 2019-12-20 ENCOUNTER — Inpatient Hospital Stay: Payer: Medicare Other | Attending: Oncology | Admitting: Oncology

## 2019-12-20 ENCOUNTER — Encounter: Payer: Self-pay | Admitting: *Deleted

## 2019-12-20 ENCOUNTER — Other Ambulatory Visit: Payer: Self-pay

## 2019-12-20 VITALS — BP 140/86 | HR 65 | Temp 98.5°F | Resp 18 | Ht 60.5 in | Wt 152.6 lb

## 2019-12-20 DIAGNOSIS — C50811 Malignant neoplasm of overlapping sites of right female breast: Secondary | ICD-10-CM

## 2019-12-20 DIAGNOSIS — I63412 Cerebral infarction due to embolism of left middle cerebral artery: Secondary | ICD-10-CM | POA: Diagnosis not present

## 2019-12-20 DIAGNOSIS — Z853 Personal history of malignant neoplasm of breast: Secondary | ICD-10-CM | POA: Insufficient documentation

## 2019-12-20 DIAGNOSIS — Z171 Estrogen receptor negative status [ER-]: Secondary | ICD-10-CM | POA: Diagnosis not present

## 2019-12-20 DIAGNOSIS — K2100 Gastro-esophageal reflux disease with esophagitis, without bleeding: Secondary | ICD-10-CM

## 2019-12-20 DIAGNOSIS — I4819 Other persistent atrial fibrillation: Secondary | ICD-10-CM | POA: Diagnosis not present

## 2019-12-20 DIAGNOSIS — Z170 Estrogen receptor positive status [ER+]: Secondary | ICD-10-CM | POA: Diagnosis not present

## 2019-12-20 DIAGNOSIS — I482 Chronic atrial fibrillation, unspecified: Secondary | ICD-10-CM

## 2019-12-20 DIAGNOSIS — I272 Pulmonary hypertension, unspecified: Secondary | ICD-10-CM

## 2019-12-20 DIAGNOSIS — Z9011 Acquired absence of right breast and nipple: Secondary | ICD-10-CM | POA: Insufficient documentation

## 2019-12-20 DIAGNOSIS — I69398 Other sequelae of cerebral infarction: Secondary | ICD-10-CM

## 2019-12-20 DIAGNOSIS — M8580 Other specified disorders of bone density and structure, unspecified site: Secondary | ICD-10-CM | POA: Insufficient documentation

## 2019-12-20 DIAGNOSIS — Z87891 Personal history of nicotine dependence: Secondary | ICD-10-CM | POA: Diagnosis not present

## 2019-12-20 DIAGNOSIS — C50212 Malignant neoplasm of upper-inner quadrant of left female breast: Secondary | ICD-10-CM

## 2019-12-20 LAB — COMPREHENSIVE METABOLIC PANEL
ALT: 12 U/L (ref 0–44)
AST: 13 U/L — ABNORMAL LOW (ref 15–41)
Albumin: 3.8 g/dL (ref 3.5–5.0)
Alkaline Phosphatase: 93 U/L (ref 38–126)
Anion gap: 8 (ref 5–15)
BUN: 16 mg/dL (ref 8–23)
CO2: 28 mmol/L (ref 22–32)
Calcium: 9.9 mg/dL (ref 8.9–10.3)
Chloride: 105 mmol/L (ref 98–111)
Creatinine, Ser: 1.06 mg/dL — ABNORMAL HIGH (ref 0.44–1.00)
GFR calc Af Amer: 54 mL/min — ABNORMAL LOW (ref >60)
GFR calc non Af Amer: 47 mL/min — ABNORMAL LOW (ref >60)
Glucose, Bld: 92 mg/dL (ref 70–99)
Potassium: 4.2 mmol/L (ref 3.5–5.1)
Sodium: 141 mmol/L (ref 135–145)
Total Bilirubin: 0.5 mg/dL (ref 0.3–1.2)
Total Protein: 7.8 g/dL (ref 6.5–8.1)

## 2019-12-20 LAB — CBC WITH DIFFERENTIAL/PLATELET
Abs Immature Granulocytes: 0.02 10*3/uL (ref 0.00–0.07)
Basophils Absolute: 0 10*3/uL (ref 0.0–0.1)
Basophils Relative: 0 %
Eosinophils Absolute: 0.1 10*3/uL (ref 0.0–0.5)
Eosinophils Relative: 1 %
HCT: 39 % (ref 36.0–46.0)
Hemoglobin: 12.4 g/dL (ref 12.0–15.0)
Immature Granulocytes: 0 %
Lymphocytes Relative: 15 %
Lymphs Abs: 1.3 10*3/uL (ref 0.7–4.0)
MCH: 29.5 pg (ref 26.0–34.0)
MCHC: 31.8 g/dL (ref 30.0–36.0)
MCV: 92.6 fL (ref 80.0–100.0)
Monocytes Absolute: 0.8 10*3/uL (ref 0.1–1.0)
Monocytes Relative: 9 %
Neutro Abs: 6.6 10*3/uL (ref 1.7–7.7)
Neutrophils Relative %: 75 %
Platelets: 234 10*3/uL (ref 150–400)
RBC: 4.21 MIL/uL (ref 3.87–5.11)
RDW: 13.7 % (ref 11.5–15.5)
WBC: 8.8 10*3/uL (ref 4.0–10.5)
nRBC: 0 % (ref 0.0–0.2)

## 2019-12-23 ENCOUNTER — Telehealth: Payer: Self-pay | Admitting: Oncology

## 2019-12-23 DIAGNOSIS — C50412 Malignant neoplasm of upper-outer quadrant of left female breast: Secondary | ICD-10-CM | POA: Diagnosis not present

## 2019-12-23 NOTE — Telephone Encounter (Signed)
Scheduled appts per 4/9 los. Pt confirmed appt date and time.

## 2019-12-24 ENCOUNTER — Encounter: Payer: Self-pay | Admitting: *Deleted

## 2019-12-28 LAB — ESTRADIOL, ULTRA SENS: Estradiol, Sensitive: 7.1 pg/mL

## 2019-12-31 DIAGNOSIS — C50412 Malignant neoplasm of upper-outer quadrant of left female breast: Secondary | ICD-10-CM | POA: Diagnosis not present

## 2020-01-01 ENCOUNTER — Other Ambulatory Visit: Payer: Self-pay | Admitting: *Deleted

## 2020-01-01 ENCOUNTER — Encounter: Payer: Self-pay | Admitting: *Deleted

## 2020-01-01 ENCOUNTER — Telehealth: Payer: Self-pay | Admitting: Pulmonary Disease

## 2020-01-01 DIAGNOSIS — C50811 Malignant neoplasm of overlapping sites of right female breast: Secondary | ICD-10-CM

## 2020-01-01 NOTE — Telephone Encounter (Signed)
alled to let us know that she was DX with breast cancer - she would like to know if the CT can be moved - she will need the CT to be done between tomorrow and 01/08/20. Possibly the 29th in the afternoon - she is hoping to get it doen before her breast surgery - please advise [phone]

## 2020-01-02 NOTE — Telephone Encounter (Signed)
When I called [facility] to reschedule this CT for Adrienne [name] the only appt time that they had available was 01/03/2020 @ 1:50pm. I called and spoke with [name] about the CT appt change and she was grateful that we were able to get the CT rescheduled sooner for her.

## 2020-01-03 ENCOUNTER — Ambulatory Visit
Admission: RE | Admit: 2020-01-03 | Discharge: 2020-01-03 | Disposition: A | Discharge status: Home / Self Care | Payer: Medicare Other | Source: Ambulatory Visit | Attending: Pulmonary Disease | Admitting: Pulmonary Disease

## 2020-01-03 DIAGNOSIS — R918 Other nonspecific abnormal finding of lung field: Secondary | ICD-10-CM

## 2020-01-07 ENCOUNTER — Other Ambulatory Visit: Payer: Self-pay

## 2020-01-07 ENCOUNTER — Ambulatory Visit
Admission: RE | Admit: 2020-01-07 | Discharge: 2020-01-07 | Disposition: A | Discharge status: Home / Self Care | Payer: Medicare Other | Source: Ambulatory Visit | Attending: Radiation Oncology | Admitting: Radiation Oncology

## 2020-01-07 ENCOUNTER — Encounter: Payer: Self-pay | Admitting: Licensed Clinical Social Worker

## 2020-01-07 ENCOUNTER — Other Ambulatory Visit: Payer: Self-pay | Admitting: General Surgery

## 2020-01-07 ENCOUNTER — Encounter: Payer: Self-pay | Admitting: Radiation Oncology

## 2020-01-07 ENCOUNTER — Encounter: Payer: Self-pay | Admitting: *Deleted

## 2020-01-07 VITALS — BP 157/73 | HR 79 | Temp 98.0°F | Resp 20 | Ht 60.0 in | Wt 150.0 lb

## 2020-01-07 DIAGNOSIS — Z87891 Personal history of nicotine dependence: Secondary | ICD-10-CM | POA: Insufficient documentation

## 2020-01-07 DIAGNOSIS — I340 Nonrheumatic mitral (valve) insufficiency: Secondary | ICD-10-CM | POA: Diagnosis not present

## 2020-01-07 DIAGNOSIS — Z8673 Personal history of transient ischemic attack (TIA), and cerebral infarction without residual deficits: Secondary | ICD-10-CM | POA: Insufficient documentation

## 2020-01-07 DIAGNOSIS — I2510 Atherosclerotic heart disease of native coronary artery without angina pectoris: Secondary | ICD-10-CM | POA: Insufficient documentation

## 2020-01-07 DIAGNOSIS — R918 Other nonspecific abnormal finding of lung field: Secondary | ICD-10-CM | POA: Insufficient documentation

## 2020-01-07 DIAGNOSIS — Z79899 Other long term (current) drug therapy: Secondary | ICD-10-CM | POA: Insufficient documentation

## 2020-01-07 DIAGNOSIS — Z7901 Long term (current) use of anticoagulants: Secondary | ICD-10-CM | POA: Diagnosis not present

## 2020-01-07 DIAGNOSIS — I4891 Unspecified atrial fibrillation: Secondary | ICD-10-CM | POA: Insufficient documentation

## 2020-01-07 DIAGNOSIS — I10 Essential (primary) hypertension: Secondary | ICD-10-CM | POA: Diagnosis not present

## 2020-01-07 DIAGNOSIS — Z9221 Personal history of antineoplastic chemotherapy: Secondary | ICD-10-CM | POA: Insufficient documentation

## 2020-01-07 DIAGNOSIS — I272 Pulmonary hypertension, unspecified: Secondary | ICD-10-CM | POA: Insufficient documentation

## 2020-01-07 DIAGNOSIS — C50212 Malignant neoplasm of upper-inner quadrant of left female breast: Secondary | ICD-10-CM | POA: Diagnosis not present

## 2020-01-07 DIAGNOSIS — E785 Hyperlipidemia, unspecified: Secondary | ICD-10-CM | POA: Diagnosis not present

## 2020-01-07 DIAGNOSIS — Z171 Estrogen receptor negative status [ER-]: Secondary | ICD-10-CM | POA: Diagnosis not present

## 2020-01-07 DIAGNOSIS — Z9011 Acquired absence of right breast and nipple: Secondary | ICD-10-CM | POA: Diagnosis not present

## 2020-01-07 DIAGNOSIS — Z853 Personal history of malignant neoplasm of breast: Secondary | ICD-10-CM | POA: Diagnosis not present

## 2020-01-07 DIAGNOSIS — Z803 Family history of malignant neoplasm of breast: Secondary | ICD-10-CM | POA: Insufficient documentation

## 2020-01-07 NOTE — Progress Notes (Signed)
[place] Psychosocial Distress Screening Clinical Social Work  Clinical Social Work was referred by distress screening protocol.  The patient scored a 6 on the Psychosocial Distress Thermometer which indicates moderate distress. Clinical Social Worker contacted patient by phone to assess for distress and other psychosocial needs.   Patient reports feeling nervous as she decides between mastectomy versus lumpectomy and radiation. CSW normalized feelings and discussed common emotions with diagnosis. She has good support from her husband.  ONCBCN DISTRESS SCREENING 01/07/2020  Screening Type Initial Screening  Distress experienced in past week (1-10) 6  Emotional problem type Nervousness/Anxiety;Adjusting to illness  Physical Problem type Breathing  Other Contact via husbands cell phone [phone]    Clinical Social Worker follow up needed: No.  If yes, follow up plan:  Barrett Holthaus, [place], LCSW

## 2020-01-07 NOTE — Progress Notes (Signed)
Radiation Oncology         [phone] ________________________________  Initial Outpatient Consultation  Name: Adrienne [name] MRN: [id number]  Date: 01/07/2020  DOB: [birth year]  OH:YWVPXT, Lattie Haw, MD  Magrinat, Virgie Dad, MD   REFERRING PHYSICIAN: Magrinat, Virgie Dad, MD  DIAGNOSIS: The encounter diagnosis was Malignant neoplasm of upper-inner quadrant of left breast in female, estrogen receptor negative ([place]).  Clinical stage IB (T1b, N0) Left Breast UIQ, Invasive Ductal Carcinoma with DCIS, ER- / PR- / Her2-, Grade 3  HISTORY OF PRESENT ILLNESS::Adrienne [name] is a 86 y.o. female who is accompanied by her husband and daughter by phone contact. She had a routine screening mammography on 11/19/2019 showing possible masses ans asymmetry in the left breast. She underwent unilateral diagnostic mammography and left breast ultrasonography on 12/05/2019 that showed an indeterminate left breast mass at the 11 o'clock position, 3 cm from the nipple. There were also multiple cysts within the left breast. When compared to studies performed in [place] dated 2019 and earlier, there had been development of numerous cysts throughout the left breast, a significant chance.   Biopsy on 12/10/2019 showed grade 3 invasive ductal carcinoma with ductal carcinoma in situ. Prognostic indicators significant for estrogen receptor, 0% negative and progesterone receptor, 0% negative. Proliferation marker Ki67 at 90%. HER2 negative.  She was last seen by Dr. Jana Hakim on 12/20/2019, during which time definitive surgery was pending (patient was opted for a mastectomy), adjuvant chemotherapy was discussed and opted against, and genetics testing was to be completed.  Of note, the patient was initially evaluated in the multidisciplinary breast clinic on 08/14/2019 for her history of right breast cancer in 2012. She underwent right mastectomy and chemotherapy in [place] for what appeared to be a multifocal T1-T2 N1 (mic)  invasive ductal carcinoma, ER+/PR+, Her-2 negative. She completed five years of Anastrozole in 2017.  She also has a history of indeterminate pulmonary nodules that are being followed by PET and CT scans. Her most recent PET scan on 09/20/2019 showed multifocal bilateral pulmonary nodules with mild, nonspecific FDG uptake. There was also some mild, nonspecific FDG uptake associated with a low right paratracheal lymph node. Her most recent chest CT on 01/03/2020 showed a decrease in size or resolution of most of the bilateral pulmonary nodular opacities, although two sub-cm nodules in the right upper and middles lobes were new. There was no evidence of lymphadenopathy or pleural effusion.  Repeat chest CT scan in 84-monthinterval was recommended.  Patient is now seen in radiation oncology prior to her surgery to discuss treatment options as relates to high-grade triple negative breast cancer in a 86year old woman.  PREVIOUS RADIATION THERAPY: No  PAST MEDICAL HISTORY:  Past Medical History:  Diagnosis Date  . Afib ([facility]   . Arthritis of ankle   . CAD (coronary artery disease)   . Cancer ([facility] 2012   breast cancer-right  . Compression fracture of T12 vertebra ([facility])   . Diverticulosis   . Edema   . Elevated uric acid in blood   . GI bleed 12/2015  . Hyperlipidemia   . Hypertension   . Long-term (current) use of anticoagulants   . Moderate mitral regurgitation   . Pulmonary HTN ([facility]    Echo 1/19: EF 60-65, no RWMA, Ao sclerosis, trivial AI, mild MR, mod BAE, mod TR, PASP 49  . Stroke ([facility] 01/25/2016  . TIA (transient ischemic attack) 09/2011  . Urinary incontinence   . Urinary retention     PAST SURGICAL  HISTORY: Past Surgical History:  Procedure Laterality Date  . APPENDECTOMY    . BREAST SURGERY  2012   Rt.mastectomy--[place], [place]  2013  . CATARACT EXTRACTION, BILATERAL    . DILATION AND CURETTAGE OF UTERUS     in her early 74's for DUB  .  ESOPHAGOGASTRODUODENOSCOPY Left 01/08/2016   Procedure: ESOPHAGOGASTRODUODENOSCOPY (EGD);  Surgeon: Arta Silence, MD;  Location: [facility];  Service: Endoscopy;  Laterality: Left;  . GALLBLADDER SURGERY    . MASTECTOMY Right   . RADIOLOGY WITH ANESTHESIA N/A 01/25/2016   Procedure: RADIOLOGY WITH ANESTHESIA;  Surgeon: Luanne Bras, MD;  Location: [facility];  Service: Radiology;  Laterality: N/A;  . REPLACEMENT TOTAL KNEE BILATERAL    . TONSILLECTOMY AND ADENOIDECTOMY     as a child    FAMILY HISTORY:  Family History  Problem Relation Age of Onset  . Asthma Mother   . CVA Father   . Atrial fibrillation Sister        HAS PACER  . Pneumonia Brother   . Cancer Daughter        COLON CANCER  . Cancer Maternal Grandmother        breast cancer    SOCIAL HISTORY:  Social History   Tobacco Use  . Smoking status: Former Smoker    Packs/day: 0.25    Years: 2.00    Pack years: 0.50    Types: Cigarettes    Quit date: 1950    Years since quitting: 71.3  . Smokeless tobacco: Never Used  Substance Use Topics  . Alcohol use: No    Alcohol/week: 0.0 standard drinks  . Drug use: No    ALLERGIES:  Allergies  Allergen Reactions  . Valium [Diazepam] Other (See Comments)    HYPERACTIVITY   . Amiodarone Hcl [Amiodarone] Other (See Comments)    AFIB  . Compazine [Prochlorperazine] Other (See Comments)    HYPERACTIVITY   . Other Other (See Comments)    "Kidney dye"--patient states this gave her severe shakes/chills  . Thorazine [Chlorpromazine] Other (See Comments)    HYPERACTIVITY   . Zometa [Zoledronic Acid] Other (See Comments)    PROBLEMS WITH EYES  TIA  . Ciprofloxacin Swelling    "swelling" in Rt.elbow    MEDICATIONS:  Current Outpatient Medications  Medication Sig Dispense Refill  . acetaminophen (TYLENOL) 500 MG tablet Take 500-1,000 mg by mouth every 12 (twelve) hours as needed (pain).     Marland Kitchen amoxicillin (AMOXIL) 500 MG capsule Take 2,000 mg by mouth as needed.  Only takes as preventative prior to dentist    . Ascorbic Acid (VITAMIN C) 1000 MG tablet Take 1,000 mg by mouth daily.    Marland Kitchen atorvastatin (LIPITOR) 10 MG tablet TAKE 1 TABLET DAILY 90 tablet 3  . Calcium Citrate 250 MG TABS Take 5 tablets by mouth daily.    Marland Kitchen diltiazem (CARDIZEM) 60 MG tablet TAKE 1 TABLET THREE TIMES A DAY 270 tablet 3  . ELIQUIS 5 MG TABS tablet TAKE 1 TABLET TWICE A DAY 180 tablet 1  . furosemide (LASIX) 20 MG tablet Take 1 tablet (20 mg total) by mouth daily. 90 tablet 3  . metoprolol tartrate (LOPRESSOR) 100 MG tablet TAKE 1 TABLET TWICE A DAY 180 tablet 3  . Multiple Vitamins-Minerals (MULTIVITAMIN ADULT PO) Take 1 tablet by mouth daily.     . nitrofurantoin (MACRODANTIN) 100 MG capsule Take 1 capsule by mouth daily.    Marland Kitchen omeprazole (PRILOSEC) 10 MG capsule Take  10 mg by mouth every other day.     No current facility-administered medications for this encounter.    REVIEW OF SYSTEMS:  A 10+ POINT REVIEW OF SYSTEMS WAS OBTAINED including neurology, dermatology, psychiatry, cardiac, respiratory, lymph, extremities, GI, GU, musculoskeletal, constitutional, reproductive, HEENT.  She denies any swelling in her left arm or hand.  She denies any pain in the left breast area nipple discharge or bleeding   PHYSICAL EXAM:  height is 5' (1.524 m) and weight is 150 lb (68 kg). Her oral temperature is 98 F (36.7 C). Her blood pressure is 157/73 (abnormal) and her pulse is 79. Her respiration is 20 and oxygen saturation is 96%.   General: Alert and oriented, in no acute distress HEENT: Head is normocephalic. Extraocular movements are intact. Neck: Neck is supple, no palpable cervical or supraclavicular lymphadenopathy. Heart: Regular in rate and rhythm with no murmurs, rubs, or gallops. Chest: Clear to auscultation bilaterally, with no rhonchi, wheezes, or rales. Abdomen: Soft, nontender, nondistended, with no rigidity or guarding. Extremities: No cyanosis or edema. Lymphatics: see  Neck Exam Skin: No concerning lesions. Musculoskeletal: symmetric strength and muscle tone throughout.  She has good range of movement in her left arm and shoulder and would be able to obtain treatment position for radiation therapy Neurologic: Cranial nerves II through XII are grossly intact. No obvious focalities. Speech is fluent. Coordination is intact. Psychiatric: Judgment and insight are intact. Affect is appropriate. Right chest wall: Mastectomy scar in place without palpable or visible signs of recurrence, no signs of radiation therapy or radiation tattoos along the right chest wall area Left breast: Patient has some bruising in the upper inner quadrant but no palpable mass.  No nipple discharge or bleeding  ECOG = 1  0 - Asymptomatic (Fully active, able to carry on all predisease activities without restriction)  1 - Symptomatic but completely ambulatory (Restricted in physically strenuous activity but ambulatory and able to carry out work of a light or sedentary nature. For example, light housework, office work)  2 - Symptomatic, <50% in bed during the day (Ambulatory and capable of all self care but unable to carry out any work activities. Up and about more than 50% of waking hours)  3 - Symptomatic, >50% in bed, but not bedbound (Capable of only limited self-care, confined to bed or chair 50% or more of waking hours)  4 - Bedbound (Completely disabled. Cannot carry on any self-care. Totally confined to bed or chair)  5 - Death   Eustace Pen MM, Creech RH, Tormey DC, et al. [phone]). "Toxicity and response criteria of the [facility]". [place] Oncol. 5 (6): 649-55  LABORATORY DATA:  Lab Results  Component Value Date   WBC 8.8 12/20/2019   HGB 12.4 12/20/2019   HCT 39.0 12/20/2019   MCV 92.6 12/20/2019   PLT 234 12/20/2019   NEUTROABS 6.6 12/20/2019   Lab Results  Component Value Date   NA 141 12/20/2019   K 4.2 12/20/2019   CL 105 12/20/2019   CO2  28 12/20/2019   GLUCOSE 92 12/20/2019   CREATININE 1.06 (H) 12/20/2019   CALCIUM 9.9 12/20/2019      RADIOGRAPHY: CT Chest Wo Contrast  Result Date: 01/04/2020 CLINICAL DATA:  Follow-up indeterminate pulmonary nodules. Personal history of breast carcinoma. EXAM: CT CHEST WITHOUT CONTRAST TECHNIQUE: Multidetector CT imaging of the chest was performed following the standard protocol without IV contrast. COMPARISON:  09/09/2019 FINDINGS: Cardiovascular: No acute findings. Stable mild  cardiomegaly. Aortic and coronary artery atherosclerosis incidentally noted. Mediastinum/Nodes: No masses or pathologically enlarged lymph nodes identified on this unenhanced exam. Prior right mastectomy. No evidence of axillary lymphadenopathy. Lungs/Pleura: Multiple previously seen ill-defined solid and ground-glass nodular opacities have decreased in size or resolved since previous study. 11 mm solid nodular opacity in the posterior right lower lobe abutting the diaphragm on image 119/8 remains stable. A circumscribed solid nodule in the right middle lobe measuring 7 mm on image 74/8 is also unchanged. A 6 mm ill-defined nodule in the posterior right upper lobe on image 22/8, and a 5 mm nodule in the lateral right middle lobe on image 61/8, are new since previous study. These findings are most suggestive of a waxing and waning inflammatory or infectious etiology such as MAI. No evidence of pulmonary consolidation or pleural effusion. Upper Abdomen:  Unremarkable. Musculoskeletal:  No suspicious bone lesions. IMPRESSION: 1. Majority of bilateral pulmonary nodular opacities have decreased in size or resolved since previous study, although 2 sub-cm nodules in the right upper and middle lobes are new. These findings are most suggestive of a waxing and waning atypical infectious or inflammatory etiology, possibly MAI. Recommend continued follow-up by chest CT without contrast in 6 months. 2. No evidence of lymphadenopathy or pleural  effusion. Aortic Atherosclerosis (ICD10-I70.0). Electronically Signed   By: Marlaine Hind M.D.   On: 01/04/2020 12:01   MM CLIP PLACEMENT LEFT  Result Date: 12/10/2019 CLINICAL DATA:  Status post ultrasound-guided core biopsy of mass in the 11 o'clock location of the LEFT breast. EXAM: DIAGNOSTIC LEFT MAMMOGRAM POST ULTRASOUND BIOPSY COMPARISON:  Previous exam(s). FINDINGS: Mammographic images were obtained following ultrasound guided biopsy of mass in the 11 o'clock location of the LEFT breast and placement of a ribbon shaped clip. The biopsy marking clip is in expected position at the site of biopsy. IMPRESSION: Appropriate positioning of the ribbon shaped biopsy marking clip at the site of biopsy in the UPPER INNER QUADRANT LEFT breast. Final Assessment: Post Procedure Mammograms for Marker Placement Electronically Signed   By: Nolon Nations M.D.   On: 12/10/2019 16:03   [place] LT BREAST BX W LOC DEV 1ST LESION IMG BX SPEC US GUIDE  Addendum Date: 12/12/2019   ADDENDUM REPORT: 12/11/2019 15:51 ADDENDUM: Pathology revealed GRADE III INVASIVE DUCTAL CARCINOMA. DUCTAL CARCINOMA IN SITU of the LEFT breast, 11 o'clock, upper inner, ribbon clip. This was found to be concordant by Dr. Nolon Nations. Pathology results were discussed with the patient by telephone. The patient reported doing well after the biopsy with tenderness at the site. Post biopsy instructions and care were reviewed and questions were answered. The patient was encouraged to call [facility] for any additional concerns. Given the interval increased number of cysts throughout the LEFT breast since 2019, and lack of exogenous source of estrogen, recommend correlation with estradiol levels to exclude possible estrogen secreting tumors. Per patient request, appointment has been arranged with Dr. Lurline Del of [facility] on December 20, 2019. Pathology results reported by Stacie Acres RN on  12/11/2019. The findings are discussed with Dr. Jana Hakim. Electronically Signed   By: Nolon Nations M.D.   On: 12/11/2019 15:51   Result Date: 12/12/2019 CLINICAL DATA:  Patient presents for ultrasound-guided core biopsy of mass in the 11 o'clock location of the LEFT breast. EXAM: ULTRASOUND GUIDED LEFT BREAST CORE NEEDLE BIOPSY COMPARISON:  Previous exam(s). PROCEDURE: I met with the patient and we discussed the procedure of  ultrasound-guided biopsy, including benefits and alternatives. We discussed the high likelihood of a successful procedure. We discussed the risks of the procedure, including infection, bleeding, tissue injury, clip migration, and inadequate sampling. Informed written consent was given. The usual time-out protocol was performed immediately prior to the procedure. Lesion quadrant: UPPER INNER QUADRANT LEFT breast Using sterile technique and 1% Lidocaine as local anesthetic, under direct ultrasound visualization, a 14 gauge spring-loaded device was used to perform biopsy of mass in the 11 o'clock location of the LEFT breast using a LATERAL to MEDIAL approach. At the conclusion of the procedure ribbon shaped tissue marker clip was deployed into the biopsy cavity. Follow up 2 view mammogram was performed and dictated separately. IMPRESSION: Ultrasound guided biopsy of LEFT breast mass. No apparent complications. Electronically Signed: By: Nolon Nations M.D. On: 12/10/2019 16:01      IMPRESSION: Clinical stage IB (T1b, N0) Left Breast UIQ, Invasive Ductal Carcinoma with DCIS, ER- / PR- / Her2-, Grade 3  Patient has been found to have a high-grade triple negative, clinical stage Ib breast cancer.  Options for management of the the lumpectomy/ radiation therapy or mastectomy.  Given the findings of triple negative breast cancer, high-grade,  I would not recommend lumpectomy alone in this situation. During the process of her physical examination today she would appear to be able to obtain a   good set up position for radiation therapy.  She has had no previous left shoulder injury or rotator cuff issues.  she would likely be a candidate for hypofractionated accelerated radiation therapy over approximately 4 weeks assuming her lymph nodes are negative.  Patient did meet with medical oncology (Dr. Jana Hakim)  who felt that mastectomy would be patient's best option for management given her prior right mastectomy and symmetry issues.  The patient would also likely avoid radiation therapy if she were to have a mastectomy.  Patient and husband are somewhat hesitant about considering a mastectomy given her chronic anticoagulation and fear of possible stroke if she is taken off this medication.  I would anticipate that recovery time, risk for bleeding and surgical time would be greater with a mastectomy versus lumpectomy but Dr. Donne Hazel can clarify this issue with the patient and family.  Today, I talked to the patient and family about the findings and work-up thus far.  We discussed the natural history of breast cancer and general treatment, highlighting the role of radiotherapy in the management.  We discussed the available radiation techniques, and focused on the details of logistics and delivery.  We reviewed the anticipated acute and late sequelae associated with radiation in this setting.  The patient was encouraged to ask questions that I answered to the best of my ability.    PLAN: I have encouraged good and family to call back if they have any additional questions concerning her management.  She will be meeting with Dr. Donne Hazel in the near future for planning of her upcoming surgery, that being a lumpectomy or mastectomy.    ------------------------------------------------  Blair Promise, PhD, MD  This document serves as a record of services personally performed by Gery Pray, MD. It was created on his behalf by Clerance Lav, a trained medical scribe. The creation of this record is  based on the scribe's personal observations and the provider's statements to them. This document has been checked and approved by the attending provider.

## 2020-01-07 NOTE — Progress Notes (Signed)
Location of Breast Cancer: Left Breast Cancer  Histology per Pathology Report: Left Breast   Receptor Status: ER(-0%), PR (-0%), Her2-neu (-), Ki-67(90%)  Did patient present with symptoms (if so, please note symptoms) or was this found on screening mammography?:   Past/Anticipated interventions by surgeon, if any:  Past/Anticipated interventions by medical oncology, if any: Chemotherapy  Dr. Jana Hakim 12/20/2019 Adrienne [name] has a contralateral breast cancer.  This is small but nasty, with a very fast rate of growth and very aggressive morphology.  -Given her age, despite her excellent functional status, I would prefer to hold systemic treatment particularly since the tumor is less than a centimeter.  We discussed this at length today. -We then discussed the surgical options which are basically lumpectomy plus radiation versus mastectomy.  Because she already had a mastectomy on the contralateral side (and therefore a second mastectomy would improve symmetry) and because she would prefer to avoid radiation, her choice would be mastectomy.  We are placing surgical referral so this can be accomplished as soon as possible for her. -We also discussed the lung nodules that were noted in December.  I have reviewed the CT scan and the PET scan.  Given the low level of uptake on the PET scan I do not think that these are malignant and they require only follow-up. LEFT BREAST CANCER (3) status post left breast upper inner quadrant biopsy 12/10/2019 for a clinical T1b N0, stage IB invasive ductal carcinoma grade 3, triple negative, with an MIB-1 of 90%  (4) definitive surgery pending: Patient opts for mastectomy  (5) adjuvant chemotherapy discussed, opted against  (6) meets criteria for genetics testing   Lymphedema issues, if any:   Pain issues, if any:  No  SAFETY ISSUES:  Prior radiation? No  Pacemaker/ICD? No  Possible current pregnancy? Postmenopausal  Is the patient on methotrexate?  No  Current Complaints / other details:    - RIGHT BREAST CANCER (1) status post right mastectomy and sentinel lymph node sampling September 2012 for what appears to have been a multifocal T1-T2 N1 (mic) invasive ductal carcinoma, estrogen and progesterone receptor positive, HER-2 not amplified. -Completed 5 years of anastrozole 2017   [name], RN 01/07/2020,7:54 AM

## 2020-01-08 DIAGNOSIS — N3020 Other chronic cystitis without hematuria: Secondary | ICD-10-CM | POA: Diagnosis not present

## 2020-01-09 ENCOUNTER — Ambulatory Visit: Payer: Medicare Other | Admitting: Radiation Oncology

## 2020-01-09 ENCOUNTER — Other Ambulatory Visit: Payer: Self-pay | Admitting: General Surgery

## 2020-01-09 ENCOUNTER — Ambulatory Visit: Payer: Medicare Other

## 2020-01-09 DIAGNOSIS — C50212 Malignant neoplasm of upper-inner quadrant of left female breast: Secondary | ICD-10-CM

## 2020-01-09 DIAGNOSIS — Z171 Estrogen receptor negative status [ER-]: Secondary | ICD-10-CM

## 2020-01-10 ENCOUNTER — Other Ambulatory Visit: Payer: Medicare Other

## 2020-01-11 HISTORY — PX: BREAST LUMPECTOMY: SHX2

## 2020-01-13 NOTE — Progress Notes (Signed)
[facility] DRUG STORE [place], [place] - [address] SEC OF [place] [address] [place] [address] Phone: [phone] Fax: [phone]  [facility] [place], [place] [address] [place] [address] Phone: [phone] Fax: [phone]      Your procedure is scheduled on Wednesday May 12  Report to [facility] Main Entrance "A" at 0830 A.M., and check in at the Admitting office.  Call this number if you have problems the morning of surgery:  [phone]  Call [phone] if you have any questions prior to your surgery date Monday-Friday 8am-4pm    Remember:  Do not eat  after midnight the night before your surgery  You may drink clear liquids until 0730 am the morning of your surgery.   Clear liquids allowed are: Water, Non-Citrus Juices (without pulp), Carbonated Beverages, Clear Tea, Black Coffee Only, and Gatorade  Please complete your PRE-SURGERY ENSURE that was provided to you by 0730 am  the morning of surgery.  Please, if able, drink it in one setting. DO NOT SIP.     Take these medicines the morning of surgery with A SIP OF WATER  acetaminophen (TYLENOL) if needed atorvastatin (LIPITOR diltiazem (CARDIZEM) metoprolol tartrate (LOPRESSOR) omeprazole (PRILOSEC)   As of today, STOP taking any Aspirin (unless otherwise instructed by your surgeon) and Aspirin containing products, Aleve, Naproxen, Ibuprofen, Motrin, Advil, Goody's, BC's, all herbal medications, fish oil, and all vitamins.                      Do not wear jewelry, make up, or nail polish            Do not wear lotions, powders, perfumes, or deodorant.            Do not shave 48 hours prior to surgery.              Do not bring valuables to the hospital.            [facility] is not responsible for any belongings or valuables.  Do NOT Smoke (Tobacco/Vapping) or drink Alcohol 24 hours prior to your  procedure If you use a CPAP at night, you may bring all equipment for your overnight stay.   Contacts, glasses, dentures or bridgework may not be worn into surgery.      For patients admitted to the hospital, discharge time will be determined by your treatment team.   Patients discharged the day of surgery will not be allowed to drive home, and someone needs to stay with them for 24 hours.    Special instructions:   [place]- Preparing For Surgery  Before surgery, you can play an important role. Because skin is not sterile, your skin needs to be as free of germs as possible. You can reduce the number of germs on your skin by washing with CHG (chlorahexidine gluconate) Soap before surgery.  CHG is an antiseptic cleaner which kills germs and bonds with the skin to continue killing germs even after washing.    Oral Hygiene is also important to reduce your risk of infection.  Remember - BRUSH YOUR TEETH THE MORNING OF SURGERY WITH YOUR REGULAR TOOTHPASTE  Please do not use if you have an allergy to CHG or antibacterial soaps. If your skin becomes reddened/irritated stop using the CHG.  Do not shave (including legs and underarms) for  at least 48 hours prior to first CHG shower. It is OK to shave your face.  Please follow these instructions carefully.   1. Shower the NIGHT BEFORE SURGERY and the MORNING OF SURGERY with CHG Soap.   2. If you chose to wash your hair, wash your hair first as usual with your normal shampoo.  3. After you shampoo, rinse your hair and body thoroughly to remove the shampoo.  4. Use CHG as you would any other liquid soap. You can apply CHG directly to the skin and wash gently with a scrungie or a clean washcloth.   5. Apply the CHG Soap to your body ONLY FROM THE NECK DOWN.  Do not use on open wounds or open sores. Avoid contact with your eyes, ears, mouth and genitals (private parts). Wash Face and genitals (private parts)  with your normal soap.   6. Wash  thoroughly, paying special attention to the area where your surgery will be performed.  7. Thoroughly rinse your body with warm water from the neck down.  8. DO NOT shower/wash with your normal soap after using and rinsing off the CHG Soap.  9. Pat yourself dry with a CLEAN TOWEL.  10. Wear CLEAN PAJAMAS to bed the night before surgery, wear comfortable clothes the morning of surgery  11. Place CLEAN SHEETS on your bed the night of your first shower and DO NOT SLEEP WITH PETS.   Day of Surgery:   Do not apply any deodorants/lotions.  Please wear clean clothes to the hospital/surgery center.   Remember to brush your teeth WITH YOUR REGULAR TOOTHPASTE.   Please read over the following fact sheets that you were given.

## 2020-01-14 ENCOUNTER — Other Ambulatory Visit: Payer: Self-pay

## 2020-01-14 ENCOUNTER — Encounter (HOSPITAL_COMMUNITY)
Admission: RE | Admit: 2020-01-14 | Discharge: 2020-01-14 | Disposition: A | Discharge status: Home / Self Care | Payer: Medicare Other | Source: Ambulatory Visit | Attending: General Surgery | Admitting: General Surgery

## 2020-01-14 ENCOUNTER — Encounter: Payer: Self-pay | Admitting: *Deleted

## 2020-01-14 ENCOUNTER — Encounter (HOSPITAL_COMMUNITY): Payer: Self-pay

## 2020-01-14 DIAGNOSIS — I10 Essential (primary) hypertension: Secondary | ICD-10-CM | POA: Diagnosis not present

## 2020-01-14 DIAGNOSIS — Z7901 Long term (current) use of anticoagulants: Secondary | ICD-10-CM | POA: Diagnosis not present

## 2020-01-14 DIAGNOSIS — Z01812 Encounter for preprocedural laboratory examination: Secondary | ICD-10-CM | POA: Diagnosis not present

## 2020-01-14 DIAGNOSIS — I2510 Atherosclerotic heart disease of native coronary artery without angina pectoris: Secondary | ICD-10-CM | POA: Insufficient documentation

## 2020-01-14 DIAGNOSIS — I4891 Unspecified atrial fibrillation: Secondary | ICD-10-CM | POA: Diagnosis not present

## 2020-01-14 DIAGNOSIS — E785 Hyperlipidemia, unspecified: Secondary | ICD-10-CM | POA: Insufficient documentation

## 2020-01-14 HISTORY — DX: Gastro-esophageal reflux disease without esophagitis: K21.9

## 2020-01-14 LAB — CBC
HCT: 41.5 % (ref 36.0–46.0)
Hemoglobin: 13.1 g/dL (ref 12.0–15.0)
MCH: 30 pg (ref 26.0–34.0)
MCHC: 31.6 g/dL (ref 30.0–36.0)
MCV: 95.2 fL (ref 80.0–100.0)
Platelets: 245 10*3/uL (ref 150–400)
RBC: 4.36 MIL/uL (ref 3.87–5.11)
RDW: 13.2 % (ref 11.5–15.5)
WBC: 12.1 10*3/uL — ABNORMAL HIGH (ref 4.0–10.5)
nRBC: 0 % (ref 0.0–0.2)

## 2020-01-14 LAB — BASIC METABOLIC PANEL
Anion gap: 10 (ref 5–15)
BUN: 18 mg/dL (ref 8–23)
CO2: 28 mmol/L (ref 22–32)
Calcium: 9.9 mg/dL (ref 8.9–10.3)
Chloride: 100 mmol/L (ref 98–111)
Creatinine, Ser: 1.15 mg/dL — ABNORMAL HIGH (ref 0.44–1.00)
GFR calc Af Amer: 49 mL/min — ABNORMAL LOW (ref >60)
GFR calc non Af Amer: 42 mL/min — ABNORMAL LOW (ref >60)
Glucose, Bld: 102 mg/dL — ABNORMAL HIGH (ref 70–99)
Potassium: 4.1 mmol/L (ref 3.5–5.1)
Sodium: 138 mmol/L (ref 135–145)

## 2020-01-14 NOTE — Progress Notes (Signed)
[facility] DRUG STORE [place], [place] - [address] AT [address] OF [place] [address] [place] [address] Phone: [phone] Fax: [phone]  [facility] [place], [place] [address] [place] [address] Phone: [phone] Fax: [phone]      Your procedure is scheduled on Wednesday May 12  Report to [facility] Main Entrance "A" at 08:30 A.M., and check in at the Admitting office.  Call this number if you have problems the morning of surgery:  [phone]  Call [phone] if you have any questions prior to your surgery date Monday-Friday 8am-4pm   Remember:  Do not eat  after midnight the night before your surgery  You may drink clear liquids until 0730 am the morning of your surgery.   Clear liquids allowed are: Water, Non-Citrus Juices (without pulp), Carbonated Beverages, Clear Tea, Black Coffee Only, and Gatorade  Please complete your PRE-SURGERY ENSURE that was provided to you by 0730 am  the morning of surgery.  Please, if able, drink it in one setting. DO NOT SIP.    Take these medicines the morning of surgery with A SIP OF WATER  acetaminophen (TYLENOL) if needed atorvastatin (LIPITOR diltiazem (CARDIZEM) metoprolol tartrate (LOPRESSOR) omeprazole (PRILOSEC)   Follow your Doctor's instructions when to STOP you ELIQUIS.  As of today, STOP taking any Aspirin (unless otherwise instructed by your surgeon) and Aspirin containing products, Aleve, Naproxen, Ibuprofen, Motrin, Advil, Goody's, BC's, all herbal medications, fish oil, and all vitamins.                     Do not wear jewelry, make up, or nail polish            Do not wear lotions, powders, perfumes, or deodorant.            Do not shave 48 hours prior to surgery.              Do not bring valuables to the hospital.            [facility] is not responsible for any belongings or valuables.  Do NOT Smoke  (Tobacco/Vapping) or drink Alcohol 24 hours prior to your procedure If you use a CPAP at night, you may bring all equipment for your overnight stay.   Contacts, glasses, dentures or bridgework may not be worn into surgery.      For patients admitted to the hospital, discharge time will be determined by your treatment team.   Patients discharged the day of surgery will not be allowed to drive home, and someone needs to stay with them for 24 hours.   Special instructions:   [place]- Preparing For Surgery  Before surgery, you can play an important role. Because skin is not sterile, your skin needs to be as free of germs as possible. You can reduce the number of germs on your skin by washing with CHG (chlorahexidine gluconate) Soap before surgery.  CHG is an antiseptic cleaner which kills germs and bonds with the skin to continue killing germs even after washing.    Oral Hygiene is also important to reduce your risk of infection.  Remember - BRUSH YOUR TEETH THE MORNING OF SURGERY WITH YOUR REGULAR TOOTHPASTE  Please do not use if you have an allergy to CHG or antibacterial soaps. If your skin becomes reddened/irritated stop using the CHG.  Do not  shave (including legs and underarms) for at least 48 hours prior to first CHG shower. It is OK to shave your face.  Please follow these instructions carefully.   1. Shower the NIGHT BEFORE SURGERY and the MORNING OF SURGERY with CHG Soap.   2. If you chose to wash your hair, wash your hair first as usual with your normal shampoo.  3. After you shampoo, rinse your hair and body thoroughly to remove the shampoo.  4. Use CHG as you would any other liquid soap. You can apply CHG directly to the skin and wash gently with a scrungie or a clean washcloth.   5. Apply the CHG Soap to your body ONLY FROM THE NECK DOWN.  Do not use on open wounds or open sores. Avoid contact with your eyes, ears, mouth and genitals (private parts). Wash Face and genitals  (private parts)  with your normal soap.   6. Wash thoroughly, paying special attention to the area where your surgery will be performed.  7. Thoroughly rinse your body with warm water from the neck down.  8. DO NOT shower/wash with your normal soap after using and rinsing off the CHG Soap.  9. Pat yourself dry with a CLEAN TOWEL.  10. Wear CLEAN PAJAMAS to bed the night before surgery, wear comfortable clothes the morning of surgery  11. Place CLEAN SHEETS on your bed the night of your first shower and DO NOT SLEEP WITH PETS.   Day of Surgery:   Do not apply any deodorants/lotions.  Please wear clean clothes to the hospital/surgery center.   Remember to brush your teeth WITH YOUR REGULAR TOOTHPASTE.   Please read over the following fact sheets that you were given.

## 2020-01-14 NOTE — Progress Notes (Signed)
PCP: Dr. Sabra Heck Cardiologist: Dr. Marlou Porch Pulmonologist: Dr. Valeta Harms  EKG: 05/06/19 CT chest: 01/04/2020 ECHO: 05/09/2019 Stress Test: denies Cardiac Cath: denies  Eliquis: Reports she was instructed to stop the day before surgery  Going for Covid testing 01/18/20--aware to self quarentine ERAS: Pre-ensure drink provided  Patient denies fever and chest pain at PAT appointment. Endorses cough/SOB with activity, but Dr. Valeta Harms is aware and recently had CT scan "which was encouraging improvement of nodules" since the last scan. No respiratory distress/coughing noted during appt.   Patient verbalized understanding of instructions provided today at the PAT appointment.  Patient asked to review instructions at home and day of surgery.   Of note: Pt would like care team to know she wears a Pessary all the time to assist in voiding. Questioned today if she could wear it during surgery.  Called Ebony Hail PA-C with anesthesia--ok to wear from an anesthesia standpoint. IF Pessary needs to be removed, pt reports she would require a catheter to assist with voiding. Instructed patient to remind care team of the Pessary presence the day of surgery.

## 2020-01-15 ENCOUNTER — Other Ambulatory Visit: Payer: Medicare Other

## 2020-01-15 DIAGNOSIS — B9620 Unspecified Escherichia coli [E. coli] as the cause of diseases classified elsewhere: Secondary | ICD-10-CM | POA: Diagnosis not present

## 2020-01-15 DIAGNOSIS — N390 Urinary tract infection, site not specified: Secondary | ICD-10-CM | POA: Diagnosis not present

## 2020-01-15 NOTE — Progress Notes (Signed)
Anesthesia Chart Review:  Follows with cardiology for hx of atrial fibrillation on Eliquis, hyperlipidemia, hypertension, mod MR, CAD (Nonobstructive CAD, she had normal cardiac catheterization in 2013 in [place] per cardiology notes). Patient had a prior diagnosis of pulmonary hypertension however follow-up echocardiogram revealed normal RV function. Last seen by Dr. Marlou Porch 08/21/19, at that time afib noted to be under good rate control. Discussed chronic cough which did not improve with increased prilosec. Dr Marlou Porch did not feel it was related to CHF and advised to f/u with PCP. Pt also discussed chronic dry cough with pulmonology and was advised on strategies to mitigate GERD. 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 She was very hesitant to stop for this procedure. Dr. Marlou Porch advised pt to hold Eliquis for 2 doses prior to surgery. Per telephone encounter 10/18/19, "It is fine with her to hold her Eliquis 8 AM and 8 PM the day prior to surgery.  Of course she would hold it as well the morning of surgery.  She may resume it at 8 PM post surgery if okay with surgeon"  She has a history of indeterminate pulmonary nodules that are being followed by PET and CT scans. Her most recent PET scan on 09/20/2019 showed multifocal bilateral pulmonary nodules with mild, nonspecific FDG uptake. There was also some mild, nonspecific FDG uptake associated with a low right paratracheal lymph node. Her most recent chest CT on 01/03/2020 showed a decrease in size or resolution of most of the bilateral pulmonary nodular opacities, although two sub-cm nodules in the right upper and middles lobes were new. There was no evidence of lymphadenopathy or pleural effusion.  Repeat chest CT scan in 74-month interval was recommended.  History of R breast cancer status post mastectomy and chemotherapy 2012.   Preop labs reviewed, GFR mildly reduced at 42, creatinine 1.15. Labs otherwise unremrkable.   EKG  05/06/19: Afib. Rate 62.   Chest CT 01/03/20: IMPRESSION: 1. Majority of bilateral pulmonary nodular opacities have decreased in size or resolved since previous study, although 2 sub-cm nodules in the right upper and middle lobes are new. These findings are most suggestive of a waxing and waning atypical infectious or inflammatory etiology, possibly MAI. Recommend continued follow-up by chest CT without contrast in 6 months. 2. No evidence of lymphadenopathy or pleural effusion.  TTE 05/09/19: 1. The left ventricle has normal systolic function with an ejection  fraction of 60-65%. The cavity size was normal. Left ventricular diastolic  function could not be evaluated secondary to atrial fibrillation.  2. The right ventricle has normal systolic function. The cavity was  normal. There is no increase in right ventricular wall thickness. Right  ventricular systolic pressure is normal.  3. Left atrial size was severely dilated.  4. There is mild mitral annular calcification present.  5. The aortic valve is tricuspid. Mild sclerosis of the aortic valve.  Aortic valve regurgitation is trivial by color flow Doppler.  6. The aorta is normal unless otherwise noted.  7. The inferior vena cava was dilated in size with >50% respiratory  variability.   Carotid US 12/04/17: Final Interpretation:  Right Carotid: Velocities in the right ICA are consistent with a 1-39% stenosis. Non-hemodynamically significant plaque <50% noted in the CCA. Left Carotid: Velocities in the left ICA are consistent with a 1-39% stenosis.  Vertebrals: Bilateral vertebral arteries demonstrate antegrade flow.  Subclavians: Normal flow hemodynamics were seen in bilateral subclavian arteries.  Karoline Caldwell, PA-C [facility] Short Stay Center/Anesthesiology Phone [phone] 01/16/2020 11:28 AM

## 2020-01-16 ENCOUNTER — Encounter: Payer: Self-pay | Admitting: *Deleted

## 2020-01-16 NOTE — Anesthesia Preprocedure Evaluation (Addendum)
Anesthesia Evaluation  Patient identified by MRN, date of birth, ID band Patient awake    Reviewed: Allergy & Precautions, NPO status , Patient's Chart, lab work & pertinent test results, reviewed documented beta blocker date and time   History of Anesthesia Complications Negative for: history of anesthetic complications  Airway Mallampati: II  TM Distance: >3 FB Neck ROM: Full    Dental  (+) Dental Advisory Given   Pulmonary former smoker,    Pulmonary exam normal        Cardiovascular hypertension, Pt. on home beta blockers and Pt. on medications + CAD  Normal cardiovascular exam+ dysrhythmias Atrial Fibrillation    '20 TTE - EF 60-65%. LA was severely dilated. Mild sclerosis of the aortic valve. Trivial AI.   '19 TTE - PASP: 49 mmHg    Neuro/Psych TIACVA, No Residual Symptoms negative psych ROS   GI/Hepatic Neg liver ROS, GERD  Medicated and Controlled,  Endo/Other  negative endocrine ROS  Renal/GU negative Renal ROS  Female GU complaint     Musculoskeletal negative musculoskeletal ROS (+)   Abdominal   Peds  Hematology  Last eliquis 2 days ago    Anesthesia Other Findings Covid neg 5/8   Reproductive/Obstetrics  Breast cancer                            Anesthesia Physical Anesthesia Plan  ASA: III  Anesthesia Plan: General   Post-op Pain Management:  Regional for Post-op pain   Induction: Intravenous  PONV Risk Score and Plan: 3 and Treatment may vary due to age or medical condition and Ondansetron  Airway Management Planned: LMA  Additional Equipment: None  Intra-op Plan:   Post-operative Plan: Extubation in OR  Informed Consent: I have reviewed the patients History and Physical, chart, labs and discussed the procedure including the risks, benefits and alternatives for the proposed anesthesia with the patient or authorized representative who has indicated his/her  understanding and acceptance.     Dental advisory given  Plan Discussed with: CRNA and Anesthesiologist  Anesthesia Plan Comments: ( )      Anesthesia Quick Evaluation

## 2020-01-17 ENCOUNTER — Inpatient Hospital Stay: Payer: Medicare Other | Attending: Oncology | Admitting: Oncology

## 2020-01-17 ENCOUNTER — Ambulatory Visit (HOSPITAL_BASED_OUTPATIENT_CLINIC_OR_DEPARTMENT_OTHER): Payer: Medicare Other | Admitting: Oncology

## 2020-01-17 ENCOUNTER — Inpatient Hospital Stay: Payer: Medicare Other

## 2020-01-17 ENCOUNTER — Other Ambulatory Visit: Payer: Self-pay

## 2020-01-17 ENCOUNTER — Encounter: Payer: Self-pay | Admitting: *Deleted

## 2020-01-17 VITALS — BP 162/58 | HR 59 | Temp 98.7°F | Resp 17 | Ht 60.0 in | Wt 147.6 lb

## 2020-01-17 DIAGNOSIS — M8580 Other specified disorders of bone density and structure, unspecified site: Secondary | ICD-10-CM

## 2020-01-17 DIAGNOSIS — Z9011 Acquired absence of right breast and nipple: Secondary | ICD-10-CM | POA: Diagnosis not present

## 2020-01-17 DIAGNOSIS — Z853 Personal history of malignant neoplasm of breast: Secondary | ICD-10-CM | POA: Insufficient documentation

## 2020-01-17 DIAGNOSIS — C50811 Malignant neoplasm of overlapping sites of right female breast: Secondary | ICD-10-CM | POA: Diagnosis not present

## 2020-01-17 DIAGNOSIS — C50212 Malignant neoplasm of upper-inner quadrant of left female breast: Secondary | ICD-10-CM | POA: Diagnosis not present

## 2020-01-17 DIAGNOSIS — Z87891 Personal history of nicotine dependence: Secondary | ICD-10-CM | POA: Diagnosis not present

## 2020-01-17 DIAGNOSIS — Z171 Estrogen receptor negative status [ER-]: Secondary | ICD-10-CM | POA: Diagnosis not present

## 2020-01-17 DIAGNOSIS — I63412 Cerebral infarction due to embolism of left middle cerebral artery: Secondary | ICD-10-CM | POA: Diagnosis not present

## 2020-01-17 DIAGNOSIS — Z170 Estrogen receptor positive status [ER+]: Secondary | ICD-10-CM | POA: Diagnosis not present

## 2020-01-17 NOTE — Progress Notes (Signed)
[place]  [phone] [phone]     ID: Adrienne [name] DOB: 1927-09-25  MR#: [id number]  ION#:[id number]  Patient Care Team: Kathyrn Lass, MD as PCP - General (Family Medicine) Yisroel Ramming, Everardo All, MD as Consulting Physician (Obstetrics and Gynecology) Kiosha Buchan, Virgie Dad, MD as Consulting Physician (Oncology) Jerline Pain, MD as Consulting Physician (Cardiology) Sydnee Levans, MD as Referring Physician (Dermatology) Garner Nash, DO as Consulting Physician (Pulmonary Disease) Rolm Bookbinder, MD as Consulting Physician (General Surgery) Chauncey Cruel, MD OTHER MD:  CHIEF COMPLAINT: Bilateral breast cancer,  estrogen receptor positive (s/p right mastectomy)  CURRENT TREATMENT: Definitive surgery pending   INTERVAL HISTORY: [name] returns today for evaluation of her newly diagnosed left breast cancer accompanied by her husband.   Since her last visit, she underwent chest CT on 09/09/2019 showing: numerous bilateral nodules; enlarged right lower lobe nodule along the posterior right hemidiaphragm. She underwent PET scan on 09/20/2019 showing: multifocal bilateral pulmonary nodules, with nonspecific mild FDG uptake; nonspecific mild FDG uptake associated with low right paratracheal lymph node.  Repeat CT of the chest 01/04/2020 showed improvement in these nonspecific lesions and MAI was suggested.  Her lung issues are followed closely by Dr. Valeta Harms.  [name] had routine left screening mammography on 11/19/2019 showing a possible abnormality. She underwent left diagnostic mammography with tomography and left breast ultrasonography at [facility] on 12/05/2019 showing: breast density category C; 0.7 cm left breast mass at 11 o'clock; development of three left breast cysts compared to prior imaging from [place] in 11/2017; no left axillary adenopathy.  Accordingly on 12/10/2019 she proceeded to biopsy of the left breast area in  question. The pathology from this procedure ([id number]) showed: invasive ductal carcinoma, grade 3; ductal carcinoma in situ. Prognostic indicators significant for: estrogen receptor, 0% negative and progesterone receptor, 0% negative. Proliferation marker Ki67 at 90%. HER2 negative by immunohistochemistry (0).  Her case has been presented at the multidisciplinary breast cancer conference and she has also met with the radiation oncology.  She is scheduled for left lumpectomy 01/21/2020 to be followed by adjuvant radiation.  At this point we are not planning on adjuvant chemotherapy   REVIEW OF SYSTEMS: Arshiya is fully independent in all activities of daily living.  She does not otherwise exercise.  She has had a recent urinary tract infection and is on amoxicillin, which she is tolerating with no GI concerns so far.  She denies pleurisy, hemoptysis, productive sputum or even significant cough.  A detailed review of systems was otherwise stable.  HISTORY OF CURRENT ILLNESS: From the original intake note:  [name] has a right breast mass noted by her primary care doctor in [place] in 2012.  She was referred to surgery and underwent right mastectomy and axillary lymph node dissection or sampling (we do not have the pathology report) 06/04/2011 for what appears to have been a multicentric invasive ductal carcinoma, grade 2.  There were three separate tumor foci, spanning a total of 3.8 cm. Prognostic indicators were: ER positive, PR positive, and Her2 negative. One out of 3 biopsied lymph nodes was positive for micrometastasis.   The patient's subsequent history is as detailed below.   PAST MEDICAL HISTORY: Past Medical History:  Diagnosis Date  . Afib ([place])   . Arthritis of ankle   . CAD (coronary artery disease)   . Cancer [facility]) 2012   breast cancer-right  . Compression fracture of T12 vertebra (HCC)   . Diverticulosis   .  Edema   . Elevated uric acid in blood   . GERD  (gastroesophageal reflux disease)   . GI bleed 12/2015  . Hyperlipidemia   . Hypertension   . Long-term (current) use of anticoagulants   . Moderate mitral regurgitation   . Pulmonary HTN ([place])    Echo 1/19: EF 60-65, no RWMA, Ao sclerosis, trivial AI, mild MR, mod BAE, mod TR, PASP 49  . Stroke ([place]) 01/25/2016  . TIA (transient ischemic attack) 09/2011  . Urinary incontinence   . Urinary retention     PAST SURGICAL HISTORY: Past Surgical History:  Procedure Laterality Date  . APPENDECTOMY    . BREAST SURGERY  2012   Rt.mastectomy--[place], [place]  2013  . CATARACT EXTRACTION, BILATERAL    . DILATION AND CURETTAGE OF UTERUS     in her early 35's for DUB  . ESOPHAGOGASTRODUODENOSCOPY Left 01/08/2016   Procedure: ESOPHAGOGASTRODUODENOSCOPY (EGD);  Surgeon: Arta Silence, MD;  Location: [facility];  Service: Endoscopy;  Laterality: Left;  . GALLBLADDER SURGERY    . MASTECTOMY Right   . RADIOLOGY WITH ANESTHESIA N/A 01/25/2016   Procedure: RADIOLOGY WITH ANESTHESIA;  Surgeon: Luanne Bras, MD;  Location: [facility];  Service: Radiology;  Laterality: N/A;  . REPLACEMENT TOTAL KNEE BILATERAL    . TONSILLECTOMY    . TONSILLECTOMY AND ADENOIDECTOMY     as a child    FAMILY HISTORY: Family History  Problem Relation Age of Onset  . Asthma Mother   . CVA Father   . Atrial fibrillation Sister        HAS PACER  . Pneumonia Brother   . Cancer Daughter        COLON CANCER  . Cancer Maternal Grandmother        breast cancer   Patient's father was 17 years old when he died from stroke. Patient's mother died from coronary artery disease at age 66. The patient denies a family hx of ovarian cancer. She reports breast cancer in her maternal grandmother. She has 2 siblings, 1 brother and 1 sister. Her brother died from stroke/AIDS. She also reports pancreatic cancer in her paternal grandmother and colon cancer in her daughter.   GYNECOLOGIC HISTORY:  Patient's  last menstrual period was 09/12/1973 (approximate). Menarche: 86 years old Age at first live birth: 86 years old [place] P 3 LMP late 66s Contraceptive never HRT yes, remote , less than 6 months Hysterectomy?  No BSO?  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  Her husband [name] is a Marketing executive, (PhD, worked at [employer]).  Their children are Lockie Pares who lives in [place] and whose husband is a English as a second language teacher; Blair [employer]") who lives in [place] and works in finances; and Mullan, who lives in [place] and works for [employer].  The patient has 11 grandchildren and 8 great-grandchildren.  She attends [employer] locally    ADVANCED DIRECTIVES: In the absence of documents to the contrary the patient's husband is her healthcare power of attorney   HEALTH MAINTENANCE: Social History   Tobacco Use  . Smoking status: Former Smoker    Packs/day: 0.25    Years: 2.00    Pack years: 0.50    Types: Cigarettes    Quit date: 1950    Years since quitting: 71.3  . Smokeless tobacco: Never Used  Substance Use Topics  .  Alcohol use: No    Alcohol/week: 0.0 standard drinks  . Drug use: No     Colonoscopy: Remote  PAP: 11/2015, negative  Bone density: Osteopenia   Allergies  Allergen Reactions  . Valium [Diazepam] Other (See Comments)    HYPERACTIVITY   . Amiodarone Hcl [Amiodarone] Other (See Comments)    AFIB  . Compazine [Prochlorperazine] Other (See Comments)    HYPERACTIVITY   . Other Other (See Comments)    "Kidney dye"--patient states this gave her severe shakes/chills  . Thorazine [Chlorpromazine] Other (See Comments)    HYPERACTIVITY   . Zometa [Zoledronic Acid] Other (See Comments)    PROBLEMS WITH EYES  TIA  . Ciprofloxacin Swelling    "swelling" in Rt.elbow    Current Outpatient Medications  Medication Sig Dispense Refill  . acetaminophen (TYLENOL)  500 MG tablet Take 500-1,000 mg by mouth every 12 (twelve) hours as needed (pain).     Marland Kitchen amoxicillin (AMOXIL) 500 MG capsule Take 2,000 mg by mouth See admin instructions. Take 4 capsules (2000 mg) by mouth 1 hour prior to dental appointments.    . Ascorbic Acid (VITAMIN C WITH ROSE HIPS) 500 MG tablet Take 500 mg by mouth in the morning and at bedtime.    Marland Kitchen atorvastatin (LIPITOR) 10 MG tablet TAKE 1 TABLET DAILY (Patient taking differently: Take 10 mg by mouth daily in the afternoon. (1600)) 90 tablet 3  . Calcium Citrate-Vitamin D (SM CALCIUM CITRATE+D3 PETITE PO) Take 1 tablet by mouth in the morning, at noon, and at bedtime.    Marland Kitchen diltiazem (CARDIZEM) 60 MG tablet TAKE 1 TABLET THREE TIMES A DAY (Patient taking differently: Take 60 mg by mouth in the morning, at noon, and at bedtime. ) 270 tablet 3  . ELIQUIS 5 MG TABS tablet TAKE 1 TABLET TWICE A DAY (Patient taking differently: Take 5 mg by mouth 2 (two) times daily. ) 180 tablet 1  . furosemide (LASIX) 20 MG tablet Take 1 tablet (20 mg total) by mouth daily. 90 tablet 3  . metoprolol tartrate (LOPRESSOR) 100 MG tablet TAKE 1 TABLET TWICE A DAY (Patient taking differently: Take 100 mg by mouth in the morning and at bedtime. ) 180 tablet 3  . Multiple Vitamin (MULTIVITAMIN WITH MINERALS) TABS tablet Take 1 tablet by mouth daily. Centrum for Women 50+    . nitrofurantoin (MACRODANTIN) 100 MG capsule Take 100 mg by mouth at bedtime.     Marland Kitchen omeprazole (PRILOSEC) 10 MG capsule Take 10 mg by mouth daily before breakfast.     . saccharomyces boulardii (FLORASTOR) 250 MG capsule Take 250 mg by mouth 2 (two) times daily.     No current facility-administered medications for this visit.    OBJECTIVE: White woman who appears stated age  86:   01/17/20 1052  BP: (!) 162/58  Pulse: (!) 59  Resp: 17  Temp: 98.7 F (37.1 C)  SpO2: 97%     Body mass index is 28.83 kg/m.   Wt Readings from Last 3 Encounters:  01/17/20 147 lb 9.6 oz (67 kg)    01/14/20 149 lb 1.6 oz (67.6 kg)  01/07/20 150 lb (68 kg)      ECOG FS:1 - Symptomatic but completely ambulatory  Sclerae unicteric, EOMs intact Wearing a mask No cervical or supraclavicular adenopathy Lungs no rales or rhonchi Heart regular rate and rhythm Abd soft, nontender, positive bowel sounds MSK kyphosis but no focal spinal tenderness, no upper extremity lymphedema Neuro: nonfocal, well oriented, appropriate  affect Breasts: The right breast is status post mastectomy.  There is no evidence of local recurrence.  The left breast is status post recent biopsy.  There is a significant ecchymosis.  Both axillae are benign.   LAB RESULTS:  CMP     Component Value Date/Time   NA 138 01/14/2020 1038   NA 145 (H) 05/06/2019 1608   K 4.1 01/14/2020 1038   CL 100 01/14/2020 1038   CO2 28 01/14/2020 1038   GLUCOSE 102 (H) 01/14/2020 1038   BUN 18 01/14/2020 1038   BUN 19 05/06/2019 1608   CREATININE 1.15 (H) 01/14/2020 1038   CALCIUM 9.9 01/14/2020 1038   PROT 7.8 12/20/2019 1010   PROT 6.6 05/06/2019 1608   ALBUMIN 3.8 12/20/2019 1010   ALBUMIN 3.9 05/06/2019 1608   AST 13 (L) 12/20/2019 1010   ALT 12 12/20/2019 1010   ALKPHOS 93 12/20/2019 1010   BILITOT 0.5 12/20/2019 1010   BILITOT 0.4 05/06/2019 1608   GFRNONAA 42 (L) 01/14/2020 1038   GFRAA 49 (L) 01/14/2020 1038    No results found for: TOTALPROTELP, ALBUMINELP, A1GS, A2GS, BETS, BETA2SER, GAMS, MSPIKE, SPEI  No results found for: KPAFRELGTCHN, LAMBDASER, KAPLAMBRATIO  Lab Results  Component Value Date   WBC 12.1 (H) 01/14/2020   NEUTROABS 6.6 12/20/2019   HGB 13.1 01/14/2020   HCT 41.5 01/14/2020   MCV 95.2 01/14/2020   PLT 245 01/14/2020    _0 @  No results found for: LABCA2  No components found for: PNTIRW431  No results for input(s): INR in the last 168 hours.  No results found for: LABCA2  No results found for: VQM086  No results found for: PYP950  No results found for:  DTO671  No results found for: CA2729  No components found for: HGQUANT  No results found for: CEA1 / No results found for: CEA1   No results found for: AFPTUMOR  No results found for: CHROMOGRNA  No results found for: HGBA, HGBA2QUANT, HGBFQUANT, HGBSQUAN (Hemoglobinopathy evaluation)   No results found for: LDH  No results found for: IRON, TIBC, IRONPCTSAT (Iron and TIBC)  No results found for: FERRITIN  Urinalysis    Component Value Date/Time   COLORURINE YELLOW 08/29/2017 2343   APPEARANCEUR CLOUDY (A) 08/29/2017 2343   LABSPEC 1.005 08/29/2017 2343   PHURINE 6.0 08/29/2017 2343   GLUCOSEU NEGATIVE 08/29/2017 2343   HGBUR SMALL (A) 08/29/2017 2343   BILIRUBINUR n 02/08/2018 1651   KETONESUR NEGATIVE 08/29/2017 2343   PROTEINUR Positive (A) 02/08/2018 1651   PROTEINUR NEGATIVE 08/29/2017 2343   UROBILINOGEN 0.2 02/08/2018 1651   NITRITE positive 02/08/2018 1651   NITRITE NEGATIVE 08/29/2017 2343   LEUKOCYTESUR Large (3+) (A) 02/08/2018 1651    STUDIES: CT Chest Wo Contrast  Result Date: 01/04/2020 CLINICAL DATA:  Follow-up indeterminate pulmonary nodules. Personal history of breast carcinoma. EXAM: CT CHEST WITHOUT CONTRAST TECHNIQUE: Multidetector CT imaging of the chest was performed following the standard protocol without IV contrast. COMPARISON:  09/09/2019 FINDINGS: Cardiovascular: No acute findings. Stable mild cardiomegaly. Aortic and coronary artery atherosclerosis incidentally noted. Mediastinum/Nodes: No masses or pathologically enlarged lymph nodes identified on this unenhanced exam. Prior right mastectomy. No evidence of axillary lymphadenopathy. Lungs/Pleura: Multiple previously seen ill-defined solid and ground-glass nodular opacities have decreased in size or resolved since previous study. 11 mm solid nodular opacity in the posterior right lower lobe abutting the diaphragm on image 119/8 remains stable. A circumscribed solid nodule in the right middle lobe  measuring 7 mm  on image 74/8 is also unchanged. A 6 mm ill-defined nodule in the posterior right upper lobe on image 22/8, and a 5 mm nodule in the lateral right middle lobe on image 61/8, are new since previous study. These findings are most suggestive of a waxing and waning inflammatory or infectious etiology such as MAI. No evidence of pulmonary consolidation or pleural effusion. Upper Abdomen:  Unremarkable. Musculoskeletal:  No suspicious bone lesions. IMPRESSION: 1. Majority of bilateral pulmonary nodular opacities have decreased in size or resolved since previous study, although 2 sub-cm nodules in the right upper and middle lobes are new. These findings are most suggestive of a waxing and waning atypical infectious or inflammatory etiology, possibly MAI. Recommend continued follow-up by chest CT without contrast in 6 months. 2. No evidence of lymphadenopathy or pleural effusion. Aortic Atherosclerosis (ICD10-I70.0). Electronically Signed   By: Marlaine Hind M.D.   On: 01/04/2020 12:01    ELIGIBLE FOR AVAILABLE RESEARCH PROTOCOL: No  ASSESSMENT: 86 y.o. [place] woman   RIGHT BREAST CANCER (1) status post right mastectomy and sentinel lymph node sampling September 2012 for what appears to have been a multifocal T1-T2 N1 (mic) invasive ductal carcinoma, estrogen and progesterone receptor positive, HER-2 not amplified  (2) completed 5 years of anastrozole 2017  LEFT BREAST CANCER (3) status post left breast upper inner quadrant biopsy 12/10/2019 for a clinical T1b N0, stage IB invasive ductal carcinoma grade 3, triple negative, with an MIB-1 of 90%  (4) definitive surgery pending  (5) adjuvant chemotherapy discussed, opted against  (6) meets criteria for genetics testing  (7) adjuvant radiation   PLAN: Mortimer Fries has made what I think is a good decision to proceed with lumpectomy on the left breast.  She has also met with radiation and is planning on adjuvant radiation.  I think she will  have excellent local control.  I am hopeful that the tumor will prove a small pathologically acid did radiographically.  In that case the risk of systemic recurrence will be low.  She understands if the tumor does recur systemically the only option we will have is chemotherapy and would not be curable at this point.  However I do not think this 86 year old despite her relatively good functional status could tolerate curative chemotherapy in any case.  Accordingly the plan is for surgery radiation and then observation.  I will plan to see her in August and follow-up will be simplified by the fact that her pulmonary physician is obtaining frequent chest imaging which would alert [place] to early breast cancer relapse.  We will follow up possible genetics testing at the next visit  Total encounter time 30 minutes.Chauncey Cruel, MD   01/17/2020 3:59 PM Medical Oncology and Hematology [facility] [place], [place] [address] Tel. [phone]    Fax. [phone]   This document serves as a record of services personally performed by Lurline Del, MD. It was created on his behalf by Wilburn Mylar, a trained medical scribe. The creation of this record is based on the scribe's personal observations and the provider's statements to them.   I, Lurline Del MD, have reviewed the above documentation for accuracy and completeness, and I agree with the above.   *Total Encounter Time as defined by the Centers for Medicare and Medicaid Services includes, in addition to the face-to-face time of a patient visit (documented in the note above) non-face-to-face time: obtaining and reviewing outside history, ordering and reviewing medications, tests or  procedures, care coordination (communications with other health care professionals or caregivers) and documentation in the medical record.

## 2020-01-18 ENCOUNTER — Other Ambulatory Visit (HOSPITAL_COMMUNITY)
Admission: RE | Admit: 2020-01-18 | Discharge: 2020-01-18 | Disposition: A | Discharge status: Home / Self Care | Payer: Medicare Other | Source: Ambulatory Visit | Attending: General Surgery | Admitting: General Surgery

## 2020-01-18 DIAGNOSIS — Z20822 Contact with and (suspected) exposure to covid-19: Secondary | ICD-10-CM | POA: Diagnosis not present

## 2020-01-18 DIAGNOSIS — Z01812 Encounter for preprocedural laboratory examination: Secondary | ICD-10-CM | POA: Diagnosis not present

## 2020-01-18 LAB — SARS CORONAVIRUS 2 (TAT 6-24 HRS): SARS Coronavirus 2: NEGATIVE

## 2020-01-20 ENCOUNTER — Telehealth: Payer: Self-pay | Admitting: Oncology

## 2020-01-20 ENCOUNTER — Ambulatory Visit (INDEPENDENT_AMBULATORY_CARE_PROVIDER_SITE_OTHER): Payer: Medicare Other | Admitting: Pulmonary Disease

## 2020-01-20 ENCOUNTER — Other Ambulatory Visit: Payer: Self-pay

## 2020-01-20 DIAGNOSIS — R918 Other nonspecific abnormal finding of lung field: Secondary | ICD-10-CM | POA: Diagnosis not present

## 2020-01-20 NOTE — Telephone Encounter (Signed)
Scheduled appts per 5/7 los. Pt confirmed appt date and time.  

## 2020-01-20 NOTE — Progress Notes (Signed)
Virtual Visit via Telephone Note  I connected with Adrienne [name] on 01/20/20 at  1:30 PM EDT by telephone and verified that I am speaking with the correct person using two identifiers.  Location: Patient: Adrienne [name] Provider: Garner Nash, DO    I discussed the limitations, risks, security and privacy concerns of performing an evaluation and management service by telephone and the availability of in person appointments. I also discussed with the patient that there may be a patient responsible charge related to this service. The patient expressed understanding and agreed to proceed.   History of Present Illness:  This is an 86 year old female past medical history of atrial fibrillation on oral anticoagulation, hyperlipidemia, hypertension, history of breast cancer status post mastectomy. Patient was initially seen in consultation by Dr. Kipp Brood on 10/04/2019, office visit reviewed. Dr. Kipp Brood felt as if it be better for [place] to follow-up in pulmonary clinic instead of surgery. Additionally patient was seen in December 2020 by cardiology Dr. Marlou Porch office note reviewed. Currently being managed for atrial fibrillation permanent A. fib on Eliquis. Patient had a prior diagnosis of pulmonary hypertension however follow-up echocardiogram reveals normal RV function. Patient states that she has had lung nodules for several years. Dating back since 2015. She also had images in 2017 from an abdominal CT which showed a 4 mm lower lobe lesion. She ultimately had follow-up for this. She had a CT scan in December 2020 which revealed numerous bilateral pulmonary nodules both solid subsolid and groundglass. The right middle lobe nodule stable since the 2017 and compared to 7. This is considered benign. A new right lower lobe nodule in the posterior aspect which is slightly enlarged. Otherwise stable. Patient had pet imaging for follow-up in January 2021 this revealed multiple bilateral  pulmonary nodules again all of which were nonspecific. Patient had no evidence of axillary supraclavicular hilar adenopathy there is a small 1 cm low right paratracheal lymph node with an SUV of 3.05. The remainder of the lesions there is a small subpleural right lower lobe lesion at 1.1 cm with an SUV max of 2.19 the remainder nodules subpleural within the apex with an SUV max 2.12. Multiple scattered other nodules. All subcentimeter in size.  OV 11/06/2019 telephone visit.  Spoke with the patient today about ongoing dry cough.  She has had this for some time.  She was just seen in the office a few weeks ago.  She states that she forgot to talk about the dry cough because she was more worried about the lung nodules that she was referred for.  She does state that the dry cough has been persistent for some time.  I explained that it could potentially be related to the things that we seen in her chest cavity but she also has a history of gastroesophageal reflux disease and currently only takes a PPI every other day.  She also stays up very late goes to bed past midnight and eats when she wants.  She also sleeps flat in the bed.  She does not have any significant swallowing trouble.  Cough is nonproductive.  Patient denies hemoptysis.  OV 01/20/2020: Telephone visit.  Spoke today with the patient regarding her ongoing cough.  She had a recent CAT scan of the chest reveals scattered pulmonary nodules possibly concerning for underlying MAI.  Of note she had recent mammogram which had a concerning image of the left breast.  She has plans for sentinel node biopsy and lumpectomy by Dr. Donne Hazel this week.  Otherwise has no additional complaints.  She still has her persistent cough which is nonproductive.  Denies hemoptysis.  She takes her PPI for reflux.    Observations/Objective: Nonlabored breathing.  Comfortable able to sentences  Assessment and Plan: Multiple pulmonary nodules. Abnormal CT chest,  possible underlying MAI. Nonproductive cough Left breast lesion plan for surgery this week  Plan: I discussed with the fact that at her current age and scattered inflammatory appearing lung nodules that a work-up to require bronchoscopy or any other invasive tissue diagnosis to figure out if she has an underlying chronic infection such as MAI would likely not change management.  She does not have productive cough. Also the treatments for potential underlying MAI she is likely not to tolerate.  We discussed this via phone and she is in agreement. I think the next most important thing is for her to have the breast issues taking care of. She would like to have follow-up images of the chest to ensure any change or worsening.  We have we will schedule 34-month noncontrasted CT for follow-up. Patient can follow-up with [place] in clinic after the CT chest has complete.  Follow Up Instructions:  RTC in 9 months   I discussed the assessment and treatment plan with the patient. The patient was provided an opportunity to ask questions and all were answered. The patient agreed with the plan and demonstrated an understanding of the instructions.   The patient was advised to call back or seek an in-person evaluation if the symptoms worsen or if the condition fails to improve as anticipated.  I provided 16 minutes of non-face-to-face time during this encounter.   Garner Nash, DO

## 2020-01-21 ENCOUNTER — Ambulatory Visit
Admission: RE | Admit: 2020-01-21 | Discharge: 2020-01-21 | Disposition: A | Discharge status: Home / Self Care | Payer: Medicare Other | Source: Ambulatory Visit | Attending: General Surgery | Admitting: General Surgery

## 2020-01-21 DIAGNOSIS — N6320 Unspecified lump in the left breast, unspecified quadrant: Secondary | ICD-10-CM | POA: Diagnosis not present

## 2020-01-21 DIAGNOSIS — Z171 Estrogen receptor negative status [ER-]: Secondary | ICD-10-CM

## 2020-01-22 ENCOUNTER — Encounter (HOSPITAL_COMMUNITY): Payer: Self-pay | Admitting: General Surgery

## 2020-01-22 ENCOUNTER — Other Ambulatory Visit: Payer: Self-pay

## 2020-01-22 ENCOUNTER — Ambulatory Visit
Admission: RE | Admit: 2020-01-22 | Discharge: 2020-01-22 | Disposition: A | Discharge status: Home / Self Care | Payer: Medicare Other | Source: Ambulatory Visit | Attending: General Surgery | Admitting: General Surgery

## 2020-01-22 ENCOUNTER — Ambulatory Visit (HOSPITAL_COMMUNITY): Payer: Medicare Other | Admitting: Physician Assistant

## 2020-01-22 ENCOUNTER — Ambulatory Visit (HOSPITAL_COMMUNITY): Payer: Medicare Other | Admitting: Anesthesiology

## 2020-01-22 ENCOUNTER — Ambulatory Visit (HOSPITAL_COMMUNITY)
Admission: RE | Admit: 2020-01-22 | Discharge: 2020-01-22 | Disposition: A | Discharge status: Home / Self Care | Payer: Medicare Other | Source: Ambulatory Visit | Attending: General Surgery | Admitting: General Surgery

## 2020-01-22 ENCOUNTER — Observation Stay (HOSPITAL_COMMUNITY)
Admission: RE | Admit: 2020-01-22 | Discharge: 2020-01-23 | Disposition: A | Discharge status: Home / Self Care | Payer: Medicare Other | Attending: General Surgery | Admitting: General Surgery

## 2020-01-22 ENCOUNTER — Encounter (HOSPITAL_COMMUNITY)
Admission: RE | Disposition: A | Discharge status: Home / Self Care | Payer: Self-pay | Source: Home / Self Care | Attending: General Surgery

## 2020-01-22 DIAGNOSIS — Z170 Estrogen receptor positive status [ER+]: Secondary | ICD-10-CM | POA: Diagnosis not present

## 2020-01-22 DIAGNOSIS — Z8673 Personal history of transient ischemic attack (TIA), and cerebral infarction without residual deficits: Secondary | ICD-10-CM | POA: Diagnosis not present

## 2020-01-22 DIAGNOSIS — N6012 Diffuse cystic mastopathy of left breast: Secondary | ICD-10-CM | POA: Diagnosis not present

## 2020-01-22 DIAGNOSIS — I2510 Atherosclerotic heart disease of native coronary artery without angina pectoris: Secondary | ICD-10-CM | POA: Diagnosis not present

## 2020-01-22 DIAGNOSIS — N6489 Other specified disorders of breast: Secondary | ICD-10-CM | POA: Diagnosis not present

## 2020-01-22 DIAGNOSIS — C50912 Malignant neoplasm of unspecified site of left female breast: Secondary | ICD-10-CM | POA: Diagnosis not present

## 2020-01-22 DIAGNOSIS — Z79899 Other long term (current) drug therapy: Secondary | ICD-10-CM | POA: Insufficient documentation

## 2020-01-22 DIAGNOSIS — I4891 Unspecified atrial fibrillation: Secondary | ICD-10-CM | POA: Diagnosis not present

## 2020-01-22 DIAGNOSIS — Z171 Estrogen receptor negative status [ER-]: Secondary | ICD-10-CM

## 2020-01-22 DIAGNOSIS — Z7901 Long term (current) use of anticoagulants: Secondary | ICD-10-CM | POA: Diagnosis not present

## 2020-01-22 DIAGNOSIS — I10 Essential (primary) hypertension: Secondary | ICD-10-CM | POA: Diagnosis not present

## 2020-01-22 DIAGNOSIS — K219 Gastro-esophageal reflux disease without esophagitis: Secondary | ICD-10-CM | POA: Diagnosis not present

## 2020-01-22 DIAGNOSIS — Z853 Personal history of malignant neoplasm of breast: Secondary | ICD-10-CM | POA: Diagnosis not present

## 2020-01-22 DIAGNOSIS — C50412 Malignant neoplasm of upper-outer quadrant of left female breast: Principal | ICD-10-CM | POA: Insufficient documentation

## 2020-01-22 DIAGNOSIS — N6320 Unspecified lump in the left breast, unspecified quadrant: Secondary | ICD-10-CM | POA: Diagnosis not present

## 2020-01-22 DIAGNOSIS — C50212 Malignant neoplasm of upper-inner quadrant of left female breast: Secondary | ICD-10-CM

## 2020-01-22 DIAGNOSIS — C50811 Malignant neoplasm of overlapping sites of right female breast: Secondary | ICD-10-CM | POA: Diagnosis not present

## 2020-01-22 DIAGNOSIS — Z87891 Personal history of nicotine dependence: Secondary | ICD-10-CM | POA: Insufficient documentation

## 2020-01-22 DIAGNOSIS — N6092 Unspecified benign mammary dysplasia of left breast: Secondary | ICD-10-CM | POA: Diagnosis not present

## 2020-01-22 DIAGNOSIS — G8918 Other acute postprocedural pain: Secondary | ICD-10-CM | POA: Diagnosis not present

## 2020-01-22 HISTORY — PX: BREAST LUMPECTOMY WITH RADIOACTIVE SEED AND SENTINEL LYMPH NODE BIOPSY: SHX6550

## 2020-01-22 HISTORY — PX: AXILLARY SENTINEL NODE BIOPSY: SHX5738

## 2020-01-22 LAB — PROTIME-INR
INR: 1.1 (ref 0.8–1.2)
Prothrombin Time: 14.1 seconds (ref 11.4–15.2)

## 2020-01-22 SURGERY — BREAST LUMPECTOMY WITH RADIOACTIVE SEED AND SENTINEL LYMPH NODE BIOPSY
Anesthesia: General | Site: Breast | Laterality: Left

## 2020-01-22 MED ORDER — SIMETHICONE 80 MG PO CHEW
40.0000 mg | CHEWABLE_TABLET | Freq: Four times a day (QID) | ORAL | Status: DC | PRN
  Filled 2020-01-22: qty 1

## 2020-01-22 MED ORDER — ONDANSETRON HCL 4 MG/2ML IJ SOLN: 4.0000 mg | Freq: Four times a day (QID) | INTRAMUSCULAR | Status: DC | PRN

## 2020-01-22 MED ORDER — FENTANYL CITRATE (PF) 100 MCG/2ML IJ SOLN
INTRAMUSCULAR | Status: DC | PRN
  Administered 2020-01-22 (×2): 25 ug via INTRAVENOUS

## 2020-01-22 MED ORDER — LACTATED RINGERS IV SOLN: INTRAVENOUS | Status: DC

## 2020-01-22 MED ORDER — SODIUM CHLORIDE (PF) 0.9 % IJ SOLN
INTRAMUSCULAR | Status: AC
  Filled 2020-01-22: qty 10

## 2020-01-22 MED ORDER — TECHNETIUM TC 99M SULFUR COLLOID FILTERED
1.0000 | Freq: Once | INTRAVENOUS | Status: AC | PRN
  Administered 2020-01-22: 10:00:00 1 via INTRADERMAL

## 2020-01-22 MED ORDER — ENSURE PRE-SURGERY PO LIQD: 296.0000 mL | Freq: Once | ORAL | Status: DC

## 2020-01-22 MED ORDER — BUPIVACAINE HCL (PF) 0.25 % IJ SOLN
INTRAMUSCULAR | Status: AC
  Filled 2020-01-22: qty 30

## 2020-01-22 MED ORDER — PROPOFOL 10 MG/ML IV BOLUS
INTRAVENOUS | Status: DC | PRN
  Administered 2020-01-22: 10 mg via INTRAVENOUS
  Administered 2020-01-22: 90 mg via INTRAVENOUS

## 2020-01-22 MED ORDER — DEXAMETHASONE SODIUM PHOSPHATE 10 MG/ML IJ SOLN
INTRAMUSCULAR | Status: DC | PRN
Start: 2020-01-22 — End: 2020-01-22
  Administered 2020-01-22: 4 mg via INTRAVENOUS

## 2020-01-22 MED ORDER — OXYCODONE HCL 5 MG PO TABS: 5.0000 mg | ORAL_TABLET | Freq: Once | ORAL | Status: DC | PRN

## 2020-01-22 MED ORDER — ACETAMINOPHEN 500 MG PO TABS
ORAL_TABLET | ORAL | Status: AC
  Administered 2020-01-22: 1000 mg via ORAL
  Filled 2020-01-22: qty 2

## 2020-01-22 MED ORDER — SODIUM CHLORIDE 0.9 % IV SOLN: INTRAVENOUS | Status: DC

## 2020-01-22 MED ORDER — ONDANSETRON 4 MG PO TBDP
4.0000 mg | ORAL_TABLET | Freq: Four times a day (QID) | ORAL | Status: DC | PRN
  Filled 2020-01-22: qty 1

## 2020-01-22 MED ORDER — OXYCODONE HCL 5 MG/5ML PO SOLN: 5.0000 mg | Freq: Once | ORAL | Status: DC | PRN

## 2020-01-22 MED ORDER — METOPROLOL TARTRATE 25 MG PO TABS
100.0000 mg | ORAL_TABLET | Freq: Two times a day (BID) | ORAL | Status: DC
  Administered 2020-01-22 – 2020-01-23 (×2): 100 mg via ORAL
  Filled 2020-01-22 (×2): qty 4

## 2020-01-22 MED ORDER — ACETAMINOPHEN 500 MG PO TABS
1000.0000 mg | ORAL_TABLET | Freq: Four times a day (QID) | ORAL | Status: DC
  Administered 2020-01-22 – 2020-01-23 (×4): 1000 mg via ORAL
  Filled 2020-01-22 (×4): qty 2

## 2020-01-22 MED ORDER — MIDAZOLAM HCL 2 MG/2ML IJ SOLN
INTRAMUSCULAR | Status: AC
  Filled 2020-01-22: qty 2

## 2020-01-22 MED ORDER — PANTOPRAZOLE SODIUM 40 MG PO TBEC
40.0000 mg | DELAYED_RELEASE_TABLET | Freq: Every day | ORAL | Status: DC
  Administered 2020-01-23: 40 mg via ORAL
  Filled 2020-01-22: qty 1

## 2020-01-22 MED ORDER — 0.9 % SODIUM CHLORIDE (POUR BTL) OPTIME
TOPICAL | Status: DC | PRN
  Administered 2020-01-22: 1000 mL

## 2020-01-22 MED ORDER — ROPIVACAINE HCL 5 MG/ML IJ SOLN
INTRAMUSCULAR | Status: DC | PRN
  Administered 2020-01-22: 25 mL

## 2020-01-22 MED ORDER — DILTIAZEM HCL 60 MG PO TABS
60.0000 mg | ORAL_TABLET | Freq: Three times a day (TID) | ORAL | Status: DC
  Administered 2020-01-22 – 2020-01-23 (×3): 60 mg via ORAL
  Filled 2020-01-22 (×4): qty 1

## 2020-01-22 MED ORDER — FENTANYL CITRATE (PF) 250 MCG/5ML IJ SOLN
INTRAMUSCULAR | Status: AC
  Filled 2020-01-22: qty 5

## 2020-01-22 MED ORDER — CEFAZOLIN SODIUM-DEXTROSE 2-4 GM/100ML-% IV SOLN
2.0000 g | INTRAVENOUS | Status: AC
  Administered 2020-01-22: 11:00:00 2 g via INTRAVENOUS

## 2020-01-22 MED ORDER — FUROSEMIDE 20 MG PO TABS
20.0000 mg | ORAL_TABLET | Freq: Every day | ORAL | Status: DC
  Administered 2020-01-23: 20 mg via ORAL
  Filled 2020-01-22: qty 1

## 2020-01-22 MED ORDER — EPHEDRINE SULFATE-NACL 50-0.9 MG/10ML-% IV SOSY
PREFILLED_SYRINGE | INTRAVENOUS | Status: DC | PRN
  Administered 2020-01-22: 10 mg via INTRAVENOUS
  Administered 2020-01-22 (×2): 5 mg via INTRAVENOUS

## 2020-01-22 MED ORDER — BUPIVACAINE HCL (PF) 0.25 % IJ SOLN
INTRAMUSCULAR | Status: DC | PRN
  Administered 2020-01-22: 7 mL

## 2020-01-22 MED ORDER — CEFAZOLIN SODIUM-DEXTROSE 2-4 GM/100ML-% IV SOLN
INTRAVENOUS | Status: AC
  Filled 2020-01-22: qty 100

## 2020-01-22 MED ORDER — ATORVASTATIN CALCIUM 10 MG PO TABS: 10.0000 mg | ORAL_TABLET | Freq: Every day | ORAL | Status: DC

## 2020-01-22 MED ORDER — PHENYLEPHRINE 40 MCG/ML (10ML) SYRINGE FOR IV PUSH (FOR BLOOD PRESSURE SUPPORT)
PREFILLED_SYRINGE | INTRAVENOUS | Status: DC | PRN
  Administered 2020-01-22: 80 ug via INTRAVENOUS

## 2020-01-22 MED ORDER — FENTANYL CITRATE (PF) 100 MCG/2ML IJ SOLN: 50.0000 ug | Freq: Once | INTRAMUSCULAR | Status: AC

## 2020-01-22 MED ORDER — LIDOCAINE 2% (20 MG/ML) 5 ML SYRINGE
INTRAMUSCULAR | Status: DC | PRN
  Administered 2020-01-22: 20 mg via INTRAVENOUS
  Administered 2020-01-22: 40 mg via INTRAVENOUS

## 2020-01-22 MED ORDER — FENTANYL CITRATE (PF) 100 MCG/2ML IJ SOLN
INTRAMUSCULAR | Status: AC
  Administered 2020-01-22: 50 ug via INTRAVENOUS
  Filled 2020-01-22: qty 2

## 2020-01-22 MED ORDER — OXYCODONE HCL 5 MG PO TABS: 5.0000 mg | ORAL_TABLET | ORAL | Status: DC | PRN

## 2020-01-22 MED ORDER — GABAPENTIN 100 MG PO CAPS: 100.0000 mg | ORAL_CAPSULE | ORAL | Status: AC

## 2020-01-22 MED ORDER — ONDANSETRON HCL 4 MG/2ML IJ SOLN: 4.0000 mg | Freq: Once | INTRAMUSCULAR | Status: DC | PRN

## 2020-01-22 MED ORDER — METHYLENE BLUE 0.5 % INJ SOLN
INTRAVENOUS | Status: AC
  Filled 2020-01-22: qty 10

## 2020-01-22 MED ORDER — HEMOSTATIC AGENTS (NO CHARGE) OPTIME
TOPICAL | Status: DC | PRN
  Administered 2020-01-22: 1 via TOPICAL

## 2020-01-22 MED ORDER — ONDANSETRON HCL 4 MG/2ML IJ SOLN
INTRAMUSCULAR | Status: DC | PRN
  Administered 2020-01-22: 4 mg via INTRAVENOUS

## 2020-01-22 MED ORDER — FENTANYL CITRATE (PF) 100 MCG/2ML IJ SOLN: 25.0000 ug | INTRAMUSCULAR | Status: DC | PRN

## 2020-01-22 MED ORDER — ACETAMINOPHEN 500 MG PO TABS: 1000.0000 mg | ORAL_TABLET | ORAL | Status: AC

## 2020-01-22 MED ORDER — GABAPENTIN 100 MG PO CAPS
ORAL_CAPSULE | ORAL | Status: AC
  Administered 2020-01-22: 100 mg via ORAL
  Filled 2020-01-22: qty 1

## 2020-01-22 SURGICAL SUPPLY — 51 items
APPLIER CLIP 9.375 MED OPEN (MISCELLANEOUS) ×3
BINDER BREAST LRG (GAUZE/BANDAGES/DRESSINGS) IMPLANT
BINDER BREAST XLRG (GAUZE/BANDAGES/DRESSINGS) IMPLANT
BLADE SURG 15 STRL LF DISP TIS (BLADE) ×1 IMPLANT
BLADE SURG 15 STRL SS (BLADE) ×2
CANISTER SUCT 3000ML PPV (MISCELLANEOUS) ×3 IMPLANT
CHLORAPREP W/TINT 26 (MISCELLANEOUS) ×3 IMPLANT
CLIP APPLIE 9.375 MED OPEN (MISCELLANEOUS) ×1 IMPLANT
CLIP VESOCCLUDE MED 6/CT (CLIP) ×3 IMPLANT
CLOSURE STERI-STRIP 1/4X4 (GAUZE/BANDAGES/DRESSINGS) ×3 IMPLANT
CLOSURE WOUND 1/2 X4 (GAUZE/BANDAGES/DRESSINGS) ×1
CNTNR URN SCR LID CUP LEK RST (MISCELLANEOUS) ×1 IMPLANT
CONT SPEC 4OZ STRL OR WHT (MISCELLANEOUS) ×2
COVER PROBE W GEL 5X96 (DRAPES) ×3 IMPLANT
COVER SURGICAL LIGHT HANDLE (MISCELLANEOUS) ×3 IMPLANT
COVER WAND RF STERILE (DRAPES) ×3 IMPLANT
DERMABOND ADVANCED (GAUZE/BANDAGES/DRESSINGS) ×2
DERMABOND ADVANCED .7 DNX12 (GAUZE/BANDAGES/DRESSINGS) ×1 IMPLANT
DEVICE DUBIN SPECIMEN MAMMOGRA (MISCELLANEOUS) ×3 IMPLANT
DRAPE CHEST BREAST 15X10 FENES (DRAPES) ×3 IMPLANT
ELECT COATED BLADE 2.86 ST (ELECTRODE) ×3 IMPLANT
ELECT REM PT RETURN 9FT ADLT (ELECTROSURGICAL) ×3
ELECTRODE REM PT RTRN 9FT ADLT (ELECTROSURGICAL) ×1 IMPLANT
GLOVE BIO SURGEON STRL SZ7 (GLOVE) ×3 IMPLANT
GLOVE BIOGEL PI IND STRL 7.5 (GLOVE) ×1 IMPLANT
GLOVE BIOGEL PI INDICATOR 7.5 (GLOVE) ×2
GOWN STRL REUS W/ TWL LRG LVL3 (GOWN DISPOSABLE) ×2 IMPLANT
GOWN STRL REUS W/TWL LRG LVL3 (GOWN DISPOSABLE) ×4
HEMOSTAT ARISTA ABSORB 3G PWDR (HEMOSTASIS) ×3 IMPLANT
ILLUMINATOR WAVEGUIDE N/F (MISCELLANEOUS) IMPLANT
KIT BASIN OR (CUSTOM PROCEDURE TRAY) ×3 IMPLANT
KIT MARKER MARGIN INK (KITS) ×3 IMPLANT
NEEDLE 18GX1X1/2 (RX/OR ONLY) (NEEDLE) IMPLANT
NEEDLE FILTER BLUNT 18X 1/2SAF (NEEDLE)
NEEDLE FILTER BLUNT 18X1 1/2 (NEEDLE) IMPLANT
NEEDLE HYPO 25GX1X1/2 BEV (NEEDLE) ×3 IMPLANT
NS IRRIG 1000ML POUR BTL (IV SOLUTION) ×3 IMPLANT
PACK GENERAL/GYN (CUSTOM PROCEDURE TRAY) ×3 IMPLANT
PENCIL BUTTON HOLSTER BLD 10FT (ELECTRODE) ×3 IMPLANT
STRIP CLOSURE SKIN 1/2X4 (GAUZE/BANDAGES/DRESSINGS) ×2 IMPLANT
SUT MNCRL AB 4-0 PS2 18 (SUTURE) ×6 IMPLANT
SUT MON AB 5-0 PS2 18 (SUTURE) IMPLANT
SUT SILK 2 0 SH (SUTURE) ×6 IMPLANT
SUT VIC AB 2-0 SH 27 (SUTURE) ×8
SUT VIC AB 2-0 SH 27XBRD (SUTURE) ×4 IMPLANT
SUT VIC AB 3-0 SH 27 (SUTURE) ×8
SUT VIC AB 3-0 SH 27X BRD (SUTURE) ×3 IMPLANT
SUT VIC AB 3-0 SH 27XBRD (SUTURE) ×1 IMPLANT
SYR CONTROL 10ML LL (SYRINGE) ×3 IMPLANT
TOWEL GREEN STERILE (TOWEL DISPOSABLE) ×3 IMPLANT
TOWEL GREEN STERILE FF (TOWEL DISPOSABLE) ×3 IMPLANT

## 2020-01-22 NOTE — Anesthesia Postprocedure Evaluation (Signed)
Anesthesia Post Note  Patient: [employer]  Procedure(s) Performed: LEFT BREAST LUMPECTOMY WITH RADIOACTIVE SEED (Left Breast) Left Axillary Sentinel Node Biopsy (Left Breast)     Patient location during evaluation: PACU Anesthesia Type: General Level of consciousness: awake and alert Pain management: pain level controlled Vital Signs Assessment: post-procedure vital signs reviewed and stable Respiratory status: spontaneous breathing, nonlabored ventilation and respiratory function stable Cardiovascular status: blood pressure returned to baseline and stable Postop Assessment: no apparent nausea or vomiting Anesthetic complications: no    Last Vitals:  Vitals:   01/22/20 1230 01/22/20 1245  BP: (!) 134/50 (!) 126/54  Pulse: 66 61  Resp: 15 19  Temp:    SpO2: 100% 96%    Last Pain:  Vitals:   01/22/20 1215  TempSrc:   PainSc: 0-No pain                 Audry Pili

## 2020-01-22 NOTE — Transfer of Care (Signed)
Immediate Anesthesia Transfer of Care Note  Patient: Adrienne [name]  Procedure(s) Performed: LEFT BREAST LUMPECTOMY WITH RADIOACTIVE SEED (Left Breast) Left Axillary Sentinel Node Biopsy (Left Breast)  Patient Location: PACU  Anesthesia Type:General, Regional  Level of Consciousness: awake, patient cooperative and responds to stimulation  Airway & Oxygen Therapy: Patient Spontanous Breathing and Patient connected to face mask oxygen  Post-op Assessment: Report given to RN, Post -op Vital signs reviewed and stable and Patient moving all extremities X 4  Post vital signs: Reviewed and stable  Last Vitals:  Vitals Value Taken Time  BP    Temp    Pulse    Resp    SpO2      Last Pain:  Vitals:   01/22/20 0922  TempSrc:   PainSc: 0-No pain         Complications: No apparent anesthesia complications

## 2020-01-22 NOTE — H&P (Signed)
86 yof returns with family today to discuss plan. in 2012 in [place] she had a right mastectomy/alnd followed by anastrozole for right breast cancer. she has done fine since then. her husband is here with her and daughter is on the phone today. she had no ass or dc. she was undergoing routine mm. she has c density breasts. she was found to have possible masses in central breast and asymmetry in the medial aspect of the breast. compared to 2019 mm she has developed many cysts. US shows multiple cysts, no left ax adenopathy and an indeterminate mass at 11 oclock 3 cm from nipple biopsy was done and is a grade III IDC with DCIS that is [place]. she has pet ct that shows what oncology believes is no stage IV disease. I saw her previously and discussed options. no changes since then  Past Surgical History (Chanel Teressa Senter, [place]; 12/31/2019 4:39 PM) Appendectomy  Breast Biopsy  Bilateral. Cataract Surgery  Bilateral. Colon Polyp Removal - Colonoscopy  Gallbladder Surgery - Laparoscopic  Knee Surgery  Bilateral. Mastectomy  Right. Tonsillectomy   Diagnostic Studies History (Chanel Teressa Senter, CMA; 12/31/2019 4:39 PM) Colonoscopy  5-10 years ago Mammogram  within last year Pap Smear  1-5 years ago  Allergies (Chanel Teressa Senter, CMA; 12/31/2019 4:39 PM) Compazine *ANTIPSYCHOTICS/ANTIMANIC AGENTS*  Thorazine *ANTIPSYCHOTICS/ANTIMANIC AGENTS*  Iodinated Contrast Media  Amiodarone HCl *ANTIARRHYTHMICS*  Zometa *ENDOCRINE AND METABOLIC AGENTS - MISC.*  Acthar *ENDOCRINE AND METABOLIC AGENTS - MISC.*  Ciprofloxacin *FLUOROQUINOLONES*  Allergies Reconciled   Medication History (Chanel Teressa Senter, CMA; 12/31/2019 4:39 PM) Atorvastatin Calcium (10MG  Tablet, Oral) Active. Metoprolol Tartrate (100MG  Tablet, Oral) Active. Eliquis (5MG  Tablet, Oral) Active. Omeprazole (10MG  Capsule DR, Oral) Active. Nitrofurantoin Macrocrystal (100MG  Capsule, Oral) Active. Cefdinir (300MG  Capsule, Oral)  Active. Cephalexin (500MG  Capsule, Oral) Active. dilTIAZem HCl (60MG  Tablet, Oral) Active. Furosemide (20MG  Tablet, Oral) Active. Medications Reconciled  Social History Antonietta Jewel, CMA; 12/31/2019 4:39 PM) Alcohol use  Occasional alcohol use. Caffeine use  Coffee, Tea. No drug use  Tobacco use  Former smoker.  Family History Antonietta Jewel, [place]; 12/31/2019 4:39 PM) Alcohol Abuse  Father. Cancer  Sister. Cerebrovascular Accident  Father. Heart disease in female family member before age 49  Rectal Cancer  Daughter. Respiratory Condition  Brother, Mother.  Pregnancy / Birth History Antonietta Jewel, [place]; 12/31/2019 4:39 PM) Age at menarche  41 years. Age of menopause  18-50 Gravida  79 Maternal age  61-25 Para  3  Other Problems Antonietta Jewel, [place]; 12/31/2019 4:39 PM) Atrial Fibrillation  Bladder Problems  Breast Cancer  Cerebrovascular Accident  Gastroesophageal Reflux Disease  High blood pressure  Lump In Breast   Review of Systems (Chanel Nolan CMA; 12/31/2019 4:39 PM) General Not Present- Appetite Loss, Chills, Fatigue, Fever, Night Sweats, Weight Gain and Weight Loss. Skin Not Present- Change in Wart/Mole, Dryness, Hives, Jaundice, New Lesions, Non-Healing Wounds, Rash and Ulcer. HEENT Not Present- Earache, Hearing Loss, Hoarseness, Nose Bleed, Oral Ulcers, Ringing in the Ears, Seasonal Allergies, Sinus Pain, Sore Throat, Visual Disturbances, Wears glasses/contact lenses and Yellow Eyes. Respiratory Present- Chronic Cough. Not Present- Bloody sputum, Difficulty Breathing, Snoring and Wheezing. Cardiovascular Present- Shortness of Breath. Not Present- Chest Pain, Difficulty Breathing Lying Down, Leg Cramps, Palpitations, Rapid Heart Rate and Swelling of Extremities. Gastrointestinal Not Present- Abdominal Pain, Bloating, Bloody Stool, Change in Bowel Habits, Chronic diarrhea, Constipation, Difficulty Swallowing, Excessive gas, Gets full quickly at meals,  Hemorrhoids, Indigestion, Nausea, Rectal Pain and Vomiting. Female Genitourinary Present- Frequency, Nocturia and Urgency. Not Present-  Painful Urination and Pelvic Pain. Musculoskeletal Present- Swelling of Extremities. Not Present- Back Pain, Joint Pain, Joint Stiffness, Muscle Pain and Muscle Weakness. Neurological Present- Trouble walking. Not Present- Decreased Memory, Fainting, Headaches, Numbness, Seizures, Tingling, Tremor and Weakness. Hematology Present- Blood Thinners. Not Present- Easy Bruising, Excessive bleeding, Gland problems, HIV and Persistent Infections.  Vitals (Chanel Nolan CMA; 12/31/2019 4:39 PM) 12/31/2019 4:39 PM Weight: 151 lb Height: 60in Body Surface Area: 1.66 m Body Mass Index: 29.49 kg/m  Temp.: 98.28F  Pulse: 64 (Regular)   Physical Exam Rolm Bookbinder MD; 12/31/2019 9:56 PM) Breast Note: left breast with some skin changes related to tape cv rrr Lungs clear   Assessment & Plan Rolm Bookbinder MD; 12/31/2019 9:58 PM) BREAST CANCER OF UPPER-OUTER QUADRANT OF LEFT FEMALE BREAST (C50.412) Left lumpectomy, sn biopsy Story: We discussed a sentinel node biopsy as nodes appear negative and i think reasonable to do this in a 86 yo with tn tumor. Oncology would at least discuss chemo then and radiotherapy would be effected. We discussed the performance of that with injection of radioactive tracer. we will await the permanent pathology to make any other first further decisions in terms of her treatment. We discussed up to a 5-7% risk lifetime of chronic shoulder pain as well as lymphedema associated with a sentinel lymph node biopsy. We discussed the options for treatment of the breast cancer which included lumpectomy versus a mastectomy. We discussed the performance of the lumpectomy with radioactive seed placement. We discussed a 5-10% chance of a positive margin requiring reexcision in the operating room. We also discussed that she will likely need  radiation therapy if she undergoes lumpectomy. The breast cannot undergo more radiation therapy in the same breast after lumpectomy in the future. We discussed mastectomy and the postoperative care for that as well. Mastectomy can be followed by reconstruction. The decision for lumpectomy vs mastectomy has no impact on decision for chemotherapy. Most mastectomy patients will not need radiation therapy. We discussed that there is no difference in her survival whether she undergoes lumpectomy with radiation therapy or antiestrogen therapy versus a mastectomy. There is also no real difference between her recurrence in the breast. will discuss at tumor board and then call her back, will see her, her husband and daughter all next week I think less surgery is better for this patient on DOAC who has prior stroke off [facility]. she is going to see radiation oncology soon and then if she is agreeable to lump/sn we will schedule and proceed

## 2020-01-22 NOTE — Interval H&P Note (Signed)
History and Physical Interval Note:  01/22/2020 10:04 AM  Adrienne [name]  has presented today for surgery, with the diagnosis of LEFT BREAST CANCER.  The various methods of treatment have been discussed with the patient and family. After consideration of risks, benefits and other options for treatment, the patient has consented to  Procedure(s) with comments: LEFT BREAST LUMPECTOMY WITH RADIOACTIVE SEED AND LEFT AXILLARY SENTINEL LYMPH NODE BIOPSY (Left) - PEC BLOCK as a surgical intervention.  The patient's history has been reviewed, patient examined, no change in status, stable for surgery.  I have reviewed the patient's chart and labs.  Questions were answered to the patient's satisfaction.     Rolm Bookbinder

## 2020-01-22 NOTE — Op Note (Signed)
Preoperative diagnosis: clinical stage I left breast cancer Postoperative diagnosis: saa Procedure: 1. Left breast seed guided lumpectomy 2. Deep left axillary sentinel node biopsy Surgeon: Dr Serita Grammes Anesthesia: general with pec block EBL: 25 cc Specimens: 1. Left breast lumpectomy containing both seed and clip 2. Deep left axillary nodes with highest count was103 3. Additional left breast margins superior, inferior,  lateral, medial and posterior marked short superior , long lateral double deep Complications none Drains none Sponge and needle count correct dispo recovery stable  Indications:89 yof returns with family today to discuss plan. in 2012 in [place] she had a right mastectomy/alnd followed by anastrozole for right breast cancer. she has done fine since then. her husband is here with her and daughter is on the phone today. she had no ass or dc. she was undergoing routine mm. she has c density breasts. she was found to have possible masses in central breast and asymmetry in the medial aspect of the breast. compared to 2019 mm she has developed many cysts. US shows multiple cysts, no left ax adenopathy and an indeterminate mass at 11 oclock 3 cm from nipple biopsy was done and is a grade III IDC with DCIS that is [place]. she has pet ct that shows what oncology believes is no stage IV disease. I saw her previously and discussed options. no changes since then   Procedure: She hadher seedplaced prior to beginning. After informed consent was obtained she first underwent a pectoral block. Antibiotics were given. SCDs were in place. She underwent injection of technetium in the standard periareolar fashion. She was then placed under general anesthesia. She was prepped and draped in the standard sterile surgical fashion. A surgical timeout was then performed..  The tumor was in theupper inner quadrant and I infiltrated marcaine and made a crescent incision including the  skin.I then used the neoprobe to remove the breast cancer with an attempt to get a clear margin. Mammogram confirmed removal of the seed and clip.I did remove additional margins. I then obtained hemostasis. I then placed clips in the cavity.I pulled the breast tissue together with 2-0 vicryl. The skin was closed with 3-0 vicryl and 4-0 monocryl. Glue and steristrips were placed  I then made a curvilinear incision below the axillary hairline. There was radioactivity noted.I then entered the axilla. I removed what appear to be a couple sentinel nodes with highest radioactivity as above. There was no background radioactivity. Hemostasis was obtained. I closed the axillary fasciawith 2-0 Vicryl. I then closed the dermis with 3-0 Vicryl and the skin with 4-0 Monocryl. Glue and Steri-Strips were applied. She tolerated this well. A binder was placed. She was extubated and transferred to recovery in stable condition.

## 2020-01-22 NOTE — Anesthesia Procedure Notes (Signed)
Anesthesia Regional Block: Pectoralis block   Pre-Anesthetic Checklist: ,, timeout performed, Correct Patient, Correct Site, Correct Laterality, Correct Procedure, Correct Position, site marked, Risks and benefits discussed,  Surgical consent,  Pre-op evaluation,  At surgeon's request and post-op pain management  Laterality: Left  Prep: chloraprep       Needles:  Injection technique: Single-shot  Needle Type: Echogenic Needle     Needle Length: 10cm  Needle Gauge: 21     Additional Needles:   Narrative:  Start time: 01/22/2020 10:12 AM End time: 01/22/2020 10:16 AM Injection made incrementally with aspirations every 5 mL.  Performed by: Personally  Anesthesiologist: Audry Pili, MD  Additional Notes: No pain on injection. No increased resistance to injection. Injection made in 5cc increments. Good needle visualization. Patient tolerated the procedure well.

## 2020-01-23 ENCOUNTER — Encounter: Payer: Self-pay | Admitting: *Deleted

## 2020-01-23 DIAGNOSIS — I2510 Atherosclerotic heart disease of native coronary artery without angina pectoris: Secondary | ICD-10-CM | POA: Diagnosis not present

## 2020-01-23 DIAGNOSIS — I4891 Unspecified atrial fibrillation: Secondary | ICD-10-CM | POA: Diagnosis not present

## 2020-01-23 DIAGNOSIS — N6489 Other specified disorders of breast: Secondary | ICD-10-CM | POA: Diagnosis not present

## 2020-01-23 DIAGNOSIS — C50412 Malignant neoplasm of upper-outer quadrant of left female breast: Secondary | ICD-10-CM | POA: Diagnosis not present

## 2020-01-23 DIAGNOSIS — K219 Gastro-esophageal reflux disease without esophagitis: Secondary | ICD-10-CM | POA: Diagnosis not present

## 2020-01-23 DIAGNOSIS — I10 Essential (primary) hypertension: Secondary | ICD-10-CM | POA: Diagnosis not present

## 2020-01-23 LAB — SURGICAL PATHOLOGY

## 2020-01-23 MED ORDER — TRAMADOL HCL 50 MG PO TABS: 50.0000 mg | ORAL_TABLET | Freq: Four times a day (QID) | ORAL | 0 refills | Status: DC | PRN

## 2020-01-23 MED ORDER — APIXABAN 5 MG PO TABS
5.0000 mg | ORAL_TABLET | Freq: Two times a day (BID) | ORAL | Status: DC
  Administered 2020-01-23: 5 mg via ORAL
  Filled 2020-01-23: qty 1

## 2020-01-23 NOTE — Plan of Care (Signed)
Pt and husband given D/C instructions with verbal understanding. Rx was sent to pharmacy by MD. Pt's incision is clean and dry with no sign of infection. Pt's IV was removed prior to D/C. Pt D/C'd home via wheelchair per MD order. Pt is stable @ D/C and has no other needs at this time. Holli Humbles, RN

## 2020-01-23 NOTE — Discharge Summary (Signed)
Physician Discharge Summary  Patient ID: Adrienne [name] MRN: [id number] DOB/AGE: 84/84/1929 84 y.o.  Admit date: 01/22/2020 Discharge date: 01/23/2020  Admission Diagnoses: Left breast cancer  Discharge Diagnoses:  Active Problems:   Breast cancer, left breast [facility])   Discharged Condition: good  Hospital Course: 12 yof with prior right breast cancer who has tn stage I clinical left breast cancer. We did seed guided lumpectomy with sn biopsy. She tolerated well.  Remained overnight for monitoring, following day she is doing well  Consults: None  Significant Diagnostic Studies: none  Treatments: surgery: left breast seed guided lumpectomy, left ax sn biopsy  Discharge Exam: Blood pressure 126/61, pulse (!) 58, temperature 98.3 F (36.8 C), temperature source Oral, resp. rate 18, height 5' (1.524 m), weight 67 kg, last menstrual period 09/12/1973, SpO2 100 %. Incision/Wound:mild hematoma, no infection or drainage  Disposition: Discharge disposition: 01-Home or Self Care        Allergies as of 01/23/2020      Reactions   Valium [diazepam] Other (See Comments)   HYPERACTIVITY   Amiodarone Hcl [amiodarone] Other (See Comments)   AFIB   Compazine [prochlorperazine] Other (See Comments)   HYPERACTIVITY    Other Other (See Comments)   "Kidney dye"--patient states this gave her severe shakes/chills   Thorazine [chlorpromazine] Other (See Comments)   HYPERACTIVITY    Zometa [zoledronic Acid] Other (See Comments)   PROBLEMS WITH EYES TIA   Ciprofloxacin Swelling   "swelling" in Rt.elbow      Medication List    TAKE these medications   acetaminophen 500 MG tablet Commonly known as: TYLENOL Take 500-1,000 mg by mouth every 12 (twelve) hours as needed (pain).   amoxicillin 500 MG capsule Commonly known as: AMOXIL Take 2,000 mg by mouth See admin instructions. Take 4 capsules (2000 mg) by mouth 1 hour prior to dental appointments.   amoxicillin-clavulanate 500-125 MG  tablet Commonly known as: AUGMENTIN Take 1 tablet by mouth 2 (two) times daily.   atorvastatin 10 MG tablet Commonly known as: LIPITOR TAKE 1 TABLET DAILY What changed:   when to take this  additional instructions   diltiazem 60 MG tablet Commonly known as: CARDIZEM TAKE 1 TABLET THREE TIMES A DAY What changed: when to take this   Eliquis 5 MG Tabs tablet Generic drug: apixaban TAKE 1 TABLET TWICE A DAY What changed: how much to take   furosemide 20 MG tablet Commonly known as: LASIX Take 1 tablet (20 mg total) by mouth daily.   metoprolol tartrate 100 MG tablet Commonly known as: LOPRESSOR TAKE 1 TABLET TWICE A DAY What changed: when to take this   multivitamin with minerals Tabs tablet Take 1 tablet by mouth daily. Centrum for Women 50+   nitrofurantoin 100 MG capsule Commonly known as: MACRODANTIN Take 100 mg by mouth at bedtime.   omeprazole 10 MG capsule Commonly known as: PRILOSEC Take 10 mg by mouth daily before breakfast.   saccharomyces boulardii 250 MG capsule Commonly known as: FLORASTOR Take 250 mg by mouth 2 (two) times daily.   SM CALCIUM CITRATE+D3 PETITE PO Take 1 tablet by mouth in the morning, at noon, and at bedtime.   traMADol 50 MG tablet Commonly known as: ULTRAM Take 1 tablet (50 mg total) by mouth every 6 (six) hours as needed.   vitamin C with rose hips 500 MG tablet Take 500 mg by mouth in the morning and at bedtime.        Signed: Rolm Bookbinder 01/23/2020, 7:39  AM

## 2020-01-23 NOTE — Discharge Instructions (Signed)
[facility] Office Phone Number [phone]  BREAST BIOPSY/ PARTIAL MASTECTOMY: POST OP INSTRUCTIONS Take 400 mg of ibuprofen every 8 hours or 650 mg tylenol every 6 hours for next 72 hours then as needed. Use ice several times daily also. Always review your discharge instruction sheet given to you by the facility where your surgery was performed.  IF YOU HAVE DISABILITY OR FAMILY LEAVE FORMS, YOU MUST BRING THEM TO THE OFFICE FOR PROCESSING.  DO NOT GIVE THEM TO YOUR DOCTOR.  1. A prescription for pain medication may be given to you upon discharge.  Take your pain medication as prescribed, if needed.  If narcotic pain medicine is not needed, then you may take acetaminophen (Tylenol), naprosyn (Alleve) or ibuprofen (Advil) as needed. 2. Take your usually prescribed medications unless otherwise directed 3. If you need a refill on your pain medication, please contact your pharmacy.  They will contact our office to request authorization.  Prescriptions will not be filled after 5pm or on week-ends. 4. You should eat very light the first 24 hours after surgery, such as soup, crackers, pudding, etc.  Resume your normal diet the day after surgery. 5. Most patients will experience some swelling and bruising in the breast.  Ice packs and a good support bra will help.  Wear the breast binder provided or a sports bra for 72 hours day and night.  After that wear a sports bra during the day until you return to the office. Swelling and bruising can take several days to resolve.  6. It is common to experience some constipation if taking pain medication after surgery.  Increasing fluid intake and taking a stool softener will usually help or prevent this problem from occurring.  A mild laxative (Milk of Magnesia or Miralax) should be taken according to package directions if there are no bowel movements after 48 hours. 7. Unless discharge instructions indicate otherwise, you may remove your bandages 48  hours after surgery and you may shower at that time.  You may have steri-strips (small skin tapes) in place directly over the incision.  These strips should be left on the skin for 7-10 days and will come off on their own.  If your surgeon used skin glue on the incision, you may shower in 24 hours.  The glue will flake off over the next 2-3 weeks.  Any sutures or staples will be removed at the office during your follow-up visit. 8. ACTIVITIES:  You may resume regular daily activities (gradually increasing) beginning the next day.  Wearing a good support bra or sports bra minimizes pain and swelling.  You may have sexual intercourse when it is comfortable. a. You may drive when you no longer are taking prescription pain medication, you can comfortably wear a seatbelt, and you can safely maneuver your car and apply brakes. b. RETURN TO WORK:  ______________________________________________________________________________________ 9. You should see your doctor in the office for a follow-up appointment approximately two weeks after your surgery.  Your doctor's nurse will typically make your follow-up appointment when she calls you with your pathology report.  Expect your pathology report 3-4 business days after your surgery.  You may call to check if you do not hear from us after three days. 10. OTHER INSTRUCTIONS: _______________________________________________________________________________________________ _____________________________________________________________________________________________________________________________________ _____________________________________________________________________________________________________________________________________ _____________________________________________________________________________________________________________________________________  WHEN TO CALL DR Addalynn Kumari: 1. Fever over 101.0 2. Nausea and/or vomiting. 3. Extreme swelling or  bruising. 4. Continued bleeding from incision. 5. Increased pain, redness, or drainage from the incision.  The clinic   staff is available to answer your questions during regular business hours.  Please don't hesitate to call and ask to speak to one of the nurses for clinical concerns.  If you have a medical emergency, go to the nearest emergency room or call 911.  A surgeon from [facility] is always on call at the hospital.  For further questions, please visit [email or web address]  

## 2020-01-27 ENCOUNTER — Encounter: Payer: Self-pay | Admitting: *Deleted

## 2020-01-28 ENCOUNTER — Ambulatory Visit: Payer: Medicare Other | Admitting: Obstetrics and Gynecology

## 2020-02-07 NOTE — Progress Notes (Signed)
Error

## 2020-02-12 ENCOUNTER — Encounter: Payer: Self-pay | Admitting: *Deleted

## 2020-02-12 ENCOUNTER — Other Ambulatory Visit: Payer: Self-pay

## 2020-02-12 ENCOUNTER — Ambulatory Visit
Admission: RE | Admit: 2020-02-12 | Discharge: 2020-02-12 | Disposition: A | Discharge status: Home / Self Care | Payer: Medicare Other | Source: Ambulatory Visit | Attending: Radiation Oncology | Admitting: Radiation Oncology

## 2020-02-12 ENCOUNTER — Encounter: Payer: Self-pay | Admitting: Radiation Oncology

## 2020-02-12 VITALS — BP 131/60 | HR 73 | Temp 97.7°F | Resp 20 | Ht 60.0 in | Wt 152.4 lb

## 2020-02-12 DIAGNOSIS — Z171 Estrogen receptor negative status [ER-]: Secondary | ICD-10-CM

## 2020-02-12 DIAGNOSIS — C50212 Malignant neoplasm of upper-inner quadrant of left female breast: Secondary | ICD-10-CM | POA: Diagnosis not present

## 2020-02-12 DIAGNOSIS — Z79899 Other long term (current) drug therapy: Secondary | ICD-10-CM | POA: Insufficient documentation

## 2020-02-12 DIAGNOSIS — C50012 Malignant neoplasm of nipple and areola, left female breast: Secondary | ICD-10-CM

## 2020-02-12 DIAGNOSIS — Z9889 Other specified postprocedural states: Secondary | ICD-10-CM | POA: Diagnosis not present

## 2020-02-12 DIAGNOSIS — Z510 Encounter for antineoplastic radiation therapy: Secondary | ICD-10-CM | POA: Insufficient documentation

## 2020-02-12 NOTE — Progress Notes (Signed)
Patient here for a f/u new visit with Dr. Roselind Messier. Patient last seen on 01/07/20. Had left breast lumpectomy 01/22/20. She reports pain at a level 3 in her left breast.  BP 131/60 (BP Location: Left Leg, Patient Position: Sitting, Cuff Size: Normal)   Pulse 73   Temp 97.7 F (36.5 C)   Resp 20   Ht 5' (1.524 m)   Wt 152 lb 6.4 oz (69.1 kg)   LMP 09/12/1973 (Approximate)   SpO2 98%   BMI 29.76 kg/m    Wt Readings from Last 3 Encounters:  02/12/20 152 lb 6.4 oz (69.1 kg)  01/22/20 147 lb 11.3 oz (67 kg)  01/17/20 147 lb 9.6 oz (67 kg)

## 2020-02-12 NOTE — Progress Notes (Signed)
Radiation Oncology         [phone]) [phone] ________________________________  Name: Adrienne [name] MRN: [id number]  Date: 02/12/2020  DOB: [birth year]  Re-Evaluation Note  CC: Kathyrn Lass, MD  Rolm Bookbinder, MD    ICD-10-CM   1. Malignant neoplasm of upper-inner quadrant of left breast in female, estrogen receptor negative ([place])  C50.212    Z17.1   2. Malignant neoplasm of nipple of left breast in female, unspecified estrogen receptor status ([place])  C50.012   3. Malignant neoplasm of areola of left breast in female, unspecified estrogen receptor status ([place])  C50.012     Diagnosis: Stage pT1a, pN0 Left Breast UIQ, Invasive Ductal Carcinoma with DCIS, ER- / PR- / Her2-, Grade 3  Narrative: The patient returns today to discuss radiation treatment options. She was seen in consultation on 01/07/2020. At that time, it was recommended that the patient proceed with surgical intervention, whether that be lumpectomy or mastectomy, followed by hypo-fractionated accelerated radiation therapy over four weeks assuming lymph nodes were negative. The patient and her husband were somewhat hesitant about considering a mastectomy given her chronic anticoagulation and fear of possible stroke if she was to be taken off the medication.   Since consultation, the patient met with Dr. Jana Hakim on 01/17/2020. The patient was noted to have opted against adjuvant chemotherapy at that time. However, if the tumor was to recur systemically, the only option would be to have chemotherapy, which the patient understood.  Of note, the patient had a tele-visit with Dr. Valeta Harms on 01/20/2020 to follow-up on her ongoing cough and multiple pulmonary nodules concerning for underling MAI. Dr. Valeta Harms noted that at her current age and with the scattered inflammatory appearing lung nodules, a bronchoscopy or any other invasive tissue diagnosis to figure out if she has an underlying chronic infection such as MAI would likely not  change management. She would also likely not tolerate the treatments for potential underlying MAI. He recommended focusing on her breast cancer and having a follow-up with chest CT in approximately nine months.  The patient opted to proceed with left breast lumpectomy with deep left axillary sentinel node biopsy on 01/22/2020 performed by Dr. Donne Hazel. Pathology from the procedure revealed invasive ductal carcinoma with ductal carcinoma in-situ and some fibrocystic changes with usual ductal hyperplasia and calcifications of the left breast. Left additional medial margin, lateral margin, superior margin, posterior margin, and inferior margin all showed fibrocystic changes with usual ductal hyperplasia and calcifications without malignancy. Of note, there was also some sclerosing adenosis with calcifications identified in the left additional lateral margin. Two left axillary sentinel lymph nodes were biopsied and were both negative for metastatic carcinoma.  On review of systems, the patient reports left breast pain rated 3/10. She denies left arm pain or swelling in the left arm and any other symptoms.  sHe does report feeling a lump within the left axillary surgical bed.  Patient developed hematoma within the breast as expected given the necessity for rapid reinstitution of anticoagulation given the patient's medical history and high risk for CVA.   Allergies:  is allergic to valium [diazepam]; amiodarone hcl [amiodarone]; compazine [prochlorperazine]; other; thorazine [chlorpromazine]; zometa [zoledronic acid]; and ciprofloxacin.  Meds: Current Outpatient Medications  Medication Sig Dispense Refill  . acetaminophen (TYLENOL) 500 MG tablet Take 500-1,000 mg by mouth every 12 (twelve) hours as needed (pain).     Marland Kitchen amoxicillin (AMOXIL) 500 MG capsule Take 2,000 mg by mouth See admin instructions. Take 4 capsules (2000 mg)  by mouth 1 hour prior to dental appointments.    . Ascorbic Acid (VITAMIN C WITH  ROSE HIPS) 500 MG tablet Take 500 mg by mouth in the morning and at bedtime.    Marland Kitchen atorvastatin (LIPITOR) 10 MG tablet TAKE 1 TABLET DAILY (Patient taking differently: Take 10 mg by mouth daily in the afternoon. (1600)) 90 tablet 3  . Calcium Citrate-Vitamin D (SM CALCIUM CITRATE+D3 PETITE PO) Take 1 tablet by mouth in the morning, at noon, and at bedtime.    . cefdinir (OMNICEF) 300 MG capsule Take 300 mg by mouth 2 (two) times daily.    Marland Kitchen diltiazem (CARDIZEM) 60 MG tablet TAKE 1 TABLET THREE TIMES A DAY (Patient taking differently: Take 60 mg by mouth in the morning, at noon, and at bedtime. ) 270 tablet 3  . ELIQUIS 5 MG TABS tablet TAKE 1 TABLET TWICE A DAY (Patient taking differently: Take 5 mg by mouth 2 (two) times daily. ) 180 tablet 1  . furosemide (LASIX) 20 MG tablet Take 1 tablet (20 mg total) by mouth daily. 90 tablet 3  . metoprolol tartrate (LOPRESSOR) 100 MG tablet TAKE 1 TABLET TWICE A DAY (Patient taking differently: Take 100 mg by mouth in the morning and at bedtime. ) 180 tablet 3  . Multiple Vitamin (MULTIVITAMIN WITH MINERALS) TABS tablet Take 1 tablet by mouth daily. Centrum for Women 50+    . nitrofurantoin (MACRODANTIN) 100 MG capsule Take 100 mg by mouth at bedtime.     Marland Kitchen omeprazole (PRILOSEC) 10 MG capsule Take 10 mg by mouth daily before breakfast.     . saccharomyces boulardii (FLORASTOR) 250 MG capsule Take 250 mg by mouth 2 (two) times daily.    . traMADol (ULTRAM) 50 MG tablet Take 1 tablet (50 mg total) by mouth every 6 (six) hours as needed. 10 tablet 0  . amoxicillin-clavulanate (AUGMENTIN) 500-125 MG tablet Take 1 tablet by mouth 2 (two) times daily.     No current facility-administered medications for this encounter.    Physical Findings: The patient is in no acute distress. Patient is alert and oriented.  height is 5' (1.524 m) and weight is 152 lb 6.4 oz (69.1 kg). Her temperature is 97.7 F (36.5 C). Her blood pressure is 131/60 and her pulse is 73. Her  respiration is 20 and oxygen saturation is 98%.  No significant changes. Lungs are clear to auscultation bilaterally. Heart has regular rate and rhythm. No palpable cervical, supraclavicular, or axillary adenopathy. Abdomen soft, non-tender, normal bowel sounds. Right breast: no palpable mass, nipple discharge or bleeding. Left breast: Patient has extensive bruising within the left breast related to her need for anticoagulation.  She has 4 Steri-Strips remaining in the surgical bed.  No signs of drainage or infection.  Patient has a separate scar in the axilla from her sentinel node procedure.  This area is also healing well.  She may have a seroma/hematoma in the axillary region measuring approximately 2-1/2 x 3 cm.     Lab Findings: Lab Results  Component Value Date   WBC 12.1 (H) 01/14/2020   HGB 13.1 01/14/2020   HCT 41.5 01/14/2020   MCV 95.2 01/14/2020   PLT 245 01/14/2020    Radiographic Findings: NM Sentinel Node Inj-No Rpt (Breast)  Result Date: 01/22/2020 Sulfur colloid was injected by the nuclear medicine technologist for melanoma sentinel node.   MM Breast Surgical Specimen  Result Date: 01/22/2020 CLINICAL DATA:  Status post radioactive seed localized lumpectomy of the  LEFT breast. EXAM: SPECIMEN RADIOGRAPH OF THE LEFT BREAST COMPARISON:  01/21/2020 and earlier FINDINGS: Status post excision of the left breast. The radioactive seed and ribbon shaped biopsy marker clip are present, completely intact, and were marked for pathology. IMPRESSION: Specimen radiograph of the LEFT breast. Electronically Signed   By: Nolon Nations M.D.   On: 01/22/2020 11:20   MM LT RADIOACTIVE SEED LOC MAMMO GUIDE  Result Date: 01/21/2020 CLINICAL DATA:  Localization of a left breast mass EXAM: MAMMOGRAPHIC GUIDED RADIOACTIVE SEED LOCALIZATION OF THE LEFT BREAST COMPARISON:  Previous exam(s). FINDINGS: Patient presents for radioactive seed localization prior to surgery. I met with the patient and we  discussed the procedure of seed localization including benefits and alternatives. We discussed the high likelihood of a successful procedure. We discussed the risks of the procedure including infection, bleeding, tissue injury and further surgery. We discussed the low dose of radioactivity involved in the procedure. Informed, written consent was given. The usual time-out protocol was performed immediately prior to the procedure. Using mammographic guidance, sterile technique, 1% lidocaine and an I-125 radioactive seed, the left biopsy clip was localized using a medial approach. The follow-up mammogram images confirm the seed in the expected location and were marked for the surgeon. Follow-up survey of the patient confirms presence of the radioactive seed. Order number of I-125 seed:  [id number]. Total activity:  8.003 millicuries reference Date: January 08, 2020 The patient tolerated the procedure well and was released from the [facility]. She was given instructions regarding seed removal. IMPRESSION: Radioactive seed localization left breast. No apparent complications. Electronically Signed   By: Dorise Bullion III M.D   On: 01/21/2020 11:40    Impression:  Stage pT1a, pN0 Left Breast UIQ, Invasive Ductal Carcinoma with DCIS, ER- / PR- / Her2-, Grade 3  At the time of surgery the patient was found to have a small invasive ductal breast cancer extending over approximately 3 mm.  The intraductal component measured 1.5 cm.  The invasive ductal breast cancer however was aggressive with a Ki-67 of 90%, grade 3 and triple negative.  Despite the patient's advanced age given these pathologic findings I would recommend adjuvant radiation therapy directed at the left breast.  Patient's surgical margins were all greater than 1 cm and therefore patient would be a candidate for hypofractionated accelerated radiation therapy over 3 weeks without the need for a boost field.  Plan:  Patient is scheduled for CT simulation  on June 14.  This will allow for some resolution of the bruising noted within the breast. If She continues to have significant bruising at that time we may need to consider delaying her simulation an additional 1 to 2 weeks.  -----------------------------------  Blair Promise, PhD, MD  This document serves as a record of services personally performed by Gery Pray, MD. It was created on his behalf by Clerance Lav, a trained medical scribe. The creation of this record is based on the scribe's personal observations and the provider's statements to them. This document has been checked and approved by the attending provider.

## 2020-02-18 ENCOUNTER — Ambulatory Visit: Payer: Medicare Other | Admitting: Obstetrics and Gynecology

## 2020-02-18 NOTE — Progress Notes (Signed)
GYNECOLOGY  VISIT   HPI: 86 y.o.   Married  Caucasian  female   [phone] with Patient's last menstrual period was 09/12/1973 (approximate).   here for pessary check.   She is having some red spotting most of the time and it comes and goes.  No pain.  No problems with voiding and BMs. Just treated for a UTI, Cefdinir, through on call urologist during [facility] Day weekend.  She just finished it yesterday.  She is also on maintenance Macrobid 100 mg daily.  Recent vaginal biopsy on 10/29/19 and she had benign granulation tissue.   Small pessaries were difficult for her to maintain.  She has difficulty emptying her bladder when she uses a pessary.  Just had lumpectomy of left breast for cancer. Negative lymph nodes.  She will start radiation therapy.  She had a prior right mastectomy.   GYNECOLOGIC HISTORY: Patient's last menstrual period was 09/12/1973 (approximate). Contraception: PMP Menopausal hormone therapy: none Last mammogram: 01-22-20 [facility]--see Epic (dx'd with [facility]) Last pap smear:  03-11-19 Neg,11-25-15 Neg        OB History    Gravida  3   Para  3   Term  3   Preterm      AB      Living  3     SAB      TAB      Ectopic      Multiple      Live Births                 Patient Active Problem List   Diagnosis Date Noted  . Breast cancer, left breast ([place]) 01/22/2020  . Malignant neoplasm of upper-inner quadrant of left breast in female, estrogen receptor negative ([place]) 12/20/2019  . Malignant neoplasm of overlapping sites of right breast in female, estrogen receptor positive ([place]) 08/14/2019  . Osteopenia 08/14/2019  . Diplopia 01/29/2016  . GERD (gastroesophageal reflux disease) 01/29/2016  . Cerebrovascular accident (CVA) due to embolism of left middle cerebral artery ([place]) 01/29/2016  . Persistent atrial fibrillation ([place])   . Bleeding gastrointestinal   . Gait disturbance, post-stroke 01/27/2016  . Fall   . Secondary hypertension,  unspecified   . Cerebral infarction due to thrombosis of left middle cerebral artery (HCC) s/p mechanical thrombectomy   . Malnutrition of moderate degree 01/09/2016  . GI bleed 01/06/2016  . Chronic atrial fibrillation ([place]) 06/10/2015  . Chronic anticoagulation 06/10/2015  . Hyperlipidemia 06/10/2015  . Essential hypertension 06/10/2015  . History of stroke 06/10/2015  . Coronary artery disease due to lipid rich plaque 06/10/2015  . Afib ([place])   . Elevated uric acid in blood   . Arthritis of ankle   . Pulmonary HTN ([place])     Past Medical History:  Diagnosis Date  . Afib ([place])   . Arthritis of ankle   . CAD (coronary artery disease)   . Cancer [facility]) 2012   breast cancer-right  . Compression fracture of T12 vertebra (HCC)   . Diverticulosis   . Edema   . Elevated uric acid in blood   . GERD (gastroesophageal reflux disease)   . GI bleed 12/2015  . Hyperlipidemia   . Hypertension   . Long-term (current) use of anticoagulants   . Moderate mitral regurgitation   . Pulmonary HTN ([place])    Echo 1/19: EF 60-65, no RWMA, Ao sclerosis, trivial AI, mild MR, mod BAE, mod TR, PASP 49  . Stroke ([place]) 01/25/2016  . TIA (transient  ischemic attack) 09/2011  . Urinary incontinence   . Urinary retention     Past Surgical History:  Procedure Laterality Date  . APPENDECTOMY    . AXILLARY SENTINEL NODE BIOPSY Left 01/22/2020   Procedure: Left Axillary Sentinel Node Biopsy;  Surgeon: Rolm Bookbinder, MD;  Location: [facility];  Service: General;  Laterality: Left;  . BREAST LUMPECTOMY WITH RADIOACTIVE SEED AND SENTINEL LYMPH NODE BIOPSY Left 01/22/2020   Procedure: LEFT BREAST LUMPECTOMY WITH RADIOACTIVE SEED;  Surgeon: Rolm Bookbinder, MD;  Location: [facility];  Service: General;  Laterality: Left;  . BREAST SURGERY  2012   Rt.mastectomy--[place], [place]  2013  . CATARACT EXTRACTION, BILATERAL    . DILATION AND CURETTAGE OF UTERUS     in her early 95's for DUB  .  ESOPHAGOGASTRODUODENOSCOPY Left 01/08/2016   Procedure: ESOPHAGOGASTRODUODENOSCOPY (EGD);  Surgeon: Arta Silence, MD;  Location: [facility];  Service: Endoscopy;  Laterality: Left;  . GALLBLADDER SURGERY    . MASTECTOMY Right   . RADIOLOGY WITH ANESTHESIA N/A 01/25/2016   Procedure: RADIOLOGY WITH ANESTHESIA;  Surgeon: Luanne Bras, MD;  Location: [facility];  Service: Radiology;  Laterality: N/A;  . REPLACEMENT TOTAL KNEE BILATERAL    . TONSILLECTOMY    . TONSILLECTOMY AND ADENOIDECTOMY     as a child    Current Outpatient Medications  Medication Sig Dispense Refill  . acetaminophen (TYLENOL) 500 MG tablet Take 500-1,000 mg by mouth every 12 (twelve) hours as needed (pain).     Marland Kitchen amoxicillin (AMOXIL) 500 MG capsule Take 2,000 mg by mouth See admin instructions. Take 4 capsules (2000 mg) by mouth 1 hour prior to dental appointments.    Marland Kitchen amoxicillin-clavulanate (AUGMENTIN) 500-125 MG tablet Take 1 tablet by mouth 2 (two) times daily.    . Ascorbic Acid (VITAMIN C WITH ROSE HIPS) 500 MG tablet Take 500 mg by mouth in the morning and at bedtime.    Marland Kitchen atorvastatin (LIPITOR) 10 MG tablet TAKE 1 TABLET DAILY (Patient taking differently: Take 10 mg by mouth daily in the afternoon. (1600)) 90 tablet 3  . Calcium Citrate-Vitamin D (SM CALCIUM CITRATE+D3 PETITE PO) Take 1 tablet by mouth in the morning, at noon, and at bedtime.    . cefdinir (OMNICEF) 300 MG capsule Take 300 mg by mouth 2 (two) times daily.    Marland Kitchen diltiazem (CARDIZEM) 60 MG tablet TAKE 1 TABLET THREE TIMES A DAY (Patient taking differently: Take 60 mg by mouth in the morning, at noon, and at bedtime. ) 270 tablet 3  . ELIQUIS 5 MG TABS tablet TAKE 1 TABLET TWICE A DAY (Patient taking differently: Take 5 mg by mouth 2 (two) times daily. ) 180 tablet 1  . furosemide (LASIX) 20 MG tablet Take 1 tablet (20 mg total) by mouth daily. 90 tablet 3  . metoprolol tartrate (LOPRESSOR) 100 MG tablet TAKE 1 TABLET TWICE A DAY (Patient taking  differently: Take 100 mg by mouth in the morning and at bedtime. ) 180 tablet 3  . Multiple Vitamin (MULTIVITAMIN WITH MINERALS) TABS tablet Take 1 tablet by mouth daily. Centrum for Women 50+    . nitrofurantoin (MACRODANTIN) 100 MG capsule Take 100 mg by mouth at bedtime.     Marland Kitchen omeprazole (PRILOSEC) 10 MG capsule Take 10 mg by mouth daily before breakfast.     . saccharomyces boulardii (FLORASTOR) 250 MG capsule Take 250 mg by mouth 2 (two) times daily.    . traMADol (ULTRAM) 50 MG tablet  Take 1 tablet (50 mg total) by mouth every 6 (six) hours as needed. 10 tablet 0   No current facility-administered medications for this visit.     ALLERGIES: Valium [diazepam], Amiodarone hcl [amiodarone], Compazine [prochlorperazine], Other, Thorazine [chlorpromazine], Zometa [zoledronic acid], and Ciprofloxacin  Family History  Problem Relation Age of Onset  . Asthma Mother   . CVA Father   . Atrial fibrillation Sister        HAS PACER  . Pneumonia Brother   . Cancer Daughter        COLON CANCER  . Cancer Maternal Grandmother        breast cancer    Social History   Socioeconomic History  . Marital status: Married    Spouse name: Not on file  . Number of children: Not on file  . Years of education: Not on file  . Highest education level: Not on file  Occupational History  . Not on file  Tobacco Use  . Smoking status: Former Smoker    Packs/day: 0.25    Years: 2.00    Pack years: 0.50    Types: Cigarettes    Quit date: 1950    Years since quitting: 71.4  . Smokeless tobacco: Never Used  Substance and Sexual Activity  . Alcohol use: No    Alcohol/week: 0.0 standard drinks  . Drug use: No  . Sexual activity: Yes    Partners: Male    Birth control/protection: Post-menopausal  Other Topics Concern  . Not on file  Social History Narrative  . Not on file   Social Determinants of Health   Financial Resource Strain:   . Difficulty of Paying Living Expenses:   Food Insecurity:    . Worried About Charity fundraiser in the Last Year:   . Arboriculturist in the Last Year:   Transportation Needs:   . Film/video editor (Medical):   Marland Kitchen Lack of Transportation (Non-Medical):   Physical Activity:   . Days of Exercise per Week:   . Minutes of Exercise per Session:   Stress:   . Feeling of Stress :   Social Connections:   . Frequency of Communication with Friends and Family:   . Frequency of Social Gatherings with Friends and Family:   . Attends Religious Services:   . Active Member of Clubs or Organizations:   . Attends Archivist Meetings:   Marland Kitchen Marital Status:   Intimate Partner Violence:   . Fear of Current or Ex-Partner:   . Emotionally Abused:   Marland Kitchen Physically Abused:   . Sexually Abused:     Review of Systems  All other systems reviewed and are negative.   PHYSICAL EXAMINATION:    Pulse 70 Comment: irregular--pt w/A.fib  Temp (!) 97.5 F (36.4 C) (Temporal)   Ht 5' 0.5" (1.537 m)   Wt 154 lb (69.9 kg)   LMP 09/12/1973 (Approximate)   BMI 29.58 kg/m     General appearance: alert, cooperative and appears stated age  Pelvic: External genitalia:  no lesions              Urethra:  normal appearing urethra with no masses, tenderness or lesions              Bartholins and Skenes: normal                 Vagina: left apical granulation tissue and ulceration  Cervix: no lesions                Bimanual Exam:  Uterus:  normal size, contour, position, consistency, mobility, non-tender              Adnexa: no mass, fullness, tenderness           Chaperone was present for exam.  ASSESSMENT  Complete uterovaginal prolapse.  Vaginal granulation tissue.  Recurrent UTI on prophylaxis. Left breast cancer.  On Eliquis for hx stroke.   PLAN  Ok to continue pessary use and return in 6 weeks.  We will not try a smaller pessary.  She will FU with urology for recheck appointment.  Support given for new dx of left breast cancer.     An After Visit Summary was printed and given to the patient.  ___20___ minutes face to face time of which over 50% was spent in counseling.

## 2020-02-19 ENCOUNTER — Ambulatory Visit: Payer: Medicare Other | Admitting: Obstetrics and Gynecology

## 2020-02-19 ENCOUNTER — Other Ambulatory Visit: Payer: Self-pay

## 2020-02-19 ENCOUNTER — Encounter: Payer: Self-pay | Admitting: Obstetrics and Gynecology

## 2020-02-19 ENCOUNTER — Ambulatory Visit (INDEPENDENT_AMBULATORY_CARE_PROVIDER_SITE_OTHER): Payer: Medicare Other | Admitting: Obstetrics and Gynecology

## 2020-02-19 VITALS — HR 70 | Temp 97.5°F | Ht 60.5 in | Wt 154.0 lb

## 2020-02-19 DIAGNOSIS — N813 Complete uterovaginal prolapse: Secondary | ICD-10-CM | POA: Diagnosis not present

## 2020-02-19 DIAGNOSIS — I63412 Cerebral infarction due to embolism of left middle cerebral artery: Secondary | ICD-10-CM | POA: Diagnosis not present

## 2020-02-19 DIAGNOSIS — N898 Other specified noninflammatory disorders of vagina: Secondary | ICD-10-CM | POA: Diagnosis not present

## 2020-02-24 ENCOUNTER — Ambulatory Visit
Admission: RE | Admit: 2020-02-24 | Discharge: 2020-02-24 | Disposition: A | Discharge status: Home / Self Care | Payer: Medicare Other | Source: Ambulatory Visit | Attending: Radiation Oncology | Admitting: Radiation Oncology

## 2020-02-24 ENCOUNTER — Other Ambulatory Visit: Payer: Self-pay

## 2020-02-24 DIAGNOSIS — C50212 Malignant neoplasm of upper-inner quadrant of left female breast: Secondary | ICD-10-CM | POA: Diagnosis not present

## 2020-02-24 DIAGNOSIS — Z171 Estrogen receptor negative status [ER-]: Secondary | ICD-10-CM | POA: Diagnosis not present

## 2020-02-24 DIAGNOSIS — Z510 Encounter for antineoplastic radiation therapy: Secondary | ICD-10-CM | POA: Diagnosis present

## 2020-02-25 ENCOUNTER — Other Ambulatory Visit: Payer: Medicare Other

## 2020-02-25 ENCOUNTER — Ambulatory Visit: Payer: Medicare Other | Admitting: Oncology

## 2020-02-25 ENCOUNTER — Encounter: Payer: Self-pay | Admitting: *Deleted

## 2020-02-26 DIAGNOSIS — Z510 Encounter for antineoplastic radiation therapy: Secondary | ICD-10-CM | POA: Diagnosis not present

## 2020-02-26 DIAGNOSIS — C50212 Malignant neoplasm of upper-inner quadrant of left female breast: Secondary | ICD-10-CM | POA: Diagnosis not present

## 2020-03-02 ENCOUNTER — Other Ambulatory Visit: Payer: Self-pay

## 2020-03-02 ENCOUNTER — Ambulatory Visit
Admission: RE | Admit: 2020-03-02 | Discharge: 2020-03-02 | Disposition: A | Discharge status: Home / Self Care | Payer: Medicare Other | Source: Ambulatory Visit | Attending: Radiation Oncology | Admitting: Radiation Oncology

## 2020-03-02 DIAGNOSIS — Z171 Estrogen receptor negative status [ER-]: Secondary | ICD-10-CM

## 2020-03-02 DIAGNOSIS — Z510 Encounter for antineoplastic radiation therapy: Secondary | ICD-10-CM | POA: Diagnosis not present

## 2020-03-02 DIAGNOSIS — C50212 Malignant neoplasm of upper-inner quadrant of left female breast: Secondary | ICD-10-CM | POA: Diagnosis not present

## 2020-03-02 MED ORDER — ALRA NON-METALLIC DEODORANT (RAD-ONC)
1.0000 "application " | Freq: Once | TOPICAL | Status: AC
  Administered 2020-03-02: 1 via TOPICAL

## 2020-03-02 MED ORDER — SONAFINE EX EMUL
1.0000 "application " | Freq: Two times a day (BID) | CUTANEOUS | Status: DC
  Administered 2020-03-02: 1 via TOPICAL

## 2020-03-03 ENCOUNTER — Ambulatory Visit
Admission: RE | Admit: 2020-03-03 | Discharge: 2020-03-03 | Disposition: A | Discharge status: Home / Self Care | Payer: Medicare Other | Source: Ambulatory Visit | Attending: Radiation Oncology | Admitting: Radiation Oncology

## 2020-03-03 ENCOUNTER — Other Ambulatory Visit: Payer: Self-pay

## 2020-03-03 DIAGNOSIS — Z510 Encounter for antineoplastic radiation therapy: Secondary | ICD-10-CM | POA: Diagnosis not present

## 2020-03-03 DIAGNOSIS — C50212 Malignant neoplasm of upper-inner quadrant of left female breast: Secondary | ICD-10-CM | POA: Diagnosis not present

## 2020-03-04 ENCOUNTER — Ambulatory Visit
Admission: RE | Admit: 2020-03-04 | Discharge: 2020-03-04 | Disposition: A | Discharge status: Home / Self Care | Payer: Medicare Other | Source: Ambulatory Visit | Attending: Radiation Oncology | Admitting: Radiation Oncology

## 2020-03-04 DIAGNOSIS — C50212 Malignant neoplasm of upper-inner quadrant of left female breast: Secondary | ICD-10-CM | POA: Diagnosis not present

## 2020-03-04 DIAGNOSIS — Z510 Encounter for antineoplastic radiation therapy: Secondary | ICD-10-CM | POA: Diagnosis not present

## 2020-03-05 ENCOUNTER — Ambulatory Visit
Admission: RE | Admit: 2020-03-05 | Discharge: 2020-03-05 | Disposition: A | Discharge status: Home / Self Care | Payer: Medicare Other | Source: Ambulatory Visit | Attending: Radiation Oncology | Admitting: Radiation Oncology

## 2020-03-05 ENCOUNTER — Other Ambulatory Visit: Payer: Self-pay

## 2020-03-05 DIAGNOSIS — Z510 Encounter for antineoplastic radiation therapy: Secondary | ICD-10-CM | POA: Diagnosis not present

## 2020-03-05 DIAGNOSIS — C50212 Malignant neoplasm of upper-inner quadrant of left female breast: Secondary | ICD-10-CM | POA: Diagnosis not present

## 2020-03-06 ENCOUNTER — Other Ambulatory Visit: Payer: Self-pay

## 2020-03-06 ENCOUNTER — Ambulatory Visit
Admission: RE | Admit: 2020-03-06 | Discharge: 2020-03-06 | Disposition: A | Discharge status: Home / Self Care | Payer: Medicare Other | Source: Ambulatory Visit | Attending: Radiation Oncology | Admitting: Radiation Oncology

## 2020-03-06 DIAGNOSIS — Z510 Encounter for antineoplastic radiation therapy: Secondary | ICD-10-CM | POA: Diagnosis not present

## 2020-03-06 DIAGNOSIS — C50212 Malignant neoplasm of upper-inner quadrant of left female breast: Secondary | ICD-10-CM | POA: Diagnosis not present

## 2020-03-09 ENCOUNTER — Ambulatory Visit
Admission: RE | Admit: 2020-03-09 | Discharge: 2020-03-09 | Disposition: A | Discharge status: Home / Self Care | Payer: Medicare Other | Source: Ambulatory Visit | Attending: Radiation Oncology | Admitting: Radiation Oncology

## 2020-03-09 ENCOUNTER — Other Ambulatory Visit: Payer: Self-pay

## 2020-03-09 DIAGNOSIS — C50212 Malignant neoplasm of upper-inner quadrant of left female breast: Secondary | ICD-10-CM | POA: Diagnosis not present

## 2020-03-09 DIAGNOSIS — Z510 Encounter for antineoplastic radiation therapy: Secondary | ICD-10-CM | POA: Diagnosis not present

## 2020-03-10 ENCOUNTER — Ambulatory Visit
Admission: RE | Admit: 2020-03-10 | Discharge: 2020-03-10 | Disposition: A | Discharge status: Home / Self Care | Payer: Medicare Other | Source: Ambulatory Visit | Attending: Radiation Oncology | Admitting: Radiation Oncology

## 2020-03-10 ENCOUNTER — Other Ambulatory Visit: Payer: Self-pay

## 2020-03-10 DIAGNOSIS — C50212 Malignant neoplasm of upper-inner quadrant of left female breast: Secondary | ICD-10-CM | POA: Diagnosis not present

## 2020-03-10 DIAGNOSIS — C50012 Malignant neoplasm of nipple and areola, left female breast: Secondary | ICD-10-CM

## 2020-03-10 DIAGNOSIS — Z510 Encounter for antineoplastic radiation therapy: Secondary | ICD-10-CM | POA: Diagnosis not present

## 2020-03-10 MED ORDER — SONAFINE EX EMUL
1.0000 "application " | Freq: Two times a day (BID) | CUTANEOUS | Status: DC
  Administered 2020-03-10: 1 via TOPICAL

## 2020-03-11 ENCOUNTER — Other Ambulatory Visit: Payer: Self-pay

## 2020-03-11 ENCOUNTER — Ambulatory Visit
Admission: RE | Admit: 2020-03-11 | Discharge: 2020-03-11 | Disposition: A | Discharge status: Home / Self Care | Payer: Medicare Other | Source: Ambulatory Visit | Attending: Radiation Oncology | Admitting: Radiation Oncology

## 2020-03-11 DIAGNOSIS — Z510 Encounter for antineoplastic radiation therapy: Secondary | ICD-10-CM | POA: Diagnosis not present

## 2020-03-11 DIAGNOSIS — C50212 Malignant neoplasm of upper-inner quadrant of left female breast: Secondary | ICD-10-CM | POA: Diagnosis not present

## 2020-03-12 ENCOUNTER — Other Ambulatory Visit: Payer: Self-pay

## 2020-03-12 ENCOUNTER — Ambulatory Visit
Admission: RE | Admit: 2020-03-12 | Discharge: 2020-03-12 | Disposition: A | Discharge status: Home / Self Care | Payer: Medicare Other | Source: Ambulatory Visit | Attending: Radiation Oncology | Admitting: Radiation Oncology

## 2020-03-12 DIAGNOSIS — C50012 Malignant neoplasm of nipple and areola, left female breast: Secondary | ICD-10-CM | POA: Diagnosis not present

## 2020-03-12 DIAGNOSIS — Z171 Estrogen receptor negative status [ER-]: Secondary | ICD-10-CM | POA: Insufficient documentation

## 2020-03-12 DIAGNOSIS — Z510 Encounter for antineoplastic radiation therapy: Secondary | ICD-10-CM | POA: Insufficient documentation

## 2020-03-12 DIAGNOSIS — C50212 Malignant neoplasm of upper-inner quadrant of left female breast: Secondary | ICD-10-CM | POA: Diagnosis not present

## 2020-03-13 ENCOUNTER — Ambulatory Visit
Admission: RE | Admit: 2020-03-13 | Discharge: 2020-03-13 | Disposition: A | Discharge status: Home / Self Care | Payer: Medicare Other | Source: Ambulatory Visit | Attending: Radiation Oncology | Admitting: Radiation Oncology

## 2020-03-13 ENCOUNTER — Other Ambulatory Visit: Payer: Self-pay

## 2020-03-13 DIAGNOSIS — C50212 Malignant neoplasm of upper-inner quadrant of left female breast: Secondary | ICD-10-CM | POA: Diagnosis not present

## 2020-03-13 DIAGNOSIS — Z171 Estrogen receptor negative status [ER-]: Secondary | ICD-10-CM | POA: Diagnosis not present

## 2020-03-13 DIAGNOSIS — Z510 Encounter for antineoplastic radiation therapy: Secondary | ICD-10-CM | POA: Diagnosis not present

## 2020-03-13 DIAGNOSIS — C50012 Malignant neoplasm of nipple and areola, left female breast: Secondary | ICD-10-CM | POA: Diagnosis not present

## 2020-03-17 ENCOUNTER — Other Ambulatory Visit: Payer: Self-pay

## 2020-03-17 ENCOUNTER — Ambulatory Visit: Payer: Medicare Other | Admitting: Radiation Oncology

## 2020-03-17 ENCOUNTER — Ambulatory Visit
Admission: RE | Admit: 2020-03-17 | Discharge: 2020-03-17 | Disposition: A | Discharge status: Home / Self Care | Payer: Medicare Other | Source: Ambulatory Visit | Attending: Radiation Oncology | Admitting: Radiation Oncology

## 2020-03-17 DIAGNOSIS — C50012 Malignant neoplasm of nipple and areola, left female breast: Secondary | ICD-10-CM | POA: Diagnosis not present

## 2020-03-17 DIAGNOSIS — C50212 Malignant neoplasm of upper-inner quadrant of left female breast: Secondary | ICD-10-CM | POA: Diagnosis not present

## 2020-03-17 DIAGNOSIS — Z171 Estrogen receptor negative status [ER-]: Secondary | ICD-10-CM | POA: Diagnosis not present

## 2020-03-17 DIAGNOSIS — Z510 Encounter for antineoplastic radiation therapy: Secondary | ICD-10-CM | POA: Diagnosis not present

## 2020-03-18 ENCOUNTER — Other Ambulatory Visit: Payer: Self-pay

## 2020-03-18 ENCOUNTER — Ambulatory Visit
Admission: RE | Admit: 2020-03-18 | Discharge: 2020-03-18 | Disposition: A | Discharge status: Home / Self Care | Payer: Medicare Other | Source: Ambulatory Visit | Attending: Radiation Oncology | Admitting: Radiation Oncology

## 2020-03-18 DIAGNOSIS — Z510 Encounter for antineoplastic radiation therapy: Secondary | ICD-10-CM | POA: Diagnosis not present

## 2020-03-18 DIAGNOSIS — C50212 Malignant neoplasm of upper-inner quadrant of left female breast: Secondary | ICD-10-CM | POA: Diagnosis not present

## 2020-03-18 DIAGNOSIS — C50012 Malignant neoplasm of nipple and areola, left female breast: Secondary | ICD-10-CM | POA: Diagnosis not present

## 2020-03-18 DIAGNOSIS — Z171 Estrogen receptor negative status [ER-]: Secondary | ICD-10-CM | POA: Diagnosis not present

## 2020-03-19 ENCOUNTER — Other Ambulatory Visit: Payer: Self-pay

## 2020-03-19 ENCOUNTER — Ambulatory Visit
Admission: RE | Admit: 2020-03-19 | Discharge: 2020-03-19 | Disposition: A | Discharge status: Home / Self Care | Payer: Medicare Other | Source: Ambulatory Visit | Attending: Radiation Oncology | Admitting: Radiation Oncology

## 2020-03-19 DIAGNOSIS — Z171 Estrogen receptor negative status [ER-]: Secondary | ICD-10-CM | POA: Diagnosis not present

## 2020-03-19 DIAGNOSIS — Z510 Encounter for antineoplastic radiation therapy: Secondary | ICD-10-CM | POA: Diagnosis not present

## 2020-03-19 DIAGNOSIS — C50012 Malignant neoplasm of nipple and areola, left female breast: Secondary | ICD-10-CM | POA: Diagnosis not present

## 2020-03-19 DIAGNOSIS — C50212 Malignant neoplasm of upper-inner quadrant of left female breast: Secondary | ICD-10-CM | POA: Diagnosis not present

## 2020-03-19 MED ORDER — SONAFINE EX EMUL
1.0000 "application " | Freq: Two times a day (BID) | CUTANEOUS | Status: DC
  Administered 2020-03-19: 1 via TOPICAL

## 2020-03-20 ENCOUNTER — Other Ambulatory Visit: Payer: Self-pay

## 2020-03-20 ENCOUNTER — Ambulatory Visit
Admission: RE | Admit: 2020-03-20 | Discharge: 2020-03-20 | Disposition: A | Discharge status: Home / Self Care | Payer: Medicare Other | Source: Ambulatory Visit | Attending: Radiation Oncology | Admitting: Radiation Oncology

## 2020-03-20 DIAGNOSIS — Z510 Encounter for antineoplastic radiation therapy: Secondary | ICD-10-CM | POA: Diagnosis not present

## 2020-03-20 DIAGNOSIS — Z171 Estrogen receptor negative status [ER-]: Secondary | ICD-10-CM | POA: Diagnosis not present

## 2020-03-20 DIAGNOSIS — C50212 Malignant neoplasm of upper-inner quadrant of left female breast: Secondary | ICD-10-CM | POA: Diagnosis not present

## 2020-03-20 DIAGNOSIS — C50012 Malignant neoplasm of nipple and areola, left female breast: Secondary | ICD-10-CM | POA: Diagnosis not present

## 2020-03-23 ENCOUNTER — Ambulatory Visit
Admission: RE | Admit: 2020-03-23 | Discharge: 2020-03-23 | Disposition: A | Discharge status: Home / Self Care | Payer: Medicare Other | Source: Ambulatory Visit | Attending: Radiation Oncology | Admitting: Radiation Oncology

## 2020-03-23 ENCOUNTER — Other Ambulatory Visit: Payer: Self-pay

## 2020-03-23 DIAGNOSIS — Z510 Encounter for antineoplastic radiation therapy: Secondary | ICD-10-CM | POA: Diagnosis not present

## 2020-03-23 DIAGNOSIS — C50212 Malignant neoplasm of upper-inner quadrant of left female breast: Secondary | ICD-10-CM | POA: Diagnosis not present

## 2020-03-23 DIAGNOSIS — Z171 Estrogen receptor negative status [ER-]: Secondary | ICD-10-CM | POA: Diagnosis not present

## 2020-03-23 DIAGNOSIS — C50012 Malignant neoplasm of nipple and areola, left female breast: Secondary | ICD-10-CM | POA: Diagnosis not present

## 2020-03-24 ENCOUNTER — Encounter: Payer: Self-pay | Admitting: Radiation Oncology

## 2020-03-24 ENCOUNTER — Other Ambulatory Visit: Payer: Self-pay

## 2020-03-24 ENCOUNTER — Ambulatory Visit
Admission: RE | Admit: 2020-03-24 | Discharge: 2020-03-24 | Disposition: A | Discharge status: Home / Self Care | Payer: Medicare Other | Source: Ambulatory Visit | Attending: Radiation Oncology | Admitting: Radiation Oncology

## 2020-03-24 DIAGNOSIS — Z171 Estrogen receptor negative status [ER-]: Secondary | ICD-10-CM | POA: Diagnosis not present

## 2020-03-24 DIAGNOSIS — Z510 Encounter for antineoplastic radiation therapy: Secondary | ICD-10-CM | POA: Diagnosis not present

## 2020-03-24 DIAGNOSIS — C50212 Malignant neoplasm of upper-inner quadrant of left female breast: Secondary | ICD-10-CM | POA: Diagnosis not present

## 2020-03-24 DIAGNOSIS — C50012 Malignant neoplasm of nipple and areola, left female breast: Secondary | ICD-10-CM | POA: Diagnosis not present

## 2020-03-25 ENCOUNTER — Ambulatory Visit: Payer: Medicare Other

## 2020-03-25 NOTE — Progress Notes (Signed)
  Patient Name: Adrienne [name] MRN: [id number] DOB: [birth year] Referring Physician: Donne Hazel MATTHEW (Profile Not Attached) Date of Service: 03/24/2020 [facility],                                                         End Of Treatment Note  Diagnoses: C50.212-Malignant neoplasm of upper-inner quadrant of left female breast  Cancer Staging: Stage pT1a, pN0 LeftBreastUIQ,Invasive DuctalCarcinoma with DCIS, ER-/ PR-/ Her2-, Grade3  Intent: Curative  Radiation Treatment Dates: 03/02/2020 through 03/24/2020 Site Technique Total Dose (Gy) Dose per Fx (Gy) Completed Fx Beam Energies  Breast, Left: Breast_Lt 3D 42.72/42.72 2.67 16/16 6X, 15X   Narrative: The patient tolerated radiation therapy relatively well. She did report some mild to moderate fatigue. She denied breast pain. The breast showed less swelling and bruising than it did at the beginning of treatment, but she did develop some hyperpigmentation changes, erythema, and slight skin breakdown in the left inframammary fold. She was advised to apply triple antibiotic ointment to that area.  Plan: The patient will follow-up with radiation oncology in one month.  ________________________________________________   Blair Promise, PhD, MD  This document serves as a record of services personally performed by Gery Pray, MD. It was created on his behalf by Clerance Lav, a trained medical scribe. The creation of this record is based on the scribe's personal observations and the provider's statements to them. This document has been checked and approved by the attending provider.

## 2020-03-26 ENCOUNTER — Ambulatory Visit: Payer: Medicare Other

## 2020-03-27 ENCOUNTER — Other Ambulatory Visit: Payer: Self-pay

## 2020-03-27 ENCOUNTER — Ambulatory Visit (INDEPENDENT_AMBULATORY_CARE_PROVIDER_SITE_OTHER): Payer: Medicare Other | Admitting: Obstetrics & Gynecology

## 2020-03-27 ENCOUNTER — Encounter: Payer: Self-pay | Admitting: Obstetrics & Gynecology

## 2020-03-27 ENCOUNTER — Ambulatory Visit: Payer: Medicare Other

## 2020-03-27 ENCOUNTER — Telehealth: Payer: Self-pay

## 2020-03-27 VITALS — BP 110/74 | HR 68 | Resp 16 | Wt 148.0 lb

## 2020-03-27 DIAGNOSIS — D692 Other nonthrombocytopenic purpura: Secondary | ICD-10-CM | POA: Diagnosis not present

## 2020-03-27 DIAGNOSIS — D1801 Hemangioma of skin and subcutaneous tissue: Secondary | ICD-10-CM | POA: Diagnosis not present

## 2020-03-27 DIAGNOSIS — I63412 Cerebral infarction due to embolism of left middle cerebral artery: Secondary | ICD-10-CM | POA: Diagnosis not present

## 2020-03-27 DIAGNOSIS — L821 Other seborrheic keratosis: Secondary | ICD-10-CM | POA: Diagnosis not present

## 2020-03-27 DIAGNOSIS — N390 Urinary tract infection, site not specified: Secondary | ICD-10-CM

## 2020-03-27 DIAGNOSIS — L580 Acute radiodermatitis: Secondary | ICD-10-CM | POA: Diagnosis not present

## 2020-03-27 DIAGNOSIS — R319 Hematuria, unspecified: Secondary | ICD-10-CM | POA: Diagnosis not present

## 2020-03-27 DIAGNOSIS — L820 Inflamed seborrheic keratosis: Secondary | ICD-10-CM | POA: Diagnosis not present

## 2020-03-27 LAB — POCT URINALYSIS DIPSTICK
Bilirubin, UA: NEGATIVE
Glucose, UA: NEGATIVE
Ketones, UA: NEGATIVE
Nitrite, UA: POSITIVE
Protein, UA: NEGATIVE
Urobilinogen, UA: NEGATIVE E.U./dL — AB
pH, UA: 5 (ref 5.0–8.0)

## 2020-03-27 MED ORDER — SULFAMETHOXAZOLE-TRIMETHOPRIM 800-160 MG PO TABS: 1.0000 | ORAL_TABLET | Freq: Two times a day (BID) | ORAL | 0 refills | Status: DC

## 2020-03-27 NOTE — Progress Notes (Addendum)
GYNECOLOGY  VISIT  CC:   Urinary urgency, pelvic pressure  HPI: 86 y.o. [id number] Married White or Caucasian female here for possible UTI with increase pelvic pressure, dysuria, urinary urgency.  Pt has a pessary and removed it due to some vaginal bleeding.  Review of notes shows she has two areas where the pessary seems to rub at times and she has some granulation tissue at that locations.  She started having increased urinary symptoms after removal of the pessary.  Reports chronic UTI symptoms.  Is taking macrobid.  Saw urology and they advised increasing to bid dosing.  Pt called and asked to see if catheter could be placed.  Do not feel this is the right thing to do for her at this time.  Denies fever.  Denies seeing blood in urine.  She has taken keflex, augmentin, and another cephalosporin this year.      poct urine-rbc 2+, wbc 2+,nitrite +  Creatinine clearance 34.2 calculated today  GYNECOLOGIC HISTORY: Patient's last menstrual period was 09/12/1973 (approximate). Contraception: post menopausal Menopausal hormone therapy: none  Patient Active Problem List   Diagnosis Date Noted  . Breast cancer, left breast ([place]) 01/22/2020  . Malignant neoplasm of upper-inner quadrant of left breast in female, estrogen receptor negative ([place]) 12/20/2019  . Malignant neoplasm of overlapping sites of right breast in female, estrogen receptor positive ([place]) 08/14/2019  . Osteopenia 08/14/2019  . Diplopia 01/29/2016  . GERD (gastroesophageal reflux disease) 01/29/2016  . Cerebrovascular accident (CVA) due to embolism of left middle cerebral artery ([place]) 01/29/2016  . Persistent atrial fibrillation ([place])   . Bleeding gastrointestinal   . Gait disturbance, post-stroke 01/27/2016  . Fall   . Secondary hypertension, unspecified   . Cerebral infarction due to thrombosis of left middle cerebral artery (HCC) s/p mechanical thrombectomy   . Malnutrition of moderate degree 01/09/2016  . GI bleed 01/06/2016   . Chronic atrial fibrillation ([place]) 06/10/2015  . Chronic anticoagulation 06/10/2015  . Hyperlipidemia 06/10/2015  . Essential hypertension 06/10/2015  . History of stroke 06/10/2015  . Coronary artery disease due to lipid rich plaque 06/10/2015  . Afib ([place])   . Elevated uric acid in blood   . Arthritis of ankle   . Pulmonary HTN ([place])     Past Medical History:  Diagnosis Date  . Afib ([place])   . Arthritis of ankle   . CAD (coronary artery disease)   . Cancer [facility]) 2012   breast cancer-right  . Compression fracture of T12 vertebra (HCC)   . Diverticulosis   . Edema   . Elevated uric acid in blood   . GERD (gastroesophageal reflux disease)   . GI bleed 12/2015  . Hyperlipidemia   . Hypertension   . Long-term (current) use of anticoagulants   . Moderate mitral regurgitation   . Pulmonary HTN ([place])    Echo 1/19: EF 60-65, no RWMA, Ao sclerosis, trivial AI, mild MR, mod BAE, mod TR, PASP 49  . Stroke ([place]) 01/25/2016  . TIA (transient ischemic attack) 09/2011  . Urinary incontinence   . Urinary retention     Past Surgical History:  Procedure Laterality Date  . APPENDECTOMY    . AXILLARY SENTINEL NODE BIOPSY Left 01/22/2020   Procedure: Left Axillary Sentinel Node Biopsy;  Surgeon: Rolm Bookbinder, MD;  Location: [facility];  Service: General;  Laterality: Left;  . BREAST LUMPECTOMY WITH RADIOACTIVE SEED AND SENTINEL LYMPH NODE BIOPSY Left 01/22/2020   Procedure: LEFT BREAST LUMPECTOMY WITH RADIOACTIVE SEED;  Surgeon: Rolm Bookbinder, MD;  Location: [facility];  Service: General;  Laterality: Left;  . BREAST SURGERY  2012   Rt.mastectomy--[place], [place]  2013  . CATARACT EXTRACTION, BILATERAL    . DILATION AND CURETTAGE OF UTERUS     in her early 29's for DUB  . ESOPHAGOGASTRODUODENOSCOPY Left 01/08/2016   Procedure: ESOPHAGOGASTRODUODENOSCOPY (EGD);  Surgeon: Arta Silence, MD;  Location: [facility];  Service: Endoscopy;  Laterality: Left;  .  GALLBLADDER SURGERY    . MASTECTOMY Right   . RADIOLOGY WITH ANESTHESIA N/A 01/25/2016   Procedure: RADIOLOGY WITH ANESTHESIA;  Surgeon: Luanne Bras, MD;  Location: [facility];  Service: Radiology;  Laterality: N/A;  . REPLACEMENT TOTAL KNEE BILATERAL    . TONSILLECTOMY    . TONSILLECTOMY AND ADENOIDECTOMY     as a child    MEDS:   Current Outpatient Medications on File Prior to Visit  Medication Sig Dispense Refill  . acetaminophen (TYLENOL) 500 MG tablet Take 500-1,000 mg by mouth every 12 (twelve) hours as needed (pain).     . Ascorbic Acid (VITAMIN C WITH ROSE HIPS) 500 MG tablet Take 500 mg by mouth in the morning and at bedtime.    Marland Kitchen atorvastatin (LIPITOR) 10 MG tablet TAKE 1 TABLET DAILY (Patient taking differently: Take 10 mg by mouth daily in the afternoon. (1600)) 90 tablet 3  . Calcium Citrate-Vitamin D (SM CALCIUM CITRATE+D3 PETITE PO) Take 1 tablet by mouth in the morning, at noon, and at bedtime.    Marland Kitchen diltiazem (CARDIZEM) 60 MG tablet TAKE 1 TABLET THREE TIMES A DAY (Patient taking differently: Take 60 mg by mouth in the morning, at noon, and at bedtime. ) 270 tablet 3  . ELIQUIS 5 MG TABS tablet TAKE 1 TABLET TWICE A DAY (Patient taking differently: Take 5 mg by mouth 2 (two) times daily. ) 180 tablet 1  . furosemide (LASIX) 20 MG tablet Take 1 tablet (20 mg total) by mouth daily. 90 tablet 3  . metoprolol tartrate (LOPRESSOR) 100 MG tablet TAKE 1 TABLET TWICE A DAY (Patient taking differently: Take 100 mg by mouth in the morning and at bedtime. ) 180 tablet 3  . Multiple Vitamin (MULTIVITAMIN WITH MINERALS) TABS tablet Take 1 tablet by mouth daily. Centrum for Women 50+    . nitrofurantoin (MACRODANTIN) 100 MG capsule Take 100 mg by mouth at bedtime.     Marland Kitchen omeprazole (PRILOSEC) 10 MG capsule Take 10 mg by mouth daily before breakfast.     . saccharomyces boulardii (FLORASTOR) 250 MG capsule Take 250 mg by mouth 2 (two) times daily.    Marland Kitchen amoxicillin (AMOXIL) 500 MG capsule  Take 2,000 mg by mouth See admin instructions. Take 4 capsules (2000 mg) by mouth 1 hour prior to dental appointments. (Patient not taking: Reported on 03/27/2020)     No current facility-administered medications on file prior to visit.    ALLERGIES: Valium [diazepam], Amiodarone hcl [amiodarone], Compazine [prochlorperazine], Other, Thorazine [chlorpromazine], Zometa [zoledronic acid], and Ciprofloxacin  Family History  Problem Relation Age of Onset  . Asthma Mother   . CVA Father   . Atrial fibrillation Sister        HAS PACER  . Pneumonia Brother   . Cancer Daughter        COLON CANCER  . Cancer Maternal Grandmother        breast cancer    SH:  Married, non smoker  Review of Systems  Constitutional: Negative.  HENT: Negative.   Eyes: Negative.   Respiratory: Negative.   Cardiovascular: Negative.   Gastrointestinal: Negative.   Endocrine: Negative.   Genitourinary: Positive for difficulty urinating.       Pain with pessary & bleeding  Musculoskeletal: Negative.   Skin: Negative.   Allergic/Immunologic: Negative.   Neurological: Negative.   Hematological: Negative.   Psychiatric/Behavioral: Negative.     PHYSICAL EXAMINATION:    BP 110/74   Pulse 68   Resp 16   Wt 148 lb (67.1 kg)   LMP 09/12/1973 (Approximate)   BMI 28.43 kg/m     General appearance: alert, cooperative and appears stated age Flank:  No CVA tenderness Abdomen: soft, non-tender; bowel sounds normal; no masses,  no organomegaly Lymph:  no inguinal LAD noted  Pelvic: External genitalia:  no lesions              Urethra:  normal appearing urethra with no masses, tenderness or lesions              Bartholins and Skenes: normal                 Vagina: area of thickened tissue on right vaginal side wall with small clot present and at vaginal apex off to her right where additional polypoid appearing tissue is present              Cervix: no lesions              Bimanual Exam:  Uterus:  normal size,  contour, position, consistency, mobility, non-tender              Adnexa: no mass, fullness, tenderness  Highly suggest replacing pessary for UTI treatment and not using a catheter.  Pessary replaced without difficulty.  Pt ok with this plan.  She and I did discuss possibly using a smaller pessary with a knob at the #6 incontinence ring with support has come out the the #7 really does fill the vagina and almost seems just a little too larger.  Will order #6 incontinence ring with support and knob.  Chaperone, Kaitlyn Sprague, was present for exam.  Assessment: Cystitis Probably incomplete emptying with pessary out Vaginal lesions from pessary On UTI prophylaxis Complete uterovaginal prolapse  Plan: Will start bactrim DS bid x 5 days.   Urine culture pending Will order pessary noted above for pt and plan follow up with Dr. Quincy Simmonds when back in office.

## 2020-03-27 NOTE — Telephone Encounter (Signed)
Patient is calling in regards to having discomfort and issues with pessary.

## 2020-03-27 NOTE — Telephone Encounter (Signed)
Spoke with patient. Patient removed her pessary on 03/26/20 due to pain and spotting that has been present for a couple of weeks now. Patient states she is concerned that she will not be able to void since it is now out. She has voided twice this morning, normal amounts. Reports fullness in bladder/pelvis. Patient is requesting an OV today with Dr. Quincy Simmonds for a foley catheter to be placed, she is concerned that she will have to go to the ER over the weekend for this. Denies dysuria, flank pain, fever/chills. Hx of recurrent UTIs, on Macrodantin 100 mg PO daily for prophylaxis, was seen by urology approximately 2 wks ago for urinary symptoms. Urology increased  Macrodantin 100 mg bid, 7/3 -7/9, symptoms did not resolve. Recommended patient return call to urology to provide update.   Patient is scheduled to see Dr. Quincy Simmonds for f/u on 04/06/20. Patient is again requesting an OV today for a foley catheter to be placed while her pessary is out. Advised patient I will have to review with the covering provider and return call. Patient agreeable.   Dr. Sabra Heck -please review and advise.

## 2020-03-27 NOTE — Telephone Encounter (Signed)
Spoke with patient. Advised patient will need to be seen in the office today with Dr.Miller for evaluation. Advised patient placing a catheter to assist with voiding over the weekend is not recommended as this increases her risk for further health complications. Advised patient we will need to recheck her urine in the office as she likely will need a different medication for treatment of UTI symptoms. Advised to place her pessary to allow for voiding. If she is uncomfortable with replacing to bring her pessary with her to the office. Patient verbalizes understanding of all recommendations and information discussed. Appointment scheduled for today at 11:45 am with Dr.Miller. Patient is aware this is a work in appointment.  Routing to provider and will close encounter.

## 2020-03-30 ENCOUNTER — Encounter: Payer: Self-pay | Admitting: Obstetrics & Gynecology

## 2020-03-30 ENCOUNTER — Ambulatory Visit: Payer: Medicare Other

## 2020-03-30 ENCOUNTER — Encounter: Payer: Self-pay | Admitting: *Deleted

## 2020-03-30 DIAGNOSIS — H524 Presbyopia: Secondary | ICD-10-CM | POA: Diagnosis not present

## 2020-03-30 DIAGNOSIS — H04123 Dry eye syndrome of bilateral lacrimal glands: Secondary | ICD-10-CM | POA: Diagnosis not present

## 2020-03-30 LAB — URINE CULTURE

## 2020-03-30 MED ORDER — CEFACLOR 250 MG PO CAPS: 250.0000 mg | ORAL_CAPSULE | Freq: Three times a day (TID) | ORAL | 0 refills | Status: DC

## 2020-04-01 ENCOUNTER — Ambulatory Visit: Payer: Medicare Other | Admitting: Obstetrics and Gynecology

## 2020-04-06 ENCOUNTER — Encounter: Payer: Self-pay | Admitting: Obstetrics and Gynecology

## 2020-04-06 ENCOUNTER — Other Ambulatory Visit: Payer: Self-pay

## 2020-04-06 ENCOUNTER — Ambulatory Visit (INDEPENDENT_AMBULATORY_CARE_PROVIDER_SITE_OTHER): Payer: Medicare Other | Admitting: Obstetrics and Gynecology

## 2020-04-06 VITALS — BP 140/78 | HR 70 | Ht 60.5 in | Wt 150.0 lb

## 2020-04-06 DIAGNOSIS — N390 Urinary tract infection, site not specified: Secondary | ICD-10-CM | POA: Diagnosis not present

## 2020-04-06 DIAGNOSIS — Z4689 Encounter for fitting and adjustment of other specified devices: Secondary | ICD-10-CM

## 2020-04-06 DIAGNOSIS — I63412 Cerebral infarction due to embolism of left middle cerebral artery: Secondary | ICD-10-CM

## 2020-04-06 DIAGNOSIS — N993 Prolapse of vaginal vault after hysterectomy: Secondary | ICD-10-CM

## 2020-04-06 NOTE — Progress Notes (Signed)
GYNECOLOGY  VISIT   HPI: 86 y.o.   Married  Caucasian  female   [id number] with Patient's last menstrual period was 09/12/1973 (approximate).   here for follow up.   Patient was seen by Dr. Sabra Heck for vaginal bleeding and discomfort and a UTI on 03/27/20. She has chronic granulation tissue from her #7 pessary ring with support.  She had an E Coli UTI while on Macrobid prophylaxis for UTI.  She was switched from Bactrim DS to Ceclor.   She had her pessary placed back at the visit on that day, and it is comfortable again.  Patient took her pessary out today in preparation for this visit.   GYNECOLOGIC HISTORY: Patient's last menstrual period was 09/12/1973 (approximate). Contraception:  PMP Menopausal hormone therapy:  none Last mammogram: 11-20-19 Unilateral Lt.Br./masses and asymmetry/density C--Lt.Br.US and Bx reveals GRADE III INVASIVE DUCTAL CARCINOMA. DUCTAL CARCINOMA IN SITU of the LEFT breast, 11 o'clock, upper inner,ribbon clip. Last pap smear: 03-11-19 Neg, 11-25-15 Neg        OB History    Gravida  3   Para  3   Term  3   Preterm      AB      Living  3     SAB      TAB      Ectopic      Multiple      Live Births                 Patient Active Problem List   Diagnosis Date Noted  . Breast cancer, left breast ([place]) 01/22/2020  . Malignant neoplasm of upper-inner quadrant of left breast in female, estrogen receptor negative ([place]) 12/20/2019  . Malignant neoplasm of overlapping sites of right breast in female, estrogen receptor positive ([place]) 08/14/2019  . Osteopenia 08/14/2019  . Diplopia 01/29/2016  . GERD (gastroesophageal reflux disease) 01/29/2016  . Cerebrovascular accident (CVA) due to embolism of left middle cerebral artery ([place]) 01/29/2016  . Persistent atrial fibrillation ([place])   . Bleeding gastrointestinal   . Gait disturbance, post-stroke 01/27/2016  . Fall   . Secondary hypertension, unspecified   . Cerebral infarction due to thrombosis of  left middle cerebral artery (HCC) s/p mechanical thrombectomy   . Malnutrition of moderate degree 01/09/2016  . GI bleed 01/06/2016  . Chronic atrial fibrillation ([place]) 06/10/2015  . Chronic anticoagulation 06/10/2015  . Hyperlipidemia 06/10/2015  . Essential hypertension 06/10/2015  . History of stroke 06/10/2015  . Coronary artery disease due to lipid rich plaque 06/10/2015  . Afib ([place])   . Elevated uric acid in blood   . Arthritis of ankle   . Pulmonary HTN ([place])     Past Medical History:  Diagnosis Date  . Afib ([place])   . Arthritis of ankle   . CAD (coronary artery disease)   . Cancer [facility]) 2012   breast cancer-right  . Compression fracture of T12 vertebra (HCC)   . Diverticulosis   . Edema   . Elevated uric acid in blood   . GERD (gastroesophageal reflux disease)   . GI bleed 12/2015  . Hyperlipidemia   . Hypertension   . Long-term (current) use of anticoagulants   . Moderate mitral regurgitation   . Pulmonary HTN ([place])    Echo 1/19: EF 60-65, no RWMA, Ao sclerosis, trivial AI, mild MR, mod BAE, mod TR, PASP 49  . Stroke ([place]) 01/25/2016  . TIA (transient ischemic attack) 09/2011  . Urinary incontinence   .  Urinary retention     Past Surgical History:  Procedure Laterality Date  . APPENDECTOMY    . AXILLARY SENTINEL NODE BIOPSY Left 01/22/2020   Procedure: Left Axillary Sentinel Node Biopsy;  Surgeon: Rolm Bookbinder, MD;  Location: [facility];  Service: General;  Laterality: Left;  . BREAST LUMPECTOMY WITH RADIOACTIVE SEED AND SENTINEL LYMPH NODE BIOPSY Left 01/22/2020   Procedure: LEFT BREAST LUMPECTOMY WITH RADIOACTIVE SEED;  Surgeon: Rolm Bookbinder, MD;  Location: [facility];  Service: General;  Laterality: Left;  . BREAST SURGERY  2012   Rt.mastectomy--[place], [place]  2013  . CATARACT EXTRACTION, BILATERAL    . DILATION AND CURETTAGE OF UTERUS     in her early 86's for DUB  . ESOPHAGOGASTRODUODENOSCOPY Left 01/08/2016   Procedure:  ESOPHAGOGASTRODUODENOSCOPY (EGD);  Surgeon: Arta Silence, MD;  Location: [facility];  Service: Endoscopy;  Laterality: Left;  . GALLBLADDER SURGERY    . MASTECTOMY Right   . RADIOLOGY WITH ANESTHESIA N/A 01/25/2016   Procedure: RADIOLOGY WITH ANESTHESIA;  Surgeon: Luanne Bras, MD;  Location: [facility];  Service: Radiology;  Laterality: N/A;  . REPLACEMENT TOTAL KNEE BILATERAL    . TONSILLECTOMY    . TONSILLECTOMY AND ADENOIDECTOMY     as a child    Current Outpatient Medications  Medication Sig Dispense Refill  . acetaminophen (TYLENOL) 500 MG tablet Take 500-1,000 mg by mouth every 12 (twelve) hours as needed (pain).     Marland Kitchen amoxicillin (AMOXIL) 500 MG capsule Take 2,000 mg by mouth See admin instructions. Take 4 capsules (2000 mg) by mouth 1 hour prior to dental appointments.    . Ascorbic Acid (VITAMIN C WITH ROSE HIPS) 500 MG tablet Take 500 mg by mouth in the morning and at bedtime.    Marland Kitchen atorvastatin (LIPITOR) 10 MG tablet TAKE 1 TABLET DAILY (Patient taking differently: Take 10 mg by mouth daily in the afternoon. (1600)) 90 tablet 3  . Calcium Citrate-Vitamin D (SM CALCIUM CITRATE+D3 PETITE PO) Take 1 tablet by mouth in the morning, at noon, and at bedtime.    . cefaclor (CECLOR) 250 MG capsule Take 1 capsule (250 mg total) by mouth 3 (three) times daily. 15 capsule 0  . diltiazem (CARDIZEM) 60 MG tablet TAKE 1 TABLET THREE TIMES A DAY (Patient taking differently: Take 60 mg by mouth in the morning, at noon, and at bedtime. ) 270 tablet 3  . ELIQUIS 5 MG TABS tablet TAKE 1 TABLET TWICE A DAY (Patient taking differently: Take 5 mg by mouth 2 (two) times daily. ) 180 tablet 1  . furosemide (LASIX) 20 MG tablet Take 1 tablet (20 mg total) by mouth daily. 90 tablet 3  . metoprolol tartrate (LOPRESSOR) 100 MG tablet TAKE 1 TABLET TWICE A DAY (Patient taking differently: Take 100 mg by mouth in the morning and at bedtime. ) 180 tablet 3  . Multiple Vitamin (MULTIVITAMIN WITH MINERALS) TABS  tablet Take 1 tablet by mouth daily. Centrum for Women 50+    . omeprazole (PRILOSEC) 10 MG capsule Take 10 mg by mouth daily before breakfast.     . saccharomyces boulardii (FLORASTOR) 250 MG capsule Take 250 mg by mouth 2 (two) times daily.    . nitrofurantoin (MACRODANTIN) 100 MG capsule Take 100 mg by mouth at bedtime.  (Patient not taking: Reported on 04/06/2020)     No current facility-administered medications for this visit.     ALLERGIES: Valium [diazepam], Amiodarone hcl [amiodarone], Compazine [prochlorperazine], Other, Thorazine [chlorpromazine], Zometa [  zoledronic acid], and Ciprofloxacin  Family History  Problem Relation Age of Onset  . Asthma Mother   . CVA Father   . Atrial fibrillation Sister        HAS PACER  . Pneumonia Brother   . Cancer Daughter        COLON CANCER  . Cancer Maternal Grandmother        breast cancer    Social History   Socioeconomic History  . Marital status: Married    Spouse name: Not on file  . Number of children: Not on file  . Years of education: Not on file  . Highest education level: Not on file  Occupational History  . Not on file  Tobacco Use  . Smoking status: Former Smoker    Packs/day: 0.25    Years: 2.00    Pack years: 0.50    Types: Cigarettes    Quit date: 1950    Years since quitting: 71.6  . Smokeless tobacco: Never Used  Vaping Use  . Vaping Use: Never used  Substance and Sexual Activity  . Alcohol use: No    Alcohol/week: 0.0 standard drinks  . Drug use: No  . Sexual activity: Not Currently    Partners: Male    Birth control/protection: Post-menopausal  Other Topics Concern  . Not on file  Social History Narrative  . Not on file   Social Determinants of Health   Financial Resource Strain:   . Difficulty of Paying Living Expenses:   Food Insecurity:   . Worried About Charity fundraiser in the Last Year:   . Arboriculturist in the Last Year:   Transportation Needs:   . Film/video editor  (Medical):   Marland Kitchen Lack of Transportation (Non-Medical):   Physical Activity:   . Days of Exercise per Week:   . Minutes of Exercise per Session:   Stress:   . Feeling of Stress :   Social Connections:   . Frequency of Communication with Friends and Family:   . Frequency of Social Gatherings with Friends and Family:   . Attends Religious Services:   . Active Member of Clubs or Organizations:   . Attends Archivist Meetings:   Marland Kitchen Marital Status:   Intimate Partner Violence:   . Fear of Current or Ex-Partner:   . Emotionally Abused:   Marland Kitchen Physically Abused:   . Sexually Abused:     Review of Systems  All other systems reviewed and are negative.   PHYSICAL EXAMINATION:    BP (!) 140/78 (Cuff Size: Large)   Pulse 70   Ht 5' 0.5" (1.537 m)   Wt 150 lb (68 kg)   LMP 09/12/1973 (Approximate)   BMI 28.81 kg/m     General appearance: alert, cooperative and appears stated age  Bladder catheterization.  Verbal consent for procedure.  Sterile prep with betadine.  Catheter placed and about 18 cc clear urine removed and will be sent for UC.   Pelvic: External genitalia:  no lesions              Urethra:  normal appearing urethra with no masses, tenderness or lesions              Bartholins and Skenes: normal                 Vagina: normal appearing vagina with normal color and discharge, granulation tissue of the left vaginal wall, treated with AgNO3.  Cervix: absent                Bimanual Exam:  Uterus:  Absent.               Adnexa: no mass, fullness, tenderness            I placed an incontinence ring with support #6, 3 1/4 inches.  Lot [id number], exp 12/10/2020. REF number [id number]. The pessary is comfortable for the patient and does not fall out with standing and walking.   Chaperone was present for exam.  ASSESSMENT  Complete vaginal prolapse after hysterectomy.  Recurrent UTI.   PLAN  Number 6 incontinence dish to patient.  UC sent.  FU in one  week.

## 2020-04-08 LAB — URINE CULTURE

## 2020-04-13 NOTE — Progress Notes (Signed)
GYNECOLOGY  VISIT   HPI: 86 y.o.   Married  Caucasian  female   [phone] with Patient's last menstrual period was 09/12/1973 (approximate).   here for 1 week pessary check.  New pessary is working great.  Emptying her bladder well.  Does have some leakage and wears a pad all the time.  No improvement with this.  Cannot get to the bathroom quickly enough.   She had some brown discharge following treatment with silver nitrate at her last office visit.  She took her pessary out this morning and notices some discoloration which washed off.   Her urine culture recheck was negative 04/06/20.  She is taking Macrobid 100 mg nightly for prophylaxis.   GYNECOLOGIC HISTORY: Patient's last menstrual period was 09/12/1973 (approximate). Contraception:  PMP Menopausal hormone therapy:  None Last mammogram: 11-20-19 Unilateral Lt.Br./masses and asymmetry/density C--Lt.Br.US and Bx reveals GRADE III INVASIVE DUCTAL CARCINOMA. DUCTAL CARCINOMA IN SITU of the LEFT breast, 11 o'clock, upper inner,ribbon clip. Last pap smear:  03-11-19 Neg, 11-25-15 Neg        OB History    Gravida  3   Para  3   Term  3   Preterm      AB      Living  3     SAB      TAB      Ectopic      Multiple      Live Births                 Patient Active Problem List   Diagnosis Date Noted  . Breast cancer, left breast ([place]) 01/22/2020  . Malignant neoplasm of upper-inner quadrant of left breast in female, estrogen receptor negative ([place]) 12/20/2019  . Malignant neoplasm of overlapping sites of right breast in female, estrogen receptor positive ([place]) 08/14/2019  . Osteopenia 08/14/2019  . Diplopia 01/29/2016  . GERD (gastroesophageal reflux disease) 01/29/2016  . Cerebrovascular accident (CVA) due to embolism of left middle cerebral artery ([place]) 01/29/2016  . Persistent atrial fibrillation ([place])   . Bleeding gastrointestinal   . Gait disturbance, post-stroke 01/27/2016  . Fall   . Secondary  hypertension, unspecified   . Cerebral infarction due to thrombosis of left middle cerebral artery (HCC) s/p mechanical thrombectomy   . Malnutrition of moderate degree 01/09/2016  . GI bleed 01/06/2016  . Chronic atrial fibrillation ([place]) 06/10/2015  . Chronic anticoagulation 06/10/2015  . Hyperlipidemia 06/10/2015  . Essential hypertension 06/10/2015  . History of stroke 06/10/2015  . Coronary artery disease due to lipid rich plaque 06/10/2015  . Afib ([place])   . Elevated uric acid in blood   . Arthritis of ankle   . Pulmonary HTN ([place])     Past Medical History:  Diagnosis Date  . Afib ([place])   . Arthritis of ankle   . CAD (coronary artery disease)   . Cancer [facility]) 2012   breast cancer-right  . Compression fracture of T12 vertebra (HCC)   . Diverticulosis   . Edema   . Elevated uric acid in blood   . GERD (gastroesophageal reflux disease)   . GI bleed 12/2015  . Hyperlipidemia   . Hypertension   . Long-term (current) use of anticoagulants   . Moderate mitral regurgitation   . Pulmonary HTN ([place])    Echo 1/19: EF 60-65, no RWMA, Ao sclerosis, trivial AI, mild MR, mod BAE, mod TR, PASP 49  . Stroke ([place]) 01/25/2016  . TIA (transient ischemic attack) 09/2011  .  Urinary incontinence   . Urinary retention     Past Surgical History:  Procedure Laterality Date  . APPENDECTOMY    . AXILLARY SENTINEL NODE BIOPSY Left 01/22/2020   Procedure: Left Axillary Sentinel Node Biopsy;  Surgeon: Rolm Bookbinder, MD;  Location: [facility];  Service: General;  Laterality: Left;  . BREAST LUMPECTOMY WITH RADIOACTIVE SEED AND SENTINEL LYMPH NODE BIOPSY Left 01/22/2020   Procedure: LEFT BREAST LUMPECTOMY WITH RADIOACTIVE SEED;  Surgeon: Rolm Bookbinder, MD;  Location: [place];  Service: General;  Laterality: Left;  . BREAST SURGERY  2012   Rt.mastectomy--[place], [place]  2013  . CATARACT EXTRACTION, BILATERAL    . DILATION AND CURETTAGE OF UTERUS     in her early 71's  for DUB  . ESOPHAGOGASTRODUODENOSCOPY Left 01/08/2016   Procedure: ESOPHAGOGASTRODUODENOSCOPY (EGD);  Surgeon: Arta Silence, MD;  Location: [facility];  Service: Endoscopy;  Laterality: Left;  . GALLBLADDER SURGERY    . MASTECTOMY Right   . RADIOLOGY WITH ANESTHESIA N/A 01/25/2016   Procedure: RADIOLOGY WITH ANESTHESIA;  Surgeon: Luanne Bras, MD;  Location: [facility];  Service: Radiology;  Laterality: N/A;  . REPLACEMENT TOTAL KNEE BILATERAL    . TONSILLECTOMY    . TONSILLECTOMY AND ADENOIDECTOMY     as a child    Current Outpatient Medications  Medication Sig Dispense Refill  . acetaminophen (TYLENOL) 500 MG tablet Take 500-1,000 mg by mouth every 12 (twelve) hours as needed (pain).     Marland Kitchen amoxicillin (AMOXIL) 500 MG capsule Take 2,000 mg by mouth See admin instructions. Take 4 capsules (2000 mg) by mouth 1 hour prior to dental appointments.    . Ascorbic Acid (VITAMIN C WITH ROSE HIPS) 500 MG tablet Take 500 mg by mouth in the morning and at bedtime.    Marland Kitchen atorvastatin (LIPITOR) 10 MG tablet TAKE 1 TABLET DAILY (Patient taking differently: Take 10 mg by mouth daily in the afternoon. (1600)) 90 tablet 3  . Calcium Citrate-Vitamin D (SM CALCIUM CITRATE+D3 PETITE PO) Take 1 tablet by mouth in the morning, at noon, and at bedtime.    . cefaclor (CECLOR) 250 MG capsule Take 1 capsule (250 mg total) by mouth 3 (three) times daily. 15 capsule 0  . diltiazem (CARDIZEM) 60 MG tablet TAKE 1 TABLET THREE TIMES A DAY (Patient taking differently: Take 60 mg by mouth in the morning, at noon, and at bedtime. ) 270 tablet 3  . ELIQUIS 5 MG TABS tablet TAKE 1 TABLET TWICE A DAY (Patient taking differently: Take 5 mg by mouth 2 (two) times daily. ) 180 tablet 1  . furosemide (LASIX) 20 MG tablet Take 1 tablet (20 mg total) by mouth daily. 90 tablet 3  . metoprolol tartrate (LOPRESSOR) 100 MG tablet TAKE 1 TABLET TWICE A DAY (Patient taking differently: Take 100 mg by mouth in the morning and at bedtime. )  180 tablet 3  . Multiple Vitamin (MULTIVITAMIN WITH MINERALS) TABS tablet Take 1 tablet by mouth daily. Centrum for Women 50+    . nitrofurantoin (MACRODANTIN) 100 MG capsule Take 100 mg by mouth at bedtime.     Marland Kitchen omeprazole (PRILOSEC) 10 MG capsule Take 10 mg by mouth daily before breakfast.     . saccharomyces boulardii (FLORASTOR) 250 MG capsule Take 250 mg by mouth 2 (two) times daily.     No current facility-administered medications for this visit.     ALLERGIES: Valium [diazepam], Amiodarone hcl [amiodarone], Compazine [prochlorperazine], Other, Thorazine [chlorpromazine], Zometa [zoledronic  acid], and Ciprofloxacin  Family History  Problem Relation Age of Onset  . Asthma Mother   . CVA Father   . Atrial fibrillation Sister        HAS PACER  . Pneumonia Brother   . Cancer Daughter        COLON CANCER  . Cancer Maternal Grandmother        breast cancer    Social History   Socioeconomic History  . Marital status: Married    Spouse name: Not on file  . Number of children: Not on file  . Years of education: Not on file  . Highest education level: Not on file  Occupational History  . Not on file  Tobacco Use  . Smoking status: Former Smoker    Packs/day: 0.25    Years: 2.00    Pack years: 0.50    Types: Cigarettes    Quit date: 1950    Years since quitting: 71.6  . Smokeless tobacco: Never Used  Vaping Use  . Vaping Use: Never used  Substance and Sexual Activity  . Alcohol use: No    Alcohol/week: 0.0 standard drinks  . Drug use: No  . Sexual activity: Not Currently    Partners: Male    Birth control/protection: Post-menopausal  Other Topics Concern  . Not on file  Social History Narrative  . Not on file   Social Determinants of Health   Financial Resource Strain:   . Difficulty of Paying Living Expenses:   Food Insecurity:   . Worried About Charity fundraiser in the Last Year:   . Arboriculturist in the Last Year:   Transportation Needs:   . Lexicographer (Medical):   Marland Kitchen Lack of Transportation (Non-Medical):   Physical Activity:   . Days of Exercise per Week:   . Minutes of Exercise per Session:   Stress:   . Feeling of Stress :   Social Connections:   . Frequency of Communication with Friends and Family:   . Frequency of Social Gatherings with Friends and Family:   . Attends Religious Services:   . Active Member of Clubs or Organizations:   . Attends Archivist Meetings:   Marland Kitchen Marital Status:   Intimate Partner Violence:   . Fear of Current or Ex-Partner:   . Emotionally Abused:   Marland Kitchen Physically Abused:   . Sexually Abused:     Review of Systems  All other systems reviewed and are negative.   PHYSICAL EXAMINATION:    Ht 5' 0.5" (1.537 m)   Wt 147 lb 12.8 oz (67 kg)   LMP 09/12/1973 (Approximate)   BMI 28.39 kg/m     General appearance: alert, cooperative and appears stated age    Pelvic: External genitalia:  no lesions              Urethra:  normal appearing urethra with no masses, tenderness or lesions              Bartholins and Skenes: normal                 Vagina: normal appearing vagina with normal color and discharge,  Left vaginal wall granulation tissue, not bleeding.                Cervix: no lesions                Bimanual Exam:  Uterus:  normal size, contour, position, consistency, mobility, non-tender.  Complete prolapse.              Adnexa: no mass, fullness, tenderness            Chaperone was present for exam.  ASSESSMENT  Complete uterovaginal prolapse.  Doing well with #6 incontinence ring with support.  Granulation tissue improved after treating with AgNO3 and using smaller pessary.  Pessary maintenance.  Recurrent UTIs.  Breast cancer.   PLAN  Continue current pessary.  Call for any problems.  She may choose to return here for future bladder infection.  Continue with her Macrobid daily.  FU for a recheck in 6 weeks, sooner if needed.

## 2020-04-14 ENCOUNTER — Other Ambulatory Visit: Payer: Self-pay

## 2020-04-14 ENCOUNTER — Ambulatory Visit (INDEPENDENT_AMBULATORY_CARE_PROVIDER_SITE_OTHER): Payer: Medicare Other | Admitting: Obstetrics and Gynecology

## 2020-04-14 ENCOUNTER — Encounter: Payer: Self-pay | Admitting: Obstetrics and Gynecology

## 2020-04-14 ENCOUNTER — Telehealth: Payer: Self-pay

## 2020-04-14 VITALS — BP 140/68 | HR 64 | Ht 60.5 in | Wt 147.0 lb

## 2020-04-14 VITALS — BP 140/68 | HR 64 | Ht 60.5 in | Wt 147.8 lb

## 2020-04-14 DIAGNOSIS — N813 Complete uterovaginal prolapse: Secondary | ICD-10-CM | POA: Diagnosis not present

## 2020-04-14 DIAGNOSIS — I63412 Cerebral infarction due to embolism of left middle cerebral artery: Secondary | ICD-10-CM

## 2020-04-14 DIAGNOSIS — Z4689 Encounter for fitting and adjustment of other specified devices: Secondary | ICD-10-CM | POA: Diagnosis not present

## 2020-04-14 DIAGNOSIS — R339 Retention of urine, unspecified: Secondary | ICD-10-CM

## 2020-04-14 DIAGNOSIS — N390 Urinary tract infection, site not specified: Secondary | ICD-10-CM

## 2020-04-14 LAB — POCT URINALYSIS DIPSTICK
Bilirubin, UA: NEGATIVE
Blood, UA: NEGATIVE
Glucose, UA: NEGATIVE
Ketones, UA: NEGATIVE
Leukocytes, UA: NEGATIVE
Nitrite, UA: NEGATIVE
Protein, UA: NEGATIVE
Urobilinogen, UA: 0.2 E.U./dL
pH, UA: 6 (ref 5.0–8.0)

## 2020-04-14 NOTE — Telephone Encounter (Signed)
Patient is calling in regards to not being able to urinate with pessary. Patient is wanting to come in for appointment.

## 2020-04-14 NOTE — Telephone Encounter (Signed)
Spoke with patient. Patient was seen in office this morning, reports she is unable to void with pessary. Reports increased pressure in bladder.  She is currently outside of the office, requesting OV.   Advised patient to come on into office, will work into Dr. Elza Rafter schedule. Patient agreeable.   Provider notified.   Encounter closed.

## 2020-04-14 NOTE — Progress Notes (Signed)
GYNECOLOGY  VISIT   HPI: 86 y.o.   Married  Caucasian  female   [phone] with Patient's last menstrual period was 09/12/1973 (approximate).   here for urine retention.    Feeling a lot of pelvic pressure.  She had a pessary check earlier today and was doing well then. Now she cannot urinate.  Hx UTIs.   Urine Dip: Neg  GYNECOLOGIC HISTORY: Patient's last menstrual period was 09/12/1973 (approximate). Contraception:  PMP Menopausal hormone therapy:  None Last mammogram: 11-20-19 Unilateral Lt.Br./masses and asymmetry/density C--Lt.Br.US and Bx revealsGRADE III INVASIVE DUCTAL CARCINOMA. DUCTAL CARCINOMA IN SITU of the LEFT breast, 11 o'clock, upper inner,ribbon clip. Last pap smear: 03-11-19 Neg,11-25-15 Neg        OB History    Gravida  3   Para  3   Term  3   Preterm      AB      Living  3     SAB      TAB      Ectopic      Multiple      Live Births                 Patient Active Problem List   Diagnosis Date Noted  . Breast cancer, left breast ([place]) 01/22/2020  . Malignant neoplasm of upper-inner quadrant of left breast in female, estrogen receptor negative ([place]) 12/20/2019  . Malignant neoplasm of overlapping sites of right breast in female, estrogen receptor positive ([place]) 08/14/2019  . Osteopenia 08/14/2019  . Diplopia 01/29/2016  . GERD (gastroesophageal reflux disease) 01/29/2016  . Cerebrovascular accident (CVA) due to embolism of left middle cerebral artery ([place]) 01/29/2016  . Persistent atrial fibrillation ([place])   . Bleeding gastrointestinal   . Gait disturbance, post-stroke 01/27/2016  . Fall   . Secondary hypertension, unspecified   . Cerebral infarction due to thrombosis of left middle cerebral artery (HCC) s/p mechanical thrombectomy   . Malnutrition of moderate degree 01/09/2016  . GI bleed 01/06/2016  . Chronic atrial fibrillation ([place]) 06/10/2015  . Chronic anticoagulation 06/10/2015  . Hyperlipidemia 06/10/2015  . Essential  hypertension 06/10/2015  . History of stroke 06/10/2015  . Coronary artery disease due to lipid rich plaque 06/10/2015  . Afib ([place])   . Elevated uric acid in blood   . Arthritis of ankle   . Pulmonary HTN ([place])     Past Medical History:  Diagnosis Date  . Afib ([place])   . Arthritis of ankle   . CAD (coronary artery disease)   . Cancer [facility]) 2012   breast cancer-right  . Compression fracture of T12 vertebra (HCC)   . Diverticulosis   . Edema   . Elevated uric acid in blood   . GERD (gastroesophageal reflux disease)   . GI bleed 12/2015  . Hyperlipidemia   . Hypertension   . Long-term (current) use of anticoagulants   . Moderate mitral regurgitation   . Pulmonary HTN ([place])    Echo 1/19: EF 60-65, no RWMA, Ao sclerosis, trivial AI, mild MR, mod BAE, mod TR, PASP 49  . Stroke ([place]) 01/25/2016  . TIA (transient ischemic attack) 09/2011  . Urinary incontinence   . Urinary retention     Past Surgical History:  Procedure Laterality Date  . APPENDECTOMY    . AXILLARY SENTINEL NODE BIOPSY Left 01/22/2020   Procedure: Left Axillary Sentinel Node Biopsy;  Surgeon: Rolm Bookbinder, MD;  Location: [facility];  Service: General;  Laterality: Left;  . BREAST  LUMPECTOMY WITH RADIOACTIVE SEED AND SENTINEL LYMPH NODE BIOPSY Left 01/22/2020   Procedure: LEFT BREAST LUMPECTOMY WITH RADIOACTIVE SEED;  Surgeon: Rolm Bookbinder, MD;  Location: [place];  Service: General;  Laterality: Left;  . BREAST SURGERY  2012   Rt.mastectomy--[place], [place]  2013  . CATARACT EXTRACTION, BILATERAL    . DILATION AND CURETTAGE OF UTERUS     in her early 60's for DUB  . ESOPHAGOGASTRODUODENOSCOPY Left 01/08/2016   Procedure: ESOPHAGOGASTRODUODENOSCOPY (EGD);  Surgeon: Arta Silence, MD;  Location: [facility];  Service: Endoscopy;  Laterality: Left;  . GALLBLADDER SURGERY    . MASTECTOMY Right   . RADIOLOGY WITH ANESTHESIA N/A 01/25/2016   Procedure: RADIOLOGY WITH ANESTHESIA;   Surgeon: Luanne Bras, MD;  Location: [facility];  Service: Radiology;  Laterality: N/A;  . REPLACEMENT TOTAL KNEE BILATERAL    . TONSILLECTOMY    . TONSILLECTOMY AND ADENOIDECTOMY     as a child    Current Outpatient Medications  Medication Sig Dispense Refill  . acetaminophen (TYLENOL) 500 MG tablet Take 500-1,000 mg by mouth every 12 (twelve) hours as needed (pain).     Marland Kitchen amoxicillin (AMOXIL) 500 MG capsule Take 2,000 mg by mouth See admin instructions. Take 4 capsules (2000 mg) by mouth 1 hour prior to dental appointments.    . Ascorbic Acid (VITAMIN C WITH ROSE HIPS) 500 MG tablet Take 500 mg by mouth in the morning and at bedtime.    Marland Kitchen atorvastatin (LIPITOR) 10 MG tablet TAKE 1 TABLET DAILY (Patient taking differently: Take 10 mg by mouth daily in the afternoon. (1600)) 90 tablet 3  . Calcium Citrate-Vitamin D (SM CALCIUM CITRATE+D3 PETITE PO) Take 1 tablet by mouth in the morning, at noon, and at bedtime.    . cefaclor (CECLOR) 250 MG capsule Take 1 capsule (250 mg total) by mouth 3 (three) times daily. 15 capsule 0  . diltiazem (CARDIZEM) 60 MG tablet TAKE 1 TABLET THREE TIMES A DAY (Patient taking differently: Take 60 mg by mouth in the morning, at noon, and at bedtime. ) 270 tablet 3  . ELIQUIS 5 MG TABS tablet TAKE 1 TABLET TWICE A DAY (Patient taking differently: Take 5 mg by mouth 2 (two) times daily. ) 180 tablet 1  . furosemide (LASIX) 20 MG tablet Take 1 tablet (20 mg total) by mouth daily. 90 tablet 3  . metoprolol tartrate (LOPRESSOR) 100 MG tablet TAKE 1 TABLET TWICE A DAY (Patient taking differently: Take 100 mg by mouth in the morning and at bedtime. ) 180 tablet 3  . Multiple Vitamin (MULTIVITAMIN WITH MINERALS) TABS tablet Take 1 tablet by mouth daily. Centrum for Women 50+    . nitrofurantoin (MACRODANTIN) 100 MG capsule Take 100 mg by mouth at bedtime.     Marland Kitchen omeprazole (PRILOSEC) 10 MG capsule Take 10 mg by mouth daily before breakfast.     . saccharomyces boulardii  (FLORASTOR) 250 MG capsule Take 250 mg by mouth 2 (two) times daily.     No current facility-administered medications for this visit.     ALLERGIES: Valium [diazepam], Amiodarone hcl [amiodarone], Compazine [prochlorperazine], Other, Thorazine [chlorpromazine], Zometa [zoledronic acid], and Ciprofloxacin  Family History  Problem Relation Age of Onset  . Asthma Mother   . CVA Father   . Atrial fibrillation Sister        HAS PACER  . Pneumonia Brother   . Cancer Daughter        COLON CANCER  . Cancer  Maternal Grandmother        breast cancer    Social History   Socioeconomic History  . Marital status: Married    Spouse name: Not on file  . Number of children: Not on file  . Years of education: Not on file  . Highest education level: Not on file  Occupational History  . Not on file  Tobacco Use  . Smoking status: Former Smoker    Packs/day: 0.25    Years: 2.00    Pack years: 0.50    Types: Cigarettes    Quit date: 1950    Years since quitting: 71.6  . Smokeless tobacco: Never Used  Vaping Use  . Vaping Use: Never used  Substance and Sexual Activity  . Alcohol use: No    Alcohol/week: 0.0 standard drinks  . Drug use: No  . Sexual activity: Not Currently    Partners: Male    Birth control/protection: Post-menopausal  Other Topics Concern  . Not on file  Social History Narrative  . Not on file   Social Determinants of Health   Financial Resource Strain:   . Difficulty of Paying Living Expenses:   Food Insecurity:   . Worried About Charity fundraiser in the Last Year:   . Arboriculturist in the Last Year:   Transportation Needs:   . Film/video editor (Medical):   Marland Kitchen Lack of Transportation (Non-Medical):   Physical Activity:   . Days of Exercise per Week:   . Minutes of Exercise per Session:   Stress:   . Feeling of Stress :   Social Connections:   . Frequency of Communication with Friends and Family:   . Frequency of Social Gatherings with Friends  and Family:   . Attends Religious Services:   . Active Member of Clubs or Organizations:   . Attends Archivist Meetings:   Marland Kitchen Marital Status:   Intimate Partner Violence:   . Fear of Current or Ex-Partner:   . Emotionally Abused:   Marland Kitchen Physically Abused:   . Sexually Abused:     Review of Systems  All other systems reviewed and are negative.   PHYSICAL EXAMINATION:    BP 140/68 (Cuff Size: Large)   Pulse 64   Ht 5' 0.5" (1.537 m)   Wt 147 lb (66.7 kg)   LMP 09/12/1973 (Approximate)   BMI 28.24 kg/m     General appearance: alert, cooperative and appears stated age            Bladder cath for 390 cc after sterile prep with betadine.  Urine dip - negative for everything.  #6 incontinence ring removed and #7 ring with support placed.   Chaperone was present for exam.  ASSESSMENT  Urinary retention.  Hx UTIs.  PLAN  Former pessary #7 ring with support placed.  UC.  FU in 2 weeks.

## 2020-04-17 ENCOUNTER — Telehealth: Payer: Self-pay | Admitting: *Deleted

## 2020-04-17 LAB — URINE CULTURE

## 2020-04-17 MED ORDER — CEFDINIR 300 MG PO CAPS
300.0000 mg | ORAL_CAPSULE | Freq: Two times a day (BID) | ORAL | 0 refills | Status: AC
Start: 2020-04-17 — End: 2020-04-22

## 2020-04-17 NOTE — Telephone Encounter (Signed)
Burnice Logan, RN  04/17/2020 1:49 PM EDT Back to Top    Left message to call Sharee Pimple, RN at [facility].     Dr. Quincy Simmonds -patient has a documented allergy/contraindication to ciprofloacin.  Please advise on levofloxacin.

## 2020-04-17 NOTE — Telephone Encounter (Signed)
Spoke with patient, advised as seen below per Dr. Quincy Simmonds.  Patient reports she has taken cefdinir in the past and tolerated well.  Rx to verified pharmacy.  Patient will contact urology for f/u. Patient verbalizes understanding and is agreeable.   Encounter closed.

## 2020-04-17 NOTE — Telephone Encounter (Addendum)
-----   Message from Nunzio Cobbs, MD sent at 04/17/2020  1:06 PM EDT ----- Second result note for this patient.   I just saw her kidney function is decreased, and I recommend the Levofloxacin 250 mg po every 48 hours x 3 total doses.   Disp:  3 tabs RF:  None  Nunzio Cobbs, MD  04/17/2020 1:01 PM EDT     Please contact patient with results of her urine culture showing E Coli.   I recommend Levofloxacin 250 mg po q day for 3 days.   The E Coli is resistant to many antibiotics including the Nitrofurantoin that she takes for UTI prophylaxis.   I recommend she does have follow up with urology.

## 2020-04-17 NOTE — Telephone Encounter (Signed)
I contacted a [facility] pharmacist who reviewed the antibiotic choices with me.  He recommended Cefdinir 300 mg po bid x 5 days.  This is a third generation cephalosporin, which is slightly different from the last oral antibiotic she took.   We will not use Levaquin at this time.  Not sure exactly what the swelling was with the use of Ciprofloxacin, but we have another oral antibiotic to choose from.

## 2020-04-21 NOTE — Progress Notes (Signed)
GYNECOLOGY  VISIT   HPI: 86 y.o.   Married  Caucasian  female   [phone] with Patient's last menstrual period was 09/12/1973 (approximate).   here for pessary check.   Patient seen on 04/14/20 for urinary retention with her new small pessary and was dx with E Coli UTI, resistant to multiple abx. She was tx with Cefdinir. States she does not really feel any different.   Her last two UTI were resistant to TMP/SMX and Nitrofurantoin.   She had a hx of elbow swelling when she took Ciprofloxacin in the past.  She does not remember if she had pain with this.   She has not seen urology yet.   Ultimately, her larger, #7 pessary was placed back. This am she noticed some bleeding after she took out her pessary.   GYNECOLOGIC HISTORY: Patient's last menstrual period was 09/12/1973 (approximate). Contraception:  PMP Menopausal hormone therapy: None Last mammogram: hormone therapy:  None Last mammogram: 11-20-19 Unilateral Lt.Br./masses and asymmetry/density C--Lt.Br.US and Bx revealsGRADE III INVASIVE DUCTAL CARCINOMA. DUCTAL CARCINOMA IN SITU of the LEFT breast, 11 o'clock, upper inner,ribbon clip. Last pap smear: 03-11-19 Neg,11-25-15 Neg        OB History    Gravida  3   Para  3   Term  3   Preterm      AB      Living  3     SAB      TAB      Ectopic      Multiple      Live Births                 Patient Active Problem List   Diagnosis Date Noted  . Breast cancer, left breast ([place]) 01/22/2020  . Malignant neoplasm of upper-inner quadrant of left breast in female, estrogen receptor negative ([place]) 12/20/2019  . Malignant neoplasm of overlapping sites of right breast in female, estrogen receptor positive ([place]) 08/14/2019  . Osteopenia 08/14/2019  . Diplopia 01/29/2016  . GERD (gastroesophageal reflux disease) 01/29/2016  . Cerebrovascular accident (CVA) due to embolism of left middle cerebral artery ([place]) 01/29/2016  . Persistent atrial fibrillation ([place])   .  Bleeding gastrointestinal   . Gait disturbance, post-stroke 01/27/2016  . Fall   . Secondary hypertension, unspecified   . Cerebral infarction due to thrombosis of left middle cerebral artery (HCC) s/p mechanical thrombectomy   . Malnutrition of moderate degree 01/09/2016  . GI bleed 01/06/2016  . Chronic atrial fibrillation ([place]) 06/10/2015  . Chronic anticoagulation 06/10/2015  . Hyperlipidemia 06/10/2015  . Essential hypertension 06/10/2015  . History of stroke 06/10/2015  . Coronary artery disease due to lipid rich plaque 06/10/2015  . Afib ([place])   . Elevated uric acid in blood   . Arthritis of ankle   . Pulmonary HTN ([place])     Past Medical History:  Diagnosis Date  . Afib ([place])   . Arthritis of ankle   . CAD (coronary artery disease)   . Cancer [facility]) 2012   breast cancer-right  . Compression fracture of T12 vertebra (HCC)   . Diverticulosis   . Edema   . Elevated uric acid in blood   . GERD (gastroesophageal reflux disease)   . GI bleed 12/2015  . Hyperlipidemia   . Hypertension   . Long-term (current) use of anticoagulants   . Moderate mitral regurgitation   . Pulmonary HTN ([place])    Echo 1/19: EF 60-65, no RWMA, Ao sclerosis, trivial AI,  mild MR, mod BAE, mod TR, PASP 49  . Stroke ([place]) 01/25/2016  . TIA (transient ischemic attack) 09/2011  . Urinary incontinence   . Urinary retention     Past Surgical History:  Procedure Laterality Date  . APPENDECTOMY    . AXILLARY SENTINEL NODE BIOPSY Left 01/22/2020   Procedure: Left Axillary Sentinel Node Biopsy;  Surgeon: Rolm Bookbinder, MD;  Location: [facility];  Service: General;  Laterality: Left;  . BREAST LUMPECTOMY WITH RADIOACTIVE SEED AND SENTINEL LYMPH NODE BIOPSY Left 01/22/2020   Procedure: LEFT BREAST LUMPECTOMY WITH RADIOACTIVE SEED;  Surgeon: Rolm Bookbinder, MD;  Location: [place];  Service: General;  Laterality: Left;  . BREAST SURGERY  2012   Rt.mastectomy--[place], [place]  2013   . CATARACT EXTRACTION, BILATERAL    . DILATION AND CURETTAGE OF UTERUS     in her early 50's for DUB  . ESOPHAGOGASTRODUODENOSCOPY Left 01/08/2016   Procedure: ESOPHAGOGASTRODUODENOSCOPY (EGD);  Surgeon: Arta Silence, MD;  Location[facility];  Service: Endoscopy;  Laterality: Left;  . GALLBLADDER SURGERY    . MASTECTOMY Right   . RADIOLOGY WITH ANESTHESIA N/A 01/25/2016   Procedure: RADIOLOGY WITH ANESTHESIA;  Surgeon: Luanne Bras, MD;  Location: [facility];  Service: Radiology;  Laterality: N/A;  . REPLACEMENT TOTAL KNEE BILATERAL    . TONSILLECTOMY    . TONSILLECTOMY AND ADENOIDECTOMY     as a child    Current Outpatient Medications  Medication Sig Dispense Refill  . acetaminophen (TYLENOL) 500 MG tablet Take 500-1,000 mg by mouth every 12 (twelve) hours as needed (pain).     Marland Kitchen amoxicillin (AMOXIL) 500 MG capsule Take 2,000 mg by mouth See admin instructions. Take 4 capsules (2000 mg) by mouth 1 hour prior to dental appointments.    . Ascorbic Acid (VITAMIN C WITH ROSE HIPS) 500 MG tablet Take 500 mg by mouth in the morning and at bedtime.    Marland Kitchen atorvastatin (LIPITOR) 10 MG tablet TAKE 1 TABLET DAILY (Patient taking differently: Take 10 mg by mouth daily in the afternoon. (1600)) 90 tablet 3  . Calcium Citrate-Vitamin D (SM CALCIUM CITRATE+D3 PETITE PO) Take 1 tablet by mouth in the morning, at noon, and at bedtime.    . cefaclor (CECLOR) 250 MG capsule Take 1 capsule (250 mg total) by mouth 3 (three) times daily. 15 capsule 0  . diltiazem (CARDIZEM) 60 MG tablet TAKE 1 TABLET THREE TIMES A DAY (Patient taking differently: Take 60 mg by mouth in the morning, at noon, and at bedtime. ) 270 tablet 3  . ELIQUIS 5 MG TABS tablet TAKE 1 TABLET TWICE A DAY (Patient taking differently: Take 5 mg by mouth 2 (two) times daily. ) 180 tablet 1  . furosemide (LASIX) 20 MG tablet Take 1 tablet (20 mg total) by mouth daily. 90 tablet 3  . metoprolol tartrate (LOPRESSOR) 100 MG tablet TAKE 1  TABLET TWICE A DAY (Patient taking differently: Take 100 mg by mouth in the morning and at bedtime. ) 180 tablet 3  . Multiple Vitamin (MULTIVITAMIN WITH MINERALS) TABS tablet Take 1 tablet by mouth daily. Centrum for Women 50+    . nitrofurantoin (MACRODANTIN) 100 MG capsule Take 100 mg by mouth at bedtime.     Marland Kitchen omeprazole (PRILOSEC) 10 MG capsule Take 10 mg by mouth daily before breakfast.     . saccharomyces boulardii (FLORASTOR) 250 MG capsule Take 250 mg by mouth 2 (two) times daily.     No current  facility-administered medications for this visit.     ALLERGIES: Valium [diazepam], Amiodarone hcl [amiodarone], Compazine [prochlorperazine], Other, Thorazine [chlorpromazine], Zometa [zoledronic acid], and Ciprofloxacin  Family History  Problem Relation Age of Onset  . Asthma Mother   . CVA Father   . Atrial fibrillation Sister        HAS PACER  . Pneumonia Brother   . Cancer Daughter        COLON CANCER  . Cancer Maternal Grandmother        breast cancer    Social History   Socioeconomic History  . Marital status: Married    Spouse name: Not on file  . Number of children: Not on file  . Years of education: Not on file  . Highest education level: Not on file  Occupational History  . Not on file  Tobacco Use  . Smoking status: Former Smoker    Packs/day: 0.25    Years: 2.00    Pack years: 0.50    Types: Cigarettes    Quit date: 1950    Years since quitting: 71.6  . Smokeless tobacco: Never Used  Vaping Use  . Vaping Use: Never used  Substance and Sexual Activity  . Alcohol use: No    Alcohol/week: 0.0 standard drinks  . Drug use: No  . Sexual activity: Not Currently    Partners: Male    Birth control/protection: Post-menopausal  Other Topics Concern  . Not on file  Social History Narrative  . Not on file   Social Determinants of Health   Financial Resource Strain:   . Difficulty of Paying Living Expenses:   Food Insecurity:   . Worried About Ship broker in the Last Year:   . Arboriculturist in the Last Year:   Transportation Needs:   . Film/video editor (Medical):   Marland Kitchen Lack of Transportation (Non-Medical):   Physical Activity:   . Days of Exercise per Week:   . Minutes of Exercise per Session:   Stress:   . Feeling of Stress :   Social Connections:   . Frequency of Communication with Friends and Family:   . Frequency of Social Gatherings with Friends and Family:   . Attends Religious Services:   . Active Member of Clubs or Organizations:   . Attends Archivist Meetings:   Marland Kitchen Marital Status:   Intimate Partner Violence:   . Fear of Current or Ex-Partner:   . Emotionally Abused:   Marland Kitchen Physically Abused:   . Sexually Abused:     Review of Systems  All other systems reviewed and are negative.   PHYSICAL EXAMINATION:    BP (!) 144/80 (Cuff Size: Large)   Pulse 76   Ht 5' 0.5" (1.537 m)   Wt 147 lb (66.7 kg)   LMP 09/12/1973 (Approximate)   BMI 28.24 kg/m     General appearance: alert, cooperative and appears stated age   Pelvic: External genitalia:  no lesions              Urethra:  normal appearing urethra with no masses, tenderness or lesions              Bartholins and Skenes: normal                 Vagina: normal appearing vagina with normal color and discharge, large granulation tissue of the left side of the vagina.  Cervix: no lesions.  Complete prolapse.                 Bimanual Exam:  Uterus:  normal size, contour, position, consistency, mobility, non-tender              Adnexa: no mass, fullness, tenderness   Pessary replaced for patient.        Chaperone was present for exam.  ASSESSMENT  Complete uterovaginal prolapse.  Vaginal granulation tissue.  Pessary maintenance.  Recurrent UTIs, resistant to Nitrofurantoin.   PLAN  Continue #7 pessary with support. Will facilitate an appointment with Dr. Matilde Sprang.  FU in 6 weeks for pessary check.

## 2020-04-27 ENCOUNTER — Ambulatory Visit (INDEPENDENT_AMBULATORY_CARE_PROVIDER_SITE_OTHER): Payer: Medicare Other | Admitting: Obstetrics and Gynecology

## 2020-04-27 ENCOUNTER — Telehealth: Payer: Self-pay | Admitting: Obstetrics and Gynecology

## 2020-04-27 ENCOUNTER — Encounter: Payer: Self-pay | Admitting: Obstetrics and Gynecology

## 2020-04-27 ENCOUNTER — Other Ambulatory Visit: Payer: Self-pay

## 2020-04-27 VITALS — BP 144/80 | HR 76 | Ht 60.5 in | Wt 147.0 lb

## 2020-04-27 DIAGNOSIS — N390 Urinary tract infection, site not specified: Secondary | ICD-10-CM

## 2020-04-27 DIAGNOSIS — N813 Complete uterovaginal prolapse: Secondary | ICD-10-CM

## 2020-04-27 DIAGNOSIS — Z4689 Encounter for fitting and adjustment of other specified devices: Secondary | ICD-10-CM | POA: Diagnosis not present

## 2020-04-27 DIAGNOSIS — I63412 Cerebral infarction due to embolism of left middle cerebral artery: Secondary | ICD-10-CM | POA: Diagnosis not present

## 2020-04-27 NOTE — Telephone Encounter (Signed)
Call placed to Dr Macdiarmid's office to schedule appt.  Pt scheduled with Bree, NP on 8/20 at 1245 pm. Dr Matilde Sprang does not have available appt until Oct.   Call placed to pt. Pt made aware of appt at [facility]. Pt agreeable and verbalized understanding.  Routing to Dr Quincy Simmonds for review and update.  Encounter closed.

## 2020-04-27 NOTE — Telephone Encounter (Signed)
Please make an appointment for patient to see Dr. Matilde Sprang for recurrent UTIs while on Nitrofurantoin.   The bacterial are resistant to Nitrofurantoin.   She is an established patient.

## 2020-04-29 NOTE — Progress Notes (Signed)
Radiation Oncology         ([phone] ________________________________  Name: Adrienne [name] MRN: [id number]  Date: 04/30/2020  DOB: April 05, 1928  Follow-Up Visit Note  CC: Kathyrn Lass, MD  Rolm Bookbinder, MD    ICD-10-CM   1. Malignant neoplasm of upper-inner quadrant of left breast in female, estrogen receptor negative ([place])  C50.212    Z17.1   2. Malignant neoplasm of areola of left breast in female, unspecified estrogen receptor status ([place])  C50.012     Diagnosis: StagepT1a,pN0 LeftBreastUIQ,Invasive DuctalCarcinoma with DCIS, ER-/ PR-/ Her2-, Grade3  Interval Since Last Radiation: One month and six days.  Radiation Treatment Dates: 03/02/2020 through 03/24/2020 Site Technique Total Dose (Gy) Dose per Fx (Gy) Completed Fx Beam Energies  Breast, Left: Breast_Lt 3D 42.72/42.72 2.67 16/16 6X, 15X    Narrative:  The patient returns today for routine follow-up. Since the end of treatment, the patient has been followed by Dr. Quincy Simmonds, OB-GYN, regarding vaginal bleeding/discomfort and UTI diagnosed on 03/27/2020. She has chronic granulation tissue from her #7 pessary ring with support. She had an E. Coli UTI while on Macrobid prophylaxis for UTI and was then switched from Bactrim DS to Ceclor and was inevitably treated with Cefdinir. She had a new pessary placed, after which she experienced some brown discharge following treatment with silver nitrate, pelvic pressure, and urinary retention. Given those side effects, her #7 pessary was placed back in. She was referred to Dr. Matilde Sprang for recurrent UTIs while on Nitrofurantoin.   On review of systems, she reports no complaints. She denies breast pain or itching within the skin of the breast.  She denies any cough or breathing problems  ALLERGIES:  is allergic to valium [diazepam], amiodarone hcl [amiodarone], compazine [prochlorperazine], other, thorazine [chlorpromazine], zometa [zoledronic acid], and  ciprofloxacin.  Meds: Current Outpatient Medications  Medication Sig Dispense Refill  . acetaminophen (TYLENOL) 500 MG tablet Take 500-1,000 mg by mouth every 12 (twelve) hours as needed (pain).     Marland Kitchen amoxicillin (AMOXIL) 500 MG capsule Take 2,000 mg by mouth See admin instructions. Take 4 capsules (2000 mg) by mouth 1 hour prior to dental appointments.    . Ascorbic Acid (VITAMIN C WITH ROSE HIPS) 500 MG tablet Take 500 mg by mouth in the morning and at bedtime.    Marland Kitchen atorvastatin (LIPITOR) 10 MG tablet TAKE 1 TABLET DAILY (Patient taking differently: Take 10 mg by mouth daily in the afternoon. (1600)) 90 tablet 3  . Calcium Citrate-Vitamin D (SM CALCIUM CITRATE+D3 PETITE PO) Take 1 tablet by mouth in the morning, at noon, and at bedtime.    . cefaclor (CECLOR) 250 MG capsule Take 1 capsule (250 mg total) by mouth 3 (three) times daily. 15 capsule 0  . diltiazem (CARDIZEM) 60 MG tablet TAKE 1 TABLET THREE TIMES A DAY (Patient taking differently: Take 60 mg by mouth in the morning, at noon, and at bedtime. ) 270 tablet 3  . ELIQUIS 5 MG TABS tablet TAKE 1 TABLET TWICE A DAY (Patient taking differently: Take 5 mg by mouth 2 (two) times daily. ) 180 tablet 1  . furosemide (LASIX) 20 MG tablet Take 1 tablet (20 mg total) by mouth daily. 90 tablet 3  . metoprolol tartrate (LOPRESSOR) 100 MG tablet TAKE 1 TABLET TWICE A DAY (Patient taking differently: Take 100 mg by mouth in the morning and at bedtime. ) 180 tablet 3  . Multiple Vitamin (MULTIVITAMIN WITH MINERALS) TABS tablet Take 1 tablet by mouth daily.  Centrum for Women 50+    . nitrofurantoin (MACRODANTIN) 100 MG capsule Take 100 mg by mouth at bedtime.     Marland Kitchen omeprazole (PRILOSEC) 10 MG capsule Take 10 mg by mouth daily before breakfast.     . saccharomyces boulardii (FLORASTOR) 250 MG capsule Take 250 mg by mouth 2 (two) times daily.     No current facility-administered medications for this encounter.    Physical Findings: The patient is in  no acute distress. Patient is alert and oriented.  height is 5' (1.524 m) and weight is 150 lb (68 kg). Her temperature is 98 F (36.7 C). Her blood pressure is 145/52 (abnormal) and her pulse is 61. Her respiration is 18 and oxygen saturation is 98%.   No significant changes. Lungs are clear to auscultation bilaterally. Heart has regular rate and rhythm. No palpable cervical, supraclavicular, or axillary adenopathy. Abdomen soft, non-tender, normal bowel sounds. Right breast: No palpable mass, nipple discharge, or bleeding.  Left breast: Skin is healed well.  Hyperpigmentation changes noted.  Patient continues to have some induration in the breast consistent with persistent hematoma, but overall decreased.  Lab Findings: Lab Results  Component Value Date   WBC 12.1 (H) 01/14/2020   HGB 13.1 01/14/2020   HCT 41.5 01/14/2020   MCV 95.2 01/14/2020   PLT 245 01/14/2020    Radiographic Findings: No results found.  Impression: StagepT1a,pN0 LeftBreastUIQ,Invasive DuctalCarcinoma with DCIS, ER-/ PR-/ Her2-, Grade3  The patient is recovering from the effects of radiation.  Overall she seems to have tolerated her treatments well given her age of 63.  Plan: The patient is scheduled to follow-up with Dr. Jana Hakim on 05/12/2020. She will continue to see Dr. Quincy Simmonds regarding her GYN symptoms. She will follow-up with radiation oncology on a as needed basis.    ____________________________________   Blair Promise, PhD, MD  This document serves as a record of services personally performed by Gery Pray, MD. It was created on his behalf by Clerance Lav, a trained medical scribe. The creation of this record is based on the scribe's personal observations and the provider's statements to them. This document has been checked and approved by the attending provider.

## 2020-04-30 ENCOUNTER — Encounter: Payer: Self-pay | Admitting: Radiation Oncology

## 2020-04-30 ENCOUNTER — Other Ambulatory Visit: Payer: Self-pay

## 2020-04-30 ENCOUNTER — Ambulatory Visit
Admission: RE | Admit: 2020-04-30 | Discharge: 2020-04-30 | Disposition: A | Discharge status: Home / Self Care | Payer: Medicare Other | Source: Ambulatory Visit | Attending: Radiation Oncology | Admitting: Radiation Oncology

## 2020-04-30 VITALS — BP 145/52 | HR 61 | Temp 98.0°F | Resp 18 | Ht 60.0 in | Wt 150.0 lb

## 2020-04-30 DIAGNOSIS — C50212 Malignant neoplasm of upper-inner quadrant of left female breast: Secondary | ICD-10-CM | POA: Insufficient documentation

## 2020-04-30 DIAGNOSIS — Z171 Estrogen receptor negative status [ER-]: Secondary | ICD-10-CM | POA: Insufficient documentation

## 2020-04-30 DIAGNOSIS — Z923 Personal history of irradiation: Secondary | ICD-10-CM | POA: Insufficient documentation

## 2020-04-30 DIAGNOSIS — Z79899 Other long term (current) drug therapy: Secondary | ICD-10-CM | POA: Diagnosis not present

## 2020-04-30 DIAGNOSIS — C50012 Malignant neoplasm of nipple and areola, left female breast: Secondary | ICD-10-CM

## 2020-04-30 DIAGNOSIS — Z7901 Long term (current) use of anticoagulants: Secondary | ICD-10-CM | POA: Diagnosis not present

## 2020-04-30 NOTE — Progress Notes (Addendum)
Patient here for a one month f/u visit with Dr. Sondra Come.  Patient denies pain or skin changes in her left breast.   BP (!) 145/52 (BP Location: Right Arm, Patient Position: Sitting, Cuff Size: Normal)   Pulse 61   Temp 98 F (36.7 C)   Resp 18   Ht 5' (1.524 m)   Wt 150 lb (68 kg)   LMP 09/12/1973 (Approximate)   SpO2 98%   BMI 29.29 kg/m   Wt Readings from Last 3 Encounters:  04/30/20 150 lb (68 kg)  04/27/20 147 lb (66.7 kg)  04/14/20 147 lb (66.7 kg)   RN assisted patient's husband in changing her chest wound dressing using 3 x 8 telfas and Kerlix. Patient denies any pain at her chest at present.

## 2020-05-01 DIAGNOSIS — N3020 Other chronic cystitis without hematuria: Secondary | ICD-10-CM | POA: Diagnosis not present

## 2020-05-05 DIAGNOSIS — L821 Other seborrheic keratosis: Secondary | ICD-10-CM | POA: Diagnosis not present

## 2020-05-06 ENCOUNTER — Ambulatory Visit: Payer: Medicare Other | Admitting: Primary Care

## 2020-05-06 NOTE — Progress Notes (Deleted)
@Patient  ID: Adrienne [name], female    DOB: 1927/10/19, 86 y.o.   MRN: [id number]  No chief complaint on file.   Referring provider: Kathyrn Lass, MD  HPI: 86 year old female, former smoker quit in 1950. PMH significant for cough, multiple lung nodules. Patient of Dr. Valeta Harms.   05/06/2020 Patient presents today for cough.      Allergies  Allergen Reactions  . Valium [Diazepam] Other (See Comments)    HYPERACTIVITY   . Amiodarone Hcl [Amiodarone] Other (See Comments)    AFIB  . Compazine [Prochlorperazine] Other (See Comments)    HYPERACTIVITY   . Other Other (See Comments)    "Kidney dye"--patient states this gave her severe shakes/chills  . Thorazine [Chlorpromazine] Other (See Comments)    HYPERACTIVITY   . Zometa [Zoledronic Acid] Other (See Comments)    PROBLEMS WITH EYES  TIA  . Ciprofloxacin Swelling    "swelling" in Rt.elbow    Immunization History  Administered Date(s) Administered  . Influenza,inj,Quad PF,6+ Mos 06/16/2019  . PFIZER SARS-COV-2 Vaccination 10/17/2019, 11/12/2019  . Zoster Recombinat (Shingrix) 09/18/2017, 02/19/2018    Past Medical History:  Diagnosis Date  . Afib ([place])   . Arthritis of ankle   . CAD (coronary artery disease)   . Cancer [facility]) 2012   breast cancer-right  . Compression fracture of T12 vertebra (HCC)   . Diverticulosis   . Edema   . Elevated uric acid in blood   . GERD (gastroesophageal reflux disease)   . GI bleed 12/2015  . Hyperlipidemia   . Hypertension   . Long-term (current) use of anticoagulants   . Moderate mitral regurgitation   . Pulmonary HTN ([place])    Echo 1/19: EF 60-65, no RWMA, Ao sclerosis, trivial AI, mild MR, mod BAE, mod TR, PASP 49  . Stroke ([place]) 01/25/2016  . TIA (transient ischemic attack) 09/2011  . Urinary incontinence   . Urinary retention     Tobacco History: Social History   Tobacco Use  Smoking Status Former Smoker  . Packs/day: 0.25  . Years: 2.00  . Pack years:  0.50  . Types: Cigarettes  . Quit date: 13  . Years since quitting: 71.6  Smokeless Tobacco Never Used   Counseling given: Not Answered   Outpatient Medications Prior to Visit  Medication Sig Dispense Refill  . acetaminophen (TYLENOL) 500 MG tablet Take 500-1,000 mg by mouth every 12 (twelve) hours as needed (pain).     Marland Kitchen amoxicillin (AMOXIL) 500 MG capsule Take 2,000 mg by mouth See admin instructions. Take 4 capsules (2000 mg) by mouth 1 hour prior to dental appointments.    . Ascorbic Acid (VITAMIN C WITH ROSE HIPS) 500 MG tablet Take 500 mg by mouth in the morning and at bedtime.    Marland Kitchen atorvastatin (LIPITOR) 10 MG tablet TAKE 1 TABLET DAILY (Patient taking differently: Take 10 mg by mouth daily in the afternoon. (1600)) 90 tablet 3  . Calcium Citrate-Vitamin D (SM CALCIUM CITRATE+D3 PETITE PO) Take 1 tablet by mouth in the morning, at noon, and at bedtime.    . cefaclor (CECLOR) 250 MG capsule Take 1 capsule (250 mg total) by mouth 3 (three) times daily. 15 capsule 0  . diltiazem (CARDIZEM) 60 MG tablet TAKE 1 TABLET THREE TIMES A DAY (Patient taking differently: Take 60 mg by mouth in the morning, at noon, and at bedtime. ) 270 tablet 3  . ELIQUIS 5 MG TABS tablet TAKE 1 TABLET TWICE A DAY (Patient  taking differently: Take 5 mg by mouth 2 (two) times daily. ) 180 tablet 1  . furosemide (LASIX) 20 MG tablet Take 1 tablet (20 mg total) by mouth daily. 90 tablet 3  . metoprolol tartrate (LOPRESSOR) 100 MG tablet TAKE 1 TABLET TWICE A DAY (Patient taking differently: Take 100 mg by mouth in the morning and at bedtime. ) 180 tablet 3  . Multiple Vitamin (MULTIVITAMIN WITH MINERALS) TABS tablet Take 1 tablet by mouth daily. Centrum for Women 50+    . nitrofurantoin (MACRODANTIN) 100 MG capsule Take 100 mg by mouth at bedtime.     Marland Kitchen omeprazole (PRILOSEC) 10 MG capsule Take 10 mg by mouth daily before breakfast.     . saccharomyces boulardii (FLORASTOR) 250 MG capsule Take 250 mg by mouth 2  (two) times daily.     No facility-administered medications prior to visit.      Review of Systems  Review of Systems   Physical Exam  LMP 09/12/1973 (Approximate)  Physical Exam   Lab Results:  CBC    Component Value Date/Time   WBC 12.1 (H) 01/14/2020 1038   RBC 4.36 01/14/2020 1038   HGB 13.1 01/14/2020 1038   HGB 12.4 05/06/2019 1608   HCT 41.5 01/14/2020 1038   HCT 36.6 05/06/2019 1608   PLT 245 01/14/2020 1038   PLT 237 05/06/2019 1608   MCV 95.2 01/14/2020 1038   MCV 92 05/06/2019 1608   MCH 30.0 01/14/2020 1038   MCHC 31.6 01/14/2020 1038   RDW 13.2 01/14/2020 1038   RDW 12.7 05/06/2019 1608   LYMPHSABS 1.3 12/20/2019 1010   MONOABS 0.8 12/20/2019 1010   EOSABS 0.1 12/20/2019 1010   BASOSABS 0.0 12/20/2019 1010    BMET    Component Value Date/Time   NA 138 01/14/2020 1038   NA 145 (H) 05/06/2019 1608   K 4.1 01/14/2020 1038   CL 100 01/14/2020 1038   CO2 28 01/14/2020 1038   GLUCOSE 102 (H) 01/14/2020 1038   BUN 18 01/14/2020 1038   BUN 19 05/06/2019 1608   CREATININE 1.15 (H) 01/14/2020 1038   CALCIUM 9.9 01/14/2020 1038   GFRNONAA 42 (L) 01/14/2020 1038   GFRAA 49 (L) 01/14/2020 1038    BNP    Component Value Date/Time   BNP 258.2 (H) 07/19/2016 1531    ProBNP    Component Value Date/Time   PROBNP 2,211 (H) 05/06/2019 1608    Imaging: No results found.   Assessment & Plan:   No problem-specific Assessment & Plan notes found for this encounter.     Martyn Ehrich, NP 05/06/2020

## 2020-05-12 ENCOUNTER — Ambulatory Visit: Payer: Medicare Other | Admitting: Pulmonary Disease

## 2020-05-12 ENCOUNTER — Other Ambulatory Visit: Payer: Self-pay

## 2020-05-12 ENCOUNTER — Inpatient Hospital Stay: Payer: Medicare Other

## 2020-05-12 ENCOUNTER — Inpatient Hospital Stay: Payer: Medicare Other | Attending: Oncology | Admitting: Oncology

## 2020-05-12 VITALS — BP 143/67 | HR 60 | Temp 98.1°F | Resp 17 | Ht 60.0 in | Wt 147.2 lb

## 2020-05-12 DIAGNOSIS — Z170 Estrogen receptor positive status [ER+]: Secondary | ICD-10-CM

## 2020-05-12 DIAGNOSIS — C50811 Malignant neoplasm of overlapping sites of right female breast: Secondary | ICD-10-CM

## 2020-05-12 DIAGNOSIS — Z171 Estrogen receptor negative status [ER-]: Secondary | ICD-10-CM | POA: Diagnosis not present

## 2020-05-12 DIAGNOSIS — C50212 Malignant neoplasm of upper-inner quadrant of left female breast: Secondary | ICD-10-CM | POA: Diagnosis not present

## 2020-05-12 DIAGNOSIS — Z87891 Personal history of nicotine dependence: Secondary | ICD-10-CM | POA: Diagnosis not present

## 2020-05-12 DIAGNOSIS — Z853 Personal history of malignant neoplasm of breast: Secondary | ICD-10-CM | POA: Diagnosis not present

## 2020-05-12 DIAGNOSIS — Z923 Personal history of irradiation: Secondary | ICD-10-CM | POA: Insufficient documentation

## 2020-05-12 DIAGNOSIS — R269 Unspecified abnormalities of gait and mobility: Secondary | ICD-10-CM

## 2020-05-12 DIAGNOSIS — I4819 Other persistent atrial fibrillation: Secondary | ICD-10-CM

## 2020-05-12 DIAGNOSIS — I63412 Cerebral infarction due to embolism of left middle cerebral artery: Secondary | ICD-10-CM

## 2020-05-12 DIAGNOSIS — M8580 Other specified disorders of bone density and structure, unspecified site: Secondary | ICD-10-CM

## 2020-05-12 LAB — CBC WITH DIFFERENTIAL/PLATELET
Abs Immature Granulocytes: 0.02 10*3/uL (ref 0.00–0.07)
Basophils Absolute: 0 10*3/uL (ref 0.0–0.1)
Basophils Relative: 0 %
Eosinophils Absolute: 0.1 10*3/uL (ref 0.0–0.5)
Eosinophils Relative: 1 %
HCT: 37.8 % (ref 36.0–46.0)
Hemoglobin: 12.3 g/dL (ref 12.0–15.0)
Immature Granulocytes: 0 %
Lymphocytes Relative: 13 %
Lymphs Abs: 1.3 10*3/uL (ref 0.7–4.0)
MCH: 30 pg (ref 26.0–34.0)
MCHC: 32.5 g/dL (ref 30.0–36.0)
MCV: 92.2 fL (ref 80.0–100.0)
Monocytes Absolute: 0.9 10*3/uL (ref 0.1–1.0)
Monocytes Relative: 9 %
Neutro Abs: 7.4 10*3/uL (ref 1.7–7.7)
Neutrophils Relative %: 77 %
Platelets: 287 10*3/uL (ref 150–400)
RBC: 4.1 MIL/uL (ref 3.87–5.11)
RDW: 12.9 % (ref 11.5–15.5)
WBC: 9.8 10*3/uL (ref 4.0–10.5)
nRBC: 0 % (ref 0.0–0.2)

## 2020-05-12 LAB — COMPREHENSIVE METABOLIC PANEL
ALT: 8 U/L (ref 0–44)
AST: 12 U/L — ABNORMAL LOW (ref 15–41)
Albumin: 3.5 g/dL (ref 3.5–5.0)
Alkaline Phosphatase: 94 U/L (ref 38–126)
Anion gap: 8 (ref 5–15)
BUN: 19 mg/dL (ref 8–23)
CO2: 26 mmol/L (ref 22–32)
Calcium: 10.9 mg/dL — ABNORMAL HIGH (ref 8.9–10.3)
Chloride: 102 mmol/L (ref 98–111)
Creatinine, Ser: 1.11 mg/dL — ABNORMAL HIGH (ref 0.44–1.00)
GFR calc Af Amer: 51 mL/min — ABNORMAL LOW (ref >60)
GFR calc non Af Amer: 44 mL/min — ABNORMAL LOW (ref >60)
Glucose, Bld: 139 mg/dL — ABNORMAL HIGH (ref 70–99)
Potassium: 4.6 mmol/L (ref 3.5–5.1)
Sodium: 136 mmol/L (ref 135–145)
Total Bilirubin: 0.6 mg/dL (ref 0.3–1.2)
Total Protein: 7.5 g/dL (ref 6.5–8.1)

## 2020-05-12 NOTE — Progress Notes (Signed)
[place]  [phone] [phone]     ID: Adrienne [name] DOB: Oct 16, 1927  MR#: [id number]  OEV#:[id number]  Patient Care Team: Kathyrn Lass, MD as PCP - General (Family Medicine) Yisroel Ramming, Everardo All, MD as Consulting Physician (Obstetrics and Gynecology) Oday Ridings, Virgie Dad, MD as Consulting Physician (Oncology) Jerline Pain, MD as Consulting Physician (Cardiology) Sydnee Levans, MD as Referring Physician (Dermatology) Garner Nash, DO as Consulting Physician (Pulmonary Disease) Rolm Bookbinder, MD as Consulting Physician (General Surgery) Gery Pray, MD as Consulting Physician (Radiation Oncology) Chauncey Cruel, MD OTHER MD:  CHIEF COMPLAINT: Bilateral breast cancer,  estrogen receptor positive (s/p right mastectomy)  CURRENT TREATMENT: Observation   INTERVAL HISTORY: [name] returns today for follow up of her newly diagnosed left breast cancer accompanied by her husband.   Since her last visit, she underwent left lumpectomy on 01/22/2020 under Dr. Donne Hazel. Pathology from the procedure ([id number]) showed: invasive ductal carcinoma, 0.3 cm; ductal carcinoma in situ, 1.5 cm.  Both biopsied lymph nodes were negative for carcinoma (0/2).  She was referred back to Dr. Sondra Come on 02/12/2020 to discuss radiation therapy. She received treatment from 03/02/2020 through 03/24/2020   REVIEW OF SYSTEMS: [name] tolerated her radiation treatments remarkably well.  She had minimal skin peeling which has resolved.  She feels moderately tired but was able to continue her activities of daily living.  Currently she denies unusual headaches visual changes cough phlegm production pleurisy shortness of breath or any change in bowel or bladder habits.  Detailed review of systems was otherwise stable.   RIGHT BREAST CANCER HISTORY: From the original intake note:  Adrienne [name] has a right breast mass noted by her primary care  doctor in [place] in 2012.  She was referred to surgery and underwent right mastectomy and axillary lymph node dissection or sampling (we do not have the pathology report) 06/04/2011 for what appears to have been a multicentric invasive ductal carcinoma, grade 2.  There were three separate tumor foci, spanning a total of 3.8 cm. Prognostic indicators were: ER positive, PR positive, and Her2 negative. One out of 3 biopsied lymph nodes was positive for micrometastasis.   LEFT BREAST CANCER HISTORY: [name] had routine left screening mammography on 11/19/2019 showing a possible abnormality. She underwent left diagnostic mammography with tomography and left breast ultrasonography at [facility] on 12/05/2019 showing: breast density category C; 0.7 cm left breast mass at 11 o'clock; development of three left breast cysts compared to prior imaging from [place] in 11/2017; no left axillary adenopathy.  Accordingly on 12/10/2019 she proceeded to biopsy of the left breast area in question. The pathology from this procedure ([id number]) showed: invasive ductal carcinoma, grade 3; ductal carcinoma in situ. Prognostic indicators significant for: estrogen receptor, 0% negative and progesterone receptor, 0% negative. Proliferation marker Ki67 at 90%. HER2 negative by immunohistochemistry (0).  The patient's subsequent history is as detailed below.   PAST MEDICAL HISTORY: Past Medical History:  Diagnosis Date  . Afib ([place])   . Arthritis of ankle   . CAD (coronary artery disease)   . Cancer [facility]) 2012   breast cancer-right  . Compression fracture of T12 vertebra (HCC)   . Diverticulosis   . Edema   . Elevated uric acid in blood   . GERD (gastroesophageal reflux disease)   . GI bleed 12/2015  . Hyperlipidemia   . Hypertension   . Long-term (current) use of anticoagulants   . Moderate mitral regurgitation   .  Pulmonary HTN ([place])    Echo 1/19: EF 60-65, no RWMA, Ao sclerosis, trivial AI, mild  MR, mod BAE, mod TR, PASP 49  . Stroke ([place]) 01/25/2016  . TIA (transient ischemic attack) 09/2011  . Urinary incontinence   . Urinary retention     PAST SURGICAL HISTORY: Past Surgical History:  Procedure Laterality Date  . APPENDECTOMY    . AXILLARY SENTINEL NODE BIOPSY Left 01/22/2020   Procedure: Left Axillary Sentinel Node Biopsy;  Surgeon: Rolm Bookbinder, MD;  Location: [facility];  Service: General;  Laterality: Left;  . BREAST LUMPECTOMY WITH RADIOACTIVE SEED AND SENTINEL LYMPH NODE BIOPSY Left 01/22/2020   Procedure: LEFT BREAST LUMPECTOMY WITH RADIOACTIVE SEED;  Surgeon: Rolm Bookbinder, MD;  Location: [facility];  Service: General;  Laterality: Left;  . BREAST SURGERY  2012   Rt.mastectomy--[place], [place]  2013  . CATARACT EXTRACTION, BILATERAL    . DILATION AND CURETTAGE OF UTERUS     in her early 89's for DUB  . ESOPHAGOGASTRODUODENOSCOPY Left 01/08/2016   Procedure: ESOPHAGOGASTRODUODENOSCOPY (EGD);  Surgeon: Arta Silence, MD;  Location: [facility];  Service: Endoscopy;  Laterality: Left;  . GALLBLADDER SURGERY    . MASTECTOMY Right   . RADIOLOGY WITH ANESTHESIA N/A 01/25/2016   Procedure: RADIOLOGY WITH ANESTHESIA;  Surgeon: Luanne Bras, MD;  Location: [facility];  Service: Radiology;  Laterality: N/A;  . REPLACEMENT TOTAL KNEE BILATERAL    . TONSILLECTOMY    . TONSILLECTOMY AND ADENOIDECTOMY     as a child    FAMILY HISTORY: Family History  Problem Relation Age of Onset  . Asthma Mother   . CVA Father   . Atrial fibrillation Sister        HAS PACER  . Pneumonia Brother   . Cancer Daughter        COLON CANCER  . Cancer Maternal Grandmother        breast cancer   Patient's father was 60 years old when he died from stroke. Patient's mother died from coronary artery disease at age 77. The patient denies a family hx of ovarian cancer. She reports breast cancer in her maternal grandmother. She has 2 siblings, 1 brother and 1 sister. Her  brother died from stroke/AIDS. She also reports pancreatic cancer in her paternal grandmother and colon cancer in her daughter.   GYNECOLOGIC HISTORY:  Patient's last menstrual period was 09/12/1973 (approximate). Menarche: 86 years old Age at first live birth: 86 years old [place] P 3 LMP late 40s Contraceptive never HRT yes, remote , less than 6 months Hysterectomy?  No BSO?  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  Her husband [name] is a Marketing executive, (PhD, worked at [employer]).  Their children are Lockie Pares who lives in [place] and whose husband is a English as a second language teacher; Blair [employer]") who lives in [place] and works in finances; and Coin, who lives in [place] and works for [employer].  The patient has 11 grandchildren and 8 great-grandchildren.  She attends [employer] locally    ADVANCED DIRECTIVES: In the absence of documents to the contrary the patient's husband is her healthcare power of attorney   HEALTH MAINTENANCE: Social History   Tobacco Use  . Smoking status: Former Smoker    Packs/day: 0.25    Years: 2.00    Pack years: 0.50    Types: Cigarettes  Quit date: 41    Years since quitting: 71.7  . Smokeless tobacco: Never Used  Vaping Use  . Vaping Use: Never used  Substance Use Topics  . Alcohol use: No    Alcohol/week: 0.0 standard drinks  . Drug use: No     Colonoscopy: Remote  PAP: 11/2015, negative  Bone density: Osteopenia   Allergies  Allergen Reactions  . Valium [Diazepam] Other (See Comments)    HYPERACTIVITY   . Amiodarone Hcl [Amiodarone] Other (See Comments)    AFIB  . Compazine [Prochlorperazine] Other (See Comments)    HYPERACTIVITY   . Other Other (See Comments)    "Kidney dye"--patient states this gave her severe shakes/chills  . Thorazine [Chlorpromazine] Other (See Comments)    HYPERACTIVITY   . Zometa [Zoledronic  Acid] Other (See Comments)    PROBLEMS WITH EYES  TIA  . Ciprofloxacin Swelling    "swelling" in Rt.elbow    Current Outpatient Medications  Medication Sig Dispense Refill  . acetaminophen (TYLENOL) 500 MG tablet Take 500-1,000 mg by mouth every 12 (twelve) hours as needed (pain).     Marland Kitchen amoxicillin (AMOXIL) 500 MG capsule Take 2,000 mg by mouth See admin instructions. Take 4 capsules (2000 mg) by mouth 1 hour prior to dental appointments.    . Ascorbic Acid (VITAMIN C WITH ROSE HIPS) 500 MG tablet Take 500 mg by mouth in the morning and at bedtime.    Marland Kitchen atorvastatin (LIPITOR) 10 MG tablet TAKE 1 TABLET DAILY (Patient taking differently: Take 10 mg by mouth daily in the afternoon. (1600)) 90 tablet 3  . Calcium Citrate-Vitamin D (SM CALCIUM CITRATE+D3 PETITE PO) Take 1 tablet by mouth in the morning, at noon, and at bedtime.    . cefaclor (CECLOR) 250 MG capsule Take 1 capsule (250 mg total) by mouth 3 (three) times daily. 15 capsule 0  . diltiazem (CARDIZEM) 60 MG tablet TAKE 1 TABLET THREE TIMES A DAY (Patient taking differently: Take 60 mg by mouth in the morning, at noon, and at bedtime. ) 270 tablet 3  . ELIQUIS 5 MG TABS tablet TAKE 1 TABLET TWICE A DAY (Patient taking differently: Take 5 mg by mouth 2 (two) times daily. ) 180 tablet 1  . furosemide (LASIX) 20 MG tablet Take 1 tablet (20 mg total) by mouth daily. 90 tablet 3  . metoprolol tartrate (LOPRESSOR) 100 MG tablet TAKE 1 TABLET TWICE A DAY (Patient taking differently: Take 100 mg by mouth in the morning and at bedtime. ) 180 tablet 3  . Multiple Vitamin (MULTIVITAMIN WITH MINERALS) TABS tablet Take 1 tablet by mouth daily. Centrum for Women 50+    . nitrofurantoin (MACRODANTIN) 100 MG capsule Take 100 mg by mouth at bedtime.     Marland Kitchen omeprazole (PRILOSEC) 10 MG capsule Take 10 mg by mouth daily before breakfast.     . saccharomyces boulardii (FLORASTOR) 250 MG capsule Take 250 mg by mouth 2 (two) times daily.     No current  facility-administered medications for this visit.    OBJECTIVE: White woman in no acute distress  Vitals:   05/12/20 1307  BP: (!) 143/67  Pulse: 60  Resp: 17  Temp: 98.1 F (36.7 C)  SpO2: 95%     Body mass index is 28.75 kg/m.   Wt Readings from Last 3 Encounters:  05/12/20 147 lb 3.2 oz (66.8 kg)  04/30/20 150 lb (68 kg)  04/27/20 147 lb (66.7 kg)      ECOG FS:1 -  Symptomatic but completely ambulatory  Sclerae unicteric, EOMs intact Wearing a mask No cervical or supraclavicular adenopathy Lungs no rales or rhonchi Heart regular rate and rhythm Abd soft, nontender, positive bowel sounds MSK no focal spinal tenderness, no upper extremity lymphedema Neuro: nonfocal, well oriented, appropriate affect Breasts: Status post right mastectomy with no evidence of local recurrence.  Status post left lumpectomy and radiation.  There is minimal hyperpigmentation.  There is some coarsening of the skin as expected.  There is no evidence of local recurrence.  Both axillae are benign.  LAB RESULTS:  CMP     Component Value Date/Time   NA 136 05/12/2020 1240   NA 145 (H) 05/06/2019 1608   K 4.6 05/12/2020 1240   CL 102 05/12/2020 1240   CO2 26 05/12/2020 1240   GLUCOSE 139 (H) 05/12/2020 1240   BUN 19 05/12/2020 1240   BUN 19 05/06/2019 1608   CREATININE 1.11 (H) 05/12/2020 1240   CALCIUM 10.9 (H) 05/12/2020 1240   PROT 7.5 05/12/2020 1240   PROT 6.6 05/06/2019 1608   ALBUMIN 3.5 05/12/2020 1240   ALBUMIN 3.9 05/06/2019 1608   AST 12 (L) 05/12/2020 1240   ALT 8 05/12/2020 1240   ALKPHOS 94 05/12/2020 1240   BILITOT 0.6 05/12/2020 1240   BILITOT 0.4 05/06/2019 1608   GFRNONAA 44 (L) 05/12/2020 1240   GFRAA 51 (L) 05/12/2020 1240    No results found for: TOTALPROTELP, ALBUMINELP, A1GS, A2GS, BETS, BETA2SER, GAMS, MSPIKE, SPEI  No results found for: KPAFRELGTCHN, LAMBDASER, KAPLAMBRATIO  Lab Results  Component Value Date   WBC 9.8 05/12/2020   NEUTROABS 7.4  05/12/2020   HGB 12.3 05/12/2020   HCT 37.8 05/12/2020   MCV 92.2 05/12/2020   PLT 287 05/12/2020    No results found for: LABCA2  No components found for: BSJGGE366  No results for input(s): INR in the last 168 hours.  No results found for: LABCA2  No results found for: QHU765  No results found for: YYT035  No results found for: WSF681  No results found for: CA2729  No components found for: HGQUANT  No results found for: CEA1 / No results found for: CEA1   No results found for: AFPTUMOR  No results found for: CHROMOGRNA  No results found for: HGBA, HGBA2QUANT, HGBFQUANT, HGBSQUAN (Hemoglobinopathy evaluation)   No results found for: LDH  No results found for: IRON, TIBC, IRONPCTSAT (Iron and TIBC)  No results found for: FERRITIN  Urinalysis    Component Value Date/Time   COLORURINE YELLOW 08/29/2017 2343   APPEARANCEUR CLOUDY (A) 08/29/2017 2343   LABSPEC 1.005 08/29/2017 2343   PHURINE 6.0 08/29/2017 2343   GLUCOSEU NEGATIVE 08/29/2017 2343   HGBUR SMALL (A) 08/29/2017 2343   BILIRUBINUR n 04/14/2020 [place] 08/29/2017 2343   PROTEINUR Negative 04/14/2020 [place] 08/29/2017 2343   UROBILINOGEN 0.2 04/14/2020 1705   NITRITE n 04/14/2020 1705   NITRITE NEGATIVE 08/29/2017 2343   LEUKOCYTESUR Negative 04/14/2020 1705    STUDIES: No results found.   ELIGIBLE FOR AVAILABLE RESEARCH PROTOCOL: No  ASSESSMENT: 86 y.o. [place] woman   RIGHT BREAST CANCER (1) status post right mastectomy and sentinel lymph node sampling September 2012 for what appears to have been a multifocal T1-T2 N1 (mic) invasive ductal carcinoma, estrogen and progesterone receptor positive, HER-2 not amplified  (2) completed 5 years of anastrozole 2017  LEFT BREAST CANCER (3) status post left breast upper inner quadrant biopsy 12/10/2019 for a  clinical T1b N0, stage IB invasive ductal carcinoma grade 3, triple negative, with an MIB-1 of  90%  (4) status post left lumpectomy and sentinel lymph node sampling 01/22/2020 for a pT1a pN0, stage IA invasive ductal carcinoma, with negative margins  (a) ductal carcinoma in situ with necrosis also removed, all margins negative  (b) a total of 2 lymph nodes were removed  (5) adjuvant chemotherapy discussed, opted against  (6) meets criteria for genetics testing  (7) adjuvant radiation 03/02/2020 through 03/24/2020 Site Technique Total Dose (Gy) Dose per Fx (Gy) Completed Fx Beam Energies  Breast, Left: Breast_Lt 3D 42.72/42.72 2.67 16/16 6X, 15X    PLAN: Mortimer Fries has completed her local treatment and did very well with it.  She is not a candidate for systemic therapy as her invasive cancer was triple negative but also T1a and chemotherapy is not indicated in that setting.  She wanted to know which arm she should have blood drawn from or her blood pressure taken on.  We do not know how many lymph nodes were removed from her right arm.  However that was remote and I suggested for the time being she should offer her right arm for blood draws and blood pressure and spare the left.  She will resume mammography in April 2022 and see me in May of that year.  She knows to call for any other issues that may develop before that visit.  Total encounter time 30 minutes.Chauncey Cruel, MD   05/12/2020 6:03 PM Medical Oncology and Hematology [facility] [place], [place] [address] Tel. [phone]    Fax. [phone]   This document serves as a record of services personally performed by Lurline Del, MD. It was created on his behalf by Wilburn Mylar, a trained medical scribe. The creation of this record is based on the scribe's personal observations and the provider's statements to them.   I, Lurline Del MD, have reviewed the above documentation for accuracy and completeness, and I agree with the above.   *Total Encounter Time as defined by the  Centers for Medicare and Medicaid Services includes, in addition to the face-to-face time of a patient visit (documented in the note above) non-face-to-face time: obtaining and reviewing outside history, ordering and reviewing medications, tests or procedures, care coordination (communications with other health care professionals or caregivers) and documentation in the medical record.

## 2020-05-13 ENCOUNTER — Telehealth: Payer: Self-pay | Admitting: Oncology

## 2020-05-13 ENCOUNTER — Ambulatory Visit (INDEPENDENT_AMBULATORY_CARE_PROVIDER_SITE_OTHER): Payer: Medicare Other | Admitting: Primary Care

## 2020-05-13 ENCOUNTER — Encounter: Payer: Self-pay | Admitting: Primary Care

## 2020-05-13 DIAGNOSIS — R918 Other nonspecific abnormal finding of lung field: Secondary | ICD-10-CM

## 2020-05-13 DIAGNOSIS — R053 Chronic cough: Secondary | ICD-10-CM

## 2020-05-13 DIAGNOSIS — R05 Cough: Secondary | ICD-10-CM

## 2020-05-13 MED ORDER — BENZONATATE 100 MG PO CAPS: 100.0000 mg | ORAL_CAPSULE | Freq: Two times a day (BID) | ORAL | 1 refills | Status: DC | PRN

## 2020-05-13 MED ORDER — ALBUTEROL SULFATE HFA 108 (90 BASE) MCG/ACT IN AERS: 1.0000 | INHALATION_SPRAY | Freq: Four times a day (QID) | RESPIRATORY_TRACT | 2 refills | Status: DC | PRN

## 2020-05-13 NOTE — Patient Instructions (Addendum)
Orders: - Reschedule CT chest wo contrast for October 2021 (cancel February 2022) Re: multiple pulmonary nodules  Recommendations: - Try tessalon perles (benzonatate) - Take 1 tablet twice daily as needed for cough  - If you do not tolerate this medication try delsym cough syrup or robitussion over the counter   Rx: - Albuterol 2 puffs every 4-6 hours as needed for shortness of breath or cough  - Tessalon perles   Follow-up: 6 months with Dr. Valeta Harms    Benzonatate capsules What is this medicine? BENZONATATE (ben ZOE na tate) is used to treat cough. This medicine may be used for other purposes; ask your health care provider or pharmacist if you have questions. COMMON BRAND NAME(S): Tessalon Perles, Zonatuss What should I tell my health care provider before I take this medicine? They need to know if you have any of these conditions:  kidney or liver disease  an unusual or allergic reaction to benzonatate, anesthetics, other medicines, foods, dyes, or preservatives  pregnant or trying to get pregnant  breast-feeding How should I use this medicine? Take this medicine by mouth with a glass of water. Follow the directions on the prescription label. Avoid breaking, chewing, or sucking the capsule, as this can cause serious side effects. Take your medicine at regular intervals. Do not take your medicine more often than directed. Talk to your pediatrician regarding the use of this medicine in children. While this drug may be prescribed for children as young as 44 years old for selected conditions, precautions do apply. Overdosage: If you think you have taken too much of this medicine contact a poison control center or emergency room at once. NOTE: This medicine is only for you. Do not share this medicine with others. What if I miss a dose? If you miss a dose, take it as soon as you can. If it is almost time for your next dose, take only that dose. Do not take double or extra doses. What  may interact with this medicine? Do not take this medicine with any of the following medications:  MAOIs like Carbex, Eldepryl, Marplan, Nardil, and Parnate This list may not describe all possible interactions. Give your health care provider a list of all the medicines, herbs, non-prescription drugs, or dietary supplements you use. Also tell them if you smoke, drink alcohol, or use illegal drugs. Some items may interact with your medicine. What should I watch for while using this medicine? Tell your doctor if your symptoms do not improve or if they get worse. If you have a high fever, skin rash, or headache, see your health care professional. You may get drowsy or dizzy. Do not drive, use machinery, or do anything that needs mental alertness until you know how this medicine affects you. Do not sit or stand up quickly, especially if you are an older patient. This reduces the risk of dizzy or fainting spells. What side effects may I notice from receiving this medicine? Side effects that you should report to your doctor or health care professional as soon as possible:  allergic reactions like skin rash, itching or hives, swelling of the face, lips, or tongue  breathing problems  chest pain  confusion or hallucinations  irregular heartbeat  numbness of mouth or throat  seizures Side effects that usually do not require medical attention (report to your doctor or health care professional if they continue or are bothersome):  burning feeling in the eyes  constipation  headache  nasal congestion  stomach upset  This list may not describe all possible side effects. Call your doctor for medical advice about side effects. You may report side effects to FDA at 1-800-FDA-1088. Where should I keep my medicine? Keep out of the reach of children. Store at room temperature between 15 and 30 degrees C (59 and 86 degrees F). Keep tightly closed. Protect from light and moisture. Throw away any  unused medicine after the expiration date. NOTE: This sheet is a summary. It may not cover all possible information. If you have questions about this medicine, talk to your doctor, pharmacist, or health care provider.  2020 Elsevier/Gold Standard (2007-11-28 14:52:56)

## 2020-05-13 NOTE — Progress Notes (Signed)
@Patient  ID: Adrienne [name], female    DOB: Oct 02, 1927, 86 y.o.   MRN: [id number]  Chief Complaint  Patient presents with  . Follow-up    Referring provider: Kathyrn Lass, MD  HPI: 86 year old female, former smoker quit in 1950 (0.5 pack year hx). PMH significant for afib, HTN, GERD, breast cancer left, osteopenia. Patient of Dr. Valeta Harms, last seen on 01/20/20.   05/13/2020 Presents today for 4 month follow-up. She reports intermittent cough with increased frequency lately. Her cough is dry. Notices it more in the morning and at night. On rare occasional she will have some shortness of breath. She gets winded walking. She has some associated post nasal drip. CT chest in April showed waxing and waning pulmonary nodules suggestive of atypical infection such as MAI. Patient would not be a good candidate for bronchoscopy d/t age and would probably not tolerate MAI treatment course. She is due for repeat CT chest in February 2022. For piece of mind she has opted for repeat CT chest to be in 6 months instead of 9 months. Denies fever, chills, night sweats.   Imaging: 01/03/20 CT CHEST- Majority of bilateral pulmonary nodular opacities have decreased in size or resolved since previous study, although 2 sub-cm nodules in the right upper and middle lobes are new. These findings are most suggestive of a waxing and waning atypical infectious or inflammatory etiology, possibly MAI. Recommend continued follow-up by chest CT without contrast in 6 months  Allergies  Allergen Reactions  . Valium [Diazepam] Other (See Comments)    HYPERACTIVITY   . Amiodarone Hcl [Amiodarone] Other (See Comments)    AFIB  . Compazine [Prochlorperazine] Other (See Comments)    HYPERACTIVITY   . Other Other (See Comments)    "Kidney dye"--patient states this gave her severe shakes/chills  . Thorazine [Chlorpromazine] Other (See Comments)    HYPERACTIVITY   . Zometa [Zoledronic Acid] Other (See Comments)     PROBLEMS WITH EYES  TIA  . Ciprofloxacin Swelling    "swelling" in Rt.elbow    Immunization History  Administered Date(s) Administered  . Influenza,inj,Quad PF,6+ Mos 06/16/2019  . PFIZER SARS-COV-2 Vaccination 10/17/2019, 11/12/2019  . Zoster Recombinat (Shingrix) 09/18/2017, 02/19/2018    Past Medical History:  Diagnosis Date  . Afib ([place])   . Arthritis of ankle   . CAD (coronary artery disease)   . Cancer [facility]) 2012   breast cancer-right  . Compression fracture of T12 vertebra (HCC)   . Diverticulosis   . Edema   . Elevated uric acid in blood   . GERD (gastroesophageal reflux disease)   . GI bleed 12/2015  . Hyperlipidemia   . Hypertension   . Long-term (current) use of anticoagulants   . Moderate mitral regurgitation   . Pulmonary HTN ([place])    Echo 1/19: EF 60-65, no RWMA, Ao sclerosis, trivial AI, mild MR, mod BAE, mod TR, PASP 49  . Stroke ([place]) 01/25/2016  . TIA (transient ischemic attack) 09/2011  . Urinary incontinence   . Urinary retention     Tobacco History: Social History   Tobacco Use  Smoking Status Former Smoker  . Packs/day: 0.25  . Years: 2.00  . Pack years: 0.50  . Types: Cigarettes  . Quit date: 66  . Years since quitting: 71.7  Smokeless Tobacco Never Used   Counseling given: Not Answered   Outpatient Medications Prior to Visit  Medication Sig Dispense Refill  . acetaminophen (TYLENOL) 500 MG tablet Take 500-1,000 mg by  mouth every 12 (twelve) hours as needed (pain).     Marland Kitchen amoxicillin (AMOXIL) 500 MG capsule Take 2,000 mg by mouth See admin instructions. Take 4 capsules (2000 mg) by mouth 1 hour prior to dental appointments.    . Ascorbic Acid (VITAMIN C WITH ROSE HIPS) 500 MG tablet Take 500 mg by mouth in the morning and at bedtime.    Marland Kitchen atorvastatin (LIPITOR) 10 MG tablet TAKE 1 TABLET DAILY (Patient taking differently: Take 10 mg by mouth daily in the afternoon. (1600)) 90 tablet 3  . Calcium Citrate-Vitamin D (SM CALCIUM  CITRATE+D3 PETITE PO) Take 1 tablet by mouth in the morning, at noon, and at bedtime.    . cefaclor (CECLOR) 250 MG capsule Take 1 capsule (250 mg total) by mouth 3 (three) times daily. 15 capsule 0  . diltiazem (CARDIZEM) 60 MG tablet TAKE 1 TABLET THREE TIMES A DAY (Patient taking differently: Take 60 mg by mouth in the morning, at noon, and at bedtime. ) 270 tablet 3  . ELIQUIS 5 MG TABS tablet TAKE 1 TABLET TWICE A DAY (Patient taking differently: Take 5 mg by mouth 2 (two) times daily. ) 180 tablet 1  . furosemide (LASIX) 20 MG tablet Take 1 tablet (20 mg total) by mouth daily. 90 tablet 3  . metoprolol tartrate (LOPRESSOR) 100 MG tablet TAKE 1 TABLET TWICE A DAY (Patient taking differently: Take 100 mg by mouth in the morning and at bedtime. ) 180 tablet 3  . Multiple Vitamin (MULTIVITAMIN WITH MINERALS) TABS tablet Take 1 tablet by mouth daily. Centrum for Women 50+    . nitrofurantoin (MACRODANTIN) 100 MG capsule Take 100 mg by mouth at bedtime.     Marland Kitchen omeprazole (PRILOSEC) 10 MG capsule Take 10 mg by mouth daily before breakfast.     . saccharomyces boulardii (FLORASTOR) 250 MG capsule Take 250 mg by mouth 2 (two) times daily.     No facility-administered medications prior to visit.   Review of Systems  Review of Systems  Constitutional: Negative.   HENT: Negative.   Respiratory: Positive for cough. Negative for chest tightness, shortness of breath and wheezing.    Physical Exam  BP 140/68 (BP Location: Left Arm, Cuff Size: Normal)   Pulse 67   Temp 98 F (36.7 C) (Oral)   Ht 5\' 1"  (1.549 m)   Wt 147 lb (66.7 kg)   LMP 09/12/1973 (Approximate)   SpO2 96%   BMI 27.78 kg/m  Physical Exam Constitutional:      Appearance: Normal appearance.  HENT:     Mouth/Throat:     Mouth: Mucous membranes are moist.     Pharynx: Oropharynx is clear.  Cardiovascular:     Rate and Rhythm: Normal rate and regular rhythm.  Pulmonary:     Effort: Pulmonary effort is normal. No respiratory  distress.     Breath sounds: Normal breath sounds. No stridor. No wheezing.     Comments: Mostly clear, very fine rales Skin:    General: Skin is warm and dry.  Neurological:     General: No focal deficit present.     Mental Status: She is alert and oriented to person, place, and time. Mental status is at baseline.  Psychiatric:        Mood and Affect: Mood normal.        Behavior: Behavior normal.        Thought Content: Thought content normal.        Judgment: Judgment normal.  Lab Results:  CBC    Component Value Date/Time   WBC 9.8 05/12/2020 1240   RBC 4.10 05/12/2020 1240   HGB 12.3 05/12/2020 1240   HGB 12.4 05/06/2019 1608   HCT 37.8 05/12/2020 1240   HCT 36.6 05/06/2019 1608   PLT 287 05/12/2020 1240   PLT 237 05/06/2019 1608   MCV 92.2 05/12/2020 1240   MCV 92 05/06/2019 1608   MCH 30.0 05/12/2020 1240   MCHC 32.5 05/12/2020 1240   RDW 12.9 05/12/2020 1240   RDW 12.7 05/06/2019 1608   LYMPHSABS 1.3 05/12/2020 1240   MONOABS 0.9 05/12/2020 1240   EOSABS 0.1 05/12/2020 1240   BASOSABS 0.0 05/12/2020 1240    BMET    Component Value Date/Time   NA 136 05/12/2020 1240   NA 145 (H) 05/06/2019 1608   K 4.6 05/12/2020 1240   CL 102 05/12/2020 1240   CO2 26 05/12/2020 1240   GLUCOSE 139 (H) 05/12/2020 1240   BUN 19 05/12/2020 1240   BUN 19 05/06/2019 1608   CREATININE 1.11 (H) 05/12/2020 1240   CALCIUM 10.9 (H) 05/12/2020 1240   GFRNONAA 44 (L) 05/12/2020 1240   GFRAA 51 (L) 05/12/2020 1240    BNP    Component Value Date/Time   BNP 258.2 (H) 07/19/2016 1531    ProBNP    Component Value Date/Time   PROBNP 2,211 (H) 05/06/2019 1608    Imaging: No results found.   Assessment & Plan:   Multiple pulmonary nodules - Patient has intermittent cough, recently increased in frequency.  - CT imaging showed waxing and waning pulmonary nodules suggestive of atypical infection such as MAI. Per Dr. Valeta Harms she would not be a good candidate for  bronchoscopy d/t age and would probably not tolerate MAI treatment course. She is due for repeat CT chest in February 2022.  - D/t worsening cough we have elected to repeat CT chest sooner at recommended 6 months rather than originally decided 9 months  Chronic cough - Try tessalon perles (benzonatate) - Take 1 tablet twice daily as needed for cough  - If you do not tolerate this medication try delsym cough syrup or robitussion over the counter     Martyn Ehrich, NP 05/24/2020

## 2020-05-13 NOTE — Telephone Encounter (Signed)
Scheduled appts per 8/31 los. Left voicemail with appt date and time.  °

## 2020-05-24 ENCOUNTER — Encounter: Payer: Self-pay | Admitting: Primary Care

## 2020-05-24 DIAGNOSIS — R053 Chronic cough: Secondary | ICD-10-CM | POA: Insufficient documentation

## 2020-05-24 DIAGNOSIS — R918 Other nonspecific abnormal finding of lung field: Secondary | ICD-10-CM | POA: Insufficient documentation

## 2020-05-24 DIAGNOSIS — R059 Cough, unspecified: Secondary | ICD-10-CM | POA: Insufficient documentation

## 2020-05-24 NOTE — Assessment & Plan Note (Signed)
-   Try tessalon perles (benzonatate) - Take 1 tablet twice daily as needed for cough  - If you do not tolerate this medication try delsym cough syrup or robitussion over the counter

## 2020-05-24 NOTE — Assessment & Plan Note (Signed)
-   Patient has intermittent cough, recently increased in frequency.  - CT imaging showed waxing and waning pulmonary nodules suggestive of atypical infection such as MAI. Per Dr. Valeta Harms she would not be a good candidate for bronchoscopy d/t age and would probably not tolerate MAI treatment course. She is due for repeat CT chest in February 2022.  - D/t worsening cough we have elected to repeat CT chest sooner at recommended 6 months rather than originally decided 9 months

## 2020-05-26 DIAGNOSIS — N3020 Other chronic cystitis without hematuria: Secondary | ICD-10-CM | POA: Diagnosis not present

## 2020-05-26 DIAGNOSIS — N8111 Cystocele, midline: Secondary | ICD-10-CM | POA: Diagnosis not present

## 2020-05-26 DIAGNOSIS — R300 Dysuria: Secondary | ICD-10-CM | POA: Diagnosis not present

## 2020-06-01 DIAGNOSIS — Z853 Personal history of malignant neoplasm of breast: Secondary | ICD-10-CM | POA: Diagnosis not present

## 2020-06-01 DIAGNOSIS — M810 Age-related osteoporosis without current pathological fracture: Secondary | ICD-10-CM | POA: Diagnosis not present

## 2020-06-01 DIAGNOSIS — I2510 Atherosclerotic heart disease of native coronary artery without angina pectoris: Secondary | ICD-10-CM | POA: Diagnosis not present

## 2020-06-01 DIAGNOSIS — I4891 Unspecified atrial fibrillation: Secondary | ICD-10-CM | POA: Diagnosis not present

## 2020-06-01 DIAGNOSIS — I272 Pulmonary hypertension, unspecified: Secondary | ICD-10-CM | POA: Diagnosis not present

## 2020-06-01 DIAGNOSIS — D509 Iron deficiency anemia, unspecified: Secondary | ICD-10-CM | POA: Diagnosis not present

## 2020-06-01 DIAGNOSIS — N183 Chronic kidney disease, stage 3 unspecified: Secondary | ICD-10-CM | POA: Diagnosis not present

## 2020-06-01 DIAGNOSIS — E7800 Pure hypercholesterolemia, unspecified: Secondary | ICD-10-CM | POA: Diagnosis not present

## 2020-06-03 ENCOUNTER — Ambulatory Visit: Payer: Medicare Other | Admitting: Obstetrics and Gynecology

## 2020-06-09 ENCOUNTER — Other Ambulatory Visit: Payer: Self-pay | Admitting: Cardiology

## 2020-06-09 NOTE — Telephone Encounter (Signed)
Pt last saw Dr Marlou Porch 08/21/19, last labs 01/14/20 Creat 1.15, age 86, weight 66.7kg, based on specified criteria pt is on appropriate dosage of Eliquis 5mg  BID.  Will refill rx.

## 2020-06-10 DIAGNOSIS — R110 Nausea: Secondary | ICD-10-CM | POA: Diagnosis not present

## 2020-06-11 DIAGNOSIS — R110 Nausea: Secondary | ICD-10-CM | POA: Diagnosis not present

## 2020-06-18 NOTE — Progress Notes (Signed)
Thanks for seeing her.  Garner Nash, DO [facility] 06/18/2020 12:20 AM

## 2020-06-23 NOTE — Progress Notes (Signed)
GYNECOLOGY  VISIT   HPI: 86 y.o.   Married  Caucasian  female   [phone] with Patient's last menstrual period was 09/12/1973 (approximate).   here for follow up.  Wearing a #7 ring with support pessary.  She has urinary retention when the pessary is smaller.  She has chronic granulation tissue with use of the current pessary.   No spotting for a couple of weeks.  She took her pessary out this am.  She is not able to place it back in the vagina.   Voiding well.   She had a UTI and she was treated with Doxycycline, which causes stomach upset. She was seen at [facility] and has been dx with colonization.  She has been instructed not to be seen unless she is symptomatic. Will start an over the counter probiotic, D-mannose, and cranberry in it.   She did a Covid test, and it was negative.   She received her Pfizer, Covid vaccine.   GYNECOLOGIC HISTORY: Patient's last menstrual period was 09/12/1973 (approximate). Contraception:  PMP Menopausal hormone therapy:  none Last mammogram: 11-20-19 Unilateral Lt.Br./masses and asymmetry/density C--Lt.Br.US and Bx revealsGRADE III INVASIVE DUCTAL CARCINOMA. DUCTAL CARCINOMA IN SITU of the LEFT breast, 11 o'clock, upper inner,ribbon clip. Last pap smear: 03-11-19 Neg,11-25-15 Neg        OB History    Gravida  3   Para  3   Term  3   Preterm      AB      Living  3     SAB      TAB      Ectopic      Multiple      Live Births                 Patient Active Problem List   Diagnosis Date Noted  . Multiple pulmonary nodules 05/24/2020  . Chronic cough 05/24/2020  . Breast cancer, left breast ([place]) 01/22/2020  . Malignant neoplasm of upper-inner quadrant of left breast in female, estrogen receptor negative ([place]) 12/20/2019  . Malignant neoplasm of overlapping sites of right breast in female, estrogen receptor positive ([place]) 08/14/2019  . Osteopenia 08/14/2019  . Diplopia 01/29/2016  . GERD (gastroesophageal  reflux disease) 01/29/2016  . Cerebrovascular accident (CVA) due to embolism of left middle cerebral artery ([place]) 01/29/2016  . Persistent atrial fibrillation ([place])   . Bleeding gastrointestinal   . Gait disturbance, post-stroke 01/27/2016  . Fall   . Secondary hypertension, unspecified   . Cerebral infarction due to thrombosis of left middle cerebral artery (HCC) s/p mechanical thrombectomy   . Malnutrition of moderate degree 01/09/2016  . GI bleed 01/06/2016  . Chronic atrial fibrillation ([place]) 06/10/2015  . Chronic anticoagulation 06/10/2015  . Hyperlipidemia 06/10/2015  . Essential hypertension 06/10/2015  . History of stroke 06/10/2015  . Coronary artery disease due to lipid rich plaque 06/10/2015  . Afib ([place])   . Elevated uric acid in blood   . Arthritis of ankle   . Pulmonary HTN ([place])     Past Medical History:  Diagnosis Date  . Afib ([place])   . Arthritis of ankle   . CAD (coronary artery disease)   . Cancer [facility]) 2012   breast cancer-right  . Compression fracture of T12 vertebra (HCC)   . Diverticulosis   . Edema   . Elevated uric acid in blood   . GERD (gastroesophageal reflux disease)   . GI bleed 12/2015  . Hyperlipidemia   .  Hypertension   . Long-term (current) use of anticoagulants   . Moderate mitral regurgitation   . Pulmonary HTN ([place])    Echo 1/19: EF 60-65, no RWMA, Ao sclerosis, trivial AI, mild MR, mod BAE, mod TR, PASP 49  . Stroke ([place]) 01/25/2016  . TIA (transient ischemic attack) 09/2011  . Urinary incontinence   . Urinary retention     Past Surgical History:  Procedure Laterality Date  . APPENDECTOMY    . AXILLARY SENTINEL NODE BIOPSY Left 01/22/2020   Procedure: Left Axillary Sentinel Node Biopsy;  Surgeon: Rolm Bookbinder, MD;  Location: [place];  Service: General;  Laterality: Left;  . BREAST LUMPECTOMY WITH RADIOACTIVE SEED AND SENTINEL LYMPH NODE BIOPSY Left 01/22/2020   Procedure: LEFT BREAST LUMPECTOMY WITH RADIOACTIVE SEED;  Surgeon:  Rolm Bookbinder, MD;  Location: [place];  Service: General;  Laterality: Left;  . BREAST SURGERY  2012   Rt.mastectomy--[place], [place]  2013  . CATARACT EXTRACTION, BILATERAL    . DILATION AND CURETTAGE OF UTERUS     in her early 54's for DUB  . ESOPHAGOGASTRODUODENOSCOPY Left 01/08/2016   Procedure: ESOPHAGOGASTRODUODENOSCOPY (EGD);  Surgeon: Arta Silence, MD;  Location: [facility];  Service: Endoscopy;  Laterality: Left;  . GALLBLADDER SURGERY    . MASTECTOMY Right   . RADIOLOGY WITH ANESTHESIA N/A 01/25/2016   Procedure: RADIOLOGY WITH ANESTHESIA;  Surgeon: Luanne Bras, MD;  Location: [place];  Service: Radiology;  Laterality: N/A;  . REPLACEMENT TOTAL KNEE BILATERAL    . TONSILLECTOMY    . TONSILLECTOMY AND ADENOIDECTOMY     as a child    Current Outpatient Medications  Medication Sig Dispense Refill  . acetaminophen (TYLENOL) 500 MG tablet Take 500-1,000 mg by mouth every 12 (twelve) hours as needed (pain).     Marland Kitchen albuterol (VENTOLIN HFA) 108 (90 Base) MCG/ACT inhaler Inhale 1 puff into the lungs every 6 (six) hours as needed for wheezing or shortness of breath (Cough). 18 g 2  . amoxicillin (AMOXIL) 500 MG capsule Take 2,000 mg by mouth See admin instructions. Take 4 capsules (2000 mg) by mouth 1 hour prior to dental appointments.    . Ascorbic Acid (VITAMIN C WITH ROSE HIPS) 500 MG tablet Take 500 mg by mouth in the morning and at bedtime.    Marland Kitchen atorvastatin (LIPITOR) 10 MG tablet TAKE 1 TABLET DAILY 90 tablet 0  . benzonatate (TESSALON) 100 MG capsule Take 1 capsule (100 mg total) by mouth 2 (two) times daily as needed for cough. 30 capsule 1  . Calcium Citrate-Vitamin D (SM CALCIUM CITRATE+D3 PETITE PO) Take 1 tablet by mouth in the morning, at noon, and at bedtime.    . cefaclor (CECLOR) 250 MG capsule Take 1 capsule (250 mg total) by mouth 3 (three) times daily. 15 capsule 0  . diltiazem (CARDIZEM) 60 MG tablet TAKE 1 TABLET THREE TIMES A DAY 270  tablet 0  . ELIQUIS 5 MG TABS tablet TAKE 1 TABLET TWICE A DAY 180 tablet 1  . furosemide (LASIX) 20 MG tablet Take 1 tablet (20 mg total) by mouth daily. 90 tablet 3  . metoprolol tartrate (LOPRESSOR) 100 MG tablet TAKE 1 TABLET TWICE A DAY 180 tablet 0  . Multiple Vitamin (MULTIVITAMIN WITH MINERALS) TABS tablet Take 1 tablet by mouth daily. Centrum for Women 50+    . nitrofurantoin (MACRODANTIN) 100 MG capsule Take 100 mg by mouth at bedtime.     Marland Kitchen omeprazole (PRILOSEC) 10 MG capsule  Take 10 mg by mouth daily before breakfast.     . saccharomyces boulardii (FLORASTOR) 250 MG capsule Take 250 mg by mouth 2 (two) times daily.     No current facility-administered medications for this visit.     ALLERGIES: Valium [diazepam], Amiodarone hcl [amiodarone], Compazine [prochlorperazine], Other, Thorazine [chlorpromazine], Zometa [zoledronic acid], and Ciprofloxacin  Family History  Problem Relation Age of Onset  . Asthma Mother   . CVA Father   . Atrial fibrillation Sister        HAS PACER  . Pneumonia Brother   . Cancer Daughter        COLON CANCER  . Cancer Maternal Grandmother        breast cancer    Social History   Socioeconomic History  . Marital status: Married    Spouse name: Not on file  . Number of children: Not on file  . Years of education: Not on file  . Highest education level: Not on file  Occupational History  . Not on file  Tobacco Use  . Smoking status: Former Smoker    Packs/day: 0.25    Years: 2.00    Pack years: 0.50    Types: Cigarettes    Quit date: 1950    Years since quitting: 71.8  . Smokeless tobacco: Never Used  Vaping Use  . Vaping Use: Never used  Substance and Sexual Activity  . Alcohol use: No    Alcohol/week: 0.0 standard drinks  . Drug use: No  . Sexual activity: Not Currently    Partners: Male    Birth control/protection: Post-menopausal  Other Topics Concern  . Not on file  Social History Narrative  . Not on file   Social  Determinants of Health   Financial Resource Strain:   . Difficulty of Paying Living Expenses: Not on file  Food Insecurity:   . Worried About Charity fundraiser in the Last Year: Not on file  . Ran Out of Food in the Last Year: Not on file  Transportation Needs:   . Lack of Transportation (Medical): Not on file  . Lack of Transportation (Non-Medical): Not on file  Physical Activity:   . Days of Exercise per Week: Not on file  . Minutes of Exercise per Session: Not on file  Stress:   . Feeling of Stress : Not on file  Social Connections:   . Frequency of Communication with Friends and Family: Not on file  . Frequency of Social Gatherings with Friends and Family: Not on file  . Attends Religious Services: Not on file  . Active Member of Clubs or Organizations: Not on file  . Attends Archivist Meetings: Not on file  . Marital Status: Not on file  Intimate Partner Violence:   . Fear of Current or Ex-Partner: Not on file  . Emotionally Abused: Not on file  . Physically Abused: Not on file  . Sexually Abused: Not on file    Review of Systems  All other systems reviewed and are negative.   PHYSICAL EXAMINATION:    BP 132/60   Pulse 68   Ht 5' 0.5" (1.537 m)   Wt 146 lb (66.2 kg)   LMP 09/12/1973 (Approximate)   SpO2 96%   BMI 28.04 kg/m     General appearance: alert, cooperative and appears stated age   Pelvic: External genitalia:  no lesions              Urethra:  normal appearing urethra  with no masses, tenderness or lesions              Bartholins and Skenes: normal                 Vagina: granulation tissue of the left lateral apex.                Cervix: no lesions                Bimanual Exam:  Uterus:  normal size, contour, position, consistency, mobility, non-tender              Adnexa: no mass, fullness, tenderness           Pessary placed without difficulty.   Chaperone was present for exam.  ASSESSMENT  Complete uterovaginal prolapse.   Vaginal granulation tissue. Previously biopsied and benign.  Pessary maintenance.  Recurrent UTIs, resistant to Nitrofurantoin.  On Eliquis.  Hx breast cancer.  PLAN  Continue pessary care.  Use KY Jelly for moisturizing the vagina.  FU in 3 months for annual exam and pessary check. She will see urology for urinary tract infections as needed.

## 2020-06-24 ENCOUNTER — Other Ambulatory Visit: Payer: Self-pay

## 2020-06-24 ENCOUNTER — Ambulatory Visit (INDEPENDENT_AMBULATORY_CARE_PROVIDER_SITE_OTHER): Payer: Medicare Other | Admitting: Obstetrics and Gynecology

## 2020-06-24 ENCOUNTER — Encounter: Payer: Self-pay | Admitting: Obstetrics and Gynecology

## 2020-06-24 VITALS — BP 132/60 | HR 68 | Ht 60.5 in | Wt 146.0 lb

## 2020-06-24 DIAGNOSIS — Z4689 Encounter for fitting and adjustment of other specified devices: Secondary | ICD-10-CM

## 2020-06-24 DIAGNOSIS — N813 Complete uterovaginal prolapse: Secondary | ICD-10-CM | POA: Diagnosis not present

## 2020-06-24 DIAGNOSIS — I63412 Cerebral infarction due to embolism of left middle cerebral artery: Secondary | ICD-10-CM | POA: Diagnosis not present

## 2020-06-29 ENCOUNTER — Ambulatory Visit
Admission: RE | Admit: 2020-06-29 | Discharge: 2020-06-29 | Disposition: A | Discharge status: Home / Self Care | Payer: Medicare Other | Source: Ambulatory Visit | Attending: Pulmonary Disease | Admitting: Pulmonary Disease

## 2020-06-29 DIAGNOSIS — R911 Solitary pulmonary nodule: Secondary | ICD-10-CM | POA: Diagnosis not present

## 2020-06-29 DIAGNOSIS — R918 Other nonspecific abnormal finding of lung field: Secondary | ICD-10-CM

## 2020-06-29 DIAGNOSIS — R059 Cough, unspecified: Secondary | ICD-10-CM | POA: Diagnosis not present

## 2020-07-01 DIAGNOSIS — Z6828 Body mass index (BMI) 28.0-28.9, adult: Secondary | ICD-10-CM | POA: Diagnosis not present

## 2020-07-01 DIAGNOSIS — K219 Gastro-esophageal reflux disease without esophagitis: Secondary | ICD-10-CM | POA: Diagnosis not present

## 2020-07-01 DIAGNOSIS — E7800 Pure hypercholesterolemia, unspecified: Secondary | ICD-10-CM | POA: Diagnosis not present

## 2020-07-01 DIAGNOSIS — Z23 Encounter for immunization: Secondary | ICD-10-CM | POA: Diagnosis not present

## 2020-07-01 NOTE — Progress Notes (Signed)
CARDIOLOGY OFFICE NOTE  Date:  07/14/2020    [place] Date of Birth: May 17, 1928 Medical Record #[id number]  PCP:  Kathyrn Lass, MD  Cardiologist:  Marisa Cyphers    Chief Complaint  Patient presents with  . Follow-up    Seen for Dr. Marlou Porch    History of Present Illness: Adrienne [name] is a 86 y.o. female who presents today for a follow up visit. Seen for Dr. Marlou Porch.   She has a history of AF, pulmonary HTN, HLD with statin intolerance, prior stroke, non obstructive CAD per cath in 2013, moderate MR, & chronic anticoagulation. She has had intolerance to amiodarone - she is managed with rate control and anticoagulation - was coumadin that was transitioned over to Eliquis following her stroke in 2017. She has had chronic dyspnea - has had PTH in the past.Other issues include breast cancer, nonobstructive CAD per cath in 2013, moderate MR, HLD with intolerance to statins and vertigo. She has had chronic dyspnea - has had PTH in the past as well as pulmonary nodules.    Last seen in December by Dr. Marlou Porch. Felt to be doing ok. Noted chronic cough.   Comes in today. Here alone. She notes she has seen her diastolic readings are lower. Her systolic is ranging 496 to 140. Still with shortness of breath with walking - no worse  But no better. Masking makes this worse. Not light headed or dizzy. No syncope. No falls. No bleeding. They have both been vaccinated for COVID 19 and had flu shots.   Past Medical History:  Diagnosis Date  . Afib ([place])   . Arthritis of ankle   . CAD (coronary artery disease)   . Cancer [facility]) 2012   breast cancer-right  . Compression fracture of T12 vertebra (HCC)   . Diverticulosis   . Edema   . Elevated uric acid in blood   . GERD (gastroesophageal reflux disease)   . GI bleed 12/2015  . Hyperlipidemia   . Hypertension   . Long-term (current) use of anticoagulants   . Moderate mitral regurgitation   . Pulmonary HTN ([place])     Echo 1/19: EF 60-65, no RWMA, Ao sclerosis, trivial AI, mild MR, mod BAE, mod TR, PASP 49  . Stroke ([place]) 01/25/2016  . TIA (transient ischemic attack) 09/2011  . Urinary incontinence   . Urinary retention     Past Surgical History:  Procedure Laterality Date  . APPENDECTOMY    . AXILLARY SENTINEL NODE BIOPSY Left 01/22/2020   Procedure: Left Axillary Sentinel Node Biopsy;  Surgeon: Rolm Bookbinder, MD;  Location: [place];  Service: General;  Laterality: Left;  . BREAST LUMPECTOMY WITH RADIOACTIVE SEED AND SENTINEL LYMPH NODE BIOPSY Left 01/22/2020   Procedure: LEFT BREAST LUMPECTOMY WITH RADIOACTIVE SEED;  Surgeon: Rolm Bookbinder, MD;  Location: [place];  Service: General;  Laterality: Left;  . BREAST SURGERY  2012   Rt.mastectomy--[place], [place]  2013  . CATARACT EXTRACTION, BILATERAL    . DILATION AND CURETTAGE OF UTERUS     in her early 20's for DUB  . ESOPHAGOGASTRODUODENOSCOPY Left 01/08/2016   Procedure: ESOPHAGOGASTRODUODENOSCOPY (EGD);  Surgeon: Arta Silence, MD;  Location: [facility];  Service: Endoscopy;  Laterality: Left;  . GALLBLADDER SURGERY    . MASTECTOMY Right   . RADIOLOGY WITH ANESTHESIA N/A 01/25/2016   Procedure: RADIOLOGY WITH ANESTHESIA;  Surgeon: Luanne Bras, MD;  Location: [place];  Service: Radiology;  Laterality: N/A;  .  REPLACEMENT TOTAL KNEE BILATERAL    . TONSILLECTOMY    . TONSILLECTOMY AND ADENOIDECTOMY     as a child     Medications: Current Meds  Medication Sig  . acetaminophen (TYLENOL) 500 MG tablet Take 500-1,000 mg by mouth every 12 (twelve) hours as needed (pain).   Marland Kitchen albuterol (VENTOLIN HFA) 108 (90 Base) MCG/ACT inhaler Inhale 1 puff into the lungs every 6 (six) hours as needed for wheezing or shortness of breath (Cough).  Marland Kitchen amoxicillin (AMOXIL) 500 MG capsule Take 2,000 mg by mouth See admin instructions. Take 4 capsules (2000 mg) by mouth 1 hour prior to dental appointments.  . Ascorbic Acid (VITAMIN  C WITH ROSE HIPS) 500 MG tablet Take 500 mg by mouth in the morning and at bedtime.  Marland Kitchen atorvastatin (LIPITOR) 10 MG tablet TAKE 1 TABLET DAILY  . benzonatate (TESSALON) 100 MG capsule Take 1 capsule (100 mg total) by mouth 2 (two) times daily as needed for cough.  . Calcium Citrate-Vitamin D (SM CALCIUM CITRATE+D3 PETITE PO) Take 1 tablet by mouth in the morning, at noon, and at bedtime.  . cefaclor (CECLOR) 250 MG capsule Take 1 capsule (250 mg total) by mouth 3 (three) times daily.  Marland Kitchen diltiazem (CARDIZEM) 60 MG tablet TAKE 1 TABLET THREE TIMES A DAY  . ELIQUIS 5 MG TABS tablet TAKE 1 TABLET TWICE A DAY  . furosemide (LASIX) 20 MG tablet Take 1 tablet (20 mg total) by mouth daily.  . metoprolol tartrate (LOPRESSOR) 100 MG tablet TAKE 1 TABLET TWICE A DAY  . Multiple Vitamin (MULTIVITAMIN WITH MINERALS) TABS tablet Take 1 tablet by mouth daily. Centrum for Women 50+  . nitrofurantoin (MACRODANTIN) 100 MG capsule Take 100 mg by mouth at bedtime.   Marland Kitchen omeprazole (PRILOSEC) 10 MG capsule Take 10 mg by mouth daily before breakfast.   . Probiotic Product (UP4 PROBIOTICS WOMENS PO) Take 1 capsule by mouth daily.     Allergies: Allergies  Allergen Reactions  . Valium [Diazepam] Other (See Comments)    HYPERACTIVITY   . Amiodarone Hcl [Amiodarone] Other (See Comments)    AFIB  . Compazine [Prochlorperazine] Other (See Comments)    HYPERACTIVITY   . Other Other (See Comments)    "Kidney dye"--patient states this gave her severe shakes/chills  . Thorazine [Chlorpromazine] Other (See Comments)    HYPERACTIVITY   . Zometa [Zoledronic Acid] Other (See Comments)    PROBLEMS WITH EYES  TIA  . Ciprofloxacin Swelling    "swelling" in Rt.elbow    Social History: The patient  reports that she quit smoking about 71 years ago. Her smoking use included cigarettes. She has a 0.50 pack-year smoking history. She has never used smokeless tobacco. She reports that she does not drink alcohol and does not  use drugs.   Family History: The patient's family history includes Asthma in her mother; Atrial fibrillation in her sister; CVA in her father; Cancer in her daughter and maternal grandmother; Pneumonia in her brother.   Review of Systems: Please see the history of present illness.   All other systems are reviewed and negative.   Physical Exam: VS:  BP 118/68   Pulse 68   Ht 5\' 2"  (1.575 m)   Wt 144 lb (65.3 kg)   LMP 09/12/1973 (Approximate)   SpO2 95%   BMI 26.34 kg/m  .  BMI Body mass index is 26.34 kg/m.  Wt Readings from Last 3 Encounters:  07/14/20 144 lb (65.3 kg)  06/24/20 146  lb (66.2 kg)  05/13/20 147 lb (66.7 kg)    General: Pleasant. Elderly but looks younger than her stated age. She is alert and in no acute distress.   Cardiac: Irregular irregular rhythm. Rate is fine. No significant edema. Lots of varicosities.  Respiratory:  Lungs are clear to auscultation bilaterally with normal work of breathing.  GI: Soft and nontender.  MS: No deformity or atrophy. Gait and ROM intact.  Skin: Warm and dry. Color is normal.  Neuro:  Strength and sensation are intact and no gross focal deficits noted.  Psych: Alert, appropriate and with normal affect.   LABORATORY DATA:  EKG:  EKG is ordered today.  Personally reviewed by me. This demonstrates AF with controlled VR of 68.  Lab Results  Component Value Date   WBC 9.8 05/12/2020   HGB 12.3 05/12/2020   HCT 37.8 05/12/2020   PLT 287 05/12/2020   GLUCOSE 139 (H) 05/12/2020   CHOL 128 05/06/2019   TRIG 101 05/06/2019   HDL 50 05/06/2019   LDLCALC 58 05/06/2019   ALT 8 05/12/2020   AST 12 (L) 05/12/2020   NA 136 05/12/2020   K 4.6 05/12/2020   CL 102 05/12/2020   CREATININE 1.11 (H) 05/12/2020   BUN 19 05/12/2020   CO2 26 05/12/2020   TSH 3.280 05/06/2019   INR 1.1 01/22/2020   HGBA1C 5.6 01/26/2016       BNP (last 3 results) No results for input(s): BNP in the last 8760 hours.  ProBNP (last 3  results) No results for input(s): PROBNP in the last 8760 hours.   Other Studies Reviewed Today:  ECHO IMPRESSIONS 04/2019  1. The left ventricle has normal systolic function with an ejection  fraction of 60-65%. The cavity size was normal. Left ventricular diastolic  function could not be evaluated secondary to atrial fibrillation.  2. The right ventricle has normal systolic function. The cavity was  normal. There is no increase in right ventricular wall thickness. Right  ventricular systolic pressure is normal.  3. Left atrial size was severely dilated.  4. There is mild mitral annular calcification present.  5. The aortic valve is tricuspid. Mild sclerosis of the aortic valve.  Aortic valve regurgitation is trivial by color flow Doppler.  6. The aorta is normal unless otherwise noted.  7. The inferior vena cava was dilated in size with >50% respiratory  variability.    Assessment/Plan:  1. Persistent AF - managed with rate control and anticoagulation with Eliquis. Failed amiodarone in the past. Her rate is fine today. No changes made today.   2. Prior stroke with subtherapeutic INR - now on Eliquis - no problems noted. No falls.   3. Non obstructive CAD - would favor conservative therapy.   4. HLD - labs from last month noted - LDL below 70.   5. ?of pulmonary HTN - not consistently demonstrated. She has chronic shortness of breath - seems to be at her baseline.   6. Chronic lower extremity edema - not really significant today. She is using her Lasix about every other day.   7. Advanced age.   Current medicines are reviewed with the patient today.  The patient does not have concerns regarding medicines other than what has been noted above.  The following changes have been made:  See above.  Labs/ tests ordered today include:    Orders Placed This Encounter  Procedures  . EKG 12-Lead     Disposition:   FU with Dr.  Skains in February - she had a visit with  him in December - we will push that out to February.    Patient is agreeable to this plan and will call if any problems develop in the interim.   SignedTruitt Merle, NP  07/14/2020 2:16 PM  [facility] [address] [place], [place]  [address] Phone: [phone] Fax: [phone]

## 2020-07-07 DIAGNOSIS — Z23 Encounter for immunization: Secondary | ICD-10-CM | POA: Diagnosis not present

## 2020-07-10 ENCOUNTER — Other Ambulatory Visit: Payer: Self-pay | Admitting: Oncology

## 2020-07-10 DIAGNOSIS — C50811 Malignant neoplasm of overlapping sites of right female breast: Secondary | ICD-10-CM

## 2020-07-10 DIAGNOSIS — Z170 Estrogen receptor positive status [ER+]: Secondary | ICD-10-CM

## 2020-07-10 NOTE — Progress Notes (Unsigned)
Adrienne [name] called today concerned about a finding in her recent CT scan of the chest.  I have set her up for a ultrasound of the left breast and then to see me a couple of weeks later.

## 2020-07-13 ENCOUNTER — Telehealth: Payer: Self-pay | Admitting: Oncology

## 2020-07-13 NOTE — Telephone Encounter (Signed)
Scheduled apt per 10/29 sch msg - pt is aware of appt date and time

## 2020-07-14 ENCOUNTER — Other Ambulatory Visit: Payer: Self-pay

## 2020-07-14 ENCOUNTER — Encounter: Payer: Self-pay | Admitting: Nurse Practitioner

## 2020-07-14 ENCOUNTER — Ambulatory Visit (INDEPENDENT_AMBULATORY_CARE_PROVIDER_SITE_OTHER): Payer: Medicare Other | Admitting: Nurse Practitioner

## 2020-07-14 VITALS — BP 118/68 | HR 68 | Ht 62.0 in | Wt 144.0 lb

## 2020-07-14 DIAGNOSIS — R059 Cough, unspecified: Secondary | ICD-10-CM | POA: Diagnosis not present

## 2020-07-14 DIAGNOSIS — I2583 Coronary atherosclerosis due to lipid rich plaque: Secondary | ICD-10-CM | POA: Diagnosis not present

## 2020-07-14 DIAGNOSIS — Z7901 Long term (current) use of anticoagulants: Secondary | ICD-10-CM

## 2020-07-14 DIAGNOSIS — I2510 Atherosclerotic heart disease of native coronary artery without angina pectoris: Secondary | ICD-10-CM

## 2020-07-14 DIAGNOSIS — I272 Pulmonary hypertension, unspecified: Secondary | ICD-10-CM

## 2020-07-14 DIAGNOSIS — I482 Chronic atrial fibrillation, unspecified: Secondary | ICD-10-CM

## 2020-07-14 DIAGNOSIS — R0602 Shortness of breath: Secondary | ICD-10-CM | POA: Diagnosis not present

## 2020-07-14 NOTE — Patient Instructions (Addendum)
After Visit Summary:  We will be checking the following labs today - NONE   Medication Instructions:    Continue with your current medicines.    If you need a refill on your cardiac medications before your next appointment, please call your pharmacy.     Testing/Procedures To Be Arranged:  N/A  Follow-Up:   See Dr. Marlou Porch in February     At [facility], you and your health needs are our priority.  As part of our continuing mission to provide you with exceptional heart care, we have created designated Provider Care Teams.  These Care Teams include your primary Cardiologist (physician) and Advanced Practice Providers (APPs -  Physician Assistants and Nurse Practitioners) who all work together to provide you with the care you need, when you need it.  Special Instructions:  . Stay safe, wash your hands for at least 20 seconds and wear a mask when needed.  . It was good to talk with you today.    Call the [place] office at [phone] if you have any questions, problems or concerns.

## 2020-07-15 ENCOUNTER — Ambulatory Visit (INDEPENDENT_AMBULATORY_CARE_PROVIDER_SITE_OTHER): Payer: Medicare Other | Admitting: Primary Care

## 2020-07-15 ENCOUNTER — Encounter: Payer: Self-pay | Admitting: Primary Care

## 2020-07-15 DIAGNOSIS — Z170 Estrogen receptor positive status [ER+]: Secondary | ICD-10-CM | POA: Diagnosis not present

## 2020-07-15 DIAGNOSIS — R918 Other nonspecific abnormal finding of lung field: Secondary | ICD-10-CM

## 2020-07-15 DIAGNOSIS — C50811 Malignant neoplasm of overlapping sites of right female breast: Secondary | ICD-10-CM

## 2020-07-15 NOTE — Progress Notes (Signed)
@Patient  ID: Adrienne [name], female    DOB: 08-Sep-1928, 86 y.o.   MRN: [id number]  Chief Complaint  Patient presents with  . Follow-up    discuss CT results    Referring provider: Kathyrn Lass, MD  HPI: 86 year old female, former smoker quit in 1950 (0.5 pack year hx). PMH significant for afib, HTN, GERD, breast cancer left, osteopenia. Patient of Dr. Valeta Harms.   Previous [facility] pulmonary encounter: 05/13/2020 Presents today for 4 month follow-up. She reports intermittent cough with increased frequency lately. Her cough is dry. Notices it more in the morning and at night. On rare occasional she will have some shortness of breath. She gets winded walking. She has some associated post nasal drip. CT chest in April showed waxing and waning pulmonary nodules suggestive of atypical infection such as MAI. Patient would not be a good candidate for bronchoscopy d/t age and would probably not tolerate MAI treatment course. She is due for repeat CT chest in February 2022. For piece of mind she has opted for repeat CT chest to be in 6 months instead of 9 months. Denies fever, chills, night sweats.   07/15/2020- Interim hx Patient presents today for 2 month follow-up. She is doing well. She has a dry cough with no production. She experiences mild dyspnea on exertion. She was a very light former smoker. Repeat CT chest to monitor waxing/waning pulmonary nodules appears stable, previously seen nodules RUL are no longer identified. Pulmonary nodules in RLL/RML are unchanged in size and there are no new nodules. Postoperative right mastectomy with new collection in the left breast measuring 4.3 x 7.1 cm. She called her oncologist, she has been ordered for breast imaging with their office and follow-up.   Imaging: 01/03/20 CT CHEST- Majority of bilateral pulmonary nodular opacities have decreased in size or resolved since previous study, although 2 sub-cm nodules in the right upper and middle lobes are new.  These findings are most suggestive of a waxing and waning atypical infectious or inflammatory etiology, possibly MAI. Recommend continued follow-up by chest CT without contrast in 6 months  06/29/20 CT chest wo contrast- 1. Previous nodules in the posterior right upper lobe and in the lateral right middle lobe are no longer identified, likely indicating inflammatory etiology. 2. Follow-up of pulmonary nodules in the right lung base and right middle lobe are unchanged. No new nodules are seen. 3. Postoperative right mastectomy with new collection in the left breast measuring 4.3 x 7.1 cm. It is likely that this represents a postoperative defect. Alternatively a developing cyst or abscess could be present. 4. Skin thickening in the left breast is likely post radiation change. Radiation changes demonstrated in the anterior left lung. 5. Aortic atherosclerosis.   Allergies  Allergen Reactions  . Valium [Diazepam] Other (See Comments)    HYPERACTIVITY   . Amiodarone Hcl [Amiodarone] Other (See Comments)    AFIB  . Compazine [Prochlorperazine] Other (See Comments)    HYPERACTIVITY   . Other Other (See Comments)    "Kidney dye"--patient states this gave her severe shakes/chills  . Thorazine [Chlorpromazine] Other (See Comments)    HYPERACTIVITY   . Zometa [Zoledronic Acid] Other (See Comments)    PROBLEMS WITH EYES  TIA  . Ciprofloxacin Swelling    "swelling" in Rt.elbow    Immunization History  Administered Date(s) Administered  . Influenza,inj,Quad PF,6+ Mos 06/16/2019  . Influenza-Unspecified 07/01/2020  . PFIZER SARS-COV-2 Vaccination 10/17/2019, 11/12/2019, 07/07/2020  . Zoster Recombinat (Shingrix) 09/18/2017, 02/19/2018  Past Medical History:  Diagnosis Date  . Afib ([place])   . Arthritis of ankle   . CAD (coronary artery disease)   . Cancer [facility]) 2012   breast cancer-right  . Compression fracture of T12 vertebra (HCC)   . Diverticulosis   . Edema   . Elevated  uric acid in blood   . GERD (gastroesophageal reflux disease)   . GI bleed 12/2015  . Hyperlipidemia   . Hypertension   . Long-term (current) use of anticoagulants   . Moderate mitral regurgitation   . Pulmonary HTN ([place])    Echo 1/19: EF 60-65, no RWMA, Ao sclerosis, trivial AI, mild MR, mod BAE, mod TR, PASP 49  . Stroke ([place]) 01/25/2016  . TIA (transient ischemic attack) 09/2011  . Urinary incontinence   . Urinary retention     Tobacco History: Social History   Tobacco Use  Smoking Status Former Smoker  . Packs/day: 0.25  . Years: 2.00  . Pack years: 0.50  . Types: Cigarettes  . Quit date: 74  . Years since quitting: 71.8  Smokeless Tobacco Never Used   Counseling given: Not Answered   Outpatient Medications Prior to Visit  Medication Sig Dispense Refill  . acetaminophen (TYLENOL) 500 MG tablet Take 500-1,000 mg by mouth every 12 (twelve) hours as needed (pain).     Marland Kitchen albuterol (VENTOLIN HFA) 108 (90 Base) MCG/ACT inhaler Inhale 1 puff into the lungs every 6 (six) hours as needed for wheezing or shortness of breath (Cough). 18 g 2  . Ascorbic Acid (VITAMIN C WITH ROSE HIPS) 500 MG tablet Take 500 mg by mouth in the morning and at bedtime.    Marland Kitchen atorvastatin (LIPITOR) 10 MG tablet TAKE 1 TABLET DAILY 90 tablet 0  . Calcium Citrate-Vitamin D (SM CALCIUM CITRATE+D3 PETITE PO) Take 1 tablet by mouth in the morning, at noon, and at bedtime.    . cefaclor (CECLOR) 250 MG capsule Take 1 capsule (250 mg total) by mouth 3 (three) times daily. 15 capsule 0  . diltiazem (CARDIZEM) 60 MG tablet TAKE 1 TABLET THREE TIMES A DAY 270 tablet 0  . ELIQUIS 5 MG TABS tablet TAKE 1 TABLET TWICE A DAY 180 tablet 1  . metoprolol tartrate (LOPRESSOR) 100 MG tablet TAKE 1 TABLET TWICE A DAY 180 tablet 0  . Multiple Vitamin (MULTIVITAMIN WITH MINERALS) TABS tablet Take 1 tablet by mouth daily. Centrum for Women 50+    . nitrofurantoin (MACRODANTIN) 100 MG capsule Take 100 mg by mouth at bedtime.      Marland Kitchen omeprazole (PRILOSEC) 10 MG capsule Take 10 mg by mouth daily before breakfast.     . Probiotic Product (UP4 PROBIOTICS WOMENS PO) Take 1 capsule by mouth daily.    Marland Kitchen amoxicillin (AMOXIL) 500 MG capsule Take 2,000 mg by mouth See admin instructions. Take 4 capsules (2000 mg) by mouth 1 hour prior to dental appointments. (Patient not taking: Reported on 07/15/2020)    . benzonatate (TESSALON) 100 MG capsule Take 1 capsule (100 mg total) by mouth 2 (two) times daily as needed for cough. (Patient not taking: Reported on 07/15/2020) 30 capsule 1  . furosemide (LASIX) 20 MG tablet Take 1 tablet (20 mg total) by mouth daily. (Patient not taking: Reported on 07/15/2020) 90 tablet 3   No facility-administered medications prior to visit.    Review of Systems  Review of Systems  Constitutional: Negative.   Respiratory: Positive for cough. Negative for chest tightness, shortness of breath and wheezing.  DOE, dry cough  Cardiovascular: Negative.   Skin: Negative for color change and rash.    Physical Exam  BP 108/68 (BP Location: Left Arm, Patient Position: Sitting, Cuff Size: Normal)   Pulse 63   Temp (!) 97.5 F (36.4 C) (Temporal)   Ht 5\' 2"  (1.575 m)   Wt 145 lb 6.4 oz (66 kg)   LMP 09/12/1973 (Approximate)   SpO2 96%   BMI 26.59 kg/m  Physical Exam Constitutional:      Appearance: Normal appearance.  HENT:     Head: Normocephalic and atraumatic.     Mouth/Throat:     Comments: Deferred d/t masking Cardiovascular:     Rate and Rhythm: Normal rate and regular rhythm.  Pulmonary:     Effort: Pulmonary effort is normal.     Breath sounds: Normal breath sounds. No wheezing, rhonchi or rales.     Comments: CTA Neurological:     General: No focal deficit present.     Mental Status: She is alert and oriented to person, place, and time. Mental status is at baseline.  Psychiatric:        Mood and Affect: Mood normal.        Behavior: Behavior normal.        Thought Content:  Thought content normal.        Judgment: Judgment normal.      Lab Results:  CBC    Component Value Date/Time   WBC 9.8 05/12/2020 1240   RBC 4.10 05/12/2020 1240   HGB 12.3 05/12/2020 1240   HGB 12.4 05/06/2019 1608   HCT 37.8 05/12/2020 1240   HCT 36.6 05/06/2019 1608   PLT 287 05/12/2020 1240   PLT 237 05/06/2019 1608   MCV 92.2 05/12/2020 1240   MCV 92 05/06/2019 1608   MCH 30.0 05/12/2020 1240   MCHC 32.5 05/12/2020 1240   RDW 12.9 05/12/2020 1240   RDW 12.7 05/06/2019 1608   LYMPHSABS 1.3 05/12/2020 1240   MONOABS 0.9 05/12/2020 1240   EOSABS 0.1 05/12/2020 1240   BASOSABS 0.0 05/12/2020 1240    BMET    Component Value Date/Time   NA 136 05/12/2020 1240   NA 145 (H) 05/06/2019 1608   K 4.6 05/12/2020 1240   CL 102 05/12/2020 1240   CO2 26 05/12/2020 1240   GLUCOSE 139 (H) 05/12/2020 1240   BUN 19 05/12/2020 1240   BUN 19 05/06/2019 1608   CREATININE 1.11 (H) 05/12/2020 1240   CALCIUM 10.9 (H) 05/12/2020 1240   GFRNONAA 44 (L) 05/12/2020 1240   GFRAA 51 (L) 05/12/2020 1240    BNP    Component Value Date/Time   BNP 258.2 (H) 07/19/2016 1531    ProBNP    Component Value Date/Time   PROBNP 2,211 (H) 05/06/2019 1608    Imaging: CT Chest Wo Contrast  Result Date: 06/29/2020 CLINICAL DATA:  Follow-up lung nodule. Cough. Nonsmoker. History of breast cancer. EXAM: CT CHEST WITHOUT CONTRAST TECHNIQUE: Multidetector CT imaging of the chest was performed following the standard protocol without IV contrast. COMPARISON:  01/03/2020 and 09/09/2019 FINDINGS: Cardiovascular: Mild cardiac enlargement. No pericardial effusions. Calcification of the aorta and coronary arteries. No aortic aneurysm. Mediastinum/Nodes: Esophagus is decompressed. No significant lymphadenopathy. Lungs/Pleura: Scarring and pleural thickening along the anterior left lung, likely radiation change. Follow-up of pulmonary nodules: Right costophrenic angle nodule, series 8, image 116, measures  11 x 12 mm, unchanged. Right middle lobe nodule, image 77, measures 7 mm, unchanged Previous nodules demonstrated in the  posterior right upper lobe and in the lateral right middle lobe are no longer identified, likely indicating inflammatory etiology. No new nodules are seen. Upper Abdomen: Surgical absence of the gallbladder. Prominent duodenal diverticulum. No acute changes demonstrated in the visualized upper abdomen. Musculoskeletal: Postoperative right mastectomy. There is a new collection in the left breast measuring 4.3 x 7.1 cm. There are surgical clips in the area. It is likely that this represents a postoperative defect. Alternatively a developing cyst or abscess could be present. Skin thickening in the left breast is likely post radiation change. Degenerative changes throughout the spine. No destructive bone lesions. IMPRESSION: 1. Previous nodules in the posterior right upper lobe and in the lateral right middle lobe are no longer identified, likely indicating inflammatory etiology. 2. Follow-up of pulmonary nodules in the right lung base and right middle lobe are unchanged. No new nodules are seen. 3. Postoperative right mastectomy with new collection in the left breast measuring 4.3 x 7.1 cm. It is likely that this represents a postoperative defect. Alternatively a developing cyst or abscess could be present. 4. Skin thickening in the left breast is likely post radiation change. Radiation changes demonstrated in the anterior left lung. 5. Aortic atherosclerosis. Aortic Atherosclerosis (ICD10-I70.0). Electronically Signed   By: Lucienne Capers M.D.   On: 06/29/2020 22:22     Assessment & Plan:   Multiple pulmonary nodules - Repeat CT chest 06/29/20 appears stable. Pulmonary nodules RML/RLL are unchanged in size. Previous nodules RUL are no longer identified, likely inflammatory. NO new pulmonary nodules.  - Follow-up in 6-12 months   Malignant neoplasm of overlapping sites of right breast in  female, estrogen receptor positive ([place]) - CT chest in October 2021 showed new collection left breast measuring 4.3 x 7.1cm, likely postoperative defect but could represents developing cyst or abscess - She is scheduled for ultrasound left breast and follow-up with oncology afterwards    Martyn Ehrich, NP 07/17/2020

## 2020-07-15 NOTE — Patient Instructions (Addendum)
CT chest on 06/29/20 appears stable, nodules that were previously seen are unchanged in size. NO new nodules. Follow up with breast center regarding collection left breast.  Notify our office if you develop productive cough with colored mucus, increased shortness of breath or fevers   Follow-up: 6-8 months with Dr. Valeta Harms    Pulmonary Nodule A pulmonary nodule is tissue that has grown on your lung. A nodule may be cancer, but most nodules are not cancer. Follow these instructions at home:   Take over-the-counter and prescription medicines only as told by your doctor.  Do not use any products that have nicotine or tobacco, such as cigarettes and e-cigarettes. If you need help quitting, ask your doctor.  Keep all follow-up visits as told by your doctor. This is important. Contact a doctor if:  You have trouble breathing when doing activities.  You feel sick.  You feel more tired than normal.  You do not feel like eating.  You lose weight without trying.  You have chills.  You have night sweats. Get help right away if:  You cannot catch your breath.  You start making whistling sounds when breathing (wheezing).  You cannot stop coughing.  You cough up blood.  You get dizzy.  You feel like you are going to pass out (faint).  You have sudden chest pain.  You have a fever or symptoms for more than 2-3 days.  You have a fever and your symptoms suddenly get worse. Summary  A pulmonary nodule is tissue that has grown on your lung.  Most nodules are not cancer.  Your doctor will do tests to know what kind of nodule you have, and whether you need treatment for it. This information is not intended to replace advice given to you by your health care provider. Make sure you discuss any questions you have with your health care provider. Document Revised: 09/22/2017 Document Reviewed: 09/27/2016 Elsevier Patient Education  [place].

## 2020-07-17 ENCOUNTER — Encounter: Payer: Self-pay | Admitting: Primary Care

## 2020-07-17 NOTE — Progress Notes (Signed)
Thanks for seeing her Adrienne [name] [facility] Pulmonary Critical Care 07/17/2020 5:33 PM

## 2020-07-17 NOTE — Assessment & Plan Note (Signed)
-   Repeat CT chest 06/29/20 appears stable. Pulmonary nodules RML/RLL are unchanged in size. Previous nodules RUL are no longer identified, likely inflammatory. NO new pulmonary nodules.  - Follow-up in 6-12 months

## 2020-07-17 NOTE — Assessment & Plan Note (Signed)
-   CT chest in October 2021 showed new collection left breast measuring 4.3 x 7.1cm, likely postoperative defect but could represents developing cyst or abscess - She is scheduled for ultrasound left breast and follow-up with oncology afterwards

## 2020-07-20 ENCOUNTER — Other Ambulatory Visit: Payer: Self-pay | Admitting: Oncology

## 2020-07-31 ENCOUNTER — Other Ambulatory Visit: Payer: Self-pay

## 2020-07-31 ENCOUNTER — Ambulatory Visit
Admission: RE | Admit: 2020-07-31 | Discharge: 2020-07-31 | Disposition: A | Discharge status: Home / Self Care | Payer: Medicare Other | Source: Ambulatory Visit | Attending: Oncology | Admitting: Oncology

## 2020-07-31 ENCOUNTER — Other Ambulatory Visit: Payer: Self-pay | Admitting: Oncology

## 2020-07-31 ENCOUNTER — Ambulatory Visit: Payer: Medicare Other | Admitting: Oncology

## 2020-07-31 DIAGNOSIS — N6489 Other specified disorders of breast: Secondary | ICD-10-CM | POA: Diagnosis not present

## 2020-07-31 DIAGNOSIS — C50811 Malignant neoplasm of overlapping sites of right female breast: Secondary | ICD-10-CM

## 2020-07-31 DIAGNOSIS — Z170 Estrogen receptor positive status [ER+]: Secondary | ICD-10-CM

## 2020-07-31 DIAGNOSIS — Z853 Personal history of malignant neoplasm of breast: Secondary | ICD-10-CM | POA: Diagnosis not present

## 2020-07-31 DIAGNOSIS — R922 Inconclusive mammogram: Secondary | ICD-10-CM | POA: Diagnosis not present

## 2020-07-31 HISTORY — DX: Personal history of irradiation: Z92.3

## 2020-08-05 ENCOUNTER — Inpatient Hospital Stay: Payer: Medicare Other | Admitting: Oncology

## 2020-08-05 ENCOUNTER — Telehealth: Payer: Self-pay | Admitting: Oncology

## 2020-08-05 ENCOUNTER — Other Ambulatory Visit: Payer: Self-pay | Admitting: Oncology

## 2020-08-05 DIAGNOSIS — M25571 Pain in right ankle and joints of right foot: Secondary | ICD-10-CM | POA: Diagnosis not present

## 2020-08-05 DIAGNOSIS — S93421A Sprain of deltoid ligament of right ankle, initial encounter: Secondary | ICD-10-CM | POA: Diagnosis not present

## 2020-08-05 NOTE — Telephone Encounter (Signed)
Called pt per 11/24 sch msg - no answer. Left message for patient to call back to reschedule appt.

## 2020-08-07 ENCOUNTER — Telehealth: Payer: Self-pay | Admitting: Oncology

## 2020-08-07 ENCOUNTER — Telehealth: Payer: Self-pay | Admitting: Obstetrics & Gynecology

## 2020-08-07 NOTE — Telephone Encounter (Signed)
R/s 11/24 per sch msg. Called and spoke with patient. Confirmed appt

## 2020-08-07 NOTE — Telephone Encounter (Signed)
86 yo G3P3 MWF with hx of complete uterine prolapse who uses a #7 incontinence ring called reporting she's having vaginal bleeding.  Pt has been using a pessary for several years.  If she uses a smaller pessary or the pessary is left out, she has urinary retention and cannot void.  She has needed to use a urinary catheter in the past due to these issues.  Because of the urinary retention issues when the pessary is out, she pretty much keeps her pessary in the vaginal all of the time.  She reports she's had some light spotting off and on for about a year.   On exam, there has been some granulation tissue vaginally in the left lateral apex.  Today, pt reports vaginal bleeding started to increase to the point where tonight her urinary incontinence pad was completely saturated with bright red blood.  There are some small clots present as well.  She just went to the bathroom and she was dripping blood into the toilet.  She is not having any light headedness or SOB or palpitations.  Pt has afib and is on eliquis.  After discussing with pt possible options, she feels uncomfortable watching or monitoring without being seen so she is going to go to the ER for evaluation tonight.

## 2020-08-08 ENCOUNTER — Encounter (HOSPITAL_COMMUNITY)
Admission: EM | Disposition: A | Discharge status: Home Health | Payer: Self-pay | Source: Home / Self Care | Attending: Obstetrics & Gynecology

## 2020-08-08 ENCOUNTER — Inpatient Hospital Stay (HOSPITAL_COMMUNITY)
Admission: EM | Admit: 2020-08-08 | Discharge: 2020-08-11 | DRG: 988 | Disposition: A | Discharge status: Home Health | Payer: Medicare Other | Attending: Obstetrics & Gynecology | Admitting: Obstetrics & Gynecology

## 2020-08-08 ENCOUNTER — Other Ambulatory Visit: Payer: Self-pay

## 2020-08-08 ENCOUNTER — Observation Stay (HOSPITAL_COMMUNITY): Payer: Medicare Other | Admitting: Certified Registered Nurse Anesthetist

## 2020-08-08 ENCOUNTER — Encounter (HOSPITAL_COMMUNITY): Payer: Self-pay | Admitting: Obstetrics & Gynecology

## 2020-08-08 DIAGNOSIS — Z96653 Presence of artificial knee joint, bilateral: Secondary | ICD-10-CM | POA: Diagnosis present

## 2020-08-08 DIAGNOSIS — I2510 Atherosclerotic heart disease of native coronary artery without angina pectoris: Secondary | ICD-10-CM | POA: Diagnosis present

## 2020-08-08 DIAGNOSIS — Z780 Asymptomatic menopausal state: Secondary | ICD-10-CM

## 2020-08-08 DIAGNOSIS — I10 Essential (primary) hypertension: Secondary | ICD-10-CM | POA: Diagnosis not present

## 2020-08-08 DIAGNOSIS — I482 Chronic atrial fibrillation, unspecified: Secondary | ICD-10-CM | POA: Diagnosis present

## 2020-08-08 DIAGNOSIS — Z170 Estrogen receptor positive status [ER+]: Secondary | ICD-10-CM | POA: Diagnosis not present

## 2020-08-08 DIAGNOSIS — R0600 Dyspnea, unspecified: Secondary | ICD-10-CM

## 2020-08-08 DIAGNOSIS — N813 Complete uterovaginal prolapse: Secondary | ICD-10-CM | POA: Diagnosis present

## 2020-08-08 DIAGNOSIS — N939 Abnormal uterine and vaginal bleeding, unspecified: Secondary | ICD-10-CM | POA: Diagnosis not present

## 2020-08-08 DIAGNOSIS — J9610 Chronic respiratory failure, unspecified whether with hypoxia or hypercapnia: Secondary | ICD-10-CM | POA: Diagnosis not present

## 2020-08-08 DIAGNOSIS — Z20822 Contact with and (suspected) exposure to covid-19: Secondary | ICD-10-CM | POA: Diagnosis not present

## 2020-08-08 DIAGNOSIS — R918 Other nonspecific abnormal finding of lung field: Secondary | ICD-10-CM | POA: Diagnosis present

## 2020-08-08 DIAGNOSIS — A58 Granuloma inguinale: Secondary | ICD-10-CM | POA: Diagnosis present

## 2020-08-08 DIAGNOSIS — E785 Hyperlipidemia, unspecified: Secondary | ICD-10-CM | POA: Diagnosis present

## 2020-08-08 DIAGNOSIS — I700 Atherosclerosis of aorta: Secondary | ICD-10-CM | POA: Diagnosis present

## 2020-08-08 DIAGNOSIS — D62 Acute posthemorrhagic anemia: Secondary | ICD-10-CM | POA: Diagnosis present

## 2020-08-08 DIAGNOSIS — Z9981 Dependence on supplemental oxygen: Secondary | ICD-10-CM | POA: Diagnosis not present

## 2020-08-08 DIAGNOSIS — N814 Uterovaginal prolapse, unspecified: Secondary | ICD-10-CM | POA: Diagnosis not present

## 2020-08-08 DIAGNOSIS — Z7901 Long term (current) use of anticoagulants: Secondary | ICD-10-CM

## 2020-08-08 DIAGNOSIS — I340 Nonrheumatic mitral (valve) insufficiency: Secondary | ICD-10-CM | POA: Diagnosis present

## 2020-08-08 DIAGNOSIS — Z515 Encounter for palliative care: Secondary | ICD-10-CM | POA: Diagnosis not present

## 2020-08-08 DIAGNOSIS — E869 Volume depletion, unspecified: Secondary | ICD-10-CM | POA: Diagnosis not present

## 2020-08-08 DIAGNOSIS — I4821 Permanent atrial fibrillation: Secondary | ICD-10-CM | POA: Diagnosis present

## 2020-08-08 DIAGNOSIS — T8389XA Other specified complication of genitourinary prosthetic devices, implants and grafts, initial encounter: Principal | ICD-10-CM | POA: Diagnosis present

## 2020-08-08 DIAGNOSIS — Z792 Long term (current) use of antibiotics: Secondary | ICD-10-CM

## 2020-08-08 DIAGNOSIS — N938 Other specified abnormal uterine and vaginal bleeding: Secondary | ICD-10-CM | POA: Diagnosis not present

## 2020-08-08 DIAGNOSIS — C50811 Malignant neoplasm of overlapping sites of right female breast: Secondary | ICD-10-CM | POA: Diagnosis not present

## 2020-08-08 DIAGNOSIS — Z87891 Personal history of nicotine dependence: Secondary | ICD-10-CM

## 2020-08-08 DIAGNOSIS — K219 Gastro-esophageal reflux disease without esophagitis: Secondary | ICD-10-CM | POA: Diagnosis present

## 2020-08-08 DIAGNOSIS — M79672 Pain in left foot: Secondary | ICD-10-CM | POA: Diagnosis not present

## 2020-08-08 DIAGNOSIS — I480 Paroxysmal atrial fibrillation: Secondary | ICD-10-CM | POA: Diagnosis not present

## 2020-08-08 DIAGNOSIS — N898 Other specified noninflammatory disorders of vagina: Secondary | ICD-10-CM | POA: Diagnosis not present

## 2020-08-08 DIAGNOSIS — Z881 Allergy status to other antibiotic agents status: Secondary | ICD-10-CM

## 2020-08-08 DIAGNOSIS — Z7189 Other specified counseling: Secondary | ICD-10-CM

## 2020-08-08 DIAGNOSIS — Z8673 Personal history of transient ischemic attack (TIA), and cerebral infarction without residual deficits: Secondary | ICD-10-CM

## 2020-08-08 DIAGNOSIS — Z923 Personal history of irradiation: Secondary | ICD-10-CM

## 2020-08-08 DIAGNOSIS — Z825 Family history of asthma and other chronic lower respiratory diseases: Secondary | ICD-10-CM

## 2020-08-08 DIAGNOSIS — Z803 Family history of malignant neoplasm of breast: Secondary | ICD-10-CM

## 2020-08-08 DIAGNOSIS — I4891 Unspecified atrial fibrillation: Secondary | ICD-10-CM | POA: Diagnosis present

## 2020-08-08 DIAGNOSIS — Z853 Personal history of malignant neoplasm of breast: Secondary | ICD-10-CM

## 2020-08-08 DIAGNOSIS — Y761 Therapeutic (nonsurgical) and rehabilitative obstetric and gynecological devices associated with adverse incidents: Secondary | ICD-10-CM | POA: Diagnosis present

## 2020-08-08 DIAGNOSIS — R338 Other retention of urine: Secondary | ICD-10-CM | POA: Diagnosis not present

## 2020-08-08 DIAGNOSIS — Z87440 Personal history of urinary (tract) infections: Secondary | ICD-10-CM

## 2020-08-08 DIAGNOSIS — Z171 Estrogen receptor negative status [ER-]: Secondary | ICD-10-CM

## 2020-08-08 DIAGNOSIS — Z79899 Other long term (current) drug therapy: Secondary | ICD-10-CM

## 2020-08-08 DIAGNOSIS — Z9011 Acquired absence of right breast and nipple: Secondary | ICD-10-CM

## 2020-08-08 DIAGNOSIS — Z823 Family history of stroke: Secondary | ICD-10-CM

## 2020-08-08 DIAGNOSIS — R197 Diarrhea, unspecified: Secondary | ICD-10-CM | POA: Diagnosis not present

## 2020-08-08 DIAGNOSIS — I272 Pulmonary hypertension, unspecified: Secondary | ICD-10-CM | POA: Diagnosis present

## 2020-08-08 DIAGNOSIS — Z800 Family history of malignant neoplasm of digestive organs: Secondary | ICD-10-CM

## 2020-08-08 DIAGNOSIS — Z888 Allergy status to other drugs, medicaments and biological substances status: Secondary | ICD-10-CM

## 2020-08-08 DIAGNOSIS — I2583 Coronary atherosclerosis due to lipid rich plaque: Secondary | ICD-10-CM | POA: Diagnosis present

## 2020-08-08 DIAGNOSIS — C50212 Malignant neoplasm of upper-inner quadrant of left female breast: Secondary | ICD-10-CM

## 2020-08-08 HISTORY — PX: CERVICAL CONIZATION W/BX: SHX1330

## 2020-08-08 LAB — CBC WITH DIFFERENTIAL/PLATELET
Abs Immature Granulocytes: 0.04 10*3/uL (ref 0.00–0.07)
Basophils Absolute: 0 10*3/uL (ref 0.0–0.1)
Basophils Relative: 0 %
Eosinophils Absolute: 0.2 10*3/uL (ref 0.0–0.5)
Eosinophils Relative: 2 %
HCT: 34.8 % — ABNORMAL LOW (ref 36.0–46.0)
Hemoglobin: 11 g/dL — ABNORMAL LOW (ref 12.0–15.0)
Immature Granulocytes: 0 %
Lymphocytes Relative: 13 %
Lymphs Abs: 1.3 10*3/uL (ref 0.7–4.0)
MCH: 30.2 pg (ref 26.0–34.0)
MCHC: 31.6 g/dL (ref 30.0–36.0)
MCV: 95.6 fL (ref 80.0–100.0)
Monocytes Absolute: 1 10*3/uL (ref 0.1–1.0)
Monocytes Relative: 10 %
Neutro Abs: 7.4 10*3/uL (ref 1.7–7.7)
Neutrophils Relative %: 75 %
Platelets: 245 10*3/uL (ref 150–400)
RBC: 3.64 MIL/uL — ABNORMAL LOW (ref 3.87–5.11)
RDW: 14 % (ref 11.5–15.5)
WBC: 9.9 10*3/uL (ref 4.0–10.5)
nRBC: 0 % (ref 0.0–0.2)

## 2020-08-08 LAB — RESP PANEL BY RT-PCR (FLU A&B, COVID) ARPGX2
Influenza A by PCR: NEGATIVE
Influenza B by PCR: NEGATIVE
SARS Coronavirus 2 by RT PCR: NEGATIVE

## 2020-08-08 LAB — I-STAT CHEM 8, ED
BUN: 16 mg/dL (ref 8–23)
Calcium, Ion: 1.19 mmol/L (ref 1.15–1.40)
Chloride: 101 mmol/L (ref 98–111)
Creatinine, Ser: 1.1 mg/dL — ABNORMAL HIGH (ref 0.44–1.00)
Glucose, Bld: 112 mg/dL — ABNORMAL HIGH (ref 70–99)
HCT: 25 % — ABNORMAL LOW (ref 36.0–46.0)
Hemoglobin: 8.5 g/dL — ABNORMAL LOW (ref 12.0–15.0)
Potassium: 4.1 mmol/L (ref 3.5–5.1)
Sodium: 140 mmol/L (ref 135–145)
TCO2: 27 mmol/L (ref 22–32)

## 2020-08-08 LAB — COMPREHENSIVE METABOLIC PANEL
ALT: 14 U/L (ref 0–44)
AST: 17 U/L (ref 15–41)
Albumin: 3.3 g/dL — ABNORMAL LOW (ref 3.5–5.0)
Alkaline Phosphatase: 79 U/L (ref 38–126)
Anion gap: 11 (ref 5–15)
BUN: 16 mg/dL (ref 8–23)
CO2: 26 mmol/L (ref 22–32)
Calcium: 9.1 mg/dL (ref 8.9–10.3)
Chloride: 101 mmol/L (ref 98–111)
Creatinine, Ser: 1.17 mg/dL — ABNORMAL HIGH (ref 0.44–1.00)
GFR, Estimated: 44 mL/min — ABNORMAL LOW (ref >60)
Glucose, Bld: 166 mg/dL — ABNORMAL HIGH (ref 70–99)
Potassium: 4.2 mmol/L (ref 3.5–5.1)
Sodium: 138 mmol/L (ref 135–145)
Total Bilirubin: 0.7 mg/dL (ref 0.3–1.2)
Total Protein: 6.5 g/dL (ref 6.5–8.1)

## 2020-08-08 LAB — HEMOGLOBIN AND HEMATOCRIT, BLOOD
HCT: 27 % — ABNORMAL LOW (ref 36.0–46.0)
Hemoglobin: 8.9 g/dL — ABNORMAL LOW (ref 12.0–15.0)

## 2020-08-08 LAB — PROTIME-INR
INR: 1.5 — ABNORMAL HIGH (ref 0.8–1.2)
Prothrombin Time: 17.5 seconds — ABNORMAL HIGH (ref 11.4–15.2)

## 2020-08-08 LAB — PREPARE RBC (CROSSMATCH)

## 2020-08-08 LAB — FIBRINOGEN: Fibrinogen: 304 mg/dL (ref 210–475)

## 2020-08-08 SURGERY — EXAM UNDER ANESTHESIA
Anesthesia: General

## 2020-08-08 MED ORDER — DIPHENHYDRAMINE HCL 50 MG/ML IJ SOLN
12.5000 mg | Freq: Once | INTRAMUSCULAR | Status: AC
  Administered 2020-08-08: 12.5 mg via INTRAVENOUS
  Filled 2020-08-08: qty 1

## 2020-08-08 MED ORDER — FENTANYL CITRATE (PF) 250 MCG/5ML IJ SOLN
INTRAMUSCULAR | Status: DC | PRN
  Administered 2020-08-08 (×2): 25 ug via INTRAVENOUS

## 2020-08-08 MED ORDER — DILTIAZEM HCL 60 MG PO TABS
60.0000 mg | ORAL_TABLET | Freq: Three times a day (TID) | ORAL | Status: DC
  Administered 2020-08-08 – 2020-08-11 (×10): 60 mg via ORAL
  Filled 2020-08-08 (×10): qty 1

## 2020-08-08 MED ORDER — LACTATED RINGERS IV SOLN: INTRAVENOUS | Status: DC

## 2020-08-08 MED ORDER — FUROSEMIDE 20 MG PO TABS: 20.0000 mg | ORAL_TABLET | Freq: Every day | ORAL | Status: DC

## 2020-08-08 MED ORDER — DEXTROSE-NACL 5-0.45 % IV SOLN: INTRAVENOUS | Status: DC

## 2020-08-08 MED ORDER — SULFAMETHOXAZOLE-TRIMETHOPRIM 400-80 MG PO TABS
1.0000 | ORAL_TABLET | Freq: Every day | ORAL | Status: DC
  Administered 2020-08-09 – 2020-08-11 (×3): 1 via ORAL
  Filled 2020-08-08 (×3): qty 1

## 2020-08-08 MED ORDER — FENTANYL CITRATE (PF) 100 MCG/2ML IJ SOLN: 25.0000 ug | INTRAMUSCULAR | Status: DC | PRN

## 2020-08-08 MED ORDER — MENTHOL 3 MG MT LOZG
1.0000 | LOZENGE | OROMUCOSAL | Status: DC | PRN
  Filled 2020-08-08: qty 9

## 2020-08-08 MED ORDER — FENTANYL CITRATE (PF) 250 MCG/5ML IJ SOLN
INTRAMUSCULAR | Status: AC
  Filled 2020-08-08: qty 5

## 2020-08-08 MED ORDER — METOPROLOL TARTRATE 100 MG PO TABS
100.0000 mg | ORAL_TABLET | Freq: Two times a day (BID) | ORAL | Status: DC
  Administered 2020-08-08 – 2020-08-11 (×6): 100 mg via ORAL
  Filled 2020-08-08 (×7): qty 1

## 2020-08-08 MED ORDER — PANTOPRAZOLE SODIUM 40 MG PO TBEC
40.0000 mg | DELAYED_RELEASE_TABLET | Freq: Every day | ORAL | Status: DC
  Administered 2020-08-09 – 2020-08-11 (×3): 40 mg via ORAL
  Filled 2020-08-08 (×3): qty 1

## 2020-08-08 MED ORDER — BUPIVACAINE HCL (PF) 0.5 % IJ SOLN
INTRAMUSCULAR | Status: AC
  Filled 2020-08-08: qty 30

## 2020-08-08 MED ORDER — ONDANSETRON HCL 4 MG/2ML IJ SOLN: 4.0000 mg | Freq: Four times a day (QID) | INTRAMUSCULAR | Status: DC | PRN

## 2020-08-08 MED ORDER — SULFAMETHOXAZOLE-TRIMETHOPRIM 400-80 MG PO TABS: 1.0000 | ORAL_TABLET | Freq: Once | ORAL | Status: DC

## 2020-08-08 MED ORDER — 0.9 % SODIUM CHLORIDE (POUR BTL) OPTIME
TOPICAL | Status: DC | PRN
  Administered 2020-08-08: 1000 mL

## 2020-08-08 MED ORDER — ACETAMINOPHEN 500 MG PO TABS: 1000.0000 mg | ORAL_TABLET | Freq: Once | ORAL | Status: DC | PRN

## 2020-08-08 MED ORDER — ONDANSETRON HCL 4 MG/2ML IJ SOLN
INTRAMUSCULAR | Status: AC
  Filled 2020-08-08: qty 2

## 2020-08-08 MED ORDER — ACETAMINOPHEN 325 MG PO TABS
650.0000 mg | ORAL_TABLET | Freq: Once | ORAL | Status: AC
  Administered 2020-08-08: 650 mg via ORAL
  Filled 2020-08-08: qty 2

## 2020-08-08 MED ORDER — ACETAMINOPHEN 160 MG/5ML PO SOLN: 1000.0000 mg | Freq: Once | ORAL | Status: DC | PRN

## 2020-08-08 MED ORDER — OXYCODONE HCL 5 MG/5ML PO SOLN: 5.0000 mg | Freq: Once | ORAL | Status: DC | PRN

## 2020-08-08 MED ORDER — PROPOFOL 10 MG/ML IV BOLUS
INTRAVENOUS | Status: DC | PRN
  Administered 2020-08-08: 25 ug/kg/min via INTRAVENOUS

## 2020-08-08 MED ORDER — THROMBIN 20000 UNITS EX KIT
PACK | CUTANEOUS | Status: AC
  Filled 2020-08-08: qty 1

## 2020-08-08 MED ORDER — PROPOFOL 10 MG/ML IV BOLUS
INTRAVENOUS | Status: DC | PRN
  Administered 2020-08-08: 20 mg via INTRAVENOUS
  Administered 2020-08-08: 50 mg via INTRAVENOUS

## 2020-08-08 MED ORDER — ACETAMINOPHEN 325 MG PO TABS
650.0000 mg | ORAL_TABLET | ORAL | Status: DC | PRN
  Administered 2020-08-11: 650 mg via ORAL
  Filled 2020-08-08: qty 2

## 2020-08-08 MED ORDER — SIMETHICONE 80 MG PO CHEW: 80.0000 mg | CHEWABLE_TABLET | Freq: Four times a day (QID) | ORAL | Status: DC | PRN

## 2020-08-08 MED ORDER — PHENYLEPHRINE 40 MCG/ML (10ML) SYRINGE FOR IV PUSH (FOR BLOOD PRESSURE SUPPORT)
PREFILLED_SYRINGE | INTRAVENOUS | Status: DC | PRN
  Administered 2020-08-08 (×3): 80 ug via INTRAVENOUS
  Administered 2020-08-08 (×2): 160 ug via INTRAVENOUS

## 2020-08-08 MED ORDER — PROPOFOL 10 MG/ML IV BOLUS
INTRAVENOUS | Status: AC
  Filled 2020-08-08: qty 20

## 2020-08-08 MED ORDER — ATORVASTATIN CALCIUM 10 MG PO TABS
10.0000 mg | ORAL_TABLET | Freq: Every day | ORAL | Status: DC
  Administered 2020-08-09 – 2020-08-11 (×3): 10 mg via ORAL
  Filled 2020-08-08 (×3): qty 1

## 2020-08-08 MED ORDER — MIDAZOLAM HCL 2 MG/2ML IJ SOLN
INTRAMUSCULAR | Status: AC
  Filled 2020-08-08: qty 2

## 2020-08-08 MED ORDER — SODIUM CHLORIDE 0.9% IV SOLUTION
Freq: Once | INTRAVENOUS | Status: AC
  Administered 2020-08-08: 200 mL via INTRAVENOUS

## 2020-08-08 MED ORDER — NITROFURANTOIN MACROCRYSTAL 100 MG PO CAPS: 100.0000 mg | ORAL_CAPSULE | Freq: Every day | ORAL | Status: DC

## 2020-08-08 MED ORDER — ACETAMINOPHEN 10 MG/ML IV SOLN: 1000.0000 mg | Freq: Once | INTRAVENOUS | Status: DC | PRN

## 2020-08-08 MED ORDER — ALBUTEROL SULFATE HFA 108 (90 BASE) MCG/ACT IN AERS
1.0000 | INHALATION_SPRAY | Freq: Four times a day (QID) | RESPIRATORY_TRACT | Status: DC | PRN
  Filled 2020-08-08: qty 6.7

## 2020-08-08 MED ORDER — ONDANSETRON HCL 4 MG/2ML IJ SOLN
INTRAMUSCULAR | Status: DC | PRN
  Administered 2020-08-08: 4 mg via INTRAVENOUS

## 2020-08-08 MED ORDER — ESTRADIOL 0.1 MG/GM VA CREA
TOPICAL_CREAM | VAGINAL | Status: DC | PRN
  Administered 2020-08-08: 1 via VAGINAL

## 2020-08-08 MED ORDER — PHENYLEPHRINE HCL-NACL 10-0.9 MG/250ML-% IV SOLN
INTRAVENOUS | Status: DC | PRN
  Administered 2020-08-08: 50 ug/min via INTRAVENOUS

## 2020-08-08 MED ORDER — ESTRADIOL 0.1 MG/GM VA CREA
TOPICAL_CREAM | VAGINAL | Status: AC
  Filled 2020-08-08: qty 42.5

## 2020-08-08 MED ORDER — ONDANSETRON HCL 4 MG PO TABS: 4.0000 mg | ORAL_TABLET | Freq: Four times a day (QID) | ORAL | Status: DC | PRN

## 2020-08-08 MED ORDER — PROPOFOL 1000 MG/100ML IV EMUL
INTRAVENOUS | Status: AC
  Filled 2020-08-08: qty 100

## 2020-08-08 MED ORDER — LIDOCAINE-EPINEPHRINE 2 %-1:100000 IJ SOLN
INTRAMUSCULAR | Status: AC
  Filled 2020-08-08: qty 1

## 2020-08-08 MED ORDER — OXYCODONE HCL 5 MG PO TABS: 5.0000 mg | ORAL_TABLET | Freq: Once | ORAL | Status: DC | PRN

## 2020-08-08 MED ORDER — CEFAZOLIN SODIUM-DEXTROSE 1-4 GM/50ML-% IV SOLN
1.0000 g | Freq: Once | INTRAVENOUS | Status: DC
  Filled 2020-08-08: qty 50

## 2020-08-08 SURGICAL SUPPLY — 19 items
BAG SPECIMEN  6X9 BHZR ZP PCKT (MISCELLANEOUS) ×2
BAG SPECIMEN 6X9 BHZR ZP PCKT (MISCELLANEOUS) ×1 IMPLANT
CANISTER SUCT 3000ML PPV (MISCELLANEOUS) ×3 IMPLANT
CONT SPEC 4OZ CLIKSEAL STRL BL (MISCELLANEOUS) ×3 IMPLANT
ELECT BLADE INSULATED 6.5IN (ELECTROSURGICAL) ×3
ELECT REM PT RETURN 9FT ADLT (ELECTROSURGICAL) ×3
ELECTRODE BLDE INSULATED 6.5IN (ELECTROSURGICAL) ×1 IMPLANT
ELECTRODE REM PT RTRN 9FT ADLT (ELECTROSURGICAL) ×1 IMPLANT
GAUZE PACKING 2X5 YD STRL (GAUZE/BANDAGES/DRESSINGS) ×6 IMPLANT
GLOVE BIO SURGEON STRL SZ7 (GLOVE) ×6 IMPLANT
GLOVE INDICATOR 7.0 STRL GRN (GLOVE) ×6 IMPLANT
KIT TURNOVER KIT B (KITS) ×3 IMPLANT
PACK VAGINAL MINOR WOMEN LF (CUSTOM PROCEDURE TRAY) ×3 IMPLANT
SURGIFLO W/THROMBIN 8M KIT (HEMOSTASIS) ×6 IMPLANT
SURGILUBE 2OZ TUBE FLIPTOP (MISCELLANEOUS) ×3 IMPLANT
SUT VIC AB 2-0 CT1 (SUTURE) ×3 IMPLANT
SUT VIC AB 3-0 SH 27 (SUTURE) ×4
SUT VIC AB 3-0 SH 27X BRD (SUTURE) ×2 IMPLANT
SYR BULB IRRIG 60ML STRL (SYRINGE) ×3 IMPLANT

## 2020-08-08 NOTE — ED Notes (Signed)
#  16 french foley inserted per Dr. Sabra Heck , GYN

## 2020-08-08 NOTE — H&P (Addendum)
Adrienne [name] is an 86 y.o. female G3P3 MWF with hx of complete uterine prolapse and pessary use with #7 incontinence ring with support who called me late last night with complaint of vaginal bleeding.  Pt has known area of granulation tissue in the vagina due to pessary.  However, when the pessary has been removed in the past, the prolapse is significant enough to cause urinary retention requiring pessary use.  Pt reports she's had intermittent spotting for about the last year.  However, her bleeding increased yesterday evening and she soaked a pad and had dripping vaginal bleeding prior to being seen in the ER.  She was initially evaluated by Dr. Betsey Holiday who saw significant vaginal bleeding on exam and used trauma packing to help with bleeding.  He felt she needed admission so I as called.  Upon seeing the pt, she did not complain of any palpitations, SOB, lightheaded sensation, N/V/C, but did think she was still having vaginal bleeding.  Pt is on Eliquis due to afib and did take yesterday evening's dosage.    Pertinent Gynecological History: Menses: post-menopausal Bleeding: post menopausal bleeding Sexually transmitted diseases: no past history Previous GYN Procedures:breast lumpectomy Last mammogram: normal Date: 11/21 Last pap: normal Date: 02/2019 OB History: G3, P3   Menstrual History: Patient's last menstrual period was 09/12/1973 (approximate).    Past Medical History:  Diagnosis Date  . Afib ([place])   . Arthritis of ankle   . CAD (coronary artery disease)   . Cancer [facility]) 2012   breast cancer-right  . Compression fracture of T12 vertebra ([facility])   . Diverticulosis   . Edema   . Elevated uric acid in blood   . GERD (gastroesophageal reflux disease)   . GI bleed 12/2015  . Hyperlipidemia   . Hypertension   . Long-term (current) use of anticoagulants   . Moderate mitral regurgitation   . Personal history of radiation therapy   . Pulmonary HTN ([place])    Echo 1/19: EF  60-65, no RWMA, Ao sclerosis, trivial AI, mild MR, mod BAE, mod TR, PASP 49  . Stroke ([place]) 01/25/2016  . TIA (transient ischemic attack) 09/2011  . Urinary incontinence   . Urinary retention     Past Surgical History:  Procedure Laterality Date  . APPENDECTOMY    . AXILLARY SENTINEL NODE BIOPSY Left 01/22/2020   Procedure: Left Axillary Sentinel Node Biopsy;  Surgeon: Rolm Bookbinder, MD;  Location: [place];  Service: General;  Laterality: Left;  . BREAST LUMPECTOMY WITH RADIOACTIVE SEED AND SENTINEL LYMPH NODE BIOPSY Left 01/22/2020   Procedure: LEFT BREAST LUMPECTOMY WITH RADIOACTIVE SEED;  Surgeon: Rolm Bookbinder, MD;  Location: [place];  Service: General;  Laterality: Left;  . BREAST SURGERY  2012   Rt.mastectomy--[place], [place]  2013  . CATARACT EXTRACTION, BILATERAL    . DILATION AND CURETTAGE OF UTERUS     in her early 98's for DUB  . ESOPHAGOGASTRODUODENOSCOPY Left 01/08/2016   Procedure: ESOPHAGOGASTRODUODENOSCOPY (EGD);  Surgeon: Arta Silence, MD;  Location: [facility];  Service: Endoscopy;  Laterality: Left;  . GALLBLADDER SURGERY    . MASTECTOMY Right   . RADIOLOGY WITH ANESTHESIA N/A 01/25/2016   Procedure: RADIOLOGY WITH ANESTHESIA;  Surgeon: Luanne Bras, MD;  Location: [place] [facility];  Service: Radiology;  Laterality: N/A;  . REPLACEMENT TOTAL KNEE BILATERAL    . TONSILLECTOMY AND ADENOIDECTOMY     as a child    Family History  Problem Relation Age of Onset  .  Asthma Mother   . CVA Father   . Atrial fibrillation Sister        HAS PACER  . Pneumonia Brother   . Cancer Daughter        COLON CANCER  . Cancer Maternal Grandmother        breast cancer    Social History:  reports that she quit smoking about 71 years ago. Her smoking use included cigarettes. She has a 0.50 pack-year smoking history. She has never used smokeless tobacco. She reports that she does not drink alcohol and does not use drugs.  Allergies:  Allergies  Allergen  Reactions  . Valium [Diazepam] Other (See Comments)    HYPERACTIVITY   . Amiodarone Hcl [Amiodarone] Other (See Comments)    AFIB  . Compazine [Prochlorperazine] Other (See Comments)    HYPERACTIVITY   . Other Other (See Comments)    "Kidney dye"--patient states this gave her severe shakes/chills  . Thorazine [Chlorpromazine] Other (See Comments)    HYPERACTIVITY   . Zometa [Zoledronic Acid] Other (See Comments)    PROBLEMS WITH EYES  TIA  . Ciprofloxacin Swelling    "swelling" in Rt.elbow    (Not in a hospital admission)   Review of Systems  Constitutional: Negative.   Respiratory: Negative.   Cardiovascular: Negative.   Gastrointestinal: Negative.   Genitourinary: Negative.   Musculoskeletal: Negative.   Skin: Negative.   Neurological: Negative.   Hematological: Bruises/bleeds easily.  Psychiatric/Behavioral: Negative.     Blood pressure (!) 84/56, pulse 91, temperature 97.6 F (36.4 C), temperature source Oral, resp. rate (!) 24, height 5' (1.524 m), weight 66.2 kg, last menstrual period 09/12/1973, SpO2 97 %. Physical Exam Exam conducted with a chaperone present.  Constitutional:      Appearance: Normal appearance.  HENT:     Head: Normocephalic and atraumatic.  Cardiovascular:     Rate and Rhythm: Rhythm irregular.     Heart sounds: Normal heart sounds.  Pulmonary:     Effort: Pulmonary effort is normal.     Breath sounds: Normal breath sounds.  Abdominal:     General: Abdomen is flat. Bowel sounds are normal. There is no distension.     Palpations: Abdomen is soft. There is no mass.     Hernia: There is no hernia in the left inguinal area or right inguinal area.  Genitourinary:    General: Normal vulva.     Exam position: Lithotomy position.     Labia:        Right: No lesion.        Left: No lesion.      Urethra: No prolapse or urethral lesion.     Vagina: Bleeding, lesions (3cm area of granulation tissue that is extending from the right sidewall)  and prolapsed vaginal walls present. No tenderness.     Cervix: Normal.     Uterus: Normal.   Musculoskeletal:        General: Normal range of motion.     Cervical back: Normal range of motion. No tenderness.  Lymphadenopathy:     Cervical: No cervical adenopathy.     Lower Body: No right inguinal adenopathy. No left inguinal adenopathy.  Skin:    General: Skin is warm and dry.     Coloration: Skin is pale.  Neurological:     General: No focal deficit present.     Mental Status: She is alert.  Psychiatric:        Mood and Affect: Mood normal.  Significant vaginal bleeding noted with speculum exam.  Foley catheter placed.  Evacuation of prior packing and clot was removed.  Large softball sized clot was present.  3cm lesion of granulation tissue noted with active bleeding.  Surgiflo was applied around area of active bleeding with significant improvement in bleeding noted.  Saline soaked packing with Kerlex placed.  Pt tolerated procedure well.  Results for orders placed or performed during the hospital encounter of 08/08/20 (from the past 24 hour(s))  Type and screen [place]     Status: None (Preliminary result)   Collection Time: 08/08/20 12:45 AM  Result Value Ref Range   ABO/RH(D) B POS    Antibody Screen NEG    Sample Expiration      08/11/2020,2359 Performed at [facility], [place] [address] [place], [place] [address]    Unit Number [id number]    Blood Component Type RED CELLS,LR    Unit division 00    Status of Unit ALLOCATED    Transfusion Status OK TO TRANSFUSE    Crossmatch Result Compatible    Unit Number [id number]    Blood Component Type RBC LR PHER2    Unit division 00    Status of Unit ALLOCATED    Transfusion Status OK TO TRANSFUSE    Crossmatch Result Compatible   CBC with Differential     Status: Abnormal   Collection Time: 08/08/20 12:55 AM  Result Value Ref Range   WBC 9.9 4.0 - 10.5 K/uL   RBC 3.64 (L) 3.87 - 5.11  MIL/uL   Hemoglobin 11.0 (L) 12.0 - 15.0 g/dL   HCT 34.8 (L) 36 - 46 %   MCV 95.6 80.0 - 100.0 fL   MCH 30.2 26.0 - 34.0 pg   MCHC 31.6 30.0 - 36.0 g/dL   RDW 14.0 11.5 - 15.5 %   Platelets 245 150 - 400 K/uL   nRBC 0.0 0.0 - 0.2 %   Neutrophils Relative % 75 %   Neutro Abs 7.4 1.7 - 7.7 K/uL   Lymphocytes Relative 13 %   Lymphs Abs 1.3 0.7 - 4.0 K/uL   Monocytes Relative 10 %   Monocytes Absolute 1.0 0.1 - 1.0 K/uL   Eosinophils Relative 2 %   Eosinophils Absolute 0.2 0.0 - 0.5 K/uL   Basophils Relative 0 %   Basophils Absolute 0.0 0.0 - 0.1 K/uL   Immature Granulocytes 0 %   Abs Immature Granulocytes 0.04 0.00 - 0.07 K/uL  Protime-INR     Status: Abnormal   Collection Time: 08/08/20 12:55 AM  Result Value Ref Range   Prothrombin Time 17.5 (H) 11.4 - 15.2 seconds   INR 1.5 (H) 0.8 - 1.2  Comprehensive metabolic panel     Status: Abnormal   Collection Time: 08/08/20  1:01 AM  Result Value Ref Range   Sodium 138 135 - 145 mmol/L   Potassium 4.2 3.5 - 5.1 mmol/L   Chloride 101 98 - 111 mmol/L   CO2 26 22 - 32 mmol/L   Glucose, Bld 166 (H) 70 - 99 mg/dL   BUN 16 8 - 23 mg/dL   Creatinine, Ser 1.17 (H) 0.44 - 1.00 mg/dL   Calcium 9.1 8.9 - 10.3 mg/dL   Total Protein 6.5 6.5 - 8.1 g/dL   Albumin 3.3 (L) 3.5 - 5.0 g/dL   AST 17 15 - 41 U/L   ALT 14 0 - 44 U/L   Alkaline Phosphatase 79 38 - 126 U/L  Total Bilirubin 0.7 0.3 - 1.2 mg/dL   GFR, Estimated 44 (L) >60 mL/min   Anion gap 11 5 - 15  Resp Panel by RT-PCR (Flu A&B, Covid) Nasopharyngeal Swab     Status: None   Collection Time: 08/08/20  2:20 AM   Specimen: Nasopharyngeal Swab; Nasopharyngeal(NP) swabs in vial transport medium  Result Value Ref Range   SARS Coronavirus 2 by RT PCR NEGATIVE NEGATIVE   Influenza A by PCR NEGATIVE NEGATIVE   Influenza B by PCR NEGATIVE NEGATIVE  I-stat chem 8, ed     Status: Abnormal   Collection Time: 08/08/20  5:39 AM  Result Value Ref Range   Sodium 140 135 - 145 mmol/L    Potassium 4.1 3.5 - 5.1 mmol/L   Chloride 101 98 - 111 mmol/L   BUN 16 8 - 23 mg/dL   Creatinine, Ser 1.10 (H) 0.44 - 1.00 mg/dL   Glucose, Bld 112 (H) 70 - 99 mg/dL   Calcium, Ion 1.19 1.15 - 1.40 mmol/L   TCO2 27 22 - 32 mmol/L   Hemoglobin 8.5 (L) 12.0 - 15.0 g/dL   HCT 25.0 (L) 36 - 46 %  Prepare RBC (crossmatch)     Status: None   Collection Time: 08/08/20  6:01 AM  Result Value Ref Range   Order Confirmation      ORDER PROCESSED BY BLOOD BANK Performed at [facility] [place] [facility], [address], [place], [place] [address]     No results found.  Assessment/Plan: 1.  Complete uterine prolapse with pessary use, large area of granulation tissue and active bleeding from granulation tissue.    Surgiflo applied in ER and packing present.  Will keep activity limited today.  2.  Afib.  Hold eliquis.  Hospitalist consult placed. Continue diltiazem. SCDs ordered.  3.  Anemia from blood loss in last 24 hours.  Transfuse 2 units slowly and plan post transfusion hemoglobin.  Will use tylenol and benadryl prior to transfusion.  4.  Hypertension.    On metoprolol.  Order placed.  5.  Urinary retention when pessary is out.    Creatinine clearance today is 32.  Bactrim SS ordered for prophylaxis with foley catheter that was placed in ER.      Megan Salon 08/08/2020, 6:25 AM   Addendum:  After about 2 hours, the packing became blood soaked with bleeding oozing and dripping at times from the vagina.  Decision made to proceed with exam under anesthesia and attempt to control bleeding in the OR recommended.  Procedure risks and benefits reviewed including bleeding, infection, bowel or bladder injury.  However, if pt continues to bleed, she is going to have risks of other issues so I dont' really feel like we have much choice other than to go to the OR.  Pt and husband agreed and OR was called.

## 2020-08-08 NOTE — Consult Note (Signed)
Medical Consultation   Adrienne [name]  WEX:[id number]  DOB: [birth year]  DOA: 08/08/2020  PCP: Kathyrn Lass, MD   Outpatient Specialists: Magrinat - oncology; Kinard - rad onc; Icard - pulmonology; Macdiarmid - urology; Marlou Porch - cardiology   Requesting physician: Sabra Heck - GYN  Reason for consultation: 86yo.  Has a pessary which has led to granulation tissue and bleeding.  Packed with wound gauze but it came out.  Used thrombin foam.  Hgb 11 on presentation.  Still bleeding.  Giving blood.  On Eliquis - holding.  Taking her to the OR to try to cauterize and stop bleeding.  Called for her to the OR so would like for patient to be seen post-operatively.   History of Present Illness: Adrienne [name] is an 86 y.o. female with h/o uterine prolapse requiring pessary use, with urinary retention if pessary is not in place; CVA; pulmonary HTN; HTN; HLD; remote breast cancer with recent recurrence; and afib on Eliquis presenting with vaginal bleeding.  The patient reports that the significant bleeding started last night about 9pm.  She has continued to have recurrent heavy bleeding despite attempts at packing in the ER.  She denies CP. She does have chronic SOB and was started on prn home O2 after she started radiation therapy for breast cancer after recurrence in May.    Review of Systems:  ROS As per HPI otherwise 10 point review of systems negative.    Past Medical History: Past Medical History:  Diagnosis Date  . Afib ([place])   . Arthritis of ankle   . CAD (coronary artery disease)   . Cancer [facility]) 2012   breast cancer-right  . Compression fracture of T12 vertebra (HCC)   . Diverticulosis   . Edema   . Elevated uric acid in blood   . GERD (gastroesophageal reflux disease)   . GI bleed 12/2015  . Hyperlipidemia   . Hypertension   . Long-term (current) use of anticoagulants   . Moderate mitral regurgitation   . Personal history of radiation therapy    . Pulmonary HTN ([place])    Echo 1/19: EF 60-65, no RWMA, Ao sclerosis, trivial AI, mild MR, mod BAE, mod TR, PASP 49  . Stroke ([place]) 01/25/2016  . TIA (transient ischemic attack) 09/2011  . Urinary incontinence   . Urinary retention     Past Surgical History: Past Surgical History:  Procedure Laterality Date  . APPENDECTOMY    . AXILLARY SENTINEL NODE BIOPSY Left 01/22/2020   Procedure: Left Axillary Sentinel Node Biopsy;  Surgeon: Rolm Bookbinder, MD;  Location: [place] [facility];  Service: General;  Laterality: Left;  . BREAST LUMPECTOMY WITH RADIOACTIVE SEED AND SENTINEL LYMPH NODE BIOPSY Left 01/22/2020   Procedure: LEFT BREAST LUMPECTOMY WITH RADIOACTIVE SEED;  Surgeon: Rolm Bookbinder, MD;  Location: [place];  Service: General;  Laterality: Left;  . BREAST SURGERY  2012   Rt.mastectomy--[place], [place]  2013  . CATARACT EXTRACTION, BILATERAL    . DILATION AND CURETTAGE OF UTERUS     in her early 53's for DUB  . ESOPHAGOGASTRODUODENOSCOPY Left 01/08/2016   Procedure: ESOPHAGOGASTRODUODENOSCOPY (EGD);  Surgeon: Arta Silence, MD;  Location: [facility];  Service: Endoscopy;  Laterality: Left;  . GALLBLADDER SURGERY    . MASTECTOMY Right   . RADIOLOGY WITH ANESTHESIA N/A 01/25/2016   Procedure: RADIOLOGY WITH ANESTHESIA;  Surgeon: Luanne Bras, MD;  Location: [facility];  Service: Radiology;  Laterality: N/A;  . REPLACEMENT TOTAL KNEE BILATERAL    . TONSILLECTOMY AND ADENOIDECTOMY     as a child     Allergies:   Allergies  Allergen Reactions  . Valium [Diazepam] Other (See Comments)    HYPERACTIVITY   . Amiodarone Hcl [Amiodarone] Other (See Comments)    AFIB  . Compazine [Prochlorperazine] Other (See Comments)    HYPERACTIVITY   . Other Other (See Comments)    "Kidney dye"--patient states this gave her severe shakes/chills  . Thorazine [Chlorpromazine] Other (See Comments)    HYPERACTIVITY   . Zometa [Zoledronic Acid] Other (See Comments)     PROBLEMS WITH EYES  TIA  . Ciprofloxacin Swelling    "swelling" in Rt.elbow     Social History:  reports that she quit smoking about 71 years ago. Her smoking use included cigarettes. She has a 0.50 pack-year smoking history. She has never used smokeless tobacco. She reports that she does not drink alcohol and does not use drugs.   Family History: Family History  Problem Relation Age of Onset  . Asthma Mother   . CVA Father   . Atrial fibrillation Sister        HAS PACER  . Pneumonia Brother   . Cancer Daughter        COLON CANCER  . Cancer Maternal Grandmother        breast cancer      Physical Exam: Vitals:   08/08/20 0515 08/08/20 0545 08/08/20 0648 08/08/20 0713  BP: (!) 96/52 (!) 84/56 (!) 99/52 (!) 107/50  Pulse: (!) 58 91 (!) 102 96  Resp: 19 (!) 24 16 16   Temp:   (!) 97.5 F (36.4 C) 98 F (36.7 C)  TempSrc:   Oral Oral  SpO2: 97% 97% 94% 97%  Weight:      Height:        Constitutional: Alert and awake, oriented x3, not in any acute distress. Eyes: PERLA, EOMI, irises appear normal, anicteric sclera,  ENMT: external ears and nose appear normal, normal hearing, Lips appear normal, oropharynx mucosa, tongue appear normal with mildly dry mucous membranes Neck: neck appears normal, no masses, normal ROM CVS: S1-S2 clear, no murmur rubs or gallops, 1+ LE edema, normal pedal pulses  Respiratory:  clear to auscultation bilaterally, no wheezing, rales or rhonchi. Respiratory effort normal. No accessory muscle use.  Abdomen: soft nontender, nondistended Musculoskeletal: : no cyanosis, clubbing or edema noted bilaterally Neuro: Cranial nerves II-XII intact, strength Psych: judgement and insight appear normal, stable mood and affect, mental status Skin: no rashes or lesions or ulcers, no induration or nodules    Data reviewed:  I have personally reviewed the recent labs and imaging studies  Pertinent Labs:   Glucose 166 BUN 16/Creatinine 1.17/GFR 44 -  stable WBC 9.9 Hgb 11.0 -> 8.5 INR 1.5 COVID/flu negative   Inpatient Medications:   Scheduled Meds: . [MAR Hold] diltiazem  60 mg Oral Q8H  . [MAR Hold] furosemide  20 mg Oral Daily  . [MAR Hold] metoprolol tartrate  100 mg Oral BID  . [MAR Hold] sulfamethoxazole-trimethoprim  1 tablet Oral Once   Continuous Infusions: . dextrose 5 % and 0.45% NaCl    . lactated ringers 10 mL/hr at 08/08/20 3846     Radiological Exams on Admission: No results found.  Impression/Recommendations Principal Problem:   Vaginal bleeding Active Problems:   Chronic atrial fibrillation (HCC)   Hyperlipidemia   Essential hypertension   History of  stroke   Coronary artery disease due to lipid rich plaque   Malignant neoplasm of upper-inner quadrant of left breast in female, estrogen receptor negative (HCC)   Uterine prolapse   Vaginal erosion secondary to pessary use (HCC)   Acute urinary retention   Chronic respiratory failure (HCC)   Vaginal bleeding 2* to erosion from pessary use for uterine prolapse -Patient with h/o uterine prolapse with prolonged pessary use -She developed an area of granulation tissue that periodically bleeds - usually with light spotting -Overnight, she developed significant vaginal bleeding that was refractory to ER care -She is being taken to the OR now for further management by GYN Dr. Sabra Heck -Transfused 2 units for worsening anemia with ongoing acute blood loss  Urinary retention -Unable to void if pessary is removed -Has required prolonged foley catheter use in the past because of this issue -Will need removal of the pessary and so likely needs indwelling foley for the foreseeable future -Also with recurrent UTI for which she sees urology; continue qhs Nitrofurantoin prophylaxis  Afib, on Eliquis, with h/o CVA -Rate controlled with Lopressor, Diltiazem - both continued -Will need to hold Eliquis despite high risk -Would suggest resumption once bleeding risk  has stabilized  Breast cancer -Remote h/o R breast cancer -Found to have grade 3 invasive ductal carcinoma of the L breast in May -s/p radiation therapy in June-July -Triple negative so no chemotherapy -Recent breast US with probable retracting hematoma -She is due for repeat mammogram in March  Chronic respiratory failure -Has chronic but stable pulmonary nodules, followed by pulmonology -Has home O2 if needed  HTN -Continue Lopressor, Diltiazem  HLD -Continue Lipitor  CAD -Preserved EF on echo in 04/2019 -Appears mildly volume depleted currently (likely due to bleeding) so would hold Lasix for now and resume when stable  Goals of care -Patient is full code, has a living will -She has h/o CVA, recurrent bleeds (GI, vaginal), afib on AC, breast cancer -Palliative care consultation may be beneficial either as inpatient vs. outpatient     Thank you for this consultation.  Our [facility] hospitalist team will follow the patient with you.   Time Spent: 50 minutes  Karmen Bongo M.D. [facility] 08/08/2020, 8:51 AM

## 2020-08-08 NOTE — Progress Notes (Addendum)
Day of Surgery Procedure(s) (LRB): EXAM UNDER ANESTHESIA WITH TREATMENT OF BLEEDING VAGINAL SIDEWALL LESION (N/A) VAGINAL SIDEWALL BIOPSY (N/A)  Subjective: Patient reports she feels well.  Denies pain.  No SOB or palpitations.  Has catheter in place.  Has been on bed rest today.  Ate dinner without trouble.  Daughter present in room.  ER visit, my earlier care with pt, OR findings, pending pathology, hospitalist consultation, paliative care consultation, possible options for future care if has future bleeding as well as possible treatment for prolapse discussed in depth with pt and her daughter.  All questions answered.  About 45 minutes spent in this discussion.  Objective: I have reviewed patient's vital signs, intake and output, medications and labs.  UOP: about 900cc  General: alert and cooperative Resp: clear to auscultation bilaterally Cardio: irregularly irregular rhythm GI: soft, non-tender; bowel sounds normal; no masses,  no organomegaly Extremities: extremities normal, atraumatic, no cyanosis or edema and SCDs in place Vaginal Bleeding: none, packing is still in place  Assessment: s/p Procedure(s): EXAM UNDER ANESTHESIA WITH TREATMENT OF BLEEDING VAGINAL SIDEWALL LESION (N/A) VAGINAL SIDEWALL BIOPSY (N/A): stable  Anemia from significant vaginal bleeding Afib Hypertension Elevated lipids H/o urinary retention H/o complete uterine prolapse  Plan: 3rd unit of PRBCs transfusing now CBC and BMP in AM Continue to hold eliquis Continue diltiazem and metoprolol Packing is still in place as well as foley SCDs, Protonix for prophylaxis   60 minutes of total time was spent for this patient encounter   LOS: 0 days    Adrienne [name] 08/08/2020, 7:53 PM

## 2020-08-08 NOTE — ED Triage Notes (Signed)
Pt has a vaginal pessaries placed to hold her bladder and has been following up with gynecologist but as of today she has been having bleeding from the area bc it has been rubbing a place. Pt said she has filled up 4 pads now in the past 2 hrs. Pt is on blood thinners. Eliquis. No pain

## 2020-08-08 NOTE — Op Note (Addendum)
08/08/2020  10:15 AM  PATIENT:  Adrienne [name]  86 y.o. female  PRE-OPERATIVE DIAGNOSIS:  Vaginal Bleeding  POST-OPERATIVE DIAGNOSIS:  Vaginal Bleeding of possible vaginal sidewall mass vs bleeding from granulation tissue of cervix  PROCEDURE:  Procedure(s): EXAM UNDER ANESTHESIA WITH TREATMENT OF BLEEDING VAGINAL SIDEWALL LESION VAGINAL SIDEWALL BIOPSY  SURGEON:  Megan Salon  ASSISTANTS: OR staff  ANESTHESIA:   MAC  ESTIMATED BLOOD LOSS: 75 mL  BLOOD ADMINISTERED: 2 unites PRBCs.  First unit was running as we went into the OR from the ER  FLUIDS: 1300cc LR  UOP: 100cc clear UOP  SPECIMEN:  Biopsy of lesion  DISPOSITION OF SPECIMEN:  PATHOLOGY  FINDINGS: firm and very friable lesion that appears to arrise from the left sidewall but due to pt's significant prolapse, this could be her cervix, very brisk bleeding was noted after removal of packing from the ER.  DESCRIPTION OF OPERATION: Patient was taken to the operating room.  She is placed in the supine position. SCDs were on her lower extremities and functioning properly. General anesthesia with MAC anesthesia was administered without difficulty. Dr. Ermalene Postin, anesthesia, oversaw case.  Legs were then placed in the [place] in the low lithotomy position. The legs were lifted to the mid lithotomy position due to pt discomfort when they were placed higher.  Foley from the ER was removed.  Packing from the ER was removed and a large, baseball sized clot passed with it.  Being very gentle, Betadine prep was used on the inner thighs perineum and vagina x3. Patient was draped in a normal standard fashion.   Using a graves speculum, the entire vaginal was visualized.  Due to the significance of her prolapse, this was very difficult as the lesion had significant bleeding after the packing and clot were removed and prep was performed.  Cauterization was used to get significant control of the bleeding. Once bleeding was  under better control, biopsy of the lesion with pick-ups with teeth and curved Mayo scissors was obtained.  This was cauterized as well.  However, there continued to be oozing.  Two figure of eight sutures of #3.0 Vicryl were placed at the base of the lesion.  This also helped with bleeding.  The lesion appeared hemostatic at this point, Surgiflo was placed on the lesion and as I felt we were finishing, the pt coughed several times and very brisk bleeding from the right inferior base of the lesion began.  Two free times with #2.) vicryl were placed around the entire lesion which helped to control the bleeding.  There was firmness to the lesion which is unlike granulation tissue.  Another attempt at identifying the cervix was attempted.  I was unsuccessful with this attempt so it is possible that the lesion is the cervix with significant granulation tissue present or other pathology.  One final figure of eight suture of 3.0 Vircyl at the right inferior base of the lesion obtained excellent hemostasis.  Surgiflo was then placed on and around the lesion.  No active bleeding was noted.  Vagina was then packed with 1/2 in packing covered in Estrace vaginal cream.   At this point no other procedure was needed.  Foley was placed to SD with clear urine noted and a rectal exam was performed.  No stitch was noted.  The prep was cleansed of the patient's skin. The legs are positioned back in the supine position. Sponge, lap, needle, initially counts were correct x2. Patient was taken to  recovery in stable condition.  Procedure took about 90 minutes due to significant amount of bleeding, abnormal appearance of granulation tissue/mass/cervix, as well as blood loss that occurred prior to going to the operating room.  COUNTS:  YES  PLAN OF CARE: Transfer to PACU

## 2020-08-08 NOTE — Anesthesia Preprocedure Evaluation (Addendum)
Anesthesia Evaluation  Patient identified by MRN, date of birth, ID band Patient awake    Reviewed: Allergy & Precautions, NPO status , Patient's Chart, lab work & pertinent test results  History of Anesthesia Complications Negative for: history of anesthetic complications  Airway Mallampati: III  TM Distance: >3 FB Neck ROM: Full    Dental  (+) Teeth Intact, Dental Advisory Given   Pulmonary shortness of breath, former smoker,  Covid-19 Nucleic Acid Test Results Lab Results      Component                Value               Date                      SARSCOV2NAA              NEGATIVE            08/08/2020                [place]              NEGATIVE            01/18/2020              breath sounds clear to auscultation       Cardiovascular hypertension, Pt. on medications and Pt. on home beta blockers + CAD  + dysrhythmias Atrial Fibrillation  Rhythm:Irregular   1. The left ventricle has normal systolic function with an ejection  fraction of 60-65%. The cavity size was normal. Left ventricular diastolic  function could not be evaluated secondary to atrial fibrillation.  2. The right ventricle has normal systolic function. The cavity was  normal. There is no increase in right ventricular wall thickness. Right  ventricular systolic pressure is normal.  3. Left atrial size was severely dilated.  4. There is mild mitral annular calcification present.  5. The aortic valve is tricuspid. Mild sclerosis of the aortic valve.  Aortic valve regurgitation is trivial by color flow Doppler.  6. The aorta is normal unless otherwise noted.  7. The inferior vena cava was dilated in size with >50% respiratory  variability.    Neuro/Psych TIACVA negative psych ROS   GI/Hepatic Neg liver ROS, GERD  Medicated and Controlled,  Endo/Other  negative endocrine ROS  Renal/GU Renal InsufficiencyRenal diseaseLab Results      Component                 Value               Date                      CREATININE               1.10 (H)            08/08/2020                Musculoskeletal negative musculoskeletal ROS (+)   Abdominal   Peds  Hematology  (+) Blood dyscrasia, anemia , Lab Results      Component                Value               Date                      WBC  9.9                 08/08/2020                HGB                      8.5 (L)             08/08/2020                HCT                      25.0 (L)            08/08/2020                MCV                      95.6                08/08/2020                PLT                      245                 08/08/2020           eliquis for afib   Anesthesia Other Findings   Reproductive/Obstetrics                            Anesthesia Physical Anesthesia Plan  ASA: III  Anesthesia Plan: MAC   Post-op Pain Management:    Induction: Intravenous  PONV Risk Score and Plan: 2 and Ondansetron and Propofol infusion  Airway Management Planned: Nasal Cannula  Additional Equipment: None  Intra-op Plan:   Post-operative Plan: Extubation in OR  Informed Consent: I have reviewed the patients History and Physical, chart, labs and discussed the procedure including the risks, benefits and alternatives for the proposed anesthesia with the patient or authorized representative who has indicated his/her understanding and acceptance.     Dental advisory given  Plan Discussed with: CRNA and Surgeon  Anesthesia Plan Comments:        Anesthesia Quick Evaluation

## 2020-08-08 NOTE — Transfer of Care (Signed)
Immediate Anesthesia Transfer of Care Note  Patient: Adrienne [name]  Procedure(s) Performed: EXAM UNDER ANESTHESIA WITH TREATMENT OF BLEEDING VAGINAL SIDEWALL LESION (N/A ) VAGINAL SIDEWALL BIOPSY (N/A )  Patient Location: PACU  Anesthesia Type:MAC  Level of Consciousness: awake, alert , oriented, patient cooperative and responds to stimulation  Airway & Oxygen Therapy: Patient Spontanous Breathing  Post-op Assessment: Report given to RN and Post -op Vital signs reviewed and stable  Post vital signs: Reviewed and stable  Last Vitals:  Vitals Value Taken Time  BP 136/63 08/08/20 1010  Temp 36.4 C 08/08/20 1010  Pulse 88 08/08/20 1012  Resp 27 08/08/20 1012  SpO2 99 % 08/08/20 1012  Vitals shown include unvalidated device data.  Last Pain:  Vitals:   08/08/20 1010  TempSrc:   PainSc: 0-No pain         Complications: No complications documented.

## 2020-08-08 NOTE — ED Provider Notes (Signed)
[facility] EMERGENCY DEPARTMENT Provider Note   CSN: [id number] Arrival date & time: 08/08/20  0017     History Chief Complaint  Patient presents with  . Vaginal Bleeding    Adrienne [name] is a 86 y.o. female.  Patient presents to the emergency department for vaginal bleeding. Patient reports that she has used a pessary for a long time and recently has developed some "erosions" in her vagina secondary to the pessary.  She comes in tonight because she started having large clots passing from her vagina.  Patient does use Eliquis because of a history of permanent atrial fibrillation.        Past Medical History:  Diagnosis Date  . Afib ([place])   . Arthritis of ankle   . CAD (coronary artery disease)   . Cancer [facility]) 2012   breast cancer-right  . Compression fracture of T12 vertebra (HCC)   . Diverticulosis   . Edema   . Elevated uric acid in blood   . GERD (gastroesophageal reflux disease)   . GI bleed 12/2015  . Hyperlipidemia   . Hypertension   . Long-term (current) use of anticoagulants   . Moderate mitral regurgitation   . Personal history of radiation therapy   . Pulmonary HTN ([place])    Echo 1/19: EF 60-65, no RWMA, Ao sclerosis, trivial AI, mild MR, mod BAE, mod TR, PASP 49  . Stroke ([place]) 01/25/2016  . TIA (transient ischemic attack) 09/2011  . Urinary incontinence   . Urinary retention     Patient Active Problem List   Diagnosis Date Noted  . Vaginal bleeding 08/08/2020  . Uterine prolapse 08/08/2020  . Vaginal erosion secondary to pessary use ([place]) 08/08/2020  . Multiple pulmonary nodules 05/24/2020  . Chronic cough 05/24/2020  . Malignant neoplasm of upper-inner quadrant of left breast in female, estrogen receptor negative ([place]) 12/20/2019  . Malignant neoplasm of overlapping sites of right breast in female, estrogen receptor positive ([place]) 08/14/2019  . Osteopenia 08/14/2019  . Diplopia 01/29/2016  . GERD (gastroesophageal  reflux disease) 01/29/2016  . Cerebrovascular accident (CVA) due to embolism of left middle cerebral artery ([place]) 01/29/2016  . Persistent atrial fibrillation ([place])   . Bleeding gastrointestinal   . Gait disturbance, post-stroke 01/27/2016  . Fall   . Secondary hypertension, unspecified   . Cerebral infarction due to thrombosis of left middle cerebral artery (HCC) s/p mechanical thrombectomy   . Malnutrition of moderate degree 01/09/2016  . GI bleed 01/06/2016  . Chronic atrial fibrillation ([place]) 06/10/2015  . Chronic anticoagulation 06/10/2015  . Hyperlipidemia 06/10/2015  . Essential hypertension 06/10/2015  . History of stroke 06/10/2015  . Coronary artery disease due to lipid rich plaque 06/10/2015  . Afib ([place])   . Elevated uric acid in blood   . Arthritis of ankle   . Pulmonary HTN ([place])     Past Surgical History:  Procedure Laterality Date  . APPENDECTOMY    . AXILLARY SENTINEL NODE BIOPSY Left 01/22/2020   Procedure: Left Axillary Sentinel Node Biopsy;  Surgeon: Rolm Bookbinder, MD;  Location: [facility];  Service: General;  Laterality: Left;  . BREAST LUMPECTOMY WITH RADIOACTIVE SEED AND SENTINEL LYMPH NODE BIOPSY Left 01/22/2020   Procedure: LEFT BREAST LUMPECTOMY WITH RADIOACTIVE SEED;  Surgeon: Rolm Bookbinder, MD;  Location: [facility];  Service: General;  Laterality: Left;  . BREAST SURGERY  2012   Rt.mastectomy--[place], [place]  2013  . CATARACT EXTRACTION, BILATERAL    .  DILATION AND CURETTAGE OF UTERUS     in her early 81's for DUB  . ESOPHAGOGASTRODUODENOSCOPY Left 01/08/2016   Procedure: ESOPHAGOGASTRODUODENOSCOPY (EGD);  Surgeon: Arta Silence, MD;  Location: [facility];  Service: Endoscopy;  Laterality: Left;  . GALLBLADDER SURGERY    . MASTECTOMY Right   . RADIOLOGY WITH ANESTHESIA N/A 01/25/2016   Procedure: RADIOLOGY WITH ANESTHESIA;  Surgeon: Luanne Bras, MD;  Location: [facility];  Service: Radiology;  Laterality: N/A;  .  REPLACEMENT TOTAL KNEE BILATERAL    . TONSILLECTOMY AND ADENOIDECTOMY     as a child     OB History    Gravida  3   Para  3   Term  3   Preterm      AB      Living  3     SAB      TAB      Ectopic      Multiple      Live Births              Family History  Problem Relation Age of Onset  . Asthma Mother   . CVA Father   . Atrial fibrillation Sister        HAS PACER  . Pneumonia Brother   . Cancer Daughter        COLON CANCER  . Cancer Maternal Grandmother        breast cancer    Social History   Tobacco Use  . Smoking status: Former Smoker    Packs/day: 0.25    Years: 2.00    Pack years: 0.50    Types: Cigarettes    Quit date: 1950    Years since quitting: 71.9  . Smokeless tobacco: Never Used  Vaping Use  . Vaping Use: Never used  Substance Use Topics  . Alcohol use: No    Alcohol/week: 0.0 standard drinks  . Drug use: No    Home Medications Prior to Admission medications   Medication Sig Start Date End Date Taking? Authorizing Provider  acetaminophen (TYLENOL) 500 MG tablet Take 500-1,000 mg by mouth every 12 (twelve) hours as needed (pain).    Yes [provider]  albuterol (VENTOLIN HFA) 108 (90 Base) MCG/ACT inhaler Inhale 1 puff into the lungs every 6 (six) hours as needed for wheezing or shortness of breath (Cough). 05/13/20  Yes Martyn Ehrich, NP  Ascorbic Acid (VITAMIN C WITH ROSE HIPS) 500 MG tablet Take 500 mg by mouth in the morning and at bedtime.   Yes [provider]  atorvastatin (LIPITOR) 10 MG tablet TAKE 1 TABLET DAILY 06/09/20  Yes Jerline Pain, MD  Calcium Citrate-Vitamin D (SM CALCIUM CITRATE+D3 PETITE PO) Take 1 tablet by mouth in the morning, at noon, and at bedtime.   Yes [provider]  diltiazem (CARDIZEM) 60 MG tablet TAKE 1 TABLET THREE TIMES A DAY 06/09/20  Yes Jerline Pain, MD  ELIQUIS 5 MG TABS tablet TAKE 1 TABLET TWICE A DAY 06/09/20  Yes Jerline Pain, MD  furosemide (LASIX)  20 MG tablet Take 1 tablet (20 mg total) by mouth daily. 05/06/19  Yes Burtis Junes, NP  metoprolol tartrate (LOPRESSOR) 100 MG tablet TAKE 1 TABLET TWICE A DAY 06/09/20  Yes Jerline Pain, MD  Multiple Vitamin (MULTIVITAMIN WITH MINERALS) TABS tablet Take 1 tablet by mouth daily. Centrum for Women 50+   Yes [provider]  nitrofurantoin (MACRODANTIN) 100 MG capsule Take 100 mg by  mouth at bedtime.  06/01/19  Yes [provider]  omeprazole (PRILOSEC) 10 MG capsule Take 10 mg by mouth daily before breakfast.  03/21/19  Yes [provider]  Probiotic Product (UP4 PROBIOTICS WOMENS PO) Take 1 capsule by mouth daily.   Yes [provider]  amoxicillin (AMOXIL) 500 MG capsule Take 2,000 mg by mouth See admin instructions. Take 4 capsules (2000 mg) by mouth 1 hour prior to dental appointments. Patient not taking: Reported on 07/15/2020 06/18/18   [provider]  benzonatate (TESSALON) 100 MG capsule Take 1 capsule (100 mg total) by mouth 2 (two) times daily as needed for cough. Patient not taking: Reported on 07/15/2020 05/13/20   Martyn Ehrich, NP  cefaclor (CECLOR) 250 MG capsule Take 1 capsule (250 mg total) by mouth 3 (three) times daily. 03/30/20   Megan Salon, MD    Allergies    Valium [diazepam], Amiodarone hcl [amiodarone], Compazine [prochlorperazine], Other, Thorazine [chlorpromazine], Zometa [zoledronic acid], and Ciprofloxacin  Review of Systems   Review of Systems  Genitourinary: Positive for vaginal bleeding.  All other systems reviewed and are negative.   Physical Exam Updated Vital Signs BP (!) 107/50 (BP Location: Right Arm)   Pulse 96   Temp 98 F (36.7 C) (Oral)   Resp 16   Ht 5' (1.524 m)   Wt 66.2 kg   LMP 09/12/1973 (Approximate)   SpO2 97%   BMI 28.51 kg/m   Physical Exam Vitals and nursing note reviewed. Exam conducted with a chaperone present.  Constitutional:      General: She is not in acute distress.     Appearance: Normal appearance. She is well-developed.  HENT:     Head: Normocephalic and atraumatic.     Right Ear: Hearing normal.     Left Ear: Hearing normal.     Nose: Nose normal.  Eyes:     Conjunctiva/sclera: Conjunctivae normal.     Pupils: Pupils are equal, round, and reactive to light.  Cardiovascular:     Rate and Rhythm: Regular rhythm.     Heart sounds: S1 normal and S2 normal. No murmur heard.  No friction rub. No gallop.   Pulmonary:     Effort: Pulmonary effort is normal. No respiratory distress.     Breath sounds: Normal breath sounds.  Chest:     Chest wall: No tenderness.  Abdominal:     General: Bowel sounds are normal.     Palpations: Abdomen is soft.     Tenderness: There is no abdominal tenderness. There is no guarding or rebound. Negative signs include Murphy's sign and McBurney's sign.     Hernia: No hernia is present.  Genitourinary:       Comments: Irregular raised mucosal lesion patient's left vaginal fornix region with brisk active pulsatile bleeding Musculoskeletal:        General: Normal range of motion.     Cervical back: Normal range of motion and neck supple.  Skin:    General: Skin is warm and dry.     Findings: No rash.  Neurological:     Mental Status: She is alert and oriented to person, place, and time.     GCS: GCS eye subscore is 4. GCS verbal subscore is 5. GCS motor subscore is 6.     Cranial Nerves: No cranial nerve deficit.     Sensory: No sensory deficit.     Coordination: Coordination normal.  Psychiatric:        Speech:  Speech normal.        Behavior: Behavior normal.        Thought Content: Thought content normal.     ED Results / Procedures / Treatments   Labs (all labs ordered are listed, but only abnormal results are displayed) Labs Reviewed  CBC WITH DIFFERENTIAL/PLATELET - Abnormal; Notable for the following components:      Result Value   RBC 3.64 (*)    Hemoglobin 11.0 (*)    HCT 34.8 (*)    All other  components within normal limits  PROTIME-INR - Abnormal; Notable for the following components:   Prothrombin Time 17.5 (*)    INR 1.5 (*)    All other components within normal limits  COMPREHENSIVE METABOLIC PANEL - Abnormal; Notable for the following components:   Glucose, Bld 166 (*)    Creatinine, Ser 1.17 (*)    Albumin 3.3 (*)    GFR, Estimated 44 (*)    All other components within normal limits  I-STAT CHEM 8, ED - Abnormal; Notable for the following components:   Creatinine, Ser 1.10 (*)    Glucose, Bld 112 (*)    Hemoglobin 8.5 (*)    HCT 25.0 (*)    All other components within normal limits  RESP PANEL BY RT-PCR (FLU A&B, COVID) ARPGX2  FIBRINOGEN  TYPE AND SCREEN  PREPARE RBC (CROSSMATCH)    EKG None  Radiology No results found.  Procedures Procedures (including critical care time)  Medications Ordered in ED Medications  0.9 %  sodium chloride infusion (Manually program via Guardrails IV Fluids) (has no administration in time range)  dextrose 5 %-0.45 % sodium chloride infusion (has no administration in time range)  acetaminophen (TYLENOL) tablet 650 mg (has no administration in time range)  simethicone (MYLICON) chewable tablet 80 mg (has no administration in time range)  ondansetron (ZOFRAN) tablet 4 mg (has no administration in time range)    Or  ondansetron (ZOFRAN) injection 4 mg (has no administration in time range)  menthol-cetylpyridinium (CEPACOL) lozenge 3 mg (has no administration in time range)  diltiazem (CARDIZEM) tablet 60 mg (has no administration in time range)  furosemide (LASIX) tablet 20 mg (has no administration in time range)  metoprolol tartrate (LOPRESSOR) tablet 100 mg (has no administration in time range)  sulfamethoxazole-trimethoprim (BACTRIM) 400-80 MG per tablet 1 tablet (has no administration in time range)  acetaminophen (TYLENOL) tablet 650 mg (650 mg Oral Given 08/08/20 0641)  diphenhydrAMINE (BENADRYL) injection 12.5 mg (12.5  mg Intravenous Given 08/08/20 7672)    ED Course  I have reviewed the triage vital signs and the nursing notes.  Pertinent labs & imaging results that were available during my care of the patient were reviewed by me and considered in my medical decision making (see chart for details).    MDM Rules/Calculators/A&P                          Patient presents to the emergency department for evaluation of vaginal bleeding.  Speculum examination confirms that this is not uterine bleeding, patient has a lesion at 3 o'clock in reference to the cervix that is bleeding.  This is the area that has been previous identified by OB/GYN as an area of irritation from the pessary.  Bleeding is extremely brisk and pulsatile.  Area was injected with lidocaine with epi and bleeding did slow down.  Vagina was then packed with hemostatic gauze.  Patient monitored and for  a while appeared to be hemostatic, then started to have oozing of blood from the vagina again.  Bleeding became more brisk.  Dr. Sabra Heck, OB/GYN was consulted and is evaluating patient in the emergency department and will admit patient to hospital.  CRITICAL CARE Performed by: Orpah Greek   Total critical care time: 30 minutes  Critical care time was exclusive of separately billable procedures and treating other patients.  Critical care was necessary to treat or prevent imminent or life-threatening deterioration.  Critical care was time spent personally by me on the following activities: development of treatment plan with patient and/or surrogate as well as nursing, discussions with consultants, evaluation of patient's response to treatment, examination of patient, obtaining history from patient or surrogate, ordering and performing treatments and interventions, ordering and review of laboratory studies, ordering and review of radiographic studies, pulse oximetry and re-evaluation of patient's condition.  Final Clinical Impression(s) / ED  Diagnoses Final diagnoses:  Vaginal bleeding    Rx / DC Orders ED Discharge Orders    None       German Manke, Gwenyth Allegra, MD 08/08/20 [phone]

## 2020-08-09 ENCOUNTER — Encounter (HOSPITAL_COMMUNITY): Payer: Self-pay | Admitting: Obstetrics & Gynecology

## 2020-08-09 DIAGNOSIS — I272 Pulmonary hypertension, unspecified: Secondary | ICD-10-CM | POA: Diagnosis present

## 2020-08-09 DIAGNOSIS — Z7189 Other specified counseling: Secondary | ICD-10-CM | POA: Diagnosis not present

## 2020-08-09 DIAGNOSIS — R0602 Shortness of breath: Secondary | ICD-10-CM | POA: Diagnosis not present

## 2020-08-09 DIAGNOSIS — R338 Other retention of urine: Secondary | ICD-10-CM | POA: Diagnosis not present

## 2020-08-09 DIAGNOSIS — K219 Gastro-esophageal reflux disease without esophagitis: Secondary | ICD-10-CM | POA: Diagnosis present

## 2020-08-09 DIAGNOSIS — R918 Other nonspecific abnormal finding of lung field: Secondary | ICD-10-CM | POA: Diagnosis present

## 2020-08-09 DIAGNOSIS — N813 Complete uterovaginal prolapse: Secondary | ICD-10-CM | POA: Diagnosis not present

## 2020-08-09 DIAGNOSIS — I2583 Coronary atherosclerosis due to lipid rich plaque: Secondary | ICD-10-CM | POA: Diagnosis present

## 2020-08-09 DIAGNOSIS — M79672 Pain in left foot: Secondary | ICD-10-CM | POA: Diagnosis not present

## 2020-08-09 DIAGNOSIS — E785 Hyperlipidemia, unspecified: Secondary | ICD-10-CM | POA: Diagnosis not present

## 2020-08-09 DIAGNOSIS — Z780 Asymptomatic menopausal state: Secondary | ICD-10-CM | POA: Diagnosis not present

## 2020-08-09 DIAGNOSIS — Z515 Encounter for palliative care: Secondary | ICD-10-CM | POA: Diagnosis not present

## 2020-08-09 DIAGNOSIS — D62 Acute posthemorrhagic anemia: Secondary | ICD-10-CM | POA: Diagnosis not present

## 2020-08-09 DIAGNOSIS — N898 Other specified noninflammatory disorders of vagina: Secondary | ICD-10-CM | POA: Diagnosis not present

## 2020-08-09 DIAGNOSIS — E869 Volume depletion, unspecified: Secondary | ICD-10-CM | POA: Diagnosis not present

## 2020-08-09 DIAGNOSIS — I10 Essential (primary) hypertension: Secondary | ICD-10-CM | POA: Diagnosis present

## 2020-08-09 DIAGNOSIS — Z9981 Dependence on supplemental oxygen: Secondary | ICD-10-CM | POA: Diagnosis not present

## 2020-08-09 DIAGNOSIS — I340 Nonrheumatic mitral (valve) insufficiency: Secondary | ICD-10-CM | POA: Diagnosis present

## 2020-08-09 DIAGNOSIS — A58 Granuloma inguinale: Secondary | ICD-10-CM | POA: Diagnosis not present

## 2020-08-09 DIAGNOSIS — R059 Cough, unspecified: Secondary | ICD-10-CM | POA: Diagnosis not present

## 2020-08-09 DIAGNOSIS — I2510 Atherosclerotic heart disease of native coronary artery without angina pectoris: Secondary | ICD-10-CM | POA: Diagnosis present

## 2020-08-09 DIAGNOSIS — R197 Diarrhea, unspecified: Secondary | ICD-10-CM | POA: Diagnosis not present

## 2020-08-09 DIAGNOSIS — J9610 Chronic respiratory failure, unspecified whether with hypoxia or hypercapnia: Secondary | ICD-10-CM | POA: Diagnosis not present

## 2020-08-09 DIAGNOSIS — T8389XA Other specified complication of genitourinary prosthetic devices, implants and grafts, initial encounter: Secondary | ICD-10-CM | POA: Diagnosis not present

## 2020-08-09 DIAGNOSIS — N938 Other specified abnormal uterine and vaginal bleeding: Secondary | ICD-10-CM | POA: Diagnosis not present

## 2020-08-09 DIAGNOSIS — Z8673 Personal history of transient ischemic attack (TIA), and cerebral infarction without residual deficits: Secondary | ICD-10-CM | POA: Diagnosis not present

## 2020-08-09 DIAGNOSIS — I700 Atherosclerosis of aorta: Secondary | ICD-10-CM | POA: Diagnosis present

## 2020-08-09 DIAGNOSIS — I482 Chronic atrial fibrillation, unspecified: Secondary | ICD-10-CM | POA: Diagnosis not present

## 2020-08-09 DIAGNOSIS — I480 Paroxysmal atrial fibrillation: Secondary | ICD-10-CM | POA: Diagnosis not present

## 2020-08-09 DIAGNOSIS — N939 Abnormal uterine and vaginal bleeding, unspecified: Secondary | ICD-10-CM | POA: Diagnosis not present

## 2020-08-09 DIAGNOSIS — I4821 Permanent atrial fibrillation: Secondary | ICD-10-CM | POA: Diagnosis not present

## 2020-08-09 DIAGNOSIS — N814 Uterovaginal prolapse, unspecified: Secondary | ICD-10-CM | POA: Diagnosis not present

## 2020-08-09 DIAGNOSIS — Y761 Therapeutic (nonsurgical) and rehabilitative obstetric and gynecological devices associated with adverse incidents: Secondary | ICD-10-CM | POA: Diagnosis present

## 2020-08-09 DIAGNOSIS — Z20822 Contact with and (suspected) exposure to covid-19: Secondary | ICD-10-CM | POA: Diagnosis not present

## 2020-08-09 LAB — BASIC METABOLIC PANEL
Anion gap: 6 (ref 5–15)
BUN: 17 mg/dL (ref 8–23)
CO2: 27 mmol/L (ref 22–32)
Calcium: 7.6 mg/dL — ABNORMAL LOW (ref 8.9–10.3)
Chloride: 105 mmol/L (ref 98–111)
Creatinine, Ser: 1.14 mg/dL — ABNORMAL HIGH (ref 0.44–1.00)
GFR, Estimated: 46 mL/min — ABNORMAL LOW (ref >60)
Glucose, Bld: 98 mg/dL (ref 70–99)
Potassium: 4.1 mmol/L (ref 3.5–5.1)
Sodium: 138 mmol/L (ref 135–145)

## 2020-08-09 LAB — CBC
HCT: 25.9 % — ABNORMAL LOW (ref 36.0–46.0)
Hemoglobin: 8.6 g/dL — ABNORMAL LOW (ref 12.0–15.0)
MCH: 29.9 pg (ref 26.0–34.0)
MCHC: 33.2 g/dL (ref 30.0–36.0)
MCV: 89.9 fL (ref 80.0–100.0)
Platelets: 130 10*3/uL — ABNORMAL LOW (ref 150–400)
RBC: 2.88 MIL/uL — ABNORMAL LOW (ref 3.87–5.11)
RDW: 16.5 % — ABNORMAL HIGH (ref 11.5–15.5)
WBC: 9.1 10*3/uL (ref 4.0–10.5)
nRBC: 0 % (ref 0.0–0.2)

## 2020-08-09 LAB — PREPARE RBC (CROSSMATCH)

## 2020-08-09 MED ORDER — DOCUSATE SODIUM 100 MG PO CAPS
100.0000 mg | ORAL_CAPSULE | Freq: Two times a day (BID) | ORAL | Status: DC
  Administered 2020-08-09: 100 mg via ORAL
  Filled 2020-08-09: qty 1

## 2020-08-09 MED ORDER — CHLORHEXIDINE GLUCONATE CLOTH 2 % EX PADS
6.0000 | MEDICATED_PAD | Freq: Every day | CUTANEOUS | Status: DC
  Administered 2020-08-10 – 2020-08-11 (×2): 6 via TOPICAL

## 2020-08-09 MED ORDER — DIPHENHYDRAMINE HCL 50 MG/ML IJ SOLN
12.5000 mg | Freq: Once | INTRAMUSCULAR | Status: AC
  Administered 2020-08-09: 12.5 mg via INTRAVENOUS
  Filled 2020-08-09: qty 1

## 2020-08-09 MED ORDER — ACETAMINOPHEN 325 MG PO TABS
650.0000 mg | ORAL_TABLET | Freq: Once | ORAL | Status: AC
  Administered 2020-08-09: 650 mg via ORAL
  Filled 2020-08-09: qty 2

## 2020-08-09 NOTE — Consult Note (Signed)
Palliative Medicine Inpatient Consult Note  Reason for consult:  Goals of Care "86 year old pt with afib but excellent cognition who is a full code.  Pt, spouse and I have discussed this consultation for them to be fully aware of what a full code would involve.  Pt may desire to stay a full code but also would like additional information.  They do have a living will."  HPI:  Per intake H&P --> Adrienne [name] is an 86 y.o. female with h/o uterine prolapse requiring pessary use, with urinary retention if pessary is not in place; CVA; pulmonary HTN; HTN; HLD; remote breast cancer with recent recurrence; and afib on Eliquis presenting with vaginal bleeding.  The patient reports that the significant bleeding started last night about 9pm.  She has continued to have recurrent heavy bleeding despite attempts at packing in the ER.  She denies CP. She does have chronic SOB and was started on prn home O2 after she started radiation therapy for breast cancer after recurrence in May.  Palliative care was consulted to further discuss code status.   Clinical Assessment/Goals of Care: I have reviewed medical records including EPIC notes, labs and imaging, received report from bedside RN, assessed the patient who was lying in bed in no distress.    I met with Adrienne [name] and her husband Adrienne [name] to further discuss diagnosis prognosis, GOC, EOL wishes, disposition and options.   I introduced Palliative Medicine as specialized medical care for people living with serious illness. It focuses on providing relief from the symptoms and stress of a serious illness. The goal is to improve quality of life for both the patient and the family.  Adrienne [name] shares with me that she is from [place] originally.  She and her husband Adrienne [name] lived in [place] for 57 years before moving to [place], [place] 6 years ago.  They moved to [place] to be closer to her daughter, Adrienne [name].  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  She most enjoys reading the newspaper, watching movies, doing asset page banking projects, and playing the piano/organ.  She is a woman of faith and practices within the [employer] denomination.  Adrienne [name] and her husband Adrienne [name] live in a condo in [place].  She is fully autonomous and able to do all basic activities and instrumental activities of daily living.  She uses a walker only at night to mobilize to the restroom.  A detailed discussion was had today regarding advanced directives patient shares that she does have a living will.  Patient's husband Adrienne [name] plans to bring in a copy of the living will tomorrow.    Concepts specific to code status, artifical feeding and hydration, continued IV antibiotics and rehospitalization was had.  Adrienne [name] and I discussed the reality of a true resuscitation event in someone of her age.  I described to her that often given her comorbid conditions these efforts are not successful.  I shared that we as healthcare providers tend to traumatize patient's bodies in the pursuit of resuscitating them.  I shared with her that often these interventions involve very painful procedures in patients during and afterwards. If they survive they very rarely the people they were before the event.  She and her husband Adrienne [name] understood this information.   I did introduce the MOST form and review each topic so that they had the necessary information to  complete it. I shared that based upon the patients age and co-morbid conditions recovery after such an event would be unlikely. I shared that in her position it would be reasonable to consider DNR code status. We reviewed that being a DNR does not indicate to do nothing.  They said that they would like some time to talk about these topics before completing a MOST form.    The difference between a aggressive medical  intervention path  and a palliative comfort care path for this patient at this time was had. Values and goals of care important to patient and family were attempted to be elicited  Discussed the importance of continued conversation with family and their  medical providers regarding overall plan of care and treatment options, ensuring decisions are within the context of the patients values and GOCs.  Provided  "Hard Choices for [employer]" booklet.   Decision Maker: Patient can make decisions for herself  SUMMARY OF RECOMMENDATIONS   Full code - strongly recommended DNR based upon   MOST form introduced patient and husband to discuss this  TOC - OP Palliative support will be needed to continue ongoing goals of care conversations and for completion of MOST form  Requested a copy of the patients living will to scan into [facility]  Code Status/Advance Care Planning: FULL CODE   Palliative Prophylaxis:   Oral Care, Mobility  Additional Recommendations (Limitations, Scope, Preferences):  Continue present scope  Psycho-social/Spiritual:   Desire for further Chaplaincy support: Yes  Additional Recommendations: Education on code status   Prognosis: Unclear  Discharge Planning: To be determined - PT/OT ordered.   Vitals:   08/08/20 2249 08/09/20 0510  BP: (!) 128/54 (!) 112/46  Pulse: 85 64  Resp: 18 18  Temp: 98.1 F (36.7 C) (!) 97.4 F (36.3 C)  SpO2:  95%    Intake/Output Summary (Last 24 hours) at 08/09/2020 0934 Last data filed at 08/09/2020 9753 Gross per 24 hour  Intake 2201.53 ml  Output 1025 ml  Net 1176.53 ml   Last Weight  Most recent update: 08/08/2020 12:37 AM   Weight  66.2 kg (146 lb)           Gen:  Well nourished elderly F in NAD HEENT: Dry mucous membranes CV: Regular rate and rhythm PULM: clear to auscultation bilaterally ABD: soft/nontender/nondistended/normal bowel sounds EXT: No edema Neuro: Alert and oriented x4  PPS: 70%  This  conversation/these recommendations were discussed with patient primary care team, Dr. Sabra Heck  Time In: 0930 Time Out: 1040 Total Time: 70 Greater than 50%  of this time was spent counseling and coordinating care related to the above assessment and plan.  [facility] Team Team Cell Phone: [phone] Please utilize secure chat with additional questions, if there is no response within 30 minutes please call the above phone number  Palliative Medicine Team providers are available by phone from 7am to 7pm daily and can be reached through the team cell phone.  Should this patient require assistance outside of these hours, please call the patient's attending physician.

## 2020-08-09 NOTE — Care Management Obs Status (Signed)
[facility] NOTIFICATION   Patient Details  Name: Carizma Dunsworth MRN: [id number] Date of Birth: 1927-11-15   Medicare Observation Status Notification Given:  Yes    Norina Buzzard, RN 08/09/2020, 11:40 AM

## 2020-08-09 NOTE — TOC Initial Note (Addendum)
Transition of Care [facility]) - Initial/Assessment Note    Patient Details  Name: Adrienne [name] MRN: [id number] Date of Birth: October 04, 1927  Transition of Care [facility] [place]) CM/SW Contact:    Norina Buzzard, RN Phone Number: 08/09/2020, 12:32 PM  Clinical Narrative:  86 yo F with hx uterine prolapse requiring pessary use, with urinary retention if pessary is not in place; CVA; pulmonary HTN; HTN; HLD; remote breast CA with recent recurrence; and afib on Eliquis presenting with vaginal bleeding.She has continued to have recurrent heavy bleeding despite attempts at packing in the ER.   Received referral to assist with outpt palliative.   Met with pt and husband. She plans to return home with the support of her husband. She doesn't have a preference for a hospice agency. Will contact [facility] for referral and she agreed. Contacted Melissa with [facility] for referral.          Expected Discharge Plan: Home/Self Care Barriers to Discharge: No Barriers Identified   Patient Goals and CMS Choice Patient states their goals for this hospitalization and ongoing recovery are:: to get better      Expected Discharge Plan and Services Expected Discharge Plan: Home/Self Care   Discharge Planning Services: CM Consult   Living arrangements for the past 2 months: [place]: Hospice and [place] Date [facility] Agency Contacted: 08/09/20 Time HH Agency Contacted: [id number] Representative spoke with at [place]: [facility] Arrangements/Services Living arrangements for the past 2 months: [place] with:: Spouse Patient language and need for interpreter reviewed:: Yes Do you feel safe going back to the place where you live?: Yes               Activities of Daily Living      Permission Sought/Granted Permission sought to share information with : Case Manager                 Emotional Assessment Appearance:: Appears stated age, Well-Groomed Attitude/Demeanor/Rapport: Engaged, Gracious, Self-Confident Affect (typically observed): Pleasant, Calm, Appropriate Orientation: : Oriented to Self, Oriented to Place, Oriented to  Time, Oriented to Situation      Admission diagnosis:  Vaginal bleeding [N93.9] Patient Active Problem List   Diagnosis Date Noted  . Palliative care by specialist   . Goals of care, counseling/discussion   . DNR (do not resuscitate) discussion   . Vaginal bleeding 08/08/2020  . Uterine prolapse 08/08/2020  . Vaginal erosion secondary to pessary use ([place]) 08/08/2020  . Acute urinary retention 08/08/2020  . Chronic respiratory failure ([place]) 08/08/2020  . Multiple pulmonary nodules 05/24/2020  . Chronic cough 05/24/2020  . Malignant neoplasm of upper-inner quadrant of left breast in female, estrogen receptor negative ([place]) 12/20/2019  . Malignant neoplasm of overlapping sites of right breast in female, estrogen receptor positive ([place]) 08/14/2019  . Osteopenia 08/14/2019  . Diplopia 01/29/2016  . GERD (gastroesophageal reflux disease) 01/29/2016  . Cerebrovascular accident (CVA) due to embolism of left middle cerebral artery ([place]) 01/29/2016  . Persistent atrial fibrillation ([place])   . Bleeding gastrointestinal   . Gait disturbance, post-stroke 01/27/2016  . Fall   . Secondary hypertension, unspecified   . Cerebral infarction due to thrombosis of left middle cerebral artery (HCC) s/p mechanical thrombectomy   . Malnutrition of moderate degree 01/09/2016  . GI  bleed 01/06/2016  . Chronic atrial fibrillation ([place]) 06/10/2015  . Chronic anticoagulation 06/10/2015  . Hyperlipidemia 06/10/2015  . Essential hypertension 06/10/2015  . History of stroke 06/10/2015  . Coronary artery disease due to lipid rich plaque 06/10/2015  . Elevated uric acid in blood   . Arthritis of ankle   . Pulmonary HTN ([place])    PCP:  Kathyrn Lass,  MD Pharmacy:   [facility], [place] - [address] [place] [address] [place] [address] [place] [address] [place] [address] [place] [address] Phone: [phone] Fax: [phone]  [facility] [place], [place] [address] [place] [address] Phone: [phone] Fax: [phone]     Social Determinants of Health (SDOH) Interventions    Readmission Risk Interventions No flowsheet data found.

## 2020-08-09 NOTE — Progress Notes (Signed)
Hydrologist [facility])  Hospital Liaison: RN note       Notified by [facility] manager of patient/family request for [facility] Palliative services at home after discharge.       Writer spoke with patient to confirm interest and explain services. Patient states she is possibly interested and would like for [place] to follow up.          [facility] Palliative team will follow up with patient after discharge.       Please call with any hospice or palliative related questions.       Thank you for this referral.       Clementeen Hoof, BSN, RN [facility] Liaison (listed on [place] under Hospice and [place] of [place])   [phone]

## 2020-08-09 NOTE — Progress Notes (Signed)
PROGRESS NOTE    Adrienne [name]  KDX:[id number] DOB: 12/26/1927 DOA: 08/08/2020 PCP: Kathyrn Lass, MD   Brief Narrative: 86 year old with past medical history significant for uterine prolapse requiring pessary use, with urinary retention if pessary is not in place, CVA, pulmonary hypertension, hypertension, remote breast cancer with recent recurrence, A. fib on Eliquis presents with vaginal bleeding.  The patient reports that she developed significant bleeding that started the night of admission about 9 PM.  Continue to have heavy bleeding despite packing attempted in the ED. she does have chronic shortness of breath and was a started on as needed home oxygen.     Assessment & Plan:   Principal Problem:   Vaginal bleeding Active Problems:   Chronic atrial fibrillation (HCC)   Hyperlipidemia   Essential hypertension   History of stroke   Coronary artery disease due to lipid rich plaque   Malignant neoplasm of upper-inner quadrant of left breast in female, estrogen receptor negative (HCC)   Uterine prolapse   Vaginal erosion secondary to pessary use (HCC)   Acute urinary retention   Chronic respiratory failure (HCC)   1-Vaginal Bleeding secondary to possible vaginal sidewall Mass Vs Bleeding from Granulation tissue of cervix:  -Received 3 units PRBC.  -Hb drop from 11---to 8.6 -Fourth unit of blood ordered this morning by Dr. Sabra Heck -Packing to be removed tomorrow.  2-Urinary Retention:  Uterine prolapse requiring pessary use, with urinary retention if pessary is not in place. Continue with Foley catheter.  3-A fib, history of CVA;  Continue to hold Eliquis due to vaginal bleeding.  Continue with Lopressor and Diltiazem  4-Breast Cancer; follow with Dr. Jana Hakim  5-Chronic Respiratory Failure;  Home oxygen if needed.   HTN; Continue with lopressor and Diltiazem.  CAD; HLD; continue with beta-blocker.  Goals of care: Goals of care.  Thank you For the  consult will continue to follow along  Estimated body mass index is 28.51 kg/m as calculated from the following:   Height as of this encounter: 5' (1.524 m).   Weight as of this encounter: 66.2 kg.   DVT prophylaxis: SCD Code Status: Full Code Family Communication: Disposition Plan:  Status is: Observation  The patient remains OBS appropriate and will d/c before 2 midnights.  Dispo: The patient is from: Home              Anticipated d/c is to: Home              Anticipated d/c date is: 2 days              Patient currently is not medically stable to d/c.       Subjective: Patient reported no further vaginal bleed, denies abdominal pain.  She had a small bowel movement.  Objective: Vitals:   08/08/20 1955 08/08/20 2035 08/08/20 2249 08/09/20 0510  BP: (!) 143/71 (!) 104/55 (!) 128/54 (!) 112/46  Pulse: (!) 102 89 85 64  Resp: 17 18 18 18   Temp: 97.7 F (36.5 C) 98.7 F (37.1 C) 98.1 F (36.7 C) (!) 97.4 F (36.3 C)  TempSrc: Oral Oral Oral Oral  SpO2: 99%   95%  Weight:      Height:        Intake/Output Summary (Last 24 hours) at 08/09/2020 0718 Last data filed at 08/09/2020 0511 Gross per 24 hour  Intake 2668.6 ml  Output 1025 ml  Net 1643.6 ml   Filed Weights   08/08/20 0037  Weight: 66.2 kg  Examination:  General exam: Appears calm and comfortable  Respiratory system: Clear to auscultation. Respiratory effort normal. Cardiovascular system: S1 & S2 heard, RRR. No JVD, murmurs, rubs, gallops or clicks. No pedal edema. Gastrointestinal system: Abdomen is nondistended, soft and nontender. No organomegaly or masses felt. Normal bowel sounds heard. Central nervous system: Alert and oriented. No focal neurological deficits. Extremities: Symmetric 5 x 5 power.   Data Reviewed: I have personally reviewed following labs and imaging studies  CBC: Recent Labs  Lab 08/08/20 0055 08/08/20 0539 08/08/20 1515 08/09/20 0117  WBC 9.9  --   --  9.1    NEUTROABS 7.4  --   --   --   HGB 11.0* 8.5* 8.9* 8.6*  HCT 34.8* 25.0* 27.0* 25.9*  MCV 95.6  --   --  89.9  PLT 245  --   --  196*   Basic Metabolic Panel: Recent Labs  Lab 08/08/20 0101 08/08/20 0539 08/09/20 0117  NA 138 140 138  K 4.2 4.1 4.1  CL 101 101 105  CO2 26  --  27  GLUCOSE 166* 112* 98  BUN 16 16 17   CREATININE 1.17* 1.10* 1.14*  CALCIUM 9.1  --  7.6*   GFR: Estimated Creatinine Clearance: 27.9 mL/min (A) (by C-G formula based on SCr of 1.14 mg/dL (H)). Liver Function Tests: Recent Labs  Lab 08/08/20 0101  AST 17  ALT 14  ALKPHOS 79  BILITOT 0.7  PROT 6.5  ALBUMIN 3.3*   No results for input(s): LIPASE, AMYLASE in the last 168 hours. No results for input(s): AMMONIA in the last 168 hours. Coagulation Profile: Recent Labs  Lab 08/08/20 0055  INR 1.5*   Cardiac Enzymes: No results for input(s): CKTOTAL, CKMB, CKMBINDEX, TROPONINI in the last 168 hours. BNP (last 3 results) No results for input(s): PROBNP in the last 8760 hours. HbA1C: No results for input(s): HGBA1C in the last 72 hours. CBG: No results for input(s): GLUCAP in the last 168 hours. Lipid Profile: No results for input(s): CHOL, HDL, LDLCALC, TRIG, CHOLHDL, LDLDIRECT in the last 72 hours. Thyroid Function Tests: No results for input(s): TSH, T4TOTAL, FREET4, T3FREE, THYROIDAB in the last 72 hours. Anemia Panel: No results for input(s): VITAMINB12, FOLATE, FERRITIN, TIBC, IRON, RETICCTPCT in the last 72 hours. Sepsis Labs: No results for input(s): PROCALCITON, LATICACIDVEN in the last 168 hours.  Recent Results (from the past 240 hour(s))  Resp Panel by RT-PCR (Flu A&B, Covid) Nasopharyngeal Swab     Status: None   Collection Time: 08/08/20  2:20 AM   Specimen: Nasopharyngeal Swab; Nasopharyngeal(NP) swabs in vial transport medium  Result Value Ref Range Status   SARS Coronavirus 2 by RT PCR NEGATIVE NEGATIVE Final    Comment: (NOTE) SARS-CoV-2 target nucleic acids are NOT  DETECTED.  The SARS-CoV-2 RNA is generally detectable in upper respiratory specimens during the acute phase of infection. The lowest concentration of SARS-CoV-2 viral copies this assay can detect is 138 copies/mL. A negative result does not preclude SARS-Cov-2 infection and should not be used as the sole basis for treatment or other patient management decisions. A negative result may occur with  improper specimen collection/handling, submission of specimen other than nasopharyngeal swab, presence of viral mutation(s) within the areas targeted by this assay, and inadequate number of viral copies(<138 copies/mL). A negative result must be combined with clinical observations, patient history, and epidemiological information. The expected result is Negative.  Fact Sheet for Patients:  EntrepreneurPulse.com.au  Fact Sheet for Healthcare  Providers:  IncredibleEmployment.be  This test is no t yet approved or cleared by the [place] and  has been authorized for detection and/or diagnosis of SARS-CoV-2 by FDA under an Emergency Use Authorization (EUA). This EUA will remain  in effect (meaning this test can be used) for the duration of the COVID-19 declaration under Section 564(b)(1) of the Act, 21 U.S.C.section 360bbb-3(b)(1), unless the authorization is terminated  or revoked sooner.       Influenza A by PCR NEGATIVE NEGATIVE Final   Influenza B by PCR NEGATIVE NEGATIVE Final    Comment: (NOTE) The Xpert Xpress SARS-CoV-2/FLU/RSV plus assay is intended as an aid in the diagnosis of influenza from Nasopharyngeal swab specimens and should not be used as a sole basis for treatment. Nasal washings and aspirates are unacceptable for Xpert Xpress SARS-CoV-2/FLU/RSV testing.  Fact Sheet for Patients: EntrepreneurPulse.com.au  Fact Sheet for Healthcare Providers: IncredibleEmployment.be  This test is not yet  approved or cleared by the [place] FDA and has been authorized for detection and/or diagnosis of SARS-CoV-2 by FDA under an Emergency Use Authorization (EUA). This EUA will remain in effect (meaning this test can be used) for the duration of the COVID-19 declaration under Section 564(b)(1) of the Act, 21 U.S.C. section 360bbb-3(b)(1), unless the authorization is terminated or revoked.  Performed at [facility], [place] [address], [place], [place] [address]          Radiology Studies: No results found.      Scheduled Meds: . atorvastatin  10 mg Oral Daily  . diltiazem  60 mg Oral Q8H  . metoprolol tartrate  100 mg Oral BID  . pantoprazole  40 mg Oral Daily  . sulfamethoxazole-trimethoprim  1 tablet Oral Daily   Continuous Infusions: . dextrose 5 % and 0.45% NaCl 75 mL/hr at 08/08/20 2042  . lactated ringers 10 mL/hr at 08/08/20 0822     LOS: 0 days    Time spent: 35 Minutes.     Elmarie Shiley, MD [facility]   If 7PM-7AM, please contact night-coverage [email or web address]  08/09/2020, 7:18 AM

## 2020-08-09 NOTE — Progress Notes (Addendum)
1 Day Post-Op Procedure(s) (LRB): EXAM UNDER ANESTHESIA WITH TREATMENT OF BLEEDING VAGINAL SIDEWALL LESION (N/A) VAGINAL SIDEWALL BIOPSY (N/A)  Subjective: Patient reports no complaints.  No pain.  Feeling good.  Eating.  Hasn't been out of bed yet.  Passing flatus.  No BM yesterday.   Spouse with pt this morning.  Many of the questions addressed with pt's daughter last night were addressed again this morning--plan of care, removal of packing tomorrow, eliquis, ambulation, palliative care consult.  Objective: I have reviewed patient's vital signs, intake and output, medications and labs. Vitals:   08/08/20 2249 08/09/20 0510  BP: (!) 128/54 (!) 112/46  Pulse: 85 64  Resp: 18 18  Temp: 98.1 F (36.7 C) (!) 97.4 F (36.3 C)  SpO2:  95%   UOP:  ~950cc  General: alert and cooperative Resp: clear to auscultation bilaterally Cardio: irregularly irregular rhythm GI: soft, non-tender; bowel sounds normal; no masses,  no organomegaly Extremities: extremities normal, atraumatic, no cyanosis or edema and SCDs in place Vaginal Bleeding: none and packing is still in place but there is no blood on visible packing or on pad  Assessment: s/p Procedure(s): EXAM UNDER ANESTHESIA WITH TREATMENT OF BLEEDING VAGINAL SIDEWALL LESION (N/A) VAGINAL SIDEWALL BIOPSY (N/A): stable Anemia from significant vaginal bleeding still present.  Transfused 3 units yesterday. Afib Hypertension Elevated lipids H/o urinary retention H/o complete uterine prolapse  Plan: Will transfuse an additional unit of PRBs CBC and BMP in AM Continue to hold eliquis Continue diltiazem and metoprolol Packing is still in place as well as foley SCDs, Protonix for prophylaxis  Add colace Palliative care consult placed  50 minutes spent with pt today with visiting including exam, review of chart, and answering questions.  Megan Salon 08/09/2020, 8:48 AM   Please feel free to call me at [phone] with any  questions/concerns.

## 2020-08-10 ENCOUNTER — Other Ambulatory Visit: Payer: Self-pay | Admitting: Oncology

## 2020-08-10 ENCOUNTER — Inpatient Hospital Stay (HOSPITAL_COMMUNITY): Payer: Medicare Other

## 2020-08-10 ENCOUNTER — Encounter (HOSPITAL_COMMUNITY): Payer: Self-pay | Admitting: Obstetrics & Gynecology

## 2020-08-10 DIAGNOSIS — N939 Abnormal uterine and vaginal bleeding, unspecified: Secondary | ICD-10-CM

## 2020-08-10 DIAGNOSIS — I10 Essential (primary) hypertension: Secondary | ICD-10-CM

## 2020-08-10 DIAGNOSIS — I2510 Atherosclerotic heart disease of native coronary artery without angina pectoris: Secondary | ICD-10-CM

## 2020-08-10 DIAGNOSIS — I482 Chronic atrial fibrillation, unspecified: Secondary | ICD-10-CM

## 2020-08-10 DIAGNOSIS — N814 Uterovaginal prolapse, unspecified: Secondary | ICD-10-CM

## 2020-08-10 DIAGNOSIS — N898 Other specified noninflammatory disorders of vagina: Secondary | ICD-10-CM

## 2020-08-10 DIAGNOSIS — Z8673 Personal history of transient ischemic attack (TIA), and cerebral infarction without residual deficits: Secondary | ICD-10-CM

## 2020-08-10 DIAGNOSIS — I2583 Coronary atherosclerosis due to lipid rich plaque: Secondary | ICD-10-CM

## 2020-08-10 LAB — BPAM RBC
Blood Product Expiration Date: 202112032359
Blood Product Expiration Date: 202112112359
Blood Product Expiration Date: 202112162359
Blood Product Expiration Date: 202112222359
ISSUE DATE / TIME: 202111270637
ISSUE DATE / TIME: 202111270843
ISSUE DATE / TIME: 202111271706
ISSUE DATE / TIME: 202111281112
Unit Type and Rh: 7300
Unit Type and Rh: 7300
Unit Type and Rh: 7300
Unit Type and Rh: 7300

## 2020-08-10 LAB — TYPE AND SCREEN
ABO/RH(D): B POS
Antibody Screen: NEGATIVE
Unit division: 0
Unit division: 0
Unit division: 0
Unit division: 0

## 2020-08-10 LAB — BASIC METABOLIC PANEL
Anion gap: 6 (ref 5–15)
BUN: 14 mg/dL (ref 8–23)
CO2: 26 mmol/L (ref 22–32)
Calcium: 7.9 mg/dL — ABNORMAL LOW (ref 8.9–10.3)
Chloride: 106 mmol/L (ref 98–111)
Creatinine, Ser: 1.18 mg/dL — ABNORMAL HIGH (ref 0.44–1.00)
GFR, Estimated: 44 mL/min — ABNORMAL LOW (ref >60)
Glucose, Bld: 89 mg/dL (ref 70–99)
Potassium: 4 mmol/L (ref 3.5–5.1)
Sodium: 138 mmol/L (ref 135–145)

## 2020-08-10 LAB — CBC
HCT: 30.2 % — ABNORMAL LOW (ref 36.0–46.0)
Hemoglobin: 9.9 g/dL — ABNORMAL LOW (ref 12.0–15.0)
MCH: 29.3 pg (ref 26.0–34.0)
MCHC: 32.8 g/dL (ref 30.0–36.0)
MCV: 89.3 fL (ref 80.0–100.0)
Platelets: 144 10*3/uL — ABNORMAL LOW (ref 150–400)
RBC: 3.38 MIL/uL — ABNORMAL LOW (ref 3.87–5.11)
RDW: 16.5 % — ABNORMAL HIGH (ref 11.5–15.5)
WBC: 10.8 10*3/uL — ABNORMAL HIGH (ref 4.0–10.5)
nRBC: 0 % (ref 0.0–0.2)

## 2020-08-10 MED ORDER — POLYSACCHARIDE IRON COMPLEX 150 MG PO CAPS
150.0000 mg | ORAL_CAPSULE | Freq: Every day | ORAL | Status: DC
  Administered 2020-08-10 – 2020-08-11 (×2): 150 mg via ORAL
  Filled 2020-08-10 (×2): qty 1

## 2020-08-10 NOTE — Progress Notes (Signed)
PROGRESS NOTE    Roshonda Sperl  OHY:[id number] DOB: 05-Jul-1928 DOA: 08/08/2020 PCP: Kathyrn Lass, MD   Brief Narrative: 86 year old with past medical history significant for uterine prolapse requiring pessary use, with urinary retention if pessary is not in place, CVA, pulmonary hypertension, hypertension, remote breast cancer with recent recurrence, A. fib on Eliquis presents with vaginal bleeding.  The patient reports that she developed significant bleeding that started the night of admission about 9 PM.  Continue to have heavy bleeding despite packing attempted in the ED. she does have chronic shortness of breath and was a started on as needed home oxygen.     Assessment & Plan:   Principal Problem:   Vaginal bleeding Active Problems:   Chronic atrial fibrillation (HCC)   Hyperlipidemia   Essential hypertension   History of stroke   Coronary artery disease due to lipid rich plaque   Malignant neoplasm of upper-inner quadrant of left breast in female, estrogen receptor negative (HCC)   Uterine prolapse   Vaginal erosion secondary to pessary use ([place])   Acute urinary retention   Chronic respiratory failure ([place])   Palliative care by specialist   Goals of care, counseling/discussion   DNR (do not resuscitate) discussion   1-Vaginal Bleeding secondary to possible vaginal sidewall Mass Vs Bleeding from Granulation tissue of cervix:  -Received 4 units PRBC.  -Hb drop from 11---to 8.6---9.9 -Packing to be remove today.  -plan to observed overnight after packing was removed.   2-Urinary Retention:  Uterine prolapse requiring pessary use, with urinary retention if pessary is not in place. Continue with Foley catheter.  3-A fib, history of CVA;  Continue to hold Eliquis due to vaginal bleeding. Plan to probably resume tomorrow. Cardiology has been consulted.  Continue with Lopressor and Diltiazem Check EKG.  4-Breast Cancer; follow with Dr. Jana Hakim  5-Chronic  Respiratory Failure;  Home oxygen if needed.  Dyspnea on exertion, chest x ray clear. No pulmonary edema.  Dyspnea probably related to anemia. Started ferrous sulfate.   HTN; Continue with lopressor and Diltiazem.  CAD; HLD; continue with beta-blocker.  Goals of care: Goals of care.  Thank you For the consult will continue to follow along  Estimated body mass index is 28.51 kg/m as calculated from the following:   Height as of this encounter: 5' (1.524 m).   Weight as of this encounter: 66.2 kg.   DVT prophylaxis: SCD Code Status: Full Code Family Communication: Disposition Plan:  Status is: Observation  The patient remains OBS appropriate and will d/c before 2 midnights.  Dispo: The patient is from: Home              Anticipated d/c is to: Home              Anticipated d/c date is: 2 days              Patient currently is not medically stable to d/c.       Subjective: She denies vaginal bleeding today..  She report Dyspnea on exertion.   Objective: Vitals:   08/10/20 0437 08/10/20 0931 08/10/20 1153 08/10/20 1359  BP: (!) 112/49 (!) 114/57  96/61  Pulse: 76 72  74  Resp: 15 17  17   Temp: 98.1 F (36.7 C) 97.8 F (36.6 C)  97.8 F (36.6 C)  TempSrc: Oral   Oral  SpO2: 94% 96% 92% 96%  Weight:      Height:        Intake/Output Summary (Last  24 hours) at 08/10/2020 1404 Last data filed at 08/10/2020 1100 Gross per 24 hour  Intake 840 ml  Output 1950 ml  Net -1110 ml   Filed Weights   08/08/20 0037  Weight: 66.2 kg    Examination:  General exam: NAD Respiratory system: CTA Cardiovascular system: S 1, S 2 RRR Gastrointestinal system: BS present, soft, nt Central nervous system: alert, following command Extremities: no edema   Data Reviewed: I have personally reviewed following labs and imaging studies  CBC: Recent Labs  Lab 08/08/20 0055 08/08/20 0539 08/08/20 1515 08/09/20 0117 08/10/20 0238  WBC 9.9  --   --  9.1 10.8*  NEUTROABS  7.4  --   --   --   --   HGB 11.0* 8.5* 8.9* 8.6* 9.9*  HCT 34.8* 25.0* 27.0* 25.9* 30.2*  MCV 95.6  --   --  89.9 89.3  PLT 245  --   --  130* 161*   Basic Metabolic Panel: Recent Labs  Lab 08/08/20 0101 08/08/20 0539 08/09/20 0117 08/10/20 0238  NA 138 140 138 138  K 4.2 4.1 4.1 4.0  CL 101 101 105 106  CO2 26  --  27 26  GLUCOSE 166* 112* 98 89  BUN 16 16 17 14   CREATININE 1.17* 1.10* 1.14* 1.18*  CALCIUM 9.1  --  7.6* 7.9*   GFR: Estimated Creatinine Clearance: 26.9 mL/min (A) (by C-G formula based on SCr of 1.18 mg/dL (H)). Liver Function Tests: Recent Labs  Lab 08/08/20 0101  AST 17  ALT 14  ALKPHOS 79  BILITOT 0.7  PROT 6.5  ALBUMIN 3.3*   No results for input(s): LIPASE, AMYLASE in the last 168 hours. No results for input(s): AMMONIA in the last 168 hours. Coagulation Profile: Recent Labs  Lab 08/08/20 0055  INR 1.5*   Cardiac Enzymes: No results for input(s): CKTOTAL, CKMB, CKMBINDEX, TROPONINI in the last 168 hours. BNP (last 3 results) No results for input(s): PROBNP in the last 8760 hours. HbA1C: No results for input(s): HGBA1C in the last 72 hours. CBG: No results for input(s): GLUCAP in the last 168 hours. Lipid Profile: No results for input(s): CHOL, HDL, LDLCALC, TRIG, CHOLHDL, LDLDIRECT in the last 72 hours. Thyroid Function Tests: No results for input(s): TSH, T4TOTAL, FREET4, T3FREE, THYROIDAB in the last 72 hours. Anemia Panel: No results for input(s): VITAMINB12, FOLATE, FERRITIN, TIBC, IRON, RETICCTPCT in the last 72 hours. Sepsis Labs: No results for input(s): PROCALCITON, LATICACIDVEN in the last 168 hours.  Recent Results (from the past 240 hour(s))  Resp Panel by RT-PCR (Flu A&B, Covid) Nasopharyngeal Swab     Status: None   Collection Time: 08/08/20  2:20 AM   Specimen: Nasopharyngeal Swab; Nasopharyngeal(NP) swabs in vial transport medium  Result Value Ref Range Status   SARS Coronavirus 2 by RT PCR NEGATIVE NEGATIVE Final      Comment: (NOTE) SARS-CoV-2 target nucleic acids are NOT DETECTED.  The SARS-CoV-2 RNA is generally detectable in upper respiratory specimens during the acute phase of infection. The lowest concentration of SARS-CoV-2 viral copies this assay can detect is 138 copies/mL. A negative result does not preclude SARS-Cov-2 infection and should not be used as the sole basis for treatment or other patient management decisions. A negative result may occur with  improper specimen collection/handling, submission of specimen other than nasopharyngeal swab, presence of viral mutation(s) within the areas targeted by this assay, and inadequate number of viral copies(<138 copies/mL). A negative result must be combined  with clinical observations, patient history, and epidemiological information. The expected result is Negative.  Fact Sheet for Patients:  EntrepreneurPulse.com.au  Fact Sheet for Healthcare Providers:  IncredibleEmployment.be  This test is no t yet approved or cleared by the [place] FDA and  has been authorized for detection and/or diagnosis of SARS-CoV-2 by FDA under an Emergency Use Authorization (EUA). This EUA will remain  in effect (meaning this test can be used) for the duration of the COVID-19 declaration under Section 564(b)(1) of the Act, 21 U.S.C.section 360bbb-3(b)(1), unless the authorization is terminated  or revoked sooner.       Influenza A by PCR NEGATIVE NEGATIVE Final   Influenza B by PCR NEGATIVE NEGATIVE Final    Comment: (NOTE) The Xpert Xpress SARS-CoV-2/FLU/RSV plus assay is intended as an aid in the diagnosis of influenza from Nasopharyngeal swab specimens and should not be used as a sole basis for treatment. Nasal washings and aspirates are unacceptable for Xpert Xpress SARS-CoV-2/FLU/RSV testing.  Fact Sheet for Patients: EntrepreneurPulse.com.au  Fact Sheet for Healthcare  Providers: IncredibleEmployment.be  This test is not yet approved or cleared by the [place] FDA and has been authorized for detection and/or diagnosis of SARS-CoV-2 by FDA under an Emergency Use Authorization (EUA). This EUA will remain in effect (meaning this test can be used) for the duration of the COVID-19 declaration under Section 564(b)(1) of the Act, 21 U.S.C. section 360bbb-3(b)(1), unless the authorization is terminated or revoked.  Performed at [facility], [place] [address] [place], [place] [address]          Radiology Studies: DG Chest 2 View  Result Date: 08/10/2020 CLINICAL DATA:  Shortness of breath. Cough. History of breast carcinoma EXAM: CHEST - 2 VIEW COMPARISON:  Chest CT June 29, 2020; chest radiograph May 09, 2019. FINDINGS: There is apparent scarring in the left base region. There is no edema or consolidation. Heart is upper normal in size with pulmonary vascularity normal. No adenopathy. Postoperative change left axillary region. There is aortic atherosclerosis. Degenerative change noted in each shoulder. IMPRESSION: Scarring left base. No edema or airspace opacity. Heart upper normal in size. No adenopathy by radiography. Aortic Atherosclerosis (ICD10-I70.0). Electronically Signed   By: Lowella Grip III M.D.   On: 08/10/2020 13:54        Scheduled Meds: . atorvastatin  10 mg Oral Daily  . Chlorhexidine Gluconate Cloth  6 each Topical Daily  . diltiazem  60 mg Oral Q8H  . iron polysaccharides  150 mg Oral Daily  . metoprolol tartrate  100 mg Oral BID  . pantoprazole  40 mg Oral Daily  . sulfamethoxazole-trimethoprim  1 tablet Oral Daily   Continuous Infusions: . lactated ringers 10 mL/hr at 08/08/20 0822     LOS: 1 day    Time spent: 35 Minutes.     Elmarie Shiley, MD [facility]   If 7PM-7AM, please contact night-coverage [email or web address]  08/10/2020, 2:04 PM

## 2020-08-10 NOTE — Consult Note (Signed)
Cardiology Consultation:   Patient ID: Adrienne [name] MRN: [id number]; DOB: 1928/01/20  Admit date: 08/08/2020 Date of Consult: 08/10/2020  Primary Care Provider: Kathyrn Lass, MD [facility] Cardiologist: Candee Furbish, MD  [facility] Electrophysiologist:  None    Patient Profile:   Adrienne [name] is a 86 y.o. female with a hx of Permanent AF, pulmonary HTN, HLD, statin intolerance, prior stroke, non obstructive CAD per cath 2013, mod MR and chronic anticoagulation, intolerance to amiodarone, breast cancer with recent recurrence, chronic Lower leg edema and chronic dyspnea who is being seen today for the evaluation of anticoagulation at the request of Dr. Sabra Heck.  History of Present Illness:   Adrienne [name] is followed by Dr. Marlou Porch for the above cardiac issues. She has a history of stroke and afib on anticoagulation. She was previously on coumadin but after the stroke in 2017 she was transitioned to Eliquis. She has failed amiodarone in the past. She is now rate controlled on diltiazem and metoprolol. The patient was last seen 07/14/2020 and was overall stable from a cardiac perspective. Most recent echo 04/2019 showed LVEF 60-65%, severely dilated LA.  The patient presented to the ED 08/07/20 with vaginal bleeding. Bleeding started the night before. Patient started radiation for breast cancer for recurrence recently.  In the ED bleeding continued despite packing. Hgb came back as low as 8.5 and she was transfused. CXR showed chronic changes. Eliquis was held and she was admitted. OBGYN was consulted. The patient underwent treatment 11/27 of bleeding vaginal sidewall lesion and vaginal sidewall biopsy.   Past Medical History:  Diagnosis Date  . Afib ([place])   . Arthritis of ankle   . CAD (coronary artery disease)   . Cancer [facility]) 2012   breast cancer-right  . Compression fracture of T12 vertebra (HCC)   . Diverticulosis   . Edema   . Elevated uric acid in blood    . GERD (gastroesophageal reflux disease)   . GI bleed 12/2015  . Hyperlipidemia   . Hypertension   . Long-term (current) use of anticoagulants   . Moderate mitral regurgitation   . Personal history of radiation therapy   . Pulmonary HTN ([place])    Echo 1/19: EF 60-65, no RWMA, Ao sclerosis, trivial AI, mild MR, mod BAE, mod TR, PASP 49  . Stroke ([place]) 01/25/2016  . TIA (transient ischemic attack) 09/2011  . Urinary incontinence   . Urinary retention     Past Surgical History:  Procedure Laterality Date  . APPENDECTOMY    . AXILLARY SENTINEL NODE BIOPSY Left 01/22/2020   Procedure: Left Axillary Sentinel Node Biopsy;  Surgeon: Rolm Bookbinder, MD;  Location: [place];  Service: General;  Laterality: Left;  . BREAST LUMPECTOMY WITH RADIOACTIVE SEED AND SENTINEL LYMPH NODE BIOPSY Left 01/22/2020   Procedure: LEFT BREAST LUMPECTOMY WITH RADIOACTIVE SEED;  Surgeon: Rolm Bookbinder, MD;  Location: [place];  Service: General;  Laterality: Left;  . BREAST SURGERY  2012   Rt.mastectomy--[place]  2013  . CATARACT EXTRACTION, BILATERAL    . CERVICAL CONIZATION W/BX N/A 08/08/2020   Procedure: VAGINAL SIDEWALL BIOPSY;  Surgeon: Megan Salon, MD;  Location: [place];  Service: Gynecology;  Laterality: N/A;  . DILATION AND CURETTAGE OF UTERUS     in her early 45's for DUB  . ESOPHAGOGASTRODUODENOSCOPY Left 01/08/2016   Procedure: ESOPHAGOGASTRODUODENOSCOPY (EGD);  Surgeon: Arta Silence, MD;  Location: [facility];  Service: Endoscopy;  Laterality: Left;  . GALLBLADDER SURGERY    .  MASTECTOMY Right 2012  . RADIOLOGY WITH ANESTHESIA N/A 01/25/2016   Procedure: RADIOLOGY WITH ANESTHESIA;  Surgeon: Luanne Bras, MD;  Location: [place];  Service: Radiology;  Laterality: N/A;  . REPLACEMENT TOTAL KNEE BILATERAL    . TONSILLECTOMY AND ADENOIDECTOMY     as a child     Home Medications:  Prior to Admission medications   Medication Sig Start Date End Date Taking?  Authorizing Provider  acetaminophen (TYLENOL) 500 MG tablet Take 500-1,000 mg by mouth every 12 (twelve) hours as needed (pain).    Yes [provider]  albuterol (VENTOLIN HFA) 108 (90 Base) MCG/ACT inhaler Inhale 1 puff into the lungs every 6 (six) hours as needed for wheezing or shortness of breath (Cough). 05/13/20  Yes Martyn Ehrich, NP  Ascorbic Acid (VITAMIN C WITH ROSE HIPS) 500 MG tablet Take 500 mg by mouth in the morning and at bedtime.   Yes [provider]  atorvastatin (LIPITOR) 10 MG tablet TAKE 1 TABLET DAILY 06/09/20  Yes Jerline Pain, MD  Calcium Citrate-Vitamin D (SM CALCIUM CITRATE+D3 PETITE PO) Take 1 tablet by mouth in the morning, at noon, and at bedtime.   Yes [provider]  diltiazem (CARDIZEM) 60 MG tablet TAKE 1 TABLET THREE TIMES A DAY 06/09/20  Yes Jerline Pain, MD  ELIQUIS 5 MG TABS tablet TAKE 1 TABLET TWICE A DAY 06/09/20  Yes Jerline Pain, MD  furosemide (LASIX) 20 MG tablet Take 1 tablet (20 mg total) by mouth daily. 05/06/19  Yes Burtis Junes, NP  metoprolol tartrate (LOPRESSOR) 100 MG tablet TAKE 1 TABLET TWICE A DAY 06/09/20  Yes Jerline Pain, MD  Multiple Vitamin (MULTIVITAMIN WITH MINERALS) TABS tablet Take 1 tablet by mouth daily. Centrum for Women 50+   Yes [provider]  nitrofurantoin (MACRODANTIN) 100 MG capsule Take 100 mg by mouth at bedtime.  06/01/19  Yes [provider]  omeprazole (PRILOSEC) 10 MG capsule Take 10 mg by mouth daily before breakfast.  03/21/19  Yes [provider]  Probiotic Product (UP4 PROBIOTICS WOMENS PO) Take 1 capsule by mouth daily.   Yes [provider]    Inpatient Medications: Scheduled Meds: . atorvastatin  10 mg Oral Daily  . Chlorhexidine Gluconate Cloth  6 each Topical Daily  . diltiazem  60 mg Oral Q8H  . iron polysaccharides  150 mg Oral Daily  . metoprolol tartrate  100 mg Oral BID  . pantoprazole  40 mg Oral Daily  .  sulfamethoxazole-trimethoprim  1 tablet Oral Daily   Continuous Infusions: . lactated ringers 10 mL/hr at 08/08/20 0822   PRN Meds: acetaminophen, albuterol, menthol-cetylpyridinium, ondansetron **OR** ondansetron (ZOFRAN) IV, simethicone  Allergies:    Allergies  Allergen Reactions  . Valium [Diazepam] Other (See Comments)    HYPERACTIVITY   . Amiodarone Hcl [Amiodarone] Other (See Comments)    AFIB  . Compazine [Prochlorperazine] Other (See Comments)    HYPERACTIVITY   . Other Other (See Comments)    "Kidney dye"--patient states this gave her severe shakes/chills  . Thorazine [Chlorpromazine] Other (See Comments)    HYPERACTIVITY   . Zometa [Zoledronic Acid] Other (See Comments)    PROBLEMS WITH EYES  TIA  . Ciprofloxacin Swelling    "swelling" in Rt.elbow    Social History:   Social History   Socioeconomic History  . Marital status: Married    Spouse name: Not on file  . Number of children: Not on file  .  Years of education: Not on file  . Highest education level: Not on file  Occupational History  . Not on file  Tobacco Use  . Smoking status: Former Smoker    Packs/day: 0.25    Years: 2.00    Pack years: 0.50    Types: Cigarettes    Quit date: 1950    Years since quitting: 71.9  . Smokeless tobacco: Never Used  Vaping Use  . Vaping Use: Never used  Substance and Sexual Activity  . Alcohol use: No    Alcohol/week: 0.0 standard drinks  . Drug use: No  . Sexual activity: Not Currently    Partners: Male    Birth control/protection: Post-menopausal  Other Topics Concern  . Not on file  Social History Narrative  . Not on file   Social Determinants of Health   Financial Resource Strain:   . Difficulty of Paying Living Expenses: Not on file  Food Insecurity:   . Worried About Charity fundraiser in the Last Year: Not on file  . Ran Out of Food in the Last Year: Not on file  Transportation Needs:   . Lack of Transportation (Medical): Not on file  .  Lack of Transportation (Non-Medical): Not on file  Physical Activity:   . Days of Exercise per Week: Not on file  . Minutes of Exercise per Session: Not on file  Stress:   . Feeling of Stress : Not on file  Social Connections:   . Frequency of Communication with Friends and Family: Not on file  . Frequency of Social Gatherings with Friends and Family: Not on file  . Attends Religious Services: Not on file  . Active Member of Clubs or Organizations: Not on file  . Attends Archivist Meetings: Not on file  . Marital Status: Not on file  Intimate Partner Violence:   . Fear of Current or Ex-Partner: Not on file  . Emotionally Abused: Not on file  . Physically Abused: Not on file  . Sexually Abused: Not on file    Family History:   Family History  Problem Relation Age of Onset  . Asthma Mother   . CVA Father   . Atrial fibrillation Sister        HAS PACER  . Pneumonia Brother   . Cancer Daughter        COLON CANCER  . Cancer Maternal Grandmother        breast cancer     ROS:  Please see the history of present illness.  All other ROS reviewed and negative.     Physical Exam/Data:   Vitals:   08/10/20 0931 08/10/20 1153 08/10/20 1359 08/10/20 1432  BP: (!) 114/57  96/61 (!) 149/67  Pulse: 72  74 69  Resp: 17  17   Temp: 97.8 F (36.6 C)  97.8 F (36.6 C)   TempSrc:   Oral   SpO2: 96% 92% 96% 99%  Weight:      Height:        Intake/Output Summary (Last 24 hours) at 08/10/2020 1503 Last data filed at 08/10/2020 1405 Gross per 24 hour  Intake 840 ml  Output 2450 ml  Net -1610 ml   Last 3 Weights 08/08/2020 07/15/2020 07/14/2020  Weight (lbs) 146 lb 145 lb 6.4 oz 144 lb  Weight (kg) 66.225 kg 65.953 kg 65.318 kg     Body mass index is 28.51 kg/m.  General:  Well nourished, well developed, in no acute distress HEENT:  normal Lymph: no adenopathy Neck: no JVD Endocrine:  No thryomegaly Vascular: No carotid bruits; FA pulses 2+ bilaterally without  bruits  Cardiac:  normal S1, S2; Irreg Irreg; no murmur  Lungs:  clear to auscultation bilaterally, no wheezing, rhonchi or rales  Abd: soft, nontender, no hepatomegaly  Ext: no edema Musculoskeletal:  No deformities, BUE and BLE strength normal and equal Skin: warm and dry  Neuro:  CNs 2-12 intact, no focal abnormalities noted Psych:  Normal affect   EKG:  The EKG was personally reviewed and demonstrates:  pending Telemetry:  Telemetry was personally reviewed and demonstrates:  N/A  Relevant CV Studies:  Echo 04/2019 1. The left ventricle has normal systolic function with an ejection  fraction of 60-65%. The cavity size was normal. Left ventricular diastolic  function could not be evaluated secondary to atrial fibrillation.  2. The right ventricle has normal systolic function. The cavity was  normal. There is no increase in right ventricular wall thickness. Right  ventricular systolic pressure is normal.  3. Left atrial size was severely dilated.  4. There is mild mitral annular calcification present.  5. The aortic valve is tricuspid. Mild sclerosis of the aortic valve.  Aortic valve regurgitation is trivial by color flow Doppler.  6. The aorta is normal unless otherwise noted.  7. The inferior vena cava was dilated in size with >50% respiratory  variability.    Laboratory Data:  High Sensitivity Troponin:  No results for input(s): TROPONINIHS in the last 720 hours.   Chemistry Recent Labs  Lab 08/08/20 0101 08/08/20 0101 08/08/20 0539 08/09/20 0117 08/10/20 0238  NA 138   < > 140 138 138  K 4.2   < > 4.1 4.1 4.0  CL 101   < > 101 105 106  CO2 26  --   --  27 26  GLUCOSE 166*   < > 112* 98 89  BUN 16   < > 16 17 14   CREATININE 1.17*   < > 1.10* 1.14* 1.18*  CALCIUM 9.1  --   --  7.6* 7.9*  GFRNONAA 44*  --   --  46* 44*  ANIONGAP 11  --   --  6 6   < > = values in this interval not displayed.    Recent Labs  Lab 08/08/20 0101  PROT 6.5  ALBUMIN 3.3*    AST 17  ALT 14  ALKPHOS 79  BILITOT 0.7   Hematology Recent Labs  Lab 08/08/20 0055 08/08/20 0539 08/08/20 1515 08/09/20 0117 08/10/20 0238  WBC 9.9  --   --  9.1 10.8*  RBC 3.64*  --   --  2.88* 3.38*  HGB 11.0*   < > 8.9* 8.6* 9.9*  HCT 34.8*   < > 27.0* 25.9* 30.2*  MCV 95.6  --   --  89.9 89.3  MCH 30.2  --   --  29.9 29.3  MCHC 31.6  --   --  33.2 32.8  RDW 14.0  --   --  16.5* 16.5*  PLT 245  --   --  130* 144*   < > = values in this interval not displayed.   BNPNo results for input(s): BNP, PROBNP in the last 168 hours.  DDimer No results for input(s): DDIMER in the last 168 hours.   Radiology/Studies:  DG Chest 2 View  Result Date: 08/10/2020 CLINICAL DATA:  Shortness of breath. Cough. History of breast carcinoma EXAM: CHEST - 2 VIEW COMPARISON:  Chest CT June 29, 2020; chest radiograph May 09, 2019. FINDINGS: There is apparent scarring in the left base region. There is no edema or consolidation. Heart is upper normal in size with pulmonary vascularity normal. No adenopathy. Postoperative change left axillary region. There is aortic atherosclerosis. Degenerative change noted in each shoulder. IMPRESSION: Scarring left base. No edema or airspace opacity. Heart upper normal in size. No adenopathy by radiography. Aortic Atherosclerosis (ICD10-I70.0). Electronically Signed   By: Lowella Grip III M.D.   On: 08/10/2020 13:54     Assessment and Plan:   Permanent Afib - Pt presented with vaginal bleeding and Hgb down to 8.5 s/p 4 units PRBCs. Eliquis was held. Last dose was 11/26 at 7 PM - CHADSVASC at least 6 (female, agex2, stroke x 2, CAD) and has a history of stroke on subtherapeutic INR when she was on coumadin This patients CHA2DS2-VASc Score and unadjusted Ischemic Stroke Rate (% per year) is equal to 9.7 % stroke rate/year from a score of 6  Above score calculated as 1 point each if present [CHF, HTN, DM, Vascular=MI/PAD/Aortic Plaque, Age if 65-74, or  Female] Above score calculated as 2 points each if present [Age > 75, or Stroke/TIA/TE] - Hgb 9.9 today. Plan to restart tomorrow per IM note. Suspect with history of stroke patient should restart anticoagulation ASAP when cleared by OBGYN. Will discuss with MD.    Acute anemia 2/2 vaginal bleeding - due to possible vaginal side wall mass vs bleeding from granulation tissue of cervix s/p repair 08/08/20  - s/p 4 units PRBCs - packing removed, no recurrent bleeding  Breast cancer - recently started radiation - follows with oncology  HLD - history of statin intolerance  Chronic dyspnea - multifactorial, now suspected from anemia - possible history of Pulmonary HTN - CXR clear - O2 as needed  Chronic lower leg edema - takes lasix every other day.  - stable on exam   For questions or updates, please contact [place] Please consult [email or web address] for contact info under    Signed, Tavyn Kurka Ninfa Meeker, PA-C  08/10/2020 3:03 PM

## 2020-08-10 NOTE — Evaluation (Signed)
Occupational Therapy Evaluation Patient Details Name: Adrienne [name] MRN: [id number] DOB: Apr 11, 1928 Today's Date: 08/10/2020    History of Present Illness Adrienne [name] is an 86 y.o. female with h/o uterine prolapse requiring pessary use, with urinary retention if pessary is not in place; CVA; pulmonary HTN; HTN; HLD; remote breast cancer with recent recurrence; and afib on Eliquis presenting with vaginal bleeding.   Clinical Impression   PTA, pt lives with husband and reports Independence in ADLs, IADLs and mobility. Pt reports using RW at night for mobility to/from bathroom only. Pt presents with mild deficits in strength and endurance with Setup assist required for UB ADLs and Supervision for LB ADLs. Pt overall Supervision for bed mobility and mobility to/from bathroom without AD. Pt did endorse SOB and fatigue during ADLs standing at sink this AM with SpO2 at 90% on RA. Began education on energy conservation strategies with pt SpO2 increasing to 93-94% with seated rest break. Anticipate no OT needs at DC, but pt would benefit from skilled OT services at acute level to improve endurance and maximize strength during daily tasks.     Follow Up Recommendations  No OT follow up    Equipment Recommendations  None recommended by OT    Recommendations for Other Services       Precautions / Restrictions Precautions Precautions: Fall Precaution Comments: monitor O2 Restrictions Weight Bearing Restrictions: No      Mobility Bed Mobility Overal bed mobility: Needs Assistance Bed Mobility: Supine to Sit     Supine to sit: Supervision;HOB elevated     General bed mobility comments: No assist needed    Transfers Overall transfer level: Needs assistance Equipment used: None Transfers: Sit to/from Stand Sit to Stand: Supervision         General transfer comment: Supervision for safety, no LOB and no use of AD    Balance Overall balance assessment: Needs  assistance Sitting-balance support: No upper extremity supported;Feet supported Sitting balance-Leahy Scale: Good     Standing balance support: No upper extremity supported;During functional activity Standing balance-Leahy Scale: Good                             ADL either performed or assessed with clinical judgement   ADL Overall ADL's : Needs assistance/impaired Eating/Feeding: Independent;Sitting   Grooming: Set up;Standing;Oral care;Wash/dry hands;Brushing hair Grooming Details (indicate cue type and reason): Setup overall for ADLs standing at sink. Noted with some SOB. Educated on performing these tasks seated at home as needed Upper Body Bathing: Set up;Sitting   Lower Body Bathing: Set up;Sit to/from stand   Upper Body Dressing : Set up;Sitting   Lower Body Dressing: Set up;Sit to/from stand   Toilet Transfer: Supervision/safety;Ambulation;Regular Glass blower/designer Details (indicate cue type and reason): simulated in room, no use of AD. Mild instability but no overt LOB Toileting- Clothing Manipulation and Hygiene: Supervision/safety;Sit to/from stand       Functional mobility during ADLs: Supervision/safety General ADL Comments: Pt with mild deficits in endurance and strength impacting ability to complete ADLs efficiently      Vision Baseline Vision/History: Wears glasses Wears Glasses: Reading only Patient Visual Report: No change from baseline Vision Assessment?: No apparent visual deficits     Perception     Praxis      Pertinent Vitals/Pain Pain Assessment: No/denies pain     Hand Dominance Right   Extremity/Trunk Assessment Upper Extremity Assessment Upper Extremity Assessment: Generalized weakness  Lower Extremity Assessment Lower Extremity Assessment: Defer to PT evaluation   Cervical / Trunk Assessment Cervical / Trunk Assessment: Normal   Communication Communication Communication: No difficulties   Cognition  Arousal/Alertness: Awake/alert Behavior During Therapy: WFL for tasks assessed/performed Overall Cognitive Status: Within Functional Limits for tasks assessed                                     General Comments  Pt on RA, SpO2 90% and HR 110bpm after activity. Began education on energy conservation strategies while promoting improvements in endurance     Exercises     Shoulder Instructions      Home Living Family/patient expects to be discharged to:: Private residence Living Arrangements: Spouse/significant other Available Help at Discharge: Family;Available 24 hours/day Type of Home: Other(Comment) (condo) Home Access: Stairs to enter Entrance Stairs-Number of Steps: 1 step from garage, 1 step from patio and 2 through front door (uses garage entrance) Entrance Stairs-Rails: None Home Layout: One level     Bathroom Shower/Tub: Occupational psychologist: Handicapped height     Home Equipment: Shower seat - built in;Grab bars - tub/shower;Hand held Tourist information centre manager - 2 wheels          Prior Functioning/Environment Level of Independence: Independent;Independent with assistive device(s)        Comments: Independent in all ADLs, IADLs. Sits for showering tasks. Husband drives. Pt occasionally uses walker at night for mobility to/from bathroom        OT Problem List: Decreased strength;Decreased activity tolerance;Cardiopulmonary status limiting activity      OT Treatment/Interventions: Self-care/ADL training;Therapeutic exercise;Energy conservation;DME and/or AE instruction;Therapeutic activities;Patient/family education    OT Goals(Current goals can be found in the care plan section) Acute Rehab OT Goals Patient Stated Goal: ensure bleeding has stopped, be able to go home OT Goal Formulation: With patient Time For Goal Achievement: 08/24/20 Potential to Achieve Goals: Good ADL Goals Pt Will Transfer to Toilet: with modified  independence;ambulating;regular height toilet Pt/caregiver will Perform Home Exercise Program: Increased strength;Both right and left upper extremity;With theraband;Independently;With written HEP provided Additional ADL Goal #1: Pt to demonstrate ability to complete ADLs > 5-7 minutes without rest break to improve endurance Additional ADL Goal #2: Pt to verbalize at least 3 energy conservation strategies to implement during ADLs/IADLs to maximize efficiency  OT Frequency: Min 2X/week   Barriers to D/C:            Co-evaluation              AM-PAC OT "6 Clicks" Daily Activity     Outcome Measure Help from another person eating meals?: None Help from another person taking care of personal grooming?: A Little Help from another person toileting, which includes using toliet, bedpan, or urinal?: A Little Help from another person bathing (including washing, rinsing, drying)?: A Little Help from another person to put on and taking off regular upper body clothing?: A Little Help from another person to put on and taking off regular lower body clothing?: A Little 6 Click Score: 19   End of Session Nurse Communication: Mobility status;Other (comment) (O2)  Activity Tolerance: Patient tolerated treatment well Patient left: in chair;with call bell/phone within reach  OT Visit Diagnosis: Muscle weakness (generalized) (M62.81)                Time: 5093-2671 OT Time Calculation (min): 24 min Charges:  OT General  Charges $OT Visit: 1 Visit OT Evaluation $OT Eval Low Complexity: 1 Low OT Treatments $Self Care/Home Management : 8-22 mins  Layla Maw, OTR/L  Layla Maw 08/10/2020, 8:09 AM

## 2020-08-10 NOTE — Evaluation (Signed)
Physical Therapy Evaluation Patient Details Name: Adrienne [name] MRN: [id number] DOB: 17-Oct-1927 Today's Date: 08/10/2020   History of Present Illness  Adrienne [name] is an 86 y.o. female with h/o uterine prolapse requiring pessary use, with urinary retention if pessary is not in place; CVA; pulmonary HTN; HTN; HLD; remote breast cancer with recent recurrence; and afib on Eliquis presenting with vaginal bleeding.  Clinical Impression  Patient received in bed, she is agreeable to PT assessment. Patient is mod independent with bed mobility. Fatigued and sob with activity requiring increased time and rest breaks. She performed sit to stand with supervision. Ambulated 100 feet with min guard. She is slightly unsteady with ambulation. SOB after walking and O2 saturations down to 91%. She will continue to benefit from skilled PT to improve functional independence and activity tolerance.       Follow Up Recommendations Home health PT    Equipment Recommendations  None recommended by PT    Recommendations for Other Services       Precautions / Restrictions Precautions Precautions: Fall Precaution Comments: mod fall, sob with mobility Restrictions Weight Bearing Restrictions: No      Mobility  Bed Mobility Overal bed mobility: Modified Independent Bed Mobility: Supine to Sit;Sit to Supine     Supine to sit: Modified independent (Device/Increase time);HOB elevated Sit to supine: Modified independent (Device/Increase time);HOB elevated   General bed mobility comments: No assist needed    Transfers Overall transfer level: Needs assistance   Transfers: Sit to/from Stand Sit to Stand: Supervision         General transfer comment: Supervision for safety, no LOB and no use of AD  Ambulation/Gait Ambulation/Gait assistance: Supervision Gait Distance (Feet): 100 Feet Assistive device: 1 person hand held assist Gait Pattern/deviations: Step-through  pattern;Decreased stride length;Decreased step length - right;Decreased step length - left Gait velocity: decr   General Gait Details: patient with decreased balance during dynamic mobility, sob with activity. O2 saturations down to 91% after walking on room air.  Stairs            Wheelchair Mobility    Modified Rankin (Stroke Patients Only)       Balance Overall balance assessment: Needs assistance Sitting-balance support: Feet supported Sitting balance-Leahy Scale: Good     Standing balance support: Single extremity supported;During functional activity Standing balance-Leahy Scale: Fair Standing balance comment: good static standing, fair dyanamic balance                             Pertinent Vitals/Pain Pain Assessment: No/denies pain    Home Living Family/patient expects to be discharged to:: Private residence Living Arrangements: Spouse/significant other Available Help at Discharge: Family;Available 24 hours/day Type of Home: Other(Comment) (condo) Home Access: Stairs to enter Entrance Stairs-Rails: None Entrance Stairs-Number of Steps: 1 step from garage, 1 step from patio and 2 through front door Home Layout: One level Home Equipment: Shower seat - built in;Grab bars - tub/shower;Hand held Tourist information centre manager - 2 wheels      Prior Function Level of Independence: Independent         Comments: Independent in all ADLs, IADLs. Sits for showering tasks. Husband drives. Pt occasionally uses walker at night for mobility to/from bathroom     Hand Dominance        Extremity/Trunk Assessment   Upper Extremity Assessment Upper Extremity Assessment: Defer to OT evaluation    Lower Extremity Assessment Lower Extremity Assessment: Generalized weakness  Cervical / Trunk Assessment Cervical / Trunk Assessment: Normal  Communication   Communication: No difficulties  Cognition Arousal/Alertness: Awake/alert Behavior During Therapy: WFL for  tasks assessed/performed Overall Cognitive Status: Within Functional Limits for tasks assessed                                        General Comments      Exercises     Assessment/Plan    PT Assessment Patient needs continued PT services  PT Problem List Decreased strength;Decreased mobility;Decreased activity tolerance;Decreased balance;Cardiopulmonary status limiting activity       PT Treatment Interventions DME instruction;Therapeutic activities;Gait training;Therapeutic exercise;Patient/family education;Functional mobility training;Stair training    PT Goals (Current goals can be found in the Care Plan section)  Acute Rehab PT Goals Patient Stated Goal: ensure bleeding has stopped, be able to go home PT Goal Formulation: With patient Time For Goal Achievement: 08/17/20 Potential to Achieve Goals: Good    Frequency Min 3X/week   Barriers to discharge        Co-evaluation               AM-PAC PT "6 Clicks" Mobility  Outcome Measure Help needed turning from your back to your side while in a flat bed without using bedrails?: None Help needed moving from lying on your back to sitting on the side of a flat bed without using bedrails?: None Help needed moving to and from a bed to a chair (including a wheelchair)?: A Little Help needed standing up from a chair using your arms (e.g., wheelchair or bedside chair)?: A Little Help needed to walk in hospital room?: A Little Help needed climbing 3-5 steps with a railing? : A Little 6 Click Score: 20    End of Session   Activity Tolerance: Patient tolerated treatment well;Patient limited by fatigue;Other (comment) (sob with activity) Patient left: in bed;with call bell/phone within reach;with family/visitor present Nurse Communication: Mobility status PT Visit Diagnosis: Unsteadiness on feet (R26.81);Muscle weakness (generalized) (M62.81);Difficulty in walking, not elsewhere classified (R26.2)    Time:  1130-1144 PT Time Calculation (min) (ACUTE ONLY): 14 min   Charges:   PT Evaluation $PT Eval Moderate Complexity: 1 Mod PT Treatments $Gait Training: 8-22 mins        Arelia Volpe, PT, GCS 08/10/20,12:03 PM

## 2020-08-10 NOTE — Progress Notes (Signed)
[name]  [phone] [phone]     ID: Adrienne [name] DOB: Feb 19, 1928  MR#: [id number]  MPN#:[id number]  Patient Care Team: Kathyrn Lass, MD as PCP - General (Family Medicine) Jerline Pain, MD as PCP - Cardiology (Cardiology) Yisroel Ramming, Everardo All, MD as Consulting Physician (Obstetrics and Gynecology) Abel Hageman, Virgie Dad, MD as Consulting Physician (Oncology) Jerline Pain, MD as Consulting Physician (Cardiology) Sydnee Levans, MD as Referring Physician (Dermatology) Garner Nash, DO as Consulting Physician (Pulmonary Disease) Rolm Bookbinder, MD as Consulting Physician (General Surgery) Gery Pray, MD as Consulting Physician (Radiation Oncology) Chauncey Cruel, MD OTHER MD:  CHIEF COMPLAINT: Bilateral breast cancer,  estrogen receptor positive (s/p right mastectomy)  CURRENT TREATMENT: Observation   INTERVAL HISTORY: [name] was scheduled to see me today but it turns out she is admitted at [facility].  We were not apprised of this.  Since her last visit, she underwent chest CT on 06/29/2020 for follow up of a lung nodule. This showed: previous nodules in posterior right upper lobe and in lateral right middle lobe no longer identified; unchanged nodules in right lung base and middle lobe; no new nodules seen; 7.1 cm collection in left breast; radiation changes to left breast and anterior left lung.  She proceeded to left diagnostic mammography with tomography and left breast ultrasonography at [facility] on 07/31/2020 to further evaluate the collection. This showed: breast density category C; complex fluid collection at left breast lumpectomy site favored to represent a retracting hematoma; no evidence of malignancy or infection.  She was referred to the ED on 08/08/2020 by Dr. Sabra Heck with complaints of vaginal bleeding. She was found to have a bleeding vaginal sidewall lesion. Biopsy was performed the  day, and pathology i shows only granulation tissue   REVIEW OF SYSTEMS: [place]    COVID 19 VACCINATION STATUS:    RIGHT BREAST CANCER HISTORY: From the original intake note:  [name] has a right breast mass noted by her primary care doctor in [place] in 2012.  She was referred to surgery and underwent right mastectomy and axillary lymph node dissection or sampling (we do not have the pathology report) 06/04/2011 for what appears to have been a multicentric invasive ductal carcinoma, grade 2.  There were three separate tumor foci, spanning a total of 3.8 cm. Prognostic indicators were: ER positive, PR positive, and Her2 negative. One out of 3 biopsied lymph nodes was positive for micrometastasis.   LEFT BREAST CANCER HISTORY: Adrienne [name] had routine left screening mammography on 11/19/2019 showing a possible abnormality. She underwent left diagnostic mammography with tomography and left breast ultrasonography at [facility] on 12/05/2019 showing: breast density category C; 0.7 cm left breast mass at 11 o'clock; development of three left breast cysts compared to prior imaging from [place] in 11/2017; no left axillary adenopathy.  Accordingly on 12/10/2019 she proceeded to biopsy of the left breast area in question. The pathology from this procedure ([id number]) showed: invasive ductal carcinoma, grade 3; ductal carcinoma in situ. Prognostic indicators significant for: estrogen receptor, 0% negative and progesterone receptor, 0% negative. Proliferation marker Ki67 at 90%. HER2 negative by immunohistochemistry (0).  The patient's subsequent history is as detailed below.   PAST MEDICAL HISTORY: Past Medical History:  Diagnosis Date  . Afib ([place])   . Arthritis of ankle   . CAD (coronary artery disease)   . Cancer [facility]) 2012   breast cancer-right  . Compression fracture of T12  vertebra ([place])   . Diverticulosis   . Edema   . Elevated uric acid in blood   . GERD  (gastroesophageal reflux disease)   . GI bleed 12/2015  . Hyperlipidemia   . Hypertension   . Long-term (current) use of anticoagulants   . Moderate mitral regurgitation   . Personal history of radiation therapy   . Pulmonary HTN ([place])    Echo 1/19: EF 60-65, no RWMA, Ao sclerosis, trivial AI, mild MR, mod BAE, mod TR, PASP 49  . Stroke ([place]) 01/25/2016  . TIA (transient ischemic attack) 09/2011  . Urinary incontinence   . Urinary retention     PAST SURGICAL HISTORY: Past Surgical History:  Procedure Laterality Date  . APPENDECTOMY    . AXILLARY SENTINEL NODE BIOPSY Left 01/22/2020   Procedure: Left Axillary Sentinel Node Biopsy;  Surgeon: Rolm Bookbinder, MD;  Location: [place];  Service: General;  Laterality: Left;  . BREAST LUMPECTOMY WITH RADIOACTIVE SEED AND SENTINEL LYMPH NODE BIOPSY Left 01/22/2020   Procedure: LEFT BREAST LUMPECTOMY WITH RADIOACTIVE SEED;  Surgeon: Rolm Bookbinder, MD;  Location: [place];  Service: General;  Laterality: Left;  . BREAST SURGERY  2012   Rt.mastectomy--[place], [place]  2013  . CATARACT EXTRACTION, BILATERAL    . CERVICAL CONIZATION W/BX N/A 08/08/2020   Procedure: VAGINAL SIDEWALL BIOPSY;  Surgeon: Megan Salon, MD;  Location: [place];  Service: Gynecology;  Laterality: N/A;  . DILATION AND CURETTAGE OF UTERUS     in her early 20's for DUB  . ESOPHAGOGASTRODUODENOSCOPY Left 01/08/2016   Procedure: ESOPHAGOGASTRODUODENOSCOPY (EGD);  Surgeon: Arta Silence, MD;  Location: [facility];  Service: Endoscopy;  Laterality: Left;  . GALLBLADDER SURGERY    . MASTECTOMY Right 2012  . RADIOLOGY WITH ANESTHESIA N/A 01/25/2016   Procedure: RADIOLOGY WITH ANESTHESIA;  Surgeon: Luanne Bras, MD;  Location: [place];  Service: Radiology;  Laterality: N/A;  . REPLACEMENT TOTAL KNEE BILATERAL    . TONSILLECTOMY AND ADENOIDECTOMY     as a child    FAMILY HISTORY: Family History  Problem Relation Age of Onset  . Asthma Mother     . CVA Father   . Atrial fibrillation Sister        HAS PACER  . Pneumonia Brother   . Cancer Daughter        COLON CANCER  . Cancer Maternal Grandmother        breast cancer   Patient's father was 48 years old when he died from stroke. Patient's mother died from coronary artery disease at age 38. The patient denies a family hx of ovarian cancer. She reports breast cancer in her maternal grandmother. She has 2 siblings, 1 brother and 1 sister. Her brother died from stroke/AIDS. She also reports pancreatic cancer in her paternal grandmother and colon cancer in her daughter.   GYNECOLOGIC HISTORY:  Patient's last menstrual period was 09/12/1973 (approximate). Menarche: 86 years old Age at first live birth: 86 years old [place] P 3 LMP late 67s Contraceptive never HRT yes, remote , less than 6 months Hysterectomy?  No BSO?  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  Her husband [name] is a Marketing executive, (PhD, worked at [employer]).  Their children are Lockie Pares who lives in [place] and whose husband is a English as a second language teacher; Blair [employer]") who lives in [place] and  works in Publishing rights manager; and Dixon, who lives in [place] and works for [employer].  The patient has 11 grandchildren and 8 great-grandchildren.  She attends [employer] locally    ADVANCED DIRECTIVES: In the absence of documents to the contrary the patient's husband is her healthcare power of attorney   HEALTH MAINTENANCE: Social History   Tobacco Use  . Smoking status: Former Smoker    Packs/day: 0.25    Years: 2.00    Pack years: 0.50    Types: Cigarettes    Quit date: 1950    Years since quitting: 71.9  . Smokeless tobacco: Never Used  Vaping Use  . Vaping Use: Never used  Substance Use Topics  . Alcohol use: No    Alcohol/week: 0.0 standard drinks  . Drug use: No     Colonoscopy: Remote  PAP: 11/2015,  negative  Bone density: Osteopenia   Allergies  Allergen Reactions  . Valium [Diazepam] Other (See Comments)    HYPERACTIVITY   . Amiodarone Hcl [Amiodarone] Other (See Comments)    AFIB  . Compazine [Prochlorperazine] Other (See Comments)    HYPERACTIVITY   . Other Other (See Comments)    "Kidney dye"--patient states this gave her severe shakes/chills  . Thorazine [Chlorpromazine] Other (See Comments)    HYPERACTIVITY   . Zometa [Zoledronic Acid] Other (See Comments)    PROBLEMS WITH EYES  TIA  . Ciprofloxacin Swelling    "swelling" in Rt.elbow    No current facility-administered medications for this visit.   Current Outpatient Medications  Medication Sig Dispense Refill  . [START ON 08/12/2020] iron polysaccharides (NIFEREX) 150 MG capsule Take 1 capsule (150 mg total) by mouth daily. 30 capsule 0   Facility-Administered Medications Ordered in Other Visits  Medication Dose Route Frequency Provider Last Rate Last Admin  . acetaminophen (TYLENOL) tablet 650 mg  650 mg Oral Q4H PRN Megan Salon, MD   650 mg at 08/11/20 1002  . albuterol (VENTOLIN HFA) 108 (90 Base) MCG/ACT inhaler 1 puff  1 puff Inhalation Q6H PRN Karmen Bongo, MD      . apixaban Arne Cleveland) tablet 5 mg  5 mg Oral BID Nunzio Cobbs, MD   5 mg at 08/11/20 0949  . atorvastatin (LIPITOR) tablet 10 mg  10 mg Oral Daily Karmen Bongo, MD   10 mg at 08/11/20 0949  . Chlorhexidine Gluconate Cloth 2 % PADS 6 each  6 each Topical Daily Megan Salon, MD   6 each at 08/11/20 [phone]  . diltiazem (CARDIZEM) tablet 60 mg  60 mg Oral Q8H Megan Salon, MD   60 mg at 08/11/20 1414  . iron polysaccharides (NIFEREX) capsule 150 mg  150 mg Oral Daily Regalado, Belkys A, MD   150 mg at 08/11/20 0949  . lactated ringers infusion   Intravenous Continuous Oleta Mouse, MD 10 mL/hr at 08/08/20 0822 New Bag at 08/08/20 0927  . menthol-cetylpyridinium (CEPACOL) lozenge 3 mg  1 lozenge Oral Q2H PRN Megan Salon,  MD      . metoprolol tartrate (LOPRESSOR) tablet 100 mg  100 mg Oral BID Megan Salon, MD   100 mg at 08/11/20 0948  . ondansetron (ZOFRAN) tablet 4 mg  4 mg Oral Q6H PRN Megan Salon, MD       Or  . ondansetron [facility]) injection 4 mg  4 mg Intravenous Q6H PRN Megan Salon, MD      . pantoprazole (PROTONIX) EC tablet 40 mg  40 mg Oral Daily Karmen Bongo, MD   40 mg at 08/11/20 0948  . simethicone (MYLICON) chewable tablet 80 mg  80 mg Oral QID PRN Megan Salon, MD      . sulfamethoxazole-trimethoprim (BACTRIM) 400-80 MG per tablet 1 tablet  1 tablet Oral Daily Megan Salon, MD   1 tablet at 08/11/20 4259    OBJECTIVE:   There were no vitals filed for this visit.   There is no height or weight on file to calculate BMI.   Wt Readings from Last 3 Encounters:  08/11/20 145 lb 15.1 oz (66.2 kg)  07/15/20 145 lb 6.4 oz (66 kg)  07/14/20 144 lb (65.3 kg)      ECOG FS:   LAB RESULTS:  CMP     Component Value Date/Time   NA 138 08/10/2020 0238   NA 145 (H) 05/06/2019 1608   K 4.0 08/10/2020 0238   CL 106 08/10/2020 0238   CO2 26 08/10/2020 0238   GLUCOSE 89 08/10/2020 0238   BUN 14 08/10/2020 0238   BUN 19 05/06/2019 1608   CREATININE 1.18 (H) 08/10/2020 0238   CALCIUM 7.9 (L) 08/10/2020 0238   PROT 6.5 08/08/2020 0101   PROT 6.6 05/06/2019 1608   ALBUMIN 3.3 (L) 08/08/2020 0101   ALBUMIN 3.9 05/06/2019 1608   AST 17 08/08/2020 0101   ALT 14 08/08/2020 0101   ALKPHOS 79 08/08/2020 0101   BILITOT 0.7 08/08/2020 0101   BILITOT 0.4 05/06/2019 1608   GFRNONAA 44 (L) 08/10/2020 0238   GFRAA 51 (L) 05/12/2020 1240    No results found for: TOTALPROTELP, ALBUMINELP, A1GS, A2GS, BETS, BETA2SER, GAMS, MSPIKE, SPEI  No results found for: KPAFRELGTCHN, LAMBDASER, KAPLAMBRATIO  Lab Results  Component Value Date   WBC 9.7 08/11/2020   NEUTROABS 7.4 08/08/2020   HGB 10.1 (L) 08/11/2020   HCT 30.9 (L) 08/11/2020   MCV 90.9 08/11/2020   PLT 164 08/11/2020    No  results found for: LABCA2  No components found for: DGLOVF643  Recent Labs  Lab 08/08/20 0055  INR 1.5*    No results found for: LABCA2  No results found for: PIR518  No results found for: ACZ660  No results found for: YTK160  No results found for: CA2729  No components found for: HGQUANT  No results found for: CEA1 / No results found for: CEA1   No results found for: AFPTUMOR  No results found for: CHROMOGRNA  No results found for: HGBA, HGBA2QUANT, HGBFQUANT, HGBSQUAN (Hemoglobinopathy evaluation)   No results found for: LDH  No results found for: IRON, TIBC, IRONPCTSAT (Iron and TIBC)  No results found for: FERRITIN  Urinalysis    Component Value Date/Time   COLORURINE YELLOW 08/29/2017 2343   APPEARANCEUR CLOUDY (A) 08/29/2017 2343   LABSPEC 1.005 08/29/2017 2343   PHURINE 6.0 08/29/2017 2343   GLUCOSEU NEGATIVE 08/29/2017 2343   HGBUR SMALL (A) 08/29/2017 2343   BILIRUBINUR n 04/14/2020 [place] 08/29/2017 2343   PROTEINUR Negative 04/14/2020 1705   PROTEINUR NEGATIVE 08/29/2017 2343   UROBILINOGEN 0.2 04/14/2020 1705   NITRITE n 04/14/2020 1705   NITRITE NEGATIVE 08/29/2017 2343   LEUKOCYTESUR Negative 04/14/2020 1705    STUDIES: DG Chest 2 View  Result Date: 08/10/2020 CLINICAL DATA:  Shortness of breath. Cough. History of breast carcinoma EXAM: CHEST - 2 VIEW COMPARISON:  Chest CT June 29, 2020; chest radiograph May 09, 2019. FINDINGS: There is apparent scarring in the left base  region. There is no edema or consolidation. Heart is upper normal in size with pulmonary vascularity normal. No adenopathy. Postoperative change left axillary region. There is aortic atherosclerosis. Degenerative change noted in each shoulder. IMPRESSION: Scarring left base. No edema or airspace opacity. Heart upper normal in size. No adenopathy by radiography. Aortic Atherosclerosis (ICD10-I70.0). Electronically Signed   By: Lowella Grip III  M.D.   On: 08/10/2020 13:54   US BREAST LTD UNI LEFT INC AXILLA  Result Date: 07/31/2020 CLINICAL DATA:  86 year old female with history of left breast cancer diagnosed in March 2021 status post lumpectomy and radiation. Patient presents for evaluation of a mass seen in the left breast on recent CT scan. EXAM: DIGITAL DIAGNOSTIC LEFT MAMMOGRAM WITH TOMO ULTRASOUND LEFT BREAST COMPARISON:  Previous exam(s). ACR Breast Density Category c: The breast tissue is heterogeneously dense, which may obscure small masses. FINDINGS: Mammogram: Full field tomosynthesis and a spot 2D magnification view of the lumpectomy site were performed. There is a large oval mass at the lumpectomy site measuring approximately 5.3 cm. There is mild diffuse skin thickening consistent with post radiation change. There are no additional new findings elsewhere in the left breast. On physical exam, I palpate firmness throughout the upper inner aspect of the left breast. There is no erythema or skin breakdown. Ultrasound: Targeted ultrasound is performed in the left breast at 12 o'clock 3 cm from nipple at the site of the patient's lumpectomy scar demonstrating an oval circumscribed complex mass with fluid components as well as fibrinous material, likely a retracting hematoma. No internal vascularity. IMPRESSION: At the left breast lumpectomy site there is a complex fluid collection favored to represent a retracting hematoma. No mammographic or sonographic evidence of malignancy. No imaging or clinical signs of infection. RECOMMENDATION: Return for routine diagnostic surveillance which will be due in March 2022. I have discussed the findings and recommendations with the patient. If applicable, a reminder letter will be sent to the patient regarding the next appointment. BI-RADS CATEGORY  2: Benign. Electronically Signed   By: Audie Pinto M.D.   On: 07/31/2020 13:30   MM DIAG BREAST TOMO UNI LEFT  Result Date: 07/31/2020 CLINICAL  DATA:  86 year old female with history of left breast cancer diagnosed in March 2021 status post lumpectomy and radiation. Patient presents for evaluation of a mass seen in the left breast on recent CT scan. EXAM: DIGITAL DIAGNOSTIC LEFT MAMMOGRAM WITH TOMO ULTRASOUND LEFT BREAST COMPARISON:  Previous exam(s). ACR Breast Density Category c: The breast tissue is heterogeneously dense, which may obscure small masses. FINDINGS: Mammogram: Full field tomosynthesis and a spot 2D magnification view of the lumpectomy site were performed. There is a large oval mass at the lumpectomy site measuring approximately 5.3 cm. There is mild diffuse skin thickening consistent with post radiation change. There are no additional new findings elsewhere in the left breast. On physical exam, I palpate firmness throughout the upper inner aspect of the left breast. There is no erythema or skin breakdown. Ultrasound: Targeted ultrasound is performed in the left breast at 12 o'clock 3 cm from nipple at the site of the patient's lumpectomy scar demonstrating an oval circumscribed complex mass with fluid components as well as fibrinous material, likely a retracting hematoma. No internal vascularity. IMPRESSION: At the left breast lumpectomy site there is a complex fluid collection favored to represent a retracting hematoma. No mammographic or sonographic evidence of malignancy. No imaging or clinical signs of infection. RECOMMENDATION: Return for routine diagnostic surveillance which will  be due in March 2022. I have discussed the findings and recommendations with the patient. If applicable, a reminder letter will be sent to the patient regarding the next appointment. BI-RADS CATEGORY  2: Benign. Electronically Signed   By: Audie Pinto M.D.   On: 07/31/2020 13:30     ELIGIBLE FOR AVAILABLE RESEARCH PROTOCOL: No  ASSESSMENT: 86 y.o. [place] woman   RIGHT BREAST CANCER (1) status post right mastectomy and sentinel lymph node  sampling September 2012 for what appears to have been a multifocal T1-T2 N1 (mic) invasive ductal carcinoma, estrogen and progesterone receptor positive, HER-2 not amplified  (2) completed 5 years of anastrozole 2017  LEFT BREAST CANCER (3) status post left breast upper inner quadrant biopsy 12/10/2019 for a clinical T1b N0, stage IB invasive ductal carcinoma grade 3, triple negative, with an MIB-1 of 90%  (4) status post left lumpectomy and sentinel lymph node sampling 01/22/2020 for a pT1a pN0, stage IA invasive ductal carcinoma, with negative margins  (a) ductal carcinoma in situ with necrosis also removed, all margins negative  (b) a total of 2 lymph nodes were removed  (5) adjuvant chemotherapy discussed, opted against  (6) meets criteria for genetics testing  (7) adjuvant radiation 03/02/2020 through 03/24/2020 Site Technique Total Dose (Gy) Dose per Fx (Gy) Completed Fx Beam Energies  Breast, Left: Breast_Lt 3D 42.72/42.72 2.67 16/16 6X, 15X    PLAN: Mortimer Fries is currently hospitalized.  We will follow up with her in the hospital and schedule follow-up appointment outpatient later  Chauncey Cruel, MD   08/11/2020 6:43 PM Medical Oncology and Hematology [facility] [place], [place] [address] Tel. [phone]    Fax. [phone]   This document serves as a record of services personally performed by Lurline Del, MD. It was created on his behalf by Wilburn Mylar, a trained medical scribe. The creation of this record is based on the scribe's personal observations and the provider's statements to them.   I, Lurline Del MD, have reviewed the above documentation for accuracy and completeness, and I agree with the above.   *Total Encounter Time as defined by the Centers for Medicare and Medicaid Services includes, in addition to the face-to-face time of a patient visit (documented in the note above) non-face-to-face time: obtaining and  reviewing outside history, ordering and reviewing medications, tests or procedures, care coordination (communications with other health care professionals or caregivers) and documentation in the medical record.

## 2020-08-10 NOTE — Progress Notes (Signed)
2 Days Post-Op Procedure(s) (LRB): EXAM UNDER ANESTHESIA WITH TREATMENT OF BLEEDING VAGINAL SIDEWALL LESION (N/A) VAGINAL SIDEWALL BIOPSY (N/A)  Subjective: Patient reports no known bleeding.  She has a vaginal packing which is coming out.  She denies pain.   She reports diarrhea. Eating a regular diet.   Has a foley catheter in place.   Some shortness of breath with getting up out of bed.   Her husband is present at bedside.   Objective: I have reviewed patient's vital signs, intake and output and labs.  Today's Vitals   08/10/20 0437 08/10/20 0931 08/10/20 1040 08/10/20 1153  BP: (!) 112/49 (!) 114/57    Pulse: 76 72    Resp: 15 17    Temp: 98.1 F (36.7 C) 97.8 F (36.6 C)    TempSrc: Oral     SpO2: 94% 96%  92%  Weight:      Height:      PainSc:   0-No pain    Body mass index is 28.51 kg/m.   CBC    Component Value Date/Time   WBC 10.8 (H) 08/10/2020 0238   RBC 3.38 (L) 08/10/2020 0238   HGB 9.9 (L) 08/10/2020 0238   HGB 12.4 05/06/2019 1608   HCT 30.2 (L) 08/10/2020 0238   HCT 36.6 05/06/2019 1608   PLT 144 (L) 08/10/2020 0238   PLT 237 05/06/2019 1608   MCV 89.3 08/10/2020 0238   MCV 92 05/06/2019 1608   MCH 29.3 08/10/2020 0238   MCHC 32.8 08/10/2020 0238   RDW 16.5 (H) 08/10/2020 0238   RDW 12.7 05/06/2019 1608   LYMPHSABS 1.3 08/08/2020 0055   MONOABS 1.0 08/08/2020 0055   EOSABS 0.2 08/08/2020 0055   BASOSABS 0.0 08/08/2020 0055   I/O - 360 cc/500 cc  Foley draining clear yellow urine.   General: alert and cooperative Resp: clear to auscultation bilaterally Cardio: regular rate and rhythm, S1, S2 normal, no murmur, click, rub or gallop GI: soft, non-tender; bowel sounds normal; no masses,  no organomegaly Extremities: vaginal packing removed and slightly blood stained.  Vaginal Bleeding: minimal  No active bleeding noted.  Ext:  PAS on.   Assessment: s/p Procedure(s): EXAM UNDER ANESTHESIA WITH TREATMENT OF BLEEDING VAGINAL SIDEWALL  LESION (N/A) VAGINAL SIDEWALL BIOPSY (N/A): stable  Hemoglobin improved.  Off Elaquis.   Plan: Continue Foley catheter.  Increase physical activity.  Regular diet.  Plan for CBC in the am.  Hospitalist team will evaluate shortness of breath.  I spoke with Dr. Tyrell Antonio just now.  Will likely resume Eliquis tomorrow if bleeding remains stable.    LOS: 1 day    Arloa Koh 08/10/2020, 12:36 PM

## 2020-08-11 ENCOUNTER — Inpatient Hospital Stay: Payer: Medicare Other | Attending: Oncology | Admitting: Oncology

## 2020-08-11 ENCOUNTER — Telehealth: Payer: Self-pay | Admitting: Cardiology

## 2020-08-11 DIAGNOSIS — N939 Abnormal uterine and vaginal bleeding, unspecified: Secondary | ICD-10-CM

## 2020-08-11 DIAGNOSIS — N898 Other specified noninflammatory disorders of vagina: Secondary | ICD-10-CM

## 2020-08-11 DIAGNOSIS — N814 Uterovaginal prolapse, unspecified: Secondary | ICD-10-CM

## 2020-08-11 LAB — CBC
HCT: 30.9 % — ABNORMAL LOW (ref 36.0–46.0)
Hemoglobin: 10.1 g/dL — ABNORMAL LOW (ref 12.0–15.0)
MCH: 29.7 pg (ref 26.0–34.0)
MCHC: 32.7 g/dL (ref 30.0–36.0)
MCV: 90.9 fL (ref 80.0–100.0)
Platelets: 164 10*3/uL (ref 150–400)
RBC: 3.4 MIL/uL — ABNORMAL LOW (ref 3.87–5.11)
RDW: 16.1 % — ABNORMAL HIGH (ref 11.5–15.5)
WBC: 9.7 10*3/uL (ref 4.0–10.5)
nRBC: 0 % (ref 0.0–0.2)

## 2020-08-11 LAB — SURGICAL PATHOLOGY

## 2020-08-11 LAB — URIC ACID: Uric Acid, Serum: 5.9 mg/dL (ref 2.5–7.1)

## 2020-08-11 MED ORDER — POLYSACCHARIDE IRON COMPLEX 150 MG PO CAPS
150.0000 mg | ORAL_CAPSULE | Freq: Every day | ORAL | 0 refills | Status: DC
Start: 2020-08-12 — End: 2020-12-21

## 2020-08-11 MED ORDER — APIXABAN 5 MG PO TABS
5.0000 mg | ORAL_TABLET | Freq: Two times a day (BID) | ORAL | Status: DC
  Administered 2020-08-11: 5 mg via ORAL
  Filled 2020-08-11: qty 1

## 2020-08-11 NOTE — Progress Notes (Addendum)
PROGRESS NOTE    Adrienne [name]  ZWC:[id number] DOB: [birth year] DOA: 08/08/2020 PCP: Kathyrn Lass, MD   Brief Narrative: 86 year old with past medical history significant for uterine prolapse requiring pessary use, with urinary retention if pessary is not in place, CVA, pulmonary hypertension, hypertension, remote breast cancer with recent recurrence, A. fib on Eliquis presents with vaginal bleeding.  The patient reports that she developed significant bleeding that started the night of admission about 9 PM.  Continue to have heavy bleeding despite packing attempted in the ED. she does have chronic shortness of breath and was a started on as needed home oxygen.    Assessment & Plan:   Principal Problem:   Vaginal bleeding Active Problems:   Chronic atrial fibrillation (HCC)   Hyperlipidemia   Essential hypertension   History of stroke   Coronary artery disease due to lipid rich plaque   Malignant neoplasm of upper-inner quadrant of left breast in female, estrogen receptor negative (HCC)   Uterine prolapse   Vaginal erosion secondary to pessary use ([place])   Acute urinary retention   Chronic respiratory failure ([place])   Palliative care by specialist   Goals of care, counseling/discussion   DNR (do not resuscitate) discussion   1-Vaginal Bleeding secondary to possible vaginal sidewall Mass Vs Bleeding from Granulation tissue of cervix:  -Received 4 units PRBC.  -Hb drop from 11---to 8.6---9.9 -Packing was removed 11/29. -no significant bleeding.  -HB increase to 10/  -Continue with oral iron supplement.  -Restarted Eliquis.    2-Urinary Retention:  Uterine prolapse requiring pessary use, with urinary retention if pessary is not in place. Continue with Foley catheter.  3-A fib, history of CVA;  Continue with Lopressor and Diltiazem EKG with persistent A fib.  Plan to resume Eliquis today.   4-Breast Cancer; follow with Dr. Jana Hakim  5-Chronic Respiratory  Failure;  Dyspnea on exertion, chest x ray clear. No pulmonary edema.  Dyspnea probably related to anemia. Started ferrous sulfate.  She report Dyspnea is stable today, is not worse. Plan to check oxygen on exertion. Walk to the door with PT oxygen drop to 89, she was uncomfortable and was not able to walk due to foot pain.  Will place on Oxygen and check oxygen on exertion later.   Addendum: came to check on patient this afternoon.  Patient feels a little better with 2 L oxygen, mild tachypnea notice. Weight stable. No need for lasix.  -She was able to walk with PT this afternoon, two laps. Did better .  -patient report this afternoon she feels well, and wishes to go home.  -Plan to arrange Home oxygen.    HTN; Continue with lopressor and Diltiazem.   CAD; HLD; continue with beta-blocker.  Foot pain: Could be Plantar fascitis; rest , ice, tylenol, stretching exercise. Will check for gout, check uric acid.   Goals of care: Goals of care.  Thank you For the consult.  Needs to check oxygen on exertion prior to discharge.  Check uric acid.   Estimated body mass index is 28.51 kg/m as calculated from the following:   Height as of this encounter: 5' (1.524 m).   Weight as of this encounter: 66.2 kg.   DVT prophylaxis: SCD Code Status: Full Code Family Communication: Disposition Plan:  Status is: Observation  The patient remains OBS appropriate and will d/c before 2 midnights.  Dispo: The patient is from: Home              Anticipated d/c  is to: Home              Anticipated d/c date is: 1 day              Patient currently is medically stable to d/c.Home today if remain stable.        Subjective: She report minimal vaginal bleeding. She report SOB when she was taking a shower, on exertion. She relates Dyspnea is stable from yesterday. She report pain in her left foot, plantar aspect, no redness, no discoloration.   Objective: Vitals:   08/10/20 1432 08/10/20 1651  08/10/20 2056 08/11/20 0454  BP: (!) 149/67 (!) 115/48 130/63 (!) 122/45  Pulse: 69 67 71 72  Resp:  16 17 15   Temp:  98.2 F (36.8 C) 98 F (36.7 C) (!) 97.5 F (36.4 C)  TempSrc:  Oral Oral Oral  SpO2: 99% 95% 96% 93%  Weight:      Height:        Intake/Output Summary (Last 24 hours) at 08/11/2020 1024 Last data filed at 08/11/2020 1324 Gross per 24 hour  Intake 900 ml  Output 2850 ml  Net -1950 ml   Filed Weights   08/08/20 0037  Weight: 66.2 kg    Examination:  General exam: NAD Respiratory system: CTA Cardiovascular system: S 1, S 2 IRR Gastrointestinal system: BS present, soft, nt Central nervous system: Alert, follows command Extremities; no edema   Data Reviewed: I have personally reviewed following labs and imaging studies  CBC: Recent Labs  Lab 08/08/20 0055 08/08/20 0055 08/08/20 0539 08/08/20 1515 08/09/20 0117 08/10/20 0238 08/11/20 0325  WBC 9.9  --   --   --  9.1 10.8* 9.7  NEUTROABS 7.4  --   --   --   --   --   --   HGB 11.0*   < > 8.5* 8.9* 8.6* 9.9* 10.1*  HCT 34.8*   < > 25.0* 27.0* 25.9* 30.2* 30.9*  MCV 95.6  --   --   --  89.9 89.3 90.9  PLT 245  --   --   --  130* 144* 164   < > = values in this interval not displayed.   Basic Metabolic Panel: Recent Labs  Lab 08/08/20 0101 08/08/20 0539 08/09/20 0117 08/10/20 0238  NA 138 140 138 138  K 4.2 4.1 4.1 4.0  CL 101 101 105 106  CO2 26  --  27 26  GLUCOSE 166* 112* 98 89  BUN 16 16 17 14   CREATININE 1.17* 1.10* 1.14* 1.18*  CALCIUM 9.1  --  7.6* 7.9*   GFR: Estimated Creatinine Clearance: 26.9 mL/min (A) (by C-G formula based on SCr of 1.18 mg/dL (H)). Liver Function Tests: Recent Labs  Lab 08/08/20 0101  AST 17  ALT 14  ALKPHOS 79  BILITOT 0.7  PROT 6.5  ALBUMIN 3.3*   No results for input(s): LIPASE, AMYLASE in the last 168 hours. No results for input(s): AMMONIA in the last 168 hours. Coagulation Profile: Recent Labs  Lab 08/08/20 0055  INR 1.5*    Cardiac Enzymes: No results for input(s): CKTOTAL, CKMB, CKMBINDEX, TROPONINI in the last 168 hours. BNP (last 3 results) No results for input(s): PROBNP in the last 8760 hours. HbA1C: No results for input(s): HGBA1C in the last 72 hours. CBG: No results for input(s): GLUCAP in the last 168 hours. Lipid Profile: No results for input(s): CHOL, HDL, LDLCALC, TRIG, CHOLHDL, LDLDIRECT in the last 72 hours. Thyroid Function  Tests: No results for input(s): TSH, T4TOTAL, FREET4, T3FREE, THYROIDAB in the last 72 hours. Anemia Panel: No results for input(s): VITAMINB12, FOLATE, FERRITIN, TIBC, IRON, RETICCTPCT in the last 72 hours. Sepsis Labs: No results for input(s): PROCALCITON, LATICACIDVEN in the last 168 hours.  Recent Results (from the past 240 hour(s))  Resp Panel by RT-PCR (Flu A&B, Covid) Nasopharyngeal Swab     Status: None   Collection Time: 08/08/20  2:20 AM   Specimen: Nasopharyngeal Swab; Nasopharyngeal(NP) swabs in vial transport medium  Result Value Ref Range Status   SARS Coronavirus 2 by RT PCR NEGATIVE NEGATIVE Final    Comment: (NOTE) SARS-CoV-2 target nucleic acids are NOT DETECTED.  The SARS-CoV-2 RNA is generally detectable in upper respiratory specimens during the acute phase of infection. The lowest concentration of SARS-CoV-2 viral copies this assay can detect is 138 copies/mL. A negative result does not preclude SARS-Cov-2 infection and should not be used as the sole basis for treatment or other patient management decisions. A negative result may occur with  improper specimen collection/handling, submission of specimen other than nasopharyngeal swab, presence of viral mutation(s) within the areas targeted by this assay, and inadequate number of viral copies(<138 copies/mL). A negative result must be combined with clinical observations, patient history, and epidemiological information. The expected result is Negative.  Fact Sheet for Patients:   EntrepreneurPulse.com.au  Fact Sheet for Healthcare Providers:  IncredibleEmployment.be  This test is no t yet approved or cleared by the [place] FDA and  has been authorized for detection and/or diagnosis of SARS-CoV-2 by FDA under an Emergency Use Authorization (EUA). This EUA will remain  in effect (meaning this test can be used) for the duration of the COVID-19 declaration under Section 564(b)(1) of the Act, 21 U.S.C.section 360bbb-3(b)(1), unless the authorization is terminated  or revoked sooner.       Influenza A by PCR NEGATIVE NEGATIVE Final   Influenza B by PCR NEGATIVE NEGATIVE Final    Comment: (NOTE) The Xpert Xpress SARS-CoV-2/FLU/RSV plus assay is intended as an aid in the diagnosis of influenza from Nasopharyngeal swab specimens and should not be used as a sole basis for treatment. Nasal washings and aspirates are unacceptable for Xpert Xpress SARS-CoV-2/FLU/RSV testing.  Fact Sheet for Patients: EntrepreneurPulse.com.au  Fact Sheet for Healthcare Providers: IncredibleEmployment.be  This test is not yet approved or cleared by the [place] FDA and has been authorized for detection and/or diagnosis of SARS-CoV-2 by FDA under an Emergency Use Authorization (EUA). This EUA will remain in effect (meaning this test can be used) for the duration of the COVID-19 declaration under Section 564(b)(1) of the Act, 21 U.S.C. section 360bbb-3(b)(1), unless the authorization is terminated or revoked.  Performed at [facility], [address], [place], [place] [address]          Radiology Studies: DG Chest 2 View  Result Date: 08/10/2020 CLINICAL DATA:  Shortness of breath. Cough. History of breast carcinoma EXAM: CHEST - 2 VIEW COMPARISON:  Chest CT June 29, 2020; chest radiograph May 09, 2019. FINDINGS: There is apparent scarring in the left base region. There is no  edema or consolidation. Heart is upper normal in size with pulmonary vascularity normal. No adenopathy. Postoperative change left axillary region. There is aortic atherosclerosis. Degenerative change noted in each shoulder. IMPRESSION: Scarring left base. No edema or airspace opacity. Heart upper normal in size. No adenopathy by radiography. Aortic Atherosclerosis (ICD10-I70.0). Electronically Signed   By: Lowella Grip III M.D.  On: 08/10/2020 13:54        Scheduled Meds: . apixaban  5 mg Oral BID  . atorvastatin  10 mg Oral Daily  . Chlorhexidine Gluconate Cloth  6 each Topical Daily  . diltiazem  60 mg Oral Q8H  . iron polysaccharides  150 mg Oral Daily  . metoprolol tartrate  100 mg Oral BID  . pantoprazole  40 mg Oral Daily  . sulfamethoxazole-trimethoprim  1 tablet Oral Daily   Continuous Infusions: . lactated ringers 10 mL/hr at 08/08/20 0822     LOS: 2 days    Time spent: 35 Minutes.     Elmarie Shiley, MD [facility]   If 7PM-7AM, please contact night-coverage [email or web address]  08/11/2020, 10:24 AM

## 2020-08-11 NOTE — Progress Notes (Signed)
3 Days Post-Op Procedure(s) (LRB): EXAM UNDER ANESTHESIA WITH TREATMENT OF BLEEDING VAGINAL SIDEWALL LESION (N/A) VAGINAL SIDEWALL BIOPSY (N/A)  Subjective: Patient reports she did not sleep well.  She is anxious about not being on her Eliquis.  Internal medicine and cardiology consulted yesterday.  Patient had a normal chest x-ray.  EKG also done.   Medical teams deferring to GYN for restarting of the Eliquis.   States some mild bleeding during the night.   Foley catheter in place.   Objective: I have reviewed patient's vital signs, intake and output and labs. Vitals:   08/10/20 2056 08/11/20 0454  BP: 130/63 (!) 122/45  Pulse: 71 72  Resp: 17 15  Temp: 98 F (36.7 C) (!) 97.5 F (36.4 C)  SpO2: 96% 93%   900 cc/3050 cc CBC    Component Value Date/Time   WBC 9.7 08/11/2020 0325   RBC 3.40 (L) 08/11/2020 0325   HGB 10.1 (L) 08/11/2020 0325   HGB 12.4 05/06/2019 1608   HCT 30.9 (L) 08/11/2020 0325   HCT 36.6 05/06/2019 1608   PLT 164 08/11/2020 0325   PLT 237 05/06/2019 1608   MCV 90.9 08/11/2020 0325   MCV 92 05/06/2019 1608   MCH 29.7 08/11/2020 0325   MCHC 32.7 08/11/2020 0325   RDW 16.1 (H) 08/11/2020 0325   RDW 12.7 05/06/2019 1608   LYMPHSABS 1.3 08/08/2020 0055   MONOABS 1.0 08/08/2020 0055   EOSABS 0.2 08/08/2020 0055   BASOSABS 0.0 08/08/2020 0055     General: alert and cooperative Resp: clear to auscultation bilaterally Cardio: regular rate and rhythm, S1, S2 normal, no murmur, click, rub or gallop GI: soft, non-tender; bowel sounds normal; no masses,  no organomegaly Extremities: no edema, redness or tenderness in the calves or thighs and  PAS on right leg and off left leg (Foot pain). Vaginal Bleeding: minimal  Assessment: s/p Procedure(s): EXAM UNDER ANESTHESIA WITH TREATMENT OF BLEEDING VAGINAL SIDEWALL LESION (N/A) VAGINAL SIDEWALL BIOPSY (N/A): progressing well    Plan: Encourage ambulation Showe today. Restart Eliquis 5 mg po bid,  first dose now.  Continue usual cardiac meds.  Anticipate discharge to home this evening.    LOS: 2 days    Arloa Koh 08/11/2020, 7:54 AM

## 2020-08-11 NOTE — Progress Notes (Signed)
SATURATION QUALIFICATIONS: (This note is used to comply with regulatory documentation for home oxygen)  Patient Saturations on Room Air at Rest = 93%  Patient Saturations on Room Air while Ambulating = 87%  Patient Saturations on 2 Liters of oxygen while Ambulating = 93%  Please briefly explain why patient needs home oxygen:

## 2020-08-11 NOTE — Telephone Encounter (Signed)
Left message for patient to call back and schedule follow up appt

## 2020-08-11 NOTE — Progress Notes (Signed)
3 Days Post-Op Procedure(s) (LRB): EXAM UNDER ANESTHESIA WITH TREATMENT OF BLEEDING VAGINAL SIDEWALL LESION (N/A) VAGINAL SIDEWALL BIOPSY (N/A)  Subjective: Patient reports desires discharge.   Ambulated today and took a showed.   Had a BM.   Reports light vaginal bleeding.  Denies any pain.   She get get restarted on her Eliquis today, and is relieved to be back on this medication.   Had multiple providers in today for physical therapy and assessment of discharge needs.  She will be going home with O2 therapy for her shortness of breath.  She has a shower chair at home.   States she received papers for advanced directives, which she will review at home.   Objective: I have reviewed patient's vital signs and intake and output.  Vitals:   08/11/20 1414 08/11/20 1504  BP:  137/65  Pulse:  66  Resp:  18  Temp:  97.9 F (36.6 C)  SpO2: 97% 96%   I/O - 240 cc/300 cc.  Vaginal Bleeding: minimal  Assessment: s/p Procedure(s): EXAM UNDER ANESTHESIA WITH TREATMENT OF BLEEDING VAGINAL SIDEWALL LESION (N/A) VAGINAL SIDEWALL BIOPSY (N/A): progressing well and ready for discharge.   Plan: Discharge home  She will continue her Foley catheter and receive a leg bag.  She will not use her pessary.  She will resume her usual medications in her usual dosages.  I have prescribed Niferex 150 mg daily.  She will have consultation with [facility] and may consider home health care.  She will follow up with cardiology in 2 weeks. Follow up with with me for her gynecology visit in 2 weeks. She will call for increased vaginal bleeding and she has been instructed to call 911 for heavy vaginal bleeding, chest pain, or shortness of breath.     LOS: 2 days    Arloa Koh 08/11/2020, 6:43 PM

## 2020-08-11 NOTE — Progress Notes (Signed)
Physical Therapy Treatment Patient Details Name: Adrienne [name] MRN: [id number] DOB: 07-08-1928 Today's Date: 08/11/2020    History of Present Illness Adrienne [name] is an 86 y.o. female with h/o uterine prolapse requiring pessary use, with urinary retention if pessary is not in place; CVA; pulmonary HTN; HTN; HLD; remote breast cancer with recent recurrence; and afib on Eliquis presenting with vaginal bleeding.    PT Comments    Patient received in bed, reports her left foot is hurting. She got up to go to the bathroom this morning and could barely walk. Tender to touch on bottom of left foot. She agrees to try to walk. Mod independence with bed mobility. Transfers with min guard. Standing with RW and min guard, she ambulated 10 feet but was unable to continue due to pain in foot. O2 saturations down to 89% after walking 10 feet. Bed exercises performed. Patient will continue to benefit from skilled PT while here to improve functional mobility and safety.      Follow Up Recommendations  Home health PT     Equipment Recommendations  None recommended by PT    Recommendations for Other Services       Precautions / Restrictions Precautions Precautions: Fall Precaution Comments: mod fall, sob with mobility Restrictions Weight Bearing Restrictions: No    Mobility  Bed Mobility Overal bed mobility: Modified Independent Bed Mobility: Supine to Sit;Sit to Supine     Supine to sit: Modified independent (Device/Increase time);HOB elevated Sit to supine: Modified independent (Device/Increase time);HOB elevated   General bed mobility comments: No assist needed  Transfers Overall transfer level: Needs assistance Equipment used: Rolling walker (2 wheeled) Transfers: Sit to/from Stand Sit to Stand: Supervision         General transfer comment: Supervision for safety, no LOB. Using AD today due to left foot pain with weight  bearing  Ambulation/Gait Ambulation/Gait assistance: Min guard Gait Distance (Feet): 10 Feet Assistive device: Rolling walker (2 wheeled) Gait Pattern/deviations: Step-through pattern;Antalgic;Decreased weight shift to left     General Gait Details: patient gait limited this session due to left foot pain with touch and weight bearing.   Stairs             Wheelchair Mobility    Modified Rankin (Stroke Patients Only)       Balance Overall balance assessment: Needs assistance Sitting-balance support: Feet supported Sitting balance-Leahy Scale: Good     Standing balance support: Bilateral upper extremity supported;During functional activity Standing balance-Leahy Scale: Fair Standing balance comment: reliant on RW this date due to foot pain                            Cognition Arousal/Alertness: Awake/alert Behavior During Therapy: WFL for tasks assessed/performed Overall Cognitive Status: Within Functional Limits for tasks assessed                                        Exercises Other Exercises Other Exercises: B LE Supine exercises: ap, heel slides, hip abd/ add, SLR x 10 reps each. Resting between    General Comments        Pertinent Vitals/Pain Pain Assessment: Faces Faces Pain Scale: Hurts whole lot Pain Location: L foot Pain Descriptors / Indicators: Grimacing;Guarding;Sore;Discomfort;Tender Pain Intervention(s): Limited activity within patient's tolerance;Monitored during session;Repositioned;Ice applied;Premedicated before session    Home Living  Prior Function            PT Goals (current goals can now be found in the care plan section) Acute Rehab PT Goals Patient Stated Goal: to return home PT Goal Formulation: With patient Time For Goal Achievement: 08/17/20 Potential to Achieve Goals: Good Progress towards PT goals: Not progressing toward goals - comment (limited by pain in foot  this session)    Frequency    Min 3X/week      PT Plan Current plan remains appropriate    Co-evaluation              AM-PAC PT "6 Clicks" Mobility   Outcome Measure  Help needed turning from your back to your side while in a flat bed without using bedrails?: None Help needed moving from lying on your back to sitting on the side of a flat bed without using bedrails?: None Help needed moving to and from a bed to a chair (including a wheelchair)?: A Little Help needed standing up from a chair using your arms (e.g., wheelchair or bedside chair)?: A Little Help needed to walk in hospital room?: A Little Help needed climbing 3-5 steps with a railing? : A Little 6 Click Score: 20    End of Session Equipment Utilized During Treatment: Gait belt Activity Tolerance: Patient limited by pain Patient left: in bed;with call bell/phone within reach Nurse Communication: Mobility status PT Visit Diagnosis: Unsteadiness on feet (R26.81);Muscle weakness (generalized) (M62.81);Difficulty in walking, not elsewhere classified (R26.2);Pain Pain - Right/Left: Left Pain - part of body: Ankle and joints of foot     Time: 1030-1047 PT Time Calculation (min) (ACUTE ONLY): 17 min  Charges:  $Therapeutic Exercise: 8-22 mins                     Hadessah Grennan, PT, GCS 08/11/20,11:08 AM

## 2020-08-11 NOTE — TOC Initial Note (Signed)
Transition of Care [facility]) - Initial/Assessment Note    Patient Details  Name: Adrienne [name] MRN: [id number] Date of Birth: 12-11-1927  Transition of Care [facility]) CM/SW Contact:    Marilu Favre, RN Phone Number: 08/11/2020, 3:08 PM  Clinical Narrative:                  Spoke to patient and husband at bedside.   TOC team previous arranged palliative care services with [facility].  NCM confirmed face sheet information. Discussed HHPT with patient and husband. Offered choice ( medicare.gov list ) they had no preference. Referral given to and accepted by [facility] with [facility].   Order for oxygen , called Freda Munro with [facility]. Will need oxygenation saturation note. Secure chatted PT awaiting response. Bedside nurse aware.    Explained to patient and husband, [facility] will bring portable tank to bedside prior to patient leaving.    Expected Discharge Plan: [place] Barriers to Discharge: No Barriers Identified   Patient Goals and CMS Choice Patient states their goals for this hospitalization and ongoing recovery are:: to return to home CMS Medicare.gov Compare Post Acute Care list provided to:: Patient Choice offered to / list presented to : Patient, Spouse  Expected Discharge Plan and Services Expected Discharge Plan: [place]   Discharge Planning Services: CM Consult Post Acute Care Choice: Home Health, Durable Medical Equipment Living arrangements for the past 2 months: Single Family Home                 DME Arranged: Oxygen DME Agency: [employer] Date DME Agency Contacted: 08/11/20   Representative spoke with at DME Agency: Freda Munro HH Arranged: PT, OT [facility] Agency: Other - See comment ([place] home [facility]) Date HH Agency Contacted: 08/11/20 Time HH Agency Contacted: 13 Representative spoke with at [place]: [place] Arrangements/Services Living arrangements for the past 2 months: [place] with:: Spouse Patient language and need for interpreter reviewed:: Yes Do you feel safe going back to the place where you live?: Yes      Need for Family Participation in Patient Care: Yes (Comment) Care giver support system in place?: Yes (comment)   Criminal Activity/Legal Involvement Pertinent to Current Situation/Hospitalization: No - Comment as needed  Activities of Daily Living Home Assistive Devices/Equipment: Walker (specify type) ADL Screening (condition at time of admission) Patient's cognitive ability adequate to safely complete daily activities?: Yes Is the patient deaf or have difficulty hearing?: Yes Does the patient have difficulty seeing, even when wearing glasses/contacts?: Yes Does the patient have difficulty concentrating, remembering, or making decisions?: No Patient able to express need for assistance with ADLs?: No Does the patient have difficulty dressing or bathing?: No Independently performs ADLs?: Yes (appropriate for developmental age) Does the patient have difficulty walking or climbing stairs?: Yes Weakness of Legs: Both Weakness of Arms/Hands: None  Permission Sought/Granted Permission sought to share information with : Case Manager Permission granted to share information with : No              Emotional Assessment Appearance:: Appears stated age Attitude/Demeanor/Rapport: Engaged Affect (typically observed): Accepting Orientation: : Oriented to Self, Oriented to Place, Oriented to  Time, Oriented to Situation Alcohol / Substance Use: Not Applicable Psych Involvement: No (comment)  Admission diagnosis:  Vaginal bleeding [N93.9] Patient Active Problem List   Diagnosis Date Noted   Palliative care by specialist    Goals of care,  counseling/discussion    DNR (do not resuscitate) discussion    Vaginal bleeding 08/08/2020   Uterine prolapse 08/08/2020   Vaginal erosion secondary to pessary use ([place]) 08/08/2020   Acute  urinary retention 08/08/2020   Chronic respiratory failure ([place]) 08/08/2020   Multiple pulmonary nodules 05/24/2020   Chronic cough 05/24/2020   Malignant neoplasm of upper-inner quadrant of left breast in female, estrogen receptor negative ([place]) 12/20/2019   Malignant neoplasm of overlapping sites of right breast in female, estrogen receptor positive ([place]) 08/14/2019   Osteopenia 08/14/2019   Diplopia 01/29/2016   GERD (gastroesophageal reflux disease) 01/29/2016   Cerebrovascular accident (CVA) due to embolism of left middle cerebral artery ([place]) 01/29/2016   Persistent atrial fibrillation (HCC)    Bleeding gastrointestinal    Gait disturbance, post-stroke 01/27/2016   Fall    Secondary hypertension, unspecified    Cerebral infarction due to thrombosis of left middle cerebral artery ([place]) s/p mechanical thrombectomy    Malnutrition of moderate degree 01/09/2016   GI bleed 01/06/2016   Chronic atrial fibrillation ([place]) 06/10/2015   Chronic anticoagulation 06/10/2015   Hyperlipidemia 06/10/2015   Essential hypertension 06/10/2015   History of stroke 06/10/2015   Coronary artery disease due to lipid rich plaque 06/10/2015   Elevated uric acid in blood    Arthritis of ankle    Pulmonary HTN ([place])    PCP:  Kathyrn Lass, MD Pharmacy:   [facility] [place], [place] - [address] [place] [address] Phone: [phone] Fax: [phone]  [facility] - [place], [place] [address] [place] [address] Phone: [phone] Fax: [phone]     Social Determinants of Health (SDOH) Interventions    Readmission Risk Interventions No flowsheet data found.

## 2020-08-11 NOTE — Progress Notes (Signed)
Physical Therapy Treatment Patient Details Name: Adrienne [name] MRN: [id number] DOB: [birth year] Today's Date: 08/11/2020    History of Present Illness Adrienne [name] is an 86 y.o. female with h/o uterine prolapse requiring pessary use, with urinary retention if pessary is not in place; CVA; pulmonary HTN; HTN; HLD; remote breast cancer with recent recurrence; and afib on Eliquis presenting with vaginal bleeding.    PT Comments    Patient received in recliner. Reports she is feeling better this pm. Foot pain improved. Patient on 2 lpm O2 and sats at 97% at rest. She requires supervision for transfers. Ambulated 250 feet with RW on room air. Patient's O2 saturations dropped to 87% after walking this distance. Supplemental O2 returned to patient. MD notified.  Discussed O2 needs and to purchase pulse oximeter for home use. Patient will continue to benefit from skilled PT while here to improve functional independence and strength.         Follow Up Recommendations  Home health PT     Equipment Recommendations  None recommended by PT    Recommendations for Other Services       Precautions / Restrictions Precautions Precautions: Fall Precaution Comments: mod fall, sob with mobility Restrictions Weight Bearing Restrictions: No    Mobility  Bed Mobility Overal bed mobility: Modified Independent Bed Mobility: Supine to Sit;Sit to Supine     Supine to sit: Modified independent (Device/Increase time);HOB elevated Sit to supine: Modified independent (Device/Increase time);HOB elevated   General bed mobility comments: patient in recliner  Transfers Overall transfer level: Needs assistance Equipment used: Rolling walker (2 wheeled) Transfers: Sit to/from Stand Sit to Stand: Supervision         General transfer comment: Supervision for safety, no LOB. Using AD today due to left foot pain with weight bearing  Ambulation/Gait Ambulation/Gait assistance: Min  guard Gait Distance (Feet): 250 Feet Assistive device: Rolling walker (2 wheeled) Gait Pattern/deviations: Step-through pattern;Decreased weight shift to left Gait velocity: decr   General Gait Details: improved ambulation this pm. Foot pain improved. Ambulated on room air. O2 sats down to 87% on room air.   Stairs             Wheelchair Mobility    Modified Rankin (Stroke Patients Only)       Balance Overall balance assessment: Modified Independent Sitting-balance support: Feet supported Sitting balance-Leahy Scale: Good     Standing balance support: Bilateral upper extremity supported;During functional activity Standing balance-Leahy Scale: Fair Standing balance comment: reliant on RW this date due to foot pain                            Cognition Arousal/Alertness: Awake/alert Behavior During Therapy: WFL for tasks assessed/performed Overall Cognitive Status: Within Functional Limits for tasks assessed                                        Exercises Other Exercises Other Exercises: B LE Supine exercises: ap, heel slides, hip abd/ add, SLR x 10 reps each. Resting between    General Comments        Pertinent Vitals/Pain Pain Assessment: Faces Faces Pain Scale: Hurts a little bit Pain Location: L foot Pain Descriptors / Indicators: Discomfort;Sore Pain Intervention(s): Monitored during session    Home Living  Prior Function            PT Goals (current goals can now be found in the care plan section) Acute Rehab PT Goals Patient Stated Goal: to return home PT Goal Formulation: With patient Time For Goal Achievement: 08/17/20 Potential to Achieve Goals: Good Progress towards PT goals: Progressing toward goals    Frequency    Min 3X/week      PT Plan Current plan remains appropriate    Co-evaluation              AM-PAC PT "6 Clicks" Mobility   Outcome Measure  Help needed  turning from your back to your side while in a flat bed without using bedrails?: None Help needed moving from lying on your back to sitting on the side of a flat bed without using bedrails?: None Help needed moving to and from a bed to a chair (including a wheelchair)?: A Little Help needed standing up from a chair using your arms (e.g., wheelchair or bedside chair)?: A Little Help needed to walk in hospital room?: A Little Help needed climbing 3-5 steps with a railing? : A Little 6 Click Score: 20    End of Session Equipment Utilized During Treatment: Gait belt;Oxygen Activity Tolerance: Patient tolerated treatment well Patient left: in chair;with call bell/phone within reach;with family/visitor present Nurse Communication: Mobility status;Other (comment) (need for home O2) PT Visit Diagnosis: Unsteadiness on feet (R26.81);Muscle weakness (generalized) (M62.81);Difficulty in walking, not elsewhere classified (R26.2);Pain Pain - Right/Left: Left Pain - part of body: Ankle and joints of foot     Time: 1358-1415 PT Time Calculation (min) (ACUTE ONLY): 17 min  Charges:  $Gait Training: 8-22 mins $Therapeutic Exercise: 8-22 mins                     Chantry Headen, PT, GCS 08/11/20,2:28 PM

## 2020-08-11 NOTE — Telephone Encounter (Signed)
-----   Message from Theora Gianotti, NP sent at 08/10/2020  6:05 PM EST ----- Good morning,  Would you pls set Ms. Tolosa up for a f/u w/ [facility] or APP in ~ 2-3 wks?  Thanks,  Gerald Stabs

## 2020-08-11 NOTE — Discharge Instructions (Signed)
[name],   You have done well with your surgical care for vaginal bleeding and with your transfusion for the anemia caused by your bleeding.   We will leave your foley catheter in your bladder until the vagina heals from surgery.   You will not be using your pessary at this time.   Please call [facility] if you have an increase in your bleeding or develop pelvic pain.   If you have heavy bleeding again, please call 911 to have an ambulance take you to the hospital.   Also call 911 if you are having any significant shortness of breath or chest pain.  Thank you for being my patient!  Josefa Half, MD

## 2020-08-11 NOTE — Progress Notes (Signed)
Palliative:  HPI: 86 y.o.femalewith h/o uterine prolapse requiring pessary use, with urinary retention if pessary is not in place; CVA; pulmonary HTN; HTN; HLD; remote breast cancer with recent recurrence; and afib on Eliquis presenting with vaginal bleeding.The patient reports that the significant bleeding started last night about 9pm. She has continued to have recurrent heavy bleeding despite attempts at packing in the ER. She denies CP. She does have chronic SOB and was started on prn home O2 after she started radiation therapy for breast cancer after recurrence in May.  I met today with Adrienne [name] and her husband at bedside. I introduced myself from palliative care and she recalls conversation with my colleague regarding resuscitation and her wishes. Adrienne [name] shares that she understands the importance of having these discussions "especially at my age." She does express that she is leaning towards DNR as she does not feel that she would want to go through extraordinary measures and the suffering that goes along with this process. However, she would like to continue discussions with her family and is not prepared for a final decision at this point. We discussed outpatient palliative care referral that can help them with their final decisions and appropriate paperwork. Adrienne [name] and her husband are hopeful to get home soon. She is still having some shortness of breath but this is being worked up. May need oxygen at home.   All questions/concerns addressed. Emotional support provided.   Exam: Alert, oriented. No distress. Mild shortness of breath but improved with oxygen. Breathing is regular, unlabored. Abd flat.  Plan: - Outpatient palliative to follow at home.   Hall[place], NP Palliative Medicine Team Pager [phone] (Please see [email or web address] for schedule) Team Phone [phone]    Greater than 50%  of this time was spent counseling and coordinating care related to the  above assessment and plan

## 2020-08-12 ENCOUNTER — Telehealth: Payer: Self-pay

## 2020-08-12 NOTE — Telephone Encounter (Signed)
Spoke with patient and recently seen in hospital for vaginal bleeding and Dr.Silva wanted her to return to office in 2 weeks. Made appointment for 08-25-20 4:30pm arrive at 4:00pm to register.

## 2020-08-12 NOTE — Telephone Encounter (Signed)
Patient states she saw Dr. Quincy Simmonds on Monday in the hospital. She states she was told to call for a follow up appointment.

## 2020-08-13 NOTE — Anesthesia Postprocedure Evaluation (Signed)
Anesthesia Post Note  Patient: Adrienne [name]  Procedure(s) Performed: EXAM UNDER ANESTHESIA WITH TREATMENT OF BLEEDING VAGINAL SIDEWALL LESION (N/A ) VAGINAL SIDEWALL BIOPSY (N/A )     Patient location during evaluation: PACU Anesthesia Type: General Level of consciousness: awake and alert Pain management: pain level controlled Vital Signs Assessment: post-procedure vital signs reviewed and stable Respiratory status: spontaneous breathing, nonlabored ventilation, respiratory function stable and patient connected to nasal cannula oxygen Cardiovascular status: blood pressure returned to baseline and stable Postop Assessment: no apparent nausea or vomiting Anesthetic complications: no   No complications documented.  Last Vitals:  Vitals:   08/11/20 1414 08/11/20 1504  BP:  137/65  Pulse:  66  Resp:  18  Temp:  36.6 C  SpO2: 97% 96%    Last Pain:  Vitals:   08/11/20 1504  TempSrc: Oral  PainSc:                  [name]

## 2020-08-17 ENCOUNTER — Telehealth: Payer: Self-pay

## 2020-08-17 NOTE — Telephone Encounter (Signed)
(  8:12am) SW returned call to patient's daughter-Laurie. SW provided education to her regarding palliative care/[facility]N program and benefits to patient. Margarita Grizzle agreed to services, however she she shared that her parents are concerned about having too many people in the home. She request that the team do virtual/telephonic visits only. SW advised that the team could accommodate this. SW reinforced access to palliative care team and encouraged her to call with any questions and concerns.

## 2020-08-18 ENCOUNTER — Other Ambulatory Visit: Payer: Self-pay | Admitting: Oncology

## 2020-08-18 NOTE — Progress Notes (Signed)
We have had some scheduling issues with Adrienne [name], who has been in and out of the hospital, most recently for treating of a bleeding sidewall lesion with benign pathology (MCS-2 [phone]).  We understand that she is now under palliative care.  I have changed her appointment here 08/21/2020 to a virtual appointment.

## 2020-08-18 NOTE — Discharge Summary (Signed)
Physician Discharge Summary  Patient ID: Adrienne [name] MRN: [id number] DOB/AGE: 1928/04/04 86 y.o.  Admit date: 08/08/2020 Discharge date:  08/11/20 Admission Diagnoses: 1.  Vaginal bleeding. 2.  Complete uterovaginal prolapse managed with pessary use 3.  Vaginal granulation tissue.   Discharge Diagnoses:  Principal Problem:   Vaginal bleeding Active Problems:   Chronic atrial fibrillation (HCC)   Hyperlipidemia   Essential hypertension   History of stroke   Coronary artery disease due to lipid rich plaque   Malignant neoplasm of upper-inner quadrant of left breast in female, estrogen receptor negative (HCC)   Uterine prolapse   Vaginal erosion secondary to pessary use ([place])   Acute urinary retention   Chronic respiratory failure ([place])   Palliative care by specialist   Goals of care, counseling/discussion   DNR (do not resuscitate) discussion Status post exam under anesthesia and treatment of bleeding vaginal sidewall lesion, vaginal sidewall biopsy Status post transfusion of 4 units of paced red blood cells.   Discharged Condition: good  Hospital Course:  The patient was admitted on 08/08/20 and brought to the operating room by Dr. Edwinna Areola for sudden onset of heavy vaginal bleeding attributed to a vaginal sidewall mass and granulation tissue of the cervix.  The patient has complete uterovaginal prolapse and has been managed with a pessary in an outpatient setting.  She has a history of know granulation tissue which she forms from contact with her pessary.  When she uses a smaller sized pessary, she has urinary retention and urinary infection from inadequate support of her prolapse.  Her history is complicated by atrial fibrillation, stroke and breast cancer.  When her Eliquis was discontinued in the past, she had a recurrent stroke.  During the patient's hospitalization, she was transfused a total of 4 units of packed red blood cells due to the heavy bleeding and  anemia.  Her hemoglobin reached a nadir of 8.5 during her transfusion process.    The patient had a vaginal packing placed intraoperatively, and it was removed on 08/10/20 when her bleeding was noted to be minimal and her hemoglobin improved at 9.9.  Her activity was increased and she continued to have no significant increase in her bleeding.   Her Eliquis was withheld during the immediate period of active bleeding and was restarted on the morning of 08/11/20 when her hemoglobin raised further to 10.1.   She received a foley catheter which was left to gravity drainage during her hospitalization and continued at the time of discharge.  She was given Bactrim for UTI prophylaxis while in the hospital.    Several teams consulted for the patient during her hospitalization including the hospitalist team, cardiology, occupational and physical therapy, and palliative care.   She was evaluated for some shortness of breath and had an EKG showing atrial fibrillation and an EKG showing no acute findings.  She had scarring of the left base, no edema or airspace opacity, and no adenopathy.  Her heart was the upper normal size.  Aortic atherosclerosis was also seen.   She has a known diagnosis of multiple pulmonary nodules.  She was started on O2 therapy which she will continue as an outpatient.   Consults:  Several teams consulted for the patient during her hospitalization including the hospitalist team, cardiology, occupational and physical therapy, and palliative care.    Significant Diagnostic Studies:  See Hospital Course.  Treatments: surgery:  exam under anesthesia and treatment of bleeding vaginal sidewall lesion, vaginal sidewall biopsy.  Status  post transfusion of 4 units of paced red blood cells.   Discharge Exam: Blood pressure 137/65, pulse 66, temperature 97.9 F (36.6 C), temperature source Oral, resp. rate 18, height 5' (1.524 m), weight 66.2 kg, last menstrual period 09/12/1973, SpO2 96  %. General: alert and cooperative Resp: clear to auscultation bilaterally Cardio: regular rate and rhythm, S1, S2 normal, no murmur, click, rub or gallop GI: soft, non-tender; bowel sounds normal; no masses,  no organomegaly Extremities: no edema, redness or tenderness in the calves or thighs and  PAS on right leg and off left leg (Foot pain). Vaginal Bleeding: minimal  Disposition: Discharge disposition: 01-Home or Self Care      Bleeding precautions were given to the patient prior to her discharge.   She will call 911 for heavy vaginal bleeding, chest pain, or shortness of breath.   She has a walker and a shower chair available for use.   Allergies as of 08/11/2020      Reactions   Valium [diazepam] Other (See Comments)   HYPERACTIVITY   Amiodarone Hcl [amiodarone] Other (See Comments)   AFIB   Compazine [prochlorperazine] Other (See Comments)   HYPERACTIVITY    Other Other (See Comments)   "Kidney dye"--patient states this gave her severe shakes/chills   Thorazine [chlorpromazine] Other (See Comments)   HYPERACTIVITY    Zometa [zoledronic Acid] Other (See Comments)   PROBLEMS WITH EYES TIA   Ciprofloxacin Swelling   "swelling" in Rt.elbow      Medication List    TAKE these medications   acetaminophen 500 MG tablet Commonly known as: TYLENOL Take 500-1,000 mg by mouth every 12 (twelve) hours as needed (pain).   albuterol 108 (90 Base) MCG/ACT inhaler Commonly known as: Ventolin HFA Inhale 1 puff into the lungs every 6 (six) hours as needed for wheezing or shortness of breath (Cough).   atorvastatin 10 MG tablet Commonly known as: LIPITOR TAKE 1 TABLET DAILY   diltiazem 60 MG tablet Commonly known as: CARDIZEM TAKE 1 TABLET THREE TIMES A DAY   Eliquis 5 MG Tabs tablet Generic drug: apixaban TAKE 1 TABLET TWICE A DAY   furosemide 20 MG tablet Commonly known as: LASIX Take 1 tablet (20 mg total) by mouth daily.   iron polysaccharides 150 MG  capsule Commonly known as: NIFEREX Take 1 capsule (150 mg total) by mouth daily.   metoprolol tartrate 100 MG tablet Commonly known as: LOPRESSOR TAKE 1 TABLET TWICE A DAY   multivitamin with minerals Tabs tablet Take 1 tablet by mouth daily. Centrum for Women 50+   nitrofurantoin 100 MG capsule Commonly known as: MACRODANTIN Take 100 mg by mouth at bedtime.   omeprazole 10 MG capsule Commonly known as: PRILOSEC Take 10 mg by mouth daily before breakfast.   SM CALCIUM CITRATE+D3 PETITE PO Take 1 tablet by mouth in the morning, at noon, and at bedtime.   UP4 PROBIOTICS WOMENS PO Take 1 capsule by mouth daily.   vitamin C with rose hips 500 MG tablet Take 500 mg by mouth in the morning and at bedtime.       Follow-up Information    Jerline Pain, MD Follow up in 3 week(s).   Specialty: Cardiology Why: We will arrange for follow-up and contact you. Contact information: [address] [address] [phone]        [facility] Follow up.   Why: home health  Contact information: [address]  [place] [address] [phone]        Nunzio Cobbs, MD Follow up in 2 week(s).   Specialty: Obstetrics and Gynecology Contact information: [address] [place] [address] [phone]               Signed: Arloa Koh 08/18/2020, 6:04 PM

## 2020-08-20 ENCOUNTER — Ambulatory Visit: Payer: Medicare Other | Admitting: Cardiology

## 2020-08-20 NOTE — Progress Notes (Signed)
[facility]  [phone] [phone]     ID: Yanci Bachtell DOB: 07-Aug-1928  MR#: [id number]  NUU#:[id number]  Patient Care Team: Kathyrn Lass, MD as PCP - General (Family Medicine) Jerline Pain, MD as PCP - Cardiology (Cardiology) Yisroel Ramming, Everardo All, MD as Consulting Physician (Obstetrics and Gynecology) Magrinat, Virgie Dad, MD as Consulting Physician (Oncology) Jerline Pain, MD as Consulting Physician (Cardiology) Sydnee Levans, MD as Referring Physician (Dermatology) Garner Nash, DO as Consulting Physician (Pulmonary Disease) Rolm Bookbinder, MD as Consulting Physician (General Surgery) Gery Pray, MD as Consulting Physician (Radiation Oncology) Chauncey Cruel, MD OTHER MD:   CHIEF COMPLAINT: Bilateral breast cancer,  estrogen receptor positive (s/p right mastectomy)  CURRENT TREATMENT: Observation   INTERVAL HISTORY: Pheobe was scheduled for a virtual visit today but she preferred to come in person.  She came with her husband.  Since her last visit, she underwent chest CT on 06/29/2020 for follow up of a lung nodule. This showed: previous nodules in posterior right upper lobe and in lateral right middle lobe no longer identified; unchanged nodules in right lung base and middle lobe; no new nodules seen; 7.1 cm collection in left breast; radiation changes to left breast and anterior left lung.  She proceeded to left diagnostic mammography with tomography and left breast ultrasonography at [facility] on 07/31/2020 to further evaluate the collection. This showed: breast density category C; complex fluid collection at left breast lumpectomy site favored to represent a retracting hematoma; no evidence of malignancy or infection.  She was referred to the ED on 08/08/2020 by Dr. Sabra Heck with complaints of vaginal bleeding. She was found to have a bleeding vaginal sidewall lesion.  She tells me she required 4  units of blood.  Biopsy was performed the day, and pathology i shows only granulation tissue.  The cause is felt to be her pessary.  This has been removed and she currently has a Foley in place which is causing her some minor irritation.   REVIEW OF SYSTEMS: Aside from the above Sabinal feels "back to normal" and is very busy with holiday activities.  Her bleeding has stopped.  A detailed review of systems is otherwise stable.   COVID 19 VACCINATION STATUS: Status post Pfizer x2+ booster October 2021   RIGHT BREAST CANCER HISTORY: From the original intake note:  Vetta Couzens has a right breast mass noted by her primary care doctor in [place] in 2012.  She was referred to surgery and underwent right mastectomy and axillary lymph node dissection or sampling (we do not have the pathology report) 06/04/2011 for what appears to have been a multicentric invasive ductal carcinoma, grade 2.  There were three separate tumor foci, spanning a total of 3.8 cm. Prognostic indicators were: ER positive, PR positive, and Her2 negative. One out of 3 biopsied lymph nodes was positive for micrometastasis.   LEFT BREAST CANCER HISTORY: Jayne had routine left screening mammography on 11/19/2019 showing a possible abnormality. She underwent left diagnostic mammography with tomography and left breast ultrasonography at [facility] on 12/05/2019 showing: breast density category C; 0.7 cm left breast mass at 11 o'clock; development of three left breast cysts compared to prior imaging from [place] in 11/2017; no left axillary adenopathy.  Accordingly on 12/10/2019 she proceeded to biopsy of the left breast area in question. The pathology from this procedure ([id number]) showed: invasive ductal carcinoma, grade 3; ductal carcinoma in situ. Prognostic indicators significant for:  estrogen receptor, 0% negative and progesterone receptor, 0% negative. Proliferation marker Ki67 at 90%. HER2 negative by  immunohistochemistry (0).  The patient's subsequent history is as detailed below.   PAST MEDICAL HISTORY: Past Medical History:  Diagnosis Date  . Afib ([place])   . Arthritis of ankle   . CAD (coronary artery disease)   . Cancer [facility]) 2012   breast cancer-right  . Compression fracture of T12 vertebra ([facility])   . Diverticulosis   . Edema   . Elevated uric acid in blood   . GERD (gastroesophageal reflux disease)   . GI bleed 12/2015  . Hyperlipidemia   . Hypertension   . Long-term (current) use of anticoagulants   . Moderate mitral regurgitation   . Personal history of radiation therapy   . Pulmonary HTN ([place])    Echo 1/19: EF 60-65, no RWMA, Ao sclerosis, trivial AI, mild MR, mod BAE, mod TR, PASP 49  . Stroke ([place]) 01/25/2016  . TIA (transient ischemic attack) 09/2011  . Urinary incontinence   . Urinary retention     PAST SURGICAL HISTORY: Past Surgical History:  Procedure Laterality Date  . APPENDECTOMY    . AXILLARY SENTINEL NODE BIOPSY Left 01/22/2020   Procedure: Left Axillary Sentinel Node Biopsy;  Surgeon: Rolm Bookbinder, MD;  Location: [facility];  Service: General;  Laterality: Left;  . BREAST LUMPECTOMY WITH RADIOACTIVE SEED AND SENTINEL LYMPH NODE BIOPSY Left 01/22/2020   Procedure: LEFT BREAST LUMPECTOMY WITH RADIOACTIVE SEED;  Surgeon: Rolm Bookbinder, MD;  Location: [facility];  Service: General;  Laterality: Left;  . BREAST SURGERY  2012   Rt.mastectomy--[place], [place]  2013  . CATARACT EXTRACTION, BILATERAL    . CERVICAL CONIZATION W/BX N/A 08/08/2020   Procedure: VAGINAL SIDEWALL BIOPSY;  Surgeon: Megan Salon, MD;  Location: [facility];  Service: Gynecology;  Laterality: N/A;  . DILATION AND CURETTAGE OF UTERUS     in her early 86's for DUB  . ESOPHAGOGASTRODUODENOSCOPY Left 01/08/2016   Procedure: ESOPHAGOGASTRODUODENOSCOPY (EGD);  Surgeon: Arta Silence, MD;  Location: [facility];  Service: Endoscopy;  Laterality: Left;  . GALLBLADDER  SURGERY    . MASTECTOMY Right 2012  . RADIOLOGY WITH ANESTHESIA N/A 01/25/2016   Procedure: RADIOLOGY WITH ANESTHESIA;  Surgeon: Luanne Bras, MD;  Location: [facility];  Service: Radiology;  Laterality: N/A;  . REPLACEMENT TOTAL KNEE BILATERAL    . TONSILLECTOMY AND ADENOIDECTOMY     as a child    FAMILY HISTORY: Family History  Problem Relation Age of Onset  . Asthma Mother   . CVA Father   . Atrial fibrillation Sister        HAS PACER  . Pneumonia Brother   . Cancer Daughter        COLON CANCER  . Cancer Maternal Grandmother        breast cancer   Patient's father was 44 years old when he died from stroke. Patient's mother died from coronary artery disease at age 75. The patient denies a family hx of ovarian cancer. She reports breast cancer in her maternal grandmother. She has 2 siblings, 1 brother and 1 sister. Her brother died from stroke/AIDS. She also reports pancreatic cancer in her paternal grandmother and colon cancer in her daughter.   GYNECOLOGIC HISTORY:  Patient's last menstrual period was 09/12/1973 (approximate). Menarche: 86 years old Age at first live birth: 86 years old Hel[place] P 3 LMP late 23s Contraceptive never HRT yes, remote , less than 6  months Hysterectomy?  No BSO?  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  Her husband [name] is a Marketing executive, (PhD, worked at [employer]).  Their children are Lockie Pares who lives in [place] and whose husband is a English as a second language teacher; Blair [employer]") who lives in [place] and works in finances; and [place], who lives in [place] and works for [employer].  The patient has 11 grandchildren and 8 great-grandchildren.  She attends [employer] locally    ADVANCED DIRECTIVES: In the absence of documents to the contrary the patient's husband is her healthcare power of attorney   HEALTH MAINTENANCE: Social History    Tobacco Use  . Smoking status: Former Smoker    Packs/day: 0.25    Years: 2.00    Pack years: 0.50    Types: Cigarettes    Quit date: 1950    Years since quitting: 71.9  . Smokeless tobacco: Never Used  Vaping Use  . Vaping Use: Never used  Substance Use Topics  . Alcohol use: No    Alcohol/week: 0.0 standard drinks  . Drug use: No     Colonoscopy: Remote  PAP: 11/2015, negative  Bone density: Osteopenia   Allergies  Allergen Reactions  . Valium [Diazepam] Other (See Comments)    HYPERACTIVITY   . Amiodarone Hcl [Amiodarone] Other (See Comments)    AFIB  . Compazine [Prochlorperazine] Other (See Comments)    HYPERACTIVITY   . Other Other (See Comments)    "Kidney dye"--patient states this gave her severe shakes/chills  . Thorazine [Chlorpromazine] Other (See Comments)    HYPERACTIVITY   . Zometa [Zoledronic Acid] Other (See Comments)    PROBLEMS WITH EYES  TIA  . Ciprofloxacin Swelling    "swelling" in Rt.elbow    Current Outpatient Medications  Medication Sig Dispense Refill  . acetaminophen (TYLENOL) 500 MG tablet Take 500-1,000 mg by mouth every 12 (twelve) hours as needed (pain).     Marland Kitchen albuterol (VENTOLIN HFA) 108 (90 Base) MCG/ACT inhaler Inhale 1 puff into the lungs every 6 (six) hours as needed for wheezing or shortness of breath (Cough). 18 g 2  . Ascorbic Acid (VITAMIN C WITH ROSE HIPS) 500 MG tablet Take 500 mg by mouth in the morning and at bedtime.    Marland Kitchen atorvastatin (LIPITOR) 10 MG tablet TAKE 1 TABLET DAILY 90 tablet 0  . Calcium Citrate-Vitamin D (SM CALCIUM CITRATE+D3 PETITE PO) Take 1 tablet by mouth in the morning, at noon, and at bedtime.    Marland Kitchen diltiazem (CARDIZEM) 60 MG tablet TAKE 1 TABLET THREE TIMES A DAY 270 tablet 0  . ELIQUIS 5 MG TABS tablet TAKE 1 TABLET TWICE A DAY 180 tablet 1  . furosemide (LASIX) 20 MG tablet Take 1 tablet (20 mg total) by mouth daily. 90 tablet 3  . iron polysaccharides (NIFEREX) 150 MG capsule Take 1 capsule (150  mg total) by mouth daily. 30 capsule 0  . metoprolol tartrate (LOPRESSOR) 100 MG tablet TAKE 1 TABLET TWICE A DAY 180 tablet 0  . Multiple Vitamin (MULTIVITAMIN WITH MINERALS) TABS tablet Take 1 tablet by mouth daily. Centrum for Women 50+    . nitrofurantoin (MACRODANTIN) 100 MG capsule Take 100 mg by mouth at bedtime.     Marland Kitchen omeprazole (PRILOSEC) 10 MG capsule Take 10 mg by mouth daily before breakfast.     . Probiotic Product (UP4 PROBIOTICS  WOMENS PO) Take 1 capsule by mouth daily.     No current facility-administered medications for this visit.    OBJECTIVE: White woman who appears stated age  36:   08/21/20 1116  BP: (!) 166/64  Pulse: 60  Resp: 17  Temp: 98.1 F (36.7 C)  SpO2: 99%     Body mass index is 28.49 kg/m.   Wt Readings from Last 3 Encounters:  08/21/20 145 lb 14.4 oz (66.2 kg)  08/11/20 145 lb 15.1 oz (66.2 kg)  07/15/20 145 lb 6.4 oz (66 kg)      ECOG FS: 1  Sclerae unicteric, EOMs intact Wearing a mask No cervical or supraclavicular adenopathy Lungs no rales or rhonchi Heart regular rate and rhythm Abd soft, nontender, positive bowel sounds MSK no focal spinal tenderness, no upper extremity lymphedema Neuro: nonfocal, well oriented, appropriate affect Breasts: Status post right mastectomy.  There is no evidence of chest wall recurrence.  Status post left lumpectomy and radiation.  There is minimal hyperpigmentation.  There is slight coarsening of the skin.  Overall the cosmetic result is very good.  There is no evidence of local recurrence or residual disease.  Both axillae are benign.    LAB RESULTS:  CMP     Component Value Date/Time   NA 138 08/10/2020 0238   NA 145 (H) 05/06/2019 1608   K 4.0 08/10/2020 0238   CL 106 08/10/2020 0238   CO2 26 08/10/2020 0238   GLUCOSE 89 08/10/2020 0238   BUN 14 08/10/2020 0238   BUN 19 05/06/2019 1608   CREATININE 1.18 (H) 08/10/2020 0238   CALCIUM 7.9 (L) 08/10/2020 0238   PROT 6.5 08/08/2020 0101    PROT 6.6 05/06/2019 1608   ALBUMIN 3.3 (L) 08/08/2020 0101   ALBUMIN 3.9 05/06/2019 1608   AST 17 08/08/2020 0101   ALT 14 08/08/2020 0101   ALKPHOS 79 08/08/2020 0101   BILITOT 0.7 08/08/2020 0101   BILITOT 0.4 05/06/2019 1608   GFRNONAA 44 (L) 08/10/2020 0238   GFRAA 51 (L) 05/12/2020 1240    No results found for: TOTALPROTELP, ALBUMINELP, A1GS, A2GS, BETS, BETA2SER, GAMS, MSPIKE, SPEI  No results found for: KPAFRELGTCHN, LAMBDASER, KAPLAMBRATIO  Lab Results  Component Value Date   WBC 9.7 08/11/2020   NEUTROABS 7.4 08/08/2020   HGB 10.1 (L) 08/11/2020   HCT 30.9 (L) 08/11/2020   MCV 90.9 08/11/2020   PLT 164 08/11/2020    No results found for: LABCA2  No components found for: [id number]  No results for input(s): INR in the last 168 hours.  No results found for: LABCA2  No results found for: IRS854  No results found for: OEV035  No results found for: KKX381  No results found for: CA2729  No components found for: HGQUANT  No results found for: CEA1 / No results found for: CEA1   No results found for: AFPTUMOR  No results found for: CHROMOGRNA  No results found for: HGBA, HGBA2QUANT, HGBFQUANT, HGBSQUAN (Hemoglobinopathy evaluation)   No results found for: LDH  No results found for: IRON, TIBC, IRONPCTSAT (Iron and TIBC)  No results found for: FERRITIN  Urinalysis    Component Value Date/Time   COLORURINE YELLOW 08/29/2017 2343   APPEARANCEUR CLOUDY (A) 08/29/2017 2343   LABSPEC 1.005 08/29/2017 2343   PHURINE 6.0 08/29/2017 2343   GLUCOSEU NEGATIVE 08/29/2017 2343   HGBUR SMALL (A) 08/29/2017 2343   BILIRUBINUR n 04/14/2020 [place] 08/29/2017 2343   PROTEINUR Negative 04/14/2020  [place] 08/29/2017 2343   UROBILINOGEN 0.2 04/14/2020 1705   NITRITE n 04/14/2020 1705   NITRITE NEGATIVE 08/29/2017 2343   LEUKOCYTESUR Negative 04/14/2020 1705    STUDIES: DG Chest 2 View  Result Date:  08/10/2020 CLINICAL DATA:  Shortness of breath. Cough. History of breast carcinoma EXAM: CHEST - 2 VIEW COMPARISON:  Chest CT June 29, 2020; chest radiograph May 09, 2019. FINDINGS: There is apparent scarring in the left base region. There is no edema or consolidation. Heart is upper normal in size with pulmonary vascularity normal. No adenopathy. Postoperative change left axillary region. There is aortic atherosclerosis. Degenerative change noted in each shoulder. IMPRESSION: Scarring left base. No edema or airspace opacity. Heart upper normal in size. No adenopathy by radiography. Aortic Atherosclerosis (ICD10-I70.0). Electronically Signed   By: Lowella Grip III M.D.   On: 08/10/2020 13:54   US BREAST LTD UNI LEFT INC AXILLA  Result Date: 07/31/2020 CLINICAL DATA:  86 year old female with history of left breast cancer diagnosed in March 2021 status post lumpectomy and radiation. Patient presents for evaluation of a mass seen in the left breast on recent CT scan. EXAM: DIGITAL DIAGNOSTIC LEFT MAMMOGRAM WITH TOMO ULTRASOUND LEFT BREAST COMPARISON:  Previous exam(s). ACR Breast Density Category c: The breast tissue is heterogeneously dense, which may obscure small masses. FINDINGS: Mammogram: Full field tomosynthesis and a spot 2D magnification view of the lumpectomy site were performed. There is a large oval mass at the lumpectomy site measuring approximately 5.3 cm. There is mild diffuse skin thickening consistent with post radiation change. There are no additional new findings elsewhere in the left breast. On physical exam, I palpate firmness throughout the upper inner aspect of the left breast. There is no erythema or skin breakdown. Ultrasound: Targeted ultrasound is performed in the left breast at 12 o'clock 3 cm from nipple at the site of the patient's lumpectomy scar demonstrating an oval circumscribed complex mass with fluid components as well as fibrinous material, likely a retracting  hematoma. No internal vascularity. IMPRESSION: At the left breast lumpectomy site there is a complex fluid collection favored to represent a retracting hematoma. No mammographic or sonographic evidence of malignancy. No imaging or clinical signs of infection. RECOMMENDATION: Return for routine diagnostic surveillance which will be due in March 2022. I have discussed the findings and recommendations with the patient. If applicable, a reminder letter will be sent to the patient regarding the next appointment. BI-RADS CATEGORY  2: Benign. Electronically Signed   By: Audie Pinto M.D.   On: 07/31/2020 13:30   MM DIAG BREAST TOMO UNI LEFT  Result Date: 07/31/2020 CLINICAL DATA:  86 year old female with history of left breast cancer diagnosed in March 2021 status post lumpectomy and radiation. Patient presents for evaluation of a mass seen in the left breast on recent CT scan. EXAM: DIGITAL DIAGNOSTIC LEFT MAMMOGRAM WITH TOMO ULTRASOUND LEFT BREAST COMPARISON:  Previous exam(s). ACR Breast Density Category c: The breast tissue is heterogeneously dense, which may obscure small masses. FINDINGS: Mammogram: Full field tomosynthesis and a spot 2D magnification view of the lumpectomy site were performed. There is a large oval mass at the lumpectomy site measuring approximately 5.3 cm. There is mild diffuse skin thickening consistent with post radiation change. There are no additional new findings elsewhere in the left breast. On physical exam, I palpate firmness throughout the upper inner aspect of the left breast. There is no erythema or skin breakdown. Ultrasound: Targeted ultrasound is performed in  the left breast at 12 o'clock 3 cm from nipple at the site of the patient's lumpectomy scar demonstrating an oval circumscribed complex mass with fluid components as well as fibrinous material, likely a retracting hematoma. No internal vascularity. IMPRESSION: At the left breast lumpectomy site there is a complex fluid  collection favored to represent a retracting hematoma. No mammographic or sonographic evidence of malignancy. No imaging or clinical signs of infection. RECOMMENDATION: Return for routine diagnostic surveillance which will be due in March 2022. I have discussed the findings and recommendations with the patient. If applicable, a reminder letter will be sent to the patient regarding the next appointment. BI-RADS CATEGORY  2: Benign. Electronically Signed   By: Audie Pinto M.D.   On: 07/31/2020 13:30     ELIGIBLE FOR AVAILABLE RESEARCH PROTOCOL: No  ASSESSMENT: 86 y.o. [place] woman   RIGHT BREAST CANCER (1) status post right mastectomy and sentinel lymph node sampling September 2012 for what appears to have been a multifocal T1-T2 N1 (mic) invasive ductal carcinoma, estrogen and progesterone receptor positive, HER-2 not amplified  (2) completed 5 years of anastrozole 2017  LEFT BREAST CANCER (3) status post left breast upper inner quadrant biopsy 12/10/2019 for a clinical T1b N0, stage IB invasive ductal carcinoma grade 3, triple negative, with an MIB-1 of 90%  (4) status post left lumpectomy and sentinel lymph node sampling 01/22/2020 for a pT1a pN0, stage IA invasive ductal carcinoma, with negative margins  (a) ductal carcinoma in situ with necrosis also removed, all margins negative  (b) a total of 2 lymph nodes were removed  (5) adjuvant chemotherapy discussed, opted against  (6) meets criteria for genetics testing  (7) adjuvant radiation 03/02/2020 through 03/24/2020 Site Technique Total Dose (Gy) Dose per Fx (Gy) Completed Fx Beam Energies  Breast, Left: Breast_Lt 3D 42.72/42.72 2.67 16/16 6X, 15X    PLAN: Arshi did very well with her left breast surgery and radiation.  This was a triple negative tumor and she is not going to obtain any benefit from antiestrogens especially given her having taken anastrozole for 5 years previously  Accordingly as far as her breast cancer  is concerned observation is the plan.  She will see me again in April of next year.  She will have a left mammography shortly before that visit  I reassured her that what she is feeling in her left breast is expected post treatment change  She knows to call for any other issue that may develop before the next visit  Total encounter time 30 minutes.    Chauncey Cruel, MD   08/22/2020 8:26 AM Medical Oncology and Hematology [facility] [place], [place] [address] Tel. [phone]    Fax. [phone]   This document serves as a record of services personally performed by Lurline Del, MD. It was created on his behalf by Wilburn Mylar, a trained medical scribe. The creation of this record is based on the scribe's personal observations and the provider's statements to them.   I, Lurline Del MD, have reviewed the above documentation for accuracy and completeness, and I agree with the above.   *Total Encounter Time as defined by the Centers for Medicare and Medicaid Services includes, in addition to the face-to-face time of a patient visit (documented in the note above) non-face-to-face time: obtaining and reviewing outside history, ordering and reviewing medications, tests or procedures, care coordination (communications with other health care professionals or caregivers) and documentation in the medical record.

## 2020-08-21 ENCOUNTER — Telehealth: Payer: Self-pay | Admitting: Oncology

## 2020-08-21 ENCOUNTER — Inpatient Hospital Stay: Payer: Medicare Other | Attending: Oncology | Admitting: Oncology

## 2020-08-21 VITALS — BP 166/64 | HR 60 | Temp 98.1°F | Resp 17 | Ht 60.0 in | Wt 145.9 lb

## 2020-08-21 DIAGNOSIS — M8580 Other specified disorders of bone density and structure, unspecified site: Secondary | ICD-10-CM | POA: Diagnosis not present

## 2020-08-21 DIAGNOSIS — T8389XS Other specified complication of genitourinary prosthetic devices, implants and grafts, sequela: Secondary | ICD-10-CM | POA: Diagnosis not present

## 2020-08-21 DIAGNOSIS — Z171 Estrogen receptor negative status [ER-]: Secondary | ICD-10-CM | POA: Diagnosis not present

## 2020-08-21 DIAGNOSIS — C50811 Malignant neoplasm of overlapping sites of right female breast: Secondary | ICD-10-CM | POA: Diagnosis not present

## 2020-08-21 DIAGNOSIS — I2510 Atherosclerotic heart disease of native coronary artery without angina pectoris: Secondary | ICD-10-CM | POA: Diagnosis not present

## 2020-08-21 DIAGNOSIS — N898 Other specified noninflammatory disorders of vagina: Secondary | ICD-10-CM

## 2020-08-21 DIAGNOSIS — Z87891 Personal history of nicotine dependence: Secondary | ICD-10-CM | POA: Insufficient documentation

## 2020-08-21 DIAGNOSIS — I2583 Coronary atherosclerosis due to lipid rich plaque: Secondary | ICD-10-CM | POA: Diagnosis not present

## 2020-08-21 DIAGNOSIS — Z923 Personal history of irradiation: Secondary | ICD-10-CM | POA: Diagnosis not present

## 2020-08-21 DIAGNOSIS — C50212 Malignant neoplasm of upper-inner quadrant of left female breast: Secondary | ICD-10-CM | POA: Insufficient documentation

## 2020-08-21 DIAGNOSIS — Z853 Personal history of malignant neoplasm of breast: Secondary | ICD-10-CM | POA: Diagnosis not present

## 2020-08-21 DIAGNOSIS — Z170 Estrogen receptor positive status [ER+]: Secondary | ICD-10-CM | POA: Diagnosis not present

## 2020-08-21 NOTE — Telephone Encounter (Signed)
Scheduled apt per 12/10 los - gave patient AVS and calender -

## 2020-08-25 ENCOUNTER — Other Ambulatory Visit: Payer: Self-pay

## 2020-08-25 ENCOUNTER — Ambulatory Visit (INDEPENDENT_AMBULATORY_CARE_PROVIDER_SITE_OTHER): Payer: Medicare Other | Admitting: Obstetrics and Gynecology

## 2020-08-25 ENCOUNTER — Encounter: Payer: Self-pay | Admitting: Obstetrics and Gynecology

## 2020-08-25 VITALS — BP 130/68 | Ht 60.5 in | Wt 144.0 lb

## 2020-08-25 DIAGNOSIS — I2510 Atherosclerotic heart disease of native coronary artery without angina pectoris: Secondary | ICD-10-CM

## 2020-08-25 DIAGNOSIS — I2583 Coronary atherosclerosis due to lipid rich plaque: Secondary | ICD-10-CM | POA: Diagnosis not present

## 2020-08-25 DIAGNOSIS — N814 Uterovaginal prolapse, unspecified: Secondary | ICD-10-CM | POA: Diagnosis not present

## 2020-08-25 DIAGNOSIS — D649 Anemia, unspecified: Secondary | ICD-10-CM | POA: Diagnosis not present

## 2020-08-25 NOTE — Progress Notes (Signed)
GYNECOLOGY  VISIT   HPI: 86 y.o.   Married  Caucasian  female   [phone] with Patient's last menstrual period was 09/12/1973 (approximate).   here for 2 week hospital follow up.   Patient was hospitalized for vaginal bleeding from her pessary.  She had surgical exploration and suturing of the bleeding sidewall of the vagina versus cervix.  Her biopsy was benign granulation tissue.   She was transfused 4 units of red blood cells and her discharge hemoglobin was 10.1 on 08/11/20.  Her Eliquis was temporarily held while she was being evaluated and treated for acute bleeding.  It was restarted prior to her discharge to home. She is taking Niferex daily.   She is wearing a foley catheter because she cannot void when her pessary is out.  She uses macrodantin for UTI prophylaxis.   She is now on home O2.   She states she is not having any vaginal bleeding.  She feels rested.  The catheter is a bother but she is doing ok with it.  She does leak urine with a cough or laugh when her pessary is in place.  She needs to wear a pad due to constant leakage.  She had a palliative care consultation while she was in the hospital. They have not done a consultation at home.  She sees cardiology on 08/27/20 and urology on 08/26/20. She saw Dr. Jana Hakim last week.  She has a breast hematoma that they are monitoring.  GYNECOLOGIC HISTORY: Patient's last menstrual period was 09/12/1973 (approximate). Contraception:  PMP Menopausal hormone therapy: none Last mammogram: 07-31-20 Diag.Lt.Br.w/Lt.Br.US/At the left breast lumpectomy site there is a complex fluid collection favored to represent a retracting hematoma/Neg/density C/routine Diag.11/2020/BiRads2 Last pap smear:  03-11-19 Neg,11-25-15 Neg        OB History    Gravida  3   Para  3   Term  3   Preterm      AB      Living  3     SAB      IAB      Ectopic      Multiple      Live Births                 Patient Active  Problem List   Diagnosis Date Noted  . Palliative care by specialist   . Goals of care, counseling/discussion   . DNR (do not resuscitate) discussion   . Vaginal bleeding 08/08/2020  . Uterine prolapse 08/08/2020  . Vaginal erosion secondary to pessary use ([place]) 08/08/2020  . Acute urinary retention 08/08/2020  . Chronic respiratory failure ([place]) 08/08/2020  . Multiple pulmonary nodules 05/24/2020  . Chronic cough 05/24/2020  . Malignant neoplasm of upper-inner quadrant of left breast in female, estrogen receptor negative ([place]) 12/20/2019  . Malignant neoplasm of overlapping sites of right breast in female, estrogen receptor positive ([place]) 08/14/2019  . Osteopenia 08/14/2019  . Diplopia 01/29/2016  . GERD (gastroesophageal reflux disease) 01/29/2016  . Cerebrovascular accident (CVA) due to embolism of left middle cerebral artery ([place]) 01/29/2016  . Persistent atrial fibrillation ([place])   . Bleeding gastrointestinal   . Gait disturbance, post-stroke 01/27/2016  . Fall   . Secondary hypertension, unspecified   . Cerebral infarction due to thrombosis of left middle cerebral artery (HCC) s/p mechanical thrombectomy   . Malnutrition of moderate degree 01/09/2016  . GI bleed 01/06/2016  . Chronic atrial fibrillation ([place]) 06/10/2015  . Chronic anticoagulation 06/10/2015  . Hyperlipidemia  06/10/2015  . Essential hypertension 06/10/2015  . History of stroke 06/10/2015  . Coronary artery disease due to lipid rich plaque 06/10/2015  . Elevated uric acid in blood   . Arthritis of ankle   . Pulmonary HTN ([place])     Past Medical History:  Diagnosis Date  . Afib ([place])   . Arthritis of ankle   . CAD (coronary artery disease)   . Cancer [facility]) 2012   breast cancer-right  . Compression fracture of T12 vertebra (HCC)   . Diverticulosis   . Edema   . Elevated uric acid in blood   . GERD (gastroesophageal reflux disease)   . GI bleed 12/2015  . Hyperlipidemia   . Hypertension   .  Long-term (current) use of anticoagulants   . Moderate mitral regurgitation   . Personal history of radiation therapy   . Pulmonary HTN ([place])    Echo 1/19: EF 60-65, no RWMA, Ao sclerosis, trivial AI, mild MR, mod BAE, mod TR, PASP 49  . Stroke ([place]) 01/25/2016  . TIA (transient ischemic attack) 09/2011  . Urinary incontinence   . Urinary retention     Past Surgical History:  Procedure Laterality Date  . APPENDECTOMY    . AXILLARY SENTINEL NODE BIOPSY Left 01/22/2020   Procedure: Left Axillary Sentinel Node Biopsy;  Surgeon: Rolm Bookbinder, MD;  Location: [place];  Service: General;  Laterality: Left;  . BREAST LUMPECTOMY WITH RADIOACTIVE SEED AND SENTINEL LYMPH NODE BIOPSY Left 01/22/2020   Procedure: LEFT BREAST LUMPECTOMY WITH RADIOACTIVE SEED;  Surgeon: Rolm Bookbinder, MD;  Location: [place];  Service: General;  Laterality: Left;  . BREAST SURGERY  2012   Rt.mastectomy--[place], [place]  2013  . CATARACT EXTRACTION, BILATERAL    . CERVICAL CONIZATION W/BX N/A 08/08/2020   Procedure: VAGINAL SIDEWALL BIOPSY;  Surgeon: Megan Salon, MD;  Location: [place];  Service: Gynecology;  Laterality: N/A;  . DILATION AND CURETTAGE OF UTERUS     in her early 60's for DUB  . ESOPHAGOGASTRODUODENOSCOPY Left 01/08/2016   Procedure: ESOPHAGOGASTRODUODENOSCOPY (EGD);  Surgeon: Arta Silence, MD;  Location: [facility];  Service: Endoscopy;  Laterality: Left;  . GALLBLADDER SURGERY    . MASTECTOMY Right 2012  . RADIOLOGY WITH ANESTHESIA N/A 01/25/2016   Procedure: RADIOLOGY WITH ANESTHESIA;  Surgeon: Luanne Bras, MD;  Location: [place];  Service: Radiology;  Laterality: N/A;  . REPLACEMENT TOTAL KNEE BILATERAL    . TONSILLECTOMY AND ADENOIDECTOMY     as a child    Current Outpatient Medications  Medication Sig Dispense Refill  . acetaminophen (TYLENOL) 500 MG tablet Take 500-1,000 mg by mouth every 12 (twelve) hours as needed (pain).     Marland Kitchen albuterol (VENTOLIN  HFA) 108 (90 Base) MCG/ACT inhaler Inhale 1 puff into the lungs every 6 (six) hours as needed for wheezing or shortness of breath (Cough). 18 g 2  . amoxicillin (AMOXIL) 500 MG capsule 1 capsule for dental visits    . Ascorbic Acid (VITAMIN C WITH ROSE HIPS) 500 MG tablet Take 500 mg by mouth in the morning and at bedtime.    Marland Kitchen atorvastatin (LIPITOR) 10 MG tablet TAKE 1 TABLET DAILY 90 tablet 0  . Calcium Citrate-Vitamin D (SM CALCIUM CITRATE+D3 PETITE PO) Take 1 tablet by mouth in the morning, at noon, and at bedtime.    Marland Kitchen diltiazem (CARDIZEM) 60 MG tablet TAKE 1 TABLET THREE TIMES A DAY 270 tablet 0  . ELIQUIS 5 MG TABS tablet  TAKE 1 TABLET TWICE A DAY 180 tablet 1  . furosemide (LASIX) 20 MG tablet Take 1 tablet (20 mg total) by mouth daily. 90 tablet 3  . iron polysaccharides (NIFEREX) 150 MG capsule Take 1 capsule (150 mg total) by mouth daily. 30 capsule 0  . metoprolol tartrate (LOPRESSOR) 100 MG tablet TAKE 1 TABLET TWICE A DAY 180 tablet 0  . Multiple Vitamin (MULTIVITAMIN WITH MINERALS) TABS tablet Take 1 tablet by mouth daily. Centrum for Women 50+    . nitrofurantoin (MACRODANTIN) 100 MG capsule Take 100 mg by mouth at bedtime.     Marland Kitchen omeprazole (PRILOSEC) 10 MG capsule Take 10 mg by mouth daily before breakfast.     . Probiotic Product (UP4 PROBIOTICS WOMENS PO) Take 1 capsule by mouth daily.     No current facility-administered medications for this visit.     ALLERGIES: Valium [diazepam], Amiodarone hcl [amiodarone], Compazine [prochlorperazine], Other, Thorazine [chlorpromazine], Zometa [zoledronic acid], Ciprofloxacin, and Doxycycline  Family History  Problem Relation Age of Onset  . Asthma Mother   . CVA Father   . Atrial fibrillation Sister        HAS PACER  . Pneumonia Brother   . Cancer Daughter        COLON CANCER  . Cancer Maternal Grandmother        breast cancer    Social History   Socioeconomic History  . Marital status: Married    Spouse name: Not on file   . Number of children: Not on file  . Years of education: Not on file  . Highest education level: Not on file  Occupational History  . Not on file  Tobacco Use  . Smoking status: Former Smoker    Packs/day: 0.25    Years: 2.00    Pack years: 0.50    Types: Cigarettes    Quit date: 1950    Years since quitting: 72.0  . Smokeless tobacco: Never Used  Vaping Use  . Vaping Use: Never used  Substance and Sexual Activity  . Alcohol use: No    Alcohol/week: 0.0 standard drinks  . Drug use: No  . Sexual activity: Not Currently    Partners: Male    Birth control/protection: Post-menopausal  Other Topics Concern  . Not on file  Social History Narrative  . Not on file   Social Determinants of Health   Financial Resource Strain: Not on file  Food Insecurity: Not on file  Transportation Needs: Not on file  Physical Activity: Not on file  Stress: Not on file  Social Connections: Not on file  Intimate Partner Violence: Not on file    Review of Systems  All other systems reviewed and are negative.   PHYSICAL EXAMINATION:    BP 130/68   Ht 5' 0.5" (1.537 m)   Wt 144 lb (65.3 kg)   LMP 09/12/1973 (Approximate)   BMI 27.66 kg/m     General appearance: alert, cooperative and appears stated age  Pelvic: External genitalia:  no lesions              Urethra:  normal appearing urethra with no masses, tenderness or lesions              Bartholins and Skenes: normal                 Vagina: normal appearing vagina with normal color and discharge, no lesions              Cervix:  no lesions                Bimanual Exam:  Uterus:  normal size, contour, position, consistency, mobility, non-tender.  First degree uterine prolapse.  Suture present on left vaginal sidewall.  No bleeding noted.  Small amount of granulation tissue is present.               Adnexa: no mass, fullness, tenderness       Chaperone was present for exam.  ASSESSMENT  Complete uterovaginal prolapse.   Intolerance of pessary due to vaginal ulceration and bleeding.  Status post transfusion of 4 units of packed red blood cells. Urinary retention without pessary in place.  On Macrobid for UTI prophylaxis.  PLAN  CBC, Fe and ferritin.  Continue Niferex.  We discussed options for care:  Continuing current management with use of a catheter, vaginal hysterectomy with reconstruction procedure, colpocleisis.  We discussed the risk of urinary incontinence with surgical care unless this is addressed surgically as well. She will follow up with urology and cardiology to get input on her care.  I support her in reaching out to [facility] to understand her options for palliative care, which will increase her care plan at home.  FU in 3 weeks.   37 min  total time was spent for this patient encounter, including preparation, face-to-face counseling with the patient, coordination of care, and documentation of the encounter.

## 2020-08-26 ENCOUNTER — Telehealth: Payer: Self-pay

## 2020-08-26 DIAGNOSIS — R3914 Feeling of incomplete bladder emptying: Secondary | ICD-10-CM | POA: Diagnosis not present

## 2020-08-26 DIAGNOSIS — N3020 Other chronic cystitis without hematuria: Secondary | ICD-10-CM | POA: Diagnosis not present

## 2020-08-26 NOTE — Telephone Encounter (Signed)
Telephone call to patient to schedule palliative care visit with patient. Patient/family in agreement with home visit on 09/08/20 @1pm .

## 2020-08-27 ENCOUNTER — Ambulatory Visit: Payer: Medicare Other | Admitting: Cardiology

## 2020-08-27 ENCOUNTER — Ambulatory Visit: Payer: Medicare Other | Admitting: Obstetrics and Gynecology

## 2020-08-27 LAB — CBC
Hematocrit: 35.2 % (ref 34.0–46.6)
Hemoglobin: 12.1 g/dL (ref 11.1–15.9)
MCH: 32 pg (ref 26.6–33.0)
MCHC: 34.4 g/dL (ref 31.5–35.7)
MCV: 93 fL (ref 79–97)
Platelets: 395 10*3/uL (ref 150–450)
RBC: 3.78 x10E6/uL (ref 3.77–5.28)
RDW: 14.1 % (ref 11.7–15.4)
WBC: 9 10*3/uL (ref 3.4–10.8)

## 2020-08-27 LAB — IRON: Iron: 66 ug/dL (ref 27–139)

## 2020-08-27 LAB — FERRITIN: Ferritin: 101 ng/mL (ref 15–150)

## 2020-08-30 DIAGNOSIS — I272 Pulmonary hypertension, unspecified: Secondary | ICD-10-CM | POA: Diagnosis not present

## 2020-08-30 DIAGNOSIS — I4891 Unspecified atrial fibrillation: Secondary | ICD-10-CM | POA: Diagnosis not present

## 2020-08-30 DIAGNOSIS — K219 Gastro-esophageal reflux disease without esophagitis: Secondary | ICD-10-CM | POA: Diagnosis not present

## 2020-08-30 DIAGNOSIS — Z853 Personal history of malignant neoplasm of breast: Secondary | ICD-10-CM | POA: Diagnosis not present

## 2020-08-30 DIAGNOSIS — E7800 Pure hypercholesterolemia, unspecified: Secondary | ICD-10-CM | POA: Diagnosis not present

## 2020-08-30 DIAGNOSIS — N183 Chronic kidney disease, stage 3 unspecified: Secondary | ICD-10-CM | POA: Diagnosis not present

## 2020-08-30 DIAGNOSIS — I2510 Atherosclerotic heart disease of native coronary artery without angina pectoris: Secondary | ICD-10-CM | POA: Diagnosis not present

## 2020-08-30 DIAGNOSIS — D509 Iron deficiency anemia, unspecified: Secondary | ICD-10-CM | POA: Diagnosis not present

## 2020-08-30 DIAGNOSIS — M810 Age-related osteoporosis without current pathological fracture: Secondary | ICD-10-CM | POA: Diagnosis not present

## 2020-08-31 ENCOUNTER — Other Ambulatory Visit: Payer: Self-pay

## 2020-08-31 ENCOUNTER — Ambulatory Visit (INDEPENDENT_AMBULATORY_CARE_PROVIDER_SITE_OTHER): Payer: Medicare Other | Admitting: Cardiology

## 2020-08-31 ENCOUNTER — Encounter: Payer: Self-pay | Admitting: Cardiology

## 2020-08-31 VITALS — BP 130/60 | HR 70 | Ht 60.5 in | Wt 145.0 lb

## 2020-08-31 DIAGNOSIS — R42 Dizziness and giddiness: Secondary | ICD-10-CM

## 2020-08-31 DIAGNOSIS — I2583 Coronary atherosclerosis due to lipid rich plaque: Secondary | ICD-10-CM | POA: Diagnosis not present

## 2020-08-31 DIAGNOSIS — E785 Hyperlipidemia, unspecified: Secondary | ICD-10-CM | POA: Diagnosis not present

## 2020-08-31 DIAGNOSIS — I2510 Atherosclerotic heart disease of native coronary artery without angina pectoris: Secondary | ICD-10-CM

## 2020-08-31 DIAGNOSIS — I272 Pulmonary hypertension, unspecified: Secondary | ICD-10-CM | POA: Diagnosis not present

## 2020-08-31 DIAGNOSIS — I482 Chronic atrial fibrillation, unspecified: Secondary | ICD-10-CM | POA: Diagnosis not present

## 2020-08-31 IMAGING — MG MM PLC BREAST LOC DEV 1ST LESION INC MAMMO GUIDE*L*
7 series · 7 of 7 positions shown · non-contrast
Comparison: Previous exam(s).

CLINICAL DATA: Localization of a left breast mass

EXAM:
MAMMOGRAPHIC GUIDED RADIOACTIVE SEED LOCALIZATION OF THE LEFT BREAST

[L ML (1 of 4)]
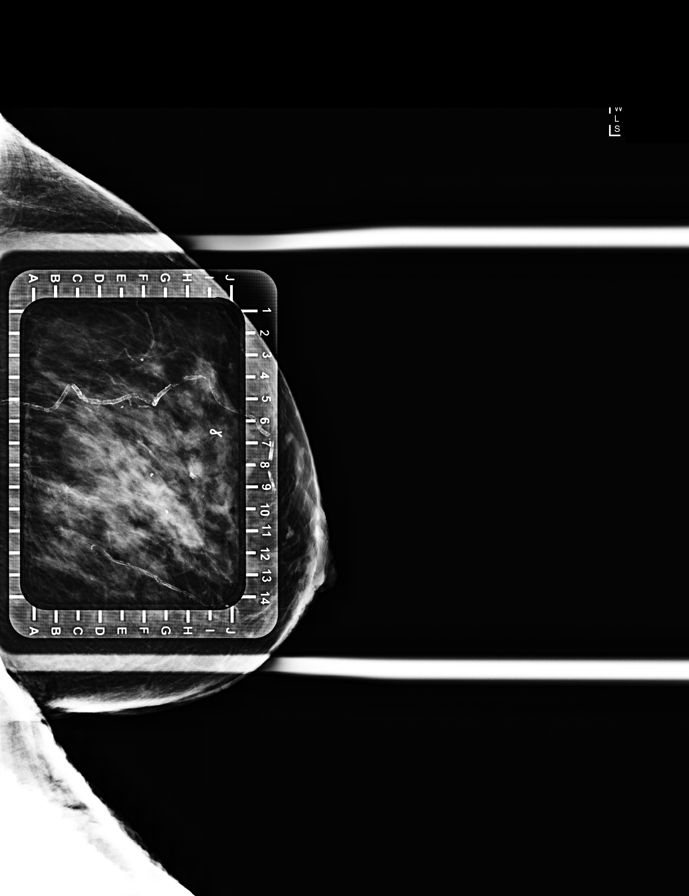

[L ML (2 of 4)]
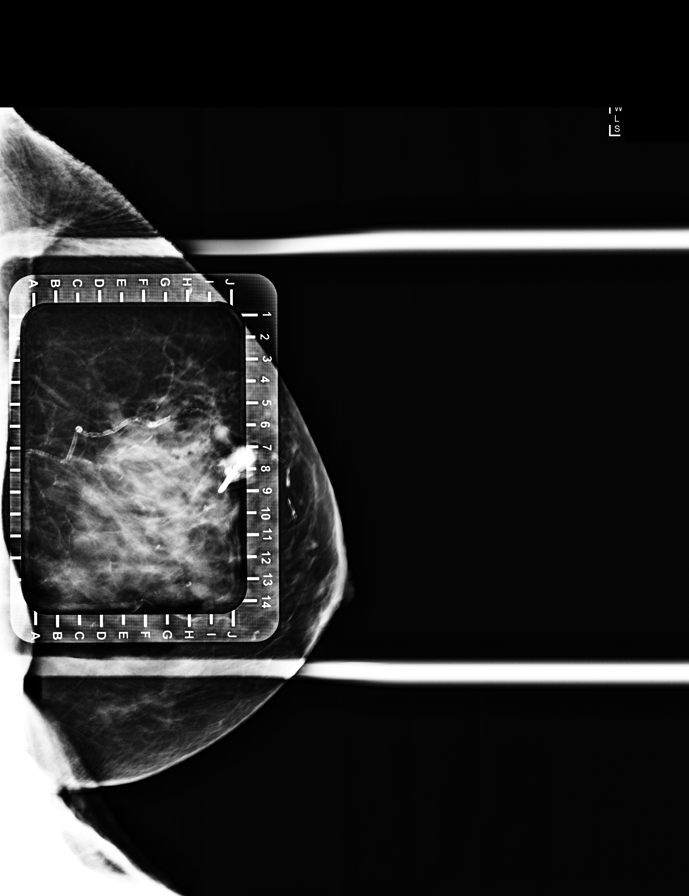

[L ML (3 of 4)]
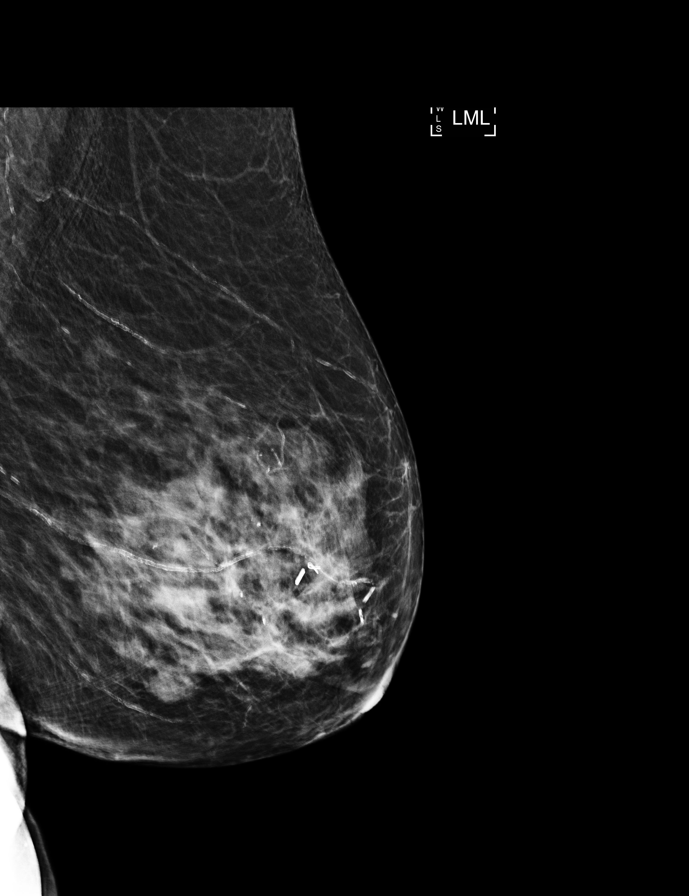

[L CC (1 of 3)]
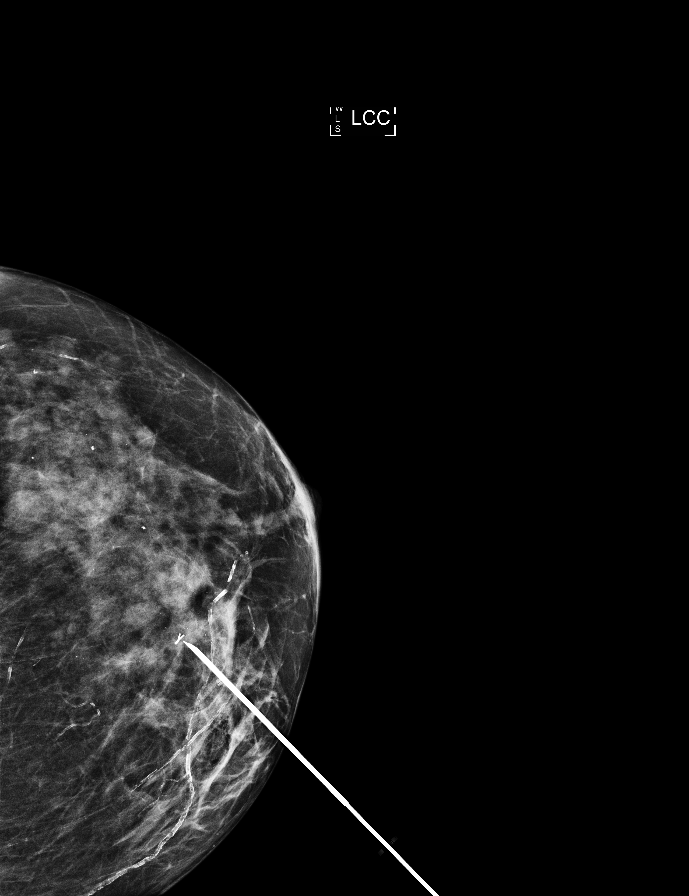

[L ML (4 of 4)]
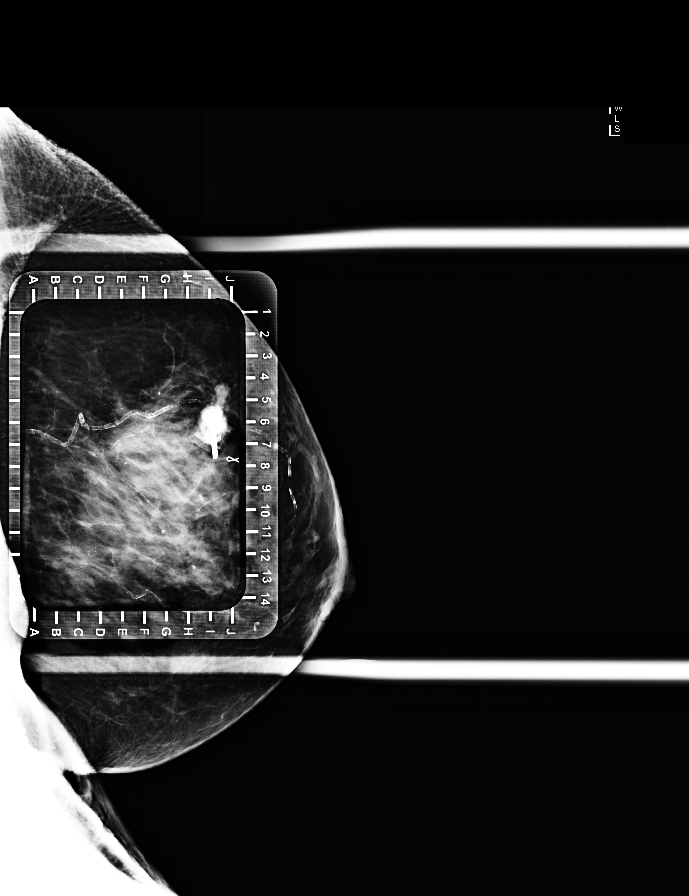

[L CC (2 of 3)]
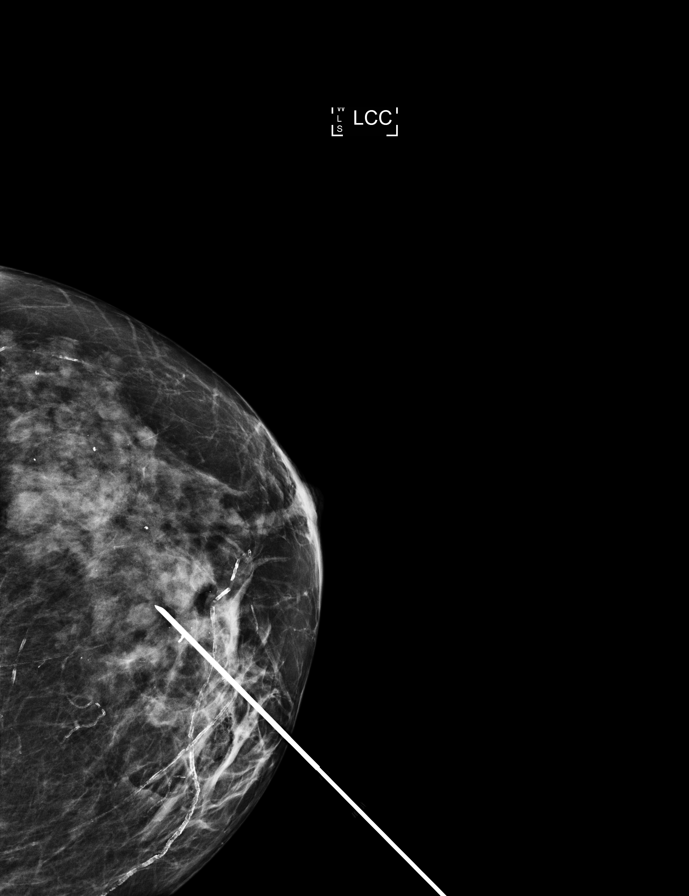

[L CC (3 of 3)]
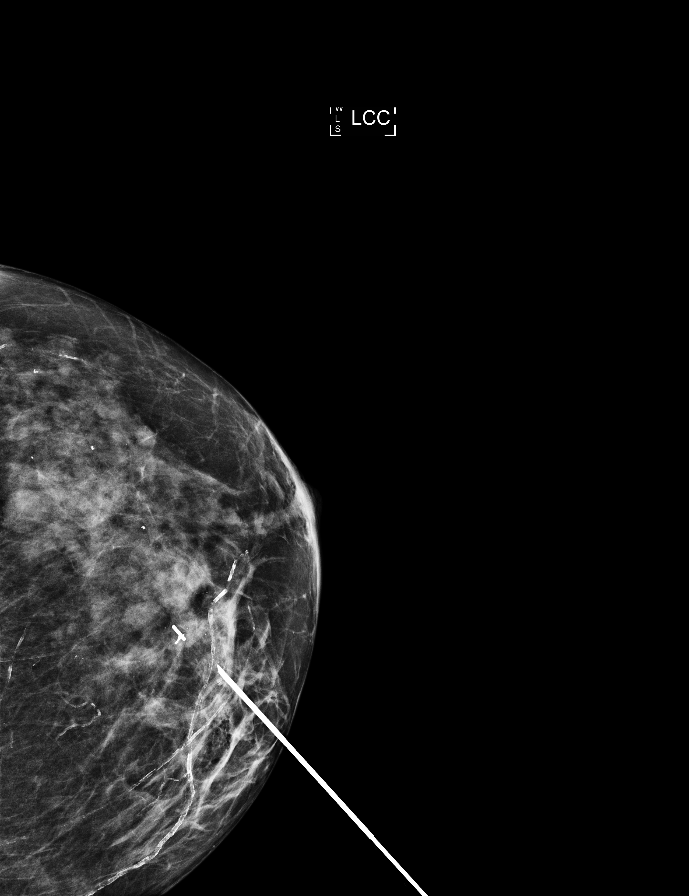

[7 of 7 positions shown; findings below may reference images not displayed]

FINDINGS: Patient presents for radioactive seed localization prior to surgery.
I met with the patient and we discussed the procedure of seed
localization including benefits and alternatives. We discussed the
high likelihood of a successful procedure. We discussed the risks of
the procedure including infection, bleeding, tissue injury and
further surgery. We discussed the low dose of radioactivity involved
in the procedure. Informed, written consent was given.

The usual time-out protocol was performed immediately prior to the
procedure.

Using mammographic guidance, sterile technique, 1% lidocaine and an
[id number] radioactive seed, the left biopsy clip was localized using a
medial approach. The follow-up mammogram images confirm the seed in
the expected location and were marked for the surgeon.

Follow-up survey of the patient confirms presence of the radioactive
seed.

Order number of [id number] seed:  [id number].

Total activity:  0.247 millicuries reference Date: January 08, 2020

The patient tolerated the procedure well and was released from the
[REDACTED]. She was given instructions regarding seed removal.
IMPRESSION: Radioactive seed localization left breast. No apparent
complications.

## 2020-08-31 MED ORDER — FUROSEMIDE 20 MG PO TABS
20.0000 mg | ORAL_TABLET | ORAL | 3 refills | Status: DC
Start: 2020-08-31 — End: 2020-09-21

## 2020-08-31 NOTE — Progress Notes (Signed)
Cardiology Office Note:    Date:  08/31/2020   ID:  Adrienne [name], DOB 11-12-1927, MRN [id number]  PCP:  Kathyrn Lass, MD  [facility] Cardiologist:  Candee Furbish, MD  [facility] Electrophysiologist:  None   Referring MD: Kathyrn Lass, MD     History of Present Illness:    Adrienne [name] is a 86 y.o. female here for the follow-up of atrial fibrillation coronary artery disease mitral regurgitation chronic anticoagulation pulmonary hypertension.  Back in 2017 had intermittent bloody stools and anticoagulation was placed on hold transiently.  GI work-up was unremarkable.  In May 2017 she had right-sided weakness was found to [place] by husband.  Coumadin was restarted about a week prior to this.  Left MCA infarct.  Went to interventional radiology and she was eventually transferred to Eliquis.  Currently under rate control strategy for atrial fibrillation, in the past I tried amiodarone unsuccessfully.  Prior right breast cancer, Zometa 2013  Prior coronary artery disease was on catheterization in 2013 that she was nonobstructive.  Echo 2016 showed moderate mitral regurgitation  Had vaginal bleed corrected with surgery. Received blood. Last Thursday woke up with dizziness. Off and on. Now better.   Past Medical History:  Diagnosis Date  . Afib ([place])   . Arthritis of ankle   . CAD (coronary artery disease)   . Cancer [facility]) 2012   breast cancer-right  . Compression fracture of T12 vertebra (HCC)   . Diverticulosis   . Edema   . Elevated uric acid in blood   . GERD (gastroesophageal reflux disease)   . GI bleed 12/2015  . Hyperlipidemia   . Hypertension   . Long-term (current) use of anticoagulants   . Moderate mitral regurgitation   . Personal history of radiation therapy   . Pulmonary HTN ([place])    Echo 1/19: EF 60-65, no RWMA, Ao sclerosis, trivial AI, mild MR, mod BAE, mod TR, PASP 49  . Stroke ([place]) 01/25/2016  . TIA (transient ischemic  attack) 09/2011  . Urinary incontinence   . Urinary retention     Past Surgical History:  Procedure Laterality Date  . APPENDECTOMY    . AXILLARY SENTINEL NODE BIOPSY Left 01/22/2020   Procedure: Left Axillary Sentinel Node Biopsy;  Surgeon: Rolm Bookbinder, MD;  Location: [place];  Service: General;  Laterality: Left;  . BREAST LUMPECTOMY WITH RADIOACTIVE SEED AND SENTINEL LYMPH NODE BIOPSY Left 01/22/2020   Procedure: LEFT BREAST LUMPECTOMY WITH RADIOACTIVE SEED;  Surgeon: Rolm Bookbinder, MD;  Location: [place];  Service: General;  Laterality: Left;  . BREAST SURGERY  2012   Rt.mastectomy--[place], [place]  2013  . CATARACT EXTRACTION, BILATERAL    . CERVICAL CONIZATION W/BX N/A 08/08/2020   Procedure: VAGINAL SIDEWALL BIOPSY;  Surgeon: Megan Salon, MD;  Location: [place];  Service: Gynecology;  Laterality: N/A;  . DILATION AND CURETTAGE OF UTERUS     in her early 25's for DUB  . ESOPHAGOGASTRODUODENOSCOPY Left 01/08/2016   Procedure: ESOPHAGOGASTRODUODENOSCOPY (EGD);  Surgeon: Arta Silence, MD;  Location: [facility];  Service: Endoscopy;  Laterality: Left;  . GALLBLADDER SURGERY    . MASTECTOMY Right 2012  . RADIOLOGY WITH ANESTHESIA N/A 01/25/2016   Procedure: RADIOLOGY WITH ANESTHESIA;  Surgeon: Luanne Bras, MD;  Location: [place];  Service: Radiology;  Laterality: N/A;  . REPLACEMENT TOTAL KNEE BILATERAL    . TONSILLECTOMY AND ADENOIDECTOMY     as a child    Current Medications: Current  Meds  Medication Sig  . acetaminophen (TYLENOL) 500 MG tablet Take 500-1,000 mg by mouth every 12 (twelve) hours as needed (pain).   Marland Kitchen albuterol (VENTOLIN HFA) 108 (90 Base) MCG/ACT inhaler Inhale 1 puff into the lungs every 6 (six) hours as needed for wheezing or shortness of breath (Cough).  Marland Kitchen amoxicillin (AMOXIL) 500 MG capsule 1 capsule for dental visits  . Ascorbic Acid (VITAMIN C WITH ROSE HIPS) 500 MG tablet Take 500 mg by mouth in the morning and at  bedtime.  Marland Kitchen atorvastatin (LIPITOR) 10 MG tablet TAKE 1 TABLET DAILY  . Calcium Citrate-Vitamin D (SM CALCIUM CITRATE+D3 PETITE PO) Take 1 tablet by mouth in the morning, at noon, and at bedtime.  Marland Kitchen diltiazem (CARDIZEM) 60 MG tablet TAKE 1 TABLET THREE TIMES A DAY  . ELIQUIS 5 MG TABS tablet TAKE 1 TABLET TWICE A DAY  . iron polysaccharides (NIFEREX) 150 MG capsule Take 1 capsule (150 mg total) by mouth daily.  . metoprolol tartrate (LOPRESSOR) 100 MG tablet TAKE 1 TABLET TWICE A DAY  . Multiple Vitamin (MULTIVITAMIN WITH MINERALS) TABS tablet Take 1 tablet by mouth daily. Centrum for Women 50+  . omeprazole (PRILOSEC) 10 MG capsule Take 10 mg by mouth daily before breakfast.   . Probiotic Product (UP4 PROBIOTICS WOMENS PO) Take 1 capsule by mouth daily.  . [DISCONTINUED] furosemide (LASIX) 20 MG tablet Take 1 tablet (20 mg total) by mouth daily.     Allergies:   Valium [diazepam], Amiodarone hcl [amiodarone], Compazine [prochlorperazine], Other, Thorazine [chlorpromazine], Zometa [zoledronic acid], Ciprofloxacin, and Doxycycline   Social History   Socioeconomic History  . Marital status: Married    Spouse name: Not on file  . Number of children: Not on file  . Years of education: Not on file  . Highest education level: Not on file  Occupational History  . Not on file  Tobacco Use  . Smoking status: Former Smoker    Packs/day: 0.25    Years: 2.00    Pack years: 0.50    Types: Cigarettes    Quit date: 1950    Years since quitting: 72.0  . Smokeless tobacco: Never Used  Vaping Use  . Vaping Use: Never used  Substance and Sexual Activity  . Alcohol use: No    Alcohol/week: 0.0 standard drinks  . Drug use: No  . Sexual activity: Not Currently    Partners: Male    Birth control/protection: Post-menopausal  Other Topics Concern  . Not on file  Social History Narrative  . Not on file   Social Determinants of Health   Financial Resource Strain: Not on file  Food Insecurity:  Not on file  Transportation Needs: Not on file  Physical Activity: Not on file  Stress: Not on file  Social Connections: Not on file     Family History: The patient's family history includes Asthma in her mother; Atrial fibrillation in her sister; CVA in her father; Cancer in her daughter and maternal grandmother; Pneumonia in her brother.  ROS:   Please see the history of present illness.     All other systems reviewed and are negative.  EKGs/Labs/Other Studies Reviewed:    The following studies were reviewed today:   ECHO 01/26/16 - Left ventricle: The cavity size was normal. Systolic function was normal. The estimated ejection fraction was in the range of 60% to 65%. Wall motion was normal; there were no regional wall motion abnormalities. The study is not technically sufficient to allow evaluation  of LV diastolic function. - Aortic valve: Valve mobility was mildly restricted. Transvalvular velocity was within the normal range. There was no stenosis. There was trivial regurgitation. - Mitral valve: Calcified annulus. Transvalvular velocity was within the normal range. There was no evidence for stenosis. There was trivial regurgitation. - Left atrium: The atrium was moderately dilated. - Right ventricle: The cavity size was normal. Wall thickness was normal. Systolic function was normal. - Atrial septum: No defect or patent foramen ovale was identified by color flow Doppler. - Tricuspid valve: There was mild regurgitation. - Pulmonary arteries: Systolic pressure was within the normal range. PA peak pressure: 29 mm Hg (S).    Recent Labs: 08/08/2020: ALT 14 08/10/2020: BUN 14; Creatinine, Ser 1.18; Potassium 4.0; Sodium 138 08/25/2020: Hemoglobin 12.1; Platelets 395  Recent Lipid Panel    Component Value Date/Time   CHOL 128 05/06/2019 1608   TRIG 101 05/06/2019 1608   HDL 50 05/06/2019 1608   CHOLHDL 2.6 05/06/2019 1608   CHOLHDL 3.3  01/26/2016 0300   VLDL 20 01/26/2016 0300   LDLCALC 58 05/06/2019 1608     Risk Assessment/Calculations:       Physical Exam:    VS:  BP 130/60 (BP Location: Left Arm, Patient Position: Sitting, Cuff Size: Normal)   Pulse 70   Ht 5' 0.5" (1.537 m)   Wt 145 lb (65.8 kg)   LMP 09/12/1973 (Approximate)   SpO2 96%   BMI 27.85 kg/m     Wt Readings from Last 3 Encounters:  08/31/20 145 lb (65.8 kg)  08/25/20 144 lb (65.3 kg)  08/21/20 145 lb 14.4 oz (66.2 kg)     GEN:  Well nourished, well developed in no acute distress HEENT: Normal NECK: No JVD; No carotid bruits LYMPHATICS: No lymphadenopathy CARDIAC: RRR, no murmurs, rubs, gallops RESPIRATORY:  Clear to auscultation without rales, wheezing or rhonchi  ABDOMEN: Soft, non-tender, non-distended MUSCULOSKELETAL:  Chronic LE edema; No deformity  SKIN: Warm and dry NEUROLOGIC:  Alert and oriented x 3 PSYCHIATRIC:  Normal affect   ASSESSMENT:    1. Chronic atrial fibrillation ([place])   2. Hyperlipidemia, unspecified hyperlipidemia type   3. Pulmonary HTN (HCC)   4. Vertigo    PLAN:    In order of problems listed above:  Vertigo -Symptoms compatible with vertigo.  She will be seeing Dr. Sabra Heck this afternoon.  Spoke about exercises to help reestablish equilibrium with thin crystals.  Thankfully, it is getting better.  Permanent atrial fibrillation -Been in rate control since 2013.  Previously failed amiodarone.  Overall doing well.  Chronic anticoagulation -On Eliquis now.  Previously had stroke on Coumadin.  Left MCA stroke when she was off of anticoagulation secondary to gastrointestinal bleeding. -Important for [place] to try to bridge her if we ever need to stop her anticoagulation.  Hospitalization with vaginal bleeding -4 units of blood administered.  Eliquis was on hold transiently.  Now has a Foley catheter.  No longer can wear the pessary at this point.  Coronary artery disease -Nonobstructive cardiac  catheterization 2013 in [place].  Overall doing well.  Hyperlipidemia -Prior LDL 52.  On atorvastatin 40 mg once a day.  Had problems with statins in the past.  LDL in the 50s.  Wonderful.  History of stroke -During breast cancer treatment after receiving Zometa as well as during a period of cessation of Coumadin in the setting of GI bleeding.  Doing well.  Carotid artery disease -Mild in 2019 bilateral.  Medication Adjustments/Labs and Tests Ordered: Current medicines are reviewed at length with the patient today.  Concerns regarding medicines are outlined above.  No orders of the defined types were placed in this encounter.  Meds ordered this encounter  Medications  . furosemide (LASIX) 20 MG tablet    Sig: Take 1 tablet (20 mg total) by mouth every other day.    Dispense:  45 tablet    Refill:  3    Patient Instructions  Medication Instructions:  Please take Furosemide every other day. Continue all other medications as listed.  *If you need a refill on your cardiac medications before your next appointment, please call your pharmacy*  Follow-Up: At [facility], you and your health needs are our priority.  As part of our continuing mission to provide you with exceptional heart care, we have created designated Provider Care Teams.  These Care Teams include your primary Cardiologist (physician) and Advanced Practice Providers (APPs -  Physician Assistants and Nurse Practitioners) who all work together to provide you with the care you need, when you need it.  We recommend signing up for the patient portal called "MyChart".  Sign up information is provided on this After Visit Summary.  MyChart is used to connect with patients for Virtual Visits (Telemedicine).  Patients are able to view lab/test results, encounter notes, upcoming appointments, etc.  Non-urgent messages can be sent to your provider as well.   To learn more about what you can do with MyChart, go to  [email or web address].    Your next appointment:   6 month(s)  The format for your next appointment:   In Person  Provider:   Candee Furbish, MD   Thank you for choosing [facility]!!         Signed, Candee Furbish, MD  08/31/2020 2:12 PM    [place]

## 2020-08-31 NOTE — Patient Instructions (Signed)
Medication Instructions:  Please take Furosemide every other day. Continue all other medications as listed.  *If you need a refill on your cardiac medications before your next appointment, please call your pharmacy*  Follow-Up: At [facility], you and your health needs are our priority.  As part of our continuing mission to provide you with exceptional heart care, we have created designated Provider Care Teams.  These Care Teams include your primary Cardiologist (physician) and Advanced Practice Providers (APPs -  Physician Assistants and Nurse Practitioners) who all work together to provide you with the care you need, when you need it.  We recommend signing up for the patient portal called "MyChart".  Sign up information is provided on this After Visit Summary.  MyChart is used to connect with patients for Virtual Visits (Telemedicine).  Patients are able to view lab/test results, encounter notes, upcoming appointments, etc.  Non-urgent messages can be sent to your provider as well.   To learn more about what you can do with MyChart, go to [email or web address].    Your next appointment:   6 month(s)  The format for your next appointment:   In Person  Provider:   Candee Furbish, MD   Thank you for choosing [facility]!!

## 2020-09-01 ENCOUNTER — Telehealth: Payer: Self-pay | Admitting: Cardiology

## 2020-09-01 IMAGING — MG MM BREAST SURGICAL SPECIMEN
1 series · 1 of 1 positions shown · non-contrast
Comparison: 01/21/2020 and earlier

CLINICAL DATA: Status post radioactive seed localized lumpectomy of
the LEFT breast.

EXAM:
SPECIMEN RADIOGRAPH OF THE LEFT BREAST

[L]
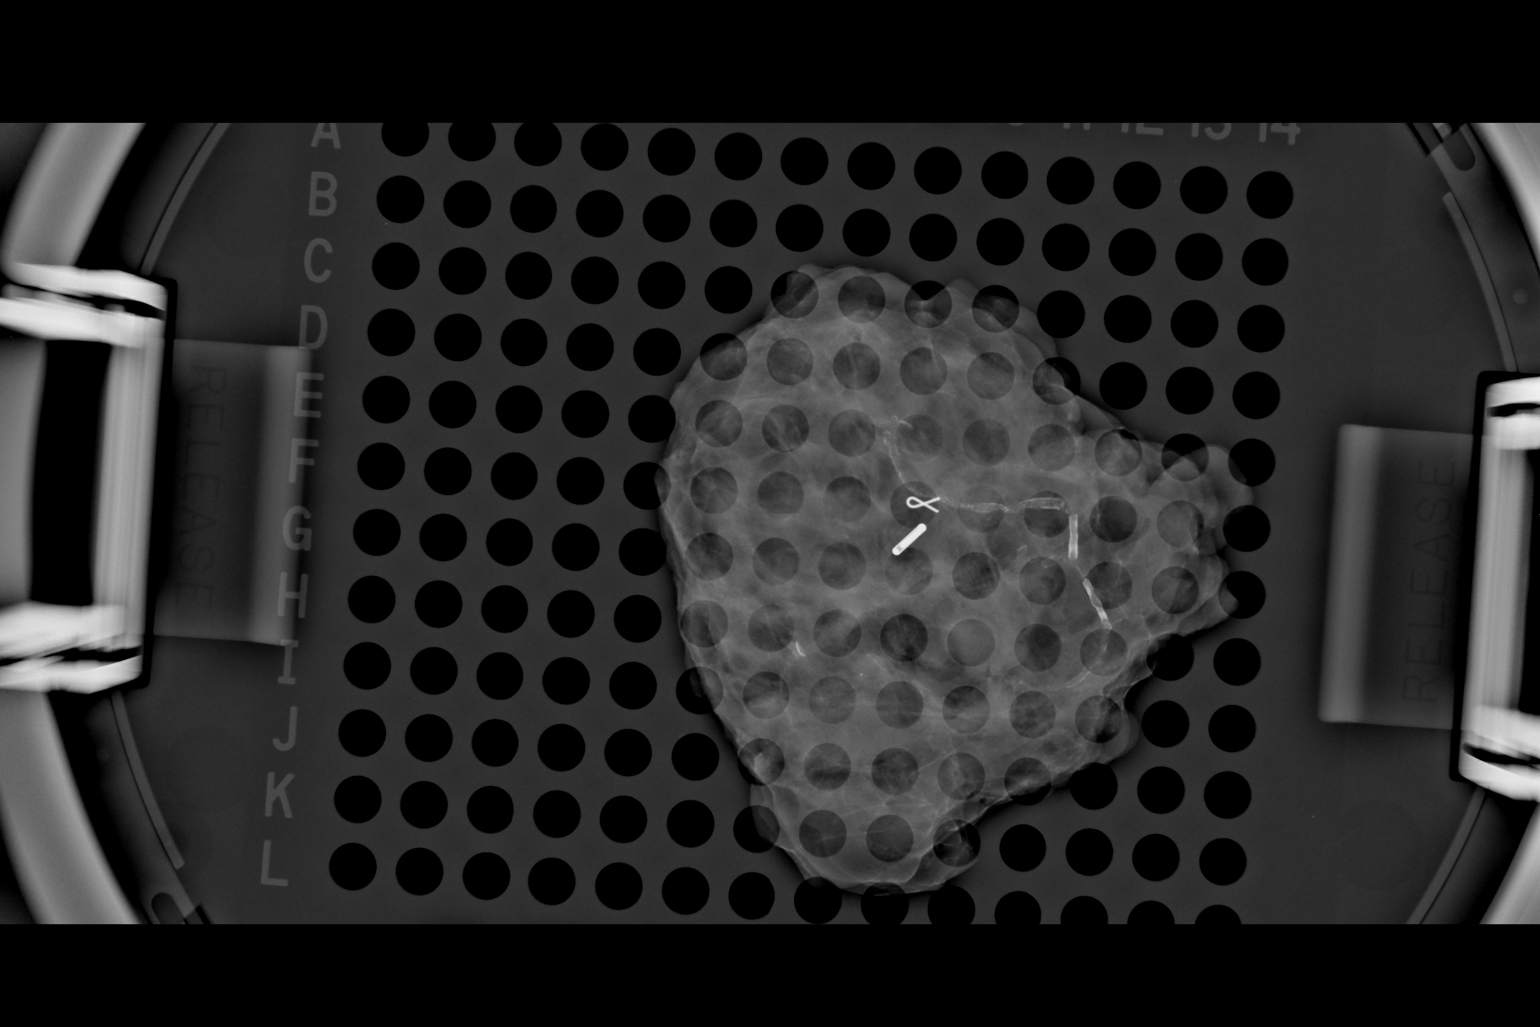

[1 of 1 positions shown; findings below may reference images not displayed]

FINDINGS: Status post excision of the left breast. The radioactive seed and
ribbon shaped biopsy marker clip are present, completely intact, and
were marked for pathology.
IMPRESSION: Specimen radiograph of the LEFT breast.

## 2020-09-01 NOTE — Telephone Encounter (Signed)
Spoke with pt who reports she received a test asking her to review her medications rxed by Dr Marlou Porch.  She reports she only received 5 scripts from Dr Marlou Porch and therefore this is confusing to her.  Advised pt since this is a new program, I am only aware patient's receive a text message when a prescription has been sent into their pharmacy.  Advised I am unaware of the specifics of the program (other than offering education about meds and savings programs) however I do know she can opt of of receiving the texts if she doesn't want them.   Patient thanked me for the call back and information.  She will call back if any other questions/concerns.

## 2020-09-01 NOTE — Telephone Encounter (Signed)
Patient said she got a Text message yesterday to verify her 7 rx from Dr. Marlou Porch.  When the patient followed the link it showed a medication she is not on.  The patient said she looked at her list and is only on 5 medications.  She tried calling her pharmacy and they did not know anything about the text. She wanted to know if Dr. Marlou Porch had any information about the text.  Patient asks that Pam call her

## 2020-09-07 ENCOUNTER — Other Ambulatory Visit: Payer: Self-pay | Admitting: Cardiology

## 2020-09-08 ENCOUNTER — Encounter (HOSPITAL_COMMUNITY): Payer: Self-pay

## 2020-09-08 ENCOUNTER — Emergency Department (HOSPITAL_COMMUNITY)
Admission: EM | Admit: 2020-09-08 | Discharge: 2020-09-09 | Disposition: A | Discharge status: Home / Self Care | Payer: Medicare Other | Attending: Emergency Medicine | Admitting: Emergency Medicine

## 2020-09-08 ENCOUNTER — Other Ambulatory Visit: Payer: Self-pay

## 2020-09-08 ENCOUNTER — Other Ambulatory Visit: Payer: Medicare Other | Admitting: *Deleted

## 2020-09-08 ENCOUNTER — Emergency Department (HOSPITAL_COMMUNITY): Payer: Medicare Other

## 2020-09-08 ENCOUNTER — Other Ambulatory Visit: Payer: Medicare Other

## 2020-09-08 VITALS — BP 150/81 | HR 78 | Temp 100.7°F | Resp 22

## 2020-09-08 DIAGNOSIS — Z5321 Procedure and treatment not carried out due to patient leaving prior to being seen by health care provider: Secondary | ICD-10-CM | POA: Insufficient documentation

## 2020-09-08 DIAGNOSIS — Z20822 Contact with and (suspected) exposure to covid-19: Secondary | ICD-10-CM | POA: Insufficient documentation

## 2020-09-08 DIAGNOSIS — Z853 Personal history of malignant neoplasm of breast: Secondary | ICD-10-CM | POA: Diagnosis not present

## 2020-09-08 DIAGNOSIS — I2510 Atherosclerotic heart disease of native coronary artery without angina pectoris: Secondary | ICD-10-CM | POA: Insufficient documentation

## 2020-09-08 DIAGNOSIS — R7989 Other specified abnormal findings of blood chemistry: Secondary | ICD-10-CM | POA: Diagnosis not present

## 2020-09-08 DIAGNOSIS — R509 Fever, unspecified: Secondary | ICD-10-CM | POA: Insufficient documentation

## 2020-09-08 DIAGNOSIS — Z87891 Personal history of nicotine dependence: Secondary | ICD-10-CM | POA: Insufficient documentation

## 2020-09-08 DIAGNOSIS — R0602 Shortness of breath: Secondary | ICD-10-CM | POA: Insufficient documentation

## 2020-09-08 DIAGNOSIS — R0989 Other specified symptoms and signs involving the circulatory and respiratory systems: Secondary | ICD-10-CM | POA: Diagnosis not present

## 2020-09-08 DIAGNOSIS — Z7901 Long term (current) use of anticoagulants: Secondary | ICD-10-CM | POA: Diagnosis not present

## 2020-09-08 DIAGNOSIS — I10 Essential (primary) hypertension: Secondary | ICD-10-CM | POA: Insufficient documentation

## 2020-09-08 DIAGNOSIS — R0600 Dyspnea, unspecified: Secondary | ICD-10-CM | POA: Diagnosis not present

## 2020-09-08 DIAGNOSIS — Z515 Encounter for palliative care: Secondary | ICD-10-CM

## 2020-09-08 DIAGNOSIS — R059 Cough, unspecified: Secondary | ICD-10-CM | POA: Diagnosis not present

## 2020-09-08 DIAGNOSIS — I4891 Unspecified atrial fibrillation: Secondary | ICD-10-CM | POA: Diagnosis not present

## 2020-09-08 DIAGNOSIS — J90 Pleural effusion, not elsewhere classified: Secondary | ICD-10-CM | POA: Diagnosis not present

## 2020-09-08 NOTE — Progress Notes (Signed)
COMMUNITY PALLIATIVE CARE SW NOTE  PATIENT NAME: Adrienne [name] DOB: 1928-05-02 MRN: [id number]  PRIMARY CARE PROVIDER: Sigmund Hazel, MD  RESPONSIBLE PARTY:  Acct ID - Guarantor Home Phone Work Phone Relationship Acct Type  [id number] DENYSE, FILLION* [phone]  Self P/F     [address], [place], [place] [address]     PLAN OF CARE and INTERVENTIONS:             1. GOALS OF CARE/ ADVANCE CARE PLANNING: Goals is for patient to remain in her home as independent as possible. Patient is a FULL CODE. 2. SOCIAL/EMOTIONAL/SPIRITUAL ASSESSMENT/ INTERVENTIONS:  SW and RN-Monishia Dimas Aguas completed a joint visit with patient at her home. Her husband-William was present with her. Patient advised that she has had a persistent cough for the past three days. SW observed that patient coughed and had runny nose throughout the visit. She had a dizzy spell five days ago and used a humidifier and sucked hard candy as directed by her physician and that helped. She will see her PCP  (Dr. Hyacinth Meeker) in January and will discuss all the changes with her at that time. Patient advised that she was taking Delsym for her cough, but ran out.The palliative care RN recommended Robitussin. Patient advised that she will be going to ED as recommended by her physician's office to get x-ray to rule out pneumonia. Patient is independent for ADL's. She has o2, but has not used it as she felt she didn't need it.o2 should be used and how to work machine. RN provided education to patient and her husband on when  She also had swelling in her left ankle. SOCIAL HISTORY: Patient was born and raised in [place], [place]. She graduated from [employer] with a degree in music education. Patient was a homemaker. She has been married to her husband-William for 68 years and they have three children. Patient is a member of [employer]. Patient has a living will and desires to be a DNR-form will be requested. SW provided supportive presence, observation,  assessment of needs and comfort of patient, coping of PCG; education of services, how to access support and reassurance of support.  Patient is receptive to ongoing support and visits by the palliative care team. 3. PATIENT/CAREGIVER EDUCATION/ COPING:  Patient and husband are coping well, but worried about patient's current status.  4. PERSONAL EMERGENCY PLAN:  911 can be activated for emergencies.  5. COMMUNITY RESOURCES COORDINATION/ HEALTH CARE NAVIGATION:  SW reinforced access to 24 hour support.  6. FINANCIAL/LEGAL CONCERNS/INTERVENTIONS:  None.      SOCIAL HX:  Social History   Tobacco Use  . Smoking status: Former Smoker    Packs/day: 0.25    Years: 2.00    Pack years: 0.50    Types: Cigarettes    Quit date: 1950    Years since quitting: 72.0  . Smokeless tobacco: Never Used  Substance Use Topics  . Alcohol use: No    Alcohol/week: 0.0 standard drinks    CODE STATUS: FULL CODE ADVANCED DIRECTIVES: Yes MOST FORM COMPLETE:  No HOSPICE EDUCATION PROVIDED: No  PPS: Patient is having SOB, coughing, runny nose and not feeling well.    Duration of visit and documentation: 60 minutes.      [address] [place], [place]

## 2020-09-08 NOTE — Progress Notes (Addendum)
COMMUNITY PALLIATIVE CARE RN NOTE  PATIENT NAME: Adrienne [name] DOB: 1927/10/05 MRN: [id number]  PRIMARY CARE PROVIDER: Kathyrn Lass, MD  RESPONSIBLE PARTY: Adrienne [name] (husband) Acct ID - Guarantor Home Phone Work Phone Relationship Acct Type  [id number] Adrienne [name]  Self P/F     [place], [place], [place] [address]    PLAN OF CARE and INTERVENTION:  1. ADVANCE CARE PLANNING/GOALS OF CARE: Goal is for patient to remain at home with her husband.  2. PATIENT/CAREGIVER EDUCATION: Explained Palliative care services, symptom management, safe mobility, dyspnea/cough management, s/s of infection 3. DISEASE STATUS: Joint visit made with Palliative care SW, Adrienne [name] for initial palliative care visit. Met with patient and her husband in their home. Patient is alert and oriented x 4. She is not feeling well today. She has a persistent non productive cough throughout visit that has been worsening over the past few days. Her husband states that she has had an intermittent cough for the past few years, but has been much worse lately. It is worse during the day, but she is usually able to sleep well during the night without coughing. She has recently run out of her cough medication (Delsym) and says that she will get more. She is short of breath today with exertion and during conversation. She is running a fever of 100.7 and feels clammy to touch. She says that she has a "funny feeling" in her head. She was experiencing some dizziness about 5 days ago. She did see her PCP and they recommended that she use a humidifier and suck on hard candy per patient's report. She does have oxygen in the home that was delivered after her recent hospitalization, but she has not used it as of yet. I demonstrated to husband how to operate the oxygen concentrator and how to place the cannula in her nose. Patient says that she spoke with her Cardiologist that recommends that she go the the ED for a  chest x-ray because he is suspecting possible fluid build-up around her heart. She does have Afib I  reiterated the importance of going for a x-ray today especially since she is now running a fever along with her cough and shortness of breath because it may also be an infection. She does have some edema noted to bilateral lower extremities, L>R. She says that she takes Lasix every other day. She used to take this daily, however it was changed due to causing some dehydration. She is independent with ADLs and ambulatory without the use of assistive devices. She is able to cook and do her laundry. She does require rest periods and it takes some time for her to perform these tasks. Denies any issues with urination or bowel movements. She says she is going to either the Urgent care or ED soon after this visit. She is agreeable to future visits from Palliative care. Will follow up with patient again tomorrow via telephone to assess how she is doing. She is appreciative of visit.   HISTORY OF PRESENT ILLNESS:  This is a 86 yo female who has a history of CVA, chronic Afib, pulmonary HTN, HLD, GERD and chronic cough. Palliative care team asked to follow patient for additional support. Will visit patient monthly and PRN.  CODE STATUS: Full code ADVANCED DIRECTIVES: Y MOST FORM: no PPS: 50%   PHYSICAL EXAM:   VITALS: Today's Vitals   09/08/20 1341  BP: (!) 150/81  Pulse: 78  Resp: (!) 22  Temp: (!) 100.7  F (38.2 C)  TempSrc: Temporal  SpO2: 95%  PainSc: 0-No pain    LUNGS: decreased breath sounds in lower lobes, shortness of breath with exertion and conversation, persistent non-productive cough CARDIAC: Cor irreg EXTREMITIES: Edema noted to bilateral lower extremities L>R SKIN: Forehead clammy to touch, no skin breakdown  NEURO: Alert and oriented x 4, generalized weakness, ambulatory   (Duration of visit and documentation 60 minutes)   Adrienne Eastern, RN BSN

## 2020-09-08 NOTE — ED Triage Notes (Signed)
Patient complains of cough x 2 months. States that she was sent from her MD for cxr. Patient denies pain. Patient has hx of a. Fib. No CP

## 2020-09-09 ENCOUNTER — Encounter (HOSPITAL_COMMUNITY): Payer: Self-pay | Admitting: Emergency Medicine

## 2020-09-09 ENCOUNTER — Other Ambulatory Visit: Payer: Self-pay

## 2020-09-09 ENCOUNTER — Emergency Department (HOSPITAL_COMMUNITY)
Admission: EM | Admit: 2020-09-09 | Discharge: 2020-09-09 | Disposition: A | Discharge status: Home / Self Care | Payer: Medicare Other | Source: Home / Self Care | Attending: Emergency Medicine | Admitting: Emergency Medicine

## 2020-09-09 DIAGNOSIS — Z8673 Personal history of transient ischemic attack (TIA), and cerebral infarction without residual deficits: Secondary | ICD-10-CM | POA: Insufficient documentation

## 2020-09-09 DIAGNOSIS — R0602 Shortness of breath: Secondary | ICD-10-CM

## 2020-09-09 DIAGNOSIS — Z96653 Presence of artificial knee joint, bilateral: Secondary | ICD-10-CM | POA: Insufficient documentation

## 2020-09-09 DIAGNOSIS — R059 Cough, unspecified: Secondary | ICD-10-CM

## 2020-09-09 DIAGNOSIS — Z853 Personal history of malignant neoplasm of breast: Secondary | ICD-10-CM | POA: Insufficient documentation

## 2020-09-09 DIAGNOSIS — R509 Fever, unspecified: Secondary | ICD-10-CM | POA: Insufficient documentation

## 2020-09-09 DIAGNOSIS — Z20822 Contact with and (suspected) exposure to covid-19: Secondary | ICD-10-CM | POA: Insufficient documentation

## 2020-09-09 DIAGNOSIS — R7989 Other specified abnormal findings of blood chemistry: Secondary | ICD-10-CM

## 2020-09-09 DIAGNOSIS — Z79899 Other long term (current) drug therapy: Secondary | ICD-10-CM | POA: Insufficient documentation

## 2020-09-09 DIAGNOSIS — I4891 Unspecified atrial fibrillation: Secondary | ICD-10-CM | POA: Insufficient documentation

## 2020-09-09 DIAGNOSIS — I2510 Atherosclerotic heart disease of native coronary artery without angina pectoris: Secondary | ICD-10-CM | POA: Insufficient documentation

## 2020-09-09 DIAGNOSIS — I10 Essential (primary) hypertension: Secondary | ICD-10-CM | POA: Insufficient documentation

## 2020-09-09 DIAGNOSIS — Z87891 Personal history of nicotine dependence: Secondary | ICD-10-CM | POA: Insufficient documentation

## 2020-09-09 DIAGNOSIS — Z7901 Long term (current) use of anticoagulants: Secondary | ICD-10-CM | POA: Insufficient documentation

## 2020-09-09 LAB — BASIC METABOLIC PANEL
Anion gap: 8 (ref 5–15)
BUN: 19 mg/dL (ref 8–23)
CO2: 28 mmol/L (ref 22–32)
Calcium: 9.3 mg/dL (ref 8.9–10.3)
Chloride: 98 mmol/L (ref 98–111)
Creatinine, Ser: 1.1 mg/dL — ABNORMAL HIGH (ref 0.44–1.00)
GFR, Estimated: 48 mL/min — ABNORMAL LOW (ref >60)
Glucose, Bld: 126 mg/dL — ABNORMAL HIGH (ref 70–99)
Potassium: 4.4 mmol/L (ref 3.5–5.1)
Sodium: 134 mmol/L — ABNORMAL LOW (ref 135–145)

## 2020-09-09 LAB — CBC
HCT: 35.6 % — ABNORMAL LOW (ref 36.0–46.0)
Hemoglobin: 11.2 g/dL — ABNORMAL LOW (ref 12.0–15.0)
MCH: 29.7 pg (ref 26.0–34.0)
MCHC: 31.5 g/dL (ref 30.0–36.0)
MCV: 94.4 fL (ref 80.0–100.0)
Platelets: 187 10*3/uL (ref 150–400)
RBC: 3.77 MIL/uL — ABNORMAL LOW (ref 3.87–5.11)
RDW: 14.4 % (ref 11.5–15.5)
WBC: 9.3 10*3/uL (ref 4.0–10.5)
nRBC: 0 % (ref 0.0–0.2)

## 2020-09-09 LAB — RESP PANEL BY RT-PCR (FLU A&B, COVID) ARPGX2
Influenza A by PCR: NEGATIVE
Influenza B by PCR: NEGATIVE
SARS Coronavirus 2 by RT PCR: NEGATIVE

## 2020-09-09 LAB — POC SARS CORONAVIRUS 2 AG -  ED: SARS Coronavirus 2 Ag: NEGATIVE

## 2020-09-09 LAB — BRAIN NATRIURETIC PEPTIDE: B Natriuretic Peptide: 456.1 pg/mL — ABNORMAL HIGH (ref 0.0–100.0)

## 2020-09-09 MED ORDER — FUROSEMIDE 10 MG/ML IJ SOLN
20.0000 mg | Freq: Once | INTRAMUSCULAR | Status: AC
  Administered 2020-09-09: 16:00:00 20 mg via INTRAVENOUS
  Filled 2020-09-09: qty 2

## 2020-09-09 NOTE — ED Provider Notes (Signed)
[facility] EMERGENCY DEPARTMENT Provider Note   CSN: [id number] Arrival date & time: 09/09/20  1037     History Chief Complaint  Patient presents with  . Shortness of Breath  . Cough    Adrienne [name] is a 86 y.o. female.  HPI 86 year old female presents with cough and shortness of breath.  She has had chronic cough for a long time but over the last 5 days it seems to be worse.  More exertional dyspnea than typical.  Also having low-grade fever, maximum temperature 100.7.  No chest pain.  Has chronic leg swelling that is unchanged.  Is currently on every other day Lasix.  Was sent here for possible CHF.  No orthopnea.  At rest she is doing okay unless she talks too much.   Past Medical History:  Diagnosis Date  . Afib ([place])   . Arthritis of ankle   . CAD (coronary artery disease)   . Cancer [facility]) 2012   breast cancer-right  . Compression fracture of T12 vertebra (HCC)   . Diverticulosis   . Edema   . Elevated uric acid in blood   . GERD (gastroesophageal reflux disease)   . GI bleed 12/2015  . Hyperlipidemia   . Hypertension   . Long-term (current) use of anticoagulants   . Moderate mitral regurgitation   . Personal history of radiation therapy   . Pulmonary HTN ([place])    Echo 1/19: EF 60-65, no RWMA, Ao sclerosis, trivial AI, mild MR, mod BAE, mod TR, PASP 49  . Stroke ([place]) 01/25/2016  . TIA (transient ischemic attack) 09/2011  . Urinary incontinence   . Urinary retention     Patient Active Problem List   Diagnosis Date Noted  . Palliative care by specialist   . Goals of care, counseling/discussion   . DNR (do not resuscitate) discussion   . Vaginal bleeding 08/08/2020  . Uterine prolapse 08/08/2020  . Vaginal erosion secondary to pessary use ([place]) 08/08/2020  . Acute urinary retention 08/08/2020  . Chronic respiratory failure ([place]) 08/08/2020  . Multiple pulmonary nodules 05/24/2020  . Chronic cough 05/24/2020  . Malignant  neoplasm of upper-inner quadrant of left breast in female, estrogen receptor negative ([place]) 12/20/2019  . Malignant neoplasm of overlapping sites of right breast in female, estrogen receptor positive ([place]) 08/14/2019  . Osteopenia 08/14/2019  . Diplopia 01/29/2016  . GERD (gastroesophageal reflux disease) 01/29/2016  . Cerebrovascular accident (CVA) due to embolism of left middle cerebral artery ([place]) 01/29/2016  . Persistent atrial fibrillation ([place])   . Bleeding gastrointestinal   . Gait disturbance, post-stroke 01/27/2016  . Fall   . Secondary hypertension, unspecified   . Cerebral infarction due to thrombosis of left middle cerebral artery (HCC) s/p mechanical thrombectomy   . Malnutrition of moderate degree 01/09/2016  . GI bleed 01/06/2016  . Chronic atrial fibrillation ([place]) 06/10/2015  . Chronic anticoagulation 06/10/2015  . Hyperlipidemia 06/10/2015  . Essential hypertension 06/10/2015  . History of stroke 06/10/2015  . Coronary artery disease due to lipid rich plaque 06/10/2015  . Elevated uric acid in blood   . Arthritis of ankle   . Pulmonary HTN ([place])     Past Surgical History:  Procedure Laterality Date  . APPENDECTOMY    . AXILLARY SENTINEL NODE BIOPSY Left 01/22/2020   Procedure: Left Axillary Sentinel Node Biopsy;  Surgeon: Rolm Bookbinder, MD;  Location: [facility];  Service: General;  Laterality: Left;  . BREAST LUMPECTOMY WITH RADIOACTIVE SEED AND SENTINEL  LYMPH NODE BIOPSY Left 01/22/2020   Procedure: LEFT BREAST LUMPECTOMY WITH RADIOACTIVE SEED;  Surgeon: Rolm Bookbinder, MD;  Location: [facility];  Service: General;  Laterality: Left;  . BREAST SURGERY  2012   Rt.mastectomy--[place], [place]  2013  . CATARACT EXTRACTION, BILATERAL    . CERVICAL CONIZATION W/BX N/A 08/08/2020   Procedure: VAGINAL SIDEWALL BIOPSY;  Surgeon: Megan Salon, MD;  Location: [facility];  Service: Gynecology;  Laterality: N/A;  . DILATION AND CURETTAGE OF UTERUS      in her early 57's for DUB  . ESOPHAGOGASTRODUODENOSCOPY Left 01/08/2016   Procedure: ESOPHAGOGASTRODUODENOSCOPY (EGD);  Surgeon: Arta Silence, MD;  Location[facility];  Service: Endoscopy;  Laterality: Left;  . GALLBLADDER SURGERY    . MASTECTOMY Right 2012  . RADIOLOGY WITH ANESTHESIA N/A 01/25/2016   Procedure: RADIOLOGY WITH ANESTHESIA;  Surgeon: Luanne Bras, MD;  Location: [facility];  Service: Radiology;  Laterality: N/A;  . REPLACEMENT TOTAL KNEE BILATERAL    . TONSILLECTOMY AND ADENOIDECTOMY     as a child     OB History    Gravida  3   Para  3   Term  3   Preterm      AB      Living  3     SAB      IAB      Ectopic      Multiple      Live Births              Family History  Problem Relation Age of Onset  . Asthma Mother   . CVA Father   . Atrial fibrillation Sister        HAS PACER  . Pneumonia Brother   . Cancer Daughter        COLON CANCER  . Cancer Maternal Grandmother        breast cancer    Social History   Tobacco Use  . Smoking status: Former Smoker    Packs/day: 0.25    Years: 2.00    Pack years: 0.50    Types: Cigarettes    Quit date: 1950    Years since quitting: 72.0  . Smokeless tobacco: Never Used  Vaping Use  . Vaping Use: Never used  Substance Use Topics  . Alcohol use: No    Alcohol/week: 0.0 standard drinks  . Drug use: No    Home Medications Prior to Admission medications   Medication Sig Start Date End Date Taking? Authorizing Provider  acetaminophen (TYLENOL) 500 MG tablet Take 500-1,000 mg by mouth every 12 (twelve) hours as needed (pain).     [provider]  albuterol (VENTOLIN HFA) 108 (90 Base) MCG/ACT inhaler Inhale 1 puff into the lungs every 6 (six) hours as needed for wheezing or shortness of breath (Cough). 05/13/20   Martyn Ehrich, NP  amoxicillin (AMOXIL) 500 MG capsule 1 capsule for dental visits    [provider]  Ascorbic Acid (VITAMIN C WITH ROSE HIPS) 500 MG tablet  Take 500 mg by mouth in the morning and at bedtime.    [provider]  atorvastatin (LIPITOR) 10 MG tablet TAKE 1 TABLET DAILY 09/07/20   Jerline Pain, MD  Calcium Citrate-Vitamin D (SM CALCIUM CITRATE+D3 PETITE PO) Take 1 tablet by mouth in the morning, at noon, and at bedtime.    [provider]  diltiazem (CARDIZEM) 60 MG tablet TAKE 1 TABLET THREE TIMES A DAY 09/07/20  Jerline Pain, MD  ELIQUIS 5 MG TABS tablet TAKE 1 TABLET TWICE A DAY 06/09/20   Jerline Pain, MD  furosemide (LASIX) 20 MG tablet Take 1 tablet (20 mg total) by mouth every other day. 08/31/20   Jerline Pain, MD  iron polysaccharides (NIFEREX) 150 MG capsule Take 1 capsule (150 mg total) by mouth daily. 08/12/20   Nunzio Cobbs, MD  metoprolol tartrate (LOPRESSOR) 100 MG tablet TAKE 1 TABLET TWICE A DAY 09/07/20   Jerline Pain, MD  Multiple Vitamin (MULTIVITAMIN WITH MINERALS) TABS tablet Take 1 tablet by mouth daily. Centrum for Women 50+    [provider]  omeprazole (PRILOSEC) 10 MG capsule Take 10 mg by mouth daily before breakfast.  03/21/19   [provider]  Probiotic Product (UP4 PROBIOTICS WOMENS PO) Take 1 capsule by mouth daily.    [provider]    Allergies    Valium [diazepam], Amiodarone hcl [amiodarone], Compazine [prochlorperazine], Other, Thorazine [chlorpromazine], Zometa [zoledronic acid], Ciprofloxacin, and Doxycycline  Review of Systems   Review of Systems  Constitutional: Positive for fever.  Respiratory: Positive for cough and shortness of breath.   Cardiovascular: Positive for leg swelling. Negative for chest pain.  All other systems reviewed and are negative.   Physical Exam Updated Vital Signs BP (!) 159/71 (BP Location: Right Arm)   Pulse 73   Temp 98.4 F (36.9 C) (Oral)   Resp 20   LMP 09/12/1973 (Approximate)   SpO2 99%   Physical Exam Vitals and nursing note reviewed.  Constitutional:      General: She is not in  acute distress.    Appearance: She is well-developed and well-nourished. She is not ill-appearing or diaphoretic.  HENT:     Head: Normocephalic and atraumatic.     Right Ear: External ear normal.     Left Ear: External ear normal.     Nose: Nose normal.  Eyes:     General:        Right eye: No discharge.        Left eye: No discharge.  Cardiovascular:     Rate and Rhythm: Normal rate. Rhythm irregular.     Heart sounds: Normal heart sounds.  Pulmonary:     Effort: Pulmonary effort is normal.     Breath sounds: Normal breath sounds.  Abdominal:     Palpations: Abdomen is soft.     Tenderness: There is no abdominal tenderness.  Musculoskeletal:     Right lower leg: Edema present.     Left lower leg: Edema present.  Skin:    General: Skin is warm and dry.  Neurological:     Mental Status: She is alert.  Psychiatric:        Mood and Affect: Mood is not anxious.     ED Results / Procedures / Treatments   Labs (all labs ordered are listed, but only abnormal results are displayed) Labs Reviewed  BASIC METABOLIC PANEL - Abnormal; Notable for the following components:      Result Value   Sodium 134 (*)    Glucose, Bld 126 (*)    Creatinine, Ser 1.10 (*)    GFR, Estimated 48 (*)    All other components within normal limits  CBC - Abnormal; Notable for the following components:   RBC 3.77 (*)    Hemoglobin 11.2 (*)    HCT 35.6 (*)    All other components within normal limits  RESP PANEL  BY RT-PCR (FLU A&B, COVID) ARPGX2  BRAIN NATRIURETIC PEPTIDE  POC SARS CORONAVIRUS 2 AG -  ED    EKG None  Radiology DG Chest 2 View  Result Date: 09/08/2020 CLINICAL DATA:  Cough for several weeks, previous tobacco abuse EXAM: CHEST - 2 VIEW COMPARISON:  08/10/2020 FINDINGS: Frontal and lateral stable enlargement of cardiac silhouette. Views of the chest demonstrates there is diffuse interstitial prominence throughout the lungs, similar to prior study. Mild central vascular  congestion and trace bilateral pleural effusions are noted. No pneumothorax. IMPRESSION: 1. Findings consistent with mild interstitial edema superimposed upon background scarring. Electronically Signed   By: Sharlet Salina M.D.   On: 09/08/2020 17:02    Procedures Procedures (including critical care time)  Medications Ordered in ED Medications  furosemide (LASIX) injection 20 mg (has no administration in time range)    ED Course  I have reviewed the triage vital signs and the nursing notes.  Pertinent labs & imaging results that were available during my care of the patient were reviewed by me and considered in my medical decision making (see chart for details).    MDM Rules/Calculators/A&P                          Patient is well-appearing.  Her O2 sats are okay and she is not having any increased work of breathing.  Chest x-ray from yesterday does show some interstitial edema and with her leg swelling she probably does have a low degree of CHF/pulmonary hypertension.  We will give IV Lasix.  COVID antigen testing is negative but given her presentation I think we need to rule out Covid and so PCR has been sent.  BNP is pending.  If she is able to ambulate without hypoxia I think she could go home, either with increasing her Lasix to daily or supportive care with COVID depending on work-up.  Care transferred to Dr. Rush Landmark. Final Clinical Impression(s) / ED Diagnoses Final diagnoses:  None    Rx / DC Orders ED Discharge Orders    None       Pricilla Loveless, MD 09/09/20 [phone]

## 2020-09-09 NOTE — ED Notes (Signed)
Ambulated pt to and from restroom with her O2 at 94% room air and heart rate at 98. Pt did voice c/o feeling a little dizzy once we where done. Nurse notified.

## 2020-09-09 NOTE — ED Provider Notes (Signed)
4:08 PM Care assumed from Dr. Criss Alvine.  At time of transfer care, patient is awaiting for results of BNP, second Covid test, and reassessment.  If the BNP is not significantly elevated and her Covid test is negative and she maintains oxygen saturations on room air, plan will be to have her increase her Lasix from once every other day to once daily and then follow-up with her cardiology this week.  Patient was in agreement with this plan with the previous team before transfer of care.  Anticipate discharge if work-up is reassuring.  5:38 PM Patient's Covid and flu test negative.  BNP more elevated than prior however when she ambulated she did not get hypoxic.  This is likely reflective of some extra fluid as the previous team suspected as well as a likely other viral URI causing her fever and cough.  I had a long discussion with patient and family and they agree with increasing the Lasix to once a day until they talk to and see the cardiologist.  She understands extremely strict return precautions for any new or worsened symptoms.  They had no other questions or concerns and was discharged in good condition.    Clinical Impression: 1. Cough   2. Shortness of breath   3. Elevated brain natriuretic peptide (BNP) level     Disposition: Discharge  Condition: Good  I have discussed the results, Dx and Tx plan with the pt(& family if present). He/she/they expressed understanding and agree(s) with the plan. Discharge instructions discussed at great length. Strict return precautions discussed and pt &/or family have verbalized understanding of the instructions. No further questions at time of discharge.    New Prescriptions   No medications on file    Follow Up: Your cardiologist and PCP     Sigmund Hazel, MD [address] [place] [address] [phone]     [facility] EMERGENCY DEPARTMENT [address] [id number] mc [place]  [address] [phone]        Demaria Deeney, Canary Brim, MD 09/09/20 1740

## 2020-09-09 NOTE — Discharge Instructions (Signed)
Your work-up today showed you were negative for Covid and influenza.  The increased BNP likely reflects extra fluid causing your shortness of breath.  Your oxygen saturations did not dip into the 80s so we feel you are safe to attempt outpatient diuresis and management.  Please take 1 fluid pill every day instead of every other day as you currently are taking.  Please call and follow-up with your cardiologist and PCP.  If any symptoms change or worsen, please return to the nearest emergency department immediately.

## 2020-09-09 NOTE — ED Triage Notes (Signed)
Patient arrives to ED with c/o of cough and SOB for months. The SOB has worsened over the past two weeks. PCP concerned for HF. Was here yesterday and LWBS. DG chest yesterday showed interstitial edema. Pt states fevers/chills starting yesterday.

## 2020-09-09 NOTE — ED Notes (Signed)
Patient verbalizes understanding of discharge instructions. Opportunity for questioning and answers were provided. Armband removed by staff, pt discharged from ED ambulatory to home.  

## 2020-09-16 NOTE — Progress Notes (Deleted)
87 y.o. G24P3003 Married Caucasian female here for annual exam.    PCP:     Patient's last menstrual period was 09/12/1973 (approximate).           Sexually active: {yes no:314532}  The current method of family planning is post menopausal status.    Exercising: {yes no:314532}  {types:19826} Smoker:  no  Health Maintenance: Pap: 03-11-19 Neg:Neg HR HPV, 11-25-15 Neg  History of abnormal Pap:  no MMG: 07-31-20 Diag.Lt.w/Lt/Br.US--Neg/density C/BiRads2(Hx of Lt.Br.CA 11/2019) Colonoscopy:  *** BMD:   ***  Result  *** TDaP:  *** Gardasil:   {YES NO:22349} HIV: Hep C: Screening Labs:  Hb today: ***, Urine today: ***   reports that she quit smoking about 72 years ago. Her smoking use included cigarettes. She has a 0.50 pack-year smoking history. She has never used smokeless tobacco. She reports that she does not drink alcohol and does not use drugs.  Past Medical History:  Diagnosis Date  . Afib ([place])   . Arthritis of ankle   . CAD (coronary artery disease)   . Cancer [facility] 2012   breast cancer-right  . Compression fracture of T12 vertebra (HCC)   . Diverticulosis   . Edema   . Elevated uric acid in blood   . GERD (gastroesophageal reflux disease)   . GI bleed 12/2015  . Hyperlipidemia   . Hypertension   . Long-term (current) use of anticoagulants   . Moderate mitral regurgitation   . Personal history of radiation therapy   . Pulmonary HTN ([place])    Echo 1/19: EF 60-65, no RWMA, Ao sclerosis, trivial AI, mild MR, mod BAE, mod TR, PASP 49  . Stroke ([place]) 01/25/2016  . TIA (transient ischemic attack) 09/2011  . Urinary incontinence   . Urinary retention     Past Surgical History:  Procedure Laterality Date  . APPENDECTOMY    . AXILLARY SENTINEL NODE BIOPSY Left 01/22/2020   Procedure: Left Axillary Sentinel Node Biopsy;  Surgeon: Rolm Bookbinder, MD;  Location: [facility];  Service: General;  Laterality: Left;  . BREAST LUMPECTOMY WITH RADIOACTIVE SEED AND SENTINEL LYMPH NODE  BIOPSY Left 01/22/2020   Procedure: LEFT BREAST LUMPECTOMY WITH RADIOACTIVE SEED;  Surgeon: Rolm Bookbinder, MD;  Location: [facility];  Service: General;  Laterality: Left;  . BREAST SURGERY  2012   Rt.mastectomy--[place], [place]  2013  . CATARACT EXTRACTION, BILATERAL    . CERVICAL CONIZATION W/BX N/A 08/08/2020   Procedure: VAGINAL SIDEWALL BIOPSY;  Surgeon: Megan Salon, MD;  Location: [facility];  Service: Gynecology;  Laterality: N/A;  . DILATION AND CURETTAGE OF UTERUS     in her early 25's for DUB  . ESOPHAGOGASTRODUODENOSCOPY Left 01/08/2016   Procedure: ESOPHAGOGASTRODUODENOSCOPY (EGD);  Surgeon: Arta Silence, MD;  [facility];  Service: Endoscopy;  Laterality: Left;  . GALLBLADDER SURGERY    . MASTECTOMY Right 2012  . RADIOLOGY WITH ANESTHESIA N/A 01/25/2016   Procedure: RADIOLOGY WITH ANESTHESIA;  Surgeon: Luanne Bras, MD;  Location: [facility];  Service: Radiology;  Laterality: N/A;  . REPLACEMENT TOTAL KNEE BILATERAL    . TONSILLECTOMY AND ADENOIDECTOMY     as a child    Current Outpatient Medications  Medication Sig Dispense Refill  . acetaminophen (TYLENOL) 500 MG tablet Take 500-1,000 mg by mouth every 12 (twelve) hours as needed (pain).     Marland Kitchen albuterol (VENTOLIN HFA) 108 (90 Base) MCG/ACT inhaler Inhale 1 puff into the lungs every 6 (six) hours as needed  for wheezing or shortness of breath (Cough). 18 g 2  . amoxicillin (AMOXIL) 500 MG capsule 1 capsule for dental visits    . Ascorbic Acid (VITAMIN C WITH ROSE HIPS) 500 MG tablet Take 500 mg by mouth in the morning and at bedtime.    Marland Kitchen atorvastatin (LIPITOR) 10 MG tablet TAKE 1 TABLET DAILY 90 tablet 3  . Calcium Citrate-Vitamin D (SM CALCIUM CITRATE+D3 PETITE PO) Take 1 tablet by mouth in the morning, at noon, and at bedtime.    Marland Kitchen diltiazem (CARDIZEM) 60 MG tablet TAKE 1 TABLET THREE TIMES A DAY 270 tablet 3  . ELIQUIS 5 MG TABS tablet TAKE 1 TABLET TWICE A DAY 180 tablet 1  .  furosemide (LASIX) 20 MG tablet Take 1 tablet (20 mg total) by mouth every other day. 45 tablet 3  . iron polysaccharides (NIFEREX) 150 MG capsule Take 1 capsule (150 mg total) by mouth daily. 30 capsule 0  . metoprolol tartrate (LOPRESSOR) 100 MG tablet TAKE 1 TABLET TWICE A DAY 180 tablet 3  . Multiple Vitamin (MULTIVITAMIN WITH MINERALS) TABS tablet Take 1 tablet by mouth daily. Centrum for Women 50+    . omeprazole (PRILOSEC) 10 MG capsule Take 10 mg by mouth daily before breakfast.     . Probiotic Product (UP4 PROBIOTICS WOMENS PO) Take 1 capsule by mouth daily.     No current facility-administered medications for this visit.    Family History  Problem Relation Age of Onset  . Asthma Mother   . CVA Father   . Atrial fibrillation Sister        HAS PACER  . Pneumonia Brother   . Cancer Daughter        COLON CANCER  . Cancer Maternal Grandmother        breast cancer    Review of Systems  Exam:   LMP 09/12/1973 (Approximate)     General appearance: alert, cooperative and appears stated age Head: normocephalic, without obvious abnormality, atraumatic Neck: no adenopathy, supple, symmetrical, trachea midline and thyroid normal to inspection and palpation Lungs: clear to auscultation bilaterally Breasts: normal appearance, no masses or tenderness, No nipple retraction or dimpling, No nipple discharge or bleeding, No axillary adenopathy Heart: regular rate and rhythm Abdomen: soft, non-tender; no masses, no organomegaly Extremities: extremities normal, atraumatic, no cyanosis or edema Skin: skin color, texture, turgor normal. No rashes or lesions Lymph nodes: cervical, supraclavicular, and axillary nodes normal. Neurologic: grossly normal  Pelvic: External genitalia:  no lesions              No abnormal inguinal nodes palpated.              Urethra:  normal appearing urethra with no masses, tenderness or lesions              Bartholins and Skenes: normal                  Vagina: normal appearing vagina with normal color and discharge, no lesions              Cervix: no lesions              Pap taken: {yes no:314532} Bimanual Exam:  Uterus:  normal size, contour, position, consistency, mobility, non-tender              Adnexa: no mass, fullness, tenderness              Rectal exam: {yes no:314532}.  Confirms.  Anus:  normal sphincter tone, no lesions  Chaperone was present for exam.  Assessment:   Well woman visit with normal exam.   Plan: Mammogram screening discussed. Self breast awareness reviewed. Pap and HR HPV as above. Guidelines for Calcium, Vitamin D, regular exercise program including cardiovascular and weight bearing exercise.   Follow up annually and prn.   Additional counseling given.  {yes [id number]. _______ minutes face to face time of which over 50% was spent in counseling.    After visit summary provided.

## 2020-09-17 ENCOUNTER — Ambulatory Visit: Payer: Medicare Other | Admitting: Obstetrics and Gynecology

## 2020-09-21 ENCOUNTER — Telehealth: Payer: Self-pay | Admitting: Cardiology

## 2020-09-21 ENCOUNTER — Other Ambulatory Visit: Payer: Self-pay | Admitting: Nurse Practitioner

## 2020-09-21 MED ORDER — FUROSEMIDE 20 MG PO TABS: 20.0000 mg | ORAL_TABLET | Freq: Every day | ORAL | 3 refills | Status: DC

## 2020-09-21 NOTE — Telephone Encounter (Signed)
Pt c/o medication issue:  1. Name of Medication: furosemide (LASIX) 20 MG tablet  2. How are you currently taking this medication (dosage and times per day)? As prescribed.  3. Are you having a reaction (difficulty breathing--STAT)? No.  4. What is your medication issue? Patients husband is calling in stating that she was told in the ER that she should start taking this medication every day oppose to every other day as Dr. Marlou Porch prescribed. Please advise.

## 2020-09-21 NOTE — Telephone Encounter (Signed)
Recent ED notes do state for pt to increase Furosemide 20 mg daily.  Rx to be sent into pharmacy of choice.  appt scheduled for 1/19 at 11:40 am.  Pt and husband grateful for the call and assistance.

## 2020-09-22 DIAGNOSIS — R3914 Feeling of incomplete bladder emptying: Secondary | ICD-10-CM | POA: Diagnosis not present

## 2020-09-30 ENCOUNTER — Ambulatory Visit: Payer: Medicare Other | Admitting: Cardiology

## 2020-10-02 ENCOUNTER — Telehealth: Payer: Self-pay | Admitting: *Deleted

## 2020-10-02 NOTE — Telephone Encounter (Signed)
Called and spoke with patient regarding scheduling a palliative care home visit. Visit scheduled for 1/25@2p .

## 2020-10-06 ENCOUNTER — Other Ambulatory Visit: Payer: Self-pay

## 2020-10-06 ENCOUNTER — Other Ambulatory Visit: Payer: Medicare Other | Admitting: *Deleted

## 2020-10-06 DIAGNOSIS — Z515 Encounter for palliative care: Secondary | ICD-10-CM

## 2020-10-09 ENCOUNTER — Other Ambulatory Visit: Payer: Self-pay

## 2020-10-09 ENCOUNTER — Ambulatory Visit (INDEPENDENT_AMBULATORY_CARE_PROVIDER_SITE_OTHER): Payer: Medicare Other | Admitting: Obstetrics and Gynecology

## 2020-10-09 ENCOUNTER — Encounter: Payer: Self-pay | Admitting: Obstetrics and Gynecology

## 2020-10-09 VITALS — BP 158/78 | HR 70 | Resp 14 | Ht 61.5 in | Wt 144.0 lb

## 2020-10-09 DIAGNOSIS — Z01411 Encounter for gynecological examination (general) (routine) with abnormal findings: Secondary | ICD-10-CM | POA: Diagnosis not present

## 2020-10-09 DIAGNOSIS — N814 Uterovaginal prolapse, unspecified: Secondary | ICD-10-CM | POA: Diagnosis not present

## 2020-10-09 DIAGNOSIS — Z1239 Encounter for other screening for malignant neoplasm of breast: Secondary | ICD-10-CM | POA: Diagnosis not present

## 2020-10-09 NOTE — Progress Notes (Signed)
87 y.o. G50P3003 Married Caucasian female here for annual exam.    No further vaginal bleeding.   She is not using her pessary, which caused her to have vaginal laceration, bleeding, surgical repair, and transfusion.  She has an indwelling catheter and is no longer using a pessary.   She has seen urology and had her catheter changed around 09/30/19, which she does monthly.  She feels like she needs to empty her bladder once in a while, but this resolves. She had one episode of blood in the urine.   She is not currently on an abx. She is managing well with the foley with a leg bag.  She enjoys having good bladder control.  She will see her cardiologist in the future.   Married for 69 years.   PCP:   Kathyrn Lass, MD  Patient's last menstrual period was 09/12/1973 (approximate).           Sexually active: No.  The current method of family planning is post menopausal status.    Exercising: No.  The patient does not participate in regular exercise at present. Smoker:  Former smoker  Health Maintenance: Pap:  03-11-2019 negative  History of abnormal Pap:  no MMG:  07-31-20 BIRADS 2 benign  Colonoscopy:  Years ago  BMD:   years ago   Result  Osteopenia TDaP:  2016 Gardasil:   no HIV: never Hep C:n/a Screening Labs:  Hb today: PCP, Urine today: has catheter in place today   reports that she quit smoking about 72 years ago. Her smoking use included cigarettes. She has a 0.50 pack-year smoking history. She has never used smokeless tobacco. She reports that she does not drink alcohol and does not use drugs.  Past Medical History:  Diagnosis Date  . Afib ([place])   . Arthritis of ankle   . CAD (coronary artery disease)   . Cancer [facility]) 2012   breast cancer-right  . Compression fracture of T12 vertebra (HCC)   . Diverticulosis   . Edema   . Elevated uric acid in blood   . GERD (gastroesophageal reflux disease)   . GI bleed 12/2015  . Hyperlipidemia   . Hypertension   . Long-term  (current) use of anticoagulants   . Moderate mitral regurgitation   . Personal history of radiation therapy   . Pulmonary HTN ([place])    Echo 1/19: EF 60-65, no RWMA, Ao sclerosis, trivial AI, mild MR, mod BAE, mod TR, PASP 49  . Stroke ([place]) 01/25/2016  . TIA (transient ischemic attack) 09/2011  . Urinary incontinence   . Urinary retention     Past Surgical History:  Procedure Laterality Date  . APPENDECTOMY    . AXILLARY SENTINEL NODE BIOPSY Left 01/22/2020   Procedure: Left Axillary Sentinel Node Biopsy;  Surgeon: Rolm Bookbinder, MD;  Location: [place];  Service: General;  Laterality: Left;  . BREAST LUMPECTOMY WITH RADIOACTIVE SEED AND SENTINEL LYMPH NODE BIOPSY Left 01/22/2020   Procedure: LEFT BREAST LUMPECTOMY WITH RADIOACTIVE SEED;  Surgeon: Rolm Bookbinder, MD;  Location: [place];  Service: General;  Laterality: Left;  . BREAST SURGERY  2012   Rt.mastectomy--[place], [place]  2013  . CATARACT EXTRACTION, BILATERAL    . CERVICAL CONIZATION W/BX N/A 08/08/2020   Procedure: VAGINAL SIDEWALL BIOPSY;  Surgeon: Megan Salon, MD;  Location: [place];  Service: Gynecology;  Laterality: N/A;  . DILATION AND CURETTAGE OF UTERUS     in her early 55's for DUB  .  ESOPHAGOGASTRODUODENOSCOPY Left 01/08/2016   Procedure: ESOPHAGOGASTRODUODENOSCOPY (EGD);  Surgeon: Arta Silence, MD;  Location: [facility];  Service: Endoscopy;  Laterality: Left;  . GALLBLADDER SURGERY    . MASTECTOMY Right 2012  . RADIOLOGY WITH ANESTHESIA N/A 01/25/2016   Procedure: RADIOLOGY WITH ANESTHESIA;  Surgeon: Luanne Bras, MD;  Location: [place];  Service: Radiology;  Laterality: N/A;  . REPLACEMENT TOTAL KNEE BILATERAL    . TONSILLECTOMY AND ADENOIDECTOMY     as a child    Current Outpatient Medications  Medication Sig Dispense Refill  . acetaminophen (TYLENOL) 500 MG tablet Take 500-1,000 mg by mouth every 12 (twelve) hours as needed (pain).     Marland Kitchen albuterol (VENTOLIN HFA) 108 (90  Base) MCG/ACT inhaler Inhale 1 puff into the lungs every 6 (six) hours as needed for wheezing or shortness of breath (Cough). 18 g 2  . amoxicillin (AMOXIL) 500 MG capsule 1 capsule for dental visits    . Ascorbic Acid (VITAMIN C WITH ROSE HIPS) 500 MG tablet Take 500 mg by mouth in the morning and at bedtime.    Marland Kitchen atorvastatin (LIPITOR) 10 MG tablet TAKE 1 TABLET DAILY 90 tablet 3  . Calcium Citrate-Vitamin D (SM CALCIUM CITRATE+D3 PETITE PO) Take 1 tablet by mouth in the morning, at noon, and at bedtime.    Marland Kitchen diltiazem (CARDIZEM) 60 MG tablet TAKE 1 TABLET THREE TIMES A DAY 270 tablet 3  . ELIQUIS 5 MG TABS tablet TAKE 1 TABLET TWICE A DAY 180 tablet 1  . furosemide (LASIX) 20 MG tablet Take 1 tablet (20 mg total) by mouth daily. 90 tablet 3  . iron polysaccharides (NIFEREX) 150 MG capsule Take 1 capsule (150 mg total) by mouth daily. 30 capsule 0  . metoprolol tartrate (LOPRESSOR) 100 MG tablet TAKE 1 TABLET TWICE A DAY 180 tablet 3  . Multiple Vitamin (MULTIVITAMIN WITH MINERALS) TABS tablet Take 1 tablet by mouth daily. Centrum for Women 50+    . omeprazole (PRILOSEC) 10 MG capsule Take 10 mg by mouth daily before breakfast.     . Probiotic Product (UP4 PROBIOTICS WOMENS PO) Take 1 capsule by mouth daily.     No current facility-administered medications for this visit.    Family History  Problem Relation Age of Onset  . Asthma Mother   . CVA Father   . Atrial fibrillation Sister        HAS PACER  . Pneumonia Brother   . Cancer Daughter        COLON CANCER  . Cancer Maternal Grandmother        breast cancer    Review of Systems  All other systems reviewed and are negative.   Exam:   BP (!) 158/78 (BP Location: Right Arm, Patient Position: Sitting, Cuff Size: Normal)   Pulse 70   Resp 14   Ht 5' 1.5" (1.562 m)   Wt 144 lb (65.3 kg)   LMP 09/12/1973 (Approximate)   BMI 26.77 kg/m     General appearance: alert, cooperative and appears stated age  Breasts: right absent,  left with radiation changes and firmness throughout. No nipple discharge or bleeding, No axillary adenopathy bilaterally. Abdomen: soft, non-tender; no masses, no organomegaly No inguinal adenopathy.  Pelvic: External genitalia:  no lesions              No abnormal inguinal nodes palpated.              Urethra:  normal appearing urethra with no masses, tenderness  or lesions              Bartholins and Skenes: normal                 Vagina: normal appearing vagina with normal color and discharge, no lesions              Cervix: no lesions              Pap taken: No. Bimanual Exam:  Uterus:  normal size, contour, position, consistency, mobility, non-tender              Adnexa: no mass, fullness, tenderness              Rectal exam: Yes.  .  Confirms.              Anus:  normal sphincter tone, no lesions  Chaperone was present for exam.  Assessment:     Complete uterovaginal prolapse.  Intolerance of pessary due to vaginal ulceration and bleeding.  Status post transfusion of 4 units of packed red blood cells. Indwelling foley catheter. Hx right breast cancer and is status post mastectomy.  Left breast hematoma.  On Eliquis.   Plan: Mammogram dx left due in March, 2022. Self breast awareness reviewed. Pap next year.  We discussed colpocleisis +/- midurethral sling.  She will continue using the Foley with leg bag for now.  FU yearly and prn.   42 min  total time was spent for this patient encounter, including preparation, face-to-face counseling with the patient, coordination of care, and documentation of the encounter.

## 2020-10-12 DIAGNOSIS — N1831 Chronic kidney disease, stage 3a: Secondary | ICD-10-CM | POA: Diagnosis not present

## 2020-10-12 DIAGNOSIS — I4819 Other persistent atrial fibrillation: Secondary | ICD-10-CM | POA: Diagnosis not present

## 2020-10-12 DIAGNOSIS — Z978 Presence of other specified devices: Secondary | ICD-10-CM | POA: Diagnosis not present

## 2020-10-12 DIAGNOSIS — Z0000 Encounter for general adult medical examination without abnormal findings: Secondary | ICD-10-CM | POA: Diagnosis not present

## 2020-10-12 DIAGNOSIS — E663 Overweight: Secondary | ICD-10-CM | POA: Diagnosis not present

## 2020-10-12 DIAGNOSIS — N183 Chronic kidney disease, stage 3 unspecified: Secondary | ICD-10-CM | POA: Diagnosis not present

## 2020-10-12 DIAGNOSIS — Z1389 Encounter for screening for other disorder: Secondary | ICD-10-CM | POA: Diagnosis not present

## 2020-10-12 DIAGNOSIS — D649 Anemia, unspecified: Secondary | ICD-10-CM | POA: Diagnosis not present

## 2020-10-12 DIAGNOSIS — D6869 Other thrombophilia: Secondary | ICD-10-CM | POA: Diagnosis not present

## 2020-10-16 NOTE — Progress Notes (Signed)
COMMUNITY PALLIATIVE CARE RN NOTE  PATIENT NAME: Adrienne [name] DOB: [birth year] MRN: [id number]  PRIMARY CARE PROVIDER: Kathyrn Lass, MD  RESPONSIBLE PARTY:  Acct ID - Guarantor Home Phone Work Phone Relationship Acct Type  [id number] JERMAINE, [phone]  Self P/F     [address], [place], [place] [address]   Covid-19 Pre-screening Negative  PLAN OF CARE and INTERVENTION:  1. ADVANCE CARE PLANNING/GOALS OF CARE: Goal is for patient to remain at home with her husband. 2. PATIENT/CAREGIVER EDUCATION: Symptom management, edema management, safe mobility, s/s of infection 3. DISEASE STATUS: Met with patient and her husband in their home. Patient is alert and oriented x 4, pleasant and engaging. She denies pain at this time. During my last visit on 12/28, patient had a fever, cough along with shortness of breath. Covid test was negative. She had a chest x-ray and she states they found fluid around her heart from her CHF. She was given IV Lasix and is now taking it daily vs every other day. She says she has been doing much better. No coughing or shortness of breath noted today. She has oxygen in the home but has not had to utilize it. She does have edema noted to bilateral lower extremities, but improved since a month ago. She says that she was supposed to have an appointment with her PCP yesterday, however she woke up experiencing some diarrhea so her appointment was postponed. She took Pepto-Bismol which she says helped. No diarrhea today. She is unsure as to what caused this symptom. She does say that she ate a different brand of chicken noodle soup which was in a plastic container vs from a can she usually gets. She is awaiting a phone call to see when her lab draw will take place prior to her PCP appt. She has a good appetite. No dysphagia. She sleeps good at night and takes a nap during the day. She is ambulatory and able to perform ADLs independently. She has an indwelling foley  catheter which is draining clear, yellow urine. Will continue to monitor.   HISTORY OF PRESENT ILLNESS: This is a 87 yo female who has a history of CVA, chronic Afib, pulmonary HTN, HLD, GERD and chronic cough. Palliative care team continues to follow patient and will visit patient monthly and PRN.   CODE STATUS: Full code ADVANCED DIRECTIVES: Y MOST FORM: no PPS: 50%   PHYSICAL EXAM:   LUNGS: clear to auscultation  CARDIAC: Cor irreg EXTREMITIES: Edema to bilateral lower extremities (improved since a month ago) SKIN: Exposed skin is dry and intact  NEURO: Alert and oriented x 4, pleasant mood, ambulatory, independent w/ADLs   (Duration of visit and documentation 75 minutes)   Daryl Eastern, RN BSN

## 2020-10-20 DIAGNOSIS — R3914 Feeling of incomplete bladder emptying: Secondary | ICD-10-CM | POA: Diagnosis not present

## 2020-10-26 ENCOUNTER — Ambulatory Visit: Payer: Medicare Other | Admitting: Cardiology

## 2020-10-30 DIAGNOSIS — C50412 Malignant neoplasm of upper-outer quadrant of left female breast: Secondary | ICD-10-CM | POA: Diagnosis not present

## 2020-11-01 DIAGNOSIS — I272 Pulmonary hypertension, unspecified: Secondary | ICD-10-CM | POA: Diagnosis not present

## 2020-11-01 DIAGNOSIS — K219 Gastro-esophageal reflux disease without esophagitis: Secondary | ICD-10-CM | POA: Diagnosis not present

## 2020-11-01 DIAGNOSIS — Z853 Personal history of malignant neoplasm of breast: Secondary | ICD-10-CM | POA: Diagnosis not present

## 2020-11-01 DIAGNOSIS — D509 Iron deficiency anemia, unspecified: Secondary | ICD-10-CM | POA: Diagnosis not present

## 2020-11-01 DIAGNOSIS — I2510 Atherosclerotic heart disease of native coronary artery without angina pectoris: Secondary | ICD-10-CM | POA: Diagnosis not present

## 2020-11-01 DIAGNOSIS — M810 Age-related osteoporosis without current pathological fracture: Secondary | ICD-10-CM | POA: Diagnosis not present

## 2020-11-01 DIAGNOSIS — D649 Anemia, unspecified: Secondary | ICD-10-CM | POA: Diagnosis not present

## 2020-11-01 DIAGNOSIS — N183 Chronic kidney disease, stage 3 unspecified: Secondary | ICD-10-CM | POA: Diagnosis not present

## 2020-11-01 DIAGNOSIS — E7800 Pure hypercholesterolemia, unspecified: Secondary | ICD-10-CM | POA: Diagnosis not present

## 2020-11-04 ENCOUNTER — Other Ambulatory Visit: Payer: Self-pay

## 2020-11-04 ENCOUNTER — Telehealth: Payer: Self-pay

## 2020-11-04 ENCOUNTER — Ambulatory Visit (INDEPENDENT_AMBULATORY_CARE_PROVIDER_SITE_OTHER): Payer: Medicare Other | Admitting: Cardiology

## 2020-11-04 ENCOUNTER — Encounter: Payer: Self-pay | Admitting: Cardiology

## 2020-11-04 VITALS — BP 120/80 | HR 78 | Ht 61.5 in | Wt 149.0 lb

## 2020-11-04 DIAGNOSIS — I2583 Coronary atherosclerosis due to lipid rich plaque: Secondary | ICD-10-CM

## 2020-11-04 DIAGNOSIS — I2510 Atherosclerotic heart disease of native coronary artery without angina pectoris: Secondary | ICD-10-CM

## 2020-11-04 DIAGNOSIS — R3914 Feeling of incomplete bladder emptying: Secondary | ICD-10-CM | POA: Diagnosis not present

## 2020-11-04 DIAGNOSIS — I482 Chronic atrial fibrillation, unspecified: Secondary | ICD-10-CM

## 2020-11-04 DIAGNOSIS — R0602 Shortness of breath: Secondary | ICD-10-CM | POA: Diagnosis not present

## 2020-11-04 DIAGNOSIS — N3020 Other chronic cystitis without hematuria: Secondary | ICD-10-CM | POA: Diagnosis not present

## 2020-11-04 NOTE — Telephone Encounter (Signed)
(  9:47am) Palliative care SW attempted to schedule visit with patient. SW called and left a message requesting a return call.

## 2020-11-04 NOTE — Progress Notes (Signed)
Cardiology Office Note:    Date:  11/04/2020   ID:  Adrienne [name], DOB [birth year], MRN [id number]  PCP:  Kathyrn Lass, MD   [place]  Cardiologist:  Candee Furbish, MD  Advanced Practice Provider:  No care team member to display Electrophysiologist:  None       Referring MD: Kathyrn Lass, MD     History of Present Illness:    Adrienne [name] is a 87 y.o. female here for follow-up of atrial fibrillation mitral vegetation chronic anticoagulation pulmonary hypertension.  ER visit late December 2021 with cough-Lasix was utilized.  She was placed on 20 mg a day from 20 mg every other day.  Overall feeling better.  No chest pain fever chills nausea vomiting syncope bleeding.Still feels more breathless than in the past.  She wonders if this is from the masks that she needs to wear.  No chest pain.  We went over her CT scan.  We talked about pulmonary nodules.  Past Medical History:  Diagnosis Date  . Afib ([place])   . Arthritis of ankle   . CAD (coronary artery disease)   . Cancer [facility]) 2012   breast cancer-right  . Compression fracture of T12 vertebra (HCC)   . Diverticulosis   . Edema   . Elevated uric acid in blood   . GERD (gastroesophageal reflux disease)   . GI bleed 12/2015  . Hyperlipidemia   . Hypertension   . Long-term (current) use of anticoagulants   . Moderate mitral regurgitation   . Personal history of radiation therapy   . Pulmonary HTN ([place])    Echo 1/19: EF 60-65, no RWMA, Ao sclerosis, trivial AI, mild MR, mod BAE, mod TR, PASP 49  . Stroke ([place]) 01/25/2016  . TIA (transient ischemic attack) 09/2011  . Urinary incontinence   . Urinary retention     Past Surgical History:  Procedure Laterality Date  . APPENDECTOMY    . AXILLARY SENTINEL NODE BIOPSY Left 01/22/2020   Procedure: Left Axillary Sentinel Node Biopsy;  Surgeon: Rolm Bookbinder, MD;  Location: [place];  Service: General;  Laterality: Left;  . BREAST  LUMPECTOMY WITH RADIOACTIVE SEED AND SENTINEL LYMPH NODE BIOPSY Left 01/22/2020   Procedure: LEFT BREAST LUMPECTOMY WITH RADIOACTIVE SEED;  Surgeon: Rolm Bookbinder, MD;  Location: [place];  Service: General;  Laterality: Left;  . BREAST SURGERY  2012   Rt.mastectomy--[place], [place]  2013  . CATARACT EXTRACTION, BILATERAL    . CERVICAL CONIZATION W/BX N/A 08/08/2020   Procedure: VAGINAL SIDEWALL BIOPSY;  Surgeon: Megan Salon, MD;  Location: [place];  Service: Gynecology;  Laterality: N/A;  . DILATION AND CURETTAGE OF UTERUS     in her early 25's for DUB  . ESOPHAGOGASTRODUODENOSCOPY Left 01/08/2016   Procedure: ESOPHAGOGASTRODUODENOSCOPY (EGD);  Surgeon: Arta Silence, MD;  Location: [facility];  Service: Endoscopy;  Laterality: Left;  . GALLBLADDER SURGERY    . MASTECTOMY Right 2012  . RADIOLOGY WITH ANESTHESIA N/A 01/25/2016   Procedure: RADIOLOGY WITH ANESTHESIA;  Surgeon: Luanne Bras, MD;  Location: [place];  Service: Radiology;  Laterality: N/A;  . REPLACEMENT TOTAL KNEE BILATERAL    . TONSILLECTOMY AND ADENOIDECTOMY     as a child    Current Medications: Current Meds  Medication Sig  . acetaminophen (TYLENOL) 500 MG tablet Take 500-1,000 mg by mouth every 12 (twelve) hours as needed (pain).   Marland Kitchen albuterol (VENTOLIN HFA) 108 (90 Base) MCG/ACT inhaler Inhale  1 puff into the lungs every 6 (six) hours as needed for wheezing or shortness of breath (Cough).  Marland Kitchen amoxicillin (AMOXIL) 500 MG capsule 1 capsule for dental visits  . Ascorbic Acid (VITAMIN C WITH ROSE HIPS) 500 MG tablet Take 500 mg by mouth in the morning and at bedtime.  Marland Kitchen atorvastatin (LIPITOR) 10 MG tablet TAKE 1 TABLET DAILY (Patient taking differently: Take 20 mg by mouth daily.)  . Calcium Citrate-Vitamin D (SM CALCIUM CITRATE+D3 PETITE PO) Take 1 tablet by mouth in the morning, at noon, and at bedtime.  Marland Kitchen diltiazem (CARDIZEM) 60 MG tablet TAKE 1 TABLET THREE TIMES A DAY  . ELIQUIS 5 MG  TABS tablet TAKE 1 TABLET TWICE A DAY  . furosemide (LASIX) 20 MG tablet Take 1 tablet (20 mg total) by mouth daily.  . iron polysaccharides (NIFEREX) 150 MG capsule Take 1 capsule (150 mg total) by mouth daily.  . metoprolol tartrate (LOPRESSOR) 100 MG tablet TAKE 1 TABLET TWICE A DAY  . Multiple Vitamin (MULTIVITAMIN WITH MINERALS) TABS tablet Take 1 tablet by mouth daily. Centrum for Women 50+  . omeprazole (PRILOSEC) 10 MG capsule Take 10 mg by mouth daily before breakfast.   . Probiotic Product (UP4 PROBIOTICS WOMENS PO) Take 1 capsule by mouth daily.     Allergies:   Valium [diazepam], Amiodarone hcl [amiodarone], Compazine [prochlorperazine], Other, Thorazine [chlorpromazine], Zometa [zoledronic acid], Ciprofloxacin, and Doxycycline   Social History   Socioeconomic History  . Marital status: Married    Spouse name: Not on file  . Number of children: Not on file  . Years of education: Not on file  . Highest education level: Not on file  Occupational History  . Not on file  Tobacco Use  . Smoking status: Former Smoker    Packs/day: 0.25    Years: 2.00    Pack years: 0.50    Types: Cigarettes    Quit date: 1950    Years since quitting: 72.1  . Smokeless tobacco: Never Used  Vaping Use  . Vaping Use: Never used  Substance and Sexual Activity  . Alcohol use: No    Alcohol/week: 0.0 standard drinks  . Drug use: No  . Sexual activity: Not Currently    Partners: Male    Birth control/protection: Post-menopausal  Other Topics Concern  . Not on file  Social History Narrative  . Not on file   Social Determinants of Health   Financial Resource Strain: Not on file  Food Insecurity: Not on file  Transportation Needs: Not on file  Physical Activity: Not on file  Stress: Not on file  Social Connections: Not on file     Family History: The patient's family history includes Asthma in her mother; Atrial fibrillation in her sister; CVA in her father; Cancer in her daughter  and maternal grandmother; Pneumonia in her brother.  ROS:   Please see the history of present illness.     All other systems reviewed and are negative.  EKGs/Labs/Other Studies Reviewed:    The following studies were reviewed today: *ECHO 2020 1. The left ventricle has normal systolic function with an ejection  fraction of 60-65%. The cavity size was normal. Left ventricular diastolic  function could not be evaluated secondary to atrial fibrillation.  2. The right ventricle has normal systolic function. The cavity was  normal. There is no increase in right ventricular wall thickness. Right  ventricular systolic pressure is normal.  3. Left atrial size was severely dilated.  4.  There is mild mitral annular calcification present.  5. The aortic valve is tricuspid. Mild sclerosis of the aortic valve.  Aortic valve regurgitation is trivial by color flow Doppler.  6. The aorta is normal unless otherwise noted.  7. The inferior vena cava was dilated in size with >50% respiratory  variability.     Recent Labs: 08/08/2020: ALT 14 09/09/2020: B Natriuretic Peptide 456.1; BUN 19; Creatinine, Ser 1.10; Hemoglobin 11.2; Platelets 187; Potassium 4.4; Sodium 134  Recent Lipid Panel    Component Value Date/Time   CHOL 128 05/06/2019 1608   TRIG 101 05/06/2019 1608   HDL 50 05/06/2019 1608   CHOLHDL 2.6 05/06/2019 1608   CHOLHDL 3.3 01/26/2016 0300   VLDL 20 01/26/2016 0300   LDLCALC 58 05/06/2019 1608     Risk Assessment/Calculations:      Physical Exam:    VS:  BP 120/80 (BP Location: Left Arm, Patient Position: Sitting, Cuff Size: Normal)   Pulse 78   Ht 5' 1.5" (1.562 m)   Wt 149 lb (67.6 kg)   LMP 09/12/1973 (Approximate)   SpO2 94%   BMI 27.70 kg/m     Wt Readings from Last 3 Encounters:  11/04/20 149 lb (67.6 kg)  10/09/20 144 lb (65.3 kg)  09/08/20 145 lb (65.8 kg)     GEN:  Well nourished, well developed in no acute distress HEENT: Normal NECK: No JVD;  No carotid bruits LYMPHATICS: No lymphadenopathy CARDIAC: RRR, no murmurs, rubs, gallops RESPIRATORY:  Clear to auscultation without rales, wheezing or rhonchi  ABDOMEN: Soft, non-tender, non-distended MUSCULOSKELETAL:  1+ Pedal edema; No deformity  SKIN: Warm and dry NEUROLOGIC:  Alert and oriented x 3 PSYCHIATRIC:  Normal affect   ASSESSMENT:    1. Chronic atrial fibrillation ([place])   2. Coronary artery disease due to lipid rich plaque   3. SOB (shortness of breath)    PLAN:    In order of problems listed above:  Shortness of breath -We are going to check an echocardiogram since his been quite some time.  She still feels breathless.  Her prior BNP was in the 450 range.  We will repeat.  She is on Lasix 20 mg a day.  Continue.  Permanent atrial fibrillation -Excellent rate control since 2013.  Previously failed amiodarone.  Left MCA stroke -Previously occurred when she was off anticoagulation.  Always bridge her.  Coronary artery disease -Nonobstructive disease noticed on catheterization in 2013 in [place].          Medication Adjustments/Labs and Tests Ordered: Current medicines are reviewed at length with the patient today.  Concerns regarding medicines are outlined above.  Orders Placed This Encounter  Procedures  . Basic metabolic panel  . Pro b natriuretic peptide (BNP)  . ECHOCARDIOGRAM COMPLETE   No orders of the defined types were placed in this encounter.   Patient Instructions  Medication Instructions:  Your physician recommends that you continue on your current medications as directed. Please refer to the Current Medication list given to you today. *If you need a refill on your cardiac medications before your next appointment, please call your pharmacy*   Lab Work: Today: BMET, Pro BNP If you have labs (blood work) drawn today and your tests are completely normal, you will receive your results only by: Marland Kitchen MyChart Message (if you have  MyChart) OR . A paper copy in the mail If you have any lab test that is abnormal or we need to change your treatment, we  will call you to review the results.   Testing/Procedures: none   Follow-Up: At [facility], you and your health needs are our priority.  As part of our continuing mission to provide you with exceptional heart care, we have created designated Provider Care Teams.  These Care Teams include your primary Cardiologist (physician) and Advanced Practice Providers (APPs -  Physician Assistants and Nurse Practitioners) who all work together to provide you with the care you need, when you need it.  We recommend signing up for the patient portal called "MyChart".  Sign up information is provided on this After Visit Summary.  MyChart is used to connect with patients for Virtual Visits (Telemedicine).  Patients are able to view lab/test results, encounter notes, upcoming appointments, etc.  Non-urgent messages can be sent to your provider as well.   To learn more about what you can do with MyChart, go to [email or web address].    Your next appointment:   4 month(s)  The format for your next appointment:   In Person  Provider:   You may see Candee Furbish, MD  or one of the following Advanced Practice Providers on your designated Care Team:    Kathyrn Drown, NP         Signed, Candee Furbish, MD  11/04/2020 5:12 PM    [place]

## 2020-11-04 NOTE — Patient Instructions (Signed)
Medication Instructions:  Your physician recommends that you continue on your current medications as directed. Please refer to the Current Medication list given to you today. *If you need a refill on your cardiac medications before your next appointment, please call your pharmacy*   Lab Work: Today: BMET, Pro BNP If you have labs (blood work) drawn today and your tests are completely normal, you will receive your results only by: Marland Kitchen MyChart Message (if you have MyChart) OR . A paper copy in the mail If you have any lab test that is abnormal or we need to change your treatment, we will call you to review the results.   Testing/Procedures: none   Follow-Up: At [facility], you and your health needs are our priority.  As part of our continuing mission to provide you with exceptional heart care, we have created designated Provider Care Teams.  These Care Teams include your primary Cardiologist (physician) and Advanced Practice Providers (APPs -  Physician Assistants and Nurse Practitioners) who all work together to provide you with the care you need, when you need it.  We recommend signing up for the patient portal called "MyChart".  Sign up information is provided on this After Visit Summary.  MyChart is used to connect with patients for Virtual Visits (Telemedicine).  Patients are able to view lab/test results, encounter notes, upcoming appointments, etc.  Non-urgent messages can be sent to your provider as well.   To learn more about what you can do with MyChart, go to [email or web address].    Your next appointment:   4 month(s)  The format for your next appointment:   In Person  Provider:   You may see Candee Furbish, MD  or one of the following Advanced Practice Providers on your designated Care Team:    Kathyrn Drown, NP

## 2020-11-05 LAB — BASIC METABOLIC PANEL
BUN/Creatinine Ratio: 13 (ref 12–28)
BUN: 17 mg/dL (ref 10–36)
CO2: 25 mmol/L (ref 20–29)
Calcium: 9.4 mg/dL (ref 8.7–10.3)
Chloride: 102 mmol/L (ref 96–106)
Creatinine, Ser: 1.27 mg/dL — ABNORMAL HIGH (ref 0.57–1.00)
GFR calc Af Amer: 43 mL/min/{1.73_m2} — ABNORMAL LOW (ref >59)
GFR calc non Af Amer: 37 mL/min/{1.73_m2} — ABNORMAL LOW (ref >59)
Glucose: 104 mg/dL — ABNORMAL HIGH (ref 65–99)
Potassium: 4.2 mmol/L (ref 3.5–5.2)
Sodium: 142 mmol/L (ref 134–144)

## 2020-11-05 LAB — PRO B NATRIURETIC PEPTIDE: NT-Pro BNP: 2588 pg/mL — ABNORMAL HIGH (ref 0–738)

## 2020-11-09 ENCOUNTER — Telehealth: Payer: Self-pay | Admitting: *Deleted

## 2020-11-09 NOTE — Telephone Encounter (Signed)
Called and left a voicemail to schedule a palliative care visit. Contact information left for return call.

## 2020-11-16 DIAGNOSIS — L821 Other seborrheic keratosis: Secondary | ICD-10-CM | POA: Diagnosis not present

## 2020-11-16 DIAGNOSIS — D1801 Hemangioma of skin and subcutaneous tissue: Secondary | ICD-10-CM | POA: Diagnosis not present

## 2020-11-17 DIAGNOSIS — R3914 Feeling of incomplete bladder emptying: Secondary | ICD-10-CM | POA: Diagnosis not present

## 2020-11-18 ENCOUNTER — Telehealth: Payer: Self-pay | Admitting: *Deleted

## 2020-11-18 NOTE — Telephone Encounter (Signed)
Returned patient's call in regards to scheduling a palliative care home visit. Visit scheduled for 3/15@1p .

## 2020-11-24 ENCOUNTER — Other Ambulatory Visit: Payer: Self-pay

## 2020-11-24 ENCOUNTER — Other Ambulatory Visit: Payer: Medicare Other | Admitting: *Deleted

## 2020-11-24 DIAGNOSIS — Z515 Encounter for palliative care: Secondary | ICD-10-CM

## 2020-11-30 ENCOUNTER — Ambulatory Visit (HOSPITAL_COMMUNITY): Payer: Medicare Other | Attending: Cardiovascular Disease

## 2020-11-30 ENCOUNTER — Other Ambulatory Visit: Payer: Self-pay

## 2020-11-30 DIAGNOSIS — K219 Gastro-esophageal reflux disease without esophagitis: Secondary | ICD-10-CM | POA: Diagnosis not present

## 2020-11-30 DIAGNOSIS — I4891 Unspecified atrial fibrillation: Secondary | ICD-10-CM | POA: Diagnosis not present

## 2020-11-30 DIAGNOSIS — D509 Iron deficiency anemia, unspecified: Secondary | ICD-10-CM | POA: Diagnosis not present

## 2020-11-30 DIAGNOSIS — Z853 Personal history of malignant neoplasm of breast: Secondary | ICD-10-CM | POA: Diagnosis not present

## 2020-11-30 DIAGNOSIS — I2510 Atherosclerotic heart disease of native coronary artery without angina pectoris: Secondary | ICD-10-CM | POA: Insufficient documentation

## 2020-11-30 DIAGNOSIS — E7800 Pure hypercholesterolemia, unspecified: Secondary | ICD-10-CM | POA: Diagnosis not present

## 2020-11-30 DIAGNOSIS — I2583 Coronary atherosclerosis due to lipid rich plaque: Secondary | ICD-10-CM | POA: Diagnosis not present

## 2020-11-30 DIAGNOSIS — N1831 Chronic kidney disease, stage 3a: Secondary | ICD-10-CM | POA: Diagnosis not present

## 2020-11-30 DIAGNOSIS — I272 Pulmonary hypertension, unspecified: Secondary | ICD-10-CM | POA: Diagnosis not present

## 2020-11-30 DIAGNOSIS — N183 Chronic kidney disease, stage 3 unspecified: Secondary | ICD-10-CM | POA: Diagnosis not present

## 2020-11-30 DIAGNOSIS — I482 Chronic atrial fibrillation, unspecified: Secondary | ICD-10-CM | POA: Insufficient documentation

## 2020-11-30 DIAGNOSIS — D649 Anemia, unspecified: Secondary | ICD-10-CM | POA: Diagnosis not present

## 2020-11-30 DIAGNOSIS — M810 Age-related osteoporosis without current pathological fracture: Secondary | ICD-10-CM | POA: Diagnosis not present

## 2020-11-30 LAB — ECHOCARDIOGRAM COMPLETE
AR max vel: 1.59 cm2
AV Area VTI: 1.65 cm2
AV Area mean vel: 1.43 cm2
AV Mean grad: 5.8 mmHg
AV Peak grad: 11 mmHg
Ao pk vel: 1.66 m/s
MV M vel: 5 m/s
MV Peak grad: 100 mmHg
Radius: 0.7 cm
S' Lateral: 3.1 cm

## 2020-12-02 ENCOUNTER — Ambulatory Visit
Admission: RE | Admit: 2020-12-02 | Discharge: 2020-12-02 | Disposition: A | Discharge status: Home / Self Care | Payer: Medicare Other | Source: Ambulatory Visit | Attending: Oncology | Admitting: Oncology

## 2020-12-02 ENCOUNTER — Other Ambulatory Visit: Payer: Self-pay

## 2020-12-02 DIAGNOSIS — C50212 Malignant neoplasm of upper-inner quadrant of left female breast: Secondary | ICD-10-CM

## 2020-12-02 DIAGNOSIS — C50811 Malignant neoplasm of overlapping sites of right female breast: Secondary | ICD-10-CM

## 2020-12-02 DIAGNOSIS — R922 Inconclusive mammogram: Secondary | ICD-10-CM | POA: Diagnosis not present

## 2020-12-03 ENCOUNTER — Telehealth: Payer: Self-pay | Admitting: *Deleted

## 2020-12-03 DIAGNOSIS — I340 Nonrheumatic mitral (valve) insufficiency: Secondary | ICD-10-CM

## 2020-12-03 NOTE — Telephone Encounter (Signed)
-----   Message from Jerline Pain, MD sent at 12/02/2020  3:40 PM EDT ----- Normal pump function.  Moderate mitral valve regurgitation, mild aortic valve stenosis. Continue with current medical management. Repeat echocardiogram in 1 year to evaluate mitral regurgitation once again. Candee Furbish, MD

## 2020-12-03 NOTE — Telephone Encounter (Signed)
Pt has been notified of echo results by phone with verbal understanding. Pt thanked me for the call today. Pt agreeable to repeat echo in 1 yr. Order placed for echo. Patient notified of result.  Please refer to phone note from today for complete details.   Julaine Hua, [place] 12/03/2020 2:01 PM

## 2020-12-07 ENCOUNTER — Other Ambulatory Visit: Payer: Self-pay | Admitting: Cardiology

## 2020-12-07 NOTE — Telephone Encounter (Signed)
Prescription refill request for Eliquis received.  Indication: afib  Last office visit: [place] 11/04/2020 Scr: 1.27, 11/04/2020 Age: 87 yo  Weight: 67.6 kg   Pt is on the correct dose of Eliquis per dosing criteria, prescription refill sent for Eliquis 5mg  BID.

## 2020-12-14 NOTE — Progress Notes (Signed)
[facility] PALLIATIVE CARE RN NOTE  PATIENT NAME: Adrienne [name] DOB: 05-Dec-1927 MRN: [id number]  PRIMARY CARE PROVIDER: Kathyrn Lass, MD  RESPONSIBLE PARTY:  Acct ID - Guarantor Home Phone Work Phone Relationship Acct Type  [id number] JOZALYNN, [id number]  Self P/F     [address], [place], [place] [address]   Covid-19 Pre-screening Negative  PLAN OF CARE and INTERVENTION:  1. ADVANCE CARE PLANNING/GOALS OF CARE: Goal is for patient to remain at home with her husband and avoid hospitalizations.  2. PATIENT/CAREGIVER EDUCATION: Symptom management, CHF management, safe mobility, s/s of infection 3. DISEASE STATUS: Met with patient and her husband in their home. Patient remains alert and oriented x 4, pleasant and engaging. She denies any pain or discomfort at this time. She does experience some shortness of breath with exertion at times. She has oxygen available at home if needed. She has some edema noted to bilateral lower extremities but appears stable. She continues to take her Lasix on a daily basis. She is currently being treated with a 7 day course of antibiotics for a UTI. She goes to a Urologist monthly to get her suprapubic catheter changed and she also had a UTI last month and was treated with the same antibiotic. She says that she can't tell when she has a UTI. She will occasionally notice bloody urine in her drainage bag, but will usually go away on its on in a couple of days. She remains independent with all ADLs. She does have to take rest periods at times. She is ambulatory without the use of assistive devices. She says her appetite is ok. There are times where she does not have much of an appetite, but feels it is adequate. She denies dysphagia. She is continent of her bowels and is not having any issues. She had a recent visit to her Dermatologist for a dark pink area to her right abdomen.  She was told this is nothing to worry about and they will see her back in a year  for her annual check up. Will continue to monitor.  HISTORY OF PRESENT ILLNESS:  This is a 87 yo female who has a history of CVA, chronic Afib, pulmonary HTN, HLD, GERD and chronic cough. Palliative care team continues to follow patient and will visit patient monthly and PRN.   CODE STATUS: Full code  ADVANCED DIRECTIVES: Y MOST FORM: no PPS: 50%   PHYSICAL EXAM:   LUNGS: clear to auscultation  CARDIAC: Cor irreg EXTREMITIES: Edema noted to bilateral lower extremities but is stable SKIN: Exposed skin is dry and intact  NEURO: Alert and oriented x 4, pleasant mood, ambulatory   (Duration of visit and documentation 60 minutes)    Daryl Eastern, RN BSN

## 2020-12-15 DIAGNOSIS — R3914 Feeling of incomplete bladder emptying: Secondary | ICD-10-CM | POA: Diagnosis not present

## 2020-12-20 NOTE — Progress Notes (Signed)
[name]  Telephone:([phone]) [phone] Fax:([phone]) [phone]     ID: Adrienne [name] DOB: September 19, 1927  MR#: [id number]  BLT#:[id number]  Patient Care Team: Adrienne Lass, MD as PCP - General (Family Medicine) Adrienne Pain, MD as PCP - Cardiology (Cardiology) Adrienne [name], Adrienne All, MD as Consulting Physician (Obstetrics and Gynecology) Adrienne [name], Adrienne Dad, MD as Consulting Physician (Oncology) Adrienne Pain, MD as Consulting Physician (Cardiology) Adrienne Levans, MD as Referring Physician (Dermatology) Adrienne Nash, DO as Consulting Physician (Pulmonary Disease) Adrienne Bookbinder, MD as Consulting Physician (General Surgery) Adrienne Pray, MD as Consulting Physician (Radiation Oncology) Adrienne Cruel, MD OTHER MD:   CHIEF COMPLAINT: Bilateral breast cancer,  estrogen receptor positive (s/p right mastectomy)  CURRENT TREATMENT: Observation   INTERVAL HISTORY: Adrienne [name] returns today for follow up of her bilateral breast cancers. She continues under observation.  She is accompanied by her husband Adrienne [name].  Since her last visit, she underwent left diagnostic mammography with tomography at [place] on 12/02/2020 showing: breast density category C; no evidence of malignancy.  More specifically the previously demonstrated hematoma has significantly decreased in size   REVIEW OF SYSTEMS: Adrienne [name] tells me she has to wear a Foley catheter because she had problems with her pessary and without the pessary in place there is mechanical obstruction to urination.  She has been offered surgery but at this point she is opting for the Foley and is more or less comfortable with it.  "It is just a nuisance".  She is not exercising regularly.  A detailed review of systems today was otherwise stable.   COVID 19 VACCINATION STATUS: Status post Pfizer x2+ booster October 2021   RIGHT BREAST CANCER HISTORY: From the original intake note:  Adrienne [name] has a  right breast mass noted by her primary care doctor in [place] in 2012.  She was referred to surgery and underwent right mastectomy and axillary lymph node dissection or sampling (we do not have the pathology report) 06/04/2011 for what appears to have been a multicentric invasive ductal carcinoma, grade 2.  There were three separate tumor foci, spanning a total of 3.8 cm. Prognostic indicators were: ER positive, PR positive, and Her2 negative. One out of 3 biopsied lymph nodes was positive for micrometastasis.   LEFT BREAST CANCER HISTORY: Adrienne [name] had routine left screening mammography on 11/19/2019 showing a possible abnormality. She underwent left diagnostic mammography with tomography and left breast ultrasonography at [facility] on 12/05/2019 showing: breast density category C; 0.7 cm left breast mass at 11 o'clock; development of three left breast cysts compared to prior imaging from [place] in 11/2017; no left axillary adenopathy.  Accordingly on 12/10/2019 she proceeded to biopsy of the left breast area in question. The pathology from this procedure ([id number]) showed: invasive ductal carcinoma, grade 3; ductal carcinoma in situ. Prognostic indicators significant for: estrogen receptor, 0% negative and progesterone receptor, 0% negative. Proliferation marker Ki67 at 90%. HER2 negative by immunohistochemistry (0).  The patient's subsequent history is as detailed below.   PAST MEDICAL HISTORY: Past Medical History:  Diagnosis Date  . Afib ([place])   . Arthritis of ankle   . CAD (coronary artery disease)   . Cancer [facility]) 2012   breast cancer-right  . Compression fracture of T12 vertebra (HCC)   . Diverticulosis   . Edema   . Elevated uric acid in blood   . GERD (gastroesophageal reflux disease)   . GI bleed 12/2015  . Hyperlipidemia   .  Hypertension   . Long-term (current) use of anticoagulants   . Moderate mitral regurgitation   . Personal history of radiation therapy    . Pulmonary HTN ([place])    Echo 1/19: EF 60-65, no RWMA, Ao sclerosis, trivial AI, mild MR, mod BAE, mod TR, PASP 49  . Stroke ([place]) 01/25/2016  . TIA (transient ischemic attack) 09/2011  . Urinary incontinence   . Urinary retention     PAST SURGICAL HISTORY: Past Surgical History:  Procedure Laterality Date  . APPENDECTOMY    . AXILLARY SENTINEL NODE BIOPSY Left 01/22/2020   Procedure: Left Axillary Sentinel Node Biopsy;  Surgeon: Adrienne Bookbinder, MD;  Location: [place];  Service: General;  Laterality: Left;  . BREAST LUMPECTOMY WITH RADIOACTIVE SEED AND SENTINEL LYMPH NODE BIOPSY Left 01/22/2020   Procedure: LEFT BREAST LUMPECTOMY WITH RADIOACTIVE SEED;  Surgeon: Adrienne Bookbinder, MD;  Location: [place];  Service: General;  Laterality: Left;  . BREAST SURGERY  2012   Rt.mastectomy--[place], [place]  2013  . CATARACT EXTRACTION, BILATERAL    . CERVICAL CONIZATION W/BX N/A 08/08/2020   Procedure: VAGINAL SIDEWALL BIOPSY;  Surgeon: Megan Salon, MD;  Location: [facility];  Service: Gynecology;  Laterality: N/A;  . DILATION AND CURETTAGE OF UTERUS     in her early 34's for DUB  . ESOPHAGOGASTRODUODENOSCOPY Left 01/08/2016   Procedure: ESOPHAGOGASTRODUODENOSCOPY (EGD);  Surgeon: Arta Silence, MD;  Location: [facility];  Service: Endoscopy;  Laterality: Left;  . GALLBLADDER SURGERY    . MASTECTOMY Right 2012  . RADIOLOGY WITH ANESTHESIA N/A 01/25/2016   Procedure: RADIOLOGY WITH ANESTHESIA;  Surgeon: Luanne Bras, MD;  Location: [facility];  Service: Radiology;  Laterality: N/A;  . REPLACEMENT TOTAL KNEE BILATERAL    . TONSILLECTOMY AND ADENOIDECTOMY     as a child    FAMILY HISTORY: Family History  Problem Relation Age of Onset  . Asthma Mother   . CVA Father   . Atrial fibrillation Sister        HAS PACER  . Pneumonia Brother   . Cancer Daughter        COLON CANCER  . Cancer Maternal Grandmother        breast cancer  Patient's father was 63 years old  when he died from stroke. Patient's mother died from coronary artery disease at age 65. The patient denies a family hx of ovarian cancer. She reports breast cancer in her maternal grandmother. She has 2 siblings, 1 brother and 1 sister. Her brother died from stroke/AIDS. She also reports pancreatic cancer in her paternal grandmother and colon cancer in her daughter.   GYNECOLOGIC HISTORY:  Patient's last menstrual period was 09/12/1973 (approximate). Menarche: 87 years old Age at first live birth: 87 years old [place] P 3 LMP late 31s Contraceptive never HRT yes, remote , less than 6 months Hysterectomy?  No BSO?  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  Her husband Adrienne [name] is a Marketing executive, (PhD, worked at [employer]).  Their children are Lockie Pares who lives in [place] and whose husband is a English as a second language teacher; Blair [employer]") who lives in [place] and works in finances; and [place], who lives in [place] and works for [employer].  The patient has 11 grandchildren and 8 great-grandchildren.  She attends [employer] locally    ADVANCED DIRECTIVES: In the absence of documents to  the contrary the patient's husband is her healthcare power of attorney   HEALTH MAINTENANCE: Social History   Tobacco Use  . Smoking status: Former Smoker    Packs/day: 0.25    Years: 2.00    Pack years: 0.50    Types: Cigarettes    Quit date: 1950    Years since quitting: 72.3  . Smokeless tobacco: Never Used  Vaping Use  . Vaping Use: Never used  Substance Use Topics  . Alcohol use: No    Alcohol/week: 0.0 standard drinks  . Drug use: No     Colonoscopy: Remote  PAP: 02/2019, negative  Bone density: Osteopenia   Allergies  Allergen Reactions  . Valium [Diazepam] Other (See Comments)    HYPERACTIVITY   . Amiodarone Hcl [Amiodarone] Other (See Comments)    AFIB  . Compazine  [Prochlorperazine] Other (See Comments)    HYPERACTIVITY   . Other Other (See Comments)    "Kidney dye"--patient states this gave her severe shakes/chills  . Thorazine [Chlorpromazine] Other (See Comments)    HYPERACTIVITY   . Zometa [Zoledronic Acid] Other (See Comments)    PROBLEMS WITH EYES  TIA  . Ciprofloxacin Swelling    "swelling" in Rt.elbow  . Doxycycline Nausea Only    Current Outpatient Medications  Medication Sig Dispense Refill  . acetaminophen (TYLENOL) 500 MG tablet Take 500-1,000 mg by mouth every 12 (twelve) hours as needed ([name]).     Marland Kitchen albuterol (VENTOLIN HFA) 108 (90 Base) MCG/ACT inhaler Inhale 1 puff into the lungs every 6 (six) hours as needed for wheezing or shortness of breath (Cough). 18 g 2  . amoxicillin (AMOXIL) 500 MG capsule 1 capsule for dental visits    . Ascorbic Acid (VITAMIN C WITH ROSE HIPS) 500 MG tablet Take 500 mg by mouth in the morning and at bedtime.    Marland Kitchen atorvastatin (LIPITOR) 10 MG tablet TAKE 1 TABLET DAILY (Patient taking differently: Take 20 mg by mouth daily.) 90 tablet 3  . Calcium Citrate-Vitamin D (SM CALCIUM CITRATE+D3 PETITE PO) Take 1 tablet by mouth in the morning, at noon, and at bedtime.    Marland Kitchen diltiazem (CARDIZEM) 60 MG tablet TAKE 1 TABLET THREE TIMES A DAY 270 tablet 3  . ELIQUIS 5 MG TABS tablet TAKE 1 TABLET TWICE A DAY 180 tablet 1  . furosemide (LASIX) 20 MG tablet Take 1 tablet (20 mg total) by mouth daily. 90 tablet 3  . iron polysaccharides (NIFEREX) 150 MG capsule Take 1 capsule (150 mg total) by mouth daily. 30 capsule 0  . metoprolol tartrate (LOPRESSOR) 100 MG tablet TAKE 1 TABLET TWICE A DAY 180 tablet 3  . Multiple Vitamin (MULTIVITAMIN WITH MINERALS) TABS tablet Take 1 tablet by mouth daily. Centrum for Women 50+    . omeprazole (PRILOSEC) 10 MG capsule Take 10 mg by mouth daily before breakfast.     . Probiotic Product (UP4 PROBIOTICS WOMENS PO) Take 1 capsule by mouth daily.     No current facility-administered  medications for this visit.    OBJECTIVE: White woman who appears stated age  58:   12/21/20 1354  BP: (!) 153/54  Pulse: (!) 53  Resp: 18  Temp: 97.9 F (36.6 C)  SpO2: 97%     Body mass index is 30.49 kg/m.   Wt Readings from Last 3 Encounters:  12/21/20 164 lb (74.4 kg)  11/04/20 149 lb (67.6 kg)  10/09/20 144 lb (65.3 kg)     ECOG FS: 1  Sclerae unicteric, EOMs intact Wearing a mask No cervical or supraclavicular adenopathy Lungs no rales or rhonchi Heart regular rate and rhythm Abd soft, nontender MSK no focal spinal tenderness, grade 1 bilateral lower extremity lymphedema Neuro: nonfocal, well oriented, positive affect Breasts: The right breast is status post mastectomy.  There is no evidence of chest wall recurrence.  The left breast is status post lumpectomy and radiation.  There is no evidence of local recurrence.  The hematoma is more difficult to palpate and clearly smaller than prior.  Both axillae are benign.   LAB RESULTS:  CMP     Component Value Date/Time   NA 142 11/04/2020 1647   K 4.2 11/04/2020 1647   CL 102 11/04/2020 1647   CO2 25 11/04/2020 1647   GLUCOSE 104 (H) 11/04/2020 1647   GLUCOSE 126 (H) 09/09/2020 1136   BUN 17 11/04/2020 1647   CREATININE 1.27 (H) 11/04/2020 1647   CALCIUM 9.4 11/04/2020 1647   PROT 6.5 08/08/2020 0101   PROT 6.6 05/06/2019 1608   ALBUMIN 3.3 (L) 08/08/2020 0101   ALBUMIN 3.9 05/06/2019 1608   AST 17 08/08/2020 0101   ALT 14 08/08/2020 0101   ALKPHOS 79 08/08/2020 0101   BILITOT 0.7 08/08/2020 0101   BILITOT 0.4 05/06/2019 1608   GFRNONAA 37 (L) 11/04/2020 1647   GFRNONAA 48 (L) 09/09/2020 1136   GFRAA 43 (L) 11/04/2020 1647    No results found for: TOTALPROTELP, ALBUMINELP, A1GS, A2GS, BETS, BETA2SER, GAMS, MSPIKE, SPEI  No results found for: KPAFRELGTCHN, LAMBDASER, KAPLAMBRATIO  Lab Results  Component Value Date   WBC 7.4 12/21/2020   NEUTROABS 5.4 12/21/2020   HGB 12.2 12/21/2020   HCT  37.3 12/21/2020   MCV 90.8 12/21/2020   PLT 217 12/21/2020    No results found for: LABCA2  No components found for: [id number]  No results for input(s): INR in the last 168 hours.  No results found for: LABCA2  No results found for: HFW263  No results found for: ZCH885  No results found for: OYD741  No results found for: CA2729  No components found for: HGQUANT  No results found for: CEA1 / No results found for: CEA1   No results found for: AFPTUMOR  No results found for: CHROMOGRNA  No results found for: HGBA, HGBA2QUANT, HGBFQUANT, HGBSQUAN (Hemoglobinopathy evaluation)   No results found for: LDH  Lab Results  Component Value Date   IRON 66 08/25/2020   (Iron and TIBC)  Lab Results  Component Value Date   FERRITIN 101 08/25/2020    Urinalysis    Component Value Date/Time   COLORURINE YELLOW 08/29/2017 2343   APPEARANCEUR CLOUDY (A) 08/29/2017 2343   LABSPEC 1.005 08/29/2017 2343   PHURINE 6.0 08/29/2017 2343   GLUCOSEU NEGATIVE 08/29/2017 2343   HGBUR SMALL (A) 08/29/2017 2343   BILIRUBINUR n 04/14/2020 [place] 08/29/2017 2343   PROTEINUR Negative 04/14/2020 1705   PROTEINUR NEGATIVE 08/29/2017 2343   UROBILINOGEN 0.2 04/14/2020 1705   NITRITE n 04/14/2020 1705   NITRITE NEGATIVE 08/29/2017 2343   LEUKOCYTESUR Negative 04/14/2020 1705    STUDIES: MM DIAG BREAST TOMO UNI LEFT  Result Date: 12/02/2020 CLINICAL DATA:  Status post left lumpectomy and radiation therapy breast cancer March 2021. Status post right mastectomy breast cancer in 2011. EXAM: DIGITAL DIAGNOSTIC UNILATERAL LEFT MAMMOGRAM WITH TOMOSYNTHESIS AND CAD TECHNIQUE: Left digital diagnostic mammography and breast tomosynthesis was performed. The images were evaluated with computer-aided detection. COMPARISON:  Previous exam(s). ACR  Breast Density Category c: The breast tissue is heterogeneously dense, which may obscure small masses. FINDINGS: Post lumpectomy and  postradiation changes are again demonstrated on the left. There has been interval significant decrease in size of the previously demonstrated lumpectomy hematoma. IMPRESSION: No evidence of malignancy. RECOMMENDATION: Left diagnostic mammogram in 1 year. I have discussed the findings and recommendations with the patient. If applicable, a reminder letter will be sent to the patient regarding the next appointment. BI-RADS CATEGORY  2: Benign. Electronically Signed   By: Adrienne [name] M.D.   On: 12/02/2020 14:17   ECHOCARDIOGRAM COMPLETE  Result Date: 11/30/2020    ECHOCARDIOGRAM REPORT   Patient Name:   Adrienne [name] Date of Exam: 11/30/2020 Medical Rec #:  [id number]                Height:       61.5 in Accession #:    [id number]               Weight:       149.0 lb Date of Birth:  14-Jun-1928                BSA:          1.677 m Patient Age:    35 years                 BP:           120/80 mmHg Patient Gender: F                        HR:           64 bpm. Exam Location:  [address] Procedure: 2D Echo, Cardiac Doppler and Color Doppler Indications:    I48.2 Atrial fibrillation  History:        Patient has prior history of Echocardiogram examinations, most                 recent 05/09/2019. CAD, Pulmonary HTN and Stroke,                 Arrythmias:Atrial Fibrillation; Risk Factors:Hypertension,                 Dyslipidemia and Former Smoker.  Sonographer:    Jessee Avers, RDCS Referring Phys: [place]  1. Abnormal septal motion . Left ventricular ejection fraction, by estimation, is 55 to 60%. The left ventricle has normal function. The left ventricle has no regional wall motion abnormalities. Left ventricular diastolic parameters are indeterminate.  2. Right ventricular systolic function is normal. The right ventricular size is normal. There is moderately elevated pulmonary artery systolic pressure.  3. Left atrial size was moderately dilated.  4. Right atrial size was moderately  dilated.  5. The mitral valve is degenerative. Moderate mitral valve regurgitation. No evidence of mitral stenosis. Moderate mitral annular calcification.  6. The aortic valve is tricuspid. There is moderate calcification of the aortic valve. Aortic valve regurgitation is mild. Mild aortic valve stenosis.  7. The inferior vena cava is normal in size with greater than 50% respiratory variability, suggesting right atrial pressure of 3 mmHg. FINDINGS  Left Ventricle: Abnormal septal motion. Left ventricular ejection fraction, by estimation, is 55 to 60%. The left ventricle has normal function. The left ventricle has no regional wall motion abnormalities. Global longitudinal strain performed but not reported based on interpreter judgement due to suboptimal tracking. 3D left ventricular ejection fraction  analysis performed but not reported based on interpreter judgement due to suboptimal quality. The left ventricular internal cavity size was normal in size. There is no left ventricular hypertrophy. Left ventricular diastolic parameters are indeterminate. Right Ventricle: The right ventricular size is normal. No increase in right ventricular wall thickness. Right ventricular systolic function is normal. There is moderately elevated pulmonary artery systolic pressure. The tricuspid regurgitant velocity is 3.51 m/s, and with an assumed right atrial pressure of 8 mmHg, the estimated right ventricular systolic pressure is 78.4 mmHg. Left Atrium: Left atrial size was moderately dilated. Right Atrium: Right atrial size was moderately dilated. Pericardium: There is no evidence of pericardial effusion. Mitral Valve: The mitral valve is degenerative in appearance. There is moderate thickening of the mitral valve leaflet(s). There is moderate calcification of the mitral valve leaflet(s). Moderate mitral annular calcification. Moderate mitral valve regurgitation. No evidence of mitral valve stenosis. Tricuspid Valve: The tricuspid  valve is normal in structure. Tricuspid valve regurgitation is mild . No evidence of tricuspid stenosis. Aortic Valve: The aortic valve is tricuspid. There is moderate calcification of the aortic valve. Aortic valve regurgitation is mild. Mild aortic stenosis is present. Aortic valve mean gradient measures 5.8 mmHg. Aortic valve peak gradient measures 11.0 mmHg. Aortic valve area, by VTI measures 1.65 cm. Pulmonic Valve: The pulmonic valve was normal in structure. Pulmonic valve regurgitation is not visualized. No evidence of pulmonic stenosis. Aorta: The aortic root is normal in size and structure. Venous: The inferior vena cava is normal in size with greater than 50% respiratory variability, suggesting right atrial pressure of 3 mmHg. IAS/Shunts: No atrial level shunt detected by color flow Doppler.  LEFT VENTRICLE PLAX 2D LVIDd:         4.40 cm LVIDs:         3.10 cm LV PW:         1.00 cm LV IVS:        1.00 cm LVOT diam:     2.00 cm LV SV:         63 LV SV Index:   37 LVOT Area:     3.14 cm  RIGHT VENTRICLE RV Basal diam:  3.80 cm RV S prime:     7.93 cm/s TAPSE (M-mode): 1.4 cm RVSP:           57.3 mmHg LEFT ATRIUM             Index       RIGHT ATRIUM           Index LA diam:        4.80 cm 2.86 cm/m  RA Pressure: 8.00 mmHg LA Vol (A2C):   57.0 ml 33.99 ml/m RA Area:     24.30 cm LA Vol (A4C):   75.1 ml 44.79 ml/m RA Volume:   72.80 ml  43.42 ml/m LA Biplane Vol: 65.0 ml 38.76 ml/m  AORTIC VALVE AV Area (Vmax):    1.59 cm AV Area (Vmean):   1.43 cm AV Area (VTI):     1.65 cm AV Vmax:           165.50 cm/s AV Vmean:          112.750 cm/s AV VTI:            0.379 m AV Peak Grad:      11.0 mmHg AV Mean Grad:      5.8 mmHg LVOT Vmax:         83.75 cm/s LVOT Vmean:  51.400 cm/s LVOT VTI:          0.200 m LVOT/AV VTI ratio: 0.53  AORTA Ao Root diam: 3.20 cm Ao Asc diam:  3.40 cm MR Peak grad:    100.0 mmHg  TRICUSPID VALVE MR Mean grad:    65.0 mmHg   TR Peak grad:   49.3 mmHg MR Vmax:          500.00 cm/s TR Vmax:        351.00 cm/s MR Vmean:        379.0 cm/s  Estimated RAP:  8.00 mmHg MR PISA:         3.08 cm    RVSP:           57.3 mmHg MR PISA Eff ROA: 23 mm MR PISA Radius:  0.70 cm     SHUNTS                              Systemic VTI:  0.20 m                              Systemic Diam: 2.00 cm Adrienne Rouge MD Electronically signed by Adrienne Rouge MD Signature Date/Time: 11/30/2020/3:05:15 PM    Final      ELIGIBLE FOR AVAILABLE RESEARCH PROTOCOL: No  ASSESSMENT: 87 y.o. [place] woman   RIGHT BREAST CANCER (1) status post right mastectomy and sentinel lymph node sampling September 2012 for what appears to have been a multifocal T1-T2 N1 (mic) invasive ductal carcinoma, estrogen and progesterone receptor positive, HER-2 not amplified  (2) completed 5 years of anastrozole 2017  LEFT BREAST CANCER (3) status post left breast upper inner quadrant biopsy 12/10/2019 for a clinical T1b N0, stage IB invasive ductal carcinoma grade 3, triple negative, with an MIB-1 of 90%  (4) status post left lumpectomy and sentinel lymph node sampling 01/22/2020 for a pT1a pN0, stage IA invasive ductal carcinoma, with negative margins  (a) ductal carcinoma in situ with necrosis also removed, [name] margins negative  (b) a total of 2 lymph nodes were removed  (5) adjuvant chemotherapy discussed, opted against  (6) meets criteria for genetics testing  (7) adjuvant radiation 03/02/2020 through 03/24/2020 Site Technique Total Dose (Gy) Dose per Fx (Gy) Completed Fx Beam Energies  Breast, Left: Breast_Lt 3D 42.72/42.72 2.67 16/16 6X, 15X    PLAN: Bryah is now just about a year out from definitive surgery for her left breast cancer, with no evidence of disease recurrence.  This is very favorable.  She understands she has a benign seroma/hematoma in the left breast.  This may persist as this already may continue to shrink and eventually disappear.  It is benign.  I also reassured her that a little  discomfort, sensitivity, and even shooting pains in the left breast are common and benign.  She will have repeat mammography in March and see [place] again in April 2023.  They know to call for any other issue that may develop before the next visit  Total encounter time 20 minutes.Adrienne Cruel, MD   12/21/2020 1:58 PM Medical Oncology and Hematology [facility] [place], [place] [address] Tel. [phone]    Fax. [phone]   This document serves as a record of services personally performed by Lurline Del, MD. It was created on his behalf by Wilburn Mylar, a trained medical  scribe. The creation of this record is based on the scribe's personal observations and the provider's statements to them.   I, Lurline Del MD, have reviewed the above documentation for accuracy and completeness, and I agree with the above.   *Total Encounter Time as defined by the Centers for Medicare and Medicaid Services includes, in addition to the face-to-face time of a patient visit (documented in the note above) non-face-to-face time: obtaining and reviewing outside history, ordering and reviewing medications, tests or procedures, care coordination (communications with other health care professionals or caregivers) and documentation in the medical record.

## 2020-12-21 ENCOUNTER — Other Ambulatory Visit: Payer: Self-pay

## 2020-12-21 ENCOUNTER — Inpatient Hospital Stay: Payer: Medicare Other

## 2020-12-21 ENCOUNTER — Inpatient Hospital Stay: Payer: Medicare Other | Attending: Oncology | Admitting: Oncology

## 2020-12-21 VITALS — BP 153/54 | HR 53 | Temp 97.9°F | Resp 18 | Ht 61.5 in | Wt 164.0 lb

## 2020-12-21 DIAGNOSIS — Z171 Estrogen receptor negative status [ER-]: Secondary | ICD-10-CM | POA: Insufficient documentation

## 2020-12-21 DIAGNOSIS — I2583 Coronary atherosclerosis due to lipid rich plaque: Secondary | ICD-10-CM | POA: Diagnosis not present

## 2020-12-21 DIAGNOSIS — C50212 Malignant neoplasm of upper-inner quadrant of left female breast: Secondary | ICD-10-CM | POA: Diagnosis not present

## 2020-12-21 DIAGNOSIS — I69398 Other sequelae of cerebral infarction: Secondary | ICD-10-CM

## 2020-12-21 DIAGNOSIS — M8580 Other specified disorders of bone density and structure, unspecified site: Secondary | ICD-10-CM | POA: Insufficient documentation

## 2020-12-21 DIAGNOSIS — I2510 Atherosclerotic heart disease of native coronary artery without angina pectoris: Secondary | ICD-10-CM | POA: Diagnosis not present

## 2020-12-21 DIAGNOSIS — Z87891 Personal history of nicotine dependence: Secondary | ICD-10-CM | POA: Diagnosis not present

## 2020-12-21 DIAGNOSIS — Z9011 Acquired absence of right breast and nipple: Secondary | ICD-10-CM | POA: Insufficient documentation

## 2020-12-21 DIAGNOSIS — C50811 Malignant neoplasm of overlapping sites of right female breast: Secondary | ICD-10-CM

## 2020-12-21 DIAGNOSIS — Z170 Estrogen receptor positive status [ER+]: Secondary | ICD-10-CM

## 2020-12-21 DIAGNOSIS — Z853 Personal history of malignant neoplasm of breast: Secondary | ICD-10-CM | POA: Diagnosis not present

## 2020-12-21 DIAGNOSIS — I4819 Other persistent atrial fibrillation: Secondary | ICD-10-CM

## 2020-12-21 DIAGNOSIS — Z923 Personal history of irradiation: Secondary | ICD-10-CM | POA: Diagnosis not present

## 2020-12-21 DIAGNOSIS — Z7189 Other specified counseling: Secondary | ICD-10-CM | POA: Diagnosis not present

## 2020-12-21 LAB — COMPREHENSIVE METABOLIC PANEL
ALT: 10 U/L (ref 0–44)
AST: 16 U/L (ref 15–41)
Albumin: 3.9 g/dL (ref 3.5–5.0)
Alkaline Phosphatase: 88 U/L (ref 38–126)
Anion gap: 10 (ref 5–15)
BUN: 21 mg/dL (ref 8–23)
CO2: 27 mmol/L (ref 22–32)
Calcium: 9.8 mg/dL (ref 8.9–10.3)
Chloride: 104 mmol/L (ref 98–111)
Creatinine, Ser: 1.09 mg/dL — ABNORMAL HIGH (ref 0.44–1.00)
GFR, Estimated: 48 mL/min — ABNORMAL LOW (ref >60)
Glucose, Bld: 93 mg/dL (ref 70–99)
Potassium: 4.6 mmol/L (ref 3.5–5.1)
Sodium: 141 mmol/L (ref 135–145)
Total Bilirubin: 0.7 mg/dL (ref 0.3–1.2)
Total Protein: 7.6 g/dL (ref 6.5–8.1)

## 2020-12-21 LAB — CBC WITH DIFFERENTIAL/PLATELET
Abs Immature Granulocytes: 0.02 10*3/uL (ref 0.00–0.07)
Basophils Absolute: 0 10*3/uL (ref 0.0–0.1)
Basophils Relative: 0 %
Eosinophils Absolute: 0.1 10*3/uL (ref 0.0–0.5)
Eosinophils Relative: 2 %
HCT: 37.3 % (ref 36.0–46.0)
Hemoglobin: 12.2 g/dL (ref 12.0–15.0)
Immature Granulocytes: 0 %
Lymphocytes Relative: 15 %
Lymphs Abs: 1.1 10*3/uL (ref 0.7–4.0)
MCH: 29.7 pg (ref 26.0–34.0)
MCHC: 32.7 g/dL (ref 30.0–36.0)
MCV: 90.8 fL (ref 80.0–100.0)
Monocytes Absolute: 0.7 10*3/uL (ref 0.1–1.0)
Monocytes Relative: 10 %
Neutro Abs: 5.4 10*3/uL (ref 1.7–7.7)
Neutrophils Relative %: 73 %
Platelets: 217 10*3/uL (ref 150–400)
RBC: 4.11 MIL/uL (ref 3.87–5.11)
RDW: 14.8 % (ref 11.5–15.5)
WBC: 7.4 10*3/uL (ref 4.0–10.5)
nRBC: 0 % (ref 0.0–0.2)

## 2020-12-23 ENCOUNTER — Telehealth: Payer: Self-pay | Admitting: Oncology

## 2020-12-23 NOTE — Telephone Encounter (Signed)
Scheduled per 4/11 los. Called and spoke with pt confirmed 4/18 appt

## 2021-01-06 ENCOUNTER — Other Ambulatory Visit: Payer: Medicare Other | Admitting: *Deleted

## 2021-01-06 ENCOUNTER — Other Ambulatory Visit: Payer: Self-pay

## 2021-01-06 DIAGNOSIS — Z515 Encounter for palliative care: Secondary | ICD-10-CM

## 2021-01-11 ENCOUNTER — Other Ambulatory Visit: Payer: Medicare Other

## 2021-01-11 ENCOUNTER — Ambulatory Visit: Payer: Medicare Other | Admitting: Oncology

## 2021-01-12 DIAGNOSIS — R3914 Feeling of incomplete bladder emptying: Secondary | ICD-10-CM | POA: Diagnosis not present

## 2021-01-13 ENCOUNTER — Other Ambulatory Visit: Payer: Self-pay | Admitting: Oncology

## 2021-01-13 ENCOUNTER — Telehealth: Payer: Self-pay | Admitting: Oncology

## 2021-01-13 NOTE — Telephone Encounter (Signed)
Sch per 5/4, Patient aware.

## 2021-01-14 ENCOUNTER — Other Ambulatory Visit: Payer: Self-pay

## 2021-01-14 ENCOUNTER — Encounter: Payer: Self-pay | Admitting: Pulmonary Disease

## 2021-01-14 ENCOUNTER — Ambulatory Visit (INDEPENDENT_AMBULATORY_CARE_PROVIDER_SITE_OTHER): Payer: Medicare Other | Admitting: Pulmonary Disease

## 2021-01-14 VITALS — BP 108/60 | HR 71 | Temp 97.8°F | Ht 61.5 in | Wt 146.4 lb

## 2021-01-14 DIAGNOSIS — C50811 Malignant neoplasm of overlapping sites of right female breast: Secondary | ICD-10-CM

## 2021-01-14 DIAGNOSIS — R918 Other nonspecific abnormal finding of lung field: Secondary | ICD-10-CM

## 2021-01-14 DIAGNOSIS — Z170 Estrogen receptor positive status [ER+]: Secondary | ICD-10-CM | POA: Diagnosis not present

## 2021-01-14 NOTE — Progress Notes (Signed)
Synopsis: Referred in February 2021 for the lung nodules by Dr. Kipp Brood, PCP: By Kathyrn Lass, MD  Subjective:   PATIENT ID: Adrienne [name] GENDER: female DOB: 1928-03-25, MRN: [id number]  Chief Complaint  Patient presents with  . Follow-up    Pt states she has been doing good since last visit. States she has been using a humidifier which has helped with her cough. States she still has SOB with exertion.    This is an 87 year old female past medical history of atrial fibrillation on oral anticoagulation, hyperlipidemia, hypertension, history of breast cancer status post mastectomy.  Patient was initially seen in consultation by Dr. Kipp Brood on 10/04/2019, office visit reviewed.  Dr. Kipp Brood felt as if it be better for [place] to follow-up in pulmonary clinic instead of surgery.  Additionally patient was seen in December 2020 by cardiology Dr. Marlou Porch office note reviewed.  Currently being managed for atrial fibrillation permanent A. fib on Eliquis.  Patient had a prior diagnosis of pulmonary hypertension however follow-up echocardiogram reveals normal RV function.  Patient states that she has had lung nodules for several years.  Dating back since 2015.  She also had images in 2017 from an abdominal CT which showed a 4 mm lower lobe lesion.  She ultimately had follow-up for this.  She had a CT scan in December 2020 which revealed numerous bilateral pulmonary nodules both solid subsolid and groundglass.  The right middle lobe nodule stable since the 2017 and compared to 7.  This is considered benign.  A new right lower lobe nodule in the posterior aspect which is slightly enlarged.  Otherwise stable.  Patient had pet imaging for follow-up in January 2021 this revealed multiple bilateral pulmonary nodules again all of which were nonspecific.  Patient had no evidence of axillary supraclavicular hilar adenopathy there is a small 1 cm low right paratracheal lymph node with an SUV of 3.05.  The  remainder of the lesions there is a small subpleural right lower lobe lesion at 1.1 cm with an SUV max of 2.19 the remainder nodules subpleural within the apex with an SUV max 2.12.  Multiple scattered other nodules.  All subcentimeter in size.  OV 01/14/2021: last seen by me via televisit and has followed with Derl Barrow, NP. Ct chest in October 2021 stable, lung nodules stable or results.  From a respiratory, standpoint patient has no complaints.  Last office visit with Eustaquio Maize was November 2021.  She had etiology on CT imaging of possible MAI which was recommended no additional work-up at that time.  I agree.  She had multiple pulmonary nodules with discussion for possible follow-up but she had no new ones.  All the other previous ones have been unchanged.  History of right breast cancer with a seroma.  Otherwise she is doing well and has no troubles at this time.     Past Medical History:  Diagnosis Date  . Afib ([place])   . Arthritis of ankle   . CAD (coronary artery disease)   . Cancer [facility]) 2012   breast cancer-right  . Compression fracture of T12 vertebra ([facility])   . Diverticulosis   . Edema   . Elevated uric acid in blood   . GERD (gastroesophageal reflux disease)   . GI bleed 12/2015  . Hyperlipidemia   . Hypertension   . Long-term (current) use of anticoagulants   . Moderate mitral regurgitation   . Personal history of radiation therapy   . Pulmonary HTN ([place])  Echo 1/19: EF 60-65, no RWMA, Ao sclerosis, trivial AI, mild MR, mod BAE, mod TR, PASP 49  . Stroke ([place]) 01/25/2016  . TIA (transient ischemic attack) 09/2011  . Urinary incontinence   . Urinary retention      Family History  Problem Relation Age of Onset  . Asthma Mother   . CVA Father   . Atrial fibrillation Sister        HAS PACER  . Pneumonia Brother   . Cancer Daughter        COLON CANCER  . Cancer Maternal Grandmother        breast cancer     Past Surgical History:  Procedure Laterality Date  .  APPENDECTOMY    . AXILLARY SENTINEL NODE BIOPSY Left 01/22/2020   Procedure: Left Axillary Sentinel Node Biopsy;  Surgeon: Rolm Bookbinder, MD;  Location: [place];  Service: General;  Laterality: Left;  . BREAST LUMPECTOMY WITH RADIOACTIVE SEED AND SENTINEL LYMPH NODE BIOPSY Left 01/22/2020   Procedure: LEFT BREAST LUMPECTOMY WITH RADIOACTIVE SEED;  Surgeon: Rolm Bookbinder, MD;  Location: [place];  Service: General;  Laterality: Left;  . BREAST SURGERY  2012   Rt.mastectomy--[place], [place]  2013  . CATARACT EXTRACTION, BILATERAL    . CERVICAL CONIZATION W/BX N/A 08/08/2020   Procedure: VAGINAL SIDEWALL BIOPSY;  Surgeon: Megan Salon, MD;  Location: [facility];  Service: Gynecology;  Laterality: N/A;  . DILATION AND CURETTAGE OF UTERUS     in her early 36's for DUB  . ESOPHAGOGASTRODUODENOSCOPY Left 01/08/2016   Procedure: ESOPHAGOGASTRODUODENOSCOPY (EGD);  Surgeon: Arta Silence, MD;  Location: [facility];  Service: Endoscopy;  Laterality: Left;  . GALLBLADDER SURGERY    . MASTECTOMY Right 2012  . RADIOLOGY WITH ANESTHESIA N/A 01/25/2016   Procedure: RADIOLOGY WITH ANESTHESIA;  Surgeon: Luanne Bras, MD;  Location: [facility];  Service: Radiology;  Laterality: N/A;  . REPLACEMENT TOTAL KNEE BILATERAL    . TONSILLECTOMY AND ADENOIDECTOMY     as a child    Social History   Socioeconomic History  . Marital status: Married    Spouse name: Not on file  . Number of children: Not on file  . Years of education: Not on file  . Highest education level: Not on file  Occupational History  . Not on file  Tobacco Use  . Smoking status: Former Smoker    Packs/day: 0.25    Years: 2.00    Pack years: 0.50    Types: Cigarettes    Quit date: 1950    Years since quitting: 72.3  . Smokeless tobacco: Never Used  Vaping Use  . Vaping Use: Never used  Substance and Sexual Activity  . Alcohol use: No    Alcohol/week: 0.0 standard drinks  . Drug use: No  . Sexual  activity: Not Currently    Partners: Male    Birth control/protection: Post-menopausal  Other Topics Concern  . Not on file  Social History Narrative  . Not on file   Social Determinants of Health   Financial Resource Strain: Not on file  Food Insecurity: Not on file  Transportation Needs: Not on file  Physical Activity: Not on file  Stress: Not on file  Social Connections: Not on file  Intimate Partner Violence: Not on file     Allergies  Allergen Reactions  . Valium [Diazepam] Other (See Comments)    HYPERACTIVITY   . Amiodarone Hcl [Amiodarone] Other (See Comments)    AFIB  .  Compazine [Prochlorperazine] Other (See Comments)    HYPERACTIVITY   . Other Other (See Comments)    "Kidney dye"--patient states this gave her severe shakes/chills  . Thorazine [Chlorpromazine] Other (See Comments)    HYPERACTIVITY   . Zometa [Zoledronic Acid] Other (See Comments)    PROBLEMS WITH EYES  TIA  . Ciprofloxacin Swelling    "swelling" in Rt.elbow  . Doxycycline Nausea Only     Outpatient Medications Prior to Visit  Medication Sig Dispense Refill  . acetaminophen (TYLENOL) 500 MG tablet Take 500-1,000 mg by mouth every 12 (twelve) hours as needed (pain).     Marland Kitchen albuterol (VENTOLIN HFA) 108 (90 Base) MCG/ACT inhaler Inhale 1 puff into the lungs every 6 (six) hours as needed for wheezing or shortness of breath (Cough). 18 g 2  . amoxicillin (AMOXIL) 500 MG capsule Take 500 mg by mouth.    . Ascorbic Acid (VITAMIN C WITH ROSE HIPS) 500 MG tablet Take 500 mg by mouth in the morning and at bedtime.    Marland Kitchen atorvastatin (LIPITOR) 10 MG tablet TAKE 1 TABLET DAILY (Patient taking differently: Take 20 mg by mouth daily.) 90 tablet 3  . Calcium Citrate-Vitamin D (SM CALCIUM CITRATE+D3 PETITE PO) Take 1 tablet by mouth in the morning and at bedtime.    Marland Kitchen diltiazem (CARDIZEM) 60 MG tablet TAKE 1 TABLET THREE TIMES A DAY 270 tablet 3  . ELIQUIS 5 MG TABS tablet TAKE 1 TABLET TWICE A DAY 180 tablet  1  . furosemide (LASIX) 20 MG tablet Take 1 tablet (20 mg total) by mouth daily. 90 tablet 3  . metoprolol tartrate (LOPRESSOR) 100 MG tablet TAKE 1 TABLET TWICE A DAY 180 tablet 3  . Multiple Vitamin (MULTIVITAMIN WITH MINERALS) TABS tablet Take 1 tablet by mouth daily. Centrum for Women 50+    . omeprazole (PRILOSEC) 10 MG capsule Take 10 mg by mouth every other day.    . Probiotic Product (UP4 PROBIOTICS WOMENS PO) Take 1 capsule by mouth daily.     No facility-administered medications prior to visit.    Review of Systems  Constitutional: Negative for chills, fever, malaise/fatigue and weight loss.  HENT: Negative for hearing loss, sore throat and tinnitus.   Eyes: Negative for blurred vision and double vision.  Respiratory: Negative for cough, hemoptysis, sputum production, shortness of breath, wheezing and stridor.   Cardiovascular: Negative for chest pain, palpitations, orthopnea, leg swelling and PND.  Gastrointestinal: Negative for abdominal pain, constipation, diarrhea, heartburn, nausea and vomiting.  Genitourinary: Negative for dysuria, hematuria and urgency.  Musculoskeletal: Negative for joint pain and myalgias.  Skin: Negative for itching and rash.  Neurological: Negative for dizziness, tingling, weakness and headaches.  Endo/Heme/Allergies: Negative for environmental allergies. Does not bruise/bleed easily.  Psychiatric/Behavioral: Negative for depression. The patient is not nervous/anxious and does not have insomnia.   All other systems reviewed and are negative.    Objective:  Physical Exam Vitals reviewed.  Constitutional:      General: She is not in acute distress.    Appearance: She is well-developed.  HENT:     Head: Normocephalic and atraumatic.  Eyes:     General: No scleral icterus.    Conjunctiva/sclera: Conjunctivae normal.     Pupils: Pupils are equal, round, and reactive to light.  Neck:     Vascular: No JVD.     Trachea: No tracheal deviation.   Cardiovascular:     Rate and Rhythm: Normal rate and regular rhythm.  Heart sounds: Normal heart sounds. No murmur heard.   Pulmonary:     Effort: Pulmonary effort is normal. No tachypnea, accessory muscle usage or respiratory distress.     Breath sounds: Normal breath sounds. No stridor. No wheezing, rhonchi or rales.  Abdominal:     General: Bowel sounds are normal. There is no distension.     Palpations: Abdomen is soft.     Tenderness: There is no abdominal tenderness.  Musculoskeletal:        General: No tenderness.     Cervical back: Neck supple.  Lymphadenopathy:     Cervical: No cervical adenopathy.  Skin:    General: Skin is warm and dry.     Capillary Refill: Capillary refill takes less than 2 seconds.     Findings: No rash.  Neurological:     Mental Status: She is alert and oriented to person, place, and time.  Psychiatric:        Behavior: Behavior normal.      Vitals:   01/14/21 1614  BP: 108/60  Pulse: 71  Temp: 97.8 F (36.6 C)  TempSrc: Temporal  SpO2: 95%  Weight: 146 lb 6.4 oz (66.4 kg)  Height: 5' 1.5" (1.562 m)   95% on RA BMI Readings from Last 3 Encounters:  01/14/21 27.21 kg/m  12/21/20 30.49 kg/m  11/04/20 27.70 kg/m   Wt Readings from Last 3 Encounters:  01/14/21 146 lb 6.4 oz (66.4 kg)  12/21/20 164 lb (74.4 kg)  11/04/20 149 lb (67.6 kg)     CBC    Component Value Date/Time   WBC 7.4 12/21/2020 1333   RBC 4.11 12/21/2020 1333   HGB 12.2 12/21/2020 1333   HGB 12.1 08/25/2020 1059   HCT 37.3 12/21/2020 1333   HCT 35.2 08/25/2020 1059   PLT 217 12/21/2020 1333   PLT 395 08/25/2020 1059   MCV 90.8 12/21/2020 1333   MCV 93 08/25/2020 1059   MCH 29.7 12/21/2020 1333   MCHC 32.7 12/21/2020 1333   RDW 14.8 12/21/2020 1333   RDW 14.1 08/25/2020 1059   LYMPHSABS 1.1 12/21/2020 1333   MONOABS 0.7 12/21/2020 1333   EOSABS 0.1 12/21/2020 1333   BASOSABS 0.0 12/21/2020 1333    Chest Imaging: CT of the chest  09/09/2019: Numerous bilateral pulmonary hospice solid and subsolid states various locations.  Compared to the 2017 imaging overall stable there is a new one in the right lower lobe. The patient's images have been independently reviewed by me.    09/20/2019: Nuclear medicine pet imaging: pet imaging completed no evidence of axillary supraclavicular hilar adenopathy there is a small's 1 cm right lower paratracheal lymph node with SUV max 3.05.  Multiple bilateral lower lobe and upper lobe lung nodules still identified all within 1 cm in size.  Largest being in the apex at 1.3 cm SUV max of 2.012 right lower lobe 1.1 cm SUV max 2.19 remainder cystic groundglass attenuating nodule within the right lower lobe also have very low SUV of 0.9.  All of these are considered nonspecific with recommended follow-up at this time. The patient's images have been independently reviewed by me.    Pulmonary Functions Testing Results: No flowsheet data found.  Echocardiogram:  1. The left ventricle has normal systolic function with an ejection  fraction of 60-65%. The cavity size was normal. Left ventricular diastolic  function could not be evaluated secondary to atrial fibrillation.  2. The right ventricle has normal systolic function. The cavity was  normal.  There is no increase in right ventricular wall thickness. Right  ventricular systolic pressure is normal.  3. Left atrial size was severely dilated.  4. There is mild mitral annular calcification present.  5. The aortic valve is tricuspid. Mild sclerosis of the aortic valve.  Aortic valve regurgitation is trivial by color flow Doppler.  6. The aorta is normal unless otherwise noted.  7. The inferior vena cava was dilated in size with >50% respiratory  variability.    Assessment & Plan:     ICD-10-CM   1. Multiple pulmonary nodules  R91.8   2. Malignant neoplasm of overlapping sites of right breast in female, estrogen receptor positive ([place])   C50.811    Z17.0     Discussion:  This is a 87 year old female multiple pulmonary nodules.  History of breast cancer status postmastectomy.  Nodules been stable they have waxed and waned.  She may have a little evidence of MAI on her CT.  No additional follow-up needed at this time. Plan: Patient can follow-up with [place] in 1 year or as needed. Continue routine follow-up with oncology and primary care. I do think she needs any additional images.  I reviewed all this with her today in the office.   Current Outpatient Medications:  .  acetaminophen (TYLENOL) 500 MG tablet, Take 500-1,000 mg by mouth every 12 (twelve) hours as needed (pain). , Disp: , Rfl:  .  albuterol (VENTOLIN HFA) 108 (90 Base) MCG/ACT inhaler, Inhale 1 puff into the lungs every 6 (six) hours as needed for wheezing or shortness of breath (Cough)., Disp: 18 g, Rfl: 2 .  amoxicillin (AMOXIL) 500 MG capsule, Take 500 mg by mouth., Disp: , Rfl:  .  Ascorbic Acid (VITAMIN C WITH ROSE HIPS) 500 MG tablet, Take 500 mg by mouth in the morning and at bedtime., Disp: , Rfl:  .  atorvastatin (LIPITOR) 10 MG tablet, TAKE 1 TABLET DAILY (Patient taking differently: Take 20 mg by mouth daily.), Disp: 90 tablet, Rfl: 3 .  Calcium Citrate-Vitamin D (SM CALCIUM CITRATE+D3 PETITE PO), Take 1 tablet by mouth in the morning and at bedtime., Disp: , Rfl:  .  diltiazem (CARDIZEM) 60 MG tablet, TAKE 1 TABLET THREE TIMES A DAY, Disp: 270 tablet, Rfl: 3 .  ELIQUIS 5 MG TABS tablet, TAKE 1 TABLET TWICE A DAY, Disp: 180 tablet, Rfl: 1 .  furosemide (LASIX) 20 MG tablet, Take 1 tablet (20 mg total) by mouth daily., Disp: 90 tablet, Rfl: 3 .  metoprolol tartrate (LOPRESSOR) 100 MG tablet, TAKE 1 TABLET TWICE A DAY, Disp: 180 tablet, Rfl: 3 .  Multiple Vitamin (MULTIVITAMIN WITH MINERALS) TABS tablet, Take 1 tablet by mouth daily. Centrum for Women 50+, Disp: , Rfl:  .  omeprazole (PRILOSEC) 10 MG capsule, Take 10 mg by mouth every other day., Disp: , Rfl:   .  Probiotic Product (UP4 PROBIOTICS WOMENS PO), Take 1 capsule by mouth daily., Disp: , Rfl:    Garner Nash, DO [facility] 01/14/2021 4:21 PM

## 2021-01-14 NOTE — Patient Instructions (Signed)
Thank you for visiting Dr. Valeta Harms at [facility]. Today we recommend the following:  Return if symptoms worsen or fail to improve. Call us if needed, can follow up in 1 year.    Please do your part to reduce the spread of COVID-19.

## 2021-01-15 DIAGNOSIS — I4891 Unspecified atrial fibrillation: Secondary | ICD-10-CM | POA: Diagnosis not present

## 2021-01-15 DIAGNOSIS — M810 Age-related osteoporosis without current pathological fracture: Secondary | ICD-10-CM | POA: Diagnosis not present

## 2021-01-15 DIAGNOSIS — K219 Gastro-esophageal reflux disease without esophagitis: Secondary | ICD-10-CM | POA: Diagnosis not present

## 2021-01-15 DIAGNOSIS — I2510 Atherosclerotic heart disease of native coronary artery without angina pectoris: Secondary | ICD-10-CM | POA: Diagnosis not present

## 2021-01-15 DIAGNOSIS — I272 Pulmonary hypertension, unspecified: Secondary | ICD-10-CM | POA: Diagnosis not present

## 2021-01-15 DIAGNOSIS — N1831 Chronic kidney disease, stage 3a: Secondary | ICD-10-CM | POA: Diagnosis not present

## 2021-01-15 DIAGNOSIS — D509 Iron deficiency anemia, unspecified: Secondary | ICD-10-CM | POA: Diagnosis not present

## 2021-01-15 DIAGNOSIS — Z853 Personal history of malignant neoplasm of breast: Secondary | ICD-10-CM | POA: Diagnosis not present

## 2021-01-15 DIAGNOSIS — N183 Chronic kidney disease, stage 3 unspecified: Secondary | ICD-10-CM | POA: Diagnosis not present

## 2021-01-15 DIAGNOSIS — D649 Anemia, unspecified: Secondary | ICD-10-CM | POA: Diagnosis not present

## 2021-01-15 DIAGNOSIS — E7800 Pure hypercholesterolemia, unspecified: Secondary | ICD-10-CM | POA: Diagnosis not present

## 2021-01-19 NOTE — Progress Notes (Signed)
COMMUNITY PALLIATIVE CARE RN NOTE  PATIENT NAME: Adrienne [name] DOB: [birth year] MRN: [id number]  PRIMARY CARE PROVIDER: Kathyrn Lass, MD  RESPONSIBLE PARTY:  Acct ID - Guarantor Home Phone Work Phone Relationship Acct Type  [id number] JANIQUE, [id number] [phone]  Self P/F     [address], [place], [place] [address]   Covid-19 Pre-screening Negative  PLAN OF CARE and INTERVENTION:  1. ADVANCE CARE PLANNING/GOALS OF CARE: Goal is for patient to remain as independent as possible. She is a Full code. 2. PATIENT/CAREGIVER EDUCATION: Goals of care discussion (pt wants to remain a Full code), symptom management, s/s of infection 3. DISEASE STATUS: Face-to-face visit completed. Met with patient and her husband in their home. Patient remains alert and oriented x 3, pleasant and engaging. She denies pain at this time. Respirations are even, regular and unlabored. She has oxygen available but has not had to utilize it as of yet. She is independent with ADLs. She does tire easily and requires rest periods at times. She continues with Lasix 20 mg daily. Her lungs are clear. She has edema noted to bilateral lower extremities, but is stable. She says her appetite is fair. She gets full quickly and is not eating as much. She denies issues with swallowing and takes her medications whole with water. Her bowel movements are regular and she is continent. She has a suprapubic catheter. During her last catheter change, her urine was negative for a UTI. Her next catheter change is next week. She has a very supportive family. She orders her groceries and medications online, and her husband will pick them up. Will continue to monitor.   HISTORY OF PRESENT ILLNESS:  This is a 87 yo female who has a history of CVA, chronic Afib, pulmonary HTN, HLD, GERD and chronic cough. Palliative care teamcontinues to follow patient and will visit patient monthly and PRN.   CODE STATUS: Full code ADVANCED DIRECTIVES: Y MOST  FORM: no PPS: 50%   PHYSICAL EXAM:   LUNGS: clear to auscultation  CARDIAC: Cor irreg EXTREMITIES: Edema to bilateral lower extremities (stable with daily Lasix) SKIN: Exposed skin is dry and intact  NEURO: Alert and oriented x 4, pleasant mood, ambulatory   (Duration of visit and documentation 60 minutes)   Daryl Eastern, RN BSN

## 2021-02-01 ENCOUNTER — Other Ambulatory Visit: Payer: Medicare Other | Admitting: *Deleted

## 2021-02-01 ENCOUNTER — Other Ambulatory Visit: Payer: Self-pay

## 2021-02-01 DIAGNOSIS — Z515 Encounter for palliative care: Secondary | ICD-10-CM

## 2021-02-02 DIAGNOSIS — Z23 Encounter for immunization: Secondary | ICD-10-CM | POA: Diagnosis not present

## 2021-02-02 NOTE — Progress Notes (Signed)
COMMUNITY PALLIATIVE CARE RN NOTE  PATIENT NAME: Adrienne [name] DOB: 1928/02/02 MRN: [id number]  PRIMARY CARE PROVIDER: Kathyrn Lass, MD  RESPONSIBLE PARTY: Elmer Picker (husband) Acct ID - Guarantor Home Phone Work Phone Relationship Acct Type  [id number] RASHAUN, [phone]  Self P/F     [address], [place], [place] [address]   Due to the COVID-19 crisis, this virtual check-in visit was done via telephone from my office and it was initiated and consent by this patient and or family.  PLAN OF CARE and INTERVENTION:  1. ADVANCE CARE PLANNING/GOALS OF CARE: Goal is for patient to remain at home with her husband and avoid hospitalizations. She is a Full code. 2. PATIENT/CAREGIVER EDUCATION: Symptom management, s/s of infection 3. DISEASE STATUS: Virtual check-in visit completed via telephone. Patient denies any pain or discomfort at this time. She recently went to the Dentist. She was experiencing some pain when brushing her teeth. She did end up requiring a root canal and have some cavities filled. She is no longer having any tooth pain. She says the Dentist discovered an infection starting and placed her on a 10 day course of Amoxicillin 875 mg by mouth twice daily. She just completed her last dose today. She is planning to have her Booster vaccine tomorrow at one of the local pharmacies. She has discussed with her medical team who gave her the approval for this 2nd Booster. She is due to have her suprapubic catheter changed next week. She denies any issues or evidence of infection. It is draining clear, yellow urine per her report. She remains independent with all ADLs. She is ambulatory without the use of assistive devices. Her appetite is fair. Denies dysphagia. She does tire easily with exertion but has not required the use of her prn Oxygen. She denies any issues with coughing. She continues with some bilateral lower extremity edema, but continues to be managed well with  daily Lasix. Will continue to monitor.   HISTORY OF PRESENT ILLNESS:This is a 87 yo female who has a history of CVA, chronic Afib, pulmonary HTN, HLD, GERD and chronic cough. Palliative care teamcontinues to follow patient and will visit patient monthly and PRN.     CODE STATUS: Full code  ADVANCED DIRECTIVES: Y MOST FORM: no  PPS: 50%   (Duration of visit and documentation 30 minutes)   Daryl Eastern, RN BSN

## 2021-02-09 DIAGNOSIS — R3914 Feeling of incomplete bladder emptying: Secondary | ICD-10-CM | POA: Diagnosis not present

## 2021-02-09 DIAGNOSIS — R338 Other retention of urine: Secondary | ICD-10-CM | POA: Diagnosis not present

## 2021-02-17 ENCOUNTER — Other Ambulatory Visit: Payer: Self-pay

## 2021-02-17 ENCOUNTER — Ambulatory Visit (INDEPENDENT_AMBULATORY_CARE_PROVIDER_SITE_OTHER): Payer: Medicare Other | Admitting: Cardiology

## 2021-02-17 ENCOUNTER — Encounter: Payer: Self-pay | Admitting: Cardiology

## 2021-02-17 VITALS — BP 130/60 | HR 66 | Ht 61.5 in | Wt 144.0 lb

## 2021-02-17 DIAGNOSIS — I340 Nonrheumatic mitral (valve) insufficiency: Secondary | ICD-10-CM

## 2021-02-17 DIAGNOSIS — I482 Chronic atrial fibrillation, unspecified: Secondary | ICD-10-CM

## 2021-02-17 DIAGNOSIS — I2510 Atherosclerotic heart disease of native coronary artery without angina pectoris: Secondary | ICD-10-CM

## 2021-02-17 DIAGNOSIS — I2583 Coronary atherosclerosis due to lipid rich plaque: Secondary | ICD-10-CM

## 2021-02-17 NOTE — Progress Notes (Signed)
Cardiology Office Note:    Date:  02/17/2021   ID:  Adrienne [name], DOB 03-11-1928, MRN [id number]  PCP:  Kathyrn Lass, MD   [place] Prov[facility] Cardiologist:  Candee Furbish, MD     Referring MD: Kathyrn Lass, MD   History of Present Illness:    Adrienne [name] is a 87 y.o. female is here for a follow-up for coronary artery disease, atrial fibrillation, chronic anticoagulation pulmonary hypertension and mitral regurgitation.   Previous Visit: Back in 2017 had intermittent bloody stools and anticoagulation was placed on hold transiently.  GI work-up was unremarkable.  In May 2017 she had right-sided weakness was found to [place] by husband.  Coumadin was restarted about a week prior to this.  Left MCA infarct.  Went to interventional radiology and she was eventually transferred to Eliquis.  Currently under rate control strategy for atrial fibrillation, in the past I tried amiodarone unsuccessfully.  Prior right breast cancer, Zom[place] 2013  Prior coronary artery disease was on catheterization in 2013 that she was nonobstructive.  Echo 2016 showed moderate mitral regurgitation  Had vaginal bleed corrected with surgery. Received blood. Last Thursday woke up with dizziness. Off and on. Now better.  Today, she is feeling moderately well. She is still experiencing shortness of breath. She admitted to the ED in December 2021. She had a cough, shortness of breath and elevated brain natriuretic peptide level. However her COVID and Flu test were negative. She was sent home with oxygen but she hasn't used it.   She had a questions about contradictions between taking lasix and aspirin.She had questions about the valve leakage. lShe denies having any exertional chest pain, tightness, or pressure. She has no lightheadedness, LE edema, syncopal episodes, PND, or orthopnea.    Past Medical History:  Diagnosis Date  . Afib ([place])   . Arthritis of ankle   . CAD (coronary  artery disease)   . Cancer [facility]) 2012   breast cancer-right  . Compression fracture of T12 vertebra (HCC)   . Diverticulosis   . Edema   . Elevated uric acid in blood   . GERD (gastroesophageal reflux disease)   . GI bleed 12/2015  . Hyperlipidemia   . Hypertension   . Long-term (current) use of anticoagulants   . Moderate mitral regurgitation   . Personal history of radiation therapy   . Pulmonary HTN ([place])    Echo 1/19: EF 60-65, no RWMA, Ao sclerosis, trivial AI, mild MR, mod BAE, mod TR, PASP 49  . Stroke ([place]) 01/25/2016  . TIA (transient ischemic attack) 09/2011  . Urinary incontinence   . Urinary retention     Past Surgical History:  Procedure Laterality Date  . APPENDECTOMY    . AXILLARY SENTINEL NODE BIOPSY Left 01/22/2020   Procedure: Left Axillary Sentinel Node Biopsy;  Surgeon: Rolm Bookbinder, MD;  Location: [place];  Service: General;  Laterality: Left;  . BREAST LUMPECTOMY WITH RADIOACTIVE SEED AND SENTINEL LYMPH NODE BIOPSY Left 01/22/2020   Procedure: LEFT BREAST LUMPECTOMY WITH RADIOACTIVE SEED;  Surgeon: Rolm Bookbinder, MD;  Location: [place];  Service: General;  Laterality: Left;  . BREAST SURGERY  2012   Rt.mastectomy--[place], [place]  2013  . CATARACT EXTRACTION, BILATERAL    . CERVICAL CONIZATION W/BX N/A 08/08/2020   Procedure: VAGINAL SIDEWALL BIOPSY;  Surgeon: Megan Salon, MD;  Location: [place];  Service: Gynecology;  Laterality: N/A;  . DILATION AND CURETTAGE OF UTERUS  in her early 34's for DUB  . ESOPHAGOGASTRODUODENOSCOPY Left 01/08/2016   Procedure: ESOPHAGOGASTRODUODENOSCOPY (EGD);  Surgeon: Arta Silence, MD;  Location: [facility];  Service: Endoscopy;  Laterality: Left;  . GALLBLADDER SURGERY    . MASTECTOMY Right 2012  . RADIOLOGY WITH ANESTHESIA N/A 01/25/2016   Procedure: RADIOLOGY WITH ANESTHESIA;  Surgeon: Luanne Bras, MD;  Location: [place];  Service: Radiology;  Laterality: N/A;  . REPLACEMENT TOTAL  KNEE BILATERAL    . TONSILLECTOMY AND ADENOIDECTOMY     as a child    Current Medications: Current Meds  Medication Sig  . acetaminophen (TYLENOL) 500 MG tablet Take 500-1,000 mg by mouth every 12 (twelve) hours as needed (pain).   Marland Kitchen albuterol (VENTOLIN HFA) 108 (90 Base) MCG/ACT inhaler Inhale 1 puff into the lungs every 6 (six) hours as needed for wheezing or shortness of breath (Cough).  Marland Kitchen amoxicillin (AMOXIL) 500 MG capsule Take 500 mg by mouth.  . Ascorbic Acid (VITAMIN C WITH ROSE HIPS) 500 MG tablet Take 500 mg by mouth in the morning and at bedtime.  Marland Kitchen atorvastatin (LIPITOR) 10 MG tablet TAKE 1 TABLET DAILY  . Calcium Citrate-Vitamin D (SM CALCIUM CITRATE+D3 PETITE PO) Take 1 tablet by mouth in the morning and at bedtime.  Marland Kitchen diltiazem (CARDIZEM) 60 MG tablet TAKE 1 TABLET THREE TIMES A DAY  . ELIQUIS 5 MG TABS tablet TAKE 1 TABLET TWICE A DAY  . furosemide (LASIX) 20 MG tablet Take 1 tablet (20 mg total) by mouth daily.  . metoprolol tartrate (LOPRESSOR) 100 MG tablet TAKE 1 TABLET TWICE A DAY  . Multiple Vitamin (MULTIVITAMIN WITH MINERALS) TABS tablet Take 1 tablet by mouth daily. Centrum for Women 50+  . omeprazole (PRILOSEC) 10 MG capsule Take 10 mg by mouth every other day.  . Probiotic Product (UP4 PROBIOTICS WOMENS PO) Take 1 capsule by mouth daily.     Allergies:   Valium [diazepam], Amiodarone hcl [amiodarone], Compazine [prochlorperazine], Other, Thorazine [chlorpromazine], Zometa [zoledronic acid], Ciprofloxacin, and Doxycycline   Social History   Socioeconomic History  . Marital status: Married    Spouse name: Not on file  . Number of children: Not on file  . Years of education: Not on file  . Highest education level: Not on file  Occupational History  . Not on file  Tobacco Use  . Smoking status: Former Smoker    Packs/day: 0.25    Years: 2.00    Pack years: 0.50    Types: Cigarettes    Quit date: 1950    Years since quitting: 72.4  . Smokeless tobacco:  Never Used  Vaping Use  . Vaping Use: Never used  Substance and Sexual Activity  . Alcohol use: No    Alcohol/week: 0.0 standard drinks  . Drug use: No  . Sexual activity: Not Currently    Partners: Male    Birth control/protection: Post-menopausal  Other Topics Concern  . Not on file  Social History Narrative  . Not on file   Social Determinants of Health   Financial Resource Strain: Not on file  Food Insecurity: Not on file  Transportation Needs: Not on file  Physical Activity: Not on file  Stress: Not on file  Social Connections: Not on file     Family History: The patient's family history includes Asthma in her mother; Atrial fibrillation in her sister; CVA in her father; Cancer in her daughter and maternal grandmother; Pneumonia in her brother.  ROS:   Please see the history  of present illness. All other systems reviewed and are negative.  EKGs/Labs/Other Studies Reviewed:    The following studies were reviewed today:  Echo 11/30/2020 Impressions 1. Abnormal septal motion . Left ventricular ejection fraction, by estimation, is 55 to 60%. The left ventricle has normal function. The left ventricle has no regional wall motion abnormalities. Left ventricular diastolic parameters are indeterminate. 2. Right ventricular systolic function is normal. The right ventricular size is normal. There is moderately elevated pulmonary artery systolic pressure. 3. Left atrial size was moderately dilated. 4. Right atrial size was moderately dilated. 5. The mitral valve is degenerative. Moderate mitral valve regurgitation. No evidence of mitral stenosis. Moderate mitral annular calcification. 6. The aortic valve is tricuspid. There is moderate calcification of the aortic valve. Aortic valve regurgitation is mild. Mild aortic valve stenosis. 7. The inferior vena cava is normal in size with greater than 50% respiratory variability, suggesting right atrial pressure of 3 mmHg.  DG  Chest  12/28/20221 1. Findings consistent with mild interstitial edema superimposed upon background scarring.   EKG:  EKG not ordered today.  Recent Labs: 09/09/2020: B Natriuretic Peptide 456.1 11/04/2020: NT-Pro BNP 2,588 12/21/2020: ALT 10; BUN 21; Creatinine, Ser 1.09; Hemoglobin 12.2; Platelets 217; Potassium 4.6; Sodium 141  Recent Lipid Panel    Component Value Date/Time   CHOL 128 05/06/2019 1608   TRIG 101 05/06/2019 1608   HDL 50 05/06/2019 1608   CHOLHDL 2.6 05/06/2019 1608   CHOLHDL 3.3 01/26/2016 0300   VLDL 20 01/26/2016 0300   LDLCALC 58 05/06/2019 1608     Risk Assessment/Calculations:      Physical Exam:    VS:  BP 130/60 (BP Location: Left Arm, Patient Position: Sitting, Cuff Size: Normal)   Pulse 66   Ht 5' 1.5" (1.562 m)   Wt 144 lb (65.3 kg)   LMP 09/12/1973 (Approximate)   SpO2 93%   BMI 26.77 kg/m     Wt Readings from Last 3 Encounters:  02/17/21 144 lb (65.3 kg)  01/14/21 146 lb 6.4 oz (66.4 kg)  12/21/20 164 lb (74.4 kg)     GEN: Well nourished, well developed in no acute distress HEENT: Normal NECK: No JVD; No carotid bruits LYMPHATICS: No lymphadenopathy CARDIAC: Irregularly irregular 2/6 systolic murmur, rubs, gallops RESPIRATORY:  Clear to auscultation without rales, wheezing or rhonchi  ABDOMEN: Soft, non-tender, non-distended MUSCULOSKELETAL: Chronic lower extremity edema; No deformity  SKIN: Warm and dry NEUROLOGIC:  Alert and oriented x 3 PSYCHIATRIC:  Normal affect   ASSESSMENT:    1. Mitral valve insufficiency, unspecified etiology   2. Chronic atrial fibrillation (HCC)   3. Coronary artery disease due to lipid rich plaque    PLAN:    In order of problems listed above:  Mitral valve regurgitation - Moderate seen on echocardiogram in 2022.  She has chronic shortness of breath.  We will continue to monitor.  Check echocardiogram next year.  Permanent atrial fibrillation - Well rate controlled.  Continue with  diltiazem 60 mg 3 times a day as well as metoprolol 100 mg twice a day.  No changes made.  Refills as needed for this medical management.  Chronic anticoagulation Continue with Eliquis 5 mg twice a day.  Refills as needed.  Continue to monitor lab work.  Last hemoglobin 12.2 and creatinine 1.09 from outside labs.  Excellent.  Left MCA stroke when she was off of anticoagulation secondary to gastrointestinal bleeding. - Has not had any further bleeding.  No significant residual stroke  signs.  Hospitalization with vaginal bleeding -Overall doing well currently.  Coronary artery disease - Stable without any anginal symptoms.  We are avoiding aspirin because she is on Eliquis.  Hyperlipidemia -Continue with atorvastatin 10 mg daily.  LDL currently 54.  Carotid artery disease - Continue with goal-directed medical therapy as described above.      Medication Adjustments/Labs and Tests Ordered: Current medicines are reviewed at length with the patient today.  Concerns regarding medicines are outlined above.  Orders Placed This Encounter  Procedures  . ECHOCARDIOGRAM COMPLETE   No orders of the defined types were placed in this encounter.   Patient Instructions  Medication Instructions:  Your physician recommends that you continue on your current medications as directed. Please refer to the Current Medication list given to you today.  *If you need a refill on your cardiac medications before your next appointment, please call your pharmacy*   Lab Work: None If you have labs (blood work) drawn today and your tests are completely normal, you will receive your results only by: Marland Kitchen MyChart Message (if you have MyChart) OR . A paper copy in the mail If you have any lab test that is abnormal or we need to change your treatment, we will call you to review the results.   Testing/Procedures: Your physician has requested that you have an echocardiogram in June 2023. Echocardiography is a  painless test that uses sound waves to create images of your heart. It provides your doctor with information about the size and shape of your heart and how well your heart's chambers and valves are working. This procedure takes approximately one hour. There are no restrictions for this procedure.     Follow-Up: At [facility], you and your health needs are our priority.  As part of our continuing mission to provide you with exceptional heart care, we have created designated Provider Care Teams.  These Care Teams include your primary Cardiologist (physician) and Advanced Practice Providers (APPs -  Physician Assistants and Nurse Practitioners) who all work together to provide you with the care you need, when you need it.  We recommend signing up for the patient portal called "MyChart".  Sign up information is provided on this After Visit Summary.  MyChart is used to connect with patients for Virtual Visits (Telemedicine).  Patients are able to view lab/test results, encounter notes, upcoming appointments, etc.  Non-urgent messages can be sent to your provider as well.   To learn more about what you can do with MyChart, go to [email or web address].    Your next appointment:   1 year(s)  The format for your next appointment:   In Person  Provider:   You may see Candee Furbish, MD or one of the following Advanced Practice Providers on your designated Care Team:    Kathyrn Drown, NP    Other Instructions       I,Essence Turner,acting as a scribe for Candee Furbish, MD.,have documented all relevant documentation on the behalf of Candee Furbish, MD,as directed by  Candee Furbish, MD while in the presence of Candee Furbish, MD.   Signed, Candee Furbish, MD  02/17/2021 10:35 AM    [place]

## 2021-02-17 NOTE — Patient Instructions (Signed)
Medication Instructions:  Your physician recommends that you continue on your current medications as directed. Please refer to the Current Medication list given to you today.  *If you need a refill on your cardiac medications before your next appointment, please call your pharmacy*   Lab Work: None If you have labs (blood work) drawn today and your tests are completely normal, you will receive your results only by: Marland Kitchen MyChart Message (if you have MyChart) OR . A paper copy in the mail If you have any lab test that is abnormal or we need to change your treatment, we will call you to review the results.   Testing/Procedures: Your physician has requested that you have an echocardiogram in June 2023. Echocardiography is a painless test that uses sound waves to create images of your heart. It provides your doctor with information about the size and shape of your heart and how well your heart's chambers and valves are working. This procedure takes approximately one hour. There are no restrictions for this procedure.     Follow-Up: At [facility], you and your health needs are our priority.  As part of our continuing mission to provide you with exceptional heart care, we have created designated Provider Care Teams.  These Care Teams include your primary Cardiologist (physician) and Advanced Practice Providers (APPs -  Physician Assistants and Nurse Practitioners) who all work together to provide you with the care you need, when you need it.  We recommend signing up for the patient portal called "MyChart".  Sign up information is provided on this After Visit Summary.  MyChart is used to connect with patients for Virtual Visits (Telemedicine).  Patients are able to view lab/test results, encounter notes, upcoming appointments, etc.  Non-urgent messages can be sent to your provider as well.   To learn more about what you can do with MyChart, go to [email or web address].    Your next appointment:    1 year(s)  The format for your next appointment:   In Person  Provider:   You may see Candee Furbish, MD or one of the following Advanced Practice Providers on your designated Care Team:    Kathyrn Drown, NP    Other Instructions

## 2021-02-19 ENCOUNTER — Telehealth: Payer: Self-pay

## 2021-02-19 NOTE — Telephone Encounter (Signed)
SW scheduled a visit with patient/family for 03/03/21 @ 12 pm.

## 2021-02-26 DIAGNOSIS — Z20828 Contact with and (suspected) exposure to other viral communicable diseases: Secondary | ICD-10-CM | POA: Diagnosis not present

## 2021-02-26 DIAGNOSIS — Z20822 Contact with and (suspected) exposure to covid-19: Secondary | ICD-10-CM | POA: Diagnosis not present

## 2021-03-03 ENCOUNTER — Other Ambulatory Visit: Payer: Self-pay

## 2021-03-03 ENCOUNTER — Other Ambulatory Visit: Payer: Medicare Other

## 2021-03-03 DIAGNOSIS — Z515 Encounter for palliative care: Secondary | ICD-10-CM

## 2021-03-05 NOTE — Progress Notes (Signed)
COMMUNITY PALLIATIVE CARE SW NOTE  PATIENT NAME: Adrienne [name] DOB: Feb 21, 1928 MRN: [id number]  PRIMARY CARE PROVIDER: Kathyrn Lass, MD  RESPONSIBLE PARTY:  Acct ID - Guarantor Home Phone Work Phone Relationship Acct Type  [id number] REGINA, GANCI782 [phone]  Self P/F     [place], [place], [place] [address]   *COVID screening negative.  PLAN OF CARE and INTERVENTIONS:             GOALS OF CARE/ ADVANCE CARE PLANNING:  Goal is for patient to remain in her home. Patient is a DNR. SOCIAL/EMOTIONAL/SPIRITUAL ASSESSMENT/ INTERVENTIONS:  SW completed a face-to-face visit with patient at her home. She was present in her. Patient report that she has been doing much better. She eating at least 2 good meal per day with her weight being stable at 152 lbs. She sleeping well. She has not had any falls. She ambulates independently. She hand her husband had recent COVID exposure, but quarantined, and took a test that was negative. They limit their outings to running needed errands and going to doctor's appointment. She continues to have SOB with exertion. She has o2, but has not needed to use it. She is dependent for all personal care needs and ADL's. She is ambulating independently. Patient has ongoing swelling to her ankles, but takes lasix daily to manage. Patient and her husband was engaged with SW, reminiscing through pictures in their home. SW provided supportive presence, active listening, assessment of needs and comfort, observation, reassurance of support and reinforced contact information. Patient remains open to ongoing SW and palliative care support. PATIENT/CAREGIVER EDUCATION/ COPING:  Patient report that she is coping well. She has a supportive husband, family and friends.  PERSONAL EMERGENCY PLAN:  911 can be activated fore emergencies. COMMUNITY RESOURCES COORDINATION/ HEALTH CARE NAVIGATION:  None. FINANCIAL/LEGAL CONCERNS/INTERVENTIONS:  None     SOCIAL HX:  Social History    Tobacco Use   Smoking status: Former    Packs/day: 0.25    Years: 2.00    Pack years: 0.50    Types: Cigarettes    Quit date: 1950    Years since quitting: 72.5   Smokeless tobacco: Never  Substance Use Topics   Alcohol use: No    Alcohol/week: 0.0 standard drinks    CODE STATUS: DNR ADVANCED DIRECTIVES: Yes MOST FORM COMPLETE:  No HOSPICE EDUCATION PROVIDED: No  PPS: Patient continues to have SOB. She is independent for ADL's.      Duration of visit and documentation: 60 minutes.      [address] [place], [place]

## 2021-03-16 DIAGNOSIS — R338 Other retention of urine: Secondary | ICD-10-CM | POA: Diagnosis not present

## 2021-03-16 DIAGNOSIS — R3914 Feeling of incomplete bladder emptying: Secondary | ICD-10-CM | POA: Diagnosis not present

## 2021-03-18 DIAGNOSIS — D649 Anemia, unspecified: Secondary | ICD-10-CM | POA: Diagnosis not present

## 2021-03-18 DIAGNOSIS — K219 Gastro-esophageal reflux disease without esophagitis: Secondary | ICD-10-CM | POA: Diagnosis not present

## 2021-03-18 DIAGNOSIS — M810 Age-related osteoporosis without current pathological fracture: Secondary | ICD-10-CM | POA: Diagnosis not present

## 2021-03-18 DIAGNOSIS — I272 Pulmonary hypertension, unspecified: Secondary | ICD-10-CM | POA: Diagnosis not present

## 2021-03-18 DIAGNOSIS — E7800 Pure hypercholesterolemia, unspecified: Secondary | ICD-10-CM | POA: Diagnosis not present

## 2021-03-18 DIAGNOSIS — I2510 Atherosclerotic heart disease of native coronary artery without angina pectoris: Secondary | ICD-10-CM | POA: Diagnosis not present

## 2021-03-18 DIAGNOSIS — I4891 Unspecified atrial fibrillation: Secondary | ICD-10-CM | POA: Diagnosis not present

## 2021-03-18 DIAGNOSIS — N1831 Chronic kidney disease, stage 3a: Secondary | ICD-10-CM | POA: Diagnosis not present

## 2021-03-20 ENCOUNTER — Encounter (HOSPITAL_COMMUNITY): Payer: Self-pay

## 2021-04-01 DIAGNOSIS — L821 Other seborrheic keratosis: Secondary | ICD-10-CM | POA: Diagnosis not present

## 2021-04-01 DIAGNOSIS — L820 Inflamed seborrheic keratosis: Secondary | ICD-10-CM | POA: Diagnosis not present

## 2021-04-01 DIAGNOSIS — D1801 Hemangioma of skin and subcutaneous tissue: Secondary | ICD-10-CM | POA: Diagnosis not present

## 2021-04-01 DIAGNOSIS — L853 Xerosis cutis: Secondary | ICD-10-CM | POA: Diagnosis not present

## 2021-04-01 DIAGNOSIS — D692 Other nonthrombocytopenic purpura: Secondary | ICD-10-CM | POA: Diagnosis not present

## 2021-04-13 DIAGNOSIS — R3914 Feeling of incomplete bladder emptying: Secondary | ICD-10-CM | POA: Diagnosis not present

## 2021-04-29 ENCOUNTER — Other Ambulatory Visit: Payer: Self-pay

## 2021-04-29 ENCOUNTER — Other Ambulatory Visit: Payer: Medicare Other

## 2021-04-29 DIAGNOSIS — Z515 Encounter for palliative care: Secondary | ICD-10-CM

## 2021-05-09 NOTE — Progress Notes (Signed)
COMMUNITY PALLIATIVE CARE SW NOTE  PATIENT NAME: Adrienne [name] DOB: Jan 31, 1928 MRN: [id number]  PRIMARY CARE PROVIDER: Kathyrn Lass, MD  RESPONSIBLE PARTY:  Acct ID - Guarantor Home Phone Work Phone Relationship Acct Type  [id number] LURLEAN, [phone]  Self P/F     [place], [place], [place] [address]   Due to the COVID-19 crisis, this virtual check-in visit was done via telephone from my office and it was initiated and consent by this patient and or family.   PLAN OF CARE and INTERVENTIONS:             GOALS OF CARE/ ADVANCE CARE PLANNING:  Goal is for patient to remain in her home. Patient is a DNR.  SOCIAL/EMOTIONAL/SPIRITUAL ASSESSMENT/ INTERVENTIONS:  SW completed a virtual/telephonic visit with patient. Patient report that she has been stable. She continues to eat 2/3 meals daily. Her weight has been stable. She continue to sleep well. She completes her own ADL's. She ambulates independently and report no falls. She continues to have SOB, but will rest a few minutes to recover. Patient has o2 available if needed, but she has not needed to use it. She continues to have swelling to her ankles, but that has improved with rest and elevation. Patient reported no other concerns. SW provided supportive presence, assessment of her needs and comfort, observation, and reinforced how and when to contact the RN/SW team for support or changes in condition. Patient remains open to palliative team/SW support.  PATIENT/CAREGIVER EDUCATION/ COPING:  Patient continue to cope well.  PERSONAL EMERGENCY PLAN:  911 can be activated for emergencies. COMMUNITY RESOURCES COORDINATION/ HEALTH CARE NAVIGATION:  None FINANCIAL/LEGAL CONCERNS/INTERVENTIONS:  None.     SOCIAL HX:  Social History   Tobacco Use   Smoking status: Former    Packs/day: 0.25    Years: 2.00    Pack years: 0.50    Types: Cigarettes    Quit date: 1950    Years since quitting: 72.7   Smokeless tobacco: Never   Substance Use Topics   Alcohol use: No    Alcohol/week: 0.0 standard drinks    CODE STATUS: DNR ADVANCED DIRECTIVES: Yes MOST FORM COMPLETE:  No HOSPICE EDUCATION PROVIDED: No  PPS: Patient continues to have SOB. She is independent for ADL's.      Duration of telephonic visit and documentation: 30 minutes.  [address] [place], [place]

## 2021-05-18 DIAGNOSIS — R3914 Feeling of incomplete bladder emptying: Secondary | ICD-10-CM | POA: Diagnosis not present

## 2021-06-04 ENCOUNTER — Other Ambulatory Visit: Payer: Self-pay | Admitting: Cardiology

## 2021-06-04 NOTE — Telephone Encounter (Signed)
Eliquis 5 g refill request received. Patient is 87 years old, weight- 65.3 kg, Crea-1.09 on 12/21/20 , Diagnosis-afib, and last seen by Dr. Marlou Porch on 02/17/21. Dose is appropriate based on dosing criteria. Will send in refill to requested pharmacy.

## 2021-06-14 DIAGNOSIS — R338 Other retention of urine: Secondary | ICD-10-CM | POA: Diagnosis not present

## 2021-07-13 DIAGNOSIS — R3914 Feeling of incomplete bladder emptying: Secondary | ICD-10-CM | POA: Diagnosis not present

## 2021-07-19 DIAGNOSIS — H04123 Dry eye syndrome of bilateral lacrimal glands: Secondary | ICD-10-CM | POA: Diagnosis not present

## 2021-07-19 DIAGNOSIS — H524 Presbyopia: Secondary | ICD-10-CM | POA: Diagnosis not present

## 2021-07-22 DIAGNOSIS — W19XXXA Unspecified fall, initial encounter: Secondary | ICD-10-CM | POA: Diagnosis not present

## 2021-07-22 DIAGNOSIS — Z96651 Presence of right artificial knee joint: Secondary | ICD-10-CM | POA: Diagnosis not present

## 2021-07-22 DIAGNOSIS — M25461 Effusion, right knee: Secondary | ICD-10-CM | POA: Diagnosis not present

## 2021-07-22 DIAGNOSIS — M25561 Pain in right knee: Secondary | ICD-10-CM | POA: Diagnosis not present

## 2021-07-26 DIAGNOSIS — M79604 Pain in right leg: Secondary | ICD-10-CM | POA: Diagnosis not present

## 2021-07-26 DIAGNOSIS — M25561 Pain in right knee: Secondary | ICD-10-CM | POA: Diagnosis not present

## 2021-07-26 DIAGNOSIS — M79605 Pain in left leg: Secondary | ICD-10-CM | POA: Diagnosis not present

## 2021-07-28 ENCOUNTER — Other Ambulatory Visit (HOSPITAL_COMMUNITY): Payer: Self-pay | Admitting: Orthopedic Surgery

## 2021-07-28 ENCOUNTER — Other Ambulatory Visit: Payer: Self-pay

## 2021-07-28 ENCOUNTER — Ambulatory Visit (HOSPITAL_COMMUNITY)
Admission: RE | Admit: 2021-07-28 | Discharge: 2021-07-28 | Disposition: A | Discharge status: Home / Self Care | Payer: Medicare Other | Source: Ambulatory Visit | Attending: Orthopedic Surgery | Admitting: Orthopedic Surgery

## 2021-07-28 DIAGNOSIS — M79604 Pain in right leg: Secondary | ICD-10-CM

## 2021-08-04 DIAGNOSIS — Z96651 Presence of right artificial knee joint: Secondary | ICD-10-CM | POA: Diagnosis not present

## 2021-08-04 DIAGNOSIS — M25561 Pain in right knee: Secondary | ICD-10-CM | POA: Diagnosis not present

## 2021-08-04 DIAGNOSIS — S8001XD Contusion of right knee, subsequent encounter: Secondary | ICD-10-CM | POA: Diagnosis not present

## 2021-08-10 DIAGNOSIS — M810 Age-related osteoporosis without current pathological fracture: Secondary | ICD-10-CM | POA: Diagnosis not present

## 2021-08-10 DIAGNOSIS — D649 Anemia, unspecified: Secondary | ICD-10-CM | POA: Diagnosis not present

## 2021-08-10 DIAGNOSIS — N183 Chronic kidney disease, stage 3 unspecified: Secondary | ICD-10-CM | POA: Diagnosis not present

## 2021-08-10 DIAGNOSIS — I2510 Atherosclerotic heart disease of native coronary artery without angina pectoris: Secondary | ICD-10-CM | POA: Diagnosis not present

## 2021-08-10 DIAGNOSIS — E7800 Pure hypercholesterolemia, unspecified: Secondary | ICD-10-CM | POA: Diagnosis not present

## 2021-08-10 DIAGNOSIS — K219 Gastro-esophageal reflux disease without esophagitis: Secondary | ICD-10-CM | POA: Diagnosis not present

## 2021-08-10 DIAGNOSIS — I272 Pulmonary hypertension, unspecified: Secondary | ICD-10-CM | POA: Diagnosis not present

## 2021-08-10 DIAGNOSIS — D509 Iron deficiency anemia, unspecified: Secondary | ICD-10-CM | POA: Diagnosis not present

## 2021-08-10 DIAGNOSIS — I4891 Unspecified atrial fibrillation: Secondary | ICD-10-CM | POA: Diagnosis not present

## 2021-08-17 ENCOUNTER — Other Ambulatory Visit: Payer: Self-pay

## 2021-08-17 ENCOUNTER — Inpatient Hospital Stay (HOSPITAL_COMMUNITY)
Admission: EM | Admit: 2021-08-17 | Discharge: 2021-08-19 | DRG: 291 | Disposition: A | Discharge status: Home / Self Care | Payer: Medicare Other | Attending: Family Medicine | Admitting: Family Medicine

## 2021-08-17 ENCOUNTER — Encounter (HOSPITAL_COMMUNITY): Payer: Self-pay

## 2021-08-17 ENCOUNTER — Emergency Department (HOSPITAL_COMMUNITY): Payer: Medicare Other

## 2021-08-17 DIAGNOSIS — Z8673 Personal history of transient ischemic attack (TIA), and cerebral infarction without residual deficits: Secondary | ICD-10-CM

## 2021-08-17 DIAGNOSIS — E785 Hyperlipidemia, unspecified: Secondary | ICD-10-CM | POA: Diagnosis not present

## 2021-08-17 DIAGNOSIS — I5033 Acute on chronic diastolic (congestive) heart failure: Secondary | ICD-10-CM | POA: Diagnosis present

## 2021-08-17 DIAGNOSIS — J449 Chronic obstructive pulmonary disease, unspecified: Secondary | ICD-10-CM | POA: Diagnosis not present

## 2021-08-17 DIAGNOSIS — Z803 Family history of malignant neoplasm of breast: Secondary | ICD-10-CM | POA: Diagnosis not present

## 2021-08-17 DIAGNOSIS — I482 Chronic atrial fibrillation, unspecified: Secondary | ICD-10-CM | POA: Diagnosis not present

## 2021-08-17 DIAGNOSIS — E8770 Fluid overload, unspecified: Secondary | ICD-10-CM | POA: Diagnosis not present

## 2021-08-17 DIAGNOSIS — I63412 Cerebral infarction due to embolism of left middle cerebral artery: Secondary | ICD-10-CM | POA: Diagnosis present

## 2021-08-17 DIAGNOSIS — I509 Heart failure, unspecified: Secondary | ICD-10-CM | POA: Diagnosis not present

## 2021-08-17 DIAGNOSIS — Z20822 Contact with and (suspected) exposure to covid-19: Secondary | ICD-10-CM | POA: Diagnosis present

## 2021-08-17 DIAGNOSIS — R0602 Shortness of breath: Secondary | ICD-10-CM | POA: Diagnosis not present

## 2021-08-17 DIAGNOSIS — Z825 Family history of asthma and other chronic lower respiratory diseases: Secondary | ICD-10-CM

## 2021-08-17 DIAGNOSIS — I130 Hypertensive heart and chronic kidney disease with heart failure and stage 1 through stage 4 chronic kidney disease, or unspecified chronic kidney disease: Secondary | ICD-10-CM | POA: Diagnosis present

## 2021-08-17 DIAGNOSIS — M7989 Other specified soft tissue disorders: Secondary | ICD-10-CM | POA: Diagnosis not present

## 2021-08-17 DIAGNOSIS — Z7901 Long term (current) use of anticoagulants: Secondary | ICD-10-CM | POA: Diagnosis not present

## 2021-08-17 DIAGNOSIS — Z800 Family history of malignant neoplasm of digestive organs: Secondary | ICD-10-CM | POA: Diagnosis not present

## 2021-08-17 DIAGNOSIS — I2510 Atherosclerotic heart disease of native coronary artery without angina pectoris: Secondary | ICD-10-CM | POA: Diagnosis present

## 2021-08-17 DIAGNOSIS — Z79899 Other long term (current) drug therapy: Secondary | ICD-10-CM

## 2021-08-17 DIAGNOSIS — I272 Pulmonary hypertension, unspecified: Secondary | ICD-10-CM | POA: Diagnosis not present

## 2021-08-17 DIAGNOSIS — Z96653 Presence of artificial knee joint, bilateral: Secondary | ICD-10-CM | POA: Diagnosis present

## 2021-08-17 DIAGNOSIS — N183 Chronic kidney disease, stage 3 unspecified: Secondary | ICD-10-CM | POA: Diagnosis present

## 2021-08-17 DIAGNOSIS — K219 Gastro-esophageal reflux disease without esophagitis: Secondary | ICD-10-CM | POA: Diagnosis present

## 2021-08-17 DIAGNOSIS — I2583 Coronary atherosclerosis due to lipid rich plaque: Secondary | ICD-10-CM | POA: Diagnosis not present

## 2021-08-17 DIAGNOSIS — Z923 Personal history of irradiation: Secondary | ICD-10-CM | POA: Diagnosis not present

## 2021-08-17 DIAGNOSIS — Z888 Allergy status to other drugs, medicaments and biological substances status: Secondary | ICD-10-CM

## 2021-08-17 DIAGNOSIS — J90 Pleural effusion, not elsewhere classified: Secondary | ICD-10-CM | POA: Diagnosis not present

## 2021-08-17 DIAGNOSIS — R0609 Other forms of dyspnea: Secondary | ICD-10-CM | POA: Diagnosis not present

## 2021-08-17 DIAGNOSIS — I340 Nonrheumatic mitral (valve) insufficiency: Secondary | ICD-10-CM | POA: Diagnosis present

## 2021-08-17 DIAGNOSIS — J9610 Chronic respiratory failure, unspecified whether with hypoxia or hypercapnia: Secondary | ICD-10-CM | POA: Diagnosis not present

## 2021-08-17 DIAGNOSIS — I110 Hypertensive heart disease with heart failure: Secondary | ICD-10-CM | POA: Diagnosis not present

## 2021-08-17 DIAGNOSIS — I10 Essential (primary) hypertension: Secondary | ICD-10-CM | POA: Diagnosis present

## 2021-08-17 DIAGNOSIS — Z87891 Personal history of nicotine dependence: Secondary | ICD-10-CM

## 2021-08-17 DIAGNOSIS — Z853 Personal history of malignant neoplasm of breast: Secondary | ICD-10-CM | POA: Diagnosis not present

## 2021-08-17 DIAGNOSIS — Z823 Family history of stroke: Secondary | ICD-10-CM | POA: Diagnosis not present

## 2021-08-17 DIAGNOSIS — I5030 Unspecified diastolic (congestive) heart failure: Secondary | ICD-10-CM | POA: Diagnosis present

## 2021-08-17 DIAGNOSIS — M19079 Primary osteoarthritis, unspecified ankle and foot: Secondary | ICD-10-CM | POA: Diagnosis present

## 2021-08-17 DIAGNOSIS — I4821 Permanent atrial fibrillation: Secondary | ICD-10-CM | POA: Diagnosis present

## 2021-08-17 DIAGNOSIS — I517 Cardiomegaly: Secondary | ICD-10-CM | POA: Diagnosis not present

## 2021-08-17 DIAGNOSIS — Z881 Allergy status to other antibiotic agents status: Secondary | ICD-10-CM

## 2021-08-17 DIAGNOSIS — K2100 Gastro-esophageal reflux disease with esophagitis, without bleeding: Secondary | ICD-10-CM

## 2021-08-17 DIAGNOSIS — I5031 Acute diastolic (congestive) heart failure: Secondary | ICD-10-CM | POA: Diagnosis not present

## 2021-08-17 DIAGNOSIS — I5032 Chronic diastolic (congestive) heart failure: Secondary | ICD-10-CM | POA: Diagnosis present

## 2021-08-17 DIAGNOSIS — Z9011 Acquired absence of right breast and nipple: Secondary | ICD-10-CM

## 2021-08-17 DIAGNOSIS — R3914 Feeling of incomplete bladder emptying: Secondary | ICD-10-CM | POA: Diagnosis not present

## 2021-08-17 LAB — BASIC METABOLIC PANEL
Anion gap: 9 (ref 5–15)
BUN: 13 mg/dL (ref 8–23)
CO2: 27 mmol/L (ref 22–32)
Calcium: 9.2 mg/dL (ref 8.9–10.3)
Chloride: 100 mmol/L (ref 98–111)
Creatinine, Ser: 1.01 mg/dL — ABNORMAL HIGH (ref 0.44–1.00)
GFR, Estimated: 53 mL/min — ABNORMAL LOW (ref >60)
Glucose, Bld: 107 mg/dL — ABNORMAL HIGH (ref 70–99)
Potassium: 4.4 mmol/L (ref 3.5–5.1)
Sodium: 136 mmol/L (ref 135–145)

## 2021-08-17 LAB — CBC WITH DIFFERENTIAL/PLATELET
Abs Immature Granulocytes: 0.05 10*3/uL (ref 0.00–0.07)
Basophils Absolute: 0 10*3/uL (ref 0.0–0.1)
Basophils Relative: 0 %
Eosinophils Absolute: 0.1 10*3/uL (ref 0.0–0.5)
Eosinophils Relative: 1 %
HCT: 33.1 % — ABNORMAL LOW (ref 36.0–46.0)
Hemoglobin: 10.5 g/dL — ABNORMAL LOW (ref 12.0–15.0)
Immature Granulocytes: 1 %
Lymphocytes Relative: 12 %
Lymphs Abs: 1.2 10*3/uL (ref 0.7–4.0)
MCH: 30.1 pg (ref 26.0–34.0)
MCHC: 31.7 g/dL (ref 30.0–36.0)
MCV: 94.8 fL (ref 80.0–100.0)
Monocytes Absolute: 0.9 10*3/uL (ref 0.1–1.0)
Monocytes Relative: 9 %
Neutro Abs: 7.9 10*3/uL — ABNORMAL HIGH (ref 1.7–7.7)
Neutrophils Relative %: 77 %
Platelets: 306 10*3/uL (ref 150–400)
RBC: 3.49 MIL/uL — ABNORMAL LOW (ref 3.87–5.11)
RDW: 14.5 % (ref 11.5–15.5)
WBC: 10.2 10*3/uL (ref 4.0–10.5)
nRBC: 0 % (ref 0.0–0.2)

## 2021-08-17 LAB — BRAIN NATRIURETIC PEPTIDE: B Natriuretic Peptide: 546.5 pg/mL — ABNORMAL HIGH (ref 0.0–100.0)

## 2021-08-17 LAB — TROPONIN I (HIGH SENSITIVITY)
Troponin I (High Sensitivity): 20 ng/L — ABNORMAL HIGH (ref <18)
Troponin I (High Sensitivity): 25 ng/L — ABNORMAL HIGH (ref <18)

## 2021-08-17 LAB — RESP PANEL BY RT-PCR (FLU A&B, COVID) ARPGX2
Influenza A by PCR: NEGATIVE
Influenza B by PCR: NEGATIVE
SARS Coronavirus 2 by RT PCR: NEGATIVE

## 2021-08-17 MED ORDER — ALBUTEROL SULFATE (2.5 MG/3ML) 0.083% IN NEBU: 3.0000 mL | INHALATION_SOLUTION | Freq: Four times a day (QID) | RESPIRATORY_TRACT | Status: DC | PRN

## 2021-08-17 MED ORDER — SODIUM CHLORIDE 0.9% FLUSH
3.0000 mL | Freq: Two times a day (BID) | INTRAVENOUS | Status: DC
  Administered 2021-08-17 – 2021-08-19 (×4): 3 mL via INTRAVENOUS

## 2021-08-17 MED ORDER — ATORVASTATIN CALCIUM 10 MG PO TABS
10.0000 mg | ORAL_TABLET | Freq: Every day | ORAL | Status: DC
  Administered 2021-08-18 – 2021-08-19 (×2): 10 mg via ORAL
  Filled 2021-08-17 (×2): qty 1

## 2021-08-17 MED ORDER — METOPROLOL TARTRATE 100 MG PO TABS
100.0000 mg | ORAL_TABLET | Freq: Two times a day (BID) | ORAL | Status: DC
  Administered 2021-08-18 – 2021-08-19 (×3): 100 mg via ORAL
  Filled 2021-08-17: qty 1
  Filled 2021-08-17: qty 4
  Filled 2021-08-17: qty 1

## 2021-08-17 MED ORDER — PANTOPRAZOLE SODIUM 40 MG PO TBEC
40.0000 mg | DELAYED_RELEASE_TABLET | Freq: Every day | ORAL | Status: DC
  Administered 2021-08-18 – 2021-08-19 (×2): 40 mg via ORAL
  Filled 2021-08-17 (×2): qty 1

## 2021-08-17 MED ORDER — ACETAMINOPHEN 650 MG RE SUPP: 650.0000 mg | Freq: Four times a day (QID) | RECTAL | Status: DC | PRN

## 2021-08-17 MED ORDER — FUROSEMIDE 10 MG/ML IJ SOLN
40.0000 mg | Freq: Once | INTRAMUSCULAR | Status: AC
  Administered 2021-08-17: 40 mg via INTRAVENOUS
  Filled 2021-08-17: qty 4

## 2021-08-17 MED ORDER — DILTIAZEM HCL 60 MG PO TABS
60.0000 mg | ORAL_TABLET | Freq: Three times a day (TID) | ORAL | Status: DC
  Administered 2021-08-18 – 2021-08-19 (×4): 60 mg via ORAL
  Filled 2021-08-17 (×7): qty 1

## 2021-08-17 MED ORDER — APIXABAN 5 MG PO TABS
5.0000 mg | ORAL_TABLET | Freq: Two times a day (BID) | ORAL | Status: DC
  Administered 2021-08-18 – 2021-08-19 (×3): 5 mg via ORAL
  Filled 2021-08-17 (×3): qty 1

## 2021-08-17 MED ORDER — ACETAMINOPHEN 325 MG PO TABS: 650.0000 mg | ORAL_TABLET | Freq: Four times a day (QID) | ORAL | Status: DC | PRN

## 2021-08-17 MED ORDER — FUROSEMIDE 10 MG/ML IJ SOLN
40.0000 mg | Freq: Two times a day (BID) | INTRAMUSCULAR | Status: DC
  Administered 2021-08-18 – 2021-08-19 (×3): 40 mg via INTRAVENOUS
  Filled 2021-08-17 (×3): qty 4

## 2021-08-17 NOTE — ED Triage Notes (Signed)
Patient complains of chronic sob but has worsened over the past few days. Patient states that she was sent to be evaluated for CHF . Denies CP. Alert and oriented

## 2021-08-17 NOTE — H&P (Signed)
History and Physical   Adrienne [name] XNA:[id number] DOB: 05-Jun-1928 DOA: 08/17/2021  PCP: Kathyrn Lass, MD   Patient coming from: Home/PCP office  Chief Complaint: Shortness of breath  HPI: Adrienne [name] is a 87 y.o. female with medical history significant of gout, anemia, A. fib, CAD, hyperlipidemia, DDD, CKD 3, history of breast cancer, GERD, indwelling Foley, CVA, GI bleed, mitral valve regurg presenting with worsening shortness of breath.  Patient states that she has some degree of chronic shortness of breath that has been worsening over the last 4 days.  She is had some associated increasing lower extremity edema.  She states the shortness of breath is present at rest but significantly increases with exertion now she has difficulty walking across the room.  She has had no orthopnea and but does note some rhinorrhea recently.  She maybe has felt a little chilled.  She went to be evaluated by her PCP who sent her to the ED with concern for volume overload.  She does take Lasix 20 mg daily.  No formal diagnosis of CHF.  Does follow with cardiology.  Of note, also has chronic indwelling Foley catheter.  She denies fevers, chest pain, abdominal pain, constipation, diarrhea, nausea, vomiting.    ED Course: Vital signs in ED significant for blood pressure in the 542H 062 systolic.  Lab work-up showed BMP with glucose 170.  CBC with hemoglobin of 10.5 down from a baseline of 11-12.  BNP elevated at 546.  Troponin mildly elevated at 20 with repeat pending.  Respiratory panel for flu and COVID-negative.  Chest x-ray showed cardiomegaly with COPD changes as well as increased interstitial prominence suggestive interstitial pneumonia versus interstitial edema.  No focal consolidation and small right pleural effusion noted.  Patient received 40 mg IV Lasix in ED.   Past Medical History:  Diagnosis Date   Afib ([place])    Arthritis of ankle    CAD (coronary artery disease)    Cancer  ([place]) 2012   breast cancer-right   Compression fracture of T12 vertebra (HCC)    Diverticulosis    Edema    Elevated uric acid in blood    GERD (gastroesophageal reflux disease)    GI bleed 12/2015   Hyperlipidemia    Hypertension    Long-term (current) use of anticoagulants    Moderate mitral regurgitation    Personal history of radiation therapy    Pulmonary HTN ([place])    Echo 1/19: EF 60-65, no RWMA, Ao sclerosis, trivial AI, mild MR, mod BAE, mod TR, PASP 49   Stroke ([place]) 01/25/2016   TIA (transient ischemic attack) 09/2011   Urinary incontinence    Urinary retention     Past Surgical History:  Procedure Laterality Date   APPENDECTOMY     AXILLARY SENTINEL NODE BIOPSY Left 01/22/2020   Procedure: Left Axillary Sentinel Node Biopsy;  Surgeon: Rolm Bookbinder, MD;  Location: [facility];  Service: General;  Laterality: Left;   BREAST LUMPECTOMY WITH RADIOACTIVE SEED AND SENTINEL LYMPH NODE BIOPSY Left 01/22/2020   Procedure: LEFT BREAST LUMPECTOMY WITH RADIOACTIVE SEED;  Surgeon: Rolm Bookbinder, MD;  Location: [facility];  Service: General;  Laterality: Left;   BREAST SURGERY  2012   Rt.mastectomy--[place], [place] CATHETERIZATION  2013   CATARACT EXTRACTION, BILATERAL     CERVICAL CONIZATION W/BX N/A 08/08/2020   Procedure: VAGINAL SIDEWALL BIOPSY;  Surgeon: Megan Salon, MD;  Location: [facility];  Service: Gynecology;  Laterality: N/A;   DILATION AND  CURETTAGE OF UTERUS     in her early 70's for DUB   ESOPHAGOGASTRODUODENOSCOPY Left 01/08/2016   Procedure: ESOPHAGOGASTRODUODENOSCOPY (EGD);  Surgeon: Arta Silence, MD;  Location: [facility];  Service: Endoscopy;  Laterality: Left;   GALLBLADDER SURGERY     MASTECTOMY Right 2012   RADIOLOGY WITH ANESTHESIA N/A 01/25/2016   Procedure: RADIOLOGY WITH ANESTHESIA;  Surgeon: Luanne Bras, MD;  Location: [facility];  Service: Radiology;  Laterality: N/A;   REPLACEMENT TOTAL KNEE BILATERAL     TONSILLECTOMY AND ADENOIDECTOMY      as a child    Social History  reports that she quit smoking about 72 years ago. Her smoking use included cigarettes. She has a 0.50 pack-year smoking history. She has never used smokeless tobacco. She reports that she does not drink alcohol and does not use drugs.  Allergies  Allergen Reactions   Valium [Diazepam] Other (See Comments)    HYPERACTIVITY    Amiodarone Hcl [Amiodarone] Other (See Comments)    AFIB   Compazine [Prochlorperazine] Other (See Comments)    HYPERACTIVITY    Other Other (See Comments)    "Kidney dye"--patient states this gave her severe shakes/chills   Thorazine [Chlorpromazine] Other (See Comments)    HYPERACTIVITY    Zometa [Zoledronic Acid] Other (See Comments)    PROBLEMS WITH EYES  TIA   Ciprofloxacin Swelling    "swelling" in Rt.elbow   Doxycycline Nausea Only    Family History  Problem Relation Age of Onset   Asthma Mother    CVA Father    Atrial fibrillation Sister        HAS PACER   Pneumonia Brother    Cancer Daughter        COLON CANCER   Cancer Maternal Grandmother        breast cancer  Reviewed on admission  Prior to Admission medications   Medication Sig Start Date End Date Taking? Authorizing Provider  acetaminophen (TYLENOL) 500 MG tablet Take 500-1,000 mg by mouth every 12 (twelve) hours as needed (pain).     [provider]  albuterol (VENTOLIN HFA) 108 (90 Base) MCG/ACT inhaler Inhale 1 puff into the lungs every 6 (six) hours as needed for wheezing or shortness of breath (Cough). 05/13/20   Martyn Ehrich, NP  amoxicillin (AMOXIL) 500 MG capsule Take 500 mg by mouth.    [provider]  Ascorbic Acid (VITAMIN C WITH ROSE HIPS) 500 MG tablet Take 500 mg by mouth in the morning and at bedtime.    [provider]  atorvastatin (LIPITOR) 10 MG tablet TAKE 1 TABLET DAILY 09/07/20   Jerline Pain, MD  Calcium Citrate-Vitamin D (SM CALCIUM CITRATE+D3 PETITE PO) Take 1 tablet by mouth in the morning and at  bedtime.    [provider]  diltiazem (CARDIZEM) 60 MG tablet TAKE 1 TABLET THREE TIMES A DAY 09/07/20   Jerline Pain, MD  doxycycline (VIBRAMYCIN) 100 MG capsule Take 100 mg by mouth 2 (two) times daily. 08/04/21   [provider]  ELIQUIS 5 MG TABS tablet TAKE 1 TABLET TWICE A DAY 06/04/21   Jerline Pain, MD  furosemide (LASIX) 20 MG tablet Take 1 tablet (20 mg total) by mouth daily. 09/21/20   Jerline Pain, MD  HYDROcodone-acetaminophen (NORCO/VICODIN) 5-325 MG tablet Take 1 tablet by mouth 2 (two) times daily as needed. 08/04/21   [provider]  metoprolol tartrate (LOPRESSOR) 100 MG tablet TAKE 1 TABLET TWICE A DAY 09/07/20  Jerline Pain, MD  Multiple Vitamin (MULTIVITAMIN WITH MINERALS) TABS tablet Take 1 tablet by mouth daily. Centrum for Women 50+    [provider]  omeprazole (PRILOSEC) 10 MG capsule Take 10 mg by mouth every other day. 03/21/19   [provider]  Probiotic Product (UP4 PROBIOTICS WOMENS PO) Take 1 capsule by mouth daily.    [provider]  traMADol (ULTRAM) 50 MG tablet Take 50 mg by mouth 2 (two) times daily as needed. 07/29/21   [provider]    Physical Exam: Vitals:   08/17/21 1711 08/17/21 1745 08/17/21 1800 08/17/21 1835  BP: (!) 167/91 (!) 153/84  (!) 174/80  Pulse: 82 89  86  Resp: 18   17  Temp:      SpO2: 99% 98%  100%  Weight:   65.3 kg   Height:   5' 1.5" (1.562 m)    Physical Exam Constitutional:      General: She is not in acute distress.    Appearance: Normal appearance.  HENT:     Head: Normocephalic and atraumatic.     Mouth/Throat:     Mouth: Mucous membranes are moist.     Pharynx: Oropharynx is clear.  Eyes:     Extraocular Movements: Extraocular movements intact.     Pupils: Pupils are equal, round, and reactive to light.  Cardiovascular:     Rate and Rhythm: Normal rate. Rhythm irregular.     Pulses: Normal pulses.     Heart sounds: Normal heart sounds.   Pulmonary:     Effort: Pulmonary effort is normal. No respiratory distress.     Breath sounds: Rales (trace basilar) present.  Abdominal:     General: Bowel sounds are normal. There is no distension.     Palpations: Abdomen is soft.     Tenderness: There is no abdominal tenderness.  Musculoskeletal:        General: No swelling or deformity.     Right lower leg: Edema present.     Left lower leg: Edema present.  Skin:    General: Skin is warm and dry.  Neurological:     General: No focal deficit present.     Mental Status: Mental status is at baseline.   Labs on Admission: I have personally reviewed following labs and imaging studies  CBC: Recent Labs  Lab 08/17/21 1503  WBC 10.2  NEUTROABS 7.9*  HGB 10.5*  HCT 33.1*  MCV 94.8  PLT 784    Basic Metabolic Panel: Recent Labs  Lab 08/17/21 1503  NA 136  K 4.4  CL 100  CO2 27  GLUCOSE 107*  BUN 13  CREATININE 1.01*  CALCIUM 9.2    GFR: Estimated Creatinine Clearance: 31.8 mL/min (A) (by C-G formula based on SCr of 1.01 mg/dL (H)).  Liver Function Tests: No results for input(s): AST, ALT, ALKPHOS, BILITOT, PROT, ALBUMIN in the last 168 hours.  Urine analysis:    Component Value Date/Time   COLORURINE YELLOW 08/29/2017 2343   APPEARANCEUR CLOUDY (A) 08/29/2017 2343   LABSPEC 1.005 08/29/2017 2343   PHURINE 6.0 08/29/2017 2343   GLUCOSEU NEGATIVE 08/29/2017 2343   HGBUR SMALL (A) 08/29/2017 2343   BILIRUBINUR n 04/14/2020 [place] 08/29/2017 2343   PROTEINUR Negative 04/14/2020 1705   PROTEINUR NEGATIVE 08/29/2017 2343   UROBILINOGEN 0.2 04/14/2020 1705   NITRITE n 04/14/2020 1705   NITRITE NEGATIVE 08/29/2017 2343   LEUKOCYTESUR Negative 04/14/2020 1705    Radiological  Exams on Admission: DG Chest 2 View  Result Date: 08/17/2021 CLINICAL DATA:  Shortness of breath EXAM: CHEST - 2 VIEW COMPARISON:  Previous studies including the examination of 09/08/2020 FINDINGS: Transverse diameter  of heart is increased. Increase in AP diameter of chest suggests COPD. There is prominence of interstitial markings in the parahilar regions and lower lung fields. There is no focal pulmonary consolidation. There is blunting of right lateral CP angle. There is no pneumothorax. IMPRESSION: Cardiomegaly. COPD. There is prominence of interstitial markings in the parahilar regions and lower lung fields suggesting interstitial pneumonia or interstitial edema. Part of this finding may suggest underlying scarring. There is no focal pulmonary consolidation. Small right pleural effusion Electronically Signed   By: Elmer Picker M.D.   On: 08/17/2021 16:10    EKG: Independently reviewed.  Atrial fibrillation at 91 bpm.  Nonspecific intraventricular conduction delay possible left bundle branch block.  Similar to previous.  Assessment/Plan Principal Problem:   Volume overload Active Problems:   Pulmonary HTN (HCC)   Chronic atrial fibrillation (HCC)   Hyperlipidemia   Essential hypertension   Coronary artery disease due to lipid rich plaque   GERD (gastroesophageal reflux disease)   Cerebrovascular accident (CVA) due to embolism of left middle cerebral artery (HCC)   Chronic respiratory failure (HCC)  Shortness of breath ?Acute CHF exacerbation > Patient with worsening shortness of breath and lower extremity edema with BNP elevated at 546.  Is on daily Lasix but no diagnosis of CHF . > Last echo showed EF 55-60% with indeterminate diastolic function with moderate mitral regurgitation.  Could be a degree of diastolic heart failure at this point. > No other etiology for shortness of breath identified thus far. Does have mitral regurg and pHTN history as well. > Received 40 mg IV Lasix in ED. - Monitor on telemetry - Echocardiogram - Continue with Lasix 40 mg twice daily for now - Trend renal function electrolytes - Strict I's and O's, daily weights  A. fib CAD HTN Hyperlipidemia - Continue  home diltiazem, metoprolol, Eliquis, atorvastatin  GERD - Continue home PPI  History of CVA - Continue atorvastatin and Eliquis  COPD - Continue home albuterol  DVT prophylaxis: Eliquis Code Status:   Full  Family Communication:  Family updated at bedside. Disposition Plan:   Patient is from:  Home  Anticipated DC to:  Home  Anticipated DC date:  1 to 3 days  Anticipated DC barriers: None  Consults called:  None Admission status:  Observation, telemetry   Severity of Illness: The appropriate patient status for this patient is OBSERVATION. Observation status is judged to be reasonable and necessary in order to provide the required intensity of service to ensure the patient's safety. The patient's presenting symptoms, physical exam findings, and initial radiographic and laboratory data in the context of their medical condition is felt to place them at decreased risk for further clinical deterioration. Furthermore, it is anticipated that the patient will be medically stable for discharge from the hospital within 2 midnights of admission.    Marcelyn Bruins MD [facility]  How to contact the [facility] Attending or Consulting provider [place] or covering provider during after hours [place], for this patient?   Check the care team in [facility] and look for a) attending/consulting [facility] provider listed and b) the [facility] team listed Log into [email or web address] and use [facility]'s universal password to access. If you do not have the password, please contact the hospital operator. Locate  the [facility] provider you are looking for under [facility] and page to a number that you can be directly reached. If you still have difficulty reaching the provider, please page the [facility] (Director on Call) for the Hospitalists listed on amion for assistance.  08/17/2021, 6:38 PM

## 2021-08-17 NOTE — ED Provider Notes (Signed)
[facility] EMERGENCY DEPARTMENT Provider Note   CSN: [id number] Arrival date & time: 08/17/21  1433     History Chief Complaint  Patient presents with   Shortness of Breath    Adrienne [name] is a 87 y.o. female.  Patient is a 87 year old female with a history of pulmonary hypertension, stroke, chronic indwelling Foley catheter, hypertension, CAD and A. fib on Eliquis who is presenting today with a complaint of shortness of breath.  Patient reports that she always has chronic shortness of breath but for the last 4 days she has had worsening shortness of breath.  She notices it at rest but it is much worse with any exertion.  Now she can barely get up and walk from 1 room to the other before she becomes winded.  She does not have significant orthopnea.  She has noted worsening swelling in her lower extremities as well.  She has had some runny nose over the same period of time and felt slightly chilled but denies any fever, cough or sore throat.  No known sick contacts.  She has had poor appetite but denies any chest pain, abdominal pain, vomiting.  She has not missed any doses of her diuretic which she takes 20 of Lasix daily.  Approximately 1 month ago she had fallen and hurt her leg and had to be on antibiotics, pain medication and muscle relaxers but since has discontinued all of those medications.  She has had no recent change in her cardiac medications.  The history is provided by the patient, medical records and the spouse.      Past Medical History:  Diagnosis Date   Afib ([place])    Arthritis of ankle    CAD (coronary artery disease)    Cancer ([place]) 2012   breast cancer-right   Compression fracture of T12 vertebra (HCC)    Diverticulosis    Edema    Elevated uric acid in blood    GERD (gastroesophageal reflux disease)    GI bleed 12/2015   Hyperlipidemia    Hypertension    Long-term (current) use of anticoagulants    Moderate mitral regurgitation     Personal history of radiation therapy    Pulmonary HTN ([place])    Echo 1/19: EF 60-65, no RWMA, Ao sclerosis, trivial AI, mild MR, mod BAE, mod TR, PASP 49   Stroke ([place]) 01/25/2016   TIA (transient ischemic attack) 09/2011   Urinary incontinence    Urinary retention     Patient Active Problem List   Diagnosis Date Noted   Palliative care by specialist    Goals of care, counseling/discussion    DNR (do not resuscitate) discussion    Vaginal bleeding 08/08/2020   Uterine prolapse 08/08/2020   Vaginal erosion secondary to pessary use ([place]) 08/08/2020   Acute urinary retention 08/08/2020   Chronic respiratory failure ([place]) 08/08/2020   Multiple pulmonary nodules 05/24/2020   Chronic cough 05/24/2020   Malignant neoplasm of upper-inner quadrant of left breast in female, estrogen receptor negative ([place]) 12/20/2019   Malignant neoplasm of overlapping sites of right breast in female, estrogen receptor positive ([place]) 08/14/2019   Osteopenia 08/14/2019   Diplopia 01/29/2016   GERD (gastroesophageal reflux disease) 01/29/2016   Cerebrovascular accident (CVA) due to embolism of left middle cerebral artery ([place]) 01/29/2016   Persistent atrial fibrillation (HCC)    Bleeding gastrointestinal    Gait disturbance, post-stroke 01/27/2016   Fall    Secondary hypertension, unspecified    Cerebral  infarction due to thrombosis of left middle cerebral artery (HCC) s/p mechanical thrombectomy    Malnutrition of moderate degree 01/09/2016   GI bleed 01/06/2016   Chronic atrial fibrillation ([place]) 06/10/2015   Chronic anticoagulation 06/10/2015   Hyperlipidemia 06/10/2015   Essential hypertension 06/10/2015   History of stroke 06/10/2015   Coronary artery disease due to lipid rich plaque 06/10/2015   Elevated uric acid in blood    Arthritis of ankle    Pulmonary HTN ([place])     Past Surgical History:  Procedure Laterality Date   APPENDECTOMY     AXILLARY SENTINEL NODE BIOPSY Left 01/22/2020    Procedure: Left Axillary Sentinel Node Biopsy;  Surgeon: Rolm Bookbinder, MD;  Location: [place];  Service: General;  Laterality: Left;   BREAST LUMPECTOMY WITH RADIOACTIVE SEED AND SENTINEL LYMPH NODE BIOPSY Left 01/22/2020   Procedure: LEFT BREAST LUMPECTOMY WITH RADIOACTIVE SEED;  Surgeon: Rolm Bookbinder, MD;  Location: [place];  Service: General;  Laterality: Left;   BREAST SURGERY  2012   Rt.mastectomy--[place], [place] CATHETERIZATION  2013   CATARACT EXTRACTION, BILATERAL     CERVICAL CONIZATION W/BX N/A 08/08/2020   Procedure: VAGINAL SIDEWALL BIOPSY;  Surgeon: Megan Salon, MD;  Location: [place];  Service: Gynecology;  Laterality: N/A;   DILATION AND CURETTAGE OF UTERUS     in her early 67's for DUB   ESOPHAGOGASTRODUODENOSCOPY Left 01/08/2016   Procedure: ESOPHAGOGASTRODUODENOSCOPY (EGD);  Surgeon: Arta Silence, MD;  Location: [facility];  Service: Endoscopy;  Laterality: Left;   GALLBLADDER SURGERY     MASTECTOMY Right 2012   RADIOLOGY WITH ANESTHESIA N/A 01/25/2016   Procedure: RADIOLOGY WITH ANESTHESIA;  Surgeon: Luanne Bras, MD;  Location: [place];  Service: Radiology;  Laterality: N/A;   REPLACEMENT TOTAL KNEE BILATERAL     TONSILLECTOMY AND ADENOIDECTOMY     as a child     OB History     Gravida  3   Para  3   Term  3   Preterm      AB      Living  3      SAB      IAB      Ectopic      Multiple      Live Births              Family History  Problem Relation Age of Onset   Asthma Mother    CVA Father    Atrial fibrillation Sister        HAS PACER   Pneumonia Brother    Cancer Daughter        COLON CANCER   Cancer Maternal Grandmother        breast cancer    Social History   Tobacco Use   Smoking status: Former    Packs/day: 0.25    Years: 2.00    Pack years: 0.50    Types: Cigarettes    Quit date: 1950    Years since quitting: 72.9   Smokeless tobacco: Never  Vaping Use   Vaping Use: Never used  Substance Use  Topics   Alcohol use: No    Alcohol/week: 0.0 standard drinks   Drug use: No    Home Medications Prior to Admission medications   Medication Sig Start Date End Date Taking? Authorizing Provider  acetaminophen (TYLENOL) 500 MG tablet Take 500-1,000 mg by mouth every 12 (twelve) hours as needed (pain).     [provider]  albuterol (VENTOLIN HFA) 108 (90 Base) MCG/ACT inhaler Inhale 1 puff into the lungs every 6 (six) hours as needed for wheezing or shortness of breath (Cough). 05/13/20   Martyn Ehrich, NP  amoxicillin (AMOXIL) 500 MG capsule Take 500 mg by mouth.    [provider]  Ascorbic Acid (VITAMIN C WITH ROSE HIPS) 500 MG tablet Take 500 mg by mouth in the morning and at bedtime.    [provider]  atorvastatin (LIPITOR) 10 MG tablet TAKE 1 TABLET DAILY 09/07/20   Jerline Pain, MD  Calcium Citrate-Vitamin D (SM CALCIUM CITRATE+D3 PETITE PO) Take 1 tablet by mouth in the morning and at bedtime.    [provider]  diltiazem (CARDIZEM) 60 MG tablet TAKE 1 TABLET THREE TIMES A DAY 09/07/20   Jerline Pain, MD  doxycycline (VIBRAMYCIN) 100 MG capsule Take 100 mg by mouth 2 (two) times daily. 08/04/21   [provider]  ELIQUIS 5 MG TABS tablet TAKE 1 TABLET TWICE A DAY 06/04/21   Jerline Pain, MD  furosemide (LASIX) 20 MG tablet Take 1 tablet (20 mg total) by mouth daily. 09/21/20   Jerline Pain, MD  HYDROcodone-acetaminophen (NORCO/VICODIN) 5-325 MG tablet Take 1 tablet by mouth 2 (two) times daily as needed. 08/04/21   [provider]  metoprolol tartrate (LOPRESSOR) 100 MG tablet TAKE 1 TABLET TWICE A DAY 09/07/20   Jerline Pain, MD  Multiple Vitamin (MULTIVITAMIN WITH MINERALS) TABS tablet Take 1 tablet by mouth daily. Centrum for Women 50+    [provider]  omeprazole (PRILOSEC) 10 MG capsule Take 10 mg by mouth every other day. 03/21/19   [provider]  Probiotic Product (UP4 PROBIOTICS WOMENS PO)  Take 1 capsule by mouth daily.    [provider]  traMADol (ULTRAM) 50 MG tablet Take 50 mg by mouth 2 (two) times daily as needed. 07/29/21   [provider]    Allergies    Valium [diazepam], Amiodarone hcl [amiodarone], Compazine [prochlorperazine], Other, Thorazine [chlorpromazine], Zometa [zoledronic acid], Ciprofloxacin, and Doxycycline  Review of Systems   Review of Systems  All other systems reviewed and are negative.  Physical Exam Updated Vital Signs BP (!) 153/84   Pulse 89   Temp 98 F (36.7 C)   Resp 18   Ht 5' 1.5" (1.562 m)   Wt 65.3 kg   LMP 09/12/1973 (Approximate)   SpO2 98%   BMI 26.76 kg/m   Physical Exam Vitals and nursing note reviewed.  Constitutional:      General: She is not in acute distress.    Appearance: She is well-developed.  HENT:     Head: Normocephalic and atraumatic.     Mouth/Throat:     Mouth: Mucous membranes are moist.  Eyes:     Pupils: Pupils are equal, round, and reactive to light.  Cardiovascular:     Rate and Rhythm: Normal rate and regular rhythm.     Pulses: Normal pulses.     Heart sounds: Normal heart sounds. No murmur heard.   No friction rub.  Pulmonary:     Effort: Tachypnea present.     Breath sounds: Normal breath sounds. No wheezing or rales.  Abdominal:     General: Bowel sounds are normal. There is no distension.     Palpations: Abdomen is soft.     Tenderness: There is no abdominal tenderness. There is no guarding or rebound.  Musculoskeletal:  General: No tenderness. Normal range of motion.     Cervical back: Normal range of motion and neck supple.     Right lower leg: Edema present.     Left lower leg: Edema present.     Comments: Bilateral pitting lower extremity edema left greater than right up to the knee.  Patient's Foley catheter bag is draining urine.  Skin:    General: Skin is warm and dry.     Findings: No rash.  Neurological:     Mental Status: She is alert and  oriented to person, place, and time. Mental status is at baseline.     Cranial Nerves: No cranial nerve deficit.  Psychiatric:        Mood and Affect: Mood normal.        Behavior: Behavior normal.    ED Results / Procedures / Treatments   Labs (all labs ordered are listed, but only abnormal results are displayed) Labs Reviewed  BASIC METABOLIC PANEL - Abnormal; Notable for the following components:      Result Value   Glucose, Bld 107 (*)    Creatinine, Ser 1.01 (*)    GFR, Estimated 53 (*)    All other components within normal limits  BRAIN NATRIURETIC PEPTIDE - Abnormal; Notable for the following components:   B Natriuretic Peptide 546.5 (*)    All other components within normal limits  CBC WITH DIFFERENTIAL/PLATELET - Abnormal; Notable for the following components:   RBC 3.49 (*)    Hemoglobin 10.5 (*)    HCT 33.1 (*)    Neutro Abs 7.9 (*)    All other components within normal limits  TROPONIN I (HIGH SENSITIVITY) - Abnormal; Notable for the following components:   Troponin I (High Sensitivity) 20 (*)    All other components within normal limits  RESP PANEL BY RT-PCR (FLU A&B, COVID) [id number]  TROPONIN I (HIGH SENSITIVITY)    EKG EKG Interpretation  Date/Time:  Tuesday August 17 2021 14:47:34 EST Ventricular Rate:  91 PR Interval:    QRS Duration: 134 QT Interval:  418 QTC Calculation: 514 R Axis:   168 Text Interpretation: Atrial fibrillation Atrial fibrillation Non-specific intra-ventricular conduction block Cannot rule out Septal infarct , age undetermined Lateral infarct , age undetermined No significant change since last tracing Confirmed by Blanchie Dessert [phone]) on 08/17/2021 4:07:07 PM  Radiology DG Chest 2 View  Result Date: 08/17/2021 CLINICAL DATA:  Shortness of breath EXAM: CHEST - 2 VIEW COMPARISON:  Previous studies including the examination of 09/08/2020 FINDINGS: Transverse diameter of heart is increased. Increase in AP diameter of chest suggests  COPD. There is prominence of interstitial markings in the parahilar regions and lower lung fields. There is no focal pulmonary consolidation. There is blunting of right lateral CP angle. There is no pneumothorax. IMPRESSION: Cardiomegaly. COPD. There is prominence of interstitial markings in the parahilar regions and lower lung fields suggesting interstitial pneumonia or interstitial edema. Part of this finding may suggest underlying scarring. There is no focal pulmonary consolidation. Small right pleural effusion Electronically Signed   By: Elmer Picker M.D.   On: 08/17/2021 16:10    Procedures Procedures   Medications Ordered in ED Medications  furosemide (LASIX) injection 40 mg (has no administration in time range)    ED Course  I have reviewed the triage vital signs and the nursing notes.  Pertinent labs & imaging results that were available during my care of the patient were reviewed by me and considered in my  medical decision making (see chart for details).    MDM Rules/Calculators/A&P                           Patient is an elderly female with multiple medical problems presenting today with shortness of breath over the last 4 days above her baseline.  She does complain of a slightly runny nose but denies any cough or fever.  She is afebrile here.  She does have evidence of fluid overload on exam today and concern for CHF despite her taking her 20 mg of Lasix daily however concern for infectious etiology.  She denies any chest pain at this time. Patient's EKG shows atrial fibrillation today without significant changes.  No evidence of RVR.  Chest x-ray does show cardiomegaly, COPD and some interstitial markings which could be suggestive of pneumonia versus edema.  6:15 PM BNP is elevated today greater than 500 with normal renal function.  No evidence of leukocytosis or anemia today.  Troponin mildly bumped which feels related to strain.  Given patient's significant shortness of  breath and even being tachypneic in the bed will give IV Lasix and admit for diuresis.  MDM   Amount and/or Complexity of Data Reviewed Clinical lab tests: ordered and reviewed Tests in the radiology section of CPT: ordered and reviewed Tests in the medicine section of CPT: ordered and reviewed Independent visualization of images, tracings, or specimens: yes   CRITICAL CARE Performed by: Alica Shellhammer Total critical care time: 30 minutes Critical care time was exclusive of separately billable procedures and treating other patients. Critical care was necessary to treat or prevent imminent or life-threatening deterioration. Critical care was time spent personally by me on the following activities: development of treatment plan with patient and/or surrogate as well as nursing, discussions with consultants, evaluation of patient's response to treatment, examination of patient, obtaining history from patient or surrogate, ordering and performing treatments and interventions, ordering and review of laboratory studies, ordering and review of radiographic studies, pulse oximetry and re-evaluation of patient's condition.    Final Clinical Impression(s) / ED Diagnoses Final diagnoses:  Acute congestive heart failure, unspecified heart failure type [facility])    Rx / DC Orders ED Discharge Orders     None        Blanchie Dessert, MD 08/17/21 1816

## 2021-08-17 NOTE — ED Notes (Signed)
Urethral cath leg bag changed to large drainage bag.

## 2021-08-17 NOTE — ED Provider Notes (Signed)
Emergency Medicine Provider Triage Evaluation Note  Adrienne [name] , a 87 y.o. female  was evaluated in triage.  Pt complains of shortness of breath.  Sent by her PCP with concern for CHF exacerbation, has chronic shortness of breath but is worsened over the weekend and with that she has had some worsening swelling in her lower extremities.  Denies chest pain.  PCP sent her in for further evaluation, was concern for fluid overload.  Patient also reports some chills but no cough  Review of Systems  Positive: Shortness of breath, lower extremity swelling, chills Negative: Chest pain, abdominal pain, fever  Physical Exam  BP (!) 175/95 (BP Location: Right Arm)   Pulse 95   Temp 98 F (36.7 C)   Resp 19   LMP 09/12/1973 (Approximate)   SpO2 99%  Gen:   Awake, no distress   Resp:  Normal effort, faint crackles in the bases MSK:   Moves extremities without difficulty  Other:    Medical Decision Making  Medically screening exam initiated at 2:53 PM.  Appropriate orders placed.  Adrienne [name] was informed that the remainder of the evaluation will be completed by another provider, this initial triage assessment does not replace that evaluation, and the importance of remaining in the ED until their evaluation is complete.     Jacqlyn Larsen, PA-C 08/17/21 1459    Lennice Sites, DO 08/17/21 1507

## 2021-08-18 ENCOUNTER — Observation Stay (HOSPITAL_COMMUNITY): Payer: Medicare Other

## 2021-08-18 DIAGNOSIS — J449 Chronic obstructive pulmonary disease, unspecified: Secondary | ICD-10-CM | POA: Diagnosis present

## 2021-08-18 DIAGNOSIS — I5032 Chronic diastolic (congestive) heart failure: Secondary | ICD-10-CM | POA: Diagnosis present

## 2021-08-18 DIAGNOSIS — R0609 Other forms of dyspnea: Secondary | ICD-10-CM | POA: Diagnosis not present

## 2021-08-18 DIAGNOSIS — E8770 Fluid overload, unspecified: Secondary | ICD-10-CM | POA: Diagnosis present

## 2021-08-18 DIAGNOSIS — K219 Gastro-esophageal reflux disease without esophagitis: Secondary | ICD-10-CM | POA: Diagnosis present

## 2021-08-18 DIAGNOSIS — Z923 Personal history of irradiation: Secondary | ICD-10-CM | POA: Diagnosis not present

## 2021-08-18 DIAGNOSIS — I340 Nonrheumatic mitral (valve) insufficiency: Secondary | ICD-10-CM | POA: Diagnosis present

## 2021-08-18 DIAGNOSIS — N183 Chronic kidney disease, stage 3 unspecified: Secondary | ICD-10-CM | POA: Diagnosis present

## 2021-08-18 DIAGNOSIS — I63412 Cerebral infarction due to embolism of left middle cerebral artery: Secondary | ICD-10-CM | POA: Diagnosis not present

## 2021-08-18 DIAGNOSIS — I272 Pulmonary hypertension, unspecified: Secondary | ICD-10-CM | POA: Diagnosis present

## 2021-08-18 DIAGNOSIS — I10 Essential (primary) hypertension: Secondary | ICD-10-CM | POA: Diagnosis not present

## 2021-08-18 DIAGNOSIS — M19079 Primary osteoarthritis, unspecified ankle and foot: Secondary | ICD-10-CM | POA: Diagnosis present

## 2021-08-18 DIAGNOSIS — Z87891 Personal history of nicotine dependence: Secondary | ICD-10-CM | POA: Diagnosis not present

## 2021-08-18 DIAGNOSIS — Z853 Personal history of malignant neoplasm of breast: Secondary | ICD-10-CM | POA: Diagnosis not present

## 2021-08-18 DIAGNOSIS — I5030 Unspecified diastolic (congestive) heart failure: Secondary | ICD-10-CM | POA: Diagnosis present

## 2021-08-18 DIAGNOSIS — Z8673 Personal history of transient ischemic attack (TIA), and cerebral infarction without residual deficits: Secondary | ICD-10-CM | POA: Diagnosis not present

## 2021-08-18 DIAGNOSIS — Z7901 Long term (current) use of anticoagulants: Secondary | ICD-10-CM | POA: Diagnosis not present

## 2021-08-18 DIAGNOSIS — Z20822 Contact with and (suspected) exposure to covid-19: Secondary | ICD-10-CM | POA: Diagnosis present

## 2021-08-18 DIAGNOSIS — I5031 Acute diastolic (congestive) heart failure: Secondary | ICD-10-CM

## 2021-08-18 DIAGNOSIS — I5033 Acute on chronic diastolic (congestive) heart failure: Secondary | ICD-10-CM | POA: Diagnosis present

## 2021-08-18 DIAGNOSIS — J9610 Chronic respiratory failure, unspecified whether with hypoxia or hypercapnia: Secondary | ICD-10-CM | POA: Diagnosis present

## 2021-08-18 DIAGNOSIS — Z803 Family history of malignant neoplasm of breast: Secondary | ICD-10-CM | POA: Diagnosis not present

## 2021-08-18 DIAGNOSIS — Z79899 Other long term (current) drug therapy: Secondary | ICD-10-CM | POA: Diagnosis not present

## 2021-08-18 DIAGNOSIS — I509 Heart failure, unspecified: Secondary | ICD-10-CM | POA: Diagnosis not present

## 2021-08-18 DIAGNOSIS — Z800 Family history of malignant neoplasm of digestive organs: Secondary | ICD-10-CM | POA: Diagnosis not present

## 2021-08-18 DIAGNOSIS — I482 Chronic atrial fibrillation, unspecified: Secondary | ICD-10-CM | POA: Diagnosis present

## 2021-08-18 DIAGNOSIS — I2510 Atherosclerotic heart disease of native coronary artery without angina pectoris: Secondary | ICD-10-CM | POA: Diagnosis present

## 2021-08-18 DIAGNOSIS — Z823 Family history of stroke: Secondary | ICD-10-CM | POA: Diagnosis not present

## 2021-08-18 DIAGNOSIS — E785 Hyperlipidemia, unspecified: Secondary | ICD-10-CM | POA: Diagnosis present

## 2021-08-18 DIAGNOSIS — I130 Hypertensive heart and chronic kidney disease with heart failure and stage 1 through stage 4 chronic kidney disease, or unspecified chronic kidney disease: Secondary | ICD-10-CM | POA: Diagnosis present

## 2021-08-18 DIAGNOSIS — Z825 Family history of asthma and other chronic lower respiratory diseases: Secondary | ICD-10-CM | POA: Diagnosis not present

## 2021-08-18 DIAGNOSIS — I2583 Coronary atherosclerosis due to lipid rich plaque: Secondary | ICD-10-CM | POA: Diagnosis present

## 2021-08-18 LAB — BASIC METABOLIC PANEL
Anion gap: 9 (ref 5–15)
BUN: 12 mg/dL (ref 8–23)
CO2: 31 mmol/L (ref 22–32)
Calcium: 8.8 mg/dL — ABNORMAL LOW (ref 8.9–10.3)
Chloride: 97 mmol/L — ABNORMAL LOW (ref 98–111)
Creatinine, Ser: 0.94 mg/dL (ref 0.44–1.00)
GFR, Estimated: 57 mL/min — ABNORMAL LOW (ref >60)
Glucose, Bld: 92 mg/dL (ref 70–99)
Potassium: 3.6 mmol/L (ref 3.5–5.1)
Sodium: 137 mmol/L (ref 135–145)

## 2021-08-18 LAB — CBC
HCT: 30.4 % — ABNORMAL LOW (ref 36.0–46.0)
Hemoglobin: 9.9 g/dL — ABNORMAL LOW (ref 12.0–15.0)
MCH: 30.6 pg (ref 26.0–34.0)
MCHC: 32.6 g/dL (ref 30.0–36.0)
MCV: 93.8 fL (ref 80.0–100.0)
Platelets: 286 10*3/uL (ref 150–400)
RBC: 3.24 MIL/uL — ABNORMAL LOW (ref 3.87–5.11)
RDW: 14.4 % (ref 11.5–15.5)
WBC: 8.5 10*3/uL (ref 4.0–10.5)
nRBC: 0 % (ref 0.0–0.2)

## 2021-08-18 LAB — ECHOCARDIOGRAM COMPLETE
Area-P 1/2: 5.31 cm2
Calc EF: 56.7 %
Height: 61.5 in
MV M vel: 5.03 m/s
MV Peak grad: 101.2 mmHg
Radius: 0.3 cm
S' Lateral: 3.4 cm
Single Plane A2C EF: 59.1 %
Single Plane A4C EF: 52.4 %
Weight: 2303.37 oz

## 2021-08-18 LAB — MAGNESIUM: Magnesium: 2 mg/dL (ref 1.7–2.4)

## 2021-08-18 MED ORDER — CHLORHEXIDINE GLUCONATE CLOTH 2 % EX PADS
6.0000 | MEDICATED_PAD | Freq: Every day | CUTANEOUS | Status: DC
  Administered 2021-08-18: 6 via TOPICAL

## 2021-08-18 MED ORDER — POTASSIUM CHLORIDE CRYS ER 20 MEQ PO TBCR
20.0000 meq | EXTENDED_RELEASE_TABLET | Freq: Every day | ORAL | Status: DC
  Administered 2021-08-18 – 2021-08-19 (×2): 20 meq via ORAL
  Filled 2021-08-18 (×2): qty 1

## 2021-08-18 NOTE — ED Notes (Signed)
Pt sats 86-89% RA while sleeping. Placed pt on 2L [place] for comfort.

## 2021-08-18 NOTE — Progress Notes (Signed)
[facility]  PROGRESS NOTE  Loran Auguste MCN:[id number] DOB: May 25, 1928 DOA: 08/17/2021 PCP: Kathyrn Lass, MD   Brief HPI:   87 year old female with medical history of gout, anemia, A. fib, CAD, hyperlipidemia, CKD stage III, history of breast cancer, GERD, indwelling Foley catheter, CVA, GI bleed, mitral valve surgery presented with worsening shortness of breath.  Patient takes Lasix 20 mg daily at home.  In the ED chest x-ray showed interstitial edema, BNP elevated at 446.  Patient was given Lasix 40 mg IV in the ED with good diuretic response.    Subjective   This morning patient is breathing better, though still requiring oxygen.  Pulse ox on ambulation was still 86 to 88%.  Patient does not use oxygen at home.   Assessment/Plan:    Acute on chronic diastolic CHF -Last echocardiogram showed EF 55 to 60% with indeterminate diastolic function with moderate mitral regurgitation -Repeat echocardiogram has been ordered -Diuresing well with IV Lasix, continue Lasix 40 mg IV twice daily -Net -5.5 L -Strict intake and output -We will follow renal function in a.m. -Start K-Dur 20 mEq p.o. daily  Atrial fibrillation -Heart rate controlled -EKG shows atrial fibrillation -Continue anticoagulation with apixaban -Continue Cardizem 60 mg p.o. 3 times daily  Hyperlipidemia -Continue atorvastatin  History of CVA -Continue atorvastatin, Eliquis  Hypertension -Blood pressure is mildly elevated - continue metoprolol, Cardizem  COPD -Continue albuterol as needed       Medications     apixaban  5 mg Oral BID   atorvastatin  10 mg Oral Daily   diltiazem  60 mg Oral TID   furosemide  40 mg Intravenous BID   metoprolol tartrate  100 mg Oral BID   pantoprazole  40 mg Oral Daily   potassium chloride  20 mEq Oral Daily   sodium chloride flush  3 mL Intravenous Q12H     Data Reviewed:   CBG:  No results for input(s): GLUCAP in the last 168 hours.  SpO2:  94 % O2 Flow Rate (L/min): 2 L/min    Vitals:   08/18/21 1000 08/18/21 1100 08/18/21 1200 08/18/21 1300  BP: (!) 143/64 (!) 129/48 (!) 136/57 (!) 159/99  Pulse: 76 77 (!) 33 70  Resp: 17 (!) 29 18 (!) 26  Temp:      SpO2: 98% 96% 95% 94%  Weight:      Height:         Intake/Output Summary (Last 24 hours) at 08/18/2021 1357 Last data filed at 08/18/2021 1308 Gross per 24 hour  Intake --  Output 5500 ml  Net -5500 ml    12/05 1901 - 12/07 0700 In: -  Out: 3700 [Urine:3700]  Filed Weights   08/17/21 1800  Weight: 65.3 kg    Data Reviewed: Basic Metabolic Panel: Recent Labs  Lab 08/17/21 1503 08/18/21 0500  NA 136 137  K 4.4 3.6  CL 100 97*  CO2 27 31  GLUCOSE 107* 92  BUN 13 12  CREATININE 1.01* 0.94  CALCIUM 9.2 8.8*   Liver Function Tests: No results for input(s): AST, ALT, ALKPHOS, BILITOT, PROT, ALBUMIN in the last 168 hours. No results for input(s): LIPASE, AMYLASE in the last 168 hours. No results for input(s): AMMONIA in the last 168 hours. CBC: Recent Labs  Lab 08/17/21 1503 08/18/21 0500  WBC 10.2 8.5  NEUTROABS 7.9*  --   HGB 10.5* 9.9*  HCT 33.1* 30.4*  MCV 94.8 93.8  PLT 306 286   Cardiac Enzymes:  No results for input(s): CKTOTAL, CKMB, CKMBINDEX, TROPONINI in the last 168 hours. BNP (last 3 results) Recent Labs    09/09/20 1136 08/17/21 1503  BNP 456.1* 546.5*    ProBNP (last 3 results) Recent Labs    11/04/20 1647  PROBNP 2,588*    CBG: No results for input(s): GLUCAP in the last 168 hours.     Radiology Reports  DG Chest 2 View  Result Date: 08/17/2021 CLINICAL DATA:  Shortness of breath EXAM: CHEST - 2 VIEW COMPARISON:  Previous studies including the examination of 09/08/2020 FINDINGS: Transverse diameter of heart is increased. Increase in AP diameter of chest suggests COPD. There is prominence of interstitial markings in the parahilar regions and lower lung fields. There is no focal pulmonary consolidation. There is  blunting of right lateral CP angle. There is no pneumothorax. IMPRESSION: Cardiomegaly. COPD. There is prominence of interstitial markings in the parahilar regions and lower lung fields suggesting interstitial pneumonia or interstitial edema. Part of this finding may suggest underlying scarring. There is no focal pulmonary consolidation. Small right pleural effusion Electronically Signed   By: Elmer Picker M.D.   On: 08/17/2021 16:10   ECHOCARDIOGRAM COMPLETE  Result Date: 08/18/2021    ECHOCARDIOGRAM REPORT   Patient Name:   HANAKO TIPPING Date of Exam: 08/18/2021 Medical Rec #:  [id number]                Height:       61.5 in Accession #:    [id number]               Weight:       144.0 lb Date of Birth:  [birth year]                BSA:          1.652 m Patient Age:    15 years                 BP:           147/63 mmHg Patient Gender: F                        HR:           77 bpm. Exam Location:  Inpatient Procedure: 2D Echo, Cardiac Doppler and Color Doppler Indications:    Dyspnea  History:        Patient has prior history of Echocardiogram examinations, most                 recent 11/30/2020. CAD, TIA, Stroke and Pulmonary HTN,                 Arrythmias:Atrial Fibrillation; Risk Factors:Hypertension and                 Dyslipidemia.  Sonographer:    Bernadene Person RDCS Referring Phys: [id number] [place]  1. Left ventricular ejection fraction, by estimation, is 55 to 60%. The left ventricle has normal function. The left ventricle has no regional wall motion abnormalities. Left ventricular diastolic function could not be evaluated.  2. Right ventricular systolic function is normal. The right ventricular size is mildly enlarged. There is normal pulmonary artery systolic pressure.  3. Left atrial size was moderately dilated.  4. Right atrial size was moderately dilated.  5. The mitral valve is normal in structure. Moderate mitral valve regurgitation. No evidence of mitral  stenosis.  6. The aortic valve is tricuspid.  Aortic valve regurgitation is trivial. Aortic valve sclerosis is present, with no evidence of aortic valve stenosis.  7. The inferior vena cava is normal in size with greater than 50% respiratory variability, suggesting right atrial pressure of 3 mmHg. FINDINGS  Left Ventricle: Left ventricular ejection fraction, by estimation, is 55 to 60%. The left ventricle has normal function. The left ventricle has no regional wall motion abnormalities. The left ventricular internal cavity size was normal in size. There is  no left ventricular hypertrophy. Left ventricular diastolic function could not be evaluated due to atrial fibrillation. Left ventricular diastolic function could not be evaluated. Right Ventricle: The right ventricular size is mildly enlarged. Right ventricular systolic function is normal. There is normal pulmonary artery systolic pressure. The tricuspid regurgitant velocity is 2.87 m/s, and with an assumed right atrial pressure of 3 mmHg, the estimated right ventricular systolic pressure is 56.3 mmHg. Left Atrium: Left atrial size was moderately dilated. Right Atrium: Right atrial size was moderately dilated. Pericardium: There is no evidence of pericardial effusion. Mitral Valve: The mitral valve is normal in structure. Mild mitral annular calcification. Moderate mitral valve regurgitation. No evidence of mitral valve stenosis. Tricuspid Valve: The tricuspid valve is normal in structure. Tricuspid valve regurgitation is mild . No evidence of tricuspid stenosis. Aortic Valve: The aortic valve is tricuspid. Aortic valve regurgitation is trivial. Aortic valve sclerosis is present, with no evidence of aortic valve stenosis. Pulmonic Valve: The pulmonic valve was normal in structure. Pulmonic valve regurgitation is not visualized. No evidence of pulmonic stenosis. Aorta: The aortic root is normal in size and structure. Venous: The inferior vena cava is normal in size  with greater than 50% respiratory variability, suggesting right atrial pressure of 3 mmHg. IAS/Shunts: No atrial level shunt detected by color flow Doppler.  LEFT VENTRICLE PLAX 2D LVIDd:         5.00 cm      Diastology LVIDs:         3.40 cm      LV e' medial:    6.53 cm/s LV PW:         0.90 cm      LV E/e' medial:  21.7 LV IVS:        0.90 cm      LV e' lateral:   8.70 cm/s LVOT diam:     2.00 cm      LV E/e' lateral: 16.3 LV SV:         94 LV SV Index:   57 LVOT Area:     3.14 cm  LV Volumes (MOD) LV vol d, MOD A2C: 102.0 ml LV vol d, MOD A4C: 102.0 ml LV vol s, MOD A2C: 41.7 ml LV vol s, MOD A4C: 48.6 ml LV SV MOD A2C:     60.3 ml LV SV MOD A4C:     102.0 ml LV SV MOD BP:      58.9 ml RIGHT VENTRICLE RV S prime:     7.35 cm/s TAPSE (M-mode): 1.8 cm LEFT ATRIUM             Index        RIGHT ATRIUM           Index LA diam:        4.90 cm 2.97 cm/m   RA Area:     16.00 cm LA Vol (A2C):   63.0 ml 38.13 ml/m  RA Volume:   36.10 ml  21.85 ml/m LA Vol (A4C):  62.4 ml 37.76 ml/m LA Biplane Vol: 64.8 ml 39.21 ml/m  AORTIC VALVE LVOT Vmax:   147.00 cm/s LVOT Vmean:  100.000 cm/s LVOT VTI:    0.298 m  AORTA Ao Root diam: 3.20 cm Ao Asc diam:  3.40 cm MITRAL VALVE                  TRICUSPID VALVE MV Area (PHT): 5.31 cm       TR Peak grad:   32.9 mmHg MV Decel Time: 143 msec       TR Vmax:        287.00 cm/s MR Peak grad:    101.2 mmHg MR Mean grad:    76.0 mmHg    SHUNTS MR Vmax:         503.00 cm/s  Systemic VTI:  0.30 m MR Vmean:        424.0 cm/s   Systemic Diam: 2.00 cm MR PISA:         0.57 cm MR PISA Eff ROA: 4 mm MR PISA Radius:  0.30 cm MV E velocity: 142.00 cm/s MV A velocity: 60.60 cm/s MV E/A ratio:  2.34 Kirk Ruths MD Electronically signed by Kirk Ruths MD Signature Date/Time: 08/18/2021/12:51:53 PM    Final        Antibiotics: Anti-infectives (From admission, onward)    None         DVT prophylaxis: Apixaban  Code Status: Full code  Family Communication: No family at  bedside   Consultants:   Procedures:     Objective    Physical Examination:   General-appears in no acute distress Heart-S1-S2, regular, no murmur auscultated Lungs-clear to auscultation bilaterally, no wheezing or crackles auscultated Abdomen-soft, nontender, no organomegaly Extremities-trace edema in the lower extremities Neuro-alert, oriented x3, no focal deficit noted  Status is: Inpatient  Dispo: The patient is from: Home              Anticipated d/c is to: Home              Anticipated d/c date is: 08/19/2021              Patient currently not stable for discharge  Barrier to discharge-CHF exacerbation  COVID-19 Labs  No results for input(s): DDIMER, FERRITIN, LDH, CRP in the last 72 hours.  Lab Results  Component Value Date   SARSCOV2NAA NEGATIVE 08/17/2021   SARSCOV2NAA NEGATIVE 09/09/2020   [place] NEGATIVE 08/08/2020   [place] NEGATIVE 01/18/2020            Recent Results (from the past 240 hour(s))  Resp Panel by RT-PCR (Flu A&B, Covid) Nasopharyngeal Swab     Status: None   Collection Time: 08/17/21  2:55 PM   Specimen: Nasopharyngeal Swab; Nasopharyngeal(NP) swabs in vial transport medium  Result Value Ref Range Status   SARS Coronavirus 2 by RT PCR NEGATIVE NEGATIVE Final    Comment: (NOTE) SARS-CoV-2 target nucleic acids are NOT DETECTED.  The SARS-CoV-2 RNA is generally detectable in upper respiratory specimens during the acute phase of infection. The lowest concentration of SARS-CoV-2 viral copies this assay can detect is 138 copies/mL. A negative result does not preclude SARS-Cov-2 infection and should not be used as the sole basis for treatment or other patient management decisions. A negative result may occur with  improper specimen collection/handling, submission of specimen other than nasopharyngeal swab, presence of viral mutation(s) within the areas targeted by this assay, and inadequate number of viral copies(<138  copies/mL). A  negative result must be combined with clinical observations, patient history, and epidemiological information. The expected result is Negative.  Fact Sheet for Patients:  EntrepreneurPulse.com.au  Fact Sheet for Healthcare Providers:  IncredibleEmployment.be  This test is no t yet approved or cleared by the [place] FDA and  has been authorized for detection and/or diagnosis of SARS-CoV-2 by FDA under an Emergency Use Authorization (EUA). This EUA will remain  in effect (meaning this test can be used) for the duration of the COVID-19 declaration under Section 564(b)(1) of the Act, 21 U.S.C.section 360bbb-3(b)(1), unless the authorization is terminated  or revoked sooner.       Influenza A by PCR NEGATIVE NEGATIVE Final   Influenza B by PCR NEGATIVE NEGATIVE Final    Comment: (NOTE) The Xpert Xpress SARS-CoV-2/FLU/RSV plus assay is intended as an aid in the diagnosis of influenza from Nasopharyngeal swab specimens and should not be used as a sole basis for treatment. Nasal washings and aspirates are unacceptable for Xpert Xpress SARS-CoV-2/FLU/RSV testing.  Fact Sheet for Patients: EntrepreneurPulse.com.au  Fact Sheet for Healthcare Providers: IncredibleEmployment.be  This test is not yet approved or cleared by the [place] FDA and has been authorized for detection and/or diagnosis of SARS-CoV-2 by FDA under an Emergency Use Authorization (EUA). This EUA will remain in effect (meaning this test can be used) for the duration of the COVID-19 declaration under Section 564(b)(1) of the Act, 21 U.S.C. section 360bbb-3(b)(1), unless the authorization is terminated or revoked.  Performed at [facility], [place] [address], [place], [place] [address]     [facility] If 7PM-7AM, please contact night-coverage at [email or web address], Office   [phone]   08/18/2021, 1:57 PM  LOS: 0 days

## 2021-08-18 NOTE — Plan of Care (Signed)
?  Problem: Education: ?Goal: Ability to demonstrate management of disease process will improve ?Outcome: Progressing ?  ?Problem: Education: ?Goal: Ability to verbalize understanding of medication therapies will improve ?Outcome: Progressing ?  ?Problem: Cardiac: ?Goal: Ability to achieve and maintain adequate cardiopulmonary perfusion will improve ?Outcome: Progressing ?  ?

## 2021-08-18 NOTE — Progress Notes (Signed)
Patient came to the floor around 1800 alert and oriented, denies pain. Chronic foley cath that was change at the doctor office yesterday per patient, foley bag change in ED last night. Will continue to monitor.

## 2021-08-18 NOTE — ED Notes (Signed)
Pt was able to ambulate to and from restroom with pulse ox ranging from 86 to 88% on room air. No c/o of pain; did voice c/o of feeling shortness of breath. Nurse notified.

## 2021-08-18 NOTE — ED Notes (Signed)
Breakfast orders placed 

## 2021-08-19 LAB — BASIC METABOLIC PANEL
Anion gap: 8 (ref 5–15)
BUN: 17 mg/dL (ref 8–23)
CO2: 29 mmol/L (ref 22–32)
Calcium: 8.8 mg/dL — ABNORMAL LOW (ref 8.9–10.3)
Chloride: 95 mmol/L — ABNORMAL LOW (ref 98–111)
Creatinine, Ser: 1.17 mg/dL — ABNORMAL HIGH (ref 0.44–1.00)
GFR, Estimated: 44 mL/min — ABNORMAL LOW (ref >60)
Glucose, Bld: 103 mg/dL — ABNORMAL HIGH (ref 70–99)
Potassium: 3.6 mmol/L (ref 3.5–5.1)
Sodium: 132 mmol/L — ABNORMAL LOW (ref 135–145)

## 2021-08-19 LAB — CBC
HCT: 30.9 % — ABNORMAL LOW (ref 36.0–46.0)
Hemoglobin: 10.2 g/dL — ABNORMAL LOW (ref 12.0–15.0)
MCH: 30.4 pg (ref 26.0–34.0)
MCHC: 33 g/dL (ref 30.0–36.0)
MCV: 92 fL (ref 80.0–100.0)
Platelets: 282 10*3/uL (ref 150–400)
RBC: 3.36 MIL/uL — ABNORMAL LOW (ref 3.87–5.11)
RDW: 14.2 % (ref 11.5–15.5)
WBC: 10 10*3/uL (ref 4.0–10.5)
nRBC: 0 % (ref 0.0–0.2)

## 2021-08-19 MED ORDER — FUROSEMIDE 40 MG PO TABS: 40.0000 mg | ORAL_TABLET | Freq: Every day | ORAL | 11 refills | Status: DC

## 2021-08-19 MED ORDER — POTASSIUM CHLORIDE CRYS ER 20 MEQ PO TBCR: 20.0000 meq | EXTENDED_RELEASE_TABLET | Freq: Every day | ORAL | 3 refills | Status: DC

## 2021-08-19 NOTE — Progress Notes (Signed)
Mobility Specialist Progress Note:   08/19/21 1030  Mobility  Activity Ambulated in hall  Level of Assistance Standby assist, set-up cues, supervision of patient - no hands on  Assistive Device Front wheel walker  Distance Ambulated (ft) 230 ft  Mobility Ambulated with assistance in room  Mobility Response Tolerated well  Mobility performed by Mobility specialist  $Mobility charge 1 Mobility  SATURATION QUALIFICATIONS: (This note is used to comply with regulatory documentation for home oxygen)  Patient Saturations on Early at Rest = 96%  Patient Saturations on [place] while Ambulating = 90%  Patient Saturations on 0 Liters of oxygen while Ambulating = n/a%  Pt received in bed willing to participate in mobility. No complaints of pain. Pt stated she felt SOB but SpO2 remained above 90% throughout ambulation. Pt returned to bed with call bell in reach and all needs met.   [facility] Public librarian Phone [phone] Secondary Phone [phone]

## 2021-08-19 NOTE — Discharge Summary (Addendum)
Physician Discharge Summary  Adrienne [name] FMB:[id number] DOB: 03-29-1928 DOA: 08/17/2021  PCP: Kathyrn Lass, MD  Admit date: 08/17/2021 Discharge date: 08/19/2021  Time spent: 60 minutes  Recommendations for Outpatient Follow-up:  Follow-up PCP in 2 weeks Follow-up cardiology as outpatient  Discharge Diagnoses:  Principal Problem:   Volume overload Active Problems:   Pulmonary HTN (HCC)   Chronic atrial fibrillation (HCC)   Hyperlipidemia   Essential hypertension   Coronary artery disease due to lipid rich plaque   GERD (gastroesophageal reflux disease)   Cerebrovascular accident (CVA) due to embolism of left middle cerebral artery (HCC)   Chronic respiratory failure (HCC)   Acute diastolic CHF (congestive heart failure) ([place])   Discharge Condition: Stable  Diet recommendation: Heart healthy diet  Filed Weights   08/17/21 1800 08/19/21 0407  Weight: 65.3 kg 60.7 kg    History of present illness:  87 year old female with medical history of gout, anemia, A. fib, CAD, hyperlipidemia, CKD stage III, history of breast cancer, GERD, indwelling Foley catheter, CVA, GI bleed, mitral valve surgery presented with worsening shortness of breath.  Patient takes Lasix 20 mg daily at home.  In the ED chest x-ray showed interstitial edema, BNP elevated at 446.  Patient was given Lasix 40 mg IV in the ED with good diuretic response.  Hospital Course:   Acute on chronic diastolic CHF -Last echocardiogram showed EF 55 to 60% with indeterminate diastolic function with moderate mitral regurgitation -Repeat echocardiogram showed EF of 55 to 60% -Diuresed well with IV Lasix ; she was started on Lasix 40 mg IV every 12 hours  -Net -7.2 L -She has significantly improved, no longer requiring oxygen; O2 sats on ambulation on RA 90% -We will discharge home on Lasix 40 mg p.o. daily -Also prescribed K-Dur 20 meq p.o. daily to be taken along with Lasix   Atrial fibrillation -Heart rate  controlled -EKG shows atrial fibrillation -Continue anticoagulation with apixaban -Continue Cardizem 60 mg p.o. 3 times daily   Hyperlipidemia -Continue atorvastatin   History of CVA -Continue atorvastatin, Eliquis   Hypertension -Blood pressure is mildly elevated - continue metoprolol, Cardizem   COPD -Continue albuterol as needed  Procedures: Echocardiogram  Consultations:   Discharge Exam: Vitals:   08/19/21 0407 08/19/21 0830  BP: (!) 155/60 (!) 147/66  Pulse: 74 74  Resp: 18 18  Temp: 98.9 F (37.2 C) 98.3 F (36.8 C)  SpO2: 91% 95%    General: Appears in no acute distress Cardiovascular: S1-S2, regular, no murmur auscultated Respiratory: Clear to auscultation bilaterally  Discharge Instructions   Discharge Instructions     Diet - low sodium heart healthy   Complete by: As directed    Heart Failure patients record your daily weight using the same scale at the same time of day   Complete by: As directed    Increase activity slowly   Complete by: As directed       Allergies as of 08/19/2021       Reactions   Valium [diazepam] Other (See Comments)   HYPERACTIVITY   Amiodarone Hcl [amiodarone] Other (See Comments)   AFIB   Compazine [prochlorperazine] Other (See Comments)   HYPERACTIVITY    Other Other (See Comments)   "Kidney dye"--patient states this gave her severe shakes/chills   Thorazine [chlorpromazine] Other (See Comments)   HYPERACTIVITY    Zometa [zoledronic Acid] Other (See Comments)   PROBLEMS WITH EYES TIA   Ciprofloxacin Swelling   "swelling" in Rt.elbow   Doxycycline  Nausea Only        Medication List     TAKE these medications    acetaminophen 500 MG tablet Commonly known as: TYLENOL Take 500-1,000 mg by mouth every 12 (twelve) hours as needed (pain).   albuterol 108 (90 Base) MCG/ACT inhaler Commonly known as: Ventolin HFA Inhale 1 puff into the lungs every 6 (six) hours as needed for wheezing or shortness of  breath (Cough).   amoxicillin 500 MG capsule Commonly known as: AMOXIL Take 2,000 mg by mouth once. Prior to Dental Appointments   atorvastatin 10 MG tablet Commonly known as: LIPITOR TAKE 1 TABLET DAILY   diltiazem 60 MG tablet Commonly known as: CARDIZEM TAKE 1 TABLET THREE TIMES A DAY   Eliquis 5 MG Tabs tablet Generic drug: apixaban TAKE 1 TABLET TWICE A DAY   furosemide 40 MG tablet Commonly known as: Lasix Take 1 tablet (40 mg total) by mouth daily. What changed:  medication strength how much to take   metoprolol tartrate 100 MG tablet Commonly known as: LOPRESSOR TAKE 1 TABLET TWICE A DAY   multivitamin with minerals Tabs tablet Take 1 tablet by mouth daily. Centrum for Women 50+   omeprazole 10 MG capsule Commonly known as: PRILOSEC Take 10 mg by mouth every other day.   potassium chloride SA 20 MEQ tablet Commonly known as: KLOR-CON M Take 1 tablet (20 mEq total) by mouth daily.   SM CALCIUM CITRATE+D3 PETITE PO Take 1 tablet by mouth in the morning and at bedtime.   UP4 PROBIOTICS WOMENS PO Take 1 capsule by mouth daily.   vitamin C with rose hips 500 MG tablet Take 500 mg by mouth in the morning and at bedtime.       Allergies  Allergen Reactions   Valium [Diazepam] Other (See Comments)    HYPERACTIVITY    Amiodarone Hcl [Amiodarone] Other (See Comments)    AFIB   Compazine [Prochlorperazine] Other (See Comments)    HYPERACTIVITY    Other Other (See Comments)    "Kidney dye"--patient states this gave her severe shakes/chills   Thorazine [Chlorpromazine] Other (See Comments)    HYPERACTIVITY    Zometa [Zoledronic Acid] Other (See Comments)    PROBLEMS WITH EYES  TIA   Ciprofloxacin Swelling    "swelling" in Rt.elbow   Doxycycline Nausea Only      The results of significant diagnostics from this hospitalization (including imaging, microbiology, ancillary and laboratory) are listed below for reference.    Significant Diagnostic  Studies: DG Chest 2 View  Result Date: 08/17/2021 CLINICAL DATA:  Shortness of breath EXAM: CHEST - 2 VIEW COMPARISON:  Previous studies including the examination of 09/08/2020 FINDINGS: Transverse diameter of heart is increased. Increase in AP diameter of chest suggests COPD. There is prominence of interstitial markings in the parahilar regions and lower lung fields. There is no focal pulmonary consolidation. There is blunting of right lateral CP angle. There is no pneumothorax. IMPRESSION: Cardiomegaly. COPD. There is prominence of interstitial markings in the parahilar regions and lower lung fields suggesting interstitial pneumonia or interstitial edema. Part of this finding may suggest underlying scarring. There is no focal pulmonary consolidation. Small right pleural effusion Electronically Signed   By: Elmer Picker M.D.   On: 08/17/2021 16:10   ECHOCARDIOGRAM COMPLETE  Result Date: 08/18/2021    ECHOCARDIOGRAM REPORT   Patient Name:   SIMRANJIT THAYER Date of Exam: 08/18/2021 Medical Rec #:  [id number]  Height:       61.5 in Accession #:    [id number]               Weight:       144.0 lb Date of Birth:  October 29, 1927                BSA:          1.652 m Patient Age:    56 years                 BP:           147/63 mmHg Patient Gender: F                        HR:           77 bpm. Exam Location:  Inpatient Procedure: 2D Echo, Cardiac Doppler and Color Doppler Indications:    Dyspnea  History:        Patient has prior history of Echocardiogram examinations, most                 recent 11/30/2020. CAD, TIA, Stroke and Pulmonary HTN,                 Arrythmias:Atrial Fibrillation; Risk Factors:Hypertension and                 Dyslipidemia.  Sonographer:    Bernadene Person RDCS Referring Phys: [id number] [place]  1. Left ventricular ejection fraction, by estimation, is 55 to 60%. The left ventricle has normal function. The left ventricle has no regional wall motion  abnormalities. Left ventricular diastolic function could not be evaluated.  2. Right ventricular systolic function is normal. The right ventricular size is mildly enlarged. There is normal pulmonary artery systolic pressure.  3. Left atrial size was moderately dilated.  4. Right atrial size was moderately dilated.  5. The mitral valve is normal in structure. Moderate mitral valve regurgitation. No evidence of mitral stenosis.  6. The aortic valve is tricuspid. Aortic valve regurgitation is trivial. Aortic valve sclerosis is present, with no evidence of aortic valve stenosis.  7. The inferior vena cava is normal in size with greater than 50% respiratory variability, suggesting right atrial pressure of 3 mmHg. FINDINGS  Left Ventricle: Left ventricular ejection fraction, by estimation, is 55 to 60%. The left ventricle has normal function. The left ventricle has no regional wall motion abnormalities. The left ventricular internal cavity size was normal in size. There is  no left ventricular hypertrophy. Left ventricular diastolic function could not be evaluated due to atrial fibrillation. Left ventricular diastolic function could not be evaluated. Right Ventricle: The right ventricular size is mildly enlarged. Right ventricular systolic function is normal. There is normal pulmonary artery systolic pressure. The tricuspid regurgitant velocity is 2.87 m/s, and with an assumed right atrial pressure of 3 mmHg, the estimated right ventricular systolic pressure is 30.8 mmHg. Left Atrium: Left atrial size was moderately dilated. Right Atrium: Right atrial size was moderately dilated. Pericardium: There is no evidence of pericardial effusion. Mitral Valve: The mitral valve is normal in structure. Mild mitral annular calcification. Moderate mitral valve regurgitation. No evidence of mitral valve stenosis. Tricuspid Valve: The tricuspid valve is normal in structure. Tricuspid valve regurgitation is mild . No evidence of  tricuspid stenosis. Aortic Valve: The aortic valve is tricuspid. Aortic valve regurgitation is trivial. Aortic valve sclerosis is present, with no evidence of aortic valve  stenosis. Pulmonic Valve: The pulmonic valve was normal in structure. Pulmonic valve regurgitation is not visualized. No evidence of pulmonic stenosis. Aorta: The aortic root is normal in size and structure. Venous: The inferior vena cava is normal in size with greater than 50% respiratory variability, suggesting right atrial pressure of 3 mmHg. IAS/Shunts: No atrial level shunt detected by color flow Doppler.  LEFT VENTRICLE PLAX 2D LVIDd:         5.00 cm      Diastology LVIDs:         3.40 cm      LV e' medial:    6.53 cm/s LV PW:         0.90 cm      LV E/e' medial:  21.7 LV IVS:        0.90 cm      LV e' lateral:   8.70 cm/s LVOT diam:     2.00 cm      LV E/e' lateral: 16.3 LV SV:         94 LV SV Index:   57 LVOT Area:     3.14 cm  LV Volumes (MOD) LV vol d, MOD A2C: 102.0 ml LV vol d, MOD A4C: 102.0 ml LV vol s, MOD A2C: 41.7 ml LV vol s, MOD A4C: 48.6 ml LV SV MOD A2C:     60.3 ml LV SV MOD A4C:     102.0 ml LV SV MOD BP:      58.9 ml RIGHT VENTRICLE RV S prime:     7.35 cm/s TAPSE (M-mode): 1.8 cm LEFT ATRIUM             Index        RIGHT ATRIUM           Index LA diam:        4.90 cm 2.97 cm/m   RA Area:     16.00 cm LA Vol (A2C):   63.0 ml 38.13 ml/m  RA Volume:   36.10 ml  21.85 ml/m LA Vol (A4C):   62.4 ml 37.76 ml/m LA Biplane Vol: 64.8 ml 39.21 ml/m  AORTIC VALVE LVOT Vmax:   147.00 cm/s LVOT Vmean:  100.000 cm/s LVOT VTI:    0.298 m  AORTA Ao Root diam: 3.20 cm Ao Asc diam:  3.40 cm MITRAL VALVE                  TRICUSPID VALVE MV Area (PHT): 5.31 cm       TR Peak grad:   32.9 mmHg MV Decel Time: 143 msec       TR Vmax:        287.00 cm/s MR Peak grad:    101.2 mmHg MR Mean grad:    76.0 mmHg    SHUNTS MR Vmax:         503.00 cm/s  Systemic VTI:  0.30 m MR Vmean:        424.0 cm/s   Systemic Diam: 2.00 cm MR PISA:          0.57 cm MR PISA Eff ROA: 4 mm MR PISA Radius:  0.30 cm MV E velocity: 142.00 cm/s MV A velocity: 60.60 cm/s MV E/A ratio:  2.34 Kirk Ruths MD Electronically signed by Kirk Ruths MD Signature Date/Time: 08/18/2021/12:51:53 PM    Final    VAS [place] LOWER EXTREMITY VENOUS (DVT)  Result Date: 07/28/2021  Lower Venous DVT Study Patient Name:  LAKAYLA BARRINGTON [facility]  Date  of Exam:   07/28/2021 Medical Rec #: [id number]                 Accession #:    [id number] Date of Birth: [birth year]                 Patient Gender: F Patient Age:   7 years Exam Location:  [facility] Procedure:      VAS [place] LOWER EXTREMITY VENOUS (DVT) Referring Phys: Paralee Cancel --------------------------------------------------------------------------------  Indications: Pain, and Swelling. Other Indications: History of stroke. Anticoagulation: Eliquis.  Comparison Study: None Performing Technologist: Ivan Croft  Examination Guidelines: A complete evaluation includes B-mode imaging, spectral Doppler, color Doppler, and power Doppler as needed of all accessible portions of each vessel. Bilateral testing is considered an integral part of a complete examination. Limited examinations for reoccurring indications may be performed as noted. The reflux portion of the exam is performed with the patient in reverse Trendelenburg.  +---------+---------------+---------+-----------+----------+--------------+ RIGHT    CompressibilityPhasicitySpontaneityPropertiesThrombus Aging +---------+---------------+---------+-----------+----------+--------------+ CFV      Full           Yes      Yes                                 +---------+---------------+---------+-----------+----------+--------------+ SFJ      Full                                                        +---------+---------------+---------+-----------+----------+--------------+ FV Prox  Full                                                         +---------+---------------+---------+-----------+----------+--------------+ FV Mid   Full           Yes      Yes                                 +---------+---------------+---------+-----------+----------+--------------+ FV DistalFull                                                        +---------+---------------+---------+-----------+----------+--------------+ PFV      Full                                                        +---------+---------------+---------+-----------+----------+--------------+ POP      Full           Yes      Yes                                 +---------+---------------+---------+-----------+----------+--------------+ PTV      Full                                                        +---------+---------------+---------+-----------+----------+--------------+  PERO     Full                                                        +---------+---------------+---------+-----------+----------+--------------+ GSV      Full                                                        +---------+---------------+---------+-----------+----------+--------------+ SSV      Full                                                        +---------+---------------+---------+-----------+----------+--------------+   +----+---------------+---------+-----------+--------------------+--------------+ LEFTCompressibilityPhasicitySpontaneityProperties          Thrombus Aging +----+---------------+---------+-----------+--------------------+--------------+ CFV Full           No       Yes        Slightly pulsatile                                                        venous flow                        +----+---------------+---------+-----------+--------------------+--------------+     Summary: RIGHT: - There is no evidence of deep vein thrombosis in the lower extremity. - There is no evidence of superficial venous thrombosis.  - No cystic  structure found in the popliteal fossa.  LEFT: - No evidence of common femoral vein obstruction. - Results given to [facility] at Dr. Aurea Graff (07/28/2021).  *See table(s) above for measurements and observations. Electronically signed by Servando Snare MD on 07/28/2021 at 4:37:53 PM.    Final     Microbiology: Recent Results (from the past 240 hour(s))  Resp Panel by RT-PCR (Flu A&B, Covid) Nasopharyngeal Swab     Status: None   Collection Time: 08/17/21  2:55 PM   Specimen: Nasopharyngeal Swab; Nasopharyngeal(NP) swabs in vial transport medium  Result Value Ref Range Status   SARS Coronavirus 2 by RT PCR NEGATIVE NEGATIVE Final    Comment: (NOTE) SARS-CoV-2 target nucleic acids are NOT DETECTED.  The SARS-CoV-2 RNA is generally detectable in upper respiratory specimens during the acute phase of infection. The lowest concentration of SARS-CoV-2 viral copies this assay can detect is 138 copies/mL. A negative result does not preclude SARS-Cov-2 infection and should not be used as the sole basis for treatment or other patient management decisions. A negative result may occur with  improper specimen collection/handling, submission of specimen other than nasopharyngeal swab, presence of viral mutation(s) within the areas targeted by this assay, and inadequate number of viral copies(<138 copies/mL). A negative result must be combined with clinical observations, patient history, and epidemiological information. The expected result is Negative.  Fact Sheet for Patients:  EntrepreneurPulse.com.au  Fact Sheet for Healthcare Providers:  IncredibleEmployment.be  This test is no  t yet approved or cleared by the [place] and  has been authorized for detection and/or diagnosis of SARS-CoV-2 by FDA under an Emergency Use Authorization (EUA). This EUA will remain  in effect (meaning this test can be used) for the duration of the COVID-19 declaration under  Section 564(b)(1) of the Act, 21 U.S.C.section 360bbb-3(b)(1), unless the authorization is terminated  or revoked sooner.       Influenza A by PCR NEGATIVE NEGATIVE Final   Influenza B by PCR NEGATIVE NEGATIVE Final    Comment: (NOTE) The Xpert Xpress SARS-CoV-2/FLU/RSV plus assay is intended as an aid in the diagnosis of influenza from Nasopharyngeal swab specimens and should not be used as a sole basis for treatment. Nasal washings and aspirates are unacceptable for Xpert Xpress SARS-CoV-2/FLU/RSV testing.  Fact Sheet for Patients: EntrepreneurPulse.com.au  Fact Sheet for Healthcare Providers: IncredibleEmployment.be  This test is not yet approved or cleared by the [place] FDA and has been authorized for detection and/or diagnosis of SARS-CoV-2 by FDA under an Emergency Use Authorization (EUA). This EUA will remain in effect (meaning this test can be used) for the duration of the COVID-19 declaration under Section 564(b)(1) of the Act, 21 U.S.C. section 360bbb-3(b)(1), unless the authorization is terminated or revoked.  Performed at [facility], [place] [address] [place], [place] [address]      Labs: Basic Metabolic Panel: Recent Labs  Lab 08/17/21 1503 08/18/21 0500 08/19/21 0308  NA 136 137 132*  K 4.4 3.6 3.6  CL 100 97* 95*  CO2 27 31 29   GLUCOSE 107* 92 103*  BUN 13 12 17   CREATININE 1.01* 0.94 1.17*  CALCIUM 9.2 8.8* 8.8*  MG  --  2.0  --    Liver Function Tests: No results for input(s): AST, ALT, ALKPHOS, BILITOT, PROT, ALBUMIN in the last 168 hours. No results for input(s): LIPASE, AMYLASE in the last 168 hours. No results for input(s): AMMONIA in the last 168 hours. CBC: Recent Labs  Lab 08/17/21 1503 08/18/21 0500 08/19/21 0308  WBC 10.2 8.5 10.0  NEUTROABS 7.9*  --   --   HGB 10.5* 9.9* 10.2*  HCT 33.1* 30.4* 30.9*  MCV 94.8 93.8 92.0  PLT 306 286 282   Cardiac Enzymes: No results for  input(s): CKTOTAL, CKMB, CKMBINDEX, TROPONINI in the last 168 hours. BNP: BNP (last 3 results) Recent Labs    09/09/20 1136 08/17/21 1503  BNP 456.1* 546.5*    ProBNP (last 3 results) Recent Labs    11/04/20 1647  PROBNP 2,588*    CBG: No results for input(s): GLUCAP in the last 168 hours.     Signed:  Oswald Hillock MD.  [facility] 08/19/2021, 9:25 AM

## 2021-08-19 NOTE — Progress Notes (Signed)
Heart Failure Navigator Progress Note  Assessed for Heart & Vascular TOC clinic readiness.  Pt declined HV TOC clinic appt upon DC from hospitalization. Has scheduled repeat ECHO in 3 mo.    Navigator available for reassessment of patient.   Pricilla Holm, MSN, RN Heart Failure Nurse Navigator [phone]

## 2021-08-23 DIAGNOSIS — Z23 Encounter for immunization: Secondary | ICD-10-CM | POA: Diagnosis not present

## 2021-08-23 DIAGNOSIS — D649 Anemia, unspecified: Secondary | ICD-10-CM | POA: Diagnosis not present

## 2021-08-23 DIAGNOSIS — I4819 Other persistent atrial fibrillation: Secondary | ICD-10-CM | POA: Diagnosis not present

## 2021-08-23 DIAGNOSIS — D6869 Other thrombophilia: Secondary | ICD-10-CM | POA: Diagnosis not present

## 2021-08-23 DIAGNOSIS — N183 Chronic kidney disease, stage 3 unspecified: Secondary | ICD-10-CM | POA: Diagnosis not present

## 2021-08-23 DIAGNOSIS — Z96651 Presence of right artificial knee joint: Secondary | ICD-10-CM | POA: Diagnosis not present

## 2021-08-23 DIAGNOSIS — I340 Nonrheumatic mitral (valve) insufficiency: Secondary | ICD-10-CM | POA: Diagnosis not present

## 2021-08-23 DIAGNOSIS — S8001XD Contusion of right knee, subsequent encounter: Secondary | ICD-10-CM | POA: Diagnosis not present

## 2021-08-23 DIAGNOSIS — S8991XS Unspecified injury of right lower leg, sequela: Secondary | ICD-10-CM | POA: Diagnosis not present

## 2021-08-24 NOTE — Progress Notes (Signed)
Cardiology Office Note:    Date:  08/25/2021   ID:  [place], DOB Jul 26, 1928, MRN [id number]  PCP:  Kathyrn Lass, MD   [facility] Providers Cardiologist:  Candee Furbish, MD { Referring MD: Kathyrn Lass, MD   Chief Complaint:  Hospital f/u volume overload    Patient Profile:   Adrienne [name] is a 87 y.o. female with:  Pulmonary hypertension, permanent atrial fibrillaiton, HTN, nonobstructive CAD by cath 2013, CVA  mitral regurgitation, GIB, .  She had left MCA stroke when she was off anticoagulation secondary to GI bleed.  She additionally had vaginal bleeding on anticoagulation.  She was last seen in our office by Dr. Marlou Porch on 02/17/21.  Echocardiogram was ordered, and she was advised to follow-up in 12 months  History of Present Illness: [name]  is here today with her husband. She is here for hospital f/u from admission on 08/17/21 for acute CHF.  She reports that she had shortness of breath that was worsening over the previous 4 days, and increased leg edema.  Her shortness of breath was present at rest but significantly increased with exertion.  At the time of admission she was taking Lasix 20 mg a day and no formal diagnosis of CHF.  Echocardiogram on 08/18/2021 showed LVEF 55 to 60%, moderate mitral regurgitation. Left ventricular diastolic function could not be evaluated.  Her blood pressure was elevated at the time of admission.  She was given IV Lasix 40 mg every 12 hours and had a net loss of 7 L.  She was discharged on 40 mg of Lasix daily.  During her hospitalization her heart rate was well controlled on Cardizem and blood pressure remained mildly elevated.   She reports she has felt well since discharge.  States her legs are the least swollen she can remember them.  She continues to have good urine output on Lasix 40 mg daily.  She has questions about her diagnosis of heart failure.   ASSESSMENT & PLAN:   No problem-specific Assessment & Plan  notes found for this encounter.    Acute on chronic HFpEF: LVEF by echo 08/18/21 55 to 60%.  Diastolic function could not be measured, however it is presumed in the setting of fluid overload and hypertension with preserved ejection fraction. She responded well to diuresis during the hospitalization.  Appears euvolemic today.  She is weighing daily and denies weight gain.  Has reduced sodium intake and is aware to notify us with weight gain of greater than 2 pounds in 24 hours or greater than 5 pounds in 1 week.  She is on diltiazem in the setting of chronic atrial fibrillation and hypertension. Could consider GDMT with ARNI, ARB, ACE-I in the future if rate is well-controlled and BP will allow. Continue Lasix, metoprolol.    Chronic atrial fibrillation on chronic anticoagulation: She denies concerns for bleeding on chronic anticoagulation.  Her Eliquis dose is appropriate for her creatine < 1.5, weight greater than 60 kg today, however we will recheck bmet today. If creatinine is elevated greater than 1.5 will lower her dose to 2.5 mg twice daily.  Continue beta-blocker, diltiazem, Eliquis.  Essential hypertension: Blood pressure is stable and she is monitoring at home. As noted above, could consider additional GDMT agents for HFpEF in the future.  Continue diltiazem, Lasix, metoprolol.  Dyspnea/Hx of Pulmonary hypertension: Normal pulmonary artery systolic pressure by echo 12/22. She reports mild SOB with activity and continues to use albuterol for wheezing. SaO2 initially  91%  with improvement to 100% after sitting.   Mitral Regurgitation: Moderate by echocardiogram 08/18/2021.  She denies palpitations, chest pain, dizziness, presyncope, syncope.  Will monitor on repeat echo in 6 to 12 months or sooner if symptoms develop.  History of stroke: LDL 54 10/21. Continue atorvastatin. Continue Eliquis.     Dispo:  5 months with Dr. Marlou Porch   Prior CV studies: Echocardiogram 08/18/2021 EF 55-60, no RWMA,  normal RVSF, moderate BAE, moderate MR, trivial AI, AV sclerosis without stenosis  Carotid US 12/04/2017 Bilateral ICA 1-39   No results found for this or any previous visit from the past 3650 days.   ECHO COMPLETE WO IMAGING ENHANCING AGENT 08/18/2021  Narrative ECHOCARDIOGRAM REPORT    Patient Name:   Adrienne [name] Date of Exam: 08/18/2021 Medical Rec #:  [id number]                Height:       61.5 in Accession #:    [id number]               Weight:       144.0 lb Date of Birth:  01-Nov-1927                BSA:          1.652 m Patient Age:    40 years                 BP:           147/63 mmHg Patient Gender: F                        HR:           77 bpm. Exam Location:  Inpatient  Procedure: 2D Echo, Cardiac Doppler and Color Doppler  Indications:    Dyspnea  History:        Patient has prior history of Echocardiogram examinations, most recent 11/30/2020. CAD, TIA, Stroke and Pulmonary HTN, Arrythmias:Atrial Fibrillation; Risk Factors:Hypertension and Dyslipidemia.  Sonographer:    Bernadene Person RDCS Referring Phys: [id number] [place]   1. Left ventricular ejection fraction, by estimation, is 55 to 60%. The left ventricle has normal function. The left ventricle has no regional wall motion abnormalities. Left ventricular diastolic function could not be evaluated. 2. Right ventricular systolic function is normal. The right ventricular size is mildly enlarged. There is normal pulmonary artery systolic pressure. 3. Left atrial size was moderately dilated. 4. Right atrial size was moderately dilated. 5. The mitral valve is normal in structure. Moderate mitral valve regurgitation. No evidence of mitral stenosis. 6. The aortic valve is tricuspid. Aortic valve regurgitation is trivial. Aortic valve sclerosis is present, with no evidence of aortic valve stenosis. 7. The inferior vena cava is normal in size with greater than 50% respiratory  variability, suggesting right atrial pressure of 3 mmHg.  FINDINGS Left Ventricle: Left ventricular ejection fraction, by estimation, is 55 to 60%. The left ventricle has normal function. The left ventricle has no regional wall motion abnormalities. The left ventricular internal cavity size was normal in size. There is no left ventricular hypertrophy. Left ventricular diastolic function could not be evaluated due to atrial fibrillation. Left ventricular diastolic function could not be evaluated. Right Ventricle: The right ventricular size is mildly enlarged. Right ventricular systolic function is normal. There is normal pulmonary artery systolic pressure. The tricuspid regurgitant velocity is 2.87 m/s, and with an assumed  right atrial pressure of 3 mmHg, the estimated right ventricular systolic pressure is 95.6 mmHg. Left Atrium: Left atrial size was moderately dilated. Right Atrium: Right atrial size was moderately dilated. Pericardium: There is no evidence of pericardial effusion. Mitral Valve: The mitral valve is normal in structure. Mild mitral annular calcification. Moderate mitral valve regurgitation. No evidence of mitral valve stenosis. Tricuspid Valve: The tricuspid valve is normal in structure. Tricuspid valve regurgitation is mild . No evidence of tricuspid stenosis. Aortic Valve: The aortic valve is tricuspid. Aortic valve regurgitation is trivial. Aortic valve sclerosis is present, with no evidence of aortic valve stenosis. Pulmonic Valve: The pulmonic valve was normal in structure. Pulmonic valve regurgitation is not visualized. No evidence of pulmonic stenosis. Aorta: The aortic root is normal in size and structure. Venous: The inferior vena cava is normal in size with greater than 50% respiratory variability, suggesting right atrial pressure of 3 mmHg. IAS/Shunts: No atrial level shunt detected by color flow Doppler.   LEFT VENTRICLE PLAX 2D LVIDd:         5.00 cm       Diastology LVIDs:         3.40 cm      LV e' medial:    6.53 cm/s LV PW:         0.90 cm      LV E/e' medial:  21.7 LV IVS:        0.90 cm      LV e' lateral:   8.70 cm/s LVOT diam:     2.00 cm      LV E/e' lateral: 16.3 LV SV:         94 LV SV Index:   57 LVOT Area:     3.14 cm  LV Volumes (MOD) LV vol d, MOD A2C: 102.0 ml LV vol d, MOD A4C: 102.0 ml LV vol s, MOD A2C: 41.7 ml LV vol s, MOD A4C: 48.6 ml LV SV MOD A2C:     60.3 ml LV SV MOD A4C:     102.0 ml LV SV MOD BP:      58.9 ml  RIGHT VENTRICLE RV S prime:     7.35 cm/s TAPSE (M-mode): 1.8 cm  LEFT ATRIUM             Index        RIGHT ATRIUM           Index LA diam:        4.90 cm 2.97 cm/m   RA Area:     16.00 cm LA Vol (A2C):   63.0 ml 38.13 ml/m  RA Volume:   36.10 ml  21.85 ml/m LA Vol (A4C):   62.4 ml 37.76 ml/m LA Biplane Vol: 64.8 ml 39.21 ml/m AORTIC VALVE LVOT Vmax:   147.00 cm/s LVOT Vmean:  100.000 cm/s LVOT VTI:    0.298 m  AORTA Ao Root diam: 3.20 cm Ao Asc diam:  3.40 cm  MITRAL VALVE                  TRICUSPID VALVE MV Area (PHT): 5.31 cm       TR Peak grad:   32.9 mmHg MV Decel Time: 143 msec       TR Vmax:        287.00 cm/s MR Peak grad:    101.2 mmHg MR Mean grad:    76.0 mmHg    SHUNTS MR Vmax:  503.00 cm/s  Systemic VTI:  0.30 m MR Vmean:        424.0 cm/s   Systemic Diam: 2.00 cm MR PISA:         0.57 cm MR PISA Eff ROA: 4 mm MR PISA Radius:  0.30 cm MV E velocity: 142.00 cm/s MV A velocity: 60.60 cm/s MV E/A ratio:  2.34  Kirk Ruths MD Electronically signed by Kirk Ruths MD Signature Date/Time: 08/18/2021/12:51:53 PM   Past Medical History:  Diagnosis Date   Afib ([place])    Arthritis of ankle    CAD (coronary artery disease)    Cancer ([place]) 2012   breast cancer-right   Compression fracture of T12 vertebra (HCC)    Diverticulosis    Edema    Elevated uric acid in blood    GERD (gastroesophageal reflux disease)    GI bleed 12/2015   Hyperlipidemia     Hypertension    Long-term (current) use of anticoagulants    Moderate mitral regurgitation    Personal history of radiation therapy    Pulmonary HTN ([place])    Echo 1/19: EF 60-65, no RWMA, Ao sclerosis, trivial AI, mild MR, mod BAE, mod TR, PASP 49   Stroke ([place]) 01/25/2016   TIA (transient ischemic attack) 09/2011   Urinary incontinence    Urinary retention    Current Medications: Current Meds  Medication Sig   acetaminophen (TYLENOL) 500 MG tablet Take 500-1,000 mg by mouth every 12 (twelve) hours as needed (pain).    albuterol (VENTOLIN HFA) 108 (90 Base) MCG/ACT inhaler Inhale 1 puff into the lungs every 6 (six) hours as needed for wheezing or shortness of breath (Cough).   amoxicillin (AMOXIL) 500 MG capsule Take 2,000 mg by mouth once. Prior to Dental Appointments   Ascorbic Acid (VITAMIN C WITH ROSE HIPS) 500 MG tablet Take 500 mg by mouth in the morning and at bedtime.   atorvastatin (LIPITOR) 10 MG tablet TAKE 1 TABLET DAILY   Calcium Citrate-Vitamin D (SM CALCIUM CITRATE+D3 PETITE PO) Take 1 tablet by mouth in the morning and at bedtime.   diltiazem (CARDIZEM) 60 MG tablet TAKE 1 TABLET THREE TIMES A DAY   ELIQUIS 5 MG TABS tablet TAKE 1 TABLET TWICE A DAY   furosemide (LASIX) 40 MG tablet Take 1 tablet (40 mg total) by mouth daily.   metoprolol tartrate (LOPRESSOR) 100 MG tablet TAKE 1 TABLET TWICE A DAY   Multiple Vitamin (MULTIVITAMIN WITH MINERALS) TABS tablet Take 1 tablet by mouth daily. Centrum for Women 50+   omeprazole (PRILOSEC) 10 MG capsule Take 10 mg by mouth every other day.   potassium chloride SA (KLOR-CON M) 20 MEQ tablet Take 1 tablet (20 mEq total) by mouth daily.   Probiotic Product (UP4 PROBIOTICS WOMENS PO) Take 1 capsule by mouth daily.    Allergies:   Valium [diazepam], Amiodarone hcl [amiodarone], Compazine [prochlorperazine], Other, Thorazine [chlorpromazine], Zometa [zoledronic acid], Ciprofloxacin, and Doxycycline   Social History   Tobacco Use    Smoking status: Former    Packs/day: 0.25    Years: 2.00    Pack years: 0.50    Types: Cigarettes    Quit date: 1950    Years since quitting: 73.0   Smokeless tobacco: Never  Vaping Use   Vaping Use: Never used  Substance Use Topics   Alcohol use: No    Alcohol/week: 0.0 standard drinks   Drug use: No    Family Hx: The patient's family history includes Asthma in  her mother; Atrial fibrillation in her sister; CVA in her father; Cancer in her daughter and maternal grandmother; Pneumonia in her brother.  Review of Systems  Respiratory:  Positive for shortness of breath and wheezing.     EKGs/Labs/Other Test Reviewed:    EKG:  EKG is not ordered today.   Recent Labs: 11/04/2020: NT-Pro BNP 2,588 12/21/2020: ALT 10 08/17/2021: B Natriuretic Peptide 546.5 08/18/2021: Magnesium 2.0 08/19/2021: BUN 17; Creatinine, Ser 1.17; Hemoglobin 10.2; Platelets 282; Potassium 3.6; Sodium 132   Recent Lipid Panel Lab Results  Component Value Date/Time   CHOL 128 05/06/2019 04:08 PM   TRIG 101 05/06/2019 04:08 PM   HDL 50 05/06/2019 04:08 PM   LDLCALC 58 05/06/2019 04:08 PM     Risk Assessment/Calculations:    CHA2DS2-VASc Score = 7   This indicates a 11.2% annual risk of stroke. The patient's score is based upon: CHF History: 1 HTN History: 1 Diabetes History: 0 Stroke History: 2 Vascular Disease History: 0 Age Score: 2 Gender Score: 1      Physical Exam:    VS:  BP 132/60    Pulse 60    Ht 5\' 1"  (1.549 m)    Wt 137 lb 3.2 oz (62.2 kg)    LMP 09/12/1973 (Approximate)    SpO2 100%    BMI 25.92 kg/m     Wt Readings from Last 3 Encounters:  08/25/21 137 lb 3.2 oz (62.2 kg)  08/19/21 133 lb 12.8 oz (60.7 kg)  02/17/21 144 lb (65.3 kg)    Constitutional:      Appearance: Healthy appearance. Not in distress.  Pulmonary:     Breath sounds: Normal breath sounds and air entry.  Cardiovascular:     Normal rate. Irregular rhythm.     Murmurs: There is no murmur.     No gallop.   No click.  Pulses:    Dorsalis pedis: 2+ bilaterally. Edema:    Ankle: bilateral trace edema of the ankle.    Feet: bilateral trace edema of the feet. Musculoskeletal: Normal range of motion. Skin:    General: Skin is warm and dry.  Neurological:     Mental Status: Oriented to person, place and time.       Medication Adjustments/Labs and Tests Ordered: Current medicines are reviewed at length with the patient today.  Concerns regarding medicines are outlined above.  Tests Ordered: Orders Placed This Encounter  Procedures   Basic Metabolic Panel (BMET)    Medication Changes: No orders of the defined types were placed in this encounter.  Signed, Emmaline Life, NP  08/25/2021 5:31 PM    [facility] [address], [place], [place]  [address] Phone: [phone]; Fax: [phone]

## 2021-08-25 ENCOUNTER — Encounter: Payer: Self-pay | Admitting: Nurse Practitioner

## 2021-08-25 ENCOUNTER — Other Ambulatory Visit: Payer: Self-pay

## 2021-08-25 ENCOUNTER — Ambulatory Visit (INDEPENDENT_AMBULATORY_CARE_PROVIDER_SITE_OTHER): Payer: Medicare Other | Admitting: Nurse Practitioner

## 2021-08-25 VITALS — BP 132/60 | HR 60 | Ht 61.0 in | Wt 137.2 lb

## 2021-08-25 DIAGNOSIS — I340 Nonrheumatic mitral (valve) insufficiency: Secondary | ICD-10-CM

## 2021-08-25 DIAGNOSIS — I482 Chronic atrial fibrillation, unspecified: Secondary | ICD-10-CM | POA: Diagnosis not present

## 2021-08-25 DIAGNOSIS — Z7901 Long term (current) use of anticoagulants: Secondary | ICD-10-CM | POA: Diagnosis not present

## 2021-08-25 DIAGNOSIS — I2583 Coronary atherosclerosis due to lipid rich plaque: Secondary | ICD-10-CM

## 2021-08-25 DIAGNOSIS — I272 Pulmonary hypertension, unspecified: Secondary | ICD-10-CM | POA: Diagnosis not present

## 2021-08-25 DIAGNOSIS — Z8673 Personal history of transient ischemic attack (TIA), and cerebral infarction without residual deficits: Secondary | ICD-10-CM

## 2021-08-25 DIAGNOSIS — I2510 Atherosclerotic heart disease of native coronary artery without angina pectoris: Secondary | ICD-10-CM | POA: Diagnosis not present

## 2021-08-25 DIAGNOSIS — I5033 Acute on chronic diastolic (congestive) heart failure: Secondary | ICD-10-CM | POA: Diagnosis not present

## 2021-08-25 LAB — BASIC METABOLIC PANEL
BUN/Creatinine Ratio: 17 (ref 12–28)
BUN: 23 mg/dL (ref 10–36)
CO2: 27 mmol/L (ref 20–29)
Calcium: 9.6 mg/dL (ref 8.7–10.3)
Chloride: 95 mmol/L — ABNORMAL LOW (ref 96–106)
Creatinine, Ser: 1.36 mg/dL — ABNORMAL HIGH (ref 0.57–1.00)
Glucose: 97 mg/dL (ref 70–99)
Potassium: 4.6 mmol/L (ref 3.5–5.2)
Sodium: 137 mmol/L (ref 134–144)
eGFR: 37 mL/min/{1.73_m2} — ABNORMAL LOW (ref >59)

## 2021-08-25 NOTE — Patient Instructions (Signed)
Medication Instructions:   Your physician recommends that you continue on your current medications as directed. Please refer to the Current Medication list given to you today.  *If you need a refill on your cardiac medications before your next appointment, please call your pharmacy*   Lab Work:  TODAY!!!!! BMET  If you have labs (blood work) drawn today and your tests are completely normal, you will receive your results only by: [place] (if you have MyChart) OR A paper copy in the mail If you have any lab test that is abnormal or we need to change your treatment, we will call you to review the results.   Testing/Procedures:  Your physician has requested that you have an echocardiogram. Echocardiography is a painless test that uses sound waves to create images of your heart. It provides your doctor with information about the size and shape of your heart and how well your hearts chambers and valves are working. This procedure takes approximately one hour. There are no restrictions for this procedure.    Follow-Up: At [facility], you and your health needs are our priority.  As part of our continuing mission to provide you with exceptional heart care, we have created designated Provider Care Teams.  These Care Teams include your primary Cardiologist (physician) and Advanced Practice Providers (APPs -  Physician Assistants and Nurse Practitioners) who all work together to provide you with the care you need, when you need it.  We recommend signing up for the patient portal called "MyChart".  Sign up information is provided on this After Visit Summary.  MyChart is used to connect with patients for Virtual Visits (Telemedicine).  Patients are able to view lab/test results, encounter notes, upcoming appointments, etc.  Non-urgent messages can be sent to your provider as well.   To learn more about what you can do with MyChart, go to [email or web address].    Your next appointment:    4 month(s)  The format for your next appointment:   In Person  Provider:   Candee Furbish, MD     Other Instructions

## 2021-08-26 ENCOUNTER — Telehealth: Payer: Self-pay

## 2021-08-26 DIAGNOSIS — I482 Chronic atrial fibrillation, unspecified: Secondary | ICD-10-CM

## 2021-08-26 DIAGNOSIS — Z7901 Long term (current) use of anticoagulants: Secondary | ICD-10-CM

## 2021-08-26 DIAGNOSIS — I5033 Acute on chronic diastolic (congestive) heart failure: Secondary | ICD-10-CM

## 2021-08-26 NOTE — Telephone Encounter (Signed)
-----   Message from Emmaline Life, NP sent at 08/25/2021  5:54 PM EST ----- Creatinine is elevated on diuretic therapy. Continue current furosemide dose of 40 mg daily and continue Eliquis at 5 mg twice daily. Can we please repeat the bmet in 7-10 days.

## 2021-08-26 NOTE — Telephone Encounter (Signed)
The patient has been notified of the result and verbalized understanding.  All questions (if any) were answered. Antonieta Iba, RN 08/26/2021 8:20 AM  Repeat labs have been scheduled.

## 2021-08-30 DIAGNOSIS — Z23 Encounter for immunization: Secondary | ICD-10-CM | POA: Diagnosis not present

## 2021-09-02 ENCOUNTER — Other Ambulatory Visit: Payer: Self-pay | Admitting: Cardiology

## 2021-09-02 DIAGNOSIS — K219 Gastro-esophageal reflux disease without esophagitis: Secondary | ICD-10-CM | POA: Diagnosis not present

## 2021-09-02 DIAGNOSIS — D649 Anemia, unspecified: Secondary | ICD-10-CM | POA: Diagnosis not present

## 2021-09-02 DIAGNOSIS — I4891 Unspecified atrial fibrillation: Secondary | ICD-10-CM | POA: Diagnosis not present

## 2021-09-02 DIAGNOSIS — N183 Chronic kidney disease, stage 3 unspecified: Secondary | ICD-10-CM | POA: Diagnosis not present

## 2021-09-02 DIAGNOSIS — M810 Age-related osteoporosis without current pathological fracture: Secondary | ICD-10-CM | POA: Diagnosis not present

## 2021-09-02 DIAGNOSIS — D509 Iron deficiency anemia, unspecified: Secondary | ICD-10-CM | POA: Diagnosis not present

## 2021-09-02 DIAGNOSIS — Z853 Personal history of malignant neoplasm of breast: Secondary | ICD-10-CM | POA: Diagnosis not present

## 2021-09-02 DIAGNOSIS — N1831 Chronic kidney disease, stage 3a: Secondary | ICD-10-CM | POA: Diagnosis not present

## 2021-09-02 DIAGNOSIS — E7800 Pure hypercholesterolemia, unspecified: Secondary | ICD-10-CM | POA: Diagnosis not present

## 2021-09-02 DIAGNOSIS — I2510 Atherosclerotic heart disease of native coronary artery without angina pectoris: Secondary | ICD-10-CM | POA: Diagnosis not present

## 2021-09-02 DIAGNOSIS — I272 Pulmonary hypertension, unspecified: Secondary | ICD-10-CM | POA: Diagnosis not present

## 2021-09-03 ENCOUNTER — Other Ambulatory Visit: Payer: Self-pay

## 2021-09-03 ENCOUNTER — Other Ambulatory Visit: Payer: Medicare Other | Admitting: *Deleted

## 2021-09-03 DIAGNOSIS — I5033 Acute on chronic diastolic (congestive) heart failure: Secondary | ICD-10-CM

## 2021-09-03 DIAGNOSIS — I482 Chronic atrial fibrillation, unspecified: Secondary | ICD-10-CM | POA: Diagnosis not present

## 2021-09-03 DIAGNOSIS — Z7901 Long term (current) use of anticoagulants: Secondary | ICD-10-CM

## 2021-09-04 LAB — BASIC METABOLIC PANEL
BUN/Creatinine Ratio: 19 (ref 12–28)
BUN: 22 mg/dL (ref 10–36)
CO2: 26 mmol/L (ref 20–29)
Calcium: 9.9 mg/dL (ref 8.7–10.3)
Chloride: 99 mmol/L (ref 96–106)
Creatinine, Ser: 1.18 mg/dL — ABNORMAL HIGH (ref 0.57–1.00)
Glucose: 111 mg/dL — ABNORMAL HIGH (ref 70–99)
Potassium: 4.4 mmol/L (ref 3.5–5.2)
Sodium: 138 mmol/L (ref 134–144)
eGFR: 44 mL/min/{1.73_m2} — ABNORMAL LOW (ref >59)

## 2021-09-08 DIAGNOSIS — S8001XD Contusion of right knee, subsequent encounter: Secondary | ICD-10-CM | POA: Diagnosis not present

## 2021-09-10 DIAGNOSIS — S81001D Unspecified open wound, right knee, subsequent encounter: Secondary | ICD-10-CM | POA: Diagnosis not present

## 2021-09-10 DIAGNOSIS — S8991XS Unspecified injury of right lower leg, sequela: Secondary | ICD-10-CM | POA: Diagnosis not present

## 2021-09-10 DIAGNOSIS — S8001XD Contusion of right knee, subsequent encounter: Secondary | ICD-10-CM | POA: Diagnosis not present

## 2021-09-14 ENCOUNTER — Other Ambulatory Visit: Payer: Self-pay | Admitting: Cardiology

## 2021-09-14 DIAGNOSIS — R3914 Feeling of incomplete bladder emptying: Secondary | ICD-10-CM | POA: Diagnosis not present

## 2021-09-15 MED ORDER — FUROSEMIDE 40 MG PO TABS: 40.0000 mg | ORAL_TABLET | Freq: Every day | ORAL | 3 refills | Status: DC

## 2021-09-20 DIAGNOSIS — M25561 Pain in right knee: Secondary | ICD-10-CM | POA: Diagnosis not present

## 2021-09-20 DIAGNOSIS — Z96651 Presence of right artificial knee joint: Secondary | ICD-10-CM | POA: Diagnosis not present

## 2021-09-20 DIAGNOSIS — S8001XD Contusion of right knee, subsequent encounter: Secondary | ICD-10-CM | POA: Diagnosis not present

## 2021-09-23 DIAGNOSIS — S8001XD Contusion of right knee, subsequent encounter: Secondary | ICD-10-CM | POA: Diagnosis not present

## 2021-10-05 ENCOUNTER — Telehealth: Payer: Self-pay

## 2021-10-05 ENCOUNTER — Other Ambulatory Visit: Payer: Self-pay

## 2021-10-05 ENCOUNTER — Ambulatory Visit (INDEPENDENT_AMBULATORY_CARE_PROVIDER_SITE_OTHER): Payer: Medicare Other | Admitting: Plastic Surgery

## 2021-10-05 ENCOUNTER — Encounter: Payer: Self-pay | Admitting: Plastic Surgery

## 2021-10-05 VITALS — BP 159/77 | HR 62 | Ht 61.0 in | Wt 138.8 lb

## 2021-10-05 DIAGNOSIS — Z7901 Long term (current) use of anticoagulants: Secondary | ICD-10-CM | POA: Diagnosis not present

## 2021-10-05 DIAGNOSIS — S81009A Unspecified open wound, unspecified knee, initial encounter: Secondary | ICD-10-CM | POA: Insufficient documentation

## 2021-10-05 DIAGNOSIS — I482 Chronic atrial fibrillation, unspecified: Secondary | ICD-10-CM

## 2021-10-05 DIAGNOSIS — Z719 Counseling, unspecified: Secondary | ICD-10-CM

## 2021-10-05 DIAGNOSIS — I63312 Cerebral infarction due to thrombosis of left middle cerebral artery: Secondary | ICD-10-CM | POA: Diagnosis not present

## 2021-10-05 DIAGNOSIS — S81001A Unspecified open wound, right knee, initial encounter: Secondary | ICD-10-CM | POA: Diagnosis not present

## 2021-10-05 DIAGNOSIS — I69398 Other sequelae of cerebral infarction: Secondary | ICD-10-CM | POA: Diagnosis not present

## 2021-10-05 DIAGNOSIS — R269 Unspecified abnormalities of gait and mobility: Secondary | ICD-10-CM

## 2021-10-05 NOTE — Telephone Encounter (Signed)
Returned patients call. Spoke with Dr. Marla Roe and verified there is no need to prescribe additional  ABX. If Dr. Alvan Dame feels like she needs more, to call his office. Patient understood and agreed with plan.

## 2021-10-05 NOTE — Progress Notes (Addendum)
Patient ID: Adrienne [name], female    DOB: 29-Sep-1927, 88 y.o.   MRN: [id number]   Chief Complaint  Patient presents with   Skin Problem    The patient is a 88 year old female here with her husband for evaluation of her right knee.  She fell and sustained a hematoma on the right knee in November.  It has been very slow to heal.  She is got some hypergranulation tissue and the wound is approximately 2 x 2 cm with a lateral wound that is 1 x 1 cm.  Her whole leg is extremely swollen and she has not been wearing compression stockings.  She has multiple medical conditions as listed below including A. fib for which she takes anticoagulation and cancer history.   Review of Systems  Constitutional:  Positive for activity change. Negative for appetite change.  Eyes: Negative.   Respiratory: Negative.  Negative for chest tightness.   Cardiovascular:  Positive for leg swelling.  Endocrine: Negative.   Genitourinary: Negative.   Musculoskeletal:  Positive for joint swelling.  Skin:  Positive for color change and wound.   Past Medical History:  Diagnosis Date   Afib ([place])    Arthritis of ankle    CAD (coronary artery disease)    Cancer ([place]) 2012   breast cancer-right   Compression fracture of T12 vertebra (HCC)    Diverticulosis    Edema    Elevated uric acid in blood    GERD (gastroesophageal reflux disease)    GI bleed 12/2015   Hyperlipidemia    Hypertension    Long-term (current) use of anticoagulants    Moderate mitral regurgitation    Personal history of radiation therapy    Pulmonary HTN ([place])    Echo 1/19: EF 60-65, no RWMA, Ao sclerosis, trivial AI, mild MR, mod BAE, mod TR, PASP 49   Stroke ([place]) 01/25/2016   TIA (transient ischemic attack) 09/2011   Urinary incontinence    Urinary retention     Past Surgical History:  Procedure Laterality Date   APPENDECTOMY     AXILLARY SENTINEL NODE BIOPSY Left 01/22/2020   Procedure: Left Axillary Sentinel Node Biopsy;   Surgeon: Adrienne Bookbinder, MD;  Location: [facility];  Service: General;  Laterality: Left;   BREAST LUMPECTOMY WITH RADIOACTIVE SEED AND SENTINEL LYMPH NODE BIOPSY Left 01/22/2020   Procedure: LEFT BREAST LUMPECTOMY WITH RADIOACTIVE SEED;  Surgeon: Adrienne Bookbinder, MD;  Location: [facility];  Service: General;  Laterality: Left;   BREAST SURGERY  2012   Rt.mastectomy--[place], [place] CATHETERIZATION  2013   CATARACT EXTRACTION, BILATERAL     CERVICAL CONIZATION W/BX N/A 08/08/2020   Procedure: VAGINAL SIDEWALL BIOPSY;  Surgeon: Adrienne Salon, MD;  Location: [facility];  Service: Gynecology;  Laterality: N/A;   DILATION AND CURETTAGE OF UTERUS     in her early 30's for DUB   ESOPHAGOGASTRODUODENOSCOPY Left 01/08/2016   Procedure: ESOPHAGOGASTRODUODENOSCOPY (EGD);  Surgeon: Adrienne Silence, MD;  Location: [facility];  Service: Endoscopy;  Laterality: Left;   GALLBLADDER SURGERY     MASTECTOMY Right 2012   RADIOLOGY WITH ANESTHESIA N/A 01/25/2016   Procedure: RADIOLOGY WITH ANESTHESIA;  Surgeon: Adrienne Bras, MD;  Location: [facility];  Service: Radiology;  Laterality: N/A;   REPLACEMENT TOTAL KNEE BILATERAL     TONSILLECTOMY AND ADENOIDECTOMY     as a child      Current Outpatient Medications:    acetaminophen (TYLENOL) 500 MG tablet, Take 500-1,000 mg  by mouth every 12 (twelve) hours as needed (pain). , Disp: , Rfl:    albuterol (VENTOLIN HFA) 108 (90 Base) MCG/ACT inhaler, Inhale 1 puff into the lungs every 6 (six) hours as needed for wheezing or shortness of breath (Cough)., Disp: 18 g, Rfl: 2   amoxicillin (AMOXIL) 500 MG capsule, Take 2,000 mg by mouth once. Prior to Dental Appointments, Disp: , Rfl:    Ascorbic Acid (VITAMIN C WITH ROSE HIPS) 500 MG tablet, Take 500 mg by mouth in the morning and at bedtime., Disp: , Rfl:    atorvastatin (LIPITOR) 10 MG tablet, TAKE 1 TABLET DAILY, Disp: 90 tablet, Rfl: 3   Calcium Citrate-Vitamin D (SM CALCIUM CITRATE+D3 PETITE PO), Take 1 tablet by  mouth in the morning and at bedtime., Disp: , Rfl:    diltiazem (CARDIZEM) 60 MG tablet, TAKE 1 TABLET THREE TIMES A DAY, Disp: 270 tablet, Rfl: 3   ELIQUIS 5 MG TABS tablet, TAKE 1 TABLET TWICE A DAY, Disp: 180 tablet, Rfl: 3   furosemide (LASIX) 40 MG tablet, Take 1 tablet (40 mg total) by mouth daily., Disp: 90 tablet, Rfl: 3   metoprolol tartrate (LOPRESSOR) 100 MG tablet, TAKE 1 TABLET TWICE A DAY, Disp: 180 tablet, Rfl: 3   Multiple Vitamin (MULTIVITAMIN WITH MINERALS) TABS tablet, Take 1 tablet by mouth daily. Centrum for Women 50+, Disp: , Rfl:    omeprazole (PRILOSEC) 10 MG capsule, Take 10 mg by mouth every other day., Disp: , Rfl:    potassium chloride SA (KLOR-CON M) 20 MEQ tablet, Take 1 tablet (20 mEq total) by mouth daily., Disp: 30 tablet, Rfl: 3   Probiotic Product (UP4 PROBIOTICS WOMENS PO), Take 1 capsule by mouth daily., Disp: , Rfl:    Objective:   Vitals:   10/05/21 0855  BP: (!) 159/77  Pulse: 62  SpO2: 96%    Physical Exam Constitutional:      Appearance: Normal appearance.  HENT:     Head: Normocephalic.  Cardiovascular:     Rate and Rhythm: Normal rate.     Pulses: Normal pulses.  Pulmonary:     Effort: Pulmonary effort is normal.  Musculoskeletal:        General: Swelling and deformity present.  Skin:    General: Skin is warm.     Capillary Refill: Capillary refill takes less than 2 seconds.     Coloration: Skin is not jaundiced.     Findings: Bruising and lesion present.  Neurological:     Mental Status: She is alert and oriented to person, place, and time.  Psychiatric:        Mood and Affect: Mood normal.        Behavior: Behavior normal.        Thought Content: Thought content normal.    Assessment & Plan:  Cerebral infarction due to thrombosis of left middle cerebral artery (HCC) s/p mechanical thrombectomy  Chronic atrial fibrillation (HCC)  Gait disturbance, post-stroke  Chronic anticoagulation  Open wound of right knee, initial  encounter  I went ahead and put the patient in compression stockings.  She should wear those during the day since they have the silver nano particles.  At night she should use Xeroform and a Mepilex border dressing.  I would like to see her back in 2 weeks.  I also used silver nitrate on the 2 x 2 centimeter wound to see if I can get that hypergranulation tissue resolved.  [place], DO

## 2021-10-05 NOTE — Telephone Encounter (Signed)
Patient left voicemail requesting prescription for cephalexin, 500mg  4 times daily to be sent. She was prescribed this at the time of her initial fall by Dr. Alvan Dame and states she only has enough left for one day.

## 2021-10-12 DIAGNOSIS — R3914 Feeling of incomplete bladder emptying: Secondary | ICD-10-CM | POA: Diagnosis not present

## 2021-10-13 ENCOUNTER — Other Ambulatory Visit: Payer: Self-pay | Admitting: Hematology and Oncology

## 2021-10-13 ENCOUNTER — Telehealth: Payer: Self-pay

## 2021-10-13 DIAGNOSIS — Z171 Estrogen receptor negative status [ER-]: Secondary | ICD-10-CM

## 2021-10-13 DIAGNOSIS — C50811 Malignant neoplasm of overlapping sites of right female breast: Secondary | ICD-10-CM

## 2021-10-13 DIAGNOSIS — C50212 Malignant neoplasm of upper-inner quadrant of left female breast: Secondary | ICD-10-CM

## 2021-10-13 DIAGNOSIS — M8580 Other specified disorders of bone density and structure, unspecified site: Secondary | ICD-10-CM

## 2021-10-13 DIAGNOSIS — Z7189 Other specified counseling: Secondary | ICD-10-CM

## 2021-10-13 NOTE — Telephone Encounter (Signed)
Kayla called from [facility] to request a wound assessment and progress notes for patient showing whether or not wound has been debrided.  Please call.

## 2021-10-13 NOTE — Telephone Encounter (Signed)
Called and spoke with Tiffany with [employer] regarding the message below.  Informed Tiffany that I spoke with Dr. Holly Bodily she would like to put a hold on the order until the patient comes back to see [place] in a week.  Tiffany verbalized understanding and agreed.//AB/CMA

## 2021-10-15 DIAGNOSIS — E7800 Pure hypercholesterolemia, unspecified: Secondary | ICD-10-CM | POA: Diagnosis not present

## 2021-10-15 DIAGNOSIS — Z0000 Encounter for general adult medical examination without abnormal findings: Secondary | ICD-10-CM | POA: Diagnosis not present

## 2021-10-15 DIAGNOSIS — Z1389 Encounter for screening for other disorder: Secondary | ICD-10-CM | POA: Diagnosis not present

## 2021-10-15 DIAGNOSIS — N183 Chronic kidney disease, stage 3 unspecified: Secondary | ICD-10-CM | POA: Diagnosis not present

## 2021-10-22 ENCOUNTER — Other Ambulatory Visit: Payer: Self-pay

## 2021-10-22 ENCOUNTER — Encounter: Payer: Self-pay | Admitting: Plastic Surgery

## 2021-10-22 ENCOUNTER — Ambulatory Visit (INDEPENDENT_AMBULATORY_CARE_PROVIDER_SITE_OTHER): Payer: Medicare Other | Admitting: Plastic Surgery

## 2021-10-22 DIAGNOSIS — S81001A Unspecified open wound, right knee, initial encounter: Secondary | ICD-10-CM

## 2021-10-22 DIAGNOSIS — I63312 Cerebral infarction due to thrombosis of left middle cerebral artery: Secondary | ICD-10-CM | POA: Diagnosis not present

## 2021-10-22 NOTE — Progress Notes (Signed)
° °  Subjective:    Patient ID: Adrienne [name], female    DOB: 02/24/1928, 88 y.o.   MRN: [id number]  The patient is a very sweet 88 year old lady here with her husband for evaluation of her right knee wound.  She had knee surgery by Dr. Ihor Gully and sustained a hematoma and had some trouble with the healing.  Today it actually looks like it is completely healed.  She has been wearing her doctor due to compression socks.  There is 5 mm scab.  She has been using Vaseline and keeping it clean.    Review of Systems  Constitutional: Negative.   HENT: Negative.    Eyes: Negative.   Respiratory: Negative.    Cardiovascular: Negative.   Gastrointestinal: Negative.   Endocrine: Negative.   Genitourinary: Negative.   Musculoskeletal: Negative.   Hematological: Negative.   Psychiatric/Behavioral: Negative.        Objective:   Physical Exam Constitutional:      Appearance: Normal appearance.  Cardiovascular:     Rate and Rhythm: Normal rate.     Pulses: Normal pulses.  Pulmonary:     Effort: Pulmonary effort is normal.  Abdominal:     General: There is no distension.  Musculoskeletal:        General: No swelling or deformity.  Skin:    General: Skin is warm.     Capillary Refill: Capillary refill takes less than 2 seconds.     Coloration: Skin is not jaundiced or pale.  Neurological:     Mental Status: She is alert and oriented to person, place, and time.  Psychiatric:        Mood and Affect: Mood normal.        Behavior: Behavior normal.       Assessment & Plan:  With the way it looks today I do not think she is going to need any further surgery.  I recommend continuing with the Vaseline to the scab comes off.  She knows where to find this if she wants to come back.  I do not think she is going to need anything else.  Continue with the compression socks.  Pictures were obtained of the patient and placed in the chart with the patient's or guardian's permission.

## 2021-10-27 DIAGNOSIS — S8001XD Contusion of right knee, subsequent encounter: Secondary | ICD-10-CM | POA: Diagnosis not present

## 2021-10-27 DIAGNOSIS — Z96651 Presence of right artificial knee joint: Secondary | ICD-10-CM | POA: Diagnosis not present

## 2021-10-27 DIAGNOSIS — S81001D Unspecified open wound, right knee, subsequent encounter: Secondary | ICD-10-CM | POA: Diagnosis not present

## 2021-10-27 DIAGNOSIS — M25561 Pain in right knee: Secondary | ICD-10-CM | POA: Diagnosis not present

## 2021-11-09 DIAGNOSIS — R3914 Feeling of incomplete bladder emptying: Secondary | ICD-10-CM | POA: Diagnosis not present

## 2021-11-23 DIAGNOSIS — N3020 Other chronic cystitis without hematuria: Secondary | ICD-10-CM | POA: Diagnosis not present

## 2021-11-23 DIAGNOSIS — R338 Other retention of urine: Secondary | ICD-10-CM | POA: Diagnosis not present

## 2021-12-03 ENCOUNTER — Ambulatory Visit
Admission: RE | Admit: 2021-12-03 | Discharge: 2021-12-03 | Disposition: A | Discharge status: Home / Self Care | Payer: Medicare Other | Source: Ambulatory Visit | Attending: Hematology and Oncology | Admitting: Hematology and Oncology

## 2021-12-03 DIAGNOSIS — Z170 Estrogen receptor positive status [ER+]: Secondary | ICD-10-CM

## 2021-12-03 DIAGNOSIS — M8580 Other specified disorders of bone density and structure, unspecified site: Secondary | ICD-10-CM

## 2021-12-03 DIAGNOSIS — R922 Inconclusive mammogram: Secondary | ICD-10-CM | POA: Diagnosis not present

## 2021-12-03 DIAGNOSIS — C50212 Malignant neoplasm of upper-inner quadrant of left female breast: Secondary | ICD-10-CM

## 2021-12-03 DIAGNOSIS — Z7189 Other specified counseling: Secondary | ICD-10-CM

## 2021-12-07 DIAGNOSIS — R3914 Feeling of incomplete bladder emptying: Secondary | ICD-10-CM | POA: Diagnosis not present

## 2021-12-09 ENCOUNTER — Ambulatory Visit (HOSPITAL_COMMUNITY): Payer: Medicare Other | Attending: Cardiology

## 2021-12-09 DIAGNOSIS — I340 Nonrheumatic mitral (valve) insufficiency: Secondary | ICD-10-CM | POA: Diagnosis not present

## 2021-12-09 LAB — ECHOCARDIOGRAM COMPLETE
Area-P 1/2: 3.19 cm2
MV M vel: 4.49 m/s
MV Peak grad: 80.6 mmHg
P 1/2 time: 369 msec
Radius: 0.43 cm
S' Lateral: 2.9 cm

## 2021-12-15 ENCOUNTER — Encounter: Payer: Self-pay | Admitting: Cardiology

## 2021-12-15 ENCOUNTER — Ambulatory Visit (INDEPENDENT_AMBULATORY_CARE_PROVIDER_SITE_OTHER): Payer: Medicare Other | Admitting: Cardiology

## 2021-12-15 DIAGNOSIS — I5032 Chronic diastolic (congestive) heart failure: Secondary | ICD-10-CM | POA: Diagnosis not present

## 2021-12-15 DIAGNOSIS — I63312 Cerebral infarction due to thrombosis of left middle cerebral artery: Secondary | ICD-10-CM | POA: Diagnosis not present

## 2021-12-15 DIAGNOSIS — Z7901 Long term (current) use of anticoagulants: Secondary | ICD-10-CM

## 2021-12-15 DIAGNOSIS — I482 Chronic atrial fibrillation, unspecified: Secondary | ICD-10-CM

## 2021-12-15 DIAGNOSIS — K5793 Diverticulitis of intestine, part unspecified, without perforation or abscess with bleeding: Secondary | ICD-10-CM | POA: Diagnosis not present

## 2021-12-15 NOTE — Progress Notes (Signed)
?Cardiology Office Note:   ? ?Date:  12/15/2021  ? ?ID:  Adrienne [name], DOB [birth year], MRN [id number] ? ?PCP:  Kathyrn Lass, MD ?  ?[facility] Providers ?Cardiologist:  Candee Furbish, MD    ? ?Referring MD: Kathyrn Lass, MD  ? ? ?History of Present Illness:   ? ?Adrienne [name] is a 88 y.o. female here for follow-up permanent atrial fibrillation nonobstructive CAD by cath in 2013 prior left MCA stroke when she was off of anticoagulation secondary to GI bleed.  Had a hospitalization in December 2022 with heart failure.  Worsening leg edema shortness of breath EF 50 to 60% on echo.  She had 7 L out.  Been doing well since discharge. ? ?Past Medical History:  ?Diagnosis Date  ? Afib ([place])   ? Arthritis of ankle   ? CAD (coronary artery disease)   ? Cancer [facility]) 2012  ? breast cancer-right  ? Compression fracture of T12 vertebra (HCC)   ? Diverticulosis   ? Edema   ? Elevated uric acid in blood   ? GERD (gastroesophageal reflux disease)   ? GI bleed 12/2015  ? Hyperlipidemia   ? Hypertension   ? Long-term (current) use of anticoagulants   ? Moderate mitral regurgitation   ? Personal history of radiation therapy   ? Pulmonary HTN ([place])   ? Echo 1/19: EF 60-65, no RWMA, Ao sclerosis, trivial AI, mild MR, mod BAE, mod TR, PASP 49  ? Stroke ([place]) 01/25/2016  ? TIA (transient ischemic attack) 09/2011  ? Urinary incontinence   ? Urinary retention   ? ? ?Past Surgical History:  ?Procedure Laterality Date  ? APPENDECTOMY    ? AXILLARY SENTINEL NODE BIOPSY Left 01/22/2020  ? Procedure: Left Axillary Sentinel Node Biopsy;  Surgeon: Rolm Bookbinder, MD;  Location: [place];  Service: General;  Laterality: Left;  ? BREAST LUMPECTOMY WITH RADIOACTIVE SEED AND SENTINEL LYMPH NODE BIOPSY Left 01/22/2020  ? Procedure: LEFT BREAST LUMPECTOMY WITH RADIOACTIVE SEED;  Surgeon: Rolm Bookbinder, MD;  Location: [place];  Service: General;  Laterality: Left;  ? BREAST SURGERY  2012  ? Rt.mastectomy--[place]  ? CARDIAC  CATHETERIZATION  2013  ? CATARACT EXTRACTION, BILATERAL    ? CERVICAL CONIZATION W/BX N/A 08/08/2020  ? Procedure: VAGINAL SIDEWALL BIOPSY;  Surgeon: Megan Salon, MD;  Location: [place];  Service: Gynecology;  Laterality: N/A;  ? DILATION AND CURETTAGE OF UTERUS    ? in her early 79's for DUB  ? ESOPHAGOGASTRODUODENOSCOPY Left 01/08/2016  ? Procedure: ESOPHAGOGASTRODUODENOSCOPY (EGD);  Surgeon: Arta Silence, MD;  Location: [facility];  Service: Endoscopy;  Laterality: Left;  ? GALLBLADDER SURGERY    ? MASTECTOMY Right 2012  ? RADIOLOGY WITH ANESTHESIA N/A 01/25/2016  ? Procedure: RADIOLOGY WITH ANESTHESIA;  Surgeon: Luanne Bras, MD;  Location: [place];  Service: Radiology;  Laterality: N/A;  ? REPLACEMENT TOTAL KNEE BILATERAL    ? TONSILLECTOMY AND ADENOIDECTOMY    ? as a child  ? ? ?Current Medications: ?Current Meds  ?Medication Sig  ? acetaminophen (TYLENOL) 500 MG tablet Take 500-1,000 mg by mouth every 12 (twelve) hours as needed (pain).   ? albuterol (VENTOLIN HFA) 108 (90 Base) MCG/ACT inhaler Inhale 1 puff into the lungs every 6 (six) hours as needed for wheezing or shortness of breath (Cough).  ? amoxicillin (AMOXIL) 500 MG capsule Take 2,000 mg by mouth once. Prior to Dental Appointments  ? Ascorbic Acid (VITAMIN C WITH ROSE HIPS) 500 MG tablet Take  500 mg by mouth in the morning and at bedtime.  ? atorvastatin (LIPITOR) 10 MG tablet TAKE 1 TABLET DAILY  ? Calcium Citrate-Vitamin D (SM CALCIUM CITRATE+D3 PETITE PO) Take 1 tablet by mouth in the morning and at bedtime.  ? diltiazem (CARDIZEM) 60 MG tablet TAKE 1 TABLET THREE TIMES A DAY  ? ELIQUIS 5 MG TABS tablet TAKE 1 TABLET TWICE A DAY  ? furosemide (LASIX) 40 MG tablet Take 1 tablet (40 mg total) by mouth daily.  ? metoprolol tartrate (LOPRESSOR) 100 MG tablet TAKE 1 TABLET TWICE A DAY  ? Multiple Vitamin (MULTIVITAMIN WITH MINERALS) TABS tablet Take 1 tablet by mouth daily. Centrum for Women 50+  ? omeprazole (PRILOSEC) 10 MG capsule Take 10 mg  by mouth every other day.  ? potassium chloride SA (KLOR-CON M) 20 MEQ tablet Take 1 tablet (20 mEq total) by mouth daily.  ? Probiotic Product (UP4 PROBIOTICS WOMENS PO) Take 1 capsule by mouth daily.  ?  ? ?Allergies:   Valium [diazepam], Amiodarone hcl [amiodarone], Compazine [prochlorperazine], Other, Thorazine [chlorpromazine], Zometa [zoledronic acid], Ciprofloxacin, and Doxycycline  ? ?Social History  ? ?Socioeconomic History  ? Marital status: Married  ?  Spouse name: Not on file  ? Number of children: Not on file  ? Years of education: Not on file  ? Highest education level: Not on file  ?Occupational History  ? Not on file  ?Tobacco Use  ? Smoking status: Former  ?  Packs/day: 0.25  ?  Years: 2.00  ?  Pack years: 0.50  ?  Types: Cigarettes  ?  Quit date: 48  ?  Years since quitting: 73.3  ? Smokeless tobacco: Never  ?Vaping Use  ? Vaping Use: Never used  ?Substance and Sexual Activity  ? Alcohol use: No  ?  Alcohol/week: 0.0 standard drinks  ? Drug use: No  ? Sexual activity: Not Currently  ?  Partners: Male  ?  Birth control/protection: Post-menopausal  ?Other Topics Concern  ? Not on file  ?Social History Narrative  ? Not on file  ? ?Social Determinants of Health  ? ?Financial Resource Strain: Not on file  ?Food Insecurity: Not on file  ?Transportation Needs: Not on file  ?Physical Activity: Not on file  ?Stress: Not on file  ?Social Connections: Not on file  ?  ? ?Family History: ?The patient's family history includes Asthma in her mother; Atrial fibrillation in her sister; CVA in her father; Cancer in her daughter and maternal grandmother; Pneumonia in her brother. ? ?ROS:   ?Please see the history of present illness.    ? All other systems reviewed and are negative. ? ?EKGs/Labs/Other Studies Reviewed:   ? ?The following studies were reviewed today: ?ECHO 2023: ?Normal pump function.  Moderate mitral valve regurgitation.  No significant change from prior study.  Continue with current management  strategy. ? ?Recent Labs: ?12/21/2020: ALT 10 ?08/17/2021: B Natriuretic Peptide 546.5 ?08/18/2021: Magnesium 2.0 ?08/19/2021: Hemoglobin 10.2; Platelets 282 ?09/03/2021: BUN 22; Creatinine, Ser 1.18; Potassium 4.4; Sodium 138  ?Recent Lipid Panel ?   ?Component Value Date/Time  ? CHOL 128 05/06/2019 1608  ? TRIG 101 05/06/2019 1608  ? HDL 50 05/06/2019 1608  ? CHOLHDL 2.6 05/06/2019 1608  ? CHOLHDL 3.3 01/26/2016 0300  ? VLDL 20 01/26/2016 0300  ? [place] 58 05/06/2019 1608  ? ? ? ?Risk Assessment/Calculations:   ? ?CHA2DS2-VASc Score = 7  ? This indicates a 11.2% annual risk of stroke. ?The patient's score  is based upon: ?CHF History: 1 ?HTN History: 1 ?Diabetes History: 0 ?Stroke History: 2 ?Vascular Disease History: 0 ?Age Score: 2 ?Gender Score: 1 ?  ? ? ?    ? ?   ? ?Physical Exam:   ? ?VS:  BP 136/74   Pulse (!) 54   Ht '5\' 2"'$  (1.575 m)   Wt 143 lb 3.2 oz (65 kg)   LMP 09/12/1973 (Approximate)   SpO2 92%   BMI 26.19 kg/m?    ? ?Wt Readings from Last 3 Encounters:  ?12/15/21 143 lb 3.2 oz (65 kg)  ?10/05/21 138 lb 12.8 oz (63 kg)  ?08/25/21 137 lb 3.2 oz (62.2 kg)  ?  ? ?GEN:  Well nourished, well developed in no acute distress ?HEENT: Normal ?NECK: No JVD; No carotid bruits ?LYMPHATICS: No lymphadenopathy ?CARDIAC: IRRR, no murmurs, no rubs, gallops ?RESPIRATORY:  Clear to auscultation without rales, wheezing or rhonchi  ?ABDOMEN: Soft, non-tender, non-distended ?MUSCULOSKELETAL:  No edema; No deformity  ?SKIN: Warm and dry ?NEUROLOGIC:  Alert and oriented x 3 ?PSYCHIATRIC:  Normal affect  ? ?ASSESSMENT:   ? ?1. Chronic atrial fibrillation (HCC)   ?2. Cerebral infarction due to thrombosis of left middle cerebral artery (HCC) s/p mechanical thrombectomy   ?3. Gastrointestinal hemorrhage associated with intestinal diverticulitis   ?4. Chronic anticoagulation   ?5. Chronic diastolic heart failure ([place])   ? ?PLAN:   ? ?In order of problems listed above: ? ?Chronic atrial fibrillation (HCC) ?Currently well rate  controlled on Cardizem 60 mg 3 times a day and metoprolol 100 mg twice a day.  No changes made. ? ?Cerebral infarction due to thrombosis of left middle cerebral artery (HCC) s/p mechanical thrombectomy ?Prior st

## 2021-12-15 NOTE — Assessment & Plan Note (Signed)
Prior stroke with thrombectomy.  This is when she was off of her Coumadin. ?

## 2021-12-15 NOTE — Patient Instructions (Signed)
Medication Instructions:  ?The current medical regimen is effective;  continue present plan and medications. ? ?*If you need a refill on your cardiac medications before your next appointment, please call your pharmacy* ? ?Follow-Up: ?At [facility], you and your health needs are our priority.  As part of our continuing mission to provide you with exceptional heart care, we have created designated Provider Care Teams.  These Care Teams include your primary Cardiologist (physician) and Advanced Practice Providers (APPs -  Physician Assistants and Nurse Practitioners) who all work together to provide you with the care you need, when you need it. ? ?We recommend signing up for the patient portal called "MyChart".  Sign up information is provided on this After Visit Summary.  MyChart is used to connect with patients for Virtual Visits (Telemedicine).  Patients are able to view lab/test results, encounter notes, upcoming appointments, etc.  Non-urgent messages can be sent to your provider as well.   ?To learn more about what you can do with MyChart, go to [email or web address].   ? ?Your next appointment:   ?6 month(s) ? ?The format for your next appointment:   ?In Person ? ?Provider:   ?Nicholes Rough, PA-C, Melina Copa, PA-C, Ermalinda Barrios, PA-C, Christen Bame, NP, or Richardson Dopp, PA-C       ? ?Thank you for choosing [facility]!! ? ? ? ?

## 2021-12-15 NOTE — Assessment & Plan Note (Signed)
Currently well rate controlled on Cardizem 60 mg 3 times a day and metoprolol 100 mg twice a day.  No changes made. ?

## 2021-12-15 NOTE — Assessment & Plan Note (Signed)
Doing well with Eliquis.  No changes made in medical management.  Hemoglobin 12.2.  Creatinine 1.3.  Stable. ?

## 2021-12-15 NOTE — Assessment & Plan Note (Signed)
Prior history of GI bleed.  She is doing well with Eliquis currently. ?

## 2021-12-15 NOTE — Assessment & Plan Note (Signed)
>>  ASSESSMENT AND PLAN FOR CHRONIC DIASTOLIC HEART FAILURE WRITTEN ON 12/15/2021 11:57 AM BY Anne Fu, MARK C, MD  Echocardiogram recently performed in March 2023 shows normal pump function with moderate mitral regurgitation.  We will continue to monitor.  Overall doing well with her current Lasix dosage of 40 mg daily.  She is watching her daily weights.  It has crept up slightly from her December date.  She is not having any shortness of breath however.

## 2021-12-15 NOTE — Assessment & Plan Note (Signed)
Echocardiogram recently performed in March 2023 shows normal pump function with moderate mitral regurgitation.  We will continue to monitor.  Overall doing well with her current Lasix dosage of 40 mg daily.  She is watching her daily weights.  It has crept up slightly from her December date.  She is not having any shortness of breath however. ?

## 2021-12-27 ENCOUNTER — Other Ambulatory Visit: Payer: Self-pay | Admitting: *Deleted

## 2021-12-27 DIAGNOSIS — C50811 Malignant neoplasm of overlapping sites of right female breast: Secondary | ICD-10-CM

## 2021-12-28 ENCOUNTER — Inpatient Hospital Stay (HOSPITAL_BASED_OUTPATIENT_CLINIC_OR_DEPARTMENT_OTHER): Payer: Medicare Other | Admitting: Hematology and Oncology

## 2021-12-28 ENCOUNTER — Other Ambulatory Visit: Payer: Self-pay

## 2021-12-28 ENCOUNTER — Encounter: Payer: Self-pay | Admitting: Hematology and Oncology

## 2021-12-28 ENCOUNTER — Ambulatory Visit: Payer: Medicare Other | Admitting: Hematology and Oncology

## 2021-12-28 ENCOUNTER — Inpatient Hospital Stay: Payer: Medicare Other | Attending: Hematology and Oncology

## 2021-12-28 ENCOUNTER — Other Ambulatory Visit: Payer: Medicare Other

## 2021-12-28 VITALS — BP 152/62 | HR 57 | Temp 97.8°F | Resp 16 | Ht 62.0 in | Wt 139.6 lb

## 2021-12-28 DIAGNOSIS — C50811 Malignant neoplasm of overlapping sites of right female breast: Secondary | ICD-10-CM | POA: Diagnosis not present

## 2021-12-28 DIAGNOSIS — Z170 Estrogen receptor positive status [ER+]: Secondary | ICD-10-CM

## 2021-12-28 DIAGNOSIS — Z923 Personal history of irradiation: Secondary | ICD-10-CM | POA: Diagnosis not present

## 2021-12-28 DIAGNOSIS — Z853 Personal history of malignant neoplasm of breast: Secondary | ICD-10-CM | POA: Insufficient documentation

## 2021-12-28 LAB — CBC WITH DIFFERENTIAL (CANCER CENTER ONLY)
Abs Immature Granulocytes: 0.02 10*3/uL (ref 0.00–0.07)
Basophils Absolute: 0 10*3/uL (ref 0.0–0.1)
Basophils Relative: 0 %
Eosinophils Absolute: 0.1 10*3/uL (ref 0.0–0.5)
Eosinophils Relative: 1 %
HCT: 38.6 % (ref 36.0–46.0)
Hemoglobin: 12.7 g/dL (ref 12.0–15.0)
Immature Granulocytes: 0 %
Lymphocytes Relative: 18 %
Lymphs Abs: 1.4 10*3/uL (ref 0.7–4.0)
MCH: 29.7 pg (ref 26.0–34.0)
MCHC: 32.9 g/dL (ref 30.0–36.0)
MCV: 90.2 fL (ref 80.0–100.0)
Monocytes Absolute: 0.8 10*3/uL (ref 0.1–1.0)
Monocytes Relative: 10 %
Neutro Abs: 5.6 10*3/uL (ref 1.7–7.7)
Neutrophils Relative %: 71 %
Platelet Count: 228 10*3/uL (ref 150–400)
RBC: 4.28 MIL/uL (ref 3.87–5.11)
RDW: 14.8 % (ref 11.5–15.5)
WBC Count: 8 10*3/uL (ref 4.0–10.5)
nRBC: 0 % (ref 0.0–0.2)

## 2021-12-28 LAB — CMP (CANCER CENTER ONLY)
ALT: 11 U/L (ref 0–44)
AST: 16 U/L (ref 15–41)
Albumin: 4.1 g/dL (ref 3.5–5.0)
Alkaline Phosphatase: 74 U/L (ref 38–126)
Anion gap: 7 (ref 5–15)
BUN: 22 mg/dL (ref 8–23)
CO2: 29 mmol/L (ref 22–32)
Calcium: 9.9 mg/dL (ref 8.9–10.3)
Chloride: 103 mmol/L (ref 98–111)
Creatinine: 1.1 mg/dL — ABNORMAL HIGH (ref 0.44–1.00)
GFR, Estimated: 47 mL/min — ABNORMAL LOW (ref >60)
Glucose, Bld: 129 mg/dL — ABNORMAL HIGH (ref 70–99)
Potassium: 4.4 mmol/L (ref 3.5–5.1)
Sodium: 139 mmol/L (ref 135–145)
Total Bilirubin: 0.6 mg/dL (ref 0.3–1.2)
Total Protein: 7.6 g/dL (ref 6.5–8.1)

## 2021-12-28 NOTE — Progress Notes (Signed)
?[place]  ?[phone] [phone]  ? ? ? ?ID: Adrienne [name] DOB: 1928-07-20  MR#: [id number]  LXB#:[id number] ? ?Patient Care Team: ?Kathyrn Lass, MD as PCP - General (Family Medicine) ?Jerline Pain, MD as PCP - Cardiology (Cardiology) ?Nunzio Cobbs, MD as Consulting Physician (Obstetrics and Gynecology) ?Magrinat, Virgie Dad, MD (Inactive) as Consulting Physician (Oncology) ?Jerline Pain, MD as Consulting Physician (Cardiology) ?Sydnee Levans, MD as Referring Physician (Dermatology) ?Garner Nash, DO as Consulting Physician (Pulmonary Disease) ?Rolm Bookbinder, MD as Consulting Physician (General Surgery) ?Gery Pray, MD as Consulting Physician (Radiation Oncology) ?Benay Pike, MD ?OTHER MD: ? ? ?CHIEF COMPLAINT: Bilateral breast cancer,  estrogen receptor positive (s/p right mastectomy) ? ?CURRENT TREATMENT: Observation ? ?INTERVAL HISTORY: ? ?Adrienne [name] returns today for follow up of her bilateral breast cancers. She continues under observation.  She is accompanied by her husband [name]. ?She had a mammogram recently which was unremarkable, no evidence of malignancy.  The left breast postlumpectomy changes noted.  She otherwise denies any new complaints.  She had a mechanical fall in her kitchen, had a bad right knee hematoma which took forever to heal but has eventually healed.  She denies any changes in bowel habits or urinary habits.  Rest of the pertinent 10 point ROS reviewed and negative ? ? COVID 19 VACCINATION STATUS: Status post [place] x2+ booster October 2021 ? ? ?RIGHT BREAST CANCER HISTORY: ?From the original intake note: ? ?Adrienne [name] has a right breast mass noted by her primary care doctor in [place] in 2012.  She was referred to surgery and underwent right mastectomy and axillary lymph node dissection or sampling (we do not have the pathology report) 06/04/2011 for what appears to have been a  multicentric invasive ductal carcinoma, grade 2.  There were three separate tumor foci, spanning a total of 3.8 cm. Prognostic indicators were: ER positive, PR positive, and Her2 negative. One out of 3 biopsied lymph nodes was positive for micrometastasis.  ? ?LEFT BREAST CANCER HISTORY: ?Adrienne [name] had routine left screening mammography on 11/19/2019 showing a possible abnormality. She underwent left diagnostic mammography with tomography and left breast ultrasonography at [facility] on 12/05/2019 showing: breast density category C; 0.7 cm left breast mass at 11 o'clock; development of three left breast cysts compared to prior imaging from [place] in 11/2017; no left axillary adenopathy. ? ?Accordingly on 12/10/2019 she proceeded to biopsy of the left breast area in question. The pathology from this procedure ([id number]) showed: invasive ductal carcinoma, grade 3; ductal carcinoma in situ. Prognostic indicators significant for: estrogen receptor, 0% negative and progesterone receptor, 0% negative. Proliferation marker Ki67 at 90%. HER2 negative by immunohistochemistry (0). ? ?The patient's subsequent history is as detailed below. ? ? ?PAST MEDICAL HISTORY: ?Past Medical History:  ?Diagnosis Date  ? Afib ([place])   ? Arthritis of ankle   ? CAD (coronary artery disease)   ? Cancer [facility]) 2012  ? breast cancer-right  ? Compression fracture of T12 vertebra (HCC)   ? Diverticulosis   ? Edema   ? Elevated uric acid in blood   ? GERD (gastroesophageal reflux disease)   ? GI bleed 12/2015  ? Hyperlipidemia   ? Hypertension   ? Long-term (current) use of anticoagulants   ? Moderate mitral regurgitation   ? Personal history of radiation therapy   ? Pulmonary HTN ([place])   ? Echo 1/19: EF 60-65, no RWMA, Ao sclerosis, trivial AI, mild MR, mod  BAE, mod TR, PASP 49  ? Stroke ([place]) 01/25/2016  ? TIA (transient ischemic attack) 09/2011  ? Urinary incontinence   ? Urinary retention   ? ? ?PAST SURGICAL HISTORY: ?Past Surgical History:   ?Procedure Laterality Date  ? APPENDECTOMY    ? AXILLARY SENTINEL NODE BIOPSY Left 01/22/2020  ? Procedure: Left Axillary Sentinel Node Biopsy;  Surgeon: Rolm Bookbinder, MD;  Location: [place];  Service: General;  Laterality: Left;  ? BREAST LUMPECTOMY WITH RADIOACTIVE SEED AND SENTINEL LYMPH NODE BIOPSY Left 01/22/2020  ? Procedure: LEFT BREAST LUMPECTOMY WITH RADIOACTIVE SEED;  Surgeon: Rolm Bookbinder, MD;  Location: [place];  Service: General;  Laterality: Left;  ? BREAST SURGERY  2012  ? Rt.mastectomy--[place]  ? CARDIAC CATHETERIZATION  2013  ? CATARACT EXTRACTION, BILATERAL    ? CERVICAL CONIZATION W/BX N/A 08/08/2020  ? Procedure: VAGINAL SIDEWALL BIOPSY;  Surgeon: Megan Salon, MD;  Location: [place];  Service: Gynecology;  Laterality: N/A;  ? DILATION AND CURETTAGE OF UTERUS    ? in her early 83's for DUB  ? ESOPHAGOGASTRODUODENOSCOPY Left 01/08/2016  ? Procedure: ESOPHAGOGASTRODUODENOSCOPY (EGD);  Surgeon: Arta Silence, MD;  Location: [facility];  Service: Endoscopy;  Laterality: Left;  ? GALLBLADDER SURGERY    ? MASTECTOMY Right 2012  ? RADIOLOGY WITH ANESTHESIA N/A 01/25/2016  ? Procedure: RADIOLOGY WITH ANESTHESIA;  Surgeon: Luanne Bras, MD;  Location: [place];  Service: Radiology;  Laterality: N/A;  ? REPLACEMENT TOTAL KNEE BILATERAL    ? TONSILLECTOMY AND ADENOIDECTOMY    ? as a child  ? ? ?FAMILY HISTORY: ?Family History  ?Problem Relation Age of Onset  ? Asthma Mother   ? CVA Father   ? Atrial fibrillation Sister   ?     HAS PACER  ? Pneumonia Brother   ? Cancer Daughter   ?     COLON CANCER  ? Cancer Maternal Grandmother   ?     breast cancer  ?Patient's father was 62 years old when he died from stroke. Patient's mother died from coronary artery disease at age 5. The patient denies a family hx of ovarian cancer. She reports breast cancer in her maternal grandmother. She has 2 siblings, 1 brother and 1 sister. Her brother died from stroke/AIDS. She also reports pancreatic cancer in  her paternal grandmother and colon cancer in her daughter. ? ? ?GYNECOLOGIC HISTORY:  ?Patient's last menstrual period was 09/12/1973 (approximate). ?Menarche: 88 years old ?Age at first live birth: 88 years old ?GX P 3 ?LMP late 12s ?Contraceptive never ?HRT yes, remote , less than 6 months ?Hysterectomy?  No ?BSO?  No ? ? 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  Her husband [name] is a Marketing executive, (PhD, worked at [employer]).  Their children are Lockie Pares who lives in [place] and whose husband is a English as a second language teacher; Blair [employer]") who lives in [place] and works in finances; and B[place], who lives in [place] and works for [employer].  The patient has 11 grandchildren and 8 great-grandchildren.  She attends [employer] locally ?  ? ADVANCED DIRECTIVES: In the absence of documents to the contrary the patient's husband is her healthcare power of attorney ? ? ?HEALTH MAINTENANCE: ?Social History  ? ?Tobacco Use  ? Smoking status: Former  ?  Packs/day: 0.25  ?  Years: 2.00  ?  Pack years: 0.50  ?  Types: Cigarettes  ?  Quit date: 73  ?  Years since quitting: 73.3  ? Smokeless tobacco: Never  ?Vaping Use  ? Vaping Use: Never used  ?Substance Use Topics  ? Alcohol use: No  ?  Alcohol/week: 0.0 standard drinks  ? Drug use: No  ? ? ? Colonoscopy: Remote ? PAP: 02/2019, negative ? Bone density: Osteopenia ?  ?Allergies  ?Allergen Reactions  ? Valium [Diazepam] Other (See Comments)  ?  HYPERACTIVITY ?  ? Amiodarone Hcl [Amiodarone] Other (See Comments)  ?  AFIB  ? Compazine [Prochlorperazine] Other (See Comments)  ?  HYPERACTIVITY   ? Other Other (See Comments)  ?  "Kidney dye"--patient states this gave her severe shakes/chills  ? Thorazine [Chlorpromazine] Other (See Comments)  ?  HYPERACTIVITY   ? Zometa [Zoledronic Acid] Other (See Comments)  ?  PROBLEMS WITH EYES ? ?TIA  ? Ciprofloxacin Swelling  ?   "swelling" in Rt.elbow  ? Doxycycline Nausea Only  ? ? ?Current Outpatient Medications  ?Medication Sig Dispense Refill  ? acetaminophen (TYLENOL) 500 MG tablet Take 500-1,000 mg by mouth every 12 (twelve) hours as neede

## 2022-01-04 DIAGNOSIS — R3914 Feeling of incomplete bladder emptying: Secondary | ICD-10-CM | POA: Diagnosis not present

## 2022-01-05 DIAGNOSIS — R310 Gross hematuria: Secondary | ICD-10-CM | POA: Diagnosis not present

## 2022-01-05 DIAGNOSIS — N3020 Other chronic cystitis without hematuria: Secondary | ICD-10-CM | POA: Diagnosis not present

## 2022-01-18 DIAGNOSIS — K148 Other diseases of tongue: Secondary | ICD-10-CM | POA: Diagnosis not present

## 2022-02-01 DIAGNOSIS — R3914 Feeling of incomplete bladder emptying: Secondary | ICD-10-CM | POA: Diagnosis not present

## 2022-02-11 DIAGNOSIS — K5792 Diverticulitis of intestine, part unspecified, without perforation or abscess without bleeding: Secondary | ICD-10-CM | POA: Diagnosis not present

## 2022-02-14 ENCOUNTER — Telehealth: Payer: Self-pay

## 2022-02-14 NOTE — Telephone Encounter (Signed)
Volunteer check in call made for palliative care, patient doing ok

## 2022-02-16 ENCOUNTER — Other Ambulatory Visit (HOSPITAL_COMMUNITY): Payer: Medicare Other

## 2022-02-17 DIAGNOSIS — S01532A Puncture wound without foreign body of oral cavity, initial encounter: Secondary | ICD-10-CM | POA: Diagnosis not present

## 2022-02-18 DIAGNOSIS — M545 Low back pain, unspecified: Secondary | ICD-10-CM | POA: Diagnosis not present

## 2022-02-18 DIAGNOSIS — M25562 Pain in left knee: Secondary | ICD-10-CM | POA: Diagnosis not present

## 2022-02-23 DIAGNOSIS — Z6829 Body mass index (BMI) 29.0-29.9, adult: Secondary | ICD-10-CM | POA: Diagnosis not present

## 2022-02-23 DIAGNOSIS — I700 Atherosclerosis of aorta: Secondary | ICD-10-CM | POA: Diagnosis not present

## 2022-02-23 DIAGNOSIS — K219 Gastro-esophageal reflux disease without esophagitis: Secondary | ICD-10-CM | POA: Diagnosis not present

## 2022-02-23 DIAGNOSIS — Z978 Presence of other specified devices: Secondary | ICD-10-CM | POA: Diagnosis not present

## 2022-02-23 DIAGNOSIS — M5136 Other intervertebral disc degeneration, lumbar region: Secondary | ICD-10-CM | POA: Diagnosis not present

## 2022-02-23 DIAGNOSIS — N1832 Chronic kidney disease, stage 3b: Secondary | ICD-10-CM | POA: Diagnosis not present

## 2022-02-23 DIAGNOSIS — D6869 Other thrombophilia: Secondary | ICD-10-CM | POA: Diagnosis not present

## 2022-02-23 DIAGNOSIS — I4891 Unspecified atrial fibrillation: Secondary | ICD-10-CM | POA: Diagnosis not present

## 2022-02-23 DIAGNOSIS — R339 Retention of urine, unspecified: Secondary | ICD-10-CM | POA: Diagnosis not present

## 2022-02-23 DIAGNOSIS — Z23 Encounter for immunization: Secondary | ICD-10-CM | POA: Diagnosis not present

## 2022-03-01 DIAGNOSIS — R3914 Feeling of incomplete bladder emptying: Secondary | ICD-10-CM | POA: Diagnosis not present

## 2022-03-16 DIAGNOSIS — R3914 Feeling of incomplete bladder emptying: Secondary | ICD-10-CM | POA: Diagnosis not present

## 2022-03-16 DIAGNOSIS — R350 Frequency of micturition: Secondary | ICD-10-CM | POA: Diagnosis not present

## 2022-03-16 DIAGNOSIS — N3020 Other chronic cystitis without hematuria: Secondary | ICD-10-CM | POA: Diagnosis not present

## 2022-03-16 DIAGNOSIS — N8111 Cystocele, midline: Secondary | ICD-10-CM | POA: Diagnosis not present

## 2022-03-30 DIAGNOSIS — N8111 Cystocele, midline: Secondary | ICD-10-CM | POA: Diagnosis not present

## 2022-03-30 DIAGNOSIS — N939 Abnormal uterine and vaginal bleeding, unspecified: Secondary | ICD-10-CM | POA: Diagnosis not present

## 2022-03-30 DIAGNOSIS — N8112 Cystocele, lateral: Secondary | ICD-10-CM | POA: Diagnosis not present

## 2022-03-31 ENCOUNTER — Other Ambulatory Visit (HOSPITAL_COMMUNITY)
Admission: RE | Admit: 2022-03-31 | Discharge: 2022-03-31 | Disposition: A | Discharge status: Home / Self Care | Payer: Medicare Other | Source: Ambulatory Visit | Attending: Obstetrics and Gynecology | Admitting: Obstetrics and Gynecology

## 2022-03-31 ENCOUNTER — Ambulatory Visit (INDEPENDENT_AMBULATORY_CARE_PROVIDER_SITE_OTHER): Payer: Medicare Other | Admitting: Obstetrics and Gynecology

## 2022-03-31 ENCOUNTER — Encounter: Payer: Self-pay | Admitting: Obstetrics and Gynecology

## 2022-03-31 VITALS — BP 138/88 | HR 68

## 2022-03-31 DIAGNOSIS — I63312 Cerebral infarction due to thrombosis of left middle cerebral artery: Secondary | ICD-10-CM

## 2022-03-31 DIAGNOSIS — N813 Complete uterovaginal prolapse: Secondary | ICD-10-CM

## 2022-03-31 DIAGNOSIS — Z124 Encounter for screening for malignant neoplasm of cervix: Secondary | ICD-10-CM | POA: Diagnosis not present

## 2022-03-31 DIAGNOSIS — N939 Abnormal uterine and vaginal bleeding, unspecified: Secondary | ICD-10-CM | POA: Diagnosis not present

## 2022-03-31 DIAGNOSIS — N952 Postmenopausal atrophic vaginitis: Secondary | ICD-10-CM | POA: Diagnosis not present

## 2022-03-31 NOTE — Progress Notes (Signed)
GYNECOLOGY  VISIT   HPI: 88 y.o.   Married  Caucasian  female   [phone] with Patient's last menstrual period was 09/12/1973 (approximate).   here for PMB. Reports being sent here from urologist. Reports being told of uterine prolapse and cervix being visible by urologist. Pt reports hx of blood vessel in vaginal area needing to be closed due to cause of bleeding and is concerned if it has reopened.   Wears a foley catheter due to urinary retention from prolapse. Sees Dr. Matilde Sprang.  Has blood in her urine from time to time.   She experienced bleeding with wiping two nights ago.  Bleeding stopped spontaneously.  Not having pain.  She is worried that she has recurrent vaginal bleeding from the same area that bled in the past and she is also worried about cancer.  Not using a pessary due to significant bleeding from using this in the past.  Patient was hospitalized for heavy vaginal bleeding, transfused, and went to the operating room for control of the bleeding in November, 2021.  GYNECOLOGIC HISTORY: Patient's last menstrual period was 09/12/1973 (approximate). Contraception: PM Menopausal hormone therapy: none Last mammogram: 12/03/21- birads 2 benign Last pap smear: 03/11/19-WNL        OB History     Gravida  3   Para  3   Term  3   Preterm      AB      Living  3      SAB      IAB      Ectopic      Multiple      Live Births                 Patient Active Problem List   Diagnosis Date Noted   Open knee wound 10/05/2021   Chronic diastolic heart failure ([place]) 08/18/2021   Volume overload 08/17/2021   Palliative care by specialist    Goals of care, counseling/discussion    DNR (do not resuscitate) discussion    Vaginal bleeding 08/08/2020   Uterine prolapse 08/08/2020   Vaginal erosion secondary to pessary use ([place]) 08/08/2020   Acute urinary retention 08/08/2020   Chronic respiratory failure ([place]) 08/08/2020   Multiple pulmonary nodules 05/24/2020    Chronic cough 05/24/2020   Malignant neoplasm of upper-inner quadrant of left breast in female, estrogen receptor negative ([place]) 12/20/2019   Malignant neoplasm of overlapping sites of right breast in female, estrogen receptor positive ([place]) 08/14/2019   Osteopenia 08/14/2019   Diplopia 01/29/2016   GERD (gastroesophageal reflux disease) 01/29/2016   Cerebrovascular accident (CVA) due to embolism of left middle cerebral artery ([place]) 01/29/2016   Bleeding gastrointestinal    Gait disturbance, post-stroke 01/27/2016   Fall    Secondary hypertension, unspecified    Cerebral infarction due to thrombosis of left middle cerebral artery ([place]) s/p mechanical thrombectomy    Malnutrition of moderate degree 01/09/2016   GI bleed 01/06/2016   Chronic atrial fibrillation ([place]) 06/10/2015   Chronic anticoagulation 06/10/2015   Hyperlipidemia 06/10/2015   Essential hypertension 06/10/2015   History of stroke 06/10/2015   Coronary artery disease due to lipid rich plaque 06/10/2015   Elevated uric acid in blood    Arthritis of ankle    Pulmonary HTN ([place])     Past Medical History:  Diagnosis Date   Afib ([place])    Arthritis of ankle    CAD (coronary artery disease)    Cancer ([place]) 2012   breast cancer-right  Compression fracture of T12 vertebra (H[facility])    Diverticulosis    Edema    Elevated uric acid in blood    GERD (gastroesophageal reflux disease)    GI bleed 12/2015   Hyperlipidemia    Hypertension    Long-term (current) use of anticoagulants    Moderate mitral regurgitation    Personal history of radiation therapy    Pulmonary HTN ([place])    Echo 1/19: EF 60-65, no RWMA, Ao sclerosis, trivial AI, mild MR, mod BAE, mod TR, PASP 49   Stroke ([place]) 01/25/2016   TIA (transient ischemic attack) 09/2011   Urinary incontinence    Urinary retention     Past Surgical History:  Procedure Laterality Date   APPENDECTOMY     AXILLARY SENTINEL NODE BIOPSY Left 01/22/2020   Procedure: Left  Axillary Sentinel Node Biopsy;  Surgeon: Rolm Bookbinder, MD;  Location: [place];  Service: General;  Laterality: Left;   BREAST LUMPECTOMY WITH RADIOACTIVE SEED AND SENTINEL LYMPH NODE BIOPSY Left 01/22/2020   Procedure: LEFT BREAST LUMPECTOMY WITH RADIOACTIVE SEED;  Surgeon: Rolm Bookbinder, MD;  Location: [place];  Service: General;  Laterality: Left;   BREAST SURGERY  2012   Rt.mastectomy--[place], [place] CATHETERIZATION  2013   CATARACT EXTRACTION, BILATERAL     CERVICAL CONIZATION W/BX N/A 08/08/2020   Procedure: VAGINAL SIDEWALL BIOPSY;  Surgeon: Megan Salon, MD;  Location: [facility];  Service: Gynecology;  Laterality: N/A;   DILATION AND CURETTAGE OF UTERUS     in her early 60's for DUB   ESOPHAGOGASTRODUODENOSCOPY Left 01/08/2016   Procedure: ESOPHAGOGASTRODUODENOSCOPY (EGD);  Surgeon: Arta Silence, MD;  Location[facility];  Service: Endoscopy;  Laterality: Left;   GALLBLADDER SURGERY     MASTECTOMY Right 2012   RADIOLOGY WITH ANESTHESIA N/A 01/25/2016   Procedure: RADIOLOGY WITH ANESTHESIA;  Surgeon: Luanne Bras, MD;  Location: [facility];  Service: Radiology;  Laterality: N/A;   REPLACEMENT TOTAL KNEE BILATERAL     TONSILLECTOMY AND ADENOIDECTOMY     as a child    Current Outpatient Medications  Medication Sig Dispense Refill   acetaminophen (TYLENOL) 500 MG tablet Take 500-1,000 mg by mouth every 12 (twelve) hours as needed (pain).      albuterol (VENTOLIN HFA) 108 (90 Base) MCG/ACT inhaler Inhale 1 puff into the lungs every 6 (six) hours as needed for wheezing or shortness of breath (Cough). 18 g 2   Ascorbic Acid (VITAMIN C WITH ROSE HIPS) 500 MG tablet Take 500 mg by mouth in the morning and at bedtime.     atorvastatin (LIPITOR) 10 MG tablet TAKE 1 TABLET DAILY 90 tablet 3   diltiazem (CARDIZEM) 60 MG tablet TAKE 1 TABLET THREE TIMES A DAY 270 tablet 3   ELIQUIS 5 MG TABS tablet TAKE 1 TABLET TWICE A DAY 180 tablet 3   furosemide (LASIX) 40 MG tablet Take 1  tablet (40 mg total) by mouth daily. 90 tablet 3   metoprolol tartrate (LOPRESSOR) 100 MG tablet TAKE 1 TABLET TWICE A DAY 180 tablet 3   Multiple Vitamin (MULTIVITAMIN WITH MINERALS) TABS tablet Take 1 tablet by mouth daily. Centrum for Women 50+     omeprazole (PRILOSEC) 10 MG capsule Take 10 mg by mouth every other day.     Potassium Chloride ER 20 MEQ TBCR Take 1 tablet by mouth daily.     Probiotic Product (UP4 PROBIOTICS WOMENS PO) Take 1 capsule by mouth daily.     No current facility-administered  medications for this visit.     ALLERGIES: Valium [diazepam], Amiodarone hcl [amiodarone], Compazine [prochlorperazine], Other, Thorazine [chlorpromazine], Zometa [zoledronic acid], Ciprofloxacin, and Doxycycline  Family History  Problem Relation Age of Onset   Asthma Mother    CVA Father    Atrial fibrillation Sister        HAS PACER   Pneumonia Brother    Cancer Daughter        COLON CANCER   Cancer Maternal Grandmother        breast cancer    Social History   Socioeconomic History   Marital status: Married    Spouse name: Not on file   Number of children: Not on file   Years of education: Not on file   Highest education level: Not on file  Occupational History   Not on file  Tobacco Use   Smoking status: Former    Packs/day: 0.25    Years: 2.00    Total pack years: 0.50    Types: Cigarettes    Quit date: 1950    Years since quitting: 73.5   Smokeless tobacco: Never  Vaping Use   Vaping Use: Never used  Substance and Sexual Activity   Alcohol use: No    Alcohol/week: 0.0 standard drinks of alcohol   Drug use: No   Sexual activity: Not Currently    Partners: Male    Birth control/protection: Post-menopausal  Other Topics Concern   Not on file  Social History Narrative   Not on file   Social Determinants of Health   Financial Resource Strain: Not on file  Food Insecurity: Not on file  Transportation Needs: Not on file  Physical Activity: Not on file   Stress: Not on file  Social Connections: Not on file  Intimate Partner Violence: Not on file    Review of Systems  Genitourinary:  Positive for vaginal bleeding.  All other systems reviewed and are negative.   PHYSICAL EXAMINATION:    BP 138/88   Pulse 68   LMP 09/12/1973 (Approximate)   SpO2 94%     General appearance: alert, cooperative and appears stated age   Pelvic: External genitalia:  no lesions of the vulva.              Urethra:  urethral prolapse and foley catheter in place.               Bartholins and Skenes: normal                 Vagina:  Uterine procidentia.  Ulceration of right vaginal sidewall mucosa.  Not bleeding.               Cervix: no lesions.  Bleeds with pap.                Bimanual Exam:  Uterus:  normal size, contour, position, consistency, mobility, non-tender              Adnexa: no mass, fullness, tenderness              Rectal exam: Yes.  .  Confirms.              Anus:  normal sphincter tone, no lesions  Chaperone was present for exam:  Lovena Le, CMA  ASSESSMENT  Uterine procidentia.  Urinary retention.  Vaginal bleeding.   Looks like atrophy and healing ulceration of the exposed vaginal mucosa.  Cervical cancer screening.  On Eliquis.   PLAN  Patient educated about  her anatomy using a handheld mirror.  Pap and reflex HR HPV collected.  I recommended hydrating the vaginal mucosa with Ky Jelly, Vaseline, or cooking oil. She will return for a pelvic ultrasound.    An After Visit Summary was printed and given to the patient.  31 min  total time was spent for this patient encounter, including preparation, face-to-face counseling with the patient, coordination of care, and documentation of the encounter.

## 2022-04-01 ENCOUNTER — Other Ambulatory Visit: Payer: Self-pay

## 2022-04-01 DIAGNOSIS — N950 Postmenopausal bleeding: Secondary | ICD-10-CM

## 2022-04-04 LAB — CYTOLOGY - PAP: Diagnosis: NEGATIVE

## 2022-04-19 DIAGNOSIS — R3914 Feeling of incomplete bladder emptying: Secondary | ICD-10-CM | POA: Diagnosis not present

## 2022-05-03 NOTE — Progress Notes (Signed)
GYNECOLOGY  VISIT   HPI: 88 y.o.   Married  Caucasian  female   [phone] with Patient's last menstrual period was 09/12/1973 (approximate).   here for pelvic ultrasound.   Patient has procidentia for many years, and has had vaginal bleeding.  She has known vaginal dryness and some minor mucosal breakdown.  Has used [employer] and then coconut oil to moisturize the vaginal mucosa.   She stopped using a pessary about 2 years ago when she had significant vaginal bleeding due the vaginal ulceration from pessary use.  She wears a foley catheter due to retention from the procidentia and she sees urology regularly for catheter care.   She is on Eliquis.   GYNECOLOGIC HISTORY: Patient's last menstrual period was 09/12/1973 (approximate). Contraception:  PMP Menopausal hormone therapy:  none Last mammogram:  12/03/21- birads 2 benign Last pap smear:  03-31-22 Neg, 03/11/19-WNL        OB History     Gravida  3   Para  3   Term  3   Preterm      AB      Living  3      SAB      IAB      Ectopic      Multiple      Live Births                 Patient Active Problem List   Diagnosis Date Noted   Open knee wound 10/05/2021   Chronic diastolic heart failure ([place]) 08/18/2021   Volume overload 08/17/2021   Palliative care by specialist    Goals of care, counseling/discussion    DNR (do not resuscitate) discussion    Vaginal bleeding 08/08/2020   Uterine prolapse 08/08/2020   Vaginal erosion secondary to pessary use ([place]) 08/08/2020   Acute urinary retention 08/08/2020   Chronic respiratory failure ([place]) 08/08/2020   Multiple pulmonary nodules 05/24/2020   Chronic cough 05/24/2020   Malignant neoplasm of upper-inner quadrant of left breast in female, estrogen receptor negative ([place]) 12/20/2019   Malignant neoplasm of overlapping sites of right breast in female, estrogen receptor positive ([place]) 08/14/2019   Osteopenia 08/14/2019   Diplopia 01/29/2016   GERD  (gastroesophageal reflux disease) 01/29/2016   Cerebrovascular accident (CVA) due to embolism of left middle cerebral artery ([place]) 01/29/2016   Bleeding gastrointestinal    Gait disturbance, post-stroke 01/27/2016   Fall    Secondary hypertension, unspecified    Cerebral infarction due to thrombosis of left middle cerebral artery ([place]) s/p mechanical thrombectomy    Malnutrition of moderate degree 01/09/2016   GI bleed 01/06/2016   Chronic atrial fibrillation ([place]) 06/10/2015   Chronic anticoagulation 06/10/2015   Hyperlipidemia 06/10/2015   Essential hypertension 06/10/2015   History of stroke 06/10/2015   Coronary artery disease due to lipid rich plaque 06/10/2015   Elevated uric acid in blood    Arthritis of ankle    Pulmonary HTN ([place])     Past Medical History:  Diagnosis Date   Afib ([place])    Arthritis of ankle    CAD (coronary artery disease)    Cancer ([place]) 2012   breast cancer-right   Compression fracture of T12 vertebra ([facility])    Diverticulosis    Edema    Elevated uric acid in blood    GERD (gastroesophageal reflux disease)    GI bleed 12/2015   Hyperlipidemia    Hypertension    Long-term (current) use of anticoagulants  Moderate mitral regurgitation    Personal history of radiation therapy    Pulmonary HTN ([place])    Echo 1/19: EF 60-65, no RWMA, Ao sclerosis, trivial AI, mild MR, mod BAE, mod TR, PASP 49   Stroke ([place]) 01/25/2016   TIA (transient ischemic attack) 09/2011   Urinary incontinence    Urinary retention     Past Surgical History:  Procedure Laterality Date   APPENDECTOMY     AXILLARY SENTINEL NODE BIOPSY Left 01/22/2020   Procedure: Left Axillary Sentinel Node Biopsy;  Surgeon: Rolm Bookbinder, MD;  Location: [facility];  Service: General;  Laterality: Left;   BREAST LUMPECTOMY WITH RADIOACTIVE SEED AND SENTINEL LYMPH NODE BIOPSY Left 01/22/2020   Procedure: LEFT BREAST LUMPECTOMY WITH RADIOACTIVE SEED;  Surgeon: Rolm Bookbinder, MD;  Location:  [place];  Service: General;  Laterality: Left;   BREAST SURGERY  2012   Rt.mastectomy--[place], [place] CATHETERIZATION  2013   CATARACT EXTRACTION, BILATERAL     CERVICAL CONIZATION W/BX N/A 08/08/2020   Procedure: VAGINAL SIDEWALL BIOPSY;  Surgeon: Megan Salon, MD;  Location: [facility];  Service: Gynecology;  Laterality: N/A;   DILATION AND CURETTAGE OF UTERUS     in her early 49's for DUB   ESOPHAGOGASTRODUODENOSCOPY Left 01/08/2016   Procedure: ESOPHAGOGASTRODUODENOSCOPY (EGD);  Surgeon: Arta Silence, MD;  Location[facility];  Service: Endoscopy;  Laterality: Left;   GALLBLADDER SURGERY     MASTECTOMY Right 2012   RADIOLOGY WITH ANESTHESIA N/A 01/25/2016   Procedure: RADIOLOGY WITH ANESTHESIA;  Surgeon: Luanne Bras, MD;  Location: [facility];  Service: Radiology;  Laterality: N/A;   REPLACEMENT TOTAL KNEE BILATERAL     TONSILLECTOMY AND ADENOIDECTOMY     as a child    Current Outpatient Medications  Medication Sig Dispense Refill   acetaminophen (TYLENOL) 500 MG tablet Take 500-1,000 mg by mouth every 12 (twelve) hours as needed (pain).      albuterol (VENTOLIN HFA) 108 (90 Base) MCG/ACT inhaler Inhale 1 puff into the lungs every 6 (six) hours as needed for wheezing or shortness of breath (Cough). 18 g 2   Ascorbic Acid (VITAMIN C WITH ROSE HIPS) 500 MG tablet Take 500 mg by mouth in the morning and at bedtime.     atorvastatin (LIPITOR) 10 MG tablet TAKE 1 TABLET DAILY 90 tablet 3   diltiazem (CARDIZEM) 60 MG tablet TAKE 1 TABLET THREE TIMES A DAY 270 tablet 3   ELIQUIS 5 MG TABS tablet TAKE 1 TABLET TWICE A DAY 180 tablet 3   furosemide (LASIX) 40 MG tablet Take 1 tablet (40 mg total) by mouth daily. 90 tablet 3   metoprolol tartrate (LOPRESSOR) 100 MG tablet TAKE 1 TABLET TWICE A DAY 180 tablet 3   Multiple Vitamin (MULTIVITAMIN WITH MINERALS) TABS tablet Take 1 tablet by mouth daily. Centrum for Women 50+     omeprazole (PRILOSEC) 10 MG capsule Take 10 mg by mouth  every other day.     Potassium Chloride ER 20 MEQ TBCR Take 1 tablet by mouth daily.     Probiotic Product (UP4 PROBIOTICS WOMENS PO) Take 1 capsule by mouth daily.     No current facility-administered medications for this visit.     ALLERGIES: Valium [diazepam], Amiodarone hcl [amiodarone], Compazine [prochlorperazine], Other, Thorazine [chlorpromazine], Zometa [zoledronic acid], Ciprofloxacin, and Doxycycline  Family History  Problem Relation Age of Onset   Asthma Mother    CVA Father    Atrial fibrillation Sister  HAS PACER   Pneumonia Brother    Cancer Daughter        COLON CANCER   Cancer Maternal Grandmother        breast cancer    Social History   Socioeconomic History   Marital status: Married    Spouse name: Not on file   Number of children: Not on file   Years of education: Not on file   Highest education level: Not on file  Occupational History   Not on file  Tobacco Use   Smoking status: Former    Packs/day: 0.25    Years: 2.00    Total pack years: 0.50    Types: Cigarettes    Quit date: 73    Years since quitting: 73.6   Smokeless tobacco: Never  Vaping Use   Vaping Use: Never used  Substance and Sexual Activity   Alcohol use: No    Alcohol/week: 0.0 standard drinks of alcohol   Drug use: No   Sexual activity: Not Currently    Partners: Male    Birth control/protection: Post-menopausal  Other Topics Concern   Not on file  Social History Narrative   Not on file   Social Determinants of Health   Financial Resource Strain: Not on file  Food Insecurity: Not on file  Transportation Needs: Not on file  Physical Activity: Not on file  Stress: Not on file  Social Connections: Not on file  Intimate Partner Violence: Not on file    Review of Systems  All other systems reviewed and are negative.   PHYSICAL EXAMINATION:    BP (!) 140/76   Ht 5' 0.5" (1.537 m)   Wt 139 lb (63 kg)   LMP 09/12/1973 (Approximate)   BMI 26.70 kg/m      General appearance: alert, cooperative and appears stated age   Pelvic US Uterus 6.04 x 3.42 x 2.52 cm.  Peripheral calcifications noted. EMS 1.64 mm. Left ovary 1.87 x 1.20 x 0.72 cm.  Right ovary 1.49 x 0.73 x 0.52 cm.  No adnexal masses.  Small amount of free fluid.  ASSESSMENT  Procidentia of uterus.  Postmenopausal bleeding.  Has vaginal atrophy.  Hx breast cancer.  On Eliquis.   PLAN  We reviewed her pelvic ultrasound images and report.  Reassurance given.  No endometrial biopsy needed. Try vitamin E liquid and Vaseline to hydrate the vaginal skin.  Patient is not a candidate for use of local vaginal estrogen.  Fu yearly and prn.    An After Visit Summary was printed and given to the patient.

## 2022-05-04 ENCOUNTER — Ambulatory Visit (INDEPENDENT_AMBULATORY_CARE_PROVIDER_SITE_OTHER): Payer: Medicare Other | Admitting: Obstetrics and Gynecology

## 2022-05-04 ENCOUNTER — Ambulatory Visit (INDEPENDENT_AMBULATORY_CARE_PROVIDER_SITE_OTHER): Payer: Medicare Other

## 2022-05-04 VITALS — BP 140/76 | Ht 60.5 in | Wt 139.0 lb

## 2022-05-04 DIAGNOSIS — N950 Postmenopausal bleeding: Secondary | ICD-10-CM | POA: Diagnosis not present

## 2022-05-04 DIAGNOSIS — I63312 Cerebral infarction due to thrombosis of left middle cerebral artery: Secondary | ICD-10-CM

## 2022-05-04 DIAGNOSIS — N813 Complete uterovaginal prolapse: Secondary | ICD-10-CM

## 2022-05-07 ENCOUNTER — Encounter: Payer: Self-pay | Admitting: Obstetrics and Gynecology

## 2022-05-24 ENCOUNTER — Emergency Department (HOSPITAL_BASED_OUTPATIENT_CLINIC_OR_DEPARTMENT_OTHER): Payer: Medicare Other | Admitting: Radiology

## 2022-05-24 ENCOUNTER — Encounter (HOSPITAL_BASED_OUTPATIENT_CLINIC_OR_DEPARTMENT_OTHER): Payer: Self-pay

## 2022-05-24 ENCOUNTER — Other Ambulatory Visit: Payer: Self-pay

## 2022-05-24 ENCOUNTER — Emergency Department (HOSPITAL_BASED_OUTPATIENT_CLINIC_OR_DEPARTMENT_OTHER)
Admission: EM | Admit: 2022-05-24 | Discharge: 2022-05-24 | Disposition: A | Discharge status: Home / Self Care | Payer: Medicare Other | Attending: Emergency Medicine | Admitting: Emergency Medicine

## 2022-05-24 DIAGNOSIS — W19XXXA Unspecified fall, initial encounter: Secondary | ICD-10-CM | POA: Diagnosis not present

## 2022-05-24 DIAGNOSIS — X58XXXA Exposure to other specified factors, initial encounter: Secondary | ICD-10-CM | POA: Diagnosis not present

## 2022-05-24 DIAGNOSIS — S8992XA Unspecified injury of left lower leg, initial encounter: Secondary | ICD-10-CM | POA: Diagnosis present

## 2022-05-24 DIAGNOSIS — R509 Fever, unspecified: Secondary | ICD-10-CM | POA: Diagnosis not present

## 2022-05-24 DIAGNOSIS — G4489 Other headache syndrome: Secondary | ICD-10-CM | POA: Diagnosis not present

## 2022-05-24 DIAGNOSIS — I4891 Unspecified atrial fibrillation: Secondary | ICD-10-CM | POA: Diagnosis not present

## 2022-05-24 DIAGNOSIS — Z7901 Long term (current) use of anticoagulants: Secondary | ICD-10-CM | POA: Diagnosis not present

## 2022-05-24 DIAGNOSIS — R112 Nausea with vomiting, unspecified: Secondary | ICD-10-CM | POA: Diagnosis not present

## 2022-05-24 DIAGNOSIS — S81812A Laceration without foreign body, left lower leg, initial encounter: Secondary | ICD-10-CM | POA: Insufficient documentation

## 2022-05-24 DIAGNOSIS — M7989 Other specified soft tissue disorders: Secondary | ICD-10-CM | POA: Diagnosis not present

## 2022-05-24 DIAGNOSIS — R42 Dizziness and giddiness: Secondary | ICD-10-CM | POA: Diagnosis not present

## 2022-05-24 DIAGNOSIS — I83892 Varicose veins of left lower extremities with other complications: Secondary | ICD-10-CM | POA: Insufficient documentation

## 2022-05-24 DIAGNOSIS — M25572 Pain in left ankle and joints of left foot: Secondary | ICD-10-CM | POA: Diagnosis not present

## 2022-05-24 MED ORDER — LIDOCAINE-EPINEPHRINE (PF) 2 %-1:200000 IJ SOLN
10.0000 mL | Freq: Once | INTRAMUSCULAR | Status: DC
  Filled 2022-05-24: qty 20

## 2022-05-24 NOTE — ED Triage Notes (Signed)
Pt arrived via [facility] c/o laceration to lower left leg. Pt states she was toweling off after a shower when she noticed her leg bleeding. Pt takes Eliquis. Pt denies falls, trauma, LOC, N/V/D, visual changes. Pt denies pain. CAOx4. Moderate edema noted to pt's left ankle. Pedal pulses present and strong.

## 2022-05-24 NOTE — ED Provider Notes (Addendum)
[facility] EMERGENC[facility] DEPT Provider Note   CSN: [id number] Arrival date & time: 05/24/22  4010     History  Chief Complaint  Patient presents with  . Laceration    Adrienne [name] is a 88 y.o. female.   Laceration    88 year old female with medical history significant for prior CVA and chronic atrial fibrillation on Eliquis who presents to the emergency department with uncontrolled bleeding in the left lower extremity.  The patient states that she had no injury and sustained no wound.  She was in the shower and got on the shower and noticed that her left leg had started spontaneously bleeding.  She does have varicose veins in the left lower extremity.  She states that she has had left lower extremity swelling for the past several years.  She denies any recent falls or trauma.  She was unable to achieve hemostasis.  She thought she saw arterial "spurting".  Per EMS when they arrived they noticed persistent venous oozing.  She arrived to the emergency department GCS 15, ABC intact. Her last tetanus was in 2016. She did not take her morning Eliquis dose.   Home Medications Prior to Admission medications   Medication Sig Start Date End Date Taking? Authorizing Provider  acetaminophen (TYLENOL) 500 MG tablet Take 500-1,000 mg by mouth every 12 (twelve) hours as needed (pain).     [provider]  albuterol (VENTOLIN HFA) 108 (90 Base) MCG/ACT inhaler Inhale 1 puff into the lungs every 6 (six) hours as needed for wheezing or shortness of breath (Cough). 05/13/20   Martyn Ehrich, NP  Ascorbic Acid (VITAMIN C WITH ROSE HIPS) 500 MG tablet Take 500 mg by mouth in the morning and at bedtime.    [provider]  atorvastatin (LIPITOR) 10 MG tablet TAKE 1 TABLET DAILY 09/02/21   Jerline Pain, MD  diltiazem (CARDIZEM) 60 MG tablet TAKE 1 TABLET THREE TIMES A DAY 09/02/21   Jerline Pain, MD  ELIQUIS 5 MG TABS tablet TAKE 1 TABLET TWICE A DAY 06/04/21    Jerline Pain, MD  furosemide (LASIX) 40 MG tablet Take 1 tablet (40 mg total) by mouth daily. 09/15/21   Jerline Pain, MD  metoprolol tartrate (LOPRESSOR) 100 MG tablet TAKE 1 TABLET TWICE A DAY 09/02/21   Jerline Pain, MD  Multiple Vitamin (MULTIVITAMIN WITH MINERALS) TABS tablet Take 1 tablet by mouth daily. Centrum for Women 50+    [provider]  omeprazole (PRILOSEC) 10 MG capsule Take 10 mg by mouth every other day. 03/21/19   [provider]  Potassium Chloride ER 20 MEQ TBCR Take 1 tablet by mouth daily. 01/16/22   [provider]  Probiotic Product (UP4 PROBIOTICS WOMENS PO) Take 1 capsule by mouth daily.    [provider]      Allergies    Valium [diazepam], Amiodarone hcl [amiodarone], Compazine [prochlorperazine], Other, Thorazine [chlorpromazine], Zometa [zoledronic acid], Ciprofloxacin, and Doxycycline    Review of Systems   Review of Systems  Skin:  Positive for wound.  All other systems reviewed and are negative.   Physical Exam Updated Vital Signs BP (!) 179/87   Pulse 68   Temp 98.2 F (36.8 C) (Oral)   Resp 16   Wt 59.4 kg   LMP 09/12/1973 (Approximate)   SpO2 96%   BMI 25.16 kg/m  Physical Exam Vitals and nursing note reviewed.  Constitutional:      General: She is not in  acute distress. HENT:     Head: Normocephalic and atraumatic.  Eyes:     Conjunctiva/sclera: Conjunctivae normal.     Pupils: Pupils are equal, round, and reactive to light.  Cardiovascular:     Rate and Rhythm: Normal rate and regular rhythm.  Pulmonary:     Effort: Pulmonary effort is normal. No respiratory distress.  Abdominal:     General: There is no distension.     Tenderness: There is no guarding.  Musculoskeletal:        General: Swelling and tenderness present. No deformity or signs of injury.     Cervical back: Neck supple.     Comments: Tiny punctate left lower extremity wound, actively oozing venous blood, pressure dressing  applied.  Lower extremity neurovascular intact with 2+ DP pulses.  2+ pitting edema to the left lower extremity. Mild TTP about the lateral malleolus  Skin:    Findings: No lesion or rash.  Neurological:     General: No focal deficit present.     Mental Status: She is alert. Mental status is at baseline.       ED Results / Procedures / Treatments   Labs (all labs ordered are listed, but only abnormal results are displayed) Labs Reviewed - No data to display  EKG None  Radiology DG Ankle Complete Left  Result Date: 05/24/2022 CLINICAL DATA:  Pain and swelling EXAM: LEFT ANKLE COMPLETE - 3+ VIEW COMPARISON:  None Available. FINDINGS: No recent displaced fracture or dislocation is seen. Osteopenia is seen in bony structures. Plantar spur is seen in calcaneus. Extensive arterial calcifications are seen in soft tissues. There is marked soft tissue swelling around the ankle, more so over the lateral malleolus. There are no opaque foreign bodies. IMPRESSION: No fracture or dislocation is seen in left ankle. Osteopenia. There is marked soft tissue swelling around the ankle, more so over the lateral malleolus. Electronically Signed   By: Elmer Picker M.D.   On: 05/24/2022 09:03    Procedures .Marland KitchenLaceration Repair  Date/Time: 05/24/2022 9:51 AM  Performed by: Regan Lemming, MD Authorized by: Regan Lemming, MD   Consent:    Consent obtained:  Verbal   Consent given by:  Patient   Risks discussed:  Need for additional repair Universal protocol:    Patient identity confirmed:  Verbally with patient and arm band Anesthesia:    Anesthesia method:  None Laceration details:    Location:  Leg   Leg location:  L lower leg   Length (cm):  0.5 Skin repair:    Repair method:  Tissue adhesive Approximation:    Approximation:  Close Repair type:    Repair type:  Simple Post-procedure details:    Dressing:  Non-adherent dressing   Procedure completion:  Tolerated     Medications  Ordered in ED Medications  lidocaine-EPINEPHrine (XYLOCAINE W/EPI) 2 %-1:200000 (PF) injection 10 mL (10 mLs Infiltration Not Given 05/24/22 3546)    ED Course/ Medical Decision Making/ A&P                           Medical Decision Making Amount and/or Complexity of Data Reviewed Radiology: ordered.    88 year old female with medical history significant for prior CVA and chronic atrial fibrillation on Eliquis who presents to the emergency department with uncontrolled bleeding in the left lower extremity.  The patient states that she had no injury and sustained no wound.  She was in the shower and got  on the shower and noticed that her left leg had started spontaneously bleeding.  She does have varicose veins in the left lower extremity.  She states that she has had left lower extremity swelling for the past several years.  She denies any recent falls or trauma.  She was unable to achieve hemostasis.  She thought she saw arterial "spurting".  Per EMS when they arrived they noticed persistent venous oozing.  She arrived to the emergency department GCS 15, ABC intact. Her last tetanus was in 2016. She did not take her morning Eliquis dose.   On arrival, the patient was vitally stable.  No symptoms of anemia, mild blood loss noted.  She does have a physical exam concerning for a small punctate bleeding varicose vein.   A pressure dressing was applied with subsequent hemostasis achieved.  Edema to the left ankle noted.  Neurovascular intact to the left lower extremity.  X-ray imaging revealed no evidence of traumatic injury, soft tissue swelling was noted.  Patient has had chronic left lower extremity swelling for several years and is on anticoagulation.  Low concern for DVT.  Symptoms appear more consistent with lymphedema.   Dermabond was applied and a nonadhesive compressor dressing was reapplied to the patient's left lower extremity.  She was advised to follow-up outpatient for further management  of her varicose veins with vascular surgery.  Okay to restart her Eliquis at this point.  Stable for discharge.   Final Clinical Impression(s) / ED Diagnoses Final diagnoses:  Bleeding from varicose veins of left lower extremity    Rx / DC Orders ED Discharge Orders          Ordered    Ambulatory referral to Vascular Surgery        05/24/22 0950              Regan Lemming, MD 05/24/22 4097    Regan Lemming, MD 06/20/22 1743

## 2022-05-24 NOTE — Discharge Instructions (Addendum)
Recommend elevation of the left lower extremity, compression dressing for the next three days (change daily), follow-up outpatient with vascular surgery for routine management of your varicose veins.

## 2022-05-27 ENCOUNTER — Telehealth: Payer: Self-pay | Admitting: *Deleted

## 2022-05-27 DIAGNOSIS — R338 Other retention of urine: Secondary | ICD-10-CM | POA: Diagnosis not present

## 2022-05-27 NOTE — Telephone Encounter (Signed)
      Patient  visit on 05/24/2022  at [facility] ed was for bleeding   Have you been able to follow up with your primary care physician? yes The patient was able to obtain any needed medicine or equipment.  Are there diet recommendations that you are having difficulty following?  Patient expresses understanding of discharge instructions and education provided has no other needs at this time.    [facility] [phone] [address] , [place] [address] Email : [email or web address]

## 2022-05-30 NOTE — Progress Notes (Unsigned)
[facility]  ASSESSMENT / PLAN: Adrienne [name] is a 88 y.o. female with chronic venous insufficiency of left lower extremity causing spontaneous bleeding from (C4c disease).  Venous duplex is significant for deep venous reflux, without greater saphenous vein incompetence. Recommend compression and elevation for symptomatic relief. She would prefer to avoid an intervention at this time.  I think this is reasonable.  She has noticed a follow-up as needed should further bleeding or other issues develop.  CHIEF COMPLAINT: Spontaneous bleeding from reticular veins about the left ankle  HISTORY OF PRESENT ILLNESS: Adrienne [name] is a 88 y.o. female who presents to clinic as a referral from the [facility] emergency department for evaluation of left lower extremity spontaneous bleeding from a reticular vein.  The patient reports a chronic history of left lower extremity swelling and reticular veins.  This has been minimally symptomatic to her.  She was in the shower recently, and noticed spontaneous brisk bleeding from the left ankle without trauma.  She was not able to stop the bleeding at home and so EMS was called.  Hemostasis was achieved in the [facility], ER.  [name] and I reviewed the natural history of chronic venous insufficiency, and the rationale for intervention.  Past Medical History:  Diagnosis Date   Afib ([place])    Arthritis of ankle    CAD (coronary artery disease)    Cancer ([place]) 2012   breast cancer-right   Compression fracture of T12 vertebra ([facility])    Diverticulosis    Edema    Elevated uric acid in blood    GERD (gastroesophageal reflux disease)    GI bleed 12/2015   Hyperlipidemia    Hypertension    Long-term (current) use of anticoagulants    Moderate mitral regurgitation    Personal history of radiation therapy    Pulmonary HTN ([place])    Echo 1/19: EF 60-65, no RWMA, Ao sclerosis, trivial AI, mild MR, mod BAE, mod  TR, PASP 49   Stroke ([place]) 01/25/2016   TIA (transient ischemic attack) 09/2011   Urinary incontinence    Urinary retention     Past Surgical History:  Procedure Laterality Date   APPENDECTOMY     AXILLARY SENTINEL NODE BIOPSY Left 01/22/2020   Procedure: Left Axillary Sentinel Node Biopsy;  Surgeon: Rolm Bookbinder, MD;  Location: [facility];  Service: General;  Laterality: Left;   BREAST LUMPECTOMY WITH RADIOACTIVE SEED AND SENTINEL LYMPH NODE BIOPSY Left 01/22/2020   Procedure: LEFT BREAST LUMPECTOMY WITH RADIOACTIVE SEED;  Surgeon: Rolm Bookbinder, MD;  Location: [facility];  Service: General;  Laterality: Left;   BREAST SURGERY  2012   Rt.mastectomy--[place], [place] CATHETERIZATION  2013   CATARACT EXTRACTION, BILATERAL     CERVICAL CONIZATION W/BX N/A 08/08/2020   Procedure: VAGINAL SIDEWALL BIOPSY;  Surgeon: Megan Salon, MD;  Location: [facility];  Service: Gynecology;  Laterality: N/A;   DILATION AND CURETTAGE OF UTERUS     in her early 48's for DUB   ESOPHAGOGASTRODUODENOSCOPY Left 01/08/2016   Procedure: ESOPHAGOGASTRODUODENOSCOPY (EGD);  Surgeon: Arta Silence, MD;  Location: [facility];  Service: Endoscopy;  Laterality: Left;   GALLBLADDER SURGERY     MASTECTOMY Right 2012   RADIOLOGY WITH ANESTHESIA N/A 01/25/2016   Procedure: RADIOLOGY WITH ANESTHESIA;  Surgeon: Luanne Bras, MD;  Location: [facility];  Service: Radiology;  Laterality: N/A;   REPLACEMENT TOTAL KNEE BILATERAL     TONSILLECTOMY AND ADENOIDECTOMY  as a child    Family History  Problem Relation Age of Onset   Asthma Mother    CVA Father    Atrial fibrillation Sister        HAS PACER   Pneumonia Brother    Cancer Daughter        COLON CANCER   Cancer Maternal Grandmother        breast cancer    Social History   Socioeconomic History   Marital status: Married    Spouse name: Not on file   Number of children: Not on file   Years of education: Not on file   Highest education level: Not on  file  Occupational History   Not on file  Tobacco Use   Smoking status: Former    Packs/day: 0.25    Years: 2.00    Total pack years: 0.50    Types: Cigarettes    Quit date: 28    Years since quitting: 73.7   Smokeless tobacco: Never  Vaping Use   Vaping Use: Never used  Substance and Sexual Activity   Alcohol use: No    Alcohol/week: 0.0 standard drinks of alcohol   Drug use: No   Sexual activity: Not Currently    Partners: Male    Birth control/protection: Post-menopausal  Other Topics Concern   Not on file  Social History Narrative   Not on file   Social Determinants of Health   Financial Resource Strain: Not on file  Food Insecurity: Not on file  Transportation Needs: Not on file  Physical Activity: Not on file  Stress: Not on file  Social Connections: Not on file  Intimate Partner Violence: Not on file    Allergies  Allergen Reactions   Valium [Diazepam] Other (See Comments)    HYPERACTIVITY    Amiodarone Hcl [Amiodarone] Other (See Comments)    AFIB   Compazine [Prochlorperazine] Other (See Comments)    HYPERACTIVITY    Other Other (See Comments)    "Kidney dye"--patient states this gave her severe shakes/chills   Thorazine [Chlorpromazine] Other (See Comments)    HYPERACTIVITY    Zometa [Zoledronic Acid] Other (See Comments)    PROBLEMS WITH EYES  TIA   Ciprofloxacin Swelling    "swelling" in Rt.elbow   Doxycycline Nausea Only    Current Outpatient Medications  Medication Sig Dispense Refill   acetaminophen (TYLENOL) 500 MG tablet Take 500-1,000 mg by mouth every 12 (twelve) hours as needed (pain).      albuterol (VENTOLIN HFA) 108 (90 Base) MCG/ACT inhaler Inhale 1 puff into the lungs every 6 (six) hours as needed for wheezing or shortness of breath (Cough). 18 g 2   Ascorbic Acid (VITAMIN C WITH ROSE HIPS) 500 MG tablet Take 500 mg by mouth in the morning and at bedtime.     atorvastatin (LIPITOR) 10 MG tablet TAKE 1 TABLET DAILY 90 tablet 3    diltiazem (CARDIZEM) 60 MG tablet TAKE 1 TABLET THREE TIMES A DAY 270 tablet 3   ELIQUIS 5 MG TABS tablet TAKE 1 TABLET TWICE A DAY 180 tablet 3   furosemide (LASIX) 40 MG tablet Take 1 tablet (40 mg total) by mouth daily. 90 tablet 3   metoprolol tartrate (LOPRESSOR) 100 MG tablet TAKE 1 TABLET TWICE A DAY 180 tablet 3   Multiple Vitamin (MULTIVITAMIN WITH MINERALS) TABS tablet Take 1 tablet by mouth daily. Centrum for Women 50+     omeprazole (PRILOSEC) 10 MG capsule Take 10 mg by  mouth every other day.     Potassium Chloride ER 20 MEQ TBCR Take 1 tablet by mouth daily.     Probiotic Product (UP4 PROBIOTICS WOMENS PO) Take 1 capsule by mouth daily.     No current facility-administered medications for this visit.    PHYSICAL EXAM Vitals:   05/31/22 1051  BP: (!) 147/61  Pulse: 61  Resp: 20  Temp: 98.2 F (36.8 C)  SpO2: 98%  Weight: 135 lb (61.2 kg)  Height: 5' (1.524 m)    Well-appearing elderly woman in no acute distress Regular rate and rhythm Unlabored breathing Chronically edematous left lower extremity with 1-2+ nonpitting edema from the ankle to the knee.  Prominent reticular veins about the ankles bilaterally.   PERTINENT LABORATORY AND RADIOLOGIC DATA  Most recent CBC    Latest Ref Rng & Units 12/28/2021    1:25 PM 08/19/2021    3:08 AM 08/18/2021    5:00 AM  CBC  WBC 4.0 - 10.5 K/uL 8.0  10.0  8.5   Hemoglobin 12.0 - 15.0 g/dL 12.7  10.2  9.9   Hematocrit 36.0 - 46.0 % 38.6  30.9  30.4   Platelets 150 - 400 K/uL 228  282  286      Most recent CMP    Latest Ref Rng & Units 12/28/2021    1:25 PM 09/03/2021    3:01 PM 08/25/2021   11:57 AM  CMP  Glucose 70 - 99 mg/dL 129  111  97   BUN 8 - 23 mg/dL '22  22  23   '$ Creatinine 0.44 - 1.00 mg/dL 1.10  1.18  1.36   Sodium 135 - 145 mmol/L 139  138  137   Potassium 3.5 - 5.1 mmol/L 4.4  4.4  4.6   Chloride 98 - 111 mmol/L 103  99  95   CO2 22 - 32 mmol/L '29  26  27   '$ Calcium 8.9 - 10.3 mg/dL 9.9  9.9  9.6    Total Protein 6.5 - 8.1 g/dL 7.6     Total Bilirubin 0.3 - 1.2 mg/dL 0.6     Alkaline Phos 38 - 126 U/L 74     AST 15 - 41 U/L 16     ALT 0 - 44 U/L 11       Renal function CrCl cannot be calculated (Patient's most recent lab result is older than the maximum 21 days allowed.).  Hgb A1c MFr Bld (%)  Date Value  01/26/2016 5.6    LDL Calculated  Date Value Ref Range Status  05/06/2019 58 0 - 99 mg/dL Final    Comment:    **Effective May 13, 2019, [facility] is implementing an improved** equation to calculate Low Density Lipoprotein Cholesterol (LDL-C) concentrations, to be used in all lipid panels that report calculated LDL-C. This equation was developed through a collaboration with the [employer], Lung and [place] (NIH).[1] The NIH calculation overcomes the limitations of the existing [place] LDL-C equation and performs equally well in both fasting and non-fasting individuals. 1. Pauline Good Q, et al. A new equation for calculation of low-density lipoprotein cholesterol in patients with normolipidemia and/or hypertriglyceridemia. JAMA Cardiol. 2020 Feb 26. doi:10.1001/jamacardio.2020.0013     - No evidence of deep vein thrombosis from the common femoral through the  popliteal veins.  - No evidence of superficial venous thrombosis.  - The common femoral vein is not competent.  - The great and small saphenous  veins are competent.     Yevonne Aline. Stanford Breed, MD [facility] Phone Number: [phone] 05/30/2022 8:06 PM  Total time spent on preparing this encounter including chart review, data review, collecting history, examining the patient, coordinating care for this new patient, 45 minutes.  Portions of this report may have been transcribed using voice recognition software.  Every effort has been made to ensure accuracy; however, inadvertent computerized transcription errors may still be  present.

## 2022-05-31 ENCOUNTER — Ambulatory Visit (INDEPENDENT_AMBULATORY_CARE_PROVIDER_SITE_OTHER): Payer: Medicare Other | Admitting: Vascular Surgery

## 2022-05-31 ENCOUNTER — Ambulatory Visit (HOSPITAL_COMMUNITY)
Admission: RE | Admit: 2022-05-31 | Discharge: 2022-05-31 | Disposition: A | Discharge status: Home / Self Care | Payer: Medicare Other | Source: Ambulatory Visit | Attending: Vascular Surgery | Admitting: Vascular Surgery

## 2022-05-31 ENCOUNTER — Encounter: Payer: Self-pay | Admitting: Vascular Surgery

## 2022-05-31 ENCOUNTER — Other Ambulatory Visit: Payer: Self-pay | Admitting: *Deleted

## 2022-05-31 VITALS — BP 147/61 | HR 61 | Temp 98.2°F | Resp 20 | Ht 60.0 in | Wt 135.0 lb

## 2022-05-31 DIAGNOSIS — I83899 Varicose veins of unspecified lower extremities with other complications: Secondary | ICD-10-CM

## 2022-05-31 DIAGNOSIS — I872 Venous insufficiency (chronic) (peripheral): Secondary | ICD-10-CM | POA: Diagnosis not present

## 2022-06-04 ENCOUNTER — Other Ambulatory Visit: Payer: Self-pay

## 2022-06-04 ENCOUNTER — Emergency Department (HOSPITAL_BASED_OUTPATIENT_CLINIC_OR_DEPARTMENT_OTHER)
Admission: EM | Admit: 2022-06-04 | Discharge: 2022-06-04 | Disposition: A | Discharge status: Home / Self Care | Payer: Medicare Other | Attending: Emergency Medicine | Admitting: Emergency Medicine

## 2022-06-04 DIAGNOSIS — Z79899 Other long term (current) drug therapy: Secondary | ICD-10-CM | POA: Diagnosis not present

## 2022-06-04 DIAGNOSIS — T83098A Other mechanical complication of other indwelling urethral catheter, initial encounter: Secondary | ICD-10-CM | POA: Diagnosis not present

## 2022-06-04 DIAGNOSIS — Y69 Unspecified misadventure during surgical and medical care: Secondary | ICD-10-CM | POA: Insufficient documentation

## 2022-06-04 DIAGNOSIS — T839XXA Unspecified complication of genitourinary prosthetic device, implant and graft, initial encounter: Secondary | ICD-10-CM

## 2022-06-04 DIAGNOSIS — T83091A Other mechanical complication of indwelling urethral catheter, initial encounter: Secondary | ICD-10-CM | POA: Diagnosis not present

## 2022-06-04 NOTE — ED Provider Notes (Signed)
[facility] EMERGENCY DEPT Provider Note   CSN: [id number] Arrival date & time: 06/04/22  2037     History  Chief Complaint  Patient presents with   Foley Problem    Adrienne [name] is a 88 y.o. female.  HPI Patient reports she has a chronic indwelling Foley catheter but it fell out earlier today.  She is not sure why it came out.  Reports however once the catheter came out she cannot urinate and she started to get a lot of suprapubic pain and pressure.    Home Medications Prior to Admission medications   Medication Sig Start Date End Date Taking? Authorizing Provider  acetaminophen (TYLENOL) 500 MG tablet Take 500-1,000 mg by mouth every 12 (twelve) hours as needed (pain).     [provider]  albuterol (VENTOLIN HFA) 108 (90 Base) MCG/ACT inhaler Inhale 1 puff into the lungs every 6 (six) hours as needed for wheezing or shortness of breath (Cough). 05/13/20   Martyn Ehrich, NP  Ascorbic Acid (VITAMIN C WITH ROSE HIPS) 500 MG tablet Take 500 mg by mouth in the morning and at bedtime.    [provider]  atorvastatin (LIPITOR) 10 MG tablet TAKE 1 TABLET DAILY 09/02/21   Jerline Pain, MD  diltiazem (CARDIZEM) 60 MG tablet TAKE 1 TABLET THREE TIMES A DAY 09/02/21   Jerline Pain, MD  ELIQUIS 5 MG TABS tablet TAKE 1 TABLET TWICE A DAY 06/04/21   Jerline Pain, MD  furosemide (LASIX) 40 MG tablet Take 1 tablet (40 mg total) by mouth daily. 09/15/21   Jerline Pain, MD  metoprolol tartrate (LOPRESSOR) 100 MG tablet TAKE 1 TABLET TWICE A DAY 09/02/21   Jerline Pain, MD  Multiple Vitamin (MULTIVITAMIN WITH MINERALS) TABS tablet Take 1 tablet by mouth daily. Centrum for Women 50+    [provider]  omeprazole (PRILOSEC) 10 MG capsule Take 10 mg by mouth every other day. 03/21/19   [provider]  Potassium Chloride ER 20 MEQ TBCR Take 1 tablet by mouth daily. 01/16/22   [provider]  Probiotic Product (UP4  PROBIOTICS WOMENS PO) Take 1 capsule by mouth daily.    [provider]      Allergies    Valium [diazepam], Amiodarone hcl [amiodarone], Compazine [prochlorperazine], Other, Thorazine [chlorpromazine], Zometa [zoledronic acid], Ciprofloxacin, and Doxycycline    Review of Systems   Review of Systems  Physical Exam Updated Vital Signs BP (!) 169/57   Pulse (!) 58   Temp 98.1 F (36.7 C) (Oral)   Resp 18   Ht 5' (1.524 m)   Wt 59.9 kg   LMP 09/12/1973 (Approximate)   SpO2 97%   BMI 25.78 kg/m  Physical Exam Constitutional:      Comments: Patient is alert nontoxic clinically well in appearance.  Cardiovascular:     Rate and Rhythm: Normal rate and regular rhythm.  Pulmonary:     Effort: Pulmonary effort is normal.     Breath sounds: Normal breath sounds.  Abdominal:     General: There is no distension.     Palpations: Abdomen is soft.     Tenderness: There is no abdominal tenderness. There is no guarding.  Genitourinary:    Comments: Catheter in place.  Pale yellow urine in the bag. Musculoskeletal:        General: No swelling or tenderness.  Skin:    General: Skin is warm and dry.  Neurological:  General: No focal deficit present.     ED Results / Procedures / Treatments   Labs (all labs ordered are listed, but only abnormal results are displayed) Labs Reviewed - No data to display  EKG None  Radiology No results found.  Procedures Procedures    Medications Ordered in ED Medications - No data to display  ED Course/ Medical Decision Making/ A&P                           Medical Decision Making  Patient presents after her chronic indwelling Foley catheter fell out.  She started to get urinary retention and pain.  Patient presents the emergency department for replacement of catheter.  Catheter replaced by nursing staff.  Patient is having passage of clear yellow urine.  She does not have other complaints of recent fever or flank pain.  No  suspicion at this time for UTI or other complications.  Stable for discharge with replacement of indwelling chronic Foley catheter.        Final Clinical Impression(s) / ED Diagnoses Final diagnoses:  Problem with Foley catheter, initial encounter [facility])    Rx / DC Orders ED Discharge Orders     None         Charlesetta Shanks, MD 06/04/22 2335

## 2022-06-04 NOTE — Discharge Instructions (Addendum)
1.  Continue to follow-up with your doctor as needed for management of your Foley catheter.

## 2022-06-04 NOTE — ED Triage Notes (Addendum)
Patient arrives with complaints of foley problem. Patient states that her chronic foley came out of her vagina earlier today. Patient states that she reached out to her Urologist who told her to come here to get it replaced.   Patient states that she has pelvic issues that causes her bladder to be blocked, which is why she has a catheter.   Rates pain a 5/10.

## 2022-06-06 DIAGNOSIS — M810 Age-related osteoporosis without current pathological fracture: Secondary | ICD-10-CM | POA: Diagnosis not present

## 2022-06-06 DIAGNOSIS — E7800 Pure hypercholesterolemia, unspecified: Secondary | ICD-10-CM | POA: Diagnosis not present

## 2022-06-06 DIAGNOSIS — K219 Gastro-esophageal reflux disease without esophagitis: Secondary | ICD-10-CM | POA: Diagnosis not present

## 2022-06-06 DIAGNOSIS — N1832 Chronic kidney disease, stage 3b: Secondary | ICD-10-CM | POA: Diagnosis not present

## 2022-06-21 DIAGNOSIS — D2272 Melanocytic nevi of left lower limb, including hip: Secondary | ICD-10-CM | POA: Diagnosis not present

## 2022-06-21 DIAGNOSIS — D2271 Melanocytic nevi of right lower limb, including hip: Secondary | ICD-10-CM | POA: Diagnosis not present

## 2022-06-21 DIAGNOSIS — L821 Other seborrheic keratosis: Secondary | ICD-10-CM | POA: Diagnosis not present

## 2022-06-21 DIAGNOSIS — L814 Other melanin hyperpigmentation: Secondary | ICD-10-CM | POA: Diagnosis not present

## 2022-06-21 DIAGNOSIS — D2261 Melanocytic nevi of right upper limb, including shoulder: Secondary | ICD-10-CM | POA: Diagnosis not present

## 2022-06-21 DIAGNOSIS — D1801 Hemangioma of skin and subcutaneous tissue: Secondary | ICD-10-CM | POA: Diagnosis not present

## 2022-06-21 DIAGNOSIS — D225 Melanocytic nevi of trunk: Secondary | ICD-10-CM | POA: Diagnosis not present

## 2022-06-28 ENCOUNTER — Encounter: Payer: Self-pay | Admitting: Hematology and Oncology

## 2022-06-28 ENCOUNTER — Inpatient Hospital Stay: Payer: Medicare Other

## 2022-06-28 ENCOUNTER — Other Ambulatory Visit: Payer: Self-pay

## 2022-06-28 ENCOUNTER — Inpatient Hospital Stay: Payer: Medicare Other | Attending: Hematology and Oncology | Admitting: Hematology and Oncology

## 2022-06-28 VITALS — BP 172/61 | HR 67 | Temp 98.8°F | Resp 18 | Ht 60.0 in | Wt 135.2 lb

## 2022-06-28 DIAGNOSIS — C50212 Malignant neoplasm of upper-inner quadrant of left female breast: Secondary | ICD-10-CM | POA: Diagnosis not present

## 2022-06-28 DIAGNOSIS — Z171 Estrogen receptor negative status [ER-]: Secondary | ICD-10-CM | POA: Insufficient documentation

## 2022-06-28 DIAGNOSIS — Z170 Estrogen receptor positive status [ER+]: Secondary | ICD-10-CM

## 2022-06-28 DIAGNOSIS — Z853 Personal history of malignant neoplasm of breast: Secondary | ICD-10-CM | POA: Insufficient documentation

## 2022-06-28 DIAGNOSIS — Z79899 Other long term (current) drug therapy: Secondary | ICD-10-CM | POA: Insufficient documentation

## 2022-06-28 DIAGNOSIS — C50811 Malignant neoplasm of overlapping sites of right female breast: Secondary | ICD-10-CM

## 2022-06-28 DIAGNOSIS — Z923 Personal history of irradiation: Secondary | ICD-10-CM | POA: Insufficient documentation

## 2022-06-28 DIAGNOSIS — Z87891 Personal history of nicotine dependence: Secondary | ICD-10-CM | POA: Diagnosis not present

## 2022-06-28 DIAGNOSIS — R338 Other retention of urine: Secondary | ICD-10-CM | POA: Diagnosis not present

## 2022-06-28 LAB — CMP (CANCER CENTER ONLY)
ALT: 10 U/L (ref 0–44)
AST: 15 U/L (ref 15–41)
Albumin: 4.1 g/dL (ref 3.5–5.0)
Alkaline Phosphatase: 86 U/L (ref 38–126)
Anion gap: 4 — ABNORMAL LOW (ref 5–15)
BUN: 22 mg/dL (ref 8–23)
CO2: 34 mmol/L — ABNORMAL HIGH (ref 22–32)
Calcium: 9.8 mg/dL (ref 8.9–10.3)
Chloride: 101 mmol/L (ref 98–111)
Creatinine: 1.14 mg/dL — ABNORMAL HIGH (ref 0.44–1.00)
GFR, Estimated: 45 mL/min — ABNORMAL LOW (ref >60)
Glucose, Bld: 116 mg/dL — ABNORMAL HIGH (ref 70–99)
Potassium: 4.5 mmol/L (ref 3.5–5.1)
Sodium: 139 mmol/L (ref 135–145)
Total Bilirubin: 0.5 mg/dL (ref 0.3–1.2)
Total Protein: 7.2 g/dL (ref 6.5–8.1)

## 2022-06-28 LAB — CBC WITH DIFFERENTIAL (CANCER CENTER ONLY)
Abs Immature Granulocytes: 0.02 10*3/uL (ref 0.00–0.07)
Basophils Absolute: 0 10*3/uL (ref 0.0–0.1)
Basophils Relative: 0 %
Eosinophils Absolute: 0.2 10*3/uL (ref 0.0–0.5)
Eosinophils Relative: 2 %
HCT: 37.6 % (ref 36.0–46.0)
Hemoglobin: 12.5 g/dL (ref 12.0–15.0)
Immature Granulocytes: 0 %
Lymphocytes Relative: 18 %
Lymphs Abs: 1.7 10*3/uL (ref 0.7–4.0)
MCH: 31.2 pg (ref 26.0–34.0)
MCHC: 33.2 g/dL (ref 30.0–36.0)
MCV: 93.8 fL (ref 80.0–100.0)
Monocytes Absolute: 0.9 10*3/uL (ref 0.1–1.0)
Monocytes Relative: 10 %
Neutro Abs: 6.2 10*3/uL (ref 1.7–7.7)
Neutrophils Relative %: 70 %
Platelet Count: 247 10*3/uL (ref 150–400)
RBC: 4.01 MIL/uL (ref 3.87–5.11)
RDW: 13.2 % (ref 11.5–15.5)
WBC Count: 9.1 10*3/uL (ref 4.0–10.5)
nRBC: 0 % (ref 0.0–0.2)

## 2022-06-28 NOTE — Progress Notes (Signed)
Adrienne [name]  [phone]) [phone] [phone]) [phone]     ID: Adrienne [name] DOB: [birth year]  MR#: [id number]  KYH#:[id number]  Patient Care Team: Adrienne Lass, MD as PCP - General (Family Medicine) Adrienne Pain, MD as PCP - Cardiology (Cardiology) [place] [name], Adrienne All, MD as Consulting Physician (Obstetrics and Gynecology) Magrinat, Virgie Dad, MD (Inactive) as Consulting Physician (Oncology) Adrienne Pain, MD as Consulting Physician (Cardiology) Adrienne Levans, MD as Referring Physician (Dermatology) Adrienne Nash, DO as Consulting Physician (Pulmonary Disease) Adrienne Bookbinder, MD as Consulting Physician (General Surgery) Adrienne Pray, MD as Consulting Physician (Radiation Oncology) Adrienne Pike, MD   CHIEF COMPLAINT: Bilateral breast cancer,  estrogen receptor positive (s/p right mastectomy)  CURRENT TREATMENT: Observation  INTERVAL HISTORY:  Adrienne [name] returns today for follow up of her bilateral breast cancers. She continues under observation.  She is here by herself. Since last visit, she has 2 great-grandchildren coming up in fall in April.  She is very excited by this.  She however tells me that the liver in the breast can [place] so she may not get to visit them but she is excited about her family growing.  She denies any changes in her breast.  She is chronically inverted nipple on the left side.   Rest of the pertinent 10 point ROS reviewed and negative.  COVID 19 VACCINATION STATUS: Status post Pfizer x2+ booster October 2021  RIGHT BREAST CANCER HISTORY: From the original intake note:  Adrienne [name] has a right breast mass noted by her primary care doctor in [place] in 2012.  She was referred to surgery and underwent right mastectomy and axillary lymph node dissection or sampling (we do not have the pathology report) 06/04/2011 for what appears to have been a multicentric invasive ductal carcinoma, grade 2.  There  were three separate tumor foci, spanning a total of 3.8 cm. Prognostic indicators were: ER positive, PR positive, and Her2 negative. One out of 3 biopsied lymph nodes was positive for micrometastasis.   LEFT BREAST CANCER HISTORY: Adrienne [name] had routine left screening mammography on 11/19/2019 showing a possible abnormality. She underwent left diagnostic mammography with tomography and left breast ultrasonography at [facility] on 12/05/2019 showing: breast density category C; 0.7 cm left breast mass at 11 o'clock; development of three left breast cysts compared to prior imaging from [name] in 11/2017; no left axillary adenopathy.  Accordingly on 12/10/2019 she proceeded to biopsy of the left breast area in question. The pathology from this procedure ([id number]) showed: invasive ductal carcinoma, grade 3; ductal carcinoma in situ. Prognostic indicators significant for: estrogen receptor, 0% negative and progesterone receptor, 0% negative. Proliferation marker Ki67 at 90%. HER2 negative by immunohistochemistry (0).  The patient's subsequent history is as detailed below.   PAST MEDICAL HISTORY: Past Medical History:  Diagnosis Date   Afib ([place])    Arthritis of ankle    CAD (coronary artery disease)    Cancer ([place]) 2012   breast cancer-right   Compression fracture of T12 vertebra (HCC)    Diverticulosis    Edema    Elevated uric acid in blood    GERD (gastroesophageal reflux disease)    GI bleed 12/2015   Hyperlipidemia    Hypertension    Long-term (current) use of anticoagulants    Moderate mitral regurgitation    Personal history of radiation therapy    Pulmonary HTN ([place])    Echo 1/19: EF 60-65, no RWMA, Ao sclerosis, trivial AI, mild MR,  mod BAE, mod TR, PASP 49   Stroke ([place]) 01/25/2016   TIA (transient ischemic attack) 09/2011   Urinary incontinence    Urinary retention     PAST SURGICAL HISTORY: Past Surgical History:  Procedure Laterality Date   APPENDECTOMY     AXILLARY  SENTINEL NODE BIOPSY Left 01/22/2020   Procedure: Left Axillary Sentinel Node Biopsy;  Surgeon: Adrienne Bookbinder, MD;  Location: [facility];  Service: General;  Laterality: Left;   BREAST LUMPECTOMY WITH RADIOACTIVE SEED AND SENTINEL LYMPH NODE BIOPSY Left 01/22/2020   Procedure: LEFT BREAST LUMPECTOMY WITH RADIOACTIVE SEED;  Surgeon: Adrienne Bookbinder, MD;  Location: [facility];  Service: General;  Laterality: Left;   BREAST SURGERY  2012   Rt.mastectomy--[place], [place] CATHETERIZATION  2013   CATARACT EXTRACTION, BILATERAL     CERVICAL CONIZATION W/BX N/A 08/08/2020   Procedure: VAGINAL SIDEWALL BIOPSY;  Surgeon: Megan Salon, MD;  Location: [facility];  Service: Gynecology;  Laterality: N/A;   DILATION AND CURETTAGE OF UTERUS     in her early 38's for DUB   ESOPHAGOGASTRODUODENOSCOPY Left 01/08/2016   Procedure: ESOPHAGOGASTRODUODENOSCOPY (EGD);  Surgeon: Arta Silence, MD;  Location: [facility];  Service: Endoscopy;  Laterality: Left;   GALLBLADDER SURGERY     MASTECTOMY Right 2012   RADIOLOGY WITH ANESTHESIA N/A 01/25/2016   Procedure: RADIOLOGY WITH ANESTHESIA;  Surgeon: Luanne Bras, MD;  Location: [facility];  Service: Radiology;  Laterality: N/A;   REPLACEMENT TOTAL KNEE BILATERAL     TONSILLECTOMY AND ADENOIDECTOMY     as a child    FAMILY HISTORY: Family History  Problem Relation Age of Onset   Asthma Mother    CVA Father    Atrial fibrillation Sister        HAS PACER   Pneumonia Brother    Cancer Daughter        COLON CANCER   Cancer Maternal Grandmother        breast cancer  Patient's father was 73 years old when he died from stroke. Patient's mother died from coronary artery disease at age 4. The patient denies a family hx of ovarian cancer. She reports breast cancer in her maternal grandmother. She has 2 siblings, 1 brother and 1 sister. Her brother died from stroke/AIDS. She also reports pancreatic cancer in her paternal grandmother and colon cancer in her  daughter.   GYNECOLOGIC HISTORY:  Patient's last menstrual period was 09/12/1973 (approximate). Menarche: 88 years old Age at first live birth: 88 years old [place] P 3 LMP late 4s Contraceptive never HRT yes, remote , less than 6 months Hysterectomy?  No BSO?  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  Her husband [name] is a Marketing executive, (PhD, worked at [employer]).  Their children are Lockie Pares who lives in [place] and whose husband is a English as a second language teacher; Blair [employer]") who lives in [place] and works in finances; and Vienna, who lives in [place] and works for [employer].  The patient has 11 grandchildren and 8 great-grandchildren.  She attends [employer] locally    ADVANCED DIRECTIVES: In the absence of documents to the contrary the patient's husband is her healthcare power of attorney   HEALTH MAINTENANCE: Social History   Tobacco Use   Smoking status: Former    Packs/day: 0.25    Years: 2.00    Total pack years: 0.50  Types: Cigarettes    Quit date: 68    Years since quitting: 73.8   Smokeless tobacco: Never  Vaping Use   Vaping Use: Never used  Substance Use Topics   Alcohol use: No    Alcohol/week: 0.0 standard drinks of alcohol   Drug use: No     Colonoscopy: Remote  PAP: 02/2019, negative  Bone density: Osteopenia   Allergies  Allergen Reactions   Valium [Diazepam] Other (See Comments)    HYPERACTIVITY    Amiodarone Hcl [Amiodarone] Other (See Comments)    AFIB   Compazine [Prochlorperazine] Other (See Comments)    HYPERACTIVITY    Other Other (See Comments)    "Kidney dye"--patient states this gave her severe shakes/chills   Thorazine [Chlorpromazine] Other (See Comments)    HYPERACTIVITY    Zometa [Zoledronic Acid] Other (See Comments)    PROBLEMS WITH EYES  TIA   Ciprofloxacin Swelling    "swelling" in Rt.elbow   Doxycycline  Nausea Only    Current Outpatient Medications  Medication Sig Dispense Refill   acetaminophen (TYLENOL) 500 MG tablet Take 500-1,000 mg by mouth every 12 (twelve) hours as needed ([name]).      albuterol (VENTOLIN HFA) 108 (90 Base) MCG/ACT inhaler Inhale 1 puff into the lungs every 6 (six) hours as needed for wheezing or shortness of breath (Cough). 18 g 2   Ascorbic Acid (VITAMIN C WITH ROSE HIPS) 500 MG tablet Take 500 mg by mouth in the morning and at bedtime.     atorvastatin (LIPITOR) 10 MG tablet TAKE 1 TABLET DAILY 90 tablet 3   diltiazem (CARDIZEM) 60 MG tablet TAKE 1 TABLET THREE TIMES A DAY 270 tablet 3   ELIQUIS 5 MG TABS tablet TAKE 1 TABLET TWICE A DAY 180 tablet 3   furosemide (LASIX) 40 MG tablet Take 1 tablet (40 mg total) by mouth daily. 90 tablet 3   metoprolol tartrate (LOPRESSOR) 100 MG tablet TAKE 1 TABLET TWICE A DAY 180 tablet 3   Multiple Vitamin (MULTIVITAMIN WITH MINERALS) TABS tablet Take 1 tablet by mouth daily. Centrum for Women 50+     omeprazole (PRILOSEC) 10 MG capsule Take 10 mg by mouth every other day.     Potassium Chloride ER 20 MEQ TBCR Take 1 tablet by mouth daily.     Probiotic Product (UP4 PROBIOTICS WOMENS PO) Take 1 capsule by mouth daily.     No current facility-administered medications for this visit.    OBJECTIVE: White woman who appears stated age  88:   06/28/22 1432  BP: (!) 172/61  Pulse: 67  Resp: 18  Temp: 98.8 F (37.1 C)  SpO2: 96%     Body mass index is 26.4 kg/m.   Wt Readings from Last 3 Encounters:  06/28/22 135 lb 3.2 oz (61.3 kg)  06/04/22 132 lb (59.9 kg)  05/31/22 135 lb (61.2 kg)     ECOG FS: 1 Physical Exam Constitutional:      General: She is not in acute distress.    Appearance: Normal appearance.  Chest:     Comments: Right breast status postmastectomy.  Left breast normal to inspection except for inverted nipple.  There appears to be postoperative changes in the left breast upper outer quadrant with  postop seroma.  No other masses or regional adenopathy. Exam limited since patient is sitting in the chair leaning backwards. Musculoskeletal:     Cervical back: Normal range of motion and neck supple. No rigidity.  Skin:  General: Skin is warm and dry.  Neurological:     General: No focal deficit present.     Mental Status: She is alert.       LAB RESULTS:  CMP     Component Value Date/Time   NA 139 12/28/2021 1325   NA 138 09/03/2021 1501   K 4.4 12/28/2021 1325   CL 103 12/28/2021 1325   CO2 29 12/28/2021 1325   GLUCOSE 129 (H) 12/28/2021 1325   BUN 22 12/28/2021 1325   BUN 22 09/03/2021 1501   CREATININE 1.10 (H) 12/28/2021 1325   CALCIUM 9.9 12/28/2021 1325   PROT 7.6 12/28/2021 1325   PROT 6.6 05/06/2019 1608   ALBUMIN 4.1 12/28/2021 1325   ALBUMIN 3.9 05/06/2019 1608   AST 16 12/28/2021 1325   ALT 11 12/28/2021 1325   ALKPHOS 74 12/28/2021 1325   BILITOT 0.6 12/28/2021 1325   GFRNONAA 47 (L) 12/28/2021 1325   GFRAA 43 (L) 11/04/2020 1647    No results found for: "TOTALPROTELP", "ALBUMINELP", "A1GS", "A2GS", "BETS", "BETA2SER", "GAMS", "MSPIKE", "SPEI"  No results found for: "KPAFRELGTCHN", "LAMBDASER", "KAPLAMBRATIO"  Lab Results  Component Value Date   WBC 9.1 06/28/2022   NEUTROABS 6.2 06/28/2022   HGB 12.5 06/28/2022   HCT 37.6 06/28/2022   MCV 93.8 06/28/2022   PLT 247 06/28/2022    No results found for: "LABCA2"  No components found for: "NVBTYO060"  No results for input(s): "INR" in the last 168 hours.  No results found for: "LABCA2"  No results found for: "CAN199"  No results found for: "CAN125"  No results found for: "CAN153"  No results found for: "CA2729"  No components found for: "HGQUANT"  No results found for: "CEA1", "CEA" / No results found for: "CEA1", "CEA"   No results found for: "AFPTUMOR"  No results found for: "CHROMOGRNA"  No results found for: "HGBA", "HGBA2QUANT", "HGBFQUANT", "HGBSQUAN" (Hemoglobinopathy  evaluation)   No results found for: "LDH"  Lab Results  Component Value Date   IRON 66 08/25/2020   (Iron and TIBC)  Lab Results  Component Value Date   FERRITIN 101 08/25/2020    Urinalysis    Component Value Date/Time   COLORURINE YELLOW 08/29/2017 2343   APPEARANCEUR CLOUDY (A) 08/29/2017 2343   LABSPEC 1.005 08/29/2017 2343   PHURINE 6.0 08/29/2017 2343   GLUCOSEU NEGATIVE 08/29/2017 2343   HGBUR SMALL (A) 08/29/2017 2343   BILIRUBINUR n 04/14/2020 [place] 08/29/2017 2343   PROTEINUR Negative 04/14/2020 1705   PROTEINUR NEGATIVE 08/29/2017 2343   UROBILINOGEN 0.2 04/14/2020 1705   NITRITE n 04/14/2020 1705   NITRITE NEGATIVE 08/29/2017 2343   LEUKOCYTESUR Negative 04/14/2020 1705    STUDIES: VAS [place] LOWER EXTREMITY VENOUS REFLUX  Result Date: 05/31/2022  Lower Venous Reflux Study Patient Name:  DEJIA EBRON  Date of Exam:   05/31/2022 Medical Rec #: [id number]                 Accession #:    [id number] Date of Birth: July 10, 1928                 Patient Gender: F Patient Age:   74 years Exam Location:  [facility] Procedure:      VAS [place] LOWER EXTREMITY VENOUS REFLUX Referring Phys: Jamelle Haring --------------------------------------------------------------------------------  Indications: Bleeding episode from varicose vein at the left ankle.  Performing Technologist: Ronal Fear RVS, RCS  Examination Guidelines: A complete evaluation includes B-mode imaging, spectral Doppler,  color Doppler, and power Doppler as needed of [name] accessible portions of each vessel. Bilateral testing is considered an integral part of a complete examination. Limited examinations for reoccurring indications may be performed as noted. The reflux portion of the exam is performed with the patient in reverse Trendelenburg. Significant venous reflux is defined as >500 ms in the superficial venous system, and >1 second in the deep venous system.   +------------------+---------+------+-----------+------------+--------+ LEFT              Reflux NoRefluxReflux TimeDiameter cmsComments                             Yes                                  +------------------+---------+------+-----------+------------+--------+ CFV                         yes   >1 second                      +------------------+---------+------+-----------+------------+--------+ FV mid            no                                             +------------------+---------+------+-----------+------------+--------+ Popliteal         no                                             +------------------+---------+------+-----------+------------+--------+ GSV at [facility]        no                            0.79             +------------------+---------+------+-----------+------------+--------+ GSV prox thigh    no                            0.28             +------------------+---------+------+-----------+------------+--------+ GSV mid thigh     no                            0.28             +------------------+---------+------+-----------+------------+--------+ GSV dist thigh    no                            0.22             +------------------+---------+------+-----------+------------+--------+ SSV Pop Fossa     no                            0.19             +------------------+---------+------+-----------+------------+--------+ anterior accessoryno                            0.27             +------------------+---------+------+-----------+------------+--------+  Summary: Left: - No evidence of deep vein thrombosis from the common femoral through the popliteal veins. - No evidence of superficial venous thrombosis. - The common femoral vein is not competent. - The great and small saphenous veins are competent.  *See table(s) above for measurements and observations. Electronically signed by Monica Martinez MD on  05/31/2022 at 2:06:10 PM.    Final       ELIGIBLE FOR AVAILABLE RESEARCH PROTOCOL: No  ASSESSMENT: 88 y.o.  woman   RIGHT BREAST CANCER (1) status post right mastectomy and sentinel lymph node sampling September 2012 for what appears to have been a multifocal T1-T2 N1 (mic) invasive ductal carcinoma, estrogen and progesterone receptor positive, HER-2 not amplified  (2) completed 5 years of anastrozole 2017  LEFT BREAST CANCER (3) status post left breast upper inner quadrant biopsy 12/10/2019 for a clinical T1b N0, stage IB invasive ductal carcinoma grade 3, triple negative, with an MIB-1 of 90%  (4) status post left lumpectomy and sentinel lymph node sampling 01/22/2020 for a pT1a pN0, stage IA invasive ductal carcinoma, with negative margins  (a) ductal carcinoma in situ with necrosis also removed, [name] margins negative  (b) a total of 2 lymph nodes were removed  (5) adjuvant chemotherapy discussed, opted against  (6) meets criteria for genetics testing  (7) adjuvant radiation 03/02/2020 through 03/24/2020 Site Technique Total Dose (Gy) Dose per Fx (Gy) Completed Fx Beam Energies  Breast, Left: Breast_Lt 3D 42.72/42.72 2.67 16/16 6X, 15X     PLAN:  Patient is here for follow-up.  She is currently on observation.  She is a robust 88 year old, has been doing well so far without any concern for recurrence.  Her last mammogram in March 2023 was without any concerns.  Since it has been 2 years or more from her left breast cancer diagnosis, we have discussed about annual follow-up.  Left breast mammogram ordered for March 2024.  We have once again discussed about self breast exam and calling us with any [place]rienne concerns and she expressed understanding. We will plan to see her sooner if she has any Adrienne health concerns.  I have congratulated her on her [place] additional family members to be born in the fall of this year in spring of next year.  Total time spent: 20 min including history,  physical exam, review of records, counseling and coordination of care  *Total Encounter Time as defined by the Centers for Medicare and Medicaid Services includes, in addition to the face-to-face time of a patient visit (documented in the note above) non-face-to-face time: obtaining and reviewing outside history, ordering and reviewing medications, tests or procedures, care coordination (communications with other health care professionals or caregivers) and documentation in the medical record.

## 2022-07-19 ENCOUNTER — Other Ambulatory Visit: Payer: Self-pay | Admitting: Cardiology

## 2022-07-19 NOTE — Telephone Encounter (Signed)
Prescription refill request for Eliquis received. Indication:afib Last office visit:4/23 Scr:1.1 Age: 88 Weight:61.3 kg  Prescription refilled

## 2022-07-26 DIAGNOSIS — R338 Other retention of urine: Secondary | ICD-10-CM | POA: Diagnosis not present

## 2022-07-26 DIAGNOSIS — Z961 Presence of intraocular lens: Secondary | ICD-10-CM | POA: Diagnosis not present

## 2022-07-26 DIAGNOSIS — H524 Presbyopia: Secondary | ICD-10-CM | POA: Diagnosis not present

## 2022-08-15 DIAGNOSIS — I4819 Other persistent atrial fibrillation: Secondary | ICD-10-CM | POA: Diagnosis not present

## 2022-08-15 DIAGNOSIS — Z978 Presence of other specified devices: Secondary | ICD-10-CM | POA: Diagnosis not present

## 2022-08-15 DIAGNOSIS — Z23 Encounter for immunization: Secondary | ICD-10-CM | POA: Diagnosis not present

## 2022-08-15 DIAGNOSIS — D6869 Other thrombophilia: Secondary | ICD-10-CM | POA: Diagnosis not present

## 2022-08-15 DIAGNOSIS — E7800 Pure hypercholesterolemia, unspecified: Secondary | ICD-10-CM | POA: Diagnosis not present

## 2022-08-15 DIAGNOSIS — N1832 Chronic kidney disease, stage 3b: Secondary | ICD-10-CM | POA: Diagnosis not present

## 2022-08-15 DIAGNOSIS — Z6828 Body mass index (BMI) 28.0-28.9, adult: Secondary | ICD-10-CM | POA: Diagnosis not present

## 2022-08-15 DIAGNOSIS — Z853 Personal history of malignant neoplasm of breast: Secondary | ICD-10-CM | POA: Diagnosis not present

## 2022-08-22 DIAGNOSIS — R338 Other retention of urine: Secondary | ICD-10-CM | POA: Diagnosis not present

## 2022-08-23 ENCOUNTER — Ambulatory Visit: Payer: Medicare Other | Admitting: Physician Assistant

## 2022-08-24 DIAGNOSIS — Z23 Encounter for immunization: Secondary | ICD-10-CM | POA: Diagnosis not present

## 2022-08-29 ENCOUNTER — Other Ambulatory Visit: Payer: Self-pay | Admitting: Cardiology

## 2022-08-29 NOTE — Telephone Encounter (Signed)
Rx refill sent to pharmacy. 

## 2022-09-02 DIAGNOSIS — N3000 Acute cystitis without hematuria: Secondary | ICD-10-CM | POA: Diagnosis not present

## 2022-09-03 ENCOUNTER — Other Ambulatory Visit: Payer: Self-pay

## 2022-09-03 ENCOUNTER — Emergency Department (HOSPITAL_COMMUNITY): Payer: Medicare Other

## 2022-09-03 ENCOUNTER — Inpatient Hospital Stay (HOSPITAL_COMMUNITY)
Admission: EM | Admit: 2022-09-03 | Discharge: 2022-09-09 | DRG: 193 | Disposition: A | Discharge status: Home / Self Care | Payer: Medicare Other | Attending: Internal Medicine | Admitting: Internal Medicine

## 2022-09-03 ENCOUNTER — Encounter (HOSPITAL_COMMUNITY): Payer: Self-pay

## 2022-09-03 DIAGNOSIS — Z823 Family history of stroke: Secondary | ICD-10-CM

## 2022-09-03 DIAGNOSIS — E1122 Type 2 diabetes mellitus with diabetic chronic kidney disease: Secondary | ICD-10-CM | POA: Diagnosis present

## 2022-09-03 DIAGNOSIS — Z96653 Presence of artificial knee joint, bilateral: Secondary | ICD-10-CM | POA: Diagnosis present

## 2022-09-03 DIAGNOSIS — I10 Essential (primary) hypertension: Secondary | ICD-10-CM | POA: Diagnosis not present

## 2022-09-03 DIAGNOSIS — I340 Nonrheumatic mitral (valve) insufficiency: Secondary | ICD-10-CM | POA: Diagnosis present

## 2022-09-03 DIAGNOSIS — R110 Nausea: Secondary | ICD-10-CM | POA: Diagnosis present

## 2022-09-03 DIAGNOSIS — Z79899 Other long term (current) drug therapy: Secondary | ICD-10-CM | POA: Diagnosis not present

## 2022-09-03 DIAGNOSIS — I130 Hypertensive heart and chronic kidney disease with heart failure and stage 1 through stage 4 chronic kidney disease, or unspecified chronic kidney disease: Secondary | ICD-10-CM | POA: Diagnosis present

## 2022-09-03 DIAGNOSIS — I272 Pulmonary hypertension, unspecified: Secondary | ICD-10-CM | POA: Diagnosis not present

## 2022-09-03 DIAGNOSIS — Z1152 Encounter for screening for COVID-19: Secondary | ICD-10-CM | POA: Diagnosis not present

## 2022-09-03 DIAGNOSIS — R0603 Acute respiratory distress: Principal | ICD-10-CM

## 2022-09-03 DIAGNOSIS — N390 Urinary tract infection, site not specified: Secondary | ICD-10-CM | POA: Diagnosis not present

## 2022-09-03 DIAGNOSIS — K219 Gastro-esophageal reflux disease without esophagitis: Secondary | ICD-10-CM | POA: Diagnosis present

## 2022-09-03 DIAGNOSIS — Z881 Allergy status to other antibiotic agents status: Secondary | ICD-10-CM

## 2022-09-03 DIAGNOSIS — I2721 Secondary pulmonary arterial hypertension: Secondary | ICD-10-CM | POA: Diagnosis present

## 2022-09-03 DIAGNOSIS — J9601 Acute respiratory failure with hypoxia: Secondary | ICD-10-CM | POA: Diagnosis present

## 2022-09-03 DIAGNOSIS — Z8673 Personal history of transient ischemic attack (TIA), and cerebral infarction without residual deficits: Secondary | ICD-10-CM

## 2022-09-03 DIAGNOSIS — Z9011 Acquired absence of right breast and nipple: Secondary | ICD-10-CM

## 2022-09-03 DIAGNOSIS — I4891 Unspecified atrial fibrillation: Secondary | ICD-10-CM | POA: Diagnosis not present

## 2022-09-03 DIAGNOSIS — Z853 Personal history of malignant neoplasm of breast: Secondary | ICD-10-CM

## 2022-09-03 DIAGNOSIS — Z87891 Personal history of nicotine dependence: Secondary | ICD-10-CM | POA: Diagnosis not present

## 2022-09-03 DIAGNOSIS — R0600 Dyspnea, unspecified: Secondary | ICD-10-CM | POA: Diagnosis not present

## 2022-09-03 DIAGNOSIS — Z7189 Other specified counseling: Secondary | ICD-10-CM | POA: Diagnosis not present

## 2022-09-03 DIAGNOSIS — Z923 Personal history of irradiation: Secondary | ICD-10-CM

## 2022-09-03 DIAGNOSIS — E785 Hyperlipidemia, unspecified: Secondary | ICD-10-CM | POA: Diagnosis present

## 2022-09-03 DIAGNOSIS — N814 Uterovaginal prolapse, unspecified: Secondary | ICD-10-CM | POA: Diagnosis not present

## 2022-09-03 DIAGNOSIS — Z803 Family history of malignant neoplasm of breast: Secondary | ICD-10-CM

## 2022-09-03 DIAGNOSIS — D72829 Elevated white blood cell count, unspecified: Secondary | ICD-10-CM | POA: Diagnosis not present

## 2022-09-03 DIAGNOSIS — I5033 Acute on chronic diastolic (congestive) heart failure: Secondary | ICD-10-CM | POA: Diagnosis present

## 2022-09-03 DIAGNOSIS — Y846 Urinary catheterization as the cause of abnormal reaction of the patient, or of later complication, without mention of misadventure at the time of the procedure: Secondary | ICD-10-CM | POA: Diagnosis present

## 2022-09-03 DIAGNOSIS — Z825 Family history of asthma and other chronic lower respiratory diseases: Secondary | ICD-10-CM

## 2022-09-03 DIAGNOSIS — J189 Pneumonia, unspecified organism: Secondary | ICD-10-CM | POA: Diagnosis not present

## 2022-09-03 DIAGNOSIS — I482 Chronic atrial fibrillation, unspecified: Secondary | ICD-10-CM | POA: Diagnosis present

## 2022-09-03 DIAGNOSIS — I110 Hypertensive heart disease with heart failure: Secondary | ICD-10-CM | POA: Diagnosis not present

## 2022-09-03 DIAGNOSIS — I509 Heart failure, unspecified: Secondary | ICD-10-CM | POA: Diagnosis not present

## 2022-09-03 DIAGNOSIS — I2510 Atherosclerotic heart disease of native coronary artery without angina pectoris: Secondary | ICD-10-CM | POA: Diagnosis present

## 2022-09-03 DIAGNOSIS — I5021 Acute systolic (congestive) heart failure: Secondary | ICD-10-CM | POA: Diagnosis not present

## 2022-09-03 DIAGNOSIS — I4819 Other persistent atrial fibrillation: Secondary | ICD-10-CM | POA: Diagnosis present

## 2022-09-03 DIAGNOSIS — T380X5A Adverse effect of glucocorticoids and synthetic analogues, initial encounter: Secondary | ICD-10-CM | POA: Diagnosis present

## 2022-09-03 DIAGNOSIS — I5031 Acute diastolic (congestive) heart failure: Secondary | ICD-10-CM | POA: Diagnosis not present

## 2022-09-03 DIAGNOSIS — N1832 Chronic kidney disease, stage 3b: Secondary | ICD-10-CM | POA: Diagnosis not present

## 2022-09-03 DIAGNOSIS — B965 Pseudomonas (aeruginosa) (mallei) (pseudomallei) as the cause of diseases classified elsewhere: Secondary | ICD-10-CM | POA: Diagnosis not present

## 2022-09-03 DIAGNOSIS — J45909 Unspecified asthma, uncomplicated: Secondary | ICD-10-CM | POA: Diagnosis present

## 2022-09-03 DIAGNOSIS — Z800 Family history of malignant neoplasm of digestive organs: Secondary | ICD-10-CM

## 2022-09-03 DIAGNOSIS — Z888 Allergy status to other drugs, medicaments and biological substances status: Secondary | ICD-10-CM

## 2022-09-03 DIAGNOSIS — R062 Wheezing: Secondary | ICD-10-CM | POA: Diagnosis not present

## 2022-09-03 DIAGNOSIS — Z7901 Long term (current) use of anticoagulants: Secondary | ICD-10-CM

## 2022-09-03 DIAGNOSIS — Z960 Presence of urogenital implants: Secondary | ICD-10-CM | POA: Diagnosis present

## 2022-09-03 DIAGNOSIS — T83511A Infection and inflammatory reaction due to indwelling urethral catheter, initial encounter: Secondary | ICD-10-CM | POA: Diagnosis present

## 2022-09-03 DIAGNOSIS — R54 Age-related physical debility: Secondary | ICD-10-CM | POA: Diagnosis present

## 2022-09-03 DIAGNOSIS — I5030 Unspecified diastolic (congestive) heart failure: Secondary | ICD-10-CM | POA: Diagnosis present

## 2022-09-03 DIAGNOSIS — M25512 Pain in left shoulder: Secondary | ICD-10-CM | POA: Diagnosis not present

## 2022-09-03 DIAGNOSIS — I4821 Permanent atrial fibrillation: Secondary | ICD-10-CM | POA: Diagnosis present

## 2022-09-03 LAB — CBC WITH DIFFERENTIAL/PLATELET
Abs Immature Granulocytes: 0.06 10*3/uL (ref 0.00–0.07)
Basophils Absolute: 0 10*3/uL (ref 0.0–0.1)
Basophils Relative: 0 %
Eosinophils Absolute: 0.1 10*3/uL (ref 0.0–0.5)
Eosinophils Relative: 1 %
HCT: 39.5 % (ref 36.0–46.0)
Hemoglobin: 13.1 g/dL (ref 12.0–15.0)
Immature Granulocytes: 0 %
Lymphocytes Relative: 15 %
Lymphs Abs: 2.4 10*3/uL (ref 0.7–4.0)
MCH: 31 pg (ref 26.0–34.0)
MCHC: 33.2 g/dL (ref 30.0–36.0)
MCV: 93.4 fL (ref 80.0–100.0)
Monocytes Absolute: 0.5 10*3/uL (ref 0.1–1.0)
Monocytes Relative: 3 %
Neutro Abs: 13.1 10*3/uL — ABNORMAL HIGH (ref 1.7–7.7)
Neutrophils Relative %: 81 %
Platelets: 219 10*3/uL (ref 150–400)
RBC: 4.23 MIL/uL (ref 3.87–5.11)
RDW: 13.2 % (ref 11.5–15.5)
WBC: 16.2 10*3/uL — ABNORMAL HIGH (ref 4.0–10.5)
nRBC: 0 % (ref 0.0–0.2)

## 2022-09-03 LAB — I-STAT VENOUS BLOOD GAS, ED
Acid-Base Excess: 5 mmol/L — ABNORMAL HIGH (ref 0.0–2.0)
Bicarbonate: 29.4 mmol/L — ABNORMAL HIGH (ref 20.0–28.0)
Calcium, Ion: 1.1 mmol/L — ABNORMAL LOW (ref 1.15–1.40)
HCT: 40 % (ref 36.0–46.0)
Hemoglobin: 13.6 g/dL (ref 12.0–15.0)
O2 Saturation: 56 %
Potassium: 3.5 mmol/L (ref 3.5–5.1)
Sodium: 136 mmol/L (ref 135–145)
TCO2: 31 mmol/L (ref 22–32)
pCO2, Ven: 41.9 mmHg — ABNORMAL LOW (ref 44–60)
pH, Ven: 7.455 — ABNORMAL HIGH (ref 7.25–7.43)
pO2, Ven: 28 mmHg — CL (ref 32–45)

## 2022-09-03 LAB — URINALYSIS, ROUTINE W REFLEX MICROSCOPIC
Bilirubin Urine: NEGATIVE
Glucose, UA: NEGATIVE mg/dL
Ketones, ur: NEGATIVE mg/dL
Nitrite: NEGATIVE
Protein, ur: NEGATIVE mg/dL
Specific Gravity, Urine: 1.006 (ref 1.005–1.030)
pH: 6 (ref 5.0–8.0)

## 2022-09-03 LAB — COMPREHENSIVE METABOLIC PANEL
ALT: 17 U/L (ref 0–44)
AST: 25 U/L (ref 15–41)
Albumin: 3.9 g/dL (ref 3.5–5.0)
Alkaline Phosphatase: 85 U/L (ref 38–126)
Anion gap: 10 (ref 5–15)
BUN: 21 mg/dL (ref 8–23)
CO2: 28 mmol/L (ref 22–32)
Calcium: 9.4 mg/dL (ref 8.9–10.3)
Chloride: 99 mmol/L (ref 98–111)
Creatinine, Ser: 1.08 mg/dL — ABNORMAL HIGH (ref 0.44–1.00)
GFR, Estimated: 48 mL/min — ABNORMAL LOW (ref >60)
Glucose, Bld: 149 mg/dL — ABNORMAL HIGH (ref 70–99)
Potassium: 3.5 mmol/L (ref 3.5–5.1)
Sodium: 137 mmol/L (ref 135–145)
Total Bilirubin: 0.8 mg/dL (ref 0.3–1.2)
Total Protein: 7.3 g/dL (ref 6.5–8.1)

## 2022-09-03 LAB — HIV ANTIBODY (ROUTINE TESTING W REFLEX): HIV Screen 4th Generation wRfx: NONREACTIVE

## 2022-09-03 LAB — RESP PANEL BY RT-PCR (RSV, FLU A&B, COVID)  RVPGX2
Influenza A by PCR: NEGATIVE
Influenza B by PCR: NEGATIVE
Resp Syncytial Virus by PCR: NEGATIVE
SARS Coronavirus 2 by RT PCR: NEGATIVE

## 2022-09-03 LAB — TROPONIN I (HIGH SENSITIVITY)
Troponin I (High Sensitivity): 9 ng/L (ref <18)
Troponin I (High Sensitivity): 12 ng/L (ref <18)

## 2022-09-03 LAB — TSH: TSH: 3.242 u[IU]/mL (ref 0.350–4.500)

## 2022-09-03 LAB — MAGNESIUM: Magnesium: 1.7 mg/dL (ref 1.7–2.4)

## 2022-09-03 LAB — BRAIN NATRIURETIC PEPTIDE: B Natriuretic Peptide: 420.7 pg/mL — ABNORMAL HIGH (ref 0.0–100.0)

## 2022-09-03 LAB — PROCALCITONIN: Procalcitonin: <0.1 ng/mL

## 2022-09-03 MED ORDER — PANTOPRAZOLE SODIUM 40 MG PO TBEC
40.0000 mg | DELAYED_RELEASE_TABLET | Freq: Every day | ORAL | Status: DC
  Administered 2022-09-04 – 2022-09-09 (×6): 40 mg via ORAL
  Filled 2022-09-03 (×6): qty 1

## 2022-09-03 MED ORDER — FUROSEMIDE 10 MG/ML IJ SOLN
40.0000 mg | Freq: Two times a day (BID) | INTRAMUSCULAR | Status: DC
  Administered 2022-09-03: 40 mg via INTRAVENOUS
  Filled 2022-09-03: qty 4

## 2022-09-03 MED ORDER — SODIUM CHLORIDE 0.9 % IV SOLN
2.0000 g | INTRAVENOUS | Status: DC
  Administered 2022-09-04 – 2022-09-07 (×4): 2 g via INTRAVENOUS
  Filled 2022-09-03 (×4): qty 20

## 2022-09-03 MED ORDER — NITROGLYCERIN 2 % TD OINT: 1.0000 [in_us] | TOPICAL_OINTMENT | Freq: Once | TRANSDERMAL | Status: DC

## 2022-09-03 MED ORDER — DILTIAZEM HCL 60 MG PO TABS
60.0000 mg | ORAL_TABLET | Freq: Three times a day (TID) | ORAL | Status: DC
  Administered 2022-09-03 – 2022-09-09 (×19): 60 mg via ORAL
  Filled 2022-09-03 (×21): qty 1

## 2022-09-03 MED ORDER — ACETAMINOPHEN 650 MG RE SUPP: 650.0000 mg | Freq: Four times a day (QID) | RECTAL | Status: DC | PRN

## 2022-09-03 MED ORDER — ASPIRIN 81 MG PO CHEW
324.0000 mg | CHEWABLE_TABLET | Freq: Once | ORAL | Status: AC
  Administered 2022-09-03: 324 mg via ORAL
  Filled 2022-09-03: qty 4

## 2022-09-03 MED ORDER — ATORVASTATIN CALCIUM 10 MG PO TABS
10.0000 mg | ORAL_TABLET | Freq: Every day | ORAL | Status: DC
  Administered 2022-09-03 – 2022-09-09 (×7): 10 mg via ORAL
  Filled 2022-09-03 (×7): qty 1

## 2022-09-03 MED ORDER — METOPROLOL TARTRATE 100 MG PO TABS
100.0000 mg | ORAL_TABLET | Freq: Two times a day (BID) | ORAL | Status: DC
  Administered 2022-09-03 – 2022-09-09 (×13): 100 mg via ORAL
  Filled 2022-09-03 (×7): qty 1
  Filled 2022-09-03: qty 4
  Filled 2022-09-03 (×6): qty 1

## 2022-09-03 MED ORDER — SODIUM CHLORIDE 0.9 % IV SOLN
1.0000 g | Freq: Once | INTRAVENOUS | Status: AC
  Administered 2022-09-03: 1 g via INTRAVENOUS
  Filled 2022-09-03: qty 10

## 2022-09-03 MED ORDER — POTASSIUM CHLORIDE CRYS ER 20 MEQ PO TBCR
20.0000 meq | EXTENDED_RELEASE_TABLET | Freq: Every day | ORAL | Status: DC
  Administered 2022-09-04 – 2022-09-09 (×6): 20 meq via ORAL
  Filled 2022-09-03 (×6): qty 1

## 2022-09-03 MED ORDER — POTASSIUM CHLORIDE CRYS ER 20 MEQ PO TBCR
40.0000 meq | EXTENDED_RELEASE_TABLET | Freq: Once | ORAL | Status: AC
  Administered 2022-09-03: 40 meq via ORAL
  Filled 2022-09-03: qty 2

## 2022-09-03 MED ORDER — SODIUM CHLORIDE 0.9 % IV SOLN
500.0000 mg | Freq: Once | INTRAVENOUS | Status: AC
  Administered 2022-09-03: 500 mg via INTRAVENOUS
  Filled 2022-09-03: qty 5

## 2022-09-03 MED ORDER — SODIUM CHLORIDE 0.9 % IV SOLN: 1.0000 g | INTRAVENOUS | Status: DC

## 2022-09-03 MED ORDER — MAGNESIUM SULFATE IN D5W 1-5 GM/100ML-% IV SOLN
1.0000 g | Freq: Once | INTRAVENOUS | Status: AC
  Administered 2022-09-03: 1 g via INTRAVENOUS
  Filled 2022-09-03: qty 100

## 2022-09-03 MED ORDER — CHLORHEXIDINE GLUCONATE CLOTH 2 % EX PADS
6.0000 | MEDICATED_PAD | Freq: Every day | CUTANEOUS | Status: DC
  Administered 2022-09-04 – 2022-09-09 (×6): 6 via TOPICAL

## 2022-09-03 MED ORDER — ACETAMINOPHEN 325 MG PO TABS
650.0000 mg | ORAL_TABLET | Freq: Four times a day (QID) | ORAL | Status: DC | PRN
  Administered 2022-09-05 – 2022-09-08 (×3): 650 mg via ORAL
  Filled 2022-09-03 (×3): qty 2

## 2022-09-03 MED ORDER — APIXABAN 5 MG PO TABS
5.0000 mg | ORAL_TABLET | Freq: Two times a day (BID) | ORAL | Status: DC
  Administered 2022-09-03 – 2022-09-09 (×13): 5 mg via ORAL
  Filled 2022-09-03 (×13): qty 1

## 2022-09-03 MED ORDER — FUROSEMIDE 10 MG/ML IJ SOLN
40.0000 mg | Freq: Once | INTRAMUSCULAR | Status: AC
  Administered 2022-09-03: 40 mg via INTRAVENOUS
  Filled 2022-09-03: qty 4

## 2022-09-03 MED ORDER — RISAQUAD PO CAPS
1.0000 | ORAL_CAPSULE | Freq: Every day | ORAL | Status: DC
  Administered 2022-09-04 – 2022-09-09 (×6): 1 via ORAL
  Filled 2022-09-03 (×6): qty 1

## 2022-09-03 MED ORDER — LEVALBUTEROL HCL 0.63 MG/3ML IN NEBU: 0.6300 mg | INHALATION_SOLUTION | Freq: Four times a day (QID) | RESPIRATORY_TRACT | Status: DC | PRN

## 2022-09-03 NOTE — Progress Notes (Signed)
Patient arrived to [facility] room 14. Alert and oriented x4. Pain level 0/10. Foley bag emptied and recorded. Family at bedside. Bed in lowest position. Call light in reach. Will continue to monitor pt.

## 2022-09-03 NOTE — ED Notes (Signed)
I place a clean urine bag on catheter before collecting urine from bag

## 2022-09-03 NOTE — Progress Notes (Signed)
Patient refused the use of BIPAP for the evening.  

## 2022-09-03 NOTE — ED Triage Notes (Signed)
Pt arrived via [facility] from home for difficulty breathing. Per EMS, pt has wheezing in all lobes of lungs. EMS gave atrovent 0.5 mg, solumedrol 125 mg, and albuterol 10 mg. Pt arrived on nebulizer, tachypneic and labored breathing. Pt is A&Ox4. Dr Scot Dock at bedside

## 2022-09-03 NOTE — Progress Notes (Signed)
RT came by to check on pt and pt was off BIPAP on RA. Pt states her breathing is much better. Sats are 96%. RT to continue to monitor as needed.

## 2022-09-03 NOTE — ED Provider Notes (Signed)
[facility] EMERGENCY DEPARTMENT Provider Note   CSN: [id number] Arrival date & time: 09/03/22  0827     History  Chief Complaint  Patient presents with   Respiratory Distress    Adrienne [name] is a 88 y.o. female.  HPI Patient presents for shortness of breath.  Medical history includes arthritis, pulmonary hypertension, atrial fibrillation, HLD, HTN, CVA, CAD, GERD, breast CA, CHF.  She was in her normal state of health up until this morning.  She was recently started on antibiotic for treatment of UTI.  Morning, she woke up feeling nauseous.  Symptoms progressed to a lower chest/epigastric pain and shortness of breath.  She states that she has had similar episodes in the past which typically resolves after several minutes.  Today's episode persisted.  EMS was called.  EMS noted wheezing on auscultation and treated her for reactive airway disease with DuoNeb, albuterol, and Solu-Medrol.  She did not have any relief of symptoms with these treatments.  Per chart review, she does not have history of known reactive airway disease.  Currently, she endorses ongoing lower chest pain and shortness of breath.  She does take 40 mg of Lasix daily.  She takes Eliquis, metoprolol, and Cardizem for her atrial fibrillation.  Last doses of medications were 8 PM last night.  EMS noted vital signs notable for hypertension with SBP greater than 200 during transit.    Home Medications Prior to Admission medications   Medication Sig Start Date End Date Taking? Authorizing Provider  Ascorbic Acid (VITAMIN C WITH ROSE HIPS) 500 MG tablet Take 500 mg by mouth in the morning and at bedtime.   Yes [provider]  atorvastatin (LIPITOR) 10 MG tablet Take 1 tablet (10 mg total) by mouth daily. 08/29/22  Yes Jerline Pain, MD  diltiazem (CARDIZEM) 60 MG tablet Take 1 tablet (60 mg total) by mouth 3 (three) times daily. 08/29/22  Yes Skains, Thana Farr, MD  ELIQUIS 5 MG TABS tablet  TAKE 1 TABLET TWICE A DAY Patient taking differently: Take 5 mg by mouth 2 (two) times daily. 07/19/22  Yes Jerline Pain, MD  furosemide (LASIX) 40 MG tablet Take 1 tablet (40 mg total) by mouth daily. 09/15/21  Yes Jerline Pain, MD  metoprolol tartrate (LOPRESSOR) 100 MG tablet Take 1 tablet (100 mg total) by mouth 2 (two) times daily. 08/29/22  Yes Jerline Pain, MD  nitrofurantoin (MACRODANTIN) 100 MG capsule Take 100 mg by mouth 2 (two) times daily. 07/29/22  Yes [provider]  omeprazole (PRILOSEC) 10 MG capsule Take 10 mg by mouth every other day. 03/21/19  Yes [provider]  Potassium Chloride ER 20 MEQ TBCR Take 20 mEq by mouth daily. 01/16/22  Yes [provider]  acetaminophen (TYLENOL) 500 MG tablet Take 500-1,000 mg by mouth every 12 (twelve) hours as needed (pain).     [provider]  albuterol (VENTOLIN HFA) 108 (90 Base) MCG/ACT inhaler Inhale 1 puff into the lungs every 6 (six) hours as needed for wheezing or shortness of breath (Cough). 05/13/20   Martyn Ehrich, NP  Multiple Vitamin (MULTIVITAMIN WITH MINERALS) TABS tablet Take 1 tablet by mouth daily. Centrum for Women 50+    [provider]  Probiotic Product (UP4 PROBIOTICS WOMENS PO) Take 1 capsule by mouth daily.    [provider]      Allergies    Valium [diazepam], Amiodarone hcl [amiodarone], Compazine [prochlorperazine], Other, Thorazine [chlorpromazine], Zometa [zoledronic  acid], Ciprofloxacin, and Doxycycline    Review of Systems   Review of Systems  Constitutional:  Positive for fatigue.  Respiratory:  Positive for chest tightness and shortness of breath.   Cardiovascular:  Positive for chest pain.  Gastrointestinal:  Positive for nausea.  All other systems reviewed and are negative.   Physical Exam Updated Vital Signs BP (!) 152/55   Pulse 99   Temp 98.2 F (36.8 C) (Oral)   Resp (!) 23   Ht 5' (1.524 m)   Wt 61.3 kg   LMP 09/12/1973  (Approximate)   SpO2 97%   BMI 26.39 kg/m  Physical Exam Vitals and nursing note reviewed.  Constitutional:      General: She is not in acute distress.    Appearance: She is well-developed and normal weight. She is ill-appearing. She is not toxic-appearing or diaphoretic.  HENT:     Head: Normocephalic and atraumatic.     Right Ear: External ear normal.     Left Ear: External ear normal.     Nose: Nose normal.     Mouth/Throat:     Mouth: Mucous membranes are moist.  Eyes:     Extraocular Movements: Extraocular movements intact.     Conjunctiva/sclera: Conjunctivae normal.  Cardiovascular:     Rate and Rhythm: Normal rate and regular rhythm.     Heart sounds: No murmur heard. Pulmonary:     Effort: Pulmonary effort is normal. No respiratory distress.     Breath sounds: Wheezing and rales present.  Abdominal:     General: There is no distension.     Palpations: Abdomen is soft.     Tenderness: There is no abdominal tenderness.  Genitourinary:    Comments: Chronic indwelling Foley catheter with clear-yellow drainage contents Musculoskeletal:        General: No swelling. Normal range of motion.     Cervical back: Normal range of motion and neck supple.     Right lower leg: No edema.     Left lower leg: No edema.  Skin:    General: Skin is warm and dry.     Capillary Refill: Capillary refill takes less than 2 seconds.     Coloration: Skin is not jaundiced or pale.  Neurological:     General: No focal deficit present.     Mental Status: She is alert and oriented to person, place, and time.     Cranial Nerves: No cranial nerve deficit.     Sensory: No sensory deficit.     Motor: No weakness.     Coordination: Coordination normal.  Psychiatric:        Mood and Affect: Mood normal.        Behavior: Behavior normal.        Thought Content: Thought content normal.        Judgment: Judgment normal.     ED Results / Procedures / Treatments   Labs (all labs ordered are  listed, but only abnormal results are displayed) Labs Reviewed  COMPREHENSIVE METABOLIC PANEL - Abnormal; Notable for the following components:      Result Value   Glucose, Bld 149 (*)    Creatinine, Ser 1.08 (*)    GFR, Estimated 48 (*)    All other components within normal limits  CBC WITH DIFFERENTIAL/PLATELET - Abnormal; Notable for the following components:   WBC 16.2 (*)    Neutro Abs 13.1 (*)    All other components within normal limits  BRAIN NATRIURETIC PEPTIDE -  Abnormal; Notable for the following components:   B Natriuretic Peptide 420.7 (*)    All other components within normal limits  I-STAT VENOUS BLOOD GAS, ED - Abnormal; Notable for the following components:   pH, Ven 7.455 (*)    pCO2, Ven 41.9 (*)    pO2, Ven 28 (*)    Bicarbonate 29.4 (*)    Acid-Base Excess 5.0 (*)    Calcium, Ion 1.10 (*)    All other components within normal limits  RESP PANEL BY RT-PCR (RSV, FLU A&B, COVID)  RVPGX2  URINE CULTURE  CULTURE, BLOOD (ROUTINE X 2)  CULTURE, BLOOD (ROUTINE X 2)  MAGNESIUM  URINALYSIS, ROUTINE W REFLEX MICROSCOPIC  TROPONIN I (HIGH SENSITIVITY)  TROPONIN I (HIGH SENSITIVITY)    EKG EKG Interpretation  Date/Time:  Saturday September 03 2022 08:38:35 EST Ventricular Rate:  105 PR Interval:    QRS Duration: 171 QT Interval:  385 QTC Calculation: 479 R Axis:   -83 Text Interpretation: Atrial fibrillation Nonspecific IVCD with LAD LVH with secondary repolarization abnormality Artifact in lead(s) I II aVR aVL aVF V1 V5 Confirmed by Godfrey Pick [phone]) on 09/03/2022 9:19:23 AM  Radiology DG Chest Port 1 View  Result Date: 09/03/2022 CLINICAL DATA:  Dyspnea. History of CAD, pulmonary hypertension, and left breast cancer status post lumpectomy. EXAM: PORTABLE CHEST 1 VIEW COMPARISON:  Chest radiograph 08/17/2021 and earlier; CT chest 06/29/2020 FINDINGS: Of note, patient is mildly rotated on this portable examination. Borderline enlarged cardiac silhouette,  stable. Aortic calcifications. Subtle interstitial and patchy airspace opacity of the right lung base. Left lung is clear. No pleural effusion or pneumothorax. Diffuse osteopenia. Surgical clips overlie the left chest wall and left axilla. IMPRESSION: Subtle interstitial and patchy airspace opacity of the right lung base, which may reflect atelectasis versus infection in the appropriate clinical context. Electronically Signed   By: Ileana Roup M.D.   On: 09/03/2022 09:38    Procedures Procedures    Medications Ordered in ED Medications  cefTRIAXone (ROCEPHIN) 1 g in sodium chloride 0.9 % 100 mL IVPB (has no administration in time range)  azithromycin (ZITHROMAX) 500 mg in sodium chloride 0.9 % 250 mL IVPB (500 mg Intravenous New Bag/Given 09/03/22 1036)  furosemide (LASIX) injection 40 mg (has no administration in time range)  magnesium sulfate IVPB 1 g 100 mL (has no administration in time range)  aspirin chewable tablet 324 mg (324 mg Oral Given 09/03/22 0854)  potassium chloride SA (KLOR-CON M) CR tablet 40 mEq (40 mEq Oral Given 09/03/22 1032)    ED Course/ Medical Decision Making/ A&P                           Medical Decision Making Amount and/or Complexity of Data Reviewed Labs: ordered. Radiology: ordered.  Risk OTC drugs. Prescription drug management.   This patient presents to the ED for concern of respiratory distress, this involves an extensive number of treatment options, and is a complaint that carries with it a high risk of complications and morbidity.  The differential diagnosis includes CHF exacerbation, pneumonia, PE, panic attack, ACS   Co morbidities that complicate the patient evaluation  arthritis, pulmonary hypertension, atrial fibrillation, HLD, HTN, CVA, CAD, GERD, breast CA, CHF   Additional history obtained:  Additional history obtained from EMS, patient's family External records from outside source obtained and reviewed including EMR   Lab  Tests:  I Ordered, and personally interpreted labs.  The pertinent results  include: BNP is elevated consistent with levels 1 year ago when she was previously hospitalized for CHF exacerbation; initial troponin is normal; a leukocytosis is present.  Hemoglobin is normal.  Potassium and magnesium are low-normal.   Imaging Studies ordered:  I ordered imaging studies including chest x-ray I independently visualized and interpreted imaging which showed findings concerning for right lower lobe pneumonia I agree with the radiologist interpretation   Cardiac Monitoring: / EKG:  The patient was maintained on a cardiac monitor.  I personally viewed and interpreted the cardiac monitored which showed an underlying rhythm of: Atrial fibrillation  Problem List / ED Course / Critical interventions / Medication management  Patient presents for acute onset of shortness of breath.  This was this morning.  He was preceded by nausea and lower chest pain.  She received no relief of symptoms with DuoNeb, albuterol, and Solu-Medrol which she received prior to arrival with EMS due to evidence of wheezing on lung auscultation.  On arrival in the ED, patient is tachypneic with increased work of breathing.  Despite this, she is able to speak in complete sentences.  BiPAP was initiated for her increased work of breathing.  Although EMS reported SBP's greater than 200, patient's blood pressure was 110/90 after initiation of BiPAP.  She is tolerating BiPAP well.  Bedside ultrasound shows right apical B-lines.  I suspect volume overload as a cause of her symptoms.  Diagnostic workup was initiated.  On x-ray, there is an area of focal opacity in right lower lobe.  Patient also has a leukocytosis on lab work.  She was treated empirically for pneumonia with ceftriaxone and azithromycin.  BNP is elevated.  This is consistent with prior lab values, however, on chart review, these values were obtained during her prior hospitalization  for CHF exacerbation 1 year ago.  Patient was given potassium chloride magnesium sulfate for electrolyte optimization.  Dose of IV Lasix was ordered.  Patient maintained normal blood pressure.  After 2 hours on BiPAP, BiPAP was removed.  Patient had improved work of breathing.  SpO2 remained normal on room air.  She was able to speak in complete sentences and is no longer with accessory muscle use.  Given the severity of her presentation on arrival, patient to be admitted for continued treatment and observation. I ordered medication including ASA for concern for ACS; Rocephin and Zithromax for empiric treatment of pneumonia; calcium chloride and magnesium sulfate for electrolyte optimization; Lasix for diuresis Reevaluation of the patient after these medicines showed that the patient improved I have reviewed the patients home medicines and have made adjustments as needed   Social Determinants of Health:  Lives at home with family  CRITICAL CARE Performed by: Godfrey Pick   Total critical care time: 32 minutes  Critical care time was exclusive of separately billable procedures and treating other patients.  Critical care was necessary to treat or prevent imminent or life-threatening deterioration.  Critical care was time spent personally by me on the following activities: development of treatment plan with patient and/or surrogate as well as nursing, discussions with consultants, evaluation of patient's response to treatment, examination of patient, obtaining history from patient or surrogate, ordering and performing treatments and interventions, ordering and review of laboratory studies, ordering and review of radiographic studies, pulse oximetry and re-evaluation of patient's condition.         Final Clinical Impression(s) / ED Diagnoses Final diagnoses:  Respiratory distress  Acute on chronic congestive heart failure, unspecified heart failure type (  [facility])    Rx / DC Orders ED  Discharge Orders     None         Godfrey Pick, MD 09/03/22 1037

## 2022-09-03 NOTE — H&P (Signed)
History and Physical    Patient: Adrienne [name] HER:[id number] DOB: 02-14-1928 DOA: 09/03/2022 DOS: the patient was seen and examined on 09/03/2022 PCP: Kathyrn Lass, MD  Patient coming from: Home via E<S  Chief Complaint:  Chief Complaint  Patient presents with   Respiratory Distress   HPI: [name] is a 88 y.o. female with medical history significant of hypertension, hyperlipidemia, atrial fibrillation, CAD, pulmonary hypertension, CKD, breast cancer s/p radiation, and GERD presents with complaints of acute onset of shortness of breath.  Waking up this morning feeling short of breath and nauseated.  She took some Pepto-Bismol and dozed off for a second.  However, when she reported still feeling short of breath and noted having a cough.  She did not feel like she had a fever, but did report feelings chills.  Also she admitted that she has been having some lower abdominal discomfort.  She was seen by [facility] urology urology yesterday and started on empiric antibiotics of nitrofurantoin for presumed UTI.  Patient reports that her weight has been stable.  Review of records note that the patient had been admitted into the hospital last in 08/2021 with a congestive heart failure exacerbation requiring IV diuresis.  In route with EMS patient was noted to be tachypneic and wheezing and all lung fields.  Patient received DuoNeb breathing treatment and 125 mg of Solu-Medrol IV.  Upon admission into the emergency department patient was noted to be afebrile with heart rates 83-1 02, respirations 18-33, blood pressures 110/94-157/72, and O2 saturations as low as 86% with improvement on BiPAP greater than 92%.  Labs significant for WBC 16.2, BUN 21, creatinine 1.08, high-sensitivity troponin 9, and BNP 420.7.  Chest x-ray noted subtle interstitial and patchy airspace opacity of the right lung base concerning for atelectasis versus infection.  Respiratory virus panel was pending.   Patient has been given full dose aspirin, Lasix 40 mg IV, magnesium 1 g IV, potassium chloride 40 meq p.o., Rocephin, and azithromycin.  She was able to be weaned off BiPAP to room air. Review of Systems: As mentioned in the history of present illness. All other systems reviewed and are negative. Past Medical History:  Diagnosis Date   Afib ([place])    Arthritis of ankle    CAD (coronary artery disease)    Cancer ([place]) 2012   breast cancer-right   Compression fracture of T12 vertebra ([facility])    Diverticulosis    Edema    Elevated uric acid in blood    GERD (gastroesophageal reflux disease)    GI bleed 12/2015   Hyperlipidemia    Hypertension    Long-term (current) use of anticoagulants    Moderate mitral regurgitation    Personal history of radiation therapy    Pulmonary HTN ([place])    Echo 1/19: EF 60-65, no RWMA, Ao sclerosis, trivial AI, mild MR, mod BAE, mod TR, PASP 49   Stroke ([place]) 01/25/2016   TIA (transient ischemic attack) 09/2011   Urinary incontinence    Urinary retention    Past Surgical History:  Procedure Laterality Date   APPENDECTOMY     AXILLARY SENTINEL NODE BIOPSY Left 01/22/2020   Procedure: Left Axillary Sentinel Node Biopsy;  Surgeon: Rolm Bookbinder, MD;  Location: [place];  Service: General;  Laterality: Left;   BREAST LUMPECTOMY WITH RADIOACTIVE SEED AND SENTINEL LYMPH NODE BIOPSY Left 01/22/2020   Procedure: LEFT BREAST LUMPECTOMY WITH RADIOACTIVE SEED;  Surgeon: Rolm Bookbinder, MD;  Location: [place];  Service: General;  Laterality: Left;   BREAST SURGERY  2012   Rt.mastectomy--[place], [place] CATHETERIZATION  2013   CATARACT EXTRACTION, BILATERAL     CERVICAL CONIZATION W/BX N/A 08/08/2020   Procedure: VAGINAL SIDEWALL BIOPSY;  Surgeon: Megan Salon, MD;  Location: [place];  Service: Gynecology;  Laterality: N/A;   DILATION AND CURETTAGE OF UTERUS     in her early 55's for DUB   ESOPHAGOGASTRODUODENOSCOPY Left 01/08/2016   Procedure:  ESOPHAGOGASTRODUODENOSCOPY (EGD);  Surgeon: Arta Silence, MD;  Location: [facility];  Service: Endoscopy;  Laterality: Left;   GALLBLADDER SURGERY     MASTECTOMY Right 2012   RADIOLOGY WITH ANESTHESIA N/A 01/25/2016   Procedure: RADIOLOGY WITH ANESTHESIA;  Surgeon: Luanne Bras, MD;  Location: [place];  Service: Radiology;  Laterality: N/A;   REPLACEMENT TOTAL KNEE BILATERAL     TONSILLECTOMY AND ADENOIDECTOMY     as a child   Social History:  reports that she quit smoking about 74 years ago. Her smoking use included cigarettes. She has a 0.50 pack-year smoking history. She has never used smokeless tobacco. She reports that she does not drink alcohol and does not use drugs.  Allergies  Allergen Reactions   Valium [Diazepam] Other (See Comments)    HYPERACTIVITY    Amiodarone Hcl [Amiodarone] Other (See Comments)    AFIB   Compazine [Prochlorperazine] Other (See Comments)    HYPERACTIVITY    Other Other (See Comments)    "Kidney dye"--patient states this gave her severe shakes/chills   Thorazine [Chlorpromazine] Other (See Comments)    HYPERACTIVITY    Zometa [Zoledronic Acid] Other (See Comments)    PROBLEMS WITH EYES  TIA   Ciprofloxacin Swelling    "swelling" in Rt.elbow   Doxycycline Nausea Only    Family History  Problem Relation Age of Onset   Asthma Mother    CVA Father    Atrial fibrillation Sister        HAS PACER   Pneumonia Brother    Cancer Daughter        COLON CANCER   Cancer Maternal Grandmother        breast cancer    Prior to Admission medications   Medication Sig Start Date End Date Taking? Authorizing Provider  acetaminophen (TYLENOL) 500 MG tablet Take 500-1,000 mg by mouth every 12 (twelve) hours as needed (pain).    Yes [provider]  Ascorbic Acid (VITAMIN C WITH ROSE HIPS) 500 MG tablet Take 500 mg by mouth in the morning and at bedtime.   Yes [provider]  atorvastatin (LIPITOR) 10 MG tablet Take 1 tablet (10 mg  total) by mouth daily. 08/29/22  Yes Jerline Pain, MD  diltiazem (CARDIZEM) 60 MG tablet Take 1 tablet (60 mg total) by mouth 3 (three) times daily. 08/29/22  Yes Skains, Thana Farr, MD  ELIQUIS 5 MG TABS tablet TAKE 1 TABLET TWICE A DAY Patient taking differently: Take 5 mg by mouth 2 (two) times daily. 07/19/22  Yes Jerline Pain, MD  furosemide (LASIX) 40 MG tablet Take 1 tablet (40 mg total) by mouth daily. 09/15/21  Yes Jerline Pain, MD  metoprolol tartrate (LOPRESSOR) 100 MG tablet Take 1 tablet (100 mg total) by mouth 2 (two) times daily. 08/29/22  Yes Jerline Pain, MD  Multiple Vitamin (MULTIVITAMIN WITH MINERALS) TABS tablet Take 1 tablet by mouth daily. Centrum for Women 50+   Yes [provider]  nitrofurantoin (MACRODANTIN) 100 MG capsule Take 100 mg  by mouth 2 (two) times daily. 08/03/22 09/10/22 Yes [provider]  omeprazole (PRILOSEC) 10 MG capsule Take 10 mg by mouth every other day. 03/21/19  Yes [provider]  Potassium Chloride ER 20 MEQ TBCR Take 20 mEq by mouth daily. 01/16/22  Yes [provider]  Probiotic Product (UP4 PROBIOTICS WOMENS PO) Take 1 capsule by mouth daily.   Yes [provider]  albuterol (VENTOLIN HFA) 108 (90 Base) MCG/ACT inhaler Inhale 1 puff into the lungs every 6 (six) hours as needed for wheezing or shortness of breath (Cough). Patient not taking: Reported on 09/03/2022 05/13/20   Martyn Ehrich, NP    Physical Exam: Vitals:   09/03/22 0900 09/03/22 0930 09/03/22 1000 09/03/22 1030  BP: (!) 157/72 (!) 139/56 (!) 124/53 (!) 152/55  Pulse: 99 100 99 99  Resp: 20 19 (!) 30 (!) 23  Temp:      TempSrc:      SpO2: 97% 100% 92% 97%  Weight:      Height:       Constitutional: Pleasant elderly female currently in no acute distress Eyes: PERRL, lids and conjunctivae normal ENMT: Mucous membranes are moist.  Hard of hearing Neck: normal, supple, JVD present Respiratory: Mildly tachypneic with talking no  significant wheezing or rhonchi appreciated. Cardiovascular: Irregular irregular.  1+ lower extremity edema.. 2+ pedal pulses.  Abdomen: Mild lower abdominal tenderness appreciated.  Bowel sounds present all 4 quadrants. Musculoskeletal: no clubbing / cyanosis. No joint deformity upper and lower extremities. Good ROM, no contractures. Normal muscle tone.  Skin: no rashes, lesions, ulcers. No induration Neurologic: CN 2-12 grossly intact. Strength 5/5 in all 4.  Psychiatric: Normal judgment and insight. Alert and oriented x 3. Normal mood.   Data Reviewed:  EKG revealed atrial fibrillation at 102 bpm with QTc 479, but was noted to have significant background artifact.  Reviewed labs and imaging and pertinent records as noted above in HPI  Assessment and Plan:  Acute respiratory failure with hypoxia  question possible community-acquired pneumonia Patient presented with complaints of nausea with shortness of breath and cough.  Denied any recent sick contacts.    Initially hypoxic down to 86% with tachypnea requiring placement on BiPAP. Chest x-ray noted interstitial and patchy airspace opacity in the right lung base which may atelectasis versus infection in the appropriate clinical context. Influenza, COVID-19, and RSV screening negative.Venous blood gas noted pH 7.455, pCO2 41.9, pO2 28.  She was able to be quickly weaned off of BiPAP.   Lower suspicion for pneumonia, but not completely ruled out at this time -Admit to a telemetry bed -Continuous pulse oximetry with nasal cannula oxygen maintain O2 saturations greater than 92% -Incentive spirometry and flutter valve -Follow-up blood cultures -Follow-up complete respiratory virus panel -Check procalcitonin, urine Legionella, urine strep -Continue empiric antibiotics of Rocephin and azithromycin  Heart failure with preserved EF Pulmonary artery hypertension Suspect acute on chronic.  BNP was elevated at 420, and appears slightly lower than  previous BNP of 546.5  when patient was admitted for heart failure exacerbation in 08/2021. Echocardiogram noted EF to be 55-60% with indeterminate diastolic parameters, moderately elevated pulmonary artery systolic pressure, moderately dilated left and right atrium, moderate mitral valve regurgitation in 11/2021.  Patient has been given Lasix 40 mg IV x 1 dose. -Strict I&O's -Daily weights -Given additional Lasix 40 mg IV x 1 dose this evening.  Reevaluate in a.m. and determine need of further IV diuresis or adjustment to p.o. dose -  Check echocardiogram  Complicated urinary tract infection Patient has a chronic indwelling Foley with history of uterine prolapse for which she is followed [facility].  She followed up with them yesterday and had been prescribed nitrofurantoin for concern for possible urinary tract infection.  Urinalysis significant for small hemoglobin, small leukocytes, bacteria present, 11-20 WBCs, and 6-10 WBCs. -Continue empiric antibiotics of Rocephin IV  Leukocytosis Acute.  WBC elevated at 16.2.  Suspect secondary to complicated urinary tract infection -Recheck CBC tomorrow morning  Chronic atrial fibrillation on chronic anticoagulation Patient presented in atrial fibrillation with heart rates relatively controlled. -Continue Eliquis, Cardizem, metoprolol -Goal potassium at least 4 and magnesium at least 2  Essential hypertension Home medication regimen includes Cardizem 60 mg 3 times daily, metoprolol 100 mg twice daily, and furosemide 40 mg daily. -Continue metoprolol and Cardizem  History of CVA -Continue statin and Eliquis  CKD stage III -Continue to monitor kidney function with diuresis  History of breast cancer Continue outpatient follow-up with oncology  Hyperlipidemia -Continue atorvastatin  DVT prophylaxis: Eliquis Advance Care Planning:   Code Status: Full Code   Consults: None  Family Communication: Husband and son-in-law updated over  the phone  Severity of Illness: The appropriate patient status for this patient is INPATIENT. Inpatient status is judged to be reasonable and necessary in order to provide the required intensity of service to ensure the patient's safety. The patient's presenting symptoms, physical exam findings, and initial radiographic and laboratory data in the context of their chronic comorbidities is felt to place them at high risk for further clinical deterioration. Furthermore, it is not anticipated that the patient will be medically stable for discharge from the hospital within 2 midnights of admission.   * I certify that at the point of admission it is my clinical judgment that the patient will require inpatient hospital care spanning beyond 2 midnights from the point of admission due to high intensity of service, high risk for further deterioration and high frequency of surveillance required.*  Author: Norval Morton, MD 09/03/2022 10:47 AM  For on call review [email or web address].

## 2022-09-03 NOTE — Progress Notes (Signed)
RT called by RN to place pt on BIPAP per MD order. Pt was placed on 06/16/29%. Pt is tolerating well a this time. RT to continue to monitor.

## 2022-09-04 ENCOUNTER — Inpatient Hospital Stay (HOSPITAL_COMMUNITY): Payer: Medicare Other

## 2022-09-04 DIAGNOSIS — I5021 Acute systolic (congestive) heart failure: Secondary | ICD-10-CM | POA: Diagnosis not present

## 2022-09-04 DIAGNOSIS — J9601 Acute respiratory failure with hypoxia: Secondary | ICD-10-CM | POA: Diagnosis not present

## 2022-09-04 DIAGNOSIS — J189 Pneumonia, unspecified organism: Secondary | ICD-10-CM | POA: Diagnosis not present

## 2022-09-04 LAB — BASIC METABOLIC PANEL
Anion gap: 10 (ref 5–15)
BUN: 25 mg/dL — ABNORMAL HIGH (ref 8–23)
CO2: 27 mmol/L (ref 22–32)
Calcium: 8.9 mg/dL (ref 8.9–10.3)
Chloride: 100 mmol/L (ref 98–111)
Creatinine, Ser: 1.07 mg/dL — ABNORMAL HIGH (ref 0.44–1.00)
GFR, Estimated: 49 mL/min — ABNORMAL LOW (ref >60)
Glucose, Bld: 150 mg/dL — ABNORMAL HIGH (ref 70–99)
Potassium: 3.8 mmol/L (ref 3.5–5.1)
Sodium: 137 mmol/L (ref 135–145)

## 2022-09-04 LAB — ECHOCARDIOGRAM COMPLETE
AR max vel: 1.67 cm2
AV Area VTI: 1.63 cm2
AV Area mean vel: 1.49 cm2
AV Mean grad: 4 mmHg
AV Peak grad: 7.8 mmHg
Ao pk vel: 1.4 m/s
Height: 60 in
S' Lateral: 3.2 cm
Weight: 2190.49 oz

## 2022-09-04 LAB — RESPIRATORY PANEL BY PCR

## 2022-09-04 LAB — CBC
HCT: 32.8 % — ABNORMAL LOW (ref 36.0–46.0)
Hemoglobin: 11 g/dL — ABNORMAL LOW (ref 12.0–15.0)
MCH: 31.2 pg (ref 26.0–34.0)
MCHC: 33.5 g/dL (ref 30.0–36.0)
MCV: 92.9 fL (ref 80.0–100.0)
Platelets: 182 10*3/uL (ref 150–400)
RBC: 3.53 MIL/uL — ABNORMAL LOW (ref 3.87–5.11)
RDW: 13.2 % (ref 11.5–15.5)
WBC: 17.1 10*3/uL — ABNORMAL HIGH (ref 4.0–10.5)
nRBC: 0 % (ref 0.0–0.2)

## 2022-09-04 LAB — MAGNESIUM: Magnesium: 2 mg/dL (ref 1.7–2.4)

## 2022-09-04 LAB — STREP PNEUMONIAE URINARY ANTIGEN: Strep Pneumo Urinary Antigen: NEGATIVE

## 2022-09-04 MED ORDER — AZITHROMYCIN 250 MG PO TABS
500.0000 mg | ORAL_TABLET | Freq: Every day | ORAL | Status: AC
  Administered 2022-09-04 – 2022-09-08 (×5): 500 mg via ORAL
  Filled 2022-09-04 (×5): qty 2

## 2022-09-04 MED ORDER — FUROSEMIDE 40 MG PO TABS
40.0000 mg | ORAL_TABLET | Freq: Every day | ORAL | Status: DC
  Administered 2022-09-04 – 2022-09-06 (×3): 40 mg via ORAL
  Filled 2022-09-04 (×3): qty 1

## 2022-09-04 NOTE — Evaluation (Signed)
Physical Therapy Evaluation Patient Details Name: Adrienne [name] MRN: [id number] DOB: October 15, 1927 Today's Date: 09/04/2022  History of Present Illness  Judee Hennick is a 88 y.o. female who presents with complaints of SOB. PMH: hypertension, hyperlipidemia, atrial fibrillation, CAD, pulmonary hypertension, CKD, breast cancer s/p radiation, and GERD   Clinical Impression  Pt admitted with above. Pt with noted DOE of 4/4 with ambulation with RW of 150'. Despite being very winded pt's SpO2 >92% on 2LO2 via [place], HR 50-80s. Pt benefits from use of RW for energy conservation. Pt encouraged to do her incentive spirometer and acapella exercises. Acute PT to cont to follow to progress mobility.       Recommendations for follow up therapy are one component of a multi-disciplinary discharge planning process, led by the attending physician.  Recommendations may be updated based on patient status, additional functional criteria and insurance authorization.  Follow Up Recommendations No PT follow up      Assistance Recommended at Discharge Intermittent Supervision/Assistance  Patient can return home with the following  A little help with bathing/dressing/bathroom;Assist for transportation;Help with stairs or ramp for entrance    Equipment Recommendations None recommended by PT (has rollator)  Recommendations for Other Services       Functional Status Assessment Patient has had a recent decline in their functional status and demonstrates the ability to make significant improvements in function in a reasonable and predictable amount of time.     Precautions / Restrictions Precautions Precautions: Other (comment) Precaution Comments: droplet Restrictions Weight Bearing Restrictions: No      Mobility  Bed Mobility Overal bed mobility: Needs Assistance Bed Mobility: Supine to Sit     Supine to sit: Supervision     General bed mobility comments: increased time, HOB at 30  deg, DOE at 2/4, SpO2 at 95% on 2Lo2 via [place]    Transfers Overall transfer level: Needs assistance Equipment used: Rolling walker (2 wheels) Transfers: Sit to/from Stand Sit to Stand: Supervision           General transfer comment: verbal cues to push up from bed not pull up on walker and to reach back for bed/chair.    Ambulation/Gait Ambulation/Gait assistance: Min guard Gait Distance (Feet): 150 Feet Assistive device: Rolling walker (2 wheels) Gait Pattern/deviations: Step-through pattern, Decreased stride length Gait velocity: appropriate for age Gait velocity interpretation: <1.31 ft/sec, indicative of household ambulator   General Gait Details: DOE 4/4, SpO2 >92% on 2LO2 via [place]  Stairs            Wheelchair Mobility    Modified Rankin (Stroke Patients Only)       Balance Overall balance assessment: Mild deficits observed, not formally tested (for energy conservation pt benefits from RW for mobility)                                           Pertinent Vitals/Pain Pain Assessment Pain Assessment: No/denies pain    Home Living Family/patient expects to be discharged to:: Private residence Living Arrangements: Spouse/significant other Available Help at Discharge: Family;Available 24 hours/day Type of Home: House (townhome) Home Access: Stairs to enter Entrance Stairs-Rails: None Entrance Stairs-Number of Steps: 1   Home Layout: One level Home Equipment: Conservation officer, nature (2 wheels);Rollator (4 wheels);Shower seat Additional Comments: shower seat can't fit in the shower    Prior Function Prior Level of Function : Independent/Modified  Independent             Mobility Comments: used a rollator outside but no in the house, drove ADLs Comments: groceries were delivered, indep with dressing/bathing     Hand Dominance   Dominant Hand: Right    Extremity/Trunk Assessment   Upper Extremity Assessment Upper Extremity Assessment:  Generalized weakness    Lower Extremity Assessment Lower Extremity Assessment: Generalized weakness    Cervical / Trunk Assessment Cervical / Trunk Assessment: Kyphotic  Communication   Communication: No difficulties  Cognition Arousal/Alertness: Awake/alert Behavior During Therapy: WFL for tasks assessed/performed Overall Cognitive Status: Within Functional Limits for tasks assessed                                          General Comments General comments (skin integrity, edema, etc.): noted DOE with all activity, HR 50-80s, SpO2 >92% on 2lo2 via [place]    Exercises Other Exercises Other Exercises: went over the incentive spirometer and flutter valve   Assessment/Plan    PT Assessment Patient needs continued PT services  PT Problem List Decreased strength;Decreased activity tolerance;Decreased balance;Decreased mobility;Cardiopulmonary status limiting activity       PT Treatment Interventions DME instruction;Gait training;Stair training;Therapeutic activities;Functional mobility training;Therapeutic exercise;Balance training;Neuromuscular re-education    PT Goals (Current goals can be found in the Care Plan section)  Acute Rehab PT Goals Patient Stated Goal: home PT Goal Formulation: With patient/family Time For Goal Achievement: 09/18/22 Potential to Achieve Goals: Good    Frequency Min 3X/week     Co-evaluation               AM-PAC PT "6 Clicks" Mobility  Outcome Measure Help needed turning from your back to your side while in a flat bed without using bedrails?: None Help needed moving from lying on your back to sitting on the side of a flat bed without using bedrails?: None Help needed moving to and from a bed to a chair (including a wheelchair)?: A Little Help needed standing up from a chair using your arms (e.g., wheelchair or bedside chair)?: A Little Help needed to walk in hospital room?: A Little Help needed climbing 3-5 steps with a  railing? : A Little 6 Click Score: 20    End of Session Equipment Utilized During Treatment: Gait belt;Oxygen (2Lo2 via [place]) Activity Tolerance: Patient limited by fatigue Patient left: in bed;with call bell/phone within reach;with nursing/sitter in room;with bed alarm set (HOB at 60 deg) Nurse Communication: Mobility status PT Visit Diagnosis: Unsteadiness on feet (R26.81);Muscle weakness (generalized) (M62.81);Difficulty in walking, not elsewhere classified (R26.2)    Time: 1610-9604 PT Time Calculation (min) (ACUTE ONLY): 34 min   Charges:   PT Evaluation $PT Eval Moderate Complexity: 1 Mod PT Treatments $Gait Training: 8-22 mins        Kittie Plater, PT, DPT Acute Rehabilitation Services Secure chat preferred Office #: [phone]   Berline Lopes 09/04/2022, 11:34 AM

## 2022-09-04 NOTE — Progress Notes (Addendum)
PROGRESS NOTE    Adrienne [name]  XIP:[id number] DOB: 1927/10/11 DOA: 09/03/2022 PCP: Kathyrn Lass, MD   Brief Narrative:  Adrienne [name] is a 88 y.o. female with medical history significant of hypertension, hyperlipidemia, atrial fibrillation, CAD, pulmonary hypertension, CKD, breast cancer s/p radiation, and GERD presents with complaints of acute onset of shortness of breath. In route with EMS patient was noted to be tachypneic and wheezing and all lung fields.  Patient received DuoNeb breathing treatment and 125 mg of Solu-Medrol IV.   Upon admission into the emergency department patient was noted to be afebrile with heart rates 83-1 02, respirations 18-33, blood pressures 110/94-157/72, and O2 saturations as low as 86% with improvement on BiPAP greater than 92%.  Labs significant for WBC 16.2, BUN 21, creatinine 1.08, high-sensitivity troponin 9, and BNP 420.7.  Chest x-ray noted subtle interstitial and patchy airspace opacity of the right lung base concerning for atelectasis versus infection.  Respiratory virus panel was pending.  Patient has been given full dose aspirin, Lasix 40 mg IV, magnesium 1 g IV, potassium chloride 40 meq p.o., Rocephin, and azithromycin.  She was able to be weaned off BiPAP to room air.  Assessment & Plan:   Acute respiratory failure with hypoxia 2/2 possible community-acquired pneumonia: -Initially hypoxic down to 86% with tachypnea requiring placement on BiPAP. Chest x-ray noted interstitial and patchy airspace opacity in the right lung base which may atelectasis versus infection- - Influenza, COVID-19, and RSV screening negative.respiratory panel: Negative.  Strep pneumonia negative.  Legionella pending  -Received Rocephin and azithromycin. -Will try to wean off of oxygen as tolerated -Remained afebrile with leukocytosis trending up.  PCT: <0.10.   Heart failure with preserved EF Pulmonary artery hypertension -Does not appears to be in  fluid overload. Her weight is at baseline. -BNP was elevated at 420.Patient has been given Lasix 40 mg IV x 2 dose. Reviewed ECHO-EF: 50 to 55% with moderate LVH of basal septal segment.  Left ventricle demonstrated global hypokinesis.  Moderate to severe MR..  Followed by cardiology outpatient. -Resume home lasix.  Strict INO's and daily weight.   Complicated urinary tract infection -Patient has a chronic indwelling Foley with history of uterine prolapse -followed by [facility] -Seen by urology on 12/23 and was started on nitrofurantoin for concern for possible urinary tract infection.  Urinalysis significant for small hemoglobin, small leukocytes, bacteria present, 11-20 WBCs, and 6-10 WBCs. -Continue empiric antibiotics of Rocephin IV.  Await urine culture result  CKD stage IIIb: Creatinine at baseline.  Continue to monitor   Leukocytosis -Likely in the setting of UTI.  She is afebrile.   Chronic atrial fibrillation on chronic anticoagulation Patient presented in atrial fibrillation with heart rates relatively controlled. -Continue Eliquis, Cardizem, metoprolol -Goal potassium at least 4 and magnesium at least 2   Essential hypertension -Continue metoprolol and Cardizem   History of CVA -Continue statin and Eliquis   CKD stage III -Continue to monitor kidney function with diuresis   History of breast cancer -Continue outpatient follow-up with oncology   Hyperlipidemia -Continue atorvastatin  GERD: Continue PPI  DVT prophylaxis: Eliquis Code Status: Full code Family Communication: Patient's husband present at bedside.  Plan of care discussed with patient in length and she verbalized understanding and agreed with it. Disposition Plan: To be determined  Consultants:  None  Procedures:  None  Antimicrobials:  Rocephin Azithromycin  Status is: Inpatient    Subjective: Patient seen and examined.  Patient has been at the  bedside.  Patient reports feels  somewhat better however has shortness of breath with mild exertion.  No chest pain.  Has chronic bilateral edema.  No orthopnea, PND.  Denies any fever or chills.  Objective: Vitals:   09/04/22 0010 09/04/22 0500 09/04/22 0538 09/04/22 0735  BP: 139/87  (!) 147/63 129/64  Pulse: 73  69 (!) 59  Resp: 17   17  Temp: 98.2 F (36.8 C)   98 F (36.7 C)  TempSrc: Oral   Oral  SpO2: 97%  98% 98%  Weight:  62.1 kg    Height:        Intake/Output Summary (Last 24 hours) at 09/04/2022 1426 Last data filed at 09/04/2022 1407 Gross per 24 hour  Intake 460 ml  Output 3250 ml  Net -2790 ml   Filed Weights   09/03/22 0840 09/04/22 0500  Weight: 61.3 kg 62.1 kg    Examination:  General exam: Appears calm and comfortable, elderly female, on nasal cannula, husband at the bedside, communicating well Respiratory system: Clear to auscultation. Respiratory effort normal. Cardiovascular system: S1 & S2 heard, RRR. No JVD, murmurs, rubs, gallops or clicks. No pedal edema. Gastrointestinal system: Abdomen is nondistended, soft and nontender. No organomegaly or masses felt. Normal bowel sounds heard.  Foley catheter in placed. Central nervous system: Alert and oriented. No focal neurological deficits. Extremities: Symmetric 5 x 5 power. Skin: No rashes, lesions or ulcers Psychiatry: Judgement and insight appear normal. Mood & affect appropriate.    Data Reviewed: I have personally reviewed following labs and imaging studies  CBC: Recent Labs  Lab 09/03/22 0850 09/03/22 0856 09/04/22 0232  WBC 16.2*  --  17.1*  NEUTROABS 13.1*  --   --   HGB 13.1 13.6 11.0*  HCT 39.5 40.0 32.8*  MCV 93.4  --  92.9  PLT 219  --  631   Basic Metabolic Panel: Recent Labs  Lab 09/03/22 0850 09/03/22 0856 09/04/22 0232 09/04/22 0836  NA 137 136 137  --   K 3.5 3.5 3.8  --   CL 99  --  100  --   CO2 28  --  27  --   GLUCOSE 149*  --  150*  --   BUN 21  --  25*  --   CREATININE 1.08*  --  1.07*  --    CALCIUM 9.4  --  8.9  --   MG 1.7  --   --  2.0   GFR: Estimated Creatinine Clearance: 27.6 mL/min (A) (by C-G formula based on SCr of 1.07 mg/dL (H)). Liver Function Tests: Recent Labs  Lab 09/03/22 0850  AST 25  ALT 17  ALKPHOS 85  BILITOT 0.8  PROT 7.3  ALBUMIN 3.9   No results for input(s): "LIPASE", "AMYLASE" in the last 168 hours. No results for input(s): "AMMONIA" in the last 168 hours. Coagulation Profile: No results for input(s): "INR", "PROTIME" in the last 168 hours. Cardiac Enzymes: No results for input(s): "CKTOTAL", "CKMB", "CKMBINDEX", "TROPONINI" in the last 168 hours. BNP (last 3 results) No results for input(s): "PROBNP" in the last 8760 hours. HbA1C: No results for input(s): "HGBA1C" in the last 72 hours. CBG: No results for input(s): "GLUCAP" in the last 168 hours. Lipid Profile: No results for input(s): "CHOL", "HDL", "LDLCALC", "TRIG", "CHOLHDL", "LDLDIRECT" in the last 72 hours. Thyroid Function Tests: Recent Labs    09/03/22 1135  TSH 3.242   Anemia Panel: No results for input(s): "VITAMINB12", "FOLATE", "FERRITIN", "TIBC", "  IRON", "RETICCTPCT" in the last 72 hours. Sepsis Labs: Recent Labs  Lab 09/03/22 0859  PROCALCITON <0.10    Recent Results (from the past 240 hour(s))  Resp panel by RT-PCR (RSV, Flu A&B, Covid) Anterior Nasal Swab     Status: None   Collection Time: 09/03/22  8:50 AM   Specimen: Anterior Nasal Swab  Result Value Ref Range Status   SARS Coronavirus 2 by RT PCR NEGATIVE NEGATIVE Final    Comment: (NOTE) SARS-CoV-2 target nucleic acids are NOT DETECTED.  The SARS-CoV-2 RNA is generally detectable in upper respiratory specimens during the acute phase of infection. The lowest concentration of SARS-CoV-2 viral copies this assay can detect is 138 copies/mL. A negative result does not preclude SARS-Cov-2 infection and should not be used as the sole basis for treatment or other patient management decisions. A negative  result may occur with  improper specimen collection/handling, submission of specimen other than nasopharyngeal swab, presence of viral mutation(s) within the areas targeted by this assay, and inadequate number of viral copies(<138 copies/mL). A negative result must be combined with clinical observations, patient history, and epidemiological information. The expected result is Negative.  Fact Sheet for Patients:  EntrepreneurPulse.com.au  Fact Sheet for Healthcare Providers:  IncredibleEmployment.be  This test is no t yet approved or cleared by the [place] FDA and  has been authorized for detection and/or diagnosis of SARS-CoV-2 by FDA under an Emergency Use Authorization (EUA). This EUA will remain  in effect (meaning this test can be used) for the duration of the COVID-19 declaration under Section 564(b)(1) of the Act, 21 U.S.C.section 360bbb-3(b)(1), unless the authorization is terminated  or revoked sooner.       Influenza A by PCR NEGATIVE NEGATIVE Final   Influenza B by PCR NEGATIVE NEGATIVE Final    Comment: (NOTE) The Xpert Xpress SARS-CoV-2/FLU/RSV plus assay is intended as an aid in the diagnosis of influenza from Nasopharyngeal swab specimens and should not be used as a sole basis for treatment. Nasal washings and aspirates are unacceptable for Xpert Xpress SARS-CoV-2/FLU/RSV testing.  Fact Sheet for Patients: EntrepreneurPulse.com.au  Fact Sheet for Healthcare Providers: IncredibleEmployment.be  This test is not yet approved or cleared by the [place] FDA and has been authorized for detection and/or diagnosis of SARS-CoV-2 by FDA under an Emergency Use Authorization (EUA). This EUA will remain in effect (meaning this test can be used) for the duration of the COVID-19 declaration under Section 564(b)(1) of the Act, 21 U.S.C. section 360bbb-3(b)(1), unless the authorization is  terminated or revoked.     Resp Syncytial Virus by PCR NEGATIVE NEGATIVE Final    Comment: (NOTE) Fact Sheet for Patients: EntrepreneurPulse.com.au  Fact Sheet for Healthcare Providers: IncredibleEmployment.be  This test is not yet approved or cleared by the [place] FDA and has been authorized for detection and/or diagnosis of SARS-CoV-2 by FDA under an Emergency Use Authorization (EUA). This EUA will remain in effect (meaning this test can be used) for the duration of the COVID-19 declaration under Section 564(b)(1) of the Act, 21 U.S.C. section 360bbb-3(b)(1), unless the authorization is terminated or revoked.  Performed at [facility], [place] [address], [place], [place] [address]   Blood culture (routine x 2)     Status: None (Preliminary result)   Collection Time: 09/03/22 10:15 AM   Specimen: BLOOD LEFT HAND  Result Value Ref Range Status   Specimen Description BLOOD LEFT HAND  Final   Special Requests   Final  BOTTLES DRAWN AEROBIC AND ANAEROBIC Blood Culture adequate volume   Culture   Final    NO GROWTH < 24 HOURS Performed at [facility], [place] [address], [place], [place] [address]    Report Status PENDING  Incomplete  Blood culture (routine x 2)     Status: None (Preliminary result)   Collection Time: 09/03/22 10:25 AM   Specimen: BLOOD RIGHT HAND  Result Value Ref Range Status   Specimen Description BLOOD RIGHT HAND  Final   Special Requests   Final    BOTTLES DRAWN AEROBIC AND ANAEROBIC Blood Culture adequate volume   Culture   Final    NO GROWTH < 24 HOURS Performed at [facility], [place] [address], [place], [place] [address]    Report Status PENDING  Incomplete  Respiratory (~20 pathogens) panel by PCR     Status: None   Collection Time: 09/04/22  6:00 AM   Specimen: Nasopharyngeal Swab; Respiratory  Result Value Ref Range Status   Adenovirus NOT DETECTED NOT DETECTED Final   Coronavirus 229E  NOT DETECTED NOT DETECTED Final    Comment: (NOTE) The Coronavirus on the Respiratory Panel, DOES NOT test for the novel  Coronavirus (2019 nCoV)    Coronavirus HKU1 NOT DETECTED NOT DETECTED Final   Coronavirus NL63 NOT DETECTED NOT DETECTED Final   Coronavirus OC43 NOT DETECTED NOT DETECTED Final   Metapneumovirus NOT DETECTED NOT DETECTED Final   Rhinovirus / Enterovirus NOT DETECTED NOT DETECTED Final   Influenza A NOT DETECTED NOT DETECTED Final   Influenza B NOT DETECTED NOT DETECTED Final   Parainfluenza Virus 1 NOT DETECTED NOT DETECTED Final   Parainfluenza Virus 2 NOT DETECTED NOT DETECTED Final   Parainfluenza Virus 3 NOT DETECTED NOT DETECTED Final   Parainfluenza Virus 4 NOT DETECTED NOT DETECTED Final   Respiratory Syncytial Virus NOT DETECTED NOT DETECTED Final   Bordetella pertussis NOT DETECTED NOT DETECTED Final   Bordetella Parapertussis NOT DETECTED NOT DETECTED Final   Chlamydophila pneumoniae NOT DETECTED NOT DETECTED Final   Mycoplasma pneumoniae NOT DETECTED NOT DETECTED Final    Comment: Performed at [facility], [place]. [address] [place], [place] [address]      Radiology Studies: ECHOCARDIOGRAM COMPLETE  Result Date: 09/04/2022    ECHOCARDIOGRAM REPORT   Patient Name:   Adrienne [name] Date of Exam: 09/04/2022 Medical Rec #:  [id number]                Height:       60.0 in Accession #:    [id number]               Weight:       136.9 lb Date of Birth:  1928/04/10                BSA:          1.589 m Patient Age:    45 years                 BP:           129/64 mmHg Patient Gender: F                        HR:           62 bpm. Exam Location:  Inpatient Procedure: 2D Echo, Cardiac Doppler and Color Doppler Indications:    CHF-acute systolic  History:  Patient has prior history of Echocardiogram examinations, most                 recent 12/09/2021. CAD, Pulmonary HTN, Arrythmias:Atrial                 Fibrillation; Signs/Symptoms:Shortness of  Breath. CKD.  Sonographer:    Clayton Lefort RDCS (AE) Referring Phys: [id number] RONDELL A SMITH IMPRESSIONS  1. Left ventricular ejection fraction, by estimation, is 50 to 55%. The left ventricle has low normal function. The left ventricle demonstrates global hypokinesis. There is moderate left ventricular hypertrophy of the basal-septal segment. Left ventricular diastolic function could not be evaluated.  2. Right ventricular systolic function is mildly reduced. The right ventricular size is moderately enlarged.  3. Left atrial size was mildly dilated.  4. Right atrial size was mild to moderately dilated.  5. The mitral valve is degenerative. Moderate to severe mitral valve regurgitation. No evidence of mitral stenosis.  6. The aortic valve is tricuspid. Aortic valve regurgitation is not visualized. Aortic valve sclerosis/calcification is present, without any evidence of aortic stenosis. Comparison(s): No significant change from prior study. FINDINGS  Left Ventricle: Left ventricular ejection fraction, by estimation, is 50 to 55%. The left ventricle has low normal function. The left ventricle demonstrates global hypokinesis. The left ventricular internal cavity size was normal in size. There is moderate left ventricular hypertrophy of the basal-septal segment. Left ventricular diastolic function could not be evaluated due to atrial fibrillation. Left ventricular diastolic function could not be evaluated. Right Ventricle: The right ventricular size is moderately enlarged. No increase in right ventricular wall thickness. Right ventricular systolic function is mildly reduced. Left Atrium: Left atrial size was mildly dilated. Right Atrium: Right atrial size was mild to moderately dilated. Pericardium: There is no evidence of pericardial effusion. Mitral Valve: The mitral valve is degenerative in appearance. Mild to moderate mitral annular calcification. Moderate to severe mitral valve regurgitation. No evidence of mitral  valve stenosis. Tricuspid Valve: The tricuspid valve is grossly normal. Tricuspid valve regurgitation is mild . No evidence of tricuspid stenosis. Aortic Valve: The aortic valve is tricuspid. Aortic valve regurgitation is not visualized. Aortic valve sclerosis/calcification is present, without any evidence of aortic stenosis. Aortic valve mean gradient measures 4.0 mmHg. Aortic valve peak gradient measures 7.8 mmHg. Aortic valve area, by VTI measures 1.63 cm. Pulmonic Valve: The pulmonic valve was grossly normal. Pulmonic valve regurgitation is trivial. No evidence of pulmonic stenosis. Aorta: The aortic root and ascending aorta are structurally normal, with no evidence of dilitation. Venous: The inferior vena cava was not well visualized. IAS/Shunts: The atrial septum is grossly normal.  LEFT VENTRICLE PLAX 2D LVIDd:         4.60 cm LVIDs:         3.20 cm LV PW:         1.10 cm LV IVS:        1.40 cm LVOT diam:     1.90 cm LV SV:         58 LV SV Index:   37 LVOT Area:     2.84 cm  RIGHT VENTRICLE RV Basal diam:  4.40 cm RV Mid diam:    4.70 cm RV S prime:     8.49 cm/s TAPSE (M-mode): 1.6 cm LEFT ATRIUM             Index        RIGHT ATRIUM           Index  LA diam:        4.30 cm 2.71 cm/m   RA Area:     24.00 cm LA Vol (A2C):   61.4 ml 38.64 ml/m  RA Volume:   73.20 ml  46.07 ml/m LA Vol (A4C):   52.7 ml 33.17 ml/m LA Biplane Vol: 58.4 ml 36.76 ml/m  AORTIC VALVE AV Area (Vmax):    1.67 cm AV Area (Vmean):   1.49 cm AV Area (VTI):     1.63 cm AV Vmax:           140.00 cm/s AV Vmean:          98.100 cm/s AV VTI:            0.358 m AV Peak Grad:      7.8 mmHg AV Mean Grad:      4.0 mmHg LVOT Vmax:         82.50 cm/s LVOT Vmean:        51.700 cm/s LVOT VTI:          0.206 m LVOT/AV VTI ratio: 0.58  AORTA Ao Root diam: 3.10 cm Ao Asc diam:  3.30 cm TRICUSPID VALVE TR Peak grad:   36.5 mmHg TR Vmax:        302.00 cm/s  SHUNTS Systemic VTI:  0.21 m Systemic Diam: 1.90 cm Eleonore Chiquito MD Electronically  signed by Eleonore Chiquito MD Signature Date/Time: 09/04/2022/2:03:01 PM    Final    DG Chest Port 1 View  Result Date: 09/03/2022 CLINICAL DATA:  Dyspnea. History of CAD, pulmonary hypertension, and left breast cancer status post lumpectomy. EXAM: PORTABLE CHEST 1 VIEW COMPARISON:  Chest radiograph 08/17/2021 and earlier; CT chest 06/29/2020 FINDINGS: Of note, patient is mildly rotated on this portable examination. Borderline enlarged cardiac silhouette, stable. Aortic calcifications. Subtle interstitial and patchy airspace opacity of the right lung base. Left lung is clear. No pleural effusion or pneumothorax. Diffuse osteopenia. Surgical clips overlie the left chest wall and left axilla. IMPRESSION: Subtle interstitial and patchy airspace opacity of the right lung base, which may reflect atelectasis versus infection in the appropriate clinical context. Electronically Signed   By: Ileana Roup M.D.   On: 09/03/2022 09:38    Scheduled Meds:  acidophilus  1 capsule Oral Daily   apixaban  5 mg Oral BID   atorvastatin  10 mg Oral Daily   Chlorhexidine Gluconate Cloth  6 each Topical Daily   diltiazem  60 mg Oral TID   furosemide  40 mg Oral Daily   metoprolol tartrate  100 mg Oral BID   pantoprazole  40 mg Oral Daily   potassium chloride SA  20 mEq Oral Daily   Continuous Infusions:  cefTRIAXone (ROCEPHIN)  IV 2 g (09/04/22 0930)     LOS: 1 day   Time spent: 35 minutes   Makena Murdock Loann Quill, MD [facility]  If 7PM-7AM, please contact night-coverage [email or web address] 09/04/2022, 2:26 PM

## 2022-09-04 NOTE — Progress Notes (Signed)
  Echocardiogram 2D Echocardiogram has been performed.  Adrienne [name] 09/04/2022, 12:13 PM

## 2022-09-05 DIAGNOSIS — J189 Pneumonia, unspecified organism: Secondary | ICD-10-CM | POA: Diagnosis not present

## 2022-09-05 DIAGNOSIS — J9601 Acute respiratory failure with hypoxia: Secondary | ICD-10-CM | POA: Diagnosis not present

## 2022-09-05 DIAGNOSIS — I5033 Acute on chronic diastolic (congestive) heart failure: Secondary | ICD-10-CM | POA: Diagnosis not present

## 2022-09-05 LAB — BASIC METABOLIC PANEL
Anion gap: 10 (ref 5–15)
BUN: 32 mg/dL — ABNORMAL HIGH (ref 8–23)
CO2: 26 mmol/L (ref 22–32)
Calcium: 8.9 mg/dL (ref 8.9–10.3)
Chloride: 101 mmol/L (ref 98–111)
Creatinine, Ser: 1.05 mg/dL — ABNORMAL HIGH (ref 0.44–1.00)
GFR, Estimated: 50 mL/min — ABNORMAL LOW (ref >60)
Glucose, Bld: 104 mg/dL — ABNORMAL HIGH (ref 70–99)
Potassium: 4.5 mmol/L (ref 3.5–5.1)
Sodium: 137 mmol/L (ref 135–145)

## 2022-09-05 LAB — CBC
HCT: 35.9 % — ABNORMAL LOW (ref 36.0–46.0)
Hemoglobin: 11.7 g/dL — ABNORMAL LOW (ref 12.0–15.0)
MCH: 30.7 pg (ref 26.0–34.0)
MCHC: 32.6 g/dL (ref 30.0–36.0)
MCV: 94.2 fL (ref 80.0–100.0)
Platelets: 206 10*3/uL (ref 150–400)
RBC: 3.81 MIL/uL — ABNORMAL LOW (ref 3.87–5.11)
RDW: 13.7 % (ref 11.5–15.5)
WBC: 19.4 10*3/uL — ABNORMAL HIGH (ref 4.0–10.5)
nRBC: 0 % (ref 0.0–0.2)

## 2022-09-05 LAB — MAGNESIUM: Magnesium: 2.1 mg/dL (ref 1.7–2.4)

## 2022-09-05 MED ORDER — HYDROXYZINE HCL 25 MG PO TABS
25.0000 mg | ORAL_TABLET | Freq: Once | ORAL | Status: AC
  Administered 2022-09-05: 25 mg via ORAL
  Filled 2022-09-05 (×2): qty 1

## 2022-09-05 MED ORDER — ALPRAZOLAM 0.25 MG PO TABS: 0.2500 mg | ORAL_TABLET | Freq: Once | ORAL | Status: DC

## 2022-09-05 NOTE — Progress Notes (Signed)
Pt refused CPAP therapy, resting comfortably on nasal cannula. RN made aware.

## 2022-09-05 NOTE — Plan of Care (Signed)
  Problem: Nutrition: Goal: Adequate nutrition will be maintained Outcome: Progressing   Problem: Elimination: Goal: Will not experience complications related to bowel motility Outcome: Progressing   Problem: Safety: Goal: Ability to remain free from injury will improve Outcome: Progressing   

## 2022-09-05 NOTE — Progress Notes (Signed)
BiPAP therapy refused for tonight.

## 2022-09-05 NOTE — Progress Notes (Signed)
PROGRESS NOTE    Adrienne [name]  GNF:[id number] DOB: August 16, 1928 DOA: 09/03/2022 PCP: Kathyrn Lass, MD   Brief Narrative:  Adrienne [name] is a 88 y.o. female with medical history significant of hypertension, hyperlipidemia, atrial fibrillation, CAD, pulmonary hypertension, CKD, breast cancer s/p radiation, and GERD presents with complaints of acute onset of shortness of breath. In route with EMS patient was noted to be tachypneic and wheezing and all lung fields.  Patient received DuoNeb breathing treatment and 125 mg of Solu-Medrol IV.   Upon admission into the emergency department patient was noted to be afebrile with heart rates 83-1 02, respirations 18-33, blood pressures 110/94-157/72, and O2 saturations as low as 86% with improvement on BiPAP greater than 92%.  Labs significant for WBC 16.2, BUN 21, creatinine 1.08, high-sensitivity troponin 9, and BNP 420.7.  Chest x-ray noted subtle interstitial and patchy airspace opacity of the right lung base concerning for atelectasis versus infection.  Respiratory virus panel was pending.  Patient has been given full dose aspirin, Lasix 40 mg IV, magnesium 1 g IV, potassium chloride 40 meq p.o., Rocephin, and azithromycin.  She was able to be weaned off BiPAP to room air.  Assessment & Plan:   Acute respiratory failure with hypoxia 2/2 possible community-acquired pneumonia: -Initially hypoxic down to 86% with tachypnea requiring placement on BiPAP. Chest x-ray noted interstitial and patchy airspace opacity in the right lung base which may atelectasis versus infection- - Influenza, COVID-19, and RSV screening negative.respiratory panel: Negative.  Strep pneumonia negative.  Legionella pending  -on Rocephin and azithromycin. -Will try to wean off of oxygen as tolerated -Remained afebrile with leukocytosis trending up.  PCT: <0.10.   Heart failure with preserved EF Pulmonary artery hypertension -Does not appears to be in fluid  overload. Her weight is at baseline. -BNP was elevated at 420 (below baseline).Patient has been given Lasix 40 mg IV x 2 dose. Reviewed ECHO-EF: 50 to 55% with moderate LVH of basal septal segment.  Left ventricle demonstrated global hypokinesis.  Moderate to severe MR..  Followed by cardiology outpatient. -cont. home lasix.  Strict INO's and daily weight.   Complicated urinary tract infection -Patient has a chronic indwelling Foley with history of uterine prolapse -followed by [facility] -Seen by urology on 12/23 and was started on nitrofurantoin for concern for possible urinary tract infection.  UA +ve for infection -Continue empiric antibiotics of Rocephin IV.  Urine culture positive for Pseudomonas aeruginosa.  Pending susceptibility  CKD stage IIIb: Creatinine at baseline.  Continue to monitor   Leukocytosis -Likely in the setting of UTI.  She is afebrile.   Chronic atrial fibrillation on chronic anticoagulation Patient presented in atrial fibrillation with heart rates relatively controlled. -Continue Eliquis, Cardizem, metoprolol -Goal potassium at least 4 and magnesium at least 2   Essential hypertension -Continue metoprolol and Cardizem   History of CVA -Continue statin and Eliquis   CKD stage III -Continue to monitor kidney function with diuresis   History of breast cancer -Continue outpatient follow-up with oncology   Hyperlipidemia -Continue atorvastatin  GERD: Continue PPI  DVT prophylaxis: Eliquis Code Status: Full code Family Communication: Patient's husband present at bedside.  Plan of care discussed with patient in length and she verbalized understanding and agreed with it. Disposition Plan: To be determined  Consultants:  None  Procedures:  None  Antimicrobials:  Rocephin Azithromycin  Status is: Inpatient    Subjective: Patient seen and examined.  Patient's husband at the bedside.  Reports that  she is not feeling well and continues to  have difficulty breathing.  No chest pain, orthopnea, PND.  No fever, chills.  Remained afebrile overnight.  Concerned about her elevated blood pressure.  Objective: Vitals:   09/04/22 1545 09/04/22 2032 09/05/22 0608 09/05/22 0718  BP: (!) 151/71 (!) 155/74 (!) 161/64 (!) 165/78  Pulse: 66 72 72 94  Resp: '18 18 18 16  '$ Temp: 97.7 F (36.5 C) 97.9 F (36.6 C) 98 F (36.7 C) 97.9 F (36.6 C)  TempSrc:  Oral Oral Oral  SpO2: 98% 97% 99% 99%  Weight:   60.5 kg   Height:        Intake/Output Summary (Last 24 hours) at 09/05/2022 1301 Last data filed at 09/05/2022 0022 Gross per 24 hour  Intake 820 ml  Output 3000 ml  Net -2180 ml    Filed Weights   09/03/22 0840 09/04/22 0500 09/05/22 5621  Weight: 61.3 kg 62.1 kg 60.5 kg    Examination:  General exam: Appears calm and comfortable, elderly female, on nasal cannula, husband at the bedside, communicating well Respiratory system: Clear to auscultation. Respiratory effort normal. Cardiovascular system: S1 & S2 heard, RRR. No JVD, murmurs, rubs, gallops or clicks. No pedal edema. Gastrointestinal system: Abdomen is nondistended, soft and nontender. No organomegaly or masses felt. Normal bowel sounds heard.  Foley catheter in placed. Central nervous system: Alert and oriented. No focal neurological deficits. Extremities: Symmetric 5 x 5 power. Skin: No rashes, lesions or ulcers Psychiatry: Judgement and insight appear normal. Mood & affect appropriate.    Data Reviewed: I have personally reviewed following labs and imaging studies  CBC: Recent Labs  Lab 09/03/22 0850 09/03/22 0856 09/04/22 0232 09/05/22 0242  WBC 16.2*  --  17.1* 19.4*  NEUTROABS 13.1*  --   --   --   HGB 13.1 13.6 11.0* 11.7*  HCT 39.5 40.0 32.8* 35.9*  MCV 93.4  --  92.9 94.2  PLT 219  --  182 308    Basic Metabolic Panel: Recent Labs  Lab 09/03/22 0850 09/03/22 0856 09/04/22 0232 09/04/22 0836 09/05/22 0242  NA 137 136 137  --  137  K 3.5  3.5 3.8  --  4.5  CL 99  --  100  --  101  CO2 28  --  27  --  26  GLUCOSE 149*  --  150*  --  104*  BUN 21  --  25*  --  32*  CREATININE 1.08*  --  1.07*  --  1.05*  CALCIUM 9.4  --  8.9  --  8.9  MG 1.7  --   --  2.0 2.1    GFR: Estimated Creatinine Clearance: 27.8 mL/min (A) (by C-G formula based on SCr of 1.05 mg/dL (H)). Liver Function Tests: Recent Labs  Lab 09/03/22 0850  AST 25  ALT 17  ALKPHOS 85  BILITOT 0.8  PROT 7.3  ALBUMIN 3.9    No results for input(s): "LIPASE", "AMYLASE" in the last 168 hours. No results for input(s): "AMMONIA" in the last 168 hours. Coagulation Profile: No results for input(s): "INR", "PROTIME" in the last 168 hours. Cardiac Enzymes: No results for input(s): "CKTOTAL", "CKMB", "CKMBINDEX", "TROPONINI" in the last 168 hours. BNP (last 3 results) No results for input(s): "PROBNP" in the last 8760 hours. HbA1C: No results for input(s): "HGBA1C" in the last 72 hours. CBG: No results for input(s): "GLUCAP" in the last 168 hours. Lipid Profile: No results for input(s): "CHOL", "  HDL", "LDLCALC", "TRIG", "CHOLHDL", "LDLDIRECT" in the last 72 hours. Thyroid Function Tests: Recent Labs    09/03/22 1135  TSH 3.242    Anemia Panel: No results for input(s): "VITAMINB12", "FOLATE", "FERRITIN", "TIBC", "IRON", "RETICCTPCT" in the last 72 hours. Sepsis Labs: Recent Labs  Lab 09/03/22 0859  PROCALCITON <0.10     Recent Results (from the past 240 hour(s))  Resp panel by RT-PCR (RSV, Flu A&B, Covid) Anterior Nasal Swab     Status: None   Collection Time: 09/03/22  8:50 AM   Specimen: Anterior Nasal Swab  Result Value Ref Range Status   SARS Coronavirus 2 by RT PCR NEGATIVE NEGATIVE Final    Comment: (NOTE) SARS-CoV-2 target nucleic acids are NOT DETECTED.  The SARS-CoV-2 RNA is generally detectable in upper respiratory specimens during the acute phase of infection. The lowest concentration of SARS-CoV-2 viral copies this assay can  detect is 138 copies/mL. A negative result does not preclude SARS-Cov-2 infection and should not be used as the sole basis for treatment or other patient management decisions. A negative result may occur with  improper specimen collection/handling, submission of specimen other than nasopharyngeal swab, presence of viral mutation(s) within the areas targeted by this assay, and inadequate number of viral copies(<138 copies/mL). A negative result must be combined with clinical observations, patient history, and epidemiological information. The expected result is Negative.  Fact Sheet for Patients:  EntrepreneurPulse.com.au  Fact Sheet for Healthcare Providers:  IncredibleEmployment.be  This test is no t yet approved or cleared by the [place] FDA and  has been authorized for detection and/or diagnosis of SARS-CoV-2 by FDA under an Emergency Use Authorization (EUA). This EUA will remain  in effect (meaning this test can be used) for the duration of the COVID-19 declaration under Section 564(b)(1) of the Act, 21 U.S.C.section 360bbb-3(b)(1), unless the authorization is terminated  or revoked sooner.       Influenza A by PCR NEGATIVE NEGATIVE Final   Influenza B by PCR NEGATIVE NEGATIVE Final    Comment: (NOTE) The Xpert Xpress SARS-CoV-2/FLU/RSV plus assay is intended as an aid in the diagnosis of influenza from Nasopharyngeal swab specimens and should not be used as a sole basis for treatment. Nasal washings and aspirates are unacceptable for Xpert Xpress SARS-CoV-2/FLU/RSV testing.  Fact Sheet for Patients: EntrepreneurPulse.com.au  Fact Sheet for Healthcare Providers: IncredibleEmployment.be  This test is not yet approved or cleared by the [place] FDA and has been authorized for detection and/or diagnosis of SARS-CoV-2 by FDA under an Emergency Use Authorization (EUA). This EUA will remain in  effect (meaning this test can be used) for the duration of the COVID-19 declaration under Section 564(b)(1) of the Act, 21 U.S.C. section 360bbb-3(b)(1), unless the authorization is terminated or revoked.     Resp Syncytial Virus by PCR NEGATIVE NEGATIVE Final    Comment: (NOTE) Fact Sheet for Patients: EntrepreneurPulse.com.au  Fact Sheet for Healthcare Providers: IncredibleEmployment.be  This test is not yet approved or cleared by the [place] FDA and has been authorized for detection and/or diagnosis of SARS-CoV-2 by FDA under an Emergency Use Authorization (EUA). This EUA will remain in effect (meaning this test can be used) for the duration of the COVID-19 declaration under Section 564(b)(1) of the Act, 21 U.S.C. section 360bbb-3(b)(1), unless the authorization is terminated or revoked.  Performed at [facility], [place] [address], [place], [place] [address]   Urine Culture     Status: Abnormal (Preliminary result)   Collection  Time: 09/03/22 10:02 AM   Specimen: Urine, Clean Catch  Result Value Ref Range Status   Specimen Description URINE, CLEAN CATCH  Final   Special Requests NONE  Final   Culture (A)  Final    60,000 COLONIES/mL PSEUDOMONAS AERUGINOSA CULTURE REINCUBATED FOR BETTER GROWTH Performed at [facility], [address], [place], [place] [address]    Report Status PENDING  Incomplete  Blood culture (routine x 2)     Status: None (Preliminary result)   Collection Time: 09/03/22 10:15 AM   Specimen: BLOOD LEFT HAND  Result Value Ref Range Status   Specimen Description BLOOD LEFT HAND  Final   Special Requests   Final    BOTTLES DRAWN AEROBIC AND ANAEROBIC Blood Culture adequate volume   Culture   Final    NO GROWTH 2 DAYS Performed at [facility], [place] [address] [place], [place] [address]    Report Status PENDING  Incomplete  Blood culture (routine x 2)     Status: None (Preliminary result)    Collection Time: 09/03/22 10:25 AM   Specimen: BLOOD RIGHT HAND  Result Value Ref Range Status   Specimen Description BLOOD RIGHT HAND  Final   Special Requests   Final    BOTTLES DRAWN AEROBIC AND ANAEROBIC Blood Culture adequate volume   Culture   Final    NO GROWTH 2 DAYS Performed at [facility], [place] [address] [place], [place] [address]    Report Status PENDING  Incomplete  Respiratory (~20 pathogens) panel by PCR     Status: None   Collection Time: 09/04/22  6:00 AM   Specimen: Nasopharyngeal Swab; Respiratory  Result Value Ref Range Status   Adenovirus NOT DETECTED NOT DETECTED Final   Coronavirus 229E NOT DETECTED NOT DETECTED Final    Comment: (NOTE) The Coronavirus on the Respiratory Panel, DOES NOT test for the novel  Coronavirus (2019 nCoV)    Coronavirus HKU1 NOT DETECTED NOT DETECTED Final   Coronavirus NL63 NOT DETECTED NOT DETECTED Final   Coronavirus OC43 NOT DETECTED NOT DETECTED Final   Metapneumovirus NOT DETECTED NOT DETECTED Final   Rhinovirus / Enterovirus NOT DETECTED NOT DETECTED Final   Influenza A NOT DETECTED NOT DETECTED Final   Influenza B NOT DETECTED NOT DETECTED Final   Parainfluenza Virus 1 NOT DETECTED NOT DETECTED Final   Parainfluenza Virus 2 NOT DETECTED NOT DETECTED Final   Parainfluenza Virus 3 NOT DETECTED NOT DETECTED Final   Parainfluenza Virus 4 NOT DETECTED NOT DETECTED Final   Respiratory Syncytial Virus NOT DETECTED NOT DETECTED Final   Bordetella pertussis NOT DETECTED NOT DETECTED Final   Bordetella Parapertussis NOT DETECTED NOT DETECTED Final   Chlamydophila pneumoniae NOT DETECTED NOT DETECTED Final   Mycoplasma pneumoniae NOT DETECTED NOT DETECTED Final    Comment: Performed at [facility], [place]. [address], [place], [place] [address]      Radiology Studies: ECHOCARDIOGRAM COMPLETE  Result Date: 09/04/2022    ECHOCARDIOGRAM REPORT   Patient Name:   TAKILA KRONBERG Date of Exam: 09/04/2022 Medical  Rec #:  [id number]                Height:       60.0 in Accession #:    [id number]               Weight:       136.9 lb Date of Birth:  Dec 16, 1927  BSA:          1.589 m Patient Age:    82 years                 BP:           129/64 mmHg Patient Gender: F                        HR:           62 bpm. Exam Location:  Inpatient Procedure: 2D Echo, Cardiac Doppler and Color Doppler Indications:    CHF-acute systolic  History:        Patient has prior history of Echocardiogram examinations, most                 recent 12/09/2021. CAD, Pulmonary HTN, Arrythmias:Atrial                 Fibrillation; Signs/Symptoms:Shortness of Breath. CKD.  Sonographer:    Clayton Lefort RDCS (AE) Referring Phys: [id number] RONDELL A SMITH IMPRESSIONS  1. Left ventricular ejection fraction, by estimation, is 50 to 55%. The left ventricle has low normal function. The left ventricle demonstrates global hypokinesis. There is moderate left ventricular hypertrophy of the basal-septal segment. Left ventricular diastolic function could not be evaluated.  2. Right ventricular systolic function is mildly reduced. The right ventricular size is moderately enlarged.  3. Left atrial size was mildly dilated.  4. Right atrial size was mild to moderately dilated.  5. The mitral valve is degenerative. Moderate to severe mitral valve regurgitation. No evidence of mitral stenosis.  6. The aortic valve is tricuspid. Aortic valve regurgitation is not visualized. Aortic valve sclerosis/calcification is present, without any evidence of aortic stenosis. Comparison(s): No significant change from prior study. FINDINGS  Left Ventricle: Left ventricular ejection fraction, by estimation, is 50 to 55%. The left ventricle has low normal function. The left ventricle demonstrates global hypokinesis. The left ventricular internal cavity size was normal in size. There is moderate left ventricular hypertrophy of the basal-septal segment. Left ventricular diastolic  function could not be evaluated due to atrial fibrillation. Left ventricular diastolic function could not be evaluated. Right Ventricle: The right ventricular size is moderately enlarged. No increase in right ventricular wall thickness. Right ventricular systolic function is mildly reduced. Left Atrium: Left atrial size was mildly dilated. Right Atrium: Right atrial size was mild to moderately dilated. Pericardium: There is no evidence of pericardial effusion. Mitral Valve: The mitral valve is degenerative in appearance. Mild to moderate mitral annular calcification. Moderate to severe mitral valve regurgitation. No evidence of mitral valve stenosis. Tricuspid Valve: The tricuspid valve is grossly normal. Tricuspid valve regurgitation is mild . No evidence of tricuspid stenosis. Aortic Valve: The aortic valve is tricuspid. Aortic valve regurgitation is not visualized. Aortic valve sclerosis/calcification is present, without any evidence of aortic stenosis. Aortic valve mean gradient measures 4.0 mmHg. Aortic valve peak gradient measures 7.8 mmHg. Aortic valve area, by VTI measures 1.63 cm. Pulmonic Valve: The pulmonic valve was grossly normal. Pulmonic valve regurgitation is trivial. No evidence of pulmonic stenosis. Aorta: The aortic root and ascending aorta are structurally normal, with no evidence of dilitation. Venous: The inferior vena cava was not well visualized. IAS/Shunts: The atrial septum is grossly normal.  LEFT VENTRICLE PLAX 2D LVIDd:         4.60 cm LVIDs:         3.20 cm LV PW:  1.10 cm LV IVS:        1.40 cm LVOT diam:     1.90 cm LV SV:         58 LV SV Index:   37 LVOT Area:     2.84 cm  RIGHT VENTRICLE RV Basal diam:  4.40 cm RV Mid diam:    4.70 cm RV S prime:     8.49 cm/s TAPSE (M-mode): 1.6 cm LEFT ATRIUM             Index        RIGHT ATRIUM           Index LA diam:        4.30 cm 2.71 cm/m   RA Area:     24.00 cm LA Vol (A2C):   61.4 ml 38.64 ml/m  RA Volume:   73.20 ml  46.07  ml/m LA Vol (A4C):   52.7 ml 33.17 ml/m LA Biplane Vol: 58.4 ml 36.76 ml/m  AORTIC VALVE AV Area (Vmax):    1.67 cm AV Area (Vmean):   1.49 cm AV Area (VTI):     1.63 cm AV Vmax:           140.00 cm/s AV Vmean:          98.100 cm/s AV VTI:            0.358 m AV Peak Grad:      7.8 mmHg AV Mean Grad:      4.0 mmHg LVOT Vmax:         82.50 cm/s LVOT Vmean:        51.700 cm/s LVOT VTI:          0.206 m LVOT/AV VTI ratio: 0.58  AORTA Ao Root diam: 3.10 cm Ao Asc diam:  3.30 cm TRICUSPID VALVE TR Peak grad:   36.5 mmHg TR Vmax:        302.00 cm/s  SHUNTS Systemic VTI:  0.21 m Systemic Diam: 1.90 cm Eleonore Chiquito MD Electronically signed by Eleonore Chiquito MD Signature Date/Time: 09/04/2022/2:03:01 PM    Final     Scheduled Meds:  acidophilus  1 capsule Oral Daily   apixaban  5 mg Oral BID   atorvastatin  10 mg Oral Daily   azithromycin  500 mg Oral Daily   Chlorhexidine Gluconate Cloth  6 each Topical Daily   diltiazem  60 mg Oral TID   furosemide  40 mg Oral Daily   metoprolol tartrate  100 mg Oral BID   pantoprazole  40 mg Oral Daily   potassium chloride SA  20 mEq Oral Daily   Continuous Infusions:  cefTRIAXone (ROCEPHIN)  IV 2 g (09/05/22 0916)     LOS: 2 days   Time spent: 35 minutes   Harnoor Kohles Loann Quill, MD [facility]  If 7PM-7AM, please contact night-coverage [email or web address] 09/05/2022, 1:01 PM

## 2022-09-06 DIAGNOSIS — J9601 Acute respiratory failure with hypoxia: Secondary | ICD-10-CM | POA: Diagnosis not present

## 2022-09-06 LAB — CBC
HCT: 37.1 % (ref 36.0–46.0)
Hemoglobin: 12.1 g/dL (ref 12.0–15.0)
MCH: 30.6 pg (ref 26.0–34.0)
MCHC: 32.6 g/dL (ref 30.0–36.0)
MCV: 93.9 fL (ref 80.0–100.0)
Platelets: 221 10*3/uL (ref 150–400)
RBC: 3.95 MIL/uL (ref 3.87–5.11)
RDW: 13.5 % (ref 11.5–15.5)
WBC: 17.4 10*3/uL — ABNORMAL HIGH (ref 4.0–10.5)
nRBC: 0 % (ref 0.0–0.2)

## 2022-09-06 LAB — COMPREHENSIVE METABOLIC PANEL
ALT: 16 U/L (ref 0–44)
AST: 19 U/L (ref 15–41)
Albumin: 3.2 g/dL — ABNORMAL LOW (ref 3.5–5.0)
Alkaline Phosphatase: 73 U/L (ref 38–126)
Anion gap: 11 (ref 5–15)
BUN: 26 mg/dL — ABNORMAL HIGH (ref 8–23)
CO2: 27 mmol/L (ref 22–32)
Calcium: 9 mg/dL (ref 8.9–10.3)
Chloride: 98 mmol/L (ref 98–111)
Creatinine, Ser: 0.92 mg/dL (ref 0.44–1.00)
GFR, Estimated: 58 mL/min — ABNORMAL LOW (ref >60)
Glucose, Bld: 86 mg/dL (ref 70–99)
Potassium: 4.2 mmol/L (ref 3.5–5.1)
Sodium: 136 mmol/L (ref 135–145)
Total Bilirubin: 0.7 mg/dL (ref 0.3–1.2)
Total Protein: 6.4 g/dL — ABNORMAL LOW (ref 6.5–8.1)

## 2022-09-06 LAB — MAGNESIUM: Magnesium: 2 mg/dL (ref 1.7–2.4)

## 2022-09-06 MED ORDER — FUROSEMIDE 10 MG/ML IJ SOLN
40.0000 mg | Freq: Once | INTRAMUSCULAR | Status: AC
  Administered 2022-09-06: 40 mg via INTRAVENOUS
  Filled 2022-09-06: qty 4

## 2022-09-06 MED ORDER — FOSFOMYCIN TROMETHAMINE 3 G PO PACK
3.0000 g | PACK | Freq: Once | ORAL | Status: AC
  Administered 2022-09-06: 3 g via ORAL
  Filled 2022-09-06: qty 3

## 2022-09-06 NOTE — Progress Notes (Signed)
SATURATION QUALIFICATIONS: (This note is used to comply with regulatory documentation for home oxygen) ° °Patient Saturations on Room Air at Rest = 95% ° °Patient Saturations on Room Air while Ambulating = 87% ° °Patient Saturations on 2 Liters of oxygen while Ambulating = 93% ° °Please briefly explain why patient needs home oxygen: °

## 2022-09-06 NOTE — Progress Notes (Addendum)
PROGRESS NOTE    Adrienne [name]  DJS:[id number] DOB: 06-03-1928 DOA: 09/03/2022 PCP: Kathyrn Lass, MD   Brief Narrative:  Adrienne [name] is a 88 y.o. female with medical history significant of hypertension, hyperlipidemia, atrial fibrillation, CAD, pulmonary hypertension, CKD, breast cancer s/p radiation, and GERD presents with complaints of acute onset of shortness of breath. In route with EMS patient was noted to be tachypneic and wheezing and all lung fields.  Patient received DuoNeb breathing treatment and 125 mg of Solu-Medrol IV.   Upon admission into the emergency department patient was noted to be afebrile with heart rates 83-1 02, respirations 18-33, blood pressures 110/94-157/72, and O2 saturations as low as 86% with improvement on BiPAP greater than 92%.  Labs significant for WBC 16.2, BUN 21, creatinine 1.08, high-sensitivity troponin 9, and BNP 420.7.  Chest x-ray noted subtle interstitial and patchy airspace opacity of the right lung base concerning for atelectasis versus infection.  Respiratory virus panel was pending.  Patient has been given full dose aspirin, Lasix 40 mg IV, magnesium 1 g IV, potassium chloride 40 meq p.o., Rocephin, and azithromycin.  She was able to be weaned off BiPAP to room air.  Assessment & Plan:   Acute respiratory failure with hypoxia 2/2 possible community-acquired pneumonia: -Initially hypoxic down to 86% with tachypnea requiring placement on BiPAP. Chest x-ray noted interstitial and patchy airspace opacity in the right lung base which may atelectasis versus infection- - Influenza, COVID-19, and RSV screening negative.respiratory panel: Negative.  Strep pneumonia negative.  Legionella pending. Patient reports a history of wheezing so wondering if this is some sort of reactive airway disease. Doubt PE as patient reports compliance with anticoagulation. -on Rocephin and azithromycin. - add prednisone   Heart failure with preserved  EF Pulmonary artery hypertension Mitral regurgitation -Does not appears to be in fluid overload. Her weight is at baseline. -BNP was elevated at 420 (below baseline).Patient has been given Lasix 40 mg IV x 2 dose. Reviewed ECHO-EF: 50 to 55% with moderate LVH of basal septal segment.  Left ventricle demonstrated global hypokinesis.  Moderate to severe MR may be contributing to symptoms. Reports she is still very dyspneic with ambulation - will re-start lasix 40 iv qd   urinary tract infection -Patient has a chronic indwelling Foley with history of uterine prolapse -followed by [facility] -Seen by urology on 12/23 and was started on nitrofurantoin for concern for possible urinary tract infection, patient reports lower abd pain and burning. Culture growing pseudomonas -will give a one time dose of fosfomycin. Pt has a quinolone allergy - f/u sensitivities  CKD stage IIIb: Creatinine at baseline.  Continue to monitor   Leukocytosis -Likely in the setting of UTI and possible pneumonia.  She is afebrile.   Chronic atrial fibrillation on chronic anticoagulation Patient presented in atrial fibrillation with heart rates relatively controlled. -Continue Eliquis, Cardizem, metoprolol -Goal potassium at least 4 and magnesium at least 2   Essential hypertension -Continue metoprolol and Cardizem   History of CVA -Continue statin and Eliquis   CKD stage III -Continue to monitor kidney function with diuresis   History of breast cancer -Continue outpatient follow-up with oncology   Hyperlipidemia -Continue atorvastatin  GERD: Continue PPI  DVT prophylaxis: Eliquis Code Status: Full code Family Communication: Patient's husband present at bedside.    Disposition Plan: home  Consultants:  None  Procedures:  None  Antimicrobials:  Rocephin Azithromycin  Status is: Inpatient    Subjective: Patient seen and examined.  Patient's husband at the bedside.  Reports that she is  not feeling well and continues to have difficulty breathing.  No chest pain but endorses orthopnea.   Objective: Vitals:   09/05/22 2029 09/06/22 0352 09/06/22 0942 09/06/22 0953  BP: (!) 171/84 (!) 161/68 (!) 161/71   Pulse: 83 85 (!) 113 90  Resp: 17 (!) 21 16   Temp: 98.5 F (36.9 C) 98.4 F (36.9 C) 98.2 F (36.8 C)   TempSrc: Oral Oral Oral   SpO2: 95% 95% 97%   Weight:      Height:        Intake/Output Summary (Last 24 hours) at 09/06/2022 1331 Last data filed at 09/06/2022 0354 Gross per 24 hour  Intake 120 ml  Output 3000 ml  Net -2880 ml   Filed Weights   09/03/22 0840 09/04/22 0500 09/05/22 0608  Weight: 61.3 kg 62.1 kg 60.5 kg    Examination:  General exam: Appears calm and comfortable, elderly female, on nasal cannula, husband at the bedside, communicating well Respiratory system: exp wheeze throughout Cardiovascular system: S1 & S2 heard, RRR. No JVD, murmurs, rubs, gallops or clicks. trace pedal edema. Gastrointestinal system: Abdomen is nondistended, soft and nontender. No organomegaly or masses felt. Normal bowel sounds heard.  Foley catheter in placed. Central nervous system: Alert and oriented. No focal neurological deficits. Extremities: Symmetric 5 x 5 power. Skin: No rashes, lesions or ulcers Psychiatry: Judgement and insight appear normal. Mood & affect appropriate.    Data Reviewed: I have personally reviewed following labs and imaging studies  CBC: Recent Labs  Lab 09/03/22 0850 09/03/22 0856 09/04/22 0232 09/05/22 0242 09/06/22 0337  WBC 16.2*  --  17.1* 19.4* 17.4*  NEUTROABS 13.1*  --   --   --   --   HGB 13.1 13.6 11.0* 11.7* 12.1  HCT 39.5 40.0 32.8* 35.9* 37.1  MCV 93.4  --  92.9 94.2 93.9  PLT 219  --  182 206 570   Basic Metabolic Panel: Recent Labs  Lab 09/03/22 0850 09/03/22 0856 09/04/22 0232 09/04/22 0836 09/05/22 0242 09/06/22 0337  NA 137 136 137  --  137 136  K 3.5 3.5 3.8  --  4.5 4.2  CL 99  --  100  --   101 98  CO2 28  --  27  --  26 27  GLUCOSE 149*  --  150*  --  104* 86  BUN 21  --  25*  --  32* 26*  CREATININE 1.08*  --  1.07*  --  1.05* 0.92  CALCIUM 9.4  --  8.9  --  8.9 9.0  MG 1.7  --   --  2.0 2.1 2.0   GFR: Estimated Creatinine Clearance: 31.7 mL/min (by C-G formula based on SCr of 0.92 mg/dL). Liver Function Tests: Recent Labs  Lab 09/03/22 0850 09/06/22 0337  AST 25 19  ALT 17 16  ALKPHOS 85 73  BILITOT 0.8 0.7  PROT 7.3 6.4*  ALBUMIN 3.9 3.2*   No results for input(s): "LIPASE", "AMYLASE" in the last 168 hours. No results for input(s): "AMMONIA" in the last 168 hours. Coagulation Profile: No results for input(s): "INR", "PROTIME" in the last 168 hours. Cardiac Enzymes: No results for input(s): "CKTOTAL", "CKMB", "CKMBINDEX", "TROPONINI" in the last 168 hours. BNP (last 3 results) No results for input(s): "PROBNP" in the last 8760 hours. HbA1C: No results for input(s): "HGBA1C" in the last 72 hours. CBG: No results for input(s): "GLUCAP"  in the last 168 hours. Lipid Profile: No results for input(s): "CHOL", "HDL", "LDLCALC", "TRIG", "CHOLHDL", "LDLDIRECT" in the last 72 hours. Thyroid Function Tests: No results for input(s): "TSH", "T4TOTAL", "FREET4", "T3FREE", "THYROIDAB" in the last 72 hours. Anemia Panel: No results for input(s): "VITAMINB12", "FOLATE", "FERRITIN", "TIBC", "IRON", "RETICCTPCT" in the last 72 hours. Sepsis Labs: Recent Labs  Lab 09/03/22 0859  PROCALCITON <0.10    Recent Results (from the past 240 hour(s))  Resp panel by RT-PCR (RSV, Flu A&B, Covid) Anterior Nasal Swab     Status: None   Collection Time: 09/03/22  8:50 AM   Specimen: Anterior Nasal Swab  Result Value Ref Range Status   SARS Coronavirus 2 by RT PCR NEGATIVE NEGATIVE Final    Comment: (NOTE) SARS-CoV-2 target nucleic acids are NOT DETECTED.  The SARS-CoV-2 RNA is generally detectable in upper respiratory specimens during the acute phase of infection. The  lowest concentration of SARS-CoV-2 viral copies this assay can detect is 138 copies/mL. A negative result does not preclude SARS-Cov-2 infection and should not be used as the sole basis for treatment or other patient management decisions. A negative result may occur with  improper specimen collection/handling, submission of specimen other than nasopharyngeal swab, presence of viral mutation(s) within the areas targeted by this assay, and inadequate number of viral copies(<138 copies/mL). A negative result must be combined with clinical observations, patient history, and epidemiological information. The expected result is Negative.  Fact Sheet for Patients:  EntrepreneurPulse.com.au  Fact Sheet for Healthcare Providers:  IncredibleEmployment.be  This test is no t yet approved or cleared by the [place] FDA and  has been authorized for detection and/or diagnosis of SARS-CoV-2 by FDA under an Emergency Use Authorization (EUA). This EUA will remain  in effect (meaning this test can be used) for the duration of the COVID-19 declaration under Section 564(b)(1) of the Act, 21 U.S.C.section 360bbb-3(b)(1), unless the authorization is terminated  or revoked sooner.       Influenza A by PCR NEGATIVE NEGATIVE Final   Influenza B by PCR NEGATIVE NEGATIVE Final    Comment: (NOTE) The Xpert Xpress SARS-CoV-2/FLU/RSV plus assay is intended as an aid in the diagnosis of influenza from Nasopharyngeal swab specimens and should not be used as a sole basis for treatment. Nasal washings and aspirates are unacceptable for Xpert Xpress SARS-CoV-2/FLU/RSV testing.  Fact Sheet for Patients: EntrepreneurPulse.com.au  Fact Sheet for Healthcare Providers: IncredibleEmployment.be  This test is not yet approved or cleared by the [place] FDA and has been authorized for detection and/or diagnosis of SARS-CoV-2 by FDA under  an Emergency Use Authorization (EUA). This EUA will remain in effect (meaning this test can be used) for the duration of the COVID-19 declaration under Section 564(b)(1) of the Act, 21 U.S.C. section 360bbb-3(b)(1), unless the authorization is terminated or revoked.     Resp Syncytial Virus by PCR NEGATIVE NEGATIVE Final    Comment: (NOTE) Fact Sheet for Patients: EntrepreneurPulse.com.au  Fact Sheet for Healthcare Providers: IncredibleEmployment.be  This test is not yet approved or cleared by the [place] FDA and has been authorized for detection and/or diagnosis of SARS-CoV-2 by FDA under an Emergency Use Authorization (EUA). This EUA will remain in effect (meaning this test can be used) for the duration of the COVID-19 declaration under Section 564(b)(1) of the Act, 21 U.S.C. section 360bbb-3(b)(1), unless the authorization is terminated or revoked.  Performed at [facility], [place] [address] [place], [place] [address]   Urine  Culture     Status: Abnormal (Preliminary result)   Collection Time: 09/03/22 10:02 AM   Specimen: Urine, Clean Catch  Result Value Ref Range Status   Specimen Description URINE, CLEAN CATCH  Final   Special Requests NONE  Final   Culture (A)  Final    60,000 COLONIES/mL PSEUDOMONAS AERUGINOSA SUSCEPTIBILITIES TO FOLLOW Performed at [facility], [address], [place], [place] [address]    Report Status PENDING  Incomplete  Blood culture (routine x 2)     Status: None (Preliminary result)   Collection Time: 09/03/22 10:15 AM   Specimen: BLOOD LEFT HAND  Result Value Ref Range Status   Specimen Description BLOOD LEFT HAND  Final   Special Requests   Final    BOTTLES DRAWN AEROBIC AND ANAEROBIC Blood Culture adequate volume   Culture   Final    NO GROWTH 3 DAYS Performed at [facility], [place] [address], [place], [place] [address]    Report Status PENDING  Incomplete  Blood culture  (routine x 2)     Status: None (Preliminary result)   Collection Time: 09/03/22 10:25 AM   Specimen: BLOOD RIGHT HAND  Result Value Ref Range Status   Specimen Description BLOOD RIGHT HAND  Final   Special Requests   Final    BOTTLES DRAWN AEROBIC AND ANAEROBIC Blood Culture adequate volume   Culture   Final    NO GROWTH 3 DAYS Performed at [facility], [place] [address], [place], [place] [address]    Report Status PENDING  Incomplete  Respiratory (~20 pathogens) panel by PCR     Status: None   Collection Time: 09/04/22  6:00 AM   Specimen: Nasopharyngeal Swab; Respiratory  Result Value Ref Range Status   Adenovirus NOT DETECTED NOT DETECTED Final   Coronavirus 229E NOT DETECTED NOT DETECTED Final    Comment: (NOTE) The Coronavirus on the Respiratory Panel, DOES NOT test for the novel  Coronavirus (2019 nCoV)    Coronavirus HKU1 NOT DETECTED NOT DETECTED Final   Coronavirus NL63 NOT DETECTED NOT DETECTED Final   Coronavirus OC43 NOT DETECTED NOT DETECTED Final   Metapneumovirus NOT DETECTED NOT DETECTED Final   Rhinovirus / Enterovirus NOT DETECTED NOT DETECTED Final   Influenza A NOT DETECTED NOT DETECTED Final   Influenza B NOT DETECTED NOT DETECTED Final   Parainfluenza Virus 1 NOT DETECTED NOT DETECTED Final   Parainfluenza Virus 2 NOT DETECTED NOT DETECTED Final   Parainfluenza Virus 3 NOT DETECTED NOT DETECTED Final   Parainfluenza Virus 4 NOT DETECTED NOT DETECTED Final   Respiratory Syncytial Virus NOT DETECTED NOT DETECTED Final   Bordetella pertussis NOT DETECTED NOT DETECTED Final   Bordetella Parapertussis NOT DETECTED NOT DETECTED Final   Chlamydophila pneumoniae NOT DETECTED NOT DETECTED Final   Mycoplasma pneumoniae NOT DETECTED NOT DETECTED Final    Comment: Performed at [facility], [place]. [address] [place], [place] [address]      Radiology Studies: No results found.  Scheduled Meds:  acidophilus  1 capsule Oral Daily   apixaban  5 mg Oral BID    atorvastatin  10 mg Oral Daily   azithromycin  500 mg Oral Daily   Chlorhexidine Gluconate Cloth  6 each Topical Daily   diltiazem  60 mg Oral TID   furosemide  40 mg Oral Daily   metoprolol tartrate  100 mg Oral BID   pantoprazole  40 mg Oral Daily  potassium chloride SA  20 mEq Oral Daily   Continuous Infusions:  cefTRIAXone (ROCEPHIN)  IV 2 g (09/06/22 0934)     LOS: 3 days   Time spent: 35 minutes   Desma Maxim, MD [facility]  If 7PM-7AM, please contact night-coverage [email or web address] 09/06/2022, 1:31 PM

## 2022-09-06 NOTE — Progress Notes (Signed)
Mobility Specialist Progress Note   09/06/22 1550  Mobility  Activity Ambulated with assistance in hallway  Level of Assistance Standby assist, set-up cues, supervision of patient - no hands on  Assistive Device Front wheel walker  Distance Ambulated (ft) 150 ft  Range of Motion/Exercises Active;All extremities  Activity Response Tolerated well   Pre Ambulation:  HR 76, SpO2 95% RA During Ambulation: HR 87, SpO2 87% RA; 93% 2LO2 Post Ambulation: HR 80, SpO2 95% 2LO2  Patient received in chair and agreeable to participate. Ambulated supervision level with slow step-to gait. C/o pain in RLE (foot) that limited speed. Oxygen did desaturate to 87% on RA with symptomatic lightheadedness requiring 2LO2 to maintain a saturation >93%. Symptoms improved quickly as oxygen increased. Returned to room without incident. Was left in chair with all needs met, call bell in reach.   Adrienne [name], BS EXP Mobility Specialist Please contact via SecureChat or Rehab office at [phone]

## 2022-09-07 DIAGNOSIS — I509 Heart failure, unspecified: Secondary | ICD-10-CM

## 2022-09-07 DIAGNOSIS — J9601 Acute respiratory failure with hypoxia: Secondary | ICD-10-CM | POA: Diagnosis not present

## 2022-09-07 DIAGNOSIS — I5031 Acute diastolic (congestive) heart failure: Secondary | ICD-10-CM | POA: Diagnosis not present

## 2022-09-07 LAB — CBC
HCT: 33.3 % — ABNORMAL LOW (ref 36.0–46.0)
Hemoglobin: 11.3 g/dL — ABNORMAL LOW (ref 12.0–15.0)
MCH: 31.2 pg (ref 26.0–34.0)
MCHC: 33.9 g/dL (ref 30.0–36.0)
MCV: 92 fL (ref 80.0–100.0)
Platelets: 206 10*3/uL (ref 150–400)
RBC: 3.62 MIL/uL — ABNORMAL LOW (ref 3.87–5.11)
RDW: 13.2 % (ref 11.5–15.5)
WBC: 10.6 10*3/uL — ABNORMAL HIGH (ref 4.0–10.5)
nRBC: 0 % (ref 0.0–0.2)

## 2022-09-07 LAB — URIC ACID: Uric Acid, Serum: 7.3 mg/dL — ABNORMAL HIGH (ref 2.5–7.1)

## 2022-09-07 LAB — URINE CULTURE: Culture: 60000 — AB

## 2022-09-07 LAB — BASIC METABOLIC PANEL
Anion gap: 8 (ref 5–15)
BUN: 22 mg/dL (ref 8–23)
CO2: 31 mmol/L (ref 22–32)
Calcium: 8.6 mg/dL — ABNORMAL LOW (ref 8.9–10.3)
Chloride: 97 mmol/L — ABNORMAL LOW (ref 98–111)
Creatinine, Ser: 0.94 mg/dL (ref 0.44–1.00)
GFR, Estimated: 57 mL/min — ABNORMAL LOW (ref >60)
Glucose, Bld: 85 mg/dL (ref 70–99)
Potassium: 3.8 mmol/L (ref 3.5–5.1)
Sodium: 136 mmol/L (ref 135–145)

## 2022-09-07 MED ORDER — SPIRONOLACTONE 25 MG PO TABS
25.0000 mg | ORAL_TABLET | Freq: Every day | ORAL | Status: DC
  Administered 2022-09-07 – 2022-09-09 (×3): 25 mg via ORAL
  Filled 2022-09-07 (×3): qty 1

## 2022-09-07 MED ORDER — COLCHICINE 0.6 MG PO TABS
0.6000 mg | ORAL_TABLET | Freq: Every day | ORAL | Status: DC
  Administered 2022-09-07 – 2022-09-09 (×3): 0.6 mg via ORAL
  Filled 2022-09-07 (×3): qty 1

## 2022-09-07 MED ORDER — FUROSEMIDE 40 MG PO TABS
40.0000 mg | ORAL_TABLET | Freq: Every day | ORAL | Status: DC
  Administered 2022-09-08 – 2022-09-09 (×2): 40 mg via ORAL
  Filled 2022-09-07 (×2): qty 1

## 2022-09-07 MED ORDER — FUROSEMIDE 10 MG/ML IJ SOLN: 40.0000 mg | Freq: Every day | INTRAMUSCULAR | Status: DC

## 2022-09-07 MED ORDER — ALLOPURINOL 100 MG PO TABS
100.0000 mg | ORAL_TABLET | Freq: Every day | ORAL | Status: DC
  Administered 2022-09-07 – 2022-09-09 (×3): 100 mg via ORAL
  Filled 2022-09-07 (×3): qty 1

## 2022-09-07 MED ORDER — FUROSEMIDE 10 MG/ML IJ SOLN
40.0000 mg | Freq: Once | INTRAMUSCULAR | Status: AC
  Administered 2022-09-07: 40 mg via INTRAVENOUS
  Filled 2022-09-07: qty 4

## 2022-09-07 MED ORDER — PIPERACILLIN-TAZOBACTAM 3.375 G IVPB
3.3750 g | Freq: Three times a day (TID) | INTRAVENOUS | Status: DC
  Administered 2022-09-07 – 2022-09-09 (×6): 3.375 g via INTRAVENOUS
  Filled 2022-09-07 (×6): qty 50

## 2022-09-07 NOTE — Progress Notes (Signed)
Pharmacy Antibiotic Note  Adrienne [name] is a 88 y.o. female admitted on 09/03/2022 with acute onset shortness of breath likely 2/2 CAP. Hx of chronic indwelling foley catheter and endorses lower abdomen pain and burning. Pharmacy has been consulted for Zosyn dosing.  WBC improved 17 >> 10. Afebrile. Scr 0.94 - stable.   Plan: Zosyn 3.375g IV q8h (4 hour infusion). Monitor clinical improvement, antibiotic length of therapy  Height: 5' (152.4 cm) Weight: 60.2 kg (132 lb 11.5 oz) IBW/kg (Calculated) : 45.5  Temp (24hrs), Avg:98.3 F (36.8 C), Min:97.9 F (36.6 C), Max:98.6 F (37 C)  Recent Labs  Lab 09/03/22 0850 09/04/22 0232 09/05/22 0242 09/06/22 0337 09/07/22 0324  WBC 16.2* 17.1* 19.4* 17.4* 10.6*  CREATININE 1.08* 1.07* 1.05* 0.92 0.94    Estimated Creatinine Clearance: 31 mL/min (by C-G formula based on SCr of 0.94 mg/dL).    Allergies  Allergen Reactions   Valium [Diazepam] Other (See Comments)    HYPERACTIVITY    Amiodarone Hcl [Amiodarone] Other (See Comments)    AFIB   Compazine [Prochlorperazine] Other (See Comments)    HYPERACTIVITY    Other Other (See Comments)    "Kidney dye"--patient states this gave her severe shakes/chills   Thorazine [Chlorpromazine] Other (See Comments)    HYPERACTIVITY    Zometa [Zoledronic Acid] Other (See Comments)    PROBLEMS WITH EYES  TIA   Ciprofloxacin Swelling    "swelling" in Rt.elbow   Doxycycline Nausea Only   Microbiology Data: 12/23 Bcx: NGTD  12/23 UCX: 60K PsA 12/24 resp panel: negative  Antimicrobials this Admission: CTX 12/23>>12/27 Azithromycin 12/23 >> 12/28 Fosfomycin x1 12/26 Zosyn 12/27 >>   Thank you for allowing pharmacy to be a part of this patient's care.  Dimple Nanas, PharmD, BCPS 09/07/2022 12:35 PM

## 2022-09-07 NOTE — Care Management Important Message (Signed)
Important Message  Patient Details  Name: Adrienne [name] MRN: [id number] Date of Birth: 1928-02-02   Medicare Important Message Given:  Yes     [name] 09/07/2022, 11:51 AM

## 2022-09-07 NOTE — TOC Transition Note (Signed)
Transition of Care [facility]) - CM/SW Discharge Note   Patient Details  Name: Adrienne [name] MRN: [id number] Date of Birth: 1928-03-01  [facility]) CM/SW Contact:  Carles Collet, RN Phone Number: 09/07/2022, 12:07 PM   Clinical Narrative:     Damaris Schooner w patient and spouse at bedside. Discussed needs for DC.  Patient will need home oxygen. Would like to try different company than the one she had a few years ago, believes it was [employer]. Agreeable to try [employer].  Referral for home O2 given to [employer] who will provide O2 to room today for preporation for DC.  No HH needs, confirmed w patent that she has DME, rollator at home. Spouse at bedside will transport home when [facility].  No other TOC needs identified at this time.     Barriers to Discharge: No Barriers Identified   Patient Goals and CMS Choice CMS Medicare.gov Compare Post Acute Care list provided to:: Patient Choice offered to / list presented to : Patient  Discharge Placement                         Discharge Plan and Services Additional resources added to the After Visit Summary for     Discharge Planning Services: CM Consult Post Acute Care Choice: Durable Medical Equipment          DME Arranged: Oxygen DME Agency: [employer] Date DME Agency Contacted: 09/07/22 Time DME Agency Contacted: 1207 Representative spoke with at DME Agency: Brenton Grills            Social Determinants of Health ([place]) Interventions SDOH Screenings   Tobacco Use: Medium Risk (09/03/2022)     Readmission Risk Interventions     No data to display

## 2022-09-07 NOTE — Consult Note (Signed)
Cardiology Consultation   Patient ID: Tesneem Dufrane MRN: [id number]; DOB: 04/27/1928  Admit date: 09/03/2022 Date of Consult: 09/07/2022  PCP:  Kathyrn Lass, MD   [facility] Cardiologist:  Candee Furbish, MD     Patient Profile:   Jaelyne Deeg is a 88 y.o. female with a hx of hypertension, hyperlipidemia, chronic atrial fibrillation, CAD, history of CVA, history of GI bleed, pulmonary hypertension, CKD, history of breast cancer s/p radiation, GERD who is being seen 09/07/2022 for the evaluation of chronic diastolic heart failure at the request of Dr. Tyrell Antonio.  History of Present Illness:   Ms. Windsor is a 88 year old female with above medical history who is followed by Dr. Marlou Porch.  Per chart review, patient established care with Dr. Marlou Porch in 2018.  At that time, patient already had a known history of atrial fibrillation, CAD, mitral valve regurgitation.  Reportedly, patient had a cardiac catheterization in 10/2011 that showed nonobstructive coronary artery disease.  As patient had a history of atrial fibrillation, she was anticoagulated with Coumadin Coumadin. However she developed a GI bleed in 12/2015 and Coumadin was held.  Fortunately, GI workup was unremarkable with diverticulosis.  Her Coumadin was resumed, but a week after she had a left MCA infarct.  She was transitioned to Eliquis after her stroke.  Attempted rhythm control with amiodarone, but patient did not tolerate it.  Instead, patient has been managed with rate control strategy on Cardizem and metoprolol  More recently, patient was admitted to the hospital in 08/2021 with heart failure.  Patient presented complaining of lower extremity edema, shortness of breath.  Cardiogram on 08/18/2021 that showed EF 55-60%, no regional wall motion abnormalities, indeterminate diastolic function, normal pulmonary artery systolic pressure.  Noted to have had moderate mitral valve regurgitation at  that time.    Patient's most recent echocardiogram from 12/09/2021 showed EF 55-60%, no regional wall motion abnormalities, indeterminate diastolic function, normal RV systolic pressure, moderate mitral valve regurgitation.  Patient was last seen by cardiology on 12/15/2021.  At that time, patient was doing well from a cardiac standpoint.  Atrial fibrillation was well rate controlled on Cardizem 60 mg 3 times daily, metoprolol 100 mg twice daily.  She was doing well on Lasix 40 mg daily.  Patient presented to the ED on 12/23 complaining of difficulty breathing.  Reportedly, patient had woken up in the morning feeling short of breath and nauseated.  She also developed a cough, chills, abdominal discomfort.  In the ED, patient had O2 saturations as low as 86% and was treated with BiPAP.  Labs significant for WBC 16.2, BUN 21, creatinine 1.08.  Troponin negative x 2.  BNP elevated to 420.7.  Chest x-ray on 12/23 showed subtle interstitial and patchy airspace opacity in the right lung base, which may reflect atelectasis versus infection.  Patient was started on antibiotics for suspected community-acquired pneumonia.  Also treated with IV Lasix.  She was successfully weaned off BiPAP and is now on room air.  Cardiology consulted due to patient's history of diastolic heart failure  On interview, patient reports that she feels much better today than she has so far this admission. She came in due to shortness of breath. Felt SOB on minimal exertion, and also when speaking in complete sentences. Also had orthopnea, ankle edema. She initially had a dry cough. Reports feeling significant improvement in symptoms after receiving IV lasix and antibiotics. She currently continues to have some SOB on exertion, but is able to  walk further distances without symptoms. Also continues to have orthopnea, mild ankle edema. Cough has improved. No chest pain, palpitations, syncope/near syncope.   Past Medical History:  Diagnosis Date    Afib ([place])    Arthritis of ankle    CAD (coronary artery disease)    Cancer ([place]) 2012   breast cancer-right   Compression fracture of T12 vertebra ([facility])    Diverticulosis    Edema    Elevated uric acid in blood    GERD (gastroesophageal reflux disease)    GI bleed 12/2015   Hyperlipidemia    Hypertension    Long-term (current) use of anticoagulants    Moderate mitral regurgitation    Personal history of radiation therapy    Pulmonary HTN ([place])    Echo 1/19: EF 60-65, no RWMA, Ao sclerosis, trivial AI, mild MR, mod BAE, mod TR, PASP 49   Stroke ([place]) 01/25/2016   TIA (transient ischemic attack) 09/2011   Urinary incontinence    Urinary retention     Past Surgical History:  Procedure Laterality Date   APPENDECTOMY     AXILLARY SENTINEL NODE BIOPSY Left 01/22/2020   Procedure: Left Axillary Sentinel Node Biopsy;  Surgeon: Rolm Bookbinder, MD;  Location: [facility];  Service: General;  Laterality: Left;   BREAST LUMPECTOMY WITH RADIOACTIVE SEED AND SENTINEL LYMPH NODE BIOPSY Left 01/22/2020   Procedure: LEFT BREAST LUMPECTOMY WITH RADIOACTIVE SEED;  Surgeon: Rolm Bookbinder, MD;  Location: [facility];  Service: General;  Laterality: Left;   BREAST SURGERY  2012   Rt.mastectomy--[place], [place] CATHETERIZATION  2013   CATARACT EXTRACTION, BILATERAL     CERVICAL CONIZATION W/BX N/A 08/08/2020   Procedure: VAGINAL SIDEWALL BIOPSY;  Surgeon: Megan Salon, MD;  Location: [facility];  Service: Gynecology;  Laterality: N/A;   DILATION AND CURETTAGE OF UTERUS     in her early 65's for DUB   ESOPHAGOGASTRODUODENOSCOPY Left 01/08/2016   Procedure: ESOPHAGOGASTRODUODENOSCOPY (EGD);  Surgeon: Arta Silence, MD;  Location: [facility];  Service: Endoscopy;  Laterality: Left;   GALLBLADDER SURGERY     MASTECTOMY Right 2012   RADIOLOGY WITH ANESTHESIA N/A 01/25/2016   Procedure: RADIOLOGY WITH ANESTHESIA;  Surgeon: Luanne Bras, MD;  Location: [facility];  Service: Radiology;  Laterality:  N/A;   REPLACEMENT TOTAL KNEE BILATERAL     TONSILLECTOMY AND ADENOIDECTOMY     as a child     Home Medications:  Prior to Admission medications   Medication Sig Start Date End Date Taking? Authorizing Provider  acetaminophen (TYLENOL) 500 MG tablet Take 500-1,000 mg by mouth every 12 (twelve) hours as needed (pain).    Yes [provider]  Ascorbic Acid (VITAMIN C WITH ROSE HIPS) 500 MG tablet Take 500 mg by mouth in the morning and at bedtime.   Yes [provider]  atorvastatin (LIPITOR) 10 MG tablet Take 1 tablet (10 mg total) by mouth daily. 08/29/22  Yes Jerline Pain, MD  diltiazem (CARDIZEM) 60 MG tablet Take 1 tablet (60 mg total) by mouth 3 (three) times daily. 08/29/22  Yes Skains, Thana Farr, MD  ELIQUIS 5 MG TABS tablet TAKE 1 TABLET TWICE A DAY Patient taking differently: Take 5 mg by mouth 2 (two) times daily. 07/19/22  Yes Jerline Pain, MD  furosemide (LASIX) 40 MG tablet Take 1 tablet (40 mg total) by mouth daily. 09/15/21  Yes Jerline Pain, MD  metoprolol tartrate (LOPRESSOR) 100 MG tablet Take 1 tablet (100 mg total) by  mouth 2 (two) times daily. 08/29/22  Yes Jerline Pain, MD  Multiple Vitamin (MULTIVITAMIN WITH MINERALS) TABS tablet Take 1 tablet by mouth daily. Centrum for Women 50+   Yes [provider]  nitrofurantoin (MACRODANTIN) 100 MG capsule Take 100 mg by mouth 2 (two) times daily. 08/03/22 09/10/22 Yes [provider]  omeprazole (PRILOSEC) 10 MG capsule Take 10 mg by mouth every other day. 03/21/19  Yes [provider]  Potassium Chloride ER 20 MEQ TBCR Take 20 mEq by mouth daily. 01/16/22  Yes [provider]  Probiotic Product (UP4 PROBIOTICS WOMENS PO) Take 1 capsule by mouth daily.   Yes [provider]  albuterol (VENTOLIN HFA) 108 (90 Base) MCG/ACT inhaler Inhale 1 puff into the lungs every 6 (six) hours as needed for wheezing or shortness of breath (Cough). Patient not taking: Reported on  09/03/2022 05/13/20   Martyn Ehrich, NP    Inpatient Medications: Scheduled Meds:  acidophilus  1 capsule Oral Daily   allopurinol  100 mg Oral Daily   apixaban  5 mg Oral BID   atorvastatin  10 mg Oral Daily   azithromycin  500 mg Oral Daily   Chlorhexidine Gluconate Cloth  6 each Topical Daily   colchicine  0.6 mg Oral Daily   diltiazem  60 mg Oral TID   [START ON 09/08/2022] furosemide  40 mg Oral Daily   metoprolol tartrate  100 mg Oral BID   pantoprazole  40 mg Oral Daily   potassium chloride SA  20 mEq Oral Daily   spironolactone  25 mg Oral Daily   Continuous Infusions:  piperacillin-tazobactam (ZOSYN)  IV     PRN Meds: acetaminophen **OR** acetaminophen, levalbuterol  Allergies:    Allergies  Allergen Reactions   Valium [Diazepam] Other (See Comments)    HYPERACTIVITY    Amiodarone Hcl [Amiodarone] Other (See Comments)    AFIB   Compazine [Prochlorperazine] Other (See Comments)    HYPERACTIVITY    Other Other (See Comments)    "Kidney dye"--patient states this gave her severe shakes/chills   Thorazine [Chlorpromazine] Other (See Comments)    HYPERACTIVITY    Zometa [Zoledronic Acid] Other (See Comments)    PROBLEMS WITH EYES  TIA   Ciprofloxacin Swelling    "swelling" in Rt.elbow   Doxycycline Nausea Only    Social History:   Social History   Socioeconomic History   Marital status: Married    Spouse name: Not on file   Number of children: Not on file   Years of education: Not on file   Highest education level: Not on file  Occupational History   Not on file  Tobacco Use   Smoking status: Former    Packs/day: 0.25    Years: 2.00    Total pack years: 0.50    Types: Cigarettes    Quit date: 1950    Years since quitting: 74.0   Smokeless tobacco: Never  Vaping Use   Vaping Use: Never used  Substance and Sexual Activity   Alcohol use: No    Alcohol/week: 0.0 standard drinks of alcohol   Drug use: No   Sexual activity: Not Currently     Partners: Male    Birth control/protection: Post-menopausal  Other Topics Concern   Not on file  Social History Narrative   Not on file   Social Determinants of Health   Financial Resource Strain: Not on file  Food Insecurity: Not on file  Transportation Needs: Not on file  Physical Activity: Not on file  Stress: Not on file  Social Connections: Not on file  Intimate Partner Violence: Not on file    Family History:    Family History  Problem Relation Age of Onset   Asthma Mother    CVA Father    Atrial fibrillation Sister        HAS PACER   Pneumonia Brother    Cancer Daughter        COLON CANCER   Cancer Maternal Grandmother        breast cancer     ROS:  Please see the history of present illness.   All other ROS reviewed and negative.     Physical Exam/Data:   Vitals:   09/06/22 2054 09/07/22 0434 09/07/22 0757 09/07/22 1601  BP: (!) 147/63 (!) 152/70 (!) 148/69 (!) 126/59  Pulse: 76 66 85 74  Resp: '17 18 17 16  '$ Temp: 98.3 F (36.8 C) 98.2 F (36.8 C) 97.9 F (36.6 C) 98.1 F (36.7 C)  TempSrc: Oral Oral Oral Oral  SpO2: 100% 99% 97% 100%  Weight:  60.2 kg    Height:        Intake/Output Summary (Last 24 hours) at 09/07/2022 1602 Last data filed at 09/07/2022 1108 Gross per 24 hour  Intake 660 ml  Output 2150 ml  Net -1490 ml      09/07/2022    4:34 AM 09/05/2022    6:08 AM 09/04/2022    5:00 AM  Last 3 Weights  Weight (lbs) 132 lb 11.5 oz 133 lb 6.1 oz 136 lb 14.5 oz  Weight (kg) 60.2 kg 60.5 kg 62.1 kg     Body mass index is 25.92 kg/m.  General:  Frail, elderly female. Sitting comfortably in the chair wearing [place]  HEENT: normal Neck: no JVD Vascular: No carotid bruits; Radial pulses 2+ bilaterally Cardiac:  normal S1, S2; RRR; grade 2/6 systolic murmur at apex  Lungs: Fine crackles in bilateral lung bases, R>L. Otherwise clear to auscultation. Normal WOB on 2 L supplemental oxygen. Speaks in complete sentences without SOB  Abd: soft,  nontender, no hepatomegaly  Ext: trace edema in BLE  Musculoskeletal:  No deformities, BUE and BLE strength normal and equal Skin: warm and dry  Neuro:  CNs 2-12 intact, no focal abnormalities noted Psych:  Normal affect   EKG:  The EKG was personally reviewed and demonstrates:  Atrial fibrillation, HR 105 BPM, nonspecific IVCD  Telemetry:  Telemetry was personally reviewed and demonstrates:  Atrial fibrillation with occasional PVCs, HR in the 50s-70s   Relevant CV Studies:  Echocardiogram 12/09/21 1. Left ventricular ejection fraction, by estimation, is 55 to 60%. The  left ventricle has normal function. The left ventricle has no regional  wall motion abnormalities. Left ventricular diastolic function could not  be evaluated.   2. Right ventricular systolic function is normal. The right ventricular  size is normal. There is moderately elevated pulmonary artery systolic  pressure.   3. Left atrial size was moderately dilated.   4. Right atrial size was moderately dilated.   5. The mitral valve is normal in structure. Moderate mitral valve  regurgitation.   6. Tricuspid valve regurgitation is mild to moderate.   7. The aortic valve is tricuspid. There is mild calcification of the  aortic valve. There is mild thickening of the aortic valve. Aortic valve  regurgitation is trivial. Aortic valve sclerosis is present, with no  evidence of aortic valve stenosis.   8.  The inferior vena cava is normal in size with greater than 50%  respiratory variability, suggesting right atrial pressure of 3 mmHg.   Comparison(s): No significant change from prior study.    Laboratory Data:  High Sensitivity Troponin:   Recent Labs  Lab 09/03/22 0850 09/03/22 1025  TROPONINIHS 9 12     Chemistry Recent Labs  Lab 09/04/22 0836 09/05/22 0242 09/06/22 0337 09/07/22 0324  NA  --  137 136 136  K  --  4.5 4.2 3.8  CL  --  101 98 97*  CO2  --  '26 27 31  '$ GLUCOSE  --  104* 86 85  BUN  --  32*  26* 22  CREATININE  --  1.05* 0.92 0.94  CALCIUM  --  8.9 9.0 8.6*  MG 2.0 2.1 2.0  --   GFRNONAA  --  50* 58* 57*  ANIONGAP  --  '10 11 8    '$ Recent Labs  Lab 09/03/22 0850 09/06/22 0337  PROT 7.3 6.4*  ALBUMIN 3.9 3.2*  AST 25 19  ALT 17 16  ALKPHOS 85 73  BILITOT 0.8 0.7   Lipids No results for input(s): "CHOL", "TRIG", "HDL", "LABVLDL", "LDLCALC", "CHOLHDL" in the last 168 hours.  Hematology Recent Labs  Lab 09/05/22 0242 09/06/22 0337 09/07/22 0324  WBC 19.4* 17.4* 10.6*  RBC 3.81* 3.95 3.62*  HGB 11.7* 12.1 11.3*  HCT 35.9* 37.1 33.3*  MCV 94.2 93.9 92.0  MCH 30.7 30.6 31.2  MCHC 32.6 32.6 33.9  RDW 13.7 13.5 13.2  PLT 206 221 206   Thyroid  Recent Labs  Lab 09/03/22 1135  TSH 3.242    BNP Recent Labs  Lab 09/03/22 0850  BNP 420.7*    DDimer No results for input(s): "DDIMER" in the last 168 hours.   Radiology/Studies:  ECHOCARDIOGRAM COMPLETE  Result Date: 09/04/2022    ECHOCARDIOGRAM REPORT   Patient Name:   CIEARA STIERWALT Date of Exam: 09/04/2022 Medical Rec #:  [id number]                Height:       60.0 in Accession #:    [id number]               Weight:       136.9 lb Date of Birth:  [birth year]                BSA:          1.589 m Patient Age:    88 years                 BP:           129/64 mmHg Patient Gender: F                        HR:           62 bpm. Exam Location:  Inpatient Procedure: 2D Echo, Cardiac Doppler and Color Doppler Indications:    CHF-acute systolic  History:        Patient has prior history of Echocardiogram examinations, most                 recent 12/09/2021. CAD, Pulmonary HTN, Arrythmias:Atrial                 Fibrillation; Signs/Symptoms:Shortness of Breath. CKD.  Sonographer:    Clayton Lefort RDCS (AE) Referring Phys: [id number] RONDELL A SMITH IMPRESSIONS  1. Left  ventricular ejection fraction, by estimation, is 50 to 55%. The left ventricle has low normal function. The left ventricle demonstrates global hypokinesis.  There is moderate left ventricular hypertrophy of the basal-septal segment. Left ventricular diastolic function could not be evaluated.  2. Right ventricular systolic function is mildly reduced. The right ventricular size is moderately enlarged.  3. Left atrial size was mildly dilated.  4. Right atrial size was mild to moderately dilated.  5. The mitral valve is degenerative. Moderate to severe mitral valve regurgitation. No evidence of mitral stenosis.  6. The aortic valve is tricuspid. Aortic valve regurgitation is not visualized. Aortic valve sclerosis/calcification is present, without any evidence of aortic stenosis. Comparison(s): No significant change from prior study. FINDINGS  Left Ventricle: Left ventricular ejection fraction, by estimation, is 50 to 55%. The left ventricle has low normal function. The left ventricle demonstrates global hypokinesis. The left ventricular internal cavity size was normal in size. There is moderate left ventricular hypertrophy of the basal-septal segment. Left ventricular diastolic function could not be evaluated due to atrial fibrillation. Left ventricular diastolic function could not be evaluated. Right Ventricle: The right ventricular size is moderately enlarged. No increase in right ventricular wall thickness. Right ventricular systolic function is mildly reduced. Left Atrium: Left atrial size was mildly dilated. Right Atrium: Right atrial size was mild to moderately dilated. Pericardium: There is no evidence of pericardial effusion. Mitral Valve: The mitral valve is degenerative in appearance. Mild to moderate mitral annular calcification. Moderate to severe mitral valve regurgitation. No evidence of mitral valve stenosis. Tricuspid Valve: The tricuspid valve is grossly normal. Tricuspid valve regurgitation is mild . No evidence of tricuspid stenosis. Aortic Valve: The aortic valve is tricuspid. Aortic valve regurgitation is not visualized. Aortic valve  sclerosis/calcification is present, without any evidence of aortic stenosis. Aortic valve mean gradient measures 4.0 mmHg. Aortic valve peak gradient measures 7.8 mmHg. Aortic valve area, by VTI measures 1.63 cm. Pulmonic Valve: The pulmonic valve was grossly normal. Pulmonic valve regurgitation is trivial. No evidence of pulmonic stenosis. Aorta: The aortic root and ascending aorta are structurally normal, with no evidence of dilitation. Venous: The inferior vena cava was not well visualized. IAS/Shunts: The atrial septum is grossly normal.  LEFT VENTRICLE PLAX 2D LVIDd:         4.60 cm LVIDs:         3.20 cm LV PW:         1.10 cm LV IVS:        1.40 cm LVOT diam:     1.90 cm LV SV:         58 LV SV Index:   37 LVOT Area:     2.84 cm  RIGHT VENTRICLE RV Basal diam:  4.40 cm RV Mid diam:    4.70 cm RV S prime:     8.49 cm/s TAPSE (M-mode): 1.6 cm LEFT ATRIUM             Index        RIGHT ATRIUM           Index LA diam:        4.30 cm 2.71 cm/m   RA Area:     24.00 cm LA Vol (A2C):   61.4 ml 38.64 ml/m  RA Volume:   73.20 ml  46.07 ml/m LA Vol (A4C):   52.7 ml 33.17 ml/m LA Biplane Vol: 58.4 ml 36.76 ml/m  AORTIC VALVE AV Area (Vmax):    1.67 cm AV  Area (Vmean):   1.49 cm AV Area (VTI):     1.63 cm AV Vmax:           140.00 cm/s AV Vmean:          98.100 cm/s AV VTI:            0.358 m AV Peak Grad:      7.8 mmHg AV Mean Grad:      4.0 mmHg LVOT Vmax:         82.50 cm/s LVOT Vmean:        51.700 cm/s LVOT VTI:          0.206 m LVOT/AV VTI ratio: 0.58  AORTA Ao Root diam: 3.10 cm Ao Asc diam:  3.30 cm TRICUSPID VALVE TR Peak grad:   36.5 mmHg TR Vmax:        302.00 cm/s  SHUNTS Systemic VTI:  0.21 m Systemic Diam: 1.90 cm Eleonore Chiquito MD Electronically signed by Eleonore Chiquito MD Signature Date/Time: 09/04/2022/2:03:01 PM    Final      Assessment and Plan:   Acute on Chronic Diastolic Heart Failure - Patient presented complaining of SOB. BNP elevated to 420.7. CXR with subtle interstitial and patchy  airspace opacity of the right lung base, may reflect atelectasis vs infection  - Echo this admission with EF 50-55%, moderate LVH, moderate-severe mitral valve regurgitation  - Patient treated with rocephin and azithromycin for CAP  - Also treated with IV lasix-- patient output 2.75 L urine yesterday and is currently net -9.9 L since admission  - Patient reports that her dry weight is around 130-131 lbs. Today, weight down to 132.7 lbs. Reports significant improvement in symptoms today  - Suspect patient's moderate-severe MR will continue to cause patient to retain fluid, needs to be on diuresis at home to maintain normal volume  - Transition to PO lasix 40 mg daily tomorrow (home dose)  - Start aldactone 25 mg daily  - Continue BP management with diltiazem, metoprolol, diuretics   Moderate-Severe Mitral Valve Regurgitation  - Noted on echocardiogram this admission - Previously had moderate MR on echo in 11/2021 - Given patient's advanced age, will manage medically with diuretics as above   Chronic Atrial Fibrillation  - Rate is well controlled - Continue metoprolol tartrate 100 mg BID, diltiazem 60 mg TID  - Patient currently on eliquis 5 mg BID-- Consider decreasing dose to 2.5 mg BID (patient is 88 years old, weight is 60 kg), will discuss with MD   Otherwise per primary  - CAP - UTI  - Leukocytosis  - History of CVA  Risk Assessment/Risk Scores:   New York Heart Association (NYHA) Functional Class NYHA Class III  CHA2DS2-VASc Score = 8   This indicates a 10.8% annual risk of stroke. The patient's score is based upon: CHF History: 1 HTN History: 1 Diabetes History: 0 Stroke History: 2 Vascular Disease History: 1 Age Score: 2 Gender Score: 1       For questions or updates, please contact [place] Please consult [email or web address] for contact info under    Signed, Margie Billet, PA-C  09/07/2022 4:02 PM

## 2022-09-07 NOTE — Progress Notes (Signed)
PROGRESS NOTE    Adrienne [name]  DDU:[id number] DOB: 1927/09/13 DOA: 09/03/2022 PCP: Kathyrn Lass, MD   Brief Narrative: 88 year old with past medical history significant for hypertension, hyperlipidemia, A-fib, CAD, pulmonary hypertension, CKD, breast cancer status postradiation, GERD presented complaining of acute onset of shortness of breath.  With EMS she was noted to be tachypneic and wheezing.  She received DuoNebs and IV Solu-Medrol.  She was noted to be tachypneic, oxygen saturation 86 improved from BiPAP.  Chest x-ray noticed subtle interstitial and patchy our space opacity in the right lung concerning for atelectasis versus infection.  She has been weaned off of BiPAP.  He has been receiving treatment for pneumonia, IV Lasix for acute on chronic diastolic heart failure.   Assessment & Plan:   Active Problems:   Acute respiratory failure with hypoxia (HCC)   CAP (community acquired pneumonia)   Heart failure with preserved ejection fraction (HCC)   Complicated urinary tract infection   Leukocytosis   Chronic atrial fibrillation (HCC)   Pulmonary HTN ([place])   Essential hypertension   DNR (do not resuscitate) discussion  1-Acute Hypoxic Respiratory Failure secondary to pneumonia and heart failure exacerbation Initially required BiPAP, for oxygen saturation 86 and tachypnea. Influenza, COVID and RSV negative. Respiratory  panel negative.  Strep pneumonia negative and Legionella pending On Rocephin and azithromycin.  Change Rocephin to Zosyn to cover for UTI Pseudomona Improved with IV Lasix's, will continue with IV Lasix  2-Acute on chronic diastolic heart failure Pulmonary hypertension Mitral regurgitation BNP  elevated, she improve with IV Lasix. ECHO-EF: 50 to 55% with moderate LVH of basal septal segment. Left ventricle demonstrated global hypokinesis. Moderate to severe MR  Continue with IV lasix.  Will consult cardiology   3-UTI,  Patient has chronic  indwelling Foley catheter with history of uterine prolapse. Urine culture growing Pseudomonas, antibiotics changed to Zosyn  4-Leukocytosis, in the setting of UTI and possible pneumonia.  Afebrile  5-Chronic A-fib on chronic anticoagulation: Continue Eliquis, Cardizem and metoprolol  6-Hypertension: Continue metoprolol and Cardizem  7-History of CVA: Continue statin and Eliquis  8-CKD stage IIIa: Stable monitor.  9-History of breast cancer: Outpatient follow-up with oncology 10-Hyperlipidemia: Continue with atorvastatin 11-GERD: Continue with PPI. 12-Foot pain, suspect gout. Start colchicine. Uric acid 7.3. start allopurinol.       Estimated body mass index is 25.92 kg/m as calculated from the following:   Height as of this encounter: 5' (1.524 m).   Weight as of this encounter: 60.2 kg.   DVT prophylaxis: Eliquis Code Status: Full code Family Communication: Care discussed with patient and Husband at bedside.  Disposition Plan:  Status is: Inpatient Remains inpatient appropriate because: management of resp failure.     Consultants:  None  Procedures:  ECHO  Antimicrobials:    Subjective: She is breathing better after getting IV lasix.  Denies chest pain. She is complaining of foot pain.    Objective: Vitals:   09/06/22 1446 09/06/22 2054 09/07/22 0434 09/07/22 0757  BP: (!) 140/64 (!) 147/63 (!) 152/70 (!) 148/69  Pulse: 66 76 66 85  Resp: '18 17 18 17  '$ Temp: 98.6 F (37 C) 98.3 F (36.8 C) 98.2 F (36.8 C) 97.9 F (36.6 C)  TempSrc: Oral Oral Oral Oral  SpO2: 95% 100% 99% 97%  Weight:   60.2 kg   Height:        Intake/Output Summary (Last 24 hours) at 09/07/2022 1441 Last data filed at 09/07/2022 1108 Gross per 24 hour  Intake 880 ml  Output 2150 ml  Net -1270 ml   Filed Weights   09/04/22 0500 09/05/22 0608 09/07/22 0434  Weight: 62.1 kg 60.5 kg 60.2 kg    Examination:  General exam: Appears calm and comfortable  Respiratory system:  Crackles at bases.  Cardiovascular system: S1 & S2 heard, RRR.  Gastrointestinal system: Abdomen is nondistended, soft and nontender. No organomegaly or masses felt. Normal bowel sounds heard. Central nervous system: Alert and oriented.  Extremities: Symmetric 5 x 5 power.    Data Reviewed: I have personally reviewed following labs and imaging studies  CBC: Recent Labs  Lab 09/03/22 0850 09/03/22 0856 09/04/22 0232 09/05/22 0242 09/06/22 0337 09/07/22 0324  WBC 16.2*  --  17.1* 19.4* 17.4* 10.6*  NEUTROABS 13.1*  --   --   --   --   --   HGB 13.1 13.6 11.0* 11.7* 12.1 11.3*  HCT 39.5 40.0 32.8* 35.9* 37.1 33.3*  MCV 93.4  --  92.9 94.2 93.9 92.0  PLT 219  --  182 206 221 096   Basic Metabolic Panel: Recent Labs  Lab 09/03/22 0850 09/03/22 0856 09/04/22 0232 09/04/22 0836 09/05/22 0242 09/06/22 0337 09/07/22 0324  NA 137 136 137  --  137 136 136  K 3.5 3.5 3.8  --  4.5 4.2 3.8  CL 99  --  100  --  101 98 97*  CO2 28  --  27  --  '26 27 31  '$ GLUCOSE 149*  --  150*  --  104* 86 85  BUN 21  --  25*  --  32* 26* 22  CREATININE 1.08*  --  1.07*  --  1.05* 0.92 0.94  CALCIUM 9.4  --  8.9  --  8.9 9.0 8.6*  MG 1.7  --   --  2.0 2.1 2.0  --    GFR: Estimated Creatinine Clearance: 31 mL/min (by C-G formula based on SCr of 0.94 mg/dL). Liver Function Tests: Recent Labs  Lab 09/03/22 0850 09/06/22 0337  AST 25 19  ALT 17 16  ALKPHOS 85 73  BILITOT 0.8 0.7  PROT 7.3 6.4*  ALBUMIN 3.9 3.2*   No results for input(s): "LIPASE", "AMYLASE" in the last 168 hours. No results for input(s): "AMMONIA" in the last 168 hours. Coagulation Profile: No results for input(s): "INR", "PROTIME" in the last 168 hours. Cardiac Enzymes: No results for input(s): "CKTOTAL", "CKMB", "CKMBINDEX", "TROPONINI" in the last 168 hours. BNP (last 3 results) No results for input(s): "PROBNP" in the last 8760 hours. HbA1C: No results for input(s): "HGBA1C" in the last 72 hours. CBG: No results  for input(s): "GLUCAP" in the last 168 hours. Lipid Profile: No results for input(s): "CHOL", "HDL", "LDLCALC", "TRIG", "CHOLHDL", "LDLDIRECT" in the last 72 hours. Thyroid Function Tests: No results for input(s): "TSH", "T4TOTAL", "FREET4", "T3FREE", "THYROIDAB" in the last 72 hours. Anemia Panel: No results for input(s): "VITAMINB12", "FOLATE", "FERRITIN", "TIBC", "IRON", "RETICCTPCT" in the last 72 hours. Sepsis Labs: Recent Labs  Lab 09/03/22 0859  PROCALCITON <0.10    Recent Results (from the past 240 hour(s))  Resp panel by RT-PCR (RSV, Flu A&B, Covid) Anterior Nasal Swab     Status: None   Collection Time: 09/03/22  8:50 AM   Specimen: Anterior Nasal Swab  Result Value Ref Range Status   SARS Coronavirus 2 by RT PCR NEGATIVE NEGATIVE Final    Comment: (NOTE) SARS-CoV-2 target nucleic acids are NOT DETECTED.  The SARS-CoV-2 RNA is  generally detectable in upper respiratory specimens during the acute phase of infection. The lowest concentration of SARS-CoV-2 viral copies this assay can detect is 138 copies/mL. A negative result does not preclude SARS-Cov-2 infection and should not be used as the sole basis for treatment or other patient management decisions. A negative result may occur with  improper specimen collection/handling, submission of specimen other than nasopharyngeal swab, presence of viral mutation(s) within the areas targeted by this assay, and inadequate number of viral copies(<138 copies/mL). A negative result must be combined with clinical observations, patient history, and epidemiological information. The expected result is Negative.  Fact Sheet for Patients:  EntrepreneurPulse.com.au  Fact Sheet for Healthcare Providers:  IncredibleEmployment.be  This test is no t yet approved or cleared by the [place] FDA and  has been authorized for detection and/or diagnosis of SARS-CoV-2 by FDA under an Emergency Use  Authorization (EUA). This EUA will remain  in effect (meaning this test can be used) for the duration of the COVID-19 declaration under Section 564(b)(1) of the Act, 21 U.S.C.section 360bbb-3(b)(1), unless the authorization is terminated  or revoked sooner.       Influenza A by PCR NEGATIVE NEGATIVE Final   Influenza B by PCR NEGATIVE NEGATIVE Final    Comment: (NOTE) The Xpert Xpress SARS-CoV-2/FLU/RSV plus assay is intended as an aid in the diagnosis of influenza from Nasopharyngeal swab specimens and should not be used as a sole basis for treatment. Nasal washings and aspirates are unacceptable for Xpert Xpress SARS-CoV-2/FLU/RSV testing.  Fact Sheet for Patients: EntrepreneurPulse.com.au  Fact Sheet for Healthcare Providers: IncredibleEmployment.be  This test is not yet approved or cleared by the [place] FDA and has been authorized for detection and/or diagnosis of SARS-CoV-2 by FDA under an Emergency Use Authorization (EUA). This EUA will remain in effect (meaning this test can be used) for the duration of the COVID-19 declaration under Section 564(b)(1) of the Act, 21 U.S.C. section 360bbb-3(b)(1), unless the authorization is terminated or revoked.     Resp Syncytial Virus by PCR NEGATIVE NEGATIVE Final    Comment: (NOTE) Fact Sheet for Patients: EntrepreneurPulse.com.au  Fact Sheet for Healthcare Providers: IncredibleEmployment.be  This test is not yet approved or cleared by the [place] FDA and has been authorized for detection and/or diagnosis of SARS-CoV-2 by FDA under an Emergency Use Authorization (EUA). This EUA will remain in effect (meaning this test can be used) for the duration of the COVID-19 declaration under Section 564(b)(1) of the Act, 21 U.S.C. section 360bbb-3(b)(1), unless the authorization is terminated or revoked.  Performed at [facility], [place]  [address] [place], [place] [address]   Urine Culture     Status: Abnormal   Collection Time: 09/03/22 10:02 AM   Specimen: Urine, Clean Catch  Result Value Ref Range Status   Specimen Description URINE, CLEAN CATCH  Final   Special Requests   Final    NONE Performed at [facility], [place] [address], [place], [place] [address]    Culture 60,000 COLONIES/mL PSEUDOMONAS AERUGINOSA (A)  Final   Report Status 09/07/2022 FINAL  Final   Organism ID, Bacteria PSEUDOMONAS AERUGINOSA (A)  Final      Susceptibility   Pseudomonas aeruginosa - MIC*    CEFTAZIDIME 4 SENSITIVE Sensitive     CIPROFLOXACIN <=0.25 SENSITIVE Sensitive     GENTAMICIN <=1 SENSITIVE Sensitive     IMIPENEM 2 SENSITIVE Sensitive     PIP/TAZO 8 SENSITIVE Sensitive  CEFEPIME 2 SENSITIVE Sensitive     * 60,000 COLONIES/mL PSEUDOMONAS AERUGINOSA  Blood culture (routine x 2)     Status: None (Preliminary result)   Collection Time: 09/03/22 10:15 AM   Specimen: BLOOD LEFT HAND  Result Value Ref Range Status   Specimen Description BLOOD LEFT HAND  Final   Special Requests   Final    BOTTLES DRAWN AEROBIC AND ANAEROBIC Blood Culture adequate volume   Culture   Final    NO GROWTH 3 DAYS Performed at [facility], [place] [address], [place], [place] [address]    Report Status PENDING  Incomplete  Blood culture (routine x 2)     Status: None (Preliminary result)   Collection Time: 09/03/22 10:25 AM   Specimen: BLOOD RIGHT HAND  Result Value Ref Range Status   Specimen Description BLOOD RIGHT HAND  Final   Special Requests   Final    BOTTLES DRAWN AEROBIC AND ANAEROBIC Blood Culture adequate volume   Culture   Final    NO GROWTH 3 DAYS Performed at [facility], [place] [address] [place], [place] [address]    Report Status PENDING  Incomplete  Respiratory (~20 pathogens) panel by PCR     Status: None   Collection Time: 09/04/22  6:00 AM   Specimen: Nasopharyngeal Swab; Respiratory  Result Value Ref Range  Status   Adenovirus NOT DETECTED NOT DETECTED Final   Coronavirus 229E NOT DETECTED NOT DETECTED Final    Comment: (NOTE) The Coronavirus on the Respiratory Panel, DOES NOT test for the novel  Coronavirus (2019 nCoV)    Coronavirus HKU1 NOT DETECTED NOT DETECTED Final   Coronavirus NL63 NOT DETECTED NOT DETECTED Final   Coronavirus OC43 NOT DETECTED NOT DETECTED Final   Metapneumovirus NOT DETECTED NOT DETECTED Final   Rhinovirus / Enterovirus NOT DETECTED NOT DETECTED Final   Influenza A NOT DETECTED NOT DETECTED Final   Influenza B NOT DETECTED NOT DETECTED Final   Parainfluenza Virus 1 NOT DETECTED NOT DETECTED Final   Parainfluenza Virus 2 NOT DETECTED NOT DETECTED Final   Parainfluenza Virus 3 NOT DETECTED NOT DETECTED Final   Parainfluenza Virus 4 NOT DETECTED NOT DETECTED Final   Respiratory Syncytial Virus NOT DETECTED NOT DETECTED Final   Bordetella pertussis NOT DETECTED NOT DETECTED Final   Bordetella Parapertussis NOT DETECTED NOT DETECTED Final   Chlamydophila pneumoniae NOT DETECTED NOT DETECTED Final   Mycoplasma pneumoniae NOT DETECTED NOT DETECTED Final    Comment: Performed at [facility], [place]. [address], [place],  [address]         Radiology Studies: No results found.      Scheduled Meds:  acidophilus  1 capsule Oral Daily   apixaban  5 mg Oral BID   atorvastatin  10 mg Oral Daily   azithromycin  500 mg Oral Daily   Chlorhexidine Gluconate Cloth  6 each Topical Daily   colchicine  0.6 mg Oral Daily   diltiazem  60 mg Oral TID   metoprolol tartrate  100 mg Oral BID   pantoprazole  40 mg Oral Daily   potassium chloride SA  20 mEq Oral Daily   Continuous Infusions:  piperacillin-tazobactam (ZOSYN)  IV       LOS: 4 days    Time spent: 35 minutes    Jhordyn Hoopingarner A Jae Bruck, MD [facility]   If 7PM-7AM, please contact night-coverage [email or web address]  09/07/2022, 2:41 PM

## 2022-09-07 NOTE — Progress Notes (Signed)
Physical Therapy Treatment Patient Details Name: Adrienne [name] MRN: [id number] DOB: 06/22/1928 Today's Date: 09/07/2022   History of Present Illness Adrienne [name] is a 88 y.o. female who presents with complaints of SOB. PMH: hypertension, hyperlipidemia, atrial fibrillation, CAD, pulmonary hypertension, CKD, breast cancer s/p radiation, and GERD    PT Comments    Pt received in supine, agreeable to therapy session with emphasis on transfer and gait training while monitoring activity tolerance and O2 saturations. Pt SpO2 WFL on 2L O2 [place] throughout however heart monitor reading HR up to 144 bpm with exertion (ambulation), pt reports history of Afib and asymptomatic during gait trial but HR appears less controlled than pt baseline. RN/MD notified. Pt would benefit from initial HHPT upon DC due to needing increased assist with transfers and pt with new onset gout pain limiting standing/gait tolerance compared with baseline, discussed with supervising PT Ashly C. Pt spouse unable to physically lift her but her daughter can assist at times. Pt continues to benefit from PT services to progress toward functional mobility goals.   Recommendations for follow up therapy are one component of a multi-disciplinary discharge planning process, led by the attending physician.  Recommendations may be updated based on patient status, additional functional criteria and insurance authorization.  Follow Up Recommendations  Home health PT     Assistance Recommended at Discharge Intermittent Supervision/Assistance  Patient can return home with the following A little help with bathing/dressing/bathroom;Assist for transportation;Help with stairs or ramp for entrance;A little help with walking and/or transfers   Equipment Recommendations  None recommended by PT (has rollator)    Recommendations for Other Services       Precautions / Restrictions Precautions Precautions: Fall Precaution  Comments: new onset gout in her R foot Restrictions Weight Bearing Restrictions: No     Mobility  Bed Mobility Overal bed mobility: Needs Assistance Bed Mobility: Supine to Sit     Supine to sit: Supervision     General bed mobility comments: increased time, use of bed features, increased effort.    Transfers Overall transfer level: Needs assistance Equipment used: Rolling walker (2 wheels) Transfers: Sit to/from Stand Sit to Stand: Min assist           General transfer comment: pt unable to stand with only min guard, needed minA for lift assist from EOB<>RW    Ambulation/Gait Ambulation/Gait assistance: Min guard Gait Distance (Feet): 135 Feet Assistive device: Rolling walker (2 wheels) Gait Pattern/deviations: Step-through pattern, Decreased stride length, Step-to pattern, Decreased weight shift to right Gait velocity: decreased due to R foot pain     General Gait Details: DOE 2/4, SpO2 ~97% on 2LO2 via [place], HR elevated in Afib 80's-140's bpm, RN notified; antalgic gait pattern due to R foot gout pain   Stairs             Wheelchair Mobility    Modified Rankin (Stroke Patients Only)       Balance Overall balance assessment: Mild deficits observed, not formally tested (for energy conservation/pain pt benefits from RW for mobility)                                          Cognition Arousal/Alertness: Awake/alert Behavior During Therapy: WFL for tasks assessed/performed Overall Cognitive Status: Within Functional Limits for tasks assessed  General Comments: pleasantly cooperative, needs min safety cues/to wait for PTA to organize lines and for need to keep O2 [place] donned for all OOB mobility.        Exercises Other Exercises Other Exercises: went over the incentive spirometer and flutter valve, pt pulling only ~569m on IS    General Comments General comments (skin integrity, edema,  etc.): foley catheter in place, needs to be emptied soon      Pertinent Vitals/Pain Pain Assessment Pain Assessment: Faces Faces Pain Scale: Hurts even more Pain Location: R foot Pain Descriptors / Indicators: Aching, Discomfort, Grimacing Pain Intervention(s): Limited activity within patient's tolerance, Monitored during session, Premedicated before session, Repositioned    Home Living                          Prior Function            PT Goals (current goals can now be found in the care plan section) Acute Rehab PT Goals Patient Stated Goal: home PT Goal Formulation: With patient/family Time For Goal Achievement: 09/18/22 Progress towards PT goals: Progressing toward goals    Frequency    Min 3X/week      PT Plan Discharge plan needs to be updated    Co-evaluation              AM-PAC PT "6 Clicks" Mobility   Outcome Measure  Help needed turning from your back to your side while in a flat bed without using bedrails?: None Help needed moving from lying on your back to sitting on the side of a flat bed without using bedrails?: A Little Help needed moving to and from a bed to a chair (including a wheelchair)?: A Little Help needed standing up from a chair using your arms (e.g., wheelchair or bedside chair)?: A Little Help needed to walk in hospital room?: A Little Help needed climbing 3-5 steps with a railing? : A Lot 6 Click Score: 18    End of Session Equipment Utilized During Treatment: Gait belt;Oxygen (2L O2 via [place]) Activity Tolerance: Patient limited by pain;Other (comment);Treatment limited secondary to medical complications (Comment) (R foot pain, notable Afib tachy to 140's bpm, RN notified, who notified MD also) Patient left: in bed;with call bell/phone within reach;with bed alarm set;Other (comment) (HOB at ~40 deg) Nurse Communication: Mobility status;Other (comment) (tachycardia) PT Visit Diagnosis: Unsteadiness on feet (R26.81);Muscle  weakness (generalized) (M62.81);Difficulty in walking, not elsewhere classified (R26.2)     Time: 1740-1806 PT Time Calculation (min) (ACUTE ONLY): 26 min  Charges:  $Gait Training: 8-22 mins $Therapeutic Activity: 8-22 mins                     Nollan Muldrow P., PTA Acute Rehabilitation Services Secure Chat Preferred 9a-5:30pm Office: [place]12/27/2023, 6:27 PM

## 2022-09-08 DIAGNOSIS — I340 Nonrheumatic mitral (valve) insufficiency: Secondary | ICD-10-CM | POA: Diagnosis not present

## 2022-09-08 DIAGNOSIS — I5031 Acute diastolic (congestive) heart failure: Secondary | ICD-10-CM | POA: Diagnosis not present

## 2022-09-08 DIAGNOSIS — I509 Heart failure, unspecified: Secondary | ICD-10-CM | POA: Diagnosis not present

## 2022-09-08 LAB — CBC
HCT: 31.5 % — ABNORMAL LOW (ref 36.0–46.0)
Hemoglobin: 10.8 g/dL — ABNORMAL LOW (ref 12.0–15.0)
MCH: 31.3 pg (ref 26.0–34.0)
MCHC: 34.3 g/dL (ref 30.0–36.0)
MCV: 91.3 fL (ref 80.0–100.0)
Platelets: 203 10*3/uL (ref 150–400)
RBC: 3.45 MIL/uL — ABNORMAL LOW (ref 3.87–5.11)
RDW: 13.2 % (ref 11.5–15.5)
WBC: 10.6 10*3/uL — ABNORMAL HIGH (ref 4.0–10.5)
nRBC: 0 % (ref 0.0–0.2)

## 2022-09-08 LAB — BASIC METABOLIC PANEL
Anion gap: 10 (ref 5–15)
BUN: 18 mg/dL (ref 8–23)
CO2: 31 mmol/L (ref 22–32)
Calcium: 8.2 mg/dL — ABNORMAL LOW (ref 8.9–10.3)
Chloride: 94 mmol/L — ABNORMAL LOW (ref 98–111)
Creatinine, Ser: 0.99 mg/dL (ref 0.44–1.00)
GFR, Estimated: 53 mL/min — ABNORMAL LOW (ref >60)
Glucose, Bld: 89 mg/dL (ref 70–99)
Potassium: 3.7 mmol/L (ref 3.5–5.1)
Sodium: 135 mmol/L (ref 135–145)

## 2022-09-08 LAB — CULTURE, BLOOD (ROUTINE X 2)
Culture: NO GROWTH
Culture: NO GROWTH
Special Requests: ADEQUATE
Special Requests: ADEQUATE

## 2022-09-08 MED ORDER — SPIRONOLACTONE 25 MG PO TABS: 25.0000 mg | ORAL_TABLET | Freq: Every day | ORAL | 0 refills | Status: DC

## 2022-09-08 MED ORDER — TRAMADOL HCL 50 MG PO TABS
25.0000 mg | ORAL_TABLET | Freq: Three times a day (TID) | ORAL | Status: DC | PRN
  Administered 2022-09-08: 25 mg via ORAL
  Filled 2022-09-08 (×3): qty 1

## 2022-09-08 MED ORDER — ALLOPURINOL 100 MG PO TABS: 100.0000 mg | ORAL_TABLET | Freq: Every day | ORAL | 1 refills | Status: AC

## 2022-09-08 MED ORDER — ALBUTEROL SULFATE HFA 108 (90 BASE) MCG/ACT IN AERS: 1.0000 | INHALATION_SPRAY | Freq: Four times a day (QID) | RESPIRATORY_TRACT | 2 refills | Status: AC | PRN

## 2022-09-08 MED ORDER — COLCHICINE 0.6 MG PO TABS: 0.6000 mg | ORAL_TABLET | Freq: Every day | ORAL | 0 refills | Status: DC

## 2022-09-08 MED ORDER — PREDNISONE 20 MG PO TABS
20.0000 mg | ORAL_TABLET | Freq: Every day | ORAL | Status: DC
  Administered 2022-09-08 – 2022-09-09 (×2): 20 mg via ORAL
  Filled 2022-09-08 (×2): qty 1

## 2022-09-08 NOTE — TOC Progression Note (Signed)
Transition of Care [facility]) - Progression Note    Patient Details  Name: Adrienne [name] MRN: [id number] Date of Birth: 1928/06/20  [facility] CM/SW Contact  Jacalyn Lefevre Edson Snowball, RN Phone Number: 09/08/2022, 10:15 AM  Clinical Narrative:     Spoke to patient and husband at bedside.   Home oxygen tank from [employer] already in room and they have [employer]'s number to call as soon as they get home.    Discussed home health PT . Patient in agreement. Offered choice no preference.   Referral given to and accepted by [facility] with [facility]  Expected Discharge Plan: Home/Self Care Barriers to Discharge: No Barriers Identified  Expected Discharge Plan and Services   Discharge Planning Services: CM Consult Post Acute Care Choice: Durable Medical Equipment   Expected Discharge Date: 09/08/22               DME Arranged: Oxygen DME Agency: [employer] Date DME Agency Contacted: 09/07/22 Time DME Agency Contacted: 1207 Representative spoke with at DME Agency: [place] Determinants of Health ([place]) Interventions SDOH Screenings   Tobacco Use: Medium Risk (09/03/2022)    Readmission Risk Interventions     No data to display

## 2022-09-08 NOTE — Progress Notes (Signed)
Mobility Specialist - Progress Note   09/08/22 1200  Mobility  Activity Ambulated with assistance in hallway  Level of Assistance Standby assist, set-up cues, supervision of patient - no hands on  Assistive Device Front wheel walker  Distance Ambulated (ft) 200 ft  Activity Response Tolerated well  Mobility Referral Yes  $Mobility charge 1 Mobility    Pt received in bed agreeable to mobility. C/o dizziness at EOS, resolved once sitting EOB. Left in bed w/ call bell within reach and all needs met.   Pre-mobility: HR 63 bpm During mobility: HR 74 bpm   [place] Specialist Please contact via Solicitor or Rehab office at [phone]

## 2022-09-08 NOTE — Progress Notes (Signed)
Cardiology Progress Note  Patient ID: Adrienne [name] MRN: [id number] DOB: [birth year] Date of Encounter: 09/08/2022  Primary Cardiologist: Candee Furbish, MD  Subjective   Chief Complaint: None.   HPI: Ready for discharge.  Breathing much improved.  ROS:  All other ROS reviewed and negative. Pertinent positives noted in the HPI.     Inpatient Medications  Scheduled Meds:  acidophilus  1 capsule Oral Daily   allopurinol  100 mg Oral Daily   apixaban  5 mg Oral BID   atorvastatin  10 mg Oral Daily   Chlorhexidine Gluconate Cloth  6 each Topical Daily   colchicine  0.6 mg Oral Daily   diltiazem  60 mg Oral TID   furosemide  40 mg Oral Daily   metoprolol tartrate  100 mg Oral BID   pantoprazole  40 mg Oral Daily   potassium chloride SA  20 mEq Oral Daily   spironolactone  25 mg Oral Daily   Continuous Infusions:  piperacillin-tazobactam (ZOSYN)  IV 3.375 g (09/08/22 0521)   PRN Meds: acetaminophen **OR** acetaminophen, levalbuterol   Vital Signs   Vitals:   09/07/22 2008 09/08/22 0017 09/08/22 0510 09/08/22 1012  BP: (!) 154/59 (!) 141/59 (!) 158/61 (!) 153/70  Pulse: 73 64 70 70  Resp: '19 20 20 16  '$ Temp: 98.7 F (37.1 C) 98.3 F (36.8 C) 98 F (36.7 C) 97.8 F (36.6 C)  TempSrc: Oral Oral Oral Oral  SpO2: 99% 99% 100% 98%  Weight:   59.2 kg   Height:        Intake/Output Summary (Last 24 hours) at 09/08/2022 1056 Last data filed at 09/08/2022 1044 Gross per 24 hour  Intake 339.95 ml  Output 2300 ml  Net -1960.05 ml      09/08/2022    5:10 AM 09/07/2022    4:34 AM 09/05/2022    6:08 AM  Last 3 Weights  Weight (lbs) 130 lb 8.2 oz 132 lb 11.5 oz 133 lb 6.1 oz  Weight (kg) 59.2 kg 60.2 kg 60.5 kg      Telemetry  Overnight telemetry shows Afib 50-70 bpm, which I personally reviewed.    Physical Exam   Vitals:   09/07/22 2008 09/08/22 0017 09/08/22 0510 09/08/22 1012  BP: (!) 154/59 (!) 141/59 (!) 158/61 (!) 153/70  Pulse: 73 64 70 70   Resp: '19 20 20 16  '$ Temp: 98.7 F (37.1 C) 98.3 F (36.8 C) 98 F (36.7 C) 97.8 F (36.6 C)  TempSrc: Oral Oral Oral Oral  SpO2: 99% 99% 100% 98%  Weight:   59.2 kg   Height:        Intake/Output Summary (Last 24 hours) at 09/08/2022 1056 Last data filed at 09/08/2022 1044 Gross per 24 hour  Intake 339.95 ml  Output 2300 ml  Net -1960.05 ml       09/08/2022    5:10 AM 09/07/2022    4:34 AM 09/05/2022    6:08 AM  Last 3 Weights  Weight (lbs) 130 lb 8.2 oz 132 lb 11.5 oz 133 lb 6.1 oz  Weight (kg) 59.2 kg 60.2 kg 60.5 kg    Body mass index is 25.49 kg/m.  General: Well nourished, well developed, in no acute distress Head: Atraumatic, normal size  Eyes: PEERLA, EOMI  Neck: Supple, no JVD Endocrine: No thryomegaly Cardiac: Normal S1, S2; irregular rhythm Lungs: Clear to auscultation bilaterally, no wheezing, rhonchi or rales  Abd: Soft, nontender, no hepatomegaly  Ext: No edema, pulses 2+  Musculoskeletal: No deformities, BUE and BLE strength normal and equal Skin: Warm and dry, no rashes   Neuro: Alert and oriented to person, place, time, and situation, CNII-XII grossly intact, no focal deficits  Psych: Normal mood and affect   Labs  High Sensitivity Troponin:   Recent Labs  Lab 09/03/22 0850 09/03/22 1025  TROPONINIHS 9 12     Cardiac EnzymesNo results for input(s): "TROPONINI" in the last 168 hours. No results for input(s): "TROPIPOC" in the last 168 hours.  Chemistry Recent Labs  Lab 09/03/22 0850 09/03/22 0856 09/06/22 0337 09/07/22 0324 09/08/22 0150  NA 137   < > 136 136 135  K 3.5   < > 4.2 3.8 3.7  CL 99   < > 98 97* 94*  CO2 28   < > '27 31 31  '$ GLUCOSE 149*   < > 86 85 89  BUN 21   < > 26* 22 18  CREATININE 1.08*   < > 0.92 0.94 0.99  CALCIUM 9.4   < > 9.0 8.6* 8.2*  PROT 7.3  --  6.4*  --   --   ALBUMIN 3.9  --  3.2*  --   --   AST 25  --  19  --   --   ALT 17  --  16  --   --   ALKPHOS 85  --  73  --   --   BILITOT 0.8  --  0.7  --   --    GFRNONAA 48*   < > 58* 57* 53*  ANIONGAP 10   < > '11 8 10   '$ < > = values in this interval not displayed.    Hematology Recent Labs  Lab 09/06/22 0337 09/07/22 0324 09/08/22 0150  WBC 17.4* 10.6* 10.6*  RBC 3.95 3.62* 3.45*  HGB 12.1 11.3* 10.8*  HCT 37.1 33.3* 31.5*  MCV 93.9 92.0 91.3  MCH 30.6 31.2 31.3  MCHC 32.6 33.9 34.3  RDW 13.5 13.2 13.2  PLT 221 206 203   BNP Recent Labs  Lab 09/03/22 0850  BNP 420.7*    DDimer No results for input(s): "DDIMER" in the last 168 hours.   Radiology  No results found.  Cardiac Studies  TTE 09/04/2022  1. Left ventricular ejection fraction, by estimation, is 50 to 55%. The  left ventricle has low normal function. The left ventricle demonstrates  global hypokinesis. There is moderate left ventricular hypertrophy of the  basal-septal segment. Left  ventricular diastolic function could not be evaluated.   2. Right ventricular systolic function is mildly reduced. The right  ventricular size is moderately enlarged.   3. Left atrial size was mildly dilated.   4. Right atrial size was mild to moderately dilated.   5. The mitral valve is degenerative. Moderate to severe mitral valve  regurgitation. No evidence of mitral stenosis.   6. The aortic valve is tricuspid. Aortic valve regurgitation is not  visualized. Aortic valve sclerosis/calcification is present, without any  evidence of aortic stenosis.   Patient Profile  88 year old female with history of HFpEF, CAD, CVA, permanent atrial fibrillation, hypertension, hyperlipidemia who was admitted on 09/03/2022 for acute on chronic diastolic heart failure exacerbation.   Assessment & Plan   # Acute diastolic heart failure # Severe mitral valve regurgitation -Admitted with volume overload.  Has been diuresed well. -Transition to 40 mg of p.o. Lasix daily. -Would continue Aldactone 25 mg daily at discharge. -No SGLT2 inhibitor given UTI. -  She has atrial functional mitral valve  regurgitation.  Would recommend repeat echo in the outpatient setting given her good diuresis.  Likely will just benefit from medical therapy.  # Persistent atrial fibrillation -Continue diltiazem and Eliquis.  EF is normal.  [place] will sign off.   Medication Recommendations: As above Other recommendations (labs, testing, etc): None Follow up as an outpatient: We will arrange follow-up in 2 to 3 weeks with Dr. Marlou Porch  For questions or updates, please contact [place] Please consult [email or web address] for contact info under        Signed, [place] T. Audie Box, MD, [place]  09/08/2022 10:56 AM

## 2022-09-08 NOTE — Progress Notes (Signed)
PROGRESS NOTE    Adrienne [name]  JOA:[id number] DOB: Jun 04, 1928 DOA: 09/03/2022 PCP: Kathyrn Lass, MD   Brief Narrative: 88 year old with past medical history significant for hypertension, hyperlipidemia, A-fib, CAD, pulmonary hypertension, CKD, breast cancer status postradiation, GERD presented complaining of acute onset of shortness of breath.  With EMS she was noted to be tachypneic and wheezing.  She received DuoNebs and IV Solu-Medrol.  She was noted to be tachypneic, oxygen saturation 86 improved from BiPAP.  Chest x-ray noticed subtle interstitial and patchy our space opacity in the right lung concerning for atelectasis versus infection.  She has been weaned off of BiPAP.  He has been receiving treatment for pneumonia, IV Lasix for acute on chronic diastolic heart failure.   Assessment & Plan:   Active Problems:   Acute respiratory failure with hypoxia (HCC)   CAP (community acquired pneumonia)   Heart failure with preserved ejection fraction (HCC)   Complicated urinary tract infection   Leukocytosis   Chronic atrial fibrillation (HCC)   Pulmonary HTN ([place])   Essential hypertension   DNR (do not resuscitate) discussion  1-Acute Hypoxic Respiratory Failure secondary to pneumonia and heart failure exacerbation Initially required BiPAP, for oxygen saturation 86 and tachypnea. Influenza, COVID and RSV negative. Respiratory  panel negative.  Strep pneumonia negative and Legionella pending Completed azithromycin.  Change Rocephin to Zosyn to cover for UTI Pseudomona Improved with IV Lasix's, negative 11 L. Transition to oral lasix.   2-Acute on chronic diastolic heart failure Pulmonary hypertension Mitral regurgitation BNP  elevated, she improve with IV Lasix. ECHO-EF: 50 to 55% with moderate LVH of basal septal segment. Left ventricle demonstrated global hypokinesis. Moderate to severe MR  Transition to oral lasix.  Appreciate cardiology assistance. Follow up out  patient to repeat ECHO.   3-UTI,  Patient has chronic indwelling Foley catheter with history of uterine prolapse. Urine culture growing Pseudomonas, antibiotics changed to Zosyn  4-Leukocytosis, in the setting of UTI and possible pneumonia.  Afebrile  5-Chronic A-fib on chronic anticoagulation: Continue Eliquis, Cardizem and metoprolol  6-Hypertension: Continue metoprolol and Cardizem  7-History of CVA: Continue statin and Eliquis  8-CKD stage IIIa: Stable monitor.  9-History of breast cancer: Outpatient follow-up with oncology 10-Hyperlipidemia: Continue with atorvastatin 11-GERD: Continue with PPI. 12-Foot pain, suspect gout. Started  colchicine. Uric acid 7.3. start allopurinol.   Discharge cancel, patient develops left shoulder pain, doesn't feel she can go home. She will be right back with the pain. Plan to give her prednisone and tramadol.      Estimated body mass index is 25.49 kg/m as calculated from the following:   Height as of this encounter: 5' (1.524 m).   Weight as of this encounter: 59.2 kg.   DVT prophylaxis: Eliquis Code Status: Full code Family Communication: Care discussed with patient and Husband at bedside.  Disposition Plan:  Status is: Inpatient Remains inpatient appropriate because: management of resp failure.     Consultants:  None  Procedures:  ECHO  Antimicrobials:    Subjective: She was doing well this morning. Now develops shoulder pain   Objective: Vitals:   09/07/22 2008 09/08/22 0017 09/08/22 0510 09/08/22 1012  BP: (!) 154/59 (!) 141/59 (!) 158/61 (!) 153/70  Pulse: 73 64 70 70  Resp: '19 20 20 16  '$ Temp: 98.7 F (37.1 C) 98.3 F (36.8 C) 98 F (36.7 C) 97.8 F (36.6 C)  TempSrc: Oral Oral Oral Oral  SpO2: 99% 99% 100% 98%  Weight:   59.2 kg  Height:        Intake/Output Summary (Last 24 hours) at 09/08/2022 1325 Last data filed at 09/08/2022 1044 Gross per 24 hour  Intake 339.95 ml  Output 1650 ml  Net -1310.05  ml    Filed Weights   09/05/22 0608 09/07/22 0434 09/08/22 0510  Weight: 60.5 kg 60.2 kg 59.2 kg    Examination:  General exam: NAD Respiratory system: CTA Cardiovascular system: S 1, S 2 RRR Gastrointestinal system: BS present, soft.  nt Central nervous system: alert Extremities: Symmetric 5 x 5 power.    Data Reviewed: I have personally reviewed following labs and imaging studies  CBC: Recent Labs  Lab 09/03/22 0850 09/03/22 0856 09/04/22 0232 09/05/22 0242 09/06/22 0337 09/07/22 0324 09/08/22 0150  WBC 16.2*  --  17.1* 19.4* 17.4* 10.6* 10.6*  NEUTROABS 13.1*  --   --   --   --   --   --   HGB 13.1   < > 11.0* 11.7* 12.1 11.3* 10.8*  HCT 39.5   < > 32.8* 35.9* 37.1 33.3* 31.5*  MCV 93.4  --  92.9 94.2 93.9 92.0 91.3  PLT 219  --  182 206 221 206 203   < > = values in this interval not displayed.    Basic Metabolic Panel: Recent Labs  Lab 09/03/22 0850 09/03/22 0856 09/04/22 0232 09/04/22 0836 09/05/22 0242 09/06/22 0337 09/07/22 0324 09/08/22 0150  NA 137   < > 137  --  137 136 136 135  K 3.5   < > 3.8  --  4.5 4.2 3.8 3.7  CL 99  --  100  --  101 98 97* 94*  CO2 28  --  27  --  '26 27 31 31  '$ GLUCOSE 149*  --  150*  --  104* 86 85 89  BUN 21  --  25*  --  32* 26* 22 18  CREATININE 1.08*  --  1.07*  --  1.05* 0.92 0.94 0.99  CALCIUM 9.4  --  8.9  --  8.9 9.0 8.6* 8.2*  MG 1.7  --   --  2.0 2.1 2.0  --   --    < > = values in this interval not displayed.    GFR: Estimated Creatinine Clearance: 29.2 mL/min (by C-G formula based on SCr of 0.99 mg/dL). Liver Function Tests: Recent Labs  Lab 09/03/22 0850 09/06/22 0337  AST 25 19  ALT 17 16  ALKPHOS 85 73  BILITOT 0.8 0.7  PROT 7.3 6.4*  ALBUMIN 3.9 3.2*    No results for input(s): "LIPASE", "AMYLASE" in the last 168 hours. No results for input(s): "AMMONIA" in the last 168 hours. Coagulation Profile: No results for input(s): "INR", "PROTIME" in the last 168 hours. Cardiac Enzymes: No  results for input(s): "CKTOTAL", "CKMB", "CKMBINDEX", "TROPONINI" in the last 168 hours. BNP (last 3 results) No results for input(s): "PROBNP" in the last 8760 hours. HbA1C: No results for input(s): "HGBA1C" in the last 72 hours. CBG: No results for input(s): "GLUCAP" in the last 168 hours. Lipid Profile: No results for input(s): "CHOL", "HDL", "LDLCALC", "TRIG", "CHOLHDL", "LDLDIRECT" in the last 72 hours. Thyroid Function Tests: No results for input(s): "TSH", "T4TOTAL", "FREET4", "T3FREE", "THYROIDAB" in the last 72 hours. Anemia Panel: No results for input(s): "VITAMINB12", "FOLATE", "FERRITIN", "TIBC", "IRON", "RETICCTPCT" in the last 72 hours. Sepsis Labs: Recent Labs  Lab 09/03/22 0859  PROCALCITON <0.10     Recent Results (from the past  240 hour(s))  Resp panel by RT-PCR (RSV, Flu A&B, Covid) Anterior Nasal Swab     Status: None   Collection Time: 09/03/22  8:50 AM   Specimen: Anterior Nasal Swab  Result Value Ref Range Status   SARS Coronavirus 2 by RT PCR NEGATIVE NEGATIVE Final    Comment: (NOTE) SARS-CoV-2 target nucleic acids are NOT DETECTED.  The SARS-CoV-2 RNA is generally detectable in upper respiratory specimens during the acute phase of infection. The lowest concentration of SARS-CoV-2 viral copies this assay can detect is 138 copies/mL. A negative result does not preclude SARS-Cov-2 infection and should not be used as the sole basis for treatment or other patient management decisions. A negative result may occur with  improper specimen collection/handling, submission of specimen other than nasopharyngeal swab, presence of viral mutation(s) within the areas targeted by this assay, and inadequate number of viral copies(<138 copies/mL). A negative result must be combined with clinical observations, patient history, and epidemiological information. The expected result is Negative.  Fact Sheet for Patients:  EntrepreneurPulse.com.au  Fact  Sheet for Healthcare Providers:  IncredibleEmployment.be  This test is no t yet approved or cleared by the [place] FDA and  has been authorized for detection and/or diagnosis of SARS-CoV-2 by FDA under an Emergency Use Authorization (EUA). This EUA will remain  in effect (meaning this test can be used) for the duration of the COVID-19 declaration under Section 564(b)(1) of the Act, 21 U.S.C.section 360bbb-3(b)(1), unless the authorization is terminated  or revoked sooner.       Influenza A by PCR NEGATIVE NEGATIVE Final   Influenza B by PCR NEGATIVE NEGATIVE Final    Comment: (NOTE) The Xpert Xpress SARS-CoV-2/FLU/RSV plus assay is intended as an aid in the diagnosis of influenza from Nasopharyngeal swab specimens and should not be used as a sole basis for treatment. Nasal washings and aspirates are unacceptable for Xpert Xpress SARS-CoV-2/FLU/RSV testing.  Fact Sheet for Patients: EntrepreneurPulse.com.au  Fact Sheet for Healthcare Providers: IncredibleEmployment.be  This test is not yet approved or cleared by the [place] FDA and has been authorized for detection and/or diagnosis of SARS-CoV-2 by FDA under an Emergency Use Authorization (EUA). This EUA will remain in effect (meaning this test can be used) for the duration of the COVID-19 declaration under Section 564(b)(1) of the Act, 21 U.S.C. section 360bbb-3(b)(1), unless the authorization is terminated or revoked.     Resp Syncytial Virus by PCR NEGATIVE NEGATIVE Final    Comment: (NOTE) Fact Sheet for Patients: EntrepreneurPulse.com.au  Fact Sheet for Healthcare Providers: IncredibleEmployment.be  This test is not yet approved or cleared by the [place] FDA and has been authorized for detection and/or diagnosis of SARS-CoV-2 by FDA under an Emergency Use Authorization (EUA). This EUA will remain in effect  (meaning this test can be used) for the duration of the COVID-19 declaration under Section 564(b)(1) of the Act, 21 U.S.C. section 360bbb-3(b)(1), unless the authorization is terminated or revoked.  Performed at [facility], [place] [address], [place], [place] [address]   Urine Culture     Status: Abnormal   Collection Time: 09/03/22 10:02 AM   Specimen: Urine, Clean Catch  Result Value Ref Range Status   Specimen Description URINE, CLEAN CATCH  Final   Special Requests   Final    NONE Performed at [facility], [place] [address], [place], [place] [address]    Culture 60,000 COLONIES/mL PSEUDOMONAS AERUGINOSA (A)  Final   Report Status 09/07/2022  FINAL  Final   Organism ID, Bacteria PSEUDOMONAS AERUGINOSA (A)  Final      Susceptibility   Pseudomonas aeruginosa - MIC*    CEFTAZIDIME 4 SENSITIVE Sensitive     CIPROFLOXACIN <=0.25 SENSITIVE Sensitive     GENTAMICIN <=1 SENSITIVE Sensitive     IMIPENEM 2 SENSITIVE Sensitive     PIP/TAZO 8 SENSITIVE Sensitive     CEFEPIME 2 SENSITIVE Sensitive     * 60,000 COLONIES/mL PSEUDOMONAS AERUGINOSA  Blood culture (routine x 2)     Status: None   Collection Time: 09/03/22 10:15 AM   Specimen: BLOOD LEFT HAND  Result Value Ref Range Status   Specimen Description BLOOD LEFT HAND  Final   Special Requests   Final    BOTTLES DRAWN AEROBIC AND ANAEROBIC Blood Culture adequate volume   Culture   Final    NO GROWTH 5 DAYS Performed at [facility], [address], [place], [place] [address]    Report Status 09/08/2022 FINAL  Final  Blood culture (routine x 2)     Status: None   Collection Time: 09/03/22 10:25 AM   Specimen: BLOOD RIGHT HAND  Result Value Ref Range Status   Specimen Description BLOOD RIGHT HAND  Final   Special Requests   Final    BOTTLES DRAWN AEROBIC AND ANAEROBIC Blood Culture adequate volume   Culture   Final    NO GROWTH 5 DAYS Performed at [facility], [place] [address], [place], [place] [address]     Report Status 09/08/2022 FINAL  Final  Respiratory (~20 pathogens) panel by PCR     Status: None   Collection Time: 09/04/22  6:00 AM   Specimen: Nasopharyngeal Swab; Respiratory  Result Value Ref Range Status   Adenovirus NOT DETECTED NOT DETECTED Final   Coronavirus 229E NOT DETECTED NOT DETECTED Final    Comment: (NOTE) The Coronavirus on the Respiratory Panel, DOES NOT test for the novel  Coronavirus (2019 nCoV)    Coronavirus HKU1 NOT DETECTED NOT DETECTED Final   Coronavirus NL63 NOT DETECTED NOT DETECTED Final   Coronavirus OC43 NOT DETECTED NOT DETECTED Final   Metapneumovirus NOT DETECTED NOT DETECTED Final   Rhinovirus / Enterovirus NOT DETECTED NOT DETECTED Final   Influenza A NOT DETECTED NOT DETECTED Final   Influenza B NOT DETECTED NOT DETECTED Final   Parainfluenza Virus 1 NOT DETECTED NOT DETECTED Final   Parainfluenza Virus 2 NOT DETECTED NOT DETECTED Final   Parainfluenza Virus 3 NOT DETECTED NOT DETECTED Final   Parainfluenza Virus 4 NOT DETECTED NOT DETECTED Final   Respiratory Syncytial Virus NOT DETECTED NOT DETECTED Final   Bordetella pertussis NOT DETECTED NOT DETECTED Final   Bordetella Parapertussis NOT DETECTED NOT DETECTED Final   Chlamydophila pneumoniae NOT DETECTED NOT DETECTED Final   Mycoplasma pneumoniae NOT DETECTED NOT DETECTED Final    Comment: Performed at [facility], [place]. [address] [place],  [address]         Radiology Studies: No results found.      Scheduled Meds:  acidophilus  1 capsule Oral Daily   allopurinol  100 mg Oral Daily   apixaban  5 mg Oral BID   atorvastatin  10 mg Oral Daily   Chlorhexidine Gluconate Cloth  6 each Topical Daily   colchicine  0.6 mg Oral Daily   diltiazem  60 mg Oral TID   furosemide  40 mg Oral Daily   metoprolol tartrate  100 mg  Oral BID   pantoprazole  40 mg Oral Daily   potassium chloride SA  20 mEq Oral Daily   predniSONE  20 mg Oral Q breakfast   spironolactone  25 mg  Oral Daily   Continuous Infusions:  piperacillin-tazobactam (ZOSYN)  IV 3.375 g (09/08/22 0521)     LOS: 5 days    Time spent: 35 minutes    Grayer Sproles A Shahad Mazurek, MD [facility]   If 7PM-7AM, please contact night-coverage [email or web address]  09/08/2022, 1:25 PM

## 2022-09-08 NOTE — Progress Notes (Signed)
Foley catheter exchanged at this time per Dr Tyrell Antonio. Pt has hx of chronic foley. 63m removed from previous foley balloon. Pt states that amount is usually used d/t history of balloon "falling out" with only 143m New foley insert with 1444mn balloon.

## 2022-09-09 DIAGNOSIS — I509 Heart failure, unspecified: Secondary | ICD-10-CM | POA: Diagnosis not present

## 2022-09-09 LAB — BASIC METABOLIC PANEL
Anion gap: 10 (ref 5–15)
BUN: 16 mg/dL (ref 8–23)
CO2: 30 mmol/L (ref 22–32)
Calcium: 8.9 mg/dL (ref 8.9–10.3)
Chloride: 95 mmol/L — ABNORMAL LOW (ref 98–111)
Creatinine, Ser: 0.91 mg/dL (ref 0.44–1.00)
GFR, Estimated: 59 mL/min — ABNORMAL LOW (ref >60)
Glucose, Bld: 105 mg/dL — ABNORMAL HIGH (ref 70–99)
Potassium: 3.9 mmol/L (ref 3.5–5.1)
Sodium: 135 mmol/L (ref 135–145)

## 2022-09-09 LAB — CBC
HCT: 32.1 % — ABNORMAL LOW (ref 36.0–46.0)
Hemoglobin: 11.2 g/dL — ABNORMAL LOW (ref 12.0–15.0)
MCH: 31.5 pg (ref 26.0–34.0)
MCHC: 34.9 g/dL (ref 30.0–36.0)
MCV: 90.4 fL (ref 80.0–100.0)
Platelets: 232 10*3/uL (ref 150–400)
RBC: 3.55 MIL/uL — ABNORMAL LOW (ref 3.87–5.11)
RDW: 12.6 % (ref 11.5–15.5)
WBC: 12 10*3/uL — ABNORMAL HIGH (ref 4.0–10.5)
nRBC: 0 % (ref 0.0–0.2)

## 2022-09-09 MED ORDER — TRAMADOL HCL 50 MG PO TABS: 25.0000 mg | ORAL_TABLET | Freq: Three times a day (TID) | ORAL | 0 refills | Status: AC | PRN

## 2022-09-09 MED ORDER — PREDNISONE 20 MG PO TABS: 20.0000 mg | ORAL_TABLET | Freq: Every day | ORAL | 0 refills | Status: DC

## 2022-09-09 NOTE — Progress Notes (Signed)
Physical Therapy Treatment Patient Details Name: Adrienne [name] MRN: [id number] DOB: Jul 22, 1928 Today's Date: 09/09/2022   History of Present Illness Adrienne [name] is a 88 y.o. female who presents with complaints of SOB. PMH: hypertension, hyperlipidemia, atrial fibrillation, CAD, pulmonary hypertension, CKD, breast cancer s/p radiation, and GERD    PT Comments    Pt has improved from last physical therapy session with decreased pain in the R foot. She continues with shortness of breathe with gait with O2 sats remaining at 92% on room air but significant increase in heart rate most likely due to compensation. Pt is CGA to mod I for all activities and will benefit from skilled physical therapy services in HHPT setting on discharge from acute care hospital setting once medically cleared.    Recommendations for follow up therapy are one component of a multi-disciplinary discharge planning process, led by the attending physician.  Recommendations may be updated based on patient status, additional functional criteria and insurance authorization.  Follow Up Recommendations  Home health PT     Assistance Recommended at Discharge Intermittent Supervision/Assistance  Patient can return home with the following Assist for transportation;Help with stairs or ramp for entrance;A little help with walking and/or transfers;Assistance with cooking/housework   Equipment Recommendations  None recommended by PT    Recommendations for Other Services       Precautions / Restrictions Precautions Precautions: Fall Precaution Comments: new onset gout in her R foot though pt did not report pain today with gait or WB Restrictions Weight Bearing Restrictions: No     Mobility  Bed Mobility Overal bed mobility: Needs Assistance Bed Mobility: Sit to Supine     Supine to sit: Supervision Sit to supine: Supervision   General bed mobility comments: increased time, use of bed features,  increased effort. Patient Response: Cooperative  Transfers Overall transfer level: Needs assistance Equipment used: Rolling walker (2 wheels) Transfers: Sit to/from Stand Sit to Stand: Supervision           General transfer comment: Pt did not need physical assistance to stand from EOB today    Ambulation/Gait Ambulation/Gait assistance: Min guard Gait Distance (Feet): 300 Feet Assistive device: Rolling walker (2 wheels) Gait Pattern/deviations: Step-through pattern, Decreased stride length, Decreased step length - right, Decreased step length - left Gait velocity: Decreased cadence. Gait velocity interpretation: 1.31 - 2.62 ft/sec, indicative of limited community ambulator   General Gait Details: Performed 6 min walk test. Pt ambulated 300 ft on RA with O2 sats 92% at 2 min, 4 min and 6 min Hr 85 bpm at 4 min and 135 bpm at 6 min   Stairs                Balance Overall balance assessment: Mild deficits observed, not formally tested              Cognition Arousal/Alertness: Awake/alert Behavior During Therapy: WFL for tasks assessed/performed Overall Cognitive Status: Within Functional Limits for tasks assessed       General Comments: pleasantly cooperative, needs min safety cues for transfers               Pertinent Vitals/Pain Pain Assessment Pain Assessment: No/denies pain     PT Goals (current goals can now be found in the care plan section) Acute Rehab PT Goals Patient Stated Goal: home PT Goal Formulation: With patient/family Time For Goal Achievement: 09/18/22 Potential to Achieve Goals: Good Progress towards PT goals: Progressing toward goals    Frequency  Min 3X/week      PT Plan Current plan remains appropriate       AM-PAC PT "6 Clicks" Mobility   Outcome Measure  Help needed turning from your back to your side while in a flat bed without using bedrails?: None Help needed moving from lying on your back to sitting on  the side of a flat bed without using bedrails?: A Little Help needed moving to and from a bed to a chair (including a wheelchair)?: A Little Help needed standing up from a chair using your arms (e.g., wheelchair or bedside chair)?: A Little Help needed to walk in hospital room?: A Little Help needed climbing 3-5 steps with a railing? : A Lot 6 Click Score: 18    End of Session Equipment Utilized During Treatment: Gait belt;Oxygen (Pt on O2 when in bed and at end of session due to elevated HR) Activity Tolerance: Patient tolerated treatment well Patient left: in bed;with call bell/phone within reach;with bed alarm set;with family/visitor present Nurse Communication: Mobility status PT Visit Diagnosis: Unsteadiness on feet (R26.81);Muscle weakness (generalized) (M62.81);Difficulty in walking, not elsewhere classified (R26.2)     Time: 8657-8469 PT Time Calculation (min) (ACUTE ONLY): 23 min  Charges:  $Gait Training: 8-22 mins $Therapeutic Activity: 8-22 mins                    Tomma Rakers, DPT, [facility] Office: [phone] (Secure chat preferred)    Ander Purpura 09/09/2022, 1:58 PM

## 2022-09-09 NOTE — Discharge Summary (Addendum)
Physician Discharge Summary   Patient: Adrienne [name] MRN: [id number] DOB: [birth year]  Admit date:     09/03/2022  Discharge date: 09/09/22  Discharge Physician: Elmarie Shiley   PCP: Kathyrn Lass, MD   Recommendations at discharge:    Follow up resolution of Gout.  Follow up HF, and need repeat ECHO for mitral Valve regurgitation.   Discharge Diagnoses: Active Problems:   Acute respiratory failure with hypoxia (HCC)   CAP (community acquired pneumonia)   Heart failure with preserved ejection fraction ([place])   Complicated urinary tract infection   Leukocytosis   Chronic atrial fibrillation (HCC)   Pulmonary HTN ([place])   Essential hypertension   DNR (do not resuscitate) discussion  Resolved Problems:   * No resolved hospital problems. *  Hospital Course: 88 year old with past medical history significant for hypertension, hyperlipidemia, A-fib, CAD, pulmonary hypertension, CKD, breast cancer status postradiation, GERD presented complaining of acute onset of shortness of breath. With EMS she was noted to be tachypneic and wheezing. She received DuoNebs and IV Solu-Medrol. She was noted to be tachypneic, oxygen saturation 86 improved from BiPAP. Chest x-ray noticed subtle interstitial and patchy our space opacity in the right lung concerning for atelectasis versus infection. She has been weaned off of BiPAP. He has been receiving treatment for pneumonia, IV Lasix for acute on chronic diastolic heart failure.   Assessment and Plan: 1-Acute Hypoxic Respiratory Failure secondary to pneumonia and heart failure exacerbation Initially required BiPAP, for oxygen saturation 86 and tachypnea. Influenza, COVID and RSV negative. Respiratory  panel negative.  Strep pneumonia negative and Legionella pending Completed azithromycin.  Change Rocephin to Zosyn to cover for UTI Pseudomona Improved with IV Lasix's, negative 14 L. Transition to oral lasix.    2-Acute on chronic diastolic  heart failure Pulmonary hypertension Mitral regurgitation BNP  elevated, she improve with IV Lasix. ECHO-EF: 50 to 55% with moderate LVH of basal septal segment. Left ventricle demonstrated global hypokinesis. Moderate to severe MR  Transition to oral lasix.  Appreciate cardiology assistance. Follow up out patient to repeat ECHO.    3-UTI, related to chronic indwelling catheter.  Patient has chronic indwelling Foley catheter with history of uterine prolapse. Urine culture growing Pseudomonas, antibiotics changed to Zosyn Received 3 days of Zosyn and fosfomycin.  Foley exchange.   4-Leukocytosis, in the setting of UTI and possible pneumonia.  Afebrile  increase today, likely related to prednisone.   5-Chronic A-fib on chronic anticoagulation: Continue Eliquis, Cardizem and metoprolol   6-Hypertension: Continue metoprolol and Cardizem.   7-History of CVA: Continue statin and Eliquis   8-CKD stage IIIa: Stable monitor.  9-History of breast cancer: Outpatient follow-up with oncology 10-Hyperlipidemia: Continue with atorvastatin 11-GERD: Continue with PPI. 12-Foot pain, suspect gout. Started  colchicine. Uric acid 7.3. started allopurinol.   Discharge cancel, patient develops left shoulder pain, improved on prednisone and tramadol. pain improved.             Consultants: Cardiology  Procedures performed: ECHO Disposition: Home Diet recommendation:  Discharge Diet Orders (From admission, onward)     Start     Ordered   09/09/22 0000  Diet - low sodium heart healthy        09/09/22 1129   09/08/22 0000  Diet - low sodium heart healthy        09/08/22 0933           Cardiac diet DISCHARGE MEDICATION: Allergies as of 09/09/2022       Reactions  Valium [diazepam] Other (See Comments)   HYPERACTIVITY   Amiodarone Hcl [amiodarone] Other (See Comments)   AFIB   Compazine [prochlorperazine] Other (See Comments)   HYPERACTIVITY    Other Other (See Comments)    "Kidney dye"--patient states this gave her severe shakes/chills   Thorazine [chlorpromazine] Other (See Comments)   HYPERACTIVITY    Zometa [zoledronic Acid] Other (See Comments)   PROBLEMS WITH EYES TIA   Ciprofloxacin Swelling   "swelling" in Rt.elbow   Doxycycline Nausea Only        Medication List     STOP taking these medications    nitrofurantoin 100 MG capsule Commonly known as: MACRODANTIN       TAKE these medications    acetaminophen 500 MG tablet Commonly known as: TYLENOL Take 500-1,000 mg by mouth every 12 (twelve) hours as needed (pain).   albuterol 108 (90 Base) MCG/ACT inhaler Commonly known as: Ventolin HFA Inhale 1 puff into the lungs every 6 (six) hours as needed for wheezing or shortness of breath (Cough).   allopurinol 100 MG tablet Commonly known as: ZYLOPRIM Take 1 tablet (100 mg total) by mouth daily.   atorvastatin 10 MG tablet Commonly known as: LIPITOR Take 1 tablet (10 mg total) by mouth daily.   colchicine 0.6 MG tablet Take 1 tablet (0.6 mg total) by mouth daily for 10 days.   diltiazem 60 MG tablet Commonly known as: CARDIZEM Take 1 tablet (60 mg total) by mouth 3 (three) times daily.   Eliquis 5 MG Tabs tablet Generic drug: apixaban TAKE 1 TABLET TWICE A DAY What changed: how much to take   furosemide 40 MG tablet Commonly known as: Lasix Take 1 tablet (40 mg total) by mouth daily.   metoprolol tartrate 100 MG tablet Commonly known as: LOPRESSOR Take 1 tablet (100 mg total) by mouth 2 (two) times daily.   multivitamin with minerals Tabs tablet Take 1 tablet by mouth daily. Centrum for Women 50+   omeprazole 10 MG capsule Commonly known as: PRILOSEC Take 10 mg by mouth every other day.   Potassium Chloride ER 20 MEQ Tbcr Take 20 mEq by mouth daily.   predniSONE 20 MG tablet Commonly known as: DELTASONE Take 1 tablet (20 mg total) by mouth daily with breakfast. Start taking on: September 10, 2022   spironolactone  25 MG tablet Commonly known as: ALDACTONE Take 1 tablet (25 mg total) by mouth daily.   traMADol 50 MG tablet Commonly known as: ULTRAM Take 0.5 tablets (25 mg total) by mouth every 8 (eight) hours as needed for up to 5 days for moderate pain.   UP4 PROBIOTICS WOMENS PO Take 1 capsule by mouth daily.   vitamin C with rose hips 500 MG tablet Take 500 mg by mouth in the morning and at bedtime.               Durable Medical Equipment  (From admission, onward)           Start     Ordered   09/07/22 1137  For home use only DME oxygen  Once       Question Answer Comment  Length of Need 6 Months   Mode or (Route) Nasal cannula   Liters per Minute 2   Frequency Continuous (stationary and portable oxygen unit needed)   Oxygen delivery system Gas      09/07/22 1136            Follow-up Information     Sabra Heck,  Lattie Haw, MD Follow up in 1 week(s).   Specialty: Family Medicine Contact information: [place] [address] [phone]         Jerline Pain, MD Follow up in 1 week(s).   Specialty: Cardiology Contact information: [address] [address] [phone]         [facility] Follow up.   Specialty: Home Health Services Contact information: [place] [address] [phone]                Discharge Exam: Danley Danker Weights   09/05/22 0962 09/07/22 0434 09/08/22 0510  Weight: 60.5 kg 60.2 kg 59.2 kg   General; NAD  Condition at discharge: stable  The results of significant diagnostics from this hospitalization (including imaging, microbiology, ancillary and laboratory) are listed below for reference.   Imaging Studies: ECHOCARDIOGRAM COMPLETE  Result Date: 09/04/2022    ECHOCARDIOGRAM REPORT   Patient Name:   Adrienne [name] Date of Exam: 09/04/2022 Medical Rec #:  [id number]                Height:       60.0 in Accession #:    [id number]                Weight:       136.9 lb Date of Birth:  July 15, 1928                BSA:          1.589 m Patient Age:    54 years                 BP:           129/64 mmHg Patient Gender: F                        HR:           62 bpm. Exam Location:  Inpatient Procedure: 2D Echo, Cardiac Doppler and Color Doppler Indications:    CHF-acute systolic  History:        Patient has prior history of Echocardiogram examinations, most                 recent 12/09/2021. CAD, Pulmonary HTN, Arrythmias:Atrial                 Fibrillation; Signs/Symptoms:Shortness of Breath. CKD.  Sonographer:    Clayton Lefort RDCS (AE) Referring Phys: [id number] RONDELL A SMITH IMPRESSIONS  1. Left ventricular ejection fraction, by estimation, is 50 to 55%. The left ventricle has low normal function. The left ventricle demonstrates global hypokinesis. There is moderate left ventricular hypertrophy of the basal-septal segment. Left ventricular diastolic function could not be evaluated.  2. Right ventricular systolic function is mildly reduced. The right ventricular size is moderately enlarged.  3. Left atrial size was mildly dilated.  4. Right atrial size was mild to moderately dilated.  5. The mitral valve is degenerative. Moderate to severe mitral valve regurgitation. No evidence of mitral stenosis.  6. The aortic valve is tricuspid. Aortic valve regurgitation is not visualized. Aortic valve sclerosis/calcification is present, without any evidence of aortic stenosis. Comparison(s): No significant change from prior study. FINDINGS  Left Ventricle: Left ventricular ejection fraction, by estimation, is 50 to 55%. The left ventricle has low normal function. The left ventricle demonstrates global hypokinesis. The left ventricular internal cavity size was normal in  size. There is moderate left ventricular hypertrophy of the basal-septal segment. Left ventricular diastolic function could not be evaluated due to atrial fibrillation. Left ventricular diastolic  function could not be evaluated. Right Ventricle: The right ventricular size is moderately enlarged. No increase in right ventricular wall thickness. Right ventricular systolic function is mildly reduced. Left Atrium: Left atrial size was mildly dilated. Right Atrium: Right atrial size was mild to moderately dilated. Pericardium: There is no evidence of pericardial effusion. Mitral Valve: The mitral valve is degenerative in appearance. Mild to moderate mitral annular calcification. Moderate to severe mitral valve regurgitation. No evidence of mitral valve stenosis. Tricuspid Valve: The tricuspid valve is grossly normal. Tricuspid valve regurgitation is mild . No evidence of tricuspid stenosis. Aortic Valve: The aortic valve is tricuspid. Aortic valve regurgitation is not visualized. Aortic valve sclerosis/calcification is present, without any evidence of aortic stenosis. Aortic valve mean gradient measures 4.0 mmHg. Aortic valve peak gradient measures 7.8 mmHg. Aortic valve area, by VTI measures 1.63 cm. Pulmonic Valve: The pulmonic valve was grossly normal. Pulmonic valve regurgitation is trivial. No evidence of pulmonic stenosis. Aorta: The aortic root and ascending aorta are structurally normal, with no evidence of dilitation. Venous: The inferior vena cava was not well visualized. IAS/Shunts: The atrial septum is grossly normal.  LEFT VENTRICLE PLAX 2D LVIDd:         4.60 cm LVIDs:         3.20 cm LV PW:         1.10 cm LV IVS:        1.40 cm LVOT diam:     1.90 cm LV SV:         58 LV SV Index:   37 LVOT Area:     2.84 cm  RIGHT VENTRICLE RV Basal diam:  4.40 cm RV Mid diam:    4.70 cm RV S prime:     8.49 cm/s TAPSE (M-mode): 1.6 cm LEFT ATRIUM             Index        RIGHT ATRIUM           Index LA diam:        4.30 cm 2.71 cm/m   RA Area:     24.00 cm LA Vol (A2C):   61.4 ml 38.64 ml/m  RA Volume:   73.20 ml  46.07 ml/m LA Vol (A4C):   52.7 ml 33.17 ml/m LA Biplane Vol: 58.4 ml 36.76 ml/m  AORTIC  VALVE AV Area (Vmax):    1.67 cm AV Area (Vmean):   1.49 cm AV Area (VTI):     1.63 cm AV Vmax:           140.00 cm/s AV Vmean:          98.100 cm/s AV VTI:            0.358 m AV Peak Grad:      7.8 mmHg AV Mean Grad:      4.0 mmHg LVOT Vmax:         82.50 cm/s LVOT Vmean:        51.700 cm/s LVOT VTI:          0.206 m LVOT/AV VTI ratio: 0.58  AORTA Ao Root diam: 3.10 cm Ao Asc diam:  3.30 cm TRICUSPID VALVE TR Peak grad:   36.5 mmHg TR Vmax:        302.00 cm/s  SHUNTS Systemic VTI:  0.21 m Systemic Diam:  1.90 cm Eleonore Chiquito MD Electronically signed by Eleonore Chiquito MD Signature Date/Time: 09/04/2022/2:03:01 PM    Final    DG Chest Port 1 View  Result Date: 09/03/2022 CLINICAL DATA:  Dyspnea. History of CAD, pulmonary hypertension, and left breast cancer status post lumpectomy. EXAM: PORTABLE CHEST 1 VIEW COMPARISON:  Chest radiograph 08/17/2021 and earlier; CT chest 06/29/2020 FINDINGS: Of note, patient is mildly rotated on this portable examination. Borderline enlarged cardiac silhouette, stable. Aortic calcifications. Subtle interstitial and patchy airspace opacity of the right lung base. Left lung is clear. No pleural effusion or pneumothorax. Diffuse osteopenia. Surgical clips overlie the left chest wall and left axilla. IMPRESSION: Subtle interstitial and patchy airspace opacity of the right lung base, which may reflect atelectasis versus infection in the appropriate clinical context. Electronically Signed   By: Ileana Roup M.D.   On: 09/03/2022 09:38    Microbiology: Results for orders placed or performed during the hospital encounter of 09/03/22  Resp panel by RT-PCR (RSV, Flu A&B, Covid) Anterior Nasal Swab     Status: None   Collection Time: 09/03/22  8:50 AM   Specimen: Anterior Nasal Swab  Result Value Ref Range Status   SARS Coronavirus 2 by RT PCR NEGATIVE NEGATIVE Final    Comment: (NOTE) SARS-CoV-2 target nucleic acids are NOT DETECTED.  The SARS-CoV-2 RNA is generally  detectable in upper respiratory specimens during the acute phase of infection. The lowest concentration of SARS-CoV-2 viral copies this assay can detect is 138 copies/mL. A negative result does not preclude SARS-Cov-2 infection and should not be used as the sole basis for treatment or other patient management decisions. A negative result may occur with  improper specimen collection/handling, submission of specimen other than nasopharyngeal swab, presence of viral mutation(s) within the areas targeted by this assay, and inadequate number of viral copies(<138 copies/mL). A negative result must be combined with clinical observations, patient history, and epidemiological information. The expected result is Negative.  Fact Sheet for Patients:  EntrepreneurPulse.com.au  Fact Sheet for Healthcare Providers:  IncredibleEmployment.be  This test is no t yet approved or cleared by the [place] FDA and  has been authorized for detection and/or diagnosis of SARS-CoV-2 by FDA under an Emergency Use Authorization (EUA). This EUA will remain  in effect (meaning this test can be used) for the duration of the COVID-19 declaration under Section 564(b)(1) of the Act, 21 U.S.C.section 360bbb-3(b)(1), unless the authorization is terminated  or revoked sooner.       Influenza A by PCR NEGATIVE NEGATIVE Final   Influenza B by PCR NEGATIVE NEGATIVE Final    Comment: (NOTE) The Xpert Xpress SARS-CoV-2/FLU/RSV plus assay is intended as an aid in the diagnosis of influenza from Nasopharyngeal swab specimens and should not be used as a sole basis for treatment. Nasal washings and aspirates are unacceptable for Xpert Xpress SARS-CoV-2/FLU/RSV testing.  Fact Sheet for Patients: EntrepreneurPulse.com.au  Fact Sheet for Healthcare Providers: IncredibleEmployment.be  This test is not yet approved or cleared by the [place] FDA  and has been authorized for detection and/or diagnosis of SARS-CoV-2 by FDA under an Emergency Use Authorization (EUA). This EUA will remain in effect (meaning this test can be used) for the duration of the COVID-19 declaration under Section 564(b)(1) of the Act, 21 U.S.C. section 360bbb-3(b)(1), unless the authorization is terminated or revoked.     Resp Syncytial Virus by PCR NEGATIVE NEGATIVE Final    Comment: (NOTE) Fact Sheet for Patients: EntrepreneurPulse.com.au  Fact Sheet for  Healthcare Providers: IncredibleEmployment.be  This test is not yet approved or cleared by the [place] and has been authorized for detection and/or diagnosis of SARS-CoV-2 by FDA under an Emergency Use Authorization (EUA). This EUA will remain in effect (meaning this test can be used) for the duration of the COVID-19 declaration under Section 564(b)(1) of the Act, 21 U.S.C. section 360bbb-3(b)(1), unless the authorization is terminated or revoked.  Performed at [facility], [place] [address] [place], [place] [address]   Urine Culture     Status: Abnormal   Collection Time: 09/03/22 10:02 AM   Specimen: Urine, Clean Catch  Result Value Ref Range Status   Specimen Description URINE, CLEAN CATCH  Final   Special Requests   Final    NONE Performed at [facility], [place] [address], [place], [place] [address]    Culture 60,000 COLONIES/mL PSEUDOMONAS AERUGINOSA (A)  Final   Report Status 09/07/2022 FINAL  Final   Organism ID, Bacteria PSEUDOMONAS AERUGINOSA (A)  Final      Susceptibility   Pseudomonas aeruginosa - MIC*    CEFTAZIDIME 4 SENSITIVE Sensitive     CIPROFLOXACIN <=0.25 SENSITIVE Sensitive     GENTAMICIN <=1 SENSITIVE Sensitive     IMIPENEM 2 SENSITIVE Sensitive     PIP/TAZO 8 SENSITIVE Sensitive     CEFEPIME 2 SENSITIVE Sensitive     * 60,000 COLONIES/mL PSEUDOMONAS AERUGINOSA  Blood culture (routine x 2)     Status: None    Collection Time: 09/03/22 10:15 AM   Specimen: BLOOD LEFT HAND  Result Value Ref Range Status   Specimen Description BLOOD LEFT HAND  Final   Special Requests   Final    BOTTLES DRAWN AEROBIC AND ANAEROBIC Blood Culture adequate volume   Culture   Final    NO GROWTH 5 DAYS Performed at [facility], [address] [place], [place] [address]    Report Status 09/08/2022 FINAL  Final  Blood culture (routine x 2)     Status: None   Collection Time: 09/03/22 10:25 AM   Specimen: BLOOD RIGHT HAND  Result Value Ref Range Status   Specimen Description BLOOD RIGHT HAND  Final   Special Requests   Final    BOTTLES DRAWN AEROBIC AND ANAEROBIC Blood Culture adequate volume   Culture   Final    NO GROWTH 5 DAYS Performed at [facility], [place] [address] [place], [place] [address]    Report Status 09/08/2022 FINAL  Final  Respiratory (~20 pathogens) panel by PCR     Status: None   Collection Time: 09/04/22  6:00 AM   Specimen: Nasopharyngeal Swab; Respiratory  Result Value Ref Range Status   Adenovirus NOT DETECTED NOT DETECTED Final   Coronavirus 229E NOT DETECTED NOT DETECTED Final    Comment: (NOTE) The Coronavirus on the Respiratory Panel, DOES NOT test for the novel  Coronavirus (2019 nCoV)    Coronavirus HKU1 NOT DETECTED NOT DETECTED Final   Coronavirus NL63 NOT DETECTED NOT DETECTED Final   Coronavirus OC43 NOT DETECTED NOT DETECTED Final   Metapneumovirus NOT DETECTED NOT DETECTED Final   Rhinovirus / Enterovirus NOT DETECTED NOT DETECTED Final   Influenza A NOT DETECTED NOT DETECTED Final   Influenza B NOT DETECTED NOT DETECTED Final   Parainfluenza Virus 1 NOT DETECTED NOT DETECTED Final   Parainfluenza Virus 2 NOT DETECTED NOT DETECTED Final   Parainfluenza Virus 3 NOT DETECTED NOT DETECTED Final  Parainfluenza Virus 4 NOT DETECTED NOT DETECTED Final   Respiratory Syncytial Virus NOT DETECTED NOT DETECTED Final   Bordetella pertussis NOT DETECTED NOT DETECTED  Final   Bordetella Parapertussis NOT DETECTED NOT DETECTED Final   Chlamydophila pneumoniae NOT DETECTED NOT DETECTED Final   Mycoplasma pneumoniae NOT DETECTED NOT DETECTED Final    Comment: Performed at [facility], [place] [address], [place], [place] [address]    Labs: CBC: Recent Labs  Lab 09/03/22 [phone] 09/03/22 0856 09/05/22 0242 09/06/22 0337 09/07/22 0324 09/08/22 0150 09/09/22 0655  WBC 16.2*   < > 19.4* 17.4* 10.6* 10.6* 12.0*  NEUTROABS 13.1*  --   --   --   --   --   --   HGB 13.1   < > 11.7* 12.1 11.3* 10.8* 11.2*  HCT 39.5   < > 35.9* 37.1 33.3* 31.5* 32.1*  MCV 93.4   < > 94.2 93.9 92.0 91.3 90.4  PLT 219   < > 206 221 206 203 232   < > = values in this interval not displayed.   Basic Metabolic Panel: Recent Labs  Lab 09/03/22 0850 09/03/22 0856 09/04/22 0836 09/05/22 0242 09/06/22 0337 09/07/22 0324 09/08/22 0150 09/09/22 0655  NA 137   < >  --  137 136 136 135 135  K 3.5   < >  --  4.5 4.2 3.8 3.7 3.9  CL 99   < >  --  101 98 97* 94* 95*  CO2 28   < >  --  '26 27 31 31 30  '$ GLUCOSE 149*   < >  --  104* 86 85 89 105*  BUN 21   < >  --  32* 26* '22 18 16  '$ CREATININE 1.08*   < >  --  1.05* 0.92 0.94 0.99 0.91  CALCIUM 9.4   < >  --  8.9 9.0 8.6* 8.2* 8.9  MG 1.7  --  2.0 2.1 2.0  --   --   --    < > = values in this interval not displayed.   Liver Function Tests: Recent Labs  Lab 09/03/22 0850 09/06/22 0337  AST 25 19  ALT 17 16  ALKPHOS 85 73  BILITOT 0.8 0.7  PROT 7.3 6.4*  ALBUMIN 3.9 3.2*   CBG: No results for input(s): "GLUCAP" in the last 168 hours.  Discharge time spent: greater than 30 minutes.  Signed: Elmarie Shiley, MD [facility] 09/09/2022

## 2022-09-09 NOTE — Discharge Instructions (Signed)
Please follow less than 2 gr (2000 mg ) sodium diet.  Limit fluid intake to 50 ounces a day,.

## 2022-09-19 DIAGNOSIS — I340 Nonrheumatic mitral (valve) insufficiency: Secondary | ICD-10-CM | POA: Diagnosis not present

## 2022-09-19 DIAGNOSIS — M10079 Idiopathic gout, unspecified ankle and foot: Secondary | ICD-10-CM | POA: Diagnosis not present

## 2022-09-19 DIAGNOSIS — J9611 Chronic respiratory failure with hypoxia: Secondary | ICD-10-CM | POA: Diagnosis not present

## 2022-09-19 DIAGNOSIS — J9601 Acute respiratory failure with hypoxia: Secondary | ICD-10-CM | POA: Diagnosis not present

## 2022-09-19 DIAGNOSIS — I5033 Acute on chronic diastolic (congestive) heart failure: Secondary | ICD-10-CM | POA: Diagnosis not present

## 2022-09-20 DIAGNOSIS — J189 Pneumonia, unspecified organism: Secondary | ICD-10-CM | POA: Diagnosis not present

## 2022-09-20 DIAGNOSIS — Z96653 Presence of artificial knee joint, bilateral: Secondary | ICD-10-CM | POA: Diagnosis not present

## 2022-09-20 DIAGNOSIS — I2510 Atherosclerotic heart disease of native coronary artery without angina pectoris: Secondary | ICD-10-CM | POA: Diagnosis not present

## 2022-09-20 DIAGNOSIS — Z87440 Personal history of urinary (tract) infections: Secondary | ICD-10-CM | POA: Diagnosis not present

## 2022-09-20 DIAGNOSIS — I340 Nonrheumatic mitral (valve) insufficiency: Secondary | ICD-10-CM | POA: Diagnosis not present

## 2022-09-20 DIAGNOSIS — M25512 Pain in left shoulder: Secondary | ICD-10-CM | POA: Diagnosis not present

## 2022-09-20 DIAGNOSIS — J9601 Acute respiratory failure with hypoxia: Secondary | ICD-10-CM | POA: Diagnosis not present

## 2022-09-20 DIAGNOSIS — Z7901 Long term (current) use of anticoagulants: Secondary | ICD-10-CM | POA: Diagnosis not present

## 2022-09-20 DIAGNOSIS — I5033 Acute on chronic diastolic (congestive) heart failure: Secondary | ICD-10-CM | POA: Diagnosis not present

## 2022-09-20 DIAGNOSIS — Z9181 History of falling: Secondary | ICD-10-CM | POA: Diagnosis not present

## 2022-09-20 DIAGNOSIS — Z853 Personal history of malignant neoplasm of breast: Secondary | ICD-10-CM | POA: Diagnosis not present

## 2022-09-20 DIAGNOSIS — M25571 Pain in right ankle and joints of right foot: Secondary | ICD-10-CM | POA: Diagnosis not present

## 2022-09-20 DIAGNOSIS — K219 Gastro-esophageal reflux disease without esophagitis: Secondary | ICD-10-CM | POA: Diagnosis not present

## 2022-09-20 DIAGNOSIS — Z8673 Personal history of transient ischemic attack (TIA), and cerebral infarction without residual deficits: Secondary | ICD-10-CM | POA: Diagnosis not present

## 2022-09-20 DIAGNOSIS — N1831 Chronic kidney disease, stage 3a: Secondary | ICD-10-CM | POA: Diagnosis not present

## 2022-09-20 DIAGNOSIS — I4819 Other persistent atrial fibrillation: Secondary | ICD-10-CM | POA: Diagnosis not present

## 2022-09-20 DIAGNOSIS — Z923 Personal history of irradiation: Secondary | ICD-10-CM | POA: Diagnosis not present

## 2022-09-20 DIAGNOSIS — N814 Uterovaginal prolapse, unspecified: Secondary | ICD-10-CM | POA: Diagnosis not present

## 2022-09-20 DIAGNOSIS — T83511D Infection and inflammatory reaction due to indwelling urethral catheter, subsequent encounter: Secondary | ICD-10-CM | POA: Diagnosis not present

## 2022-09-20 DIAGNOSIS — M1990 Unspecified osteoarthritis, unspecified site: Secondary | ICD-10-CM | POA: Diagnosis not present

## 2022-09-20 DIAGNOSIS — I130 Hypertensive heart and chronic kidney disease with heart failure and stage 1 through stage 4 chronic kidney disease, or unspecified chronic kidney disease: Secondary | ICD-10-CM | POA: Diagnosis not present

## 2022-09-20 DIAGNOSIS — E785 Hyperlipidemia, unspecified: Secondary | ICD-10-CM | POA: Diagnosis not present

## 2022-09-20 DIAGNOSIS — I272 Pulmonary hypertension, unspecified: Secondary | ICD-10-CM | POA: Diagnosis not present

## 2022-09-22 DIAGNOSIS — K219 Gastro-esophageal reflux disease without esophagitis: Secondary | ICD-10-CM | POA: Diagnosis not present

## 2022-09-22 DIAGNOSIS — I4891 Unspecified atrial fibrillation: Secondary | ICD-10-CM | POA: Diagnosis not present

## 2022-09-22 DIAGNOSIS — J189 Pneumonia, unspecified organism: Secondary | ICD-10-CM | POA: Diagnosis not present

## 2022-09-22 DIAGNOSIS — J9601 Acute respiratory failure with hypoxia: Secondary | ICD-10-CM | POA: Diagnosis not present

## 2022-09-22 DIAGNOSIS — I5033 Acute on chronic diastolic (congestive) heart failure: Secondary | ICD-10-CM | POA: Diagnosis not present

## 2022-09-22 DIAGNOSIS — T83511D Infection and inflammatory reaction due to indwelling urethral catheter, subsequent encounter: Secondary | ICD-10-CM | POA: Diagnosis not present

## 2022-09-22 DIAGNOSIS — E7800 Pure hypercholesterolemia, unspecified: Secondary | ICD-10-CM | POA: Diagnosis not present

## 2022-09-22 DIAGNOSIS — N1831 Chronic kidney disease, stage 3a: Secondary | ICD-10-CM | POA: Diagnosis not present

## 2022-09-22 DIAGNOSIS — I130 Hypertensive heart and chronic kidney disease with heart failure and stage 1 through stage 4 chronic kidney disease, or unspecified chronic kidney disease: Secondary | ICD-10-CM | POA: Diagnosis not present

## 2022-09-22 DIAGNOSIS — N183 Chronic kidney disease, stage 3 unspecified: Secondary | ICD-10-CM | POA: Diagnosis not present

## 2022-09-22 DIAGNOSIS — M810 Age-related osteoporosis without current pathological fracture: Secondary | ICD-10-CM | POA: Diagnosis not present

## 2022-09-23 DIAGNOSIS — N3000 Acute cystitis without hematuria: Secondary | ICD-10-CM | POA: Diagnosis not present

## 2022-09-23 DIAGNOSIS — N8111 Cystocele, midline: Secondary | ICD-10-CM | POA: Diagnosis not present

## 2022-09-27 DIAGNOSIS — I5033 Acute on chronic diastolic (congestive) heart failure: Secondary | ICD-10-CM | POA: Diagnosis not present

## 2022-09-27 DIAGNOSIS — T83511D Infection and inflammatory reaction due to indwelling urethral catheter, subsequent encounter: Secondary | ICD-10-CM | POA: Diagnosis not present

## 2022-09-27 DIAGNOSIS — N1831 Chronic kidney disease, stage 3a: Secondary | ICD-10-CM | POA: Diagnosis not present

## 2022-09-27 DIAGNOSIS — J9601 Acute respiratory failure with hypoxia: Secondary | ICD-10-CM | POA: Diagnosis not present

## 2022-09-27 DIAGNOSIS — I130 Hypertensive heart and chronic kidney disease with heart failure and stage 1 through stage 4 chronic kidney disease, or unspecified chronic kidney disease: Secondary | ICD-10-CM | POA: Diagnosis not present

## 2022-09-27 DIAGNOSIS — J189 Pneumonia, unspecified organism: Secondary | ICD-10-CM | POA: Diagnosis not present

## 2022-09-29 DIAGNOSIS — T83511D Infection and inflammatory reaction due to indwelling urethral catheter, subsequent encounter: Secondary | ICD-10-CM | POA: Diagnosis not present

## 2022-09-29 DIAGNOSIS — J189 Pneumonia, unspecified organism: Secondary | ICD-10-CM | POA: Diagnosis not present

## 2022-09-29 DIAGNOSIS — J9601 Acute respiratory failure with hypoxia: Secondary | ICD-10-CM | POA: Diagnosis not present

## 2022-09-29 DIAGNOSIS — I130 Hypertensive heart and chronic kidney disease with heart failure and stage 1 through stage 4 chronic kidney disease, or unspecified chronic kidney disease: Secondary | ICD-10-CM | POA: Diagnosis not present

## 2022-09-29 DIAGNOSIS — I5033 Acute on chronic diastolic (congestive) heart failure: Secondary | ICD-10-CM | POA: Diagnosis not present

## 2022-09-29 DIAGNOSIS — N1831 Chronic kidney disease, stage 3a: Secondary | ICD-10-CM | POA: Diagnosis not present

## 2022-10-03 DIAGNOSIS — R58 Hemorrhage, not elsewhere classified: Secondary | ICD-10-CM | POA: Diagnosis not present

## 2022-10-03 DIAGNOSIS — Z6827 Body mass index (BMI) 27.0-27.9, adult: Secondary | ICD-10-CM | POA: Diagnosis not present

## 2022-10-03 DIAGNOSIS — Z7901 Long term (current) use of anticoagulants: Secondary | ICD-10-CM | POA: Diagnosis not present

## 2022-10-05 DIAGNOSIS — J189 Pneumonia, unspecified organism: Secondary | ICD-10-CM | POA: Diagnosis not present

## 2022-10-05 DIAGNOSIS — J9601 Acute respiratory failure with hypoxia: Secondary | ICD-10-CM | POA: Diagnosis not present

## 2022-10-05 DIAGNOSIS — N1831 Chronic kidney disease, stage 3a: Secondary | ICD-10-CM | POA: Diagnosis not present

## 2022-10-05 DIAGNOSIS — T83511D Infection and inflammatory reaction due to indwelling urethral catheter, subsequent encounter: Secondary | ICD-10-CM | POA: Diagnosis not present

## 2022-10-05 DIAGNOSIS — I5033 Acute on chronic diastolic (congestive) heart failure: Secondary | ICD-10-CM | POA: Diagnosis not present

## 2022-10-05 DIAGNOSIS — I130 Hypertensive heart and chronic kidney disease with heart failure and stage 1 through stage 4 chronic kidney disease, or unspecified chronic kidney disease: Secondary | ICD-10-CM | POA: Diagnosis not present

## 2022-10-06 DIAGNOSIS — I5033 Acute on chronic diastolic (congestive) heart failure: Secondary | ICD-10-CM | POA: Diagnosis not present

## 2022-10-06 DIAGNOSIS — J9601 Acute respiratory failure with hypoxia: Secondary | ICD-10-CM | POA: Diagnosis not present

## 2022-10-06 DIAGNOSIS — N1831 Chronic kidney disease, stage 3a: Secondary | ICD-10-CM | POA: Diagnosis not present

## 2022-10-06 DIAGNOSIS — T83511D Infection and inflammatory reaction due to indwelling urethral catheter, subsequent encounter: Secondary | ICD-10-CM | POA: Diagnosis not present

## 2022-10-06 DIAGNOSIS — I130 Hypertensive heart and chronic kidney disease with heart failure and stage 1 through stage 4 chronic kidney disease, or unspecified chronic kidney disease: Secondary | ICD-10-CM | POA: Diagnosis not present

## 2022-10-06 DIAGNOSIS — J189 Pneumonia, unspecified organism: Secondary | ICD-10-CM | POA: Diagnosis not present

## 2022-10-07 ENCOUNTER — Ambulatory Visit: Payer: Medicare Other | Attending: Physician Assistant | Admitting: Cardiology

## 2022-10-07 ENCOUNTER — Encounter: Payer: Self-pay | Admitting: Cardiology

## 2022-10-07 VITALS — BP 138/70 | HR 62 | Ht 61.0 in | Wt 134.0 lb

## 2022-10-07 DIAGNOSIS — I2510 Atherosclerotic heart disease of native coronary artery without angina pectoris: Secondary | ICD-10-CM

## 2022-10-07 DIAGNOSIS — I482 Chronic atrial fibrillation, unspecified: Secondary | ICD-10-CM | POA: Diagnosis not present

## 2022-10-07 DIAGNOSIS — I83899 Varicose veins of unspecified lower extremities with other complications: Secondary | ICD-10-CM | POA: Diagnosis not present

## 2022-10-07 DIAGNOSIS — I2583 Coronary atherosclerosis due to lipid rich plaque: Secondary | ICD-10-CM | POA: Diagnosis not present

## 2022-10-07 MED ORDER — APIXABAN 2.5 MG PO TABS: 2.5000 mg | ORAL_TABLET | Freq: Two times a day (BID) | ORAL | 3 refills | Status: DC

## 2022-10-07 NOTE — Patient Instructions (Addendum)
Medication Instructions:  Please decrease your Eliquis to 2.5 mg one tablet twice a day. Continue all other medications as listed.  *If you need a refill on your cardiac medications before your next appointment, please call your pharmacy*  Follow-Up: At [facility], you and your health needs are our priority.  As part of our continuing mission to provide you with exceptional heart care, we have created designated Provider Care Teams.  These Care Teams include your primary Cardiologist (physician) and Advanced Practice Providers (APPs -  Physician Assistants and Nurse Practitioners) who all work together to provide you with the care you need, when you need it.  We recommend signing up for the patient portal called "MyChart".  Sign up information is provided on this After Visit Summary.  MyChart is used to connect with patients for Virtual Visits (Telemedicine).  Patients are able to view lab/test results, encounter notes, upcoming appointments, etc.  Non-urgent messages can be sent to your provider as well.   To learn more about what you can do with MyChart, go to [email or web address].    Your next appointment:   3 month(s)  Provider:   Richardson Dopp, PA

## 2022-10-07 NOTE — Progress Notes (Signed)
Cardiology Office Note:    Date:  10/07/2022   ID:  Adrienne [name], DOB 1927/12/04, MRN [id number]  PCP:  Kathyrn Lass, MD   [facility] Cardiologist:  Candee Furbish, MD     Referring MD: Kathyrn Lass, MD    History of Present Illness:    Adrienne [name] is a 89 y.o. female here for follow-up recent hospital stay, diastolic HF, PNA, IV lasix. Now on '40mg'$  PO QD. dry weight 125 pounds or 59 kg.  Permanent atrial fibrillation nonobstructive CAD by cath in 2013 prior left MCA stroke when she was off of anticoagulation secondary to GI bleed.  Had a hospitalization in December 2022 with heart failure as well as in late 2023.  Worsening leg edema shortness of breath EF 50 to 60% on echo.  She had 7 L out in 2022 and 14 L in 2023.  Been doing well since discharge.  Her left leg is more edematous, she cut one of her varicose veins and had bleeding incident.  She is on Eliquis 5 mg twice a day which we have decided to decrease to 2.5 mg twice a day since her dry weight is less than 60 kg.  I want to minimize her bleeding risk.  We reviewed echocardiogram as well.  Moderate mitral regurgitation was noted.  We discussed the potential for MitraClip but I do not think that she needs this at this time.  Past Medical History:  Diagnosis Date   Afib ([place])    Arthritis of ankle    CAD (coronary artery disease)    Cancer ([place]) 2012   breast cancer-right   Compression fracture of T12 vertebra (HCC)    Diverticulosis    Edema    Elevated uric acid in blood    GERD (gastroesophageal reflux disease)    GI bleed 12/2015   Hyperlipidemia    Hypertension    Long-term (current) use of anticoagulants    Moderate mitral regurgitation    Personal history of radiation therapy    Pulmonary HTN ([place])    Echo 1/19: EF 60-65, no RWMA, Ao sclerosis, trivial AI, mild MR, mod BAE, mod TR, PASP 49   Stroke ([place]) 01/25/2016   TIA (transient ischemic attack) 09/2011   Urinary  incontinence    Urinary retention     Past Surgical History:  Procedure Laterality Date   APPENDECTOMY     AXILLARY SENTINEL NODE BIOPSY Left 01/22/2020   Procedure: Left Axillary Sentinel Node Biopsy;  Surgeon: Rolm Bookbinder, MD;  Location: [facility];  Service: General;  Laterality: Left;   BREAST LUMPECTOMY WITH RADIOACTIVE SEED AND SENTINEL LYMPH NODE BIOPSY Left 01/22/2020   Procedure: LEFT BREAST LUMPECTOMY WITH RADIOACTIVE SEED;  Surgeon: Rolm Bookbinder, MD;  Location: [facility];  Service: General;  Laterality: Left;   BREAST SURGERY  2012   Rt.mastectomy--[place], [place] CATHETERIZATION  2013   CATARACT EXTRACTION, BILATERAL     CERVICAL CONIZATION W/BX N/A 08/08/2020   Procedure: VAGINAL SIDEWALL BIOPSY;  Surgeon: Megan Salon, MD;  Location: [facility];  Service: Gynecology;  Laterality: N/A;   DILATION AND CURETTAGE OF UTERUS     in her early 43's for DUB   ESOPHAGOGASTRODUODENOSCOPY Left 01/08/2016   Procedure: ESOPHAGOGASTRODUODENOSCOPY (EGD);  Surgeon: Arta Silence, MD;  Location: [facility];  Service: Endoscopy;  Laterality: Left;   GALLBLADDER SURGERY     MASTECTOMY Right 2012   RADIOLOGY WITH ANESTHESIA N/A 01/25/2016   Procedure: RADIOLOGY WITH ANESTHESIA;  Surgeon:  Luanne Bras, MD;  Location: [facility];  Service: Radiology;  Laterality: N/A;   REPLACEMENT TOTAL KNEE BILATERAL     TONSILLECTOMY AND ADENOIDECTOMY     as a child    Current Medications: Current Meds  Medication Sig   acetaminophen (TYLENOL) 500 MG tablet Take 500-1,000 mg by mouth every 12 (twelve) hours as needed (pain).    albuterol (VENTOLIN HFA) 108 (90 Base) MCG/ACT inhaler Inhale 1 puff into the lungs every 6 (six) hours as needed for wheezing or shortness of breath (Cough).   allopurinol (ZYLOPRIM) 100 MG tablet Take 1 tablet (100 mg total) by mouth daily.   apixaban (ELIQUIS) 2.5 MG TABS tablet Take 1 tablet (2.5 mg total) by mouth 2 (two) times daily.   Ascorbic Acid (VITAMIN C WITH  ROSE HIPS) 500 MG tablet Take 500 mg by mouth in the morning and at bedtime.   atorvastatin (LIPITOR) 10 MG tablet Take 1 tablet (10 mg total) by mouth daily.   B Complex Vitamins (VITAMIN B COMPLEX) CAPS daily.   diltiazem (CARDIZEM) 60 MG tablet Take 1 tablet (60 mg total) by mouth 3 (three) times daily.   furosemide (LASIX) 40 MG tablet Take 1 tablet (40 mg total) by mouth daily.   metoprolol tartrate (LOPRESSOR) 100 MG tablet Take 1 tablet (100 mg total) by mouth 2 (two) times daily.   Multiple Vitamin (MULTIVITAMIN WITH MINERALS) TABS tablet Take 1 tablet by mouth daily. Centrum for Women 50+   Multiple Vitamins-Minerals (CENTRUM SILVER 50+WOMEN) TABS daily.   omeprazole (PRILOSEC) 10 MG capsule Take 10 mg by mouth every other day.   Probiotic Product (UP4 PROBIOTICS WOMENS PO) Take 1 capsule by mouth daily.   spironolactone (ALDACTONE) 25 MG tablet Take 1 tablet (25 mg total) by mouth daily.   [DISCONTINUED] ELIQUIS 5 MG TABS tablet TAKE 1 TABLET TWICE A DAY   [DISCONTINUED] Potassium Chloride ER 20 MEQ TBCR Take 20 mEq by mouth daily.   [DISCONTINUED] predniSONE (DELTASONE) 20 MG tablet Take 1 tablet (20 mg total) by mouth daily with breakfast.     Allergies:   Valium [diazepam], Amiodarone hcl [amiodarone], Compazine [prochlorperazine], Other, Thorazine [chlorpromazine], Zometa [zoledronic acid], Ciprofloxacin, Doxycycline, and Zoster vac recomb adjuvanted   Social History   Socioeconomic History   Marital status: Married    Spouse name: Not on file   Number of children: Not on file   Years of education: Not on file   Highest education level: Not on file  Occupational History   Not on file  Tobacco Use   Smoking status: Former    Packs/day: 0.25    Years: 2.00    Total pack years: 0.50    Types: Cigarettes    Quit date: 1950    Years since quitting: 74.1   Smokeless tobacco: Never  Vaping Use   Vaping Use: Never used  Substance and Sexual Activity   Alcohol use: No     Alcohol/week: 0.0 standard drinks of alcohol   Drug use: No   Sexual activity: Not Currently    Partners: Male    Birth control/protection: Post-menopausal  Other Topics Concern   Not on file  Social History Narrative   Not on file   Social Determinants of Health   Financial Resource Strain: Not on file  Food Insecurity: Not on file  Transportation Needs: Not on file  Physical Activity: Not on file  Stress: Not on file  Social Connections: Not on file     Family History:  The patient's family history includes Asthma in her mother; Atrial fibrillation in her sister; CVA in her father; Cancer in her daughter and maternal grandmother; Pneumonia in her brother.  ROS:   Please see the history of present illness.     All other systems reviewed and are negative.  EKGs/Labs/Other Studies Reviewed:    The following studies were reviewed today:  ECHO 08/2022  1. Left ventricular ejection fraction, by estimation, is 50 to 55%. The  left ventricle has low normal function. The left ventricle demonstrates  global hypokinesis. There is moderate left ventricular hypertrophy of the  basal-septal segment. Left  ventricular diastolic function could not be evaluated.   2. Right ventricular systolic function is mildly reduced. The right  ventricular size is moderately enlarged.   3. Left atrial size was mildly dilated.   4. Right atrial size was mild to moderately dilated.   5. The mitral valve is degenerative. Moderate to severe mitral valve  regurgitation. No evidence of mitral stenosis.   6. The aortic valve is tricuspid. Aortic valve regurgitation is not  visualized. Aortic valve sclerosis/calcification is present, without any  evidence of aortic stenosis.   Comparison(s): No significant change from prior study.    ECHO 2023: Normal pump function.  Moderate mitral valve regurgitation.  No significant change from prior study.  Continue with current management strategy.  Recent  Labs: 09/03/2022: B Natriuretic Peptide 420.7; TSH 3.242 09/06/2022: ALT 16; Magnesium 2.0 09/09/2022: BUN 16; Creatinine, Ser 0.91; Hemoglobin 11.2; Platelets 232; Potassium 3.9; Sodium 135  Recent Lipid Panel    Component Value Date/Time   CHOL 128 05/06/2019 1608   TRIG 101 05/06/2019 1608   HDL 50 05/06/2019 1608   CHOLHDL 2.6 05/06/2019 1608   CHOLHDL 3.3 01/26/2016 0300   VLDL 20 01/26/2016 0300   LDLCALC 58 05/06/2019 1608     Risk Assessment/Calculations:    CHA2DS2-VASc Score = 8   This indicates a 10.8% annual risk of stroke. The patient's score is based upon: CHF History: 1 HTN History: 1 Diabetes History: 0 Stroke History: 2 Vascular Disease History: 1 Age Score: 2 Gender Score: 1              Physical Exam:    VS:  BP 138/70   Pulse 62   Ht '5\' 1"'$  (1.549 m)   Wt 134 lb (60.8 kg)   LMP 09/12/1973 (Approximate)   SpO2 97%   BMI 25.32 kg/m     Wt Readings from Last 3 Encounters:  10/07/22 134 lb (60.8 kg)  09/08/22 130 lb 8.2 oz (59.2 kg)  06/28/22 135 lb 3.2 oz (61.3 kg)     GEN:  Well nourished, well developed in no acute distress HEENT: Normal NECK: No JVD; No carotid bruits LYMPHATICS: No lymphadenopathy CARDIAC: Irregularly irregular, soft systolic murmur, no rubs, gallops RESPIRATORY:  Clear to auscultation without rales, wheezing or rhonchi  ABDOMEN: Soft, non-tender, non-distended MUSCULOSKELETAL:  edema; No deformity  SKIN: Warm and dry NEUROLOGIC:  Alert and oriented x 3 PSYCHIATRIC:  Normal affect   ASSESSMENT:    1. Bleeding from varicose vein   2. Chronic atrial fibrillation (HCC)   3. Coronary artery disease due to lipid rich plaque     PLAN:    In order of problems listed above:  Chronic atrial fibrillation (HCC) Currently well rate controlled on Cardizem 60 mg 3 times a day and metoprolol 100 mg twice a day.  No changes made.  Cerebral infarction due to thrombosis  of left middle cerebral artery (HCC) s/p  mechanical thrombectomy Prior stroke with thrombectomy.  This is when she was off of her Coumadin.  GI bleed Prior history of GI bleed.  She is doing well with Eliquis currently.  Chronic anticoagulation Doing well with Eliquis.  I am going to decrease her dosage of Eliquis to 2.5 mg twice a day since her dry weight is less than 60 kg and she is 89 years old.  We are sending in a new prescription.  This will minimize her bleeding risk.  Hemoglobin 12.2.  Creatinine 1.3.  Stable.  Chronic diastolic heart failure (HCC) Echocardiogram recently performed in March 2023 shows normal pump function with moderate mitral regurgitation.  We will continue to monitor.  Overall doing well with her current Lasix dosage of 40 mg daily.  She is watching her daily weights.  It has crept up slightly from her December date.  She is not having any shortness of breath however.  If she begins to have any shortness of breath or her weight continues to increase she knows to take an extra 40 mg of Lasix.  She can do this for up to 3 days.   Bleeding from varicose vein Bandage has helped.  We are decreasing her Eliquis dose as above.       Medication Adjustments/Labs and Tests Ordered: Current medicines are reviewed at length with the patient today.  Concerns regarding medicines are outlined above.  No orders of the defined types were placed in this encounter.  Meds ordered this encounter  Medications   apixaban (ELIQUIS) 2.5 MG TABS tablet    Sig: Take 1 tablet (2.5 mg total) by mouth 2 (two) times daily.    Dispense:  180 tablet    Refill:  3    Patient Instructions  Medication Instructions:  Please decrease your Eliquis to 2.5 mg one tablet twice a day. Continue all other medications as listed.  *If you need a refill on your cardiac medications before your next appointment, please call your pharmacy*  Follow-Up: At [facility], you and your health needs are our priority.  As part of our  continuing mission to provide you with exceptional heart care, we have created designated Provider Care Teams.  These Care Teams include your primary Cardiologist (physician) and Advanced Practice Providers (APPs -  Physician Assistants and Nurse Practitioners) who all work together to provide you with the care you need, when you need it.  We recommend signing up for the patient portal called "MyChart".  Sign up information is provided on this After Visit Summary.  MyChart is used to connect with patients for Virtual Visits (Telemedicine).  Patients are able to view lab/test results, encounter notes, upcoming appointments, etc.  Non-urgent messages can be sent to your provider as well.   To learn more about what you can do with MyChart, go to [email or web address].    Your next appointment:   3 month(s)  Provider:   Richardson Dopp, PA    Signed, Candee Furbish, MD  10/07/2022 5:19 PM    [place]

## 2022-10-11 ENCOUNTER — Telehealth: Payer: Self-pay | Admitting: Pharmacist

## 2022-10-11 DIAGNOSIS — I130 Hypertensive heart and chronic kidney disease with heart failure and stage 1 through stage 4 chronic kidney disease, or unspecified chronic kidney disease: Secondary | ICD-10-CM | POA: Diagnosis not present

## 2022-10-11 DIAGNOSIS — N1831 Chronic kidney disease, stage 3a: Secondary | ICD-10-CM | POA: Diagnosis not present

## 2022-10-11 DIAGNOSIS — J189 Pneumonia, unspecified organism: Secondary | ICD-10-CM | POA: Diagnosis not present

## 2022-10-11 DIAGNOSIS — I5033 Acute on chronic diastolic (congestive) heart failure: Secondary | ICD-10-CM | POA: Diagnosis not present

## 2022-10-11 DIAGNOSIS — J9601 Acute respiratory failure with hypoxia: Secondary | ICD-10-CM | POA: Diagnosis not present

## 2022-10-11 DIAGNOSIS — T83511D Infection and inflammatory reaction due to indwelling urethral catheter, subsequent encounter: Secondary | ICD-10-CM | POA: Diagnosis not present

## 2022-10-11 NOTE — Telephone Encounter (Signed)
Received fax from [facility] [facility]. Patient has Rx for Eliquis '5mg'$  and 2.'5mg'$  on file. Completed form and indicated that she should now be on 2.'5mg'$  BID. Instructed to d/c Eliquis '5mg'$ 

## 2022-10-18 DIAGNOSIS — Z1389 Encounter for screening for other disorder: Secondary | ICD-10-CM | POA: Diagnosis not present

## 2022-10-18 DIAGNOSIS — Z0000 Encounter for general adult medical examination without abnormal findings: Secondary | ICD-10-CM | POA: Diagnosis not present

## 2022-10-18 DIAGNOSIS — E663 Overweight: Secondary | ICD-10-CM | POA: Diagnosis not present

## 2022-10-20 DIAGNOSIS — N1831 Chronic kidney disease, stage 3a: Secondary | ICD-10-CM | POA: Diagnosis not present

## 2022-10-20 DIAGNOSIS — Z9181 History of falling: Secondary | ICD-10-CM | POA: Diagnosis not present

## 2022-10-20 DIAGNOSIS — T83511D Infection and inflammatory reaction due to indwelling urethral catheter, subsequent encounter: Secondary | ICD-10-CM | POA: Diagnosis not present

## 2022-10-20 DIAGNOSIS — J189 Pneumonia, unspecified organism: Secondary | ICD-10-CM | POA: Diagnosis not present

## 2022-10-20 DIAGNOSIS — I340 Nonrheumatic mitral (valve) insufficiency: Secondary | ICD-10-CM | POA: Diagnosis not present

## 2022-10-20 DIAGNOSIS — Z8673 Personal history of transient ischemic attack (TIA), and cerebral infarction without residual deficits: Secondary | ICD-10-CM | POA: Diagnosis not present

## 2022-10-20 DIAGNOSIS — Z7901 Long term (current) use of anticoagulants: Secondary | ICD-10-CM | POA: Diagnosis not present

## 2022-10-20 DIAGNOSIS — Z923 Personal history of irradiation: Secondary | ICD-10-CM | POA: Diagnosis not present

## 2022-10-20 DIAGNOSIS — I4819 Other persistent atrial fibrillation: Secondary | ICD-10-CM | POA: Diagnosis not present

## 2022-10-20 DIAGNOSIS — I130 Hypertensive heart and chronic kidney disease with heart failure and stage 1 through stage 4 chronic kidney disease, or unspecified chronic kidney disease: Secondary | ICD-10-CM | POA: Diagnosis not present

## 2022-10-20 DIAGNOSIS — Z96653 Presence of artificial knee joint, bilateral: Secondary | ICD-10-CM | POA: Diagnosis not present

## 2022-10-20 DIAGNOSIS — J9601 Acute respiratory failure with hypoxia: Secondary | ICD-10-CM | POA: Diagnosis not present

## 2022-10-20 DIAGNOSIS — M1990 Unspecified osteoarthritis, unspecified site: Secondary | ICD-10-CM | POA: Diagnosis not present

## 2022-10-20 DIAGNOSIS — I5033 Acute on chronic diastolic (congestive) heart failure: Secondary | ICD-10-CM | POA: Diagnosis not present

## 2022-10-20 DIAGNOSIS — Z853 Personal history of malignant neoplasm of breast: Secondary | ICD-10-CM | POA: Diagnosis not present

## 2022-10-20 DIAGNOSIS — I272 Pulmonary hypertension, unspecified: Secondary | ICD-10-CM | POA: Diagnosis not present

## 2022-10-20 DIAGNOSIS — M25571 Pain in right ankle and joints of right foot: Secondary | ICD-10-CM | POA: Diagnosis not present

## 2022-10-20 DIAGNOSIS — I2510 Atherosclerotic heart disease of native coronary artery without angina pectoris: Secondary | ICD-10-CM | POA: Diagnosis not present

## 2022-10-20 DIAGNOSIS — K219 Gastro-esophageal reflux disease without esophagitis: Secondary | ICD-10-CM | POA: Diagnosis not present

## 2022-10-20 DIAGNOSIS — E785 Hyperlipidemia, unspecified: Secondary | ICD-10-CM | POA: Diagnosis not present

## 2022-10-20 DIAGNOSIS — N814 Uterovaginal prolapse, unspecified: Secondary | ICD-10-CM | POA: Diagnosis not present

## 2022-10-20 DIAGNOSIS — M25512 Pain in left shoulder: Secondary | ICD-10-CM | POA: Diagnosis not present

## 2022-10-20 DIAGNOSIS — Z87440 Personal history of urinary (tract) infections: Secondary | ICD-10-CM | POA: Diagnosis not present

## 2022-10-21 DIAGNOSIS — J9601 Acute respiratory failure with hypoxia: Secondary | ICD-10-CM | POA: Diagnosis not present

## 2022-10-21 DIAGNOSIS — J189 Pneumonia, unspecified organism: Secondary | ICD-10-CM | POA: Diagnosis not present

## 2022-10-21 DIAGNOSIS — I5033 Acute on chronic diastolic (congestive) heart failure: Secondary | ICD-10-CM | POA: Diagnosis not present

## 2022-10-21 DIAGNOSIS — N1831 Chronic kidney disease, stage 3a: Secondary | ICD-10-CM | POA: Diagnosis not present

## 2022-10-21 DIAGNOSIS — I130 Hypertensive heart and chronic kidney disease with heart failure and stage 1 through stage 4 chronic kidney disease, or unspecified chronic kidney disease: Secondary | ICD-10-CM | POA: Diagnosis not present

## 2022-10-21 DIAGNOSIS — T83511D Infection and inflammatory reaction due to indwelling urethral catheter, subsequent encounter: Secondary | ICD-10-CM | POA: Diagnosis not present

## 2022-10-25 DIAGNOSIS — R338 Other retention of urine: Secondary | ICD-10-CM | POA: Diagnosis not present

## 2022-10-27 DIAGNOSIS — I130 Hypertensive heart and chronic kidney disease with heart failure and stage 1 through stage 4 chronic kidney disease, or unspecified chronic kidney disease: Secondary | ICD-10-CM | POA: Diagnosis not present

## 2022-10-27 DIAGNOSIS — I5033 Acute on chronic diastolic (congestive) heart failure: Secondary | ICD-10-CM | POA: Diagnosis not present

## 2022-10-27 DIAGNOSIS — J9601 Acute respiratory failure with hypoxia: Secondary | ICD-10-CM | POA: Diagnosis not present

## 2022-10-27 DIAGNOSIS — J189 Pneumonia, unspecified organism: Secondary | ICD-10-CM | POA: Diagnosis not present

## 2022-10-27 DIAGNOSIS — T83511D Infection and inflammatory reaction due to indwelling urethral catheter, subsequent encounter: Secondary | ICD-10-CM | POA: Diagnosis not present

## 2022-10-27 DIAGNOSIS — N1831 Chronic kidney disease, stage 3a: Secondary | ICD-10-CM | POA: Diagnosis not present

## 2022-10-31 ENCOUNTER — Other Ambulatory Visit: Payer: Self-pay | Admitting: Cardiology

## 2022-10-31 ENCOUNTER — Other Ambulatory Visit: Payer: Self-pay

## 2022-10-31 MED ORDER — FUROSEMIDE 40 MG PO TABS: 40.0000 mg | ORAL_TABLET | Freq: Every day | ORAL | 3 refills | Status: DC

## 2022-11-07 DIAGNOSIS — I872 Venous insufficiency (chronic) (peripheral): Secondary | ICD-10-CM | POA: Diagnosis not present

## 2022-11-07 DIAGNOSIS — L298 Other pruritus: Secondary | ICD-10-CM | POA: Diagnosis not present

## 2022-11-07 DIAGNOSIS — L821 Other seborrheic keratosis: Secondary | ICD-10-CM | POA: Diagnosis not present

## 2022-11-07 DIAGNOSIS — I8312 Varicose veins of left lower extremity with inflammation: Secondary | ICD-10-CM | POA: Diagnosis not present

## 2022-11-07 DIAGNOSIS — I8311 Varicose veins of right lower extremity with inflammation: Secondary | ICD-10-CM | POA: Diagnosis not present

## 2022-11-23 DIAGNOSIS — R338 Other retention of urine: Secondary | ICD-10-CM | POA: Diagnosis not present

## 2022-11-28 ENCOUNTER — Other Ambulatory Visit: Payer: Self-pay | Admitting: Cardiology

## 2022-11-30 DIAGNOSIS — D6869 Other thrombophilia: Secondary | ICD-10-CM | POA: Diagnosis not present

## 2022-11-30 DIAGNOSIS — I5189 Other ill-defined heart diseases: Secondary | ICD-10-CM | POA: Diagnosis not present

## 2022-11-30 DIAGNOSIS — R9389 Abnormal findings on diagnostic imaging of other specified body structures: Secondary | ICD-10-CM | POA: Diagnosis not present

## 2022-11-30 DIAGNOSIS — I700 Atherosclerosis of aorta: Secondary | ICD-10-CM | POA: Diagnosis not present

## 2022-11-30 DIAGNOSIS — N1832 Chronic kidney disease, stage 3b: Secondary | ICD-10-CM | POA: Diagnosis not present

## 2022-11-30 DIAGNOSIS — M109 Gout, unspecified: Secondary | ICD-10-CM | POA: Diagnosis not present

## 2022-11-30 DIAGNOSIS — I4819 Other persistent atrial fibrillation: Secondary | ICD-10-CM | POA: Diagnosis not present

## 2022-11-30 DIAGNOSIS — Z6827 Body mass index (BMI) 27.0-27.9, adult: Secondary | ICD-10-CM | POA: Diagnosis not present

## 2022-11-30 DIAGNOSIS — I340 Nonrheumatic mitral (valve) insufficiency: Secondary | ICD-10-CM | POA: Diagnosis not present

## 2022-12-05 ENCOUNTER — Other Ambulatory Visit: Payer: Self-pay | Admitting: Family Medicine

## 2022-12-05 ENCOUNTER — Ambulatory Visit
Admission: RE | Admit: 2022-12-05 | Discharge: 2022-12-05 | Disposition: A | Discharge status: Home / Self Care | Payer: Medicare Other | Source: Ambulatory Visit | Attending: Family Medicine | Admitting: Family Medicine

## 2022-12-05 ENCOUNTER — Ambulatory Visit
Admission: RE | Admit: 2022-12-05 | Discharge: 2022-12-05 | Disposition: A | Discharge status: Home / Self Care | Payer: Medicare Other | Source: Ambulatory Visit | Attending: Hematology and Oncology | Admitting: Hematology and Oncology

## 2022-12-05 DIAGNOSIS — R922 Inconclusive mammogram: Secondary | ICD-10-CM | POA: Diagnosis not present

## 2022-12-05 DIAGNOSIS — R9389 Abnormal findings on diagnostic imaging of other specified body structures: Secondary | ICD-10-CM

## 2022-12-05 DIAGNOSIS — Z853 Personal history of malignant neoplasm of breast: Secondary | ICD-10-CM | POA: Diagnosis not present

## 2022-12-05 DIAGNOSIS — I700 Atherosclerosis of aorta: Secondary | ICD-10-CM | POA: Diagnosis not present

## 2022-12-05 DIAGNOSIS — Z170 Estrogen receptor positive status [ER+]: Secondary | ICD-10-CM

## 2022-12-05 DIAGNOSIS — I517 Cardiomegaly: Secondary | ICD-10-CM | POA: Diagnosis not present

## 2022-12-13 DIAGNOSIS — R0602 Shortness of breath: Secondary | ICD-10-CM | POA: Diagnosis not present

## 2022-12-13 DIAGNOSIS — R051 Acute cough: Secondary | ICD-10-CM | POA: Diagnosis not present

## 2022-12-13 DIAGNOSIS — R509 Fever, unspecified: Secondary | ICD-10-CM | POA: Diagnosis not present

## 2022-12-13 DIAGNOSIS — J069 Acute upper respiratory infection, unspecified: Secondary | ICD-10-CM | POA: Diagnosis not present

## 2022-12-15 ENCOUNTER — Inpatient Hospital Stay (HOSPITAL_BASED_OUTPATIENT_CLINIC_OR_DEPARTMENT_OTHER)
Admission: EM | Admit: 2022-12-15 | Discharge: 2022-12-18 | DRG: 194 | Disposition: A | Discharge status: Home Health | Payer: Medicare Other | Attending: Internal Medicine | Admitting: Internal Medicine

## 2022-12-15 ENCOUNTER — Emergency Department (HOSPITAL_BASED_OUTPATIENT_CLINIC_OR_DEPARTMENT_OTHER): Payer: Medicare Other | Admitting: Radiology

## 2022-12-15 ENCOUNTER — Encounter (HOSPITAL_BASED_OUTPATIENT_CLINIC_OR_DEPARTMENT_OTHER): Payer: Self-pay | Admitting: Emergency Medicine

## 2022-12-15 ENCOUNTER — Other Ambulatory Visit: Payer: Self-pay

## 2022-12-15 DIAGNOSIS — Z1152 Encounter for screening for COVID-19: Secondary | ICD-10-CM | POA: Diagnosis not present

## 2022-12-15 DIAGNOSIS — E871 Hypo-osmolality and hyponatremia: Secondary | ICD-10-CM | POA: Diagnosis present

## 2022-12-15 DIAGNOSIS — E785 Hyperlipidemia, unspecified: Secondary | ICD-10-CM | POA: Diagnosis present

## 2022-12-15 DIAGNOSIS — Z853 Personal history of malignant neoplasm of breast: Secondary | ICD-10-CM

## 2022-12-15 DIAGNOSIS — J9610 Chronic respiratory failure, unspecified whether with hypoxia or hypercapnia: Secondary | ICD-10-CM | POA: Diagnosis present

## 2022-12-15 DIAGNOSIS — I4821 Permanent atrial fibrillation: Secondary | ICD-10-CM | POA: Diagnosis present

## 2022-12-15 DIAGNOSIS — R339 Retention of urine, unspecified: Secondary | ICD-10-CM | POA: Diagnosis present

## 2022-12-15 DIAGNOSIS — Z9011 Acquired absence of right breast and nipple: Secondary | ICD-10-CM | POA: Diagnosis not present

## 2022-12-15 DIAGNOSIS — I482 Chronic atrial fibrillation, unspecified: Secondary | ICD-10-CM | POA: Diagnosis present

## 2022-12-15 DIAGNOSIS — I4891 Unspecified atrial fibrillation: Secondary | ICD-10-CM | POA: Diagnosis not present

## 2022-12-15 DIAGNOSIS — Z79899 Other long term (current) drug therapy: Secondary | ICD-10-CM

## 2022-12-15 DIAGNOSIS — E861 Hypovolemia: Secondary | ICD-10-CM | POA: Diagnosis present

## 2022-12-15 DIAGNOSIS — Z803 Family history of malignant neoplasm of breast: Secondary | ICD-10-CM | POA: Diagnosis not present

## 2022-12-15 DIAGNOSIS — I340 Nonrheumatic mitral (valve) insufficiency: Secondary | ICD-10-CM | POA: Diagnosis present

## 2022-12-15 DIAGNOSIS — I272 Pulmonary hypertension, unspecified: Secondary | ICD-10-CM | POA: Diagnosis present

## 2022-12-15 DIAGNOSIS — R0600 Dyspnea, unspecified: Secondary | ICD-10-CM | POA: Diagnosis not present

## 2022-12-15 DIAGNOSIS — I2583 Coronary atherosclerosis due to lipid rich plaque: Secondary | ICD-10-CM | POA: Diagnosis present

## 2022-12-15 DIAGNOSIS — N3000 Acute cystitis without hematuria: Secondary | ICD-10-CM | POA: Diagnosis not present

## 2022-12-15 DIAGNOSIS — Z7901 Long term (current) use of anticoagulants: Secondary | ICD-10-CM

## 2022-12-15 DIAGNOSIS — Z8673 Personal history of transient ischemic attack (TIA), and cerebral infarction without residual deficits: Secondary | ICD-10-CM

## 2022-12-15 DIAGNOSIS — I2510 Atherosclerotic heart disease of native coronary artery without angina pectoris: Secondary | ICD-10-CM | POA: Diagnosis present

## 2022-12-15 DIAGNOSIS — Z87891 Personal history of nicotine dependence: Secondary | ICD-10-CM | POA: Diagnosis not present

## 2022-12-15 DIAGNOSIS — R6883 Chills (without fever): Secondary | ICD-10-CM

## 2022-12-15 DIAGNOSIS — R059 Cough, unspecified: Secondary | ICD-10-CM | POA: Diagnosis not present

## 2022-12-15 DIAGNOSIS — R399 Unspecified symptoms and signs involving the genitourinary system: Secondary | ICD-10-CM | POA: Diagnosis not present

## 2022-12-15 DIAGNOSIS — Z800 Family history of malignant neoplasm of digestive organs: Secondary | ICD-10-CM | POA: Diagnosis not present

## 2022-12-15 DIAGNOSIS — J189 Pneumonia, unspecified organism: Principal | ICD-10-CM | POA: Diagnosis present

## 2022-12-15 DIAGNOSIS — K219 Gastro-esophageal reflux disease without esophagitis: Secondary | ICD-10-CM | POA: Diagnosis present

## 2022-12-15 DIAGNOSIS — I5032 Chronic diastolic (congestive) heart failure: Secondary | ICD-10-CM | POA: Diagnosis present

## 2022-12-15 DIAGNOSIS — Z96653 Presence of artificial knee joint, bilateral: Secondary | ICD-10-CM | POA: Diagnosis present

## 2022-12-15 DIAGNOSIS — Z888 Allergy status to other drugs, medicaments and biological substances status: Secondary | ICD-10-CM

## 2022-12-15 DIAGNOSIS — N179 Acute kidney failure, unspecified: Secondary | ICD-10-CM | POA: Diagnosis not present

## 2022-12-15 DIAGNOSIS — I110 Hypertensive heart disease with heart failure: Secondary | ICD-10-CM | POA: Diagnosis present

## 2022-12-15 DIAGNOSIS — I10 Essential (primary) hypertension: Secondary | ICD-10-CM | POA: Diagnosis present

## 2022-12-15 DIAGNOSIS — Z823 Family history of stroke: Secondary | ICD-10-CM

## 2022-12-15 DIAGNOSIS — Z825 Family history of asthma and other chronic lower respiratory diseases: Secondary | ICD-10-CM

## 2022-12-15 DIAGNOSIS — Z923 Personal history of irradiation: Secondary | ICD-10-CM | POA: Diagnosis not present

## 2022-12-15 DIAGNOSIS — I5030 Unspecified diastolic (congestive) heart failure: Secondary | ICD-10-CM | POA: Diagnosis present

## 2022-12-15 LAB — CBC
HCT: 32.6 % — ABNORMAL LOW (ref 36.0–46.0)
Hemoglobin: 11.3 g/dL — ABNORMAL LOW (ref 12.0–15.0)
MCH: 31.9 pg (ref 26.0–34.0)
MCHC: 34.7 g/dL (ref 30.0–36.0)
MCV: 92.1 fL (ref 80.0–100.0)
Platelets: 167 10*3/uL (ref 150–400)
RBC: 3.54 MIL/uL — ABNORMAL LOW (ref 3.87–5.11)
RDW: 14.2 % (ref 11.5–15.5)
WBC: 13.2 10*3/uL — ABNORMAL HIGH (ref 4.0–10.5)
nRBC: 0 % (ref 0.0–0.2)

## 2022-12-15 LAB — URINALYSIS, ROUTINE W REFLEX MICROSCOPIC
Bilirubin Urine: NEGATIVE
Glucose, UA: NEGATIVE mg/dL
Ketones, ur: NEGATIVE mg/dL
Nitrite: NEGATIVE
Protein, ur: 30 mg/dL — AB
Specific Gravity, Urine: 1.009 (ref 1.005–1.030)
WBC, UA: >50 WBC/hpf (ref 0–5)
pH: 5.5 (ref 5.0–8.0)

## 2022-12-15 LAB — SARS CORONAVIRUS 2 BY RT PCR: SARS Coronavirus 2 by RT PCR: NEGATIVE

## 2022-12-15 LAB — BASIC METABOLIC PANEL
Anion gap: 10 (ref 5–15)
BUN: 45 mg/dL — ABNORMAL HIGH (ref 8–23)
CO2: 28 mmol/L (ref 22–32)
Calcium: 9.4 mg/dL (ref 8.9–10.3)
Chloride: 91 mmol/L — ABNORMAL LOW (ref 98–111)
Creatinine, Ser: 1.73 mg/dL — ABNORMAL HIGH (ref 0.44–1.00)
GFR, Estimated: 27 mL/min — ABNORMAL LOW (ref >60)
Glucose, Bld: 178 mg/dL — ABNORMAL HIGH (ref 70–99)
Potassium: 3.9 mmol/L (ref 3.5–5.1)
Sodium: 129 mmol/L — ABNORMAL LOW (ref 135–145)

## 2022-12-15 LAB — LACTIC ACID, PLASMA: Lactic Acid, Venous: 1.4 mmol/L (ref 0.5–1.9)

## 2022-12-15 MED ORDER — INSULIN ASPART 100 UNIT/ML IJ SOLN: 0.0000 [IU] | Freq: Every day | INTRAMUSCULAR | Status: DC

## 2022-12-15 MED ORDER — INSULIN ASPART 100 UNIT/ML IJ SOLN
0.0000 [IU] | Freq: Three times a day (TID) | INTRAMUSCULAR | Status: DC
  Administered 2022-12-16: 2 [IU] via SUBCUTANEOUS
  Administered 2022-12-17: 1 [IU] via SUBCUTANEOUS

## 2022-12-15 MED ORDER — SODIUM CHLORIDE 0.9 % IV BOLUS (SEPSIS)
500.0000 mL | Freq: Once | INTRAVENOUS | Status: AC
  Administered 2022-12-15: 500 mL via INTRAVENOUS

## 2022-12-15 MED ORDER — SODIUM CHLORIDE 0.9 % IV SOLN: INTRAVENOUS | Status: AC

## 2022-12-15 MED ORDER — SODIUM CHLORIDE 0.9 % IV SOLN
1000.0000 mL | INTRAVENOUS | Status: DC
  Administered 2022-12-15: 1000 mL via INTRAVENOUS

## 2022-12-15 MED ORDER — SODIUM CHLORIDE 0.9 % IV SOLN
1.0000 g | Freq: Once | INTRAVENOUS | Status: AC
  Administered 2022-12-15: 1 g via INTRAVENOUS
  Filled 2022-12-15: qty 10

## 2022-12-15 NOTE — ED Notes (Addendum)
Adrienne [name]  [phone] Please call when room assigned to transport husband of pt.

## 2022-12-15 NOTE — ED Notes (Signed)
Leg catheter bag emptied. Specimen collected.

## 2022-12-15 NOTE — ED Triage Notes (Signed)
Pt arrives pov, to triage in wheelchair with c/o shob, cough, fatigue and fever Afebrile In triage Recent dx x 3 days pta with Pneumonia. Pt speaking in full sentences

## 2022-12-15 NOTE — ED Notes (Signed)
Report given to the next RN... 

## 2022-12-15 NOTE — Progress Notes (Signed)
This is a 89 y.o. female with medical history significant of HTN, HLD, atrial fibrillation, CAD, pulmonary hypertension, CKD, breast cancer s/p radiation.  For the past 3 days she has been complaining of cough, congestion and fatigue. She was sent to her PCP who did outpatient chest x-ray.  She was diagnosed with pneumonia started with 2 antibiotics.  Patient has been taking her meds as prescribed for the past 24 hours.  She continues to feel poorly, endorsing nausea and mild vomiting.  Today she had persistent chills with a low-grade fever of 100.5 at home.  She came to the ER.  At [facility] ER patient's creatinine 1.7, BUN 45, sodium 129.  UA suggestive for UTI.  IV Rocephin given and admission requested.

## 2022-12-15 NOTE — ED Provider Notes (Signed)
[facility] Provider Note   CSN: [id number] Arrival date & time: 12/15/22  1749     History  Chief Complaint  Patient presents with   Fatigue    Adrienne [name] is a 89 y.o. female.  HPI Patient has history of chronic respiratory failure, pulm hypertension, atrial fibrillation, stroke, coronary artery disease, hypertension, pneumonia  Patient presents to the ED for evaluation of chills coughing fever.  Patient states her symptoms have been ongoing for this past week.  She went to see her primary doctor and had an x-ray.  She was told she had pneumonia and was started on antibiotics.  Patient just started taking them yesterday.  Patient states she continues to have fevers up to 100.5.  She also felt very chilled today and it did not stop.  She called her doctor who instructed her to come to the emergency room for evaluation.  Patient did have 1 episode of diarrhea.  She has not had any frequent vomiting.  Home Medications Prior to Admission medications   Medication Sig Start Date End Date Taking? Authorizing Provider  acetaminophen (TYLENOL) 500 MG tablet Take 500-1,000 mg by mouth every 12 (twelve) hours as needed (pain).     [provider]  albuterol (VENTOLIN HFA) 108 (90 Base) MCG/ACT inhaler Inhale 1 puff into the lungs every 6 (six) hours as needed for wheezing or shortness of breath (Cough). 09/08/22   Regalado, Belkys A, MD  allopurinol (ZYLOPRIM) 100 MG tablet Take 1 tablet (100 mg total) by mouth daily. 09/08/22   Regalado, Belkys A, MD  apixaban (ELIQUIS) 2.5 MG TABS tablet Take 1 tablet (2.5 mg total) by mouth 2 (two) times daily. 10/07/22   Jerline Pain, MD  Ascorbic Acid (VITAMIN C WITH ROSE HIPS) 500 MG tablet Take 500 mg by mouth in the morning and at bedtime.    [provider]  atorvastatin (LIPITOR) 10 MG tablet TAKE 1 TABLET DAILY 11/28/22   Jerline Pain, MD  B Complex Vitamins (VITAMIN B  COMPLEX) CAPS daily.    [provider]  diltiazem (CARDIZEM) 60 MG tablet TAKE 1 TABLET THREE TIMES A DAY 11/28/22   Jerline Pain, MD  furosemide (LASIX) 40 MG tablet Take 1 tablet (40 mg total) by mouth daily. 10/31/22   Jerline Pain, MD  metoprolol tartrate (LOPRESSOR) 100 MG tablet TAKE 1 TABLET TWICE A DAY 11/28/22   Jerline Pain, MD  Multiple Vitamin (MULTIVITAMIN WITH MINERALS) TABS tablet Take 1 tablet by mouth daily. Centrum for Women 50+    [provider]  Multiple Vitamins-Minerals (CENTRUM SILVER 50+WOMEN) TABS daily.    [provider]  omeprazole (PRILOSEC) 10 MG capsule Take 10 mg by mouth every other day. 03/21/19   [provider]  Probiotic Product (UP4 PROBIOTICS WOMENS PO) Take 1 capsule by mouth daily.    [provider]  spironolactone (ALDACTONE) 25 MG tablet Take 1 tablet (25 mg total) by mouth daily. 09/08/22   Regalado, Belkys A, MD      Allergies    Valium [diazepam], Amiodarone hcl [amiodarone], Compazine [prochlorperazine], Other, Thorazine [chlorpromazine], Zometa [zoledronic acid], Ciprofloxacin, Doxycycline, and Zoster vac recomb adjuvanted    Review of Systems   Review of Systems  Physical Exam Updated Vital Signs BP 113/60   Pulse 85   Temp 98.9 F (37.2 C) (Oral)   Resp (!) 24   Wt 60.3 kg   LMP 09/12/1973 (Approximate)  SpO2 96%   BMI 25.13 kg/m  Physical Exam Vitals and nursing note reviewed.  Constitutional:      Appearance: She is well-developed. She is not diaphoretic.  HENT:     Head: Normocephalic and atraumatic.     Right Ear: External ear normal.     Left Ear: External ear normal.  Eyes:     General: No scleral icterus.       Right eye: No discharge.        Left eye: No discharge.     Conjunctiva/sclera: Conjunctivae normal.  Neck:     Trachea: No tracheal deviation.  Cardiovascular:     Rate and Rhythm: Normal rate and regular rhythm.  Pulmonary:     Effort: Pulmonary effort is  normal. No respiratory distress.     Breath sounds: Normal breath sounds. No stridor. No wheezing or rales.  Abdominal:     General: Bowel sounds are normal. There is no distension.     Palpations: Abdomen is soft.     Tenderness: There is no abdominal tenderness. There is no guarding or rebound.  Musculoskeletal:        General: No tenderness or deformity.     Cervical back: Neck supple.     Right lower leg: No edema.     Left lower leg: No edema.  Skin:    General: Skin is warm and dry.     Findings: No rash.  Neurological:     General: No focal deficit present.     Mental Status: She is alert.     Cranial Nerves: No cranial nerve deficit, dysarthria or facial asymmetry.     Sensory: No sensory deficit.     Motor: No abnormal muscle tone or seizure activity.     Coordination: Coordination normal.  Psychiatric:        Mood and Affect: Mood normal.     ED Results / Procedures / Treatments   Labs (all labs ordered are listed, but only abnormal results are displayed) Labs Reviewed  CBC - Abnormal; Notable for the following components:      Result Value   WBC 13.2 (*)    RBC 3.54 (*)    Hemoglobin 11.3 (*)    HCT 32.6 (*)    All other components within normal limits  BASIC METABOLIC PANEL - Abnormal; Notable for the following components:   Sodium 129 (*)    Chloride 91 (*)    Glucose, Bld 178 (*)    BUN 45 (*)    Creatinine, Ser 1.73 (*)    GFR, Estimated 27 (*)    All other components within normal limits  URINALYSIS, ROUTINE W REFLEX MICROSCOPIC - Abnormal; Notable for the following components:   APPearance CLOUDY (*)    Hgb urine dipstick MODERATE (*)    Protein, ur 30 (*)    Leukocytes,Ua LARGE (*)    Bacteria, UA FEW (*)    Non Squamous Epithelial 0-5 (*)    All other components within normal limits  SARS CORONAVIRUS 2 BY RT PCR  URINE CULTURE  LACTIC ACID, PLASMA    EKG EKG Interpretation  Date/Time:  Thursday December 15 2022 18:15:45 EDT Ventricular  Rate:  88 PR Interval:    QRS Duration: 154 QT Interval:  412 QTC Calculation: 499 R Axis:   187 Text Interpretation: Atrial fibrillation Ventricular premature complex Nonspecific intraventricular conduction delay Lateral infarct, old Probable anteroseptal infarct, recent No significant change since last tracing Confirmed by Dorie Rank [phone])  on 12/15/2022 6:24:12 PM  Radiology DG Chest 2 View  Result Date: 12/15/2022 CLINICAL DATA:  Cough, dyspnea EXAM: CHEST - 2 VIEW COMPARISON:  None Available. FINDINGS: Lungs are clear. No pneumothorax or pleural effusion. Cardiac size is mildly enlarged, unchanged. Pulmonary vascularity is normal. Osseous structures are age-appropriate. Surgical clips are seen within the left axilla and left breast. IMPRESSION: 1. No active cardiopulmonary disease. Mild cardiomegaly. Electronically Signed   By: Fidela Salisbury M.D.   On: 12/15/2022 19:22    Procedures Procedures    Medications Ordered in ED Medications  sodium chloride 0.9 % bolus 500 mL (0 mLs Intravenous Stopped 12/15/22 1956)    Followed by  0.9 %  sodium chloride infusion (1,000 mLs Intravenous New Bag/Given 12/15/22 1913)  cefTRIAXone (ROCEPHIN) 1 g in sodium chloride 0.9 % 100 mL IVPB (has no administration in time range)    ED Course/ Medical Decision Making/ A&P Clinical Course as of 12/15/22 2101  Thu Dec 15, 2022  0000000 Basic metabolic panel(!) Labs notable for AKI and hyponatremia [JK]  1950 Covid negative [JK]  1950 Chest x-ray without acute findings [JK]  2032 Urinalysis does show white blood cell clumps and bacteria suggestive of urinary tract infection [JK]  2101 Case discussed with Dr Ernesto Rutherford regarding admission [JK]    Clinical Course User Index [JK] Dorie Rank, MD                             Medical Decision Making Problems Addressed: AKI (acute kidney injury): acute illness or injury that poses a threat to life or bodily functions Chills: acute illness or  injury Hyponatremia: acute illness or injury  Amount and/or Complexity of Data Reviewed Labs: ordered. Decision-making details documented in ED Course. Radiology: ordered and independent interpretation performed.  Risk Prescription drug management. Decision regarding hospitalization.   Patient presented to the ED for evaluation of chills in the setting of recent respiratory illness.  Patient was diagnosed with pneumonia as an outpatient.  She has been taking antibiotic.  Patient stated she felt very chilled today and could not get warm.  Patient is afebrile in the ED.  No oxygen requirement.  No clear signs of pneumonia on x-ray.  Leukocytosis noted but no obvious signs of infection at this time.  Will send off urinalysis.  Lactic acid level was normal arguing against evolving sepsis.  Patient does have anemia but this is stable.  Her labs however do show new hyponatremia and an AKI with an elevated BUN and creatinine.  With her hyponatremia dehydration at her age I will continue gentle IV fluid hydration.  Patient does have history of CHF and is on a sodium restricted diet.  I will consult the medical service for admission and further evaluation   UA does suggest uti.  Will cover with abx, send off urine culture       Final Clinical Impression(s) / ED Diagnoses Final diagnoses:  Hyponatremia  AKI (acute kidney injury)  Chills  Acute cystitis without hematuria    Rx / DC Orders ED Discharge Orders     None         Dorie Rank, MD 12/15/22 2102

## 2022-12-16 DIAGNOSIS — Z800 Family history of malignant neoplasm of digestive organs: Secondary | ICD-10-CM | POA: Diagnosis not present

## 2022-12-16 DIAGNOSIS — Z79899 Other long term (current) drug therapy: Secondary | ICD-10-CM | POA: Diagnosis not present

## 2022-12-16 DIAGNOSIS — K219 Gastro-esophageal reflux disease without esophagitis: Secondary | ICD-10-CM | POA: Diagnosis present

## 2022-12-16 DIAGNOSIS — Z9011 Acquired absence of right breast and nipple: Secondary | ICD-10-CM | POA: Diagnosis not present

## 2022-12-16 DIAGNOSIS — Z923 Personal history of irradiation: Secondary | ICD-10-CM | POA: Diagnosis not present

## 2022-12-16 DIAGNOSIS — E871 Hypo-osmolality and hyponatremia: Secondary | ICD-10-CM | POA: Diagnosis present

## 2022-12-16 DIAGNOSIS — I110 Hypertensive heart disease with heart failure: Secondary | ICD-10-CM | POA: Diagnosis present

## 2022-12-16 DIAGNOSIS — Z87891 Personal history of nicotine dependence: Secondary | ICD-10-CM | POA: Diagnosis not present

## 2022-12-16 DIAGNOSIS — I482 Chronic atrial fibrillation, unspecified: Secondary | ICD-10-CM | POA: Diagnosis present

## 2022-12-16 DIAGNOSIS — J189 Pneumonia, unspecified organism: Secondary | ICD-10-CM

## 2022-12-16 DIAGNOSIS — Z1152 Encounter for screening for COVID-19: Secondary | ICD-10-CM | POA: Diagnosis not present

## 2022-12-16 DIAGNOSIS — I340 Nonrheumatic mitral (valve) insufficiency: Secondary | ICD-10-CM | POA: Diagnosis present

## 2022-12-16 DIAGNOSIS — Z7901 Long term (current) use of anticoagulants: Secondary | ICD-10-CM | POA: Diagnosis not present

## 2022-12-16 DIAGNOSIS — J9610 Chronic respiratory failure, unspecified whether with hypoxia or hypercapnia: Secondary | ICD-10-CM | POA: Diagnosis present

## 2022-12-16 DIAGNOSIS — Z96653 Presence of artificial knee joint, bilateral: Secondary | ICD-10-CM | POA: Diagnosis present

## 2022-12-16 DIAGNOSIS — N3000 Acute cystitis without hematuria: Secondary | ICD-10-CM | POA: Diagnosis present

## 2022-12-16 DIAGNOSIS — I2583 Coronary atherosclerosis due to lipid rich plaque: Secondary | ICD-10-CM | POA: Diagnosis present

## 2022-12-16 DIAGNOSIS — I272 Pulmonary hypertension, unspecified: Secondary | ICD-10-CM | POA: Diagnosis present

## 2022-12-16 DIAGNOSIS — R6883 Chills (without fever): Secondary | ICD-10-CM | POA: Diagnosis not present

## 2022-12-16 DIAGNOSIS — N179 Acute kidney failure, unspecified: Secondary | ICD-10-CM | POA: Diagnosis present

## 2022-12-16 DIAGNOSIS — E861 Hypovolemia: Secondary | ICD-10-CM | POA: Diagnosis present

## 2022-12-16 DIAGNOSIS — I2510 Atherosclerotic heart disease of native coronary artery without angina pectoris: Secondary | ICD-10-CM | POA: Diagnosis present

## 2022-12-16 DIAGNOSIS — I5032 Chronic diastolic (congestive) heart failure: Secondary | ICD-10-CM | POA: Diagnosis present

## 2022-12-16 DIAGNOSIS — E785 Hyperlipidemia, unspecified: Secondary | ICD-10-CM | POA: Diagnosis present

## 2022-12-16 DIAGNOSIS — Z803 Family history of malignant neoplasm of breast: Secondary | ICD-10-CM | POA: Diagnosis not present

## 2022-12-16 LAB — GLUCOSE, CAPILLARY
Glucose-Capillary: 115 mg/dL — ABNORMAL HIGH (ref 70–99)
Glucose-Capillary: 155 mg/dL — ABNORMAL HIGH (ref 70–99)
Glucose-Capillary: 146 mg/dL — ABNORMAL HIGH (ref 70–99)

## 2022-12-16 LAB — BASIC METABOLIC PANEL
Anion gap: 9 (ref 5–15)
BUN: 37 mg/dL — ABNORMAL HIGH (ref 8–23)
CO2: 28 mmol/L (ref 22–32)
Calcium: 9.2 mg/dL (ref 8.9–10.3)
Chloride: 97 mmol/L — ABNORMAL LOW (ref 98–111)
Creatinine, Ser: 1.46 mg/dL — ABNORMAL HIGH (ref 0.44–1.00)
GFR, Estimated: 34 mL/min — ABNORMAL LOW (ref >60)
Glucose, Bld: 103 mg/dL — ABNORMAL HIGH (ref 70–99)
Potassium: 3.7 mmol/L (ref 3.5–5.1)
Sodium: 134 mmol/L — ABNORMAL LOW (ref 135–145)

## 2022-12-16 LAB — CBG MONITORING, ED
Glucose-Capillary: 186 mg/dL — ABNORMAL HIGH (ref 70–99)
Glucose-Capillary: 99 mg/dL (ref 70–99)

## 2022-12-16 LAB — HEMOGLOBIN A1C
Hgb A1c MFr Bld: 6.1 % — ABNORMAL HIGH (ref 4.8–5.6)
Mean Plasma Glucose: 128.37 mg/dL

## 2022-12-16 MED ORDER — SODIUM CHLORIDE 0.9 % IV SOLN
1.0000 g | INTRAVENOUS | Status: DC
  Administered 2022-12-16 – 2022-12-17 (×2): 1 g via INTRAVENOUS
  Filled 2022-12-16 (×2): qty 10

## 2022-12-16 MED ORDER — DILTIAZEM HCL 60 MG PO TABS
60.0000 mg | ORAL_TABLET | Freq: Three times a day (TID) | ORAL | Status: DC
  Administered 2022-12-16 – 2022-12-18 (×7): 60 mg via ORAL
  Filled 2022-12-16 (×4): qty 1
  Filled 2022-12-16: qty 2
  Filled 2022-12-16 (×3): qty 1

## 2022-12-16 MED ORDER — ATORVASTATIN CALCIUM 10 MG PO TABS
10.0000 mg | ORAL_TABLET | Freq: Every day | ORAL | Status: DC
  Filled 2022-12-16 (×2): qty 1

## 2022-12-16 MED ORDER — SODIUM CHLORIDE 0.9 % IV SOLN: INTRAVENOUS | Status: AC

## 2022-12-16 MED ORDER — PANTOPRAZOLE SODIUM 40 MG PO TBEC
40.0000 mg | DELAYED_RELEASE_TABLET | Freq: Every day | ORAL | Status: DC
  Administered 2022-12-16 – 2022-12-18 (×3): 40 mg via ORAL
  Filled 2022-12-16 (×3): qty 1

## 2022-12-16 MED ORDER — ALLOPURINOL 100 MG PO TABS
100.0000 mg | ORAL_TABLET | Freq: Every day | ORAL | Status: DC
  Filled 2022-12-16 (×2): qty 1

## 2022-12-16 MED ORDER — ONDANSETRON HCL 4 MG PO TABS: 4.0000 mg | ORAL_TABLET | Freq: Four times a day (QID) | ORAL | Status: DC | PRN

## 2022-12-16 MED ORDER — APIXABAN 2.5 MG PO TABS
2.5000 mg | ORAL_TABLET | Freq: Two times a day (BID) | ORAL | Status: DC
  Administered 2022-12-16 – 2022-12-18 (×5): 2.5 mg via ORAL
  Filled 2022-12-16 (×5): qty 1

## 2022-12-16 MED ORDER — ATORVASTATIN CALCIUM 10 MG PO TABS
10.0000 mg | ORAL_TABLET | Freq: Every day | ORAL | Status: DC
  Administered 2022-12-16 – 2022-12-17 (×2): 10 mg via ORAL
  Filled 2022-12-16: qty 1

## 2022-12-16 MED ORDER — CHLORHEXIDINE GLUCONATE CLOTH 2 % EX PADS
6.0000 | MEDICATED_PAD | Freq: Every day | CUTANEOUS | Status: DC
  Administered 2022-12-16 – 2022-12-18 (×3): 6 via TOPICAL

## 2022-12-16 MED ORDER — ADULT MULTIVITAMIN W/MINERALS CH
1.0000 | ORAL_TABLET | Freq: Every day | ORAL | Status: DC
  Administered 2022-12-16 – 2022-12-18 (×3): 1 via ORAL
  Filled 2022-12-16 (×3): qty 1

## 2022-12-16 MED ORDER — ACETAMINOPHEN 325 MG PO TABS
650.0000 mg | ORAL_TABLET | Freq: Four times a day (QID) | ORAL | Status: DC | PRN
  Administered 2022-12-16: 650 mg via ORAL
  Filled 2022-12-16: qty 2

## 2022-12-16 MED ORDER — ALBUTEROL SULFATE (2.5 MG/3ML) 0.083% IN NEBU: 2.5000 mg | INHALATION_SOLUTION | Freq: Four times a day (QID) | RESPIRATORY_TRACT | Status: DC | PRN

## 2022-12-16 MED ORDER — METOPROLOL TARTRATE 50 MG PO TABS
100.0000 mg | ORAL_TABLET | Freq: Two times a day (BID) | ORAL | Status: DC
  Administered 2022-12-16 – 2022-12-18 (×5): 100 mg via ORAL
  Filled 2022-12-16: qty 4
  Filled 2022-12-16 (×4): qty 2

## 2022-12-16 MED ORDER — ACETAMINOPHEN 650 MG RE SUPP: 650.0000 mg | Freq: Four times a day (QID) | RECTAL | Status: DC | PRN

## 2022-12-16 MED ORDER — ACETAMINOPHEN 500 MG PO TABS: 500.0000 mg | ORAL_TABLET | Freq: Two times a day (BID) | ORAL | Status: DC | PRN

## 2022-12-16 MED ORDER — ALBUTEROL SULFATE HFA 108 (90 BASE) MCG/ACT IN AERS: 1.0000 | INHALATION_SPRAY | Freq: Four times a day (QID) | RESPIRATORY_TRACT | Status: DC | PRN

## 2022-12-16 MED ORDER — VITAMIN C 500 MG PO TABS
500.0000 mg | ORAL_TABLET | Freq: Every day | ORAL | Status: DC
  Administered 2022-12-16 – 2022-12-18 (×3): 500 mg via ORAL
  Filled 2022-12-16 (×3): qty 1

## 2022-12-16 MED ORDER — ONDANSETRON HCL 4 MG/2ML IJ SOLN: 4.0000 mg | Freq: Four times a day (QID) | INTRAMUSCULAR | Status: DC | PRN

## 2022-12-16 MED ORDER — SPIRONOLACTONE 25 MG PO TABS
25.0000 mg | ORAL_TABLET | Freq: Every day | ORAL | Status: DC
  Administered 2022-12-16: 25 mg via ORAL
  Filled 2022-12-16: qty 2

## 2022-12-16 MED ORDER — FUROSEMIDE 40 MG PO TABS
40.0000 mg | ORAL_TABLET | Freq: Every day | ORAL | Status: DC
  Administered 2022-12-16: 40 mg via ORAL
  Filled 2022-12-16: qty 1

## 2022-12-16 MED ORDER — ALLOPURINOL 100 MG PO TABS
100.0000 mg | ORAL_TABLET | Freq: Every day | ORAL | Status: DC
  Administered 2022-12-16 – 2022-12-17 (×2): 100 mg via ORAL
  Filled 2022-12-16: qty 1

## 2022-12-16 NOTE — ED Notes (Signed)
Discussed pts care with EDP [place]. Informed pt continues to await for bed assignment. Per MD new orders to be placed. Primary RN Okey Regal updated.

## 2022-12-16 NOTE — H&P (Addendum)
History and Physical    Patient: Adrienne [name] LTR:[id number] DOB: 04/11/1928 DOA: 12/15/2022 DOS: the patient was seen and examined on 12/16/2022 PCP: Sigmund Hazel, MD  Patient coming from: Home  Chief Complaint:  Chief Complaint  Patient presents with   Fatigue   HPI: Adrienne [name] is a 89 y.o. female with medical history significant of chronic atrial fibrillation on apixaban, arthritis of ankle, CAD, chronic diastolic CHF, moderate mitral regurgitation, hypertension, hyperlipidemia, right breast cancer, T12 compression fracture, diverticulosis, history of TIA, history of CVA, history of urinary incontinence, history of urinary retention who presented to the emergency department with complaints of fatigue, productive cough, rhinorrhea for several days, history of being treated for pneumonia for the past 3 days after seeing her PCP and development of fever yesterday.  She has been having  occasional mild right flank pain a few days ago, but no dysuria or hematuria.  She has a chronic indwelling Foley catheter.  No fever, chills or night sweats. No sore throat, rhinorrhea, dyspnea, wheezing or hemoptysis.  No chest pain, palpitations, diaphoresis, PND, orthopnea or pitting edema of the lower extremities.  Her appetite has been decreased, no abdominal pain, occasional diarrhea about negative with constipation, no melena or hematochezia.  No polyuria, polydipsia, polyphagia or blurred vision.  Lab work: Her urinalysis was cloudy with moderate hemoglobin, proteinuria 30 mg deciliter and large leukocyte esterase.  Microscopic examination shows more than 50 WBC with WBC clumps and a few bacteria.  Coronavirus PCR was negative.  CBC is her white count 13.2, hemoglobin 11.3 g/dL platelets 686.  BMP showed a sodium 129 and chloride of 91 mmol/L.  The rest of the electrolytes were normal.  Glucose 178, BUN 45 and creatinine 1.73 mg/dL.  Her baseline creatinine is usually under 1.0 mg/dL.   Repeat BMP now showing sodium 134 mmol/L, glucose of 103 and creatinine of 1.46 mg/dL.  Hemoglobin A1c was 6.1%.  Imaging: Two-view portable chest radiograph with no active cardiopulmonary disease.  There was mild cardiomegaly.  ED course: Initial vital signs were temperature 98.9 F, pulse 93, respiration 19, BP 116/64 mmHg O2 sat 97% on room air.  The patient received NS 500 mL bolus and ceftriaxone 1 g IVPB.   Review of Systems: As mentioned in the history of present illness. All other systems reviewed and are negative. Past Medical History:  Diagnosis Date   Afib    Arthritis of ankle    CAD (coronary artery disease)    Cancer 2012   breast cancer-right   Compression fracture of T12 vertebra    Diverticulosis    Edema    Elevated uric acid in blood    GERD (gastroesophageal reflux disease)    GI bleed 12/2015   Hyperlipidemia    Hypertension    Long-term (current) use of anticoagulants    Moderate mitral regurgitation    Personal history of radiation therapy    Pulmonary HTN    Echo 1/19: EF 60-65, no RWMA, Ao sclerosis, trivial AI, mild MR, mod BAE, mod TR, PASP 49   Stroke 01/25/2016   TIA (transient ischemic attack) 09/2011   Urinary incontinence    Urinary retention    Past Surgical History:  Procedure Laterality Date   APPENDECTOMY     AXILLARY SENTINEL NODE BIOPSY Left 01/22/2020   Procedure: Left Axillary Sentinel Node Biopsy;  Surgeon: Emelia Loron, MD;  Location: [facility] OR;  Service: General;  Laterality: Left;   BREAST LUMPECTOMY WITH RADIOACTIVE SEED AND SENTINEL LYMPH  NODE BIOPSY Left 01/22/2020   Procedure: LEFT BREAST LUMPECTOMY WITH RADIOACTIVE SEED;  Surgeon: Emelia Loron, MD;  Location: [facility] OR;  Service: General;  Laterality: Left;   BREAST SURGERY  2012   Rt.mastectomy--[place], [place]   CARDIAC CATHETERIZATION  2013   CATARACT EXTRACTION, BILATERAL     CERVICAL CONIZATION W/BX N/A 08/08/2020   Procedure: VAGINAL SIDEWALL BIOPSY;  Surgeon: Jerene Bears, MD;  Location: [facility] OR;  Service: Gynecology;  Laterality: N/A;   DILATION AND CURETTAGE OF UTERUS     in her early 53's for DUB   ESOPHAGOGASTRODUODENOSCOPY Left 01/08/2016   Procedure: ESOPHAGOGASTRODUODENOSCOPY (EGD);  Surgeon: Willis Modena, MD;  Location: [facility];  Service: Endoscopy;  Laterality: Left;   GALLBLADDER SURGERY     MASTECTOMY Right 2012   RADIOLOGY WITH ANESTHESIA N/A 01/25/2016   Procedure: RADIOLOGY WITH ANESTHESIA;  Surgeon: Julieanne Cotton, MD;  Location: [facility] OR;  Service: Radiology;  Laterality: N/A;   REPLACEMENT TOTAL KNEE BILATERAL     TONSILLECTOMY AND ADENOIDECTOMY     as a child   Social History:  reports that she quit smoking about 74 years ago. Her smoking use included cigarettes. She has a 0.50 pack-year smoking history. She has never used smokeless tobacco. She reports that she does not drink alcohol and does not use drugs.  Allergies  Allergen Reactions   Valium [Diazepam] Other (See Comments)    HYPERACTIVITY    Amiodarone Hcl [Amiodarone] Other (See Comments)    AFIB   Compazine [Prochlorperazine] Other (See Comments)    HYPERACTIVITY    Other Other (See Comments)    "Kidney dye"--patient states this gave her severe shakes/chills   Thorazine [Chlorpromazine] Other (See Comments)    HYPERACTIVITY    Zometa [Zoledronic Acid] Other (See Comments)    PROBLEMS WITH EYES  TIA   Ciprofloxacin Swelling    "swelling" in Rt.elbow   Doxycycline Nausea Only   Zoster Vac Recomb Adjuvanted     Other Reaction(s): wel under injection    Family History  Problem Relation Age of Onset   Asthma Mother    CVA Father    Atrial fibrillation Sister        HAS PACER   Pneumonia Brother    Cancer Daughter        COLON CANCER   Cancer Maternal Grandmother        breast cancer    Prior to Admission medications   Medication Sig Start Date End Date Taking? Authorizing Provider  amoxicillin-clavulanate (AUGMENTIN) 875-125 MG tablet Take 1 tablet  by mouth 2 (two) times daily. 12/14/22  Yes [provider]  azithromycin (ZITHROMAX) 250 MG tablet Take 250 mg by mouth as directed. 12/14/22  Yes [provider]  acetaminophen (TYLENOL) 500 MG tablet Take 500-1,000 mg by mouth every 12 (twelve) hours as needed (pain).     [provider]  albuterol (VENTOLIN HFA) 108 (90 Base) MCG/ACT inhaler Inhale 1 puff into the lungs every 6 (six) hours as needed for wheezing or shortness of breath (Cough). 09/08/22   Regalado, Belkys A, MD  allopurinol (ZYLOPRIM) 100 MG tablet Take 1 tablet (100 mg total) by mouth daily. 09/08/22   Regalado, Belkys A, MD  apixaban (ELIQUIS) 2.5 MG TABS tablet Take 1 tablet (2.5 mg total) by mouth 2 (two) times daily. 10/07/22   Jake Bathe, MD  Ascorbic Acid (VITAMIN C WITH ROSE HIPS) 500 MG tablet Take 500 mg by mouth in the morning and at bedtime.  [provider]  atorvastatin (LIPITOR) 10 MG tablet TAKE 1 TABLET DAILY 11/28/22   Jake BatheSkains, Mark C, MD  B Complex Vitamins (VITAMIN B COMPLEX) CAPS daily.    [provider]  diltiazem (CARDIZEM) 60 MG tablet TAKE 1 TABLET THREE TIMES A DAY 11/28/22   Jake BatheSkains, Mark C, MD  furosemide (LASIX) 40 MG tablet Take 1 tablet (40 mg total) by mouth daily. 10/31/22   Jake BatheSkains, Mark C, MD  metoprolol tartrate (LOPRESSOR) 100 MG tablet TAKE 1 TABLET TWICE A DAY 11/28/22   Jake BatheSkains, Mark C, MD  Multiple Vitamin (MULTIVITAMIN WITH MINERALS) TABS tablet Take 1 tablet by mouth daily. Centrum for Women 50+    [provider]  Multiple Vitamins-Minerals (CENTRUM SILVER 50+WOMEN) TABS daily.    [provider]  omeprazole (PRILOSEC) 10 MG capsule Take 10 mg by mouth every other day. 03/21/19   [provider]  Probiotic Product (UP4 PROBIOTICS WOMENS PO) Take 1 capsule by mouth daily.    [provider]  spironolactone (ALDACTONE) 25 MG tablet Take 1 tablet (25 mg total) by mouth daily. 09/08/22   Alba Coryegalado, Belkys A, MD     Physical Exam: Vitals:   12/16/22 0846 12/16/22 0855 12/16/22 1018 12/16/22 1100  BP:  131/67  (!) 127/58  Pulse: 87 92 85 62  Resp:  (!) 26  (!) 22  Temp:   98.1 F (36.7 C) 98 F (36.7 C)  TempSrc:   Oral Oral  SpO2:  98% 99% 99%  Weight:      Height:    5\' 1"  (1.549 m)   Physical Exam Vitals reviewed.  Constitutional:      General: She is awake. She is not in acute distress.    Appearance: She is ill-appearing.  HENT:     Head: Normocephalic.     Nose: No rhinorrhea.     Mouth/Throat:     Mouth: Mucous membranes are moist.  Eyes:     General: No scleral icterus.    Pupils: Pupils are equal, round, and reactive to light.  Neck:     Vascular: No JVD.  Cardiovascular:     Rate and Rhythm: Normal rate.     Heart sounds: S1 normal and S2 normal.  Pulmonary:     Effort: Pulmonary effort is normal. No accessory muscle usage.     Breath sounds: Examination of the right-lower field reveals rales. Rales present. No wheezing or rhonchi.  Abdominal:     General: Bowel sounds are normal. There is no distension.     Palpations: Abdomen is soft.     Tenderness: There is no abdominal tenderness. There is no guarding.  Musculoskeletal:     Cervical back: Neck supple.     Right lower leg: No edema.     Left lower leg: No edema.  Skin:    General: Skin is warm and dry.  Neurological:     General: No focal deficit present.     Mental Status: She is alert and oriented to person, place, and time.  Psychiatric:        Mood and Affect: Mood normal.        Behavior: Behavior normal. Behavior is cooperative.     Data Reviewed:  There are no new results to review at this time.  09/04/2022 echocardiogram IMPRESSIONS:   1. Left ventricular ejection fraction, by estimation, is 50 to 55%. The  left ventricle has low normal function. The left ventricle demonstrates  global hypokinesis. There is  moderate left ventricular hypertrophy of the  basal-septal segment. Left   ventricular diastolic function could not be evaluated.   2. Right ventricular systolic function is mildly reduced. The right  ventricular size is moderately enlarged.   3. Left atrial size was mildly dilated.   4. Right atrial size was mild to moderately dilated.   5. The mitral valve is degenerative. Moderate to severe mitral valve  regurgitation. No evidence of mitral stenosis.   6. The aortic valve is tricuspid. Aortic valve regurgitation is not  visualized. Aortic valve sclerosis/calcification is present, without any  evidence of aortic stenosis.   EKG: Vent. rate 88 BPM PR interval * ms QRS duration 154 ms QT/QTcB 412/499 ms P-R-T axes * 187 56 Atrial fibrillation Ventricular premature complex Nonspecific intraventricular conduction delay Lateral infarct, old Probable anteroseptal infarct, recent  Assessment and Plan: Principal Problem:   CAP (community acquired pneumonia) Admit to PCU/inpatient. Continue supplemental oxygen. As needed bronchodilators. Continue ceftriaxone 1 g IVPB daily. Check strep pneumoniae urinary antigen. Check sputum Gram stain, culture and sensitivity. Follow-up blood culture and sensitivity. Follow-up CBC and chemistry in the morning.  Active Problems:   Acute cystitis Occasional flank pain.   No significant symptoms otherwise. Chronic indwelling catheter. She is on ceftriaxone for pneumonia.    Hyponatremia Due to recent GI losses. Improving. Follow-up sodium level.    AKI (acute kidney injury) Continue IV fluids. Hold diuretics. Avoid hypotension. Avoid nephrotoxins. Monitor intake and output. Monitor renal function electrolytes.    Chronic atrial fibrillation CHA2DS2-VASc Score of 9. Continue apixaban 2.5 mg p.o. twice daily. Continue diltiazem 60 mg p.o. 3 times daily. Continue metoprolol 100 mg p.o. twice daily.    Chronic diastolic heart failure No signs of decompensation. Continue beta-blocker. Hold diuretics for  now.    Hyperlipidemia Continue atorvastatin 10 mg p.o. daily.    Essential hypertension On beta and calcium channel blocker as above. Monitor blood pressure.    Coronary artery disease due to lipid rich plaque Continue DOAC, statin, beta-blocker.    GERD (gastroesophageal reflux disease) Continue pantoprazole 40 mg p.o. daily.      Advance Care Planning:   Code Status: Full Code   Consults:   Family Communication:   Severity of Illness: The appropriate patient status for this patient is INPATIENT. Inpatient status is judged to be reasonable and necessary in order to provide the required intensity of service to ensure the patient's safety. The patient's presenting symptoms, physical exam findings, and initial radiographic and laboratory data in the context of their chronic comorbidities is felt to place them at high risk for further clinical deterioration. Furthermore, it is not anticipated that the patient will be medically stable for discharge from the hospital within 2 midnights of admission.   * I certify that at the point of admission it is my clinical judgment that the patient will require inpatient hospital care spanning beyond 2 midnights from the point of admission due to high intensity of service, high risk for further deterioration and high frequency of surveillance required.*  Author: Bobette Mo, MD 12/16/2022 11:22 AM  For on call review [email or web address].

## 2022-12-16 NOTE — ED Notes (Signed)
Talked with charge nurse on 3rd floor at [facility]. RN at [facility] has not been assigned yet. Will call back at shift change

## 2022-12-16 NOTE — ED Notes (Signed)
Called [facility] to give report. First shift RN not there yet.

## 2022-12-16 NOTE — ED Notes (Signed)
After 0700 it will be Adrienne [name].

## 2022-12-17 DIAGNOSIS — J189 Pneumonia, unspecified organism: Secondary | ICD-10-CM | POA: Diagnosis not present

## 2022-12-17 DIAGNOSIS — N179 Acute kidney failure, unspecified: Secondary | ICD-10-CM | POA: Diagnosis not present

## 2022-12-17 DIAGNOSIS — N3000 Acute cystitis without hematuria: Secondary | ICD-10-CM

## 2022-12-17 DIAGNOSIS — E871 Hypo-osmolality and hyponatremia: Secondary | ICD-10-CM

## 2022-12-17 LAB — COMPREHENSIVE METABOLIC PANEL
ALT: 22 U/L (ref 0–44)
AST: 18 U/L (ref 15–41)
Albumin: 2.6 g/dL — ABNORMAL LOW (ref 3.5–5.0)
Alkaline Phosphatase: 65 U/L (ref 38–126)
Anion gap: 7 (ref 5–15)
BUN: 37 mg/dL — ABNORMAL HIGH (ref 8–23)
CO2: 25 mmol/L (ref 22–32)
Calcium: 8 mg/dL — ABNORMAL LOW (ref 8.9–10.3)
Chloride: 103 mmol/L (ref 98–111)
Creatinine, Ser: 1.52 mg/dL — ABNORMAL HIGH (ref 0.44–1.00)
GFR, Estimated: 32 mL/min — ABNORMAL LOW (ref >60)
Glucose, Bld: 106 mg/dL — ABNORMAL HIGH (ref 70–99)
Potassium: 3.8 mmol/L (ref 3.5–5.1)
Sodium: 135 mmol/L (ref 135–145)
Total Bilirubin: 0.6 mg/dL (ref 0.3–1.2)
Total Protein: 5.8 g/dL — ABNORMAL LOW (ref 6.5–8.1)

## 2022-12-17 LAB — GLUCOSE, CAPILLARY
Glucose-Capillary: 81 mg/dL (ref 70–99)
Glucose-Capillary: 148 mg/dL — ABNORMAL HIGH (ref 70–99)
Glucose-Capillary: 164 mg/dL — ABNORMAL HIGH (ref 70–99)

## 2022-12-17 LAB — CBC
HCT: 32.1 % — ABNORMAL LOW (ref 36.0–46.0)
Hemoglobin: 10.6 g/dL — ABNORMAL LOW (ref 12.0–15.0)
MCH: 31.5 pg (ref 26.0–34.0)
MCHC: 33 g/dL (ref 30.0–36.0)
MCV: 95.3 fL (ref 80.0–100.0)
Platelets: 178 10*3/uL (ref 150–400)
RBC: 3.37 MIL/uL — ABNORMAL LOW (ref 3.87–5.11)
RDW: 14.5 % (ref 11.5–15.5)
WBC: 10.9 10*3/uL — ABNORMAL HIGH (ref 4.0–10.5)
nRBC: 0 % (ref 0.0–0.2)

## 2022-12-17 LAB — RESPIRATORY PANEL BY PCR

## 2022-12-17 LAB — URINE CULTURE

## 2022-12-17 LAB — STREP PNEUMONIAE URINARY ANTIGEN: Strep Pneumo Urinary Antigen: NEGATIVE

## 2022-12-17 MED ORDER — SODIUM CHLORIDE 0.9 % IV SOLN: INTRAVENOUS | Status: AC

## 2022-12-17 MED ORDER — SODIUM CHLORIDE 0.9 % IV SOLN: INTRAVENOUS | Status: DC

## 2022-12-17 NOTE — Evaluation (Signed)
Physical Therapy Evaluation Patient Details Name: Cionna Heffner MRN: [id number] DOB: 05-10-1928 Today's Date: 12/17/2022  History of Present Illness  89 yo female admitted with Pna. Hx of Afib, CAD, pulm HTN, CKD, breast Ca, CHF, CVA, TIA, chronic catheter, T12 comp fx  Clinical Impression  On eval, pt was Supv level for mobility. She walked ~165 feet with use of a rollator. Pt tolerated activity fairly well-dyspnea 3/4 by end of walk. O2 95% on RA. Pt c/o general weakness during session. Will plan to follow and progress activity as tolerated.        Recommendations for follow up therapy are one component of a multi-disciplinary discharge planning process, led by the attending physician.  Recommendations may be updated based on patient status, additional functional criteria and insurance authorization.  Follow Up Recommendations       Assistance Recommended at Discharge PRN  Patient can return home with the following  Assist for transportation;Assistance with cooking/housework;Help with stairs or ramp for entrance    Equipment Recommendations None recommended by PT  Recommendations for Other Services       Functional Status Assessment Patient has had a recent decline in their functional status and demonstrates the ability to make significant improvements in function in a reasonable and predictable amount of time.     Precautions / Restrictions Precautions Precautions: Fall Restrictions Weight Bearing Restrictions: No      Mobility  Bed Mobility Overal bed mobility: Modified Independent                  Transfers Overall transfer level: Needs assistance Equipment used: Rollator (4 wheels) Transfers: Sit to/from Stand Sit to Stand: Supervision           General transfer comment: Cues for proper operation of rollator    Ambulation/Gait Ambulation/Gait assistance: Supervision Gait Distance (Feet): 165 Feet Assistive device: Rollator (4  wheels) Gait Pattern/deviations: Step-through pattern, Decreased stride length       General Gait Details: Supv for safety. O2 95% on RA. Dyspnea 3/4 after walk  Stairs            Wheelchair Mobility    Modified Rankin (Stroke Patients Only)       Balance Overall balance assessment: Mild deficits observed, not formally tested                                           Pertinent Vitals/Pain Pain Assessment Pain Assessment: No/denies pain    Home Living Family/patient expects to be discharged to:: Private residence Living Arrangements: Spouse/significant other Available Help at Discharge: Family;Available 24 hours/day Type of Home: House Home Access: Level entry       Home Layout: One level Home Equipment: Agricultural consultant (2 wheels);Rollator (4 wheels);Shower seat      Prior Function Prior Level of Function : Independent/Modified Independent             Mobility Comments: used a rollator outside but no device use inside home ADLs Comments: groceries delivered, indep with dressing/bathing     Hand Dominance   Dominant Hand: Right    Extremity/Trunk Assessment   Upper Extremity Assessment Upper Extremity Assessment: Generalized weakness    Lower Extremity Assessment Lower Extremity Assessment: Generalized weakness    Cervical / Trunk Assessment Cervical / Trunk Assessment: Kyphotic  Communication   Communication: HOH  Cognition Arousal/Alertness: Awake/alert Behavior During Therapy: WFL for  tasks assessed/performed Overall Cognitive Status: Within Functional Limits for tasks assessed                                          General Comments      Exercises     Assessment/Plan    PT Assessment Patient needs continued PT services  PT Problem List Decreased strength;Decreased balance;Decreased mobility;Decreased activity tolerance;Decreased knowledge of use of DME       PT Treatment Interventions DME  instruction;Functional mobility training;Balance training;Patient/family education;Gait training;Therapeutic activities;Therapeutic exercise    PT Goals (Current goals can be found in the Care Plan section)  Acute Rehab PT Goals Patient Stated Goal: to get better PT Goal Formulation: With patient/family Time For Goal Achievement: 12/31/22 Potential to Achieve Goals: Good    Frequency Min 3X/week     Co-evaluation               AM-PAC PT "6 Clicks" Mobility  Outcome Measure Help needed turning from your back to your side while in a flat bed without using bedrails?: None Help needed moving from lying on your back to sitting on the side of a flat bed without using bedrails?: None Help needed moving to and from a bed to a chair (including a wheelchair)?: None Help needed standing up from a chair using your arms (e.g., wheelchair or bedside chair)?: None Help needed to walk in hospital room?: A Little Help needed climbing 3-5 steps with a railing? : A Little 6 Click Score: 22    End of Session Equipment Utilized During Treatment: Gait belt Activity Tolerance: Patient limited by fatigue;Patient tolerated treatment well Patient left: in bed;with call bell/phone within reach;with family/visitor present;with bed alarm set   PT Visit Diagnosis: Difficulty in walking, not elsewhere classified (R26.2);Muscle weakness (generalized) (M62.81)    Time: 1610-9604 PT Time Calculation (min) (ACUTE ONLY): 18 min   Charges:   PT Evaluation $PT Eval Low Complexity: 1 Low           Faye Ramsay, PT Acute Rehabilitation  Office: [phone]

## 2022-12-17 NOTE — Progress Notes (Signed)
[facility] PROGRESS NOTE    Progress Note  Adrienne [name]  PFX:[id number] DOB: [birth year] DOA: 12/15/2022 PCP: Adrienne Hazel, MD     Brief Narrative:   Adrienne [name] is an 89 y.o. female past medical history significant for chronic atrial fibrillation on apixaban, chronic diastolic heart failure with moderate mitral regurgitation right breast cancer, history of T12 compression fracture, history of CVA comes into the emergency room complaining of fatigue productive cough and rhinorrhea for several days prior to admission.  Her primary care doctor has been treating her for pneumonia with antibiotics for the past 3 days but started developing fevers the day prior to admission   Assessment/Plan:   CAP (community acquired pneumonia) Has remained afebrile here in the hospital satting greater 97% started empirically on IV Rocephin, leukocytosis improving. SARS-CoV-2 PCR is negative. Strep pneumo urine antigen pending. Sputum culture and Gram stain have been sent. Blood cultures negative till date.  Acute cystitis: She has a chronic indwelling cath catheter. Started empirically on Rocephin urine cultures have been sent.  Hypovolemic hyponatremia: Likely due to GI losses resolved with IV fluid resuscitation.  Acute kidney injury: Likely prerenal azotemia hold diuretics and antihypertensive medication. With a baseline creatinine of less than 1 on admission 1.7 continue IV fluids for an additional 24 hours. Recheck basic metabolic panel in the morning.  Chronic atrial fibrillation With a chads Vascor of 9 continue apixaban diltiazem and metoprolol. Rate controlled.  Chronic diastolic heart failure: Continue beta-blockers no signs of decompensation hold diuretics.  Hyperlipidemia :  continue statins.  Essential hypertension: Continue metoprolol and diltiazem.  Coronary artery disease: Continue DOACs statins and beta-blockers.   DVT prophylaxis:  lovnox Family Communication:none Status is: Inpatient Remains inpatient appropriate because: Acute kidney injury.    Code Status:     Code Status Orders  (From admission, onward)           Start     Ordered   12/16/22 1128  Full code  Continuous       Question:  By:  Answer:  Consent: discussion documented in EHR   12/16/22 1128           Code Status History     Date Active Date Inactive Code Status Order ID Comments User Context   09/03/2022 1101 09/09/2022 2004 Full Code [id number]  Clydie Braun, MD ED   08/17/2021 1836 08/19/2021 1723 Full Code [id number]  Synetta Fail, MD ED   08/08/2020 0617 08/12/2020 0202 Full Code [id number]  Jerene Bears, MD ED   01/22/2020 1349 01/23/2020 1519 Full Code [id number]  Emelia Loron, MD Inpatient   01/29/2016 1808 02/05/2016 1434 Full Code [id number]  Charlton Amor, PA-C Inpatient   01/29/2016 1808 01/29/2016 1808 Full Code [id number]  Charlton Amor, PA-C Inpatient   01/25/2016 1542 01/29/2016 1808 Full Code [id number]  Julieanne Cotton, MD Inpatient   01/25/2016 0937 01/25/2016 1542 Full Code [id number]  Lisette Grinder, MD ED   01/06/2016 1549 01/10/2016 1912 Full Code [id number]  Jeralyn Bennett, MD Inpatient      Advance Directive Documentation    Flowsheet Row Most Recent Value  Type of Advance Directive Healthcare Power of Attorney, Living will  Pre-existing out of facility DNR order (yellow form or pink MOST form) --  "MOST" Form in Place? --         IV Access:   Peripheral IV   Procedures and diagnostic studies:   DG Chest 2 View  Result Date:  12/15/2022 CLINICAL DATA:  Cough, dyspnea EXAM: CHEST - 2 VIEW COMPARISON:  None Available. FINDINGS: Lungs are clear. No pneumothorax or pleural effusion. Cardiac size is mildly enlarged, unchanged. Pulmonary vascularity is normal. Osseous structures are age-appropriate. Surgical clips are seen within the left axilla and left breast. IMPRESSION: 1.  No active cardiopulmonary disease. Mild cardiomegaly. Electronically Signed   By: Helyn Numbers M.D.   On: 12/15/2022 19:22     Medical Consultants:   None.   Subjective:    [name] no complaints feels better  Objective:    Vitals:   12/16/22 1403 12/16/22 1743 12/16/22 2203 12/17/22 0540  BP: 138/62 130/66 139/69 (!) 144/75  Pulse: 70 64 76 62  Resp: 18 18 16 18   Temp: (!) 97.4 F (36.3 C) 97.8 F (36.6 C) 98 F (36.7 C) 97.9 F (36.6 C)  TempSrc: Oral Oral Oral Oral  SpO2: 100% 97% 97% 97%  Weight:      Height:       SpO2: 97 %   Intake/Output Summary (Last 24 hours) at 12/17/2022 0751 Last data filed at 12/16/2022 1744 Gross per 24 hour  Intake 258.55 ml  Output 1975 ml  Net -1716.45 ml   Filed Weights   12/15/22 1808  Weight: 60.3 kg    Exam: General exam: In no acute distress. Respiratory system: Good air movement and clear to auscultation. Cardiovascular system: S1 & S2 heard, RRR. No JVD. Gastrointestinal system: Abdomen is nondistended, soft and nontender.  Extremities: No pedal edema. Skin: No rashes, lesions or ulcers Psychiatry: Judgement and insight appear normal. Mood & affect appropriate.    Data Reviewed:    Labs: Basic Metabolic Panel: Recent Labs  Lab 12/15/22 1855 12/16/22 0917 12/17/22 0405  NA 129* 134* 135  K 3.9 3.7 3.8  CL 91* 97* 103  CO2 28 28 25   GLUCOSE 178* 103* 106*  BUN 45* 37* 37*  CREATININE 1.73* 1.46* 1.52*  CALCIUM 9.4 9.2 8.0*   GFR Estimated Creatinine Clearance: 19.7 mL/min (A) (by C-G formula based on SCr of 1.52 mg/dL (H)). Liver Function Tests: Recent Labs  Lab 12/17/22 0405  AST 18  ALT 22  ALKPHOS 65  BILITOT 0.6  PROT 5.8*  ALBUMIN 2.6*   No results for input(s): "LIPASE", "AMYLASE" in the last 168 hours. No results for input(s): "AMMONIA" in the last 168 hours. Coagulation profile No results for input(s): "INR", "PROTIME" in the last 168 hours. COVID-19 Labs  No  results for input(s): "DDIMER", "FERRITIN", "LDH", "CRP" in the last 72 hours.  Lab Results  Component Value Date   SARSCOV2NAA NEGATIVE 12/15/2022   SARSCOV2NAA NEGATIVE 09/03/2022   SARSCOV2NAA NEGATIVE 08/17/2021   SARSCOV2NAA NEGATIVE 09/09/2020    CBC: Recent Labs  Lab 12/15/22 1855 12/17/22 0405  WBC 13.2* 10.9*  HGB 11.3* 10.6*  HCT 32.6* 32.1*  MCV 92.1 95.3  PLT 167 178   Cardiac Enzymes: No results for input(s): "CKTOTAL", "CKMB", "CKMBINDEX", "TROPONINI" in the last 168 hours. BNP (last 3 results) No results for input(s): "PROBNP" in the last 8760 hours. CBG: Recent Labs  Lab 12/15/22 2202 12/16/22 0906 12/16/22 1327 12/16/22 1612 12/16/22 2204  GLUCAP 186* 99 115* 155* 146*   D-Dimer: No results for input(s): "DDIMER" in the last 72 hours. Hgb A1c: Recent Labs    12/15/22 2105  HGBA1C 6.1*   Lipid Profile: No results for input(s): "CHOL", "HDL", "LDLCALC", "TRIG", "CHOLHDL", "LDLDIRECT" in the last 72 hours. Thyroid function studies: No results  for input(s): "TSH", "T4TOTAL", "T3FREE", "THYROIDAB" in the last 72 hours.  Invalid input(s): "FREET3" Anemia work up: No results for input(s): "VITAMINB12", "FOLATE", "FERRITIN", "TIBC", "IRON", "RETICCTPCT" in the last 72 hours. Sepsis Labs: Recent Labs  Lab 12/15/22 1855 12/17/22 0405  WBC 13.2* 10.9*  LATICACIDVEN 1.4  --    Microbiology Recent Results (from the past 240 hour(s))  SARS Coronavirus 2 by RT PCR (hospital order, performed [facility]) *cepheid single result test* Anterior Nasal Swab     Status: None   Collection Time: 12/15/22  7:16 PM   Specimen: Anterior Nasal Swab  Result Value Ref Range Status   SARS Coronavirus 2 by RT PCR NEGATIVE NEGATIVE Final    Comment: (NOTE) SARS-CoV-2 target nucleic acids are NOT DETECTED.  The SARS-CoV-2 RNA is generally detectable in upper and lower respiratory specimens during the acute phase of infection. The  lowest concentration of SARS-CoV-2 viral copies this assay can detect is 250 copies / mL. A negative result does not preclude SARS-CoV-2 infection and should not be used as the sole basis for treatment or other patient management decisions.  A negative result may occur with improper specimen collection / handling, submission of specimen other than nasopharyngeal swab, presence of viral mutation(s) within the areas targeted by this assay, and inadequate number of viral copies (<250 copies / mL). A negative result must be combined with clinical observations, patient history, and epidemiological information.  Fact Sheet for Patients:   RoadLapTop.co.zahttps://www.fda.gov/media/158405/download  Fact Sheet for Healthcare Providers: http://kim-miller.com/https://www.fda.gov/media/158404/download  This test is not yet approved or  cleared by the [place] FDA and has been authorized for detection and/or diagnosis of SARS-CoV-2 by FDA under an Emergency Use Authorization (EUA).  This EUA will remain in effect (meaning this test can be used) for the duration of the COVID-19 declaration under Section 564(b)(1) of the Act, 21 U.S.C. section 360bbb-3(b)(1), unless the authorization is terminated or revoked sooner.  Performed at [facility], [address], [place], [place] [address]      Medications:    allopurinol  100 mg Oral q1800   apixaban  2.5 mg Oral BID   vitamin C with rose hips  500 mg Oral Daily   atorvastatin  10 mg Oral q1800   Chlorhexidine Gluconate Cloth  6 each Topical Daily   diltiazem  60 mg Oral TID   insulin aspart  0-5 Units Subcutaneous QHS   insulin aspart  0-9 Units Subcutaneous TID WC   metoprolol tartrate  100 mg Oral BID   multivitamin with minerals  1 tablet Oral Daily   pantoprazole  40 mg Oral Daily   Continuous Infusions:  cefTRIAXone (ROCEPHIN)  IV 1 g (12/16/22 2222)      LOS: 1 day   Marinda ElkAbraham Feliz Ortiz  [facility]  12/17/2022, 7:51 AM

## 2022-12-18 DIAGNOSIS — R6883 Chills (without fever): Secondary | ICD-10-CM

## 2022-12-18 DIAGNOSIS — J189 Pneumonia, unspecified organism: Secondary | ICD-10-CM | POA: Diagnosis not present

## 2022-12-18 DIAGNOSIS — N3000 Acute cystitis without hematuria: Secondary | ICD-10-CM | POA: Diagnosis not present

## 2022-12-18 DIAGNOSIS — N179 Acute kidney failure, unspecified: Secondary | ICD-10-CM | POA: Diagnosis not present

## 2022-12-18 DIAGNOSIS — E871 Hypo-osmolality and hyponatremia: Secondary | ICD-10-CM | POA: Diagnosis not present

## 2022-12-18 LAB — BASIC METABOLIC PANEL
Anion gap: 6 (ref 5–15)
BUN: 29 mg/dL — ABNORMAL HIGH (ref 8–23)
CO2: 25 mmol/L (ref 22–32)
Calcium: 8.2 mg/dL — ABNORMAL LOW (ref 8.9–10.3)
Chloride: 105 mmol/L (ref 98–111)
Creatinine, Ser: 1.15 mg/dL — ABNORMAL HIGH (ref 0.44–1.00)
GFR, Estimated: 45 mL/min — ABNORMAL LOW (ref >60)
Glucose, Bld: 85 mg/dL (ref 70–99)
Potassium: 4.5 mmol/L (ref 3.5–5.1)
Sodium: 136 mmol/L (ref 135–145)

## 2022-12-18 LAB — GLUCOSE, CAPILLARY
Glucose-Capillary: 93 mg/dL (ref 70–99)
Glucose-Capillary: 168 mg/dL — ABNORMAL HIGH (ref 70–99)

## 2022-12-18 MED ORDER — AMOXICILLIN-POT CLAVULANATE 500-125 MG PO TABS
1.0000 | ORAL_TABLET | Freq: Two times a day (BID) | ORAL | Status: DC
  Administered 2022-12-18: 1 via ORAL
  Filled 2022-12-18: qty 1

## 2022-12-18 MED ORDER — AMOXICILLIN-POT CLAVULANATE 875-125 MG PO TABS: 1.0000 | ORAL_TABLET | Freq: Two times a day (BID) | ORAL | Status: DC

## 2022-12-18 MED ORDER — SPIRONOLACTONE 25 MG PO TABS: 25.0000 mg | ORAL_TABLET | Freq: Every day | ORAL | 0 refills | Status: DC

## 2022-12-18 MED ORDER — FUROSEMIDE 40 MG PO TABS: 40.0000 mg | ORAL_TABLET | Freq: Every day | ORAL | 3 refills | Status: DC

## 2022-12-18 MED ORDER — AZITHROMYCIN 250 MG PO TABS
500.0000 mg | ORAL_TABLET | Freq: Every day | ORAL | Status: DC
  Administered 2022-12-18: 500 mg via ORAL
  Filled 2022-12-18: qty 2

## 2022-12-18 NOTE — Progress Notes (Signed)
PHARMACY NOTE:  ANTIMICROBIAL RENAL DOSAGE ADJUSTMENT  Current antimicrobial regimen includes a mismatch between antimicrobial dosage and estimated renal function.  As per policy approved by the Pharmacy & Therapeutics and Medical Executive Committees, the antimicrobial dosage will be adjusted accordingly.  Current antimicrobial dosage:  Augmentin 875-125 mg q12h   Renal Function:  Estimated Creatinine Clearance: 26 mL/min (A) (by C-G formula based on SCr of 1.15 mg/dL (H)).     Antimicrobial dosage has been changed to:  Augmentin 500-125 mg q12h     Thank you for allowing pharmacy to be a part of this patient's care.  Pricilla Riffle, PharmD, BCPS Clinical Pharmacist 12/18/2022 8:18 AM

## 2022-12-18 NOTE — Plan of Care (Signed)
  Problem: Health Behavior/Discharge Planning: Goal: Ability to manage health-related needs will improve Outcome: Progressing   Problem: Education: Goal: Knowledge of General Education information will improve Description: Including pain rating scale, medication(s)/side effects and non-pharmacologic comfort measures Outcome: Progressing   Problem: Health Behavior/Discharge Planning: Goal: Ability to manage health-related needs will improve Outcome: Progressing   

## 2022-12-18 NOTE — Discharge Summary (Signed)
Physician Discharge Summary  [name] JWJ:[id number] DOB: March 06, 1928 DOA: 12/15/2022  PCP: Sigmund HazelMiller, Lisa, MD  Admit date: 12/15/2022 Discharge date: 12/18/2022  Admitted From: Home Disposition:  Home  Recommendations for Outpatient Follow-up:  Follow up with PCP in 1-2 weeks Please obtain BMP/CBC in one week   Home Health:Yes Equipment/Devices:None  Discharge Condition:Stable CODE STATUS:Full Diet recommendation: Heart Healthy   Brief/Interim Summary:  89 y.o. female past medical history significant for chronic atrial fibrillation on apixaban, chronic diastolic heart failure with moderate mitral regurgitation right breast cancer, history of T12 compression fracture, history of CVA comes into the emergency room complaining of fatigue productive cough and rhinorrhea for several days prior to admission.  Her primary care doctor has been treating her for pneumonia with antibiotics for the past 3 days but started developing fevers the day prior to admission   Discharge Diagnoses:  Principal Problem:   CAP (community acquired pneumonia) Active Problems:   Chronic atrial fibrillation   Pulmonary HTN   Hyperlipidemia   Essential hypertension   Coronary artery disease due to lipid rich plaque   GERD (gastroesophageal reflux disease)   Chronic diastolic heart failure   Acute cystitis   Hyponatremia   AKI (acute kidney injury)  Community-acquired pneumonia: She relates she had a fever at home she was continue Rocephin and azithromycin she defervesced leukocytosis improved. She will continue Augmentin and azithromycin as an outpatient. SARS-CoV-2 PCR is negative strep pneumo antigen was negative. Sputum cultures and blood cultures remain negative till date. Physical therapy evaluated the patient recommended home health PT.  Acute cystitis: Urine culture was inconclusive she was started on IV Rocephin in house, she will continue Augmentin as an outpatient which should  cover.  Hypovolemic hyponatremia: Likely due to GI losses resolved with IV fluids rotation.  Acute kidney injury: Likely prerenal azotemia in the setting of antihypertensive medication use and diuretics. Her baseline creatinine is around 1 on admission 1.7 she was started on IV fluids and her creatinine improved close to baseline, she will continue to hold diuretics for 2 additional days and resume antihypertensive medication as an outpatient.  Chronic atrial fibrillation: With a chads Vascor of 9 continue apixaban diltiazem and metoprolol she is currently rate controlled.  Chronic diastolic heart failure: No changes made to her medication continue regimen.  Hyperlipidemia: Continue statins.  Essential hypertension: No changes made to her medication.  Coronary artery disease: No change made to her medication.  Discharge Instructions  Discharge Instructions     Diet - low sodium heart healthy   Complete by: As directed    Increase activity slowly   Complete by: As directed       Allergies as of 12/18/2022       Reactions   Valium [diazepam] Other (See Comments)   HYPERACTIVITY   Amiodarone Hcl [amiodarone] Other (See Comments)   A-Fib   Compazine [prochlorperazine] Other (See Comments)   HYPERACTIVITY    Other Other (See Comments)   "Kidney dye"--patient states this gave her severe shakes/chills   Thorazine [chlorpromazine] Other (See Comments)   HYPERACTIVITY    Zometa [zoledronic Acid] Other (See Comments)   PROBLEMS WITH EYES TIA   Ciprofloxacin Swelling, Other (See Comments)   "swelling" in Rt.elbow   Doxycycline Nausea Only   Zoster Vac Recomb Adjuvanted Other (See Comments)   Welt under injection site   Iodinated Contrast Media Anxiety, Other (See Comments)   Chills and anxiety        Medication List  STOP taking these medications    Centrum Silver 50+Women Tabs       TAKE these medications    acetaminophen 500 MG tablet Commonly known  as: TYLENOL Take 500-1,000 mg by mouth every 12 (twelve) hours as needed (pain).   albuterol 108 (90 Base) MCG/ACT inhaler Commonly known as: Ventolin HFA Inhale 1 puff into the lungs every 6 (six) hours as needed for wheezing or shortness of breath (Cough).   allopurinol 100 MG tablet Commonly known as: ZYLOPRIM Take 1 tablet (100 mg total) by mouth daily.   amoxicillin-clavulanate 875-125 MG tablet Commonly known as: AUGMENTIN Take 1 tablet by mouth 2 (two) times daily.   apixaban 2.5 MG Tabs tablet Commonly known as: ELIQUIS Take 1 tablet (2.5 mg total) by mouth 2 (two) times daily.   ascorbic acid 500 MG tablet Commonly known as: VITAMIN C Take 500 mg by mouth 2 (two) times daily.   atorvastatin 10 MG tablet Commonly known as: LIPITOR TAKE 1 TABLET DAILY What changed: when to take this   azithromycin 250 MG tablet Commonly known as: ZITHROMAX Take 250-500 mg by mouth See admin instructions. Take 500 mg by mouth on day one, then 250 mg once a day on days 2-5   diltiazem 60 MG tablet Commonly known as: CARDIZEM TAKE 1 TABLET THREE TIMES A DAY What changed:  when to take this additional instructions   furosemide 40 MG tablet Commonly known as: LASIX Take 1 tablet (40 mg total) by mouth daily. Start taking on: December 20, 2022 What changed: These instructions start on December 20, 2022. If you are unsure what to do until then, ask your doctor or other care provider.   metoprolol tartrate 100 MG tablet Commonly known as: LOPRESSOR TAKE 1 TABLET TWICE A DAY   omeprazole 10 MG capsule Commonly known as: PRILOSEC Take 10 mg by mouth every other day.   spironolactone 25 MG tablet Commonly known as: ALDACTONE Take 1 tablet (25 mg total) by mouth daily. Start taking on: December 20, 2022 What changed: These instructions start on December 20, 2022. If you are unsure what to do until then, ask your doctor or other care provider.   UP4 PROBIOTICS WOMENS PO Take 1 capsule by mouth  daily with breakfast.   Vitamin B Complex Caps Take 1 capsule by mouth daily.        Allergies  Allergen Reactions   Valium [Diazepam] Other (See Comments)    HYPERACTIVITY    Amiodarone Hcl [Amiodarone] Other (See Comments)    A-Fib   Compazine [Prochlorperazine] Other (See Comments)    HYPERACTIVITY    Other Other (See Comments)    "Kidney dye"--patient states this gave her severe shakes/chills   Thorazine [Chlorpromazine] Other (See Comments)    HYPERACTIVITY    Zometa [Zoledronic Acid] Other (See Comments)    PROBLEMS WITH EYES  TIA   Ciprofloxacin Swelling and Other (See Comments)    "swelling" in Rt.elbow   Doxycycline Nausea Only   Zoster Vac Recomb Adjuvanted Other (See Comments)    Welt under injection site   Iodinated Contrast Media Anxiety and Other (See Comments)    Chills and anxiety    Consultations: None   Procedures/Studies: DG Chest 2 View  Result Date: 12/15/2022 CLINICAL DATA:  Cough, dyspnea EXAM: CHEST - 2 VIEW COMPARISON:  None Available. FINDINGS: Lungs are clear. No pneumothorax or pleural effusion. Cardiac size is mildly enlarged, unchanged. Pulmonary vascularity is normal. Osseous structures are age-appropriate. Surgical clips are  seen within the left axilla and left breast. IMPRESSION: 1. No active cardiopulmonary disease. Mild cardiomegaly. Electronically Signed   By: Helyn Numbers M.D.   On: 12/15/2022 19:22   DG Chest 2 View  Result Date: 12/07/2022 CLINICAL DATA:  Follow-up pneumonia EXAM: CHEST - 2 VIEW COMPARISON:  09/03/2022 FINDINGS: Resolution of right basilar opacity. Clear lung fields. Mild cardiomegaly with aortic atherosclerosis. No pleural effusion or pneumothorax IMPRESSION: Resolution of right basilar opacity. No active cardiopulmonary disease. Mild cardiomegaly. Electronically Signed   By: Jasmine Pang M.D.   On: 12/07/2022 22:36   MM DIAG BREAST TOMO UNI LEFT  Result Date: 12/05/2022 CLINICAL DATA:  89 year old female  status post right mastectomy and malignant left lumpectomy in 2021. No current breast related symptoms. EXAM: DIGITAL DIAGNOSTIC UNILATERAL LEFT MAMMOGRAM WITH TOMOSYNTHESIS TECHNIQUE: Left digital diagnostic mammography and breast tomosynthesis was performed. COMPARISON:  Previous exam(s). ACR Breast Density Category c: The breasts are heterogeneously dense, which may obscure small masses. FINDINGS: Stable post lumpectomy changes within the central left breast. Diffuse skin and trabecular thickening is consistent with radiation related changes. Otherwise, no new or suspicious findings. The parenchymal pattern is stable. IMPRESSION: 1. No mammographic evidence of malignancy in the left breast. 2. Stable left breast posttreatment changes. RECOMMENDATION: Per protocol, as the patient is now 2 or more years status post lumpectomy, she may return to annual screening mammography in 1 year. However, given the history of breast cancer, the patient remains eligible for annual diagnostic mammography if preferred. I have discussed the findings and recommendations with the patient. If applicable, a reminder letter will be sent to the patient regarding the next appointment. BI-RADS CATEGORY  2: Benign. Electronically Signed   By: Sande Brothers M.D.   On: 12/05/2022 14:43  (Echo, Carotid, EGD, Colonoscopy, ERCP)    Subjective: No complaints  Discharge Exam: Vitals:   12/17/22 2209 12/18/22 0608  BP: (!) 144/82 136/81  Pulse: 69 69  Resp: 18 18  Temp: 98.7 F (37.1 C) 98.8 F (37.1 C)  SpO2: 97% 97%   Vitals:   12/17/22 0540 12/17/22 1355 12/17/22 2209 12/18/22 0608  BP: (!) 144/75 129/66 (!) 144/82 136/81  Pulse: 62 67 69 69  Resp: 18 14 18 18   Temp: 97.9 F (36.6 C) 98.2 F (36.8 C) 98.7 F (37.1 C) 98.8 F (37.1 C)  TempSrc: Oral Oral Oral Oral  SpO2: 97% 97% 97% 97%  Weight:      Height:        General: Pt is alert, awake, not in acute distress Cardiovascular: RRR, S1/S2 +, no rubs, no  gallops Respiratory: CTA bilaterally, no wheezing, no rhonchi Abdominal: Soft, NT, ND, bowel sounds + Extremities: no edema, no cyanosis    The results of significant diagnostics from this hospitalization (including imaging, microbiology, ancillary and laboratory) are listed below for reference.     Microbiology: Recent Results (from the past 240 hour(s))  SARS Coronavirus 2 by RT PCR (hospital order, performed in [facility] hospital lab) *cepheid single result test* Anterior Nasal Swab     Status: None   Collection Time: 12/15/22  7:16 PM   Specimen: Anterior Nasal Swab  Result Value Ref Range Status   SARS Coronavirus 2 by RT PCR NEGATIVE NEGATIVE Final    Comment: (NOTE) SARS-CoV-2 target nucleic acids are NOT DETECTED.  The SARS-CoV-2 RNA is generally detectable in upper and lower respiratory specimens during the acute phase of infection. The lowest concentration of SARS-CoV-2 viral copies this assay  can detect is 250 copies / mL. A negative result does not preclude SARS-CoV-2 infection and should not be used as the sole basis for treatment or other patient management decisions.  A negative result may occur with improper specimen collection / handling, submission of specimen other than nasopharyngeal swab, presence of viral mutation(s) within the areas targeted by this assay, and inadequate number of viral copies (<250 copies / mL). A negative result must be combined with clinical observations, patient history, and epidemiological information.  Fact Sheet for Patients:   RoadLapTop.co.za  Fact Sheet for Healthcare Providers: http://kim-miller.com/  This test is not yet approved or  cleared by the [place] FDA and has been authorized for detection and/or diagnosis of SARS-CoV-2 by FDA under an Emergency Use Authorization (EUA).  This EUA will remain in effect (meaning this test can be used) for the duration of the COVID-19  declaration under Section 564(b)(1) of the Act, 21 U.S.C. section 360bbb-3(b)(1), unless the authorization is terminated or revoked sooner.  Performed at [facility], [address] [place], [place] [address]   Urine Culture     Status: Abnormal   Collection Time: 12/15/22  8:33 PM   Specimen: Urine, Clean Catch  Result Value Ref Range Status   Specimen Description   Final    URINE, CLEAN CATCH Performed at [facility], [address], [place], [place] [address]    Special Requests   Final    NONE Performed at [facility], [address] [place], [place] [address]    Culture MULTIPLE SPECIES PRESENT, SUGGEST RECOLLECTION (A)  Final   Report Status 12/17/2022 FINAL  Final  Respiratory (~20 pathogens) panel by PCR     Status: None   Collection Time: 12/16/22  9:11 AM   Specimen: Nasopharyngeal Swab; Respiratory  Result Value Ref Range Status   Adenovirus NOT DETECTED NOT DETECTED Final   Coronavirus 229E NOT DETECTED NOT DETECTED Final    Comment: (NOTE) The Coronavirus on the Respiratory Panel, DOES NOT test for the novel  Coronavirus (2019 nCoV)    Coronavirus HKU1 NOT DETECTED NOT DETECTED Final   Coronavirus NL63 NOT DETECTED NOT DETECTED Final   Coronavirus OC43 NOT DETECTED NOT DETECTED Final   Metapneumovirus NOT DETECTED NOT DETECTED Final   Rhinovirus / Enterovirus NOT DETECTED NOT DETECTED Final   Influenza A NOT DETECTED NOT DETECTED Final   Influenza B NOT DETECTED NOT DETECTED Final   Parainfluenza Virus 1 NOT DETECTED NOT DETECTED Final   Parainfluenza Virus 2 NOT DETECTED NOT DETECTED Final   Parainfluenza Virus 3 NOT DETECTED NOT DETECTED Final   Parainfluenza Virus 4 NOT DETECTED NOT DETECTED Final   Respiratory Syncytial Virus NOT DETECTED NOT DETECTED Final   Bordetella pertussis NOT DETECTED NOT DETECTED Final   Bordetella Parapertussis NOT DETECTED NOT DETECTED Final   Chlamydophila  pneumoniae NOT DETECTED NOT DETECTED Final   Mycoplasma pneumoniae NOT DETECTED NOT DETECTED Final    Comment: Performed at [facility], [address], [place], [place] [address]     Labs: BNP (last 3 results) Recent Labs    09/03/22 0850  BNP 420.7*   Basic Metabolic Panel: Recent Labs  Lab 12/15/22 1855 12/16/22 0917 12/17/22 0405 12/18/22 0323  NA 129* 134* 135 136  K 3.9 3.7 3.8 4.5  CL 91* 97* 103 105  CO2 28 28 25 25   GLUCOSE 178* 103* 106* 85  BUN 45* 37* 37* 29*  CREATININE 1.73* 1.46* 1.52*  1.15*  CALCIUM 9.4 9.2 8.0* 8.2*   Liver Function Tests: Recent Labs  Lab 12/17/22 0405  AST 18  ALT 22  ALKPHOS 65  BILITOT 0.6  PROT 5.8*  ALBUMIN 2.6*   No results for input(s): "LIPASE", "AMYLASE" in the last 168 hours. No results for input(s): "AMMONIA" in the last 168 hours. CBC: Recent Labs  Lab 12/15/22 1855 12/17/22 0405  WBC 13.2* 10.9*  HGB 11.3* 10.6*  HCT 32.6* 32.1*  MCV 92.1 95.3  PLT 167 178   Cardiac Enzymes: No results for input(s): "CKTOTAL", "CKMB", "CKMBINDEX", "TROPONINI" in the last 168 hours. BNP: Invalid input(s): "POCBNP" CBG: Recent Labs  Lab 12/16/22 2204 12/17/22 0758 12/17/22 1202 12/17/22 2206 12/18/22 0800  GLUCAP 146* 81 148* 164* 93   D-Dimer No results for input(s): "DDIMER" in the last 72 hours. Hgb A1c Recent Labs    12/15/22 2105  HGBA1C 6.1*   Lipid Profile No results for input(s): "CHOL", "HDL", "LDLCALC", "TRIG", "CHOLHDL", "LDLDIRECT" in the last 72 hours. Thyroid function studies No results for input(s): "TSH", "T4TOTAL", "T3FREE", "THYROIDAB" in the last 72 hours.  Invalid input(s): "FREET3" Anemia work up No results for input(s): "VITAMINB12", "FOLATE", "FERRITIN", "TIBC", "IRON", "RETICCTPCT" in the last 72 hours. Urinalysis    Component Value Date/Time   COLORURINE YELLOW 12/15/2022 2002   APPEARANCEUR CLOUDY (A) 12/15/2022 2002   LABSPEC 1.009 12/15/2022 2002   PHURINE 5.5  12/15/2022 2002   GLUCOSEU NEGATIVE 12/15/2022 2002   HGBUR MODERATE (A) 12/15/2022 2002   BILIRUBINUR NEGATIVE 12/15/2022 2002   BILIRUBINUR n 04/14/2020 1705   KETONESUR NEGATIVE 12/15/2022 2002   PROTEINUR 30 (A) 12/15/2022 2002   UROBILINOGEN 0.2 04/14/2020 1705   NITRITE NEGATIVE 12/15/2022 2002   LEUKOCYTESUR LARGE (A) 12/15/2022 2002   Sepsis Labs Recent Labs  Lab 12/15/22 1855 12/17/22 0405  WBC 13.2* 10.9*   Microbiology Recent Results (from the past 240 hour(s))  SARS Coronavirus 2 by RT PCR (hospital order, performed in [facility] hospital lab) *cepheid single result test* Anterior Nasal Swab     Status: None   Collection Time: 12/15/22  7:16 PM   Specimen: Anterior Nasal Swab  Result Value Ref Range Status   SARS Coronavirus 2 by RT PCR NEGATIVE NEGATIVE Final    Comment: (NOTE) SARS-CoV-2 target nucleic acids are NOT DETECTED.  The SARS-CoV-2 RNA is generally detectable in upper and lower respiratory specimens during the acute phase of infection. The lowest concentration of SARS-CoV-2 viral copies this assay can detect is 250 copies / mL. A negative result does not preclude SARS-CoV-2 infection and should not be used as the sole basis for treatment or other patient management decisions.  A negative result may occur with improper specimen collection / handling, submission of specimen other than nasopharyngeal swab, presence of viral mutation(s) within the areas targeted by this assay, and inadequate number of viral copies (<250 copies / mL). A negative result must be combined with clinical observations, patient history, and epidemiological information.  Fact Sheet for Patients:   RoadLapTop.co.za  Fact Sheet for Healthcare Providers: http://kim-miller.com/  This test is not yet approved or  cleared by the [place] FDA and has been authorized for detection and/or diagnosis of SARS-CoV-2 by FDA under an  Emergency Use Authorization (EUA).  This EUA will remain in effect (meaning this test can be used) for the duration of the COVID-19 declaration under Section 564(b)(1) of the Act, 21 U.S.C. section 360bbb-3(b)(1), unless the authorization is terminated or revoked sooner.  Performed at [facility], [address] [place], [place] [address]   Urine Culture     Status: Abnormal   Collection Time: 12/15/22  8:33 PM   Specimen: Urine, Clean Catch  Result Value Ref Range Status   Specimen Description   Final    URINE, CLEAN CATCH Performed at [facility], [address], [place], [place] [address]    Special Requests   Final    NONE Performed at [facility], [address] [place], [place] [address]    Culture MULTIPLE SPECIES PRESENT, SUGGEST RECOLLECTION (A)  Final   Report Status 12/17/2022 FINAL  Final  Respiratory (~20 pathogens) panel by PCR     Status: None   Collection Time: 12/16/22  9:11 AM   Specimen: Nasopharyngeal Swab; Respiratory  Result Value Ref Range Status   Adenovirus NOT DETECTED NOT DETECTED Final   Coronavirus 229E NOT DETECTED NOT DETECTED Final    Comment: (NOTE) The Coronavirus on the Respiratory Panel, DOES NOT test for the novel  Coronavirus (2019 nCoV)    Coronavirus HKU1 NOT DETECTED NOT DETECTED Final   Coronavirus NL63 NOT DETECTED NOT DETECTED Final   Coronavirus OC43 NOT DETECTED NOT DETECTED Final   Metapneumovirus NOT DETECTED NOT DETECTED Final   Rhinovirus / Enterovirus NOT DETECTED NOT DETECTED Final   Influenza A NOT DETECTED NOT DETECTED Final   Influenza B NOT DETECTED NOT DETECTED Final   Parainfluenza Virus 1 NOT DETECTED NOT DETECTED Final   Parainfluenza Virus 2 NOT DETECTED NOT DETECTED Final   Parainfluenza Virus 3 NOT DETECTED NOT DETECTED Final   Parainfluenza Virus 4 NOT DETECTED NOT DETECTED Final   Respiratory Syncytial Virus NOT DETECTED NOT DETECTED Final    Bordetella pertussis NOT DETECTED NOT DETECTED Final   Bordetella Parapertussis NOT DETECTED NOT DETECTED Final   Chlamydophila pneumoniae NOT DETECTED NOT DETECTED Final   Mycoplasma pneumoniae NOT DETECTED NOT DETECTED Final    Comment: Performed at [facility], [address], [place], [place] [address]    SIGNED:   Marinda Elk, MD  [facility] 12/18/2022, 8:21 AM Pager   If 7PM-7AM, please contact night-coverage [email or web address] Password [email or web address]

## 2022-12-18 NOTE — Progress Notes (Signed)
The patient is alert and oriented and has been seen by her physician. The orders for discharge were written. IV has been removed. Went over discharge instructions with patient and family. She is being discharged via wheelchair with all of her belongings.  

## 2022-12-18 NOTE — TOC Initial Note (Signed)
Transition of Care [facility]) - Initial/Assessment Note    Patient Details  Name: Adrienne [name] MRN: [id number] Date of Birth: [birth year]  [facility]) CM/SW Contact:    Adrian Prows, RN Phone Number: 12/18/2022, 11:00 AM  Clinical Narrative:                 [facility] consult for HHPT; spoke w/ pt and husband in room; they agree to service; their preference is [facility]; contacted [place] at agency and he says agency can provide services; pt/husband notified; agency contact info also placed in follow up provider section of d/c instructions; no TOC needs.  Expected Discharge Plan: Home w Home Health Services Barriers to Discharge: No Barriers Identified   Patient Goals and CMS Choice Patient states their goals for this hospitalization and ongoing recovery are:: home          Expected Discharge Plan and Services   Discharge Planning Services: CM Consult Post Acute Care Choice: Home Health Living arrangements for the past 2 months: Single Family Home Expected Discharge Date: 12/18/22                         HH Arranged: PT HH Agency: [facility] Date [facility] Agency Contacted: 12/18/22 Time HH Agency Contacted: 1059 Representative spoke with at [facility] Agency: Kandee Keen  Prior Living Arrangements/Services Living arrangements for the past 2 months: Single Family Home Lives with:: Spouse Patient language and need for interpreter reviewed:: Yes Do you feel safe going back to the place where you live?: Yes      Need for Family Participation in Patient Care: Yes (Comment) Care giver support system in place?: Yes (comment) Current home services: DME (shower chair, walker) Criminal Activity/Legal Involvement Pertinent to Current Situation/Hospitalization: No - Comment as needed  Activities of Daily Living Home Assistive Devices/Equipment: Walker (specify type) ADL Screening (condition at time of admission) Patient's cognitive ability adequate to safely complete  daily activities?: Yes Is the patient deaf or have difficulty hearing?: No Does the patient have difficulty seeing, even when wearing glasses/contacts?: No Does the patient have difficulty concentrating, remembering, or making decisions?: No Patient able to express need for assistance with ADLs?: Yes Does the patient have difficulty dressing or bathing?: No Independently performs ADLs?: Yes (appropriate for developmental age) Does the patient have difficulty walking or climbing stairs?: No Weakness of Legs: None Weakness of Arms/Hands: None  Permission Sought/Granted Permission sought to share information with : Case Manager Permission granted to share information with : Yes, Verbal Permission Granted  Share Information with NAME: Burnard Bunting, RN, CM     Permission granted to share info w Relationship: Delaphine Dalmau (spouse) [phone]     Emotional Assessment Appearance:: Appears stated age Attitude/Demeanor/Rapport: Gracious Affect (typically observed): Accepting Orientation: : Oriented to Self, Oriented to Place, Oriented to  Time, Oriented to Situation Alcohol / Substance Use: Not Applicable Psych Involvement: No (comment)  Admission diagnosis:  Acute cystitis [N30.00] Chills [R68.83] Hyponatremia [E87.1] Acute cystitis without hematuria [N30.00] AKI (acute kidney injury) [N17.9] Patient Active Problem List   Diagnosis Date Noted   Hyponatremia 12/16/2022   AKI (acute kidney injury) 12/16/2022   Acute cystitis 12/15/2022   Acute respiratory failure with hypoxia 09/03/2022   CAP (community acquired pneumonia) 09/03/2022   Complicated urinary tract infection 09/03/2022   Heart failure with preserved ejection fraction 09/03/2022   Leukocytosis 09/03/2022   Open knee wound 10/05/2021   Chronic diastolic heart failure 08/18/2021  Volume overload 08/17/2021   Palliative care by specialist    Goals of care, counseling/discussion    DNR (do not resuscitate)  discussion    Vaginal bleeding 08/08/2020   Uterine prolapse 08/08/2020   Vaginal erosion secondary to pessary use 08/08/2020   Acute urinary retention 08/08/2020   Chronic respiratory failure 08/08/2020   Multiple pulmonary nodules 05/24/2020   Chronic cough 05/24/2020   Malignant neoplasm of upper-inner quadrant of left breast in female, estrogen receptor negative 12/20/2019   Malignant neoplasm of overlapping sites of right breast in female, estrogen receptor positive 08/14/2019   Osteopenia 08/14/2019   Diplopia 01/29/2016   GERD (gastroesophageal reflux disease) 01/29/2016   Cerebrovascular accident (CVA) due to embolism of left middle cerebral artery 01/29/2016   Bleeding gastrointestinal    Gait disturbance, post-stroke 01/27/2016   Fall    Secondary hypertension, unspecified    Cerebral infarction due to thrombosis of left middle cerebral artery (HCC) s/p mechanical thrombectomy    Malnutrition of moderate degree 01/09/2016   GI bleed 01/06/2016   Chronic atrial fibrillation 06/10/2015   Chronic anticoagulation 06/10/2015   Hyperlipidemia 06/10/2015   Essential hypertension 06/10/2015   History of stroke 06/10/2015   Coronary artery disease due to lipid rich plaque 06/10/2015   Elevated uric acid in blood    Arthritis of ankle    Pulmonary HTN    PCP:  Sigmund Hazel, MD Pharmacy:   [facility] [phone] - [place], [place] - [address] AT [facility] [address] [place] [address] Phone: [phone] Fax: [phone]     Social Determinants of Health (SDOH) Social History: SDOH Screenings   Food Insecurity: No Food Insecurity (12/16/2022)  Housing: Low Risk  (12/18/2022)  Transportation Needs: No Transportation Needs (12/18/2022)  Utilities: Not At Risk (12/18/2022)  Tobacco Use: Medium Risk (12/15/2022)   SDOH Interventions: Food Insecurity Interventions: Intervention Not Indicated Housing Interventions: Inpatient  TOC Transportation Interventions: Inpatient TOC Utilities Interventions: Inpatient TOC   Readmission Risk Interventions     No data to display

## 2022-12-21 DIAGNOSIS — N3000 Acute cystitis without hematuria: Secondary | ICD-10-CM | POA: Diagnosis not present

## 2022-12-21 DIAGNOSIS — N3020 Other chronic cystitis without hematuria: Secondary | ICD-10-CM | POA: Diagnosis not present

## 2022-12-21 DIAGNOSIS — R338 Other retention of urine: Secondary | ICD-10-CM | POA: Diagnosis not present

## 2022-12-27 DIAGNOSIS — K219 Gastro-esophageal reflux disease without esophagitis: Secondary | ICD-10-CM | POA: Diagnosis not present

## 2022-12-27 DIAGNOSIS — N183 Chronic kidney disease, stage 3 unspecified: Secondary | ICD-10-CM | POA: Diagnosis not present

## 2022-12-27 DIAGNOSIS — E7800 Pure hypercholesterolemia, unspecified: Secondary | ICD-10-CM | POA: Diagnosis not present

## 2022-12-27 DIAGNOSIS — I4891 Unspecified atrial fibrillation: Secondary | ICD-10-CM | POA: Diagnosis not present

## 2022-12-29 DIAGNOSIS — N179 Acute kidney failure, unspecified: Secondary | ICD-10-CM | POA: Diagnosis not present

## 2022-12-29 DIAGNOSIS — N390 Urinary tract infection, site not specified: Secondary | ICD-10-CM | POA: Diagnosis not present

## 2022-12-29 DIAGNOSIS — R5383 Other fatigue: Secondary | ICD-10-CM | POA: Diagnosis not present

## 2022-12-29 DIAGNOSIS — Z6826 Body mass index (BMI) 26.0-26.9, adult: Secondary | ICD-10-CM | POA: Diagnosis not present

## 2022-12-29 DIAGNOSIS — D509 Iron deficiency anemia, unspecified: Secondary | ICD-10-CM | POA: Diagnosis not present

## 2022-12-29 LAB — LAB REPORT - SCANNED: EGFR: 33

## 2023-01-03 ENCOUNTER — Encounter: Payer: Self-pay | Admitting: Physician Assistant

## 2023-01-03 ENCOUNTER — Ambulatory Visit: Payer: Medicare Other | Attending: Physician Assistant | Admitting: Physician Assistant

## 2023-01-03 VITALS — BP 130/70 | HR 64 | Ht 61.0 in | Wt 127.2 lb

## 2023-01-03 DIAGNOSIS — I10 Essential (primary) hypertension: Secondary | ICD-10-CM

## 2023-01-03 DIAGNOSIS — I2583 Coronary atherosclerosis due to lipid rich plaque: Secondary | ICD-10-CM | POA: Diagnosis not present

## 2023-01-03 DIAGNOSIS — I2510 Atherosclerotic heart disease of native coronary artery without angina pectoris: Secondary | ICD-10-CM

## 2023-01-03 DIAGNOSIS — I4821 Permanent atrial fibrillation: Secondary | ICD-10-CM

## 2023-01-03 DIAGNOSIS — J189 Pneumonia, unspecified organism: Secondary | ICD-10-CM | POA: Diagnosis not present

## 2023-01-03 DIAGNOSIS — I5032 Chronic diastolic (congestive) heart failure: Secondary | ICD-10-CM

## 2023-01-03 NOTE — Patient Instructions (Signed)
Medication Instructions:  Your physician recommends that you continue on your current medications as directed. Please refer to the Current Medication list given to you today.  *If you need a refill on your cardiac medications before your next appointment, please call your pharmacy*   Lab Work: None ordered  If you have labs (blood work) drawn today and your tests are completely normal, you will receive your results only by: MyChart Message (if you have MyChart) OR A paper copy in the mail If you have any lab test that is abnormal or we need to change your treatment, we will call you to review the results.   Testing/Procedures: None ordered   Follow-Up: At [facility], you and your health needs are our priority.  As part of our continuing mission to provide you with exceptional heart care, we have created designated Provider Care Teams.  These Care Teams include your primary Cardiologist (physician) and Advanced Practice Providers (APPs -  Physician Assistants and Nurse Practitioners) who all work together to provide you with the care you need, when you need it.  We recommend signing up for the patient portal called "MyChart".  Sign up information is provided on this After Visit Summary.  MyChart is used to connect with patients for Virtual Visits (Telemedicine).  Patients are able to view lab/test results, encounter notes, upcoming appointments, etc.  Non-urgent messages can be sent to your provider as well.   To learn more about what you can do with MyChart, go to [email or web address].    Your next appointment:   6 month(s)  Provider:   Mark Skains, MD     Other Instructions  

## 2023-01-03 NOTE — Assessment & Plan Note (Signed)
Hx of nonobstructive coronary artery disease. She is not having chest pain to suggest angina. She is not on ASA as she is on Eliquis. Continue Lipitor 10 mg once daily.

## 2023-01-03 NOTE — Assessment & Plan Note (Signed)
BP well controlled. Continue Diltiazem 60 mg three times a day, Metoprolol tartrate 100 mg twice daily, Spironolactone 25 mg once daily.

## 2023-01-03 NOTE — Progress Notes (Signed)
Cardiology Office Note:    Date:  01/03/2023  ID:  Adrienne [name], DOB 1928/04/14, MRN [id number] PCP: Sigmund Hazel, MD  [facility] Providers Cardiologist:  Donato Schultz, MD       Patient Profile:   Coronary artery Ca2+ on CT  Notes indicate LHC in 10/2011 in [place]: nonobstructive CAD Permanent atrial fibrillation  Wt < 60 kg, Age > 80: Apixaban 2.5 mg twice daily  (HFpEF) heart failure with preserved ejection fraction  Left Bundle Branch Block  Mod mitral regurgitation  Atrial functional MR TTE 08/18/21: EF 55-60, mod MR  TTE 12/09/21: EF 55-60, mod MR TTE 09/04/22: EF 50-55, global HK, mod LVH, mildly reduced RVSF, mild LAE, mild-mod RAE, degen MV w mod to severe MR, AV sclerosis  Carotid US 12/06/17: Bilat ICA 1-39  Hx of TIA S/p L MCA CVA s/p thrombectomy (while off A/c due to GI bleeding) Pulmonary hypertension  Hypertension  Hyperlipidemia  Chronic kidney disease  GERD Hx of uterine prolapse, chronic indwelling Foley cath R Breast CA    History of Present Illness:   Adrienne [name] is a 89 y.o. female who returns for post hospital f/u. She was admitted in Dec 2023 for pneumonia, acute CHF, UTI. She had a f/u with Dr. Anne Fu 10/07/22 with plans for 3 mos f/u with me. She was admitted again 4/4-4/7 with community acquired pneumonia. She was noted to have hypovolemic hyponatremia, likely due to GI losses. Na improved with IV fluid rotation. Her diuretics were held due to AKI (SCr up to 1.7). Creatinine improved with IVFs. She is here today with her daughter and husband. She has been feeling better since her hospitalization. She has not had chest pain, syncope, orthopnea, edema. She does get short of breath with certain activities. This is overall stable.   Review of Systems  Gastrointestinal:  Negative for hematochezia and melena.  Genitourinary:  Negative for hematuria.  See HPI    Studies Reviewed:    EKG:  AFib, HR 64, Left Bundle Branch Block    Risk Assessment/Calculations:    CHA2DS2-VASc Score = 8   This indicates a 10.8% annual risk of stroke. The patient's score is based upon: CHF History: 1 HTN History: 1 Diabetes History: 0 Stroke History: 2 Vascular Disease History: 1 Age Score: 2 Gender Score: 1            Physical Exam:   VS:  BP 130/70   Pulse 64   Ht  (1.549 m)   Wt 127 lb 3.2 oz (57.7 kg)   LMP 09/12/1973 (Approximate)   SpO2 95%   BMI 24.03 kg/m    Wt Readings from Last 3 Encounters:  01/03/23 127 lb 3.2 oz (57.7 kg)  12/15/22 133 lb (60.3 kg)  10/07/22 134 lb (60.8 kg)    Constitutional:      Appearance: Not in distress.  Pulmonary:     Breath sounds: Normal breath sounds. No wheezing. No rales.  Cardiovascular:     Normal rate. Irregularly irregular rhythm.     Murmurs: There is no murmur.  Edema:    Peripheral edema absent.  Abdominal:     Palpations: Abdomen is soft.       ASSESSMENT AND PLAN:   Heart failure with preserved ejection fraction EF 50-55 by echocardiogram in 08/2022. NYHA IIb-III. Volume status stable. She had labs with primary care 4/18. I will request those labs for review. She is not a candidate for SGLT2i with chronic indwelling catheter.  Continue Lasix 40 mg once daily, Spironolactone 25 mg once daily. F/u 6 mos.   Permanent atrial fibrillation HR is controlled. Wt is < 60 kg, age is > 80. Continue Eliquis 2.5 mg twice daily. Continue Metoprolol tartrate 100 mg twice daily, Diltiazem 60 mg three times a day. F/u 6 mos.  Essential hypertension BP well controlled. Continue Diltiazem 60 mg three times a day, Metoprolol tartrate 100 mg twice daily, Spironolactone 25 mg once daily.  Coronary artery disease due to lipid rich plaque Hx of nonobstructive coronary artery disease. She is not having chest pain to suggest angina. She is not on ASA as she is on Eliquis. Continue Lipitor 10 mg once daily.   CAP (community acquired pneumonia) S/p recent admission for  ?CAP and cystitis. Her CXR in early April was clear. Lung exam is clear today. She is feeling better. Pneumonia seems to have resolved.         Dispo:  Return in about 6 months (around 07/05/2023) for Routine Follow Up w/ Dr. Anne Fu.  Signed, Tereso Newcomer, PA-C

## 2023-01-03 NOTE — Assessment & Plan Note (Signed)
S/p recent admission for ?CAP and cystitis. Her CXR in early April was clear. Lung exam is clear today. She is feeling better. Pneumonia seems to have resolved.

## 2023-01-03 NOTE — Assessment & Plan Note (Signed)
EF 50-55 by echocardiogram in 08/2022. NYHA IIb-III. Volume status stable. She had labs with primary care 4/18. I will request those labs for review. She is not a candidate for SGLT2i with chronic indwelling catheter. Continue Lasix 40 mg once daily, Spironolactone 25 mg once daily. F/u 6 mos.

## 2023-01-03 NOTE — Assessment & Plan Note (Signed)
HR is controlled. Wt is < 60 kg, age is > 80. Continue Eliquis 2.5 mg twice daily. Continue Metoprolol tartrate 100 mg twice daily, Diltiazem 60 mg three times a day. F/u 6 mos.

## 2023-01-17 DIAGNOSIS — N3020 Other chronic cystitis without hematuria: Secondary | ICD-10-CM | POA: Diagnosis not present

## 2023-01-20 DIAGNOSIS — N3000 Acute cystitis without hematuria: Secondary | ICD-10-CM | POA: Diagnosis not present

## 2023-01-20 DIAGNOSIS — R509 Fever, unspecified: Secondary | ICD-10-CM | POA: Diagnosis not present

## 2023-01-20 DIAGNOSIS — R052 Subacute cough: Secondary | ICD-10-CM | POA: Diagnosis not present

## 2023-01-20 DIAGNOSIS — Z6826 Body mass index (BMI) 26.0-26.9, adult: Secondary | ICD-10-CM | POA: Diagnosis not present

## 2023-01-26 DIAGNOSIS — E785 Hyperlipidemia, unspecified: Secondary | ICD-10-CM | POA: Diagnosis not present

## 2023-01-26 DIAGNOSIS — Z9181 History of falling: Secondary | ICD-10-CM | POA: Diagnosis not present

## 2023-01-26 DIAGNOSIS — N189 Chronic kidney disease, unspecified: Secondary | ICD-10-CM | POA: Diagnosis not present

## 2023-01-26 DIAGNOSIS — Z87440 Personal history of urinary (tract) infections: Secondary | ICD-10-CM | POA: Diagnosis not present

## 2023-01-26 DIAGNOSIS — Z7951 Long term (current) use of inhaled steroids: Secondary | ICD-10-CM | POA: Diagnosis not present

## 2023-01-26 DIAGNOSIS — K219 Gastro-esophageal reflux disease without esophagitis: Secondary | ICD-10-CM | POA: Diagnosis not present

## 2023-01-26 DIAGNOSIS — I130 Hypertensive heart and chronic kidney disease with heart failure and stage 1 through stage 4 chronic kidney disease, or unspecified chronic kidney disease: Secondary | ICD-10-CM | POA: Diagnosis not present

## 2023-01-26 DIAGNOSIS — I5032 Chronic diastolic (congestive) heart failure: Secondary | ICD-10-CM | POA: Diagnosis not present

## 2023-01-26 DIAGNOSIS — Z791 Long term (current) use of non-steroidal anti-inflammatories (NSAID): Secondary | ICD-10-CM | POA: Diagnosis not present

## 2023-01-26 DIAGNOSIS — Z8701 Personal history of pneumonia (recurrent): Secondary | ICD-10-CM | POA: Diagnosis not present

## 2023-01-26 DIAGNOSIS — I080 Rheumatic disorders of both mitral and aortic valves: Secondary | ICD-10-CM | POA: Diagnosis not present

## 2023-01-26 DIAGNOSIS — C50911 Malignant neoplasm of unspecified site of right female breast: Secondary | ICD-10-CM | POA: Diagnosis not present

## 2023-01-26 DIAGNOSIS — Z7901 Long term (current) use of anticoagulants: Secondary | ICD-10-CM | POA: Diagnosis not present

## 2023-01-26 DIAGNOSIS — I4821 Permanent atrial fibrillation: Secondary | ICD-10-CM | POA: Diagnosis not present

## 2023-01-26 DIAGNOSIS — I447 Left bundle-branch block, unspecified: Secondary | ICD-10-CM | POA: Diagnosis not present

## 2023-01-26 DIAGNOSIS — Z8673 Personal history of transient ischemic attack (TIA), and cerebral infarction without residual deficits: Secondary | ICD-10-CM | POA: Diagnosis not present

## 2023-01-26 DIAGNOSIS — I272 Pulmonary hypertension, unspecified: Secondary | ICD-10-CM | POA: Diagnosis not present

## 2023-01-26 DIAGNOSIS — I2583 Coronary atherosclerosis due to lipid rich plaque: Secondary | ICD-10-CM | POA: Diagnosis not present

## 2023-01-26 DIAGNOSIS — N814 Uterovaginal prolapse, unspecified: Secondary | ICD-10-CM | POA: Diagnosis not present

## 2023-01-27 DIAGNOSIS — N183 Chronic kidney disease, stage 3 unspecified: Secondary | ICD-10-CM | POA: Diagnosis not present

## 2023-01-27 DIAGNOSIS — D72829 Elevated white blood cell count, unspecified: Secondary | ICD-10-CM | POA: Diagnosis not present

## 2023-01-30 ENCOUNTER — Telehealth: Payer: Self-pay | Admitting: Cardiology

## 2023-01-30 NOTE — Telephone Encounter (Signed)
Received faxed note and labs from Sigmund Hazel, MD for Dr Anne Fu to review. 01/20/2023 lab demonstrated  BUN 41,  Crea 1.57  NA+ 132  Last lab completed here 12/18/22 BUN 29 Crea 1.15 NA+ 136.  Medication list indicates she take Furosemide 40 mg daily and spironolactone 25 mg daily.  Will send to Dr Anne Fu for review.

## 2023-01-30 NOTE — Telephone Encounter (Signed)
Pt's PCP calling to make provider aware of labs results and office visit notes that were faxed. PCP would like to know if provider wants to make adjustments to pt's diuretics due to results. Please advise

## 2023-01-31 DIAGNOSIS — K5900 Constipation, unspecified: Secondary | ICD-10-CM | POA: Diagnosis not present

## 2023-01-31 DIAGNOSIS — Z6826 Body mass index (BMI) 26.0-26.9, adult: Secondary | ICD-10-CM | POA: Diagnosis not present

## 2023-01-31 MED ORDER — FUROSEMIDE 20 MG PO TABS: 20.0000 mg | ORAL_TABLET | Freq: Every day | ORAL | 3 refills | Status: DC

## 2023-01-31 NOTE — Telephone Encounter (Signed)
Spoke with pt and reviewed Dr Anne Fu order to decrease Furosemide to 20 mg a day.  She states understanding and requests a new RX be sent into her pharmacy.  RX for 20 mg one tab po daily #90 X 3 has been into [facility] as requested.

## 2023-02-01 DIAGNOSIS — C50911 Malignant neoplasm of unspecified site of right female breast: Secondary | ICD-10-CM | POA: Diagnosis not present

## 2023-02-01 DIAGNOSIS — I447 Left bundle-branch block, unspecified: Secondary | ICD-10-CM | POA: Diagnosis not present

## 2023-02-01 DIAGNOSIS — I4821 Permanent atrial fibrillation: Secondary | ICD-10-CM | POA: Diagnosis not present

## 2023-02-01 DIAGNOSIS — I130 Hypertensive heart and chronic kidney disease with heart failure and stage 1 through stage 4 chronic kidney disease, or unspecified chronic kidney disease: Secondary | ICD-10-CM | POA: Diagnosis not present

## 2023-02-01 DIAGNOSIS — I5032 Chronic diastolic (congestive) heart failure: Secondary | ICD-10-CM | POA: Diagnosis not present

## 2023-02-01 DIAGNOSIS — N189 Chronic kidney disease, unspecified: Secondary | ICD-10-CM | POA: Diagnosis not present

## 2023-02-03 DIAGNOSIS — I130 Hypertensive heart and chronic kidney disease with heart failure and stage 1 through stage 4 chronic kidney disease, or unspecified chronic kidney disease: Secondary | ICD-10-CM | POA: Diagnosis not present

## 2023-02-03 DIAGNOSIS — I4821 Permanent atrial fibrillation: Secondary | ICD-10-CM | POA: Diagnosis not present

## 2023-02-03 DIAGNOSIS — N189 Chronic kidney disease, unspecified: Secondary | ICD-10-CM | POA: Diagnosis not present

## 2023-02-03 DIAGNOSIS — I447 Left bundle-branch block, unspecified: Secondary | ICD-10-CM | POA: Diagnosis not present

## 2023-02-03 DIAGNOSIS — C50911 Malignant neoplasm of unspecified site of right female breast: Secondary | ICD-10-CM | POA: Diagnosis not present

## 2023-02-03 DIAGNOSIS — I5032 Chronic diastolic (congestive) heart failure: Secondary | ICD-10-CM | POA: Diagnosis not present

## 2023-02-04 ENCOUNTER — Other Ambulatory Visit: Payer: Self-pay

## 2023-02-04 ENCOUNTER — Inpatient Hospital Stay (HOSPITAL_BASED_OUTPATIENT_CLINIC_OR_DEPARTMENT_OTHER)
Admission: EM | Admit: 2023-02-04 | Discharge: 2023-02-08 | DRG: 699 | Disposition: A | Discharge status: Home Health | Payer: Medicare Other | Attending: Internal Medicine | Admitting: Internal Medicine

## 2023-02-04 ENCOUNTER — Emergency Department (HOSPITAL_BASED_OUTPATIENT_CLINIC_OR_DEPARTMENT_OTHER): Payer: Medicare Other

## 2023-02-04 ENCOUNTER — Encounter (HOSPITAL_BASED_OUTPATIENT_CLINIC_OR_DEPARTMENT_OTHER): Payer: Self-pay | Admitting: Emergency Medicine

## 2023-02-04 DIAGNOSIS — Z923 Personal history of irradiation: Secondary | ICD-10-CM

## 2023-02-04 DIAGNOSIS — I4821 Permanent atrial fibrillation: Secondary | ICD-10-CM | POA: Diagnosis not present

## 2023-02-04 DIAGNOSIS — Z96653 Presence of artificial knee joint, bilateral: Secondary | ICD-10-CM | POA: Diagnosis present

## 2023-02-04 DIAGNOSIS — N3000 Acute cystitis without hematuria: Principal | ICD-10-CM

## 2023-02-04 DIAGNOSIS — I700 Atherosclerosis of aorta: Secondary | ICD-10-CM | POA: Diagnosis not present

## 2023-02-04 DIAGNOSIS — Z1611 Resistance to penicillins: Secondary | ICD-10-CM | POA: Diagnosis present

## 2023-02-04 DIAGNOSIS — T83511D Infection and inflammatory reaction due to indwelling urethral catheter, subsequent encounter: Secondary | ICD-10-CM | POA: Diagnosis not present

## 2023-02-04 DIAGNOSIS — Z881 Allergy status to other antibiotic agents status: Secondary | ICD-10-CM

## 2023-02-04 DIAGNOSIS — R059 Cough, unspecified: Secondary | ICD-10-CM | POA: Diagnosis present

## 2023-02-04 DIAGNOSIS — Z9011 Acquired absence of right breast and nipple: Secondary | ICD-10-CM

## 2023-02-04 DIAGNOSIS — B9620 Unspecified Escherichia coli [E. coli] as the cause of diseases classified elsewhere: Secondary | ICD-10-CM | POA: Diagnosis present

## 2023-02-04 DIAGNOSIS — Y846 Urinary catheterization as the cause of abnormal reaction of the patient, or of later complication, without mention of misadventure at the time of the procedure: Secondary | ICD-10-CM | POA: Diagnosis present

## 2023-02-04 DIAGNOSIS — Z8673 Personal history of transient ischemic attack (TIA), and cerebral infarction without residual deficits: Secondary | ICD-10-CM

## 2023-02-04 DIAGNOSIS — I340 Nonrheumatic mitral (valve) insufficiency: Secondary | ICD-10-CM | POA: Diagnosis present

## 2023-02-04 DIAGNOSIS — Z885 Allergy status to narcotic agent status: Secondary | ICD-10-CM

## 2023-02-04 DIAGNOSIS — Z79899 Other long term (current) drug therapy: Secondary | ICD-10-CM | POA: Diagnosis not present

## 2023-02-04 DIAGNOSIS — I130 Hypertensive heart and chronic kidney disease with heart failure and stage 1 through stage 4 chronic kidney disease, or unspecified chronic kidney disease: Secondary | ICD-10-CM | POA: Diagnosis present

## 2023-02-04 DIAGNOSIS — N814 Uterovaginal prolapse, unspecified: Secondary | ICD-10-CM | POA: Diagnosis present

## 2023-02-04 DIAGNOSIS — I2510 Atherosclerotic heart disease of native coronary artery without angina pectoris: Secondary | ICD-10-CM | POA: Diagnosis present

## 2023-02-04 DIAGNOSIS — I5032 Chronic diastolic (congestive) heart failure: Secondary | ICD-10-CM

## 2023-02-04 DIAGNOSIS — N1832 Chronic kidney disease, stage 3b: Secondary | ICD-10-CM | POA: Diagnosis present

## 2023-02-04 DIAGNOSIS — Z7901 Long term (current) use of anticoagulants: Secondary | ICD-10-CM

## 2023-02-04 DIAGNOSIS — Z87440 Personal history of urinary (tract) infections: Secondary | ICD-10-CM

## 2023-02-04 DIAGNOSIS — E860 Dehydration: Secondary | ICD-10-CM | POA: Diagnosis present

## 2023-02-04 DIAGNOSIS — E785 Hyperlipidemia, unspecified: Secondary | ICD-10-CM | POA: Diagnosis present

## 2023-02-04 DIAGNOSIS — M109 Gout, unspecified: Secondary | ICD-10-CM | POA: Diagnosis present

## 2023-02-04 DIAGNOSIS — I10 Essential (primary) hypertension: Secondary | ICD-10-CM | POA: Diagnosis present

## 2023-02-04 DIAGNOSIS — N390 Urinary tract infection, site not specified: Secondary | ICD-10-CM | POA: Diagnosis not present

## 2023-02-04 DIAGNOSIS — T83518A Infection and inflammatory reaction due to other urinary catheter, initial encounter: Secondary | ICD-10-CM | POA: Diagnosis not present

## 2023-02-04 DIAGNOSIS — Z888 Allergy status to other drugs, medicaments and biological substances status: Secondary | ICD-10-CM

## 2023-02-04 DIAGNOSIS — R052 Subacute cough: Secondary | ICD-10-CM

## 2023-02-04 DIAGNOSIS — K219 Gastro-esophageal reflux disease without esophagitis: Secondary | ICD-10-CM | POA: Diagnosis present

## 2023-02-04 DIAGNOSIS — T83511A Infection and inflammatory reaction due to indwelling urethral catheter, initial encounter: Secondary | ICD-10-CM | POA: Diagnosis not present

## 2023-02-04 DIAGNOSIS — Z1152 Encounter for screening for COVID-19: Secondary | ICD-10-CM

## 2023-02-04 DIAGNOSIS — Z87891 Personal history of nicotine dependence: Secondary | ICD-10-CM | POA: Diagnosis not present

## 2023-02-04 DIAGNOSIS — Z803 Family history of malignant neoplasm of breast: Secondary | ICD-10-CM

## 2023-02-04 DIAGNOSIS — Z9049 Acquired absence of other specified parts of digestive tract: Secondary | ICD-10-CM

## 2023-02-04 DIAGNOSIS — Z9841 Cataract extraction status, right eye: Secondary | ICD-10-CM

## 2023-02-04 DIAGNOSIS — Z853 Personal history of malignant neoplasm of breast: Secondary | ICD-10-CM | POA: Diagnosis not present

## 2023-02-04 DIAGNOSIS — E871 Hypo-osmolality and hyponatremia: Secondary | ICD-10-CM | POA: Diagnosis present

## 2023-02-04 DIAGNOSIS — Z9842 Cataract extraction status, left eye: Secondary | ICD-10-CM

## 2023-02-04 DIAGNOSIS — Z91041 Radiographic dye allergy status: Secondary | ICD-10-CM

## 2023-02-04 DIAGNOSIS — I5030 Unspecified diastolic (congestive) heart failure: Secondary | ICD-10-CM | POA: Diagnosis present

## 2023-02-04 LAB — COMPREHENSIVE METABOLIC PANEL
ALT: 10 U/L (ref 0–44)
AST: 12 U/L — ABNORMAL LOW (ref 15–41)
Albumin: 3.8 g/dL (ref 3.5–5.0)
Alkaline Phosphatase: 74 U/L (ref 38–126)
Anion gap: 9 (ref 5–15)
BUN: 29 mg/dL — ABNORMAL HIGH (ref 8–23)
CO2: 28 mmol/L (ref 22–32)
Calcium: 9.6 mg/dL (ref 8.9–10.3)
Chloride: 94 mmol/L — ABNORMAL LOW (ref 98–111)
Creatinine, Ser: 1.38 mg/dL — ABNORMAL HIGH (ref 0.44–1.00)
GFR, Estimated: 36 mL/min — ABNORMAL LOW (ref >60)
Glucose, Bld: 113 mg/dL — ABNORMAL HIGH (ref 70–99)
Potassium: 4.5 mmol/L (ref 3.5–5.1)
Sodium: 131 mmol/L — ABNORMAL LOW (ref 135–145)
Total Bilirubin: 1.1 mg/dL (ref 0.3–1.2)
Total Protein: 7.5 g/dL (ref 6.5–8.1)

## 2023-02-04 LAB — CBC WITH DIFFERENTIAL/PLATELET
Abs Immature Granulocytes: 0.09 10*3/uL — ABNORMAL HIGH (ref 0.00–0.07)
Basophils Absolute: 0 10*3/uL (ref 0.0–0.1)
Basophils Relative: 0 %
Eosinophils Absolute: 0 10*3/uL (ref 0.0–0.5)
Eosinophils Relative: 0 %
HCT: 35.2 % — ABNORMAL LOW (ref 36.0–46.0)
Hemoglobin: 12 g/dL (ref 12.0–15.0)
Immature Granulocytes: 1 %
Lymphocytes Relative: 7 %
Lymphs Abs: 0.9 10*3/uL (ref 0.7–4.0)
MCH: 31.5 pg (ref 26.0–34.0)
MCHC: 34.1 g/dL (ref 30.0–36.0)
MCV: 92.4 fL (ref 80.0–100.0)
Monocytes Absolute: 1.3 10*3/uL — ABNORMAL HIGH (ref 0.1–1.0)
Monocytes Relative: 9 %
Neutro Abs: 11.8 10*3/uL — ABNORMAL HIGH (ref 1.7–7.7)
Neutrophils Relative %: 83 %
Platelets: 278 10*3/uL (ref 150–400)
RBC: 3.81 MIL/uL — ABNORMAL LOW (ref 3.87–5.11)
RDW: 15 % (ref 11.5–15.5)
WBC: 14.2 10*3/uL — ABNORMAL HIGH (ref 4.0–10.5)
nRBC: 0 % (ref 0.0–0.2)

## 2023-02-04 LAB — URINALYSIS, ROUTINE W REFLEX MICROSCOPIC
Bilirubin Urine: NEGATIVE
Glucose, UA: NEGATIVE mg/dL
Ketones, ur: NEGATIVE mg/dL
Nitrite: NEGATIVE
Protein, ur: NEGATIVE mg/dL
Specific Gravity, Urine: 1.006 (ref 1.005–1.030)
WBC, UA: >50 WBC/hpf (ref 0–5)
pH: 7 (ref 5.0–8.0)

## 2023-02-04 LAB — RESP PANEL BY RT-PCR (RSV, FLU A&B, COVID)  RVPGX2
Influenza A by PCR: NEGATIVE
Influenza B by PCR: NEGATIVE
Resp Syncytial Virus by PCR: NEGATIVE
SARS Coronavirus 2 by RT PCR: NEGATIVE

## 2023-02-04 LAB — LACTIC ACID, PLASMA
Lactic Acid, Venous: 0.8 mmol/L (ref 0.5–1.9)
Lactic Acid, Venous: 1 mmol/L (ref 0.5–1.9)

## 2023-02-04 LAB — PROTIME-INR
INR: 1.4 — ABNORMAL HIGH (ref 0.8–1.2)
Prothrombin Time: 17.5 seconds — ABNORMAL HIGH (ref 11.4–15.2)

## 2023-02-04 LAB — APTT: aPTT: 38 seconds — ABNORMAL HIGH (ref 24–36)

## 2023-02-04 MED ORDER — METOPROLOL TARTRATE 50 MG PO TABS
100.0000 mg | ORAL_TABLET | Freq: Two times a day (BID) | ORAL | Status: DC
  Administered 2023-02-05 – 2023-02-08 (×7): 100 mg via ORAL
  Filled 2023-02-04 (×8): qty 2

## 2023-02-04 MED ORDER — ACETAMINOPHEN 650 MG RE SUPP: 650.0000 mg | Freq: Four times a day (QID) | RECTAL | Status: DC | PRN

## 2023-02-04 MED ORDER — ONDANSETRON HCL 4 MG/2ML IJ SOLN: 4.0000 mg | Freq: Four times a day (QID) | INTRAMUSCULAR | Status: DC | PRN

## 2023-02-04 MED ORDER — SODIUM CHLORIDE 0.9 % IV BOLUS (SEPSIS)
500.0000 mL | Freq: Once | INTRAVENOUS | Status: AC
  Administered 2023-02-04: 500 mL via INTRAVENOUS

## 2023-02-04 MED ORDER — SODIUM CHLORIDE 0.9 % IV SOLN: 1.0000 g | Freq: Two times a day (BID) | INTRAVENOUS | Status: DC

## 2023-02-04 MED ORDER — DILTIAZEM HCL 30 MG PO TABS
60.0000 mg | ORAL_TABLET | Freq: Three times a day (TID) | ORAL | Status: DC
  Administered 2023-02-04 – 2023-02-08 (×12): 60 mg via ORAL
  Filled 2023-02-04 (×12): qty 2

## 2023-02-04 MED ORDER — PIPERACILLIN-TAZOBACTAM 3.375 G IVPB 30 MIN
3.3750 g | Freq: Once | INTRAVENOUS | Status: AC
  Administered 2023-02-04: 3.375 g via INTRAVENOUS
  Filled 2023-02-04: qty 50

## 2023-02-04 MED ORDER — ACETAMINOPHEN 325 MG PO TABS
650.0000 mg | ORAL_TABLET | Freq: Four times a day (QID) | ORAL | Status: DC | PRN
  Administered 2023-02-05 – 2023-02-08 (×2): 650 mg via ORAL
  Filled 2023-02-04 (×2): qty 2

## 2023-02-04 MED ORDER — APIXABAN 2.5 MG PO TABS
2.5000 mg | ORAL_TABLET | Freq: Two times a day (BID) | ORAL | Status: DC
  Administered 2023-02-05 – 2023-02-08 (×7): 2.5 mg via ORAL
  Filled 2023-02-04 (×8): qty 1

## 2023-02-04 MED ORDER — ONDANSETRON HCL 4 MG PO TABS: 4.0000 mg | ORAL_TABLET | Freq: Four times a day (QID) | ORAL | Status: DC | PRN

## 2023-02-04 NOTE — Progress Notes (Signed)
   Patient Name: Adrienne [name], Adrienne [name] DOB: [birth year] MRN: [id number] Transferring facility: [facility] Requesting provider: nanavati, md Reason for transfer:  89 yo WF hx of afib on Eliquis, diastolic chf, hx of prior CVA, chronic foley. presents to ER with burning with urination.  had foley changed out at [facility] 2-3 weeks ago. was on recently abx 2-3 weeks. hx of pseudomonas UTI. fever 101 at home. foley changed out in ER.  WBC 14.2. start IV zosyn.  Going to: [facility] Admission Status: inpatient Bed Type: med/surg To Do: may need ID consult to consider home IV abx  [facility] will assume care on arrival to accepting facility. Until arrival, medical decision making responsibilities remain with the EDP.  However, [facility] available 24/7 for questions and assistance.   Nursing staff please page [facility] Admits and Consults [phone]) as soon as the patient arrives to the hospital.  Carollee Herter, DO [facility]

## 2023-02-04 NOTE — ED Provider Notes (Signed)
[place] EMERGENCY DEPARTMENT AT [facility] Provider Note   CSN: [id number] Arrival date & time: 02/04/23  1453     History  Chief Complaint  Patient presents with   Dysuria   Fever   Cough    Aashvi Russell is a 89 y.o. female.  HPI     89 y.o. female past medical history significant for chronic atrial fibrillation on apixaban, chronic diastolic heart failure with moderate mitral regurgitation, stroke, uterine prolapse with chronic indwelling Foley catheter comes in with chief complaint of burning with urination, fever and cough.  Patient's cough has been present for over 2 months now.  She was admitted to the hospital for pneumonia in April.  She states that her cough never truly resolved.  She states that over the last week she has been having burning with urination, malaise and started feeling feverish.  She checked her temp today and it was over 100 degrees.  She had her Foley catheter changed 2 or 3 weeks ago at [facility] urology.  At that time she was given IM Rocephin for suspected UTI and sent home with prescription for UTI. Home Medications Prior to Admission medications   Medication Sig Start Date End Date Taking? Authorizing Provider  acetaminophen (TYLENOL) 500 MG tablet Take 500-1,000 mg by mouth every 12 (twelve) hours as needed (pain).     [provider]  albuterol (VENTOLIN HFA) 108 (90 Base) MCG/ACT inhaler Inhale 1 puff into the lungs every 6 (six) hours as needed for wheezing or shortness of breath (Cough). 09/08/22   Regalado, Belkys A, MD  allopurinol (ZYLOPRIM) 100 MG tablet Take 1 tablet (100 mg total) by mouth daily. 09/08/22   Regalado, Belkys A, MD  apixaban (ELIQUIS) 2.5 MG TABS tablet Take 1 tablet (2.5 mg total) by mouth 2 (two) times daily. 10/07/22   Jake Bathe, MD  ascorbic acid (VITAMIN C) 500 MG tablet Take 500 mg by mouth 2 (two) times daily.    [provider]  atorvastatin (LIPITOR) 10 MG tablet TAKE  1 TABLET DAILY 11/28/22   Jake Bathe, MD  B Complex Vitamins (VITAMIN B COMPLEX) CAPS Take 1 capsule by mouth daily.    [provider]  diltiazem (CARDIZEM) 60 MG tablet TAKE 1 TABLET THREE TIMES A DAY 11/28/22   Jake Bathe, MD  furosemide (LASIX) 20 MG tablet Take 1 tablet (20 mg total) by mouth daily. 01/31/23   Jake Bathe, MD  metoprolol tartrate (LOPRESSOR) 100 MG tablet TAKE 1 TABLET TWICE A DAY 11/28/22   Jake Bathe, MD  omeprazole (PRILOSEC) 10 MG capsule Take 10 mg by mouth every other day. 03/21/19   [provider]  Probiotic Product (UP4 PROBIOTICS WOMENS PO) Take 1 capsule by mouth daily with breakfast.    [provider]  spironolactone (ALDACTONE) 25 MG tablet Take 1 tablet (25 mg total) by mouth daily. 12/20/22   Marinda Elk, MD      Allergies    Valium [diazepam], Amiodarone hcl [amiodarone], Compazine [prochlorperazine], Other, Thorazine [chlorpromazine], Zometa [zoledronic acid], Ciprofloxacin, Doxycycline, Zoster vac recomb adjuvanted, and Iodinated contrast media    Review of Systems   Review of Systems  All other systems reviewed and are negative.   Physical Exam Updated Vital Signs BP (!) 148/71   Pulse 79   Temp 98.4 F (36.9 C) (Oral)   Resp (!) 22   LMP 09/12/1973 (Approximate)   SpO2 97%  Physical Exam Vitals and nursing  note reviewed. Exam conducted with a chaperone present.  Constitutional:      Appearance: She is well-developed.  HENT:     Head: Normocephalic and atraumatic.  Eyes:     Extraocular Movements: Extraocular movements intact.  Cardiovascular:     Rate and Rhythm: Normal rate.  Pulmonary:     Effort: Pulmonary effort is normal.  Abdominal:     Tenderness: There is no abdominal tenderness.  Genitourinary:    Comments: Examined the genitourinary area at the request of the nurse. Patient essentially has significant erythema over her urethra.  She also has uterine prolapse Musculoskeletal:      Cervical back: Normal range of motion and neck supple.  Skin:    General: Skin is dry.  Neurological:     Mental Status: She is alert and oriented to person, place, and time.     ED Results / Procedures / Treatments   Labs (all labs ordered are listed, but only abnormal results are displayed) Labs Reviewed  COMPREHENSIVE METABOLIC PANEL - Abnormal; Notable for the following components:      Result Value   Sodium 131 (*)    Chloride 94 (*)    Glucose, Bld 113 (*)    BUN 29 (*)    Creatinine, Ser 1.38 (*)    AST 12 (*)    GFR, Estimated 36 (*)    All other components within normal limits  CBC WITH DIFFERENTIAL/PLATELET - Abnormal; Notable for the following components:   WBC 14.2 (*)    RBC 3.81 (*)    HCT 35.2 (*)    Neutro Abs 11.8 (*)    Monocytes Absolute 1.3 (*)    Abs Immature Granulocytes 0.09 (*)    All other components within normal limits  PROTIME-INR - Abnormal; Notable for the following components:   Prothrombin Time 17.5 (*)    INR 1.4 (*)    All other components within normal limits  APTT - Abnormal; Notable for the following components:   aPTT 38 (*)    All other components within normal limits  URINALYSIS, ROUTINE W REFLEX MICROSCOPIC - Abnormal; Notable for the following components:   APPearance HAZY (*)    Hgb urine dipstick SMALL (*)    Leukocytes,Ua LARGE (*)    Bacteria, UA MANY (*)    All other components within normal limits  RESP PANEL BY RT-PCR (RSV, FLU A&B, COVID)  [id number]  URINE CULTURE  LACTIC ACID, PLASMA  LACTIC ACID, PLASMA    EKG None  Radiology DG Chest Port 1 View  Result Date: 02/04/2023 CLINICAL DATA:  Questionable sepsis. EXAM: PORTABLE CHEST 1 VIEW COMPARISON:  Jan 20, 2023 FINDINGS: Tortuosity and calcific atherosclerotic disease of the aorta. Cardiomediastinal silhouette is enlarged. Mediastinal contours appear intact. There is no evidence of focal airspace consolidation, pleural effusion or pneumothorax. Osseous structures  are without acute abnormality. Soft tissues are grossly normal. IMPRESSION: No active disease. Electronically Signed   By: Ted Mcalpine M.D.   On: 02/04/2023 16:53    Procedures Procedures    Medications Ordered in ED Medications  ceFEPIme (MAXIPIME) 1 g in sodium chloride 0.9 % 100 mL IVPB (has no administration in time range)  sodium chloride 0.9 % bolus 500 mL (0 mLs Intravenous Stopped 02/04/23 1735)    ED Course/ Medical Decision Making/ A&P Clinical Course as of 02/04/23 1906  Sat Feb 04, 2023  1906 Urinalysis, Routine w reflex microscopic -Urine, Catheterized(!) UA is indicating multiple markers consistent with UTI.  I  have reviewed previous urine cultures.  We will give her cefepime, especially in the light of her being given what appears to be Rocephin recently. [AN]  1906 Comprehensive metabolic panel(!) No organ damage seen on CMP.  Clinically not septic. [AN]    Clinical Course User Index [AN] Derwood Kaplan, MD                             Medical Decision Making Amount and/or Complexity of Data Reviewed Labs: ordered. Radiology: ordered. ECG/medicine tests: ordered.  Risk Decision regarding hospitalization.  89 year old female comes in with chief complaint of burning with urination, fevers, cough and malaise.  She has a history of uterine prolapse with chronic indwelling Foley catheter.  Last change was 2 or 3 weeks ago, when she was given antibiotics.  She also complains of cough that is chronic.  On exam, patient is a frail woman.  She has no abdominal tenderness.  She has uterine prolapse.  Currently she is fever free, but is not clear if she took anything for her home temperature.  I reviewed patient's chart including her recent discharge summary.  At that time she was admitted for CAP.  Urine cultures grew multiple species.  In the past however she has had E. coli and pseudomonal UTI.  Initial plan is to change patient's Foley catheter and evaluate  the urine.  Clinically she has UTI, but the differential diagnosis that was considered includes vaginitis, yeast infection, urethral trauma.  Pyelonephritis, and sepsis considered, however both deemed to be less likely.    Final Clinical Impression(s) / ED Diagnoses Final diagnoses:  Acute cystitis without hematuria    Rx / DC Orders ED Discharge Orders     None         Derwood Kaplan, MD 02/04/23 1906

## 2023-02-04 NOTE — H&P (Signed)
History and Physical    Patient: Adrienne [name] WUJ:[id number] DOB: 1928-03-23 DOA: 02/04/2023 DOS: the patient was seen and examined on 02/04/2023 PCP: Sigmund Hazel, MD  Patient coming from: Home  Chief Complaint:  Chief Complaint  Patient presents with   Dysuria   Fever   Cough   HPI: Adrienne [name] is a 89 y.o. female with medical history significant of afib on Eliquis, diastolic chf, hx of prior CVA, chronic foley.    Pt presents to ER with c/o burning with urination.  Pt had foley changed out at [facility] 2-3 weeks ago.  Pt was on recently abx 2-3 weeks following admission in April, UCx at that time was inconclusive according to discharge summary.  Pt has hx of pseudomonas UTI in dec.  Pt reports fever 101 at home.  Foley changed out in ER.  WBC 14.2. Started on IV zosyn.   Review of Systems: As mentioned in the history of present illness. All other systems reviewed and are negative. Past Medical History:  Diagnosis Date   Afib (HCC)    Arthritis of ankle    CAD (coronary artery disease)    Cancer (HCC) 2012   breast cancer-right   Compression fracture of T12 vertebra (HCC)    Diverticulosis    Edema    Elevated uric acid in blood    GERD (gastroesophageal reflux disease)    GI bleed 12/2015   Hyperlipidemia    Hypertension    Long-term (current) use of anticoagulants    Moderate mitral regurgitation    Personal history of radiation therapy    Pulmonary HTN (HCC)    Echo 1/19: EF 60-65, no RWMA, Ao sclerosis, trivial AI, mild MR, mod BAE, mod TR, PASP 49   Stroke (HCC) 01/25/2016   TIA (transient ischemic attack) 09/2011   Urinary incontinence    Urinary retention    Past Surgical History:  Procedure Laterality Date   APPENDECTOMY     AXILLARY SENTINEL NODE BIOPSY Left 01/22/2020   Procedure: Left Axillary Sentinel Node Biopsy;  Surgeon: Emelia Loron, MD;  Location: [facility] OR;  Service: General;  Laterality: Left;   BREAST LUMPECTOMY WITH  RADIOACTIVE SEED AND SENTINEL LYMPH NODE BIOPSY Left 01/22/2020   Procedure: LEFT BREAST LUMPECTOMY WITH RADIOACTIVE SEED;  Surgeon: Emelia Loron, MD;  Location: [facility] OR;  Service: General;  Laterality: Left;   BREAST SURGERY  2012   Rt.mastectomy--[place], [place]   CARDIAC CATHETERIZATION  2013   CATARACT EXTRACTION, BILATERAL     CERVICAL CONIZATION W/BX N/A 08/08/2020   Procedure: VAGINAL SIDEWALL BIOPSY;  Surgeon: Jerene Bears, MD;  Location: [facility] OR;  Service: Gynecology;  Laterality: N/A;   DILATION AND CURETTAGE OF UTERUS     in her early 54's for DUB   ESOPHAGOGASTRODUODENOSCOPY Left 01/08/2016   Procedure: ESOPHAGOGASTRODUODENOSCOPY (EGD);  Surgeon: Willis Modena, MD;  Location: [facility];  Service: Endoscopy;  Laterality: Left;   GALLBLADDER SURGERY     MASTECTOMY Right 2012   RADIOLOGY WITH ANESTHESIA N/A 01/25/2016   Procedure: RADIOLOGY WITH ANESTHESIA;  Surgeon: Julieanne Cotton, MD;  Location: [facility] OR;  Service: Radiology;  Laterality: N/A;   REPLACEMENT TOTAL KNEE BILATERAL     TONSILLECTOMY AND ADENOIDECTOMY     as a child   Social History:  reports that she quit smoking about 74 years ago. Her smoking use included cigarettes. She has a 0.50 pack-year smoking history. She has never used smokeless tobacco. She reports that she does not drink alcohol and does  not use drugs.  Allergies  Allergen Reactions   Valium [Diazepam] Other (See Comments)    HYPERACTIVITY    Amiodarone Hcl [Amiodarone] Other (See Comments)    A-Fib   Compazine [Prochlorperazine] Other (See Comments)    HYPERACTIVITY    Other Other (See Comments)    "Kidney dye"--patient states this gave her severe shakes/chills   Thorazine [Chlorpromazine] Other (See Comments)    HYPERACTIVITY    Zometa [Zoledronic Acid] Other (See Comments)    PROBLEMS WITH EYES  TIA   Ciprofloxacin Swelling and Other (See Comments)    "swelling" in Rt.elbow   Doxycycline Nausea Only   Zoster Vac Recomb Adjuvanted  Other (See Comments)    Welt under injection site   Iodinated Contrast Media Anxiety and Other (See Comments)    Chills and anxiety  Other Reaction(s): chills/shaking, Kidney dye    Family History  Problem Relation Age of Onset   Asthma Mother    CVA Father    Atrial fibrillation Sister        HAS PACER   Pneumonia Brother    Cancer Daughter        COLON CANCER   Cancer Maternal Grandmother        breast cancer    Prior to Admission medications   Medication Sig Start Date End Date Taking? Authorizing Provider  acetaminophen (TYLENOL) 500 MG tablet Take 500-1,000 mg by mouth every 12 (twelve) hours as needed (pain).     [provider]  albuterol (VENTOLIN HFA) 108 (90 Base) MCG/ACT inhaler Inhale 1 puff into the lungs every 6 (six) hours as needed for wheezing or shortness of breath (Cough). 09/08/22   Regalado, Belkys A, MD  allopurinol (ZYLOPRIM) 100 MG tablet Take 1 tablet (100 mg total) by mouth daily. 09/08/22   Regalado, Belkys A, MD  apixaban (ELIQUIS) 2.5 MG TABS tablet Take 1 tablet (2.5 mg total) by mouth 2 (two) times daily. 10/07/22   Jake Bathe, MD  ascorbic acid (VITAMIN C) 500 MG tablet Take 500 mg by mouth 2 (two) times daily.    [provider]  atorvastatin (LIPITOR) 10 MG tablet TAKE 1 TABLET DAILY 11/28/22   Jake Bathe, MD  B Complex Vitamins (VITAMIN B COMPLEX) CAPS Take 1 capsule by mouth daily.    [provider]  diltiazem (CARDIZEM) 60 MG tablet TAKE 1 TABLET THREE TIMES A DAY 11/28/22   Jake Bathe, MD  furosemide (LASIX) 20 MG tablet Take 1 tablet (20 mg total) by mouth daily. 01/31/23   Jake Bathe, MD  metoprolol tartrate (LOPRESSOR) 100 MG tablet TAKE 1 TABLET TWICE A DAY 11/28/22   Jake Bathe, MD  omeprazole (PRILOSEC) 10 MG capsule Take 10 mg by mouth every other day. 03/21/19   [provider]  Probiotic Product (UP4 PROBIOTICS WOMENS PO) Take 1 capsule by mouth daily with breakfast.    [provider]  spironolactone (ALDACTONE) 25 MG tablet Take 1 tablet (25 mg total) by mouth daily. 12/20/22   Marinda Elk, MD    Physical Exam: Vitals:   02/04/23 1934 02/04/23 1945 02/04/23 2030 02/04/23 2115  BP:  136/60 (!) 153/96 (!) 130/57  Pulse:  77 75 75  Resp:  17 17 (!) 27  Temp: 98.4 F (36.9 C)     TempSrc: Oral     SpO2:  95% 96% 100%   Constitutional: NAD, calm, comfortable Respiratory: clear to auscultation bilaterally, no wheezing, no crackles. Normal  respiratory effort. No accessory muscle use.  Cardiovascular: Regular rate and rhythm, no murmurs / rubs / gallops. No extremity edema. 2+ pedal pulses. No carotid bruits.  Abdomen: no tenderness, no masses palpated. No hepatosplenomegaly. Bowel sounds positive.  Neurologic: CN 2-12 grossly intact. Sensation intact, DTR normal. Strength 5/5 in all 4.  Psychiatric: Normal judgment and insight. Alert and oriented x 3. Normal mood.   Data Reviewed: {Tip this will not be part of the note when signed- Document your independent interpretation of telemetry tracing, EKG, lab, Radiology test or any other diagnostic tests. Add any new diagnostic test ordered today. (Optional):[id number]}   Labs on Admission: I have personally reviewed following labs and imaging studies  CBC: Recent Labs  Lab 02/04/23 1619  WBC 14.2*  NEUTROABS 11.8*  HGB 12.0  HCT 35.2*  MCV 92.4  PLT 278   Basic Metabolic Panel: Recent Labs  Lab 02/04/23 1619  NA 131*  K 4.5  CL 94*  CO2 28  GLUCOSE 113*  BUN 29*  CREATININE 1.38*  CALCIUM 9.6    Liver Function Tests: Recent Labs  Lab 02/04/23 1619  AST 12*  ALT 10  ALKPHOS 74  BILITOT 1.1  PROT 7.5  ALBUMIN 3.8    Coagulation Profile: Recent Labs  Lab 02/04/23 1619  INR 1.4*    Urine analysis:    Component Value Date/Time   COLORURINE YELLOW 02/04/2023 1812   APPEARANCEUR HAZY (A) 02/04/2023 1812   LABSPEC 1.006 02/04/2023 1812   PHURINE 7.0 02/04/2023 1812    GLUCOSEU NEGATIVE 02/04/2023 1812   HGBUR SMALL (A) 02/04/2023 1812   BILIRUBINUR NEGATIVE 02/04/2023 1812   BILIRUBINUR n 04/14/2020 1705   KETONESUR NEGATIVE 02/04/2023 1812   PROTEINUR NEGATIVE 02/04/2023 1812   UROBILINOGEN 0.2 04/14/2020 1705   NITRITE NEGATIVE 02/04/2023 1812   LEUKOCYTESUR LARGE (A) 02/04/2023 1812    Radiological Exams on Admission: DG Chest Port 1 View  Result Date: 02/04/2023 CLINICAL DATA:  Questionable sepsis. EXAM: PORTABLE CHEST 1 VIEW COMPARISON:  Jan 20, 2023 FINDINGS: Tortuosity and calcific atherosclerotic disease of the aorta. Cardiomediastinal silhouette is enlarged. Mediastinal contours appear intact. There is no evidence of focal airspace consolidation, pleural effusion or pneumothorax. Osseous structures are without acute abnormality. Soft tissues are grossly normal. IMPRESSION: No active disease. Electronically Signed   By: Ted Mcalpine M.D.   On: 02/04/2023 16:53     Assessment and Plan: * UTI (urinary tract infection) due to urinary indwelling catheter (HCC) Dysuria + reported temp 100 at home + WBC + positive UA Lactate nl Cont empiric zosyn UCx pending Tylenol PRN fever  Heart failure with preserved ejection fraction (HCC) Med rec pending, will hold off on reordering diuretics for the moment and see how she does overnight.  Essential hypertension Cont Cardizem and metoprolol Holding diuretics for the moment pending med rec and seeing how she does overnight.  Permanent atrial fibrillation (HCC) Cont eliquis, cardizem, metoprolol      Advance Care Planning:   Code Status: Full Code Full code despite age as per prior discussions in Dec 2023 with patient  Consults: None  Family Communication: ***  Severity of Illness: The appropriate patient status for this patient is OBSERVATION. Observation status is judged to be reasonable and necessary in order to provide the required intensity of service to ensure the patient's safety.  The patient's presenting symptoms, physical exam findings, and initial radiographic and laboratory data in the context of their medical condition is felt to place them at  decreased risk for further clinical deterioration. Furthermore, it is anticipated that the patient will be medically stable for discharge from the hospital within 2 midnights of admission.   Author: Hillary Bow., DO 02/04/2023 10:59 PM  For on call review [email or web address].

## 2023-02-04 NOTE — Assessment & Plan Note (Signed)
Cont Cardizem and metoprolol Holding diuretics for the moment pending med rec and seeing how she does overnight.

## 2023-02-04 NOTE — ED Notes (Signed)
[facility] here for patient transport. 

## 2023-02-04 NOTE — Assessment & Plan Note (Addendum)
Dysuria + reported temp 100 at home + WBC + positive UA Lactate nl Cont empiric zosyn UCx pending Tylenol PRN fever

## 2023-02-04 NOTE — Assessment & Plan Note (Signed)
Cont eliquis, cardizem, metoprolol

## 2023-02-04 NOTE — ED Notes (Signed)
ED TO INPATIENT HANDOFF REPORT  ED Nurse Name and Phone #:  Julious Payer [phone]  S Name/Age/Gender Adrienne [name] 89 y.o. female Room/Bed: DB010/DB010  Code Status   Code Status: Prior  Home/SNF/Other [facility] [phone] Patient oriented to: self, place, time, and situation Is this baseline? Yes   Triage Complete: Triage complete  Chief Complaint UTI (urinary tract infection) due to urinary indwelling catheter (HCC) [B14.782N, N39.0]  Triage Note Patient c/o cough since April Feels feverish and has burning "in my vaginal area" Has a foley.   Allergies Allergies  Allergen Reactions   Valium [Diazepam] Other (See Comments)    HYPERACTIVITY    Amiodarone Hcl [Amiodarone] Other (See Comments)    A-Fib   Compazine [Prochlorperazine] Other (See Comments)    HYPERACTIVITY    Other Other (See Comments)    "Kidney dye"--patient states this gave her severe shakes/chills   Thorazine [Chlorpromazine] Other (See Comments)    HYPERACTIVITY    Zometa [Zoledronic Acid] Other (See Comments)    PROBLEMS WITH EYES  TIA   Ciprofloxacin Swelling and Other (See Comments)    "swelling" in Rt.elbow   Doxycycline Nausea Only   Zoster Vac Recomb Adjuvanted Other (See Comments)    Welt under injection site   Iodinated Contrast Media Anxiety and Other (See Comments)    Chills and anxiety  Other Reaction(s): chills/shaking, Kidney dye    Level of Care/Admitting Diagnosis ED Disposition     ED Disposition  Admit   Condition  --   Comment  Hospital [facility] [[id number]]  Level of Care: Med-Surg [16]  May admit patient to [facility] or [facility] if equivalent level of care is available:: No  Interfacility transfer: Yes  Covid Evaluation: Asymptomatic - no recent exposure (last 10 days) testing not required  Diagnosis: UTI (urinary tract infection) due to urinary indwelling catheter [facility]) [[id number]]  Admitting Physician:  Magnus Ivan [[id number]]  Attending Physician: Magnus Ivan [[id number]]  Certification:: I certify this patient will need inpatient services for at least 2 midnights  Estimated Length of Stay: 5          B Medical/Surgery History Past Medical History:  Diagnosis Date   Afib (HCC)    Arthritis of ankle    CAD (coronary artery disease)    Cancer (HCC) 2012   breast cancer-right   Compression fracture of T12 vertebra (HCC)    Diverticulosis    Edema    Elevated uric acid in blood    GERD (gastroesophageal reflux disease)    GI bleed 12/2015   Hyperlipidemia    Hypertension    Long-term (current) use of anticoagulants    Moderate mitral regurgitation    Personal history of radiation therapy    Pulmonary HTN (HCC)    Echo 1/19: EF 60-65, no RWMA, Ao sclerosis, trivial AI, mild MR, mod BAE, mod TR, PASP 49   Stroke (HCC) 01/25/2016   TIA (transient ischemic attack) 09/2011   Urinary incontinence    Urinary retention    Past Surgical History:  Procedure Laterality Date   APPENDECTOMY     AXILLARY SENTINEL NODE BIOPSY Left 01/22/2020   Procedure: Left Axillary Sentinel Node Biopsy;  Surgeon: Emelia Loron, MD;  Location: [facility] OR;  Service: General;  Laterality: Left;   BREAST LUMPECTOMY WITH RADIOACTIVE SEED AND SENTINEL LYMPH NODE BIOPSY Left 01/22/2020   Procedure: LEFT BREAST LUMPECTOMY WITH RADIOACTIVE SEED;  Surgeon: Emelia Loron, MD;  Location: [facility] OR;  Service: General;  Laterality: Left;   BREAST SURGERY  2012   Rt.mastectomy--[place], [place]   CARDIAC CATHETERIZATION  2013   CATARACT EXTRACTION, BILATERAL     CERVICAL CONIZATION W/BX N/A 08/08/2020   Procedure: VAGINAL SIDEWALL BIOPSY;  Surgeon: Jerene Bears, MD;  Location: [facility] OR;  Service: Gynecology;  Laterality: N/A;   DILATION AND CURETTAGE OF UTERUS     in her early 29's for DUB   ESOPHAGOGASTRODUODENOSCOPY Left 01/08/2016   Procedure: ESOPHAGOGASTRODUODENOSCOPY (EGD);  Surgeon: Willis Modena,  MD;  Location: [facility];  Service: Endoscopy;  Laterality: Left;   GALLBLADDER SURGERY     MASTECTOMY Right 2012   RADIOLOGY WITH ANESTHESIA N/A 01/25/2016   Procedure: RADIOLOGY WITH ANESTHESIA;  Surgeon: Julieanne Cotton, MD;  Location: [facility] OR;  Service: Radiology;  Laterality: N/A;   REPLACEMENT TOTAL KNEE BILATERAL     TONSILLECTOMY AND ADENOIDECTOMY     as a child     A IV Location/Drains/Wounds Patient Lines/Drains/Airways Status     Active Line/Drains/Airways     Name Placement date Placement time Site Days   Peripheral IV 02/04/23 20 G 1" Left Antecubital 02/04/23  1617  Antecubital  less than 1   Urethral Catheter AGC 14 Fr. 02/04/23  1706  --  less than 1            Intake/Output Last 24 hours  Intake/Output Summary (Last 24 hours) at 02/04/2023 2028 Last data filed at 02/04/2023 1735 Gross per 24 hour  Intake 500 ml  Output 550 ml  Net -50 ml    Labs/Imaging Results for orders placed or performed during the hospital encounter of 02/04/23 (from the past 48 hour(s))  Resp panel by RT-PCR (RSV, Flu A&B, Covid) Anterior Nasal Swab     Status: None   Collection Time: 02/04/23  3:05 PM   Specimen: Anterior Nasal Swab  Result Value Ref Range   SARS Coronavirus 2 by RT PCR NEGATIVE NEGATIVE    Comment: (NOTE) SARS-CoV-2 target nucleic acids are NOT DETECTED.  The SARS-CoV-2 RNA is generally detectable in upper respiratory specimens during the acute phase of infection. The lowest concentration of SARS-CoV-2 viral copies this assay can detect is 138 copies/mL. A negative result does not preclude SARS-Cov-2 infection and should not be used as the sole basis for treatment or other patient management decisions. A negative result may occur with  improper specimen collection/handling, submission of specimen other than nasopharyngeal swab, presence of viral mutation(s) within the areas targeted by this assay, and inadequate number of viral copies(<138 copies/mL). A  negative result must be combined with clinical observations, patient history, and epidemiological information. The expected result is Negative.  Fact Sheet for Patients:  BloggerCourse.com  Fact Sheet for Healthcare Providers:  SeriousBroker.it  This test is no t yet approved or cleared by the [place] FDA and  has been authorized for detection and/or diagnosis of SARS-CoV-2 by FDA under an Emergency Use Authorization (EUA). This EUA will remain  in effect (meaning this test can be used) for the duration of the COVID-19 declaration under Section 564(b)(1) of the Act, 21 U.S.C.section 360bbb-3(b)(1), unless the authorization is terminated  or revoked sooner.       Influenza A by PCR NEGATIVE NEGATIVE   Influenza B by PCR NEGATIVE NEGATIVE    Comment: (NOTE) The Xpert Xpress SARS-CoV-2/FLU/RSV plus assay is intended as an aid in the diagnosis of influenza from Nasopharyngeal swab specimens and should not be used as a sole basis for treatment.  Nasal washings and aspirates are unacceptable for Xpert Xpress SARS-CoV-2/FLU/RSV testing.  Fact Sheet for Patients: BloggerCourse.com  Fact Sheet for Healthcare Providers: SeriousBroker.it  This test is not yet approved or cleared by the [place] FDA and has been authorized for detection and/or diagnosis of SARS-CoV-2 by FDA under an Emergency Use Authorization (EUA). This EUA will remain in effect (meaning this test can be used) for the duration of the COVID-19 declaration under Section 564(b)(1) of the Act, 21 U.S.C. section 360bbb-3(b)(1), unless the authorization is terminated or revoked.     Resp Syncytial Virus by PCR NEGATIVE NEGATIVE    Comment: (NOTE) Fact Sheet for Patients: BloggerCourse.com  Fact Sheet for Healthcare Providers: SeriousBroker.it  This test is not  yet approved or cleared by the [place] FDA and has been authorized for detection and/or diagnosis of SARS-CoV-2 by FDA under an Emergency Use Authorization (EUA). This EUA will remain in effect (meaning this test can be used) for the duration of the COVID-19 declaration under Section 564(b)(1) of the Act, 21 U.S.C. section 360bbb-3(b)(1), unless the authorization is terminated or revoked.  Performed at [facility], [address] [place], [place] [address]   Lactic acid, plasma     Status: None   Collection Time: 02/04/23  4:19 PM  Result Value Ref Range   Lactic Acid, Venous 0.8 0.5 - 1.9 mmol/L    Comment: Performed at [facility], [address] [place], [place] [address]  Comprehensive metabolic panel     Status: Abnormal   Collection Time: 02/04/23  4:19 PM  Result Value Ref Range   Sodium 131 (L) 135 - 145 mmol/L   Potassium 4.5 3.5 - 5.1 mmol/L   Chloride 94 (L) 98 - 111 mmol/L   CO2 28 22 - 32 mmol/L   Glucose, Bld 113 (H) 70 - 99 mg/dL    Comment: Glucose reference range applies only to samples taken after fasting for at least 8 hours.   BUN 29 (H) 8 - 23 mg/dL   Creatinine, Ser 0.98 (H) 0.44 - 1.00 mg/dL   Calcium 9.6 8.9 - 11.9 mg/dL   Total Protein 7.5 6.5 - 8.1 g/dL   Albumin 3.8 3.5 - 5.0 g/dL   AST 12 (L) 15 - 41 U/L   ALT 10 0 - 44 U/L   Alkaline Phosphatase 74 38 - 126 U/L   Total Bilirubin 1.1 0.3 - 1.2 mg/dL   GFR, Estimated 36 (L) >60 mL/min    Comment: (NOTE) Calculated using the CKD-EPI Creatinine Equation (2021)    Anion gap 9 5 - 15    Comment: Performed at [facility], [address], [place], [place] [address]  CBC with Differential     Status: Abnormal   Collection Time: 02/04/23  4:19 PM  Result Value Ref Range   WBC 14.2 (H) 4.0 - 10.5 K/uL   RBC 3.81 (L) 3.87 - 5.11 MIL/uL   Hemoglobin 12.0 12.0 - 15.0 g/dL   HCT 95.6 (L) 21.3 - 08.6 %   MCV 92.4 80.0 - 100.0 fL   MCH  31.5 26.0 - 34.0 pg   MCHC 34.1 30.0 - 36.0 g/dL   RDW 57.8 46.9 - 62.9 %   Platelets 278 150 - 400 K/uL   nRBC 0.0 0.0 - 0.2 %   Neutrophils Relative % 83 %   Neutro Abs 11.8 (H) 1.7 - 7.7 K/uL   Lymphocytes Relative 7 %   Lymphs Abs 0.9 0.7 - 4.0  K/uL   Monocytes Relative 9 %   Monocytes Absolute 1.3 (H) 0.1 - 1.0 K/uL   Eosinophils Relative 0 %   Eosinophils Absolute 0.0 0.0 - 0.5 K/uL   Basophils Relative 0 %   Basophils Absolute 0.0 0.0 - 0.1 K/uL   Immature Granulocytes 1 %   Abs Immature Granulocytes 0.09 (H) 0.00 - 0.07 K/uL    Comment: Performed at [facility], [address], [place], [place] [address]  Protime-INR     Status: Abnormal   Collection Time: 02/04/23  4:19 PM  Result Value Ref Range   Prothrombin Time 17.5 (H) 11.4 - 15.2 seconds   INR 1.4 (H) 0.8 - 1.2    Comment: (NOTE) INR goal varies based on device and disease states. Performed at [facility], [address] [place], [place] [address]   APTT     Status: Abnormal   Collection Time: 02/04/23  4:19 PM  Result Value Ref Range   aPTT 38 (H) 24 - 36 seconds    Comment:        IF BASELINE aPTT IS ELEVATED, SUGGEST PATIENT RISK ASSESSMENT BE USED TO DETERMINE APPROPRIATE ANTICOAGULANT THERAPY. Performed at [facility], [address] [place], [place] [address]   Urinalysis, Routine w reflex microscopic -Urine, Catheterized     Status: Abnormal   Collection Time: 02/04/23  6:12 PM  Result Value Ref Range   Color, Urine YELLOW YELLOW   APPearance HAZY (A) CLEAR   Specific Gravity, Urine 1.006 1.005 - 1.030   pH 7.0 5.0 - 8.0   Glucose, UA NEGATIVE NEGATIVE mg/dL   Hgb urine dipstick SMALL (A) NEGATIVE   Bilirubin Urine NEGATIVE NEGATIVE   Ketones, ur NEGATIVE NEGATIVE mg/dL   Protein, ur NEGATIVE NEGATIVE mg/dL   Nitrite NEGATIVE NEGATIVE   Leukocytes,Ua LARGE (A) NEGATIVE   RBC / HPF 21-50 0 - 5 RBC/hpf   WBC, UA >50 0 - 5 WBC/hpf    Bacteria, UA MANY (A) NONE SEEN   Squamous Epithelial / HPF 0-5 0 - 5 /HPF   WBC Clumps PRESENT     Comment: Performed at [facility], [address] [place], [place] [address]   DG Chest Port 1 View  Result Date: 02/04/2023 CLINICAL DATA:  Questionable sepsis. EXAM: PORTABLE CHEST 1 VIEW COMPARISON:  Jan 20, 2023 FINDINGS: Tortuosity and calcific atherosclerotic disease of the aorta. Cardiomediastinal silhouette is enlarged. Mediastinal contours appear intact. There is no evidence of focal airspace consolidation, pleural effusion or pneumothorax. Osseous structures are without acute abnormality. Soft tissues are grossly normal. IMPRESSION: No active disease. Electronically Signed   By: Ted Mcalpine M.D.   On: 02/04/2023 16:53    Pending Labs Unresulted Labs (From admission, onward)     Start     Ordered   02/04/23 1947  Lactic acid, plasma  (Undifferentiated presentation (screening labs and basic nursing orders))  STAT Now then every 2 hours,   STAT        02/04/23 1903   02/04/23 1548  Remove and replace urinary cath (placed > 5 days) then obtain urine culture from new indwelling urinary catheter.  (Undifferentiated presentation (screening labs and basic nursing orders))  Once,   URGENT       Question:  Indication  Answer:  Dysuria   02/04/23 1548            Vitals/Pain Today's Vitals   02/04/23 1730 02/04/23 1900 02/04/23 1934 02/04/23 1945  BP: (!) 148/71  105/86  136/60  Pulse: 79 65  77  Resp: (!) 22 18  17   Temp:   98.4 F (36.9 C)   TempSrc:   Oral   SpO2: 97% 93%  95%  PainSc:        Isolation Precautions No active isolations  Medications Medications  sodium chloride 0.9 % bolus 500 mL (0 mLs Intravenous Stopped 02/04/23 1735)  piperacillin-tazobactam (ZOSYN) IVPB 3.375 g (3.375 g Intravenous New Bag/Given 02/04/23 1946)    Mobility walks with walker     Focused Assessments Patient  presented with foley, foley changed out by  day shift RN, approx 1500 ml output at this time, denies abdominal pain at this time, no n/v/d, endorses burning to vaginal area.   R Recommendations: See Admitting Provider Note  Report given to:   Additional Notes: Patient has received 3g zosyn by IV.

## 2023-02-04 NOTE — ED Notes (Signed)
Report received from [place], [place]. Per MD Rhunette Croft, patient ok to eat/drink. Awaiting adjusted antibiotic order as cefepime is not stocked in pyxis. Family aware of plan of care. Patient resting quietly in stretcher, respirations even, unlabored, no acute distress noted. Denies needs at this time.

## 2023-02-04 NOTE — ED Triage Notes (Signed)
Patient c/o cough since April Feels feverish and has burning "in my vaginal area" Has a foley.

## 2023-02-04 NOTE — Assessment & Plan Note (Addendum)
Med rec pending, will hold off on reordering diuretics for the moment and see how she does overnight.

## 2023-02-05 ENCOUNTER — Observation Stay (HOSPITAL_COMMUNITY): Payer: Medicare Other

## 2023-02-05 DIAGNOSIS — I4821 Permanent atrial fibrillation: Secondary | ICD-10-CM | POA: Diagnosis present

## 2023-02-05 DIAGNOSIS — K219 Gastro-esophageal reflux disease without esophagitis: Secondary | ICD-10-CM | POA: Diagnosis present

## 2023-02-05 DIAGNOSIS — I700 Atherosclerosis of aorta: Secondary | ICD-10-CM | POA: Diagnosis not present

## 2023-02-05 DIAGNOSIS — N390 Urinary tract infection, site not specified: Secondary | ICD-10-CM | POA: Diagnosis present

## 2023-02-05 DIAGNOSIS — T83511D Infection and inflammatory reaction due to indwelling urethral catheter, subsequent encounter: Secondary | ICD-10-CM | POA: Diagnosis not present

## 2023-02-05 DIAGNOSIS — Z1611 Resistance to penicillins: Secondary | ICD-10-CM | POA: Diagnosis present

## 2023-02-05 DIAGNOSIS — E871 Hypo-osmolality and hyponatremia: Secondary | ICD-10-CM | POA: Diagnosis present

## 2023-02-05 DIAGNOSIS — I5032 Chronic diastolic (congestive) heart failure: Secondary | ICD-10-CM | POA: Diagnosis present

## 2023-02-05 DIAGNOSIS — Z87891 Personal history of nicotine dependence: Secondary | ICD-10-CM | POA: Diagnosis not present

## 2023-02-05 DIAGNOSIS — Z96653 Presence of artificial knee joint, bilateral: Secondary | ICD-10-CM | POA: Diagnosis present

## 2023-02-05 DIAGNOSIS — R059 Cough, unspecified: Secondary | ICD-10-CM | POA: Diagnosis present

## 2023-02-05 DIAGNOSIS — I340 Nonrheumatic mitral (valve) insufficiency: Secondary | ICD-10-CM | POA: Diagnosis present

## 2023-02-05 DIAGNOSIS — I130 Hypertensive heart and chronic kidney disease with heart failure and stage 1 through stage 4 chronic kidney disease, or unspecified chronic kidney disease: Secondary | ICD-10-CM | POA: Diagnosis present

## 2023-02-05 DIAGNOSIS — T83518A Infection and inflammatory reaction due to other urinary catheter, initial encounter: Secondary | ICD-10-CM | POA: Diagnosis present

## 2023-02-05 DIAGNOSIS — M109 Gout, unspecified: Secondary | ICD-10-CM | POA: Diagnosis present

## 2023-02-05 DIAGNOSIS — N1832 Chronic kidney disease, stage 3b: Secondary | ICD-10-CM | POA: Diagnosis present

## 2023-02-05 DIAGNOSIS — N814 Uterovaginal prolapse, unspecified: Secondary | ICD-10-CM | POA: Diagnosis present

## 2023-02-05 DIAGNOSIS — B9620 Unspecified Escherichia coli [E. coli] as the cause of diseases classified elsewhere: Secondary | ICD-10-CM | POA: Diagnosis present

## 2023-02-05 DIAGNOSIS — Z923 Personal history of irradiation: Secondary | ICD-10-CM | POA: Diagnosis not present

## 2023-02-05 DIAGNOSIS — I2510 Atherosclerotic heart disease of native coronary artery without angina pectoris: Secondary | ICD-10-CM | POA: Diagnosis present

## 2023-02-05 DIAGNOSIS — Z79899 Other long term (current) drug therapy: Secondary | ICD-10-CM | POA: Diagnosis not present

## 2023-02-05 DIAGNOSIS — Z853 Personal history of malignant neoplasm of breast: Secondary | ICD-10-CM | POA: Diagnosis not present

## 2023-02-05 DIAGNOSIS — Z9011 Acquired absence of right breast and nipple: Secondary | ICD-10-CM | POA: Diagnosis not present

## 2023-02-05 DIAGNOSIS — E785 Hyperlipidemia, unspecified: Secondary | ICD-10-CM | POA: Diagnosis present

## 2023-02-05 DIAGNOSIS — Z1152 Encounter for screening for COVID-19: Secondary | ICD-10-CM | POA: Diagnosis not present

## 2023-02-05 DIAGNOSIS — N3000 Acute cystitis without hematuria: Secondary | ICD-10-CM | POA: Diagnosis present

## 2023-02-05 DIAGNOSIS — Y846 Urinary catheterization as the cause of abnormal reaction of the patient, or of later complication, without mention of misadventure at the time of the procedure: Secondary | ICD-10-CM | POA: Diagnosis present

## 2023-02-05 DIAGNOSIS — E860 Dehydration: Secondary | ICD-10-CM | POA: Diagnosis present

## 2023-02-05 LAB — CBC
HCT: 32 % — ABNORMAL LOW (ref 36.0–46.0)
Hemoglobin: 10.8 g/dL — ABNORMAL LOW (ref 12.0–15.0)
MCH: 31.5 pg (ref 26.0–34.0)
MCHC: 33.8 g/dL (ref 30.0–36.0)
MCV: 93.3 fL (ref 80.0–100.0)
Platelets: 249 10*3/uL (ref 150–400)
RBC: 3.43 MIL/uL — ABNORMAL LOW (ref 3.87–5.11)
RDW: 14.9 % (ref 11.5–15.5)
WBC: 13.5 10*3/uL — ABNORMAL HIGH (ref 4.0–10.5)
nRBC: 0 % (ref 0.0–0.2)

## 2023-02-05 LAB — BASIC METABOLIC PANEL
Anion gap: 7 (ref 5–15)
BUN: 24 mg/dL — ABNORMAL HIGH (ref 8–23)
CO2: 26 mmol/L (ref 22–32)
Calcium: 8.5 mg/dL — ABNORMAL LOW (ref 8.9–10.3)
Chloride: 100 mmol/L (ref 98–111)
Creatinine, Ser: 1.35 mg/dL — ABNORMAL HIGH (ref 0.44–1.00)
GFR, Estimated: 37 mL/min — ABNORMAL LOW (ref >60)
Glucose, Bld: 101 mg/dL — ABNORMAL HIGH (ref 70–99)
Potassium: 4.2 mmol/L (ref 3.5–5.1)
Sodium: 133 mmol/L — ABNORMAL LOW (ref 135–145)

## 2023-02-05 LAB — URINE CULTURE

## 2023-02-05 MED ORDER — ALLOPURINOL 100 MG PO TABS
100.0000 mg | ORAL_TABLET | Freq: Every day | ORAL | Status: DC
  Administered 2023-02-05 – 2023-02-08 (×4): 100 mg via ORAL
  Filled 2023-02-05 (×4): qty 1

## 2023-02-05 MED ORDER — POLYETHYLENE GLYCOL 3350 17 G PO PACK
17.0000 g | PACK | Freq: Every day | ORAL | Status: DC
  Administered 2023-02-05 – 2023-02-07 (×3): 17 g via ORAL
  Filled 2023-02-05 (×4): qty 1

## 2023-02-05 MED ORDER — PIPERACILLIN-TAZOBACTAM 3.375 G IVPB
3.3750 g | Freq: Two times a day (BID) | INTRAVENOUS | Status: DC
  Administered 2023-02-05 – 2023-02-06 (×3): 3.375 g via INTRAVENOUS
  Filled 2023-02-05 (×3): qty 50

## 2023-02-05 MED ORDER — ATORVASTATIN CALCIUM 10 MG PO TABS
10.0000 mg | ORAL_TABLET | Freq: Every day | ORAL | Status: DC
  Administered 2023-02-05 – 2023-02-08 (×4): 10 mg via ORAL
  Filled 2023-02-05 (×4): qty 1

## 2023-02-05 MED ORDER — SPIRONOLACTONE 25 MG PO TABS
25.0000 mg | ORAL_TABLET | Freq: Every day | ORAL | Status: DC
  Administered 2023-02-05 – 2023-02-08 (×4): 25 mg via ORAL
  Filled 2023-02-05 (×4): qty 1

## 2023-02-05 MED ORDER — CHLORHEXIDINE GLUCONATE CLOTH 2 % EX PADS
6.0000 | MEDICATED_PAD | Freq: Every day | CUTANEOUS | Status: DC
  Administered 2023-02-05 – 2023-02-08 (×4): 6 via TOPICAL

## 2023-02-05 MED ORDER — PANTOPRAZOLE SODIUM 40 MG PO TBEC
40.0000 mg | DELAYED_RELEASE_TABLET | Freq: Every day | ORAL | Status: DC
  Administered 2023-02-05 – 2023-02-08 (×4): 40 mg via ORAL
  Filled 2023-02-05 (×4): qty 1

## 2023-02-05 NOTE — Progress Notes (Signed)
Pharmacy Antibiotic Note  Adrienne [name] is a 89 y.o. female admitted on 02/04/2023 with UTI w// h/o Pseudomonas in urine Dec 2023.  Pharmacy has been consulted for Zosyn dosing.  Plan: Zosyn 3.375g IV Q12H (4-hour infusion).  Height: 4\' 11"  (149.9 cm) Weight: 54.9 kg (121 lb) IBW/kg (Calculated) : 43.2  Temp (24hrs), Avg:98.5 F (36.9 C), Min:98.4 F (36.9 C), Max:98.6 F (37 C)  Recent Labs  Lab 02/04/23 1619 02/04/23 2316  WBC 14.2*  --   CREATININE 1.38*  --   LATICACIDVEN 0.8 1.0    Estimated Creatinine Clearance: 19.3 mL/min (A) (by C-G formula based on SCr of 1.38 mg/dL (H)).    Allergies  Allergen Reactions   Valium [Diazepam] Other (See Comments)    HYPERACTIVITY    Amiodarone Hcl [Amiodarone] Other (See Comments)    A-Fib   Compazine [Prochlorperazine] Other (See Comments)    HYPERACTIVITY    Other Other (See Comments)    "Kidney dye"--patient states this gave her severe shakes/chills   Thorazine [Chlorpromazine] Other (See Comments)    HYPERACTIVITY    Zometa [Zoledronic Acid] Other (See Comments)    PROBLEMS WITH EYES  TIA   Ciprofloxacin Swelling and Other (See Comments)    "swelling" in Rt.elbow   Doxycycline Nausea Only   Zoster Vac Recomb Adjuvanted Other (See Comments)    Welt under injection site   Iodinated Contrast Media Anxiety and Other (See Comments)    Chills and anxiety  Other Reaction(s): chills/shaking, Kidney dye    Thank you for allowing pharmacy to be a part of this patient's care.  Vernard Gambles, PharmD, BCPS  02/05/2023 2:18 AM

## 2023-02-05 NOTE — Assessment & Plan Note (Signed)
Patient also has ongoing cough for past couple of weeks. CXR today is negative Getting put on zosyn anyhow for the UTI. Will put on tessalon PRN for cough suppression If persists, may want to get CT.

## 2023-02-05 NOTE — Progress Notes (Signed)
PROGRESS NOTE    Adrienne [name]  ZOX:[id number]  DOB: December 28, 1927  DOA: 02/04/2023 PCP: Adrienne Hazel, MD Outpatient Specialists:   Hospital course:  89 year old female with history of HTN, GI bleed, atrial fibrillation, HFpEF, remote history of breast cancer, and prior CVA with chronic indwelling Foley is admitted with recurrent UTI, fever.  She grew out Pseudomonas in December and was treated with ceftriaxone and Augmentin last month for another UTI, and no clear culture.  Foley was changed out in the ED.  Patient was transferred to [facility] for possible need for ID consult given multiple antibiotics and recurrent UTIs.  Subjective:  Patient states that her dysuria has gone.  However notes that she was told that the Foley was almost entirely out of her in the ED and that discomfort in her urinary area resolved almost immediately after her Foley was changed out.  Patient states that she has had a cough for several months now.  It is nonproductive.  However does worry her because it is not resolving.  No shortness of breath.  No hemoptysis.   Objective: Vitals:   02/04/23 2337 02/04/23 2341 02/05/23 0503 02/05/23 0819  BP: (!) 134/100  (!) 122/55 (!) 141/53  Pulse: 68  (!) 48 70  Resp: (!) 21  16 18   Temp: 98.6 F (37 C)  98.5 F (36.9 C) 98.3 F (36.8 C)  TempSrc: Oral   Oral  SpO2: 99%  94% 96%  Weight:  54.9 kg    Height:  4\' 11"  (1.499 m)      Intake/Output Summary (Last 24 hours) at 02/05/2023 1556 Last data filed at 02/05/2023 1424 Gross per 24 hour  Intake 542 ml  Output 3100 ml  Net -2558 ml   Filed Weights   02/04/23 2341  Weight: 54.9 kg     Exam:  General: Relatively well-appearing female looking younger than stated age sitting up in bed in NAD Eyes: sclera anicteric, conjuctiva mild injection bilaterally CVS: S1-S2, regular  Respiratory:  decreased air entry bilaterally secondary to decreased inspiratory effort, rales at bases  GI: NABS,  soft, NT  LE: Warm and well-perfused Neuro: A/O x 3,  grossly nonfocal.  Psych: patient is logical and coherent, judgement and insight appear normal, mood and affect appropriate to situation.  Data Reviewed:  Basic Metabolic Panel: Recent Labs  Lab 02/04/23 1619 02/05/23 0713  NA 131* 133*  K 4.5 4.2  CL 94* 100  CO2 28 26  GLUCOSE 113* 101*  BUN 29* 24*  CREATININE 1.38* 1.35*  CALCIUM 9.6 8.5*    CBC: Recent Labs  Lab 02/04/23 1619 02/05/23 0713  WBC 14.2* 13.5*  NEUTROABS 11.8*  --   HGB 12.0 10.8*  HCT 35.2* 32.0*  MCV 92.4 93.3  PLT 278 249     Scheduled Meds:  apixaban  2.5 mg Oral BID   diltiazem  60 mg Oral TID   metoprolol tartrate  100 mg Oral BID   polyethylene glycol  17 g Oral Daily   Continuous Infusions:  piperacillin-tazobactam (ZOSYN)  IV Stopped (02/05/23 0719)     Assessment & Plan:   Recurrent UTI with chronic indwelling catheter Urine growing out greater than 100 K GNR Continue Zosyn day #2 Had previously grown out Pseudomonas in December Discussed with Dr. Daiva [name] who note there is no need for an ID consult, to continue Zosyn for now and narrow as per microbiology urine culture. Foley was changed out in ED.  Persistent cough  for several months Chest x-ray is negative however previous CT several years ago showed nodules which were stable on CT.  Patient with history of breast cancer in the past. Will order noncontrast chest CT today  Hyponatremia CKD 3B Hyponatremia somewhat improved with treatment of infection and hydration Patient is asymptomatic, creatinine at baseline Patient is eating and drinking well, no need for further fluid resuscitation Await results of Noncon chest CT has some level of SIADH may be contributory  HTN  HFpEF Continue metoprolol and diltiazem Diuretics are being held for now, can restart tomorrow  Atrial fibrillation Metoprolol and Cardizem for rate control Eliquis for secondary stroke  prevention    DVT prophylaxis: Eliquis Code Status: Full code Family Communication: Patient's husband was at bedside throughout    Studies: DG Chest Port 1 View  Result Date: 02/04/2023 CLINICAL DATA:  Questionable sepsis. EXAM: PORTABLE CHEST 1 VIEW COMPARISON:  Jan 20, 2023 FINDINGS: Tortuosity and calcific atherosclerotic disease of the aorta. Cardiomediastinal silhouette is enlarged. Mediastinal contours appear intact. There is no evidence of focal airspace consolidation, pleural effusion or pneumothorax. Osseous structures are without acute abnormality. Soft tissues are grossly normal. IMPRESSION: No active disease. Electronically Signed   By: Adrienne [name] M.D.   On: 02/04/2023 16:53    Principal Problem:   UTI (urinary tract infection) due to urinary indwelling catheter (HCC) Active Problems:   Cough   Heart failure with preserved ejection fraction (HCC)   Permanent atrial fibrillation (HCC)   Essential hypertension   Catheter-associated urinary tract infection (HCC)     Adrienne [name] Adrienne [name], [facility]  If 7PM-7AM, please contact night-coverage [email or web address]   LOS: 1 day

## 2023-02-06 DIAGNOSIS — T83511D Infection and inflammatory reaction due to indwelling urethral catheter, subsequent encounter: Secondary | ICD-10-CM | POA: Diagnosis not present

## 2023-02-06 DIAGNOSIS — N390 Urinary tract infection, site not specified: Secondary | ICD-10-CM | POA: Diagnosis not present

## 2023-02-06 LAB — URINE CULTURE: Culture: >=100000 — AB

## 2023-02-06 MED ORDER — SODIUM CHLORIDE 0.9 % IV SOLN
1.0000 g | INTRAVENOUS | Status: DC
  Administered 2023-02-06: 1 g via INTRAVENOUS
  Filled 2023-02-06 (×2): qty 10

## 2023-02-06 MED ORDER — ALBUTEROL SULFATE (2.5 MG/3ML) 0.083% IN NEBU: 2.5000 mg | INHALATION_SOLUTION | RESPIRATORY_TRACT | Status: DC | PRN

## 2023-02-06 NOTE — Progress Notes (Signed)
PROGRESS NOTE    Adrienne [name]  UEA:[id number]  DOB: 1927-11-02  DOA: 02/04/2023 PCP: Adrienne Hazel, MD Outpatient Specialists:   Hospital course:  89 year old female with history of HTN, GI bleed, atrial fibrillation, HFpEF, remote history of breast cancer, and prior CVA with chronic indwelling Foley is admitted with recurrent UTI, fever.  She grew out Pseudomonas in December and was treated with ceftriaxone and Augmentin last month for another UTI, and no clear culture.  Foley was changed out in the ED.  Patient was transferred to [facility] for possible need for ID consult given multiple antibiotics and recurrent UTIs.  Subjective:  Patient without new complaints.  We reviewed the results of her noncontrast chest CT.  We also reviewed that E. coli that is growing out of her urine is only moderately sensitive to penicillins and that the last couple of times that she has been treated for UTI it has been with Augmentin and Zosyn which could account for the recurrence that she has been having.  Objective: Vitals:   02/05/23 1927 02/06/23 0501 02/06/23 0802 02/06/23 1515  BP: (!) 118/51 (!) 140/61 (!) 127/55 (!) 133/53  Pulse: (!) 109 66 71 61  Resp: 15 18 18 18   Temp: 98 F (36.7 C) 98 F (36.7 C) 97.7 F (36.5 C) 98.2 F (36.8 C)  TempSrc: Oral  Oral Oral  SpO2: 96% 95% 94% 97%  Weight:      Height:        Intake/Output Summary (Last 24 hours) at 02/06/2023 1611 Last data filed at 02/06/2023 1252 Gross per 24 hour  Intake 50 ml  Output 2300 ml  Net -2250 ml    Filed Weights   02/04/23 2341  Weight: 54.9 kg     Exam:  General: Relatively well-appearing female looking younger than stated age sitting up in bed in NAD Eyes: sclera anicteric, conjuctiva mild injection bilaterally CVS: S1-S2, regular  Respiratory:  decreased air entry bilaterally secondary to decreased inspiratory effort, rales at bases  GI: NABS, soft, NT  LE: Warm and  well-perfused Neuro: A/O x 3,  grossly nonfocal.  Psych: patient is logical and coherent, judgement and insight appear normal, mood and affect appropriate to situation.  Data Reviewed:  Basic Metabolic Panel: Recent Labs  Lab 02/04/23 1619 02/05/23 0713  NA 131* 133*  K 4.5 4.2  CL 94* 100  CO2 28 26  GLUCOSE 113* 101*  BUN 29* 24*  CREATININE 1.38* 1.35*  CALCIUM 9.6 8.5*     CBC: Recent Labs  Lab 02/04/23 1619 02/05/23 0713  WBC 14.2* 13.5*  NEUTROABS 11.8*  --   HGB 12.0 10.8*  HCT 35.2* 32.0*  MCV 92.4 93.3  PLT 278 249      Scheduled Meds:  allopurinol  100 mg Oral Daily   apixaban  2.5 mg Oral BID   atorvastatin  10 mg Oral Daily   Chlorhexidine Gluconate Cloth  6 each Topical Daily   diltiazem  60 mg Oral TID   metoprolol tartrate  100 mg Oral BID   pantoprazole  40 mg Oral Daily   polyethylene glycol  17 g Oral Daily   spironolactone  25 mg Oral Daily   Continuous Infusions:  cefTRIAXone (ROCEPHIN)  IV 1 g (02/06/23 1203)     Assessment & Plan:   Recurrent UTI with chronic indwelling catheter Urine growing out greater than 100 K GNR--now speciated as E. coli only moderately sensitive to Zosyn and highly sensitive to ceftriaxone.  Discontinue Zosyn, start ceftriaxone day #1 today, total antibiotic day #3 Foley was changed out in ED.  Persistent cough for 2 months Chest x-ray is negative  Noncontrast chest CT today showed mild peribronchial airspace disease Patient denies history of COPD or tobacco use She may have reactive airway disease, possibly postinfectious Will try albuterol nebulizer to see if this helps her Patient has no oxygen requirement  Hyponatremia CKD 3B Hyponatremia somewhat improved with treatment of infection and hydration Patient is asymptomatic, creatinine at baseline Patient is eating and drinking well, no need for further fluid resuscitation  HTN  HFpEF Continue metoprolol and diltiazem Diuretics are being held  for now, can restart tomorrow  Atrial fibrillation Metoprolol and Cardizem for rate control Eliquis for secondary stroke prevention    DVT prophylaxis: Eliquis Code Status: Full code Family Communication: Patient's husband was at bedside throughout    Studies: CT CHEST WO CONTRAST  Result Date: 02/05/2023 CLINICAL DATA:  Chronic persistent cough. EXAM: CT CHEST WITHOUT CONTRAST TECHNIQUE: Multidetector CT imaging of the chest was performed following the standard protocol without IV contrast. RADIATION DOSE REDUCTION: This exam was performed according to the departmental dose-optimization program which includes automated exposure control, adjustment of the mA and/or kV according to patient size and/or use of iterative reconstruction technique. COMPARISON:  June 29, 2020 FINDINGS: Cardiovascular: Enlarged heart. Calcific atherosclerotic disease of the coronary arteries and aorta. No pericardial effusion. Mediastinum/Nodes: No enlarged mediastinal or axillary lymph nodes. Thyroid gland, trachea, and esophagus demonstrate no significant findings. Lungs/Pleura: Mild peribronchial airspace disease, atelectasis versus scarring in the lingula. No evidence of focal airspace consolidation or pleural effusion. Upper Abdomen: No acute abnormality. Musculoskeletal: Spondylosis of the thoracic spine. IMPRESSION: 1. Mild peribronchial airspace disease, atelectasis versus scarring in the lingula. 2. Enlarged heart. 3. Calcific atherosclerotic disease of the coronary arteries and aorta. Aortic Atherosclerosis (ICD10-I70.0). Electronically Signed   By: Adrienne [name] M.D.   On: 02/05/2023 21:44    Principal Problem:   UTI (urinary tract infection) due to urinary indwelling catheter (HCC) Active Problems:   Cough   Heart failure with preserved ejection fraction (HCC)   Permanent atrial fibrillation (HCC)   Essential hypertension   Catheter-associated urinary tract infection (HCC)     Adrienne [name]  Adrienne [name], [facility]  If 7PM-7AM, please contact night-coverage [email or web address]   LOS: 2 days

## 2023-02-06 NOTE — TOC Initial Note (Signed)
Transition of Care [facility]) - Initial/Assessment Note    Patient Details  Name: Adrienne [name] MRN: [id number] Date of Birth: [birth year]  Transition of [facility] CM/SW Contact:    Ronny Bacon, RN Phone Number: 02/06/2023, 3:14 PM  Clinical Narrative:  Spoke with patient and spouse at bedside. Patient lives at home with family. Daughter transports patient to medical appointments and other needs. Has PCP and currently using [facility] last used 1 week prior to hospital admission. Patient has following DME equipment at home; RW, Rollator, shower bench, and rail on shower door.                Expected Discharge Plan: Home w Home Health Services Barriers to Discharge: No Barriers Identified   Patient Goals and CMS Choice Patient states their goals for this hospitalization and ongoing recovery are:: To go home          Expected Discharge Plan and Services   Discharge Planning Services: CM Consult   Living arrangements for the past 2 months: Single Family Home                                      Prior Living Arrangements/Services Living arrangements for the past 2 months: Single Family Home Lives with:: Spouse Patient language and need for interpreter reviewed:: Yes Do you feel safe going back to the place where you live?: Yes      Need for Family Participation in Patient Care: Yes (Comment) Care giver support system in place?: Yes (comment) Current home services: DME, Home PT (Rollator, rolling walker, shower bench, rails on shower door. [facility]PT-[facility] currently, last seen 1 week prior to hospital admission) Criminal Activity/Legal Involvement Pertinent to Current Situation/Hospitalization: No - Comment as needed  Activities of Daily Living Home Assistive Devices/Equipment: Eyeglasses, Dan Humphreys (specify type) ADL Screening (condition at time of admission) Patient's cognitive ability adequate to safely complete daily activities?: Yes Is the patient deaf or  have difficulty hearing?: Yes Does the patient have difficulty seeing, even when wearing glasses/contacts?: No Does the patient have difficulty concentrating, remembering, or making decisions?: No Patient able to express need for assistance with ADLs?: Yes Does the patient have difficulty dressing or bathing?: No Independently performs ADLs?: Yes (appropriate for developmental age) Does the patient have difficulty walking or climbing stairs?: Yes Weakness of Legs: Both Weakness of Arms/Hands: None  Permission Sought/Granted Permission sought to share information with : Case Manager                Emotional Assessment Appearance:: Appears stated age Attitude/Demeanor/Rapport: Engaged Affect (typically observed): Appropriate Orientation: : Oriented to Self, Oriented to Place, Oriented to  Time, Oriented to Situation Alcohol / Substance Use: Not Applicable Psych Involvement: No (comment)  Admission diagnosis:  Acute cystitis without hematuria [N30.00] UTI (urinary tract infection) due to urinary indwelling catheter (HCC) [E95.284X, N39.0] Catheter-associated urinary tract infection (HCC) [L24.401U, N39.0] Patient Active Problem List   Diagnosis Date Noted   Catheter-associated urinary tract infection (HCC) 02/05/2023   UTI (urinary tract infection) due to urinary indwelling catheter (HCC) 02/04/2023   Hyponatremia 12/16/2022   AKI (acute kidney injury) (HCC) 12/16/2022   Acute cystitis 12/15/2022   Acute respiratory failure with hypoxia (HCC) 09/03/2022   CAP (community acquired pneumonia) 09/03/2022   Complicated urinary tract infection 09/03/2022   Leukocytosis 09/03/2022   Open knee wound 10/05/2021   Heart failure with preserved ejection  fraction (HCC) 08/18/2021   Palliative care by specialist    Goals of care, counseling/discussion    DNR (do not resuscitate) discussion    Vaginal bleeding 08/08/2020   Uterine prolapse 08/08/2020   Vaginal erosion secondary to  pessary use (HCC) 08/08/2020   Acute urinary retention 08/08/2020   Chronic respiratory failure (HCC) 08/08/2020   Multiple pulmonary nodules 05/24/2020   Cough 05/24/2020   Malignant neoplasm of upper-inner quadrant of left breast in female, estrogen receptor negative (HCC) 12/20/2019   Malignant neoplasm of overlapping sites of right breast in female, estrogen receptor positive (HCC) 08/14/2019   Osteopenia 08/14/2019   Diplopia 01/29/2016   GERD (gastroesophageal reflux disease) 01/29/2016   Cerebrovascular accident (CVA) due to embolism of left middle cerebral artery (HCC) 01/29/2016   Bleeding gastrointestinal    Gait disturbance, post-stroke 01/27/2016   Fall    Secondary hypertension, unspecified    Cerebral infarction due to thrombosis of left middle cerebral artery (HCC) s/p mechanical thrombectomy    Malnutrition of moderate degree 01/09/2016   GI bleed 01/06/2016   Permanent atrial fibrillation (HCC) 06/10/2015   Chronic anticoagulation 06/10/2015   Hyperlipidemia 06/10/2015   Essential hypertension 06/10/2015   History of stroke 06/10/2015   Coronary artery disease due to lipid rich plaque 06/10/2015   Elevated uric acid in blood    Arthritis of ankle    Pulmonary HTN (HCC)    PCP:  Sigmund Hazel, MD Pharmacy:   [facility] [phone] - [place],  - [address] [place] [address] Phone: [phone] Fax: [phone]     Social Determinants of Health (SDOH) Social History: SDOH Screenings   Food Insecurity: No Food Insecurity (02/04/2023)  Housing: Patient Declined (02/05/2023)  Transportation Needs: No Transportation Needs (02/04/2023)  Utilities: Not At Risk (02/05/2023)  Tobacco Use: Medium Risk (02/04/2023)   SDOH Interventions:     Readmission Risk Interventions    02/06/2023    3:10 PM 12/18/2022   11:03 AM  Readmission Risk Prevention Plan  Transportation Screening Complete  Complete  PCP or Specialist Appt within 3-5 Days  Complete  HRI or Home Care Consult  Complete  Social Work Consult for Recovery Care Planning/Counseling  Complete  Palliative Care Screening  Not Applicable  Medication Review Oceanographer) Complete Complete  PCP or Specialist appointment within 3-5 days of discharge Complete   HRI or Home Care Consult Complete   SW Recovery Care/Counseling Consult Complete   Palliative Care Screening Not Applicable   Skilled Nursing Facility Not Applicable

## 2023-02-07 DIAGNOSIS — T83511D Infection and inflammatory reaction due to indwelling urethral catheter, subsequent encounter: Secondary | ICD-10-CM | POA: Diagnosis not present

## 2023-02-07 DIAGNOSIS — N390 Urinary tract infection, site not specified: Secondary | ICD-10-CM | POA: Diagnosis not present

## 2023-02-07 LAB — CBC
HCT: 31.7 % — ABNORMAL LOW (ref 36.0–46.0)
Hemoglobin: 10.7 g/dL — ABNORMAL LOW (ref 12.0–15.0)
MCH: 31.6 pg (ref 26.0–34.0)
MCHC: 33.8 g/dL (ref 30.0–36.0)
MCV: 93.5 fL (ref 80.0–100.0)
Platelets: 269 10*3/uL (ref 150–400)
RBC: 3.39 MIL/uL — ABNORMAL LOW (ref 3.87–5.11)
RDW: 14.9 % (ref 11.5–15.5)
WBC: 10.2 10*3/uL (ref 4.0–10.5)
nRBC: 0 % (ref 0.0–0.2)

## 2023-02-07 LAB — BASIC METABOLIC PANEL
Anion gap: 7 (ref 5–15)
BUN: 23 mg/dL (ref 8–23)
CO2: 26 mmol/L (ref 22–32)
Calcium: 7.8 mg/dL — ABNORMAL LOW (ref 8.9–10.3)
Chloride: 100 mmol/L (ref 98–111)
Creatinine, Ser: 1.41 mg/dL — ABNORMAL HIGH (ref 0.44–1.00)
GFR, Estimated: 35 mL/min — ABNORMAL LOW (ref >60)
Glucose, Bld: 83 mg/dL (ref 70–99)
Potassium: 4.8 mmol/L (ref 3.5–5.1)
Sodium: 133 mmol/L — ABNORMAL LOW (ref 135–145)

## 2023-02-07 MED ORDER — GUAIFENESIN-DM 100-10 MG/5ML PO SYRP
5.0000 mL | ORAL_SOLUTION | ORAL | Status: DC | PRN
  Administered 2023-02-07: 5 mL via ORAL
  Filled 2023-02-07 (×2): qty 10

## 2023-02-07 MED ORDER — LEVOFLOXACIN 250 MG PO TABS
250.0000 mg | ORAL_TABLET | ORAL | Status: DC
  Administered 2023-02-07: 250 mg via ORAL
  Filled 2023-02-07: qty 1

## 2023-02-07 NOTE — Progress Notes (Signed)
PROGRESS NOTE    Adrienne [name]  ZOX:[id number]  DOB: 1928-05-18  DOA: 02/04/2023 PCP: Sigmund Hazel, MD Outpatient Specialists:   Hospital course:  89 year old female with history of HTN, GI bleed, atrial fibrillation, HFpEF, remote history of breast cancer, and prior CVA with chronic indwelling Foley is admitted with recurrent UTI, fever.  She grew out Pseudomonas in December and was treated with ceftriaxone and Augmentin last month for another UTI, and no clear culture.  Foley was changed out in the ED.  Patient was transferred to [facility] for possible need for ID consult given multiple antibiotics and recurrent UTIs.  5/28: Hemodynamically stable.  Patient was started on levofloxacin to see her response while in hospital so she can complete at least a 7-day course for her partially resistant UTI.  Apparently she received Augmentin and Zosyn before and her current E. coli shows resistance to Augmentin and intermediate resistance to Zosyn.  Multiple drug allergies which include Cipro was listed with very vague responses in her chart. Received a dose of levofloxacin this morning and if remained stable can be discharged tomorrow on a 7-day course of levofloxacin.  Subjective: Patient was seen and examined today.  She has a lot of questions regarding her antibiotics but agrees to try levofloxacin so she can go home tomorrow, otherwise has to stay in hospital for another 5 days to complete IV antibiotic with ceftriaxone.  Objective: Vitals:   02/06/23 1515 02/06/23 1950 02/07/23 0521 02/07/23 0739  BP: (!) 133/53 (!) 142/60 (!) 141/64 (!) 144/61  Pulse: 61 67 64 76  Resp: 18 18 18    Temp: 98.2 F (36.8 C) 98.1 F (36.7 C) 97.8 F (36.6 C) 97.6 F (36.4 C)  TempSrc: Oral Oral Oral Oral  SpO2: 97% 98% 97% 95%  Weight:      Height:        Intake/Output Summary (Last 24 hours) at 02/07/2023 1412 Last data filed at 02/07/2023 1210 Gross per 24 hour  Intake 340 ml  Output  2150 ml  Net -1810 ml    Filed Weights   02/04/23 2341  Weight: 54.9 kg     Exam:  General.  Frail elderly lady, in no acute distress. Pulmonary.  Lungs clear bilaterally, normal respiratory effort. CV.  Regular rate and rhythm, no JVD, rub or murmur. Abdomen.  Soft, nontender, nondistended, BS positive. CNS.  Alert and oriented .  No focal neurologic deficit. Extremities.  No edema, no cyanosis, pulses intact and symmetrical. Psychiatry.  Judgment and insight appears normal.   Data Reviewed:  Basic Metabolic Panel: Recent Labs  Lab 02/04/23 1619 02/05/23 0713 02/07/23 0315  NA 131* 133* 133*  K 4.5 4.2 4.8  CL 94* 100 100  CO2 28 26 26   GLUCOSE 113* 101* 83  BUN 29* 24* 23  CREATININE 1.38* 1.35* 1.41*  CALCIUM 9.6 8.5* 7.8*     CBC: Recent Labs  Lab 02/04/23 1619 02/05/23 0713 02/07/23 0315  WBC 14.2* 13.5* 10.2  NEUTROABS 11.8*  --   --   HGB 12.0 10.8* 10.7*  HCT 35.2* 32.0* 31.7*  MCV 92.4 93.3 93.5  PLT 278 249 269      Scheduled Meds:  allopurinol  100 mg Oral Daily   apixaban  2.5 mg Oral BID   atorvastatin  10 mg Oral Daily   Chlorhexidine Gluconate Cloth  6 each Topical Daily   diltiazem  60 mg Oral TID   levofloxacin  250 mg Oral Q48H  metoprolol tartrate  100 mg Oral BID   pantoprazole  40 mg Oral Daily   polyethylene glycol  17 g Oral Daily   spironolactone  25 mg Oral Daily   Continuous Infusions:     Assessment & Plan:   Recurrent UTI with chronic indwelling catheter Urine growing out greater than 100 K GNR--now speciated as E. coli only moderately sensitive to Zosyn and highly sensitive to ceftriaxone. Discontinue Zosyn, and she was started on ceftriaxone, received 2 doses, antibiotic is switched with levofloxacin based on sensitivity results and if does not develop any allergic reaction-can be discharged tomorrow on 7-day course of levofloxacin. Foley was changed out in ED.  Persistent cough for 2 months Chest x-ray is  negative  Noncontrast chest CT today showed mild peribronchial airspace disease Patient denies history of COPD or tobacco use She may have reactive airway disease, possibly postinfectious Will try albuterol nebulizer to see if this helps her Patient has no oxygen requirement  Hyponatremia CKD 3B Hyponatremia somewhat improved with treatment of infection and hydration Patient is asymptomatic, creatinine at baseline Patient is eating and drinking well, no need for further fluid resuscitation  HTN  HFpEF Continue metoprolol and diltiazem Diuretics are being held for now, can restart tomorrow  Atrial fibrillation Metoprolol and Cardizem for rate control Eliquis for secondary stroke prevention    DVT prophylaxis: Eliquis Code Status: Full code Family Communication: Patient's husband was at bedside throughout    Studies: CT CHEST WO CONTRAST  Result Date: 02/05/2023 CLINICAL DATA:  Chronic persistent cough. EXAM: CT CHEST WITHOUT CONTRAST TECHNIQUE: Multidetector CT imaging of the chest was performed following the standard protocol without IV contrast. RADIATION DOSE REDUCTION: This exam was performed according to the departmental dose-optimization program which includes automated exposure control, adjustment of the mA and/or kV according to patient size and/or use of iterative reconstruction technique. COMPARISON:  June 29, 2020 FINDINGS: Cardiovascular: Enlarged heart. Calcific atherosclerotic disease of the coronary arteries and aorta. No pericardial effusion. Mediastinum/Nodes: No enlarged mediastinal or axillary lymph nodes. Thyroid gland, trachea, and esophagus demonstrate no significant findings. Lungs/Pleura: Mild peribronchial airspace disease, atelectasis versus scarring in the lingula. No evidence of focal airspace consolidation or pleural effusion. Upper Abdomen: No acute abnormality. Musculoskeletal: Spondylosis of the thoracic spine. IMPRESSION: 1. Mild peribronchial  airspace disease, atelectasis versus scarring in the lingula. 2. Enlarged heart. 3. Calcific atherosclerotic disease of the coronary arteries and aorta. Aortic Atherosclerosis (ICD10-I70.0). Electronically Signed   By: Ted Mcalpine M.D.   On: 02/05/2023 21:44    Principal Problem:   UTI (urinary tract infection) due to urinary indwelling catheter (HCC) Active Problems:   Cough   Heart failure with preserved ejection fraction (HCC)   Permanent atrial fibrillation (HCC)   Essential hypertension   Catheter-associated urinary tract infection (HCC)     Arnetha Courser, MD [facility]  If 7PM-7AM, please contact night-coverage [email or web address]   LOS: 2 days

## 2023-02-07 NOTE — Progress Notes (Signed)
Pt with multidrug resistance e.coli in urine. Limited PO options due to resistance. D/w Dr. Nelson Chimes and we will try levofloxacin here and monitor for reaction to complete 7d of therapy.   Levofloxacin 250mg  PO [address] PharmD, [place], AAHIVP, CPP Infectious Disease Pharmacist 02/07/2023 9:06 AM

## 2023-02-07 NOTE — Care Management Important Message (Signed)
Important Message  Patient Details  Name: Adrienne [name] MRN: [id number] Date of Birth: [birth year]   Medicare Important Message Given:  Yes     [name] 02/07/2023, 3:30 PM

## 2023-02-08 DIAGNOSIS — T83511D Infection and inflammatory reaction due to indwelling urethral catheter, subsequent encounter: Secondary | ICD-10-CM | POA: Diagnosis not present

## 2023-02-08 DIAGNOSIS — N390 Urinary tract infection, site not specified: Secondary | ICD-10-CM | POA: Diagnosis not present

## 2023-02-08 MED ORDER — MELATONIN 5 MG PO TABS
10.0000 mg | ORAL_TABLET | Freq: Every evening | ORAL | Status: DC | PRN
  Administered 2023-02-08: 10 mg via ORAL
  Filled 2023-02-08: qty 2

## 2023-02-08 MED ORDER — LEVOFLOXACIN 250 MG PO TABS: 250.0000 mg | ORAL_TABLET | ORAL | 0 refills | Status: AC

## 2023-02-08 NOTE — TOC Transition Note (Signed)
Transition of Care [facility]) - CM/SW Discharge Note   Patient Details  Name: Adrienne [name] MRN: [id number] Date of Birth: Dec 21, 1927  [facility] CM/SW Contact:  Janae Bridgeman, RN Phone Number: 02/08/2023, 3:54 PM   Clinical Narrative:    CM spoke with the bedside nurse and patient is being discharged home today with family.  The patient was active with [facility]  New Home health orders were placed - to be co-signed by the attending MD.  Frances [facility] was notified that patient was discharging home today so they can restart services at the home.     Barriers to Discharge: No Barriers Identified   Patient Goals and CMS Choice      Discharge Placement                         Discharge Plan and Services Additional resources added to the After Visit Summary for     Discharge Planning Services: CM Consult                                 Social Determinants of Health (SDOH) Interventions SDOH Screenings   Food Insecurity: No Food Insecurity (02/04/2023)  Housing: Patient Declined (02/05/2023)  Transportation Needs: No Transportation Needs (02/04/2023)  Utilities: Not At Risk (02/05/2023)  Tobacco Use: Medium Risk (02/04/2023)     Readmission Risk Interventions    02/06/2023    3:10 PM 12/18/2022   11:03 AM  Readmission Risk Prevention Plan  Transportation Screening Complete Complete  PCP or Specialist Appt within 3-5 Days  Complete  HRI or Home Care Consult  Complete  Social Work Consult for Recovery Care Planning/Counseling  Complete  Palliative Care Screening  Not Applicable  Medication Review [employer]) Complete Complete  PCP or Specialist appointment within 3-5 days of discharge Complete   HRI or Home Care Consult Complete   SW Recovery Care/Counseling Consult Complete   Palliative Care Screening Not Applicable   Skilled Nursing Facility Not Applicable

## 2023-02-08 NOTE — Progress Notes (Signed)
PT Cancellation Note  Patient Details Name: Adrienne [name] MRN: [id number] DOB: Mar 10, 1928   Cancelled Treatment:    Reason Eval/Treat Not Completed: PT screened, no needs identified, will sign off PT orders received, chart reviewed. Pt received in bed with husband present. Pt reports she's anticipating d/c today & has been ambulating to/from bathroom in room with RW without issue. Pt reports she is at baseline/CLOF & is already set up with HHPT services and politely declines PT evaluation. PT to complete current orders, please re-consult if new needs arise.  Aleda Grana, PT, DPT 02/08/23, 3:04 PM   Sandi Mariscal 02/08/2023, 3:03 PM

## 2023-02-08 NOTE — Discharge Summary (Signed)
Physician Discharge Summary  [name] ZOX:[id number] DOB: 08/20/1928 DOA: 02/04/2023  PCP: Sigmund Hazel, MD  Admit date: 02/04/2023 Discharge date: 02/08/2023  Admitted From: Home Discharge disposition: Home  Recommendations at discharge:  Continue levofloxacin to complete 7-day course with probiotics Because of your impaired kidney function, I have stopped spironolactone.  Okay to continue Lasix.  Monitor your fluid intake, volume status.  Follow-up with primary care provider as an outpatient  Brief narrative: Adrienne [name] is a 89 y.o. female with PMH significant for HTN, HLD, A-fib, diastolic CHF, prior CVA, GI bleeding, remote history of breast cancer, chronic indwelling Foley catheter and recurrent UTIs 5/25, patient was brought to the ED with complaint of fever, dysuria In December 2023, she was treated for Pseudomonas UTI.  Last month, she was treated with another episode of UTI with Augmentin, no clear culture data.  In the ED, Foley catheter changed. Admitted to [facility] Previous hospitalist discussed with ID Initially started on IV Zosyn Urine culture grew E. coli based on which antibiotic was switched to oral Levaquin.  Subjective: Patient was seen and examined this afternoon.  Pleasant elderly Caucasian female.  Lying on bed.  Not in distress no new symptoms.  Husband at bedside.  Assessment and plan: Recurrent UTI associated with chronic indwelling catheter Currently improving on oral Levaquin.  Plan to complete 7-day course was discharged. Foley was changed out in ED.   Persistent cough for 2 months Chest x-ray is negative  Noncontrast chest CT showed mild peribronchial airspace disease Patient denies history of COPD or tobacco use She may have reactive airway disease, possibly postinfectious Continue as needed bronchodilators.  Not on supplemental oxygen.   Atrial fibrillation Metoprolol and Cardizem for rate control Eliquis for  secondary stroke prevention  HTN  HFpEF PTA on metoprolol and Cardizem as well as Lasix 20 mg daily and Aldactone 25 mg daily Patient clinically looks euvolemic.  Labs reviewed.  It seems patient has persistent elevation in creatinine since Aldactone was added in April.  I will stop Aldactone at this time.  Okay to continue Lasix 20 mg daily.  I have instructed her to watch her weight, volume status and follow-up with PCP.   CKD 3B Creatinine at baseline.  Encouraged to improve hydration and oral intake. Recent Labs    09/07/22 0324 09/08/22 0150 09/09/22 0655 12/15/22 1855 12/16/22 0917 12/17/22 0405 12/18/22 0323 02/04/23 1619 02/05/23 0713 02/07/23 0315  BUN 22 18 16  45* 37* 37* 29* 29* 24* 23  CREATININE 0.94 0.99 0.91 1.73* 1.46* 1.52* 1.15* 1.38* 1.35* 1.41*   Mild hyponatremia Probably due to poor oral intake and dehydration. Recent Labs  Lab 02/04/23 1619 02/05/23 0713 02/07/23 0315  NA 131* 133* 133*    Hyperlipidemia Lipitor  Gout Allopurinol  GERD PPI  Wounds:  -    Discharge Exam:   Vitals:   02/07/23 1604 02/07/23 1959 02/08/23 0410 02/08/23 0806  BP: (!) 130/57 (!) 146/66 (!) 139/57 (!) 143/65  Pulse: 61 67 61 63  Resp:  20 18 17   Temp: 97.9 F (36.6 C) 97.6 F (36.4 C) 97.6 F (36.4 C) 98.1 F (36.7 C)  TempSrc: Oral Oral Oral   SpO2: 95% 99% 96% 96%  Weight:      Height:        Body mass index is 24.44 kg/m.  General exam: Pleasant, elderly Caucasian female.  Not in physical distress Skin: No rashes, lesions or ulcers. HEENT: Atraumatic, normocephalic, no obvious bleeding Lungs:  Clear to auscultation bilaterally CVS: Regular rate and rhythm, no murmur GI/Abd soft, nontender, nondistended, bowel sound present CNS: Alert, awake, oriented x 3 Psychiatry: Mood appropriate Extremities: No pedal edema, no calf tenderness  Follow ups:    Follow-up Information     [facility] Follow up.   Specialty: Home Health  Services Why: [facility] will continue to provide home health services.  They will call you to set up services in the next 24-48 hours. Contact information: [address] [place] [address] [phone]         Sigmund Hazel, MD Follow up.   Specialty: Family Medicine Contact information: [address] [place] [address] [phone]                 Discharge Instructions:   Discharge Instructions     Call MD for:  difficulty breathing, headache or visual disturbances   Complete by: As directed    Call MD for:  extreme fatigue   Complete by: As directed    Call MD for:  hives   Complete by: As directed    Call MD for:  persistant dizziness or light-headedness   Complete by: As directed    Call MD for:  persistant nausea and vomiting   Complete by: As directed    Call MD for:  severe uncontrolled pain   Complete by: As directed    Call MD for:  temperature >100.4   Complete by: As directed    Diet general   Complete by: As directed    Discharge instructions   Complete by: As directed    Recommendations at discharge:   Continue levofloxacin to complete 7-day course with probiotics  Because of your impaired kidney function, I have stopped spironolactone.  Okay to continue Lasix.  Monitor your fluid intake, volume status.   Follow-up with primary care provider as an outpatient  General discharge instructions: Follow with Primary MD Sigmund Hazel, MD in 7 days  Please request your PCP  to go over your hospital tests, procedures, radiology results at the follow up. Please get your medicines reviewed and adjusted.  Your PCP may decide to repeat certain labs or tests as needed. Do not drive, operate heavy machinery, perform activities at heights, swimming or participation in water activities or provide baby sitting services if your were admitted for syncope or siezures until you have seen by Primary MD or a Neurologist and advised to do so  again. [place] Controlled Substance Reporting System database was reviewed. Do not drive, operate heavy machinery, perform activities at heights, swim, participate in water activities or provide baby-sitting services while on medications for pain, sleep and mood until your outpatient physician has reevaluated you and advised to do so again.  You are strongly recommended to comply with the dose, frequency and duration of prescribed medications. Activity: As tolerated with Full fall precautions use walker/cane & assistance as needed Avoid using any recreational substances like cigarette, tobacco, alcohol, or non-prescribed drug. If you experience worsening of your admission symptoms, develop shortness of breath, life threatening emergency, suicidal or homicidal thoughts you must seek medical attention immediately by calling 911 or calling your MD immediately  if symptoms less severe. You must read complete instructions/literature along with all the possible adverse reactions/side effects for all the medicines you take and that have been prescribed to you. Take any new medicine only after you have completely understood and accepted all the possible adverse reactions/side effects.  Wear Seat belts while driving. You were cared for by a hospitalist during your hospital stay. If you have any questions about your discharge medications or the care you received while you were in the hospital after you are discharged, you can call the unit and ask to speak with the hospitalist or the covering physician. Once you are discharged, your primary care physician will handle any further medical issues. Please note that NO REFILLS for any discharge medications will be authorized once you are discharged, as it is imperative that you return to your primary care physician (or establish a relationship with a primary care physician if you do not have one).   Increase activity slowly   Complete by: As directed         Discharge Medications:   Allergies as of 02/08/2023       Reactions   Valium [diazepam] Other (See Comments)   HYPERACTIVITY   Amiodarone Hcl [amiodarone] Other (See Comments)   A-Fib   Compazine [prochlorperazine] Other (See Comments)   HYPERACTIVITY    Other Other (See Comments)   "Kidney dye"--patient states this gave her severe shakes/chills   Thorazine [chlorpromazine] Other (See Comments)   HYPERACTIVITY    Zometa [zoledronic Acid] Other (See Comments)   PROBLEMS WITH EYES TIA   Ciprofloxacin Swelling, Other (See Comments)   "swelling" in Rt.elbow   Doxycycline Nausea Only   Zoster Vac Recomb Adjuvanted Other (See Comments)   Welt under injection site   Iodinated Contrast Media Anxiety, Other (See Comments)   Chills and anxiety Other Reaction(s): chills/shaking, Kidney dye        Medication List     STOP taking these medications    spironolactone 25 MG tablet Commonly known as: ALDACTONE       TAKE these medications    acetaminophen 500 MG tablet Commonly known as: TYLENOL Take 500-1,000 mg by mouth every 12 (twelve) hours as needed (pain).   albuterol 108 (90 Base) MCG/ACT inhaler Commonly known as: Ventolin HFA Inhale 1 puff into the lungs every 6 (six) hours as needed for wheezing or shortness of breath (Cough).   allopurinol 100 MG tablet Commonly known as: ZYLOPRIM Take 1 tablet (100 mg total) by mouth daily.   apixaban 2.5 MG Tabs tablet Commonly known as: ELIQUIS Take 1 tablet (2.5 mg total) by mouth 2 (two) times daily.   ascorbic acid 500 MG tablet Commonly known as: VITAMIN C Take 500 mg by mouth 2 (two) times daily.   atorvastatin 10 MG tablet Commonly known as: LIPITOR TAKE 1 TABLET DAILY   calcium carbonate 1500 (600 Ca) MG Tabs tablet Commonly known as: OSCAL Take 1,500 mg by mouth 2 (two) times daily with a meal.   diltiazem 60 MG tablet Commonly known as: CARDIZEM TAKE 1 TABLET THREE TIMES A DAY   docusate sodium  100 MG capsule Commonly known as: COLACE Take 100 mg by mouth 2 (two) times daily.   furosemide 20 MG tablet Commonly known as: LASIX Take 1 tablet (20 mg total) by mouth daily.   levofloxacin 250 MG tablet Commonly known as: LEVAQUIN Take 1 tablet (250 mg total) by mouth every other day for 4 days. Start taking on: Feb 09, 2023   metoprolol tartrate 100 MG tablet Commonly known as: LOPRESSOR TAKE 1 TABLET TWICE A DAY   multivitamin with minerals tablet Take 1 tablet by mouth daily.   omeprazole 10 MG capsule Commonly known as: PRILOSEC Take 10 mg by mouth every other day.  polyethylene glycol 17 g packet Commonly known as: MIRALAX / GLYCOLAX Take 17 g by mouth daily.   UP4 PROBIOTICS WOMENS PO Take 1 capsule by mouth daily with breakfast.   Vitamin B Complex Caps Take 1 capsule by mouth daily.         The results of significant diagnostics from this hospitalization (including imaging, microbiology, ancillary and laboratory) are listed below for reference.    Procedures and Diagnostic Studies:   CT CHEST WO CONTRAST  Result Date: 02/05/2023 CLINICAL DATA:  Chronic persistent cough. EXAM: CT CHEST WITHOUT CONTRAST TECHNIQUE: Multidetector CT imaging of the chest was performed following the standard protocol without IV contrast. RADIATION DOSE REDUCTION: This exam was performed according to the departmental dose-optimization program which includes automated exposure control, adjustment of the mA and/or kV according to patient size and/or use of iterative reconstruction technique. COMPARISON:  June 29, 2020 FINDINGS: Cardiovascular: Enlarged heart. Calcific atherosclerotic disease of the coronary arteries and aorta. No pericardial effusion. Mediastinum/Nodes: No enlarged mediastinal or axillary lymph nodes. Thyroid gland, trachea, and esophagus demonstrate no significant findings. Lungs/Pleura: Mild peribronchial airspace disease, atelectasis versus scarring in the  lingula. No evidence of focal airspace consolidation or pleural effusion. Upper Abdomen: No acute abnormality. Musculoskeletal: Spondylosis of the thoracic spine. IMPRESSION: 1. Mild peribronchial airspace disease, atelectasis versus scarring in the lingula. 2. Enlarged heart. 3. Calcific atherosclerotic disease of the coronary arteries and aorta. Aortic Atherosclerosis (ICD10-I70.0). Electronically Signed   By: Ted Mcalpine M.D.   On: 02/05/2023 21:44   DG Chest Port 1 View  Result Date: 02/04/2023 CLINICAL DATA:  Questionable sepsis. EXAM: PORTABLE CHEST 1 VIEW COMPARISON:  Jan 20, 2023 FINDINGS: Tortuosity and calcific atherosclerotic disease of the aorta. Cardiomediastinal silhouette is enlarged. Mediastinal contours appear intact. There is no evidence of focal airspace consolidation, pleural effusion or pneumothorax. Osseous structures are without acute abnormality. Soft tissues are grossly normal. IMPRESSION: No active disease. Electronically Signed   By: Ted Mcalpine M.D.   On: 02/04/2023 16:53     Labs:   Basic Metabolic Panel: Recent Labs  Lab 02/04/23 1619 02/05/23 0713 02/07/23 0315  NA 131* 133* 133*  K 4.5 4.2 4.8  CL 94* 100 100  CO2 28 26 26   GLUCOSE 113* 101* 83  BUN 29* 24* 23  CREATININE 1.38* 1.35* 1.41*  CALCIUM 9.6 8.5* 7.8*   GFR Estimated Creatinine Clearance: 18.8 mL/min (A) (by C-G formula based on SCr of 1.41 mg/dL (H)). Liver Function Tests: Recent Labs  Lab 02/04/23 1619  AST 12*  ALT 10  ALKPHOS 74  BILITOT 1.1  PROT 7.5  ALBUMIN 3.8   No results for input(s): "LIPASE", "AMYLASE" in the last 168 hours. No results for input(s): "AMMONIA" in the last 168 hours. Coagulation profile Recent Labs  Lab 02/04/23 1619  INR 1.4*    CBC: Recent Labs  Lab 02/04/23 1619 02/05/23 0713 02/07/23 0315  WBC 14.2* 13.5* 10.2  NEUTROABS 11.8*  --   --   HGB 12.0 10.8* 10.7*  HCT 35.2* 32.0* 31.7*  MCV 92.4 93.3 93.5  PLT 278 249 269    Cardiac Enzymes: No results for input(s): "CKTOTAL", "CKMB", "CKMBINDEX", "TROPONINI" in the last 168 hours. BNP: Invalid input(s): "POCBNP" CBG: No results for input(s): "GLUCAP" in the last 168 hours. D-Dimer No results for input(s): "DDIMER" in the last 72 hours. Hgb A1c No results for input(s): "HGBA1C" in the last 72 hours. Lipid Profile No results for input(s): "CHOL", "HDL", "LDLCALC", "TRIG", "CHOLHDL", "LDLDIRECT"  in the last 72 hours. Thyroid function studies No results for input(s): "TSH", "T4TOTAL", "T3FREE", "THYROIDAB" in the last 72 hours.  Invalid input(s): "FREET3" Anemia work up No results for input(s): "VITAMINB12", "FOLATE", "FERRITIN", "TIBC", "IRON", "RETICCTPCT" in the last 72 hours. Microbiology Recent Results (from the past 240 hour(s))  Resp panel by RT-PCR (RSV, Flu A&B, Covid) Anterior Nasal Swab     Status: None   Collection Time: 02/04/23  3:05 PM   Specimen: Anterior Nasal Swab  Result Value Ref Range Status   SARS Coronavirus 2 by RT PCR NEGATIVE NEGATIVE Final    Comment: (NOTE) SARS-CoV-2 target nucleic acids are NOT DETECTED.  The SARS-CoV-2 RNA is generally detectable in upper respiratory specimens during the acute phase of infection. The lowest concentration of SARS-CoV-2 viral copies this assay can detect is 138 copies/mL. A negative result does not preclude SARS-Cov-2 infection and should not be used as the sole basis for treatment or other patient management decisions. A negative result may occur with  improper specimen collection/handling, submission of specimen other than nasopharyngeal swab, presence of viral mutation(s) within the areas targeted by this assay, and inadequate number of viral copies(<138 copies/mL). A negative result must be combined with clinical observations, patient history, and epidemiological information. The expected result is Negative.  Fact Sheet for Patients:   BloggerCourse.com  Fact Sheet for Healthcare Providers:  SeriousBroker.it  This test is no t yet approved or cleared by the [place] FDA and  has been authorized for detection and/or diagnosis of SARS-CoV-2 by FDA under an Emergency Use Authorization (EUA). This EUA will remain  in effect (meaning this test can be used) for the duration of the COVID-19 declaration under Section 564(b)(1) of the Act, 21 U.S.C.section 360bbb-3(b)(1), unless the authorization is terminated  or revoked sooner.       Influenza A by PCR NEGATIVE NEGATIVE Final   Influenza B by PCR NEGATIVE NEGATIVE Final    Comment: (NOTE) The Xpert Xpress SARS-CoV-2/FLU/RSV plus assay is intended as an aid in the diagnosis of influenza from Nasopharyngeal swab specimens and should not be used as a sole basis for treatment. Nasal washings and aspirates are unacceptable for Xpert Xpress SARS-CoV-2/FLU/RSV testing.  Fact Sheet for Patients: BloggerCourse.com  Fact Sheet for Healthcare Providers: SeriousBroker.it  This test is not yet approved or cleared by the [place] FDA and has been authorized for detection and/or diagnosis of SARS-CoV-2 by FDA under an Emergency Use Authorization (EUA). This EUA will remain in effect (meaning this test can be used) for the duration of the COVID-19 declaration under Section 564(b)(1) of the Act, 21 U.S.C. section 360bbb-3(b)(1), unless the authorization is terminated or revoked.     Resp Syncytial Virus by PCR NEGATIVE NEGATIVE Final    Comment: (NOTE) Fact Sheet for Patients: BloggerCourse.com  Fact Sheet for Healthcare Providers: SeriousBroker.it  This test is not yet approved or cleared by the [place] FDA and has been authorized for detection and/or diagnosis of SARS-CoV-2 by FDA under an Emergency Use  Authorization (EUA). This EUA will remain in effect (meaning this test can be used) for the duration of the COVID-19 declaration under Section 564(b)(1) of the Act, 21 U.S.C. section 360bbb-3(b)(1), unless the authorization is terminated or revoked.  Performed at [facility], [address], [place], [place] [address]   Remove and replace urinary cath (placed > 5 days) then obtain urine culture from new indwelling urinary catheter.     Status: Abnormal   Collection Time:  02/04/23  4:49 PM   Specimen: Urine, Catheterized  Result Value Ref Range Status   Specimen Description   Final    URINE, CATHETERIZED Performed at [facility], [address], [place], [place] [address]    Special Requests   Final    NONE Performed at [facility], [address], [place], [place] [address]    Culture >=100,000 COLONIES/mL ESCHERICHIA COLI (A)  Final   Report Status 02/06/2023 FINAL  Final   Organism ID, Bacteria ESCHERICHIA COLI (A)  Final      Susceptibility   Escherichia coli - MIC*    AMPICILLIN >=32 RESISTANT Resistant     CEFAZOLIN >=64 RESISTANT Resistant     CEFEPIME <=0.12 SENSITIVE Sensitive     CEFTRIAXONE <=0.25 SENSITIVE Sensitive     CIPROFLOXACIN <=0.25 SENSITIVE Sensitive     GENTAMICIN 2 SENSITIVE Sensitive     IMIPENEM 0.5 SENSITIVE Sensitive     NITROFURANTOIN 32 SENSITIVE Sensitive     TRIMETH/SULFA >=320 RESISTANT Resistant     AMPICILLIN/SULBACTAM >=32 RESISTANT Resistant     PIP/TAZO 64 INTERMEDIATE Intermediate     * >=100,000 COLONIES/mL ESCHERICHIA COLI    Time coordinating discharge: 45 minutes  Signed: Keya Wynes  [facility] 02/08/2023, 3:01 PM

## 2023-02-08 NOTE — Discharge Instructions (Signed)

## 2023-02-16 DIAGNOSIS — I447 Left bundle-branch block, unspecified: Secondary | ICD-10-CM | POA: Diagnosis not present

## 2023-02-16 DIAGNOSIS — I130 Hypertensive heart and chronic kidney disease with heart failure and stage 1 through stage 4 chronic kidney disease, or unspecified chronic kidney disease: Secondary | ICD-10-CM | POA: Diagnosis not present

## 2023-02-16 DIAGNOSIS — N189 Chronic kidney disease, unspecified: Secondary | ICD-10-CM | POA: Diagnosis not present

## 2023-02-16 DIAGNOSIS — I5032 Chronic diastolic (congestive) heart failure: Secondary | ICD-10-CM | POA: Diagnosis not present

## 2023-02-16 DIAGNOSIS — I4821 Permanent atrial fibrillation: Secondary | ICD-10-CM | POA: Diagnosis not present

## 2023-02-16 DIAGNOSIS — C50911 Malignant neoplasm of unspecified site of right female breast: Secondary | ICD-10-CM | POA: Diagnosis not present

## 2023-02-20 DIAGNOSIS — Z978 Presence of other specified devices: Secondary | ICD-10-CM | POA: Diagnosis not present

## 2023-02-20 DIAGNOSIS — N390 Urinary tract infection, site not specified: Secondary | ICD-10-CM | POA: Diagnosis not present

## 2023-02-20 DIAGNOSIS — Z6827 Body mass index (BMI) 27.0-27.9, adult: Secondary | ICD-10-CM | POA: Diagnosis not present

## 2023-02-20 DIAGNOSIS — N1832 Chronic kidney disease, stage 3b: Secondary | ICD-10-CM | POA: Diagnosis not present

## 2023-02-23 DIAGNOSIS — I130 Hypertensive heart and chronic kidney disease with heart failure and stage 1 through stage 4 chronic kidney disease, or unspecified chronic kidney disease: Secondary | ICD-10-CM | POA: Diagnosis not present

## 2023-02-23 DIAGNOSIS — I5032 Chronic diastolic (congestive) heart failure: Secondary | ICD-10-CM | POA: Diagnosis not present

## 2023-02-23 DIAGNOSIS — N189 Chronic kidney disease, unspecified: Secondary | ICD-10-CM | POA: Diagnosis not present

## 2023-02-23 DIAGNOSIS — I447 Left bundle-branch block, unspecified: Secondary | ICD-10-CM | POA: Diagnosis not present

## 2023-02-23 DIAGNOSIS — I4821 Permanent atrial fibrillation: Secondary | ICD-10-CM | POA: Diagnosis not present

## 2023-02-23 DIAGNOSIS — C50911 Malignant neoplasm of unspecified site of right female breast: Secondary | ICD-10-CM | POA: Diagnosis not present

## 2023-02-25 DIAGNOSIS — Z7901 Long term (current) use of anticoagulants: Secondary | ICD-10-CM | POA: Diagnosis not present

## 2023-02-25 DIAGNOSIS — I5032 Chronic diastolic (congestive) heart failure: Secondary | ICD-10-CM | POA: Diagnosis not present

## 2023-02-25 DIAGNOSIS — C50911 Malignant neoplasm of unspecified site of right female breast: Secondary | ICD-10-CM | POA: Diagnosis not present

## 2023-02-25 DIAGNOSIS — Z8701 Personal history of pneumonia (recurrent): Secondary | ICD-10-CM | POA: Diagnosis not present

## 2023-02-25 DIAGNOSIS — Z87440 Personal history of urinary (tract) infections: Secondary | ICD-10-CM | POA: Diagnosis not present

## 2023-02-25 DIAGNOSIS — E785 Hyperlipidemia, unspecified: Secondary | ICD-10-CM | POA: Diagnosis not present

## 2023-02-25 DIAGNOSIS — Z8673 Personal history of transient ischemic attack (TIA), and cerebral infarction without residual deficits: Secondary | ICD-10-CM | POA: Diagnosis not present

## 2023-02-25 DIAGNOSIS — I083 Combined rheumatic disorders of mitral, aortic and tricuspid valves: Secondary | ICD-10-CM | POA: Diagnosis not present

## 2023-02-25 DIAGNOSIS — I447 Left bundle-branch block, unspecified: Secondary | ICD-10-CM | POA: Diagnosis not present

## 2023-02-25 DIAGNOSIS — N1832 Chronic kidney disease, stage 3b: Secondary | ICD-10-CM | POA: Diagnosis not present

## 2023-02-25 DIAGNOSIS — K219 Gastro-esophageal reflux disease without esophagitis: Secondary | ICD-10-CM | POA: Diagnosis not present

## 2023-02-25 DIAGNOSIS — I4821 Permanent atrial fibrillation: Secondary | ICD-10-CM | POA: Diagnosis not present

## 2023-02-25 DIAGNOSIS — M109 Gout, unspecified: Secondary | ICD-10-CM | POA: Diagnosis not present

## 2023-02-25 DIAGNOSIS — I272 Pulmonary hypertension, unspecified: Secondary | ICD-10-CM | POA: Diagnosis not present

## 2023-02-25 DIAGNOSIS — Z87891 Personal history of nicotine dependence: Secondary | ICD-10-CM | POA: Diagnosis not present

## 2023-02-25 DIAGNOSIS — I2583 Coronary atherosclerosis due to lipid rich plaque: Secondary | ICD-10-CM | POA: Diagnosis not present

## 2023-02-25 DIAGNOSIS — Z9011 Acquired absence of right breast and nipple: Secondary | ICD-10-CM | POA: Diagnosis not present

## 2023-02-25 DIAGNOSIS — I130 Hypertensive heart and chronic kidney disease with heart failure and stage 1 through stage 4 chronic kidney disease, or unspecified chronic kidney disease: Secondary | ICD-10-CM | POA: Diagnosis not present

## 2023-02-25 DIAGNOSIS — N814 Uterovaginal prolapse, unspecified: Secondary | ICD-10-CM | POA: Diagnosis not present

## 2023-02-25 DIAGNOSIS — Z466 Encounter for fitting and adjustment of urinary device: Secondary | ICD-10-CM | POA: Diagnosis not present

## 2023-02-25 DIAGNOSIS — I2510 Atherosclerotic heart disease of native coronary artery without angina pectoris: Secondary | ICD-10-CM | POA: Diagnosis not present

## 2023-02-25 DIAGNOSIS — Z96653 Presence of artificial knee joint, bilateral: Secondary | ICD-10-CM | POA: Diagnosis not present

## 2023-02-25 DIAGNOSIS — M19079 Primary osteoarthritis, unspecified ankle and foot: Secondary | ICD-10-CM | POA: Diagnosis not present

## 2023-02-25 DIAGNOSIS — I700 Atherosclerosis of aorta: Secondary | ICD-10-CM | POA: Diagnosis not present

## 2023-02-25 DIAGNOSIS — K5790 Diverticulosis of intestine, part unspecified, without perforation or abscess without bleeding: Secondary | ICD-10-CM | POA: Diagnosis not present

## 2023-03-02 DIAGNOSIS — I447 Left bundle-branch block, unspecified: Secondary | ICD-10-CM | POA: Diagnosis not present

## 2023-03-02 DIAGNOSIS — I5032 Chronic diastolic (congestive) heart failure: Secondary | ICD-10-CM | POA: Diagnosis not present

## 2023-03-02 DIAGNOSIS — I4821 Permanent atrial fibrillation: Secondary | ICD-10-CM | POA: Diagnosis not present

## 2023-03-02 DIAGNOSIS — I130 Hypertensive heart and chronic kidney disease with heart failure and stage 1 through stage 4 chronic kidney disease, or unspecified chronic kidney disease: Secondary | ICD-10-CM | POA: Diagnosis not present

## 2023-03-02 DIAGNOSIS — N1832 Chronic kidney disease, stage 3b: Secondary | ICD-10-CM | POA: Diagnosis not present

## 2023-03-02 DIAGNOSIS — C50911 Malignant neoplasm of unspecified site of right female breast: Secondary | ICD-10-CM | POA: Diagnosis not present

## 2023-03-08 DIAGNOSIS — I130 Hypertensive heart and chronic kidney disease with heart failure and stage 1 through stage 4 chronic kidney disease, or unspecified chronic kidney disease: Secondary | ICD-10-CM | POA: Diagnosis not present

## 2023-03-08 DIAGNOSIS — I5032 Chronic diastolic (congestive) heart failure: Secondary | ICD-10-CM | POA: Diagnosis not present

## 2023-03-08 DIAGNOSIS — I447 Left bundle-branch block, unspecified: Secondary | ICD-10-CM | POA: Diagnosis not present

## 2023-03-08 DIAGNOSIS — N1832 Chronic kidney disease, stage 3b: Secondary | ICD-10-CM | POA: Diagnosis not present

## 2023-03-08 DIAGNOSIS — I4821 Permanent atrial fibrillation: Secondary | ICD-10-CM | POA: Diagnosis not present

## 2023-03-08 DIAGNOSIS — C50911 Malignant neoplasm of unspecified site of right female breast: Secondary | ICD-10-CM | POA: Diagnosis not present

## 2023-03-09 DIAGNOSIS — N3020 Other chronic cystitis without hematuria: Secondary | ICD-10-CM | POA: Diagnosis not present

## 2023-03-09 DIAGNOSIS — R3914 Feeling of incomplete bladder emptying: Secondary | ICD-10-CM | POA: Diagnosis not present

## 2023-03-14 DIAGNOSIS — E871 Hypo-osmolality and hyponatremia: Secondary | ICD-10-CM | POA: Diagnosis not present

## 2023-03-23 ENCOUNTER — Inpatient Hospital Stay (HOSPITAL_BASED_OUTPATIENT_CLINIC_OR_DEPARTMENT_OTHER)
Admission: EM | Admit: 2023-03-23 | Discharge: 2023-03-25 | DRG: 291 | Disposition: A | Discharge status: Home Health | Payer: Medicare Other | Attending: Family Medicine | Admitting: Family Medicine

## 2023-03-23 ENCOUNTER — Other Ambulatory Visit (HOSPITAL_BASED_OUTPATIENT_CLINIC_OR_DEPARTMENT_OTHER): Payer: Self-pay

## 2023-03-23 ENCOUNTER — Emergency Department (HOSPITAL_BASED_OUTPATIENT_CLINIC_OR_DEPARTMENT_OTHER): Payer: Medicare Other

## 2023-03-23 ENCOUNTER — Encounter (HOSPITAL_BASED_OUTPATIENT_CLINIC_OR_DEPARTMENT_OTHER): Payer: Self-pay | Admitting: Emergency Medicine

## 2023-03-23 ENCOUNTER — Other Ambulatory Visit: Payer: Self-pay

## 2023-03-23 DIAGNOSIS — E785 Hyperlipidemia, unspecified: Secondary | ICD-10-CM | POA: Diagnosis present

## 2023-03-23 DIAGNOSIS — Z853 Personal history of malignant neoplasm of breast: Secondary | ICD-10-CM | POA: Diagnosis not present

## 2023-03-23 DIAGNOSIS — Z9842 Cataract extraction status, left eye: Secondary | ICD-10-CM | POA: Diagnosis not present

## 2023-03-23 DIAGNOSIS — R0602 Shortness of breath: Secondary | ICD-10-CM | POA: Diagnosis not present

## 2023-03-23 DIAGNOSIS — Z888 Allergy status to other drugs, medicaments and biological substances status: Secondary | ICD-10-CM | POA: Diagnosis not present

## 2023-03-23 DIAGNOSIS — K219 Gastro-esophageal reflux disease without esophagitis: Secondary | ICD-10-CM | POA: Diagnosis not present

## 2023-03-23 DIAGNOSIS — I272 Pulmonary hypertension, unspecified: Secondary | ICD-10-CM | POA: Diagnosis present

## 2023-03-23 DIAGNOSIS — I130 Hypertensive heart and chronic kidney disease with heart failure and stage 1 through stage 4 chronic kidney disease, or unspecified chronic kidney disease: Principal | ICD-10-CM | POA: Diagnosis present

## 2023-03-23 DIAGNOSIS — R062 Wheezing: Secondary | ICD-10-CM | POA: Diagnosis not present

## 2023-03-23 DIAGNOSIS — J811 Chronic pulmonary edema: Secondary | ICD-10-CM | POA: Diagnosis not present

## 2023-03-23 DIAGNOSIS — Z803 Family history of malignant neoplasm of breast: Secondary | ICD-10-CM

## 2023-03-23 DIAGNOSIS — I4821 Permanent atrial fibrillation: Secondary | ICD-10-CM | POA: Diagnosis present

## 2023-03-23 DIAGNOSIS — Z8673 Personal history of transient ischemic attack (TIA), and cerebral infarction without residual deficits: Secondary | ICD-10-CM

## 2023-03-23 DIAGNOSIS — Z9841 Cataract extraction status, right eye: Secondary | ICD-10-CM | POA: Diagnosis not present

## 2023-03-23 DIAGNOSIS — Z87891 Personal history of nicotine dependence: Secondary | ICD-10-CM | POA: Diagnosis not present

## 2023-03-23 DIAGNOSIS — I5033 Acute on chronic diastolic (congestive) heart failure: Secondary | ICD-10-CM | POA: Diagnosis present

## 2023-03-23 DIAGNOSIS — Z825 Family history of asthma and other chronic lower respiratory diseases: Secondary | ICD-10-CM

## 2023-03-23 DIAGNOSIS — N136 Pyonephrosis: Secondary | ICD-10-CM | POA: Diagnosis present

## 2023-03-23 DIAGNOSIS — Z96653 Presence of artificial knee joint, bilateral: Secondary | ICD-10-CM | POA: Diagnosis present

## 2023-03-23 DIAGNOSIS — Z6826 Body mass index (BMI) 26.0-26.9, adult: Secondary | ICD-10-CM

## 2023-03-23 DIAGNOSIS — Z823 Family history of stroke: Secondary | ICD-10-CM

## 2023-03-23 DIAGNOSIS — Z7901 Long term (current) use of anticoagulants: Secondary | ICD-10-CM

## 2023-03-23 DIAGNOSIS — B9562 Methicillin resistant Staphylococcus aureus infection as the cause of diseases classified elsewhere: Secondary | ICD-10-CM | POA: Diagnosis present

## 2023-03-23 DIAGNOSIS — N8110 Cystocele, unspecified: Secondary | ICD-10-CM | POA: Diagnosis present

## 2023-03-23 DIAGNOSIS — E8770 Fluid overload, unspecified: Secondary | ICD-10-CM | POA: Diagnosis not present

## 2023-03-23 DIAGNOSIS — E440 Moderate protein-calorie malnutrition: Secondary | ICD-10-CM | POA: Diagnosis present

## 2023-03-23 DIAGNOSIS — N183 Chronic kidney disease, stage 3 unspecified: Secondary | ICD-10-CM | POA: Insufficient documentation

## 2023-03-23 DIAGNOSIS — Z923 Personal history of irradiation: Secondary | ICD-10-CM | POA: Diagnosis not present

## 2023-03-23 DIAGNOSIS — Z1152 Encounter for screening for COVID-19: Secondary | ICD-10-CM | POA: Diagnosis not present

## 2023-03-23 DIAGNOSIS — I2510 Atherosclerotic heart disease of native coronary artery without angina pectoris: Secondary | ICD-10-CM | POA: Diagnosis present

## 2023-03-23 DIAGNOSIS — N1832 Chronic kidney disease, stage 3b: Secondary | ICD-10-CM | POA: Diagnosis not present

## 2023-03-23 DIAGNOSIS — I509 Heart failure, unspecified: Secondary | ICD-10-CM | POA: Diagnosis not present

## 2023-03-23 DIAGNOSIS — N3000 Acute cystitis without hematuria: Secondary | ICD-10-CM | POA: Diagnosis present

## 2023-03-23 DIAGNOSIS — T83511A Infection and inflammatory reaction due to indwelling urethral catheter, initial encounter: Secondary | ICD-10-CM | POA: Diagnosis present

## 2023-03-23 DIAGNOSIS — Z79899 Other long term (current) drug therapy: Secondary | ICD-10-CM

## 2023-03-23 DIAGNOSIS — R110 Nausea: Secondary | ICD-10-CM | POA: Diagnosis not present

## 2023-03-23 DIAGNOSIS — N3001 Acute cystitis with hematuria: Secondary | ICD-10-CM

## 2023-03-23 DIAGNOSIS — Z91041 Radiographic dye allergy status: Secondary | ICD-10-CM

## 2023-03-23 DIAGNOSIS — Y846 Urinary catheterization as the cause of abnormal reaction of the patient, or of later complication, without mention of misadventure at the time of the procedure: Secondary | ICD-10-CM | POA: Diagnosis present

## 2023-03-23 DIAGNOSIS — R0902 Hypoxemia: Secondary | ICD-10-CM | POA: Diagnosis present

## 2023-03-23 DIAGNOSIS — J90 Pleural effusion, not elsewhere classified: Secondary | ICD-10-CM | POA: Diagnosis not present

## 2023-03-23 DIAGNOSIS — R109 Unspecified abdominal pain: Secondary | ICD-10-CM | POA: Diagnosis not present

## 2023-03-23 DIAGNOSIS — Z9011 Acquired absence of right breast and nipple: Secondary | ICD-10-CM

## 2023-03-23 DIAGNOSIS — N390 Urinary tract infection, site not specified: Secondary | ICD-10-CM

## 2023-03-23 DIAGNOSIS — I10 Essential (primary) hypertension: Secondary | ICD-10-CM | POA: Diagnosis not present

## 2023-03-23 DIAGNOSIS — N1330 Unspecified hydronephrosis: Secondary | ICD-10-CM | POA: Insufficient documentation

## 2023-03-23 DIAGNOSIS — I5030 Unspecified diastolic (congestive) heart failure: Secondary | ICD-10-CM

## 2023-03-23 DIAGNOSIS — Z881 Allergy status to other antibiotic agents status: Secondary | ICD-10-CM | POA: Diagnosis not present

## 2023-03-23 DIAGNOSIS — Z800 Family history of malignant neoplasm of digestive organs: Secondary | ICD-10-CM

## 2023-03-23 LAB — TROPONIN I (HIGH SENSITIVITY)
Troponin I (High Sensitivity): 9 ng/L (ref <18)
Troponin I (High Sensitivity): 16 ng/L (ref <18)

## 2023-03-23 LAB — CBC WITH DIFFERENTIAL/PLATELET
Abs Immature Granulocytes: 0.1 10*3/uL — ABNORMAL HIGH (ref 0.00–0.07)
Basophils Absolute: 0 10*3/uL (ref 0.0–0.1)
Basophils Relative: 0 %
Eosinophils Absolute: 0.1 10*3/uL (ref 0.0–0.5)
Eosinophils Relative: 1 %
HCT: 34.1 % — ABNORMAL LOW (ref 36.0–46.0)
Hemoglobin: 11.3 g/dL — ABNORMAL LOW (ref 12.0–15.0)
Immature Granulocytes: 1 %
Lymphocytes Relative: 7 %
Lymphs Abs: 1.3 10*3/uL (ref 0.7–4.0)
MCH: 32.4 pg (ref 26.0–34.0)
MCHC: 33.1 g/dL (ref 30.0–36.0)
MCV: 97.7 fL (ref 80.0–100.0)
Monocytes Absolute: 1 10*3/uL (ref 0.1–1.0)
Monocytes Relative: 6 %
Neutro Abs: 15.7 10*3/uL — ABNORMAL HIGH (ref 1.7–7.7)
Neutrophils Relative %: 85 %
Platelets: 236 10*3/uL (ref 150–400)
RBC: 3.49 MIL/uL — ABNORMAL LOW (ref 3.87–5.11)
RDW: 14.8 % (ref 11.5–15.5)
WBC: 18.3 10*3/uL — ABNORMAL HIGH (ref 4.0–10.5)
nRBC: 0 % (ref 0.0–0.2)

## 2023-03-23 LAB — URINALYSIS, W/ REFLEX TO CULTURE (INFECTION SUSPECTED)
Bilirubin Urine: NEGATIVE
Glucose, UA: NEGATIVE mg/dL
Ketones, ur: NEGATIVE mg/dL
Nitrite: NEGATIVE
Protein, ur: NEGATIVE mg/dL
RBC / HPF: >50 RBC/hpf (ref 0–5)
Specific Gravity, Urine: 1.026 (ref 1.005–1.030)
pH: 7 (ref 5.0–8.0)

## 2023-03-23 LAB — COMPREHENSIVE METABOLIC PANEL
ALT: 16 U/L (ref 0–44)
AST: 24 U/L (ref 15–41)
Albumin: 4.2 g/dL (ref 3.5–5.0)
Alkaline Phosphatase: 76 U/L (ref 38–126)
Anion gap: 9 (ref 5–15)
BUN: 20 mg/dL (ref 8–23)
CO2: 29 mmol/L (ref 22–32)
Calcium: 9.9 mg/dL (ref 8.9–10.3)
Chloride: 99 mmol/L (ref 98–111)
Creatinine, Ser: 1.08 mg/dL — ABNORMAL HIGH (ref 0.44–1.00)
GFR, Estimated: 48 mL/min — ABNORMAL LOW (ref >60)
Glucose, Bld: 148 mg/dL — ABNORMAL HIGH (ref 70–99)
Potassium: 3.7 mmol/L (ref 3.5–5.1)
Sodium: 137 mmol/L (ref 135–145)
Total Bilirubin: 0.8 mg/dL (ref 0.3–1.2)
Total Protein: 7.2 g/dL (ref 6.5–8.1)

## 2023-03-23 LAB — RESP PANEL BY RT-PCR (RSV, FLU A&B, COVID)  RVPGX2
Influenza A by PCR: NEGATIVE
Influenza B by PCR: NEGATIVE
Resp Syncytial Virus by PCR: NEGATIVE
SARS Coronavirus 2 by RT PCR: NEGATIVE

## 2023-03-23 LAB — PROTIME-INR
INR: 1.3 — ABNORMAL HIGH (ref 0.8–1.2)
Prothrombin Time: 16 seconds — ABNORMAL HIGH (ref 11.4–15.2)

## 2023-03-23 LAB — LACTIC ACID, PLASMA
Lactic Acid, Venous: 1.5 mmol/L (ref 0.5–1.9)
Lactic Acid, Venous: 1.1 mmol/L (ref 0.5–1.9)

## 2023-03-23 LAB — BRAIN NATRIURETIC PEPTIDE: B Natriuretic Peptide: 555.9 pg/mL — ABNORMAL HIGH (ref 0.0–100.0)

## 2023-03-23 LAB — LIPASE, BLOOD: Lipase: 45 U/L (ref 11–51)

## 2023-03-23 MED ORDER — ONDANSETRON HCL 4 MG/2ML IJ SOLN: 4.0000 mg | Freq: Four times a day (QID) | INTRAMUSCULAR | Status: DC | PRN

## 2023-03-23 MED ORDER — ATORVASTATIN CALCIUM 10 MG PO TABS
10.0000 mg | ORAL_TABLET | Freq: Every day | ORAL | Status: DC
  Administered 2023-03-24 – 2023-03-25 (×2): 10 mg via ORAL
  Filled 2023-03-23 (×2): qty 1

## 2023-03-23 MED ORDER — ONDANSETRON HCL 4 MG PO TABS: 4.0000 mg | ORAL_TABLET | Freq: Four times a day (QID) | ORAL | Status: DC | PRN

## 2023-03-23 MED ORDER — SODIUM CHLORIDE 0.9 % IV SOLN
1.0000 g | Freq: Once | INTRAVENOUS | Status: AC
  Administered 2023-03-23: 1 g via INTRAVENOUS
  Filled 2023-03-23: qty 10

## 2023-03-23 MED ORDER — HYDROCODONE-ACETAMINOPHEN 5-325 MG PO TABS: 1.0000 | ORAL_TABLET | ORAL | Status: DC | PRN

## 2023-03-23 MED ORDER — ACETAMINOPHEN 650 MG RE SUPP: 650.0000 mg | Freq: Four times a day (QID) | RECTAL | Status: DC | PRN

## 2023-03-23 MED ORDER — IOHEXOL 350 MG/ML SOLN
100.0000 mL | Freq: Once | INTRAVENOUS | Status: AC | PRN
  Administered 2023-03-23: 60 mL via INTRAVENOUS

## 2023-03-23 MED ORDER — FUROSEMIDE 10 MG/ML IJ SOLN
40.0000 mg | Freq: Once | INTRAMUSCULAR | Status: AC
  Administered 2023-03-23: 40 mg via INTRAVENOUS
  Filled 2023-03-23: qty 4

## 2023-03-23 MED ORDER — POTASSIUM CHLORIDE CRYS ER 20 MEQ PO TBCR
20.0000 meq | EXTENDED_RELEASE_TABLET | Freq: Once | ORAL | Status: AC
  Administered 2023-03-23: 20 meq via ORAL
  Filled 2023-03-23: qty 1

## 2023-03-23 MED ORDER — ACETAMINOPHEN 325 MG PO TABS: 650.0000 mg | ORAL_TABLET | Freq: Four times a day (QID) | ORAL | Status: DC | PRN

## 2023-03-23 MED ORDER — METOPROLOL TARTRATE 100 MG PO TABS
100.0000 mg | ORAL_TABLET | Freq: Two times a day (BID) | ORAL | Status: DC
  Administered 2023-03-23 – 2023-03-25 (×4): 100 mg via ORAL
  Filled 2023-03-23 (×4): qty 1

## 2023-03-23 MED ORDER — DILTIAZEM HCL 60 MG PO TABS
60.0000 mg | ORAL_TABLET | Freq: Three times a day (TID) | ORAL | Status: DC
  Administered 2023-03-23 – 2023-03-25 (×5): 60 mg via ORAL
  Filled 2023-03-23 (×5): qty 1

## 2023-03-23 MED ORDER — VANCOMYCIN HCL IN DEXTROSE 1-5 GM/200ML-% IV SOLN
1000.0000 mg | INTRAVENOUS | Status: DC
  Administered 2023-03-23: 1000 mg via INTRAVENOUS
  Filled 2023-03-23: qty 200

## 2023-03-23 MED ORDER — ALLOPURINOL 100 MG PO TABS
100.0000 mg | ORAL_TABLET | Freq: Every day | ORAL | Status: DC
  Administered 2023-03-24 – 2023-03-25 (×2): 100 mg via ORAL
  Filled 2023-03-23 (×2): qty 1

## 2023-03-23 MED ORDER — APIXABAN 2.5 MG PO TABS
2.5000 mg | ORAL_TABLET | Freq: Two times a day (BID) | ORAL | Status: DC
  Administered 2023-03-23 – 2023-03-25 (×4): 2.5 mg via ORAL
  Filled 2023-03-23 (×4): qty 1

## 2023-03-23 MED ORDER — CHLORHEXIDINE GLUCONATE CLOTH 2 % EX PADS
6.0000 | MEDICATED_PAD | Freq: Every day | CUTANEOUS | Status: DC
  Administered 2023-03-24 – 2023-03-25 (×2): 6 via TOPICAL

## 2023-03-23 MED ORDER — SODIUM CHLORIDE 0.9 % IV SOLN
2.0000 g | INTRAVENOUS | Status: DC
  Administered 2023-03-24: 2 g via INTRAVENOUS
  Filled 2023-03-23: qty 20

## 2023-03-23 NOTE — Assessment & Plan Note (Signed)
As evidenced by moderaet loss of subcutaneous muscle mass and fat

## 2023-03-23 NOTE — Progress Notes (Signed)
Pharmacy Antibiotic Note  Adrienne [name] is a 89 y.o. female admitted on 03/23/2023 with UTI, recent culture with MRSA.  Pharmacy has been consulted for vancomycin dosing.  Plan: Vancomycin 1g IV q48h for estimated AUC 515 using SCr 1.08, Vd 0.72 Check vancomycin levels at steady state, goal AUC 400-550 Follow up renal function & cultures, clinical course, length of therapy  Height: 4\' 11"  (149.9 cm) Weight: 59 kg (130 lb 1.1 oz) IBW/kg (Calculated) : 43.2  Temp (24hrs), Avg:98.9 F (37.2 C), Min:98.2 F (36.8 C), Max:100.6 F (38.1 C)  Recent Labs  Lab 03/23/23 0818 03/23/23 1100  WBC 18.3*  --   CREATININE 1.08*  --   LATICACIDVEN 1.5 1.1    Estimated Creatinine Clearance: 25.4 mL/min (A) (by C-G formula based on SCr of 1.08 mg/dL (H)).    Allergies  Allergen Reactions   Valium [Diazepam] Other (See Comments)    HYPERACTIVITY    Amiodarone Hcl [Amiodarone] Other (See Comments)    A-Fib   Compazine [Prochlorperazine] Other (See Comments)    HYPERACTIVITY    Other Other (See Comments)    "Kidney dye"--patient states this gave her severe shakes/chills   Thorazine [Chlorpromazine] Other (See Comments)    HYPERACTIVITY    Zometa [Zoledronic Acid] Other (See Comments)    PROBLEMS WITH EYES  TIA   Ciprofloxacin Swelling and Other (See Comments)    "swelling" in Rt.elbow   Doxycycline Nausea Only   Zoster Vac Recomb Adjuvanted Other (See Comments)    Welt under injection site   Iodinated Contrast Media Anxiety and Other (See Comments)    Chills and anxiety  Other Reaction(s): chills/shaking, Kidney dye    Antimicrobials this admission: 7/11 Ceftriaxone >> 7/12 Vancomycin >>  Dose adjustments this admission:  Microbiology results: 7/11 BCx: 7/11 UCx:  Thank you for allowing pharmacy to be a part of this patient's care.  Loralee Pacas, PharmD, BCPS 03/23/2023 6:22 PM  Please check AMION for all [facility] Pharmacy phone numbers After 10:00 PM, call  Main Pharmacy [phone]

## 2023-03-23 NOTE — ED Triage Notes (Signed)
Pt arrives to ED with c/o abdominal pains, nausea, SOB, wheezing, and fever that started this morning. Spo2 in 80s on room ar.

## 2023-03-23 NOTE — Assessment & Plan Note (Addendum)
2 weeks prior to admission had routine foley exchange with Dr. McDiarmid.  Culture at that time grew MRSA, unfortunately, patient did not become aware of this until 2-3 days PTA, at which time she started nitrofurantoin.  - Continue vancomycin, Rocephin - Follow culture data

## 2023-03-23 NOTE — Assessment & Plan Note (Signed)
See above

## 2023-03-23 NOTE — Assessment & Plan Note (Addendum)
Presented with 2 weeks progressive LE swelling, weight up from 120 to 130 lbs.    Here, with dyspnea, hypoxia, LE edema, pulmonary edema and small effusions.  Echo last Dec showed EF 50-55%.  No significant valvular disease.    Net negative 1L yesterday, leg swelling better, still on o2 and still dyspneic. - Repeat IV furoesmide, 20 mg   - K supplement - Strict I/Os, daily weights, telemetry  - Daily monitoring renal function

## 2023-03-23 NOTE — Assessment & Plan Note (Signed)
Continue Eliquis, Lipitor

## 2023-03-23 NOTE — ED Notes (Addendum)
ED TO INPATIENT HANDOFF REPORT  ED Nurse Name and Phone #: [phone]   S Name/Age/Gender Adrienne [name] 89 y.o. female Room/Bed: DB016/DB016  Code Status   Code Status: Prior  Home/SNF/Other Home Patient oriented to: self, place, time, and situation Is this baseline? Yes   Triage Complete: Triage complete  Chief Complaint CHF (congestive heart failure) (HCC) [I50.9]  Triage Note Pt arrives to ED with c/o abdominal pains, nausea, SOB, wheezing, and fever that started this morning. Spo2 in 80s on room ar.     Allergies Allergies  Allergen Reactions   Valium [Diazepam] Other (See Comments)    HYPERACTIVITY    Amiodarone Hcl [Amiodarone] Other (See Comments)    A-Fib   Compazine [Prochlorperazine] Other (See Comments)    HYPERACTIVITY    Other Other (See Comments)    "Kidney dye"--patient states this gave her severe shakes/chills   Thorazine [Chlorpromazine] Other (See Comments)    HYPERACTIVITY    Zometa [Zoledronic Acid] Other (See Comments)    PROBLEMS WITH EYES  TIA   Ciprofloxacin Swelling and Other (See Comments)    "swelling" in Rt.elbow   Doxycycline Nausea Only   Zoster Vac Recomb Adjuvanted Other (See Comments)    Welt under injection site   Iodinated Contrast Media Anxiety and Other (See Comments)    Chills and anxiety  Other Reaction(s): chills/shaking, Kidney dye    Level of Care/Admitting Diagnosis ED Disposition     ED Disposition  Admit   Condition  --   Comment  Hospital Area: [facility] [[id number]]  Level of Care: Telemetry Medical [104]  May admit patient to [facility] or [facility] if equivalent level of care is available:: No  Interfacility transfer: Yes  Covid Evaluation: Asymptomatic - no recent exposure (last 10 days) testing not required  Diagnosis: CHF (congestive heart failure) (HCC) [454098]  Admitting Physician: Emeline General [[id number]]  Attending Physician: Emeline General [[id number]]  Certification::  I certify this patient will need inpatient services for at least 2 midnights  Estimated Length of Stay: 2          B Medical/Surgery History Past Medical History:  Diagnosis Date   Afib (HCC)    Arthritis of ankle    CAD (coronary artery disease)    Cancer (HCC) 2012   breast cancer-right   Compression fracture of T12 vertebra (HCC)    Diverticulosis    Edema    Elevated uric acid in blood    GERD (gastroesophageal reflux disease)    GI bleed 12/2015   Hyperlipidemia    Hypertension    Long-term (current) use of anticoagulants    Moderate mitral regurgitation    Personal history of radiation therapy    Pulmonary HTN (HCC)    Echo 1/19: EF 60-65, no RWMA, Ao sclerosis, trivial AI, mild MR, mod BAE, mod TR, PASP 49   Stroke (HCC) 01/25/2016   TIA (transient ischemic attack) 09/2011   Urinary incontinence    Urinary retention    Past Surgical History:  Procedure Laterality Date   APPENDECTOMY     AXILLARY SENTINEL NODE BIOPSY Left 01/22/2020   Procedure: Left Axillary Sentinel Node Biopsy;  Surgeon: Emelia Loron, MD;  Location[facility] OR;  Service: General;  Laterality: Left;   BREAST LUMPECTOMY WITH RADIOACTIVE SEED AND SENTINEL LYMPH NODE BIOPSY Left 01/22/2020   Procedure: LEFT BREAST LUMPECTOMY WITH RADIOACTIVE SEED;  Surgeon: Emelia Loron, MD;  Location: [facility] OR;  Service: General;  Laterality: Left;  BREAST SURGERY  2012   Rt.mastectomy--[place], [place]   CARDIAC CATHETERIZATION  2013   CATARACT EXTRACTION, BILATERAL     CERVICAL CONIZATION W/BX N/A 08/08/2020   Procedure: VAGINAL SIDEWALL BIOPSY;  Surgeon: Jerene Bears, MD;  Location: [facility] OR;  Service: Gynecology;  Laterality: N/A;   DILATION AND CURETTAGE OF UTERUS     in her early 65's for DUB   ESOPHAGOGASTRODUODENOSCOPY Left 01/08/2016   Procedure: ESOPHAGOGASTRODUODENOSCOPY (EGD);  Surgeon: Willis Modena, MD;  Location: [facility];  Service: Endoscopy;  Laterality: Left;   GALLBLADDER SURGERY      MASTECTOMY Right 2012   RADIOLOGY WITH ANESTHESIA N/A 01/25/2016   Procedure: RADIOLOGY WITH ANESTHESIA;  Surgeon: Julieanne Cotton, MD;  Location: [facility] OR;  Service: Radiology;  Laterality: N/A;   REPLACEMENT TOTAL KNEE BILATERAL     TONSILLECTOMY AND ADENOIDECTOMY     as a child     A IV Location/Drains/Wounds Patient Lines/Drains/Airways Status     Active Line/Drains/Airways     Name Placement date Placement time Site Days   Peripheral IV 03/23/23 20 G 1" Anterior;Left;Proximal Forearm 03/23/23  0823  Forearm  less than 1   Peripheral IV 03/23/23 18 G Right Antecubital 03/23/23  0848  Antecubital  less than 1   Urethral Catheter AGC 14 Fr. 02/04/23  1706  --  47            Intake/Output Last 24 hours  Intake/Output Summary (Last 24 hours) at 03/23/2023 1247 Last data filed at 03/23/2023 1246 Gross per 24 hour  Intake 100 ml  Output --  Net 100 ml    Labs/Imaging Results for orders placed or performed during the hospital encounter of 03/23/23 (from the past 48 hour(s))  Comprehensive metabolic panel     Status: Abnormal   Collection Time: 03/23/23  8:18 AM  Result Value Ref Range   Sodium 137 135 - 145 mmol/L   Potassium 3.7 3.5 - 5.1 mmol/L   Chloride 99 98 - 111 mmol/L   CO2 29 22 - 32 mmol/L   Glucose, Bld 148 (H) 70 - 99 mg/dL    Comment: Glucose reference range applies only to samples taken after fasting for at least 8 hours.   BUN 20 8 - 23 mg/dL   Creatinine, Ser 1.88 (H) 0.44 - 1.00 mg/dL   Calcium 9.9 8.9 - 41.6 mg/dL   Total Protein 7.2 6.5 - 8.1 g/dL   Albumin 4.2 3.5 - 5.0 g/dL   AST 24 15 - 41 U/L   ALT 16 0 - 44 U/L   Alkaline Phosphatase 76 38 - 126 U/L   Total Bilirubin 0.8 0.3 - 1.2 mg/dL   GFR, Estimated 48 (L) >60 mL/min    Comment: (NOTE) Calculated using the CKD-EPI Creatinine Equation (2021)    Anion gap 9 5 - 15    Comment: Performed at [facility], [address] [place], [place] [address]  Lactic acid, plasma      Status: None   Collection Time: 03/23/23  8:18 AM  Result Value Ref Range   Lactic Acid, Venous 1.5 0.5 - 1.9 mmol/L    Comment: Performed at [facility], [address], [place], [place] [address]  CBC with Differential     Status: Abnormal   Collection Time: 03/23/23  8:18 AM  Result Value Ref Range   WBC 18.3 (H) 4.0 - 10.5 K/uL   RBC 3.49 (L) 3.87 - 5.11 MIL/uL   Hemoglobin 11.3 (L) 12.0 -  15.0 g/dL   HCT 78.2 (L) 95.6 - 21.3 %   MCV 97.7 80.0 - 100.0 fL   MCH 32.4 26.0 - 34.0 pg   MCHC 33.1 30.0 - 36.0 g/dL   RDW 08.6 57.8 - 46.9 %   Platelets 236 150 - 400 K/uL   nRBC 0.0 0.0 - 0.2 %   Neutrophils Relative % 85 %   Neutro Abs 15.7 (H) 1.7 - 7.7 K/uL   Lymphocytes Relative 7 %   Lymphs Abs 1.3 0.7 - 4.0 K/uL   Monocytes Relative 6 %   Monocytes Absolute 1.0 0.1 - 1.0 K/uL   Eosinophils Relative 1 %   Eosinophils Absolute 0.1 0.0 - 0.5 K/uL   Basophils Relative 0 %   Basophils Absolute 0.0 0.0 - 0.1 K/uL   Immature Granulocytes 1 %   Abs Immature Granulocytes 0.10 (H) 0.00 - 0.07 K/uL    Comment: Performed at [facility], [address] [place], [place] [address]  Protime-INR     Status: Abnormal   Collection Time: 03/23/23  8:18 AM  Result Value Ref Range   Prothrombin Time 16.0 (H) 11.4 - 15.2 seconds   INR 1.3 (H) 0.8 - 1.2    Comment: (NOTE) INR goal varies based on device and disease states. Performed at [facility], [address] [place], [place] [address]   Brain natriuretic peptide     Status: Abnormal   Collection Time: 03/23/23  8:18 AM  Result Value Ref Range   B Natriuretic Peptide 555.9 (H) 0.0 - 100.0 pg/mL    Comment: Performed at [facility], [address] [place], [place] [address]  Troponin I (High Sensitivity)     Status: None   Collection Time: 03/23/23  8:18 AM  Result Value Ref Range   Troponin I (High Sensitivity) 9 <18 ng/L    Comment:  (NOTE) Elevated high sensitivity troponin I (hsTnI) values and significant  changes across serial measurements may suggest ACS but many other  chronic and acute conditions are known to elevate hsTnI results.  Refer to the "Links" section for chest pain algorithms and additional  guidance. Performed at [facility], [address] [place], [place] [address]   Resp panel by RT-PCR (RSV, Flu A&B, Covid) Anterior Nasal Swab     Status: None   Collection Time: 03/23/23  8:23 AM   Specimen: Anterior Nasal Swab  Result Value Ref Range   SARS Coronavirus 2 by RT PCR NEGATIVE NEGATIVE    Comment: (NOTE) SARS-CoV-2 target nucleic acids are NOT DETECTED.  The SARS-CoV-2 RNA is generally detectable in upper respiratory specimens during the acute phase of infection. The lowest concentration of SARS-CoV-2 viral copies this assay can detect is 138 copies/mL. A negative result does not preclude SARS-Cov-2 infection and should not be used as the sole basis for treatment or other patient management decisions. A negative result may occur with  improper specimen collection/handling, submission of specimen other than nasopharyngeal swab, presence of viral mutation(s) within the areas targeted by this assay, and inadequate number of viral copies(<138 copies/mL). A negative result must be combined with clinical observations, patient history, and epidemiological information. The expected result is Negative.  Fact Sheet for Patients:  BloggerCourse.com  Fact Sheet for Healthcare Providers:  SeriousBroker.it  This test is no t yet approved or cleared by the [place] FDA and  has been authorized for detection and/or diagnosis of SARS-CoV-2 by FDA under an Emergency Use Authorization (EUA). This  EUA will remain  in effect (meaning this test can be used) for the duration of the COVID-19 declaration under Section 564(b)(1) of the  Act, 21 U.S.C.section 360bbb-3(b)(1), unless the authorization is terminated  or revoked sooner.       Influenza A by PCR NEGATIVE NEGATIVE   Influenza B by PCR NEGATIVE NEGATIVE    Comment: (NOTE) The Xpert Xpress SARS-CoV-2/FLU/RSV plus assay is intended as an aid in the diagnosis of influenza from Nasopharyngeal swab specimens and should not be used as a sole basis for treatment. Nasal washings and aspirates are unacceptable for Xpert Xpress SARS-CoV-2/FLU/RSV testing.  Fact Sheet for Patients: BloggerCourse.com  Fact Sheet for Healthcare Providers: SeriousBroker.it  This test is not yet approved or cleared by the [place] FDA and has been authorized for detection and/or diagnosis of SARS-CoV-2 by FDA under an Emergency Use Authorization (EUA). This EUA will remain in effect (meaning this test can be used) for the duration of the COVID-19 declaration under Section 564(b)(1) of the Act, 21 U.S.C. section 360bbb-3(b)(1), unless the authorization is terminated or revoked.     Resp Syncytial Virus by PCR NEGATIVE NEGATIVE    Comment: (NOTE) Fact Sheet for Patients: BloggerCourse.com  Fact Sheet for Healthcare Providers: SeriousBroker.it  This test is not yet approved or cleared by the [place] FDA and has been authorized for detection and/or diagnosis of SARS-CoV-2 by FDA under an Emergency Use Authorization (EUA). This EUA will remain in effect (meaning this test can be used) for the duration of the COVID-19 declaration under Section 564(b)(1) of the Act, 21 U.S.C. section 360bbb-3(b)(1), unless the authorization is terminated or revoked.  Performed at [facility], [address] [place], [place] [address]   Lactic acid, plasma     Status: None   Collection Time: 03/23/23 11:00 AM  Result Value Ref Range   Lactic Acid, Venous 1.1 0.5 -  1.9 mmol/L    Comment: Performed at [facility], [address] [place], [place] [address]  Urinalysis, w/ Reflex to Culture (Infection Suspected) -Urine, Clean Catch     Status: Abnormal   Collection Time: 03/23/23 11:00 AM  Result Value Ref Range   Specimen Source URINE, CATHETERIZED    Color, Urine YELLOW YELLOW   APPearance CLEAR CLEAR   Specific Gravity, Urine 1.026 1.005 - 1.030   pH 7.0 5.0 - 8.0   Glucose, UA NEGATIVE NEGATIVE mg/dL   Hgb urine dipstick LARGE (A) NEGATIVE   Bilirubin Urine NEGATIVE NEGATIVE   Ketones, ur NEGATIVE NEGATIVE mg/dL   Protein, ur NEGATIVE NEGATIVE mg/dL   Nitrite NEGATIVE NEGATIVE   Leukocytes,Ua MODERATE (A) NEGATIVE   RBC / HPF >50 0 - 5 RBC/hpf   WBC, UA 21-50 0 - 5 WBC/hpf    Comment:        Reflex urine culture not performed if WBC <=10, OR if Squamous epithelial cells >5. If Squamous epithelial cells >5 suggest recollection.    Bacteria, UA RARE (A) NONE SEEN   Squamous Epithelial / HPF 0-5 0 - 5 /HPF    Comment: Performed at [facility], [address], [place], [place] [address]  Lipase, blood     Status: None   Collection Time: 03/23/23 11:00 AM  Result Value Ref Range   Lipase 45 11 - 51 U/L    Comment: Performed at [facility], [address], [place], [place] [address]  Troponin I (High Sensitivity)     Status: None   Collection  Time: 03/23/23 11:00 AM  Result Value Ref Range   Troponin I (High Sensitivity) 16 <18 ng/L    Comment: (NOTE) Elevated high sensitivity troponin I (hsTnI) values and significant  changes across serial measurements may suggest ACS but many other  chronic and acute conditions are known to elevate hsTnI results.  Refer to the "Links" section for chest pain algorithms and additional  guidance. Performed at [facility], [address], [place], [place] [address]    CT ABDOMEN PELVIS W CONTRAST  Result Date:  03/23/2023 CLINICAL DATA:  Acute nonlocalized abdominal pain, nausea. Fever, wheezing, hypoxia, dyspnea. EXAM: CT ANGIOGRAPHY CHEST CT ABDOMEN AND PELVIS WITH CONTRAST TECHNIQUE: Multidetector CT imaging of the chest was performed using the standard protocol during bolus administration of intravenous contrast. Multiplanar CT image reconstructions and MIPs were obtained to evaluate the vascular anatomy. Multidetector CT imaging of the abdomen and pelvis was performed using the standard protocol during bolus administration of intravenous contrast. RADIATION DOSE REDUCTION: This exam was performed according to the departmental dose-optimization program which includes automated exposure control, adjustment of the mA and/or kV according to patient size and/or use of iterative reconstruction technique. CONTRAST:  60mL OMNIPAQUE IOHEXOL 350 MG/ML SOLN COMPARISON:  None Available. FINDINGS: CTA CHEST FINDINGS Cardiovascular: There is adequate opacification of the pulmonary arterial tree. There is no intraluminal filling defect identified to suggest acute pulmonary embolism. Global cardiac size is at the upper limits of normal with mild enlargement of the right atrium. Moderate multi-vessel coronary artery calcification noted. Mild calcification of the aortic valve leaflets noted. No pericardial effusion. The thoracic aorta is of normal caliber. Moderate atherosclerotic calcification noted within the thoracic aorta. Mediastinum/Nodes: Visualized thyroid is unremarkable. No pathologic thoracic adenopathy. The esophagus is unremarkable. Lungs/Pleura: Small bilateral pleural effusions are present. No pneumothorax. Subpleural fibrotic changes within the anterior left upper lobe and lingula likely reflect the sequela of prior conservative breast therapy. Trace interstitial pulmonary edema is present with development of mild smooth interlobular septal thickening best appreciated at the lung apices and lung bases. Superimposed  ground-glass opacity likely reflects atelectasis on this expiratory phase examination. Mean 9 mm nodule within the subpleural right lower lobe along the right hemidiaphragm and axial image # 115/5 is decreased in size since remote prior examination of 06/29/2020 and is safely considered benign, likely the sequela of remote inflammation. Musculoskeletal: No acute bone abnormality. Remote T12 inferior endplate fracture with mild loss of height. No lytic or blastic bone lesion. Osseous structures are age-appropriate. Review of the MIP images confirms the above findings. CT ABDOMEN and PELVIS FINDINGS Hepatobiliary: No focal liver abnormality is seen. Status post cholecystectomy. No biliary dilatation. Pancreas: Unremarkable. No pancreatic ductal dilatation or surrounding inflammatory changes. Spleen: Unremarkable Adrenals/Urinary Tract: There is severe pelvic floor laxity with prolapse of the anterior compartment with the base of bladder approximately 8 cm below the pubococcygeal line. Foley catheter balloon is seen within a decompressed bladder lumen. There is narrowing of the distal left ureter best seen on axial image # 76-77, series # 2 which may relate to extrinsic compression by marked anterior compartment prolapse or an intrinsic stricture. This results in severe left hydronephrosis and hydroureter and delayed left renal cortical enhancement related to obstructive uropathy. 2 mm nonobstructing calculus noted within the interpolar region of the left kidney. No additional intrarenal or ureteral calculi are identified. The right kidney appears mildly malrotated within the anterior-posterior dimension, but is otherwise unremarkable. No right-sided hydronephrosis identified. Stomach/Bowel: There is severe posterior  compartment descent with prolapse of the rectum and the anorectal junction approximately 9.9 cm below the pubococcygeal line. There is marked perirectal edema involving the prolapsed rectum, best  appreciated on sagittal image # 60/6. There is no evidence of obstruction. Severe distal colonic diverticulosis without superimposed acute inflammatory change. The stomach, small bowel, and large bowel are otherwise unremarkable. No free intraperitoneal gas or fluid. Status post appendectomy. Vascular/Lymphatic: Moderate aortoiliac atherosclerotic calcification with particularly prominent atherosclerotic calcification at the renal ostia bilaterally though the degree of stenosis is not well assessed on this non arteriographic study. No aortic aneurysm. No pathologic adenopathy within the abdomen and pelvis. Reproductive: Status post hysterectomy.  No adnexal masses. Other: None significant Musculoskeletal: No acute bone abnormality. No lytic or blastic bone lesion. Review of the MIP images confirms the above findings. IMPRESSION: 1. No pulmonary embolism. 2. Small bilateral pleural effusions with trace interstitial pulmonary edema. Together, the findings may reflect changes of mild cardiogenic failure. 3. Moderate multi-vessel coronary artery calcification. 4. Mild calcification of the aortic valve leaflets. Echocardiographic correlation for evaluation of potential aortic stenosis may be helpful for further evaluation. 5. Severe posterior compartment descent with prolapse of the rectum and the anorectal junction approximately 9.9 cm below the pubococcygeal line. Marked perirectal edema involving the prolapsed rectum, possibly reflecting a component of proctitis. No evidence of obstruction. 6. Severe pelvic floor laxity with prolapse of the anterior compartment with the base of bladder approximately 8 cm below the pubococcygeal line. Narrowing of the distal left ureter which may relate to extrinsic compression by marked anterior compartment prolapse or an intrinsic stricture. This results in severe left hydronephrosis and hydroureter and delayed left renal cortical enhancement related to obstructive uropathy. 7. 2 mm  nonobstructing left renal calculus. 8. Severe distal colonic diverticulosis without superimposed acute inflammatory change. Aortic Atherosclerosis (ICD10-I70.0). Electronically Signed   By: Helyn Numbers M.D.   On: 03/23/2023 09:51   CT Angio Chest PE W and/or Wo Contrast  Result Date: 03/23/2023 CLINICAL DATA:  Acute nonlocalized abdominal pain, nausea. Fever, wheezing, hypoxia, dyspnea. EXAM: CT ANGIOGRAPHY CHEST CT ABDOMEN AND PELVIS WITH CONTRAST TECHNIQUE: Multidetector CT imaging of the chest was performed using the standard protocol during bolus administration of intravenous contrast. Multiplanar CT image reconstructions and MIPs were obtained to evaluate the vascular anatomy. Multidetector CT imaging of the abdomen and pelvis was performed using the standard protocol during bolus administration of intravenous contrast. RADIATION DOSE REDUCTION: This exam was performed according to the departmental dose-optimization program which includes automated exposure control, adjustment of the mA and/or kV according to patient size and/or use of iterative reconstruction technique. CONTRAST:  60mL OMNIPAQUE IOHEXOL 350 MG/ML SOLN COMPARISON:  None Available. FINDINGS: CTA CHEST FINDINGS Cardiovascular: There is adequate opacification of the pulmonary arterial tree. There is no intraluminal filling defect identified to suggest acute pulmonary embolism. Global cardiac size is at the upper limits of normal with mild enlargement of the right atrium. Moderate multi-vessel coronary artery calcification noted. Mild calcification of the aortic valve leaflets noted. No pericardial effusion. The thoracic aorta is of normal caliber. Moderate atherosclerotic calcification noted within the thoracic aorta. Mediastinum/Nodes: Visualized thyroid is unremarkable. No pathologic thoracic adenopathy. The esophagus is unremarkable. Lungs/Pleura: Small bilateral pleural effusions are present. No pneumothorax. Subpleural fibrotic changes  within the anterior left upper lobe and lingula likely reflect the sequela of prior conservative breast therapy. Trace interstitial pulmonary edema is present with development of mild smooth interlobular septal thickening best appreciated at the lung apices  and lung bases. Superimposed ground-glass opacity likely reflects atelectasis on this expiratory phase examination. Mean 9 mm nodule within the subpleural right lower lobe along the right hemidiaphragm and axial image # 115/5 is decreased in size since remote prior examination of 06/29/2020 and is safely considered benign, likely the sequela of remote inflammation. Musculoskeletal: No acute bone abnormality. Remote T12 inferior endplate fracture with mild loss of height. No lytic or blastic bone lesion. Osseous structures are age-appropriate. Review of the MIP images confirms the above findings. CT ABDOMEN and PELVIS FINDINGS Hepatobiliary: No focal liver abnormality is seen. Status post cholecystectomy. No biliary dilatation. Pancreas: Unremarkable. No pancreatic ductal dilatation or surrounding inflammatory changes. Spleen: Unremarkable Adrenals/Urinary Tract: There is severe pelvic floor laxity with prolapse of the anterior compartment with the base of bladder approximately 8 cm below the pubococcygeal line. Foley catheter balloon is seen within a decompressed bladder lumen. There is narrowing of the distal left ureter best seen on axial image # 76-77, series # 2 which may relate to extrinsic compression by marked anterior compartment prolapse or an intrinsic stricture. This results in severe left hydronephrosis and hydroureter and delayed left renal cortical enhancement related to obstructive uropathy. 2 mm nonobstructing calculus noted within the interpolar region of the left kidney. No additional intrarenal or ureteral calculi are identified. The right kidney appears mildly malrotated within the anterior-posterior dimension, but is otherwise unremarkable.  No right-sided hydronephrosis identified. Stomach/Bowel: There is severe posterior compartment descent with prolapse of the rectum and the anorectal junction approximately 9.9 cm below the pubococcygeal line. There is marked perirectal edema involving the prolapsed rectum, best appreciated on sagittal image # 60/6. There is no evidence of obstruction. Severe distal colonic diverticulosis without superimposed acute inflammatory change. The stomach, small bowel, and large bowel are otherwise unremarkable. No free intraperitoneal gas or fluid. Status post appendectomy. Vascular/Lymphatic: Moderate aortoiliac atherosclerotic calcification with particularly prominent atherosclerotic calcification at the renal ostia bilaterally though the degree of stenosis is not well assessed on this non arteriographic study. No aortic aneurysm. No pathologic adenopathy within the abdomen and pelvis. Reproductive: Status post hysterectomy.  No adnexal masses. Other: None significant Musculoskeletal: No acute bone abnormality. No lytic or blastic bone lesion. Review of the MIP images confirms the above findings. IMPRESSION: 1. No pulmonary embolism. 2. Small bilateral pleural effusions with trace interstitial pulmonary edema. Together, the findings may reflect changes of mild cardiogenic failure. 3. Moderate multi-vessel coronary artery calcification. 4. Mild calcification of the aortic valve leaflets. Echocardiographic correlation for evaluation of potential aortic stenosis may be helpful for further evaluation. 5. Severe posterior compartment descent with prolapse of the rectum and the anorectal junction approximately 9.9 cm below the pubococcygeal line. Marked perirectal edema involving the prolapsed rectum, possibly reflecting a component of proctitis. No evidence of obstruction. 6. Severe pelvic floor laxity with prolapse of the anterior compartment with the base of bladder approximately 8 cm below the pubococcygeal line. Narrowing  of the distal left ureter which may relate to extrinsic compression by marked anterior compartment prolapse or an intrinsic stricture. This results in severe left hydronephrosis and hydroureter and delayed left renal cortical enhancement related to obstructive uropathy. 7. 2 mm nonobstructing left renal calculus. 8. Severe distal colonic diverticulosis without superimposed acute inflammatory change. Aortic Atherosclerosis (ICD10-I70.0). Electronically Signed   By: Helyn Numbers M.D.   On: 03/23/2023 09:51   DG Chest Portable 1 View  Result Date: 03/23/2023 CLINICAL DATA:  hypoxia, SOB, abd pain, nausea EXAM: PORTABLE CHEST 1  VIEW COMPARISON:  02/04/2023. FINDINGS: Redemonstration of increased bronchovascular markings throughout bilateral lungs, likely age-related. No frank pulmonary edema. No consolidation or major atelectasis. Bilateral costophrenic angles are clear. Stable cardio-mediastinal silhouette. Aortic knob calcifications noted. No acute osseous abnormalities. The soft tissues are within normal limits. There are surgical staples in the left breast/axillary region. IMPRESSION: 1. No active disease. 2. Aortic atherosclerosis. Aortic Atherosclerosis (ICD10-I70.0). Electronically Signed   By: Jules Schick M.D.   On: 03/23/2023 08:54    Pending Labs Unresulted Labs (From admission, onward)     Start     Ordered   03/23/23 1610  Urine Culture  Once,   URGENT       Question:  Indication  Answer:  Suprapubic pain   03/23/23 0826   03/23/23 0818  Culture, blood (Routine x 2)  BLOOD CULTURE X 2,   R (with STAT occurrences)      03/23/23 0818            Vitals/Pain Today's Vitals   03/23/23 0815 03/23/23 0949 03/23/23 1100 03/23/23 1200  BP: (!) 141/67 135/69 (!) 147/70 (!) 143/74  Pulse: (!) 103 81 85 81  Resp: (!) 26 (!) 22 (!) 28 (!) 26  Temp:  (!) 100.6 F (38.1 C)    TempSrc:  Rectal    SpO2: 100% 95% 99% 100%  Weight:      Height:      PainSc:        Isolation  Precautions No active isolations  Medications Medications  iohexol (OMNIPAQUE) 350 MG/ML injection 100 mL (60 mLs Intravenous Contrast Given 03/23/23 0911)  cefTRIAXone (ROCEPHIN) 1 g in sodium chloride 0.9 % 100 mL IVPB (0 g Intravenous Stopped 03/23/23 1246)    Mobility walks with device     Focused Assessments Pulmonary Assessment Handoff:  Lung sounds: Bilateral Breath Sounds: Diminished, Clear L Breath Sounds: Diminished R Breath Sounds: Clear O2 Device: Nasal Cannula O2 Flow Rate (L/min): 2 L/min   Vaginal Prolapse, Chronic foley  Pitting edema LE L>R Afib on thinners for it. Last catheter change 2 weeks ago new bag today    R Recommendations: See Admitting Provider Note  Report given to:   Additional Notes: [phone]

## 2023-03-23 NOTE — Hospital Course (Addendum)
Adrienne [name] is a 89 y.o. F with severe bladder prolapse and chronic indwelling foley, cAF on Eliquis, dCHF, pHTN, CAD, CKD IIIb baseline 1.1-1.4, and hx CVA admitted for CHF flare and complicated MRSA UTI.

## 2023-03-23 NOTE — H&P (Signed)
History and Physical    Patient: Adrienne [name] ZOX:[id number] DOB: June 20, 1928 DOA: 03/23/2023 DOS: the patient was seen and examined on 03/23/2023 PCP: Sigmund Hazel, MD  Patient coming from: Home  Chief Complaint:  Chief Complaint  Patient presents with   Shortness of Breath   Nausea   Abdominal Pain       HPI:  Adrienne [name] is a 89 y.o. F with severe bladder prolapse and chronic indwelling foley, cAF on Eliquis, dCHF, pHTN, CAD, CKD IIIb baseline 1.1-1.4, and hx CVA who presented with abdominal pain, nausea, SOB, and leg swelling.  Over last week, noticed increased LE edema.  On day of admission, woke with abdominal pain, slowly developed SOB, wheezing, chest tightness and came to the ER.  In the ER, febrile, WBC 18K, tachypneic and gasping, SpO2 84% on RA.  CTA chest ruled out PE but showed some pulmonary edema.  CT abdomen showed severe hydronephrosis on the left.  Patient was given rocephin and admitted.      Review of Systems  Constitutional:  Positive for chills, malaise/fatigue and weight loss. Negative for fever.  Respiratory:  Positive for shortness of breath and wheezing.   Cardiovascular:  Positive for chest pain and leg swelling.  Gastrointestinal:  Positive for abdominal pain and nausea. Negative for diarrhea and vomiting.  Genitourinary:  Positive for hematuria. Negative for dysuria, flank pain, frequency and urgency.  All other systems reviewed and are negative.    Past Medical History:  Diagnosis Date   Afib (HCC)    Arthritis of ankle    CAD (coronary artery disease)    Cancer (HCC) 2012   breast cancer-right   Compression fracture of T12 vertebra (HCC)    Diverticulosis    Edema    Elevated uric acid in blood    GERD (gastroesophageal reflux disease)    GI bleed 12/2015   Hyperlipidemia    Hypertension    Long-term (current) use of anticoagulants    Moderate mitral regurgitation    Personal history of radiation therapy     Pulmonary HTN (HCC)    Echo 1/19: EF 60-65, no RWMA, Ao sclerosis, trivial AI, mild MR, mod BAE, mod TR, PASP 49   Stroke (HCC) 01/25/2016   TIA (transient ischemic attack) 09/2011   Urinary incontinence    Urinary retention    Past Surgical History:  Procedure Laterality Date   APPENDECTOMY     AXILLARY SENTINEL NODE BIOPSY Left 01/22/2020   Procedure: Left Axillary Sentinel Node Biopsy;  Surgeon: Emelia Loron, MD;  Location: [facility] OR;  Service: General;  Laterality: Left;   BREAST LUMPECTOMY WITH RADIOACTIVE SEED AND SENTINEL LYMPH NODE BIOPSY Left 01/22/2020   Procedure: LEFT BREAST LUMPECTOMY WITH RADIOACTIVE SEED;  Surgeon: Emelia Loron, MD;  Location: [facility] OR;  Service: General;  Laterality: Left;   BREAST SURGERY  2012   Rt.mastectomy--[place], [place]   CARDIAC CATHETERIZATION  2013   CATARACT EXTRACTION, BILATERAL     CERVICAL CONIZATION W/BX N/A 08/08/2020   Procedure: VAGINAL SIDEWALL BIOPSY;  Surgeon: Jerene Bears, MD;  Location: [facility] OR;  Service: Gynecology;  Laterality: N/A;   DILATION AND CURETTAGE OF UTERUS     in her early 51's for DUB   ESOPHAGOGASTRODUODENOSCOPY Left 01/08/2016   Procedure: ESOPHAGOGASTRODUODENOSCOPY (EGD);  Surgeon: Willis Modena, MD;  Location: [facility];  Service: Endoscopy;  Laterality: Left;   GALLBLADDER SURGERY     MASTECTOMY Right 2012   RADIOLOGY WITH ANESTHESIA N/A 01/25/2016   Procedure: RADIOLOGY WITH  ANESTHESIA;  Surgeon: Julieanne Cotton, MD;  Location: [facility] OR;  Service: Radiology;  Laterality: N/A;   REPLACEMENT TOTAL KNEE BILATERAL     TONSILLECTOMY AND ADENOIDECTOMY     as a child   Social History:  reports that she quit smoking about 74 years ago. Her smoking use included cigarettes. She started smoking about 76 years ago. She has a 0.5 pack-year smoking history. She has never used smokeless tobacco. She reports that she does not drink alcohol and does not use drugs.  Allergies  Allergen Reactions   Valium [Diazepam] Other  (See Comments)    HYPERACTIVITY    Amiodarone Hcl [Amiodarone] Other (See Comments)    A-Fib   Compazine [Prochlorperazine] Other (See Comments)    HYPERACTIVITY    Other Other (See Comments)    "Kidney dye"--patient states this gave her severe shakes/chills   Thorazine [Chlorpromazine] Other (See Comments)    HYPERACTIVITY    Zometa [Zoledronic Acid] Other (See Comments)    PROBLEMS WITH EYES  TIA   Ciprofloxacin Swelling and Other (See Comments)    "swelling" in Rt.elbow   Doxycycline Nausea Only   Zoster Vac Recomb Adjuvanted Other (See Comments)    Welt under injection site   Iodinated Contrast Media Anxiety and Other (See Comments)    Chills and anxiety  Other Reaction(s): chills/shaking, Kidney dye    Family History  Problem Relation Age of Onset   Asthma Mother    CVA Father    Atrial fibrillation Sister        HAS PACER   Pneumonia Brother    Cancer Daughter        COLON CANCER   Cancer Maternal Grandmother        breast cancer    Prior to Admission medications   Medication Sig Start Date End Date Taking? Authorizing Provider  acetaminophen (TYLENOL) 500 MG tablet Take 500-1,000 mg by mouth every 12 (twelve) hours as needed (pain).    Yes [provider]  albuterol (VENTOLIN HFA) 108 (90 Base) MCG/ACT inhaler Inhale 1 puff into the lungs every 6 (six) hours as needed for wheezing or shortness of breath (Cough). 09/08/22  Yes Regalado, Belkys A, MD  allopurinol (ZYLOPRIM) 100 MG tablet Take 1 tablet (100 mg total) by mouth daily. 09/08/22  Yes Regalado, Belkys A, MD  apixaban (ELIQUIS) 2.5 MG TABS tablet Take 1 tablet (2.5 mg total) by mouth 2 (two) times daily. 10/07/22  Yes Jake Bathe, MD  ascorbic acid (VITAMIN C) 500 MG tablet Take 500 mg by mouth 2 (two) times daily.   Yes [provider]  atorvastatin (LIPITOR) 10 MG tablet TAKE 1 TABLET DAILY 11/28/22  Yes Jake Bathe, MD  B Complex Vitamins (VITAMIN B COMPLEX) CAPS Take 1 capsule by  mouth daily.   Yes [provider]  calcium carbonate (OSCAL) 1500 (600 Ca) MG TABS tablet Take 1,500 mg by mouth 2 (two) times daily with a meal.   Yes [provider]  diltiazem (CARDIZEM) 60 MG tablet TAKE 1 TABLET THREE TIMES A DAY 11/28/22  Yes Jake Bathe, MD  docusate sodium (COLACE) 100 MG capsule Take 100 mg by mouth 2 (two) times daily.   Yes [provider]  furosemide (LASIX) 20 MG tablet Take 1 tablet (20 mg total) by mouth daily. 01/31/23  Yes Jake Bathe, MD  metoprolol tartrate (LOPRESSOR) 100 MG tablet TAKE 1 TABLET TWICE A DAY 11/28/22  Yes Jake Bathe, MD  Multiple  Vitamins-Minerals (MULTIVITAMIN WITH MINERALS) tablet Take 1 tablet by mouth daily.   Yes [provider]  omeprazole (PRILOSEC) 10 MG capsule Take 10 mg by mouth every other day. 03/21/19  Yes [provider]  polyethylene glycol (MIRALAX / GLYCOLAX) 17 g packet Take 17 g by mouth daily.   Yes [provider]  Probiotic Product (UP4 PROBIOTICS WOMENS PO) Take 1 capsule by mouth daily with breakfast.   Yes [provider]    Physical Exam: Vitals:   03/23/23 1430 03/23/23 1500 03/23/23 1513 03/23/23 1647  BP: (!) 152/76 (!) 163/80  (!) 152/65  Pulse: 80 94  (!) 107  Resp: (!) 32 (!) 31  (!) 25  Temp:   98.3 F (36.8 C) 99 F (37.2 C)  TempSrc:   Oral Oral  SpO2: 100% 99%  96%  Weight:    59 kg  Height:    4\' 11"  (1.499 m)   General appearance: Elderly adult female, frail-appearing, appears to be out of breath, responds appropriately to questions, eye contact, dress and hygiene appropriate  HEENT:  Anicteric, conjunctivae and sclerae normal without injection or icterus, lids and lashes normal.  Visual tracking smooth.  OP moist without lesions, dentition normal, lips normal, normal auditory acuity   Cor: Tachycardic, irregular, no murmurs, JVP elevated, 2+ lower extremity edema to the knees Resp: Tachypneic, lung sounds diminished on the left,  no rales appreciated, no wheezing.  Overall severely diminished Abd: Moderate tenderness to palpation in the right, no rigidity or rebound in all quadrants, no obvious masses or organomegaly MSK: Generally diminished muscle mass and fat, no gross deformities  Skin:  cap refill normal, Skin intact without significant rashes or lesions. Neuro:  Speech is fluent.  Naming is grossly intact, patient's recall, both recent and remote, seem within normal limits.  Muscle tone diminished, generalized weakness is noted, face symmetric  Psych:  Attention span and concentration are within normal limits.  Affect normal.  appropriate thought content and normal rate of speech, thought process linear           Data Reviewed: Discussed with urology Basic metabolic panel shows stable renal function, normal electrolytes CBC shows leukocytosis Lactic acid normal INR 1.3, consistent with Eliquis use BNP 555, close to baseline Lipase normal Troponin negative COVID, flu, RSV negative Urinalysis shows white cells Chest x-ray with coarse infiltrates CTA chest shows no PE, does show mild pulmonary edema and effusions CT abdomen and pelvis shows left hydronephrosis, bladder and rectal prolapse    Assessment and Plan: * Acute on chronic diastolic CHF (congestive heart failure) (HCC) Echo last Dec showed EF 50-55%.  No significant valvular disease.  Here, with dyspnea, hypoxia, LE edema, pulmonary edema and small effusions. -Furosemide 40 mg IV  -K supplement -Strict I/Os, daily weights, telemetry  -Daily monitoring renal function     Acute cystitis with hydronephrosis due to indwelling foley catheter Last culture in our system was Rocephin susceptible E coli.  Two weeks ago had urine culture at Dr. McDiarmid's office, which grew MRSA.  They prescribed nitrofurantoin, which she didn't start until 2 days ago.    Here, has increased hematuria in the last week, fever/leukocytosis, and imaging findings of  bladder inflammation.  Discussed hydronephrosis with Urology.  They will see, but suspect obstruction is from prolapse.  Recommend Gyn consultation. - Start vancomycin - Continue Rocephin - Follow culture data - Consult Urology, appreciate expertise - Consult Ob-Gyn for prolapse reduction      CKD (  chronic kidney disease), stage IIIb (HCC) Cr stable in baseline range 1.1-1.4  Malnutrition of moderate degree As evidenced by moderaet loss of subcutaneous muscle mass and fat  History of stroke - Continue Eliquis, Lipitor  Essential hypertension BP elevated - Continue diltiazem and metoprolol  Hyperlipidemia - Continue Lipitor  Permanent atrial fibrillation (HCC) Tachycardic - Continue Eliquis - Continue metoprolol and diltiazem  Pulmonary HTN (HCC) See above         Advance Care Planning: Full code, discussed with patient, husband, and daughter  Consults: Urology, OB/GYN  Family Communication: Husband and daughter at the bedside  Severity of Illness: The appropriate patient status for this patient is INPATIENT. Inpatient status is judged to be reasonable and necessary in order to provide the required intensity of service to ensure the patient's safety. The patient's presenting symptoms, physical exam findings, and initial radiographic and laboratory data in the context of their chronic comorbidities is felt to place them at high risk for further clinical deterioration. Furthermore, it is not anticipated that the patient will be medically stable for discharge from the hospital within 2 midnights of admission.   * I certify that at the point of admission it is my clinical judgment that the patient will require inpatient hospital care spanning beyond 2 midnights from the point of admission due to high intensity of service, high risk for further deterioration and high frequency of surveillance required.*  Author: Alberteen Sam, MD 03/23/2023 6:07 PM  For on  call review [email or web address].

## 2023-03-23 NOTE — Assessment & Plan Note (Signed)
Cr stable in baseline range 1.1-1.4

## 2023-03-23 NOTE — Progress Notes (Signed)
Plan of Care Note for accepted transfer   Patient: Adrienne [name] MRN: [id number]   DOA: 03/23/2023  Facility requesting transfer: [facility] Requesting Provider: Dr. Rush Landmark Reason for transfer: CHF, UTI, abd pain Facility course: History of colonic HFpEF, A-fib on Eliquis, presented with multiple complaints including lower abdominal pain acute on chronic, shortness of breath.  Workup found patient appears to be fluid overloaded with bilateral crackles and 2+ edema and elevated proBNP.  CTA negative for PE but bilateral pleural effusion, lower abdomen/pelvis pending including rectal prolapse and bladder prolapse.  ED physician did a rectal exam and did not palpated any rectal prolapse.  But appears to have a chronic vaginal prolapse and possibly bladder prolapse both of which patient claims as chronic conditions and she is to follow to see urology/OB next week.  UA appears to be UTI which patient had already taken antibiotics.  I asked ED physician to probably start IV Lasix, patient now is on 2 L.  Plan of care: The patient is accepted for admission to Telemetry unit, at [facility]..    Author: Emeline General, MD 03/23/2023  Check [email or web address] for on-call coverage.  Nursing staff, Please call [facility] Admits & Consults System-Wide number on Amion as soon as patient's arrival, so appropriate admitting provider can evaluate the pt.

## 2023-03-23 NOTE — Assessment & Plan Note (Addendum)
HR controlled now - Continue Eliquis - Continue metoprolol and diltiazem

## 2023-03-23 NOTE — Progress Notes (Signed)
   03/23/23 2005  Assess: MEWS Score  Temp 99 F (37.2 C)  BP (!) 147/53  MAP (mmHg) 81  Pulse Rate 94  ECG Heart Rate 92  Resp (!) 32  SpO2 100 %  Assess: MEWS Score  MEWS Temp 0  MEWS Systolic 0  MEWS Pulse 0  MEWS RR 2  MEWS LOC 0  MEWS Score 2  MEWS Score Color Yellow  Assess: if the MEWS score is Yellow or Red  Were vital signs taken at a resting state? Yes  Focused Assessment No change from prior assessment  Does the patient meet 2 or more of the SIRS criteria? Yes  Does the patient have a confirmed or suspected source of infection? Yes  MEWS guidelines implemented  Yes, yellow  Treat  MEWS Interventions Considered administering scheduled or prn medications/treatments as ordered  Take Vital Signs  Increase Vital Sign Frequency  Yellow: Q2hr x1, continue Q4hrs until patient remains green for 12hrs  Escalate  MEWS: Escalate Yellow: Discuss with charge nurse and consider notifying provider and/or RRT  Notify: Charge Nurse/RN  Name of Charge Nurse/RN Notified Occupational hygienist  Assess: SIRS CRITERIA  SIRS Temperature  0  SIRS Pulse 1  SIRS Respirations  1  SIRS WBC 1  SIRS Score Sum  3   Not a change in condition; patient admitted with known UTI. Yellow MEWS protocol initiated.

## 2023-03-23 NOTE — Assessment & Plan Note (Signed)
-  Continue Lipitor °

## 2023-03-23 NOTE — ED Provider Notes (Signed)
[place] EMERGENCY DEPARTMENT AT [facility] Provider Note   CSN: [id number] Arrival date & time: 03/23/23  4098     History  Chief Complaint  Patient presents with   Shortness of Breath   Nausea   Abdominal Pain    Adrienne [name] is a 89 y.o. female.  The history is provided by the patient and medical records. No language interpreter was used.  Shortness of Breath Severity:  Moderate Onset quality:  Gradual Timing:  Constant Progression:  Worsening Chronicity:  New Context: not URI   Relieved by:  Nothing Worsened by:  Nothing Ineffective treatments:  None tried Associated symptoms: abdominal pain   Associated symptoms: no chest pain, no fever, no headaches, no neck pain, no rash, no sputum production, no vomiting and no wheezing   Abdominal Pain Associated symptoms: chills, fatigue and shortness of breath   Associated symptoms: no chest pain, no constipation, no diarrhea, no dysuria (foley used), no fever, no nausea and no vomiting        Home Medications Prior to Admission medications   Medication Sig Start Date End Date Taking? Authorizing Provider  acetaminophen (TYLENOL) 500 MG tablet Take 500-1,000 mg by mouth every 12 (twelve) hours as needed (pain).     [provider]  albuterol (VENTOLIN HFA) 108 (90 Base) MCG/ACT inhaler Inhale 1 puff into the lungs every 6 (six) hours as needed for wheezing or shortness of breath (Cough). 09/08/22   Regalado, Belkys A, MD  allopurinol (ZYLOPRIM) 100 MG tablet Take 1 tablet (100 mg total) by mouth daily. 09/08/22   Regalado, Belkys A, MD  apixaban (ELIQUIS) 2.5 MG TABS tablet Take 1 tablet (2.5 mg total) by mouth 2 (two) times daily. 10/07/22   Jake Bathe, MD  ascorbic acid (VITAMIN C) 500 MG tablet Take 500 mg by mouth 2 (two) times daily.    [provider]  atorvastatin (LIPITOR) 10 MG tablet TAKE 1 TABLET DAILY 11/28/22   Jake Bathe, MD  B Complex Vitamins (VITAMIN B COMPLEX)  CAPS Take 1 capsule by mouth daily.    [provider]  calcium carbonate (OSCAL) 1500 (600 Ca) MG TABS tablet Take 1,500 mg by mouth 2 (two) times daily with a meal.    [provider]  diltiazem (CARDIZEM) 60 MG tablet TAKE 1 TABLET THREE TIMES A DAY 11/28/22   Jake Bathe, MD  docusate sodium (COLACE) 100 MG capsule Take 100 mg by mouth 2 (two) times daily.    [provider]  furosemide (LASIX) 20 MG tablet Take 1 tablet (20 mg total) by mouth daily. 01/31/23   Jake Bathe, MD  metoprolol tartrate (LOPRESSOR) 100 MG tablet TAKE 1 TABLET TWICE A DAY 11/28/22   Jake Bathe, MD  Multiple Vitamins-Minerals (MULTIVITAMIN WITH MINERALS) tablet Take 1 tablet by mouth daily.    [provider]  omeprazole (PRILOSEC) 10 MG capsule Take 10 mg by mouth every other day. 03/21/19   [provider]  polyethylene glycol (MIRALAX / GLYCOLAX) 17 g packet Take 17 g by mouth daily.    [provider]  Probiotic Product (UP4 PROBIOTICS WOMENS PO) Take 1 capsule by mouth daily with breakfast.    [provider]      Allergies    Valium [diazepam], Amiodarone hcl [amiodarone], Compazine [prochlorperazine], Other, Thorazine [chlorpromazine], Zometa [zoledronic acid], Ciprofloxacin, Doxycycline, Zoster vac recomb adjuvanted, and Iodinated contrast media    Review of Systems   Review of Systems  Constitutional:  Positive for chills and fatigue. Negative for fever.  HENT:  Negative for congestion.   Respiratory:  Positive for shortness of breath. Negative for sputum production, chest tightness and wheezing.   Cardiovascular:  Negative for chest pain and palpitations.  Gastrointestinal:  Positive for abdominal pain. Negative for constipation, diarrhea, nausea and vomiting.  Genitourinary:  Negative for dysuria (foley used).  Musculoskeletal:  Negative for back pain, neck pain and neck stiffness.  Skin:  Negative for rash and wound.  Neurological:   Negative for headaches.    Physical Exam Updated Vital Signs Temp 98.2 F (36.8 C) (Oral)   Resp (!) 24   LMP 09/12/1973 (Approximate)   SpO2 (!) 84%  Physical Exam Vitals and nursing note reviewed. Exam conducted with a chaperone present.  Constitutional:      General: She is not in acute distress.    Appearance: She is well-developed. She is ill-appearing. She is not toxic-appearing or diaphoretic.  HENT:     Head: Normocephalic and atraumatic.  Eyes:     Conjunctiva/sclera: Conjunctivae normal.     Pupils: Pupils are equal, round, and reactive to light.  Cardiovascular:     Rate and Rhythm: Normal rate and regular rhythm.     Heart sounds: No murmur heard. Pulmonary:     Effort: Pulmonary effort is normal. Tachypnea present. No respiratory distress.     Breath sounds: Rhonchi and rales present.  Chest:     Chest wall: No tenderness.  Abdominal:     Palpations: Abdomen is soft.     Tenderness: There is no abdominal tenderness.  Genitourinary:    Comments: Chaperone present.  Rectal exam showed normal rectum but she did have evidence of a vaginal prolapse which she reports is chronic.  Not focal tenderness.  Did not appear critically erythematous or infected initially. Musculoskeletal:        General: No swelling.     Cervical back: Neck supple.     Right lower leg: No tenderness. Edema present.     Left lower leg: No tenderness. Edema present.  Skin:    General: Skin is warm and dry.     Capillary Refill: Capillary refill takes less than 2 seconds.     Findings: No erythema.  Neurological:     General: No focal deficit present.     Mental Status: She is alert.  Psychiatric:        Mood and Affect: Mood normal.     ED Results / Procedures / Treatments   Labs (all labs ordered are listed, but only abnormal results are displayed) Labs Reviewed  COMPREHENSIVE METABOLIC PANEL - Abnormal; Notable for the following components:      Result Value   Glucose, Bld 148  (*)    Creatinine, Ser 1.08 (*)    GFR, Estimated 48 (*)    All other components within normal limits  CBC WITH DIFFERENTIAL/PLATELET - Abnormal; Notable for the following components:   WBC 18.3 (*)    RBC 3.49 (*)    Hemoglobin 11.3 (*)    HCT 34.1 (*)    Neutro Abs 15.7 (*)    Abs Immature Granulocytes 0.10 (*)    All other components within normal limits  PROTIME-INR - Abnormal; Notable for the following components:   Prothrombin Time 16.0 (*)    INR 1.3 (*)    All other components within normal limits  URINALYSIS, W/ REFLEX TO CULTURE (INFECTION SUSPECTED) - Abnormal; Notable for the following components:  Hgb urine dipstick LARGE (*)    Leukocytes,Ua MODERATE (*)    Bacteria, UA RARE (*)    All other components within normal limits  BRAIN NATRIURETIC PEPTIDE - Abnormal; Notable for the following components:   B Natriuretic Peptide 555.9 (*)    All other components within normal limits  RESP PANEL BY RT-PCR (RSV, FLU A&B, COVID)  RVPGX2  CULTURE, BLOOD (ROUTINE X 2)  CULTURE, BLOOD (ROUTINE X 2)  URINE CULTURE  LACTIC ACID, PLASMA  LACTIC ACID, PLASMA  LIPASE, BLOOD  TROPONIN I (HIGH SENSITIVITY)  TROPONIN I (HIGH SENSITIVITY)    EKG EKG Interpretation Date/Time:  Thursday March 23 2023 08:09:53 EDT Ventricular Rate:  97 PR Interval:    QRS Duration:  149 QT Interval:  408 QTC Calculation: 494 R Axis:   262  Text Interpretation: Atrial fibrillation Paired ventricular premature complexes Nonspecific IVCD with LAD Abnormal lateral Q waves Probable anteroseptal infarct, recent when compared to prior, similar appearance with  more wandering baseline. No STEMI Confirmed by Theda Belfast ([id number]) on 03/23/2023 8:26:39 AM  Radiology CT ABDOMEN PELVIS W CONTRAST  Result Date: 03/23/2023 CLINICAL DATA:  Acute nonlocalized abdominal pain, nausea. Fever, wheezing, hypoxia, dyspnea. EXAM: CT ANGIOGRAPHY CHEST CT ABDOMEN AND PELVIS WITH CONTRAST TECHNIQUE: Multidetector CT  imaging of the chest was performed using the standard protocol during bolus administration of intravenous contrast. Multiplanar CT image reconstructions and MIPs were obtained to evaluate the vascular anatomy. Multidetector CT imaging of the abdomen and pelvis was performed using the standard protocol during bolus administration of intravenous contrast. RADIATION DOSE REDUCTION: This exam was performed according to the departmental dose-optimization program which includes automated exposure control, adjustment of the mA and/or kV according to patient size and/or use of iterative reconstruction technique. CONTRAST:  60mL OMNIPAQUE IOHEXOL 350 MG/ML SOLN COMPARISON:  None Available. FINDINGS: CTA CHEST FINDINGS Cardiovascular: There is adequate opacification of the pulmonary arterial tree. There is no intraluminal filling defect identified to suggest acute pulmonary embolism. Global cardiac size is at the upper limits of normal with mild enlargement of the right atrium. Moderate multi-vessel coronary artery calcification noted. Mild calcification of the aortic valve leaflets noted. No pericardial effusion. The thoracic aorta is of normal caliber. Moderate atherosclerotic calcification noted within the thoracic aorta. Mediastinum/Nodes: Visualized thyroid is unremarkable. No pathologic thoracic adenopathy. The esophagus is unremarkable. Lungs/Pleura: Small bilateral pleural effusions are present. No pneumothorax. Subpleural fibrotic changes within the anterior left upper lobe and lingula likely reflect the sequela of prior conservative breast therapy. Trace interstitial pulmonary edema is present with development of mild smooth interlobular septal thickening best appreciated at the lung apices and lung bases. Superimposed ground-glass opacity likely reflects atelectasis on this expiratory phase examination. Mean 9 mm nodule within the subpleural right lower lobe along the right hemidiaphragm and axial image # 115/5 is  decreased in size since remote prior examination of 06/29/2020 and is safely considered benign, likely the sequela of remote inflammation. Musculoskeletal: No acute bone abnormality. Remote T12 inferior endplate fracture with mild loss of height. No lytic or blastic bone lesion. Osseous structures are age-appropriate. Review of the MIP images confirms the above findings. CT ABDOMEN and PELVIS FINDINGS Hepatobiliary: No focal liver abnormality is seen. Status post cholecystectomy. No biliary dilatation. Pancreas: Unremarkable. No pancreatic ductal dilatation or surrounding inflammatory changes. Spleen: Unremarkable Adrenals/Urinary Tract: There is severe pelvic floor laxity with prolapse of the anterior compartment with the base of bladder approximately 8 cm below the pubococcygeal line. Foley catheter balloon  is seen within a decompressed bladder lumen. There is narrowing of the distal left ureter best seen on axial image # 76-77, series # 2 which may relate to extrinsic compression by marked anterior compartment prolapse or an intrinsic stricture. This results in severe left hydronephrosis and hydroureter and delayed left renal cortical enhancement related to obstructive uropathy. 2 mm nonobstructing calculus noted within the interpolar region of the left kidney. No additional intrarenal or ureteral calculi are identified. The right kidney appears mildly malrotated within the anterior-posterior dimension, but is otherwise unremarkable. No right-sided hydronephrosis identified. Stomach/Bowel: There is severe posterior compartment descent with prolapse of the rectum and the anorectal junction approximately 9.9 cm below the pubococcygeal line. There is marked perirectal edema involving the prolapsed rectum, best appreciated on sagittal image # 60/6. There is no evidence of obstruction. Severe distal colonic diverticulosis without superimposed acute inflammatory change. The stomach, small bowel, and large bowel are  otherwise unremarkable. No free intraperitoneal gas or fluid. Status post appendectomy. Vascular/Lymphatic: Moderate aortoiliac atherosclerotic calcification with particularly prominent atherosclerotic calcification at the renal ostia bilaterally though the degree of stenosis is not well assessed on this non arteriographic study. No aortic aneurysm. No pathologic adenopathy within the abdomen and pelvis. Reproductive: Status post hysterectomy.  No adnexal masses. Other: None significant Musculoskeletal: No acute bone abnormality. No lytic or blastic bone lesion. Review of the MIP images confirms the above findings. IMPRESSION: 1. No pulmonary embolism. 2. Small bilateral pleural effusions with trace interstitial pulmonary edema. Together, the findings may reflect changes of mild cardiogenic failure. 3. Moderate multi-vessel coronary artery calcification. 4. Mild calcification of the aortic valve leaflets. Echocardiographic correlation for evaluation of potential aortic stenosis may be helpful for further evaluation. 5. Severe posterior compartment descent with prolapse of the rectum and the anorectal junction approximately 9.9 cm below the pubococcygeal line. Marked perirectal edema involving the prolapsed rectum, possibly reflecting a component of proctitis. No evidence of obstruction. 6. Severe pelvic floor laxity with prolapse of the anterior compartment with the base of bladder approximately 8 cm below the pubococcygeal line. Narrowing of the distal left ureter which may relate to extrinsic compression by marked anterior compartment prolapse or an intrinsic stricture. This results in severe left hydronephrosis and hydroureter and delayed left renal cortical enhancement related to obstructive uropathy. 7. 2 mm nonobstructing left renal calculus. 8. Severe distal colonic diverticulosis without superimposed acute inflammatory change. Aortic Atherosclerosis (ICD10-I70.0). Electronically Signed   By: Helyn Numbers  M.D.   On: 03/23/2023 09:51   CT Angio Chest PE W and/or Wo Contrast  Result Date: 03/23/2023 CLINICAL DATA:  Acute nonlocalized abdominal pain, nausea. Fever, wheezing, hypoxia, dyspnea. EXAM: CT ANGIOGRAPHY CHEST CT ABDOMEN AND PELVIS WITH CONTRAST TECHNIQUE: Multidetector CT imaging of the chest was performed using the standard protocol during bolus administration of intravenous contrast. Multiplanar CT image reconstructions and MIPs were obtained to evaluate the vascular anatomy. Multidetector CT imaging of the abdomen and pelvis was performed using the standard protocol during bolus administration of intravenous contrast. RADIATION DOSE REDUCTION: This exam was performed according to the departmental dose-optimization program which includes automated exposure control, adjustment of the mA and/or kV according to patient size and/or use of iterative reconstruction technique. CONTRAST:  60mL OMNIPAQUE IOHEXOL 350 MG/ML SOLN COMPARISON:  None Available. FINDINGS: CTA CHEST FINDINGS Cardiovascular: There is adequate opacification of the pulmonary arterial tree. There is no intraluminal filling defect identified to suggest acute pulmonary embolism. Global cardiac size is at the upper limits of normal  with mild enlargement of the right atrium. Moderate multi-vessel coronary artery calcification noted. Mild calcification of the aortic valve leaflets noted. No pericardial effusion. The thoracic aorta is of normal caliber. Moderate atherosclerotic calcification noted within the thoracic aorta. Mediastinum/Nodes: Visualized thyroid is unremarkable. No pathologic thoracic adenopathy. The esophagus is unremarkable. Lungs/Pleura: Small bilateral pleural effusions are present. No pneumothorax. Subpleural fibrotic changes within the anterior left upper lobe and lingula likely reflect the sequela of prior conservative breast therapy. Trace interstitial pulmonary edema is present with development of mild smooth interlobular  septal thickening best appreciated at the lung apices and lung bases. Superimposed ground-glass opacity likely reflects atelectasis on this expiratory phase examination. Mean 9 mm nodule within the subpleural right lower lobe along the right hemidiaphragm and axial image # 115/5 is decreased in size since remote prior examination of 06/29/2020 and is safely considered benign, likely the sequela of remote inflammation. Musculoskeletal: No acute bone abnormality. Remote T12 inferior endplate fracture with mild loss of height. No lytic or blastic bone lesion. Osseous structures are age-appropriate. Review of the MIP images confirms the above findings. CT ABDOMEN and PELVIS FINDINGS Hepatobiliary: No focal liver abnormality is seen. Status post cholecystectomy. No biliary dilatation. Pancreas: Unremarkable. No pancreatic ductal dilatation or surrounding inflammatory changes. Spleen: Unremarkable Adrenals/Urinary Tract: There is severe pelvic floor laxity with prolapse of the anterior compartment with the base of bladder approximately 8 cm below the pubococcygeal line. Foley catheter balloon is seen within a decompressed bladder lumen. There is narrowing of the distal left ureter best seen on axial image # 76-77, series # 2 which may relate to extrinsic compression by marked anterior compartment prolapse or an intrinsic stricture. This results in severe left hydronephrosis and hydroureter and delayed left renal cortical enhancement related to obstructive uropathy. 2 mm nonobstructing calculus noted within the interpolar region of the left kidney. No additional intrarenal or ureteral calculi are identified. The right kidney appears mildly malrotated within the anterior-posterior dimension, but is otherwise unremarkable. No right-sided hydronephrosis identified. Stomach/Bowel: There is severe posterior compartment descent with prolapse of the rectum and the anorectal junction approximately 9.9 cm below the pubococcygeal  line. There is marked perirectal edema involving the prolapsed rectum, best appreciated on sagittal image # 60/6. There is no evidence of obstruction. Severe distal colonic diverticulosis without superimposed acute inflammatory change. The stomach, small bowel, and large bowel are otherwise unremarkable. No free intraperitoneal gas or fluid. Status post appendectomy. Vascular/Lymphatic: Moderate aortoiliac atherosclerotic calcification with particularly prominent atherosclerotic calcification at the renal ostia bilaterally though the degree of stenosis is not well assessed on this non arteriographic study. No aortic aneurysm. No pathologic adenopathy within the abdomen and pelvis. Reproductive: Status post hysterectomy.  No adnexal masses. Other: None significant Musculoskeletal: No acute bone abnormality. No lytic or blastic bone lesion. Review of the MIP images confirms the above findings. IMPRESSION: 1. No pulmonary embolism. 2. Small bilateral pleural effusions with trace interstitial pulmonary edema. Together, the findings may reflect changes of mild cardiogenic failure. 3. Moderate multi-vessel coronary artery calcification. 4. Mild calcification of the aortic valve leaflets. Echocardiographic correlation for evaluation of potential aortic stenosis may be helpful for further evaluation. 5. Severe posterior compartment descent with prolapse of the rectum and the anorectal junction approximately 9.9 cm below the pubococcygeal line. Marked perirectal edema involving the prolapsed rectum, possibly reflecting a component of proctitis. No evidence of obstruction. 6. Severe pelvic floor laxity with prolapse of the anterior compartment with the base of bladder approximately 8  cm below the pubococcygeal line. Narrowing of the distal left ureter which may relate to extrinsic compression by marked anterior compartment prolapse or an intrinsic stricture. This results in severe left hydronephrosis and hydroureter and  delayed left renal cortical enhancement related to obstructive uropathy. 7. 2 mm nonobstructing left renal calculus. 8. Severe distal colonic diverticulosis without superimposed acute inflammatory change. Aortic Atherosclerosis (ICD10-I70.0). Electronically Signed   By: Helyn Numbers M.D.   On: 03/23/2023 09:51   DG Chest Portable 1 View  Result Date: 03/23/2023 CLINICAL DATA:  hypoxia, SOB, abd pain, nausea EXAM: PORTABLE CHEST 1 VIEW COMPARISON:  02/04/2023. FINDINGS: Redemonstration of increased bronchovascular markings throughout bilateral lungs, likely age-related. No frank pulmonary edema. No consolidation or major atelectasis. Bilateral costophrenic angles are clear. Stable cardio-mediastinal silhouette. Aortic knob calcifications noted. No acute osseous abnormalities. The soft tissues are within normal limits. There are surgical staples in the left breast/axillary region. IMPRESSION: 1. No active disease. 2. Aortic atherosclerosis. Aortic Atherosclerosis (ICD10-I70.0). Electronically Signed   By: Jules Schick M.D.   On: 03/23/2023 08:54    Procedures Procedures    CRITICAL CARE Performed by: Canary Brim Yassmine Tamm Total critical care time: 3 minutes Critical care time was exclusive of separately billable procedures and treating other patients. Critical care was necessary to treat or prevent imminent or life-threatening deterioration. Critical care was time spent personally by me on the following activities: development of treatment plan with patient and/or surrogate as well as nursing, discussions with consultants, evaluation of patient's response to treatment, examination of patient, obtaining history from patient or surrogate, ordering and performing treatments and interventions, ordering and review of laboratory studies, ordering and review of radiographic studies, pulse oximetry and re-evaluation of patient's condition.  Medications Ordered in ED Medications  iohexol (OMNIPAQUE) 350  MG/ML injection 100 mL (60 mLs Intravenous Contrast Given 03/23/23 0911)  cefTRIAXone (ROCEPHIN) 1 g in sodium chloride 0.9 % 100 mL IVPB (0 g Intravenous Stopped 03/23/23 1246)    ED Course/ Medical Decision Making/ A&P                             Medical Decision Making Amount and/or Complexity of Data Reviewed Labs: ordered. Radiology: ordered.  Risk Prescription drug management. Decision regarding hospitalization.    Adrienne [name] is a 89 y.o. female with a past medical history significant for atrial fibrillation on Eliquis therapy, previous stroke, hypertension, hyperlipidemia, CAD, previous GI bleeding, breast cancer, previous appendectomy/gallbladder surgery, and pulmonary hypertension who presents with abdominal pain, nausea, shortness of breath, and hypoxia.  According to patient, she was doing well the last few days but has noticed some recent increase in her peripheral edema in her legs.  She reports it is chronic but it has worsened.  She reports that she woke this morning and started having moderate abdominal discomfort and felt she could not breathe.  She was gasping and on arrival to the emergency department her oxygen saturation was around 80%.  She does not take oxygen normally at home.  She reports no chest pain but is having chest tightness and shortness of breath.  She was having nausea but no vomiting.  She is still having abdominal discomfort primarily on the left side.  No reported constipation or diarrhea and she did have a normal bowel movement this morning.  Denies dysuria or hematuria.  Denies fevers, chills, congestion, or new cough.  On arrival, patient is tachycardic, tachypneic, and hypoxic on  room air.  She was placed on oxygen and oxygen saturations are now in the 90s.  She is afebrile but is warm to the touch, will get rectal temp.  On exam, lungs did seem slightly diminished but did not have significant wheezing.  Possible faint rales.  No rhonchi  initially.  Chest nontender and I did not appreciate a loud murmur initially.  Abdomen is tender to palpation primarily in the left lower quadrant and central abdomen.  Bowel sounds were appreciated.  Flanks and back nontender.  Legs are quite edematous bilaterally but it had intact sensation strength throughout her body.  Symmetric smile.  Clear speech.  Pupils symmetric and reactive with normal extraocular movements.  Clinically I am concerned about multiple etiology of the patient's symptoms however with her new hypoxia she will likely need admission.  Will get chest x-ray.  Although she is on Eliquis and reports he is compliant, with her history of cancer and this sudden onset tachycardia, tachypnea, and hypoxia, feel like she cannot take a deep breath or breathe, I do feel that a thromboembolic etiology should be considered.  Will get CT PE study and CT abdomen pelvis given her new abdominal discomfort previous abdominal surgeries.  Chart review does show the patient had a low reaction to contrast in the past with some anxiety, we discussed this and patient is agreeable to getting the CT scans and treating any symptoms she may have after.  Patient will also get screening labs, urinalysis, and further workup.  Will make sure she does not have COVID as it has been in the community again.  Also of note, patient is chronically having a Foley catheter in place and has been on Macrobid for 2 days for possible UTI.  Bag was changed and urine sample will be collected.  She just had her Foley changed within the last 2 weeks.  Anticipate admission after workup is completed.  11:50 AM Workup continues to return.  Patient does have a leukocytosis of 18 that is worse than prior.  COVID flu and RSV negative.  Troponin negative.  Lactic acid not elevated, doubt sepsis.  Urinalysis does show leukocytes and bacteria and given her report of the abdominal discomfort and now fever and currently being on antibiotics for  UTI I suspect this is likely the source of her troubles.  Her metabolic panel was not critically abnormal but her BNP was elevated higher than before at 555.9.  I suspect some of the hypoxia is due to fluid overload  and fluid in the lungs.  She did have a PE study that revealed pleural effusions and pulmonary edema.  No pulmonary embolism or large pneumonia seen.  CT scan also showed some pelvic floor laxity.  I did a rectal exam and she does not have any rectal prolapse at this time.  She reports she has had prolapse of her vagina for years and she is scheduled to see her OB/GYN soon and this does not feel any different than normal to her.  Due to the new hypoxia, fluid overload, and evidence of UTI, will call for admission.  During her admission, admitting team may decide to speak with urology given the bladder abnormalities on CT but given her lack of abscess or pyelonephritis initially we will hold on calling them.  Will call for admission.         Final Clinical Impression(s) / ED Diagnoses Final diagnoses:  Hypoxia  Hypervolemia, unspecified hypervolemia type  Urinary tract infection without hematuria,  site unspecified    Clinical Impression: 1. Hypoxia   2. Hypervolemia, unspecified hypervolemia type   3. Urinary tract infection without hematuria, site unspecified     Disposition: Admit  This note was prepared with assistance of Dragon voice recognition software. Occasional wrong-word or sound-a-like substitutions may have occurred due to the inherent limitations of voice recognition software.     Lochlyn Zullo, Canary Brim, MD 03/23/23 1601

## 2023-03-23 NOTE — Consult Note (Signed)
   OB/GYN Telephone Consult  03/23/2023   Adrienne [name] is a 89 y.o. [phone] who is currently postmenopausal presenting to [facility] currently admitted to hospitalist team.   I was called for a consult regarding the care of this patient by the Physician (MD/DO) caring for the patient.   The provider had the following clinical question: Should patient have pessary placed in light of ureteral kinking likely secondary to significant anterior compartment prolapse  The provider presented the following relevant clinical information: 89 yo presented with abdominal pain, N/V, wheezing and fever and noted to have MRSA UTI in the setting of chronic indwelling catheter. CT A/P obtained on initial workup shows bladder prolapse with likely kinking of left ureter and subsequent hydronephrosis.   I performed a chart review on the patient and reviewed available documentation.  BP (!) 152/65 (BP Location: Left Arm)   Pulse (!) 107   Temp 99 F (37.2 C) (Oral)   Resp (!) 25   Ht 4\' 11"  (1.499 m)   Wt 59 kg   LMP 09/12/1973 (Approximate)   SpO2 96%   BMI 26.27 kg/m   Exam- performed by consulting provider   Per chart review:  Longstanding hx of uterine procidentia requiring indwelling urinary foley cathter previously managed by pessary. Noted to have significant vaginal bleeding from vaginal wall laceration/ulceration by the pessary due to significant vaginal atrophy with concomitant anticoagulation. Vaginal bleeding required blood transfusion and repair of vaginal wall laceration in the OR. Not on vaginal estrogen and decision made at that time to remove pessary and continue with surveillance and indwelling catheter  Patient creatine at baseline   Recommendations:  -continue with planned treatment for MRSA UTI -does not appear to be acute change in vaginal prolapse with no change in creatinine; has follow up scheduled w/ Dr. Edward Jolly on 7/24 for routine annual and can re-visit  discussion re: pessary at that time given hx of   Thank you for this consult and if additional recommendations are needed please call [phone] for the OB/GYN attending on service at [facility].   I spent approximately 10 minutes directly consulting with the provider and verbally discussing this case. Additionally 10 minutes minutes was spent performing chart review and documentation.    Lorriane Shire, MD

## 2023-03-23 NOTE — Plan of Care (Signed)

## 2023-03-23 NOTE — Assessment & Plan Note (Addendum)
BP normal - Continue diltiazem and metoprolol

## 2023-03-24 DIAGNOSIS — N1330 Unspecified hydronephrosis: Secondary | ICD-10-CM | POA: Insufficient documentation

## 2023-03-24 DIAGNOSIS — I5033 Acute on chronic diastolic (congestive) heart failure: Secondary | ICD-10-CM | POA: Diagnosis not present

## 2023-03-24 LAB — BASIC METABOLIC PANEL
Anion gap: 8 (ref 5–15)
BUN: 16 mg/dL (ref 8–23)
CO2: 29 mmol/L (ref 22–32)
Calcium: 8.4 mg/dL — ABNORMAL LOW (ref 8.9–10.3)
Chloride: 98 mmol/L (ref 98–111)
Creatinine, Ser: 1.05 mg/dL — ABNORMAL HIGH (ref 0.44–1.00)
GFR, Estimated: 50 mL/min — ABNORMAL LOW (ref >60)
Glucose, Bld: 103 mg/dL — ABNORMAL HIGH (ref 70–99)
Potassium: 3.7 mmol/L (ref 3.5–5.1)
Sodium: 135 mmol/L (ref 135–145)

## 2023-03-24 LAB — CBC
HCT: 31.3 % — ABNORMAL LOW (ref 36.0–46.0)
Hemoglobin: 10.3 g/dL — ABNORMAL LOW (ref 12.0–15.0)
MCH: 32.4 pg (ref 26.0–34.0)
MCHC: 32.9 g/dL (ref 30.0–36.0)
MCV: 98.4 fL (ref 80.0–100.0)
Platelets: 215 10*3/uL (ref 150–400)
RBC: 3.18 MIL/uL — ABNORMAL LOW (ref 3.87–5.11)
RDW: 14.6 % (ref 11.5–15.5)
WBC: 17.9 10*3/uL — ABNORMAL HIGH (ref 4.0–10.5)
nRBC: 0 % (ref 0.0–0.2)

## 2023-03-24 LAB — URINE CULTURE: Culture: <10000 — AB

## 2023-03-24 MED ORDER — FUROSEMIDE 20 MG PO TABS: 20.0000 mg | ORAL_TABLET | Freq: Every day | ORAL | Status: DC

## 2023-03-24 MED ORDER — FUROSEMIDE 10 MG/ML IJ SOLN
20.0000 mg | Freq: Once | INTRAMUSCULAR | Status: AC
  Administered 2023-03-24: 20 mg via INTRAVENOUS
  Filled 2023-03-24: qty 2

## 2023-03-24 MED ORDER — POTASSIUM CHLORIDE CRYS ER 20 MEQ PO TBCR
20.0000 meq | EXTENDED_RELEASE_TABLET | Freq: Once | ORAL | Status: AC
  Administered 2023-03-24: 20 meq via ORAL
  Filled 2023-03-24: qty 1

## 2023-03-24 NOTE — Plan of Care (Signed)
  Problem: Activity: Goal: Risk for activity intolerance will decrease Outcome: Progressing   Problem: Nutrition: Goal: Adequate nutrition will be maintained Outcome: Progressing   Problem: Coping: Goal: Level of anxiety will decrease Outcome: Progressing   

## 2023-03-24 NOTE — Progress Notes (Signed)
Heart Failure Navigator Progress Note  Assessed for Heart & Vascular TOC clinic readiness.  Patient with a EF of 50-55%, is scheduled for a [facility] appointment on 04/17/2023 @ 10:05 am. No HF TOC at this time. .   Navigator will sign off at this time.   Rhae Hammock, BSN, Scientist, clinical (histocompatibility and immunogenetics) Only

## 2023-03-24 NOTE — Progress Notes (Signed)
SATURATION QUALIFICATIONS: (This note is used to comply with regulatory documentation for home oxygen)  Patient Saturations on Room Air at Rest = 95%  Patient Saturations on Room Air while Ambulating = 88%  Patient Saturations on 2 Liters of oxygen while Ambulating = 93%  Please briefly explain why patient needs home oxygen: Patient' O2 sats dropped to 88% while ambulating.

## 2023-03-24 NOTE — Progress Notes (Signed)
Reviewed notes.  Spoke to nurse practitioner.  Reviewed gynecology notes.  Patient has complete vaginal vault prolapse.  She is not a surgical candidate.  She has had a lot of ulcerations and cannot have another pessary.  She has significant left-sided hydronephrosis that is otherwise asymptomatic without pain.  In my opinion hydronephrosis due to prolapse.  Serum creatinine and glomerular filtration rate is stable.  Patient had a positive urine culture with MRSA on March 11, 2023.  It was sensitive to Macrodantin gentamicin and sulfa doxycycline and vancomycin.  We changed her catheter every month and this could represent colonization.  I had called in a prescription of Macrodantin and I am not certain if this was ever taken.  She sometimes has blood in her urine.  I do not think she needs another cystoscopy at this stage.  The patient does not need a stent or percutaneous tube unless she was having left-sided flank pain.  A stent would also be challenging based upon the length and would need to be changed twice a year.  We will continue to follow.

## 2023-03-24 NOTE — Progress Notes (Signed)
Progress Note   Patient: Adrienne [name] [id number]:[id number] DOB: Mar 15, 1928 DOA: 03/23/2023     1 DOS: the patient was seen and examined on 03/24/2023 at 9:30 AM      Brief hospital course: Adrienne [name] is a 89 y.o. F with severe bladder prolapse and chronic indwelling foley, cAF on Eliquis, dCHF, pHTN, CAD, CKD IIIb baseline 1.1-1.4, and hx CVA admitted for CHF flare and complicated MRSA UTI.       Assessment and Plan: * Acute on chronic diastolic CHF (congestive heart failure) (HCC) Presented with 2 weeks progressive LE swelling, weight up from 120 to 130 lbs.    Here, with dyspnea, hypoxia, LE edema, pulmonary edema and small effusions.  Echo last Dec showed EF 50-55%.  No significant valvular disease.    Net negative 1L yesterday, leg swelling better, still on o2 and still dyspneic. - Repeat IV furoesmide, 20 mg   - K supplement - Strict I/Os, daily weights, telemetry  - Daily monitoring renal function     Acute cystitis with hydronephrosis due to indwelling foley catheter 2 weeks prior to admission had routine foley exchange with Dr. McDiarmid.  Culture at that time grew MRSA, unfortunately, patient did not become aware of this until 2-3 days PTA, at which time she started nitrofurantoin.  - Continue vancomycin, Rocephin - Follow culture data    Hydronephrosis of left kidney Discussed with Urology and OB-Gyn who were asked to see patient in consultation.  The consensus is that this is caused by her prolapse, which is chronic, and thankfully not affecting renal function.    With Eliquis, no role for stenting or PCN (not that the former would help a prolapse).  She failed pessary before, nor would this be possible in the hospital.   - Conservative mgmt - Close monitoring renal function - Outpatient Gyn follow up    CKD (chronic kidney disease), stage IIIb (HCC) Cr stable in baseline range 1.1-1.4  Malnutrition of moderate degree As evidenced by moderaet  loss of subcutaneous muscle mass and fat  History of stroke - Continue Eliquis, Lipitor  Essential hypertension BP normal - Continue diltiazem and metoprolol  Hyperlipidemia - Continue Lipitor  Permanent atrial fibrillation (HCC) HR controlled now - Continue Eliquis - Continue metoprolol and diltiazem  Pulmonary HTN (HCC) See above          Subjective: She is feeling somewhat better today, appetite is okay, she is sleeping.  Breathing feels somewhat better, but still little bit short of breath.  No further fever, no confusion.  No nursing concerns.     Physical Exam: BP (!) 126/54 (BP Location: Left Arm)   Pulse 78   Temp 97.6 F (36.4 C) (Oral)   Resp 20   Ht 4\' 11"  (1.499 m)   Wt 59.7 kg   LMP 09/12/1973 (Approximate)   SpO2 100%   BMI 26.58 kg/m   Frail elderly female, lying in bed, no acute distress Heart rate normal, irregular rhythm, no murmurs, lower extremity edema improved, now mild, JVP better Respiratory rate still elevated, still appears out of breath with talking, lung sounds diminished Abdomen soft without tenderness palpation Attention normal, affect appropriate, judgment and insight appear normal  Data Reviewed: Basic metabolic panel shows renal function is stable, sodium potassium normal CBC shows leukocytosis persist, hemoglobin 10.3  Family Communication: Husband at the bedside    Disposition: Status is: Inpatient Patient was admitted with congestive heart failure and complicated urinary tract infection  Her congestive heart failure  slightly better, but she requires ongoing IV Lasix and she is still on oxygen  We await urine cultures        Author: Alberteen Sam, MD 03/24/2023 10:09 AM  For on call review [email or web address].

## 2023-03-24 NOTE — Assessment & Plan Note (Signed)
Discussed with Urology and OB-Gyn who were asked to see patient in consultation.  The consensus is that this is caused by her prolapse, which is chronic, and thankfully not affecting renal function.    With Eliquis, no role for stenting or PCN (not that the former would help a prolapse).  She failed pessary before, nor would this be possible in the hospital.   - Conservative mgmt - Close monitoring renal function - Outpatient Gyn follow up

## 2023-03-25 ENCOUNTER — Other Ambulatory Visit (HOSPITAL_COMMUNITY): Payer: Self-pay

## 2023-03-25 DIAGNOSIS — I5033 Acute on chronic diastolic (congestive) heart failure: Secondary | ICD-10-CM | POA: Diagnosis not present

## 2023-03-25 LAB — COMPREHENSIVE METABOLIC PANEL
ALT: 16 U/L (ref 0–44)
AST: 15 U/L (ref 15–41)
Albumin: 2.5 g/dL — ABNORMAL LOW (ref 3.5–5.0)
Alkaline Phosphatase: 62 U/L (ref 38–126)
Anion gap: 6 (ref 5–15)
BUN: 24 mg/dL — ABNORMAL HIGH (ref 8–23)
CO2: 29 mmol/L (ref 22–32)
Calcium: 8.5 mg/dL — ABNORMAL LOW (ref 8.9–10.3)
Chloride: 98 mmol/L (ref 98–111)
Creatinine, Ser: 1.2 mg/dL — ABNORMAL HIGH (ref 0.44–1.00)
GFR, Estimated: 42 mL/min — ABNORMAL LOW (ref >60)
Glucose, Bld: 103 mg/dL — ABNORMAL HIGH (ref 70–99)
Potassium: 4.2 mmol/L (ref 3.5–5.1)
Sodium: 133 mmol/L — ABNORMAL LOW (ref 135–145)
Total Bilirubin: 0.6 mg/dL (ref 0.3–1.2)
Total Protein: 5.8 g/dL — ABNORMAL LOW (ref 6.5–8.1)

## 2023-03-25 LAB — CBC
HCT: 29.8 % — ABNORMAL LOW (ref 36.0–46.0)
Hemoglobin: 9.6 g/dL — ABNORMAL LOW (ref 12.0–15.0)
MCH: 31.3 pg (ref 26.0–34.0)
MCHC: 32.2 g/dL (ref 30.0–36.0)
MCV: 97.1 fL (ref 80.0–100.0)
Platelets: 210 10*3/uL (ref 150–400)
RBC: 3.07 MIL/uL — ABNORMAL LOW (ref 3.87–5.11)
RDW: 14.5 % (ref 11.5–15.5)
WBC: 11.9 10*3/uL — ABNORMAL HIGH (ref 4.0–10.5)
nRBC: 0 % (ref 0.0–0.2)

## 2023-03-25 MED ORDER — SULFAMETHOXAZOLE-TRIMETHOPRIM 400-80 MG PO TABS
1.0000 | ORAL_TABLET | Freq: Two times a day (BID) | ORAL | 0 refills | Status: DC
  Filled 2023-03-25: qty 19, 10d supply, fill #0

## 2023-03-25 MED ORDER — FUROSEMIDE 20 MG PO TABS: 20.0000 mg | ORAL_TABLET | Freq: Every day | ORAL | Status: DC

## 2023-03-25 MED ORDER — SULFAMETHOXAZOLE-TRIMETHOPRIM 800-160 MG PO TABS
1.0000 | ORAL_TABLET | Freq: Two times a day (BID) | ORAL | 0 refills | Status: DC
  Filled 2023-03-25: qty 19, 10d supply, fill #0

## 2023-03-25 NOTE — Progress Notes (Signed)
Pharmacy Antibiotic Note  Adrienne [name] is a 89 y.o. female admitted on 03/23/2023 with UTI, recent culture with MRSA.  Pharmacy has been consulted for vancomycin dosing. Renal function okay.   Plan: Continue Vancomycin 1g IV q48h (eAUC 515 using SCr 1.08, Vd 0.72) Check vancomycin levels at steady state, goal AUC 400-550 Follow up renal function & cultures, clinical course, length of therapy  Height: 4\' 11"  (149.9 cm) Weight: 59.7 kg (131 lb 9.8 oz) IBW/kg (Calculated) : 43.2  Temp (24hrs), Avg:98.2 F (36.8 C), Min:97.6 F (36.4 C), Max:98.6 F (37 C)  Recent Labs  Lab 03/23/23 0818 03/23/23 1100 03/24/23 0107 03/25/23 0056  WBC 18.3*  --  17.9* 11.9*  CREATININE 1.08*  --  1.05* 1.20*  LATICACIDVEN 1.5 1.1  --   --     Estimated Creatinine Clearance: 23 mL/min (A) (by C-G formula based on SCr of 1.2 mg/dL (H)).    Allergies  Allergen Reactions   Valium [Diazepam] Other (See Comments)    HYPERACTIVITY    Amiodarone Hcl [Amiodarone] Other (See Comments)    A-Fib   Compazine [Prochlorperazine] Other (See Comments)    HYPERACTIVITY    Other Other (See Comments)    "Kidney dye"--patient states this gave her severe shakes/chills   Thorazine [Chlorpromazine] Other (See Comments)    HYPERACTIVITY    Zometa [Zoledronic Acid] Other (See Comments)    PROBLEMS WITH EYES  TIA   Ciprofloxacin Swelling and Other (See Comments)    "swelling" in Rt.elbow   Doxycycline Nausea Only   Zoster Vac Recomb Adjuvanted Other (See Comments)    Welt under injection site   Iodinated Contrast Media Anxiety and Other (See Comments)    Chills and anxiety  Other Reaction(s): chills/shaking, Kidney dye    Antimicrobials this admission: 7/11 Ceftriaxone >> 7/12 Vancomycin >>  Dose adjustments this admission:  Microbiology results: 7/11 Ucx >> insignificant growth (<10K) 7/11 Bcx x2 >> ngtd x2d on 7/13  Thank you for allowing pharmacy to be a part of this patient's  care.  Loralee Pacas, PharmD, BCPS 03/25/2023 10:06 AM  Please check AMION for all [facility] Pharmacy phone numbers After 10:00 PM, call Main Pharmacy [phone]

## 2023-03-25 NOTE — Discharge Summary (Signed)
Physician Discharge Summary   Patient: Adrienne [name] MRN: [id number] DOB: April 26, 1928  Admit date:     03/23/2023  Discharge date: 03/25/23  Discharge Physician: Alberteen Sam   PCP: Sigmund Hazel, MD     Recommendations at discharge:  Follow up with GYN Dr. Edward Jolly within 1 week for pelvic prolapse with hydronephrosis Dr. Edward Jolly:  Please see CT report below regarding L hydronephrosis, suspected due to prolapse  Follow up with Urology Dr. McDiarmid in 1-2 weeks for MRSA UTI, hydronephrosis  Dr. Hyacinth Meeker:  Please obtain BMP in 1 week to follow up creatinine     Discharge Diagnoses: Principal Problem:   Acute on chronic diastolic CHF (congestive heart failure) (HCC) Active Problems:   Acute cystitis with hydronephrosis due to indwelling foley catheter   Hydronephrosis of left kidney   Pulmonary HTN (HCC)   Permanent atrial fibrillation (HCC)   Hyperlipidemia   Essential hypertension   History of stroke   Malnutrition of moderate degree   CKD (chronic kidney disease), stage IIIb [facility])      Hospital Course: Adrienne [name] is a 89 y.o. F with severe bladder prolapse and chronic indwelling foley, cAF on Eliquis, dCHF, pHTN, CAD, CKD IIIb baseline 1.1-1.4, and hx CVA admitted for CHF flare and complicated MRSA UTI.     * Acute on chronic diastolic CHF (congestive heart failure) (HCC) Presented with 2 weeks progressive LE swelling, weight up from 120 to 130 lbs.  Echo last Dec showed EF 50-55%.  No significant valvular disease.    Treated with IV Lasix, leg swelling resolved.  BUN up slightly, Lasix held.  Oral Lasix to resume at discharge, get BMP with PCP in 1 week.  Unable to wean off O2.  Discharged with new home O2.       Acute cystitis with hydronephrosis due to indwelling foley catheter 2 weeks prior to admission had routine foley exchange with Dr. McDiarmid.  Culture at that time grew MRSA.  Patient missed message from Urology, didn't start  nitrofurantoin until 2 days PTA.  In the hospital, repeat culture without growth, but CT showed stranding and hydronephrosis, and she had fever, leukocytosis wtihout other clear sources.    Treated in the hospital with vancomycin, discharged with bactrim to complete 10 days.  Needs Urology follow up       Hydronephrosis of left kidney Discussed with Urology and OB-Gyn, who agreed this was likely caused by her prolapse, which is chronic, and thankfully not affecting renal function.    Both Urology and OB-Gyn recommended outpatietn follow up for management of her hydronephrosis.    CKD (chronic kidney disease), stage IIIb (HCC) Cr stable in baseline range 1.1-1.4  Permanent atrial fibrillation (HCC) On Eliquis             The [facility] Controlled Substances Registry was reviewed for this patient prior to discharge.   Consultants: Urology, Dr. Jacinto Halim Dr. Briscoe Deutscher  Procedures performed: CT abdomen and pelvis CTA chest   Disposition: Home health Diet recommendation:  Discharge Diet Orders (From admission, onward)     Start     Ordered   03/25/23 0000  Diet - low sodium heart healthy        03/25/23 1043             DISCHARGE MEDICATION: Allergies as of 03/25/2023       Reactions   Valium [diazepam] Other (See Comments)   HYPERACTIVITY   Amiodarone Hcl [amiodarone] Other (See Comments)  A-Fib   Compazine [prochlorperazine] Other (See Comments)   HYPERACTIVITY    Other Other (See Comments)   "Kidney dye"--patient states this gave her severe shakes/chills   Thorazine [chlorpromazine] Other (See Comments)   HYPERACTIVITY    Zometa [zoledronic Acid] Other (See Comments)   PROBLEMS WITH EYES TIA   Ciprofloxacin Swelling, Other (See Comments)   "swelling" in Rt.elbow   Doxycycline Nausea Only   Zoster Vac Recomb Adjuvanted Other (See Comments)   Welt under injection site   Iodinated Contrast Media Anxiety, Other (See Comments)   Chills  and anxiety Other Reaction(s): chills/shaking, Kidney dye        Medication List     TAKE these medications    acetaminophen 500 MG tablet Commonly known as: TYLENOL Take 500-1,000 mg by mouth every 12 (twelve) hours as needed (pain).   albuterol 108 (90 Base) MCG/ACT inhaler Commonly known as: Ventolin HFA Inhale 1 puff into the lungs every 6 (six) hours as needed for wheezing or shortness of breath (Cough).   allopurinol 100 MG tablet Commonly known as: ZYLOPRIM Take 1 tablet (100 mg total) by mouth daily.   apixaban 2.5 MG Tabs tablet Commonly known as: ELIQUIS Take 1 tablet (2.5 mg total) by mouth 2 (two) times daily.   ascorbic acid 500 MG tablet Commonly known as: VITAMIN C Take 500 mg by mouth 2 (two) times daily.   atorvastatin 10 MG tablet Commonly known as: LIPITOR TAKE 1 TABLET DAILY   calcium carbonate 1500 (600 Ca) MG Tabs tablet Commonly known as: OSCAL Take 1,500 mg by mouth 2 (two) times daily with a meal.   diltiazem 60 MG tablet Commonly known as: CARDIZEM TAKE 1 TABLET THREE TIMES A DAY   docusate sodium 100 MG capsule Commonly known as: COLACE Take 100 mg by mouth 2 (two) times daily.   furosemide 20 MG tablet Commonly known as: LASIX Take 1 tablet (20 mg total) by mouth daily.   metoprolol tartrate 100 MG tablet Commonly known as: LOPRESSOR TAKE 1 TABLET TWICE A DAY   multivitamin with minerals tablet Take 1 tablet by mouth daily.   omeprazole 10 MG capsule Commonly known as: PRILOSEC Take 10 mg by mouth every other day.   polyethylene glycol 17 g packet Commonly known as: MIRALAX / GLYCOLAX Take 17 g by mouth daily.   sulfamethoxazole-trimethoprim 800-160 MG tablet Commonly known as: BACTRIM DS Take 1 tablet by mouth 2 (two) times daily.   UP4 PROBIOTICS WOMENS PO Take 1 capsule by mouth daily with breakfast.   Vitamin B Complex Caps Take 1 capsule by mouth daily.               Durable Medical Equipment  (From  admission, onward)           Start     Ordered   03/25/23 1039  DME Oxygen  Once       Question Answer Comment  Length of Need Lifetime   Mode or (Route) Nasal cannula   Liters per Minute 2   Frequency Continuous (stationary and portable oxygen unit needed)   Oxygen delivery system Gas      03/25/23 1043            Follow-up Information     McDiarmid, Leighton Roach, MD Follow up.   Specialty: Family Medicine Contact information: [address] [place] [address] [phone]         Patton Salles, MD .   Specialty:  Obstetrics and Gynecology Contact information: [address] [place] [address] [phone]         Sigmund Hazel, MD Follow up.   Specialty: Family Medicine Contact information: [address] [place] [address] [phone]                 Discharge Instructions     Diet - low sodium heart healthy   Complete by: As directed    Discharge instructions   Complete by: As directed    **IMPORTANT DISCHARGE INSTRUCTIONS**   From Dr. Maryfrances Bunnell: You were admitted for shortness of breath and leg swelling, as well as a UTI  Your chest imaging and labs showed signs of congestive heart failure (edema/fluid in the lungs, and around the lungs) You were treated with strong doses of Lasix, and this appeared to improve  You were also noted to have stranding around the bladder (on CT imaging) blood in the urine and fever, so we believe you had a urinary tract infection.  Take the antibiotic Bactrim DS twice daily starting tonight for 9 more days then stop  Go see Dr. Hyacinth Meeker within 1 week for labs  Go see Dr. McDiarmid soon.  Call his office and ask when you can get in to be seen  Call Dr. Rica Records office and ask for an appointment within 1 week  Use the oxygen at all times.  Discuss with Dr. Hyacinth Meeker if you can someday wean off of it   Increase activity slowly   Complete by: As directed         Discharge Exam: Filed Weights   03/23/23 1647 03/24/23 0431 03/25/23 0407  Weight: 59 kg 59.7 kg 59.7 kg    General: Pt is alert, awake, not in acute distress Cardiovascular: RRR, nl S1-S2, no murmurs appreciated.   Mild nonpitting LE edema.   Respiratory: Normal respiratory rate and rhythm.  CTAB without rales or wheezes.  On [place]. Abdominal: Abdomen soft and non-tender.  No distension or HSM.   Neuro/Psych: Strength symmetric in upper and lower extremities.  Judgment and insight appear normal.   Condition at discharge: stable  The results of significant diagnostics from this hospitalization (including imaging, microbiology, ancillary and laboratory) are listed below for reference.   Imaging Studies: CT ABDOMEN PELVIS W CONTRAST  Result Date: 03/23/2023 CLINICAL DATA:  Acute nonlocalized abdominal pain, nausea. Fever, wheezing, hypoxia, dyspnea. EXAM: CT ANGIOGRAPHY CHEST CT ABDOMEN AND PELVIS WITH CONTRAST TECHNIQUE: Multidetector CT imaging of the chest was performed using the standard protocol during bolus administration of intravenous contrast. Multiplanar CT image reconstructions and MIPs were obtained to evaluate the vascular anatomy. Multidetector CT imaging of the abdomen and pelvis was performed using the standard protocol during bolus administration of intravenous contrast. RADIATION DOSE REDUCTION: This exam was performed according to the departmental dose-optimization program which includes automated exposure control, adjustment of the mA and/or kV according to patient size and/or use of iterative reconstruction technique. CONTRAST:  60mL OMNIPAQUE IOHEXOL 350 MG/ML SOLN COMPARISON:  None Available. FINDINGS: CTA CHEST FINDINGS Cardiovascular: There is adequate opacification of the pulmonary arterial tree. There is no intraluminal filling defect identified to suggest acute pulmonary embolism. Global cardiac size is at the upper limits of normal with mild enlargement of the right  atrium. Moderate multi-vessel coronary artery calcification noted. Mild calcification of the aortic valve leaflets noted. No pericardial effusion. The thoracic aorta is of normal caliber. Moderate atherosclerotic calcification noted within the thoracic aorta. Mediastinum/Nodes: Visualized thyroid  is unremarkable. No pathologic thoracic adenopathy. The esophagus is unremarkable. Lungs/Pleura: Small bilateral pleural effusions are present. No pneumothorax. Subpleural fibrotic changes within the anterior left upper lobe and lingula likely reflect the sequela of prior conservative breast therapy. Trace interstitial pulmonary edema is present with development of mild smooth interlobular septal thickening best appreciated at the lung apices and lung bases. Superimposed ground-glass opacity likely reflects atelectasis on this expiratory phase examination. Mean 9 mm nodule within the subpleural right lower lobe along the right hemidiaphragm and axial image # 115/5 is decreased in size since remote prior examination of 06/29/2020 and is safely considered benign, likely the sequela of remote inflammation. Musculoskeletal: No acute bone abnormality. Remote T12 inferior endplate fracture with mild loss of height. No lytic or blastic bone lesion. Osseous structures are age-appropriate. Review of the MIP images confirms the above findings. CT ABDOMEN and PELVIS FINDINGS Hepatobiliary: No focal liver abnormality is seen. Status post cholecystectomy. No biliary dilatation. Pancreas: Unremarkable. No pancreatic ductal dilatation or surrounding inflammatory changes. Spleen: Unremarkable Adrenals/Urinary Tract: There is severe pelvic floor laxity with prolapse of the anterior compartment with the base of bladder approximately 8 cm below the pubococcygeal line. Foley catheter balloon is seen within a decompressed bladder lumen. There is narrowing of the distal left ureter best seen on axial image # 76-77, series # 2 which may relate to  extrinsic compression by marked anterior compartment prolapse or an intrinsic stricture. This results in severe left hydronephrosis and hydroureter and delayed left renal cortical enhancement related to obstructive uropathy. 2 mm nonobstructing calculus noted within the interpolar region of the left kidney. No additional intrarenal or ureteral calculi are identified. The right kidney appears mildly malrotated within the anterior-posterior dimension, but is otherwise unremarkable. No right-sided hydronephrosis identified. Stomach/Bowel: There is severe posterior compartment descent with prolapse of the rectum and the anorectal junction approximately 9.9 cm below the pubococcygeal line. There is marked perirectal edema involving the prolapsed rectum, best appreciated on sagittal image # 60/6. There is no evidence of obstruction. Severe distal colonic diverticulosis without superimposed acute inflammatory change. The stomach, small bowel, and large bowel are otherwise unremarkable. No free intraperitoneal gas or fluid. Status post appendectomy. Vascular/Lymphatic: Moderate aortoiliac atherosclerotic calcification with particularly prominent atherosclerotic calcification at the renal ostia bilaterally though the degree of stenosis is not well assessed on this non arteriographic study. No aortic aneurysm. No pathologic adenopathy within the abdomen and pelvis. Reproductive: Status post hysterectomy.  No adnexal masses. Other: None significant Musculoskeletal: No acute bone abnormality. No lytic or blastic bone lesion. Review of the MIP images confirms the above findings. IMPRESSION: 1. No pulmonary embolism. 2. Small bilateral pleural effusions with trace interstitial pulmonary edema. Together, the findings may reflect changes of mild cardiogenic failure. 3. Moderate multi-vessel coronary artery calcification. 4. Mild calcification of the aortic valve leaflets. Echocardiographic correlation for evaluation of potential  aortic stenosis may be helpful for further evaluation. 5. Severe posterior compartment descent with prolapse of the rectum and the anorectal junction approximately 9.9 cm below the pubococcygeal line. Marked perirectal edema involving the prolapsed rectum, possibly reflecting a component of proctitis. No evidence of obstruction. 6. Severe pelvic floor laxity with prolapse of the anterior compartment with the base of bladder approximately 8 cm below the pubococcygeal line. Narrowing of the distal left ureter which may relate to extrinsic compression by marked anterior compartment prolapse or an intrinsic stricture. This results in severe left hydronephrosis and hydroureter and delayed left renal cortical enhancement related to  obstructive uropathy. 7. 2 mm nonobstructing left renal calculus. 8. Severe distal colonic diverticulosis without superimposed acute inflammatory change. Aortic Atherosclerosis (ICD10-I70.0). Electronically Signed   By: Helyn Numbers M.D.   On: 03/23/2023 09:51   CT Angio Chest PE W and/or Wo Contrast  Result Date: 03/23/2023 CLINICAL DATA:  Acute nonlocalized abdominal pain, nausea. Fever, wheezing, hypoxia, dyspnea. EXAM: CT ANGIOGRAPHY CHEST CT ABDOMEN AND PELVIS WITH CONTRAST TECHNIQUE: Multidetector CT imaging of the chest was performed using the standard protocol during bolus administration of intravenous contrast. Multiplanar CT image reconstructions and MIPs were obtained to evaluate the vascular anatomy. Multidetector CT imaging of the abdomen and pelvis was performed using the standard protocol during bolus administration of intravenous contrast. RADIATION DOSE REDUCTION: This exam was performed according to the departmental dose-optimization program which includes automated exposure control, adjustment of the mA and/or kV according to patient size and/or use of iterative reconstruction technique. CONTRAST:  60mL OMNIPAQUE IOHEXOL 350 MG/ML SOLN COMPARISON:  None Available.  FINDINGS: CTA CHEST FINDINGS Cardiovascular: There is adequate opacification of the pulmonary arterial tree. There is no intraluminal filling defect identified to suggest acute pulmonary embolism. Global cardiac size is at the upper limits of normal with mild enlargement of the right atrium. Moderate multi-vessel coronary artery calcification noted. Mild calcification of the aortic valve leaflets noted. No pericardial effusion. The thoracic aorta is of normal caliber. Moderate atherosclerotic calcification noted within the thoracic aorta. Mediastinum/Nodes: Visualized thyroid is unremarkable. No pathologic thoracic adenopathy. The esophagus is unremarkable. Lungs/Pleura: Small bilateral pleural effusions are present. No pneumothorax. Subpleural fibrotic changes within the anterior left upper lobe and lingula likely reflect the sequela of prior conservative breast therapy. Trace interstitial pulmonary edema is present with development of mild smooth interlobular septal thickening best appreciated at the lung apices and lung bases. Superimposed ground-glass opacity likely reflects atelectasis on this expiratory phase examination. Mean 9 mm nodule within the subpleural right lower lobe along the right hemidiaphragm and axial image # 115/5 is decreased in size since remote prior examination of 06/29/2020 and is safely considered benign, likely the sequela of remote inflammation. Musculoskeletal: No acute bone abnormality. Remote T12 inferior endplate fracture with mild loss of height. No lytic or blastic bone lesion. Osseous structures are age-appropriate. Review of the MIP images confirms the above findings. CT ABDOMEN and PELVIS FINDINGS Hepatobiliary: No focal liver abnormality is seen. Status post cholecystectomy. No biliary dilatation. Pancreas: Unremarkable. No pancreatic ductal dilatation or surrounding inflammatory changes. Spleen: Unremarkable Adrenals/Urinary Tract: There is severe pelvic floor laxity with  prolapse of the anterior compartment with the base of bladder approximately 8 cm below the pubococcygeal line. Foley catheter balloon is seen within a decompressed bladder lumen. There is narrowing of the distal left ureter best seen on axial image # 76-77, series # 2 which may relate to extrinsic compression by marked anterior compartment prolapse or an intrinsic stricture. This results in severe left hydronephrosis and hydroureter and delayed left renal cortical enhancement related to obstructive uropathy. 2 mm nonobstructing calculus noted within the interpolar region of the left kidney. No additional intrarenal or ureteral calculi are identified. The right kidney appears mildly malrotated within the anterior-posterior dimension, but is otherwise unremarkable. No right-sided hydronephrosis identified. Stomach/Bowel: There is severe posterior compartment descent with prolapse of the rectum and the anorectal junction approximately 9.9 cm below the pubococcygeal line. There is marked perirectal edema involving the prolapsed rectum, best appreciated on sagittal image # 60/6. There is no evidence of obstruction. Severe distal  colonic diverticulosis without superimposed acute inflammatory change. The stomach, small bowel, and large bowel are otherwise unremarkable. No free intraperitoneal gas or fluid. Status post appendectomy. Vascular/Lymphatic: Moderate aortoiliac atherosclerotic calcification with particularly prominent atherosclerotic calcification at the renal ostia bilaterally though the degree of stenosis is not well assessed on this non arteriographic study. No aortic aneurysm. No pathologic adenopathy within the abdomen and pelvis. Reproductive: Status post hysterectomy.  No adnexal masses. Other: None significant Musculoskeletal: No acute bone abnormality. No lytic or blastic bone lesion. Review of the MIP images confirms the above findings. IMPRESSION: 1. No pulmonary embolism. 2. Small bilateral pleural  effusions with trace interstitial pulmonary edema. Together, the findings may reflect changes of mild cardiogenic failure. 3. Moderate multi-vessel coronary artery calcification. 4. Mild calcification of the aortic valve leaflets. Echocardiographic correlation for evaluation of potential aortic stenosis may be helpful for further evaluation. 5. Severe posterior compartment descent with prolapse of the rectum and the anorectal junction approximately 9.9 cm below the pubococcygeal line. Marked perirectal edema involving the prolapsed rectum, possibly reflecting a component of proctitis. No evidence of obstruction. 6. Severe pelvic floor laxity with prolapse of the anterior compartment with the base of bladder approximately 8 cm below the pubococcygeal line. Narrowing of the distal left ureter which may relate to extrinsic compression by marked anterior compartment prolapse or an intrinsic stricture. This results in severe left hydronephrosis and hydroureter and delayed left renal cortical enhancement related to obstructive uropathy. 7. 2 mm nonobstructing left renal calculus. 8. Severe distal colonic diverticulosis without superimposed acute inflammatory change. Aortic Atherosclerosis (ICD10-I70.0). Electronically Signed   By: Helyn Numbers M.D.   On: 03/23/2023 09:51   DG Chest Portable 1 View  Result Date: 03/23/2023 CLINICAL DATA:  hypoxia, SOB, abd pain, nausea EXAM: PORTABLE CHEST 1 VIEW COMPARISON:  02/04/2023. FINDINGS: Redemonstration of increased bronchovascular markings throughout bilateral lungs, likely age-related. No frank pulmonary edema. No consolidation or major atelectasis. Bilateral costophrenic angles are clear. Stable cardio-mediastinal silhouette. Aortic knob calcifications noted. No acute osseous abnormalities. The soft tissues are within normal limits. There are surgical staples in the left breast/axillary region. IMPRESSION: 1. No active disease. 2. Aortic atherosclerosis. Aortic  Atherosclerosis (ICD10-I70.0). Electronically Signed   By: Jules Schick M.D.   On: 03/23/2023 08:54    Microbiology: Results for orders placed or performed during the hospital encounter of 03/23/23  Culture, blood (Routine x 2)     Status: None (Preliminary result)   Collection Time: 03/23/23  8:18 AM   Specimen: Left Antecubital; Blood  Result Value Ref Range Status   Specimen Description   Final    LEFT ANTECUBITAL BLOOD Performed at [facility], [address] [place], [place] [address]    Special Requests   Final    BOTTLES DRAWN AEROBIC AND ANAEROBIC Blood Culture adequate volume Performed at [facility], [address] [place], [place] [address]    Culture   Final    NO GROWTH 2 DAYS Performed at [facility], [address] [place], [place] [address]    Report Status PENDING  Incomplete  Culture, blood (Routine x 2)     Status: None (Preliminary result)   Collection Time: 03/23/23  8:23 AM   Specimen: Right Antecubital; Blood  Result Value Ref Range Status   Specimen Description   Final    RIGHT ANTECUBITAL BLOOD Performed at [facility], [address] [place], [place] [address]    Special Requests  Final    BOTTLES DRAWN AEROBIC AND ANAEROBIC Blood Culture adequate volume Performed at [facility], [address], [place], [place] [address]    Culture   Final    NO GROWTH 2 DAYS Performed at [facility], [address] [place], [place] [address]    Report Status PENDING  Incomplete  Resp panel by RT-PCR (RSV, Flu A&B, Covid) Anterior Nasal Swab     Status: None   Collection Time: 03/23/23  8:23 AM   Specimen: Anterior Nasal Swab  Result Value Ref Range Status   SARS Coronavirus 2 by RT PCR NEGATIVE NEGATIVE Final    Comment: (NOTE) SARS-CoV-2 target nucleic acids are NOT DETECTED.  The SARS-CoV-2 RNA is generally detectable in upper respiratory specimens during the acute phase of  infection. The lowest concentration of SARS-CoV-2 viral copies this assay can detect is 138 copies/mL. A negative result does not preclude SARS-Cov-2 infection and should not be used as the sole basis for treatment or other patient management decisions. A negative result may occur with  improper specimen collection/handling, submission of specimen other than nasopharyngeal swab, presence of viral mutation(s) within the areas targeted by this assay, and inadequate number of viral copies(<138 copies/mL). A negative result must be combined with clinical observations, patient history, and epidemiological information. The expected result is Negative.  Fact Sheet for Patients:  BloggerCourse.com  Fact Sheet for Healthcare Providers:  SeriousBroker.it  This test is no t yet approved or cleared by the [place] FDA and  has been authorized for detection and/or diagnosis of SARS-CoV-2 by FDA under an Emergency Use Authorization (EUA). This EUA will remain  in effect (meaning this test can be used) for the duration of the COVID-19 declaration under Section 564(b)(1) of the Act, 21 U.S.C.section 360bbb-3(b)(1), unless the authorization is terminated  or revoked sooner.       Influenza A by PCR NEGATIVE NEGATIVE Final   Influenza B by PCR NEGATIVE NEGATIVE Final    Comment: (NOTE) The Xpert Xpress SARS-CoV-2/FLU/RSV plus assay is intended as an aid in the diagnosis of influenza from Nasopharyngeal swab specimens and should not be used as a sole basis for treatment. Nasal washings and aspirates are unacceptable for Xpert Xpress SARS-CoV-2/FLU/RSV testing.  Fact Sheet for Patients: BloggerCourse.com  Fact Sheet for Healthcare Providers: SeriousBroker.it  This test is not yet approved or cleared by the [place] FDA and has been authorized for detection and/or diagnosis of SARS-CoV-2  by FDA under an Emergency Use Authorization (EUA). This EUA will remain in effect (meaning this test can be used) for the duration of the COVID-19 declaration under Section 564(b)(1) of the Act, 21 U.S.C. section 360bbb-3(b)(1), unless the authorization is terminated or revoked.     Resp Syncytial Virus by PCR NEGATIVE NEGATIVE Final    Comment: (NOTE) Fact Sheet for Patients: BloggerCourse.com  Fact Sheet for Healthcare Providers: SeriousBroker.it  This test is not yet approved or cleared by the [place] FDA and has been authorized for detection and/or diagnosis of SARS-CoV-2 by FDA under an Emergency Use Authorization (EUA). This EUA will remain in effect (meaning this test can be used) for the duration of the COVID-19 declaration under Section 564(b)(1) of the Act, 21 U.S.C. section 360bbb-3(b)(1), unless the authorization is terminated or revoked.  Performed at [facility], [address], [place], [place] [address]   Urine Culture     Status: Abnormal   Collection Time: 03/23/23 11:00 AM   Specimen: Urine,  Clean Catch  Result Value Ref Range Status   Specimen Description   Final    URINE, CLEAN CATCH Performed at [facility], [address], [place], [place] [address]    Special Requests   Final    NONE Performed at [facility], [address] [place], [place] [address]    Culture (A)  Final    <10,000 COLONIES/mL INSIGNIFICANT GROWTH Performed at [facility], [address], [place], [place] [address]    Report Status 03/24/2023 FINAL  Final    Labs: CBC: Recent Labs  Lab 03/23/23 0818 03/24/23 0107 03/25/23 0056  WBC 18.3* 17.9* 11.9*  NEUTROABS 15.7*  --   --   HGB 11.3* 10.3* 9.6*  HCT 34.1* 31.3* 29.8*  MCV 97.7 98.4 97.1  PLT 236 215 210   Basic Metabolic Panel: Recent Labs  Lab 03/23/23 0818 03/24/23 0107  03/25/23 0056  NA 137 135 133*  K 3.7 3.7 4.2  CL 99 98 98  CO2 29 29 29   GLUCOSE 148* 103* 103*  BUN 20 16 24*  CREATININE 1.08* 1.05* 1.20*  CALCIUM 9.9 8.4* 8.5*   Liver Function Tests: Recent Labs  Lab 03/23/23 0818 03/25/23 0056  AST 24 15  ALT 16 16  ALKPHOS 76 62  BILITOT 0.8 0.6  PROT 7.2 5.8*  ALBUMIN 4.2 2.5*   CBG: No results for input(s): "GLUCAP" in the last 168 hours.  Discharge time spent: approximately 35 minutes spent on discharge counseling, evaluation of patient on day of discharge, and coordination of discharge planning with nursing, social work, pharmacy and case management  Signed: Alberteen Sam, MD [facility] 03/25/2023

## 2023-03-25 NOTE — Progress Notes (Signed)
SATURATION QUALIFICATIONS: (This note is used to comply with regulatory documentation for home oxygen)  Patient Saturations on Room Air at Rest = 87%  Patient Saturations on Room Air while Ambulating = NT as pt desaturates on RA at rest  Patient Saturations on 2 Liters of oxygen while Ambulating = 89%  Please briefly explain why patient needs home oxygen:Pt desaturates on RA at rest and with activity.  Appears to need 1LO2 at rest and 2LO2 with activity to maintain saturations.  Ragen Laver M,PT Acute Rehab Services [phone]

## 2023-03-25 NOTE — TOC Transition Note (Signed)
Transition of Care [facility]) - CM/SW Discharge Note   Patient Details  Name: Adrienne [name] MRN: [id number] Date of Birth: 1927/11/07  Transition of Care [facility]) CM/SW Contact:  Ronny Bacon, RN Phone Number: 03/25/2023, 12:03 PM   Clinical Narrative:   Patient being discharged home today. HH PT arranged through [facility]. Oxygen arranged through [facility].    Final next level of care: Home w Home Health Services Barriers to Discharge: No Barriers Identified   Patient Goals and CMS Choice      Discharge Placement                         Discharge Plan and Services Additional resources added to the After Visit Summary for                  DME Arranged: Oxygen DME Agency: [facility] Date DME Agency Contacted: 03/25/23 Time DME Agency Contacted: 1201 Representative spoke with at DME Agency: Leavy Cella HH Arranged: PT HH Agency: [facility] Date [facility] Agency Contacted: 03/25/23 Time HH Agency Contacted: 1138 Representative spoke with at [facility] Agency: Kandee Keen  Social Determinants of Health (SDOH) Interventions SDOH Screenings   Food Insecurity: No Food Insecurity (03/23/2023)  Housing: Patient Declined (03/23/2023)  Transportation Needs: No Transportation Needs (03/23/2023)  Utilities: Not At Risk (03/23/2023)  Tobacco Use: Medium Risk (03/23/2023)     Readmission Risk Interventions    02/06/2023    3:10 PM 12/18/2022   11:03 AM  Readmission Risk Prevention Plan  Transportation Screening Complete Complete  PCP or Specialist Appt within 3-5 Days  Complete  HRI or Home Care Consult  Complete  Social Work Consult for Recovery Care Planning/Counseling  Complete  Palliative Care Screening  Not Applicable  Medication Review [employer]) Complete Complete  PCP or Specialist appointment within 3-5 days of discharge Complete   HRI or Home Care Consult Complete   SW Recovery Care/Counseling Consult Complete   Palliative Care Screening Not  Applicable   Skilled Nursing Facility Not Applicable

## 2023-03-25 NOTE — Evaluation (Signed)
Physical Therapy Evaluation Patient Details Name: Adrienne [name] MRN: [id number] DOB: 01-26-1928 Today's Date: 03/25/2023  History of Present Illness  Mrs. Dimambro is a 89 y.o. F admitted 7/11  for CHF flare and complicated MRSA UTI. PMH: severe bladder prolapse and chronic indwelling foley, cAF on Eliquis, dCHF, pHTN, CAD, CKD IIIb baseline 1.1-1.4, and hx CVA  Clinical Impression  Pt admitted with above diagnosis. Pt was able to ambulate with RW with good safety overall. Pt did desaturate (see O2 note) and needed 2LO2 at rest and with activity to maintain O2 saturations.  Will continue PT acutely and recommend HHPT as well.  Pt currently with functional limitations due to the deficits listed below (see PT Problem List). Pt will benefit from acute skilled PT to increase their independence and safety with mobility to allow discharge.           Assistance Recommended at Discharge Set up Supervision/Assistance  If plan is discharge home, recommend the following:  Can travel by private vehicle  A little help with walking and/or transfers;A little help with bathing/dressing/bathroom;Assistance with cooking/housework;Help with stairs or ramp for entrance;Assist for transportation        Equipment Recommendations Other (comment) (home O2)  Recommendations for Other Services       Functional Status Assessment Patient has had a recent decline in their functional status and demonstrates the ability to make significant improvements in function in a reasonable and predictable amount of time.     Precautions / Restrictions Precautions Precautions: Fall Restrictions Weight Bearing Restrictions: No      Mobility  Bed Mobility Overal bed mobility: Independent                  Transfers Overall transfer level: Needs assistance Equipment used: Rolling walker (2 wheels) Transfers: Sit to/from Stand Sit to Stand: Min guard                 Ambulation/Gait Ambulation/Gait assistance: Supervision Gait Distance (Feet): 90 Feet Assistive device: Rolling walker (2 wheels) Gait Pattern/deviations: Step-through pattern, Decreased stride length, Trunk flexed   Gait velocity interpretation: <1.31 ft/sec, indicative of household ambulator   General Gait Details: Pt safe with use of RW.  DOE 3/4 toward mid to end of walk without O2 in place as testing O2 needs.  Pt requiring 2LO2 to keep sats > 88%.  Stairs            Wheelchair Mobility     Tilt Bed    Modified Rankin (Stroke Patients Only)       Balance Overall balance assessment: Needs assistance Sitting-balance support: No upper extremity supported, Feet supported Sitting balance-Leahy Scale: Fair     Standing balance support: Bilateral upper extremity supported, During functional activity Standing balance-Leahy Scale: Poor Standing balance comment: relies on UE support for balance.                             Pertinent Vitals/Pain Pain Assessment Pain Assessment: No/denies pain    Home Living Family/patient expects to be discharged to:: Private residence Living Arrangements: Spouse/significant other Available Help at Discharge: Family;Available 24 hours/day (husband with bad back and uses cane) Type of Home: House Home Access: Level entry       Home Layout: One level Home Equipment: Educational psychologist (4 wheels);Rolling Walker (2 wheels);Grab bars - tub/shower (pulse oximeter) Additional Comments: shower seat can't fit in the shower    Prior Function  Mobility Comments: used a rollator outside but no device use inside home ADLs Comments: groceries delivered, indep with dressing/bathing     Hand Dominance   Dominant Hand: Right    Extremity/Trunk Assessment   Upper Extremity Assessment Upper Extremity Assessment: Defer to OT evaluation    Lower Extremity Assessment Lower Extremity Assessment:  Generalized weakness    Cervical / Trunk Assessment Cervical / Trunk Assessment: Kyphotic  Communication   Communication: HOH  Cognition Arousal/Alertness: Awake/alert Behavior During Therapy: WFL for tasks assessed/performed Overall Cognitive Status: Within Functional Limits for tasks assessed                                          General Comments General comments (skin integrity, edema, etc.): 88 bpm, 146/61, 98 % 2L,87% on RA after 4 min with O2 off.  Replaced O2 and sats 95% and >.  Pt required 2LO2 to ambulate to keep sats >88%.    Exercises     Assessment/Plan    PT Assessment Patient needs continued PT services  PT Problem List Decreased activity tolerance;Decreased balance;Decreased mobility;Decreased knowledge of use of DME;Decreased safety awareness;Decreased knowledge of precautions;Cardiopulmonary status limiting activity       PT Treatment Interventions DME instruction;Gait training;Functional mobility training;Therapeutic activities;Therapeutic exercise;Balance training;Patient/family education    PT Goals (Current goals can be found in the Care Plan section)  Acute Rehab PT Goals Patient Stated Goal: to go home PT Goal Formulation: With patient Time For Goal Achievement: 04/08/23 Potential to Achieve Goals: Good    Frequency Min 1X/week     Co-evaluation               AM-PAC PT "6 Clicks" Mobility  Outcome Measure Help needed turning from your back to your side while in a flat bed without using bedrails?: None Help needed moving from lying on your back to sitting on the side of a flat bed without using bedrails?: None Help needed moving to and from a bed to a chair (including a wheelchair)?: A Little Help needed standing up from a chair using your arms (e.g., wheelchair or bedside chair)?: A Little Help needed to walk in hospital room?: A Little Help needed climbing 3-5 steps with a railing? : A Lot 6 Click Score: 19    End  of Session Equipment Utilized During Treatment: Gait belt;Oxygen Activity Tolerance: Patient limited by fatigue Patient left: in bed;with call bell/phone within reach;with bed alarm set;with family/visitor present Nurse Communication: Mobility status PT Visit Diagnosis: Muscle weakness (generalized) (M62.81)    Time: 0102-7253 PT Time Calculation (min) (ACUTE ONLY): 31 min   Charges:   PT Evaluation $PT Eval Moderate Complexity: 1 Mod PT Treatments $Gait Training: 8-22 mins PT General Charges $$ ACUTE PT VISIT: 1 Visit         Welles Walthall M,PT Acute Rehab Services [phone]   Bevelyn Buckles 03/25/2023, 10:39 AM

## 2023-03-27 LAB — CULTURE, BLOOD (ROUTINE X 2)
Culture: NO GROWTH
Culture: NO GROWTH
Special Requests: ADEQUATE

## 2023-03-28 ENCOUNTER — Emergency Department (HOSPITAL_COMMUNITY): Payer: Medicare Other

## 2023-03-28 ENCOUNTER — Emergency Department (HOSPITAL_BASED_OUTPATIENT_CLINIC_OR_DEPARTMENT_OTHER): Payer: Medicare Other

## 2023-03-28 ENCOUNTER — Other Ambulatory Visit (HOSPITAL_BASED_OUTPATIENT_CLINIC_OR_DEPARTMENT_OTHER): Payer: Self-pay

## 2023-03-28 ENCOUNTER — Other Ambulatory Visit: Payer: Self-pay

## 2023-03-28 ENCOUNTER — Emergency Department (HOSPITAL_BASED_OUTPATIENT_CLINIC_OR_DEPARTMENT_OTHER)
Admission: EM | Admit: 2023-03-28 | Discharge: 2023-03-28 | Disposition: A | Discharge status: Home / Self Care | Payer: Medicare Other | Attending: Emergency Medicine | Admitting: Emergency Medicine

## 2023-03-28 ENCOUNTER — Encounter (HOSPITAL_BASED_OUTPATIENT_CLINIC_OR_DEPARTMENT_OTHER): Payer: Self-pay | Admitting: Emergency Medicine

## 2023-03-28 DIAGNOSIS — S199XXA Unspecified injury of neck, initial encounter: Secondary | ICD-10-CM | POA: Diagnosis not present

## 2023-03-28 DIAGNOSIS — G9389 Other specified disorders of brain: Secondary | ICD-10-CM | POA: Diagnosis not present

## 2023-03-28 DIAGNOSIS — M19012 Primary osteoarthritis, left shoulder: Secondary | ICD-10-CM | POA: Diagnosis not present

## 2023-03-28 DIAGNOSIS — W19XXXA Unspecified fall, initial encounter: Secondary | ICD-10-CM | POA: Diagnosis not present

## 2023-03-28 DIAGNOSIS — M25512 Pain in left shoulder: Secondary | ICD-10-CM | POA: Diagnosis not present

## 2023-03-28 DIAGNOSIS — R42 Dizziness and giddiness: Secondary | ICD-10-CM | POA: Insufficient documentation

## 2023-03-28 DIAGNOSIS — I6782 Cerebral ischemia: Secondary | ICD-10-CM | POA: Diagnosis not present

## 2023-03-28 DIAGNOSIS — G8911 Acute pain due to trauma: Secondary | ICD-10-CM | POA: Insufficient documentation

## 2023-03-28 DIAGNOSIS — I639 Cerebral infarction, unspecified: Secondary | ICD-10-CM | POA: Diagnosis not present

## 2023-03-28 DIAGNOSIS — R600 Localized edema: Secondary | ICD-10-CM | POA: Insufficient documentation

## 2023-03-28 DIAGNOSIS — S0990XA Unspecified injury of head, initial encounter: Secondary | ICD-10-CM | POA: Diagnosis not present

## 2023-03-28 DIAGNOSIS — R079 Chest pain, unspecified: Secondary | ICD-10-CM | POA: Diagnosis not present

## 2023-03-28 DIAGNOSIS — Z7901 Long term (current) use of anticoagulants: Secondary | ICD-10-CM | POA: Insufficient documentation

## 2023-03-28 DIAGNOSIS — R29818 Other symptoms and signs involving the nervous system: Secondary | ICD-10-CM | POA: Diagnosis not present

## 2023-03-28 DIAGNOSIS — I69398 Other sequelae of cerebral infarction: Secondary | ICD-10-CM | POA: Diagnosis not present

## 2023-03-28 LAB — COMPREHENSIVE METABOLIC PANEL
ALT: 14 U/L (ref 0–44)
AST: 17 U/L (ref 15–41)
Albumin: 3.9 g/dL (ref 3.5–5.0)
Alkaline Phosphatase: 64 U/L (ref 38–126)
Anion gap: 7 (ref 5–15)
BUN: 26 mg/dL — ABNORMAL HIGH (ref 8–23)
CO2: 30 mmol/L (ref 22–32)
Calcium: 9.8 mg/dL (ref 8.9–10.3)
Chloride: 97 mmol/L — ABNORMAL LOW (ref 98–111)
Creatinine, Ser: 1.32 mg/dL — ABNORMAL HIGH (ref 0.44–1.00)
GFR, Estimated: 38 mL/min — ABNORMAL LOW (ref >60)
Glucose, Bld: 141 mg/dL — ABNORMAL HIGH (ref 70–99)
Potassium: 4 mmol/L (ref 3.5–5.1)
Sodium: 134 mmol/L — ABNORMAL LOW (ref 135–145)
Total Bilirubin: 0.6 mg/dL (ref 0.3–1.2)
Total Protein: 7 g/dL (ref 6.5–8.1)

## 2023-03-28 LAB — CBC WITH DIFFERENTIAL/PLATELET
Abs Immature Granulocytes: 0.03 10*3/uL (ref 0.00–0.07)
Basophils Absolute: 0.1 10*3/uL (ref 0.0–0.1)
Basophils Relative: 0 %
Eosinophils Absolute: 0.1 10*3/uL (ref 0.0–0.5)
Eosinophils Relative: 1 %
HCT: 31.3 % — ABNORMAL LOW (ref 36.0–46.0)
Hemoglobin: 10.4 g/dL — ABNORMAL LOW (ref 12.0–15.0)
Immature Granulocytes: 0 %
Lymphocytes Relative: 9 %
Lymphs Abs: 1 10*3/uL (ref 0.7–4.0)
MCH: 31.9 pg (ref 26.0–34.0)
MCHC: 33.2 g/dL (ref 30.0–36.0)
MCV: 96 fL (ref 80.0–100.0)
Monocytes Absolute: 1.6 10*3/uL — ABNORMAL HIGH (ref 0.1–1.0)
Monocytes Relative: 14 %
Neutro Abs: 8.5 10*3/uL — ABNORMAL HIGH (ref 1.7–7.7)
Neutrophils Relative %: 76 %
Platelets: 240 10*3/uL (ref 150–400)
RBC: 3.26 MIL/uL — ABNORMAL LOW (ref 3.87–5.11)
RDW: 14 % (ref 11.5–15.5)
WBC: 11.2 10*3/uL — ABNORMAL HIGH (ref 4.0–10.5)
nRBC: 0 % (ref 0.0–0.2)

## 2023-03-28 LAB — URINALYSIS, ROUTINE W REFLEX MICROSCOPIC
Bilirubin Urine: NEGATIVE
Glucose, UA: NEGATIVE mg/dL
Ketones, ur: NEGATIVE mg/dL
Nitrite: NEGATIVE
RBC / HPF: >50 RBC/hpf (ref 0–5)
Specific Gravity, Urine: 1.018 (ref 1.005–1.030)
pH: 7 (ref 5.0–8.0)

## 2023-03-28 LAB — CULTURE, BLOOD (ROUTINE X 2): Special Requests: ADEQUATE

## 2023-03-28 MED ORDER — HALOPERIDOL LACTATE 5 MG/ML IJ SOLN
1.0000 mg | Freq: Once | INTRAMUSCULAR | Status: AC
  Administered 2023-03-28: 1 mg via INTRAVENOUS
  Filled 2023-03-28: qty 1

## 2023-03-28 MED ORDER — MECLIZINE HCL 25 MG PO TABS
25.0000 mg | ORAL_TABLET | Freq: Once | ORAL | Status: AC
  Administered 2023-03-28: 25 mg via ORAL
  Filled 2023-03-28: qty 1

## 2023-03-28 MED ORDER — CEFPODOXIME PROXETIL 100 MG PO TABS
100.0000 mg | ORAL_TABLET | Freq: Two times a day (BID) | ORAL | 0 refills | Status: DC
  Filled 2023-03-28: qty 14, 7d supply, fill #0

## 2023-03-28 MED ORDER — OXYCODONE HCL 5 MG PO TABS
2.5000 mg | ORAL_TABLET | Freq: Once | ORAL | Status: AC
  Administered 2023-03-28: 2.5 mg via ORAL
  Filled 2023-03-28: qty 1

## 2023-03-28 MED ORDER — CEFPODOXIME PROXETIL 100 MG PO TABS: 100.0000 mg | ORAL_TABLET | Freq: Two times a day (BID) | ORAL | 0 refills | Status: AC

## 2023-03-28 MED ORDER — OXYCODONE HCL 5 MG PO TABS
5.0000 mg | ORAL_TABLET | Freq: Once | ORAL | Status: AC
  Administered 2023-03-28: 5 mg via ORAL
  Filled 2023-03-28: qty 1

## 2023-03-28 MED ORDER — DICLOFENAC SODIUM 1 % EX GEL
4.0000 g | Freq: Four times a day (QID) | CUTANEOUS | 0 refills | Status: DC
  Filled 2023-03-28: qty 100, 7d supply, fill #0

## 2023-03-28 MED ORDER — SODIUM CHLORIDE 0.9 % IV BOLUS
1000.0000 mL | Freq: Once | INTRAVENOUS | Status: AC
  Administered 2023-03-28: 1000 mL via INTRAVENOUS

## 2023-03-28 MED ORDER — DICLOFENAC SODIUM 1 % EX GEL: 4.0000 g | Freq: Four times a day (QID) | CUTANEOUS | 0 refills | Status: DC

## 2023-03-28 MED ORDER — ACETAMINOPHEN 500 MG PO TABS
1000.0000 mg | ORAL_TABLET | Freq: Once | ORAL | Status: AC
  Administered 2023-03-28: 1000 mg via ORAL
  Filled 2023-03-28: qty 2

## 2023-03-28 NOTE — ED Provider Notes (Signed)
  Physical Exam  BP 138/85   Pulse 73   Temp 97.7 F (36.5 C)   Resp 15   Ht 4\' 11"  (1.499 m)   Wt 59 kg   LMP 09/12/1973 (Approximate)   SpO2 93%   BMI 26.26 kg/m   Physical Exam  Procedures  Procedures  ED Course / MDM   Clinical Course as of 03/28/23 1704  Tue Mar 28, 2023  1542 Assumed care from Dr. Jeraldine Loots.  89 year old female who presented to the emergency department with left shoulder pain after a fall due to dizziness.  Did not have any truncal ataxia or other cerebellar signs but was unable to be walked in the emergency department.  Appears that her symptoms have been going on since yesterday.  Had a CT head that was unremarkable.  Is ordered for an MRI and if normal should be able to go home.  X-rays of her shoulder did not reveal any acute abnormalities.  Will need to be transition from [facility] to [facility] for UTI due to concerns for intolerance. [RP]  1642 MRI performed does not show any acute abnormalities. [RP]  1703 Patient reassessed.  Is doing well and is back to her baseline.  Will discharge her with Vantin and have her follow-up with her primary doctor in 2 to 3 days. [RP]    Clinical Course User Index [RP] Rondel Baton, MD   Medical Decision Making Amount and/or Complexity of Data Reviewed Labs: ordered. Radiology: ordered.  Risk OTC drugs. Prescription drug management.       Rondel Baton, MD 03/28/23 [phone]

## 2023-03-28 NOTE — ED Provider Notes (Signed)
[place] EMERGENCY DEPARTMENT AT [facility] Provider Note   CSN: [id number] Arrival date & time: 03/28/23  0827     History  Chief Complaint  Patient presents with   Shoulder Pain    Adrienne [name] is a 89 y.o. female.  89 yo F with a chief complaint of left shoulder pain.  The patient tells me that she fell yesterday because she was dizzy.  Has been dizzy since she was discharged from the hospital and thinks it was due to her oral antibiotics that she was on.  Was just admitted for urinary tract infection.  She is also supposed be on oxygen and just had it delivered yesterday and has not been wearing it.  She think she suffered an injury to her left shoulder and left side of her chest after the fall.  She tried tramadol and Tylenol for it.  She denies head injury denies neck pain.  Denies abdominal pain.  Feels like she is been eating and drinking normally.  Denies extremity pain otherwise.   Shoulder Pain      Home Medications Prior to Admission medications   Medication Sig Start Date End Date Taking? Authorizing Provider  cefpodoxime (VANTIN) 100 MG tablet Take 1 tablet (100 mg total) by mouth 2 (two) times daily for 7 days. 03/28/23 04/04/23 Yes Melene Plan, DO  diclofenac Sodium (VOLTAREN) 1 % GEL Apply 4 g topically 4 (four) times daily. 03/28/23  Yes Melene Plan, DO  acetaminophen (TYLENOL) 500 MG tablet Take 500-1,000 mg by mouth every 12 (twelve) hours as needed (pain).     [provider]  albuterol (VENTOLIN HFA) 108 (90 Base) MCG/ACT inhaler Inhale 1 puff into the lungs every 6 (six) hours as needed for wheezing or shortness of breath (Cough). 09/08/22   Regalado, Belkys A, MD  allopurinol (ZYLOPRIM) 100 MG tablet Take 1 tablet (100 mg total) by mouth daily. 09/08/22   Regalado, Belkys A, MD  apixaban (ELIQUIS) 2.5 MG TABS tablet Take 1 tablet (2.5 mg total) by mouth 2 (two) times daily. 10/07/22   Jake Bathe, MD  ascorbic acid (VITAMIN C)  500 MG tablet Take 500 mg by mouth 2 (two) times daily.    [provider]  atorvastatin (LIPITOR) 10 MG tablet TAKE 1 TABLET DAILY 11/28/22   Jake Bathe, MD  B Complex Vitamins (VITAMIN B COMPLEX) CAPS Take 1 capsule by mouth daily.    [provider]  calcium carbonate (OSCAL) 1500 (600 Ca) MG TABS tablet Take 1,500 mg by mouth 2 (two) times daily with a meal.    [provider]  diltiazem (CARDIZEM) 60 MG tablet TAKE 1 TABLET THREE TIMES A DAY 11/28/22   Jake Bathe, MD  docusate sodium (COLACE) 100 MG capsule Take 100 mg by mouth 2 (two) times daily.    [provider]  furosemide (LASIX) 20 MG tablet Take 1 tablet (20 mg total) by mouth daily. 01/31/23   Jake Bathe, MD  metoprolol tartrate (LOPRESSOR) 100 MG tablet TAKE 1 TABLET TWICE A DAY 11/28/22   Jake Bathe, MD  Multiple Vitamins-Minerals (MULTIVITAMIN WITH MINERALS) tablet Take 1 tablet by mouth daily.    [provider]  omeprazole (PRILOSEC) 10 MG capsule Take 10 mg by mouth every other day. 03/21/19   [provider]  polyethylene glycol (MIRALAX / GLYCOLAX) 17 g packet Take 17 g by mouth daily.    [provider]  Probiotic Product (UP4 PROBIOTICS WOMENS  PO) Take 1 capsule by mouth daily with breakfast.    [provider]  sulfamethoxazole-trimethoprim (BACTRIM) 400-80 MG tablet Take 1 tablet by mouth 2 (two) times daily. 03/25/23   Danford, Earl Lites, MD      Allergies    Valium [diazepam], Amiodarone hcl [amiodarone], Compazine [prochlorperazine], Other, Thorazine [chlorpromazine], Zometa [zoledronic acid], Ciprofloxacin, Doxycycline, Zoster vac recomb adjuvanted, and Iodinated contrast media    Review of Systems   Review of Systems  Physical Exam Updated Vital Signs BP (!) 155/95   Pulse 69   Temp 97.8 F (36.6 C) (Oral)   Resp (!) 23   Ht 4\' 11"  (1.499 m)   Wt 59 kg   LMP 09/12/1973 (Approximate)   SpO2 95%   BMI 26.26 kg/m   Physical Exam Vitals and nursing note reviewed.  Constitutional:      General: She is not in acute distress.    Appearance: She is well-developed. She is not diaphoretic.  HENT:     Head: Normocephalic and atraumatic.     Mouth/Throat:     Comments: Tacky mucous membranes Eyes:     Pupils: Pupils are equal, round, and reactive to light.  Cardiovascular:     Rate and Rhythm: Normal rate and regular rhythm.     Heart sounds: No murmur heard.    No friction rub. No gallop.  Pulmonary:     Effort: Pulmonary effort is normal.     Breath sounds: No wheezing or rales.  Abdominal:     General: There is no distension.     Palpations: Abdomen is soft.     Tenderness: There is no abdominal tenderness.  Musculoskeletal:        General: No tenderness.     Cervical back: Normal range of motion and neck supple.     Right lower leg: Edema present.     Left lower leg: Edema present.     Comments: Patient is unable to range the shoulder due to discomfort.  She has no obvious pain along the clavicle but has pain at the [facility] joint.  No obvious scapular discomfort.  Has a mild pain at the proximal humerus.  No obvious pain at the elbow.  I am able to supinate and pronate the forearm without issue.  She has some left chest wall tenderness about the anterior axillary line about ribs 4 through 8.  Chronic appearing bilateral lower extremity edema with chronic skin changes  Skin:    General: Skin is warm and dry.  Neurological:     Mental Status: She is alert and oriented to person, place, and time.     GCS: GCS eye subscore is 4. GCS verbal subscore is 5. GCS motor subscore is 6.     Cranial Nerves: Cranial nerves 2-12 are intact.     Sensory: Sensation is intact.     Motor: Motor function is intact.     Coordination: Coordination is intact.     Comments: Patient is able to sit up and cross her arms without any dizziness or truncal ataxia.  She has difficulty using her left arm due to left shoulder  pain otherwise has no obvious cerebellar signs.  Extraocular motions intact without nystagmus.  Psychiatric:        Behavior: Behavior normal.     ED Results / Procedures / Treatments   Labs (all labs ordered are listed, but only abnormal results are displayed) Labs Reviewed  CBC WITH DIFFERENTIAL/PLATELET - Abnormal; Notable for the following  components:      Result Value   WBC 11.2 (*)    RBC 3.26 (*)    Hemoglobin 10.4 (*)    HCT 31.3 (*)    Neutro Abs 8.5 (*)    Monocytes Absolute 1.6 (*)    All other components within normal limits  URINALYSIS, ROUTINE W REFLEX MICROSCOPIC - Abnormal; Notable for the following components:   APPearance HAZY (*)    Hgb urine dipstick LARGE (*)    Protein, ur TRACE (*)    Leukocytes,Ua MODERATE (*)    Bacteria, UA RARE (*)    All other components within normal limits  COMPREHENSIVE METABOLIC PANEL - Abnormal; Notable for the following components:   Sodium 134 (*)    Chloride 97 (*)    Glucose, Bld 141 (*)    BUN 26 (*)    Creatinine, Ser 1.32 (*)    GFR, Estimated 38 (*)    All other components within normal limits    EKG EKG Interpretation Date/Time:  Tuesday March 28 2023 08:43:47 EDT Ventricular Rate:  67 PR Interval:    QRS Duration:  148 QT Interval:  426 QTC Calculation: 450 R Axis:   130  Text Interpretation: Atrial fibrillation Ventricular premature complex Nonspecific intraventricular conduction delay Anterior infarct, old No significant change since last tracing Confirmed by Melene Plan [phone]) on 03/28/2023 9:23:35 AM  Radiology CT Head Wo Contrast  Result Date: 03/28/2023 CLINICAL DATA:  Head trauma, minor (Age >= 65y); Neck trauma (Age >= 65y). EXAM: CT HEAD WITHOUT CONTRAST TECHNIQUE: Contiguous axial images were obtained from the base of the skull through the vertex without intravenous contrast. RADIATION DOSE REDUCTION: This exam was performed according to the departmental dose-optimization program which includes  automated exposure control, adjustment of the mA and/or kV according to patient size and/or use of iterative reconstruction technique. COMPARISON:  Head CT 01/25/2016.  MRI brain 01/27/2016. FINDINGS: Brain: No acute hemorrhage. 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 No new loss of cortical gray-white differentiation. Background of moderate chronic small-vessel disease, slightly progressed from 2017. Hydrocephalus or extra-axial collection. No mass effect or midline shift. Vascular: No hyperdense vessel or unexpected calcification. Skull: No calvarial fracture or suspicious bone lesion. Skull base is unremarkable. Sinuses/Orbits: Unremarkable. Other: None. IMPRESSION: 1. No acute intracranial abnormality. 2. 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 3. Background of moderate chronic small-vessel disease, slightly progressed from 2017. Electronically Signed   By: Orvan Falconer M.D.   On: 03/28/2023 10:05   CT Cervical Spine Wo Contrast  Result Date: 03/28/2023 CLINICAL DATA:  Head trauma, minor (Age >= 65y); Neck trauma (Age >= 65y). EXAM: CT HEAD WITHOUT CONTRAST TECHNIQUE: Contiguous axial images were obtained from the base of the skull through the vertex without intravenous contrast. RADIATION DOSE REDUCTION: This exam was performed according to the departmental dose-optimization program which includes automated exposure control, adjustment of the mA and/or kV according to patient size and/or use of iterative reconstruction technique. COMPARISON:  Head CT 01/25/2016.  MRI brain 01/27/2016. FINDINGS: Brain: No acute hemorrhage. 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 No new loss of cortical gray-white differentiation. Background of moderate chronic small-vessel disease, slightly progressed from 2017. Hydrocephalus or extra-axial collection. No mass effect or midline shift. Vascular: No hyperdense vessel or  unexpected calcification. Skull: No calvarial fracture or suspicious bone lesion. Skull base is unremarkable. Sinuses/Orbits: Unremarkable. Other: None. IMPRESSION: 1. No acute intracranial abnormality. 2. Unchanged encephalomalacia  from prior infarct in the right parietal lobe. 3. Background of moderate chronic small-vessel disease, slightly progressed from 2017. Electronically Signed   By: Orvan Falconer M.D.   On: 03/28/2023 10:05   DG Chest Port 1 View  Result Date: 03/28/2023 CLINICAL DATA:  chest pain post fall EXAM: PORTABLE CHEST 1 VIEW COMPARISON:  03/23/2023. FINDINGS: Redemonstration of increased bronchovascular markings. No pulmonary edema. No acute consolidation, major atelectasis or pneumothorax. Bilateral costophrenic angles are clear. Stable cardio-mediastinal silhouette. No acute osseous abnormalities. The soft tissues are within normal limits. There are surgical staples along the left lateral chest wall. IMPRESSION: *No acute cardiopulmonary disease. Electronically Signed   By: Jules Schick M.D.   On: 03/28/2023 09:37   DG Shoulder Left  Result Date: 03/28/2023 CLINICAL DATA:  chest pain post fall EXAM: LEFT SHOULDER - 2+ VIEW COMPARISON:  CT scan chest from 03/23/2023. FINDINGS: No acute fracture or dislocation. No aggressive osseous lesion. Glenohumeral and acromioclavicular joints are normal in alignment. Severe osteoarthritis changes of the acromioclavicular joint. Moderate-to-severe osteoarthritis of the glenohumeral joint. Subacromial spurring. No soft tissue swelling. No radiopaque foreign bodies. IMPRESSION: 1. No acute fracture or dislocation. 2. Moderate-to-severe degenerative joint disease. Electronically Signed   By: Jules Schick M.D.   On: 03/28/2023 09:34    Procedures Procedures    Medications Ordered in ED Medications  sodium chloride 0.9 % bolus 1,000 mL (0 mLs Intravenous Stopped 03/28/23 1108)  acetaminophen (TYLENOL) tablet 1,000 mg (1,000 mg Oral Given  03/28/23 0918)  oxyCODONE (Oxy IR/ROXICODONE) immediate release tablet 2.5 mg (2.5 mg Oral Given 03/28/23 0918)  meclizine (ANTIVERT) tablet 25 mg (25 mg Oral Given 03/28/23 0918)  oxyCODONE (Oxy IR/ROXICODONE) immediate release tablet 5 mg (5 mg Oral Given 03/28/23 1129)    ED Course/ Medical Decision Making/ A&P                             Medical Decision Making Amount and/or Complexity of Data Reviewed Labs: ordered. Radiology: ordered.  Risk OTC drugs. Prescription drug management.   89 yo F with multiple complaints.  She tells me that she was dizzy after taking her antibiotic for her urinary tract infection.  On my record review the patient was recently hospitalized for urinary tract infection and hypoxia.  She was discharged with a 10-day course of Bactrim.  She was worried that was making her dizzy and so stopped a couple days ago.  Her culture results were reviewed and I do not see any recent culture positive results for her.  She does have a more remote E. coli that seem to be sensitive to cephalosporins as well as a prior pseudomonal UTI that was also sensitive to cephalosporins.  Will likely transition to Vantin.  Patient appears to be dehydrated on initial exam.  Could be the cause of her dizziness.  Will give a bolus of IV fluids.  Blood work.  With recent fall and dizziness will obtain a CT of the head and C-spine.  Plain film of the left shoulder and of the chest.  Plain film of the left shoulder independently interpreted by me without fracture or dislocation.  Plain film of the chest without obvious pneumothorax.  No acute anemia.  Patient with mild bump in her renal function.  This could be due to increased diuresis recently in the hospital.  She was given a bolus of IV fluids and meclizine.  She was ambulated but told me that  she felt very subjectively dizzy.  She was able to ambulate with a walker which is at her baseline.  No significant limitation without left shoulder.  No  hypoxia.  The patient's UA is not consistent with infection but since she was just admitted I did send a prescription of Vantin to the pharmacy.  I discussed with the family concern for possible posterior circulation stroke.  Discussed risk and benefits of emergent MRI imaging.  Family and patient currently electing to have that performed.  I discussed the case with Dr. Durwin Nora except the patient in ED to ED transfer.  I feel if this is negative as the patient is able to ambulate and has no obvious acute electrolyte derangement or anemia then likely could go home with continued outpatient follow-up.  The patients results and plan were reviewed and discussed.   Any x-rays performed were independently reviewed by myself.   Differential diagnosis were considered with the presenting HPI.  Medications  sodium chloride 0.9 % bolus 1,000 mL (0 mLs Intravenous Stopped 03/28/23 1108)  acetaminophen (TYLENOL) tablet 1,000 mg (1,000 mg Oral Given 03/28/23 0918)  oxyCODONE (Oxy IR/ROXICODONE) immediate release tablet 2.5 mg (2.5 mg Oral Given 03/28/23 0918)  meclizine (ANTIVERT) tablet 25 mg (25 mg Oral Given 03/28/23 0918)  oxyCODONE (Oxy IR/ROXICODONE) immediate release tablet 5 mg (5 mg Oral Given 03/28/23 1129)    Vitals:   03/28/23 0844 03/28/23 0845 03/28/23 1030  BP: (!) 157/74  (!) 155/95  Pulse: 67  69  Resp: 15  (!) 23  Temp: 97.8 F (36.6 C)    TempSrc: Oral    SpO2: 96%  95%  Weight:  59 kg   Height:  4\' 11"  (1.499 m)     Final diagnoses:  Dizziness  Acute pain of left shoulder              Final Clinical Impression(s) / ED Diagnoses Final diagnoses:  Dizziness  Acute pain of left shoulder    Rx / DC Orders ED Discharge Orders          Ordered    cefpodoxime (VANTIN) 100 MG tablet  2 times daily        03/28/23 1108    diclofenac Sodium (VOLTAREN) 1 % GEL  4 times daily        03/28/23 1108              Melene Plan, DO 03/28/23 1136

## 2023-03-28 NOTE — ED Notes (Signed)
RT NOTE:  Pt O2 saturation at rest 96% on RA Pt O2 saturation while ambulating 91-93% on RA Adela Lank, MD made aware of pt results.

## 2023-03-28 NOTE — ED Notes (Signed)
 Pt going to MRI

## 2023-03-28 NOTE — ED Triage Notes (Signed)
Pt arrived POV, caox4, stating she is unable to ambulate d/t feeling dizzy. Pt reports primary complaint being L shoulder pain since falling Sunday d/t feeling light headed. Pt reports she has felt light headed for approx x2 days but is worse today. Last took tramadol for pain at 3a.

## 2023-03-28 NOTE — Discharge Instructions (Signed)
Take the [facility] for urinary tract infection.  Use Voltaren gel for your shoulder pain.  Stay well-hydrated.  Follow-up with your primary doctor in 2 to 3 days regarding her symptoms.  Return to the ED if you develop any concerning symptoms.

## 2023-03-28 NOTE — ED Notes (Signed)
Patient transported to MRI 

## 2023-03-28 NOTE — ED Provider Notes (Signed)
Patient seen on arrival.  Patient awaiting MRI, she is in no distress.  Discharge orders for antibiotic, analgesics have been written by Dr. Adela Lank.   Gerhard Munch, MD 03/28/23 [phone]

## 2023-03-30 DIAGNOSIS — M19012 Primary osteoarthritis, left shoulder: Secondary | ICD-10-CM | POA: Diagnosis not present

## 2023-03-30 DIAGNOSIS — Z6828 Body mass index (BMI) 28.0-28.9, adult: Secondary | ICD-10-CM | POA: Diagnosis not present

## 2023-03-30 DIAGNOSIS — Z09 Encounter for follow-up examination after completed treatment for conditions other than malignant neoplasm: Secondary | ICD-10-CM | POA: Diagnosis not present

## 2023-03-30 DIAGNOSIS — R600 Localized edema: Secondary | ICD-10-CM | POA: Diagnosis not present

## 2023-03-31 NOTE — Progress Notes (Signed)
GYNECOLOGY  VISIT   HPI: 89 y.o.   Married  Caucasian  female   [phone] with Patient's last menstrual period was 09/12/1973 (approximate).   here for   procidentia f/u.  Seen in hospital for cystitis on May 25 and then again on July 16. She is just finished a course of abx. She is having recurrent UTIs:  January, May and July.   She will see Dr. Sherron Monday tomorrow for her catheter care and exchange.  Catheter exchange can be sensitive.  She is asking about suprapubic catheterization.   She has used a indwelling foley for years due to her prolapse and inability to use a pessary.  She had severe vaginal bleeding from ulceration from chronic pessary use 3 years ago and she required surgery and transfusion of RBCs at that time.  A pelvic ultrasound 05/04/22 was done due to postmenopausal bleeding, and it was normal.  See Epic.  She has some drops of blood on her sanitary pads 3 - 4 times a week. She uses vitamin E liquid to moisturize the tissue.  She is on Eilquis.  GYNECOLOGIC HISTORY: Patient's last menstrual period was 09/12/1973 (approximate). Contraception:  PMP Menopausal hormone therapy:  n/a Last mammogram:  12/05/22 Breast Density Cat C, BI-RADS CAT 2 benign Last pap smear:   03/31/22 neg, 03/11/19 neg        OB History     Gravida  3   Para  3   Term  3   Preterm      AB      Living  3      SAB      IAB      Ectopic      Multiple      Live Births                 Patient Active Problem List   Diagnosis Date Noted   Hydronephrosis of left kidney 03/24/2023   Acute on chronic diastolic CHF (congestive heart failure) (HCC) 03/23/2023   CKD (chronic kidney disease), stage IIIb (HCC) 03/23/2023   Catheter-associated urinary tract infection (HCC) 02/05/2023   UTI (urinary tract infection) due to urinary indwelling catheter (HCC) 02/04/2023   Hyponatremia 12/16/2022   AKI (acute kidney injury) (HCC) 12/16/2022   Acute cystitis with hydronephrosis due  to indwelling foley catheter 12/15/2022   Acute respiratory failure with hypoxia (HCC) 09/03/2022   CAP (community acquired pneumonia) 09/03/2022   Complicated urinary tract infection 09/03/2022   Leukocytosis 09/03/2022   Open knee wound 10/05/2021   Heart failure with preserved ejection fraction (HCC) 08/18/2021   Palliative care by specialist    Goals of care, counseling/discussion    DNR (do not resuscitate) discussion    Vaginal bleeding 08/08/2020   Uterine prolapse 08/08/2020   Vaginal erosion secondary to pessary use (HCC) 08/08/2020   Acute urinary retention 08/08/2020   Chronic respiratory failure (HCC) 08/08/2020   Multiple pulmonary nodules 05/24/2020   Cough 05/24/2020   Malignant neoplasm of upper-inner quadrant of left breast in female, estrogen receptor negative (HCC) 12/20/2019   Malignant neoplasm of overlapping sites of right breast in female, estrogen receptor positive (HCC) 08/14/2019   Osteopenia 08/14/2019   Diplopia 01/29/2016   GERD (gastroesophageal reflux disease) 01/29/2016   Cerebrovascular accident (CVA) due to embolism of left middle cerebral artery (HCC) 01/29/2016   Bleeding gastrointestinal    Gait disturbance, post-stroke 01/27/2016   Fall    Secondary hypertension, unspecified    Cerebral infarction due  to thrombosis of left middle cerebral artery (HCC) s/p mechanical thrombectomy    Malnutrition of moderate degree 01/09/2016   GI bleed 01/06/2016   Permanent atrial fibrillation (HCC) 06/10/2015   Chronic anticoagulation 06/10/2015   Hyperlipidemia 06/10/2015   Essential hypertension 06/10/2015   History of stroke 06/10/2015   Coronary artery disease due to lipid rich plaque 06/10/2015   Elevated uric acid in blood    Arthritis of ankle    Pulmonary HTN (HCC)     Past Medical History:  Diagnosis Date   Afib (HCC)    Arthritis of ankle    CAD (coronary artery disease)    Cancer (HCC) 2012   breast cancer-right   Compression fracture  of T12 vertebra (HCC)    Diverticulosis    Edema    Elevated uric acid in blood    GERD (gastroesophageal reflux disease)    GI bleed 12/2015   Hyperlipidemia    Hypertension    Long-term (current) use of anticoagulants    Moderate mitral regurgitation    Personal history of radiation therapy    Pulmonary HTN (HCC)    Echo 1/19: EF 60-65, no RWMA, Ao sclerosis, trivial AI, mild MR, mod BAE, mod TR, PASP 49   Stroke (HCC) 01/25/2016   TIA (transient ischemic attack) 09/2011   Urinary incontinence    Urinary retention     Past Surgical History:  Procedure Laterality Date   APPENDECTOMY     AXILLARY SENTINEL NODE BIOPSY Left 01/22/2020   Procedure: Left Axillary Sentinel Node Biopsy;  Surgeon: Emelia Loron, MD;  Location: [facility] OR;  Service: General;  Laterality: Left;   BREAST LUMPECTOMY WITH RADIOACTIVE SEED AND SENTINEL LYMPH NODE BIOPSY Left 01/22/2020   Procedure: LEFT BREAST LUMPECTOMY WITH RADIOACTIVE SEED;  Surgeon: Emelia Loron, MD;  Location: [facility] OR;  Service: General;  Laterality: Left;   BREAST SURGERY  2012   Rt.mastectomy--[place], [place]   CARDIAC CATHETERIZATION  2013   CATARACT EXTRACTION, BILATERAL     CERVICAL CONIZATION W/BX N/A 08/08/2020   Procedure: VAGINAL SIDEWALL BIOPSY;  Surgeon: Jerene Bears, MD;  Location: [name] OR;  Service: Gynecology;  Laterality: N/A;   DILATION AND CURETTAGE OF UTERUS     in her early 15's for DUB   ESOPHAGOGASTRODUODENOSCOPY Left 01/08/2016   Procedure: ESOPHAGOGASTRODUODENOSCOPY (EGD);  Surgeon: Willis Modena, MD;  Location: [facility];  Service: Endoscopy;  Laterality: Left;   GALLBLADDER SURGERY     MASTECTOMY Right 2012   RADIOLOGY WITH ANESTHESIA N/A 01/25/2016   Procedure: RADIOLOGY WITH ANESTHESIA;  Surgeon: Julieanne Cotton, MD;  Location: [facility] OR;  Service: Radiology;  Laterality: N/A;   REPLACEMENT TOTAL KNEE BILATERAL     TONSILLECTOMY AND ADENOIDECTOMY     as a child    Current Outpatient Medications   Medication Sig Dispense Refill   acetaminophen (TYLENOL) 500 MG tablet Take 500-1,000 mg by mouth every 12 (twelve) hours as needed (pain).      albuterol (VENTOLIN HFA) 108 (90 Base) MCG/ACT inhaler Inhale 1 puff into the lungs every 6 (six) hours as needed for wheezing or shortness of breath (Cough). 18 g 2   allopurinol (ZYLOPRIM) 100 MG tablet Take 1 tablet (100 mg total) by mouth daily. 30 tablet 1   apixaban (ELIQUIS) 2.5 MG TABS tablet Take 1 tablet (2.5 mg total) by mouth 2 (two) times daily. 180 tablet 3   ascorbic acid (VITAMIN C) 500 MG tablet Take 500 mg by mouth 2 (two) times daily.  atorvastatin (LIPITOR) 10 MG tablet TAKE 1 TABLET DAILY 90 tablet 3   B Complex Vitamins (VITAMIN B COMPLEX) CAPS Take 1 capsule by mouth daily.     calcium carbonate (OSCAL) 1500 (600 Ca) MG TABS tablet Take 1,500 mg by mouth 2 (two) times daily with a meal.     cefpodoxime (VANTIN) 100 MG tablet 2 tablets with food Orally Twice a day     diclofenac Sodium (VOLTAREN) 1 % GEL Apply 4 g topically 4 (four) times daily. 100 g 0   diltiazem (CARDIZEM) 60 MG tablet TAKE 1 TABLET THREE TIMES A DAY 270 tablet 3   docusate sodium (COLACE) 100 MG capsule Take 100 mg by mouth 2 (two) times daily.     furosemide (LASIX) 20 MG tablet Take 1 tablet (20 mg total) by mouth daily. 90 tablet 3   metoprolol tartrate (LOPRESSOR) 100 MG tablet TAKE 1 TABLET TWICE A DAY 180 tablet 3   Multiple Vitamins-Minerals (MULTIVITAMIN WITH MINERALS) tablet Take 1 tablet by mouth daily.     omeprazole (PRILOSEC) 10 MG capsule Take 10 mg by mouth every other day.     polyethylene glycol (MIRALAX / GLYCOLAX) 17 g packet Take 17 g by mouth daily.     Probiotic Product (UP4 PROBIOTICS WOMENS PO) Take 1 capsule by mouth daily with breakfast.     sulfamethoxazole-trimethoprim (BACTRIM) 400-80 MG tablet Take 1 tablet by mouth 2 (two) times daily. 19 tablet 0   No current facility-administered medications for this visit.      ALLERGIES: Valium [diazepam], Amiodarone hcl [amiodarone], Compazine [prochlorperazine], Other, Thorazine [chlorpromazine], Zometa [zoledronic acid], Ciprofloxacin, Doxycycline, Zoster vac recomb adjuvanted, and Iodinated contrast media  Family History  Problem Relation Age of Onset   Asthma Mother    CVA Father    Atrial fibrillation Sister        HAS PACER   Pneumonia Brother    Cancer Daughter        COLON CANCER   Cancer Maternal Grandmother        breast cancer    Social History   Socioeconomic History   Marital status: Married    Spouse name: Not on file   Number of children: Not on file   Years of education: Not on file   Highest education level: Not on file  Occupational History   Not on file  Tobacco Use   Smoking status: Former    Current packs/day: 0.00    Average packs/day: 0.3 packs/day for 2.0 years (0.5 ttl pk-yrs)    Types: Cigarettes    Start date: 43    Quit date: 23    Years since quitting: 74.6   Smokeless tobacco: Never  Vaping Use   Vaping status: Never Used  Substance and Sexual Activity   Alcohol use: No    Alcohol/week: 0.0 standard drinks of alcohol   Drug use: No   Sexual activity: Not Currently    Partners: Male    Birth control/protection: Post-menopausal  Other Topics Concern   Not on file  Social History Narrative   Not on file   Social Determinants of Health   Financial Resource Strain: Not on file  Food Insecurity: No Food Insecurity (03/23/2023)   Hunger Vital Sign    Worried About Running Out of Food in the Last Year: Never true    Ran Out of Food in the Last Year: Never true  Transportation Needs: No Transportation Needs (03/23/2023)   PRAPARE - Transportation  Lack of Transportation (Medical): No    Lack of Transportation (Non-Medical): No  Physical Activity: Not on file  Stress: Not on file  Social Connections: Not on file  Intimate Partner Violence: Not At Risk (03/23/2023)   Humiliation, Afraid, Rape, and  Kick questionnaire    Fear of Current or Ex-Partner: No    Emotionally Abused: No    Physically Abused: No    Sexually Abused: No    Review of Systems  All other systems reviewed and are negative.   PHYSICAL EXAMINATION:    BP 118/84 (BP Location: Left Arm, Patient Position: Sitting, Cuff Size: Normal)   Pulse 64   Wt 134 lb (60.8 kg)   LMP 09/12/1973 (Approximate)   SpO2 97%   BMI 27.06 kg/m     General appearance: alert, cooperative and appears stated age   Pelvic: External genitalia:  no lesions              Urethra:  normal appearing urethra with no masses, tenderness or lesions.  Foley catheter in place.              Bartholins and Skenes: normal                 Vagina: normal appearing vagina with normal color and discharge, no lesions.  Complete procidentia.  Left vaginal apex with mild erythema of mucosa.  No ulceration.                Cervix: no lesions                Bimanual Exam:  Uterus:  normal size, contour, position, consistency, mobility, non-tender              Adnexa: no mass, fullness, tenderness   Chaperone was present for exam:  Warren Lacy, CMA  ASSESSMENT  Uterine procidentia. Bladder retention managed with foley catheterization.  Hx UTIs.  Followed by urology.   PLAN  We discussed colpocleisis as an alternative to prolonged catheterization.  I did discuss urinary incontinence can occur with reduction of the prolapse.  She will continue with indwelling catheter.  We also reviewed suprapubic catheterization, which I am not recommending.  FU with urology tomorrow for follow up UTI.  FU for breast and pelvic exam in one year and prn.   25 min  total time was spent for this patient encounter, including preparation, face-to-face counseling with the patient, coordination of care, and documentation of the encounter.

## 2023-04-03 DIAGNOSIS — M19212 Secondary osteoarthritis, left shoulder: Secondary | ICD-10-CM | POA: Diagnosis not present

## 2023-04-05 ENCOUNTER — Ambulatory Visit (INDEPENDENT_AMBULATORY_CARE_PROVIDER_SITE_OTHER): Payer: Medicare Other | Admitting: Obstetrics and Gynecology

## 2023-04-05 ENCOUNTER — Encounter: Payer: Self-pay | Admitting: Obstetrics and Gynecology

## 2023-04-05 VITALS — BP 118/84 | HR 64 | Wt 134.0 lb

## 2023-04-05 DIAGNOSIS — N813 Complete uterovaginal prolapse: Secondary | ICD-10-CM

## 2023-04-05 NOTE — Patient Instructions (Signed)
You may try Aquafor to place on your prolapse.   You may also want to try Good Clean Love to hydrate your prolapse also.

## 2023-04-06 DIAGNOSIS — R3914 Feeling of incomplete bladder emptying: Secondary | ICD-10-CM | POA: Diagnosis not present

## 2023-04-07 DIAGNOSIS — I5033 Acute on chronic diastolic (congestive) heart failure: Secondary | ICD-10-CM | POA: Diagnosis not present

## 2023-04-07 DIAGNOSIS — N390 Urinary tract infection, site not specified: Secondary | ICD-10-CM | POA: Diagnosis not present

## 2023-04-07 DIAGNOSIS — Z6827 Body mass index (BMI) 27.0-27.9, adult: Secondary | ICD-10-CM | POA: Diagnosis not present

## 2023-04-14 DIAGNOSIS — Z7189 Other specified counseling: Secondary | ICD-10-CM | POA: Diagnosis not present

## 2023-04-17 DIAGNOSIS — R0902 Hypoxemia: Secondary | ICD-10-CM | POA: Diagnosis not present

## 2023-04-17 DIAGNOSIS — G4730 Sleep apnea, unspecified: Secondary | ICD-10-CM | POA: Diagnosis not present

## 2023-04-18 DIAGNOSIS — D649 Anemia, unspecified: Secondary | ICD-10-CM | POA: Diagnosis not present

## 2023-04-20 DIAGNOSIS — I340 Nonrheumatic mitral (valve) insufficiency: Secondary | ICD-10-CM | POA: Insufficient documentation

## 2023-04-20 NOTE — Progress Notes (Addendum)
Cardiology Office Note:    Date:  04/21/2023  ID:  Adrienne [name], DOB [birth year], MRN [id number] PCP: Sigmund Hazel, MD  [facility] Providers Cardiologist:  Donato Schultz, MD       Patient Profile:      Coronary artery Ca2+ on CT  Notes indicate LHC in 10/2011 in [place]: nonobstructive CAD Permanent atrial fibrillation  Wt < 60 kg, Age > 80: Apixaban 2.5 mg twice daily  (HFpEF) heart failure with preserved ejection fraction  Left Bundle Branch Block  Mod mitral regurgitation  Atrial functional MR TTE 08/18/21: EF 55-60, mod MR  TTE 12/09/21: EF 55-60, mod MR TTE 09/04/22: EF 50-55, global HK, mod LVH, mildly reduced RVSF, mild LAE, mild-mod RAE, degen MV w mod to severe MR, AV sclerosis  Carotid US 12/06/17: Bilat ICA 1-39  Hx of TIA S/p L MCA CVA s/p thrombectomy (while off A/c due to GI bleeding) Pulmonary hypertension  Hypertension  Hyperlipidemia  Chronic kidney disease  Anemia  GERD Hx of uterine prolapse, chronic indwelling Foley cath R Breast CA            Discussed the use of AI scribe software for clinical note transcription with the patient, who gave verbal consent to proceed. History of Present Illness   A 89 year old patient with a history of heart failure, atrial fibrillation, mitral regurgitation, coronary artery calcification, recurrent UTIs, and anemia presents for a follow-up visit. The patient has had multiple hospital admissions over the past year with heart failure, UTIs. Most recently, the patient was admitted in July with decompensated heart failure and a recurrent UTI, managed with IV Lasix. The patient was discharged on home oxygen.  This has since been reduced to nightly only. The patient also went to the emergency room with a fall on July 16th.  She had left shoulder pain.  No fractures were identified. The patient has an indwelling catheter in place due to recurrent UTIs and is followed by urology and gynecology for uterine prolapse and  hydronephrosis.   The patient reports that her breathing has not worsened, but it has not improved since her last hospital admission. She reports shortness of breath with exercise, but her physical therapy sessions are not cut short due to this. She reports that her legs are swollen, but this has been a chronic issue for her.     ROS:  See HPI    Studies Reviewed:       EKG today demonstrates atrial fibrillation, HR 66, interventricular conduction delay, QTc 457, no change from prior tracing Risk Assessment/Calculations:    CHA2DS2-VASc Score = 8   This indicates a 10.8% annual risk of stroke. The patient's score is based upon: CHF History: 1 HTN History: 1 Diabetes History: 0 Stroke History: 2 Vascular Disease History: 1 Age Score: 2 Gender Score: 1            Physical Exam:   VS:  BP (!) 121/58   Pulse 66   Ht 4\' 11"  (1.499 m)   Wt 131 lb (59.4 kg)   LMP 09/12/1973 (Approximate)   SpO2 92%   BMI 26.46 kg/m    Wt Readings from Last 3 Encounters:  04/21/23 131 lb (59.4 kg)  04/05/23 134 lb (60.8 kg)  03/28/23 130 lb (59 kg)    GEN: Well nourished, well developed in no acute distress  NECK: +  JVD; + HJR CARDIAC: irreg irreg rhythm, no murmurs, rubs, gallops RESPIRATORY:  Clear to auscultation without rales, wheezing  or rhonchi  ABDOMEN: Soft EXTREMITIES:  1-2+ bilat LE edema     Assessment and Plan:  Heart failure with preserved ejection fraction (HCC) Recent hospitalization for decompensated heart failure.  She still has some evidence of volume excess.  She was taken off of spironolactone due to worsening renal function. -Increase Lasix to 40mg  on Monday, Wednesday, Friday, and continue 20mg  on other days. -Check basic metabolic panel today and in two weeks to monitor kidney function and electrolytes. -Keep follow-up with Dr. Anne Fu in October as planned  Permanent atrial fibrillation (HCC) Heart rate is controlled.  Based upon age and weight, continue Eliquis  2.5mg  twice daily.  Mitral regurgitation Last echocardiogram in December 2023 showed moderate to severe mitral regurgitation. -Repeat echocardiogram in December 2024 for year-to-year comparison.  Coronary artery disease due to lipid rich plaque No symptoms to suggest angina.  Continue Lipitor 10 mg daily.  CKD (chronic kidney disease), stage IIIb (HCC) Obtain BMET today and follow-up BMET in 2 weeks given adjustment in diuretics.        Dispo:  Return in 2 months (on 07/05/2023) for Scheduled Follow Up with Dr. Anne Fu.  Signed, Tereso Newcomer, PA-C

## 2023-04-21 ENCOUNTER — Ambulatory Visit: Payer: Medicare Other | Attending: Physician Assistant | Admitting: Physician Assistant

## 2023-04-21 ENCOUNTER — Encounter: Payer: Self-pay | Admitting: Physician Assistant

## 2023-04-21 VITALS — BP 121/58 | HR 66 | Ht 59.0 in | Wt 131.0 lb

## 2023-04-21 DIAGNOSIS — I4821 Permanent atrial fibrillation: Secondary | ICD-10-CM | POA: Diagnosis not present

## 2023-04-21 DIAGNOSIS — I5032 Chronic diastolic (congestive) heart failure: Secondary | ICD-10-CM | POA: Diagnosis not present

## 2023-04-21 DIAGNOSIS — I2510 Atherosclerotic heart disease of native coronary artery without angina pectoris: Secondary | ICD-10-CM

## 2023-04-21 DIAGNOSIS — N1832 Chronic kidney disease, stage 3b: Secondary | ICD-10-CM | POA: Insufficient documentation

## 2023-04-21 DIAGNOSIS — I340 Nonrheumatic mitral (valve) insufficiency: Secondary | ICD-10-CM

## 2023-04-21 DIAGNOSIS — I2583 Coronary atherosclerosis due to lipid rich plaque: Secondary | ICD-10-CM | POA: Insufficient documentation

## 2023-04-21 LAB — BASIC METABOLIC PANEL
BUN/Creatinine Ratio: 15 (ref 12–28)
BUN: 18 mg/dL (ref 10–36)
CO2: 27 mmol/L (ref 20–29)
Calcium: 9.4 mg/dL (ref 8.7–10.3)
Chloride: 100 mmol/L (ref 96–106)
Creatinine, Ser: 1.24 mg/dL — ABNORMAL HIGH (ref 0.57–1.00)
Glucose: 92 mg/dL (ref 70–99)
Potassium: 4.3 mmol/L (ref 3.5–5.2)
Sodium: 138 mmol/L (ref 134–144)
eGFR: 41 mL/min/{1.73_m2} — ABNORMAL LOW (ref >59)

## 2023-04-21 MED ORDER — FUROSEMIDE 20 MG PO TABS: ORAL_TABLET | ORAL | Status: DC

## 2023-04-21 NOTE — Assessment & Plan Note (Signed)
No symptoms to suggest angina.  Continue Lipitor 10 mg daily.

## 2023-04-21 NOTE — Assessment & Plan Note (Addendum)
Recent hospitalization for decompensated heart failure.  She still has some evidence of volume excess.  She was taken off of spironolactone due to worsening renal function. -Increase Lasix to 40mg  on Monday, Wednesday, Friday, and continue 20mg  on other days. -Check basic metabolic panel today and in two weeks to monitor kidney function and electrolytes. -Keep follow-up with Dr. Anne Fu in October as planned

## 2023-04-21 NOTE — Assessment & Plan Note (Signed)
Heart rate is controlled.  Based upon age and weight, continue Eliquis 2.5mg  twice daily.

## 2023-04-21 NOTE — Assessment & Plan Note (Signed)
Last echocardiogram in December 2023 showed moderate to severe mitral regurgitation. -Repeat echocardiogram in December 2024 for year-to-year comparison.

## 2023-04-21 NOTE — Assessment & Plan Note (Signed)
Obtain BMET today and follow-up BMET in 2 weeks given adjustment in diuretics.

## 2023-04-21 NOTE — Patient Instructions (Signed)
Medication Instructions:   CHANGE Lasix two (2) tablets by mouth ( 40 mg) M,W,F all other days one (1) tablet by mouth ( 20 mg) daily.  *If you need a refill on your cardiac medications before your next appointment, please call your pharmacy*   Lab Work:  TODAY!!!!BMET  Your physician recommends that you return for lab work ON Friday, August 23. You can come in on the day of your appointment anytime between 7:30-4:30.   If you have labs (blood work) drawn today and your tests are completely normal, you will receive your results only by: MyChart Message (if you have MyChart) OR A paper copy in the mail If you have any lab test that is abnormal or we need to change your treatment, we will call you to review the results.   Testing/Procedures:  Your physician has requested that you have an echocardiogram. Echocardiography is a painless test that uses sound waves to create images of your heart. It provides your doctor with information about the size and shape of your heart and how well your heart's chambers and valves are working. This procedure takes approximately one hour. There are no restrictions for this procedure. Please do NOT wear cologne, perfume or lotions (deodorant is allowed). Please arrive 15 minutes prior to your appointment time.    Follow-Up: At [facility], you and your health needs are our priority.  As part of our continuing mission to provide you with exceptional heart care, we have created designated Provider Care Teams.  These Care Teams include your primary Cardiologist (physician) and Advanced Practice Providers (APPs -  Physician Assistants and Nurse Practitioners) who all work together to provide you with the care you need, when you need it.  We recommend signing up for the patient portal called "MyChart".  Sign up information is provided on this After Visit Summary.  MyChart is used to connect with patients for Virtual Visits (Telemedicine).  Patients  are able to view lab/test results, encounter notes, upcoming appointments, etc.  Non-urgent messages can be sent to your provider as well.   To learn more about what you can do with MyChart, go to [email or web address].    Your next appointment:   2 month(s)  Provider:   Donato Schultz, MD

## 2023-05-03 DIAGNOSIS — N3020 Other chronic cystitis without hematuria: Secondary | ICD-10-CM | POA: Diagnosis not present

## 2023-05-04 ENCOUNTER — Other Ambulatory Visit: Payer: Self-pay

## 2023-05-04 ENCOUNTER — Inpatient Hospital Stay (HOSPITAL_COMMUNITY): Payer: Medicare Other

## 2023-05-04 ENCOUNTER — Encounter (HOSPITAL_COMMUNITY): Payer: Self-pay | Admitting: Internal Medicine

## 2023-05-04 ENCOUNTER — Inpatient Hospital Stay (HOSPITAL_COMMUNITY)
Admission: EM | Admit: 2023-05-04 | Discharge: 2023-05-06 | DRG: 291 | Disposition: A | Discharge status: Home / Self Care | Payer: Medicare Other | Attending: Internal Medicine | Admitting: Internal Medicine

## 2023-05-04 ENCOUNTER — Emergency Department (HOSPITAL_COMMUNITY): Payer: Medicare Other

## 2023-05-04 DIAGNOSIS — Z6826 Body mass index (BMI) 26.0-26.9, adult: Secondary | ICD-10-CM

## 2023-05-04 DIAGNOSIS — Y846 Urinary catheterization as the cause of abnormal reaction of the patient, or of later complication, without mention of misadventure at the time of the procedure: Secondary | ICD-10-CM | POA: Diagnosis present

## 2023-05-04 DIAGNOSIS — N814 Uterovaginal prolapse, unspecified: Secondary | ICD-10-CM | POA: Diagnosis present

## 2023-05-04 DIAGNOSIS — R310 Gross hematuria: Secondary | ICD-10-CM | POA: Diagnosis present

## 2023-05-04 DIAGNOSIS — E440 Moderate protein-calorie malnutrition: Secondary | ICD-10-CM | POA: Diagnosis present

## 2023-05-04 DIAGNOSIS — I447 Left bundle-branch block, unspecified: Secondary | ICD-10-CM | POA: Diagnosis present

## 2023-05-04 DIAGNOSIS — D72829 Elevated white blood cell count, unspecified: Secondary | ICD-10-CM

## 2023-05-04 DIAGNOSIS — R0989 Other specified symptoms and signs involving the circulatory and respiratory systems: Secondary | ICD-10-CM | POA: Diagnosis not present

## 2023-05-04 DIAGNOSIS — I5030 Unspecified diastolic (congestive) heart failure: Secondary | ICD-10-CM | POA: Diagnosis not present

## 2023-05-04 DIAGNOSIS — K219 Gastro-esophageal reflux disease without esophagitis: Secondary | ICD-10-CM | POA: Diagnosis present

## 2023-05-04 DIAGNOSIS — Z9011 Acquired absence of right breast and nipple: Secondary | ICD-10-CM

## 2023-05-04 DIAGNOSIS — N390 Urinary tract infection, site not specified: Secondary | ICD-10-CM | POA: Diagnosis not present

## 2023-05-04 DIAGNOSIS — N1831 Chronic kidney disease, stage 3a: Secondary | ICD-10-CM | POA: Diagnosis not present

## 2023-05-04 DIAGNOSIS — R0602 Shortness of breath: Secondary | ICD-10-CM | POA: Diagnosis not present

## 2023-05-04 DIAGNOSIS — I5033 Acute on chronic diastolic (congestive) heart failure: Secondary | ICD-10-CM | POA: Diagnosis present

## 2023-05-04 DIAGNOSIS — I2583 Coronary atherosclerosis due to lipid rich plaque: Secondary | ICD-10-CM | POA: Diagnosis not present

## 2023-05-04 DIAGNOSIS — R062 Wheezing: Secondary | ICD-10-CM | POA: Diagnosis not present

## 2023-05-04 DIAGNOSIS — R0689 Other abnormalities of breathing: Secondary | ICD-10-CM | POA: Diagnosis not present

## 2023-05-04 DIAGNOSIS — T83518A Infection and inflammatory reaction due to other urinary catheter, initial encounter: Secondary | ICD-10-CM | POA: Diagnosis present

## 2023-05-04 DIAGNOSIS — Z79899 Other long term (current) drug therapy: Secondary | ICD-10-CM

## 2023-05-04 DIAGNOSIS — N183 Chronic kidney disease, stage 3 unspecified: Secondary | ICD-10-CM | POA: Diagnosis present

## 2023-05-04 DIAGNOSIS — D649 Anemia, unspecified: Secondary | ICD-10-CM | POA: Diagnosis not present

## 2023-05-04 DIAGNOSIS — I509 Heart failure, unspecified: Principal | ICD-10-CM

## 2023-05-04 DIAGNOSIS — J811 Chronic pulmonary edema: Secondary | ICD-10-CM | POA: Diagnosis not present

## 2023-05-04 DIAGNOSIS — I340 Nonrheumatic mitral (valve) insufficiency: Secondary | ICD-10-CM | POA: Diagnosis not present

## 2023-05-04 DIAGNOSIS — E785 Hyperlipidemia, unspecified: Secondary | ICD-10-CM | POA: Diagnosis not present

## 2023-05-04 DIAGNOSIS — Z881 Allergy status to other antibiotic agents status: Secondary | ICD-10-CM

## 2023-05-04 DIAGNOSIS — E871 Hypo-osmolality and hyponatremia: Secondary | ICD-10-CM | POA: Diagnosis present

## 2023-05-04 DIAGNOSIS — N136 Pyonephrosis: Secondary | ICD-10-CM | POA: Diagnosis present

## 2023-05-04 DIAGNOSIS — E876 Hypokalemia: Secondary | ICD-10-CM | POA: Diagnosis not present

## 2023-05-04 DIAGNOSIS — I2510 Atherosclerotic heart disease of native coronary artery without angina pectoris: Secondary | ICD-10-CM | POA: Diagnosis not present

## 2023-05-04 DIAGNOSIS — Z1152 Encounter for screening for COVID-19: Secondary | ICD-10-CM

## 2023-05-04 DIAGNOSIS — I110 Hypertensive heart disease with heart failure: Secondary | ICD-10-CM | POA: Diagnosis not present

## 2023-05-04 DIAGNOSIS — Z66 Do not resuscitate: Secondary | ICD-10-CM | POA: Diagnosis present

## 2023-05-04 DIAGNOSIS — Z7901 Long term (current) use of anticoagulants: Secondary | ICD-10-CM

## 2023-05-04 DIAGNOSIS — T83511A Infection and inflammatory reaction due to indwelling urethral catheter, initial encounter: Secondary | ICD-10-CM | POA: Diagnosis not present

## 2023-05-04 DIAGNOSIS — I4891 Unspecified atrial fibrillation: Secondary | ICD-10-CM | POA: Diagnosis not present

## 2023-05-04 DIAGNOSIS — N3289 Other specified disorders of bladder: Secondary | ICD-10-CM | POA: Diagnosis present

## 2023-05-04 DIAGNOSIS — Z96653 Presence of artificial knee joint, bilateral: Secondary | ICD-10-CM | POA: Diagnosis present

## 2023-05-04 DIAGNOSIS — Z91041 Radiographic dye allergy status: Secondary | ICD-10-CM

## 2023-05-04 DIAGNOSIS — I130 Hypertensive heart and chronic kidney disease with heart failure and stage 1 through stage 4 chronic kidney disease, or unspecified chronic kidney disease: Principal | ICD-10-CM | POA: Diagnosis present

## 2023-05-04 DIAGNOSIS — Z9049 Acquired absence of other specified parts of digestive tract: Secondary | ICD-10-CM

## 2023-05-04 DIAGNOSIS — Z9841 Cataract extraction status, right eye: Secondary | ICD-10-CM

## 2023-05-04 DIAGNOSIS — I272 Pulmonary hypertension, unspecified: Secondary | ICD-10-CM | POA: Diagnosis present

## 2023-05-04 DIAGNOSIS — Z8673 Personal history of transient ischemic attack (TIA), and cerebral infarction without residual deficits: Secondary | ICD-10-CM | POA: Diagnosis not present

## 2023-05-04 DIAGNOSIS — I3481 Nonrheumatic mitral (valve) annulus calcification: Secondary | ICD-10-CM | POA: Diagnosis not present

## 2023-05-04 DIAGNOSIS — I4821 Permanent atrial fibrillation: Secondary | ICD-10-CM | POA: Diagnosis not present

## 2023-05-04 DIAGNOSIS — Z87891 Personal history of nicotine dependence: Secondary | ICD-10-CM | POA: Diagnosis not present

## 2023-05-04 DIAGNOSIS — N1832 Chronic kidney disease, stage 3b: Secondary | ICD-10-CM | POA: Diagnosis present

## 2023-05-04 DIAGNOSIS — I679 Cerebrovascular disease, unspecified: Secondary | ICD-10-CM | POA: Insufficient documentation

## 2023-05-04 DIAGNOSIS — Z9842 Cataract extraction status, left eye: Secondary | ICD-10-CM

## 2023-05-04 DIAGNOSIS — Z853 Personal history of malignant neoplasm of breast: Secondary | ICD-10-CM

## 2023-05-04 DIAGNOSIS — Z923 Personal history of irradiation: Secondary | ICD-10-CM

## 2023-05-04 DIAGNOSIS — Z885 Allergy status to narcotic agent status: Secondary | ICD-10-CM

## 2023-05-04 DIAGNOSIS — I10 Essential (primary) hypertension: Secondary | ICD-10-CM | POA: Diagnosis not present

## 2023-05-04 DIAGNOSIS — Z87440 Personal history of urinary (tract) infections: Secondary | ICD-10-CM

## 2023-05-04 DIAGNOSIS — R0902 Hypoxemia: Secondary | ICD-10-CM | POA: Diagnosis not present

## 2023-05-04 LAB — ECHOCARDIOGRAM COMPLETE
AR max vel: 2.85 cm2
AV Area VTI: 2.48 cm2
AV Area mean vel: 2.47 cm2
AV Mean grad: 7 mmHg
AV Peak grad: 12.8 mmHg
Ao pk vel: 1.79 m/s
Area-P 1/2: 5.27 cm2
Calc EF: 64.7 %
Height: 59 in
MV M vel: 4.16 m/s
MV Peak grad: 69.2 mmHg
MV VTI: 3.63 cm2
S' Lateral: 2.45 cm
Single Plane A2C EF: 67.7 %
Single Plane A4C EF: 67.2 %
Weight: 2095.25 oz

## 2023-05-04 LAB — CBC WITH DIFFERENTIAL/PLATELET
Abs Immature Granulocytes: 0.05 10*3/uL (ref 0.00–0.07)
Basophils Absolute: 0 10*3/uL (ref 0.0–0.1)
Basophils Relative: 0 %
Eosinophils Absolute: 0.1 10*3/uL (ref 0.0–0.5)
Eosinophils Relative: 1 %
HCT: 36.2 % (ref 36.0–46.0)
Hemoglobin: 11.6 g/dL — ABNORMAL LOW (ref 12.0–15.0)
Immature Granulocytes: 0 %
Lymphocytes Relative: 19 %
Lymphs Abs: 3.2 10*3/uL (ref 0.7–4.0)
MCH: 31.2 pg (ref 26.0–34.0)
MCHC: 32 g/dL (ref 30.0–36.0)
MCV: 97.3 fL (ref 80.0–100.0)
Monocytes Absolute: 0.5 10*3/uL (ref 0.1–1.0)
Monocytes Relative: 3 %
Neutro Abs: 12.9 10*3/uL — ABNORMAL HIGH (ref 1.7–7.7)
Neutrophils Relative %: 77 %
Platelets: 227 10*3/uL (ref 150–400)
RBC: 3.72 MIL/uL — ABNORMAL LOW (ref 3.87–5.11)
RDW: 14.4 % (ref 11.5–15.5)
WBC: 16.7 10*3/uL — ABNORMAL HIGH (ref 4.0–10.5)
nRBC: 0 % (ref 0.0–0.2)

## 2023-05-04 LAB — URINALYSIS, ROUTINE W REFLEX MICROSCOPIC
Bilirubin Urine: NEGATIVE
Glucose, UA: 50 mg/dL — AB
Ketones, ur: NEGATIVE mg/dL
Nitrite: NEGATIVE
Protein, ur: NEGATIVE mg/dL
Specific Gravity, Urine: 1.008 (ref 1.005–1.030)
WBC, UA: >50 WBC/hpf (ref 0–5)
pH: 6 (ref 5.0–8.0)

## 2023-05-04 LAB — COMPREHENSIVE METABOLIC PANEL
ALT: 14 U/L (ref 0–44)
AST: 21 U/L (ref 15–41)
Albumin: 3.7 g/dL (ref 3.5–5.0)
Alkaline Phosphatase: 80 U/L (ref 38–126)
Anion gap: 13 (ref 5–15)
BUN: 20 mg/dL (ref 8–23)
CO2: 24 mmol/L (ref 22–32)
Calcium: 9.1 mg/dL (ref 8.9–10.3)
Chloride: 95 mmol/L — ABNORMAL LOW (ref 98–111)
Creatinine, Ser: 1.11 mg/dL — ABNORMAL HIGH (ref 0.44–1.00)
GFR, Estimated: 46 mL/min — ABNORMAL LOW (ref >60)
Glucose, Bld: 139 mg/dL — ABNORMAL HIGH (ref 70–99)
Potassium: 3.3 mmol/L — ABNORMAL LOW (ref 3.5–5.1)
Sodium: 132 mmol/L — ABNORMAL LOW (ref 135–145)
Total Bilirubin: 0.6 mg/dL (ref 0.3–1.2)
Total Protein: 6.8 g/dL (ref 6.5–8.1)

## 2023-05-04 LAB — PROCALCITONIN: Procalcitonin: <0.1 ng/mL

## 2023-05-04 LAB — RESP PANEL BY RT-PCR (RSV, FLU A&B, COVID)  RVPGX2
Influenza A by PCR: NEGATIVE
Influenza B by PCR: NEGATIVE
Resp Syncytial Virus by PCR: NEGATIVE
SARS Coronavirus 2 by RT PCR: NEGATIVE

## 2023-05-04 LAB — TROPONIN I (HIGH SENSITIVITY)
Troponin I (High Sensitivity): 18 ng/L — ABNORMAL HIGH (ref <18)
Troponin I (High Sensitivity): 13 ng/L (ref <18)

## 2023-05-04 LAB — BRAIN NATRIURETIC PEPTIDE: B Natriuretic Peptide: 348.6 pg/mL — ABNORMAL HIGH (ref 0.0–100.0)

## 2023-05-04 MED ORDER — DILTIAZEM HCL 60 MG PO TABS
60.0000 mg | ORAL_TABLET | Freq: Three times a day (TID) | ORAL | Status: DC
  Administered 2023-05-04 – 2023-05-06 (×7): 60 mg via ORAL
  Filled 2023-05-04 (×7): qty 1

## 2023-05-04 MED ORDER — FUROSEMIDE 10 MG/ML IJ SOLN
40.0000 mg | Freq: Once | INTRAMUSCULAR | Status: AC
  Administered 2023-05-04: 40 mg via INTRAVENOUS
  Filled 2023-05-04: qty 4

## 2023-05-04 MED ORDER — SODIUM CHLORIDE 0.9 % IV SOLN
1.0000 g | INTRAVENOUS | Status: DC
  Administered 2023-05-04 – 2023-05-06 (×3): 1 g via INTRAVENOUS
  Filled 2023-05-04 (×3): qty 10

## 2023-05-04 MED ORDER — CHLORHEXIDINE GLUCONATE CLOTH 2 % EX PADS
6.0000 | MEDICATED_PAD | Freq: Every day | CUTANEOUS | Status: DC
  Administered 2023-05-04 – 2023-05-06 (×3): 6 via TOPICAL

## 2023-05-04 MED ORDER — SODIUM CHLORIDE 0.9% FLUSH
3.0000 mL | Freq: Two times a day (BID) | INTRAVENOUS | Status: DC
  Administered 2023-05-04 – 2023-05-06 (×5): 3 mL via INTRAVENOUS

## 2023-05-04 MED ORDER — METOPROLOL TARTRATE 100 MG PO TABS
100.0000 mg | ORAL_TABLET | Freq: Two times a day (BID) | ORAL | Status: DC
  Administered 2023-05-04 – 2023-05-06 (×5): 100 mg via ORAL
  Filled 2023-05-04 (×5): qty 1

## 2023-05-04 MED ORDER — FUROSEMIDE 10 MG/ML IJ SOLN: 20.0000 mg | Freq: Two times a day (BID) | INTRAMUSCULAR | Status: DC

## 2023-05-04 MED ORDER — APIXABAN 2.5 MG PO TABS
2.5000 mg | ORAL_TABLET | Freq: Two times a day (BID) | ORAL | Status: DC
  Administered 2023-05-04 – 2023-05-06 (×5): 2.5 mg via ORAL
  Filled 2023-05-04 (×5): qty 1

## 2023-05-04 MED ORDER — ACETAMINOPHEN 650 MG RE SUPP: 650.0000 mg | Freq: Four times a day (QID) | RECTAL | Status: DC | PRN

## 2023-05-04 MED ORDER — ACETAMINOPHEN 325 MG PO TABS
650.0000 mg | ORAL_TABLET | Freq: Four times a day (QID) | ORAL | Status: DC | PRN
  Administered 2023-05-04: 650 mg via ORAL
  Filled 2023-05-04: qty 2

## 2023-05-04 MED ORDER — LIVING BETTER WITH HEART FAILURE BOOK: Freq: Once | Status: AC

## 2023-05-04 MED ORDER — ALBUTEROL SULFATE (2.5 MG/3ML) 0.083% IN NEBU: 2.5000 mg | INHALATION_SOLUTION | Freq: Four times a day (QID) | RESPIRATORY_TRACT | Status: DC | PRN

## 2023-05-04 MED ORDER — FUROSEMIDE 10 MG/ML IJ SOLN
40.0000 mg | Freq: Two times a day (BID) | INTRAMUSCULAR | Status: AC
  Administered 2023-05-04 – 2023-05-05 (×3): 40 mg via INTRAVENOUS
  Filled 2023-05-04 (×3): qty 4

## 2023-05-04 MED ORDER — POTASSIUM CHLORIDE CRYS ER 20 MEQ PO TBCR
60.0000 meq | EXTENDED_RELEASE_TABLET | ORAL | Status: AC
  Administered 2023-05-04: 60 meq via ORAL
  Filled 2023-05-04: qty 6

## 2023-05-04 NOTE — Plan of Care (Signed)
°  Problem: Education: Goal: Ability to demonstrate management of disease process will improve Outcome: Progressing Goal: Ability to verbalize understanding of medication therapies will improve Outcome: Progressing Goal: Individualized Educational Video(s) Outcome: Progressing   Problem: Activity: Goal: Capacity to carry out activities will improve Outcome: Progressing   Problem: Cardiac: Goal: Ability to achieve and maintain adequate cardiopulmonary perfusion will improve Outcome: Progressing   Problem: Education: Goal: Knowledge of General Education information will improve Description: Including pain rating scale, medication(s)/side effects and non-pharmacologic comfort measures Outcome: Progressing   Problem: Health Behavior/Discharge Planning: Goal: Ability to manage health-related needs will improve Outcome: Progressing   Problem: Clinical Measurements: Goal: Ability to maintain clinical measurements within normal limits will improve Outcome: Progressing Goal: Will remain free from infection Outcome: Progressing Goal: Diagnostic test results will improve Outcome: Progressing Goal: Respiratory complications will improve Outcome: Progressing Goal: Cardiovascular complication will be avoided Outcome: Progressing   Problem: Activity: Goal: Risk for activity intolerance will decrease Outcome: Progressing   Problem: Nutrition: Goal: Adequate nutrition will be maintained Outcome: Progressing   Problem: Coping: Goal: Level of anxiety will decrease Outcome: Progressing   Problem: Elimination: Goal: Will not experience complications related to bowel motility Outcome: Progressing Goal: Will not experience complications related to urinary retention Outcome: Progressing   Problem: Pain Managment: Goal: General experience of comfort will improve Outcome: Progressing   Problem: Safety: Goal: Ability to remain free from injury will improve Outcome: Progressing    Problem: Skin Integrity: Goal: Risk for impaired skin integrity will decrease Outcome: Progressing   Problem: Education: Goal: Ability to demonstrate management of disease process will improve Outcome: Progressing Goal: Ability to verbalize understanding of medication therapies will improve Outcome: Progressing Goal: Individualized Educational Video(s) Outcome: Progressing   Problem: Activity: Goal: Capacity to carry out activities will improve Outcome: Progressing   Problem: Cardiac: Goal: Ability to achieve and maintain adequate cardiopulmonary perfusion will improve Outcome: Progressing

## 2023-05-04 NOTE — ED Provider Notes (Signed)
[place] EMERGENCY DEPARTMENT AT [facility] Provider Note  CSN: [id number] Arrival date & time: 05/04/23 0630  Chief Complaint(s) Shortness of Breath  HPI Adrienne [name] is a 89 y.o. female with PMH severe bladder prolapse with chronic indwelling Foley, chronic atrial fibrillation on Eliquis, CHF, pulmonary hypertension, CAD, CKD 3, previous CVA who presents to the emergency department for evaluation of shortness of breath.  Patient states that she is currently on Macrobid for a catheter associated UTI but had sudden onset shortness of breath this morning.  She was found to be in respiratory distress, hypoxic and wheezing by EMS.  Received 2 DuoNebs, methylprednisolone prior to arrival.  She states that she is starting to feel improved but still does arrive tachypneic with accessory muscle use.  Denies chest pain, abdominal pain, nausea, vomiting, headache, fever or other systemic symptoms.  Of note, patient recently follow-up with her cardiologist who increased her p.o. Lasix dosing.   Past Medical History Past Medical History:  Diagnosis Date   Afib (HCC)    Arthritis of ankle    CAD (coronary artery disease)    Cancer (HCC) 2012   breast cancer-right   Compression fracture of T12 vertebra (HCC)    Diverticulosis    Edema    Elevated uric acid in blood    GERD (gastroesophageal reflux disease)    GI bleed 12/2015   Hyperlipidemia    Hypertension    Long-term (current) use of anticoagulants    Moderate mitral regurgitation    Personal history of radiation therapy    Pulmonary HTN (HCC)    Echo 1/19: EF 60-65, no RWMA, Ao sclerosis, trivial AI, mild MR, mod BAE, mod TR, PASP 49   Stroke (HCC) 01/25/2016   TIA (transient ischemic attack) 09/2011   Urinary incontinence    Urinary retention    Patient Active Problem List   Diagnosis Date Noted   Mitral regurgitation 04/20/2023   Hydronephrosis of left kidney 03/24/2023   Acute on chronic diastolic CHF  (congestive heart failure) (HCC) 03/23/2023   CKD (chronic kidney disease), stage IIIb (HCC) 03/23/2023   Catheter-associated urinary tract infection (HCC) 02/05/2023   UTI (urinary tract infection) due to urinary indwelling catheter (HCC) 02/04/2023   Hyponatremia 12/16/2022   AKI (acute kidney injury) (HCC) 12/16/2022   Acute cystitis with hydronephrosis due to indwelling foley catheter 12/15/2022   Acute respiratory failure with hypoxia (HCC) 09/03/2022   CAP (community acquired pneumonia) 09/03/2022   Complicated urinary tract infection 09/03/2022   Leukocytosis 09/03/2022   Open knee wound 10/05/2021   Heart failure with preserved ejection fraction (HCC) 08/18/2021   Palliative care by specialist    Goals of care, counseling/discussion    DNR (do not resuscitate) discussion    Vaginal bleeding 08/08/2020   Uterine prolapse 08/08/2020   Vaginal erosion secondary to pessary use (HCC) 08/08/2020   Acute urinary retention 08/08/2020   Chronic respiratory failure (HCC) 08/08/2020   Multiple pulmonary nodules 05/24/2020   Cough 05/24/2020   Malignant neoplasm of upper-inner quadrant of left breast in female, estrogen receptor negative (HCC) 12/20/2019   Malignant neoplasm of overlapping sites of right breast in female, estrogen receptor positive (HCC) 08/14/2019   Osteopenia 08/14/2019   Diplopia 01/29/2016   GERD (gastroesophageal reflux disease) 01/29/2016   Cerebrovascular accident (CVA) due to embolism of left middle cerebral artery (HCC) 01/29/2016   Bleeding gastrointestinal    Gait disturbance, post-stroke 01/27/2016   Fall    Secondary hypertension, unspecified  Cerebral infarction due to thrombosis of left middle cerebral artery (HCC) s/p mechanical thrombectomy    Malnutrition of moderate degree 01/09/2016   GI bleed 01/06/2016   Permanent atrial fibrillation (HCC) 06/10/2015   Chronic anticoagulation 06/10/2015   Hyperlipidemia 06/10/2015   Essential hypertension  06/10/2015   History of stroke 06/10/2015   Coronary artery disease due to lipid rich plaque 06/10/2015   Elevated uric acid in blood    Arthritis of ankle    Pulmonary HTN (HCC)    Home Medication(s) Prior to Admission medications   Medication Sig Start Date End Date Taking? Authorizing Provider  acetaminophen (TYLENOL) 500 MG tablet Take 500-1,000 mg by mouth every 12 (twelve) hours as needed (pain).     [provider]  albuterol (VENTOLIN HFA) 108 (90 Base) MCG/ACT inhaler Inhale 1 puff into the lungs every 6 (six) hours as needed for wheezing or shortness of breath (Cough). 09/08/22   Regalado, Belkys A, MD  allopurinol (ZYLOPRIM) 100 MG tablet Take 1 tablet (100 mg total) by mouth daily. 09/08/22   Regalado, Belkys A, MD  apixaban (ELIQUIS) 2.5 MG TABS tablet Take 1 tablet (2.5 mg total) by mouth 2 (two) times daily. 10/07/22   Jake Bathe, MD  ascorbic acid (VITAMIN C) 500 MG tablet Take 500 mg by mouth 2 (two) times daily.    [provider]  atorvastatin (LIPITOR) 10 MG tablet TAKE 1 TABLET DAILY 11/28/22   Jake Bathe, MD  B Complex Vitamins (VITAMIN B COMPLEX) CAPS Take 1 capsule by mouth daily.    [provider]  calcium carbonate (OSCAL) 1500 (600 Ca) MG TABS tablet Take 1,500 mg by mouth 2 (two) times daily with a meal.    [provider]  cyanocobalamin (VITAMIN B12) 1000 MCG tablet Take 1,000 mcg by mouth daily.    [provider]  diltiazem (CARDIZEM) 60 MG tablet TAKE 1 TABLET THREE TIMES A DAY 11/28/22   Jake Bathe, MD  docusate sodium (COLACE) 100 MG capsule Take 100 mg by mouth 2 (two) times daily.    [provider]  Ferrous Sulfate (IRON) 325 (65 Fe) MG TABS Take 325 mg by mouth daily. (OTC)    [provider]  furosemide (LASIX) 20 MG tablet Take two (2) tablets by mouth ( 40 mg) M,W,F and one (1) tablet by mouth ( 20 mg) all other days. 04/21/23   Tereso Newcomer T, PA-C  metoprolol tartrate (LOPRESSOR)  100 MG tablet TAKE 1 TABLET TWICE A DAY 11/28/22   Jake Bathe, MD  Multiple Vitamins-Minerals (MULTIVITAMIN WITH MINERALS) tablet Take 1 tablet by mouth daily.    [provider]  omeprazole (PRILOSEC) 10 MG capsule Take 10 mg by mouth every other day. 03/21/19   [provider]  polyethylene glycol (MIRALAX / GLYCOLAX) 17 g packet Take 17 g by mouth daily.    [provider]  Probiotic Product (UP4 PROBIOTICS WOMENS PO) Take 1 capsule by mouth daily with breakfast.    [provider]  Past Surgical History Past Surgical History:  Procedure Laterality Date   APPENDECTOMY     AXILLARY SENTINEL NODE BIOPSY Left 01/22/2020   Procedure: Left Axillary Sentinel Node Biopsy;  Surgeon: Emelia Loron, MD;  Location: [facility] OR;  Service: General;  Laterality: Left;   BREAST LUMPECTOMY WITH RADIOACTIVE SEED AND SENTINEL LYMPH NODE BIOPSY Left 01/22/2020   Procedure: LEFT BREAST LUMPECTOMY WITH RADIOACTIVE SEED;  Surgeon: Emelia Loron, MD;  Location: [facility] OR;  Service: General;  Laterality: Left;   BREAST SURGERY  2012   Rt.mastectomy--[place], [place]   CARDIAC CATHETERIZATION  2013   CATARACT EXTRACTION, BILATERAL     CERVICAL CONIZATION W/BX N/A 08/08/2020   Procedure: VAGINAL SIDEWALL BIOPSY;  Surgeon: Jerene Bears, MD;  Location: [facility] OR;  Service: Gynecology;  Laterality: N/A;   DILATION AND CURETTAGE OF UTERUS     in her early 99's for DUB   ESOPHAGOGASTRODUODENOSCOPY Left 01/08/2016   Procedure: ESOPHAGOGASTRODUODENOSCOPY (EGD);  Surgeon: Willis Modena, MD;  Location: [facility];  Service: Endoscopy;  Laterality: Left;   GALLBLADDER SURGERY     MASTECTOMY Right 2012   RADIOLOGY WITH ANESTHESIA N/A 01/25/2016   Procedure: RADIOLOGY WITH ANESTHESIA;  Surgeon: Julieanne Cotton, MD;  Location: [facility] OR;  Service: Radiology;   Laterality: N/A;   REPLACEMENT TOTAL KNEE BILATERAL     TONSILLECTOMY AND ADENOIDECTOMY     as a child   Family History Family History  Problem Relation Age of Onset   Asthma Mother    CVA Father    Atrial fibrillation Sister        HAS PACER   Pneumonia Brother    Cancer Daughter        COLON CANCER   Cancer Maternal Grandmother        breast cancer    Social History Social History   Tobacco Use   Smoking status: Former    Current packs/day: 0.00    Average packs/day: 0.3 packs/day for 2.0 years (0.5 ttl pk-yrs)    Types: Cigarettes    Start date: 18    Quit date: 1950    Years since quitting: 74.6   Smokeless tobacco: Never  Vaping Use   Vaping status: Never Used  Substance Use Topics   Alcohol use: No    Alcohol/week: 0.0 standard drinks of alcohol   Drug use: No   Allergies Valium [diazepam], Amiodarone hcl [amiodarone], Compazine [prochlorperazine], Other, Thorazine [chlorpromazine], Zometa [zoledronic acid], Ciprofloxacin, Doxycycline, Zoster vac recomb adjuvanted, and Iodinated contrast media  Review of Systems Review of Systems  Respiratory:  Positive for cough, chest tightness and shortness of breath.   Cardiovascular:  Positive for leg swelling.    Physical Exam Vital Signs  I have reviewed the triage vital signs BP (!) 120/51   Pulse 87   Temp 98.4 F (36.9 C) (Oral)   Resp (!) 28   Ht 4\' 11"  (1.499 m)   Wt 59.4 kg   LMP 09/12/1973 (Approximate)   SpO2 94%   BMI 26.45 kg/m   Physical Exam Vitals and nursing note reviewed.  Constitutional:      General: She is not in acute distress.    Appearance: She is well-developed.  HENT:     Head: Normocephalic and atraumatic.  Eyes:     Conjunctiva/sclera: Conjunctivae normal.  Cardiovascular:     Rate and Rhythm: Normal rate and regular rhythm.     Heart sounds: No murmur heard. Pulmonary:     Effort: Tachypnea, accessory muscle usage and respiratory  distress present.     Breath sounds:  Normal breath sounds.  Abdominal:     Palpations: Abdomen is soft.     Tenderness: There is no abdominal tenderness.  Musculoskeletal:        General: No swelling.     Cervical back: Neck supple.  Skin:    General: Skin is warm and dry.     Capillary Refill: Capillary refill takes less than 2 seconds.  Neurological:     Mental Status: She is alert.  Psychiatric:        Mood and Affect: Mood normal.     ED Results and Treatments Labs (all labs ordered are listed, but only abnormal results are displayed) Labs Reviewed  COMPREHENSIVE METABOLIC PANEL - Abnormal; Notable for the following components:      Result Value   Sodium 132 (*)    Potassium 3.3 (*)    Chloride 95 (*)    Glucose, Bld 139 (*)    Creatinine, Ser 1.11 (*)    GFR, Estimated 46 (*)    All other components within normal limits  CBC WITH DIFFERENTIAL/PLATELET - Abnormal; Notable for the following components:   WBC 16.7 (*)    RBC 3.72 (*)    Hemoglobin 11.6 (*)    Neutro Abs 12.9 (*)    All other components within normal limits  BRAIN NATRIURETIC PEPTIDE - Abnormal; Notable for the following components:   B Natriuretic Peptide 348.6 (*)    All other components within normal limits  TROPONIN I (HIGH SENSITIVITY) - Abnormal; Notable for the following components:   Troponin I (High Sensitivity) 18 (*)    All other components within normal limits                                                                                                                          Radiology DG Chest Portable 1 View  Result Date: 05/04/2023 CLINICAL DATA:  Shortness of breath.  History of CHF EXAM: PORTABLE CHEST 1 VIEW COMPARISON:  Radiographs 03/28/2023 FINDINGS: Stable cardiomegaly. Aortic atherosclerotic calcification. Mitral annular calcification. Pulmonary venous congestion with interstitial coarsening greatest in the left lower lung. Left basilar atelectasis. No definite pleural effusion. No pneumothorax. No displaced rib  fractures. IMPRESSION: Cardiomegaly and mild interstitial edema. Electronically Signed   By: Minerva Fester M.D.   On: 05/04/2023 03:38    Pertinent labs & imaging results that were available during my care of the patient were reviewed by me and considered in my medical decision making (see MDM for details).  Medications Ordered in ED Medications  furosemide (LASIX) injection 40 mg (40 mg Intravenous Given 05/04/23 0325)  Procedures .Critical Care  Performed by: Glendora Score, MD Authorized by: Glendora Score, MD   Critical care provider statement:    Critical care time (minutes):  30   Critical care was necessary to treat or prevent imminent or life-threatening deterioration of the following conditions:  Respiratory failure   Critical care was time spent personally by me on the following activities:  Development of treatment plan with patient or surrogate, discussions with consultants, evaluation of patient's response to treatment, examination of patient, ordering and review of laboratory studies, ordering and review of radiographic studies, ordering and performing treatments and interventions, pulse oximetry, re-evaluation of patient's condition and review of old charts   (including critical care time)  Medical Decision Making / ED Course   This patient presents to the ED for concern of shortness of breath, this involves an extensive number of treatment options, and is a complaint that carries with it a high risk of complications and morbidity.  The differential diagnosis includes Pe, PTX, Pulmonary Edema, ARDS, COPD/Asthma, ACS, CHF exacerbation, Arrhythmia, Pericardial Effusion/Tamponade, Anemia, Sepsis, Acidosis/Hypercapnia, Anxiety, Viral URI  MDM: Patient seen emergency room for evaluation of shortness of breath.  Physical exam with accessory muscle  use, bilateral lower extremity pitting edema but no appreciable wheezing.  Laboratory evaluation with mild hypokalemia to 3.3, hyponatremia to 132, leukocytosis to 16.7, hemoglobin 11.6, BNP 348.6.  Chest x-ray with cardiomegaly and vascular congestion.  Patient placed on 2 L nasal cannula with significant improvement of respiratory status.  Lasix initiated and patient require hospital admission for CHF exacerbation and failure of outpatient Lasix.  Patient admitted.   Additional history obtained: -Additional history obtained from multiple family members -External records from outside source obtained and reviewed including: Chart review including previous notes, labs, imaging, consultation notes   Lab Tests: -I ordered, reviewed, and interpreted labs.   The pertinent results include:   Labs Reviewed  COMPREHENSIVE METABOLIC PANEL - Abnormal; Notable for the following components:      Result Value   Sodium 132 (*)    Potassium 3.3 (*)    Chloride 95 (*)    Glucose, Bld 139 (*)    Creatinine, Ser 1.11 (*)    GFR, Estimated 46 (*)    All other components within normal limits  CBC WITH DIFFERENTIAL/PLATELET - Abnormal; Notable for the following components:   WBC 16.7 (*)    RBC 3.72 (*)    Hemoglobin 11.6 (*)    Neutro Abs 12.9 (*)    All other components within normal limits  BRAIN NATRIURETIC PEPTIDE - Abnormal; Notable for the following components:   B Natriuretic Peptide 348.6 (*)    All other components within normal limits  TROPONIN I (HIGH SENSITIVITY) - Abnormal; Notable for the following components:   Troponin I (High Sensitivity) 18 (*)    All other components within normal limits     Imaging Studies ordered: I ordered imaging studies including CXR I independently visualized and interpreted imaging. I agree with the radiologist interpretation   Medicines ordered and prescription drug management: Meds ordered this encounter  Medications   furosemide (LASIX) injection  40 mg    -I have reviewed the patients home medicines and have made adjustments as needed  Critical interventions Lasix, supplemental oxygen    Cardiac Monitoring: The patient was maintained on a cardiac monitor.  I personally viewed and interpreted the cardiac monitored which showed an underlying rhythm of: NSR  Social Determinants of Health:  Factors impacting patients care include: none  Reevaluation: After the interventions noted above, I reevaluated the patient and found that they have :improved  Co morbidities that complicate the patient evaluation  Past Medical History:  Diagnosis Date   Afib (HCC)    Arthritis of ankle    CAD (coronary artery disease)    Cancer (HCC) 2012   breast cancer-right   Compression fracture of T12 vertebra (HCC)    Diverticulosis    Edema    Elevated uric acid in blood    GERD (gastroesophageal reflux disease)    GI bleed 12/2015   Hyperlipidemia    Hypertension    Long-term (current) use of anticoagulants    Moderate mitral regurgitation    Personal history of radiation therapy    Pulmonary HTN (HCC)    Echo 1/19: EF 60-65, no RWMA, Ao sclerosis, trivial AI, mild MR, mod BAE, mod TR, PASP 49   Stroke (HCC) 01/25/2016   TIA (transient ischemic attack) 09/2011   Urinary incontinence    Urinary retention       Dispostion: I considered admission for this patient, and patient require hospital admission for new oxygen requirement and CHF exacerbation     Final Clinical Impression(s) / ED Diagnoses Final diagnoses:  None     @PCDICTATION @    Glendora Score, MD 05/04/23 [phone]

## 2023-05-04 NOTE — H&P (Addendum)
History and Physical    Patient: Adrienne [name] XBM:[id number] DOB: Jun 05, 1928 DOA: 05/04/2023 DOS: the patient was seen and examined on 05/04/2023 PCP: Sigmund Hazel, MD  Patient coming from: Home (lives with husband) via EMS  Chief Complaint:  Chief Complaint  Patient presents with   Shortness of Breath   HPI: Adrienne [name] is a 89 y.o. female with medical history significant of hypertension, hyperlipidemia, permanent atrial fibrillation on chronic anticoagulation, CAD, HFpEF,  mitral regurgitation, pulmonary artery hypertension, utilizes 2 L nasal cannula oxygen at night, CVA, CKD stage IIIb, urinary retention with chronic indwelling Foley, recurrent urinary tract infections, history of breast cancer, and GERD who presents with complaints of shortness of breath.  Records note patient had just been seen by cardiology on 8/9 where patient was found to have excess fluid and recommended to increase Lasix to 40 mg on Monday, Wednesday, Friday, and continue 20 mg on other days. She had been taking her diuretics as prescribed and notes that her weight had been stable around 124-126.  She had been having bladder spasms along with burning with urination for which she had gone to the doctor and had been diagnosed with a urinary tract infection.  She was prescribed nitrofurantoin and took 1 dose yesterday.  Patient notes that she had noticed a little blood in her urine, but normally it is yellow and clear.  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  She tried propping herself up with a pillow without any improvement in her symptoms.  Denied having any fever, chest pain, nausea, vomiting, diarrhea, or significant chest pain.  Patient makes note that her Foley catheter was last changed sometime last month.  She had just recently completed DNR paperwork with her provider in the  last week.  In the emergency department patient was noted to be afebrile with respirations 22-30, and all other vital signs relatively maintained.  Labs significant for WBC 16.7, hemoglobin 11.6, sodium 132, potassium 3.3, BUN 20, creatinine 1.11, BNP 348.6, and high-sensitivity troponin 18->13.  Chest x-ray noted cardiomegaly with mild interstitial edema.  Patient was given Lasix 40 mg IV.   Review of Systems: As mentioned in the history of present illness. All other systems reviewed and are negative. Past Medical History:  Diagnosis Date   Afib (HCC)    Arthritis of ankle    CAD (coronary artery disease)    Cancer (HCC) 2012   breast cancer-right   Compression fracture of T12 vertebra (HCC)    Diverticulosis    Edema    Elevated uric acid in blood    GERD (gastroesophageal reflux disease)    GI bleed 12/2015   Hyperlipidemia    Hypertension    Long-term (current) use of anticoagulants    Moderate mitral regurgitation    Personal history of radiation therapy    Pulmonary HTN (HCC)    Echo 1/19: EF 60-65, no RWMA, Ao sclerosis, trivial AI, mild MR, mod BAE, mod TR, PASP 49   Stroke (HCC) 01/25/2016   TIA (transient ischemic attack) 09/2011   Urinary incontinence    Urinary retention    Past Surgical History:  Procedure Laterality Date   APPENDECTOMY     AXILLARY SENTINEL NODE BIOPSY Left 01/22/2020   Procedure: Left Axillary Sentinel Node Biopsy;  Surgeon: Emelia Loron, MD;  Location: [facility] OR;  Service: General;  Laterality: Left;   BREAST  LUMPECTOMY WITH RADIOACTIVE SEED AND SENTINEL LYMPH NODE BIOPSY Left 01/22/2020   Procedure: LEFT BREAST LUMPECTOMY WITH RADIOACTIVE SEED;  Surgeon: Emelia Loron, MD;  Location: [facility] OR;  Service: General;  Laterality: Left;   BREAST SURGERY  2012   Rt.mastectomy--[place], [place]   CARDIAC CATHETERIZATION  2013   CATARACT EXTRACTION, BILATERAL     CERVICAL CONIZATION W/BX N/A 08/08/2020   Procedure: VAGINAL SIDEWALL BIOPSY;  Surgeon:  Jerene Bears, MD;  Location: [facility] OR;  Service: Gynecology;  Laterality: N/A;   DILATION AND CURETTAGE OF UTERUS     in her early 54's for DUB   ESOPHAGOGASTRODUODENOSCOPY Left 01/08/2016   Procedure: ESOPHAGOGASTRODUODENOSCOPY (EGD);  Surgeon: Willis Modena, MD;  Location: [facility];  Service: Endoscopy;  Laterality: Left;   GALLBLADDER SURGERY     MASTECTOMY Right 2012   RADIOLOGY WITH ANESTHESIA N/A 01/25/2016   Procedure: RADIOLOGY WITH ANESTHESIA;  Surgeon: Julieanne Cotton, MD;  Location: [facility] OR;  Service: Radiology;  Laterality: N/A;   REPLACEMENT TOTAL KNEE BILATERAL     TONSILLECTOMY AND ADENOIDECTOMY     as a child   Social History:  reports that she quit smoking about 74 years ago. Her smoking use included cigarettes. She started smoking about 76 years ago. She has a 0.5 pack-year smoking history. She has never used smokeless tobacco. She reports that she does not drink alcohol and does not use drugs.  Allergies  Allergen Reactions   Valium [Diazepam] Other (See Comments)    HYPERACTIVITY    Amiodarone Hcl [Amiodarone] Other (See Comments)    A-Fib   Compazine [Prochlorperazine] Other (See Comments)    HYPERACTIVITY    Other Other (See Comments)    "Kidney dye"--patient states this gave her severe shakes/chills   Thorazine [Chlorpromazine] Other (See Comments)    HYPERACTIVITY    Zometa [Zoledronic Acid] Other (See Comments)    PROBLEMS WITH EYES  TIA   Ciprofloxacin Swelling and Other (See Comments)    "swelling" in Rt.elbow   Doxycycline Nausea Only   Zoster Vac Recomb Adjuvanted Other (See Comments)    Welt under injection site   Iodinated Contrast Media Anxiety and Other (See Comments)    Chills and anxiety  Other Reaction(s): chills/shaking, Kidney dye    Family History  Problem Relation Age of Onset   Asthma Mother    CVA Father    Atrial fibrillation Sister        HAS PACER   Pneumonia Brother    Cancer Daughter        COLON CANCER   Cancer  Maternal Grandmother        breast cancer    Prior to Admission medications   Medication Sig Start Date End Date Taking? Authorizing Provider  acetaminophen (TYLENOL) 500 MG tablet Take 500-1,000 mg by mouth every 12 (twelve) hours as needed (pain).     [provider]  albuterol (VENTOLIN HFA) 108 (90 Base) MCG/ACT inhaler Inhale 1 puff into the lungs every 6 (six) hours as needed for wheezing or shortness of breath (Cough). 09/08/22   Regalado, Belkys A, MD  allopurinol (ZYLOPRIM) 100 MG tablet Take 1 tablet (100 mg total) by mouth daily. 09/08/22   Regalado, Belkys A, MD  amoxicillin (AMOXIL) 500 MG capsule SMARTSIG:4 Capsule(s) By Mouth Once 04/25/23   [provider]  apixaban (ELIQUIS) 2.5 MG TABS tablet Take 1 tablet (2.5 mg total) by mouth 2 (two) times daily. 10/07/22   Jake Bathe, MD  ascorbic acid (VITAMIN C)  500 MG tablet Take 500 mg by mouth 2 (two) times daily.    [provider]  atorvastatin (LIPITOR) 10 MG tablet TAKE 1 TABLET DAILY 11/28/22   Jake Bathe, MD  B Complex Vitamins (VITAMIN B COMPLEX) CAPS Take 1 capsule by mouth daily.    [provider]  calcium carbonate (OSCAL) 1500 (600 Ca) MG TABS tablet Take 1,500 mg by mouth 2 (two) times daily with a meal.    [provider]  cyanocobalamin (VITAMIN B12) 1000 MCG tablet Take 1,000 mcg by mouth daily.    [provider]  diltiazem (CARDIZEM) 60 MG tablet TAKE 1 TABLET THREE TIMES A DAY 11/28/22   Jake Bathe, MD  docusate sodium (COLACE) 100 MG capsule Take 100 mg by mouth 2 (two) times daily.    [provider]  Ferrous Sulfate (IRON) 325 (65 Fe) MG TABS Take 325 mg by mouth daily. (OTC)    [provider]  furosemide (LASIX) 20 MG tablet Take two (2) tablets by mouth ( 40 mg) M,W,F and one (1) tablet by mouth ( 20 mg) all other days. 04/21/23   Tereso Newcomer T, PA-C  metoprolol tartrate (LOPRESSOR) 100 MG tablet TAKE 1 TABLET TWICE A DAY 11/28/22    Jake Bathe, MD  Multiple Vitamins-Minerals (MULTIVITAMIN WITH MINERALS) tablet Take 1 tablet by mouth daily.    [provider]  nitrofurantoin, macrocrystal-monohydrate, (MACROBID) 100 MG capsule Take 100 mg by mouth 2 (two) times daily. 05/03/23   [provider]  omeprazole (PRILOSEC) 10 MG capsule Take 10 mg by mouth every other day. 03/21/19   [provider]  polyethylene glycol (MIRALAX / GLYCOLAX) 17 g packet Take 17 g by mouth daily.    [provider]  Probiotic Product (UP4 PROBIOTICS WOMENS PO) Take 1 capsule by mouth daily with breakfast.    [provider]  traMADol (ULTRAM) 50 MG tablet Take 50 mg by mouth 2 (two) times daily as needed. 03/30/23   [provider]    Physical Exam: Vitals:   05/04/23 0515 05/04/23 0600 05/04/23 0615 05/04/23 0630  BP: (!) 119/57 (!) 130/91 (!) 119/51 (!) 120/54  Pulse: 82 88 90 86  Resp: (!) 27 (!) 30 (!) 29 (!) 26  Temp:    98.4 F (36.9 C)  TempSrc:    Oral  SpO2: 92% 95% 97% 96%  Weight:      Height:        Constitutional: Elderly female currently in no acute distress Eyes: PERRL, lids and conjunctivae normal.  Wearing glasses ENMT: Mucous membranes are moist.  Fair dentition. Neck: normal, supple, JVD present to the angle of the jaw. Respiratory: Coarse crackle appreciated in both lung fields.  Patient on 2 L nasal cannula oxygen with O2 saturations currently maintained.  Able to speak in complete sentences.   Cardiovascular: Irregular irregular.  1+ pitting bilateral lower extremity edema. abdomen: no tenderness, no masses palpated. Bowel sounds positive.  Musculoskeletal: no clubbing / cyanosis. No joint deformity upper and lower extremities. Good ROM, no contractures. Normal muscle tone.  Skin: no rashes, lesions, ulcers. No induration Neurologic: CN 2-12 grossly intact.  Strength 5/5 in all 4.  Psychiatric: Normal judgment and insight. Alert and oriented x 3. Normal mood.    Data Reviewed:  EKG revealed atrial fibrillation at 90 bpm with paired PVCs.  Reviewed labs, imaging, and pertinent records as noted above in HPI. Assessment and Plan:  Heart failure with preserved ejection  fraction Mitral regurgitation Acute on chronic.  BNP 348.6 which appears lower than prior hospitalization last month.  And last echocardiogram from 08/2022 noted EF to be 50 to 55% with global hypokinesis of the left ventricle, indeterminate diastolic function due to atrial fibrillation, and moderate to severe mitral valve regurgitation.  Patient has been given Lasix 40 mg IV x 1 dose. -Admit to a cardiac telemetry bed -Heart failure order set utilized -Strict I&O's and daily weights -Check echocardiogram -Lasix 20 mg IV twice daily due to soft blood pressures -Cardiology consulted, will follow-up for any additional recommendations  Complicated urinary tract infection with chronic indwelling Foley Prior to arrival.  Patient with chronic indwelling Foley due to urinary retention and incontinence related to urinary prolapse.  Patient had been diagnosed with a urinary tract infection yesterday and prescribed Macrobid for which she had taken 1 dose.  Previous culture significant for E. coli with resistance. -Check urinalysis and urine culture -Rocephin IV  Leukocytosis Acute.  WBC elevated at 16.9.  Suspect secondary to urinary tract infection -Recheck CBC tomorrow  Hypokalemia Acute.  Initial potassium 3.3. -Give 60 mEq of potassium chloride p.o. -Continue to monitor and replace as needed  Hyponatremia Acute on chronic.  Sodium 132 on admission.  Suspect secondary to hypervolemic hyponatremia in the setting of patient appearing to be fluid overloaded. -Continue to monitor with diuresis  Permanent atrial fibrillation on chronic anticoagulation Patient appears rate controlled at 90 bpm.   -Continue Eliquis -Goal potassium at least 4 and magnesium at least  2  CAD Hyperlipidemia Patient denies any complaints of chest pain. -Continue statin  History of stroke No residual symptoms reported.  Essential hypertension Blood pressures were noted to be as soft as 119/51. -Continue Cardizem and metoprolol as tolerated  Normocytic anemia Hemoglobin 11.6 which appears a little higher than previous baseline. -Continue to monitor  CKD stage IIIb On admission creatinine 1.1 which appears around patient's baseline to 1-1.2. -Continue to monitor kidney function with diuresis  DVT prophylaxis: Eliquis Advance Care Planning:   Code Status: DNR.  Confirmed wishes with the patient  Consults: Cardiology  Family Communication: Husband updated over the phone.  Severity of Illness: The appropriate patient status for this patient is INPATIENT. Inpatient status is judged to be reasonable and necessary in order to provide the required intensity of service to ensure the patient's safety. The patient's presenting symptoms, physical exam findings, and initial radiographic and laboratory data in the context of their chronic comorbidities is felt to place them at high risk for further clinical deterioration. Furthermore, it is not anticipated that the patient will be medically stable for discharge from the hospital within 2 midnights of admission.   * I certify that at the point of admission it is my clinical judgment that the patient will require inpatient hospital care spanning beyond 2 midnights from the point of admission due to high intensity of service, high risk for further deterioration and high frequency of surveillance required.*  Author: Clydie Braun, MD 05/04/2023 7:23 AM  For on call review [email or web address].

## 2023-05-04 NOTE — ED Notes (Signed)
Admitting is at bedside.

## 2023-05-04 NOTE — Plan of Care (Signed)
Problem: Education: Goal: Ability to demonstrate management of disease process will improve Outcome: Progressing   Problem: Activity: Goal: Capacity to carry out activities will improve Outcome: Progressing   Problem: Cardiac: Goal: Ability to achieve and maintain adequate cardiopulmonary perfusion will improve Outcome: Progressing   Problem: Coping: Goal: Level of anxiety will decrease Outcome: Progressing

## 2023-05-04 NOTE — Evaluation (Signed)
Physical Therapy Evaluation Patient Details Name: Adrienne [name] MRN: [id number] DOB: 1928-09-03 Today's Date: 05/04/2023  History of Present Illness  Pt is a 89 y/o female admitted 8/22 with increasing SOB and inability to sleep the night before admission.  PMH:  HTN, HLD, afib, CAD, HFpEF, mitral regurgitation, CVA, CKD 3B,  recurrent UTI, Breast CA,  Clinical Impression  Pt admitted with/for SOB.  Pt endorses much better, but not quite baseline, needing cga for basic OOB mobility.  Pt currently limited functionally due to the problems listed below.  (see problems list.)  Pt will benefit from PT to maximize function and safety to be able to get home safely with available assist .         If plan is discharge home, recommend the following: Assistance with cooking/housework   Can travel by private vehicle        Equipment Recommendations None recommended by PT  Recommendations for Other Services       Functional Status Assessment Patient has had a recent decline in their functional status and demonstrates the ability to make significant improvements in function in a reasonable and predictable amount of time.     Precautions / Restrictions Precautions Precautions: Fall      Mobility  Bed Mobility Overal bed mobility: Modified Independent             General bed mobility comments: mostly flattened bed, no rails, no assist    Transfers Overall transfer level: Needs assistance   Transfers: Sit to/from Stand Sit to Stand: Contact guard assist           General transfer comment: cues for hand placement for maximal safety    Ambulation/Gait Ambulation/Gait assistance: Contact guard assist Gait Distance (Feet): 100 Feet (x2 with the RW) Assistive device: Rolling walker (2 wheels)     Gait velocity interpretation: <1.8 ft/sec, indicate of risk for recurrent falls   General Gait Details: generally steady, good use of the RW.  Some drift.  Sats on RA  92-94% and HR 80's and 90's bpm  Stairs            Wheelchair Mobility     Tilt Bed    Modified Rankin (Stroke Patients Only)       Balance Overall balance assessment: No apparent balance deficits (not formally assessed)                                           Pertinent Vitals/Pain Pain Assessment Pain Assessment: Faces Faces Pain Scale: No hurt Pain Intervention(s): Monitored during session    Home Living Family/patient expects to be discharged to:: Private residence Living Arrangements: Spouse/significant other Available Help at Discharge: Family;Available 24 hours/day (MWF help for 4 hrs for iADL's) Type of Home: House Home Access: Level entry       Home Layout: One level Home Equipment: Educational psychologist (4 wheels);Rolling Walker (2 wheels);Grab bars - tub/shower      Prior Function Prior Level of Function : Independent/Modified Independent             Mobility Comments: used a rollator outside but no device use inside home ADLs Comments: groceries delivered, indep with dressing/bathing     Extremity/Trunk Assessment   Upper Extremity Assessment Upper Extremity Assessment: Overall WFL for tasks assessed    Lower Extremity Assessment Lower Extremity Assessment: Overall WFL for tasks assessed  Cervical / Trunk Assessment Cervical / Trunk Assessment: Normal  Communication   Communication Communication: No apparent difficulties  Cognition Arousal: Alert Behavior During Therapy: WFL for tasks assessed/performed Overall Cognitive Status: Within Functional Limits for tasks assessed                                          General Comments General comments (skin integrity, edema, etc.): vss    Exercises     Assessment/Plan    PT Assessment Patient needs continued PT services  PT Problem List Decreased activity tolerance;Decreased mobility;Cardiopulmonary status limiting activity       PT  Treatment Interventions Gait training;Functional mobility training;Therapeutic activities;Patient/family education    PT Goals (Current goals can be found in the Care Plan section)  Acute Rehab PT Goals Patient Stated Goal: Ready to get home when medically ready PT Goal Formulation: With patient Time For Goal Achievement: 05/18/23 Potential to Achieve Goals: Good    Frequency Min 1X/week     Co-evaluation               AM-PAC PT "6 Clicks" Mobility  Outcome Measure Help needed turning from your back to your side while in a flat bed without using bedrails?: None Help needed moving from lying on your back to sitting on the side of a flat bed without using bedrails?: None Help needed moving to and from a bed to a chair (including a wheelchair)?: A Little Help needed standing up from a chair using your arms (e.g., wheelchair or bedside chair)?: A Little Help needed to walk in hospital room?: A Little Help needed climbing 3-5 steps with a railing? : A Little 6 Click Score: 20    End of Session   Activity Tolerance: Patient tolerated treatment well Patient left: in chair;with call bell/phone within reach Nurse Communication: Mobility status PT Visit Diagnosis: Difficulty in walking, not elsewhere classified (R26.2)    Time: 1610-9604 PT Time Calculation (min) (ACUTE ONLY): 20 min   Charges:   PT Evaluation $PT Eval Moderate Complexity: 1 Mod   PT General Charges $$ ACUTE PT VISIT: 1 Visit         05/04/2023  Jacinto Halim., PT Acute Rehabilitation Services [phone]  (office)  Eliseo Gum Apolonio Cutting 05/04/2023, 2:53 PM

## 2023-05-04 NOTE — ED Notes (Signed)
ED TO INPATIENT HANDOFF REPORT  ED Nurse Name and Phone #: Lenell Antu Name/Age/Gender Adrienne [name] 89 y.o. female Room/Bed: TRABC/TRABC  Code Status   Code Status: Full Code  Home/SNF/Other Home Patient oriented to: self, place, time, and situation Is this baseline? Yes   Triage Complete: Triage complete  Chief Complaint (HFpEF) heart failure with preserved ejection fraction (HCC) [I50.30]  Triage Note Pt arrives to ED c/o SOB since 1900. Pt wears 2L at night with saturation of low 80's per EMS. Pt given 2 duo nebs and 125mg  of solumedrol with EMS. Hx of CHF, A-fib and hypertension    Allergies Allergies  Allergen Reactions   Valium [Diazepam] Other (See Comments)    HYPERACTIVITY    Amiodarone Hcl [Amiodarone] Other (See Comments)    A-Fib   Compazine [Prochlorperazine] Other (See Comments)    HYPERACTIVITY    Other Other (See Comments)    "Kidney dye"--patient states this gave her severe shakes/chills   Thorazine [Chlorpromazine] Other (See Comments)    HYPERACTIVITY    Zometa [Zoledronic Acid] Other (See Comments)    PROBLEMS WITH EYES  TIA   Ciprofloxacin Swelling and Other (See Comments)    "swelling" in Rt.elbow   Doxycycline Nausea Only   Zoster Vac Recomb Adjuvanted Other (See Comments)    Welt under injection site   Iodinated Contrast Media Anxiety and Other (See Comments)    Chills and anxiety  Other Reaction(s): chills/shaking, Kidney dye    Level of Care/Admitting Diagnosis ED Disposition     ED Disposition  Admit   Condition  --   Comment  Hospital [facility] [[id number]]  Level of Care: Telemetry Cardiac [[id number]]  May admit patient to [facility] or [facility] if equivalent level of care is available:: No  Covid Evaluation: Asymptomatic - no recent exposure (last 10 days) testing not required  Diagnosis: (HFpEF) heart failure with preserved ejection fraction [facility]) [[id number]]  Admitting Physician: Clydie Braun [[id number]]  Attending Physician: Clydie Braun [[id number]]  Certification:: I certify this patient will need inpatient services for at least 2 midnights  Expected Medical Readiness: 05/06/2023          B Medical/Surgery History Past Medical History:  Diagnosis Date   Afib (HCC)    Arthritis of ankle    CAD (coronary artery disease)    Cancer (HCC) 2012   breast cancer-right   Compression fracture of T12 vertebra (HCC)    Diverticulosis    Edema    Elevated uric acid in blood    GERD (gastroesophageal reflux disease)    GI bleed 12/2015   Hyperlipidemia    Hypertension    Long-term (current) use of anticoagulants    Moderate mitral regurgitation    Personal history of radiation therapy    Pulmonary HTN (HCC)    Echo 1/19: EF 60-65, no RWMA, Ao sclerosis, trivial AI, mild MR, mod BAE, mod TR, PASP 49   Stroke (HCC) 01/25/2016   TIA (transient ischemic attack) 09/2011   Urinary incontinence    Urinary retention    Past Surgical History:  Procedure Laterality Date   APPENDECTOMY     AXILLARY SENTINEL NODE BIOPSY Left 01/22/2020   Procedure: Left Axillary Sentinel Node Biopsy;  Surgeon: Emelia Loron, MD;  Location: [facility] OR;  Service: General;  Laterality: Left;   BREAST LUMPECTOMY WITH RADIOACTIVE SEED AND SENTINEL LYMPH NODE BIOPSY Left 01/22/2020   Procedure: LEFT BREAST LUMPECTOMY WITH RADIOACTIVE SEED;  Surgeon:  Emelia Loron, MD;  Location: [facility] OR;  Service: General;  Laterality: Left;   BREAST SURGERY  2012   Rt.mastectomy--[place], [place]   CARDIAC CATHETERIZATION  2013   CATARACT EXTRACTION, BILATERAL     CERVICAL CONIZATION W/BX N/A 08/08/2020   Procedure: VAGINAL SIDEWALL BIOPSY;  Surgeon: Jerene Bears, MD;  Location: [facility] OR;  Service: Gynecology;  Laterality: N/A;   DILATION AND CURETTAGE OF UTERUS     in her early 11's for DUB   ESOPHAGOGASTRODUODENOSCOPY Left 01/08/2016   Procedure: ESOPHAGOGASTRODUODENOSCOPY (EGD);  Surgeon: Willis Modena, MD;   Location: [facility];  Service: Endoscopy;  Laterality: Left;   GALLBLADDER SURGERY     MASTECTOMY Right 2012   RADIOLOGY WITH ANESTHESIA N/A 01/25/2016   Procedure: RADIOLOGY WITH ANESTHESIA;  Surgeon: Julieanne Cotton, MD;  Location: [facility] OR;  Service: Radiology;  Laterality: N/A;   REPLACEMENT TOTAL KNEE BILATERAL     TONSILLECTOMY AND ADENOIDECTOMY     as a child     A IV Location/Drains/Wounds Patient Lines/Drains/Airways Status     Active Line/Drains/Airways     Name Placement date Placement time Site Days   Peripheral IV 05/04/23 20 G 1" Anterior;Left;Proximal Forearm 05/04/23  0257  Forearm  less than 1   Urethral Catheter AGC 14 Fr. 02/04/23  1706  --  89            Intake/Output Last 24 hours No intake or output data in the 24 hours ending 05/04/23 0749  Labs/Imaging Results for orders placed or performed during the hospital encounter of 05/04/23 (from the past 48 hour(s))  Comprehensive metabolic panel     Status: Abnormal   Collection Time: 05/04/23  3:06 AM  Result Value Ref Range   Sodium 132 (L) 135 - 145 mmol/L   Potassium 3.3 (L) 3.5 - 5.1 mmol/L   Chloride 95 (L) 98 - 111 mmol/L   CO2 24 22 - 32 mmol/L   Glucose, Bld 139 (H) 70 - 99 mg/dL    Comment: Glucose reference range applies only to samples taken after fasting for at least 8 hours.   BUN 20 8 - 23 mg/dL   Creatinine, Ser 2.44 (H) 0.44 - 1.00 mg/dL   Calcium 9.1 8.9 - 01.0 mg/dL   Total Protein 6.8 6.5 - 8.1 g/dL   Albumin 3.7 3.5 - 5.0 g/dL   AST 21 15 - 41 U/L   ALT 14 0 - 44 U/L   Alkaline Phosphatase 80 38 - 126 U/L   Total Bilirubin 0.6 0.3 - 1.2 mg/dL   GFR, Estimated 46 (L) >60 mL/min    Comment: (NOTE) Calculated using the CKD-EPI Creatinine Equation (2021)    Anion gap 13 5 - 15    Comment: Performed at [facility], [address], [place], [place] [address]  Troponin I (High Sensitivity)     Status: Abnormal   Collection Time: 05/04/23  3:06 AM  Result Value Ref Range    Troponin I (High Sensitivity) 18 (H) <18 ng/L    Comment: (NOTE) Elevated high sensitivity troponin I (hsTnI) values and significant  changes across serial measurements may suggest ACS but many other  chronic and acute conditions are known to elevate hsTnI results.  Refer to the "Links" section for chest pain algorithms and additional  guidance. Performed at [facility], [address] [place], [place] [address]   CBC with Differential     Status: Abnormal   Collection Time: 05/04/23  3:06 AM  Result Value Ref Range   WBC 16.7 (H) 4.0 - 10.5 K/uL   RBC 3.72 (L) 3.87 - 5.11 MIL/uL   Hemoglobin 11.6 (L) 12.0 - 15.0 g/dL   HCT 30.8 65.7 - 84.6 %   MCV 97.3 80.0 - 100.0 fL   MCH 31.2 26.0 - 34.0 pg   MCHC 32.0 30.0 - 36.0 g/dL   RDW 96.2 95.2 - 84.1 %   Platelets 227 150 - 400 K/uL   nRBC 0.0 0.0 - 0.2 %   Neutrophils Relative % 77 %   Neutro Abs 12.9 (H) 1.7 - 7.7 K/uL   Lymphocytes Relative 19 %   Lymphs Abs 3.2 0.7 - 4.0 K/uL   Monocytes Relative 3 %   Monocytes Absolute 0.5 0.1 - 1.0 K/uL   Eosinophils Relative 1 %   Eosinophils Absolute 0.1 0.0 - 0.5 K/uL   Basophils Relative 0 %   Basophils Absolute 0.0 0.0 - 0.1 K/uL   Immature Granulocytes 0 %   Abs Immature Granulocytes 0.05 0.00 - 0.07 K/uL    Comment: Performed at [facility], [address] [place], [place] [address]  Brain natriuretic peptide     Status: Abnormal   Collection Time: 05/04/23  3:06 AM  Result Value Ref Range   B Natriuretic Peptide 348.6 (H) 0.0 - 100.0 pg/mL    Comment: Performed at [facility], [address], [place], [place] [address]  Resp panel by RT-PCR (RSV, Flu A&B, Covid) Anterior Nasal Swab     Status: None   Collection Time: 05/04/23  4:46 AM   Specimen: Anterior Nasal Swab  Result Value Ref Range   SARS Coronavirus 2 by RT PCR NEGATIVE NEGATIVE   Influenza A by PCR NEGATIVE NEGATIVE   Influenza B by PCR NEGATIVE NEGATIVE    Comment: (NOTE) The Xpert Xpress  SARS-CoV-2/FLU/RSV plus assay is intended as an aid in the diagnosis of influenza from Nasopharyngeal swab specimens and should not be used as a sole basis for treatment. Nasal washings and aspirates are unacceptable for Xpert Xpress SARS-CoV-2/FLU/RSV testing.  Fact Sheet for Patients: BloggerCourse.com  Fact Sheet for Healthcare Providers: SeriousBroker.it  This test is not yet approved or cleared by the [place] FDA and has been authorized for detection and/or diagnosis of SARS-CoV-2 by FDA under an Emergency Use Authorization (EUA). This EUA will remain in effect (meaning this test can be used) for the duration of the COVID-19 declaration under Section 564(b)(1) of the Act, 21 U.S.C. section 360bbb-3(b)(1), unless the authorization is terminated or revoked.     Resp Syncytial Virus by PCR NEGATIVE NEGATIVE    Comment: (NOTE) Fact Sheet for Patients: BloggerCourse.com  Fact Sheet for Healthcare Providers: SeriousBroker.it  This test is not yet approved or cleared by the [place] FDA and has been authorized for detection and/or diagnosis of SARS-CoV-2 by FDA under an Emergency Use Authorization (EUA). This EUA will remain in effect (meaning this test can be used) for the duration of the COVID-19 declaration under Section 564(b)(1) of the Act, 21 U.S.C. section 360bbb-3(b)(1), unless the authorization is terminated or revoked.  Performed at [facility], [address], [place], [place] [address]   Troponin I (High Sensitivity)     Status: None   Collection Time: 05/04/23  5:26 AM  Result Value Ref Range   Troponin I (High Sensitivity) 13 <18 ng/L    Comment: (NOTE) Elevated high sensitivity troponin I (hsTnI) values and significant  changes  across serial measurements may suggest ACS but many other  chronic and acute conditions are known to elevate hsTnI  results.  Refer to the "Links" section for chest pain algorithms and additional  guidance. Performed at [facility], [address] [place], [place] [address]    DG Chest Portable 1 View  Result Date: 05/04/2023 CLINICAL DATA:  Shortness of breath.  History of CHF EXAM: PORTABLE CHEST 1 VIEW COMPARISON:  Radiographs 03/28/2023 FINDINGS: Stable cardiomegaly. Aortic atherosclerotic calcification. Mitral annular calcification. Pulmonary venous congestion with interstitial coarsening greatest in the left lower lung. Left basilar atelectasis. No definite pleural effusion. No pneumothorax. No displaced rib fractures. IMPRESSION: Cardiomegaly and mild interstitial edema. Electronically Signed   By: Minerva Fester M.D.   On: 05/04/2023 03:38    Pending Labs Unresulted Labs (From admission, onward)     Start     Ordered   05/05/23 0500  CBC  Tomorrow morning,   R        05/04/23 0744   05/05/23 0500  Basic metabolic panel  Daily,   R      05/04/23 0744   05/04/23 0738  Procalcitonin  Once,   R       References:    Procalcitonin Lower Respiratory Tract Infection AND Sepsis Procalcitonin Algorithm   05/04/23 0744   05/04/23 0732  Urinalysis, Routine w reflex microscopic -Urine, Catheterized; Indwelling urinary catheter  Once,   R       Question Answer Comment  Specimen Source Urine, Catheterized   Specimen Source Indwelling urinary catheter      05/04/23 0732            Vitals/Pain Today's Vitals   05/04/23 0600 05/04/23 0615 05/04/23 0630 05/04/23 0715  BP: (!) 130/91 (!) 119/51 (!) 120/54   Pulse: 88 90 86   Resp: (!) 30 (!) 29 (!) 26   Temp:   98.4 F (36.9 C)   TempSrc:   Oral   SpO2: 95% 97% 96%   Weight:      Height:      PainSc:   0-No pain Asleep    Isolation Precautions No active isolations  Medications Medications  apixaban (ELIQUIS) tablet 2.5 mg (has no administration in time range)  sodium chloride flush (NS) 0.9 % injection 3 mL (has no  administration in time range)  furosemide (LASIX) injection 20 mg (has no administration in time range)  potassium chloride SA (KLOR-CON M) CR tablet 60 mEq (has no administration in time range)  acetaminophen (TYLENOL) tablet 650 mg (has no administration in time range)    Or  acetaminophen (TYLENOL) suppository 650 mg (has no administration in time range)  albuterol (PROVENTIL) (2.5 MG/3ML) 0.083% nebulizer solution 2.5 mg (has no administration in time range)  furosemide (LASIX) injection 40 mg (40 mg Intravenous Given 05/04/23 0325)    Mobility walks with device     Focused Assessments Pulmonary Assessment Handoff:  Lung sounds: Bilateral Breath Sounds: Diminished L Breath Sounds: Diminished R Breath Sounds: Diminished O2 Device: Room Air O2 Flow Rate (L/min): 2 L/min    R Recommendations: See Admitting Provider Note  Report given to:   Additional Notes:

## 2023-05-04 NOTE — Consult Note (Addendum)
Cardiology Consultation   Patient ID: Adrienne [name] MRN: [id number]; DOB: Sep 13, 1927  Admit date: 05/04/2023 Date of Consult: 05/04/2023  PCP:  Sigmund Hazel, MD   [facility] Providers Cardiologist:  Donato Schultz, MD   {  Patient Profile:   Adrienne [name] is a 89 y.o. female with a hx of nonobstructive CAD noted on catheterization in 2013, permanent atrial fibrillation, chronic HFpEF, pulm htn, left branch block, moderate MR, mild carotid artery stenosis, CVA status post left MCA thrombectomy while off anticoagulation due to GI bleed, breast cancer, hypertension, hyperlipidemia, CKD stage IIIb, anemia, recurrent UTIs with indwelling catheter and followed by urology and gynecology for uterine prolapse and hydronephrosis, who is being seen 05/04/2023 for the evaluation of heart failure exacerbation at the request of Dr. Katrinka Blazing.  History of Present Illness:   Adrienne [name] is is followed by Dr. Anne Fu and has had multiple ER admissions over the course of the past year for heart failure exacerbations, UTIs, PNA.  Most recent admission was in July where she had reported weight gain and was diuresed and discharged on oral diuretics and supplemental oxygen.  Her spironolactone was discontinued due to worsening renal function.  She was seen by Tereso Newcomer earlier this month on the ninth.  At that time patient had been complaining of persistent shortness of breath that had not improved since her last hospitalization.  She had stated that she was functionally limited and had to cut her physical therapy session short because of this and had complaints of peripheral edema however some extent of this is chronic.  At her office visit she was noted to have some evidence of hypervolemia.  She was started on p.o. Lasix 40 mg on Monday Wednesday and Friday.  20 mg on the other days.  She reports taking her medications as prescribed and has not noted any significant weight gain.   However, she is reporting orthopnea and not able to sleep because of this.  She has an active UTI currently and being treated with IV antibiotics.  She is also recently filed DNR paperwork.  She was given 1 dose of IV Lasix 40 mg.  Today Ms. Adrienne [name] is accompanied with her husband who she has been married for over 70 years.  Patient does pretty well functionally and moves around with a walker.  She has noted more shortness of breath despite compliance with her medications.  She is uncertain whether or not she pees more or less on the Lasix as her urine goes to her catheter.  She states that she feels significantly better after getting diuretics today.  Still has some shortness of breath and speaks in short bursts but feels better.  Still complains of orthopnea.  Lab work and imaging: BNP 348.6, troponins 18-13.  Chest x-ray showing mild interstitial edema.  WBC 16.7, hemoglobin 11.6.  Creatinine 1.1.   Past Medical History:  Diagnosis Date   Afib (HCC)    Arthritis of ankle    CAD (coronary artery disease)    Cancer (HCC) 2012   breast cancer-right   Compression fracture of T12 vertebra (HCC)    Diverticulosis    Edema    Elevated uric acid in blood    GERD (gastroesophageal reflux disease)    GI bleed 12/2015   Hyperlipidemia    Hypertension    Long-term (current) use of anticoagulants    Moderate mitral regurgitation    Personal history of radiation therapy    Pulmonary HTN (HCC)  Echo 1/19: EF 60-65, no RWMA, Ao sclerosis, trivial AI, mild MR, mod BAE, mod TR, PASP 49   Stroke (HCC) 01/25/2016   TIA (transient ischemic attack) 09/2011   Urinary incontinence    Urinary retention     Past Surgical History:  Procedure Laterality Date   APPENDECTOMY     AXILLARY SENTINEL NODE BIOPSY Left 01/22/2020   Procedure: Left Axillary Sentinel Node Biopsy;  Surgeon: Emelia Loron, MD;  Location: [facility] OR;  Service: General;  Laterality: Left;   BREAST LUMPECTOMY WITH RADIOACTIVE SEED  AND SENTINEL LYMPH NODE BIOPSY Left 01/22/2020   Procedure: LEFT BREAST LUMPECTOMY WITH RADIOACTIVE SEED;  Surgeon: Emelia Loron, MD;  Location: [facility] OR;  Service: General;  Laterality: Left;   BREAST SURGERY  2012   Rt.mastectomy--[place], [place]   CARDIAC CATHETERIZATION  2013   CATARACT EXTRACTION, BILATERAL     CERVICAL CONIZATION W/BX N/A 08/08/2020   Procedure: VAGINAL SIDEWALL BIOPSY;  Surgeon: Jerene Bears, MD;  Location: [facility] OR;  Service: Gynecology;  Laterality: N/A;   DILATION AND CURETTAGE OF UTERUS     in her early 81's for DUB   ESOPHAGOGASTRODUODENOSCOPY Left 01/08/2016   Procedure: ESOPHAGOGASTRODUODENOSCOPY (EGD);  Surgeon: Willis Modena, MD;  Location: [facility];  Service: Endoscopy;  Laterality: Left;   GALLBLADDER SURGERY     MASTECTOMY Right 2012   RADIOLOGY WITH ANESTHESIA N/A 01/25/2016   Procedure: RADIOLOGY WITH ANESTHESIA;  Surgeon: Julieanne Cotton, MD;  Location: [facility] OR;  Service: Radiology;  Laterality: N/A;   REPLACEMENT TOTAL KNEE BILATERAL     TONSILLECTOMY AND ADENOIDECTOMY     as a child     Inpatient Medications: Scheduled Meds:  apixaban  2.5 mg Oral BID   Chlorhexidine Gluconate Cloth  6 each Topical Daily   diltiazem  60 mg Oral TID   furosemide  20 mg Intravenous BID   metoprolol tartrate  100 mg Oral BID   sodium chloride flush  3 mL Intravenous Q12H   Continuous Infusions:  cefTRIAXone (ROCEPHIN)  IV Stopped (05/04/23 1123)   PRN Meds: acetaminophen **OR** acetaminophen, albuterol  Allergies:    Allergies  Allergen Reactions   Valium [Diazepam] Other (See Comments)    HYPERACTIVITY    Amiodarone Hcl [Amiodarone] Other (See Comments)    A-Fib   Compazine [Prochlorperazine] Other (See Comments)    HYPERACTIVITY    Other Other (See Comments)    "Kidney dye"--patient states this gave her severe shakes/chills   Thorazine [Chlorpromazine] Other (See Comments)    HYPERACTIVITY    Zometa [Zoledronic Acid] Other (See Comments)     PROBLEMS WITH EYES  TIA   Ciprofloxacin Swelling and Other (See Comments)    "swelling" in Rt.elbow   Doxycycline Nausea Only   Zoster Vac Recomb Adjuvanted Other (See Comments)    Welt under injection site   Iodinated Contrast Media Anxiety and Other (See Comments)    Chills and anxiety  Other Reaction(s): chills/shaking, Kidney dye    Social History:   Social History   Socioeconomic History   Marital status: Married    Spouse name: Not on file   Number of children: Not on file   Years of education: Not on file   Highest education level: Not on file  Occupational History   Not on file  Tobacco Use   Smoking status: Former    Current packs/day: 0.00    Average packs/day: 0.3 packs/day for 2.0 years (0.5 ttl pk-yrs)    Types: Cigarettes    Start  date: 29    Quit date: 31    Years since quitting: 74.6   Smokeless tobacco: Never  Vaping Use   Vaping status: Never Used  Substance and Sexual Activity   Alcohol use: No    Alcohol/week: 0.0 standard drinks of alcohol   Drug use: No   Sexual activity: Not Currently    Partners: Male    Birth control/protection: Post-menopausal  Other Topics Concern   Not on file  Social History Narrative   Not on file   Social Determinants of Health   Financial Resource Strain: Not on file  Food Insecurity: No Food Insecurity (05/04/2023)   Hunger Vital Sign    Worried About Running Out of Food in the Last Year: Never true    Ran Out of Food in the Last Year: Never true  Transportation Needs: No Transportation Needs (05/04/2023)   PRAPARE - Transportation    Lack of Transportation (Medical): No    Lack of Transportation (Non-Medical): No  Physical Activity: Not on file  Stress: Not on file  Social Connections: Not on file  Intimate Partner Violence: Not At Risk (05/04/2023)   Humiliation, Afraid, Rape, and Kick questionnaire    Fear of Current or Ex-Partner: No    Emotionally Abused: No    Physically Abused: No    Sexually  Abused: No    Family History:   Family History  Problem Relation Age of Onset   Asthma Mother    CVA Father    Atrial fibrillation Sister        HAS PACER   Pneumonia Brother    Cancer Daughter        COLON CANCER   Cancer Maternal Grandmother        breast cancer     ROS:  Please see the history of present illness.  All other ROS reviewed and negative.     Physical Exam/Data:   Vitals:   05/04/23 0630 05/04/23 0735 05/04/23 0800 05/04/23 0838  BP: (!) 120/54  125/64 (!) 132/59  Pulse: 86  94 91  Resp: (!) 26  (!) 24 20  Temp: 98.4 F (36.9 C)   97.8 F (36.6 C)  TempSrc: Oral   Oral  SpO2: 96% 93% 97% 94%  Weight:      Height:        Intake/Output Summary (Last 24 hours) at 05/04/2023 1515 Last data filed at 05/04/2023 1300 Gross per 24 hour  Intake 1060 ml  Output 750 ml  Net 310 ml      05/04/2023    2:56 AM 04/21/2023   10:19 AM 04/05/2023    1:35 PM  Last 3 Weights  Weight (lbs) 130 lb 15.3 oz 131 lb 134 lb  Weight (kg) 59.4 kg 59.421 kg 60.782 kg     Body mass index is 26.45 kg/m.  General:  Well nourished, well developed, in no acute distress HEENT: normal Neck: no JVD Vascular: No carotid bruits; Distal pulses 2+ bilaterally Cardiac: Irregularly irregular Lungs:  clear to auscultation bilaterally, no wheezing, rhonchi or rales  Abd: soft, nontender, no hepatomegaly  Ext: 1+ pitting edema  Musculoskeletal:  No deformities, BUE and BLE strength normal and equal Skin: warm and dry  Neuro:  CNs 2-12 intact, no focal abnormalities noted Psych:  Normal affect   EKG:  The EKG was personally reviewed and demonstrates: Atrial fibrillation, heart rate 80, IVCD.  Minimal ST depression in lateral leads. Telemetry:  Telemetry was personally reviewed and demonstrates: Atrial  fibrillation heart rate 70-100  Relevant CV Studies: Echocardiogram 09/04/2022 1. Left ventricular ejection fraction, by estimation, is 50 to 55%. The  left ventricle has low normal  function. The left ventricle demonstrates  global hypokinesis. There is moderate left ventricular hypertrophy of the  basal-septal segment. Left  ventricular diastolic function could not be evaluated.   2. Right ventricular systolic function is mildly reduced. The right  ventricular size is moderately enlarged.   3. Left atrial size was mildly dilated.   4. Right atrial size was mild to moderately dilated.   5. The mitral valve is degenerative. Moderate to severe mitral valve  regurgitation. No evidence of mitral stenosis.   6. The aortic valve is tricuspid. Aortic valve regurgitation is not  visualized. Aortic valve sclerosis/calcification is present, without any  evidence of aortic stenosis.   Laboratory Data:  High Sensitivity Troponin:   Recent Labs  Lab 05/04/23 0306 05/04/23 0526  TROPONINIHS 18* 13     Chemistry Recent Labs  Lab 05/04/23 0306  NA 132*  K 3.3*  CL 95*  CO2 24  GLUCOSE 139*  BUN 20  CREATININE 1.11*  CALCIUM 9.1  GFRNONAA 46*  ANIONGAP 13    Recent Labs  Lab 05/04/23 0306  PROT 6.8  ALBUMIN 3.7  AST 21  ALT 14  ALKPHOS 80  BILITOT 0.6   Lipids No results for input(s): "CHOL", "TRIG", "HDL", "LABVLDL", "LDLCALC", "CHOLHDL" in the last 168 hours.  Hematology Recent Labs  Lab 05/04/23 0306  WBC 16.7*  RBC 3.72*  HGB 11.6*  HCT 36.2  MCV 97.3  MCH 31.2  MCHC 32.0  RDW 14.4  PLT 227   Thyroid No results for input(s): "TSH", "FREET4" in the last 168 hours.  BNP Recent Labs  Lab 05/04/23 0306  BNP 348.6*    DDimer No results for input(s): "DDIMER" in the last 168 hours.   Radiology/Studies:  ECHOCARDIOGRAM COMPLETE  Result Date: 05/04/2023    ECHOCARDIOGRAM REPORT   Patient Name:   JOA MADDUX Date of Exam: 05/04/2023 Medical Rec #:  [id number]                Height:       59.0 in Accession #:    [id number]               Weight:       131.0 lb Date of Birth:  1928-04-15                BSA:          1.541 m Patient  Age:    89 years                 BP:           132/59 mmHg Patient Gender: F                        HR:           88 bpm. Exam Location:  Inpatient Procedure: 2D Echo, Cardiac Doppler and Color Doppler Indications:    I50.40* Unspecified combined systolic (congestive) and diastolic                 (congestive) heart failure  History:        Patient has prior history of Echocardiogram examinations, most                 recent 09/04/2022. Abnormal ECG, Pulmonary HTN and  Stroke,                 Arrythmias:Atrial Fibrillation; Risk Factors:Hypertension.  Sonographer:    Sheralyn Boatman RDCS Referring Phys: [id number] RONDELL A SMITH IMPRESSIONS  1. Left ventricular ejection fraction, by estimation, is 60 to 65%. The left ventricle has normal function. The left ventricle has no regional wall motion abnormalities. There is mild left ventricular hypertrophy. Left ventricular diastolic parameters are indeterminate.  2. Right ventricular systolic function is mildly reduced. The right ventricular size is moderately enlarged. There is moderately elevated pulmonary artery systolic pressure. The estimated right ventricular systolic pressure is 56.2 mmHg.  3. Left atrial size was moderately dilated.  4. Right atrial size was severely dilated.  5. The mitral valve is degenerative. Moderate mitral valve regurgitation.  6. Tricuspid valve regurgitation is moderate.  7. The aortic valve is calcified. There is moderate calcification of the aortic valve. There is mild thickening of the aortic valve. Aortic valve regurgitation is trivial. Aortic valve sclerosis/calcification is present, without any evidence of aortic stenosis.  8. The inferior vena cava is dilated in size with <50% respiratory variability, suggesting right atrial pressure of 15 mmHg. Conclusion(s)/Recommendation(s): LVEF normal. Mildly reduced RV function with moderately elevated PA pressures and elevated RA pressure. FINDINGS  Left Ventricle: Left ventricular ejection fraction,  by estimation, is 60 to 65%. The left ventricle has normal function. The left ventricle has no regional wall motion abnormalities. The left ventricular internal cavity size was small. There is mild left ventricular hypertrophy. Abnormal (paradoxical) septal motion, consistent with left bundle branch block. Left ventricular diastolic parameters are indeterminate. Right Ventricle: The right ventricular size is moderately enlarged. No increase in right ventricular wall thickness. Right ventricular systolic function is mildly reduced. There is moderately elevated pulmonary artery systolic pressure. The tricuspid regurgitant velocity is 3.21 m/s, and with an assumed right atrial pressure of 15 mmHg, the estimated right ventricular systolic pressure is 56.2 mmHg. Left Atrium: Left atrial size was moderately dilated. Right Atrium: Right atrial size was severely dilated. Pericardium: There is no evidence of pericardial effusion. Mitral Valve: The mitral valve is degenerative in appearance. Mild mitral annular calcification. Moderate mitral valve regurgitation. MV peak gradient, 9.5 mmHg. The mean mitral valve gradient is 4.0 mmHg. Tricuspid Valve: The tricuspid valve is normal in structure. Tricuspid valve regurgitation is moderate . No evidence of tricuspid stenosis. Aortic Valve: The aortic valve is calcified. There is moderate calcification of the aortic valve. There is mild thickening of the aortic valve. Aortic valve regurgitation is trivial. Aortic valve sclerosis/calcification is present, without any evidence of aortic stenosis. Aortic valve mean gradient measures 7.0 mmHg. Aortic valve peak gradient measures 12.8 mmHg. Aortic valve area, by VTI measures 2.48 cm. Pulmonic Valve: The pulmonic valve was not well visualized. Pulmonic valve regurgitation is not visualized. No evidence of pulmonic stenosis. Aorta: The aortic root, ascending aorta, aortic arch and descending aorta are all structurally normal, with no  evidence of dilitation or obstruction. Venous: The inferior vena cava is dilated in size with less than 50% respiratory variability, suggesting right atrial pressure of 15 mmHg. IAS/Shunts: No atrial level shunt detected by color flow Doppler. Additional Comments: There is pleural effusion in the left lateral region.  LEFT VENTRICLE PLAX 2D LVIDd:         4.00 cm LVIDs:         2.45 cm LV PW:         1.20 cm LV IVS:  1.10 cm LVOT diam:     2.20 cm LV SV:         89 LV SV Index:   58 LVOT Area:     3.80 cm  LV Volumes (MOD) LV vol d, MOD A2C: 73.0 ml LV vol d, MOD A4C: 64.6 ml LV vol s, MOD A2C: 23.6 ml LV vol s, MOD A4C: 21.2 ml LV SV MOD A2C:     49.4 ml LV SV MOD A4C:     64.6 ml LV SV MOD BP:      45.1 ml RIGHT VENTRICLE            IVC RV S prime:     9.14 cm/s  IVC diam: 2.40 cm TAPSE (M-mode): 1.1 cm LEFT ATRIUM             Index        RIGHT ATRIUM           Index LA diam:        4.90 cm 3.18 cm/m   RA Area:     24.40 cm LA Vol (A2C):   49.6 ml 32.20 ml/m  RA Volume:   72.40 ml  47.00 ml/m LA Vol (A4C):   60.7 ml 39.40 ml/m LA Biplane Vol: 58.4 ml 37.91 ml/m  AORTIC VALVE AV Area (Vmax):    2.85 cm AV Area (Vmean):   2.47 cm AV Area (VTI):     2.48 cm AV Vmax:           179.00 cm/s AV Vmean:          123.000 cm/s AV VTI:            0.360 m AV Peak Grad:      12.8 mmHg AV Mean Grad:      7.0 mmHg LVOT Vmax:         134.00 cm/s LVOT Vmean:        79.800 cm/s LVOT VTI:          0.235 m LVOT/AV VTI ratio: 0.65  AORTA Ao Root diam: 3.10 cm Ao Asc diam:  3.40 cm MITRAL VALVE                TRICUSPID VALVE MV Area (PHT): 5.27 cm     TR Peak grad:   41.2 mmHg MV Area VTI:   3.63 cm     TR Vmax:        321.00 cm/s MV Peak grad:  9.5 mmHg MV Mean grad:  4.0 mmHg     SHUNTS MV Vmax:       1.54 m/s     Systemic VTI:  0.24 m MV Vmean:      83.8 cm/s    Systemic Diam: 2.20 cm MV Decel Time: 144 msec MR Peak grad: 69.2 mmHg MR Mean grad: 47.0 mmHg MR Vmax:      416.00 cm/s MR Vmean:     327.0 cm/s MV E  velocity: 112.00 cm/s Jodelle Red MD Electronically signed by Jodelle Red MD Signature Date/Time: 05/04/2023/1:10:59 PM    Final    DG Chest Portable 1 View  Result Date: 05/04/2023 CLINICAL DATA:  Shortness of breath.  History of CHF EXAM: PORTABLE CHEST 1 VIEW COMPARISON:  Radiographs 03/28/2023 FINDINGS: Stable cardiomegaly. Aortic atherosclerotic calcification. Mitral annular calcification. Pulmonary venous congestion with interstitial coarsening greatest in the left lower lung. Left basilar atelectasis. No definite pleural effusion. No pneumothorax. No displaced rib fractures. IMPRESSION: Cardiomegaly and mild interstitial edema.  Electronically Signed   By: Minerva Fester M.D.   On: 05/04/2023 03:38     Assessment and Plan:   Acute on chronic HFpEF Moderate MR  Patient with multiple admissions now for heart failure exacerbations this year. Patient reporting worsening complaints of orthopnea and shortness of breath.  BNP 348.  Has diuresed about 1 L on IV Lasix 40 mg and significant improvement of symptoms. Still short of breath speaking in short burst with 1+ pitting edema, but improved. Suspect she has not been adequately diuresed from previous admission and needs more aggressive diuresis.  Increase IV Lasix 40 mg BID She weighs himself every day.  Baseline weight on the 125-130.  Currently 130. Echo pending.  If renal function/BP stable, may consider adding back spiro to further augment diuresis/GDMT at DC.  Permanent atrial fibrillation Currently rate controlled with heart rates between 70-100. Continue Eliquis 2.5 mg reduced dose for age and weight. Continue Lopressor 100 mg twice daily, diltiazem 60 mg 3 times daily  Nonobstructive CAD Hyperlipidemia Previously noted on catheterization in 2013.  Continue atorvastatin 10 mg daily.  No chest pain.  CKD stage IIIb Creatinine on admission 1.1. Some Cr is expected and okay if symptoms  improve.  Hypokalemia Continue to supplement as needed  Recurrent UTIs, leukocytosis, chronic indwelling Foley, hydronephrosis attributed to chronic uterine prolapse  Risk Assessment/Risk Scores:   New York Heart Association (NYHA) Functional Class NYHA Class III  CHA2DS2-VASc Score = 8  This indicates a 10.8% annual risk of stroke. The patient's score is based upon: CHF History: 1 HTN History: 1 Diabetes History: 0 Stroke History: 2 Vascular Disease History: 1 Age Score: 2 Gender Score: 1    For questions or updates, please contact [facility] Please consult [email or web address] for contact info under    Signed, Abagail Kitchens, PA-C  05/04/2023 3:15 PM   I have seen and examined the patient along with Abagail Kitchens, PA-C .  I have reviewed the chart, notes and new data.  I agree with PA/NP's note.  Key new complaints: feels better, but still mildly orthopneic and speaks in interrupted sentences. Key examination changes: JVD 8-9 cm, 1+ ankle edema, clear lungs, rate is well controlled Key new findings / data: normal LVEF with LBBB related dyssynchrony, severe biatrial dilation, echo shows dilated IVC, 2+MR, 3+ TR with moderate PAH (sPAP 55-60 versus 40 in Dec 2023)  PLAN: Suspect repeated admissions due to insufficient diuresis, as we are trying to avoid renal dysfunction. Her weight has been 125-130 lb. I think we should shoot for a post-diuresis weight of around 120 lb and will need to tolerate some mild azotemia to avoid readmission for HF exacerbation (allow creatinine 1.3-1.4). Some degree of suspicion for amyloidosis (relatively low voltage on ECG despite LBBB and marked biatrial dilation), but there are alternative explanations for this and aggressive treatment for amyloidosis at age 89 is less likely to be of benefit. OK to consider outpatient PYP scan.  Thurmon Fair, MD, [facility] [phone] 05/04/2023, 3:44 PM

## 2023-05-04 NOTE — ED Triage Notes (Signed)
Pt arrives to ED c/o SOB since 1900. Pt wears 2L at night with saturation of low 80's per EMS. Pt given 2 duo nebs and 125mg  of solumedrol with EMS. Hx of CHF, A-fib and hypertension

## 2023-05-04 NOTE — Progress Notes (Signed)
  Echocardiogram 2D Echocardiogram has been performed.  Adrienne [name] 05/04/2023, 11:14 AM

## 2023-05-05 ENCOUNTER — Ambulatory Visit: Payer: Medicare Other

## 2023-05-05 DIAGNOSIS — I5033 Acute on chronic diastolic (congestive) heart failure: Secondary | ICD-10-CM | POA: Diagnosis not present

## 2023-05-05 DIAGNOSIS — I679 Cerebrovascular disease, unspecified: Secondary | ICD-10-CM | POA: Insufficient documentation

## 2023-05-05 LAB — CBC
HCT: 31.6 % — ABNORMAL LOW (ref 36.0–46.0)
Hemoglobin: 10.5 g/dL — ABNORMAL LOW (ref 12.0–15.0)
MCH: 31.3 pg (ref 26.0–34.0)
MCHC: 33.2 g/dL (ref 30.0–36.0)
MCV: 94 fL (ref 80.0–100.0)
Platelets: 182 10*3/uL (ref 150–400)
RBC: 3.36 MIL/uL — ABNORMAL LOW (ref 3.87–5.11)
RDW: 14.5 % (ref 11.5–15.5)
WBC: 18.2 10*3/uL — ABNORMAL HIGH (ref 4.0–10.5)
nRBC: 0 % (ref 0.0–0.2)

## 2023-05-05 LAB — BASIC METABOLIC PANEL
Anion gap: 7 (ref 5–15)
BUN: 28 mg/dL — ABNORMAL HIGH (ref 8–23)
CO2: 29 mmol/L (ref 22–32)
Calcium: 8.6 mg/dL — ABNORMAL LOW (ref 8.9–10.3)
Chloride: 99 mmol/L (ref 98–111)
Creatinine, Ser: 1.33 mg/dL — ABNORMAL HIGH (ref 0.44–1.00)
GFR, Estimated: 37 mL/min — ABNORMAL LOW (ref >60)
Glucose, Bld: 119 mg/dL — ABNORMAL HIGH (ref 70–99)
Potassium: 4.3 mmol/L (ref 3.5–5.1)
Sodium: 135 mmol/L (ref 135–145)

## 2023-05-05 NOTE — TOC Initial Note (Signed)
Transition of Care [facility]) - Initial/Assessment Note    Patient Details  Name: Adrienne [name] MRN: [id number] Date of Birth: 08/08/1928  Transition of Care [facility]) CM/SW Contact:    Leone Haven, RN Phone Number: 05/05/2023, 5:21 PM  Clinical Narrative:                 From home with spouse, has PCP and insurance on file, states has  an aide with [facility] care on MWF  from 12 to 4, she has a walker and home oxygen 2 liters at night with [employer]. States family member will transport them home at [employer] and family is support system, states gets medications from [facility] in [place]  and [facility].  Pta ambulatory with walker.   Expected Discharge Plan: Home w Home Health Services Barriers to Discharge: Continued Medical Work up   Patient Goals and CMS Choice Patient states their goals for this hospitalization and ongoing recovery are:: return home   Choice offered to / list presented to : NA      Expected Discharge Plan and Services In-house Referral: NA Discharge Planning Services: CM Consult Post Acute Care Choice: NA Living arrangements for the past 2 months: Single Family Home                 DME Arranged: N/A         HH Arranged: NA          Prior Living Arrangements/Services Living arrangements for the past 2 months: Single Family Home Lives with:: Spouse Patient language and need for interpreter reviewed:: Yes Do you feel safe going back to the place where you live?: Yes      Need for Family Participation in Patient Care: Yes (Comment) Care giver support system in place?: Yes (comment) Current home services: DME (walker, home oxygen 2 liters at night with [employer]) Criminal Activity/Legal Involvement Pertinent to Current Situation/Hospitalization: No - Comment as needed  Activities of Daily Living Home Assistive Devices/Equipment: Other (Comment) (walker) ADL Screening (condition at time of admission) Patient's cognitive ability adequate to  safely complete daily activities?: Yes Is the patient deaf or have difficulty hearing?: Yes Does the patient have difficulty seeing, even when wearing glasses/contacts?: Yes (wears glasses) Does the patient have difficulty concentrating, remembering, or making decisions?: No Patient able to express need for assistance with ADLs?: Yes Does the patient have difficulty dressing or bathing?: No Independently performs ADLs?: Yes (appropriate for developmental age) Does the patient have difficulty walking or climbing stairs?: Yes Weakness of Legs: Both Weakness of Arms/Hands: None  Permission Sought/Granted Permission sought to share information with : Case Manager Permission granted to share information with : Yes, Verbal Permission Granted              Emotional Assessment Appearance:: Appears stated age Attitude/Demeanor/Rapport: Engaged Affect (typically observed): Appropriate Orientation: : Oriented to Self, Oriented to Place, Oriented to  Time, Oriented to Situation Alcohol / Substance Use: Not Applicable Psych Involvement: No (comment)  Admission diagnosis:  (HFpEF) heart failure with preserved ejection fraction (HCC) [I50.30] Acute on chronic congestive heart failure, unspecified heart failure type [facility]) [I50.9] Patient Active Problem List   Diagnosis Date Noted   Cerebrovascular disease 05/05/2023   (HFpEF) heart failure with preserved ejection fraction (HCC) 05/04/2023   Hypokalemia 05/04/2023   Normocytic anemia 05/04/2023   Mitral regurgitation 04/20/2023   Hydronephrosis of left kidney 03/24/2023   Acute on chronic diastolic CHF (congestive heart failure) (HCC) 03/23/2023   CKD (chronic  kidney disease), stage IIIb (HCC) 03/23/2023   UTI (urinary tract infection) due to urinary indwelling catheter (HCC) 02/04/2023   Hyponatremia 12/16/2022   AKI (acute kidney injury) (HCC) 12/16/2022   Acute cystitis with hydronephrosis due to indwelling foley catheter 12/15/2022    Acute respiratory failure with hypoxia (HCC) 09/03/2022   CAP (community acquired pneumonia) 09/03/2022   Catheter-associated urinary tract infection (HCC) 09/03/2022   Open knee wound 10/05/2021   Heart failure with preserved ejection fraction (HCC) 08/18/2021   Palliative care by specialist    Goals of care, counseling/discussion    DNR (do not resuscitate) discussion    Vaginal bleeding 08/08/2020   Uterine prolapse 08/08/2020   Vaginal erosion secondary to pessary use (HCC) 08/08/2020   Acute urinary retention 08/08/2020   Chronic respiratory failure (HCC) 08/08/2020   Multiple pulmonary nodules 05/24/2020   Cough 05/24/2020   Malignant neoplasm of upper-inner quadrant of left breast in female, estrogen receptor negative (HCC) 12/20/2019   Malignant neoplasm of overlapping sites of right breast in female, estrogen receptor positive (HCC) 08/14/2019   Osteopenia 08/14/2019   Diplopia 01/29/2016   GERD (gastroesophageal reflux disease) 01/29/2016   Cerebrovascular accident (CVA) due to embolism of left middle cerebral artery (HCC) 01/29/2016   Bleeding gastrointestinal    Gait disturbance, post-stroke 01/27/2016   Fall    Secondary hypertension, unspecified    Cerebral infarction due to thrombosis of left middle cerebral artery (HCC) s/p mechanical thrombectomy    Malnutrition of moderate degree 01/09/2016   GI bleed 01/06/2016   Permanent atrial fibrillation (HCC) 06/10/2015   Chronic anticoagulation 06/10/2015   Hyperlipidemia 06/10/2015   Essential hypertension 06/10/2015   History of stroke 06/10/2015   Coronary artery disease due to lipid rich plaque 06/10/2015   Elevated uric acid in blood    Arthritis of ankle    Pulmonary HTN (HCC)    PCP:  Sigmund Hazel, MD Pharmacy:   [facility] [phone] - [place], [place] - [address] AT [facility] [address] [place] [address] Phone: [phone] Fax: [phone]  MEDCENTER  [facility] [address] [place] [address] Phone: [phone] Fax: [phone]  [facility] [address] [place] [address] Phone: [phone] Fax: [phone]     Social Determinants of Health (SDOH) Social History: SDOH Screenings   Food Insecurity: No Food Insecurity (05/04/2023)  Housing: Patient Declined (05/04/2023)  Transportation Needs: No Transportation Needs (05/04/2023)  Utilities: Not At Risk (05/04/2023)  Tobacco Use: Medium Risk (05/04/2023)   SDOH Interventions:     Readmission Risk Interventions    05/05/2023    5:19 PM 02/06/2023    3:10 PM 12/18/2022   11:03 AM  Readmission Risk Prevention Plan  Transportation Screening Complete Complete Complete  PCP or Specialist Appt within 3-5 Days   Complete  HRI or Home Care Consult   Complete  Social Work Consult for Recovery Care Planning/Counseling   Complete  Palliative Care Screening   Not Applicable  Medication Review Oceanographer) Complete Complete Complete  PCP or Specialist appointment within 3-5 days of discharge  Complete   HRI or Home Care Consult Complete Complete   SW Recovery Care/Counseling Consult  Complete   Palliative Care Screening Not Applicable Not Applicable   Skilled Nursing Facility Not Applicable Not Applicable

## 2023-05-05 NOTE — Consult Note (Signed)
   [facility]  [TH[facility]N [facility] Inpatient Consult   05/05/2023  Makaiah Vanauken Jul 10, 1928 [id number]  [facility] [[facility]]  Accountable Care Organization [ACO] Patient: Medicare ACO REACH insurance  Primary Care Provider:  Sigmund Hazel, MD with Deboraha Sprang at [facility] is listed to provide the transition of care follow up calls and appointments  Patient screened for 4 hospitalizations in the past 6 months in [facility] with noted extreme risk score for unplanned readmission risk and to assess for post hospital transition for care coordination.  Review of patient's electronic medical record reveals patient is admitted with acute on chronic HF. Documented SDOH was reviewed and intact, no needs assessed. Met with patient at the bedside, eating lunch. Explained reason for rounding visit.  Patient was accepting and generous.  Endorses follow up with [facility] providers and team.  Patient states the alarms were bothersome and request to be adjusted, notified RN of request.  Plan:  Continue to follow progress and disposition to assess for post hospital community care coordination/management needs.  Referral request for community care coordination: anticipate updates with the [facility] team for follow up  Of note, [facility] Care Management/Population Health does not replace or interfere with any arrangements made by the Inpatient Transition of Care team.  For questions contact:   Charlesetta Shanks, RN BSN CCM [facility]  [phone] business mobile phone Toll free office [phone]  Fax number: [phone] [email or web address]

## 2023-05-05 NOTE — Assessment & Plan Note (Signed)
Continue blood pressure control with metoprolol and diltiazem.

## 2023-05-05 NOTE — Assessment & Plan Note (Signed)
Hgb stable, no further hematuria.

## 2023-05-05 NOTE — Assessment & Plan Note (Signed)
Mild, asymptomatic - Continue diuretics per Cardiology

## 2023-05-05 NOTE — Assessment & Plan Note (Signed)
Hypokalemia, and hyponatremia.   Patient tolerated well diuresis with IV furosemide. At the time of her discharge her renal function has a serum cr of 1.38 with K at 3,4 and serum bicarbonate at 27.   Plan to continue furosemide 40 mg po daily.  Follow up renal function as outpatient.

## 2023-05-05 NOTE — Progress Notes (Signed)
  Progress Note   Patient: Adrienne [name] DGL:[id number] DOB: [birth year] DOA: 05/04/2023     1 DOS: the patient was seen and examined on 05/05/2023 at 11:28AM      Brief hospital course: Mrs. Grzeskowiak is a 89 y.o. F with severe bladder prolapse and chronic indwelling foley, cAF on Eliquis, dCHF, pHTN, CAD, CKD IIIb baseline 1.1-1.4, and hx CVA who presented with pain at her foley, gross hematuria, orthopnea and increasing dyspnea on exertion.  In the ER, WBC 16K, tachypneic and CXR with edema.      Assessment and Plan: * Acute on chronic diastolic CHF (congestive heart failure) (HCC) - Lasix per Cardiology, appreciate cares - Continue metoprolol    Catheter-associated urinary tract infection (HCC) - Continue Rocephin - Follow urine culture  Hypokalemia - Supplement K  Hyponatremia Mild, asymptomatic - Continue diuretics per Cardiology  Permanent atrial fibrillation (HCC) - Continue Eliquis - Continue metoprolol and diltiazem  Essential hypertension BP slightly elevated - Continue metoprolol, diltiazem, diuretics  Normocytic anemia Hgb stable, no further hematuria.  CKD (chronic kidney disease), stage IIIb (HCC) Cr trending up slightly          Subjective: Feeling somewhat better at rest but still above dry weight and still dyspneic with exertion.  No further urinary symptoms. No respiratory distress.     Physical Exam: BP (!) 154/72 (BP Location: Left Arm)   Pulse 74   Temp (!) 97.4 F (36.3 C) (Oral)   Resp 17   Ht 4\' 11"  (1.499 m)   Wt 57.7 kg   LMP 09/12/1973 (Approximate)   SpO2 96%   BMI 25.69 kg/m   Thin adult female, lying in recliner, no acute distress RRR, soft systolic murmur, no LE edema RR normal, no rales or wheezes Abdomen soft without tenderness to palpation Attention normal, affect normal, judgment and insight normal   Data Reviewed: BMP and CBC reviewed  Family Communication: Daughter and husband at  bedside    Disposition: Status is: Inpatient Patient admitted with CHF flare, mild, in setting of UTI  WIll continue diruetics per Cardiology and monitor urine culture  Plan for d/c when culture speciates and we can transition to orals and when cardiology finish diuresis        Author: Alberteen Sam, MD 05/05/2023 6:12 PM  For on call review [email or web address].

## 2023-05-05 NOTE — Assessment & Plan Note (Signed)
-   Continue Rocephin - Follow urine culture 

## 2023-05-05 NOTE — Progress Notes (Signed)
Heart Failure Navigator Progress Note  Assessed for Heart & Vascular TOC clinic readiness.  Patient EF 60-65%,Will follow up with [facility]. .   Navigator available for reassessment of patient.   Rhae Hammock, BSN, Scientist, clinical (histocompatibility and immunogenetics) Only

## 2023-05-05 NOTE — Assessment & Plan Note (Signed)
Patient remained rate controlled, plan to continue AV blockade with metoprolol and diltiazem. Anticoagulation with apixaban.

## 2023-05-05 NOTE — Progress Notes (Signed)
Mobility Specialist Progress Note:    05/05/23 1033  Mobility  Activity Ambulated with assistance in hallway  Level of Assistance Contact guard assist, steadying assist  Assistive Device Front wheel walker  Distance Ambulated (ft) 400 ft  Activity Response Tolerated well  Mobility Referral Yes  $Mobility charge 1 Mobility  Mobility Specialist Start Time (ACUTE ONLY) 0941  Mobility Specialist Stop Time (ACUTE ONLY) 0955  Mobility Specialist Time Calculation (min) (ACUTE ONLY) 14 min   Pt received in bed, agreeable to ambulate. Needed no physical assistance throughout session. C/o mild SOB, otherwise no c/o. Pt assisted back to bed. Left sitting w/ MD in room. All needs met and call bell in hand.  Thompson Grayer Mobility Specialist  Please contact vis Secure Chat or  Rehab Office [phone] \

## 2023-05-05 NOTE — Assessment & Plan Note (Signed)
Resolved with supplementation and starting spironolactone. 

## 2023-05-05 NOTE — Hospital Course (Signed)
Adrienne [name] was admitted to the hospital with the working diagnosis of heart failure exacerbation.   89 y.o. F with severe bladder prolapse and chronic indwelling foley, atrial fibrillation, dCHF, pHTN, CAD, CKD IIIb and hx CVA who presented with pain at her foley, gross hematuria, orthopnea and increasing dyspnea on exertion. About 13 days prior to admission her diuretic regimen was increased due to volume overload. She was diagnosed with urinary tract infection as outpatient and treated with nitrofurantoin. 24 hrs prior to admission she noted orthopnea and PND, prompting her to come to the hospital. On her initial physical examination her RR was 22-30, blood pressure 119/57, HR 82, RR 27 and 02 saturation 92%, positive JVD, coarse breath sounds and rales bilaterally, heart with S1 and S2 present irregularly irregular, abdomen with no distention and positive lower extremity edema.   Na 136, K 3.3, CL 96, bicarbonate 24, glucose 139, bun 20, cr 1,11  BNP 348 High sensitive troponin 18 and 13  Wbc 16,7 hgb 11,6 plt 227  Urine analysis SG 1,008, large leukocytes, glucose 50, protein negative. > 50 wbc  Chest radiograph with cardiomegaly, bilateral hilar vascular congestion and cephalization of the vasculature.   EKG 90 bpm, right axis deviation, interventricular conduction delay with prolonged qrs, atrial fibrillation rhythm with no significant ST segment or T wave changes.

## 2023-05-05 NOTE — Plan of Care (Signed)
°  Problem: Education: Goal: Ability to demonstrate management of disease process will improve Outcome: Progressing Goal: Ability to verbalize understanding of medication therapies will improve Outcome: Progressing Goal: Individualized Educational Video(s) Outcome: Progressing   Problem: Activity: Goal: Capacity to carry out activities will improve Outcome: Progressing   Problem: Cardiac: Goal: Ability to achieve and maintain adequate cardiopulmonary perfusion will improve Outcome: Progressing   Problem: Education: Goal: Knowledge of General Education information will improve Description: Including pain rating scale, medication(s)/side effects and non-pharmacologic comfort measures Outcome: Progressing   Problem: Health Behavior/Discharge Planning: Goal: Ability to manage health-related needs will improve Outcome: Progressing   Problem: Clinical Measurements: Goal: Ability to maintain clinical measurements within normal limits will improve Outcome: Progressing Goal: Will remain free from infection Outcome: Progressing Goal: Diagnostic test results will improve Outcome: Progressing Goal: Respiratory complications will improve Outcome: Progressing Goal: Cardiovascular complication will be avoided Outcome: Progressing   Problem: Activity: Goal: Risk for activity intolerance will decrease Outcome: Progressing   Problem: Nutrition: Goal: Adequate nutrition will be maintained Outcome: Progressing   Problem: Coping: Goal: Level of anxiety will decrease Outcome: Progressing   Problem: Elimination: Goal: Will not experience complications related to bowel motility Outcome: Progressing Goal: Will not experience complications related to urinary retention Outcome: Progressing   Problem: Pain Managment: Goal: General experience of comfort will improve Outcome: Progressing   Problem: Safety: Goal: Ability to remain free from injury will improve Outcome: Progressing    Problem: Skin Integrity: Goal: Risk for impaired skin integrity will decrease Outcome: Progressing   Problem: Education: Goal: Ability to demonstrate management of disease process will improve Outcome: Progressing Goal: Ability to verbalize understanding of medication therapies will improve Outcome: Progressing Goal: Individualized Educational Video(s) Outcome: Progressing   Problem: Activity: Goal: Capacity to carry out activities will improve Outcome: Progressing   Problem: Cardiac: Goal: Ability to achieve and maintain adequate cardiopulmonary perfusion will improve Outcome: Progressing

## 2023-05-05 NOTE — Progress Notes (Signed)
Rounding Note    Patient Name: Adrienne [name] Date of Encounter: 05/05/2023   [facility] Cardiologist: Donato Schultz, MD   Subjective   Feeling better.  Walked 2 laps in the hallway, was dyspneic when she came back.Still has some mild ankle edema.  Weight 127 pounds. Creatinine has included slightly to 1.3.  Inpatient Medications    Scheduled Meds:  apixaban  2.5 mg Oral BID   Chlorhexidine Gluconate Cloth  6 each Topical Daily   diltiazem  60 mg Oral TID   furosemide  40 mg Intravenous BID   metoprolol tartrate  100 mg Oral BID   sodium chloride flush  3 mL Intravenous Q12H   Continuous Infusions:  cefTRIAXone (ROCEPHIN)  IV 1 g (05/05/23 0856)   PRN Meds: acetaminophen **OR** acetaminophen, albuterol   Vital Signs    Vitals:   05/04/23 2029 05/05/23 0118 05/05/23 0431 05/05/23 0826  BP: (!) 131/57  (!) 143/52 (!) 140/72  Pulse: 76   67  Resp: 18 15 19 18   Temp: 98 F (36.7 C) 98.1 F (36.7 C) 98 F (36.7 C) (!) 97.4 F (36.3 C)  TempSrc: Oral Oral Oral Oral  SpO2: 97%   95%  Weight:   57.7 kg   Height:        Intake/Output Summary (Last 24 hours) at 05/05/2023 1026 Last data filed at 05/05/2023 0434 Gross per 24 hour  Intake 1300 ml  Output 1750 ml  Net -450 ml      05/05/2023    4:31 AM 05/04/2023    2:56 AM 04/21/2023   10:19 AM  Last 3 Weights  Weight (lbs) 127 lb 3.3 oz 130 lb 15.3 oz 131 lb  Weight (kg) 57.7 kg 59.4 kg 59.421 kg      Telemetry    Atrial fibrillation, rate controlled- Personally Reviewed  ECG    No new tracing- Personally Reviewed  Physical Exam  A little dyspneic after her walk GEN: No acute distress.   Neck: Centimeter JVD Cardiac: Irregular, no murmurs, rubs, or gallops.  Respiratory: Clear to auscultation bilaterally. GI: Soft, nontender, non-distended  MS: 1+ symmetrical ankle edema developing wrinkles; No deformity. Neuro:  Nonfocal  Psych: Normal affect   Labs    High Sensitivity  Troponin:   Recent Labs  Lab 05/04/23 0306 05/04/23 0526  TROPONINIHS 18* 13     Chemistry Recent Labs  Lab 05/04/23 0306 05/05/23 0310  NA 132* 135  K 3.3* 4.3  CL 95* 99  CO2 24 29  GLUCOSE 139* 119*  BUN 20 28*  CREATININE 1.11* 1.33*  CALCIUM 9.1 8.6*  PROT 6.8  --   ALBUMIN 3.7  --   AST 21  --   ALT 14  --   ALKPHOS 80  --   BILITOT 0.6  --   GFRNONAA 46* 37*  ANIONGAP 13 7    Lipids No results for input(s): "CHOL", "TRIG", "HDL", "LABVLDL", "LDLCALC", "CHOLHDL" in the last 168 hours.  Hematology Recent Labs  Lab 05/04/23 0306 05/05/23 0310  WBC 16.7* 18.2*  RBC 3.72* 3.36*  HGB 11.6* 10.5*  HCT 36.2 31.6*  MCV 97.3 94.0  MCH 31.2 31.3  MCHC 32.0 33.2  RDW 14.4 14.5  PLT 227 182   Thyroid No results for input(s): "TSH", "FREET4" in the last 168 hours.  BNP Recent Labs  Lab 05/04/23 0306  BNP 348.6*    DDimer No results for input(s): "DDIMER" in the last 168 hours.  Radiology    ECHOCARDIOGRAM COMPLETE  Result Date: 05/04/2023    ECHOCARDIOGRAM REPORT   Patient Name:   Adrienne [name] [facility] Date of Exam: 05/04/2023 Medical Rec #:  [id number]                Height:       59.0 in Accession #:    [id number]               Weight:       131.0 lb Date of Birth:  28-Oct-1927                BSA:          1.541 m Patient Age:    89 years                 BP:           132/59 mmHg Patient Gender: F                        HR:           88 bpm. Exam Location:  Inpatient Procedure: 2D Echo, Cardiac Doppler and Color Doppler Indications:    I50.40* Unspecified combined systolic (congestive) and diastolic                 (congestive) heart failure  History:        Patient has prior history of Echocardiogram examinations, most                 recent 09/04/2022. Abnormal ECG, Pulmonary HTN and Stroke,                 Arrythmias:Atrial Fibrillation; Risk Factors:Hypertension.  Sonographer:    Sheralyn Boatman RDCS Referring Phys: [id number] RONDELL A SMITH IMPRESSIONS  1. Left  ventricular ejection fraction, by estimation, is 60 to 65%. The left ventricle has normal function. The left ventricle has no regional wall motion abnormalities. There is mild left ventricular hypertrophy. Left ventricular diastolic parameters are indeterminate.  2. Right ventricular systolic function is mildly reduced. The right ventricular size is moderately enlarged. There is moderately elevated pulmonary artery systolic pressure. The estimated right ventricular systolic pressure is 56.2 mmHg.  3. Left atrial size was moderately dilated.  4. Right atrial size was severely dilated.  5. The mitral valve is degenerative. Moderate mitral valve regurgitation.  6. Tricuspid valve regurgitation is moderate.  7. The aortic valve is calcified. There is moderate calcification of the aortic valve. There is mild thickening of the aortic valve. Aortic valve regurgitation is trivial. Aortic valve sclerosis/calcification is present, without any evidence of aortic stenosis.  8. The inferior vena cava is dilated in size with <50% respiratory variability, suggesting right atrial pressure of 15 mmHg. Conclusion(s)/Recommendation(s): LVEF normal. Mildly reduced RV function with moderately elevated PA pressures and elevated RA pressure. FINDINGS  Left Ventricle: Left ventricular ejection fraction, by estimation, is 60 to 65%. The left ventricle has normal function. The left ventricle has no regional wall motion abnormalities. The left ventricular internal cavity size was small. There is mild left ventricular hypertrophy. Abnormal (paradoxical) septal motion, consistent with left bundle branch block. Left ventricular diastolic parameters are indeterminate. Right Ventricle: The right ventricular size is moderately enlarged. No increase in right ventricular wall thickness. Right ventricular systolic function is mildly reduced. There is moderately elevated pulmonary artery systolic pressure. The tricuspid regurgitant velocity is 3.21  m/s, and with an assumed right atrial pressure of  15 mmHg, the estimated right ventricular systolic pressure is 56.2 mmHg. Left Atrium: Left atrial size was moderately dilated. Right Atrium: Right atrial size was severely dilated. Pericardium: There is no evidence of pericardial effusion. Mitral Valve: The mitral valve is degenerative in appearance. Mild mitral annular calcification. Moderate mitral valve regurgitation. MV peak gradient, 9.5 mmHg. The mean mitral valve gradient is 4.0 mmHg. Tricuspid Valve: The tricuspid valve is normal in structure. Tricuspid valve regurgitation is moderate . No evidence of tricuspid stenosis. Aortic Valve: The aortic valve is calcified. There is moderate calcification of the aortic valve. There is mild thickening of the aortic valve. Aortic valve regurgitation is trivial. Aortic valve sclerosis/calcification is present, without any evidence of aortic stenosis. Aortic valve mean gradient measures 7.0 mmHg. Aortic valve peak gradient measures 12.8 mmHg. Aortic valve area, by VTI measures 2.48 cm. Pulmonic Valve: The pulmonic valve was not well visualized. Pulmonic valve regurgitation is not visualized. No evidence of pulmonic stenosis. Aorta: The aortic root, ascending aorta, aortic arch and descending aorta are all structurally normal, with no evidence of dilitation or obstruction. Venous: The inferior vena cava is dilated in size with less than 50% respiratory variability, suggesting right atrial pressure of 15 mmHg. IAS/Shunts: No atrial level shunt detected by color flow Doppler. Additional Comments: There is pleural effusion in the left lateral region.  LEFT VENTRICLE PLAX 2D LVIDd:         4.00 cm LVIDs:         2.45 cm LV PW:         1.20 cm LV IVS:        1.10 cm LVOT diam:     2.20 cm LV SV:         89 LV SV Index:   58 LVOT Area:     3.80 cm  LV Volumes (MOD) LV vol d, MOD A2C: 73.0 ml LV vol d, MOD A4C: 64.6 ml LV vol s, MOD A2C: 23.6 ml LV vol s, MOD A4C: 21.2 ml LV SV  MOD A2C:     49.4 ml LV SV MOD A4C:     64.6 ml LV SV MOD BP:      45.1 ml RIGHT VENTRICLE            IVC RV S prime:     9.14 cm/s  IVC diam: 2.40 cm TAPSE (M-mode): 1.1 cm LEFT ATRIUM             Index        RIGHT ATRIUM           Index LA diam:        4.90 cm 3.18 cm/m   RA Area:     24.40 cm LA Vol (A2C):   49.6 ml 32.20 ml/m  RA Volume:   72.40 ml  47.00 ml/m LA Vol (A4C):   60.7 ml 39.40 ml/m LA Biplane Vol: 58.4 ml 37.91 ml/m  AORTIC VALVE AV Area (Vmax):    2.85 cm AV Area (Vmean):   2.47 cm AV Area (VTI):     2.48 cm AV Vmax:           179.00 cm/s AV Vmean:          123.000 cm/s AV VTI:            0.360 m AV Peak Grad:      12.8 mmHg AV Mean Grad:      7.0 mmHg LVOT Vmax:  134.00 cm/s LVOT Vmean:        79.800 cm/s LVOT VTI:          0.235 m LVOT/AV VTI ratio: 0.65  AORTA Ao Root diam: 3.10 cm Ao Asc diam:  3.40 cm MITRAL VALVE                TRICUSPID VALVE MV Area (PHT): 5.27 cm     TR Peak grad:   41.2 mmHg MV Area VTI:   3.63 cm     TR Vmax:        321.00 cm/s MV Peak grad:  9.5 mmHg MV Mean grad:  4.0 mmHg     SHUNTS MV Vmax:       1.54 m/s     Systemic VTI:  0.24 m MV Vmean:      83.8 cm/s    Systemic Diam: 2.20 cm MV Decel Time: 144 msec MR Peak grad: 69.2 mmHg MR Mean grad: 47.0 mmHg MR Vmax:      416.00 cm/s MR Vmean:     327.0 cm/s MV E velocity: 112.00 cm/s Jodelle Red MD Electronically signed by Jodelle Red MD Signature Date/Time: 05/04/2023/1:10:59 PM    Final    DG Chest Portable 1 View  Result Date: 05/04/2023 CLINICAL DATA:  Shortness of breath.  History of CHF EXAM: PORTABLE CHEST 1 VIEW COMPARISON:  Radiographs 03/28/2023 FINDINGS: Stable cardiomegaly. Aortic atherosclerotic calcification. Mitral annular calcification. Pulmonary venous congestion with interstitial coarsening greatest in the left lower lung. Left basilar atelectasis. No definite pleural effusion. No pneumothorax. No displaced rib fractures. IMPRESSION: Cardiomegaly and mild  interstitial edema. Electronically Signed   By: Minerva Fester M.D.   On: 05/04/2023 03:38    Cardiac Studies    1. Left ventricular ejection fraction, by estimation, is 60 to 65%. The left ventricle has normal function. The left ventricle has no regional wall motion abnormalities. There is mild left ventricular hypertrophy. Left ventricular diastolic parameters are indeterminate.   2. Right ventricular systolic function is mildly reduced. The right ventricular size is moderately enlarged. There is moderately elevated pulmonary artery systolic pressure. The estimated right ventricular systolic pressure is 56.2 mmHg.   3. Left atrial size was moderately dilated.   4. Right atrial size was severely dilated.   5. The mitral valve is degenerative. Moderate mitral valve regurgitation.   6. Tricuspid valve regurgitation is moderate.   7. The aortic valve is calcified. There is moderate calcification of the aortic valve. There is mild thickening of the aortic valve. Aortic valve regurgitation is trivial. Aortic valve sclerosis/calcification is present, without any evidence of aortic stenosis.   8. The inferior vena cava is dilated in size with <50% respiratory variability, suggesting right atrial pressure of 15 mmHg.   Conclusion(s)/Recommendation(s): LVEF normal. Mildly reduced RV function with moderately elevated PA pressures and elevated RA pressure  Patient Profile     89 y.o. female with permanent atrial fibrillation, chronic diastolic heart failure, pulmonary artery hypertension, left bundle branch block, moderate mitral insufficiency, CKD stage IIIb, recurrent UTI in setting of hydronephrosis and uterine prolapse, history of stroke during interruption of anticoagulation, admitted with acute on chronic heart failure exacerbation (in a series of repeated admissions over the last several months for the same reason).  Assessment & Plan    Feeling better, but still with mild dyspnea on exertion and  mild ankle edema. Weight is 127 pounds, suspect "dry weight" is 120-125 pounds based on history. Slight increase in creatinine, but hyponatremia  has actually resolved with diuresis.    Need to tolerate some degree of increase in renal parameters to avoid rehospitalization for heart failure. Will continue with IV diuretics today, then need to decide on the appropriate home dose (the previous regimen of 40 mg 3 days a week and 20 mg other days of the week was insufficient).  Some degree of suspicion for amyloidosis (relatively low voltage on ECG despite LBBB and marked biatrial dilation), but there are alternative explanations for this and aggressive treatment for amyloidosis at age 39 is less likely to be of benefit. OK to consider outpatient PYP scan.      For questions or updates, please contact [facility] Please consult [email or web address] for contact info under        Signed, Thurmon Fair, MD  05/05/2023, 10:26 AM

## 2023-05-05 NOTE — Assessment & Plan Note (Signed)
Echocardiogram with preserved LV systolic function EF 60 to 65%, mild LVH, RV systolic function with mild reduction, RVSP 56,2 mmHg. LA with moderate dilatation, RA with severe dilatation, moderate MR, moderate TR.   Pulmonary hypertension Acute on chronic core pulmonale.   Patient was placed on IV furosemide for diuresis, negative fluid balance was achieved, - 4,850 ml, with significant improvement in her symptoms.   At the time of her discharge she will continue taking furosemide 40 mg po daily and follow up as outpatient.  Continue with metoprolol. Holding RAAS inhibition until renal function more stable.  Holding on SGLT 2 inh due to risk of urinary tract infection (chronic indwelling foley catheter with recurrent urinary tract infections). Follow up with cardiology as outpatient.

## 2023-05-06 ENCOUNTER — Other Ambulatory Visit (HOSPITAL_COMMUNITY): Payer: Self-pay

## 2023-05-06 DIAGNOSIS — T83511A Infection and inflammatory reaction due to indwelling urethral catheter, initial encounter: Secondary | ICD-10-CM | POA: Diagnosis not present

## 2023-05-06 DIAGNOSIS — I4821 Permanent atrial fibrillation: Secondary | ICD-10-CM | POA: Diagnosis not present

## 2023-05-06 DIAGNOSIS — E440 Moderate protein-calorie malnutrition: Secondary | ICD-10-CM

## 2023-05-06 DIAGNOSIS — N1832 Chronic kidney disease, stage 3b: Secondary | ICD-10-CM | POA: Diagnosis not present

## 2023-05-06 DIAGNOSIS — I5033 Acute on chronic diastolic (congestive) heart failure: Secondary | ICD-10-CM | POA: Diagnosis not present

## 2023-05-06 LAB — CBC
HCT: 33.7 % — ABNORMAL LOW (ref 36.0–46.0)
Hemoglobin: 11 g/dL — ABNORMAL LOW (ref 12.0–15.0)
MCH: 31.7 pg (ref 26.0–34.0)
MCHC: 32.6 g/dL (ref 30.0–36.0)
MCV: 97.1 fL (ref 80.0–100.0)
Platelets: 202 10*3/uL (ref 150–400)
RBC: 3.47 MIL/uL — ABNORMAL LOW (ref 3.87–5.11)
RDW: 14.7 % (ref 11.5–15.5)
WBC: 15 10*3/uL — ABNORMAL HIGH (ref 4.0–10.5)
nRBC: 0 % (ref 0.0–0.2)

## 2023-05-06 LAB — BASIC METABOLIC PANEL
Anion gap: 13 (ref 5–15)
BUN: 35 mg/dL — ABNORMAL HIGH (ref 8–23)
CO2: 27 mmol/L (ref 22–32)
Calcium: 8.7 mg/dL — ABNORMAL LOW (ref 8.9–10.3)
Chloride: 96 mmol/L — ABNORMAL LOW (ref 98–111)
Creatinine, Ser: 1.38 mg/dL — ABNORMAL HIGH (ref 0.44–1.00)
GFR, Estimated: 36 mL/min — ABNORMAL LOW (ref >60)
Glucose, Bld: 87 mg/dL (ref 70–99)
Potassium: 3.4 mmol/L — ABNORMAL LOW (ref 3.5–5.1)
Sodium: 136 mmol/L (ref 135–145)

## 2023-05-06 LAB — URINE CULTURE: Culture: 10000 — AB

## 2023-05-06 MED ORDER — CIPROFLOXACIN HCL 500 MG PO TABS
500.0000 mg | ORAL_TABLET | Freq: Every day | ORAL | Status: DC
  Filled 2023-05-06: qty 1

## 2023-05-06 MED ORDER — FUROSEMIDE 20 MG PO TABS: 40.0000 mg | ORAL_TABLET | Freq: Every day | ORAL | Status: DC

## 2023-05-06 MED ORDER — CIPROFLOXACIN HCL 250 MG PO TABS: 250.0000 mg | ORAL_TABLET | Freq: Two times a day (BID) | ORAL | Status: DC

## 2023-05-06 MED ORDER — LEVOFLOXACIN 250 MG PO TABS
250.0000 mg | ORAL_TABLET | Freq: Every day | ORAL | 0 refills | Status: AC
  Filled 2023-05-06: qty 2, 2d supply, fill #0

## 2023-05-06 MED ORDER — CIPROFLOXACIN HCL 500 MG PO TABS
500.0000 mg | ORAL_TABLET | Freq: Every day | ORAL | 0 refills | Status: DC
  Filled 2023-05-06: qty 2, 2d supply, fill #0

## 2023-05-06 NOTE — Assessment & Plan Note (Signed)
Continue statin therapy.

## 2023-05-06 NOTE — Progress Notes (Signed)
Discharge instructions reviewed with pt, her husband and their daughter.  Antibiotic prescribed pt has listed as an allergy, secure message sent to Dr Ella Jubilee, medication changed to levofloxicin. [facility] filling script and will be picked up on the way out to take pt for discharge.  Copy of instructions given to pt/husband/daughter.   Pt d/c'd via wheelchair with belongings, with family.              Escorted by staff.   Cashmere Dingley,RN SWOT

## 2023-05-06 NOTE — TOC Transition Note (Signed)
Discharge medications are filled and stored in the [facility] awaiting patient discharge.  ** WILL NEED TO BE PICKED UP **

## 2023-05-06 NOTE — Discharge Summary (Addendum)
Physician Discharge Summary   Patient: Adrienne [name] MRN: [id number] DOB: [birth year]  Admit date:     05/04/2023  Discharge date: 05/06/23  Discharge Physician: York Ram Violetta Lavalle   PCP: Sigmund Hazel, MD   Recommendations at discharge:    Patient will continue diuresis with furosemide 40 mg po daily. Follow up renal function as outpatient in 7 days.  Continue antibiotic therapy with levofloxacin 250 mg po daily for 2 days.  No RAAS inhibition until renal function more stable. No SGLT 2 inh due to risk of recurrent urinary tract infection.  Follow up with Dr Hyacinth Meeker in 7 to 10 days. Follow up with Cardiology as scheduled.   Discharge Diagnoses: Principal Problem:   Acute on chronic diastolic CHF (congestive heart failure) (HCC) Active Problems:   Catheter-associated urinary tract infection (HCC)   CKD (chronic kidney disease), stage IIIb (HCC)   Permanent atrial fibrillation (HCC)   Coronary artery disease due to lipid rich plaque   Hyperlipidemia   History of stroke   Essential hypertension   Normocytic anemia   Malnutrition of moderate degree  Resolved Problems:   * No resolved hospital problems. [facility] Course: Adrienne [name] was admitted to the hospital with the working diagnosis of heart failure exacerbation.   89 y.o. F with severe bladder prolapse and chronic indwelling foley, atrial fibrillation, dCHF, pHTN, CAD, CKD IIIb and hx CVA who presented with pain at her foley, gross hematuria, orthopnea and increasing dyspnea on exertion. About 13 days prior to admission her diuretic regimen was increased due to volume overload. She was diagnosed with urinary tract infection as outpatient and treated with nitrofurantoin. 24 hrs prior to admission she noted orthopnea and PND, prompting her to come to the hospital. On her initial physical examination her RR was 22-30, blood pressure 119/57, HR 82, RR 27 and 02 saturation 92%, positive JVD, coarse breath sounds and  rales bilaterally, heart with S1 and S2 present irregularly irregular, abdomen with no distention and positive lower extremity edema.   Na 136, K 3.3, CL 96, bicarbonate 24, glucose 139, bun 20, cr 1,11  BNP 348 High sensitive troponin 18 and 13  Wbc 16,7 hgb 11,6 plt 227  Urine analysis SG 1,008, large leukocytes, glucose 50, protein negative. > 50 wbc  Chest radiograph with cardiomegaly, bilateral hilar vascular congestion and cephalization of the vasculature.   EKG 90 bpm, right axis deviation, interventricular conduction delay with prolonged qrs, atrial fibrillation rhythm with no significant ST segment or T wave changes.   Assessment and Plan: * Acute on chronic diastolic CHF (congestive heart failure) (HCC) Echocardiogram with preserved LV systolic function EF 60 to 65%, mild LVH, RV systolic function with mild reduction, RVSP 56,2 mmHg. LA with moderate dilatation, RA with severe dilatation, moderate MR, moderate TR.   Pulmonary hypertension Acute on chronic core pulmonale.   Patient was placed on IV furosemide for diuresis, negative fluid balance was achieved, - 4,850 ml, with significant improvement in her symptoms.   At the time of her discharge she will continue taking furosemide 40 mg po daily and follow up as outpatient.  Continue with metoprolol. Holding RAAS inhibition until renal function more stable.  Holding on SGLT 2 inh due to risk of urinary tract infection (chronic indwelling foley catheter with recurrent urinary tract infections). Follow up with cardiology as outpatient.    Catheter-associated urinary tract infection (HCC) Present on admission.  Urine culture with 10,000 CFU staphylococcus aureus.  01/2023 urine culture positive for  E. Coli more than 100,000 CFU.   Discharge wbc down to 15 and has remained afebrile.   Resistant to cefazolin and ampicillin but sensitive to flouroquinolones. Patient tolerated well levofloxacin in the past.  She had in the  hospital 3 days of  IV ceftriaxone and will complete with 2 more days with levofloxacin.    CKD (chronic kidney disease), stage IIIb (HCC) Hypokalemia, and hyponatremia.   Patient tolerated well diuresis with IV furosemide. At the time of her discharge her renal function has a serum cr of 1.38 with K at 3,4 and serum bicarbonate at 27.   Plan to continue furosemide 40 mg po daily.  Follow up renal function as outpatient.     Permanent atrial fibrillation (HCC) Patient remained rate controlled, plan to continue AV blockade with metoprolol and diltiazem. Anticoagulation with apixaban.   Coronary artery disease due to lipid rich plaque Continue blood pressure control.   Hyperlipidemia Continue statin therapy.   History of stroke Continue statin therapy and anticoagulation.   Essential hypertension Continue blood pressure control with metoprolol and diltiazem.   Normocytic anemia Hgb stable, no further hematuria.  Malnutrition of moderate degree Continue with nutritional supplements.          Consultants: cardiology  Procedures performed: none   Disposition: Home Diet recommendation:  Cardiac diet DISCHARGE MEDICATION: Allergies as of 05/06/2023       Reactions   Valium [diazepam] Other (See Comments)   HYPERACTIVITY   Amiodarone Hcl [amiodarone] Other (See Comments)   A-Fib   Compazine [prochlorperazine] Other (See Comments)   HYPERACTIVITY    Other Other (See Comments)   "Kidney dye"--patient states this gave her severe shakes/chills   Thorazine [chlorpromazine] Other (See Comments)   HYPERACTIVITY    Zometa [zoledronic Acid] Other (See Comments)   PROBLEMS WITH EYES TIA   Ciprofloxacin Swelling, Other (See Comments)   "swelling" in Rt.elbow   Doxycycline Nausea Only   Zoster Vac Recomb Adjuvanted Other (See Comments)   Welt under injection site   Iodinated Contrast Media Anxiety, Other (See Comments)   Chills and anxiety Other Reaction(s):  chills/shaking, Kidney dye        Medication List     STOP taking these medications    amoxicillin 500 MG capsule Commonly known as: AMOXIL   nitrofurantoin (macrocrystal-monohydrate) 100 MG capsule Commonly known as: MACROBID       TAKE these medications    acetaminophen 500 MG tablet Commonly known as: TYLENOL Take 1,000 mg by mouth every 12 (twelve) hours as needed for mild pain.   albuterol 108 (90 Base) MCG/ACT inhaler Commonly known as: Ventolin HFA Inhale 1 puff into the lungs every 6 (six) hours as needed for wheezing or shortness of breath (Cough).   allopurinol 100 MG tablet Commonly known as: ZYLOPRIM Take 1 tablet (100 mg total) by mouth daily.   apixaban 2.5 MG Tabs tablet Commonly known as: ELIQUIS Take 1 tablet (2.5 mg total) by mouth 2 (two) times daily.   ascorbic acid 500 MG tablet Commonly known as: VITAMIN C Take 500 mg by mouth 2 (two) times daily.   atorvastatin 10 MG tablet Commonly known as: LIPITOR TAKE 1 TABLET DAILY   calcium carbonate 1500 (600 Ca) MG Tabs tablet Commonly known as: OSCAL Take 1,500 mg by mouth 2 (two) times daily with a meal.   ciprofloxacin 500 MG tablet Commonly known as: CIPRO Take 1 tablet (500 mg total) by mouth daily for 2 days. Start taking on: May 07, 2023   cyanocobalamin 1000 MCG tablet Commonly known as: VITAMIN B12 Take 1,000 mcg by mouth daily.   diltiazem 60 MG tablet Commonly known as: CARDIZEM TAKE 1 TABLET THREE TIMES A DAY   docusate sodium 100 MG capsule Commonly known as: COLACE Take 100 mg by mouth 2 (two) times daily.   furosemide 20 MG tablet Commonly known as: LASIX Take 2 tablets (40 mg total) by mouth daily. What changed:  how much to take how to take this when to take this additional instructions   Iron 325 (65 Fe) MG Tabs Take 325 mg by mouth daily. (OTC)   metoprolol tartrate 100 MG tablet Commonly known as: LOPRESSOR TAKE 1 TABLET TWICE A DAY   multivitamin  with minerals tablet Take 1 tablet by mouth daily.   omeprazole 10 MG capsule Commonly known as: PRILOSEC Take 10 mg by mouth every other day.   polyethylene glycol 17 g packet Commonly known as: MIRALAX / GLYCOLAX Take 17 g by mouth daily.   UP4 PROBIOTICS WOMENS PO Take 1 capsule by mouth daily with breakfast.   Vitamin B Complex Caps Take 1 capsule by mouth daily.        Discharge Exam: Filed Weights   05/04/23 0256 05/05/23 0431 05/06/23 0325  Weight: 59.4 kg 57.7 kg 57.4 kg   BP (!) 151/74 (BP Location: Left Arm)   Pulse 88   Temp 97.8 F (36.6 C) (Oral)   Resp 16   Ht 4\' 11"  (1.499 m)   Wt 57.4 kg   LMP 09/12/1973 (Approximate)   SpO2 94%   BMI 25.56 kg/m   Patient is feeling better, she has bee out of bed and ambulating with rolling walker. No PND, orthopnea or dyspnea, no lower extremity edema.  Neurology awake and alert ENT with mild pallor Cardiovascular with S1 and S2 present irregularly irregular with systolic murmur at the apex, with no gallops or rubs Mild JVD No lower extremity edema Respiratory with no rales or wheezing, no rhonchi Abdomen with no distention   Condition at discharge: stable  The results of significant diagnostics from this hospitalization (including imaging, microbiology, ancillary and laboratory) are listed below for reference.   Imaging Studies: ECHOCARDIOGRAM COMPLETE  Result Date: 05/04/2023    ECHOCARDIOGRAM REPORT   Patient Name:   Adrienne [name] Date of Exam: 05/04/2023 Medical Rec #:  [id number]                Height:       59.0 in Accession #:    [id number]               Weight:       131.0 lb Date of Birth:  [birth year]                BSA:          1.541 m Patient Age:    89 years                 BP:           132/59 mmHg Patient Gender: F                        HR:           88 bpm. Exam Location:  Inpatient Procedure: 2D Echo, Cardiac Doppler and Color Doppler Indications:    I50.40* Unspecified combined  systolic (congestive) and diastolic                 (  congestive) heart failure  History:        Patient has prior history of Echocardiogram examinations, most                 recent 09/04/2022. Abnormal ECG, Pulmonary HTN and Stroke,                 Arrythmias:Atrial Fibrillation; Risk Factors:Hypertension.  Sonographer:    Sheralyn Boatman RDCS Referring Phys: [id number] RONDELL A SMITH IMPRESSIONS  1. Left ventricular ejection fraction, by estimation, is 60 to 65%. The left ventricle has normal function. The left ventricle has no regional wall motion abnormalities. There is mild left ventricular hypertrophy. Left ventricular diastolic parameters are indeterminate.  2. Right ventricular systolic function is mildly reduced. The right ventricular size is moderately enlarged. There is moderately elevated pulmonary artery systolic pressure. The estimated right ventricular systolic pressure is 56.2 mmHg.  3. Left atrial size was moderately dilated.  4. Right atrial size was severely dilated.  5. The mitral valve is degenerative. Moderate mitral valve regurgitation.  6. Tricuspid valve regurgitation is moderate.  7. The aortic valve is calcified. There is moderate calcification of the aortic valve. There is mild thickening of the aortic valve. Aortic valve regurgitation is trivial. Aortic valve sclerosis/calcification is present, without any evidence of aortic stenosis.  8. The inferior vena cava is dilated in size with <50% respiratory variability, suggesting right atrial pressure of 15 mmHg. Conclusion(s)/Recommendation(s): LVEF normal. Mildly reduced RV function with moderately elevated PA pressures and elevated RA pressure. FINDINGS  Left Ventricle: Left ventricular ejection fraction, by estimation, is 60 to 65%. The left ventricle has normal function. The left ventricle has no regional wall motion abnormalities. The left ventricular internal cavity size was small. There is mild left ventricular hypertrophy. Abnormal  (paradoxical) septal motion, consistent with left bundle branch block. Left ventricular diastolic parameters are indeterminate. Right Ventricle: The right ventricular size is moderately enlarged. No increase in right ventricular wall thickness. Right ventricular systolic function is mildly reduced. There is moderately elevated pulmonary artery systolic pressure. The tricuspid regurgitant velocity is 3.21 m/s, and with an assumed right atrial pressure of 15 mmHg, the estimated right ventricular systolic pressure is 56.2 mmHg. Left Atrium: Left atrial size was moderately dilated. Right Atrium: Right atrial size was severely dilated. Pericardium: There is no evidence of pericardial effusion. Mitral Valve: The mitral valve is degenerative in appearance. Mild mitral annular calcification. Moderate mitral valve regurgitation. MV peak gradient, 9.5 mmHg. The mean mitral valve gradient is 4.0 mmHg. Tricuspid Valve: The tricuspid valve is normal in structure. Tricuspid valve regurgitation is moderate . No evidence of tricuspid stenosis. Aortic Valve: The aortic valve is calcified. There is moderate calcification of the aortic valve. There is mild thickening of the aortic valve. Aortic valve regurgitation is trivial. Aortic valve sclerosis/calcification is present, without any evidence of aortic stenosis. Aortic valve mean gradient measures 7.0 mmHg. Aortic valve peak gradient measures 12.8 mmHg. Aortic valve area, by VTI measures 2.48 cm. Pulmonic Valve: The pulmonic valve was not well visualized. Pulmonic valve regurgitation is not visualized. No evidence of pulmonic stenosis. Aorta: The aortic root, ascending aorta, aortic arch and descending aorta are all structurally normal, with no evidence of dilitation or obstruction. Venous: The inferior vena cava is dilated in size with less than 50% respiratory variability, suggesting right atrial pressure of 15 mmHg. IAS/Shunts: No atrial level shunt detected by color flow  Doppler. Additional Comments: There is pleural effusion in the left lateral region.  LEFT VENTRICLE PLAX 2D LVIDd:         4.00 cm LVIDs:         2.45 cm LV PW:         1.20 cm LV IVS:        1.10 cm LVOT diam:     2.20 cm LV SV:         89 LV SV Index:   58 LVOT Area:     3.80 cm  LV Volumes (MOD) LV vol d, MOD A2C: 73.0 ml LV vol d, MOD A4C: 64.6 ml LV vol s, MOD A2C: 23.6 ml LV vol s, MOD A4C: 21.2 ml LV SV MOD A2C:     49.4 ml LV SV MOD A4C:     64.6 ml LV SV MOD BP:      45.1 ml RIGHT VENTRICLE            IVC RV S prime:     9.14 cm/s  IVC diam: 2.40 cm TAPSE (M-mode): 1.1 cm LEFT ATRIUM             Index        RIGHT ATRIUM           Index LA diam:        4.90 cm 3.18 cm/m   RA Area:     24.40 cm LA Vol (A2C):   49.6 ml 32.20 ml/m  RA Volume:   72.40 ml  47.00 ml/m LA Vol (A4C):   60.7 ml 39.40 ml/m LA Biplane Vol: 58.4 ml 37.91 ml/m  AORTIC VALVE AV Area (Vmax):    2.85 cm AV Area (Vmean):   2.47 cm AV Area (VTI):     2.48 cm AV Vmax:           179.00 cm/s AV Vmean:          123.000 cm/s AV VTI:            0.360 m AV Peak Grad:      12.8 mmHg AV Mean Grad:      7.0 mmHg LVOT Vmax:         134.00 cm/s LVOT Vmean:        79.800 cm/s LVOT VTI:          0.235 m LVOT/AV VTI ratio: 0.65  AORTA Ao Root diam: 3.10 cm Ao Asc diam:  3.40 cm MITRAL VALVE                TRICUSPID VALVE MV Area (PHT): 5.27 cm     TR Peak grad:   41.2 mmHg MV Area VTI:   3.63 cm     TR Vmax:        321.00 cm/s MV Peak grad:  9.5 mmHg MV Mean grad:  4.0 mmHg     SHUNTS MV Vmax:       1.54 m/s     Systemic VTI:  0.24 m MV Vmean:      83.8 cm/s    Systemic Diam: 2.20 cm MV Decel Time: 144 msec MR Peak grad: 69.2 mmHg MR Mean grad: 47.0 mmHg MR Vmax:      416.00 cm/s MR Vmean:     327.0 cm/s MV E velocity: 112.00 cm/s Jodelle Red MD Electronically signed by Jodelle Red MD Signature Date/Time: 05/04/2023/1:10:59 PM    Final    DG Chest Portable 1 View  Result Date: 05/04/2023 CLINICAL DATA:  Shortness of  breath.  History of CHF EXAM: PORTABLE  CHEST 1 VIEW COMPARISON:  Radiographs 03/28/2023 FINDINGS: Stable cardiomegaly. Aortic atherosclerotic calcification. Mitral annular calcification. Pulmonary venous congestion with interstitial coarsening greatest in the left lower lung. Left basilar atelectasis. No definite pleural effusion. No pneumothorax. No displaced rib fractures. IMPRESSION: Cardiomegaly and mild interstitial edema. Electronically Signed   By: Minerva Fester M.D.   On: 05/04/2023 03:38    Microbiology: Results for orders placed or performed during the hospital encounter of 05/04/23  Resp panel by RT-PCR (RSV, Flu A&B, Covid) Anterior Nasal Swab     Status: None   Collection Time: 05/04/23  4:46 AM   Specimen: Anterior Nasal Swab  Result Value Ref Range Status   SARS Coronavirus 2 by RT PCR NEGATIVE NEGATIVE Final   Influenza A by PCR NEGATIVE NEGATIVE Final   Influenza B by PCR NEGATIVE NEGATIVE Final    Comment: (NOTE) The Xpert Xpress SARS-CoV-2/FLU/RSV plus assay is intended as an aid in the diagnosis of influenza from Nasopharyngeal swab specimens and should not be used as a sole basis for treatment. Nasal washings and aspirates are unacceptable for Xpert Xpress SARS-CoV-2/FLU/RSV testing.  Fact Sheet for Patients: BloggerCourse.com  Fact Sheet for Healthcare Providers: SeriousBroker.it  This test is not yet approved or cleared by the [place] FDA and has been authorized for detection and/or diagnosis of SARS-CoV-2 by FDA under an Emergency Use Authorization (EUA). This EUA will remain in effect (meaning this test can be used) for the duration of the COVID-19 declaration under Section 564(b)(1) of the Act, 21 U.S.C. section 360bbb-3(b)(1), unless the authorization is terminated or revoked.     Resp Syncytial Virus by PCR NEGATIVE NEGATIVE Final    Comment: (NOTE) Fact Sheet for  Patients: BloggerCourse.com  Fact Sheet for Healthcare Providers: SeriousBroker.it  This test is not yet approved or cleared by the [place] FDA and has been authorized for detection and/or diagnosis of SARS-CoV-2 by FDA under an Emergency Use Authorization (EUA). This EUA will remain in effect (meaning this test can be used) for the duration of the COVID-19 declaration under Section 564(b)(1) of the Act, 21 U.S.C. section 360bbb-3(b)(1), unless the authorization is terminated or revoked.  Performed at [facility], [address], [place], [place] [address]   Remove and replace urinary cath (placed > 5 days) then obtain urine culture from new indwelling urinary catheter.     Status: Abnormal   Collection Time: 05/04/23  8:14 AM   Specimen: Urine, Catheterized  Result Value Ref Range Status   Specimen Description URINE, CATHETERIZED  Final   Special Requests   Final    NONE Performed at [facility], [address], [place], [place] [address]    Culture (A)  Final    10,000 COLONIES/mL METHICILLIN RESISTANT STAPHYLOCOCCUS AUREUS   Report Status 05/06/2023 FINAL  Final   Organism ID, Bacteria METHICILLIN RESISTANT STAPHYLOCOCCUS AUREUS (A)  Final      Susceptibility   Methicillin resistant staphylococcus aureus - MIC*    CIPROFLOXACIN >=8 RESISTANT Resistant     GENTAMICIN <=0.5 SENSITIVE Sensitive     NITROFURANTOIN 32 SENSITIVE Sensitive     OXACILLIN >=4 RESISTANT Resistant     TETRACYCLINE <=1 SENSITIVE Sensitive     VANCOMYCIN <=0.5 SENSITIVE Sensitive     TRIMETH/SULFA <=10 SENSITIVE Sensitive     CLINDAMYCIN RESISTANT Resistant     RIFAMPIN <=0.5 SENSITIVE Sensitive     Inducible Clindamycin POSITIVE Resistant     LINEZOLID 2 SENSITIVE Sensitive     *  10,000 COLONIES/mL METHICILLIN RESISTANT STAPHYLOCOCCUS AUREUS    Labs: CBC: Recent Labs  Lab 05/04/23 0306 05/05/23 0310 05/06/23 0440  WBC  16.7* 18.2* 15.0*  NEUTROABS 12.9*  --   --   HGB 11.6* 10.5* 11.0*  HCT 36.2 31.6* 33.7*  MCV 97.3 94.0 97.1  PLT 227 182 202   Basic Metabolic Panel: Recent Labs  Lab 05/04/23 0306 05/05/23 0310 05/06/23 0440  NA 132* 135 136  K 3.3* 4.3 3.4*  CL 95* 99 96*  CO2 24 29 27   GLUCOSE 139* 119* 87  BUN 20 28* 35*  CREATININE 1.11* 1.33* 1.38*  CALCIUM 9.1 8.6* 8.7*   Liver Function Tests: Recent Labs  Lab 05/04/23 0306  AST 21  ALT 14  ALKPHOS 80  BILITOT 0.6  PROT 6.8  ALBUMIN 3.7   CBG: No results for input(s): "GLUCAP" in the last 168 hours.  Discharge time spent: greater than 30 minutes.  Signed: Coralie Keens, MD [facility] 05/06/2023

## 2023-05-06 NOTE — Assessment & Plan Note (Signed)
Continue blood pressure control. 

## 2023-05-06 NOTE — TOC Transition Note (Signed)
Transition of Care [facility]) - CM/SW Discharge Note   Patient Details  Name: Adrienne [name] MRN: [id number] Date of Birth: March 21, 1928  [facility] CM/SW Contact:  Lawerance Sabal, RN Phone Number: 05/06/2023, 1:56 PM   Clinical Narrative:     Sherron Monday w patient and spouse at bedside. Patient was standing at sink doing her hair.  She states that she has HH services through her long term care insurance and does not want additional HH services, she had [facility] at the beginning of the year and was happy with them but does not want the burden of the extra visits and services at this time. She states that she has a home nurse through the other provider.  She states she has all needed DME at home including oxygen.   Final next level of care: Home/Self Care Barriers to Discharge: No Barriers Identified   Patient Goals and CMS Choice   Choice offered to / list presented to : NA  Discharge Placement                         Discharge Plan and Services Additional resources added to the After Visit Summary for   In-house Referral: NA Discharge Planning Services: CM Consult Post Acute Care Choice: NA          DME Arranged: N/A         HH Arranged: Refused HH          Social Determinants of Health (SDOH) Interventions SDOH Screenings   Food Insecurity: No Food Insecurity (05/04/2023)  Housing: Patient Declined (05/04/2023)  Transportation Needs: No Transportation Needs (05/04/2023)  Utilities: Not At Risk (05/04/2023)  Tobacco Use: Medium Risk (05/04/2023)     Readmission Risk Interventions    05/05/2023    5:19 PM 02/06/2023    3:10 PM 12/18/2022   11:03 AM  Readmission Risk Prevention Plan  Transportation Screening Complete Complete Complete  PCP or Specialist Appt within 3-5 Days   Complete  HRI or Home Care Consult   Complete  Social Work Consult for Recovery Care Planning/Counseling   Complete  Palliative Care Screening   Not Applicable  Medication  Review Oceanographer) Complete Complete Complete  PCP or Specialist appointment within 3-5 days of discharge  Complete   HRI or Home Care Consult Complete Complete   SW Recovery Care/Counseling Consult  Complete   Palliative Care Screening Not Applicable Not Applicable   Skilled Nursing Facility Not Applicable Not Applicable

## 2023-05-06 NOTE — Assessment & Plan Note (Signed)
Continue with nutritional supplements.  

## 2023-05-06 NOTE — Assessment & Plan Note (Signed)
Continue statin therapy and anticoagulation.

## 2023-05-06 NOTE — Plan of Care (Signed)
?  Problem: Education: ?Goal: Ability to demonstrate management of disease process will improve ?Outcome: Adequate for Discharge ?Goal: Ability to verbalize understanding of medication therapies will improve ?Outcome: Adequate for Discharge ?Goal: Individualized Educational Video(s) ?Outcome: Adequate for Discharge ?  ?Problem: Activity: ?Goal: Capacity to carry out activities will improve ?Outcome: Adequate for Discharge ?  ?Problem: Cardiac: ?Goal: Ability to achieve and maintain adequate cardiopulmonary perfusion will improve ?Outcome: Adequate for Discharge ?  ?Problem: Education: ?Goal: Knowledge of General Education information will improve ?Description: Including pain rating scale, medication(s)/side effects and non-pharmacologic comfort measures ?Outcome: Adequate for Discharge ?  ?Problem: Health Behavior/Discharge Planning: ?Goal: Ability to manage health-related needs will improve ?Outcome: Adequate for Discharge ?  ?Problem: Clinical Measurements: ?Goal: Ability to maintain clinical measurements within normal limits will improve ?Outcome: Adequate for Discharge ?Goal: Will remain free from infection ?Outcome: Adequate for Discharge ?Goal: Diagnostic test results will improve ?Outcome: Adequate for Discharge ?Goal: Respiratory complications will improve ?Outcome: Adequate for Discharge ?Goal: Cardiovascular complication will be avoided ?Outcome: Adequate for Discharge ?  ?Problem: Activity: ?Goal: Risk for activity intolerance will decrease ?Outcome: Adequate for Discharge ?  ?Problem: Nutrition: ?Goal: Adequate nutrition will be maintained ?Outcome: Adequate for Discharge ?  ?Problem: Coping: ?Goal: Level of anxiety will decrease ?Outcome: Adequate for Discharge ?  ?Problem: Elimination: ?Goal: Will not experience complications related to bowel motility ?Outcome: Adequate for Discharge ?Goal: Will not experience complications related to urinary retention ?Outcome: Adequate for Discharge ?  ?Problem: Pain  Managment: ?Goal: General experience of comfort will improve ?Outcome: Adequate for Discharge ?  ?Problem: Safety: ?Goal: Ability to remain free from injury will improve ?Outcome: Adequate for Discharge ?  ?Problem: Skin Integrity: ?Goal: Risk for impaired skin integrity will decrease ?Outcome: Adequate for Discharge ?  ?Problem: Education: ?Goal: Ability to demonstrate management of disease process will improve ?Outcome: Adequate for Discharge ?Goal: Ability to verbalize understanding of medication therapies will improve ?Outcome: Adequate for Discharge ?Goal: Individualized Educational Video(s) ?Outcome: Adequate for Discharge ?  ?Problem: Activity: ?Goal: Capacity to carry out activities will improve ?Outcome: Adequate for Discharge ?  ?Problem: Cardiac: ?Goal: Ability to achieve and maintain adequate cardiopulmonary perfusion will improve ?Outcome: Adequate for Discharge ?  ?

## 2023-05-10 ENCOUNTER — Other Ambulatory Visit (HOSPITAL_COMMUNITY): Payer: Self-pay

## 2023-05-10 ENCOUNTER — Telehealth: Payer: Self-pay | Admitting: Cardiology

## 2023-05-10 NOTE — Telephone Encounter (Signed)
Recommendations at discharge:     Patient will continue diuresis with furosemide 40 mg po daily. Follow up renal function as outpatient in 7 days.  Continue antibiotic therapy with levofloxacin 250 mg po daily for 2 days.  No RAAS inhibition until renal function more stable. No SGLT 2 inh due to risk of recurrent urinary tract infection.  Follow up with Dr Hyacinth Meeker in 7 to 10 days. Follow up with Cardiology as scheduled.   Spoke with pt who is aware to continue medications as listed.  She has f/u appt with Dr Sigmund Hazel scheduled soon and will have blood work then.

## 2023-05-10 NOTE — Telephone Encounter (Signed)
Patient states when she was in the ED they increased her lasix to 40 mg daily. She wants to make sure you are on agreement with the change. Did advise that she should follow discharge instructions.

## 2023-05-10 NOTE — Telephone Encounter (Signed)
Pt c/o medication issue:  1. Name of Medication:   furosemide (LASIX) 20 MG tablet    2. How are you currently taking this medication (dosage and times per day)?   Take 2 tablets (40 mg total) by mouth daily.    3. Are you having a reaction (difficulty breathing--STAT)? No  4. What is your medication issue? Pt states the hospital increased the dosage and she wants to know if this is okay with the Doctor. Please advise

## 2023-05-12 ENCOUNTER — Ambulatory Visit: Payer: Medicare Other

## 2023-05-16 DIAGNOSIS — Z6827 Body mass index (BMI) 27.0-27.9, adult: Secondary | ICD-10-CM | POA: Diagnosis not present

## 2023-05-16 DIAGNOSIS — I5033 Acute on chronic diastolic (congestive) heart failure: Secondary | ICD-10-CM | POA: Diagnosis not present

## 2023-05-16 DIAGNOSIS — Z23 Encounter for immunization: Secondary | ICD-10-CM | POA: Diagnosis not present

## 2023-05-16 DIAGNOSIS — T83511D Infection and inflammatory reaction due to indwelling urethral catheter, subsequent encounter: Secondary | ICD-10-CM | POA: Diagnosis not present

## 2023-05-22 DIAGNOSIS — R338 Other retention of urine: Secondary | ICD-10-CM | POA: Diagnosis not present

## 2023-05-24 DIAGNOSIS — Z9989 Dependence on other enabling machines and devices: Secondary | ICD-10-CM | POA: Diagnosis not present

## 2023-05-24 DIAGNOSIS — Z6821 Body mass index (BMI) 21.0-21.9, adult: Secondary | ICD-10-CM | POA: Diagnosis not present

## 2023-05-24 DIAGNOSIS — I5033 Acute on chronic diastolic (congestive) heart failure: Secondary | ICD-10-CM | POA: Diagnosis not present

## 2023-05-24 DIAGNOSIS — Z7409 Other reduced mobility: Secondary | ICD-10-CM | POA: Diagnosis not present

## 2023-05-24 DIAGNOSIS — Z978 Presence of other specified devices: Secondary | ICD-10-CM | POA: Diagnosis not present

## 2023-05-25 DIAGNOSIS — N3000 Acute cystitis without hematuria: Secondary | ICD-10-CM | POA: Diagnosis not present

## 2023-05-25 DIAGNOSIS — R338 Other retention of urine: Secondary | ICD-10-CM | POA: Diagnosis not present

## 2023-05-30 ENCOUNTER — Other Ambulatory Visit: Payer: Self-pay

## 2023-05-30 ENCOUNTER — Emergency Department (HOSPITAL_BASED_OUTPATIENT_CLINIC_OR_DEPARTMENT_OTHER)
Admission: EM | Admit: 2023-05-30 | Discharge: 2023-05-30 | Disposition: A | Discharge status: Home / Self Care | Payer: Medicare Other | Attending: Emergency Medicine | Admitting: Emergency Medicine

## 2023-05-30 DIAGNOSIS — Z79899 Other long term (current) drug therapy: Secondary | ICD-10-CM | POA: Diagnosis not present

## 2023-05-30 DIAGNOSIS — N3289 Other specified disorders of bladder: Secondary | ICD-10-CM | POA: Insufficient documentation

## 2023-05-30 DIAGNOSIS — N3001 Acute cystitis with hematuria: Secondary | ICD-10-CM

## 2023-05-30 DIAGNOSIS — N390 Urinary tract infection, site not specified: Secondary | ICD-10-CM | POA: Diagnosis not present

## 2023-05-30 DIAGNOSIS — Z7901 Long term (current) use of anticoagulants: Secondary | ICD-10-CM | POA: Insufficient documentation

## 2023-05-30 LAB — URINALYSIS, ROUTINE W REFLEX MICROSCOPIC
Bacteria, UA: NONE SEEN
Bilirubin Urine: NEGATIVE
Glucose, UA: NEGATIVE mg/dL
Ketones, ur: NEGATIVE mg/dL
Nitrite: POSITIVE — AB
Protein, ur: 30 mg/dL — AB
RBC / HPF: >50 RBC/hpf (ref 0–5)
Specific Gravity, Urine: 1.008 (ref 1.005–1.030)
WBC, UA: >50 WBC/hpf (ref 0–5)
pH: 7 (ref 5.0–8.0)

## 2023-05-30 MED ORDER — PHENAZOPYRIDINE HCL 100 MG PO TABS
95.0000 mg | ORAL_TABLET | Freq: Once | ORAL | Status: AC
  Administered 2023-05-30: 100 mg via ORAL
  Filled 2023-05-30: qty 1

## 2023-05-30 MED ORDER — PHENAZOPYRIDINE HCL 200 MG PO TABS: 200.0000 mg | ORAL_TABLET | Freq: Three times a day (TID) | ORAL | 0 refills | Status: AC

## 2023-05-30 NOTE — ED Notes (Signed)
70 mLs sterile NS flushed - and returned and catheter.

## 2023-05-30 NOTE — Discharge Instructions (Addendum)
Please follow-up with your urologist in regards to recent symptoms and ER visit.  Today your catheter was flushed and is working properly.  I have also prescribed for you 2 days worth of Azo that he may take for the bladder spasms.  You may continue to use Tylenol every 6 hours as needed for pain.  Please continue use the Macrobid prescribed to you and follow-up with your urologist.  If symptoms change or worsen please return to ER.

## 2023-05-30 NOTE — ED Triage Notes (Addendum)
Suspects UTI. Bladder pain and spasms, leakage around urinary catheter with leg bag. Appears to be draining well no feeling of fullness. Catheter use for 2 yrs now. Saw urology last week-prescribed nitrofurantoin on Monday-took first dose today due to some confusion with office. Concerned PO antibiotics are not enough and seeking relief from the spasms. Afebrile. A+Ox4.

## 2023-05-30 NOTE — ED Provider Notes (Addendum)
[place] EMERGENCY DEPARTMENT AT [facility] Provider Note   CSN: [id number] Arrival date & time: 05/30/23  7829     History  No chief complaint on file.   Adrienne [name] is a 89 y.o. female history of indwelling urinary catheter presented for bladder spasms and UTI.  Patient has been having UTI symptoms for the past week and sees urology and was originally placed on Levaquin however had this medication changed to Macrobid earlier today but is unsure if this will cover the urine.  Urology did say that this medication should cover for her UTI.  Patient was also concerned about the bladder spasms and although she took 2 Tylenol at home is afraid that these may come back.  Patient stated that while she was having the spasms she was having urinary leakage and is concerned that the Foley catheter is not draining.  Patient denies fevers, flank pain abdominal pain, nausea/vomiting  Home Medications Prior to Admission medications   Medication Sig Start Date End Date Taking? Authorizing Provider  phenazopyridine (PYRIDIUM) 200 MG tablet Take 1 tablet (200 mg total) by mouth 3 (three) times daily for 2 days. 05/30/23 06/01/23 Yes Rochell Puett, Beverly Gust, PA-C  acetaminophen (TYLENOL) 500 MG tablet Take 1,000 mg by mouth every 12 (twelve) hours as needed for mild pain.    [provider]  albuterol (VENTOLIN HFA) 108 (90 Base) MCG/ACT inhaler Inhale 1 puff into the lungs every 6 (six) hours as needed for wheezing or shortness of breath (Cough). 09/08/22   Regalado, Belkys A, MD  allopurinol (ZYLOPRIM) 100 MG tablet Take 1 tablet (100 mg total) by mouth daily. 09/08/22   Regalado, Belkys A, MD  apixaban (ELIQUIS) 2.5 MG TABS tablet Take 1 tablet (2.5 mg total) by mouth 2 (two) times daily. 10/07/22   Jake Bathe, MD  ascorbic acid (VITAMIN C) 500 MG tablet Take 500 mg by mouth 2 (two) times daily.    [provider]  atorvastatin (LIPITOR) 10 MG tablet TAKE 1 TABLET  DAILY 11/28/22   Jake Bathe, MD  B Complex Vitamins (VITAMIN B COMPLEX) CAPS Take 1 capsule by mouth daily.    [provider]  calcium carbonate (OSCAL) 1500 (600 Ca) MG TABS tablet Take 1,500 mg by mouth 2 (two) times daily with a meal.    [provider]  cyanocobalamin (VITAMIN B12) 1000 MCG tablet Take 1,000 mcg by mouth daily.    [provider]  diltiazem (CARDIZEM) 60 MG tablet TAKE 1 TABLET THREE TIMES A DAY 11/28/22   Jake Bathe, MD  docusate sodium (COLACE) 100 MG capsule Take 100 mg by mouth 2 (two) times daily.    [provider]  Ferrous Sulfate (IRON) 325 (65 Fe) MG TABS Take 325 mg by mouth daily. (OTC)    [provider]  furosemide (LASIX) 20 MG tablet Take 2 tablets (40 mg total) by mouth daily. 05/06/23   Arrien, York Ram, MD  metoprolol tartrate (LOPRESSOR) 100 MG tablet TAKE 1 TABLET TWICE A DAY 11/28/22   Jake Bathe, MD  Multiple Vitamins-Minerals (MULTIVITAMIN WITH MINERALS) tablet Take 1 tablet by mouth daily.    [provider]  omeprazole (PRILOSEC) 10 MG capsule Take 10 mg by mouth every other day. 03/21/19   [provider]  polyethylene glycol (MIRALAX / GLYCOLAX) 17 g packet Take 17 g by mouth daily.    [provider]  Probiotic Product (UP4 PROBIOTICS WOMENS PO) Take 1 capsule by  mouth daily with breakfast.    [provider]      Allergies    Valium [diazepam], Amiodarone hcl [amiodarone], Compazine [prochlorperazine], Other, Thorazine [chlorpromazine], Zometa [zoledronic acid], Ciprofloxacin, Doxycycline, Zoster vac recomb adjuvanted, and Iodinated contrast media    Review of Systems   Review of Systems  Physical Exam Updated Vital Signs BP (!) 150/72   Pulse 68   Temp 98.1 F (36.7 C) (Oral)   Resp 18   Wt 56.7 kg   LMP 09/12/1973 (Approximate)   SpO2 99%   BMI 25.25 kg/m  Physical Exam Constitutional:      General: She is not in acute  distress. Abdominal:     Palpations: Abdomen is soft.     Tenderness: There is no abdominal tenderness. There is no guarding or rebound.  Skin:    General: Skin is warm and dry.  Neurological:     Mental Status: She is alert.     ED Results / Procedures / Treatments   Labs (all labs ordered are listed, but only abnormal results are displayed) Labs Reviewed  URINALYSIS, ROUTINE W REFLEX MICROSCOPIC - Abnormal; Notable for the following components:      Result Value   APPearance HAZY (*)    Hgb urine dipstick LARGE (*)    Protein, ur 30 (*)    Nitrite POSITIVE (*)    Leukocytes,Ua LARGE (*)    All other components within normal limits    EKG None  Radiology No results found.  Procedures Procedures    Medications Ordered in ED Medications  phenazopyridine (PYRIDIUM) tablet 100 mg (has no administration in time range)    ED Course/ Medical Decision Making/ A&P                                 Medical Decision Making Amount and/or Complexity of Data Reviewed Labs: ordered.  Risk Prescription drug management.   Adrienne [name] 89 y.o. presented today for UTI, bladder spasms. Working DDx that I considered at this time includes, but not limited to, UTI, pyelonephritis, Foley catheter obstruction, nephrolithiasis.  R/o DDx: pyelonephritis, Foley catheter obstruction, nephrolithiasis: These are considered less likely due to history of present illness, physical exam, labs/imaging findings  Review of prior external notes: 05/06/2023 discharge summary  Unique Tests and My Interpretation:  UA: Nitrate positive large hemoglobin, large leukocytes  Discussion with Independent Historian:  Daughter  Discussion of Management of Tests: None  Risk: Medium: prescription drug management  Risk Stratification Score: None  Staffed with [place], MD  Plan: On exam patient was in no acute distress stable vitals.  Patient did not have any abdominal tenderness and  was not endorsing any flank pain.  Patient's urine from triage does show UTI and after discussing with the patient and patient's daughter it appears that urology did switch patient's medications from Levaquin to [facility] today and patient is taken 1 dose of the Macrobid.  The most recent urine culture was from 05/04/2023 and it appears that the bacteria was susceptible to Macrobid at that time.  I spoke to the patient extensively about how if the urologist is telling her that the UTI is susceptible to the [facility] along with the most recent culture we have showing that her UTI susceptible to Macrobid.  Patient will be given Azo to help with her bladder spasms as this is another concern that we can address.  Patient's Foley will be flushed  and anticipate discharge on 2 days of Azo with urology follow-up.  Patient was given return precautions. Patient stable for discharge at this time.  Patient verbalized understanding of plan.         Final Clinical Impression(s) / ED Diagnoses Final diagnoses:  Acute cystitis with hematuria    Rx / DC Orders ED Discharge Orders          Ordered    phenazopyridine (PYRIDIUM) 200 MG tablet  3 times daily        05/30/23 2101              Netta Corrigan, PA-C 05/30/23 2101    Netta Corrigan, PA-C 05/30/23 2120    Jacalyn Lefevre, MD 05/30/23 2213

## 2023-06-01 ENCOUNTER — Other Ambulatory Visit: Payer: Self-pay | Admitting: Cardiology

## 2023-06-02 ENCOUNTER — Telehealth: Payer: Self-pay | Admitting: Cardiology

## 2023-06-02 NOTE — Telephone Encounter (Signed)
*  STAT* If patient is at the pharmacy, call can be transferred to refill team.   1. Which medications need to be refilled? (please list name of each medication and dose if known) furosemide (LASIX) 40 MG tablet    2. Would you like to learn more about the convenience, safety, & potential cost savings by using the [facility]?      3. Are you open to using the [facility] (Type [facility]. ).   4. Which pharmacy/location (including street and city if local pharmacy) is medication to be sent to?  [id number] - [place],  - [address]    5. Do they need a 30 day or 90 day supply? 90 day   Patient is out of medication

## 2023-06-03 ENCOUNTER — Encounter (HOSPITAL_BASED_OUTPATIENT_CLINIC_OR_DEPARTMENT_OTHER): Payer: Self-pay

## 2023-06-03 ENCOUNTER — Inpatient Hospital Stay (HOSPITAL_BASED_OUTPATIENT_CLINIC_OR_DEPARTMENT_OTHER)
Admission: EM | Admit: 2023-06-03 | Discharge: 2023-06-07 | DRG: 698 | Disposition: A | Discharge status: Home Health | Payer: Medicare Other | Attending: Family Medicine | Admitting: Family Medicine

## 2023-06-03 ENCOUNTER — Emergency Department (HOSPITAL_BASED_OUTPATIENT_CLINIC_OR_DEPARTMENT_OTHER): Payer: Medicare Other

## 2023-06-03 DIAGNOSIS — N2889 Other specified disorders of kidney and ureter: Secondary | ICD-10-CM | POA: Diagnosis not present

## 2023-06-03 DIAGNOSIS — I700 Atherosclerosis of aorta: Secondary | ICD-10-CM | POA: Diagnosis not present

## 2023-06-03 DIAGNOSIS — Z7901 Long term (current) use of anticoagulants: Secondary | ICD-10-CM | POA: Diagnosis not present

## 2023-06-03 DIAGNOSIS — D631 Anemia in chronic kidney disease: Secondary | ICD-10-CM | POA: Diagnosis present

## 2023-06-03 DIAGNOSIS — E785 Hyperlipidemia, unspecified: Secondary | ICD-10-CM | POA: Diagnosis present

## 2023-06-03 DIAGNOSIS — R262 Difficulty in walking, not elsewhere classified: Secondary | ICD-10-CM | POA: Diagnosis present

## 2023-06-03 DIAGNOSIS — Z8673 Personal history of transient ischemic attack (TIA), and cerebral infarction without residual deficits: Secondary | ICD-10-CM | POA: Diagnosis not present

## 2023-06-03 DIAGNOSIS — I509 Heart failure, unspecified: Secondary | ICD-10-CM | POA: Diagnosis not present

## 2023-06-03 DIAGNOSIS — I5033 Acute on chronic diastolic (congestive) heart failure: Secondary | ICD-10-CM | POA: Diagnosis present

## 2023-06-03 DIAGNOSIS — Z803 Family history of malignant neoplasm of breast: Secondary | ICD-10-CM

## 2023-06-03 DIAGNOSIS — Z96653 Presence of artificial knee joint, bilateral: Secondary | ICD-10-CM | POA: Diagnosis present

## 2023-06-03 DIAGNOSIS — N3001 Acute cystitis with hematuria: Secondary | ICD-10-CM

## 2023-06-03 DIAGNOSIS — N1832 Chronic kidney disease, stage 3b: Secondary | ICD-10-CM | POA: Diagnosis present

## 2023-06-03 DIAGNOSIS — Z79899 Other long term (current) drug therapy: Secondary | ICD-10-CM | POA: Diagnosis not present

## 2023-06-03 DIAGNOSIS — T83511A Infection and inflammatory reaction due to indwelling urethral catheter, initial encounter: Secondary | ICD-10-CM | POA: Diagnosis not present

## 2023-06-03 DIAGNOSIS — Z800 Family history of malignant neoplasm of digestive organs: Secondary | ICD-10-CM

## 2023-06-03 DIAGNOSIS — R918 Other nonspecific abnormal finding of lung field: Secondary | ICD-10-CM | POA: Diagnosis present

## 2023-06-03 DIAGNOSIS — D6869 Other thrombophilia: Secondary | ICD-10-CM | POA: Diagnosis present

## 2023-06-03 DIAGNOSIS — N200 Calculus of kidney: Secondary | ICD-10-CM | POA: Diagnosis not present

## 2023-06-03 DIAGNOSIS — Z923 Personal history of irradiation: Secondary | ICD-10-CM

## 2023-06-03 DIAGNOSIS — K219 Gastro-esophageal reflux disease without esophagitis: Secondary | ICD-10-CM | POA: Diagnosis present

## 2023-06-03 DIAGNOSIS — I340 Nonrheumatic mitral (valve) insufficiency: Secondary | ICD-10-CM | POA: Diagnosis present

## 2023-06-03 DIAGNOSIS — I69398 Other sequelae of cerebral infarction: Secondary | ICD-10-CM

## 2023-06-03 DIAGNOSIS — N390 Urinary tract infection, site not specified: Principal | ICD-10-CM | POA: Diagnosis present

## 2023-06-03 DIAGNOSIS — I272 Pulmonary hypertension, unspecified: Secondary | ICD-10-CM | POA: Diagnosis present

## 2023-06-03 DIAGNOSIS — Z87891 Personal history of nicotine dependence: Secondary | ICD-10-CM | POA: Diagnosis not present

## 2023-06-03 DIAGNOSIS — Z853 Personal history of malignant neoplasm of breast: Secondary | ICD-10-CM

## 2023-06-03 DIAGNOSIS — Y846 Urinary catheterization as the cause of abnormal reaction of the patient, or of later complication, without mention of misadventure at the time of the procedure: Secondary | ICD-10-CM | POA: Diagnosis present

## 2023-06-03 DIAGNOSIS — I130 Hypertensive heart and chronic kidney disease with heart failure and stage 1 through stage 4 chronic kidney disease, or unspecified chronic kidney disease: Secondary | ICD-10-CM | POA: Diagnosis present

## 2023-06-03 DIAGNOSIS — N3289 Other specified disorders of bladder: Secondary | ICD-10-CM | POA: Diagnosis present

## 2023-06-03 DIAGNOSIS — K2100 Gastro-esophageal reflux disease with esophagitis, without bleeding: Secondary | ICD-10-CM

## 2023-06-03 DIAGNOSIS — I4821 Permanent atrial fibrillation: Secondary | ICD-10-CM | POA: Diagnosis not present

## 2023-06-03 DIAGNOSIS — Z66 Do not resuscitate: Secondary | ICD-10-CM | POA: Diagnosis present

## 2023-06-03 DIAGNOSIS — R64 Cachexia: Secondary | ICD-10-CM | POA: Diagnosis present

## 2023-06-03 DIAGNOSIS — Z9011 Acquired absence of right breast and nipple: Secondary | ICD-10-CM | POA: Diagnosis not present

## 2023-06-03 DIAGNOSIS — Z87440 Personal history of urinary (tract) infections: Secondary | ICD-10-CM

## 2023-06-03 DIAGNOSIS — K5730 Diverticulosis of large intestine without perforation or abscess without bleeding: Secondary | ICD-10-CM | POA: Diagnosis not present

## 2023-06-03 DIAGNOSIS — I2510 Atherosclerotic heart disease of native coronary artery without angina pectoris: Secondary | ICD-10-CM | POA: Diagnosis present

## 2023-06-03 DIAGNOSIS — I517 Cardiomegaly: Secondary | ICD-10-CM | POA: Diagnosis not present

## 2023-06-03 DIAGNOSIS — R54 Age-related physical debility: Secondary | ICD-10-CM | POA: Diagnosis present

## 2023-06-03 DIAGNOSIS — N10 Acute tubulo-interstitial nephritis: Secondary | ICD-10-CM | POA: Diagnosis present

## 2023-06-03 DIAGNOSIS — Z823 Family history of stroke: Secondary | ICD-10-CM

## 2023-06-03 DIAGNOSIS — I679 Cerebrovascular disease, unspecified: Secondary | ICD-10-CM | POA: Diagnosis present

## 2023-06-03 DIAGNOSIS — Z825 Family history of asthma and other chronic lower respiratory diseases: Secondary | ICD-10-CM

## 2023-06-03 LAB — URINALYSIS, ROUTINE W REFLEX MICROSCOPIC
Bacteria, UA: NONE SEEN
Bilirubin Urine: NEGATIVE
Glucose, UA: NEGATIVE mg/dL
Ketones, ur: NEGATIVE mg/dL
Nitrite: POSITIVE — AB
Protein, ur: 30 mg/dL — AB
RBC / HPF: >50 RBC/hpf (ref 0–5)
Specific Gravity, Urine: 1.009 (ref 1.005–1.030)
WBC, UA: >50 WBC/hpf (ref 0–5)
pH: 7 (ref 5.0–8.0)

## 2023-06-03 LAB — BASIC METABOLIC PANEL
Anion gap: 7 (ref 5–15)
BUN: 22 mg/dL (ref 8–23)
CO2: 31 mmol/L (ref 22–32)
Calcium: 9.6 mg/dL (ref 8.9–10.3)
Chloride: 98 mmol/L (ref 98–111)
Creatinine, Ser: 1.08 mg/dL — ABNORMAL HIGH (ref 0.44–1.00)
GFR, Estimated: 48 mL/min — ABNORMAL LOW (ref >60)
Glucose, Bld: 134 mg/dL — ABNORMAL HIGH (ref 70–99)
Potassium: 4.1 mmol/L (ref 3.5–5.1)
Sodium: 136 mmol/L (ref 135–145)

## 2023-06-03 LAB — CBC WITH DIFFERENTIAL/PLATELET
Abs Immature Granulocytes: 0.02 10*3/uL (ref 0.00–0.07)
Basophils Absolute: 0 10*3/uL (ref 0.0–0.1)
Basophils Relative: 0 %
Eosinophils Absolute: 0.1 10*3/uL (ref 0.0–0.5)
Eosinophils Relative: 2 %
HCT: 33.7 % — ABNORMAL LOW (ref 36.0–46.0)
Hemoglobin: 11.1 g/dL — ABNORMAL LOW (ref 12.0–15.0)
Immature Granulocytes: 0 %
Lymphocytes Relative: 20 %
Lymphs Abs: 1.2 10*3/uL (ref 0.7–4.0)
MCH: 31.1 pg (ref 26.0–34.0)
MCHC: 32.9 g/dL (ref 30.0–36.0)
MCV: 94.4 fL (ref 80.0–100.0)
Monocytes Absolute: 0.6 10*3/uL (ref 0.1–1.0)
Monocytes Relative: 11 %
Neutro Abs: 4 10*3/uL (ref 1.7–7.7)
Neutrophils Relative %: 67 %
Platelets: 203 10*3/uL (ref 150–400)
RBC: 3.57 MIL/uL — ABNORMAL LOW (ref 3.87–5.11)
RDW: 14.1 % (ref 11.5–15.5)
WBC: 6.1 10*3/uL (ref 4.0–10.5)
nRBC: 0 % (ref 0.0–0.2)

## 2023-06-03 MED ORDER — ONDANSETRON HCL 4 MG/2ML IJ SOLN: 4.0000 mg | Freq: Four times a day (QID) | INTRAMUSCULAR | Status: DC | PRN

## 2023-06-03 MED ORDER — SODIUM CHLORIDE 0.9 % IV SOLN
2.0000 g | Freq: Once | INTRAVENOUS | Status: AC
  Administered 2023-06-03: 2 g via INTRAVENOUS
  Filled 2023-06-03: qty 20

## 2023-06-03 MED ORDER — ACETAMINOPHEN 650 MG RE SUPP: 650.0000 mg | Freq: Four times a day (QID) | RECTAL | Status: DC | PRN

## 2023-06-03 MED ORDER — ACETAMINOPHEN 325 MG PO TABS
650.0000 mg | ORAL_TABLET | Freq: Four times a day (QID) | ORAL | Status: DC | PRN
  Administered 2023-06-04: 650 mg via ORAL
  Filled 2023-06-03: qty 2

## 2023-06-03 MED ORDER — DEXTROSE IN LACTATED RINGERS 5 % IV SOLN: INTRAVENOUS | Status: DC

## 2023-06-03 MED ORDER — APIXABAN 2.5 MG PO TABS
2.5000 mg | ORAL_TABLET | Freq: Two times a day (BID) | ORAL | Status: DC
  Administered 2023-06-04 – 2023-06-07 (×8): 2.5 mg via ORAL
  Filled 2023-06-03 (×8): qty 1

## 2023-06-03 MED ORDER — ONDANSETRON HCL 4 MG PO TABS: 4.0000 mg | ORAL_TABLET | Freq: Four times a day (QID) | ORAL | Status: DC | PRN

## 2023-06-03 MED ORDER — SODIUM CHLORIDE 0.9 % IV SOLN: Freq: Once | INTRAVENOUS | Status: DC

## 2023-06-03 MED ORDER — ONDANSETRON HCL 4 MG/2ML IJ SOLN: 4.0000 mg | Freq: Once | INTRAMUSCULAR | Status: DC

## 2023-06-03 MED ORDER — SODIUM CHLORIDE 0.9 % IV SOLN
1.0000 g | INTRAVENOUS | Status: DC
  Administered 2023-06-04 – 2023-06-06 (×3): 1 g via INTRAVENOUS
  Filled 2023-06-03 (×4): qty 10

## 2023-06-03 NOTE — ED Triage Notes (Signed)
She reports having a chronic foley cath. (~ 2 years now). She states she was recently dx with uti and in spite of two antibiotics attempted, she is "no better". She denies fever, and is in no distress.

## 2023-06-03 NOTE — H&P (Incomplete)
History and Physical    Patient: Adrienne [name] GMW:[id number] DOB: 1928-09-01 DOA: 06/03/2023 DOS: the patient was seen and examined on 06/03/2023 PCP: Sigmund Hazel, MD  Patient coming from: Home  Chief Complaint:  Chief Complaint  Patient presents with  . Urinary Tract Infection   HPI: Adrienne [name] is a 89 y.o. female with medical history significant of Obstructive uropathy heart failure with Foley catheter nephrolithiasis, coronary artery disease, hyperlipidemia, essential hypertension, GERD, pulmonary hypertension, multiple pulmonary nodules and history of CVA who [facility] with complaint of dysuria and irritation around her Foley.  Patient has been on Macrobid for about a week after diagnosis of UTI.  No fever or chills.  Some nausea.  Patient's catheter was exchanged.  Urine analysis showed pyuria.  Patient has nonobstructing kidney stone on the left.  There was concern for possible pyelonephritis so patient is being admitted to the hospital for management of the UTI.  Review of Systems: As mentioned in the history of present illness. All other systems reviewed and are negative. Past Medical History:  Diagnosis Date  . Afib (HCC)   . Arthritis of ankle   . CAD (coronary artery disease)   . Cancer [facility]) 2012   breast cancer-right  . Compression fracture of T12 vertebra (HCC)   . Diverticulosis   . Edema   . Elevated uric acid in blood   . GERD (gastroesophageal reflux disease)   . GI bleed 12/2015  . Hyperlipidemia   . Hypertension   . Long-term (current) use of anticoagulants   . Moderate mitral regurgitation   . Personal history of radiation therapy   . Pulmonary HTN (HCC)    Echo 1/19: EF 60-65, no RWMA, Ao sclerosis, trivial AI, mild MR, mod BAE, mod TR, PASP 49  . Stroke (HCC) 01/25/2016  . TIA (transient ischemic attack) 09/2011  . Urinary incontinence   . Urinary retention    Past Surgical History:  Procedure  Laterality Date  . APPENDECTOMY    . AXILLARY SENTINEL NODE BIOPSY Left 01/22/2020   Procedure: Left Axillary Sentinel Node Biopsy;  Surgeon: Emelia Loron, MD;  Location: [facility] OR;  Service: General;  Laterality: Left;  . BREAST LUMPECTOMY WITH RADIOACTIVE SEED AND SENTINEL LYMPH NODE BIOPSY Left 01/22/2020   Procedure: LEFT BREAST LUMPECTOMY WITH RADIOACTIVE SEED;  Surgeon: Emelia Loron, MD;  Location: [facility] OR;  Service: General;  Laterality: Left;  . BREAST SURGERY  2012   Rt.mastectomy--[place], [place]  . CARDIAC CATHETERIZATION  2013  . CATARACT EXTRACTION, BILATERAL    . CERVICAL CONIZATION W/BX N/A 08/08/2020   Procedure: VAGINAL SIDEWALL BIOPSY;  Surgeon: Jerene Bears, MD;  Location: [facility] OR;  Service: Gynecology;  Laterality: N/A;  . DILATION AND CURETTAGE OF UTERUS     in her early 19's for DUB  . ESOPHAGOGASTRODUODENOSCOPY Left 01/08/2016   Procedure: ESOPHAGOGASTRODUODENOSCOPY (EGD);  Surgeon: Willis Modena, MD;  Location: [facility];  Service: Endoscopy;  Laterality: Left;  . GALLBLADDER SURGERY    . MASTECTOMY Right 2012  . RADIOLOGY WITH ANESTHESIA N/A 01/25/2016   Procedure: RADIOLOGY WITH ANESTHESIA;  Surgeon: Julieanne Cotton, MD;  Location: [facility] OR;  Service: Radiology;  Laterality: N/A;  . REPLACEMENT TOTAL KNEE BILATERAL    . TONSILLECTOMY AND ADENOIDECTOMY     as a child   Social History:  reports that she quit smoking about 74 years ago. Her smoking use included cigarettes. She started smoking about 76 years ago. She has a 0.5 pack-year  smoking history. She has never used smokeless tobacco. She reports that she does not drink alcohol and does not use drugs.  Allergies  Allergen Reactions  . Valium [Diazepam] Other (See Comments)    HYPERACTIVITY   . Amiodarone Hcl [Amiodarone] Other (See Comments)    A-Fib  . Compazine [Prochlorperazine] Other (See Comments)    HYPERACTIVITY   . Other Other (See Comments)    "Kidney dye"--patient states this gave her  severe shakes/chills  . Thorazine [Chlorpromazine] Other (See Comments)    HYPERACTIVITY   . Zometa [Zoledronic Acid] Other (See Comments)    PROBLEMS WITH EYES  TIA  . Ciprofloxacin Swelling and Other (See Comments)    "swelling" in Rt.elbow  . Doxycycline Nausea Only  . Zoster Vac Recomb Adjuvanted Other (See Comments)    Welt under injection site  . Iodinated Contrast Media Anxiety and Other (See Comments)    Chills and anxiety  Other Reaction(s): chills/shaking, Kidney dye    Family History  Problem Relation Age of Onset  . Asthma Mother   . CVA Father   . Atrial fibrillation Sister        HAS PACER  . Pneumonia Brother   . Cancer Daughter        COLON CANCER  . Cancer Maternal Grandmother        breast cancer    Prior to Admission medications   Medication Sig Start Date End Date Taking? Authorizing Provider  acetaminophen (TYLENOL) 500 MG tablet Take 1,000 mg by mouth every 12 (twelve) hours as needed for mild pain.    [provider]  albuterol (VENTOLIN HFA) 108 (90 Base) MCG/ACT inhaler Inhale 1 puff into the lungs every 6 (six) hours as needed for wheezing or shortness of breath (Cough). 09/08/22   Regalado, Belkys A, MD  allopurinol (ZYLOPRIM) 100 MG tablet Take 1 tablet (100 mg total) by mouth daily. 09/08/22   Regalado, Belkys A, MD  apixaban (ELIQUIS) 2.5 MG TABS tablet Take 1 tablet (2.5 mg total) by mouth 2 (two) times daily. 10/07/22   Jake Bathe, MD  ascorbic acid (VITAMIN C) 500 MG tablet Take 500 mg by mouth 2 (two) times daily.    [provider]  atorvastatin (LIPITOR) 10 MG tablet TAKE 1 TABLET DAILY 11/28/22   Jake Bathe, MD  B Complex Vitamins (VITAMIN B COMPLEX) CAPS Take 1 capsule by mouth daily.    [provider]  calcium carbonate (OSCAL) 1500 (600 Ca) MG TABS tablet Take 1,500 mg by mouth 2 (two) times daily with a meal.    [provider]  cyanocobalamin (VITAMIN B12) 1000 MCG tablet Take 1,000 mcg by  mouth daily.    [provider]  diltiazem (CARDIZEM) 60 MG tablet TAKE 1 TABLET THREE TIMES A DAY 11/28/22   Jake Bathe, MD  docusate sodium (COLACE) 100 MG capsule Take 100 mg by mouth 2 (two) times daily.    [provider]  Ferrous Sulfate (IRON) 325 (65 Fe) MG TABS Take 325 mg by mouth daily. (OTC)    [provider]  furosemide (LASIX) 20 MG tablet Take 2 tablets (40 mg total) by mouth daily. 05/06/23   Arrien, York Ram, MD  metoprolol tartrate (LOPRESSOR) 100 MG tablet TAKE 1 TABLET TWICE A DAY 11/28/22   Jake Bathe, MD  Multiple Vitamins-Minerals (MULTIVITAMIN WITH MINERALS) tablet Take 1 tablet by mouth daily.    [provider]  omeprazole (PRILOSEC) 10 MG capsule Take 10  mg by mouth every other day. 03/21/19   [provider]  polyethylene glycol (MIRALAX / GLYCOLAX) 17 g packet Take 17 g by mouth daily.    [provider]  Probiotic Product (UP4 PROBIOTICS WOMENS PO) Take 1 capsule by mouth daily with breakfast.    [provider]    Physical Exam: Vitals:   06/03/23 2017 06/03/23 2100 06/03/23 2200 06/03/23 2346  BP: (!) 160/70 (!) 148/76 (!) 146/64 (!) 160/74  Pulse: 62 66 69 76  Resp: 18 18 16 18   Temp:   97.6 F (36.4 C) 97.6 F (36.4 C)  TempSrc:   Oral Oral  SpO2: 94% 93% 96% 96%   Constitutional: Chronically ill looking, frail, NAD, calm, comfortable Eyes: PERRL, lids and conjunctivae normal ENMT: Mucous membranes are moist. Posterior pharynx clear of any exudate or lesions.Normal dentition.  Neck: normal, supple, no masses, no thyromegaly Respiratory: clear to auscultation bilaterally, no wheezing, no crackles. Normal respiratory effort. No accessory muscle use.  Cardiovascular: Regular rate and rhythm, no murmurs / rubs / gallops. No extremity edema. 2+ pedal pulses. No carotid bruits.  Abdomen: no tenderness, no masses palpated. No hepatosplenomegaly. Bowel sounds positive.  Musculoskeletal:  Good range of motion, no joint swelling or tenderness, Skin: no rashes, lesions, ulcers. No induration Neurologic: CN 2-12 grossly intact. Sensation intact, DTR normal. Strength 5/5 in all 4.  Psychiatric: Normal judgment and insight. Alert and oriented x 3. Normal mood  Data Reviewed:  Blood pressure 160/70, pulse 62, respirate 18 oxygen sat 94% on room air.  Assessment and Plan: No notes have been filed under this hospital service. Service: Hospitalist     Advance Care Planning:   Code Status: Prior ***  Consults: ***  Family Communication: ***  Severity of Illness: {Observation/Inpatient:21159}  AuthorLonia Blood, MD 06/03/2023 11:59 PM  For on call review [email or web address].

## 2023-06-03 NOTE — H&P (Signed)
History and Physical    Patient: Adrienne [name] FAO:[id number] DOB: [birth year] DOA: 06/03/2023 DOS: the patient was seen and examined on 06/03/2023 PCP: Sigmund Hazel, MD  Patient coming from: Home  Chief Complaint:  Chief Complaint  Patient presents with   Urinary Tract Infection   HPI: Adrienne [name] is a 89 y.o. female with medical history significant of Obstructive uropathy heart failure with Foley catheter nephrolithiasis, coronary artery disease, hyperlipidemia, essential hypertension, GERD, pulmonary hypertension, multiple pulmonary nodules and history of CVA who presented to the [facility] ER with complaint of dysuria and irritation around her Foley.  Patient has been on Macrobid for about a week after diagnosis of UTI.  No fever or chills.  Some nausea.  Patient's catheter was exchanged.  Urine analysis showed pyuria.  Patient has nonobstructing kidney stone on the left.  There was concern for possible pyelonephritis so patient is being admitted to the hospital for management of the UTI.  Review of Systems: As mentioned in the history of present illness. All other systems reviewed and are negative. Past Medical History:  Diagnosis Date   Afib (HCC)    Arthritis of ankle    CAD (coronary artery disease)    Cancer (HCC) 2012   breast cancer-right   Compression fracture of T12 vertebra (HCC)    Diverticulosis    Edema    Elevated uric acid in blood    GERD (gastroesophageal reflux disease)    GI bleed 12/2015   Hyperlipidemia    Hypertension    Long-term (current) use of anticoagulants    Moderate mitral regurgitation    Personal history of radiation therapy    Pulmonary HTN (HCC)    Echo 1/19: EF 60-65, no RWMA, Ao sclerosis, trivial AI, mild MR, mod BAE, mod TR, PASP 49   Stroke (HCC) 01/25/2016   TIA (transient ischemic attack) 09/2011   Urinary incontinence    Urinary retention    Past Surgical History:  Procedure Laterality Date    APPENDECTOMY     AXILLARY SENTINEL NODE BIOPSY Left 01/22/2020   Procedure: Left Axillary Sentinel Node Biopsy;  Surgeon: Emelia Loron, MD;  Location: [facility] OR;  Service: General;  Laterality: Left;   BREAST LUMPECTOMY WITH RADIOACTIVE SEED AND SENTINEL LYMPH NODE BIOPSY Left 01/22/2020   Procedure: LEFT BREAST LUMPECTOMY WITH RADIOACTIVE SEED;  Surgeon: Emelia Loron, MD;  Location: [facility] OR;  Service: General;  Laterality: Left;   BREAST SURGERY  2012   Rt.mastectomy--[place], [place]   CARDIAC CATHETERIZATION  2013   CATARACT EXTRACTION, BILATERAL     CERVICAL CONIZATION W/BX N/A 08/08/2020   Procedure: VAGINAL SIDEWALL BIOPSY;  Surgeon: Jerene Bears, MD;  Location: [facility] OR;  Service: Gynecology;  Laterality: N/A;   DILATION AND CURETTAGE OF UTERUS     in her early 40's for DUB   ESOPHAGOGASTRODUODENOSCOPY Left 01/08/2016   Procedure: ESOPHAGOGASTRODUODENOSCOPY (EGD);  Surgeon: Willis Modena, MD;  Location: [facility];  Service: Endoscopy;  Laterality: Left;   GALLBLADDER SURGERY     MASTECTOMY Right 2012   RADIOLOGY WITH ANESTHESIA N/A 01/25/2016   Procedure: RADIOLOGY WITH ANESTHESIA;  Surgeon: Julieanne Cotton, MD;  Location: [facility] OR;  Service: Radiology;  Laterality: N/A;   REPLACEMENT TOTAL KNEE BILATERAL     TONSILLECTOMY AND ADENOIDECTOMY     as a child   Social History:  reports that she quit smoking about 74 years ago. Her smoking use included cigarettes. She started smoking about 76 years ago. She has a 0.5 pack-year  smoking history. She has never used smokeless tobacco. She reports that she does not drink alcohol and does not use drugs.  Allergies  Allergen Reactions   Valium [Diazepam] Other (See Comments)    HYPERACTIVITY    Amiodarone Hcl [Amiodarone] Other (See Comments)    A-Fib   Compazine [Prochlorperazine] Other (See Comments)    HYPERACTIVITY    Other Other (See Comments)    "Kidney dye"--patient states this gave her severe shakes/chills   Thorazine  [Chlorpromazine] Other (See Comments)    HYPERACTIVITY    Zometa [Zoledronic Acid] Other (See Comments)    PROBLEMS WITH EYES  TIA   Ciprofloxacin Swelling and Other (See Comments)    "swelling" in Rt.elbow   Doxycycline Nausea Only   Zoster Vac Recomb Adjuvanted Other (See Comments)    Welt under injection site   Iodinated Contrast Media Anxiety and Other (See Comments)    Chills and anxiety  Other Reaction(s): chills/shaking, Kidney dye    Family History  Problem Relation Age of Onset   Asthma Mother    CVA Father    Atrial fibrillation Sister        HAS PACER   Pneumonia Brother    Cancer Daughter        COLON CANCER   Cancer Maternal Grandmother        breast cancer    Prior to Admission medications   Medication Sig Start Date End Date Taking? Authorizing Provider  acetaminophen (TYLENOL) 500 MG tablet Take 1,000 mg by mouth every 12 (twelve) hours as needed for mild pain.    [provider]  albuterol (VENTOLIN HFA) 108 (90 Base) MCG/ACT inhaler Inhale 1 puff into the lungs every 6 (six) hours as needed for wheezing or shortness of breath (Cough). 09/08/22   Regalado, Belkys A, MD  allopurinol (ZYLOPRIM) 100 MG tablet Take 1 tablet (100 mg total) by mouth daily. 09/08/22   Regalado, Belkys A, MD  apixaban (ELIQUIS) 2.5 MG TABS tablet Take 1 tablet (2.5 mg total) by mouth 2 (two) times daily. 10/07/22   Jake Bathe, MD  ascorbic acid (VITAMIN C) 500 MG tablet Take 500 mg by mouth 2 (two) times daily.    [provider]  atorvastatin (LIPITOR) 10 MG tablet TAKE 1 TABLET DAILY 11/28/22   Jake Bathe, MD  B Complex Vitamins (VITAMIN B COMPLEX) CAPS Take 1 capsule by mouth daily.    [provider]  calcium carbonate (OSCAL) 1500 (600 Ca) MG TABS tablet Take 1,500 mg by mouth 2 (two) times daily with a meal.    [provider]  cyanocobalamin (VITAMIN B12) 1000 MCG tablet Take 1,000 mcg by mouth daily.    [provider]   diltiazem (CARDIZEM) 60 MG tablet TAKE 1 TABLET THREE TIMES A DAY 11/28/22   Jake Bathe, MD  docusate sodium (COLACE) 100 MG capsule Take 100 mg by mouth 2 (two) times daily.    [provider]  Ferrous Sulfate (IRON) 325 (65 Fe) MG TABS Take 325 mg by mouth daily. (OTC)    [provider]  furosemide (LASIX) 20 MG tablet Take 2 tablets (40 mg total) by mouth daily. 05/06/23   Arrien, York Ram, MD  metoprolol tartrate (LOPRESSOR) 100 MG tablet TAKE 1 TABLET TWICE A DAY 11/28/22   Jake Bathe, MD  Multiple Vitamins-Minerals (MULTIVITAMIN WITH MINERALS) tablet Take 1 tablet by mouth daily.    [provider]  omeprazole (PRILOSEC) 10 MG capsule Take 10  mg by mouth every other day. 03/21/19   [provider]  polyethylene glycol (MIRALAX / GLYCOLAX) 17 g packet Take 17 g by mouth daily.    [provider]  Probiotic Product (UP4 PROBIOTICS WOMENS PO) Take 1 capsule by mouth daily with breakfast.    [provider]    Physical Exam: Vitals:   06/03/23 2017 06/03/23 2100 06/03/23 2200 06/03/23 2346  BP: (!) 160/70 (!) 148/76 (!) 146/64 (!) 160/74  Pulse: 62 66 69 76  Resp: 18 18 16 18   Temp:   97.6 F (36.4 C) 97.6 F (36.4 C)  TempSrc:   Oral Oral  SpO2: 94% 93% 96% 96%   Constitutional: Chronically ill looking, frail, NAD, calm, comfortable Eyes: PERRL, lids and conjunctivae normal ENMT: Mucous membranes are moist. Posterior pharynx clear of any exudate or lesions.Normal dentition.  Neck: normal, supple, no masses, no thyromegaly Respiratory: clear to auscultation bilaterally, no wheezing, no crackles. Normal respiratory effort. No accessory muscle use.  Cardiovascular: Regular rate and rhythm, no murmurs / rubs / gallops. No extremity edema. 2+ pedal pulses. No carotid bruits.  Abdomen: no tenderness, no masses palpated. No hepatosplenomegaly. Bowel sounds positive.  Musculoskeletal: Good range of motion, no joint swelling  or tenderness, Skin: no rashes, lesions, ulcers. No induration Neurologic: CN 2-12 grossly intact. Sensation intact, DTR normal. Strength 5/5 in all 4.  Psychiatric: Normal judgment and insight. Alert and oriented x 3. Normal mood  Data Reviewed:  Blood pressure 160/70, pulse 62, respirate 18 oxygen sat 94% on room air. Urinalysis showed large hemoglobin.  Large leukocytes.  Nitrite positive.  WBC more than 50.  CT renal stone showed severe pelvic floor laxity with anterior and posterior compartment prolapse.  A 2 mm nonobstructing left renal calculus and chronic diverticulosis  Assessment and Plan:  #1 UTI: Due to patient's age and fear for acute pyelonephritis, patient being admitted.  Initiate IV antibiotics.  IV hydration.  Manage  fever.  #2 Heart failure with preserved ejection fraction: Continue with home regimen  #3 pulmonary hypertension: Stable at baseline.  Continue to monitor  #4 history of CVA: Patient has residual symptoms.  Continue to monitor.  #5 paroxysmal atrial fibrillation: Patient is on chronic anticoagulation.  Continue home regimen  #6 GERD: Continue with PPIs    Advance Care Planning:   Code Status: Prior DNR  Consults: None  Family Communication: No family at bedside  Severity of Illness: The appropriate patient status for this patient is INPATIENT. Inpatient status is judged to be reasonable and necessary in order to provide the required intensity of service to ensure the patient's safety. The patient's presenting symptoms, physical exam findings, and initial radiographic and laboratory data in the context of their chronic comorbidities is felt to place them at high risk for further clinical deterioration. Furthermore, it is not anticipated that the patient will be medically stable for discharge from the hospital within 2 midnights of admission.   * I certify that at the point of admission it is my clinical judgment that the patient will require inpatient  hospital care spanning beyond 2 midnights from the point of admission due to high intensity of service, high risk for further deterioration and high frequency of surveillance required.*  AuthorLonia Blood, MD 06/03/2023 11:59 PM  For on call review [email or web address].

## 2023-06-03 NOTE — ED Notes (Signed)
[facility] here to transport patient to [facility].

## 2023-06-03 NOTE — ED Provider Notes (Signed)
[place] EMERGENCY DEPARTMENT AT [facility] Provider Note   CSN: [id number] Arrival date & time: 06/03/23  1659     History  Chief Complaint  Patient presents with   Urinary Tract Infection    Adrienne [name] is a 89 y.o. female.  Patient here with ongoing discomfort with urine and irritation around her Foley.  Patient has chronic Foley catheter.  She been on Macrobid for about a week with no change in symptoms.  Denies any fevers or chills but not having flank pain nausea.  Nothing makes it worse or better.  Denies any fever or chills.  Denies any chest pain shortness of breath.  No history of kidney stones.  The history is provided by the patient.       Home Medications Prior to Admission medications   Medication Sig Start Date End Date Taking? Authorizing Provider  acetaminophen (TYLENOL) 500 MG tablet Take 1,000 mg by mouth every 12 (twelve) hours as needed for mild pain.    [provider]  albuterol (VENTOLIN HFA) 108 (90 Base) MCG/ACT inhaler Inhale 1 puff into the lungs every 6 (six) hours as needed for wheezing or shortness of breath (Cough). 09/08/22   Regalado, Belkys A, MD  allopurinol (ZYLOPRIM) 100 MG tablet Take 1 tablet (100 mg total) by mouth daily. 09/08/22   Regalado, Belkys A, MD  apixaban (ELIQUIS) 2.5 MG TABS tablet Take 1 tablet (2.5 mg total) by mouth 2 (two) times daily. 10/07/22   Jake Bathe, MD  ascorbic acid (VITAMIN C) 500 MG tablet Take 500 mg by mouth 2 (two) times daily.    [provider]  atorvastatin (LIPITOR) 10 MG tablet TAKE 1 TABLET DAILY 11/28/22   Jake Bathe, MD  B Complex Vitamins (VITAMIN B COMPLEX) CAPS Take 1 capsule by mouth daily.    [provider]  calcium carbonate (OSCAL) 1500 (600 Ca) MG TABS tablet Take 1,500 mg by mouth 2 (two) times daily with a meal.    [provider]  cyanocobalamin (VITAMIN B12) 1000 MCG tablet Take 1,000 mcg by mouth daily.    [provider]  diltiazem (CARDIZEM) 60 MG tablet TAKE 1 TABLET THREE TIMES A DAY 11/28/22   Jake Bathe, MD  docusate sodium (COLACE) 100 MG capsule Take 100 mg by mouth 2 (two) times daily.    [provider]  Ferrous Sulfate (IRON) 325 (65 Fe) MG TABS Take 325 mg by mouth daily. (OTC)    [provider]  furosemide (LASIX) 20 MG tablet Take 2 tablets (40 mg total) by mouth daily. 05/06/23   Arrien, York Ram, MD  metoprolol tartrate (LOPRESSOR) 100 MG tablet TAKE 1 TABLET TWICE A DAY 11/28/22   Jake Bathe, MD  Multiple Vitamins-Minerals (MULTIVITAMIN WITH MINERALS) tablet Take 1 tablet by mouth daily.    [provider]  omeprazole (PRILOSEC) 10 MG capsule Take 10 mg by mouth every other day. 03/21/19   [provider]  polyethylene glycol (MIRALAX / GLYCOLAX) 17 g packet Take 17 g by mouth daily.    [provider]  Probiotic Product (UP4 PROBIOTICS WOMENS PO) Take 1 capsule by mouth daily with breakfast.    [provider]      Allergies    Valium [diazepam], Amiodarone hcl [amiodarone], Compazine [prochlorperazine], Other, Thorazine [chlorpromazine], Zometa [zoledronic acid], Ciprofloxacin, Doxycycline, Zoster vac recomb adjuvanted, and Iodinated contrast media    Review of Systems   Review of Systems  Physical  Exam Updated Vital Signs BP (!) 160/70   Pulse 62   Temp 98.6 F (37 C) (Oral)   Resp 18   LMP 09/12/1973 (Approximate)   SpO2 94%  Physical Exam Vitals and nursing note reviewed.  Constitutional:      General: She is not in acute distress.    Appearance: She is well-developed. She is not ill-appearing.  HENT:     Head: Normocephalic and atraumatic.     Nose: Nose normal.     Mouth/Throat:     Mouth: Mucous membranes are moist.  Eyes:     Extraocular Movements: Extraocular movements intact.     Conjunctiva/sclera: Conjunctivae normal.     Pupils: Pupils are equal, round, and reactive to light.   Cardiovascular:     Rate and Rhythm: Normal rate and regular rhythm.     Pulses: Normal pulses.     Heart sounds: Normal heart sounds. No murmur heard. Pulmonary:     Effort: Pulmonary effort is normal. No respiratory distress.     Breath sounds: Normal breath sounds.  Abdominal:     Palpations: Abdomen is soft.     Tenderness: There is no abdominal tenderness. There is right CVA tenderness.  Musculoskeletal:        General: No swelling.     Cervical back: Normal range of motion and neck supple.  Skin:    General: Skin is warm and dry.     Capillary Refill: Capillary refill takes less than 2 seconds.  Neurological:     General: No focal deficit present.     Mental Status: She is alert and oriented to person, place, and time.     Cranial Nerves: No cranial nerve deficit.     Sensory: No sensory deficit.     Motor: No weakness.     Coordination: Coordination normal.  Psychiatric:        Mood and Affect: Mood normal.     ED Results / Procedures / Treatments   Labs (all labs ordered are listed, but only abnormal results are displayed) Labs Reviewed  URINALYSIS, ROUTINE W REFLEX MICROSCOPIC - Abnormal; Notable for the following components:      Result Value   APPearance HAZY (*)    Hgb urine dipstick LARGE (*)    Protein, ur 30 (*)    Nitrite POSITIVE (*)    Leukocytes,Ua LARGE (*)    All other components within normal limits  CBC WITH DIFFERENTIAL/PLATELET - Abnormal; Notable for the following components:   RBC 3.57 (*)    Hemoglobin 11.1 (*)    HCT 33.7 (*)    All other components within normal limits  BASIC METABOLIC PANEL - Abnormal; Notable for the following components:   Glucose, Bld 134 (*)    Creatinine, Ser 1.08 (*)    GFR, Estimated 48 (*)    All other components within normal limits  URINE CULTURE    EKG None  Radiology CT Renal Stone Study  Result Date: 06/03/2023 CLINICAL DATA:  Recent diagnosis of UTI with continued complications. EXAM: CT ABDOMEN  AND PELVIS WITHOUT CONTRAST TECHNIQUE: Multidetector CT imaging of the abdomen and pelvis was performed following the standard protocol without IV contrast. RADIATION DOSE REDUCTION: This exam was performed according to the departmental dose-optimization program which includes automated exposure control, adjustment of the mA and/or kV according to patient size and/or use of iterative reconstruction technique. COMPARISON:  March 23, 2023 and December 25, 2015 FINDINGS: Lower chest: There are small, stable bilateral pleural  effusions. Hepatobiliary: No focal liver abnormality is seen. Status post cholecystectomy. No biliary dilatation. Pancreas: Unremarkable. No pancreatic ductal dilatation or surrounding inflammatory changes. Spleen: Normal in size without focal abnormality. Adrenals/Urinary Tract: Adrenal glands are unremarkable. Kidneys are normal in size, without focal lesions. A 2 mm nonobstructing renal calculus is seen within the mid left kidney. There is stable marked severity left-sided hydronephrosis and hydroureter which extends into an area of posterior pelvic floor prolapse. The urinary bladder is poorly distended and subsequently limited in evaluation. A Foley catheter is in place. Stomach/Bowel: Stomach is within normal limits. The appendix is surgically absent. No evidence of bowel wall thickening, distention, or inflammatory changes. Numerous noninflamed diverticula are seen throughout the large bowel. Persistent chronic twisting of the mesentery is seen involving the mid to lower abdomen, to the left of midline (axial CT images 26 through 40, CT series 2). No associated abnormal bowel loops are identified. Vascular/Lymphatic: Aortic atherosclerosis. No enlarged abdominal or pelvic lymph nodes. Reproductive: Status post hysterectomy. No adnexal masses. Other: No abdominal wall hernia. There is stable, marked severity laxity of the pelvic floor with subsequent posterior compartment descent and prolapse of  the rectum and anorectal junction. No abdominopelvic ascites. Musculoskeletal: Marked severity multilevel degenerative changes are seen. IMPRESSION: 1. Stable marked severity pelvic floor laxity with anterior and posterior compartment prolapse and subsequent partial obstruction of the left kidney secondary to mass effect on the distal left ureter. 2. 2 mm nonobstructing left renal calculus. 3. Colonic diverticulosis. 4. Small, stable bilateral pleural effusions. 5. Evidence of prior cholecystectomy, appendectomy and hysterectomy. 6. Aortic atherosclerosis. Aortic Atherosclerosis (ICD10-I70.0). Electronically Signed   By: Aram Candela M.D.   On: 06/03/2023 19:50    Procedures Procedures    Medications Ordered in ED Medications  ondansetron (ZOFRAN) injection 4 mg (has no administration in time range)  0.9 %  sodium chloride infusion (has no administration in time range)  cefTRIAXone (ROCEPHIN) 2 g in sodium chloride 0.9 % 100 mL IVPB (2 g Intravenous New Bag/Given 06/03/23 1932)    ED Course/ Medical Decision Making/ A&P                                 Medical Decision Making Amount and/or Complexity of Data Reviewed Labs: ordered. Radiology: ordered.  Risk Prescription drug management. Decision regarding hospitalization.   Adrienne [name] is here with concern for ongoing UTI despite being on antibiotics he has a chronic indwelling Foley catheter.  Normal vitals.  No fever.  Differential diagnosis no concern for pyelonephritis given right flank pain, nausea.  Seems less likely be kidney stone.  Suspect ongoing urinary tract infection.  She has not had Foley catheter change since diagnosed with UTI.  Foley catheter was changed.  Infectious workup pursued including CT scan abdomen pelvis.  Urinalysis still with signs of infection.  CT scan however no shows sign of pyelonephritis or kidney stone.  Lab work otherwise unremarkable.  No significant leukocytosis or anemia or  electrolyte abnormality.  She is overall nauseous and uncomfortable.  Will give some Zofran.  Will give IV antibiotics and admit her for observation for further symptomatic support and hopefully speciation of urinalysis.  I do think she could be developing a pyelonephritis given that she is having some right flank pain.  She declined pain medicine at this time.  Admitted to medicine in stable condition.  This chart was dictated using voice recognition software.  Despite best efforts to proofread,  errors can occur which can change the documentation meaning.         Final Clinical Impression(s) / ED Diagnoses Final diagnoses:  Urinary tract infection associated with indwelling urethral catheter, initial encounter [facility])    Rx / DC Orders ED Discharge Orders     None         Virgina Norfolk, DO 06/03/23 2102

## 2023-06-04 ENCOUNTER — Other Ambulatory Visit: Payer: Self-pay

## 2023-06-04 DIAGNOSIS — T83511A Infection and inflammatory reaction due to indwelling urethral catheter, initial encounter: Secondary | ICD-10-CM | POA: Diagnosis present

## 2023-06-04 DIAGNOSIS — I272 Pulmonary hypertension, unspecified: Secondary | ICD-10-CM | POA: Diagnosis present

## 2023-06-04 DIAGNOSIS — D6869 Other thrombophilia: Secondary | ICD-10-CM | POA: Diagnosis present

## 2023-06-04 DIAGNOSIS — Z66 Do not resuscitate: Secondary | ICD-10-CM | POA: Diagnosis present

## 2023-06-04 DIAGNOSIS — Z7901 Long term (current) use of anticoagulants: Secondary | ICD-10-CM | POA: Diagnosis not present

## 2023-06-04 DIAGNOSIS — I130 Hypertensive heart and chronic kidney disease with heart failure and stage 1 through stage 4 chronic kidney disease, or unspecified chronic kidney disease: Secondary | ICD-10-CM | POA: Diagnosis present

## 2023-06-04 DIAGNOSIS — R64 Cachexia: Secondary | ICD-10-CM | POA: Diagnosis present

## 2023-06-04 DIAGNOSIS — N390 Urinary tract infection, site not specified: Secondary | ICD-10-CM | POA: Diagnosis present

## 2023-06-04 DIAGNOSIS — Y846 Urinary catheterization as the cause of abnormal reaction of the patient, or of later complication, without mention of misadventure at the time of the procedure: Secondary | ICD-10-CM | POA: Diagnosis present

## 2023-06-04 DIAGNOSIS — R918 Other nonspecific abnormal finding of lung field: Secondary | ICD-10-CM | POA: Diagnosis present

## 2023-06-04 DIAGNOSIS — D631 Anemia in chronic kidney disease: Secondary | ICD-10-CM | POA: Diagnosis present

## 2023-06-04 DIAGNOSIS — N3001 Acute cystitis with hematuria: Secondary | ICD-10-CM | POA: Diagnosis not present

## 2023-06-04 DIAGNOSIS — Z923 Personal history of irradiation: Secondary | ICD-10-CM | POA: Diagnosis not present

## 2023-06-04 DIAGNOSIS — Z87891 Personal history of nicotine dependence: Secondary | ICD-10-CM | POA: Diagnosis not present

## 2023-06-04 DIAGNOSIS — Z9011 Acquired absence of right breast and nipple: Secondary | ICD-10-CM | POA: Diagnosis not present

## 2023-06-04 DIAGNOSIS — N200 Calculus of kidney: Secondary | ICD-10-CM | POA: Diagnosis present

## 2023-06-04 DIAGNOSIS — I340 Nonrheumatic mitral (valve) insufficiency: Secondary | ICD-10-CM | POA: Diagnosis present

## 2023-06-04 DIAGNOSIS — Z96653 Presence of artificial knee joint, bilateral: Secondary | ICD-10-CM | POA: Diagnosis present

## 2023-06-04 DIAGNOSIS — N10 Acute tubulo-interstitial nephritis: Secondary | ICD-10-CM | POA: Diagnosis present

## 2023-06-04 DIAGNOSIS — E785 Hyperlipidemia, unspecified: Secondary | ICD-10-CM | POA: Diagnosis present

## 2023-06-04 DIAGNOSIS — I2510 Atherosclerotic heart disease of native coronary artery without angina pectoris: Secondary | ICD-10-CM | POA: Diagnosis present

## 2023-06-04 DIAGNOSIS — I517 Cardiomegaly: Secondary | ICD-10-CM | POA: Diagnosis not present

## 2023-06-04 DIAGNOSIS — I509 Heart failure, unspecified: Secondary | ICD-10-CM | POA: Diagnosis not present

## 2023-06-04 DIAGNOSIS — Z8673 Personal history of transient ischemic attack (TIA), and cerebral infarction without residual deficits: Secondary | ICD-10-CM | POA: Diagnosis not present

## 2023-06-04 DIAGNOSIS — I5033 Acute on chronic diastolic (congestive) heart failure: Secondary | ICD-10-CM | POA: Diagnosis present

## 2023-06-04 DIAGNOSIS — I4821 Permanent atrial fibrillation: Secondary | ICD-10-CM | POA: Diagnosis present

## 2023-06-04 DIAGNOSIS — I700 Atherosclerosis of aorta: Secondary | ICD-10-CM | POA: Diagnosis not present

## 2023-06-04 DIAGNOSIS — Z79899 Other long term (current) drug therapy: Secondary | ICD-10-CM | POA: Diagnosis not present

## 2023-06-04 DIAGNOSIS — K219 Gastro-esophageal reflux disease without esophagitis: Secondary | ICD-10-CM | POA: Diagnosis present

## 2023-06-04 DIAGNOSIS — N1832 Chronic kidney disease, stage 3b: Secondary | ICD-10-CM | POA: Diagnosis present

## 2023-06-04 LAB — COMPREHENSIVE METABOLIC PANEL
ALT: 10 U/L (ref 0–44)
AST: 14 U/L — ABNORMAL LOW (ref 15–41)
Albumin: 2.9 g/dL — ABNORMAL LOW (ref 3.5–5.0)
Alkaline Phosphatase: 56 U/L (ref 38–126)
Anion gap: 10 (ref 5–15)
BUN: 19 mg/dL (ref 8–23)
CO2: 28 mmol/L (ref 22–32)
Calcium: 9 mg/dL (ref 8.9–10.3)
Chloride: 103 mmol/L (ref 98–111)
Creatinine, Ser: 0.91 mg/dL (ref 0.44–1.00)
GFR, Estimated: 59 mL/min — ABNORMAL LOW (ref >60)
Glucose, Bld: 111 mg/dL — ABNORMAL HIGH (ref 70–99)
Potassium: 3.5 mmol/L (ref 3.5–5.1)
Sodium: 141 mmol/L (ref 135–145)
Total Bilirubin: 0.6 mg/dL (ref 0.3–1.2)
Total Protein: 6.1 g/dL — ABNORMAL LOW (ref 6.5–8.1)

## 2023-06-04 LAB — CBC
HCT: 31.2 % — ABNORMAL LOW (ref 36.0–46.0)
Hemoglobin: 10.1 g/dL — ABNORMAL LOW (ref 12.0–15.0)
MCH: 30.9 pg (ref 26.0–34.0)
MCHC: 32.4 g/dL (ref 30.0–36.0)
MCV: 95.4 fL (ref 80.0–100.0)
Platelets: 190 10*3/uL (ref 150–400)
RBC: 3.27 MIL/uL — ABNORMAL LOW (ref 3.87–5.11)
RDW: 14.1 % (ref 11.5–15.5)
WBC: 7.4 10*3/uL (ref 4.0–10.5)
nRBC: 0 % (ref 0.0–0.2)

## 2023-06-04 LAB — URINE CULTURE

## 2023-06-04 MED ORDER — MULTI-VITAMIN/MINERALS PO TABS: 1.0000 | ORAL_TABLET | Freq: Every day | ORAL | Status: DC

## 2023-06-04 MED ORDER — PANTOPRAZOLE SODIUM 40 MG PO TBEC
40.0000 mg | DELAYED_RELEASE_TABLET | Freq: Every day | ORAL | Status: DC
  Administered 2023-06-04 – 2023-06-07 (×4): 40 mg via ORAL
  Filled 2023-06-04 (×4): qty 1

## 2023-06-04 MED ORDER — CHLORHEXIDINE GLUCONATE CLOTH 2 % EX PADS
6.0000 | MEDICATED_PAD | Freq: Every day | CUTANEOUS | Status: DC
  Administered 2023-06-04 – 2023-06-07 (×4): 6 via TOPICAL

## 2023-06-04 MED ORDER — IRON 325 (65 FE) MG PO TABS: 325.0000 mg | ORAL_TABLET | Freq: Every day | ORAL | Status: DC

## 2023-06-04 MED ORDER — FERROUS SULFATE 325 (65 FE) MG PO TABS
325.0000 mg | ORAL_TABLET | Freq: Every day | ORAL | Status: DC
  Administered 2023-06-05 – 2023-06-07 (×3): 325 mg via ORAL
  Filled 2023-06-04 (×3): qty 1

## 2023-06-04 MED ORDER — DILTIAZEM HCL 30 MG PO TABS
60.0000 mg | ORAL_TABLET | Freq: Three times a day (TID) | ORAL | Status: DC
  Administered 2023-06-04 – 2023-06-07 (×9): 60 mg via ORAL
  Filled 2023-06-04 (×9): qty 2

## 2023-06-04 MED ORDER — ALLOPURINOL 100 MG PO TABS
100.0000 mg | ORAL_TABLET | Freq: Every day | ORAL | Status: DC
  Administered 2023-06-04 – 2023-06-07 (×4): 100 mg via ORAL
  Filled 2023-06-04 (×4): qty 1

## 2023-06-04 MED ORDER — CALCIUM CARBONATE 1250 (500 CA) MG PO TABS
1250.0000 mg | ORAL_TABLET | Freq: Two times a day (BID) | ORAL | Status: DC
  Administered 2023-06-04 – 2023-06-07 (×6): 1250 mg via ORAL
  Filled 2023-06-04 (×6): qty 1

## 2023-06-04 MED ORDER — METOPROLOL TARTRATE 50 MG PO TABS
100.0000 mg | ORAL_TABLET | Freq: Two times a day (BID) | ORAL | Status: DC
  Administered 2023-06-04 – 2023-06-07 (×7): 100 mg via ORAL
  Filled 2023-06-04 (×7): qty 2

## 2023-06-04 MED ORDER — ATORVASTATIN CALCIUM 10 MG PO TABS
10.0000 mg | ORAL_TABLET | Freq: Every day | ORAL | Status: DC
  Administered 2023-06-04 – 2023-06-07 (×4): 10 mg via ORAL
  Filled 2023-06-04 (×4): qty 1

## 2023-06-04 MED ORDER — VITAMIN C 500 MG PO TABS
500.0000 mg | ORAL_TABLET | Freq: Two times a day (BID) | ORAL | Status: DC
  Administered 2023-06-04 – 2023-06-07 (×6): 500 mg via ORAL
  Filled 2023-06-04 (×6): qty 1

## 2023-06-04 MED ORDER — CALCIUM CARBONATE 1500 (600 CA) MG PO TABS: 1500.0000 mg | ORAL_TABLET | Freq: Two times a day (BID) | ORAL | Status: DC

## 2023-06-04 MED ORDER — FUROSEMIDE 40 MG PO TABS
40.0000 mg | ORAL_TABLET | Freq: Every day | ORAL | Status: DC
  Administered 2023-06-04 – 2023-06-07 (×2): 40 mg via ORAL
  Filled 2023-06-04 (×4): qty 1

## 2023-06-04 MED ORDER — ADULT MULTIVITAMIN W/MINERALS CH
1.0000 | ORAL_TABLET | Freq: Every day | ORAL | Status: DC
  Administered 2023-06-04 – 2023-06-07 (×4): 1 via ORAL
  Filled 2023-06-04 (×4): qty 1

## 2023-06-04 MED ORDER — VITAMIN B-12 1000 MCG PO TABS
1000.0000 ug | ORAL_TABLET | Freq: Every day | ORAL | Status: DC
  Administered 2023-06-04 – 2023-06-07 (×4): 1000 ug via ORAL
  Filled 2023-06-04 (×4): qty 1

## 2023-06-04 MED ORDER — POLYETHYLENE GLYCOL 3350 17 G PO PACK
17.0000 g | PACK | Freq: Every day | ORAL | Status: DC
  Administered 2023-06-04 – 2023-06-07 (×3): 17 g via ORAL
  Filled 2023-06-04 (×4): qty 1

## 2023-06-04 MED ORDER — HYDROXYZINE HCL 25 MG PO TABS
25.0000 mg | ORAL_TABLET | Freq: Once | ORAL | Status: AC
  Administered 2023-06-04: 25 mg via ORAL
  Filled 2023-06-04: qty 1

## 2023-06-04 NOTE — Plan of Care (Signed)

## 2023-06-04 NOTE — Progress Notes (Signed)
PROGRESS NOTE   Adrienne [name]  ZOX:[id number]    DOB: Dec 18, 1927    DOA: 06/03/2023  PCP: Sigmund Hazel, MD   I have briefly reviewed patients previous medical records in [facility]  Chief Complaint  Patient presents with   Urinary Tract Infection    Brief Narrative:  89 year old married female, ambulates with the help of a walker, medical history significant for chronic Foley dependent urinary retention, atrial fibrillation on Eliquis, CAD, GERD, HLD, HTN, pulmonary hypertension, TIA/stroke who presented to ED 06/03/2023 with complaints of dysuria, pain around catheter site and bladder spasms which are typical of her prior UTIs.  Admitted for complicated UTI.  Assessment & Plan:  Principal Problem:   UTI (urinary tract infection) Active Problems:   Permanent atrial fibrillation (HCC)   Pulmonary HTN (HCC)   Gait disturbance, post-stroke   GERD (gastroesophageal reflux disease)   Multiple pulmonary nodules   Cerebrovascular disease   Complicated UTI/CAUTI History of chronic Foley dependent urinary retention.  Has had Foley catheter for approximately 2 years per her report Foley catheter was last changed PTA on 05/22/2023 and again changed in ED on admission 06/03/2023 Follows with Dr. Sherron Monday, Urology CT renal stone study 9/21: Stable marked severity pelvic floor laxity with anterior and posterior compartment prolapse and subsequent partial obstruction of the left kidney secondary to mass effect on the distal left ureter.  2 mm nonobstructing left renal calculus. Urine microscopy nitrites +, >50 RBC/hpf, >50 WBC per hpf. Currently on IV ceftriaxone pending urine culture results However urine culture from 8/22 showed MRSA, 10K colonies per mL.  May need MRSA coverage. DC IV fluids.  Chronic urinary retention: Foley catheter changed in ED 9/21.  Paroxysmal atrial fibrillation: Rate controlled on telemetry this morning Resume home dose of Cardizem and  metoprolol. Continue Eliquis anticoagulation for secondary hypercoagulable state  Hypertension Uncontrolled, has not received her home med yet Resumed home dose of metoprolol and Cardizem.  Monitor closely  Hyperlipidemia Continue home dose of atorvastatin  Chronic diastolic CHF Clinically euvolemic.  Discontinue IV fluids.  Resume home dose of Lasix 40 mg daily.  GERD Continue PPI  History of TIA/CVA Continue apixaban anticoagulation.  Chronic ambulatory dysfunction Ambulates with the help of a walker. PT evaluation.  Anemia May be dilutional.  Follow CBC in a.m.   There is no height or weight on file to calculate BMI.   DVT prophylaxis: apixaban (ELIQUIS) tablet 2.5 mg Start: 06/04/23 0045     Code Status: Do not attempt resuscitation (DNR) PRE-ARREST INTERVENTIONS DESIRED:  Family Communication: None at bedside Disposition:  Status is: Observation The patient will require care spanning > 2 midnights and should be moved to inpatient because: IV antibiotics for complicated UTI     Consultants:   None  Procedures:     Antimicrobials:   As above.   Subjective:  History as noted above.  Patient interviewed and examined along with her female RN in the room.  Reports that the burning sensation that she had around her Foley catheter and the abdominal/bladder spasms have decreased.  No other complaints reported  Objective:   Vitals:   06/03/23 2346 06/04/23 0416 06/04/23 0738 06/04/23 1152  BP: (!) 160/74 (!) 150/70 (!) 159/68 (!) 189/88  Pulse: 76 81 81 (!) 104  Resp: 18 18 15    Temp: 97.6 F (36.4 C) 98.2 F (36.8 C) 98.8 F (37.1 C) 98.3 F (36.8 C)  TempSrc: Oral Oral Oral Oral  SpO2: 96% 92% 94% 94%  General exam: Elderly female, moderately built and frail, lying comfortably propped up in bed without distress.  Oral mucosa moist. Respiratory system: Clear to auscultation. Respiratory effort normal. Cardiovascular system: S1 & S2 heard,  irregularly irregular. No JVD, murmurs, rubs, gallops or clicks. No pedal edema.  Telemetry personally reviewed: A-fib with controlled ventricular rate, BBB morphology. Gastrointestinal system: Abdomen is nondistended, soft and nontender. No organomegaly or masses felt. Normal bowel sounds heard. GU: Foley catheter in place.  Clear straw-colored urine in bag. Central nervous system: Alert and oriented. No focal neurological deficits. Extremities: Symmetric 5 x 5 power. Skin: No rashes, lesions or ulcers Psychiatry: Judgement and insight appear normal. Mood & affect appropriate.     Data Reviewed:   I have personally reviewed following labs and imaging studies   CBC: Recent Labs  Lab 06/03/23 1807 06/04/23 0716  WBC 6.1 7.4  NEUTROABS 4.0  --   HGB 11.1* 10.1*  HCT 33.7* 31.2*  MCV 94.4 95.4  PLT 203 190    Basic Metabolic Panel: Recent Labs  Lab 06/03/23 1807 06/04/23 0716  NA 136 141  K 4.1 3.5  CL 98 103  CO2 31 28  GLUCOSE 134* 111*  BUN 22 19  CREATININE 1.08* 0.91  CALCIUM 9.6 9.0    Liver Function Tests: Recent Labs  Lab 06/04/23 0716  AST 14*  ALT 10  ALKPHOS 56  BILITOT 0.6  PROT 6.1*  ALBUMIN 2.9*    CBG: No results for input(s): "GLUCAP" in the last 168 hours.  Microbiology Studies:  No results found for this or any previous visit (from the past 240 hour(s)).  Radiology Studies:  CT Renal Stone Study  Result Date: 06/03/2023 CLINICAL DATA:  Recent diagnosis of UTI with continued complications. EXAM: CT ABDOMEN AND PELVIS WITHOUT CONTRAST TECHNIQUE: Multidetector CT imaging of the abdomen and pelvis was performed following the standard protocol without IV contrast. RADIATION DOSE REDUCTION: This exam was performed according to the departmental dose-optimization program which includes automated exposure control, adjustment of the mA and/or kV according to patient size and/or use of iterative reconstruction technique. COMPARISON:  March 23, 2023  and December 25, 2015 FINDINGS: Lower chest: There are small, stable bilateral pleural effusions. Hepatobiliary: No focal liver abnormality is seen. Status post cholecystectomy. No biliary dilatation. Pancreas: Unremarkable. No pancreatic ductal dilatation or surrounding inflammatory changes. Spleen: Normal in size without focal abnormality. Adrenals/Urinary Tract: Adrenal glands are unremarkable. Kidneys are normal in size, without focal lesions. A 2 mm nonobstructing renal calculus is seen within the mid left kidney. There is stable marked severity left-sided hydronephrosis and hydroureter which extends into an area of posterior pelvic floor prolapse. The urinary bladder is poorly distended and subsequently limited in evaluation. A Foley catheter is in place. Stomach/Bowel: Stomach is within normal limits. The appendix is surgically absent. No evidence of bowel wall thickening, distention, or inflammatory changes. Numerous noninflamed diverticula are seen throughout the large bowel. Persistent chronic twisting of the mesentery is seen involving the mid to lower abdomen, to the left of midline (axial CT images 26 through 40, CT series 2). No associated abnormal bowel loops are identified. Vascular/Lymphatic: Aortic atherosclerosis. No enlarged abdominal or pelvic lymph nodes. Reproductive: Status post hysterectomy. No adnexal masses. Other: No abdominal wall hernia. There is stable, marked severity laxity of the pelvic floor with subsequent posterior compartment descent and prolapse of the rectum and anorectal junction. No abdominopelvic ascites. Musculoskeletal: Marked severity multilevel degenerative changes are seen. IMPRESSION: 1. Stable marked severity  pelvic floor laxity with anterior and posterior compartment prolapse and subsequent partial obstruction of the left kidney secondary to mass effect on the distal left ureter. 2. 2 mm nonobstructing left renal calculus. 3. Colonic diverticulosis. 4. Small, stable  bilateral pleural effusions. 5. Evidence of prior cholecystectomy, appendectomy and hysterectomy. 6. Aortic atherosclerosis. Aortic Atherosclerosis (ICD10-I70.0). Electronically Signed   By: Aram Candela M.D.   On: 06/03/2023 19:50    Scheduled Meds:    allopurinol  100 mg Oral Daily   apixaban  2.5 mg Oral BID   ascorbic acid  500 mg Oral BID   atorvastatin  10 mg Oral Daily   calcium carbonate  1,250 mg Oral BID WC   Chlorhexidine Gluconate Cloth  6 each Topical Daily   cyanocobalamin  1,000 mcg Oral Daily   diltiazem  60 mg Oral TID   [START ON 06/05/2023] ferrous sulfate  325 mg Oral Q breakfast   furosemide  40 mg Oral Daily   metoprolol tartrate  100 mg Oral BID   multivitamin with minerals  1 tablet Oral Daily   ondansetron (ZOFRAN) IV  4 mg Intravenous Once   pantoprazole  40 mg Oral Daily   polyethylene glycol  17 g Oral Daily    Continuous Infusions:    sodium chloride     cefTRIAXone (ROCEPHIN)  IV     dextrose 5% lactated ringers 75 mL/hr at 06/04/23 0517     LOS: 0 days     Marcellus Scott, MD,  FACP, [facility], [facility], [facility]   [facility] [place]      To contact the attending provider between 7A-7P or the covering provider during after hours 7P-7A, please log into the web site [email or web address] and access using universal [place] password for that web site. If you do not have the password, please call the hospital operator.  06/04/2023, 12:42 PM

## 2023-06-05 ENCOUNTER — Inpatient Hospital Stay (HOSPITAL_COMMUNITY): Payer: Medicare Other

## 2023-06-05 DIAGNOSIS — I5033 Acute on chronic diastolic (congestive) heart failure: Secondary | ICD-10-CM

## 2023-06-05 DIAGNOSIS — N3001 Acute cystitis with hematuria: Secondary | ICD-10-CM | POA: Diagnosis not present

## 2023-06-05 LAB — CBC
HCT: 35.8 % — ABNORMAL LOW (ref 36.0–46.0)
Hemoglobin: 11.4 g/dL — ABNORMAL LOW (ref 12.0–15.0)
MCH: 31.3 pg (ref 26.0–34.0)
MCHC: 31.8 g/dL (ref 30.0–36.0)
MCV: 98.4 fL (ref 80.0–100.0)
Platelets: 195 10*3/uL (ref 150–400)
RBC: 3.64 MIL/uL — ABNORMAL LOW (ref 3.87–5.11)
RDW: 14.2 % (ref 11.5–15.5)
WBC: 8.6 10*3/uL (ref 4.0–10.5)
nRBC: 0 % (ref 0.0–0.2)

## 2023-06-05 LAB — MRSA NEXT GEN BY PCR, NASAL: MRSA by PCR Next Gen: DETECTED — AB

## 2023-06-05 MED ORDER — FUROSEMIDE 20 MG PO TABS: 40.0000 mg | ORAL_TABLET | ORAL | 3 refills | Status: DC

## 2023-06-05 MED ORDER — FUROSEMIDE 10 MG/ML IJ SOLN
40.0000 mg | Freq: Once | INTRAMUSCULAR | Status: AC
  Administered 2023-06-05: 40 mg via INTRAVENOUS
  Filled 2023-06-05: qty 4

## 2023-06-05 MED ORDER — POTASSIUM CHLORIDE CRYS ER 20 MEQ PO TBCR
40.0000 meq | EXTENDED_RELEASE_TABLET | Freq: Once | ORAL | Status: AC
  Administered 2023-06-05: 40 meq via ORAL
  Filled 2023-06-05: qty 2

## 2023-06-05 NOTE — Evaluation (Signed)
Physical Therapy Evaluation Patient Details Name: Adrienne [name] MRN: [id number] DOB: 1927/11/05 Today's Date: 06/05/2023  History of Present Illness  89 yo presented on 06/03/2023 with complaints of dysuria, pain around catheter site and bladder spasms which are typical of her prior UTIs.  Admitted for complicated UTI. PMH: chronic Foley dependent urinary retention, afib, CAD, GERD, HLD, HTN, pulmonary HTN, TIA/stroke  Clinical Impression  Pt admitted with above diagnosis. Pt from home, ind with self care, has agency assisting with household chores for 4 hrs/day for 3 days/week, ambulating with RW or rollator in the community, sometimes not using AD in the home, reports 1 fall in the last 6 months. Pt mobilizing with RW, step through gait pattern, good steadiness, conversational until pt became dyspnic needing to focus on pursed lip breathing. Pt desat to 87% with amb, returned 2L O2 once back in room and SpO2 94%- notified nursing. Anticipate HHPT at d/c with good family support. Pt currently with functional limitations due to the deficits listed below (see PT Problem List). Pt will benefit from acute skilled PT to increase their independence and safety with mobility to allow discharge.           If plan is discharge home, recommend the following: A little help with walking and/or transfers;A little help with bathing/dressing/bathroom;Assistance with cooking/housework;Assist for transportation   Can travel by private vehicle        Equipment Recommendations None recommended by PT  Recommendations for Other Services       Functional Status Assessment Patient has had a recent decline in their functional status and demonstrates the ability to make significant improvements in function in a reasonable and predictable amount of time.     Precautions / Restrictions Precautions Precautions: Fall Precaution Comments: chronic foley catheter Restrictions Weight Bearing Restrictions: No       Mobility  Bed Mobility               General bed mobility comments: pt OOB upon arrival    Transfers Overall transfer level: Needs assistance Equipment used: Rolling walker (2 wheels) Transfers: Sit to/from Stand Sit to Stand: Supervision           General transfer comment: supv for transfers with RW    Ambulation/Gait Ambulation/Gait assistance: Supervision, Contact guard assist Gait Distance (Feet): 80 Feet   Gait Pattern/deviations: Step-through pattern, Decreased stride length, Trunk flexed Gait velocity: decreased     General Gait Details: step through gait pattern, no overt LOB, trunk forward flexed, initially conversational and becomes dyspnic final ~20 ft, cued for pursed lip breathing, on RA with SpO2 87%, returned 2L O2 once returned to room and notified RN  Stairs            Wheelchair Mobility     Tilt Bed    Modified Rankin (Stroke Patients Only)       Balance Overall balance assessment: Mild deficits observed, not formally tested                                           Pertinent Vitals/Pain Pain Assessment Pain Assessment: No/denies pain    Home Living Family/patient expects to be discharged to:: Private residence Living Arrangements: Spouse/significant other Available Help at Discharge: Family;Available 24 hours/day Type of Home: House Home Access: Level entry       Home Layout: One level Home Equipment: Educational psychologist (4  wheels);Rolling Walker (2 wheels);Other (comment) (O2 nighttime only) Additional Comments: daughter provides transportation    Prior Function Prior Level of Function : Independent/Modified Independent;Needs assist             Mobility Comments: pt reports occasional RW or rollator in the home and sometimes no AD; pt reports RW or rollator outside the home, reports 1 fall in 6 months ADLs Comments: pt reports ind with self care needing seated rest breaks, assistance  with household chores MWF for 4 hours/day     Extremity/Trunk Assessment   Upper Extremity Assessment Upper Extremity Assessment: Overall WFL for tasks assessed    Lower Extremity Assessment Lower Extremity Assessment: Generalized weakness (AROM WFL, strength grossly 4-/5, denies numbness/tingling)    Cervical / Trunk Assessment Cervical / Trunk Assessment: Kyphotic  Communication   Communication Communication: No apparent difficulties  Cognition Arousal: Alert Behavior During Therapy: WFL for tasks assessed/performed Overall Cognitive Status: Within Functional Limits for tasks assessed                                          General Comments General comments (skin integrity, edema, etc.): pt denies O2 use during the day, dyspnea noted with amb and SpO2 noted 97%, returned 2L O2 once returned to room and SpO2 97%    Exercises     Assessment/Plan    PT Assessment Patient needs continued PT services  PT Problem List Decreased strength;Decreased activity tolerance;Decreased balance;Pain       PT Treatment Interventions DME instruction;Gait training;Functional mobility training;Therapeutic activities;Therapeutic exercise;Balance training;Patient/family education    PT Goals (Current goals can be found in the Care Plan section)  Acute Rehab PT Goals Patient Stated Goal: "feel better" PT Goal Formulation: With patient/family Time For Goal Achievement: 06/19/23 Potential to Achieve Goals: Good    Frequency Min 1X/week     Co-evaluation               AM-PAC PT "6 Clicks" Mobility  Outcome Measure Help needed turning from your back to your side while in a flat bed without using bedrails?: A Little Help needed moving from lying on your back to sitting on the side of a flat bed without using bedrails?: A Little Help needed moving to and from a bed to a chair (including a wheelchair)?: A Little Help needed standing up from a chair using your arms  (e.g., wheelchair or bedside chair)?: A Little Help needed to walk in hospital room?: A Little Help needed climbing 3-5 steps with a railing? : A Little 6 Click Score: 18    End of Session Equipment Utilized During Treatment: Gait belt Activity Tolerance: Patient tolerated treatment well Patient left: in bed;with call bell/phone within reach Nurse Communication: Mobility status PT Visit Diagnosis: Other abnormalities of gait and mobility (R26.89);Muscle weakness (generalized) (M62.81)    Time: 6295-2841 PT Time Calculation (min) (ACUTE ONLY): 31 min   Charges:   PT Evaluation $PT Eval Low Complexity: 1 Low PT Treatments $Gait Training: 8-22 mins PT General Charges $$ ACUTE PT VISIT: 1 Visit         Tori Kwabena Strutz PT, DPT 06/05/23, 12:37 PM

## 2023-06-05 NOTE — Plan of Care (Signed)

## 2023-06-05 NOTE — Progress Notes (Signed)
PROGRESS NOTE   Adrienne [name]  WUJ:[id number]    DOB: 07-17-1928    DOA: 06/03/2023  PCP: Sigmund Hazel, MD   I have briefly reviewed patients previous medical records in [facility].  Chief Complaint  Patient presents with   Urinary Tract Infection    Brief Narrative:  89 year old married female, ambulates with the help of a walker, medical history significant for chronic Foley dependent urinary retention, atrial fibrillation on Eliquis, CAD, GERD, HLD, HTN, pulmonary hypertension, TIA/stroke who presented to ED 06/03/2023 with complaints of dysuria, pain around catheter site and bladder spasms which are typical of her prior UTIs.  Admitted for complicated UTI.  Was complicated by mild acute on chronic diastolic CHF.  Assessment & Plan:  Principal Problem:   UTI (urinary tract infection) Active Problems:   Permanent atrial fibrillation (HCC)   Pulmonary HTN (HCC)   Gait disturbance, post-stroke   GERD (gastroesophageal reflux disease)   Multiple pulmonary nodules   Cerebrovascular disease   Complicated UTI/CAUTI History of chronic Foley dependent urinary retention.  Has had Foley catheter for approximately 2 years per her report Foley catheter was last changed PTA on 05/22/2023 and again changed in ED on admission 06/03/2023 Follows with Dr. Sherron Monday, Urology CT renal stone study 9/21: Stable marked severity pelvic floor laxity with anterior and posterior compartment prolapse and subsequent partial obstruction of the left kidney secondary to mass effect on the distal left ureter.  2 mm nonobstructing left renal calculus. Urine microscopy nitrites +, >50 RBC/hpf, >50 WBC per hpf. Currently on IV ceftriaxone, day 3 of 3.  Unfortunately urine culture from 9/21 is not helpful, shows multiple species. However urine culture from 8/22 showed MRSA, but only 10K colonies per mL.  Urine culture from 7/11 also showed <10K colonies per mL of insignificant growth.  None of these  urine culture results are helpful to tailor treatment.  Most helpful urine culture was from 5/25 which showed E. coli >100k col/ml, sensitive to ceftriaxone recent DC IV fluids.  Chronic urinary retention: Foley catheter changed in ED 9/21. Outpatient follow-up with urology.  Paroxysmal atrial fibrillation: Rate controlled on telemetry this morning Continue home dose of Cardizem and metoprolol. Continue Eliquis anticoagulation for secondary hypercoagulable state  Hypertension Continue home dose of metoprolol and Cardizem.  Controlled  Hyperlipidemia Continue home dose of atorvastatin  Acute on chronic diastolic CHF PTA on Lasix 40 mg daily. Has mild DOE, chest x-ray with mild CHF IV Lasix 40 mg x 1 today, continue oral Lasix 40 mg daily from tomorrow. Patient states that in prior admissions to, they have had to give her IV Lasix. Also precipitated by IV fluids that she had creatinine on admission. TTE 8/22: LVEF 60-65% pulmonary hypertension, moderate MR, moderate TR no aortic stenosis. Follows with Dr. Anne Fu, cardiology  GERD Continue PPI  History of TIA/CVA Continue apixaban anticoagulation.  Chronic ambulatory dysfunction Ambulates with the help of a walker. PT evaluation appreciated, home with PT.  Anemia May be dilutional.  Stable.   There is no height or weight on file to calculate BMI.   DVT prophylaxis: apixaban (ELIQUIS) tablet 2.5 mg Start: 06/04/23 0045     Code Status: Do not attempt resuscitation (DNR) PRE-ARREST INTERVENTIONS DESIRED:  Family Communication: Spouse at bedside. Disposition:  Continue IV antibiotics for additional 24 hours,     Consultants:   None  Procedures:     Antimicrobials:   As above.   Subjective:  States that since yesterday afternoon, has had  some dyspnea on exertion, none at rest.  No chest pain.  Indicates that during prior hospitalizations with UTI, she had to be given IV Lasix for her CHF.  Mild burning around  Foley catheter site.  No bladder spasms.  Objective:   Vitals:   06/05/23 0516 06/05/23 1056 06/05/23 1059 06/05/23 1101  BP: (!) 153/54 135/85 136/85 136/85  Pulse: 69   71  Resp: 18     Temp: 97.9 F (36.6 C)     TempSrc: Oral     SpO2: 98% 97%      General exam: Elderly female, moderately built and frail, seen ambulate with a walker with PT supervision and assistance, steadily.  Subsequently seen lying comfortably propped up in bed without distress. Respiratory system: Occasional basal crackles but otherwise clear to auscultation.  No increased work of breathing. Cardiovascular system: S1 & S2 heard, irregularly irregular.  No JVD or murmurs.  Trace bilateral ankle edema.  Telemetry personally reviewed: A-fib with controlled ventricular rate. Gastrointestinal system: Abdomen is nondistended, soft and nontender. No organomegaly or masses felt. Normal bowel sounds heard. GU: Foley catheter in place.  Clear straw-colored urine in bag. Central nervous system: Alert and oriented. No focal neurological deficits. Extremities: Symmetric 5 x 5 power. Skin: No rashes, lesions or ulcers Psychiatry: Judgement and insight appear normal. Mood & affect appropriate.     Data Reviewed:   I have personally reviewed following labs and imaging studies   CBC: Recent Labs  Lab 06/03/23 1807 06/04/23 0716 06/05/23 0549  WBC 6.1 7.4 8.6  NEUTROABS 4.0  --   --   HGB 11.1* 10.1* 11.4*  HCT 33.7* 31.2* 35.8*  MCV 94.4 95.4 98.4  PLT 203 190 195    Basic Metabolic Panel: Recent Labs  Lab 06/03/23 1807 06/04/23 0716  NA 136 141  K 4.1 3.5  CL 98 103  CO2 31 28  GLUCOSE 134* 111*  BUN 22 19  CREATININE 1.08* 0.91  CALCIUM 9.6 9.0    Liver Function Tests: Recent Labs  Lab 06/04/23 0716  AST 14*  ALT 10  ALKPHOS 56  BILITOT 0.6  PROT 6.1*  ALBUMIN 2.9*    CBG: No results for input(s): "GLUCAP" in the last 168 hours.  Microbiology Studies:   Recent Results (from the  past 240 hour(s))  Urine Culture     Status: Abnormal   Collection Time: 06/03/23  6:07 PM   Specimen: Urine, Clean Catch  Result Value Ref Range Status   Specimen Description   Final    URINE, CLEAN CATCH Performed at [facility], [address] [place], [place] [address]    Special Requests   Final    NONE Performed at [facility], [address] [place], [place] [address]    Culture MULTIPLE SPECIES PRESENT, SUGGEST RECOLLECTION (A)  Final   Report Status 06/04/2023 FINAL  Final  MRSA Next Gen by PCR, Nasal     Status: Abnormal   Collection Time: 06/05/23 11:40 AM   Specimen: Nasal Mucosa; Nasal Swab  Result Value Ref Range Status   MRSA by PCR Next Gen DETECTED (A) NOT DETECTED Final    Comment: (NOTE) The GeneXpert MRSA Assay (FDA approved for NASAL specimens only), is one component of a comprehensive MRSA colonization surveillance program. It is not intended to diagnose MRSA infection nor to guide or monitor treatment for MRSA infections. Test performance is not FDA approved in patients less than 55 years old. Performed at [facility]  [facility], [address], [place], [place] [address]     Radiology Studies:  DG CHEST PORT 1 VIEW  Result Date: 06/05/2023 CLINICAL DATA:  Congestive heart failure. EXAM: PORTABLE CHEST 1 VIEW COMPARISON:  05/04/2023 FINDINGS: Mild cardiac enlargement. Aortic atherosclerotic calcifications. Mild diffuse increase interstitial markings and small pleural effusions compatible with mild CHF. No focal airspace consolidation. Surgical clips noted in the left axilla and left breast. No focal bone abnormality. IMPRESSION: Mild CHF. Aortic Atherosclerosis (ICD10-I70.0). Electronically Signed   By: Signa Kell M.D.   On: 06/05/2023 12:41   CT Renal Stone Study  Result Date: 06/03/2023 CLINICAL DATA:  Recent diagnosis of UTI with continued complications. EXAM: CT ABDOMEN AND PELVIS WITHOUT  CONTRAST TECHNIQUE: Multidetector CT imaging of the abdomen and pelvis was performed following the standard protocol without IV contrast. RADIATION DOSE REDUCTION: This exam was performed according to the departmental dose-optimization program which includes automated exposure control, adjustment of the mA and/or kV according to patient size and/or use of iterative reconstruction technique. COMPARISON:  March 23, 2023 and December 25, 2015 FINDINGS: Lower chest: There are small, stable bilateral pleural effusions. Hepatobiliary: No focal liver abnormality is seen. Status post cholecystectomy. No biliary dilatation. Pancreas: Unremarkable. No pancreatic ductal dilatation or surrounding inflammatory changes. Spleen: Normal in size without focal abnormality. Adrenals/Urinary Tract: Adrenal glands are unremarkable. Kidneys are normal in size, without focal lesions. A 2 mm nonobstructing renal calculus is seen within the mid left kidney. There is stable marked severity left-sided hydronephrosis and hydroureter which extends into an area of posterior pelvic floor prolapse. The urinary bladder is poorly distended and subsequently limited in evaluation. A Foley catheter is in place. Stomach/Bowel: Stomach is within normal limits. The appendix is surgically absent. No evidence of bowel wall thickening, distention, or inflammatory changes. Numerous noninflamed diverticula are seen throughout the large bowel. Persistent chronic twisting of the mesentery is seen involving the mid to lower abdomen, to the left of midline (axial CT images 26 through 40, CT series 2). No associated abnormal bowel loops are identified. Vascular/Lymphatic: Aortic atherosclerosis. No enlarged abdominal or pelvic lymph nodes. Reproductive: Status post hysterectomy. No adnexal masses. Other: No abdominal wall hernia. There is stable, marked severity laxity of the pelvic floor with subsequent posterior compartment descent and prolapse of the rectum and  anorectal junction. No abdominopelvic ascites. Musculoskeletal: Marked severity multilevel degenerative changes are seen. IMPRESSION: 1. Stable marked severity pelvic floor laxity with anterior and posterior compartment prolapse and subsequent partial obstruction of the left kidney secondary to mass effect on the distal left ureter. 2. 2 mm nonobstructing left renal calculus. 3. Colonic diverticulosis. 4. Small, stable bilateral pleural effusions. 5. Evidence of prior cholecystectomy, appendectomy and hysterectomy. 6. Aortic atherosclerosis. Aortic Atherosclerosis (ICD10-I70.0). Electronically Signed   By: Aram Candela M.D.   On: 06/03/2023 19:50    Scheduled Meds:    allopurinol  100 mg Oral Daily   apixaban  2.5 mg Oral BID   ascorbic acid  500 mg Oral BID   atorvastatin  10 mg Oral Daily   calcium carbonate  1,250 mg Oral BID WC   Chlorhexidine Gluconate Cloth  6 each Topical Daily   cyanocobalamin  1,000 mcg Oral Daily   diltiazem  60 mg Oral TID   ferrous sulfate  325 mg Oral Q breakfast   furosemide  40 mg Oral Daily   metoprolol tartrate  100 mg Oral BID   multivitamin with minerals  1 tablet Oral Daily  pantoprazole  40 mg Oral Daily   polyethylene glycol  17 g Oral Daily    Continuous Infusions:    cefTRIAXone (ROCEPHIN)  IV Stopped (06/04/23 1833)     LOS: 1 day     Marcellus Scott, MD,  FACP, [facility], [facility], [facility]   [facility] [place]      To contact the attending provider between 7A-7P or the covering provider during after hours 7P-7A, please log into the web site [email or web address] and access using universal [place] password for that web site. If you do not have the password, please call the hospital operator.  06/05/2023, 2:10 PM

## 2023-06-06 DIAGNOSIS — I5033 Acute on chronic diastolic (congestive) heart failure: Secondary | ICD-10-CM | POA: Diagnosis not present

## 2023-06-06 DIAGNOSIS — N3001 Acute cystitis with hematuria: Secondary | ICD-10-CM | POA: Diagnosis not present

## 2023-06-06 LAB — BASIC METABOLIC PANEL
Anion gap: 8 (ref 5–15)
BUN: 20 mg/dL (ref 8–23)
CO2: 29 mmol/L (ref 22–32)
Calcium: 9 mg/dL (ref 8.9–10.3)
Chloride: 101 mmol/L (ref 98–111)
Creatinine, Ser: 1.18 mg/dL — ABNORMAL HIGH (ref 0.44–1.00)
GFR, Estimated: 43 mL/min — ABNORMAL LOW (ref >60)
Glucose, Bld: 87 mg/dL (ref 70–99)
Potassium: 4.2 mmol/L (ref 3.5–5.1)
Sodium: 138 mmol/L (ref 135–145)

## 2023-06-06 LAB — BRAIN NATRIURETIC PEPTIDE: B Natriuretic Peptide: 355 pg/mL — ABNORMAL HIGH (ref 0.0–100.0)

## 2023-06-06 MED ORDER — FUROSEMIDE 10 MG/ML IJ SOLN
40.0000 mg | Freq: Once | INTRAMUSCULAR | Status: AC
  Administered 2023-06-06: 40 mg via INTRAVENOUS
  Filled 2023-06-06: qty 4

## 2023-06-06 MED ORDER — MUPIROCIN 2 % EX OINT
1.0000 | TOPICAL_OINTMENT | Freq: Two times a day (BID) | CUTANEOUS | Status: DC
  Administered 2023-06-06 – 2023-06-07 (×3): 1 via NASAL
  Filled 2023-06-06 (×2): qty 22

## 2023-06-06 NOTE — Progress Notes (Signed)
PROGRESS NOTE   Adrienne [name]  ZOX:[id number]    DOB: 05/24/1928    DOA: 06/03/2023  PCP: Sigmund Hazel, MD   I have briefly reviewed patients previous medical records in [facility].  Chief Complaint  Patient presents with   Urinary Tract Infection    Brief Narrative:  89 year old married female, ambulates with the help of a walker, medical history significant for chronic Foley dependent urinary retention, atrial fibrillation on Eliquis, CAD, GERD, HLD, HTN, pulmonary hypertension, TIA/stroke who presented to ED 06/03/2023 with complaints of dysuria, pain around catheter site and bladder spasms which are typical of her prior UTIs.  Admitted for complicated UTI.  Was complicated by mild acute on chronic diastolic CHF, ongoing IV Lasix diuresis.  Assessment & Plan:  Principal Problem:   UTI (urinary tract infection) Active Problems:   Permanent atrial fibrillation (HCC)   Pulmonary HTN (HCC)   Gait disturbance, post-stroke   GERD (gastroesophageal reflux disease)   Multiple pulmonary nodules   Cerebrovascular disease   Complicated UTI/CAUTI History of chronic Foley dependent urinary retention.  Has had Foley catheter for approximately 2 years per her report Foley catheter was last changed PTA on 05/22/2023 and again changed in ED on admission 06/03/2023 Follows with Dr. Sherron Monday, Urology CT renal stone study 9/21: Stable marked severity pelvic floor laxity with anterior and posterior compartment prolapse and subsequent partial obstruction of the left kidney secondary to mass effect on the distal left ureter.  2 mm nonobstructing left renal calculus. Urine microscopy nitrites +, >50 RBC/hpf, >50 WBC per hpf. Currently on IV ceftriaxone.  Given complicated UTI, treat for total 7 days, IV ceftriaxone while in-house and may be cefadroxil at time of discharge.  Unfortunately urine culture from 9/21 is not helpful, shows multiple species. However urine culture from 8/22 showed  MRSA, but only 10K colonies per mL.  Urine culture from 7/11 also showed <10K colonies per mL of insignificant growth.  None of these urine culture results are helpful to tailor treatment.  Most helpful urine culture was from 5/25 which showed E. coli >100k col/ml, sensitive to ceftriaxone recent DC IV fluids.  Chronic urinary retention: Foley catheter changed in ED 9/21. Outpatient follow-up with urology.  Paroxysmal atrial fibrillation: Rate controlled on telemetry this morning Continue home dose of Cardizem and metoprolol. Continue Eliquis anticoagulation for secondary hypercoagulable state  Hypertension Continue home dose of metoprolol and Cardizem.  Controlled  Hyperlipidemia Continue home dose of atorvastatin  Acute on chronic diastolic CHF PTA on Lasix 40 mg daily. Has mild DOE, chest x-ray with mild CHF.  Although SOB is better, still with SOB on exertion with ambulation from hospital bed to bathroom.  Patient declined oral Lasix.  Repeated IV Lasix 40 mg x 1.  BNP 355. Patient states that in prior admissions to, they have had to give her IV Lasix. Also precipitated by IV fluids that she had creatinine on admission. TTE 8/22: LVEF 60-65% pulmonary hypertension, moderate MR, moderate TR no aortic stenosis. Follows with Dr. Anne Fu, cardiology Creatinine has increased slightly from 0.91-1.18.  Hesitant to diurese more aggressively.  Follow BMP in a.m. and clinically to determine if needs further IV diuresis.  GERD Continue PPI  History of TIA/CVA Continue apixaban anticoagulation.  Chronic ambulatory dysfunction Ambulates with the help of a walker. PT evaluation appreciated, home with PT. Ambulated to 30 feet with PT.  Can  Anemia May be dilutional.  Stable.   There is no height or weight on file to  calculate BMI.   DVT prophylaxis: apixaban (ELIQUIS) tablet 2.5 mg Start: 06/04/23 0045     Code Status: Do not attempt resuscitation (DNR) PRE-ARREST INTERVENTIONS  DESIRED:  Family Communication: Spouse at bedside. Disposition:  Need IV Lasix, IV antibiotics and reassess in a.m.     Consultants:   None  Procedures:     Antimicrobials:   As above.   Subjective:  States that even though SOB is better, still with SOB on minimal exertion from hospital bed to bathroom.  Breathing not at her baseline.  This is new for her.  Objective:   Vitals:   06/06/23 0519 06/06/23 1123 06/06/23 1124 06/06/23 1317  BP: (!) 130/44 (!) 151/60  130/68  Pulse: 68  83 68  Resp: 18   18  Temp: 98 F (36.7 C)   98.3 F (36.8 C)  TempSrc: Oral   Oral  SpO2: 98%   98%    General exam: Elderly female, moderately built and frail, lying comfortably propped up in bed without distress. Respiratory system: Few bibasal crackles.  Rest of lung fields clear to auscultation.  No increased work of breathing. Cardiovascular system: S1 & S2 heard, irregularly irregular.  No JVD or murmurs.  Trace bilateral ankle edema.  Gastrointestinal system: Abdomen is nondistended, soft and nontender. No organomegaly or masses felt. Normal bowel sounds heard. GU: Foley catheter in place.  Clear straw-colored urine in bag. Central nervous system: Alert and oriented. No focal neurological deficits. Extremities: Symmetric 5 x 5 power. Skin: No rashes, lesions or ulcers Psychiatry: Judgement and insight appear normal. Mood & affect appropriate.     Data Reviewed:   I have personally reviewed following labs and imaging studies   CBC: Recent Labs  Lab 06/03/23 1807 06/04/23 0716 06/05/23 0549  WBC 6.1 7.4 8.6  NEUTROABS 4.0  --   --   HGB 11.1* 10.1* 11.4*  HCT 33.7* 31.2* 35.8*  MCV 94.4 95.4 98.4  PLT 203 190 195    Basic Metabolic Panel: Recent Labs  Lab 06/03/23 1807 06/04/23 0716 06/06/23 0557  NA 136 141 138  K 4.1 3.5 4.2  CL 98 103 101  CO2 31 28 29   GLUCOSE 134* 111* 87  BUN 22 19 20   CREATININE 1.08* 0.91 1.18*  CALCIUM 9.6 9.0 9.0    Liver  Function Tests: Recent Labs  Lab 06/04/23 0716  AST 14*  ALT 10  ALKPHOS 56  BILITOT 0.6  PROT 6.1*  ALBUMIN 2.9*    CBG: No results for input(s): "GLUCAP" in the last 168 hours.  Microbiology Studies:   Recent Results (from the past 240 hour(s))  Urine Culture     Status: Abnormal   Collection Time: 06/03/23  6:07 PM   Specimen: Urine, Clean Catch  Result Value Ref Range Status   Specimen Description   Final    URINE, CLEAN CATCH Performed at [facility], [address], [place], [place] [address]    Special Requests   Final    NONE Performed at [facility], [address], [place], [place] [address]    Culture MULTIPLE SPECIES PRESENT, SUGGEST RECOLLECTION (A)  Final   Report Status 06/04/2023 FINAL  Final  MRSA Next Gen by PCR, Nasal     Status: Abnormal   Collection Time: 06/05/23 11:40 AM   Specimen: Nasal Mucosa; Nasal Swab  Result Value Ref Range Status   MRSA by PCR Next Gen DETECTED (A) NOT DETECTED Final    Comment: (NOTE)  The GeneXpert MRSA Assay (FDA approved for NASAL specimens only), is one component of a comprehensive MRSA colonization surveillance program. It is not intended to diagnose MRSA infection nor to guide or monitor treatment for MRSA infections. Test performance is not FDA approved in patients less than 37 years old. Performed at [facility], [address] [place], [place] [address]     Radiology Studies:  DG CHEST PORT 1 VIEW  Result Date: 06/05/2023 CLINICAL DATA:  Congestive heart failure. EXAM: PORTABLE CHEST 1 VIEW COMPARISON:  05/04/2023 FINDINGS: Mild cardiac enlargement. Aortic atherosclerotic calcifications. Mild diffuse increase interstitial markings and small pleural effusions compatible with mild CHF. No focal airspace consolidation. Surgical clips noted in the left axilla and left breast. No focal bone abnormality. IMPRESSION: Mild CHF. Aortic Atherosclerosis  (ICD10-I70.0). Electronically Signed   By: Signa Kell M.D.   On: 06/05/2023 12:41    Scheduled Meds:    allopurinol  100 mg Oral Daily   apixaban  2.5 mg Oral BID   ascorbic acid  500 mg Oral BID   atorvastatin  10 mg Oral Daily   calcium carbonate  1,250 mg Oral BID WC   Chlorhexidine Gluconate Cloth  6 each Topical Daily   cyanocobalamin  1,000 mcg Oral Daily   diltiazem  60 mg Oral TID   ferrous sulfate  325 mg Oral Q breakfast   furosemide  40 mg Oral Daily   metoprolol tartrate  100 mg Oral BID   multivitamin with minerals  1 tablet Oral Daily   mupirocin ointment  1 Application Nasal BID   pantoprazole  40 mg Oral Daily   polyethylene glycol  17 g Oral Daily    Continuous Infusions:    cefTRIAXone (ROCEPHIN)  IV 1 g (06/05/23 1732)     LOS: 2 days     Marcellus Scott, MD,  FACP, [facility], [facility], [facility]   [facility] [place]      To contact the attending provider between 7A-7P or the covering provider during after hours 7P-7A, please log into the web site [email or web address] and access using universal [place] password for that web site. If you do not have the password, please call the hospital operator.  06/06/2023, 4:14 PM

## 2023-06-06 NOTE — Progress Notes (Signed)
Mobility Specialist - Progress Note   06/06/23 1404  Mobility  Activity Ambulated with assistance in hallway  Level of Assistance Modified independent, requires aide device or extra time  Assistive Device Front wheel walker  Distance Ambulated (ft) 230 ft  Activity Response Tolerated well  Mobility Referral Yes  $Mobility charge 1 Mobility  Mobility Specialist Start Time (ACUTE ONLY) 0150  Mobility Specialist Stop Time (ACUTE ONLY) 0202  Mobility Specialist Time Calculation (min) (ACUTE ONLY) 12 min   Pt received in bed and agreeable to mobility. No complaints during session. Pt to bed after session with all needs met.    [facility]

## 2023-06-07 DIAGNOSIS — N3001 Acute cystitis with hematuria: Secondary | ICD-10-CM | POA: Diagnosis not present

## 2023-06-07 LAB — BASIC METABOLIC PANEL
Anion gap: 8 (ref 5–15)
BUN: 20 mg/dL (ref 8–23)
CO2: 31 mmol/L (ref 22–32)
Calcium: 8.9 mg/dL (ref 8.9–10.3)
Chloride: 96 mmol/L — ABNORMAL LOW (ref 98–111)
Creatinine, Ser: 1.02 mg/dL — ABNORMAL HIGH (ref 0.44–1.00)
GFR, Estimated: 51 mL/min — ABNORMAL LOW (ref >60)
Glucose, Bld: 102 mg/dL — ABNORMAL HIGH (ref 70–99)
Potassium: 4 mmol/L (ref 3.5–5.1)
Sodium: 135 mmol/L (ref 135–145)

## 2023-06-07 MED ORDER — CEFADROXIL 500 MG PO CAPS: 500.0000 mg | ORAL_CAPSULE | Freq: Two times a day (BID) | ORAL | 0 refills | Status: AC

## 2023-06-07 NOTE — Progress Notes (Signed)
PT Cancellation Note  Patient Details Name: Daeonna Gant MRN: [id number] DOB: 07/03/1928   Cancelled Treatment:    Reason Eval/Treat Not Completed: Patient declined, reports waiting on daughter to pick her up and she is d/c from hospital. PT to continue to follow acutely if pt remains in hospital.    Johnny Bridge, PT Acute Rehab  Jacqualyn Posey 06/07/2023, 12:07 PM

## 2023-06-07 NOTE — TOC Transition Note (Signed)
Transition of Care [facility]) - CM/SW Discharge Note   Patient Details  Name: Adrienne [name] MRN: [id number] Date of Birth: 1928/09/03  Transition of Care [facility]) CM/SW Contact:  Otelia Santee, LCSW Phone Number: 06/07/2023, 10:17 AM   Clinical Narrative:    Met with pt and spouse at bedside to discuss recommendation for HHPT. Pt shares she has had HH in the past and prefers [place]. Pt shares she has a RW and O2 at home. Pt denies need for O2 for transportation and shares she typically only wears O2 at night. HHPT has been arranged with [facility]. No further TOC needs identified.    Final next level of care: Home w Home Health Services Barriers to Discharge: No Barriers Identified   Patient Goals and CMS Choice CMS Medicare.gov Compare Post Acute Care list provided to:: Patient Choice offered to / list presented to : Patient  Discharge Placement                    Name of family member notified: Patient and spouse Patient and family notified of of transfer: 06/07/23  Discharge Plan and Services Additional resources added to the After Visit Summary for                  DME Arranged: N/A DME Agency: NA       HH Arranged: PT HH Agency: [facility] Date [facility] Agency Contacted: 06/07/23 Time HH Agency Contacted: 1016 Representative spoke with at [facility] Agency: Cindie  Social Determinants of Health (SDOH) Interventions SDOH Screenings   Food Insecurity: No Food Insecurity (05/04/2023)  Housing: Patient Declined (05/04/2023)  Transportation Needs: No Transportation Needs (05/04/2023)  Utilities: Not At Risk (05/04/2023)  Tobacco Use: Medium Risk (06/03/2023)     Readmission Risk Interventions    06/07/2023   10:15 AM 05/05/2023    5:19 PM 02/06/2023    3:10 PM  Readmission Risk Prevention Plan  Transportation Screening Complete Complete Complete  Medication Review [employer]) Complete Complete Complete  PCP or Specialist appointment within 3-5 days  of discharge Complete  Complete  HRI or Home Care Consult Complete Complete Complete  SW Recovery Care/Counseling Consult Complete  Complete  Palliative Care Screening Not Applicable Not Applicable Not Applicable  Skilled Nursing Facility Not Applicable Not Applicable Not Applicable

## 2023-06-07 NOTE — Discharge Summary (Signed)
Physician Discharge Summary  Adrienne [name] ZOX:[id number] DOB: [birth year] DOA: 06/03/2023  PCP: Sigmund Hazel, MD  Admit date: 06/03/2023 Discharge date: 06/07/2023  Time spent: 27 minutes  Recommendations for Outpatient Follow-up:  Needs Chem-7 CBC in about 1 week Will need further testing/urodynamic studies at urology office will CC her urologist to make sure appointment is made Complete cefadroxil as per below Might require increase/decrease diuretic depending on labs within a week-8 L negative this hospital stay -Echocardiogram should be done in 6 months given prior history of severe mitral valve regurg  please do a skin check on her as she may have some SKs on her back requiring evaluation  Discharge Diagnoses:  MAIN problem for hospitalization   Complicated urinary tract infection in setting of indwelling chronic Foley Acute decompensated HFpEF secondary to volume resuscitation this hospital stay Permanent A-fib CHADVASC >4 History of TIA Ambulatory dysfunction chronic Dilutional anemia  Please see below for itemized issues addressed in HOpsital- refer to other progress notes for clarity if needed  Discharge Condition: Improved  Diet recommendation: Heart healthy  There were no vitals filed for this visit.  History of present illness:   89 year old white female home dwelling ambulates with walker Prior bladder prolapse with hydronephrosis with chronic indwelling Foley Permanent A-fib CHADVASC >4 on apixaban HFpEF 60-65% 04/2023-not a candidate for SGLT2 secondary to recurrent urinary infections--- she carries a diagnosis of severe mitral valve regurg from 2023 as well-does require an echo CKD 3B HTN HLD Left MCA infarct in 2017 requiring extensive rehab and was on a ventilator  She was admitted on 06/03/2023 with dysuria and irritation around her Foley and had been on Macrobid a week prior to coming into she had some nausea no fever catheter was exchanged in  the ED  CT renal study showed severe pelvic laxity with prolapse and partial obstruction of left kidney due to mass effect on distal left ureter-multiple colony-forming units grew on urine culture  Hospitalization complicated secondary to volume overload as above  Hospital Course:  Complicated UTI with cloudy prior to admission Will need outpatient follow-up with urologist-9/21 culture unhelpful-patient was on ceftriaxone for several days and will go home on 4 more days to complete 7-day course of antibiotics with cefadroxil May benefit from discussion of suprapubic catheter versus suppressive therapy as an outpatient  A-fib CHADVASC >4 on Eliquis Continues on Cardizem 60 3 times daily, Toprol 100 twice daily as well as Eliquis 2.5 twice daily-was predominantly rate controlled  Acute superimposed on chronic HFpEF Mitral valve regurg previously Diuresed with IV Lasix-last echo 05/04/2023 showed moderate MR only and does follow with cardiology Dr. Rene Kocher diuresed and will be going home on usual 3 times a week Lasix She uses oxygen at night-she will need to resume the same-she has been ambulating around the unit has no wheeze rales rhonchi and can go home  HTN HLD-h/o CVA 2017 with extensive rehab is only on apixaban  Home health PT ordered was ambulating in hospital fairly well   Discharge Exam: Vitals:   06/06/23 2346 06/07/23 0536  BP: 132/68 (!) 137/58  Pulse: 95 79  Resp:  14  Temp: 98.1 F (36.7 C) 98 F (36.7 C)  SpO2: 97% 99%    Subj on day of d/c    coherent pleasant no distress does not desat off of oxygen  General Exam on discharge   EOMI NCAT no focal deficit no icterus no pallor Slightly cachectic Chest is clear S1-2 no murmur   abdomen  soft no rebound no guarding  she does have an indwelling Foley  neuro is intact no focal deficit  Discharge Instructions   Discharge Instructions     Diet - low sodium heart healthy   Complete by: As directed     Discharge instructions   Complete by: As directed    This hospital stay you were diagnosed with a probable UTI although cultures did not really show 1 specific organism and we elected to treat you with 7 days total of antibiotics-to resuscitate you you also received some IV fluids during hospital stay and then developed some extra fluid on your requiring IV Lasix  Finish the course of antibiotics that have been prescribed for you and make sure you follow-up with your usual urologist Dr. McDiarmid I would recommend that you get your catheter changed as per his instructions-you will also need to resume your Lasix 3 times a week as you have been taking previously-there are no other medication changes that we have made Continue oxygen at night and follow-up with your regular physician for labs in about a week   Increase activity slowly   Complete by: As directed       Allergies as of 06/07/2023       Reactions   Valium [diazepam] Other (See Comments)   HYPERACTIVITY   Amiodarone Hcl [amiodarone] Other (See Comments)   A-Fib   Compazine [prochlorperazine] Other (See Comments)   HYPERACTIVITY    Other Other (See Comments)   "Kidney dye"--patient states this gave her severe shakes/chills   Thorazine [chlorpromazine] Other (See Comments)   HYPERACTIVITY    Zometa [zoledronic Acid] Other (See Comments)   PROBLEMS WITH EYES TIA   Ciprofloxacin Swelling, Other (See Comments)   "swelling" in Rt.elbow   Doxycycline Nausea Only   Zoster Vac Recomb Adjuvanted Other (See Comments)   Welt under injection site   Iodinated Contrast Media Anxiety, Other (See Comments)   Chills and anxiety Other Reaction(s): chills/shaking, Kidney dye        Medication List     TAKE these medications    acetaminophen 500 MG tablet Commonly known as: TYLENOL Take 1,000 mg by mouth every 12 (twelve) hours as needed for mild pain.   albuterol 108 (90 Base) MCG/ACT inhaler Commonly known as: Ventolin  HFA Inhale 1 puff into the lungs every 6 (six) hours as needed for wheezing or shortness of breath (Cough).   allopurinol 100 MG tablet Commonly known as: ZYLOPRIM Take 1 tablet (100 mg total) by mouth daily.   apixaban 2.5 MG Tabs tablet Commonly known as: ELIQUIS Take 1 tablet (2.5 mg total) by mouth 2 (two) times daily.   ascorbic acid 500 MG tablet Commonly known as: VITAMIN C Take 500 mg by mouth 2 (two) times daily.   atorvastatin 10 MG tablet Commonly known as: LIPITOR TAKE 1 TABLET DAILY   calcium carbonate 1500 (600 Ca) MG Tabs tablet Commonly known as: OSCAL Take 1,500 mg by mouth 2 (two) times daily with a meal.   cefadroxil 500 MG capsule Commonly known as: DURICEF Take 1 capsule (500 mg total) by mouth 2 (two) times daily for 4 days.   cyanocobalamin 1000 MCG tablet Commonly known as: VITAMIN B12 Take 1,000 mcg by mouth daily.   diltiazem 60 MG tablet Commonly known as: CARDIZEM TAKE 1 TABLET THREE TIMES A DAY   furosemide 20 MG tablet Commonly known as: LASIX Take 2 tablets (40 mg total) by mouth 3 (three) times a week. M  W F, and one tablet (20mg ) all other days What changed:  when to take this additional instructions   Iron 325 (65 Fe) MG Tabs Take 325 mg by mouth daily. (OTC)   metoprolol tartrate 100 MG tablet Commonly known as: LOPRESSOR TAKE 1 TABLET TWICE A DAY   multivitamin with minerals tablet Take 1 tablet by mouth daily.   omeprazole 10 MG capsule Commonly known as: PRILOSEC Take 10 mg by mouth every other day.   polyethylene glycol 17 g packet Commonly known as: MIRALAX / GLYCOLAX Take 17 g by mouth daily.   UP4 PROBIOTICS WOMENS PO Take 1 capsule by mouth daily with breakfast.   Vitamin B Complex Caps Take 1 capsule by mouth daily.       Allergies  Allergen Reactions   Valium [Diazepam] Other (See Comments)    HYPERACTIVITY    Amiodarone Hcl [Amiodarone] Other (See Comments)    A-Fib   Compazine [Prochlorperazine]  Other (See Comments)    HYPERACTIVITY    Other Other (See Comments)    "Kidney dye"--patient states this gave her severe shakes/chills   Thorazine [Chlorpromazine] Other (See Comments)    HYPERACTIVITY    Zometa [Zoledronic Acid] Other (See Comments)    PROBLEMS WITH EYES  TIA   Ciprofloxacin Swelling and Other (See Comments)    "swelling" in Rt.elbow   Doxycycline Nausea Only   Zoster Vac Recomb Adjuvanted Other (See Comments)    Welt under injection site   Iodinated Contrast Media Anxiety and Other (See Comments)    Chills and anxiety  Other Reaction(s): chills/shaking, Kidney dye      The results of significant diagnostics from this hospitalization (including imaging, microbiology, ancillary and laboratory) are listed below for reference.    Significant Diagnostic Studies: DG CHEST PORT 1 VIEW  Result Date: 06/05/2023 CLINICAL DATA:  Congestive heart failure. EXAM: PORTABLE CHEST 1 VIEW COMPARISON:  05/04/2023 FINDINGS: Mild cardiac enlargement. Aortic atherosclerotic calcifications. Mild diffuse increase interstitial markings and small pleural effusions compatible with mild CHF. No focal airspace consolidation. Surgical clips noted in the left axilla and left breast. No focal bone abnormality. IMPRESSION: Mild CHF. Aortic Atherosclerosis (ICD10-I70.0). Electronically Signed   By: Signa Kell M.D.   On: 06/05/2023 12:41   CT Renal Stone Study  Result Date: 06/03/2023 CLINICAL DATA:  Recent diagnosis of UTI with continued complications. EXAM: CT ABDOMEN AND PELVIS WITHOUT CONTRAST TECHNIQUE: Multidetector CT imaging of the abdomen and pelvis was performed following the standard protocol without IV contrast. RADIATION DOSE REDUCTION: This exam was performed according to the departmental dose-optimization program which includes automated exposure control, adjustment of the mA and/or kV according to patient size and/or use of iterative reconstruction technique. COMPARISON:  March 23, 2023 and December 25, 2015 FINDINGS: Lower chest: There are small, stable bilateral pleural effusions. Hepatobiliary: No focal liver abnormality is seen. Status post cholecystectomy. No biliary dilatation. Pancreas: Unremarkable. No pancreatic ductal dilatation or surrounding inflammatory changes. Spleen: Normal in size without focal abnormality. Adrenals/Urinary Tract: Adrenal glands are unremarkable. Kidneys are normal in size, without focal lesions. A 2 mm nonobstructing renal calculus is seen within the mid left kidney. There is stable marked severity left-sided hydronephrosis and hydroureter which extends into an area of posterior pelvic floor prolapse. The urinary bladder is poorly distended and subsequently limited in evaluation. A Foley catheter is in place. Stomach/Bowel: Stomach is within normal limits. The appendix is surgically absent. No evidence of bowel wall thickening, distention, or inflammatory changes. Numerous noninflamed  diverticula are seen throughout the large bowel. Persistent chronic twisting of the mesentery is seen involving the mid to lower abdomen, to the left of midline (axial CT images 26 through 40, CT series 2). No associated abnormal bowel loops are identified. Vascular/Lymphatic: Aortic atherosclerosis. No enlarged abdominal or pelvic lymph nodes. Reproductive: Status post hysterectomy. No adnexal masses. Other: No abdominal wall hernia. There is stable, marked severity laxity of the pelvic floor with subsequent posterior compartment descent and prolapse of the rectum and anorectal junction. No abdominopelvic ascites. Musculoskeletal: Marked severity multilevel degenerative changes are seen. IMPRESSION: 1. Stable marked severity pelvic floor laxity with anterior and posterior compartment prolapse and subsequent partial obstruction of the left kidney secondary to mass effect on the distal left ureter. 2. 2 mm nonobstructing left renal calculus. 3. Colonic diverticulosis. 4. Small,  stable bilateral pleural effusions. 5. Evidence of prior cholecystectomy, appendectomy and hysterectomy. 6. Aortic atherosclerosis. Aortic Atherosclerosis (ICD10-I70.0). Electronically Signed   By: Aram Candela M.D.   On: 06/03/2023 19:50    Microbiology: Recent Results (from the past 240 hour(s))  Urine Culture     Status: Abnormal   Collection Time: 06/03/23  6:07 PM   Specimen: Urine, Clean Catch  Result Value Ref Range Status   Specimen Description   Final    URINE, CLEAN CATCH Performed at [facility], [address], [place], [place] [address]    Special Requests   Final    NONE Performed at [facility], [address] [place], [place] [address]    Culture MULTIPLE SPECIES PRESENT, SUGGEST RECOLLECTION (A)  Final   Report Status 06/04/2023 FINAL  Final  MRSA Next Gen by PCR, Nasal     Status: Abnormal   Collection Time: 06/05/23 11:40 AM   Specimen: Nasal Mucosa; Nasal Swab  Result Value Ref Range Status   MRSA by PCR Next Gen DETECTED (A) NOT DETECTED Final    Comment: (NOTE) The GeneXpert MRSA Assay (FDA approved for NASAL specimens only), is one component of a comprehensive MRSA colonization surveillance program. It is not intended to diagnose MRSA infection nor to guide or monitor treatment for MRSA infections. Test performance is not FDA approved in patients less than 56 years old. Performed at [facility], [address] [place], [place] [address]      Labs: Basic Metabolic Panel: Recent Labs  Lab 06/03/23 1807 06/04/23 0716 06/06/23 0557 06/07/23 0629  NA 136 141 138 135  K 4.1 3.5 4.2 4.0  CL 98 103 101 96*  CO2 31 28 29 31   GLUCOSE 134* 111* 87 102*  BUN 22 19 20 20   CREATININE 1.08* 0.91 1.18* 1.02*  CALCIUM 9.6 9.0 9.0 8.9   Liver Function Tests: Recent Labs  Lab 06/04/23 0716  AST 14*  ALT 10  ALKPHOS 56  BILITOT 0.6  PROT 6.1*  ALBUMIN 2.9*   No results for input(s):  "LIPASE", "AMYLASE" in the last 168 hours. No results for input(s): "AMMONIA" in the last 168 hours. CBC: Recent Labs  Lab 06/03/23 1807 06/04/23 0716 06/05/23 0549  WBC 6.1 7.4 8.6  NEUTROABS 4.0  --   --   HGB 11.1* 10.1* 11.4*  HCT 33.7* 31.2* 35.8*  MCV 94.4 95.4 98.4  PLT 203 190 195   Cardiac Enzymes: No results for input(s): "CKTOTAL", "CKMB", "CKMBINDEX", "TROPONINI" in the last 168 hours. BNP: BNP (last 3 results) Recent Labs    03/23/23 0818 05/04/23 0306 06/06/23 0557  BNP 555.9* 348.6*  355.0*    ProBNP (last 3 results) No results for input(s): "PROBNP" in the last 8760 hours.  CBG: No results for input(s): "GLUCAP" in the last 168 hours.     Signed:  Rhetta Mura MD   [facility] 06/07/2023, 9:28 AM

## 2023-06-07 NOTE — Consult Note (Signed)
[facility] Consult   06/07/2023  Adrienne [name] 02-Sep-1928 [id number]  [facility] [[facility]]  Accountable Care Organization [ACO] Patient: Medicare ACO REACH  Primary Care Provider: Sigmund Hazel, MD, [facility] provider at [facility] is listed for the transition of care follow up appointments and calls   [facility] Liaison screened the patient remotely at [facility].    The patient was screened for 30 day readmission hospitalization with noted extreme risk score for unplanned readmission risk 5 hospital admissions 2 ED visits in 6 months.  The patient was assessed for potential [facility]) Care Management service needs for post hospital transition for care coordination. Review of patient's electronic medical record reveals patient is for home with [facility].   Plan: [facility] Liaison will continue to follow progress and disposition to asess for post hospital community care coordination/management needs.  Referral request for community care coordination: will update [facility] team of disposition and high risk for unplanned readmission, SDOH intact per notes.   Community Care Management/Population Health does not replace or interfere with any arrangements made by the Inpatient Transition of Care team.   For questions contact:   Charlesetta Shanks, RN, BSN, CCM [place]  [facility], [facility] Liaison Direct Dial: [phone] or secure chat Website: [email or web address]

## 2023-06-07 NOTE — Plan of Care (Signed)

## 2023-06-08 ENCOUNTER — Telehealth: Payer: Self-pay | Admitting: Cardiology

## 2023-06-08 NOTE — Telephone Encounter (Signed)
Pt c/o medication issue:  1. Name of Medication: furosemide (LASIX) 20 MG tablet   2. How are you currently taking this medication (dosage and times per day)?   3. Are you having a reaction (difficulty breathing--STAT)?   4. What is your medication issue? Patient's husband is requesting call back to discuss medication that was changed in hospital. Please advise.

## 2023-06-08 NOTE — Telephone Encounter (Signed)
Patient calling in to clarify what dose of lasix patient should be on. They state she was in ED 06/03/23-06/07/23 for UTI, but she was also diuresed during that stay as her BP was very high when she was admitted. Patient correctly reports that discharge instructions state for furosemide 20 mg tablets state: " Take 2 tablets (40 mg total) by mouth 3 (three) times a week. M W F, and one tablet (20mg ) all other days".   Patient reports she has no SOB or leg swelling, HR is 64 and BP is 126/75 at the time of this call. She wants to know if Dr. Anne Fu feels this dose is appropriate for her or if she needs to go back up to 40 mg daily, which is what she was on prior to her hospitalization. Forwarded to Dr. Anne Fu.

## 2023-06-12 NOTE — Telephone Encounter (Signed)
Advised husband per Dr Anne Fu - ok to continue taking 20 mg once daily.  He states understanding.  They will f/u as scheduled 10/23.

## 2023-06-13 DIAGNOSIS — N390 Urinary tract infection, site not specified: Secondary | ICD-10-CM | POA: Diagnosis not present

## 2023-06-13 DIAGNOSIS — N1330 Unspecified hydronephrosis: Secondary | ICD-10-CM | POA: Diagnosis not present

## 2023-06-13 DIAGNOSIS — I340 Nonrheumatic mitral (valve) insufficiency: Secondary | ICD-10-CM | POA: Diagnosis not present

## 2023-06-13 DIAGNOSIS — M6289 Other specified disorders of muscle: Secondary | ICD-10-CM | POA: Diagnosis not present

## 2023-06-13 DIAGNOSIS — N139 Obstructive and reflux uropathy, unspecified: Secondary | ICD-10-CM | POA: Diagnosis not present

## 2023-06-13 DIAGNOSIS — Z6827 Body mass index (BMI) 27.0-27.9, adult: Secondary | ICD-10-CM | POA: Diagnosis not present

## 2023-06-13 DIAGNOSIS — D649 Anemia, unspecified: Secondary | ICD-10-CM | POA: Diagnosis not present

## 2023-06-13 DIAGNOSIS — G4734 Idiopathic sleep related nonobstructive alveolar hypoventilation: Secondary | ICD-10-CM | POA: Diagnosis not present

## 2023-06-13 DIAGNOSIS — Z978 Presence of other specified devices: Secondary | ICD-10-CM | POA: Diagnosis not present

## 2023-06-15 ENCOUNTER — Observation Stay (HOSPITAL_BASED_OUTPATIENT_CLINIC_OR_DEPARTMENT_OTHER)
Admission: EM | Admit: 2023-06-15 | Discharge: 2023-06-18 | Disposition: A | Discharge status: Home Health | Payer: Medicare Other | Attending: Emergency Medicine | Admitting: Emergency Medicine

## 2023-06-15 ENCOUNTER — Emergency Department (HOSPITAL_BASED_OUTPATIENT_CLINIC_OR_DEPARTMENT_OTHER): Payer: Medicare Other | Admitting: Radiology

## 2023-06-15 ENCOUNTER — Other Ambulatory Visit: Payer: Self-pay

## 2023-06-15 ENCOUNTER — Other Ambulatory Visit (HOSPITAL_BASED_OUTPATIENT_CLINIC_OR_DEPARTMENT_OTHER): Payer: Self-pay

## 2023-06-15 ENCOUNTER — Encounter (HOSPITAL_BASED_OUTPATIENT_CLINIC_OR_DEPARTMENT_OTHER): Payer: Self-pay | Admitting: Urology

## 2023-06-15 DIAGNOSIS — I10 Essential (primary) hypertension: Secondary | ICD-10-CM | POA: Diagnosis present

## 2023-06-15 DIAGNOSIS — E785 Hyperlipidemia, unspecified: Secondary | ICD-10-CM | POA: Diagnosis present

## 2023-06-15 DIAGNOSIS — I5033 Acute on chronic diastolic (congestive) heart failure: Secondary | ICD-10-CM | POA: Diagnosis present

## 2023-06-15 DIAGNOSIS — I130 Hypertensive heart and chronic kidney disease with heart failure and stage 1 through stage 4 chronic kidney disease, or unspecified chronic kidney disease: Secondary | ICD-10-CM | POA: Diagnosis not present

## 2023-06-15 DIAGNOSIS — I2510 Atherosclerotic heart disease of native coronary artery without angina pectoris: Secondary | ICD-10-CM | POA: Diagnosis present

## 2023-06-15 DIAGNOSIS — R0989 Other specified symptoms and signs involving the circulatory and respiratory systems: Secondary | ICD-10-CM | POA: Diagnosis not present

## 2023-06-15 DIAGNOSIS — N183 Chronic kidney disease, stage 3 unspecified: Secondary | ICD-10-CM | POA: Diagnosis present

## 2023-06-15 DIAGNOSIS — Z7901 Long term (current) use of anticoagulants: Secondary | ICD-10-CM | POA: Insufficient documentation

## 2023-06-15 DIAGNOSIS — I509 Heart failure, unspecified: Secondary | ICD-10-CM | POA: Diagnosis not present

## 2023-06-15 DIAGNOSIS — I5030 Unspecified diastolic (congestive) heart failure: Secondary | ICD-10-CM

## 2023-06-15 DIAGNOSIS — Z87891 Personal history of nicotine dependence: Secondary | ICD-10-CM | POA: Insufficient documentation

## 2023-06-15 DIAGNOSIS — Z853 Personal history of malignant neoplasm of breast: Secondary | ICD-10-CM | POA: Diagnosis not present

## 2023-06-15 DIAGNOSIS — I4821 Permanent atrial fibrillation: Secondary | ICD-10-CM | POA: Diagnosis not present

## 2023-06-15 DIAGNOSIS — J811 Chronic pulmonary edema: Secondary | ICD-10-CM | POA: Diagnosis not present

## 2023-06-15 DIAGNOSIS — D649 Anemia, unspecified: Secondary | ICD-10-CM | POA: Diagnosis present

## 2023-06-15 DIAGNOSIS — Z79899 Other long term (current) drug therapy: Secondary | ICD-10-CM | POA: Insufficient documentation

## 2023-06-15 DIAGNOSIS — R918 Other nonspecific abnormal finding of lung field: Secondary | ICD-10-CM | POA: Diagnosis not present

## 2023-06-15 DIAGNOSIS — D500 Iron deficiency anemia secondary to blood loss (chronic): Secondary | ICD-10-CM | POA: Insufficient documentation

## 2023-06-15 DIAGNOSIS — Z96653 Presence of artificial knee joint, bilateral: Secondary | ICD-10-CM | POA: Diagnosis not present

## 2023-06-15 DIAGNOSIS — I5042 Chronic combined systolic (congestive) and diastolic (congestive) heart failure: Principal | ICD-10-CM | POA: Insufficient documentation

## 2023-06-15 DIAGNOSIS — R0602 Shortness of breath: Secondary | ICD-10-CM | POA: Diagnosis present

## 2023-06-15 DIAGNOSIS — Z8673 Personal history of transient ischemic attack (TIA), and cerebral infarction without residual deficits: Secondary | ICD-10-CM

## 2023-06-15 DIAGNOSIS — N390 Urinary tract infection, site not specified: Secondary | ICD-10-CM | POA: Insufficient documentation

## 2023-06-15 DIAGNOSIS — J9601 Acute respiratory failure with hypoxia: Secondary | ICD-10-CM | POA: Insufficient documentation

## 2023-06-15 DIAGNOSIS — N1832 Chronic kidney disease, stage 3b: Secondary | ICD-10-CM | POA: Insufficient documentation

## 2023-06-15 DIAGNOSIS — I110 Hypertensive heart disease with heart failure: Secondary | ICD-10-CM | POA: Diagnosis not present

## 2023-06-15 DIAGNOSIS — J9811 Atelectasis: Secondary | ICD-10-CM | POA: Diagnosis not present

## 2023-06-15 LAB — URINALYSIS, ROUTINE W REFLEX MICROSCOPIC
Bacteria, UA: NONE SEEN
Bilirubin Urine: NEGATIVE
Glucose, UA: NEGATIVE mg/dL
Ketones, ur: NEGATIVE mg/dL
Leukocytes,Ua: NEGATIVE
Nitrite: NEGATIVE
Protein, ur: NEGATIVE mg/dL
RBC / HPF: >50 RBC/hpf (ref 0–5)
Specific Gravity, Urine: <1.005 — ABNORMAL LOW (ref 1.005–1.030)
pH: 7 (ref 5.0–8.0)

## 2023-06-15 LAB — CBC WITH DIFFERENTIAL/PLATELET
Abs Immature Granulocytes: 0.04 10*3/uL (ref 0.00–0.07)
Basophils Absolute: 0 10*3/uL (ref 0.0–0.1)
Basophils Relative: 0 %
Eosinophils Absolute: 0.1 10*3/uL (ref 0.0–0.5)
Eosinophils Relative: 1 %
HCT: 33.2 % — ABNORMAL LOW (ref 36.0–46.0)
Hemoglobin: 11 g/dL — ABNORMAL LOW (ref 12.0–15.0)
Immature Granulocytes: 0 %
Lymphocytes Relative: 15 %
Lymphs Abs: 1.5 10*3/uL (ref 0.7–4.0)
MCH: 31.2 pg (ref 26.0–34.0)
MCHC: 33.1 g/dL (ref 30.0–36.0)
MCV: 94.1 fL (ref 80.0–100.0)
Monocytes Absolute: 0.8 10*3/uL (ref 0.1–1.0)
Monocytes Relative: 8 %
Neutro Abs: 7.5 10*3/uL (ref 1.7–7.7)
Neutrophils Relative %: 76 %
Platelets: 286 10*3/uL (ref 150–400)
RBC: 3.53 MIL/uL — ABNORMAL LOW (ref 3.87–5.11)
RDW: 14.1 % (ref 11.5–15.5)
WBC: 10 10*3/uL (ref 4.0–10.5)
nRBC: 0 % (ref 0.0–0.2)

## 2023-06-15 LAB — BASIC METABOLIC PANEL
Anion gap: 7 (ref 5–15)
BUN: 20 mg/dL (ref 8–23)
CO2: 30 mmol/L (ref 22–32)
Calcium: 9.9 mg/dL (ref 8.9–10.3)
Chloride: 99 mmol/L (ref 98–111)
Creatinine, Ser: 1.08 mg/dL — ABNORMAL HIGH (ref 0.44–1.00)
GFR, Estimated: 48 mL/min — ABNORMAL LOW (ref >60)
Glucose, Bld: 103 mg/dL — ABNORMAL HIGH (ref 70–99)
Potassium: 4 mmol/L (ref 3.5–5.1)
Sodium: 136 mmol/L (ref 135–145)

## 2023-06-15 LAB — BRAIN NATRIURETIC PEPTIDE: B Natriuretic Peptide: 645.8 pg/mL — ABNORMAL HIGH (ref 0.0–100.0)

## 2023-06-15 MED ORDER — FUROSEMIDE 10 MG/ML IJ SOLN
40.0000 mg | Freq: Once | INTRAMUSCULAR | Status: AC
  Administered 2023-06-15: 40 mg via INTRAVENOUS
  Filled 2023-06-15: qty 4

## 2023-06-15 MED ORDER — APIXABAN 2.5 MG PO TABS
2.5000 mg | ORAL_TABLET | Freq: Two times a day (BID) | ORAL | Status: DC
  Administered 2023-06-16 – 2023-06-18 (×5): 2.5 mg via ORAL
  Filled 2023-06-15 (×5): qty 1

## 2023-06-15 MED ORDER — SODIUM CHLORIDE 0.9% FLUSH
3.0000 mL | Freq: Two times a day (BID) | INTRAVENOUS | Status: DC
  Administered 2023-06-15 – 2023-06-18 (×6): 3 mL via INTRAVENOUS

## 2023-06-15 MED ORDER — DILTIAZEM HCL 60 MG PO TABS
60.0000 mg | ORAL_TABLET | Freq: Three times a day (TID) | ORAL | Status: DC
  Administered 2023-06-15 – 2023-06-17 (×5): 60 mg via ORAL
  Filled 2023-06-15 (×5): qty 1

## 2023-06-15 MED ORDER — ALLOPURINOL 100 MG PO TABS
100.0000 mg | ORAL_TABLET | Freq: Every day | ORAL | Status: DC
  Administered 2023-06-16 – 2023-06-18 (×3): 100 mg via ORAL
  Filled 2023-06-15 (×3): qty 1

## 2023-06-15 MED ORDER — VITAMIN B-12 1000 MCG PO TABS
1000.0000 ug | ORAL_TABLET | Freq: Every day | ORAL | Status: DC
  Administered 2023-06-16 – 2023-06-18 (×3): 1000 ug via ORAL
  Filled 2023-06-15 (×3): qty 1

## 2023-06-15 MED ORDER — POLYETHYLENE GLYCOL 3350 17 G PO PACK
17.0000 g | PACK | Freq: Every day | ORAL | Status: DC
  Administered 2023-06-16: 17 g via ORAL
  Filled 2023-06-15 (×3): qty 1

## 2023-06-15 MED ORDER — VITAMIN B COMPLEX PO CAPS: 1.0000 | ORAL_CAPSULE | Freq: Every day | ORAL | Status: DC

## 2023-06-15 MED ORDER — CALCIUM CARBONATE ANTACID 500 MG PO CHEW
3.0000 | CHEWABLE_TABLET | Freq: Three times a day (TID) | ORAL | Status: DC
  Administered 2023-06-16 – 2023-06-17 (×3): 600 mg via ORAL
  Filled 2023-06-15 (×3): qty 3

## 2023-06-15 MED ORDER — UP4 PROBIOTICS WOMENS PO CAPS: ORAL_CAPSULE | Freq: Every day | ORAL | Status: DC

## 2023-06-15 MED ORDER — FUROSEMIDE 10 MG/ML IJ SOLN
40.0000 mg | Freq: Every day | INTRAMUSCULAR | Status: DC
  Administered 2023-06-15 – 2023-06-16 (×2): 40 mg via INTRAVENOUS
  Filled 2023-06-15 (×2): qty 4

## 2023-06-15 MED ORDER — ATORVASTATIN CALCIUM 10 MG PO TABS
10.0000 mg | ORAL_TABLET | Freq: Every day | ORAL | Status: DC
  Administered 2023-06-16 – 2023-06-18 (×3): 10 mg via ORAL
  Filled 2023-06-15 (×3): qty 1

## 2023-06-15 MED ORDER — METOPROLOL TARTRATE 100 MG PO TABS
100.0000 mg | ORAL_TABLET | Freq: Two times a day (BID) | ORAL | Status: DC
  Administered 2023-06-16 – 2023-06-18 (×4): 100 mg via ORAL
  Filled 2023-06-15 (×4): qty 1

## 2023-06-15 MED ORDER — SODIUM CHLORIDE 0.9 % IV SOLN: INTRAVENOUS | Status: DC

## 2023-06-15 MED ORDER — ACETAMINOPHEN 500 MG PO TABS: 1000.0000 mg | ORAL_TABLET | Freq: Two times a day (BID) | ORAL | Status: DC | PRN

## 2023-06-15 MED ORDER — ALBUTEROL SULFATE HFA 108 (90 BASE) MCG/ACT IN AERS: 1.0000 | INHALATION_SPRAY | Freq: Four times a day (QID) | RESPIRATORY_TRACT | Status: DC | PRN

## 2023-06-15 MED ORDER — CALCIUM CARBONATE 1500 (600 CA) MG PO TABS: 1500.0000 mg | ORAL_TABLET | Freq: Two times a day (BID) | ORAL | Status: DC

## 2023-06-15 MED ORDER — ONDANSETRON HCL 4 MG/2ML IJ SOLN
4.0000 mg | Freq: Four times a day (QID) | INTRAMUSCULAR | Status: DC | PRN
  Filled 2023-06-15: qty 2

## 2023-06-15 MED ORDER — ACETAMINOPHEN 325 MG PO TABS: 650.0000 mg | ORAL_TABLET | ORAL | Status: DC | PRN

## 2023-06-15 MED ORDER — DOCUSATE SODIUM 100 MG PO CAPS: 100.0000 mg | ORAL_CAPSULE | Freq: Every day | ORAL | Status: DC | PRN

## 2023-06-15 MED ORDER — VITAMIN C 500 MG PO TABS
500.0000 mg | ORAL_TABLET | Freq: Two times a day (BID) | ORAL | Status: DC
  Administered 2023-06-16 – 2023-06-18 (×5): 500 mg via ORAL
  Filled 2023-06-15 (×5): qty 1

## 2023-06-15 MED ORDER — SODIUM CHLORIDE 0.9 % IV SOLN: 250.0000 mL | INTRAVENOUS | Status: DC | PRN

## 2023-06-15 MED ORDER — FERROUS SULFATE 325 (65 FE) MG PO TABS
325.0000 mg | ORAL_TABLET | Freq: Every day | ORAL | Status: DC
  Administered 2023-06-16 – 2023-06-18 (×3): 325 mg via ORAL
  Filled 2023-06-15 (×4): qty 1

## 2023-06-15 MED ORDER — MORPHINE SULFATE (PF) 2 MG/ML IV SOLN
2.0000 mg | Freq: Once | INTRAVENOUS | Status: AC
  Administered 2023-06-15: 2 mg via INTRAVENOUS
  Filled 2023-06-15: qty 1

## 2023-06-15 MED ORDER — ALBUTEROL SULFATE HFA 108 (90 BASE) MCG/ACT IN AERS
2.0000 | INHALATION_SPRAY | RESPIRATORY_TRACT | Status: DC | PRN
  Administered 2023-06-15: 2 via RESPIRATORY_TRACT
  Filled 2023-06-15: qty 6.7

## 2023-06-15 MED ORDER — PANTOPRAZOLE SODIUM 40 MG PO TBEC
40.0000 mg | DELAYED_RELEASE_TABLET | Freq: Every day | ORAL | Status: DC
  Administered 2023-06-16 – 2023-06-18 (×3): 40 mg via ORAL
  Filled 2023-06-15 (×3): qty 1

## 2023-06-15 MED ORDER — AEROCHAMBER PLUS FLO-VU MEDIUM MISC
1.0000 | Freq: Once | Status: AC
  Administered 2023-06-15: 1
  Filled 2023-06-15: qty 1

## 2023-06-15 MED ORDER — ADULT MULTIVITAMIN W/MINERALS CH
ORAL_TABLET | Freq: Every day | ORAL | Status: DC
  Administered 2023-06-16 – 2023-06-18 (×3): 1 via ORAL
  Filled 2023-06-15 (×3): qty 1

## 2023-06-15 MED ORDER — SODIUM CHLORIDE 0.9% FLUSH: 3.0000 mL | INTRAVENOUS | Status: DC | PRN

## 2023-06-15 NOTE — ED Provider Notes (Signed)
[place] EMERGENCY DEPARTMENT AT [facility] Provider Note   CSN: [id number] Arrival date & time: 06/15/23  1426     History  Chief Complaint  Patient presents with   Dysuria   Shortness of Breath    Adrienne [name] is a 89 y.o. female.  This is a 89 year old female with history of CHF as well as recurrent UTIs presents with shortness of breath which has been present for a few days.  The patient notes a 5 pound weight increase.  Notes that her Lasix has been decreased by half.  Denies any chest pain or chest pressure.  No fever or chills.  Does note some dysuria and just finished a course of antibiotics 2 days ago.  She does have a chronic indwelling catheter.  States that when she exerts herself her dyspnea gets worse and its better with rest.  Has been compliant with her medications.       Home Medications Prior to Admission medications   Medication Sig Start Date End Date Taking? Authorizing Provider  docusate sodium (COLACE) 100 MG capsule Take 100 mg by mouth daily as needed. 06/10/23  Yes [provider]  acetaminophen (TYLENOL) 500 MG tablet Take 1,000 mg by mouth every 12 (twelve) hours as needed for mild pain.    [provider]  albuterol (VENTOLIN HFA) 108 (90 Base) MCG/ACT inhaler Inhale 1 puff into the lungs every 6 (six) hours as needed for wheezing or shortness of breath (Cough). 09/08/22   Regalado, Belkys A, MD  allopurinol (ZYLOPRIM) 100 MG tablet Take 1 tablet (100 mg total) by mouth daily. 09/08/22   Regalado, Belkys A, MD  apixaban (ELIQUIS) 2.5 MG TABS tablet Take 1 tablet (2.5 mg total) by mouth 2 (two) times daily. 10/07/22   Jake Bathe, MD  ascorbic acid (VITAMIN C) 500 MG tablet Take 500 mg by mouth 2 (two) times daily.    [provider]  atorvastatin (LIPITOR) 10 MG tablet TAKE 1 TABLET DAILY 11/28/22   Jake Bathe, MD  B Complex Vitamins (VITAMIN B COMPLEX) CAPS Take 1 capsule by mouth daily.     [provider]  calcium carbonate (OSCAL) 1500 (600 Ca) MG TABS tablet Take 1,500 mg by mouth 2 (two) times daily with a meal.    [provider]  cyanocobalamin (VITAMIN B12) 1000 MCG tablet Take 1,000 mcg by mouth daily.    [provider]  diltiazem (CARDIZEM) 60 MG tablet TAKE 1 TABLET THREE TIMES A DAY 11/28/22   Jake Bathe, MD  Ferrous Sulfate (IRON) 325 (65 Fe) MG TABS Take 325 mg by mouth daily. (OTC)    [provider]  furosemide (LASIX) 20 MG tablet Take 2 tablets (40 mg total) by mouth 3 (three) times a week. M W F, and one tablet (20mg ) all other days Patient taking differently: Take 20 mg by mouth daily. 06/05/23   Jake Bathe, MD  metoprolol tartrate (LOPRESSOR) 100 MG tablet TAKE 1 TABLET TWICE A DAY 11/28/22   Jake Bathe, MD  Multiple Vitamins-Minerals (MULTIVITAMIN WITH MINERALS) tablet Take 1 tablet by mouth daily.    [provider]  omeprazole (PRILOSEC) 10 MG capsule Take 10 mg by mouth every other day. 03/21/19   [provider]  polyethylene glycol (MIRALAX / GLYCOLAX) 17 g packet Take 17 g by mouth daily.    [provider]  Probiotic Product (UP4 PROBIOTICS WOMENS PO) Take 1 capsule by mouth daily with breakfast.  [provider]      Allergies    Valium [diazepam], Amiodarone hcl [amiodarone], Compazine [prochlorperazine], Other, Thorazine [chlorpromazine], Zometa [zoledronic acid], Ciprofloxacin, Doxycycline, Zoster vac recomb adjuvanted, and Iodinated contrast media    Review of Systems   Review of Systems  All other systems reviewed and are negative.   Physical Exam Updated Vital Signs BP (!) 133/59 (BP Location: Right Arm)   Pulse (!) 53   Temp 98.8 F (37.1 C)   Resp 18   Ht 1.499 m (4\' 11" )   Wt 56.7 kg   LMP 09/12/1973 (Approximate)   SpO2 96%   BMI 25.25 kg/m  Physical Exam Vitals and nursing note reviewed.  Constitutional:      General: She is not in acute  distress.    Appearance: Normal appearance. She is well-developed. She is not toxic-appearing.  HENT:     Head: Normocephalic and atraumatic.  Eyes:     General: Lids are normal.     Conjunctiva/sclera: Conjunctivae normal.     Pupils: Pupils are equal, round, and reactive to light.  Neck:     Thyroid: No thyroid mass.     Trachea: No tracheal deviation.  Cardiovascular:     Rate and Rhythm: Normal rate and regular rhythm.     Heart sounds: Normal heart sounds. No murmur heard.    No gallop.  Pulmonary:     Effort: Pulmonary effort is normal. No respiratory distress.     Breath sounds: No stridor. Examination of the right-upper field reveals decreased breath sounds. Examination of the left-upper field reveals decreased breath sounds. Decreased breath sounds present. No wheezing, rhonchi or rales.  Abdominal:     General: There is no distension.     Palpations: Abdomen is soft.     Tenderness: There is no abdominal tenderness. There is no rebound.  Musculoskeletal:        General: No tenderness. Normal range of motion.     Cervical back: Normal range of motion and neck supple.     Comments: 3+ bilateral lower extremity pitting edema  Skin:    General: Skin is warm and dry.     Findings: No abrasion or rash.  Neurological:     Mental Status: She is alert and oriented to person, place, and time. Mental status is at baseline.     GCS: GCS eye subscore is 4. GCS verbal subscore is 5. GCS motor subscore is 6.     Cranial Nerves: Cranial nerves are intact. No cranial nerve deficit.     Sensory: No sensory deficit.     Motor: Motor function is intact.  Psychiatric:        Attention and Perception: Attention normal.        Speech: Speech normal.        Behavior: Behavior normal.     ED Results / Procedures / Treatments   Labs (all labs ordered are listed, but only abnormal results are displayed) Labs Reviewed  CBC WITH DIFFERENTIAL/PLATELET  BRAIN NATRIURETIC PEPTIDE  BASIC  METABOLIC PANEL  URINALYSIS, ROUTINE W REFLEX MICROSCOPIC    EKG EKG Interpretation Date/Time:  Thursday June 15 2023 14:41:24 EDT Ventricular Rate:  66 PR Interval:    QRS Duration:  142 QT Interval:  442 QTC Calculation: 463 R Axis:   261  Text Interpretation: Atrial fibrillation with premature ventricular or aberrantly conducted complexes Non-specific intra-ventricular conduction block Abnormal ECG When compared with ECG of 04-May-2023 03:03, No significant change since last tracing  Confirmed by Linwood Dibbles [phone]) on 06/15/2023 2:44:13 PM  Radiology No results found.  Procedures Procedures    Medications Ordered in ED Medications  albuterol (VENTOLIN HFA) 108 (90 Base) MCG/ACT inhaler 2 puff (has no administration in time range)  0.9 %  sodium chloride infusion (has no administration in time range)    ED Course/ Medical Decision Making/ A&P                                 Medical Decision Making Amount and/or Complexity of Data Reviewed Labs: ordered. Radiology: ordered.  Risk Prescription drug management.   Patient is EKG per my interpretation shows A-fib.  This is unchanged from prior.  Chest x-ray consistent with pulmonary vascular congestion.  BNP elevated consistent with CHF.  Patient given Lasix 40 mg IV push.  Urinalysis is pending at this time.  Patient will require admission and will consult hospitalist team.  Discussed results with family who is at bedside        Final Clinical Impression(s) / ED Diagnoses Final diagnoses:  None    Rx / DC Orders ED Discharge Orders     None         Lorre Nick, MD 06/15/23 1701

## 2023-06-15 NOTE — ED Notes (Signed)
Adrienne [name] with cl called for transport 

## 2023-06-15 NOTE — ED Triage Notes (Signed)
Pt states concern for UTI that started 2 days ago  States burning and pain, Pt has Foley Cath in place  Also reports SOB with h/o CHF, takes lasix and recently decreased dose, took 40 mg today

## 2023-06-15 NOTE — H&P (Signed)
History and Physical    Patient: Adrienne [name] ZOX:[id number] DOB: 1927-10-03 DOA: 06/15/2023 DOS: the patient was seen and examined on 06/15/2023 PCP: Sigmund Hazel, MD  Patient coming from: Home  Chief Complaint:  Chief Complaint  Patient presents with   Dysuria   Shortness of Breath   HPI: Adrienne [name] is a 89 y.o. female with medical history significant of chronic indwelling Foley catheter secondary to pelvic floor prolapse, paroxysmal A-fib, remote history of breast cancer, GERD, essential hypertension, hyperlipidemia, pulmonary hypertension, chronic anticoagulation, diastolic dysfunction CHF with last echocardiogram in August who presents to the ER at [facility] with shortness of breath and hypoxia.  Oxygen sat was 89% on room air.  Patient apparently had her Lasix dose recently changed.  She was on 40 mg daily for a while which was changed to 20 mg 3 days a week and 40 mg the rest of the days.  She has not changed her diet.  Patient has apparently gained about 5 pounds in the last 1 week.  Was seen in the ER evaluated and found to have acute exacerbation of CHF.  Patient given IV Lasix 40 mg once.  Patient is already feeling better.  She has been admitted for further evaluation and treatment.  She had Foley catheter which is chronically indwelling that was switched in the ER.  Review of Systems: As mentioned in the history of present illness. All other systems reviewed and are negative. Past Medical History:  Diagnosis Date   Afib (HCC)    Arthritis of ankle    CAD (coronary artery disease)    Cancer (HCC) 2012   breast cancer-right   Compression fracture of T12 vertebra (HCC)    Diverticulosis    Edema    Elevated uric acid in blood    GERD (gastroesophageal reflux disease)    GI bleed 12/2015   Hyperlipidemia    Hypertension    Long-term (current) use of anticoagulants    Moderate mitral regurgitation    Personal history of radiation therapy     Pulmonary HTN (HCC)    Echo 1/19: EF 60-65, no RWMA, Ao sclerosis, trivial AI, mild MR, mod BAE, mod TR, PASP 49   Stroke (HCC) 01/25/2016   TIA (transient ischemic attack) 09/2011   Urinary incontinence    Urinary retention    Past Surgical History:  Procedure Laterality Date   APPENDECTOMY     AXILLARY SENTINEL NODE BIOPSY Left 01/22/2020   Procedure: Left Axillary Sentinel Node Biopsy;  Surgeon: Emelia Loron, MD;  Location: [facility] OR;  Service: General;  Laterality: Left;   BREAST LUMPECTOMY WITH RADIOACTIVE SEED AND SENTINEL LYMPH NODE BIOPSY Left 01/22/2020   Procedure: LEFT BREAST LUMPECTOMY WITH RADIOACTIVE SEED;  Surgeon: Emelia Loron, MD;  Location: [facility] OR;  Service: General;  Laterality: Left;   BREAST SURGERY  2012   Rt.mastectomy--[place], [place]   CARDIAC CATHETERIZATION  2013   CATARACT EXTRACTION, BILATERAL     CERVICAL CONIZATION W/BX N/A 08/08/2020   Procedure: VAGINAL SIDEWALL BIOPSY;  Surgeon: Jerene Bears, MD;  Location: [facility] OR;  Service: Gynecology;  Laterality: N/A;   DILATION AND CURETTAGE OF UTERUS     in her early 1's for DUB   ESOPHAGOGASTRODUODENOSCOPY Left 01/08/2016   Procedure: ESOPHAGOGASTRODUODENOSCOPY (EGD);  Surgeon: Willis Modena, MD;  Location: [facility];  Service: Endoscopy;  Laterality: Left;   GALLBLADDER SURGERY     MASTECTOMY Right 2012   RADIOLOGY WITH ANESTHESIA N/A 01/25/2016   Procedure: RADIOLOGY WITH ANESTHESIA;  Surgeon: Julieanne Cotton, MD;  Location: [facility] OR;  Service: Radiology;  Laterality: N/A;   REPLACEMENT TOTAL KNEE BILATERAL     TONSILLECTOMY AND ADENOIDECTOMY     as a child   Social History:  reports that she quit smoking about 74 years ago. Her smoking use included cigarettes. She started smoking about 76 years ago. She has a 0.5 pack-year smoking history. She has never used smokeless tobacco. She reports that she does not drink alcohol and does not use drugs.  Allergies  Allergen Reactions   Valium [Diazepam] Other  (See Comments)    HYPERACTIVITY    Amiodarone Hcl [Amiodarone] Other (See Comments)    A-Fib   Compazine [Prochlorperazine] Other (See Comments)    HYPERACTIVITY    Other Other (See Comments)    "Kidney dye"--patient states this gave her severe shakes/chills   Thorazine [Chlorpromazine] Other (See Comments)    HYPERACTIVITY    Zometa [Zoledronic Acid] Other (See Comments)    PROBLEMS WITH EYES  TIA   Ciprofloxacin Swelling and Other (See Comments)    "swelling" in Rt.elbow   Doxycycline Nausea Only   Zoster Vac Recomb Adjuvanted Other (See Comments)    Welt under injection site   Iodinated Contrast Media Anxiety and Other (See Comments)    Chills and anxiety  Other Reaction(s): chills/shaking, Kidney dye    Family History  Problem Relation Age of Onset   Asthma Mother    CVA Father    Atrial fibrillation Sister        HAS PACER   Pneumonia Brother    Cancer Daughter        COLON CANCER   Cancer Maternal Grandmother        breast cancer    Prior to Admission medications   Medication Sig Start Date End Date Taking? Authorizing Provider  docusate sodium (COLACE) 100 MG capsule Take 100 mg by mouth daily as needed. 06/10/23  Yes [provider]  acetaminophen (TYLENOL) 500 MG tablet Take 1,000 mg by mouth every 12 (twelve) hours as needed for mild pain.    [provider]  albuterol (VENTOLIN HFA) 108 (90 Base) MCG/ACT inhaler Inhale 1 puff into the lungs every 6 (six) hours as needed for wheezing or shortness of breath (Cough). 09/08/22   Regalado, Belkys A, MD  allopurinol (ZYLOPRIM) 100 MG tablet Take 1 tablet (100 mg total) by mouth daily. 09/08/22   Regalado, Belkys A, MD  apixaban (ELIQUIS) 2.5 MG TABS tablet Take 1 tablet (2.5 mg total) by mouth 2 (two) times daily. 10/07/22   Jake Bathe, MD  ascorbic acid (VITAMIN C) 500 MG tablet Take 500 mg by mouth 2 (two) times daily.    [provider]  atorvastatin (LIPITOR) 10 MG tablet TAKE 1  TABLET DAILY 11/28/22   Jake Bathe, MD  B Complex Vitamins (VITAMIN B COMPLEX) CAPS Take 1 capsule by mouth daily.    [provider]  calcium carbonate (OSCAL) 1500 (600 Ca) MG TABS tablet Take 1,500 mg by mouth 2 (two) times daily with a meal.    [provider]  cyanocobalamin (VITAMIN B12) 1000 MCG tablet Take 1,000 mcg by mouth daily.    [provider]  diltiazem (CARDIZEM) 60 MG tablet TAKE 1 TABLET THREE TIMES A DAY 11/28/22   Jake Bathe, MD  Ferrous Sulfate (IRON) 325 (65 Fe) MG TABS Take 325 mg by mouth daily. (OTC)    [provider]  furosemide (LASIX) 20 MG  tablet Take 2 tablets (40 mg total) by mouth 3 (three) times a week. M W F, and one tablet (20mg ) all other days Patient taking differently: Take 20 mg by mouth daily. 06/05/23   Jake Bathe, MD  metoprolol tartrate (LOPRESSOR) 100 MG tablet TAKE 1 TABLET TWICE A DAY 11/28/22   Jake Bathe, MD  Multiple Vitamins-Minerals (MULTIVITAMIN WITH MINERALS) tablet Take 1 tablet by mouth daily.    [provider]  omeprazole (PRILOSEC) 10 MG capsule Take 10 mg by mouth every other day. 03/21/19   [provider]  polyethylene glycol (MIRALAX / GLYCOLAX) 17 g packet Take 17 g by mouth daily.    [provider]  Probiotic Product (UP4 PROBIOTICS WOMENS PO) Take 1 capsule by mouth daily with breakfast.    [provider]    Physical Exam: Vitals:   06/15/23 1945 06/15/23 1959 06/15/23 2000 06/15/23 2050  BP:   (!) 147/61 (!) 149/73  Pulse: 70  72 97  Resp: (!) 21  (!) 21   Temp:  98.4 F (36.9 C)  98 F (36.7 C)  TempSrc:  Oral  Oral  SpO2: 91%  (!) 89% 93%  Weight:      Height:       Constitutional: NAD, calm, comfortable Eyes: PERRL, lids and conjunctivae normal ENMT: Mucous membranes are moist. Posterior pharynx clear of any exudate or lesions.Normal dentition.  Neck: normal, supple, no masses, no thyromegaly Respiratory: clear to auscultation  bilaterally, no wheezing, diffuse basal crackles. Normal respiratory effort. No accessory muscle use.  Cardiovascular: Regular rate and rhythm, no murmurs / rubs / gallops. No extremity edema. 2+ pedal pulses. No carotid bruits.  Abdomen: no tenderness, no masses palpated. No hepatosplenomegaly. Bowel sounds positive.  Musculoskeletal: Good range of motion, no joint swelling or tenderness, Skin: no rashes, lesions, ulcers. No induration Neurologic: CN 2-12 grossly intact. Sensation intact, DTR normal. Strength 5/5 in all 4.  Psychiatric: Normal judgment and insight. Alert and oriented x 3. Normal mood  Data Reviewed:  Blood pressure 147/61, pulse 70, oxygen sat 89% on room air , Glucose 103, creatinine 1.08.  Hemoglobin 11.0 urinalysis negative.  Chest x-ray showed cardiomegaly with pulmonary venous congestion and trace right pleural effusion.  Assessment and Plan:  #1 acute exacerbation of diastolic CHF: Patient will be admitted for observation and diuresis.  We will adjust her oral Lasix prior to discharge.  Continue other cardiac medications.  #2 recurrent UTI: Urinalysis negative today.  No antibiotics.  #3 chronic kidney disease stage IIIb: Stable.  Continue to monitor  #4 paroxysmal atrial fibrillation: Continue anticoagulation and rate control  #5 hyperlipidemia: Continue atorvastatin from home  #6 essential hypertension: Blood pressure better controlled.  #7 normocytic anemia: H&H is stable.    Advance Care Planning:   Code Status: Do not attempt resuscitation (DNR) - Comfort care   Consults: None  Family Communication: Husband at bedside  Severity of Illness: The appropriate patient status for this patient is OBSERVATION. Observation status is judged to be reasonable and necessary in order to provide the required intensity of service to ensure the patient's safety. The patient's presenting symptoms, physical exam findings, and initial radiographic and laboratory data in  the context of their medical condition is felt to place them at decreased risk for further clinical deterioration. Furthermore, it is anticipated that the patient will be medically stable for discharge from the hospital within 2 midnights of admission.   AuthorLonia Blood, MD 06/15/2023 10:38 PM  For on call review [email or web address].

## 2023-06-16 ENCOUNTER — Other Ambulatory Visit (HOSPITAL_COMMUNITY): Payer: Self-pay

## 2023-06-16 DIAGNOSIS — N1832 Chronic kidney disease, stage 3b: Secondary | ICD-10-CM | POA: Diagnosis not present

## 2023-06-16 DIAGNOSIS — I2510 Atherosclerotic heart disease of native coronary artery without angina pectoris: Secondary | ICD-10-CM | POA: Diagnosis not present

## 2023-06-16 DIAGNOSIS — I5033 Acute on chronic diastolic (congestive) heart failure: Secondary | ICD-10-CM | POA: Diagnosis not present

## 2023-06-16 DIAGNOSIS — D649 Anemia, unspecified: Secondary | ICD-10-CM

## 2023-06-16 DIAGNOSIS — I509 Heart failure, unspecified: Secondary | ICD-10-CM

## 2023-06-16 DIAGNOSIS — I2583 Coronary atherosclerosis due to lipid rich plaque: Secondary | ICD-10-CM | POA: Diagnosis not present

## 2023-06-16 DIAGNOSIS — E785 Hyperlipidemia, unspecified: Secondary | ICD-10-CM

## 2023-06-16 DIAGNOSIS — I10 Essential (primary) hypertension: Secondary | ICD-10-CM | POA: Diagnosis not present

## 2023-06-16 DIAGNOSIS — I5042 Chronic combined systolic (congestive) and diastolic (congestive) heart failure: Secondary | ICD-10-CM | POA: Diagnosis not present

## 2023-06-16 LAB — BASIC METABOLIC PANEL
Anion gap: 10 (ref 5–15)
BUN: 19 mg/dL (ref 8–23)
CO2: 31 mmol/L (ref 22–32)
Calcium: 8.8 mg/dL — ABNORMAL LOW (ref 8.9–10.3)
Chloride: 96 mmol/L — ABNORMAL LOW (ref 98–111)
Creatinine, Ser: 1.08 mg/dL — ABNORMAL HIGH (ref 0.44–1.00)
GFR, Estimated: 48 mL/min — ABNORMAL LOW (ref >60)
Glucose, Bld: 106 mg/dL — ABNORMAL HIGH (ref 70–99)
Potassium: 3.7 mmol/L (ref 3.5–5.1)
Sodium: 137 mmol/L (ref 135–145)

## 2023-06-16 MED ORDER — POTASSIUM CHLORIDE CRYS ER 20 MEQ PO TBCR
20.0000 meq | EXTENDED_RELEASE_TABLET | Freq: Every day | ORAL | Status: DC
  Administered 2023-06-17 – 2023-06-18 (×2): 20 meq via ORAL
  Filled 2023-06-16 (×2): qty 1

## 2023-06-16 MED ORDER — FUROSEMIDE 20 MG PO TABS
40.0000 mg | ORAL_TABLET | Freq: Every day | ORAL | 0 refills | Status: DC
Start: 2023-06-16 — End: 2023-11-16
  Filled 2023-06-16: qty 60, 30d supply, fill #0

## 2023-06-16 MED ORDER — POTASSIUM CHLORIDE CRYS ER 20 MEQ PO TBCR
20.0000 meq | EXTENDED_RELEASE_TABLET | Freq: Every day | ORAL | 0 refills | Status: AC
  Filled 2023-06-16: qty 30, 30d supply, fill #0

## 2023-06-16 MED ORDER — FUROSEMIDE 20 MG PO TABS: 40.0000 mg | ORAL_TABLET | Freq: Every day | ORAL | Status: DC

## 2023-06-16 MED ORDER — MUPIROCIN 2 % EX OINT
1.0000 | TOPICAL_OINTMENT | Freq: Two times a day (BID) | CUTANEOUS | Status: DC
  Administered 2023-06-16 – 2023-06-18 (×5): 1 via NASAL
  Filled 2023-06-16: qty 22

## 2023-06-16 MED ORDER — POTASSIUM CHLORIDE CRYS ER 20 MEQ PO TBCR
40.0000 meq | EXTENDED_RELEASE_TABLET | Freq: Once | ORAL | Status: AC
  Administered 2023-06-16: 40 meq via ORAL
  Filled 2023-06-16: qty 2

## 2023-06-16 MED ORDER — CHLORHEXIDINE GLUCONATE CLOTH 2 % EX PADS: 6.0000 | MEDICATED_PAD | Freq: Every day | CUTANEOUS | Status: DC

## 2023-06-16 MED ORDER — ALUM & MAG HYDROXIDE-SIMETH 200-200-20 MG/5ML PO SUSP
30.0000 mL | Freq: Four times a day (QID) | ORAL | Status: DC | PRN
  Filled 2023-06-16: qty 30

## 2023-06-16 MED ORDER — CHLORHEXIDINE GLUCONATE CLOTH 2 % EX PADS
6.0000 | MEDICATED_PAD | Freq: Every day | CUTANEOUS | Status: DC
  Administered 2023-06-16: 6 via TOPICAL

## 2023-06-16 NOTE — Assessment & Plan Note (Signed)
Cell count has been stable with Hgb at 11.

## 2023-06-16 NOTE — Discharge Summary (Addendum)
Physician Discharge Summary   Patient: Adrienne [name] MRN: [id number] DOB: [birth year]  Admit date:     06/15/2023  Discharge date: 06/17/23  Discharge Physician: York Ram Dallas Torok   PCP: Sigmund Hazel, MD   Recommendations at discharge:    Plan to increase furosemide to 40 mg po daily. Added Kcl supplements.  Follow up renal function in 7 days as outpatient.  To consider addition of spironolactone and ARB as outpatient for heart failure.  Follow up with Dr Hyacinth Meeker in 7 to 10 days.  Follow up with Cardiology as scheduled.   I spoke with patient's husband at the bedside, we talked in detail about patient's condition, plan of care and prognosis and all questions were addressed.   Discharge Diagnoses: Principal Problem:   Acute on chronic diastolic CHF (congestive heart failure) (HCC) Active Problems:   Heart failure with preserved ejection fraction (HCC)   Acute respiratory failure with hypoxia (HCC)   CKD (chronic kidney disease), stage IIIb (HCC)   Permanent atrial fibrillation (HCC)   Coronary artery disease due to lipid rich plaque   Hyperlipidemia   History of stroke   Essential hypertension   Normocytic anemia   CHF (congestive heart failure) (HCC)  Resolved Problems:   * No resolved hospital problems. [facility] Course: Mrs. Triano was admitted to the hospital with the working diagnosis of heart failure exacerbation.   89 yo female with the past medical history of paroxysmal atrial fibrillation, hypertension, pulmonary hypertension, heart failure, chronic urinary retention, who presented with dyspnea. Apparently her dose of furosemide was recently reduced from 40 mg daily to 20 mg for 3 days a week and 40 mg the other 4 days of the week. For the last 7 days prior to admission she gain 5 lbs, experiencing dyspnea.On her initial physical examination her 02 saturation was 89% on room air, blood pressure 147/61 and HR 72, RR 21. Lungs with rales at bases with  no wheezing or rhonchi, heart with S1 and S2 present and regular, no gallops or rubs, abdomen with no distention and no lower extremity edema.   Na 136, K 4,0 Cl 99, bicarbonate 30, glucose 103, bun 20 and cr 1,0  BNP 645  Wbc 10,0 hgb 11.0 plt 286  Urine analysis SG <1.005, negative protein, negative leukocytes, large Hgb. > 50 rbc, 0-5 wbc.   Chest radiograph with hyperinflation, with mild hilar vascular congestion, with no infiltrates or effusions.   EKG 66 bpm, right axis deviation, normal intervals, atrial flutter with q wave V1 and V2, with no significant ST segment or T wave changes.   Patient was placed on furosemide.   Assessment and Plan: * Acute on chronic diastolic CHF (congestive heart failure) (HCC) Echocardiogram with preserved LV systolic function with EF 60 to 65%, mild LVH, RV systolic function with mild reduction, RVSP 56.2. LA with moderate dilatation, RA with severe dilatation, moderate MR, moderate TR,   Patient was placed on IV furosemide, negative fluid balance was achieved, - 5,100 ml, with significant improvement in her symptoms.   Systolic blood pressure 120 to 122 mmHg.   Plan to continue diuresis with furosemide 40 mg po daily.  As outpatient spironolactone was held, and currently not on SGLT 2 inh due to recurrent urinary tract infections.  Continue metoprolol.  Hold on ARB due to risk of hypotension.   CKD (chronic kidney disease), stage IIIb (HCC) Renal function with serum cr at 1,0 with K at 3,7 and serum bicarbonate at 31.  Na 137   Plan to continue diuresis with furosemide and have close follow up renal function as outpatient.  Possible spironolactone as outpatient.   Chronic urinary retention, chronic indwelling foley cathter. Urinary tract infection has been ruled out.   06/03/23 renal CT with stable marked severity pelvic floor laxity with anterior and posterior compartment prolapse and subsequent partial obstruction of the left kidney secondary  to mass effect on the distal left ureter.  2mm non obstructing left renal calculus.   Follow up with urology as scheduled.   Permanent atrial fibrillation (HCC) Atrial flutter.,   Plan to continue rate control with diltiazem and metoprolol. Continue anticoagulation with apixaban.   Coronary artery disease due to lipid rich plaque No chest pain, ruled out acute coronary syndrome.  Plan to continue statin and blood pressure control.   Hyperlipidemia Continue statin therapy.   History of stroke Continue statin therapy and anticoagulation with apixaban.   Essential hypertension Continue metoprolol, possible addition of ARB as outpatient.   Normocytic anemia Cell count has been stable with Hgb at 11.          Consultants: none  Procedures performed: none   Disposition: Home Diet recommendation:  Discharge Diet Orders (From admission, onward)     Start     Ordered   06/16/23 0000  Diet - low sodium heart healthy        06/16/23 1400           Cardiac diet DISCHARGE MEDICATION: Allergies as of 06/16/2023       Reactions   Valium [diazepam] Other (See Comments)   HYPERACTIVITY   Amiodarone Hcl [amiodarone] Other (See Comments)   A-Fib   Compazine [prochlorperazine] Other (See Comments)   HYPERACTIVITY    Other Other (See Comments)   "Kidney dye"--patient states this gave her severe shakes/chills   Thorazine [chlorpromazine] Other (See Comments)   HYPERACTIVITY    Zometa [zoledronic Acid] Other (See Comments)   PROBLEMS WITH EYES TIA   Ciprofloxacin Swelling, Other (See Comments)   "swelling" in Rt.elbow   Doxycycline Nausea Only   Zoster Vac Recomb Adjuvanted Other (See Comments)   Welt under injection site   Iodinated Contrast Media Anxiety, Other (See Comments)   Chills and anxiety Other Reaction(s): chills/shaking, Kidney dye        Medication List     TAKE these medications    acetaminophen 500 MG tablet Commonly known as: TYLENOL Take  1,000 mg by mouth every 12 (twelve) hours as needed for mild pain.   albuterol 108 (90 Base) MCG/ACT inhaler Commonly known as: Ventolin HFA Inhale 1 puff into the lungs every 6 (six) hours as needed for wheezing or shortness of breath (Cough).   allopurinol 100 MG tablet Commonly known as: ZYLOPRIM Take 1 tablet (100 mg total) by mouth daily.   apixaban 2.5 MG Tabs tablet Commonly known as: ELIQUIS Take 1 tablet (2.5 mg total) by mouth 2 (two) times daily.   ascorbic acid 500 MG tablet Commonly known as: VITAMIN C Take 500 mg by mouth 2 (two) times daily.   atorvastatin 10 MG tablet Commonly known as: LIPITOR TAKE 1 TABLET DAILY   calcium carbonate 1500 (600 Ca) MG Tabs tablet Commonly known as: OSCAL Take 1,500 mg by mouth 2 (two) times daily with a meal.   cyanocobalamin 1000 MCG tablet Commonly known as: VITAMIN B12 Take 1,000 mcg by mouth daily.   diltiazem 60 MG tablet Commonly known as: CARDIZEM TAKE 1 TABLET THREE TIMES  A DAY   docusate sodium 100 MG capsule Commonly known as: COLACE Take 100 mg by mouth daily as needed.   furosemide 20 MG tablet Commonly known as: LASIX Take 2 tablets (40 mg total) by mouth daily. What changed:  when to take this additional instructions   Iron 325 (65 Fe) MG Tabs Take 325 mg by mouth daily. (OTC)   metoprolol tartrate 100 MG tablet Commonly known as: LOPRESSOR TAKE 1 TABLET TWICE A DAY   multivitamin with minerals tablet Take 1 tablet by mouth daily.   omeprazole 10 MG capsule Commonly known as: PRILOSEC Take 10 mg by mouth every other day.   polyethylene glycol 17 g packet Commonly known as: MIRALAX / GLYCOLAX Take 17 g by mouth daily.   potassium chloride SA 20 MEQ tablet Commonly known as: KLOR-CON M Take 1 tablet (20 mEq total) by mouth daily. Start taking on: June 17, 2023   UP4 PROBIOTICS WOMENS PO Take 1 capsule by mouth daily with breakfast.   Vitamin B Complex Caps Take 1 capsule by mouth  daily.        Discharge Exam: Filed Weights   06/15/23 1440 06/16/23 0407  Weight: 56.7 kg 57.5 kg   BP (!) 122/59 (BP Location: Left Arm)   Pulse 76   Temp 98.8 F (37.1 C) (Oral)   Resp 18   Ht 4\' 11"  (1.499 m)   Wt 57.5 kg   LMP 09/12/1973 (Approximate)   SpO2 98%   BMI 25.60 kg/m   Patient with no chest pain or dyspnea, no PND or orthopnea.   Neurology awake and alert ENT with mild pallor Cardiovascular with S1 and S2 present, irregularly irregular with positive systolic murmur at the apex, no gallops.  No JVD No lower extremity edema  Respiratory with no rales or wheezing, no rhonchi Abdomen with no distention   Condition at discharge: stable  The results of significant diagnostics from this hospitalization (including imaging, microbiology, ancillary and laboratory) are listed below for reference.   Imaging Studies: DG Chest 2 View  Result Date: 06/15/2023 CLINICAL DATA:  Shortness of breath.  Cough.  CHF. EXAM: CHEST - 2 VIEW COMPARISON:  06/05/2023 FINDINGS: Lateral view degraded by patient arm position. Patient rotated right on the frontal. Mild cardiomegaly. Atherosclerosis in the transverse aorta. Trace right pleural fluid or thickening blunts the costophrenic angle on the frontal. No left-sided pleural fluid or pneumothorax. Pulmonary venous congestion with improved interstitial edema. Increased right and persistent left base airspace disease. Surgical clips overlie the left breast. IMPRESSION: Cardiomegaly with pulmonary venous congestion. Trace right pleural fluid or thickening. Similar left and mild increase in right base airspace disease, favoring atelectasis. At the right lung base, early infection or aspiration cannot be excluded. Aortic Atherosclerosis (ICD10-I70.0). Electronically Signed   By: Jeronimo Greaves M.D.   On: 06/15/2023 16:45   DG CHEST PORT 1 VIEW  Result Date: 06/05/2023 CLINICAL DATA:  Congestive heart failure. EXAM: PORTABLE CHEST 1 VIEW  COMPARISON:  05/04/2023 FINDINGS: Mild cardiac enlargement. Aortic atherosclerotic calcifications. Mild diffuse increase interstitial markings and small pleural effusions compatible with mild CHF. No focal airspace consolidation. Surgical clips noted in the left axilla and left breast. No focal bone abnormality. IMPRESSION: Mild CHF. Aortic Atherosclerosis (ICD10-I70.0). Electronically Signed   By: Signa Kell M.D.   On: 06/05/2023 12:41   CT Renal Stone Study  Result Date: 06/03/2023 CLINICAL DATA:  Recent diagnosis of UTI with continued complications. EXAM: CT ABDOMEN AND PELVIS WITHOUT CONTRAST  TECHNIQUE: Multidetector CT imaging of the abdomen and pelvis was performed following the standard protocol without IV contrast. RADIATION DOSE REDUCTION: This exam was performed according to the departmental dose-optimization program which includes automated exposure control, adjustment of the mA and/or kV according to patient size and/or use of iterative reconstruction technique. COMPARISON:  March 23, 2023 and December 25, 2015 FINDINGS: Lower chest: There are small, stable bilateral pleural effusions. Hepatobiliary: No focal liver abnormality is seen. Status post cholecystectomy. No biliary dilatation. Pancreas: Unremarkable. No pancreatic ductal dilatation or surrounding inflammatory changes. Spleen: Normal in size without focal abnormality. Adrenals/Urinary Tract: Adrenal glands are unremarkable. Kidneys are normal in size, without focal lesions. A 2 mm nonobstructing renal calculus is seen within the mid left kidney. There is stable marked severity left-sided hydronephrosis and hydroureter which extends into an area of posterior pelvic floor prolapse. The urinary bladder is poorly distended and subsequently limited in evaluation. A Foley catheter is in place. Stomach/Bowel: Stomach is within normal limits. The appendix is surgically absent. No evidence of bowel wall thickening, distention, or inflammatory  changes. Numerous noninflamed diverticula are seen throughout the large bowel. Persistent chronic twisting of the mesentery is seen involving the mid to lower abdomen, to the left of midline (axial CT images 26 through 40, CT series 2). No associated abnormal bowel loops are identified. Vascular/Lymphatic: Aortic atherosclerosis. No enlarged abdominal or pelvic lymph nodes. Reproductive: Status post hysterectomy. No adnexal masses. Other: No abdominal wall hernia. There is stable, marked severity laxity of the pelvic floor with subsequent posterior compartment descent and prolapse of the rectum and anorectal junction. No abdominopelvic ascites. Musculoskeletal: Marked severity multilevel degenerative changes are seen. IMPRESSION: 1. Stable marked severity pelvic floor laxity with anterior and posterior compartment prolapse and subsequent partial obstruction of the left kidney secondary to mass effect on the distal left ureter. 2. 2 mm nonobstructing left renal calculus. 3. Colonic diverticulosis. 4. Small, stable bilateral pleural effusions. 5. Evidence of prior cholecystectomy, appendectomy and hysterectomy. 6. Aortic atherosclerosis. Aortic Atherosclerosis (ICD10-I70.0). Electronically Signed   By: Aram Candela M.D.   On: 06/03/2023 19:50    Microbiology: Results for orders placed or performed during the hospital encounter of 06/03/23  Urine Culture     Status: Abnormal   Collection Time: 06/03/23  6:07 PM   Specimen: Urine, Clean Catch  Result Value Ref Range Status   Specimen Description   Final    URINE, CLEAN CATCH Performed at [facility], [address], [place], [place] [address]    Special Requests   Final    NONE Performed at [facility], [address], [place], [place] [address]    Culture MULTIPLE SPECIES PRESENT, SUGGEST RECOLLECTION (A)  Final   Report Status 06/04/2023 FINAL  Final  MRSA Next Gen by PCR, Nasal     Status: Abnormal    Collection Time: 06/05/23 11:40 AM   Specimen: Nasal Mucosa; Nasal Swab  Result Value Ref Range Status   MRSA by PCR Next Gen DETECTED (A) NOT DETECTED Final    Comment: (NOTE) The GeneXpert MRSA Assay (FDA approved for NASAL specimens only), is one component of a comprehensive MRSA colonization surveillance program. It is not intended to diagnose MRSA infection nor to guide or monitor treatment for MRSA infections. Test performance is not FDA approved in patients less than 36 years old. Performed at [facility], [address], [place], [place] [address]     Labs: CBC: Recent Labs  Lab 06/15/23  1545  WBC 10.0  NEUTROABS 7.5  HGB 11.0*  HCT 33.2*  MCV 94.1  PLT 286   Basic Metabolic Panel: Recent Labs  Lab 06/15/23 1545 06/16/23 0327  NA 136 137  K 4.0 3.7  CL 99 96*  CO2 30 31  GLUCOSE 103* 106*  BUN 20 19  CREATININE 1.08* 1.08*  CALCIUM 9.9 8.8*   Liver Function Tests: No results for input(s): "AST", "ALT", "ALKPHOS", "BILITOT", "PROT", "ALBUMIN" in the last 168 hours. CBG: No results for input(s): "GLUCAP" in the last 168 hours.  Discharge time spent: greater than 30 minutes.  Signed: Coralie Keens, MD [facility] 06/16/2023

## 2023-06-16 NOTE — TOC Initial Note (Addendum)
Transition of Care (TOC) - Initial/Assessment Note  From home with spouse, has PCP and insurance on file, states has Yevonne Pax that starts on 10/21 , she is active with [facility] for HHPT, would not like to continue with   HHPT right now because she has renovations going on in her home.  NCM confirmed with Frances Furbish  Kandee Keen) and informed him of this information. She has home oxygen 2 liters at night with adapt and a walker and a rollator.    States daughter will transport her  home at [employer] and she is support system, states gets medications from [facility] in [place].  Pta ambulatory with walker.   Patient Details  Name: Adrienne [name] MRN: [id number] Date of Birth: 1927-10-28  Transition of Care [facility]) CM/SW Contact:    Leone Haven, RN Phone Number: 06/16/2023, 1:27 PM  Clinical Narrative:                   Expected Discharge Plan: Home w Home Health Services Barriers to Discharge: Continued Medical Work up   Patient Goals and CMS Choice Patient states their goals for this hospitalization and ongoing recovery are:: return home CMS Medicare.gov Compare Post Acute Care list provided to:: Patient Choice offered to / list presented to : Patient      Expected Discharge Plan and Services In-house Referral: NA Discharge Planning Services: CM Consult Post Acute Care Choice: Home Health Living arrangements for the past 2 months: Single Family Home                 DME Arranged: N/A DME Agency: NA             Prior Living Arrangements/Services Living arrangements for the past 2 months: Single Family Home Lives with:: Spouse Patient language and need for interpreter reviewed:: Yes Do you feel safe going back to the place where you live?: Yes      Need for Family Participation in Patient Care: Yes (Comment) Care giver support system in place?: Yes (comment) Current home services:  (walker, oxygen 2 liters at night with adapt) Criminal Activity/Legal Involvement  Pertinent to Current Situation/Hospitalization: No - Comment as needed  Activities of Daily Living   ADL Screening (condition at time of admission) Independently performs ADLs?: No Does the patient have a NEW difficulty with bathing/dressing/toileting/self-feeding that is expected to last >3 days?: No Does the patient have a NEW difficulty with getting in/out of bed, walking, or climbing stairs that is expected to last >3 days?: No Does the patient have a NEW difficulty with communication that is expected to last >3 days?: No Is the patient deaf or have difficulty hearing?: Yes Does the patient have difficulty seeing, even when wearing glasses/contacts?: No Does the patient have difficulty concentrating, remembering, or making decisions?: Yes  Permission Sought/Granted Permission sought to share information with : Case Manager Permission granted to share information with : Yes, Verbal Permission Granted     Permission granted to share info w AGENCY: [facility]        Emotional Assessment Appearance:: Appears stated age Attitude/Demeanor/Rapport: Engaged Affect (typically observed): Appropriate Orientation: : Oriented to Self, Oriented to Place, Oriented to  Time, Oriented to Situation Alcohol / Substance Use: Not Applicable Psych Involvement: No (comment)  Admission diagnosis:  CHF (congestive heart failure) (HCC) [I50.9] Congestive heart failure, unspecified HF chronicity, unspecified heart failure type [facility]) [I50.9] Patient Active Problem List   Diagnosis Date Noted   CHF (congestive heart failure) (HCC) 06/15/2023   UTI (  urinary tract infection) 06/03/2023   Cerebrovascular disease 05/05/2023   (HFpEF) heart failure with preserved ejection fraction (HCC) 05/04/2023   Hypokalemia 05/04/2023   Normocytic anemia 05/04/2023   Mitral regurgitation 04/20/2023   Hydronephrosis of left kidney 03/24/2023   Acute on chronic diastolic CHF (congestive heart failure) (HCC) 03/23/2023    CKD (chronic kidney disease), stage IIIb (HCC) 03/23/2023   UTI (urinary tract infection) due to urinary indwelling catheter (HCC) 02/04/2023   Hyponatremia 12/16/2022   AKI (acute kidney injury) (HCC) 12/16/2022   Acute cystitis with hydronephrosis due to indwelling foley catheter 12/15/2022   Acute respiratory failure with hypoxia (HCC) 09/03/2022   CAP (community acquired pneumonia) 09/03/2022   Catheter-associated urinary tract infection (HCC) 09/03/2022   Open knee wound 10/05/2021   Heart failure with preserved ejection fraction (HCC) 08/18/2021   Palliative care by specialist    Goals of care, counseling/discussion    DNR (do not resuscitate) discussion    Vaginal bleeding 08/08/2020   Uterine prolapse 08/08/2020   Vaginal erosion secondary to pessary use (HCC) 08/08/2020   Acute urinary retention 08/08/2020   Chronic respiratory failure (HCC) 08/08/2020   Multiple pulmonary nodules 05/24/2020   Cough 05/24/2020   Malignant neoplasm of upper-inner quadrant of left breast in female, estrogen receptor negative (HCC) 12/20/2019   Malignant neoplasm of overlapping sites of right breast in female, estrogen receptor positive (HCC) 08/14/2019   Osteopenia 08/14/2019   Diplopia 01/29/2016   GERD (gastroesophageal reflux disease) 01/29/2016   Cerebrovascular accident (CVA) due to embolism of left middle cerebral artery (HCC) 01/29/2016   Bleeding gastrointestinal    Gait disturbance, post-stroke 01/27/2016   Fall    Secondary hypertension, unspecified    Cerebral infarction due to thrombosis of left middle cerebral artery (HCC) s/p mechanical thrombectomy    Malnutrition of moderate degree 01/09/2016   GI bleed 01/06/2016   Permanent atrial fibrillation (HCC) 06/10/2015   Chronic anticoagulation 06/10/2015   Hyperlipidemia 06/10/2015   Essential hypertension 06/10/2015   History of stroke 06/10/2015   Coronary artery disease due to lipid rich plaque 06/10/2015   Elevated uric  acid in blood    Arthritis of ankle    Pulmonary HTN (HCC)    PCP:  Sigmund Hazel, MD Pharmacy:   [facility] [phone] - [place],  - [address] AT [facility] [address] [place] [address] Phone: [phone] Fax: [phone]  [facility] [address] [place] [address] Phone: [phone] Fax: [phone]  [facility] [address] [place] [address] Phone: [phone] Fax: [phone]     Social Determinants of Health (SDOH) Social History: SDOH Screenings   Food Insecurity: No Food Insecurity (06/15/2023)  Housing: Patient Declined (06/15/2023)  Transportation Needs: No Transportation Needs (06/15/2023)  Utilities: Not At Risk (06/15/2023)  Tobacco Use: Medium Risk (06/15/2023)   SDOH Interventions:     Readmission Risk Interventions    06/16/2023    1:21 PM 06/07/2023   10:15 AM 05/05/2023    5:19 PM  Readmission Risk Prevention Plan  Transportation Screening Complete Complete Complete  Medication Review Oceanographer) Complete Complete Complete  PCP or Specialist appointment within 3-5 days of discharge  Complete   HRI or Home Care Consult Complete Complete Complete  SW Recovery Care/Counseling Consult  Complete   Palliative Care Screening Not Applicable Not Applicable Not Applicable  Skilled Nursing Facility Not Applicable  Not Applicable Not Applicable

## 2023-06-16 NOTE — Care Management Obs Status (Signed)
MEDICARE OBSERVATION STATUS NOTIFICATION   Patient Details  Name: Adrienne [name] MRN: [id number] Date of Birth: [birth year]   Medicare Observation Status Notification Given:       Lockie Pares, RN 06/16/2023, 2:43 PM

## 2023-06-16 NOTE — Assessment & Plan Note (Addendum)
Renal function with serum cr at 1,0 with K at 3,7 and serum bicarbonate at 31. Na 137   Plan to continue diuresis with furosemide and have close follow up renal function as outpatient.  Possible spironolactone as outpatient.   Chronic urinary retention, chronic indwelling foley cathter. Urinary tract infection has been ruled out.   06/03/23 renal CT with stable marked severity pelvic floor laxity with anterior and posterior compartment prolapse and subsequent partial obstruction of the left kidney secondary to mass effect on the distal left ureter.  2mm non obstructing left renal calculus.   Follow up with urology as scheduled.

## 2023-06-16 NOTE — Evaluation (Signed)
Occupational Therapy Evaluation Patient Details Name: Adrienne [name] MRN: [id number] DOB: Apr 13, 1928 Today's Date: 06/16/2023   History of Present Illness Pt is a 89 y.o. female presenting with concern for UTI, 5lb weight increase, and SHOB. Chronic Indwelling foley secondary to pelvic floor prolapse. PMH:  HTN, HLD, afib, CAD, HFpEF, mitral regurgitation, CVA, CKD 3B,  recurrent UTI, Breast CA.   Clinical Impression   PTA, pt lived with her husband. Pt and husband have aide who assists with home IADL at baseline and daughter provides transportation. Upon eval, pt with decreased strength and activity tolerance. Needing up to supervision for safety during ADL. Pt needing assist for catheter management this session, but reports she has leg bag at home. Pt reports she has been using RW in shower for support at home; unsure safety of this method; recommending shower seat to provide safe alternative as pt with poor activity tolerance and unable to stand for appropriate length of time safely. Recommending discharge home with HHOT to optimize safety and independence with ADL and IADL.       If plan is discharge home, recommend the following: A little help with walking and/or transfers;A little help with bathing/dressing/bathroom;Assistance with cooking/housework;Help with stairs or ramp for entrance;Assist for transportation    Functional Status Assessment  Patient has had a recent decline in their functional status and demonstrates the ability to make significant improvements in function in a reasonable and predictable amount of time.  Equipment Recommendations  Tub/shower seat    Recommendations for Other Services       Precautions / Restrictions Precautions Precautions: Fall Precaution Comments: chronic foley catheter Restrictions Weight Bearing Restrictions: No      Mobility Bed Mobility Overal bed mobility: Modified Independent             General bed mobility  comments: increased time    Transfers Overall transfer level: Needs assistance Equipment used: Rolling walker (2 wheels) Transfers: Sit to/from Stand Sit to Stand: Supervision           General transfer comment: distant supervision      Balance Overall balance assessment: Mild deficits observed, not formally tested                                         ADL either performed or assessed with clinical judgement   ADL Overall ADL's : Needs assistance/impaired Eating/Feeding: Independent   Grooming: Wash/dry hands;Supervision/safety;Standing   Upper Body Bathing: Modified independent;Sitting   Lower Body Bathing: Supervison/ safety;Sit to/from stand   Upper Body Dressing : Modified independent;Sitting   Lower Body Dressing: Supervision/safety;Sit to/from stand   Toilet Transfer: Supervision/safety;Rolling walker (2 wheels);Ambulation           Functional mobility during ADLs: Supervision/safety       Vision Baseline Vision/History: 1 Wears glasses Ability to See in Adequate Light: 0 Adequate Patient Visual Report: No change from baseline Vision Assessment?: No apparent visual deficits     Perception         Praxis         Pertinent Vitals/Pain Pain Assessment Pain Assessment: No/denies pain     Extremity/Trunk Assessment Upper Extremity Assessment Upper Extremity Assessment: Overall WFL for tasks assessed   Lower Extremity Assessment Lower Extremity Assessment: Generalized weakness   Cervical / Trunk Assessment Cervical / Trunk Assessment: Kyphotic   Communication Communication Communication: No apparent difficulties   Cognition Arousal:  Alert Behavior During Therapy: [facility] for tasks assessed/performed Overall Cognitive Status: Within Functional Limits for tasks assessed                                       General Comments  denies use of daytime O2 at home. Pt did become slightly winded with mobility to  restroom. SpO2 92    Exercises     Shoulder Instructions      Home Living Family/patient expects to be discharged to:: Private residence Living Arrangements: Spouse/significant other Available Help at Discharge: Family;Available 24 hours/day Type of Home: House Home Access: Level entry     Home Layout: One level     Bathroom Shower/Tub: Producer, television/film/video: Standard     Home Equipment: Rollator (4 wheels);Rolling Walker (2 wheels);Other (comment) (O2 night time only)   Additional Comments: daughter provides transportation      Prior Functioning/Environment Prior Level of Function : Independent/Modified Independent;Needs assist             Mobility Comments: pt reports occasional RW or rollator in the home and sometimes no AD; pt reports RW or rollator outside the home ADLs Comments: pt reports ind with self care needing seated rest breaks, assistance with household chores of cooking/cleaning MWF for 4 hours/day        OT Problem List: Decreased strength;Impaired balance (sitting and/or standing);Decreased activity tolerance      OT Treatment/Interventions: Self-care/ADL training;Therapeutic exercise;DME and/or AE instruction;Balance training;Patient/family education;Therapeutic activities    OT Goals(Current goals can be found in the care plan section) Acute Rehab OT Goals Patient Stated Goal: get better OT Goal Formulation: With patient Time For Goal Achievement: 06/30/23 Potential to Achieve Goals: Good  OT Frequency: Min 1X/week    Co-evaluation              AM-PAC OT "6 Clicks" Daily Activity     Outcome Measure Help from another person eating meals?: None Help from another person taking care of personal grooming?: None Help from another person toileting, which includes using toliet, bedpan, or urinal?: A Little Help from another person bathing (including washing, rinsing, drying)?: A Little Help from another person to put on and  taking off regular upper body clothing?: None Help from another person to put on and taking off regular lower body clothing?: A Little 6 Click Score: 21   End of Session Equipment Utilized During Treatment: Rolling walker (2 wheels) Nurse Communication: Mobility status  Activity Tolerance: Patient tolerated treatment well Patient left: in bed;with call bell/phone within reach;with family/visitor present  OT Visit Diagnosis: Unsteadiness on feet (R26.81);Muscle weakness (generalized) (M62.81)                Time: 6213-0865 OT Time Calculation (min): 35 min Charges:  OT General Charges $OT Visit: 1 Visit OT Evaluation $OT Eval Low Complexity: 1 Low OT Treatments $Self Care/Home Management : 8-22 mins  Adrienne [name], OTR/L [facility] Acute Rehabilitation Office: [phone]   Adrienne [name] 06/16/2023, 2:10 PM

## 2023-06-16 NOTE — Assessment & Plan Note (Signed)
No chest pain, ruled out acute coronary syndrome.  Plan to continue statin and blood pressure control.

## 2023-06-16 NOTE — Hospital Course (Addendum)
Adrienne [name] was admitted to the hospital with the working diagnosis of heart failure exacerbation.   89 yo female with the past medical history of paroxysmal atrial fibrillation, hypertension, pulmonary hypertension, heart failure, chronic urinary retention, who presented with dyspnea.  08/09 At her outpatient cardiology follow up her dose of furosemide was recently increased to 40 mg daily 3 days a week and keep 20 mg the other 4 days of the week.  09/21 to 09/25 she was hospitalized for complicated urinary tract infection.   For the last 7 days prior to admission she gain 5 lbs, experiencing dyspnea.On her initial physical examination her 02 saturation was 89% on room air, blood pressure 147/61 and HR 72, RR 21. Lungs with rales at bases with no wheezing or rhonchi, heart with S1 and S2 present and regular, no gallops or rubs, abdomen with no distention and no lower extremity edema.   Na 136, K 4,0 Cl 99, bicarbonate 30, glucose 103, bun 20 and cr 1,0  BNP 645  Wbc 10,0 hgb 11.0 plt 286  Urine analysis SG <1.005, negative protein, negative leukocytes, large Hgb. > 50 rbc, 0-5 wbc.   Chest radiograph with hyperinflation, with mild hilar vascular congestion, with no infiltrates or effusions.   EKG 66 bpm, right axis deviation, normal intervals, atrial flutter with q wave V1 and V2, with no significant ST segment or T wave changes.   Patient was placed on furosemide.

## 2023-06-16 NOTE — Care Management CC44 (Signed)
Condition Code 44 Documentation Completed  Patient Details  Name: Adrienne [name] MRN: [id number] Date of Birth: Sep 09, 1928   Condition Code 44 given:  Yes Patient signature on Condition Code 44 notice:  Yes Documentation of 2 MD's agreement:  Yes Code 44 added to claim:  Yes    Lockie Pares, RN 06/16/2023, 2:43 PM

## 2023-06-16 NOTE — Evaluation (Signed)
Physical Therapy Evaluation Patient Details Name: Adrienne [name] MRN: [id number] DOB: 06-27-1928 Today's Date: 06/16/2023  History of Present Illness  Pt is a 89 y.o. female presenting with concern for UTI, 5lb weight increase, and SHOB. Chronic Indwelling foley secondary to pelvic floor prolapse. PMH:  HTN, HLD, afib, CAD, HFpEF, mitral regurgitation, CVA, CKD 3B,  recurrent UTI, Breast CA.  Clinical Impression   Pt presents with generalized weakness, impaired balance, dyspnea on exertion with accompanying desat, and decreased activity tolerance. Pt to benefit from acute PT to address deficits. Pt ambulated hallway distance with use of RW, overall supervision for mobility at this time. Pt with SPO2 drop to 87% on RA during gait requiring supplemental O2 to recover, RN aware. PT to progress mobility as tolerated, and will continue to follow acutely.     SATURATION QUALIFICATIONS: (This note is used to comply with regulatory documentation for home oxygen)  Patient Saturations on Room Air at Rest = 94%  Patient Saturations on Room Air while Ambulating = 87%  Patient Saturations on 2 Liters of oxygen while Ambulating = 94%  Please briefly explain why patient needs home oxygen: to maintain SPO2 >88%      If plan is discharge home, recommend the following: A little help with walking and/or transfers;A little help with bathing/dressing/bathroom;Assistance with cooking/housework;Assist for transportation   Can travel by private vehicle        Equipment Recommendations None recommended by PT  Recommendations for Other Services       Functional Status Assessment Patient has had a recent decline in their functional status and demonstrates the ability to make significant improvements in function in a reasonable and predictable amount of time.     Precautions / Restrictions Precautions Precautions: Fall Precaution Comments: chronic foley catheter Restrictions Weight Bearing  Restrictions: No      Mobility  Bed Mobility Overal bed mobility: Modified Independent             General bed mobility comments: increased time    Transfers Overall transfer level: Needs assistance Equipment used: Rolling walker (2 wheels) Transfers: Sit to/from Stand Sit to Stand: Supervision           General transfer comment: safety    Ambulation/Gait Ambulation/Gait assistance: Supervision Gait Distance (Feet): 100 Feet Assistive device: Rolling walker (2 wheels) Gait Pattern/deviations: Step-through pattern, Decreased stride length, Trunk flexed Gait velocity: decr     General Gait Details: cues for upright posture and proximity of RW  Stairs            Wheelchair Mobility     Tilt Bed    Modified Rankin (Stroke Patients Only)       Balance Overall balance assessment: Mild deficits observed, not formally tested                                           Pertinent Vitals/Pain Pain Assessment Pain Assessment: No/denies pain    Home Living Family/patient expects to be discharged to:: Private residence Living Arrangements: Spouse/significant other Available Help at Discharge: Family;Available 24 hours/day Type of Home: House Home Access: Level entry       Home Layout: One level Home Equipment: Rollator (4 wheels);Rolling Walker (2 wheels);Other (comment) (O2 night time only) Additional Comments: daughter provides transportation    Prior Function Prior Level of Function : Independent/Modified Independent;Needs assist  Mobility Comments: pt reports occasional RW or rollator in the home and sometimes no AD; pt reports RW or rollator outside the home ADLs Comments: pt reports ind with self care needing seated rest breaks, assistance with household chores of cooking/cleaning MWF for 4 hours/day     Extremity/Trunk Assessment   Upper Extremity Assessment Upper Extremity Assessment: Defer to OT  evaluation    Lower Extremity Assessment Lower Extremity Assessment: Generalized weakness    Cervical / Trunk Assessment Cervical / Trunk Assessment: Kyphotic  Communication   Communication Communication: No apparent difficulties  Cognition Arousal: Alert Behavior During Therapy: WFL for tasks assessed/performed Overall Cognitive Status: Within Functional Limits for tasks assessed                                          General Comments General comments (skin integrity, edema, etc.): SpO2 drop to 87% on RA during gait, requiring 2LO2 to recover    Exercises     Assessment/Plan    PT Assessment Patient needs continued PT services  PT Problem List Decreased strength;Decreased activity tolerance;Decreased balance;Pain;Decreased mobility       PT Treatment Interventions DME instruction;Gait training;Functional mobility training;Therapeutic activities;Therapeutic exercise;Balance training;Patient/family education    PT Goals (Current goals can be found in the Care Plan section)  Acute Rehab PT Goals Patient Stated Goal: go home PT Goal Formulation: With patient/family Time For Goal Achievement: 06/30/23 Potential to Achieve Goals: Good    Frequency Min 1X/week     Co-evaluation               AM-PAC PT "6 Clicks" Mobility  Outcome Measure Help needed turning from your back to your side while in a flat bed without using bedrails?: None Help needed moving from lying on your back to sitting on the side of a flat bed without using bedrails?: None Help needed moving to and from a bed to a chair (including a wheelchair)?: A Little Help needed standing up from a chair using your arms (e.g., wheelchair or bedside chair)?: A Little Help needed to walk in hospital room?: A Little Help needed climbing 3-5 steps with a railing? : A Little 6 Click Score: 20    End of Session Equipment Utilized During Treatment: Oxygen Activity Tolerance: Patient  tolerated treatment well Patient left: in bed;with call bell/phone within reach;with bed alarm set;with family/visitor present Nurse Communication: Mobility status PT Visit Diagnosis: Other abnormalities of gait and mobility (R26.89);Muscle weakness (generalized) (M62.81)    Time: 1478-2956 PT Time Calculation (min) (ACUTE ONLY): 23 min   Charges:   PT Evaluation $PT Eval Low Complexity: 1 Low   PT General Charges $$ ACUTE PT VISIT: 1 Visit         Marye Round, PT DPT Acute Rehabilitation Services Secure Chat Preferred  Office [phone]   Lizbet Cirrincione E Stroup 06/16/2023, 3:12 PM

## 2023-06-16 NOTE — Progress Notes (Signed)
Heart Failure Navigator Progress Note  Assessed for Heart & Vascular TOC clinic readiness.  Patient does not meet criteria due to EF 50-55% has [facility] appointment scheduled for 07/05/2023. .   Navigator will sign off at this time.   Rhae Hammock, BSN, Scientist, clinical (histocompatibility and immunogenetics) Only

## 2023-06-16 NOTE — Progress Notes (Signed)
Patient had 02 desaturation on ambulation to 87%, will add daytime 02 on exertion 2 L/min I spoke with Mrs. Gittens about this change and will plan to observe 02 saturations the rest of the day with plan for discharge tomorrow morning if she continue to be stable with no further increase in 02 requirements.

## 2023-06-16 NOTE — Assessment & Plan Note (Signed)
Echocardiogram with preserved LV systolic function with EF 60 to 65%, mild LVH, RV systolic function with mild reduction, RVSP 56.2. LA with moderate dilatation, RA with severe dilatation, moderate MR, moderate TR,   Patient was placed on IV furosemide, negative fluid balance was achieved, - 5,100 ml, with significant improvement in her symptoms.   Systolic blood pressure 120 to 122 mmHg.   Plan to continue diuresis with furosemide 40 mg po daily.  As outpatient spironolactone was held, and currently not on SGLT 2 inh due to recurrent urinary tract infections.  Continue metoprolol.  Hold on ARB due to risk of hypotension.

## 2023-06-16 NOTE — Assessment & Plan Note (Signed)
Continue statin therapy and anticoagulation with apixaban.

## 2023-06-16 NOTE — Assessment & Plan Note (Signed)
Continue metoprolol, possible addition of ARB as outpatient.

## 2023-06-16 NOTE — Assessment & Plan Note (Signed)
Continue statin therapy.

## 2023-06-16 NOTE — Assessment & Plan Note (Signed)
Atrial flutter.,   Plan to continue rate control with diltiazem and metoprolol. Continue anticoagulation with apixaban.

## 2023-06-17 ENCOUNTER — Other Ambulatory Visit (HOSPITAL_COMMUNITY): Payer: Self-pay

## 2023-06-17 DIAGNOSIS — I5042 Chronic combined systolic (congestive) and diastolic (congestive) heart failure: Secondary | ICD-10-CM | POA: Diagnosis not present

## 2023-06-17 DIAGNOSIS — N1832 Chronic kidney disease, stage 3b: Secondary | ICD-10-CM | POA: Diagnosis not present

## 2023-06-17 DIAGNOSIS — I5033 Acute on chronic diastolic (congestive) heart failure: Secondary | ICD-10-CM | POA: Diagnosis not present

## 2023-06-17 MED ORDER — CHLORHEXIDINE GLUCONATE CLOTH 2 % EX PADS
6.0000 | MEDICATED_PAD | Freq: Every day | CUTANEOUS | Status: DC
  Administered 2023-06-17 – 2023-06-18 (×2): 6 via TOPICAL

## 2023-06-17 MED ORDER — FUROSEMIDE 40 MG PO TABS
40.0000 mg | ORAL_TABLET | Freq: Every day | ORAL | Status: DC
  Administered 2023-06-17 – 2023-06-18 (×2): 40 mg via ORAL
  Filled 2023-06-17 (×2): qty 1

## 2023-06-17 MED ORDER — FUROSEMIDE 40 MG PO TABS
40.0000 mg | ORAL_TABLET | Freq: Two times a day (BID) | ORAL | Status: DC
Start: 2023-06-17 — End: 2023-06-17

## 2023-06-17 MED ORDER — DILTIAZEM HCL 30 MG PO TABS
30.0000 mg | ORAL_TABLET | Freq: Three times a day (TID) | ORAL | Status: DC
  Administered 2023-06-17 – 2023-06-18 (×2): 30 mg via ORAL
  Filled 2023-06-17 (×2): qty 1

## 2023-06-17 NOTE — Plan of Care (Signed)

## 2023-06-17 NOTE — Progress Notes (Signed)
Mobility Specialist Progress Note:   06/17/23 0943  Mobility  Activity Ambulated with assistance in hallway  Level of Assistance Contact guard assist, steadying assist  Assistive Device Front wheel walker  Distance Ambulated (ft) 120 ft  Activity Response Tolerated well  Mobility Referral Yes  $Mobility charge 1 Mobility  Mobility Specialist Start Time (ACUTE ONLY) [id number]  Mobility Specialist Stop Time (ACUTE ONLY) 0955  Mobility Specialist Time Calculation (min) (ACUTE ONLY) 12 min   Pt received ambulating back from BR with student RN. On RA, pt agreeable to mobility session. Ambulated with only minG assist throughout. C/o minor SOB, SpO2 WFL once returned to bed. Pt left with all needs met.  Addison Lank Mobility Specialist Please contact via SecureChat or  Rehab office at [phone]

## 2023-06-17 NOTE — Progress Notes (Signed)
    Durable Medical Equipment  (From admission, onward)           Start     Ordered   06/16/23 1522  For home use only DME oxygen  Once       Question Answer Comment  Length of Need 12 Months   Mode or (Route) Nasal cannula   Liters per Minute 2   Frequency Continuous (stationary and portable oxygen unit needed)   Oxygen conserving device Yes   Oxygen delivery system Gas      06/16/23 1521           SATURATION QUALIFICATIONS: (This note is used to comply with regulatory documentation for home oxygen)   Patient Saturations on Room Air at Rest = 94%   Patient Saturations on Room Air while Ambulating = 87%   Patient Saturations on 2 Liters of oxygen while Ambulating = 94%   Please briefly explain why patient needs home oxygen: to maintain SPO2 >88%

## 2023-06-17 NOTE — TOC Progression Note (Signed)
Transition of Care [facility]) - Progression Note    Patient Details  Name: Adrienne [name] MRN: [id number] Date of Birth: 1927/10/31  Transition of Care [facility]) CM/SW Contact  Lawerance Sabal, RN Phone Number: 06/17/2023, 9:43 AM  Clinical Narrative:     Notified [employer] (current O2 provider) that she has new orders for continuous O2, they will bring portable oxygen for transportation home to room today.   Expected Discharge Plan: Home w Home Health Services Barriers to Discharge: Continued Medical Work up  Expected Discharge Plan and Services In-house Referral: NA Discharge Planning Services: CM Consult Post Acute Care Choice: Home Health Living arrangements for the past 2 months: Single Family Home Expected Discharge Date: 06/16/23               DME Arranged: Oxygen DME Agency: [employer] Date DME Agency Contacted: 06/17/23 Time DME Agency Contacted: [phone] Representative spoke with at DME Agency: Tawanna Cooler Arranged: PT HH Agency: [facility] Date [facility] Agency Contacted: 06/16/23 Time HH Agency Contacted: [phone] Representative spoke with at [facility] Agency: Kandee Keen   Social Determinants of Health (SDOH) Interventions SDOH Screenings   Food Insecurity: No Food Insecurity (06/15/2023)  Housing: Patient Declined (06/15/2023)  Transportation Needs: No Transportation Needs (06/15/2023)  Utilities: Not At Risk (06/15/2023)  Tobacco Use: Medium Risk (06/15/2023)    Readmission Risk Interventions    06/16/2023    1:21 PM 06/07/2023   10:15 AM 05/05/2023    5:19 PM  Readmission Risk Prevention Plan  Transportation Screening Complete Complete Complete  Medication Review Oceanographer) Complete Complete Complete  PCP or Specialist appointment within 3-5 days of discharge  Complete   HRI or Home Care Consult Complete Complete Complete  SW Recovery Care/Counseling Consult  Complete   Palliative Care Screening Not Applicable Not Applicable Not Applicable  Skilled Nursing Facility Not  Applicable Not Applicable Not Applicable

## 2023-06-17 NOTE — Progress Notes (Signed)
Patient is feeling better today, dyspnea has improved and she has been ambulatory in the hallway with assistance.   BP (!) 142/66 (BP Location: Left Arm)   Pulse 85   Temp 98.4 F (36.9 C) (Oral)   Resp 18   Ht 4\' 11"  (1.499 m)   Wt 57.5 kg   LMP 09/12/1973 (Approximate)   SpO2 93%   BMI 25.60 kg/m   Neurology awake and alert ENT with mild pallor Cardiovascular with S1 and S2 present, irregularly irregular with no gallops, positive systolic murmur at the right lower sternal border No JVD No lower extremity edema Respiratory with no rales or wheezing, no rhonchi Abdomen with no distention  Acute on chronic diastolic heart failure.  Clinically stable for discharge.   I spoke with patient's husband at the bedside, we talked in detail about patient's condition, plan of care and prognosis and all questions were addressed.

## 2023-06-17 NOTE — TOC Transition Note (Signed)
Transition of Care [facility]) - CM/SW Discharge Note   Patient Details  Name: Adrienne [name] MRN: [id number] Date of Birth: 1928-06-03  Transition of Care [facility]) CM/SW Contact:  Lawerance Sabal, RN Phone Number: 06/17/2023, 10:57 AM   Clinical Narrative:     Notified [employer] of oxygen order, and [place] of [place].  No other needs identified.     Barriers to Discharge: Continued Medical Work up   Patient Goals and CMS Choice CMS Medicare.gov Compare Post Acute Care list provided to:: Patient Choice offered to / list presented to : Patient  Discharge Placement                         Discharge Plan and Services Additional resources added to the After Visit Summary for   In-house Referral: NA Discharge Planning Services: CM Consult Post Acute Care Choice: Home Health          DME Arranged: Oxygen DME Agency: [employer] Date DME Agency Contacted: 06/17/23 Time DME Agency Contacted: [phone] Representative spoke with at DME Agency: Tawanna Cooler Arranged: PT HH Agency: [facility] Date [facility] Agency Contacted: 06/17/23 Time HH Agency Contacted: 1057 Representative spoke with at [facility] Agency: Kandee Keen  Social Determinants of Health (SDOH) Interventions SDOH Screenings   Food Insecurity: No Food Insecurity (06/15/2023)  Housing: Patient Declined (06/15/2023)  Transportation Needs: No Transportation Needs (06/15/2023)  Utilities: Not At Risk (06/15/2023)  Tobacco Use: Medium Risk (06/15/2023)     Readmission Risk Interventions    06/16/2023    1:21 PM 06/07/2023   10:15 AM 05/05/2023    5:19 PM  Readmission Risk Prevention Plan  Transportation Screening Complete Complete Complete  Medication Review Oceanographer) Complete Complete Complete  PCP or Specialist appointment within 3-5 days of discharge  Complete   HRI or Home Care Consult Complete Complete Complete  SW Recovery Care/Counseling Consult  Complete   Palliative Care Screening Not Applicable Not Applicable  Not Applicable  Skilled Nursing Facility Not Applicable Not Applicable Not Applicable

## 2023-06-18 DIAGNOSIS — I5042 Chronic combined systolic (congestive) and diastolic (congestive) heart failure: Secondary | ICD-10-CM | POA: Diagnosis not present

## 2023-06-18 DIAGNOSIS — I5033 Acute on chronic diastolic (congestive) heart failure: Secondary | ICD-10-CM | POA: Diagnosis not present

## 2023-06-18 MED ORDER — DILTIAZEM HCL 60 MG PO TABS: 30.0000 mg | ORAL_TABLET | Freq: Three times a day (TID) | ORAL | Status: DC

## 2023-06-18 NOTE — Progress Notes (Signed)
Patient is feeling better today, no further dizziness or light headedness  Diltiazem reduced to 30 mg   BP 128/85 (BP Location: Left Arm)   Pulse 65   Temp 97.7 F (36.5 C) (Oral)   Resp 20   Ht 4\' 11"  (1.499 m)   Wt 57.5 kg   LMP 09/12/1973 (Approximate)   SpO2 98%   BMI 25.60 kg/m   Neurology awake and alert ENT with mild pallor Cardiovascular with S1 and S2 present and irregular with no gallops, rubs or murmurs Respiratory with no rales or wheezing, Abdomen with no distention  No lower extremity edema.   Telemetry with atrial flutter rhythm with variable block, reate 60 to 70 bpm.   Heart failure decompensation, Plan for discharge home today, reduced dose of diltiazem Follow up with home health services.

## 2023-06-18 NOTE — Progress Notes (Signed)
Mobility Specialist Progress Note:   06/18/23 1040  Mobility  Activity Ambulated with assistance in hallway  Level of Assistance Contact guard assist, steadying assist  Assistive Device Front wheel walker  Distance Ambulated (ft) 200 ft  Activity Response Tolerated well  Mobility Referral Yes  $Mobility charge 1 Mobility  Mobility Specialist Start Time (ACUTE ONLY) 1040  Mobility Specialist Stop Time (ACUTE ONLY) 1103  Mobility Specialist Time Calculation (min) (ACUTE ONLY) 23 min   Pt agreeable to mobility session. Required only minG assist for safety. SpO2 91-100% on RA throughout. Pt back in bed with all needs met.   Addison Lank Mobility Specialist Please contact via SecureChat or  Rehab office at [phone]

## 2023-06-19 ENCOUNTER — Other Ambulatory Visit (HOSPITAL_COMMUNITY): Payer: Self-pay

## 2023-06-21 ENCOUNTER — Other Ambulatory Visit (HOSPITAL_COMMUNITY): Payer: Self-pay

## 2023-06-21 DIAGNOSIS — I5033 Acute on chronic diastolic (congestive) heart failure: Secondary | ICD-10-CM | POA: Diagnosis not present

## 2023-06-21 DIAGNOSIS — N1832 Chronic kidney disease, stage 3b: Secondary | ICD-10-CM | POA: Diagnosis not present

## 2023-06-21 DIAGNOSIS — Z6826 Body mass index (BMI) 26.0-26.9, adult: Secondary | ICD-10-CM | POA: Diagnosis not present

## 2023-06-21 DIAGNOSIS — I2510 Atherosclerotic heart disease of native coronary artery without angina pectoris: Secondary | ICD-10-CM | POA: Diagnosis not present

## 2023-06-21 DIAGNOSIS — E7800 Pure hypercholesterolemia, unspecified: Secondary | ICD-10-CM | POA: Diagnosis not present

## 2023-06-21 DIAGNOSIS — I4892 Unspecified atrial flutter: Secondary | ICD-10-CM | POA: Diagnosis not present

## 2023-06-21 DIAGNOSIS — I272 Pulmonary hypertension, unspecified: Secondary | ICD-10-CM | POA: Diagnosis not present

## 2023-06-21 DIAGNOSIS — D509 Iron deficiency anemia, unspecified: Secondary | ICD-10-CM | POA: Diagnosis not present

## 2023-06-21 DIAGNOSIS — Z09 Encounter for follow-up examination after completed treatment for conditions other than malignant neoplasm: Secondary | ICD-10-CM | POA: Diagnosis not present

## 2023-06-28 ENCOUNTER — Telehealth: Payer: Self-pay | Admitting: Cardiology

## 2023-06-28 NOTE — Telephone Encounter (Signed)
Pt c/o swelling: STAT is pt has developed SOB within 24 hours  If swelling, where is the swelling located? Ankles   How much weight have you gained and in what time span? 1 pound per day a few days   Have you gained 3 pounds in a day or 5 pounds in a week? No  Do you have a log of your daily weights (if so, list)? No  Are you currently taking a fluid pill? Yes  Are you currently SOB? Yes   Have you traveled recently? No

## 2023-06-28 NOTE — Telephone Encounter (Signed)
Called spoke with Marcelino Duster, RN with [facility].  She reports wants to make Dr. Anne Fu aware that pt c/o a small amount of ankle swelling, SOB and a 2 lb wt gain since release from hospital for CHF on 06/18/23.  Will call pt to f/u symptoms.  Pt reports just a little more swelling to ankles than previously noted at DC from hospital.  Has a little SOB with exercise and increased activity.  Pt reports this is normal for her.   Reports elevates legs throughout the day, wears mild compressions socks and has not had increased sodium intake.   Advised pt if gains 2-3 lbs in 24 hours or increased SOB to call our office. Pt expresses understanding.  Would like me to send message to Dr. Anne Fu to make sure he doesn't want to make medication changes.  Advised at this time I don't think changes are needed.  Pt has OV with MD on 07/05/23.  Pt to call in if has further needs prior to OV.

## 2023-06-29 ENCOUNTER — Other Ambulatory Visit: Payer: Self-pay | Admitting: Hematology and Oncology

## 2023-06-29 ENCOUNTER — Inpatient Hospital Stay: Payer: Medicare Other

## 2023-06-29 ENCOUNTER — Inpatient Hospital Stay: Payer: Medicare Other | Attending: Hematology and Oncology | Admitting: Hematology and Oncology

## 2023-06-29 VITALS — BP 133/68 | HR 74 | Temp 97.7°F | Resp 18 | Wt 129.5 lb

## 2023-06-29 DIAGNOSIS — Z79899 Other long term (current) drug therapy: Secondary | ICD-10-CM | POA: Diagnosis not present

## 2023-06-29 DIAGNOSIS — C50811 Malignant neoplasm of overlapping sites of right female breast: Secondary | ICD-10-CM

## 2023-06-29 DIAGNOSIS — Z170 Estrogen receptor positive status [ER+]: Secondary | ICD-10-CM

## 2023-06-29 DIAGNOSIS — Z9221 Personal history of antineoplastic chemotherapy: Secondary | ICD-10-CM | POA: Insufficient documentation

## 2023-06-29 DIAGNOSIS — C50212 Malignant neoplasm of upper-inner quadrant of left female breast: Secondary | ICD-10-CM | POA: Diagnosis not present

## 2023-06-29 DIAGNOSIS — Z853 Personal history of malignant neoplasm of breast: Secondary | ICD-10-CM | POA: Insufficient documentation

## 2023-06-29 DIAGNOSIS — Z9011 Acquired absence of right breast and nipple: Secondary | ICD-10-CM | POA: Diagnosis not present

## 2023-06-29 DIAGNOSIS — Z87891 Personal history of nicotine dependence: Secondary | ICD-10-CM | POA: Diagnosis not present

## 2023-06-29 DIAGNOSIS — Z923 Personal history of irradiation: Secondary | ICD-10-CM | POA: Diagnosis not present

## 2023-06-29 LAB — CMP (CANCER CENTER ONLY)
ALT: 11 U/L (ref 0–44)
AST: 16 U/L (ref 15–41)
Albumin: 3.8 g/dL (ref 3.5–5.0)
Alkaline Phosphatase: 64 U/L (ref 38–126)
Anion gap: 6 (ref 5–15)
BUN: 26 mg/dL — ABNORMAL HIGH (ref 8–23)
CO2: 30 mmol/L (ref 22–32)
Calcium: 9.5 mg/dL (ref 8.9–10.3)
Chloride: 99 mmol/L (ref 98–111)
Creatinine: 1.15 mg/dL — ABNORMAL HIGH (ref 0.44–1.00)
GFR, Estimated: 44 mL/min — ABNORMAL LOW (ref >60)
Glucose, Bld: 96 mg/dL (ref 70–99)
Potassium: 4.6 mmol/L (ref 3.5–5.1)
Sodium: 135 mmol/L (ref 135–145)
Total Bilirubin: 0.5 mg/dL (ref 0.3–1.2)
Total Protein: 7.1 g/dL (ref 6.5–8.1)

## 2023-06-29 LAB — CBC WITH DIFFERENTIAL (CANCER CENTER ONLY)
Abs Immature Granulocytes: 0.03 10*3/uL (ref 0.00–0.07)
Basophils Absolute: 0 10*3/uL (ref 0.0–0.1)
Basophils Relative: 0 %
Eosinophils Absolute: 0.2 10*3/uL (ref 0.0–0.5)
Eosinophils Relative: 2 %
HCT: 32.1 % — ABNORMAL LOW (ref 36.0–46.0)
Hemoglobin: 10.9 g/dL — ABNORMAL LOW (ref 12.0–15.0)
Immature Granulocytes: 0 %
Lymphocytes Relative: 17 %
Lymphs Abs: 1.4 10*3/uL (ref 0.7–4.0)
MCH: 32.2 pg (ref 26.0–34.0)
MCHC: 34 g/dL (ref 30.0–36.0)
MCV: 95 fL (ref 80.0–100.0)
Monocytes Absolute: 0.6 10*3/uL (ref 0.1–1.0)
Monocytes Relative: 7 %
Neutro Abs: 5.9 10*3/uL (ref 1.7–7.7)
Neutrophils Relative %: 74 %
Platelet Count: 200 10*3/uL (ref 150–400)
RBC: 3.38 MIL/uL — ABNORMAL LOW (ref 3.87–5.11)
RDW: 14.6 % (ref 11.5–15.5)
WBC Count: 8.2 10*3/uL (ref 4.0–10.5)
nRBC: 0 % (ref 0.0–0.2)

## 2023-06-29 NOTE — Progress Notes (Signed)
[facility]  [phone] [phone]     ID: Adrienne [name] DOB: Nov 27, 1927  MR#: [id number]  JYN#:[id number]  Patient Care Team: Sigmund Hazel, MD as PCP - General (Family Medicine) Jake Bathe, MD as PCP - Cardiology (Cardiology) Ardell Isaacs, Forrestine Him, MD as Consulting Physician (Obstetrics and Gynecology) Magrinat, Valentino Hue, MD (Inactive) as Consulting Physician (Oncology) Jake Bathe, MD as Consulting Physician (Cardiology) Bufford Buttner, MD as Referring Physician (Dermatology) Josephine Igo, DO as Consulting Physician (Pulmonary Disease) Emelia Loron, MD as Consulting Physician (General Surgery) Antony Blackbird, MD as Consulting Physician (Radiation Oncology) Rachel Moulds, MD   CHIEF COMPLAINT: Bilateral breast cancer,  estrogen receptor positive (s/p right mastectomy)  CURRENT TREATMENT: Observation  INTERVAL HISTORY:  Adrienne [name] returns today for follow up of her bilateral breast cancers.  She continues under observation.  She is here by herself. Since her last visit here, she has been doing quite well.  She has had more additions to her family and she is ecstatic to have more great-grandchildren.  She denies any major complaints, no change in the chest area. Her mammogram was last done in March 2024. Rest of the pertinent 10 point ROS reviewed and negative.  COVID 19 VACCINATION STATUS: Status post Pfizer x2+ booster October 2021  RIGHT BREAST CANCER HISTORY: From the original intake note:  Adrienne [name] has a right breast mass noted by her primary care doctor in [place] in 2012.  She was referred to surgery and underwent right mastectomy and axillary lymph node dissection or sampling (we do not have the pathology report) 06/04/2011 for what appears to have been a multicentric invasive ductal carcinoma, grade 2.  There were three separate tumor foci, spanning a total of 3.8 cm. Prognostic  indicators were: ER positive, PR positive, and Her2 negative. One out of 3 biopsied lymph nodes was positive for micrometastasis.   LEFT BREAST CANCER HISTORY: Adrienne [name] had routine left screening mammography on 11/19/2019 showing a possible abnormality. She underwent left diagnostic mammography with tomography and left breast ultrasonography at [facility] on 12/05/2019 showing: breast density category C; 0.7 cm left breast mass at 11 o'clock; development of three left breast cysts compared to prior imaging from [place] in 11/2017; no left axillary adenopathy.  Accordingly on 12/10/2019 she proceeded to biopsy of the left breast area in question. The pathology from this procedure ([id number]) showed: invasive ductal carcinoma, grade 3; ductal carcinoma in situ. Prognostic indicators significant for: estrogen receptor, 0% negative and progesterone receptor, 0% negative. Proliferation marker Ki67 at 90%. HER2 negative by immunohistochemistry (0).  The patient's subsequent history is as detailed below.   PAST MEDICAL HISTORY: Past Medical History:  Diagnosis Date   Afib (HCC)    Arthritis of ankle    CAD (coronary artery disease)    Cancer (HCC) 2012   breast cancer-right   Compression fracture of T12 vertebra (HCC)    Diverticulosis    Edema    Elevated uric acid in blood    GERD (gastroesophageal reflux disease)    GI bleed 12/2015   Hyperlipidemia    Hypertension    Long-term (current) use of anticoagulants    Moderate mitral regurgitation    Personal history of radiation therapy    Pulmonary HTN (HCC)    Echo 1/19: EF 60-65, no RWMA, Ao sclerosis, trivial AI, mild MR, mod BAE, mod TR, PASP 49   Stroke (HCC) 01/25/2016   TIA (transient ischemic attack) 09/2011  Urinary incontinence    Urinary retention     PAST SURGICAL HISTORY: Past Surgical History:  Procedure Laterality Date   APPENDECTOMY     AXILLARY SENTINEL NODE BIOPSY Left 01/22/2020   Procedure: Left Axillary  Sentinel Node Biopsy;  Surgeon: Emelia Loron, MD;  Location: [facility] OR;  Service: General;  Laterality: Left;   BREAST LUMPECTOMY WITH RADIOACTIVE SEED AND SENTINEL LYMPH NODE BIOPSY Left 01/22/2020   Procedure: LEFT BREAST LUMPECTOMY WITH RADIOACTIVE SEED;  Surgeon: Emelia Loron, MD;  Location: [facility] OR;  Service: General;  Laterality: Left;   BREAST SURGERY  2012   Rt.mastectomy--[place], [place]   CARDIAC CATHETERIZATION  2013   CATARACT EXTRACTION, BILATERAL     CERVICAL CONIZATION W/BX N/A 08/08/2020   Procedure: VAGINAL SIDEWALL BIOPSY;  Surgeon: Jerene Bears, MD;  Location[facility] OR;  Service: Gynecology;  Laterality: N/A;   DILATION AND CURETTAGE OF UTERUS     in her early 34's for DUB   ESOPHAGOGASTRODUODENOSCOPY Left 01/08/2016   Procedure: ESOPHAGOGASTRODUODENOSCOPY (EGD);  Surgeon: Willis Modena, MD;  Location: [facility];  Service: Endoscopy;  Laterality: Left;   GALLBLADDER SURGERY     MASTECTOMY Right 2012   RADIOLOGY WITH ANESTHESIA N/A 01/25/2016   Procedure: RADIOLOGY WITH ANESTHESIA;  Surgeon: Julieanne Cotton, MD;  Location: [facility] OR;  Service: Radiology;  Laterality: N/A;   REPLACEMENT TOTAL KNEE BILATERAL     TONSILLECTOMY AND ADENOIDECTOMY     as a child    FAMILY HISTORY: Family History  Problem Relation Age of Onset   Asthma Mother    CVA Father    Atrial fibrillation Sister        HAS PACER   Pneumonia Brother    Cancer Daughter        COLON CANCER   Cancer Maternal Grandmother        breast cancer  Patient's father was 70 years old when he died from stroke. Patient's mother died from coronary artery disease at age 71. The patient denies a family hx of ovarian cancer. She reports breast cancer in her maternal grandmother. She has 2 siblings, 1 brother and 1 sister. Her brother died from stroke/AIDS. She also reports pancreatic cancer in her paternal grandmother and colon cancer in her daughter.   GYNECOLOGIC HISTORY:  Patient's last menstrual period was  09/12/1973 (approximate). Menarche: 89 years old Age at first live birth: 89 years old GX P 3 LMP late 33s Contraceptive never HRT yes, remote , less than 6 months Hysterectomy?  No BSO?  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  Her husband Chrissie Noa is a Public affairs consultant, (PhD, worked at [employer]).  Their children are Daria Pastures who lives in [place] and whose husband is a Charity fundraiser; Lona Kettle [employer]") who lives in [place] and works in finances; and Nora, who lives in [place] and works for [employer].  The patient has 11 grandchildren and 8 great-grandchildren.  She attends [employer] locally    ADVANCED DIRECTIVES: In the absence of documents to the contrary the patient's husband is her healthcare power of attorney   HEALTH MAINTENANCE: Social History   Tobacco Use   Smoking status: Former    Current packs/day: 0.00    Average packs/day: 0.3 packs/day for 2.0 years (0.5 ttl pk-yrs)    Types: Cigarettes    Start date: 44    Quit date: 1950  Years since quitting: 74.8   Smokeless tobacco: Never  Vaping Use   Vaping status: Never Used  Substance Use Topics   Alcohol use: No    Alcohol/week: 0.0 standard drinks of alcohol   Drug use: No     Colonoscopy: Remote  PAP: 02/2019, negative  Bone density: Osteopenia   Allergies  Allergen Reactions   Valium [Diazepam] Other (See Comments)    HYPERACTIVITY    Amiodarone Hcl [Amiodarone] Other (See Comments)    A-Fib   Compazine [Prochlorperazine] Other (See Comments)    HYPERACTIVITY    Other Other (See Comments)    "Kidney dye"--patient states this gave her severe shakes/chills   Thorazine [Chlorpromazine] Other (See Comments)    HYPERACTIVITY    Zometa [Zoledronic Acid] Other (See Comments)    PROBLEMS WITH EYES  TIA   Ciprofloxacin Swelling and Other (See Comments)    "swelling" in Rt.elbow    Doxycycline Nausea Only   Zoster Vac Recomb Adjuvanted Other (See Comments)    Welt under injection site   Iodinated Contrast Media Anxiety and Other (See Comments)    Chills and anxiety  Other Reaction(s): chills/shaking, Kidney dye    Current Outpatient Medications  Medication Sig Dispense Refill   acetaminophen (TYLENOL) 500 MG tablet Take 1,000 mg by mouth every 12 (twelve) hours as needed for mild pain.     albuterol (VENTOLIN HFA) 108 (90 Base) MCG/ACT inhaler Inhale 1 puff into the lungs every 6 (six) hours as needed for wheezing or shortness of breath (Cough). 18 g 2   allopurinol (ZYLOPRIM) 100 MG tablet Take 1 tablet (100 mg total) by mouth daily. 30 tablet 1   apixaban (ELIQUIS) 2.5 MG TABS tablet Take 1 tablet (2.5 mg total) by mouth 2 (two) times daily. 180 tablet 3   ascorbic acid (VITAMIN C) 500 MG tablet Take 500 mg by mouth 2 (two) times daily.     atorvastatin (LIPITOR) 10 MG tablet TAKE 1 TABLET DAILY 90 tablet 3   B Complex Vitamins (VITAMIN B COMPLEX) CAPS Take 1 capsule by mouth daily.     calcium carbonate (OSCAL) 1500 (600 Ca) MG TABS tablet Take 1,500 mg by mouth 2 (two) times daily with a meal.     cyanocobalamin (VITAMIN B12) 1000 MCG tablet Take 1,000 mcg by mouth daily.     diltiazem (CARDIZEM) 60 MG tablet Take 0.5 tablets (30 mg total) by mouth 3 (three) times daily.     docusate sodium (COLACE) 100 MG capsule Take 100 mg by mouth daily as needed.     Ferrous Sulfate (IRON) 325 (65 Fe) MG TABS Take 325 mg by mouth daily. (OTC)     furosemide (LASIX) 20 MG tablet Take 2 tablets (40 mg total) by mouth daily. 60 tablet 0   metoprolol tartrate (LOPRESSOR) 100 MG tablet TAKE 1 TABLET TWICE A DAY 180 tablet 3   Multiple Vitamins-Minerals (MULTIVITAMIN WITH MINERALS) tablet Take 1 tablet by mouth daily.     omeprazole (PRILOSEC) 10 MG capsule Take 10 mg by mouth every other day.     polyethylene glycol (MIRALAX / GLYCOLAX) 17 g packet Take 17 g by mouth daily.      potassium chloride SA (KLOR-CON M) 20 MEQ tablet Take 1 tablet (20 mEq total) by mouth daily. 30 tablet 0   Probiotic Product (UP4 PROBIOTICS WOMENS PO) Take 1 capsule by mouth daily with breakfast.     No current facility-administered medications for this visit.    OBJECTIVE: White  woman who appears stated age  Vitals:   06/29/23 1502  BP: 133/68  Pulse: 74  Resp: 18  Temp: 97.7 F (36.5 C)  SpO2: 99%     Body mass index is 26.16 kg/m.   Wt Readings from Last 3 Encounters:  06/29/23 129 lb 8 oz (58.7 kg)  06/16/23 126 lb 12.2 oz (57.5 kg)  05/30/23 125 lb (56.7 kg)     ECOG FS: 1 Physical Exam Constitutional:      General: She is not in acute distress.    Appearance: Normal appearance.  Chest:     Comments: Right breast status postmastectomy.  Left breast normal to inspection except for inverted nipple.  There appears to be postoperative changes in the left breast upper outer quadrant with postop seroma.  No other masses or regional adenopathy. Exam limited since patient is sitting in the chair leaning backwards. No significant change since her last visit here Musculoskeletal:     Cervical back: Normal range of motion and neck supple. No rigidity.  Skin:    General: Skin is warm and dry.  Neurological:     General: No focal deficit present.     Mental Status: She is alert.       LAB RESULTS:  CMP     Component Value Date/Time   NA 137 06/16/2023 0327   NA 138 04/21/2023 1059   K 3.7 06/16/2023 0327   CL 96 (L) 06/16/2023 0327   CO2 31 06/16/2023 0327   GLUCOSE 106 (H) 06/16/2023 0327   BUN 19 06/16/2023 0327   BUN 18 04/21/2023 1059   CREATININE 1.08 (H) 06/16/2023 0327   CREATININE 1.14 (H) 06/28/2022 1423   CALCIUM 8.8 (L) 06/16/2023 0327   PROT 6.1 (L) 06/04/2023 0716   PROT 6.6 05/06/2019 1608   ALBUMIN 2.9 (L) 06/04/2023 0716   ALBUMIN 3.9 05/06/2019 1608   AST 14 (L) 06/04/2023 0716   AST 15 06/28/2022 1423   ALT 10 06/04/2023 0716   ALT 10  06/28/2022 1423   ALKPHOS 56 06/04/2023 0716   BILITOT 0.6 06/04/2023 0716   BILITOT 0.5 06/28/2022 1423   GFRNONAA 48 (L) 06/16/2023 0327   GFRNONAA 45 (L) 06/28/2022 1423   GFRAA 43 (L) 11/04/2020 1647    No results found for: "TOTALPROTELP", "ALBUMINELP", "A1GS", "A2GS", "BETS", "BETA2SER", "GAMS", "MSPIKE", "SPEI"  No results found for: "KPAFRELGTCHN", "LAMBDASER", "KAPLAMBRATIO"  Lab Results  Component Value Date   WBC 8.2 06/29/2023   NEUTROABS 5.9 06/29/2023   HGB 10.9 (L) 06/29/2023   HCT 32.1 (L) 06/29/2023   MCV 95.0 06/29/2023   PLT 200 06/29/2023    No results found for: "LABCA2"  No components found for: "EPPIRJ188"  No results for input(s): "INR" in the last 168 hours.  No results found for: "LABCA2"  No results found for: "CZY606"  No results found for: "CAN125"  No results found for: "CAN153"  No results found for: "CA2729"  No components found for: "HGQUANT"  No results found for: "CEA1", "CEA" / No results found for: "CEA1", "CEA"   No results found for: "AFPTUMOR"  No results found for: "CHROMOGRNA"  No results found for: "HGBA", "HGBA2QUANT", "HGBFQUANT", "HGBSQUAN" (Hemoglobinopathy evaluation)   No results found for: "LDH"  Lab Results  Component Value Date   IRON 66 08/25/2020   (Iron and TIBC)  Lab Results  Component Value Date   FERRITIN 101 08/25/2020    Urinalysis    Component Value Date/Time   COLORURINE YELLOW 06/15/2023  1545   APPEARANCEUR CLEAR 06/15/2023 1545   LABSPEC <1.005 (L) 06/15/2023 1545   PHURINE 7.0 06/15/2023 1545   GLUCOSEU NEGATIVE 06/15/2023 1545   HGBUR LARGE (A) 06/15/2023 1545   BILIRUBINUR NEGATIVE 06/15/2023 1545   BILIRUBINUR n 04/14/2020 1705   KETONESUR NEGATIVE 06/15/2023 1545   PROTEINUR NEGATIVE 06/15/2023 1545   UROBILINOGEN 0.2 04/14/2020 1705   NITRITE NEGATIVE 06/15/2023 1545   LEUKOCYTESUR NEGATIVE 06/15/2023 1545    STUDIES: DG Chest 2 View  Result Date:  06/15/2023 CLINICAL DATA:  Shortness of breath.  Cough.  CHF. EXAM: CHEST - 2 VIEW COMPARISON:  06/05/2023 FINDINGS: Lateral view degraded by patient arm position. Patient rotated right on the frontal. Mild cardiomegaly. Atherosclerosis in the transverse aorta. Trace right pleural fluid or thickening blunts the costophrenic angle on the frontal. No left-sided pleural fluid or pneumothorax. Pulmonary venous congestion with improved interstitial edema. Increased right and persistent left base airspace disease. Surgical clips overlie the left breast. IMPRESSION: Cardiomegaly with pulmonary venous congestion. Trace right pleural fluid or thickening. Similar left and mild increase in right base airspace disease, favoring atelectasis. At the right lung base, early infection or aspiration cannot be excluded. Aortic Atherosclerosis (ICD10-I70.0). Electronically Signed   By: Jeronimo Greaves M.D.   On: 06/15/2023 16:45   DG CHEST PORT 1 VIEW  Result Date: 06/05/2023 CLINICAL DATA:  Congestive heart failure. EXAM: PORTABLE CHEST 1 VIEW COMPARISON:  05/04/2023 FINDINGS: Mild cardiac enlargement. Aortic atherosclerotic calcifications. Mild diffuse increase interstitial markings and small pleural effusions compatible with mild CHF. No focal airspace consolidation. Surgical clips noted in the left axilla and left breast. No focal bone abnormality. IMPRESSION: Mild CHF. Aortic Atherosclerosis (ICD10-I70.0). Electronically Signed   By: Signa Kell M.D.   On: 06/05/2023 12:41   CT Renal Stone Study  Result Date: 06/03/2023 CLINICAL DATA:  Recent diagnosis of UTI with continued complications. EXAM: CT ABDOMEN AND PELVIS WITHOUT CONTRAST TECHNIQUE: Multidetector CT imaging of the abdomen and pelvis was performed following the standard protocol without IV contrast. RADIATION DOSE REDUCTION: This exam was performed according to the departmental dose-optimization program which includes automated exposure control, adjustment of the  mA and/or kV according to patient size and/or use of iterative reconstruction technique. COMPARISON:  March 23, 2023 and December 25, 2015 FINDINGS: Lower chest: There are small, stable bilateral pleural effusions. Hepatobiliary: No focal liver abnormality is seen. Status post cholecystectomy. No biliary dilatation. Pancreas: Unremarkable. No pancreatic ductal dilatation or surrounding inflammatory changes. Spleen: Normal in size without focal abnormality. Adrenals/Urinary Tract: Adrenal glands are unremarkable. Kidneys are normal in size, without focal lesions. A 2 mm nonobstructing renal calculus is seen within the mid left kidney. There is stable marked severity left-sided hydronephrosis and hydroureter which extends into an area of posterior pelvic floor prolapse. The urinary bladder is poorly distended and subsequently limited in evaluation. A Foley catheter is in place. Stomach/Bowel: Stomach is within normal limits. The appendix is surgically absent. No evidence of bowel wall thickening, distention, or inflammatory changes. Numerous noninflamed diverticula are seen throughout the large bowel. Persistent chronic twisting of the mesentery is seen involving the mid to lower abdomen, to the left of midline (axial CT images 26 through 40, CT series 2). No associated abnormal bowel loops are identified. Vascular/Lymphatic: Aortic atherosclerosis. No enlarged abdominal or pelvic lymph nodes. Reproductive: Status post hysterectomy. No adnexal masses. Other: No abdominal wall hernia. There is stable, marked severity laxity of the pelvic floor with subsequent posterior compartment descent and prolapse of  the rectum and anorectal junction. No abdominopelvic ascites. Musculoskeletal: Marked severity multilevel degenerative changes are seen. IMPRESSION: 1. Stable marked severity pelvic floor laxity with anterior and posterior compartment prolapse and subsequent partial obstruction of the left kidney secondary to mass effect  on the distal left ureter. 2. 2 mm nonobstructing left renal calculus. 3. Colonic diverticulosis. 4. Small, stable bilateral pleural effusions. 5. Evidence of prior cholecystectomy, appendectomy and hysterectomy. 6. Aortic atherosclerosis. Aortic Atherosclerosis (ICD10-I70.0). Electronically Signed   By: Aram Candela M.D.   On: 06/03/2023 19:50      ELIGIBLE FOR AVAILABLE RESEARCH PROTOCOL: No  ASSESSMENT: 89 y.o. [place] woman   RIGHT BREAST CANCER (1) status post right mastectomy and sentinel lymph node sampling September 2012 for what appears to have been a multifocal T1-T2 N1 (mic) invasive ductal carcinoma, estrogen and progesterone receptor positive, HER-2 not amplified  (2) completed 5 years of anastrozole 2017  LEFT BREAST CANCER (3) status post left breast upper inner quadrant biopsy 12/10/2019 for a clinical T1b N0, stage IB invasive ductal carcinoma grade 3, triple negative, with an MIB-1 of 90%  (4) status post left lumpectomy and sentinel lymph node sampling 01/22/2020 for a pT1a pN0, stage IA invasive ductal carcinoma, with negative margins  (a) ductal carcinoma in situ with necrosis also removed, all margins negative  (b) a total of 2 lymph nodes were removed  (5) adjuvant chemotherapy discussed, opted against  (6) meets criteria for genetics testing  (7) adjuvant radiation 03/02/2020 through 03/24/2020 Site Technique Total Dose (Gy) Dose per Fx (Gy) Completed Fx Beam Energies  Breast, Left: Breast_Lt 3D 42.72/42.72 2.67 16/16 6X, 15X     PLAN:  Patient is here for follow-up.  She is currently on observation.  She is a robust 89 year old, has been doing well so far without any concern for recurrence.  Her last mammogram in March 2024was without any concerns.  No change in physical exam.  Next mammogram due in March 2025, this has been ordered She will continue annual follow-up at this time.  Total time spent: 20 min including history, physical exam, review of  records, counseling and coordination of care  *Total Encounter Time as defined by the Centers for Medicare and Medicaid Services includes, in addition to the face-to-face time of a patient visit (documented in the note above) non-face-to-face time: obtaining and reviewing outside history, ordering and reviewing medications, tests or procedures, care coordination (communications with other health care professionals or caregivers) and documentation in the medical record.

## 2023-07-05 ENCOUNTER — Ambulatory Visit: Payer: Medicare Other | Attending: Cardiology | Admitting: Cardiology

## 2023-07-05 ENCOUNTER — Encounter: Payer: Self-pay | Admitting: Cardiology

## 2023-07-05 VITALS — BP 114/78 | HR 64 | Ht 59.0 in | Wt 128.0 lb

## 2023-07-05 DIAGNOSIS — I4821 Permanent atrial fibrillation: Secondary | ICD-10-CM | POA: Insufficient documentation

## 2023-07-05 DIAGNOSIS — N1832 Chronic kidney disease, stage 3b: Secondary | ICD-10-CM | POA: Diagnosis not present

## 2023-07-05 DIAGNOSIS — I5032 Chronic diastolic (congestive) heart failure: Secondary | ICD-10-CM | POA: Insufficient documentation

## 2023-07-05 NOTE — Progress Notes (Signed)
Cardiology Office Note:  .   Date:  07/05/2023  ID:  Adrienne [name], DOB [birth year], MRN [id number] PCP: Sigmund Hazel, MD  [facility] Providers Cardiologist:  Donato Schultz, MD     History of Present Illness: .   Adrienne [name] is a 89 y.o. female Discussed the use of AI scribe  History of Present Illness   A 89 year old patient with a history of congestive heart failure (CHF) presents for a follow-up visit after recent hospitalizations due to CHF exacerbations characterized by ankle swelling, edema, and shortness of breath. The patient reports that during these hospitalizations, she received Lasix injections to manage fluid accumulation. She also mentions recurrent urinary tract infections (UTIs) during these hospital stays, which are being managed in conjunction with a urologist.  Since the last hospitalization, the patient has been on a daily regimen of 40mg  Lasix, taken as two 20mg  doses. She has been monitoring her weight closely, with instructions to report any gain of three pounds over two days or five pounds over a week to her primary care physician.  The patient also reports a recent change in her heart medication. During the last hospital stay, her heart rate dropped, leading to a reduction in her diltiazem dosage from 60mg  to 30mg . She has continued this reduced dosage since discharge. The patient has not noticed a significant drop in her diastolic blood pressure at home, but a recent reading by a visiting nurse showed a systolic pressure of 101, which is lower than her usual range of 130s to 140s.  The patient is also on metoprolol, taken twice daily at a dose of 100mg . She reports feeling more lethargic but denies any fainting or loss of consciousness.  The patient is on a low-sodium diet and has been advised to limit her fluid intake, although she is unsure of the exact recommended amount. She expresses a strong desire to avoid further hospitalizations.            Studies Reviewed: .         Risk Assessment/Calculations:            Physical Exam:   VS:  BP 114/78   Pulse 64   Ht 4\' 11"  (1.499 m)   Wt 128 lb (58.1 kg)   LMP 09/12/1973 (Approximate)   SpO2 95%   BMI 25.85 kg/m    Wt Readings from Last 3 Encounters:  07/05/23 128 lb (58.1 kg)  06/29/23 129 lb 8 oz (58.7 kg)  06/16/23 126 lb 12.2 oz (57.5 kg)    GEN: Well nourished, well developed in no acute distress NECK: No JVD; No carotid bruits CARDIAC: IRRR, no murmurs, no rubs, no gallops RESPIRATORY:  Clear to auscultation without rales, wheezing or rhonchi  ABDOMEN: Soft, non-tender, non-distended EXTREMITIES:  No edema; No deformity   ASSESSMENT AND PLAN: .    Assessment and Plan    Diastolic chronic Congestive Heart Failure (CHF) Recent hospitalizations for CHF exacerbations with fluid overload. Currently on 40mg  Lasix daily. Discussed the importance of weight monitoring and the plan for additional Lasix dosing in the event of weight gain. -Continue Lasix 40mg  daily. -If weight increases by 3 pounds over 2 days or 5 pounds over a week, take an additional 40mg  of Lasix for 2-3 days. -Continue to monitor weight daily.  Hypertension Lower blood pressure readings noted at home and in the office. Currently on Metoprolol 100mg  twice daily and Diltiazem 30mg  daily. Discussed the possibility of eliminating Diltiazem due to lower  blood pressure readings. -Discontinue Diltiazem. -Continue Metoprolol 100mg  twice daily. -If pulse rate exceeds 100, take Diltiazem 30mg  as needed.  Fluid Intake Discussed fluid restriction in the context of CHF management. -Continue to limit fluid intake to 48 ounces daily.  Follow-up Given recent hospitalizations and medication changes, a follow-up visit is warranted in the near future. -Schedule follow-up appointment in 3 months.               Signed, Donato Schultz, MD

## 2023-07-05 NOTE — Patient Instructions (Signed)
Medication Instructions:  Your physician has recommended you make the following change in your medication:  1) STOP taking diltiazem *If you need a refill on your cardiac medications before your next appointment, please call your pharmacy*  Follow-Up: At [facility], you and your health needs are our priority.  As part of our continuing mission to provide you with exceptional heart care, we have created designated Provider Care Teams.  These Care Teams include your primary Cardiologist (physician) and Advanced Practice Providers (APPs -  Physician Assistants and Nurse Practitioners) who all work together to provide you with the care you need, when you need it.  Your next appointment:   3 months  Provider:   Tereso Newcomer, PA-C

## 2023-07-10 DIAGNOSIS — R338 Other retention of urine: Secondary | ICD-10-CM | POA: Diagnosis not present

## 2023-07-13 DIAGNOSIS — N3000 Acute cystitis without hematuria: Secondary | ICD-10-CM | POA: Diagnosis not present

## 2023-07-13 DIAGNOSIS — R3914 Feeling of incomplete bladder emptying: Secondary | ICD-10-CM | POA: Diagnosis not present

## 2023-07-13 DIAGNOSIS — N8111 Cystocele, midline: Secondary | ICD-10-CM | POA: Diagnosis not present

## 2023-08-01 DIAGNOSIS — H524 Presbyopia: Secondary | ICD-10-CM | POA: Diagnosis not present

## 2023-08-01 DIAGNOSIS — Z961 Presence of intraocular lens: Secondary | ICD-10-CM | POA: Diagnosis not present

## 2023-08-02 ENCOUNTER — Other Ambulatory Visit: Payer: Self-pay

## 2023-08-02 ENCOUNTER — Encounter (HOSPITAL_BASED_OUTPATIENT_CLINIC_OR_DEPARTMENT_OTHER): Payer: Self-pay

## 2023-08-02 ENCOUNTER — Emergency Department (HOSPITAL_BASED_OUTPATIENT_CLINIC_OR_DEPARTMENT_OTHER)
Admission: EM | Admit: 2023-08-02 | Discharge: 2023-08-02 | Disposition: A | Discharge status: Home / Self Care | Payer: Medicare Other | Attending: Emergency Medicine | Admitting: Emergency Medicine

## 2023-08-02 DIAGNOSIS — I509 Heart failure, unspecified: Secondary | ICD-10-CM | POA: Diagnosis not present

## 2023-08-02 DIAGNOSIS — N183 Chronic kidney disease, stage 3 unspecified: Secondary | ICD-10-CM | POA: Insufficient documentation

## 2023-08-02 DIAGNOSIS — I130 Hypertensive heart and chronic kidney disease with heart failure and stage 1 through stage 4 chronic kidney disease, or unspecified chronic kidney disease: Secondary | ICD-10-CM | POA: Insufficient documentation

## 2023-08-02 DIAGNOSIS — R338 Other retention of urine: Secondary | ICD-10-CM | POA: Diagnosis not present

## 2023-08-02 DIAGNOSIS — I4891 Unspecified atrial fibrillation: Secondary | ICD-10-CM | POA: Diagnosis not present

## 2023-08-02 DIAGNOSIS — Z7901 Long term (current) use of anticoagulants: Secondary | ICD-10-CM | POA: Insufficient documentation

## 2023-08-02 DIAGNOSIS — R339 Retention of urine, unspecified: Secondary | ICD-10-CM | POA: Diagnosis not present

## 2023-08-02 NOTE — Discharge Instructions (Signed)
Most likely her catheter had gotten clogged today.  It seems to be draining well now.  If you develop any further symptoms of the catheter not draining start having new abdominal pain, fever, vomiting return to the emergency room.  Otherwise plan on following up with your urologist on Monday as planned.

## 2023-08-02 NOTE — ED Triage Notes (Signed)
Patient states her urinary catheter has not been draining since 5 pm today, had some leaking around catheter, pain in bladder.

## 2023-08-02 NOTE — ED Provider Notes (Signed)
EMERGENCY DEPARTMENT AT [facility] Provider Note   CSN: [id number] Arrival date & time: 08/02/23  1844     History  Chief Complaint  Patient presents with   Urinary Retention    Adrienne [name] is a 89 y.o. female.  Pt is a 89y/o female with hx of severe bladder prolapse with chronic indwelling Foley, chronic atrial fibrillation on Eliquis, CHF, pulmonary hypertension, CAD, CKD 3, previous CVA who is presenting today due to more sudden onset of pelvic discomfort and her Foley is not draining since about 5 PM tonight.  She reports that she is having some leaking of urine around the catheter but really nothing has drained since 5.  She otherwise reported feeling okay prior to that.  She does have a long history of recurrent UTIs and having to be on antibiotics regularly.  She follows up with urology.  Reports that her catheter was due to be changed on Monday.  The history is provided by the patient.       Home Medications Prior to Admission medications   Medication Sig Start Date End Date Taking? Authorizing Provider  acetaminophen (TYLENOL) 500 MG tablet Take 1,000 mg by mouth every 12 (twelve) hours as needed for mild pain.    [provider]  albuterol (VENTOLIN HFA) 108 (90 Base) MCG/ACT inhaler Inhale 1 puff into the lungs every 6 (six) hours as needed for wheezing or shortness of breath (Cough). 09/08/22   Regalado, Belkys A, MD  allopurinol (ZYLOPRIM) 100 MG tablet Take 1 tablet (100 mg total) by mouth daily. 09/08/22   Regalado, Belkys A, MD  apixaban (ELIQUIS) 2.5 MG TABS tablet Take 1 tablet (2.5 mg total) by mouth 2 (two) times daily. 10/07/22   Jake Bathe, MD  ascorbic acid (VITAMIN C) 500 MG tablet Take 500 mg by mouth 2 (two) times daily.    [provider]  atorvastatin (LIPITOR) 10 MG tablet TAKE 1 TABLET DAILY 11/28/22   Jake Bathe, MD  B Complex Vitamins (VITAMIN B COMPLEX) CAPS Take 1 capsule by mouth daily.     [provider]  calcium carbonate (OSCAL) 1500 (600 Ca) MG TABS tablet Take 1,500 mg by mouth 2 (two) times daily with a meal.    [provider]  cyanocobalamin (VITAMIN B12) 1000 MCG tablet Take 1,000 mcg by mouth daily.    [provider]  docusate sodium (COLACE) 100 MG capsule Take 100 mg by mouth daily as needed. 06/10/23   [provider]  Ferrous Sulfate (IRON) 325 (65 Fe) MG TABS Take 325 mg by mouth daily. (OTC)    [provider]  furosemide (LASIX) 20 MG tablet Take 2 tablets (40 mg total) by mouth daily. 06/16/23   Arrien, York Ram, MD  metoprolol tartrate (LOPRESSOR) 100 MG tablet TAKE 1 TABLET TWICE A DAY 11/28/22   Jake Bathe, MD  Multiple Vitamins-Minerals (MULTIVITAMIN WITH MINERALS) tablet Take 1 tablet by mouth daily.    [provider]  omeprazole (PRILOSEC) 10 MG capsule Take 10 mg by mouth every other day. 03/21/19   [provider]  polyethylene glycol (MIRALAX / GLYCOLAX) 17 g packet Take 17 g by mouth at bedtime.    [provider]  potassium chloride SA (KLOR-CON M) 20 MEQ tablet Take 1 tablet (20 mEq total) by mouth daily. 06/17/23   Arrien, York Ram, MD  Probiotic Product (UP4 PROBIOTICS WOMENS PO) Take 1 capsule by mouth daily with breakfast.  [provider]      Allergies    Valium [diazepam], Amiodarone hcl [amiodarone], Compazine [prochlorperazine], Other, Thorazine [chlorpromazine], Zometa [zoledronic acid], Ciprofloxacin, Doxycycline, Zoster vac recomb adjuvanted, and Iodinated contrast media    Review of Systems   Review of Systems  Physical Exam Updated Vital Signs BP (!) 178/98   Pulse 80   Temp 97.7 F (36.5 C)   Resp 15   Ht 4\' 11"  (1.499 m)   Wt 54.4 kg   LMP 09/12/1973 (Approximate)   SpO2 91%   BMI 24.24 kg/m  Physical Exam Vitals and nursing note reviewed.  Constitutional:      General: She is not in acute distress.    Appearance: She is  well-developed.  HENT:     Head: Normocephalic and atraumatic.  Eyes:     Conjunctiva/sclera: Conjunctivae normal.     Pupils: Pupils are equal, round, and reactive to light.  Cardiovascular:     Rate and Rhythm: Normal rate and regular rhythm.     Heart sounds: No murmur heard. Pulmonary:     Effort: Pulmonary effort is normal. No respiratory distress.     Breath sounds: Normal breath sounds. No wheezing or rales.  Abdominal:     General: There is no distension.     Palpations: Abdomen is soft.     Tenderness: There is abdominal tenderness in the suprapubic area. There is no guarding or rebound.  Genitourinary:    Comments: Foley catheter present in the urethra with some mild swelling of the urethra.  Uterine prolapse present without any tenderness with palpation Musculoskeletal:        General: No tenderness. Normal range of motion.     Cervical back: Normal range of motion and neck supple.  Skin:    General: Skin is warm and dry.     Findings: No erythema or rash.  Neurological:     Mental Status: She is alert and oriented to person, place, and time. Mental status is at baseline.  Psychiatric:        Behavior: Behavior normal.     ED Results / Procedures / Treatments   Labs (all labs ordered are listed, but only abnormal results are displayed) Labs Reviewed - No data to display  EKG None  Radiology No results found.  Procedures Procedures    Medications Ordered in ED Medications - No data to display  ED Course/ Medical Decision Making/ A&P                                 Medical Decision Making  Pt with multiple medical problems and comorbidities and presenting today with a complaint that caries a high risk for morbidity and mortality.  Here today due to lack of catheter draining and significant abdominal discomfort.  Patient has no urine in her catheter bag and concern for obstruction.  Given the sudden nature of the symptoms lower suspicion for new UTI,  pyelonephritis.  No noted trauma.  Her uterus is soft and reducible and low suspicion that that is the cause of her symptoms today.  Also concern for bladder spasm.  Will place a new Foley catheter and reevaluate.  9:01 PM New Foley catheter was placed and is now draining clear yellow urine.  Patient reports she feels significantly better and has no further complaints.  Do not feel that patient needs any further testing at this time.  She has a follow-up  with urology on Monday.         Final Clinical Impression(s) / ED Diagnoses Final diagnoses:  Acute urinary retention    Rx / DC Orders ED Discharge Orders     None         Gwyneth Sprout, MD 08/02/23 2101

## 2023-08-03 DIAGNOSIS — R339 Retention of urine, unspecified: Secondary | ICD-10-CM | POA: Diagnosis not present

## 2023-08-07 DIAGNOSIS — R338 Other retention of urine: Secondary | ICD-10-CM | POA: Diagnosis not present

## 2023-08-09 ENCOUNTER — Other Ambulatory Visit: Payer: Self-pay | Admitting: Cardiology

## 2023-08-09 DIAGNOSIS — I4821 Permanent atrial fibrillation: Secondary | ICD-10-CM

## 2023-08-09 NOTE — Telephone Encounter (Signed)
Eliquis 2.5mg  refill request received. Patient is 89 years old, weight-54.4kg, Crea-1.15 on 06/29/23, Diagnosis-Afib (2016), and last seen Dr. Anne Fu on 07/05/23. Dose is appropriate based on dosing criteria. Will send in refill to requested pharmacy.

## 2023-08-11 ENCOUNTER — Other Ambulatory Visit: Payer: Self-pay

## 2023-08-11 ENCOUNTER — Encounter (HOSPITAL_BASED_OUTPATIENT_CLINIC_OR_DEPARTMENT_OTHER): Payer: Self-pay | Admitting: Emergency Medicine

## 2023-08-11 ENCOUNTER — Emergency Department (HOSPITAL_BASED_OUTPATIENT_CLINIC_OR_DEPARTMENT_OTHER)
Admission: EM | Admit: 2023-08-11 | Discharge: 2023-08-11 | Disposition: A | Discharge status: Home / Self Care | Payer: Medicare Other | Attending: Emergency Medicine | Admitting: Emergency Medicine

## 2023-08-11 DIAGNOSIS — I509 Heart failure, unspecified: Secondary | ICD-10-CM | POA: Insufficient documentation

## 2023-08-11 DIAGNOSIS — S8011XA Contusion of right lower leg, initial encounter: Secondary | ICD-10-CM | POA: Insufficient documentation

## 2023-08-11 DIAGNOSIS — X58XXXA Exposure to other specified factors, initial encounter: Secondary | ICD-10-CM | POA: Diagnosis not present

## 2023-08-11 DIAGNOSIS — Z7901 Long term (current) use of anticoagulants: Secondary | ICD-10-CM | POA: Diagnosis not present

## 2023-08-11 DIAGNOSIS — N183 Chronic kidney disease, stage 3 unspecified: Secondary | ICD-10-CM | POA: Insufficient documentation

## 2023-08-11 DIAGNOSIS — I130 Hypertensive heart and chronic kidney disease with heart failure and stage 1 through stage 4 chronic kidney disease, or unspecified chronic kidney disease: Secondary | ICD-10-CM | POA: Insufficient documentation

## 2023-08-11 DIAGNOSIS — Z79899 Other long term (current) drug therapy: Secondary | ICD-10-CM | POA: Diagnosis not present

## 2023-08-11 DIAGNOSIS — Z8673 Personal history of transient ischemic attack (TIA), and cerebral infarction without residual deficits: Secondary | ICD-10-CM | POA: Insufficient documentation

## 2023-08-11 DIAGNOSIS — M7989 Other specified soft tissue disorders: Secondary | ICD-10-CM | POA: Diagnosis present

## 2023-08-11 NOTE — Discharge Instructions (Signed)
You can place some gentle compression on the area with a compression sock but it will get better on its own.  No signs of blood clots today

## 2023-08-11 NOTE — ED Notes (Signed)
ED Provider at bedside. 

## 2023-08-11 NOTE — ED Provider Notes (Signed)
Adrienne [name] EMERGENCY DEPARTMENT AT [facility] Provider Note   CSN: [id number] Arrival date & time: 08/11/23  4401     History  Chief Complaint  Patient presents with   Leg Swelling    Adrienne [name] is a 89 y.o. female.  Pt is a 89y/o female with hx of severe bladder prolapse with chronic indwelling Foley, chronic atrial fibrillation on Eliquis, CHF, pulmonary hypertension, CAD, CKD 3, previous CVA who is presenting today due to swelling in the anterior lower leg (shin area) and minimal pain.  She noticed it yesterday and reports it was still there today.  She has not noticed any significant leg swelling or new discoloration.  She reports having a traumatic hematoma several years ago that discolored her lower leg chronically but it has not changed.  She has no pain in the upper leg.  No complaints of abdominal pain, trouble breathing, chest pain or shortness of breath today.  She has been compliant with her Eliquis.  She cannot think of any injury to her leg.  She is having no difficulty walking.  The history is provided by the patient.       Home Medications Prior to Admission medications   Medication Sig Start Date End Date Taking? Authorizing Provider  acetaminophen (TYLENOL) 500 MG tablet Take 1,000 mg by mouth every 12 (twelve) hours as needed for mild pain.    [provider]  albuterol (VENTOLIN HFA) 108 (90 Base) MCG/ACT inhaler Inhale 1 puff into the lungs every 6 (six) hours as needed for wheezing or shortness of breath (Cough). 09/08/22   Regalado, Belkys A, MD  allopurinol (ZYLOPRIM) 100 MG tablet Take 1 tablet (100 mg total) by mouth daily. 09/08/22   Regalado, Belkys A, MD  apixaban (ELIQUIS) 2.5 MG TABS tablet TAKE 1 TABLET TWICE A DAY 08/09/23   Jake Bathe, MD  ascorbic acid (VITAMIN C) 500 MG tablet Take 500 mg by mouth 2 (two) times daily.    [provider]  atorvastatin (LIPITOR) 10 MG tablet TAKE 1 TABLET DAILY 11/28/22    Jake Bathe, MD  B Complex Vitamins (VITAMIN B COMPLEX) CAPS Take 1 capsule by mouth daily.    [provider]  calcium carbonate (OSCAL) 1500 (600 Ca) MG TABS tablet Take 1,500 mg by mouth 2 (two) times daily with a meal.    [provider]  cyanocobalamin (VITAMIN B12) 1000 MCG tablet Take 1,000 mcg by mouth daily.    [provider]  docusate sodium (COLACE) 100 MG capsule Take 100 mg by mouth daily as needed. 06/10/23   [provider]  Ferrous Sulfate (IRON) 325 (65 Fe) MG TABS Take 325 mg by mouth daily. (OTC)    [provider]  furosemide (LASIX) 20 MG tablet Take 2 tablets (40 mg total) by mouth daily. 06/16/23   Arrien, York Ram, MD  metoprolol tartrate (LOPRESSOR) 100 MG tablet TAKE 1 TABLET TWICE A DAY 11/28/22   Jake Bathe, MD  Multiple Vitamins-Minerals (MULTIVITAMIN WITH MINERALS) tablet Take 1 tablet by mouth daily.    [provider]  omeprazole (PRILOSEC) 10 MG capsule Take 10 mg by mouth every other day. 03/21/19   [provider]  polyethylene glycol (MIRALAX / GLYCOLAX) 17 g packet Take 17 g by mouth at bedtime.    [provider]  potassium chloride SA (KLOR-CON M) 20 MEQ tablet Take 1 tablet (20 mEq total) by mouth daily. 06/17/23   Arrien, York Ram, MD  Probiotic Product (UP4 PROBIOTICS WOMENS PO) Take 1 capsule by mouth daily with breakfast.    [provider]      Allergies    Valium [diazepam], Amiodarone hcl [amiodarone], Compazine [prochlorperazine], Other, Thorazine [chlorpromazine], Zometa [zoledronic acid], Ciprofloxacin, Doxycycline, Zoster vac recomb adjuvanted, and Iodinated contrast media    Review of Systems   Review of Systems  Physical Exam Updated Vital Signs BP (!) 143/71   Pulse (!) 108   Temp 97.8 F (36.6 C) (Oral)   Resp 20   LMP 09/12/1973 (Approximate)   SpO2 95%  Physical Exam Vitals and nursing note reviewed.  Cardiovascular:     Rate and  Rhythm: Normal rate.  Pulmonary:     Effort: Pulmonary effort is normal.  Musculoskeletal:     Right lower leg: No edema.     Left lower leg: No edema.       Legs:     Comments: Chronic discoloration of the right lower extremity from the knee to the ankle.  Calf is soft and nontender.  No pain or swelling in the right medial thigh.  2+ right DP pulse  Neurological:     Mental Status: She is alert. Mental status is at baseline.     ED Results / Procedures / Treatments   Labs (all labs ordered are listed, but only abnormal results are displayed) Labs Reviewed - No data to display  EKG None  Radiology No results found.  Procedures Procedures    Medications Ordered in ED Medications - No data to display  ED Course/ Medical Decision Making/ A&P                                 Medical Decision Making  Patient presenting today with evidence of a hematoma to her right mid shin.  No findings to suggest infectious etiology such as abscess or cellulitis.  Low suspicion for DVT as patient has no calf or thigh tenderness.  She is anticoagulated with Eliquis and suspect she most likely bumped her leg on something which caused the hematoma.  Bedside ultrasound showed evidence of hematoma.  At this time patient has no other complaints.  Recommended compression, Tylenol as needed for pain patient is otherwise stable for discharge.        Final Clinical Impression(s) / ED Diagnoses Final diagnoses:  Hematoma of right lower leg    Rx / DC Orders ED Discharge Orders     None         Gwyneth Sprout, MD 08/11/23 1013

## 2023-08-11 NOTE — ED Triage Notes (Signed)
Pt c/o RLE swelling that started yesterday, reports leg color is normal for pt. Pt taking eliquis

## 2023-08-14 ENCOUNTER — Other Ambulatory Visit: Payer: Self-pay

## 2023-08-14 DIAGNOSIS — S8011XA Contusion of right lower leg, initial encounter: Secondary | ICD-10-CM | POA: Diagnosis not present

## 2023-08-14 MED ORDER — METOPROLOL TARTRATE 100 MG PO TABS: 100.0000 mg | ORAL_TABLET | Freq: Two times a day (BID) | ORAL | 3 refills | Status: AC

## 2023-08-14 MED ORDER — ATORVASTATIN CALCIUM 10 MG PO TABS: 10.0000 mg | ORAL_TABLET | Freq: Every day | ORAL | 3 refills | Status: AC

## 2023-08-15 ENCOUNTER — Ambulatory Visit (HOSPITAL_COMMUNITY): Payer: Medicare Other | Attending: Cardiology

## 2023-08-15 DIAGNOSIS — N1832 Chronic kidney disease, stage 3b: Secondary | ICD-10-CM | POA: Insufficient documentation

## 2023-08-15 DIAGNOSIS — I2510 Atherosclerotic heart disease of native coronary artery without angina pectoris: Secondary | ICD-10-CM | POA: Diagnosis not present

## 2023-08-15 DIAGNOSIS — I5032 Chronic diastolic (congestive) heart failure: Secondary | ICD-10-CM | POA: Diagnosis not present

## 2023-08-15 DIAGNOSIS — I2583 Coronary atherosclerosis due to lipid rich plaque: Secondary | ICD-10-CM | POA: Insufficient documentation

## 2023-08-15 DIAGNOSIS — I340 Nonrheumatic mitral (valve) insufficiency: Secondary | ICD-10-CM | POA: Insufficient documentation

## 2023-08-15 DIAGNOSIS — I4821 Permanent atrial fibrillation: Secondary | ICD-10-CM | POA: Insufficient documentation

## 2023-08-15 LAB — ECHOCARDIOGRAM COMPLETE
AV Mean grad: 4 mm[Hg]
AV Peak grad: 6.9 mm[Hg]
Ao pk vel: 1.31 m/s
MV M vel: 4.45 m/s
MV Peak grad: 79.2 mm[Hg]
P 1/2 time: 410 ms
S' Lateral: 3.2 cm

## 2023-08-21 ENCOUNTER — Telehealth: Payer: Self-pay | Admitting: Cardiology

## 2023-08-21 NOTE — Telephone Encounter (Signed)
Pt states she was told she would get a c/b from nurse after peaking with Dr. Anne Fu about echo results. She is a little concerned and waiting for a response. Please advise.

## 2023-08-22 ENCOUNTER — Telehealth: Payer: Self-pay | Admitting: *Deleted

## 2023-08-22 DIAGNOSIS — E7800 Pure hypercholesterolemia, unspecified: Secondary | ICD-10-CM | POA: Diagnosis not present

## 2023-08-22 DIAGNOSIS — I340 Nonrheumatic mitral (valve) insufficiency: Secondary | ICD-10-CM

## 2023-08-22 DIAGNOSIS — N390 Urinary tract infection, site not specified: Secondary | ICD-10-CM | POA: Diagnosis not present

## 2023-08-22 DIAGNOSIS — M109 Gout, unspecified: Secondary | ICD-10-CM | POA: Diagnosis not present

## 2023-08-22 DIAGNOSIS — G4734 Idiopathic sleep related nonobstructive alveolar hypoventilation: Secondary | ICD-10-CM | POA: Diagnosis not present

## 2023-08-22 DIAGNOSIS — D649 Anemia, unspecified: Secondary | ICD-10-CM | POA: Diagnosis not present

## 2023-08-22 DIAGNOSIS — N1832 Chronic kidney disease, stage 3b: Secondary | ICD-10-CM | POA: Diagnosis not present

## 2023-08-22 DIAGNOSIS — I5032 Chronic diastolic (congestive) heart failure: Secondary | ICD-10-CM | POA: Diagnosis not present

## 2023-08-22 DIAGNOSIS — Z23 Encounter for immunization: Secondary | ICD-10-CM | POA: Diagnosis not present

## 2023-08-22 DIAGNOSIS — R7303 Prediabetes: Secondary | ICD-10-CM | POA: Diagnosis not present

## 2023-08-22 NOTE — Telephone Encounter (Signed)
-----   Message from [place] sent at 08/21/2023  9:29 PM EST ----- Minimally changed MR over the past year and half. Moderate to severe. Let's repeat ECHO again in one year ----- Message ----- From: Beatrice Lecher, PA-C Sent: 08/16/2023   1:19 PM EST To: Jake Bathe, MD; Elliot Cousin, RMA  Echocardiogram demonstrates normal heart function (ejection fraction).  Leakage of the mitral valve (mitral gravitation) appears to be worse.  I will review further with Dr. Anne Fu. PLAN:  -Keep follow-up as planned. Tereso Newcomer, PA-C    08/16/2023 1:17 PM

## 2023-08-22 NOTE — Telephone Encounter (Signed)
Returned call to pt and made her aware that when we heard back from Dr. Anne Fu, and if he had any new recommendations, we would notify her.

## 2023-08-28 DIAGNOSIS — N3020 Other chronic cystitis without hematuria: Secondary | ICD-10-CM | POA: Diagnosis not present

## 2023-08-28 DIAGNOSIS — R338 Other retention of urine: Secondary | ICD-10-CM | POA: Diagnosis not present

## 2023-08-29 ENCOUNTER — Other Ambulatory Visit: Payer: Self-pay | Admitting: Pharmacist

## 2023-08-29 NOTE — Progress Notes (Signed)
08/29/2023 Name: Adrienne [name] MRN: [id number] DOB: [birth year]  Chief Complaint  Patient presents with   Medication Management    Adrienne [name] is a 89 y.o. year old female who presented for a telephone visit.   They were referred to the pharmacist by their PCP for assistance in managing  SDOH concerns + several ED visits + chronic UTI .   3 month ED history:  11/29: Left shin swelling- no changes as reported to be hematoma 11/20: Pelvic discomfort due to foley not draining- switched foley 10/3: Acute CHF from Lasix dose change- given IV Lasix 9/21: Dysuria with foley- IV Ceftriaxone due to recurrent UTI history  *Biggest reason for ED visits are CHF + chronic UTI- advised patient calls office for swelling and foley issues   Subjective:  Care Team: Primary Care Provider: Sigmund Hazel, MD ; Next Scheduled Visit: 11/21/23 Clinical Pharmacist: Marlowe Aschoff, PharmD  Medication Access/Adherence  Current Pharmacy:  [facility] [phone] - [place], [place] - [address] [facility] [address] [place] [address] Phone: [phone] Fax: [phone]  [facility] [address] [place] [address] Phone: [phone] Fax: [phone]  [facility] [address] [place] [address] Phone: [phone] Fax: [phone]  [facility] - [place], [place] - [address] [place] [address] Phone: [phone] Fax: [phone]   Patient reports affordability concerns with their medications: No  Patient reports access/transportation concerns to their pharmacy: No  Patient reports adherence concerns with their medications:  No     Medication Management + Health Review: - Good adherence to medications noted - CHF is well managed at this time with weight of 118 lbs- advised to  monitor BP as well - Chronic UTI- patient was given Bactrim yesterday at [facility] due to symptoms  SDOH Concerns: - No cost issues noted - Gets groceries delivered from [employer] medications from [employer] near daughter's house- discussed switching to mail order, however with antibiotics constantly being sent to pharmacy the mail order would not be best option - Able to get around the home with no issues with walker- most recent fall was hitting her shoulder when getting up in the middle of the night - Getting assistance from [place] on Mon, Wed, Fri- they assist with personal care and home aide help  *Daughter lives nearby and drives them to appointments- she has discussed option of Uber/Lyft with them before   Objective:  Lab Results  Component Value Date   HGBA1C 6.1 (H) 12/15/2022    Lab Results  Component Value Date   CREATININE 1.15 (H) 06/29/2023   BUN 26 (H) 06/29/2023   NA 135 06/29/2023   K 4.6 06/29/2023   CL 99 06/29/2023   CO2 30 06/29/2023    Lab Results  Component Value Date   CHOL 128 05/06/2019   HDL 50 05/06/2019   LDLCALC 58 05/06/2019   TRIG 101 05/06/2019   CHOLHDL 2.6 05/06/2019    Medications Reviewed Today   Medications were not reviewed in this encounter       Assessment/Plan:   Medication Management: - Currently strategy sufficient to maintain appropriate adherence to prescribed medication regimen - Patient will let me know if she changes her mind regarding [facility] vs mail order pharmacy  Neurogenic Bladder Review: - Using foley catheter- gets changed  monthly at [facility] - Urinary tract is colonized per patient - Tried prophylaxis: Macrobid 100mg  daily, Keflex daily, and Trimethoprim daily - Taking Probiotic daily as recommended- also discussed D-mannose and cranberry benefits prior - Tried Tamsulosin as well   *Summary for PCP:* - Follow-up in 1.5 months to see how patient is doing- history of ED visits  for CHF + chronic UTI due to foley - Explained if situation changes in the future and she decides to switch to mail order pharmacy to let me know- difficulty due to consistent antibiotic needs right now - Advised to call [facility] back to see if they can assist with foley changes and the differences from [place] care provided on Mon, Wed, Fri   Kollyns Mickelson, PharmD [facility] Phone Number: [phone]

## 2023-09-08 DIAGNOSIS — R338 Other retention of urine: Secondary | ICD-10-CM | POA: Diagnosis not present

## 2023-09-08 DIAGNOSIS — N3020 Other chronic cystitis without hematuria: Secondary | ICD-10-CM | POA: Diagnosis not present

## 2023-09-08 DIAGNOSIS — N952 Postmenopausal atrophic vaginitis: Secondary | ICD-10-CM | POA: Diagnosis not present

## 2023-09-14 DIAGNOSIS — N3020 Other chronic cystitis without hematuria: Secondary | ICD-10-CM | POA: Diagnosis not present

## 2023-09-14 DIAGNOSIS — N8111 Cystocele, midline: Secondary | ICD-10-CM | POA: Diagnosis not present

## 2023-09-24 NOTE — Progress Notes (Signed)
 Cardiology Office Note:    Date:  09/25/2023  ID:  Adrienne [name], DOB [birth year], MRN [id number] PCP: Cleotilde Planas, MD  [facility] Providers Cardiologist:  Oneil Parchment, MD       Patient Profile:      Coronary artery Ca2+ on CT  Notes indicate LHC in 10/2011 in [place]: nonobstructive CAD Permanent atrial fibrillation  Wt < 60 kg, Age > 80: Apixaban  2.5 mg twice daily  (HFpEF) heart failure with preserved ejection fraction  Left Bundle Branch Block  Mod mitral regurgitation  Atrial functional MR TTE 08/18/21: EF 55-60, mod MR  TTE 12/09/21: EF 55-60, mod MR TTE 09/04/22: EF 50-55, global HK, mod LVH, mildly reduced RVSF, mild LAE, mild-mod RAE, degen MV w mod to severe MR, AV sclerosis  TTE 05/04/23: EF 60-65, no RWMA, mild LVh, mildly reduced RVSF, mod RVE, mod pulm HTN, RVSP 56.2, mod LAE, severe RAE, mod MR, mod TR, triival AI, AV sclerosis, RAP 15 TTE 08/15/23: EF 55-60, no RWMA, Gr 1 DD, RVSP 51, mildly reduced RVSF, severe BAE, severe MR, mod TR, mild AI, RAP 3  Carotid US  12/06/17: Bilat ICA 1-39  Hx of TIA S/p L MCA CVA s/p thrombectomy (while off A/c due to GI bleeding) Pulmonary hypertension  Hypertension  Hyperlipidemia  Chronic kidney disease  Anemia  GERD Hx of uterine prolapse, chronic indwelling Foley cath R Breast CA          History of Present Illness:  Discussed the use of AI scribe software for clinical note transcription with the patient, who gave verbal consent to proceed.  Adrienne [name] is a 89 y.o. female who returns for follow up of CHF, AFib, MR. She was last seen by Dr. Parchment in 06/2023. She has had multiple admissions and trips to the ED in the last 6 mos for acute CHF and UTI. She is here with her husband. She reports no recent exacerbation of shortness of breath, which she experiences when overactive. Her dyspnea has improved since her Lasix  dosage was increased from 20mg  to 40mg  daily. She denies any chest discomfort,  dizziness, or bleeding. However, she reports some vaginal inflammation, possibly related to her frequent UTIs. She has an indwelling catheter. The patient's weight has been stable, fluctuating between 115 and 118 pounds at home. She sleeps with a flat pillow and denies any breathing difficulties when lying flat. She also denies any leg swelling.     Review of Systems  Gastrointestinal:  Negative for hematochezia and melena.  Genitourinary:  Negative for hematuria.  -See HPI    Studies Reviewed:       Labs reviewed-Per [facility] 08/22/2023: Total cholesterol 106, HDL 47, LDL 40, triglycerides 100, Hgb 11.8, K 4.6, ALT 12 06/29/2023: Creatinine 1.15         Risk Assessment/Calculations:    CHA2DS2-VASc Score = 8   This indicates a 10.8% annual risk of stroke. The patient's score is based upon: CHF History: 1 HTN History: 1 Diabetes History: 0 Stroke History: 2 Vascular Disease History: 1 Age Score: 2 Gender Score: 1            Physical Exam:   VS:  BP 126/72 (BP Location: Left Arm, Patient Position: Sitting, Cuff Size: Normal)   Pulse 78   Ht 4' 11 (1.499 m)   Wt 121 lb (54.9 kg)   LMP 09/12/1973 (Approximate)   SpO2 98%   BMI 24.44 kg/m    Wt Readings from Last 3 Encounters:  09/25/23 121 lb (54.9 kg)  08/02/23 120 lb (54.4 kg)  07/05/23 128 lb (58.1 kg)    Constitutional:      Appearance: Healthy appearance. Not in distress.  Neck:     Vascular: No JVR.  Pulmonary:     Breath sounds: Normal breath sounds. No wheezing. No rales.  Cardiovascular:     Normal rate. Regular rhythm.     Murmurs: There is a grade 3/6 holosystolic murmur at the LLSB, radiating to the axilla.  Edema:    Peripheral edema present.    Ankle: bilateral trace edema of the ankle. Abdominal:     Palpations: Abdomen is soft.        Assessment and Plan:   Assessment & Plan Chronic heart failure with preserved ejection fraction (HCC) EF 55-60 by echocardiogram December 2024.  NYHA IIb.   Volume status overall stable.  Weights at home have been stable.  She has not been hospitalized for heart failure since October. -Continue furosemide  40 mg daily, potassium 20 mEq daily. -Follow-up 6 months.  Stage 3b chronic kidney disease (HCC) Creatinine stable in October 2024.  Permanent atrial fibrillation (HCC) Rate overall well-controlled.  She is tolerating anticoagulation.  Hemoglobin stable in December 2024.  Based upon age and weight, she should be on Eliquis  2.5 mg twice daily. -Continue Eliquis  2.5 mg twice daily, metoprolol  tartrate 100 mg twice daily -Follow-up 6 months  Mitral valve insufficiency, unspecified etiology Severe mitral regurgitation, moderate tricuspid regurgitation, mild aortic insufficiency noted by echocardiogram in December 2024.  She has moderate pulmonary pretension.  I reviewed this with Dr. Jeffrie.  We will continue medical therapy and surveillance with annual echocardiograms.  She has another echocardiogram pending December 2025. Essential hypertension Blood pressure well-controlled.  Continue metoprolol  tartrate 100 mg twice daily.  Pure hypercholesterolemia LDL optimal in December 2024. -Continue Lipitor 10 mg daily.        Dispo:  Return in about 6 months (around 03/24/2024) for Routine Follow Up, w/ Dr. Jeffrie.  Signed, Glendia Ferrier, PA-C

## 2023-09-25 ENCOUNTER — Encounter: Payer: Self-pay | Admitting: Physician Assistant

## 2023-09-25 ENCOUNTER — Ambulatory Visit: Payer: Medicare Other | Attending: Physician Assistant | Admitting: Physician Assistant

## 2023-09-25 VITALS — BP 126/72 | HR 78 | Ht 59.0 in | Wt 121.0 lb

## 2023-09-25 DIAGNOSIS — N1832 Chronic kidney disease, stage 3b: Secondary | ICD-10-CM | POA: Insufficient documentation

## 2023-09-25 DIAGNOSIS — I10 Essential (primary) hypertension: Secondary | ICD-10-CM | POA: Diagnosis not present

## 2023-09-25 DIAGNOSIS — I5032 Chronic diastolic (congestive) heart failure: Secondary | ICD-10-CM | POA: Insufficient documentation

## 2023-09-25 DIAGNOSIS — I4821 Permanent atrial fibrillation: Secondary | ICD-10-CM | POA: Insufficient documentation

## 2023-09-25 DIAGNOSIS — I340 Nonrheumatic mitral (valve) insufficiency: Secondary | ICD-10-CM | POA: Diagnosis not present

## 2023-09-25 DIAGNOSIS — E7800 Pure hypercholesterolemia, unspecified: Secondary | ICD-10-CM | POA: Insufficient documentation

## 2023-09-25 NOTE — Assessment & Plan Note (Signed)
 Creatinine stable in October 2024.

## 2023-09-25 NOTE — Assessment & Plan Note (Signed)
 LDL optimal in December 2024. -Continue Lipitor 10 mg daily.

## 2023-09-25 NOTE — Assessment & Plan Note (Signed)
 Blood pressure well-controlled.  Continue metoprolol tartrate 100 mg twice daily.

## 2023-09-25 NOTE — Patient Instructions (Signed)
Medication Instructions:  Your physician recommends that you continue on your current medications as directed. Please refer to the Current Medication list given to you today.  *If you need a refill on your cardiac medications before your next appointment, please call your pharmacy*   Lab Work: None ordered  If you have labs (blood work) drawn today and your tests are completely normal, you will receive your results only by: MyChart Message (if you have MyChart) OR A paper copy in the mail If you have any lab test that is abnormal or we need to change your treatment, we will call you to review the results.   Testing/Procedures: None ordered   Follow-Up: At [facility], you and your health needs are our priority.  As part of our continuing mission to provide you with exceptional heart care, we have created designated Provider Care Teams.  These Care Teams include your primary Cardiologist (physician) and Advanced Practice Providers (APPs -  Physician Assistants and Nurse Practitioners) who all work together to provide you with the care you need, when you need it.  We recommend signing up for the patient portal called "MyChart".  Sign up information is provided on this After Visit Summary.  MyChart is used to connect with patients for Virtual Visits (Telemedicine).  Patients are able to view lab/test results, encounter notes, upcoming appointments, etc.  Non-urgent messages can be sent to your provider as well.   To learn more about what you can do with MyChart, go to [email or web address].    Your next appointment:   6 month(s)  Provider:   Mark Skains, MD     Other Instructions   

## 2023-09-25 NOTE — Assessment & Plan Note (Signed)
 Severe mitral regurgitation, moderate tricuspid regurgitation, mild aortic insufficiency noted by echocardiogram in December 2024.  She has moderate pulmonary pretension.  I reviewed this with Dr. Jeffrie.  We will continue medical therapy and surveillance with annual echocardiograms.  She has another echocardiogram pending December 2025.

## 2023-09-25 NOTE — Assessment & Plan Note (Signed)
 Rate overall well-controlled.  She is tolerating anticoagulation.  Hemoglobin stable in December 2024.  Based upon age and weight, she should be on Eliquis  2.5 mg twice daily. -Continue Eliquis  2.5 mg twice daily, metoprolol  tartrate 100 mg twice daily -Follow-up 6 months

## 2023-09-25 NOTE — Assessment & Plan Note (Signed)
 EF 55-60 by echocardiogram December 2024.  NYHA IIb.  Volume status overall stable.  Weights at home have been stable.  She has not been hospitalized for heart failure since October. -Continue furosemide  40 mg daily, potassium 20 mEq daily. -Follow-up 6 months.

## 2023-10-02 DIAGNOSIS — N3020 Other chronic cystitis without hematuria: Secondary | ICD-10-CM | POA: Diagnosis not present

## 2023-10-02 DIAGNOSIS — R338 Other retention of urine: Secondary | ICD-10-CM | POA: Diagnosis not present

## 2023-10-09 DIAGNOSIS — R338 Other retention of urine: Secondary | ICD-10-CM | POA: Diagnosis not present

## 2023-10-10 ENCOUNTER — Other Ambulatory Visit: Payer: Self-pay | Admitting: Pharmacist

## 2023-10-10 NOTE — Progress Notes (Signed)
10/10/2023 Name: Adrienne [name] MRN: [id number] DOB: 06/19/1928  Chief Complaint  Patient presents with   Medication Management    Adrienne [name] is a 89 y.o. year old female who presented for a telephone visit.   They were referred to the pharmacist by their PCP for assistance in managing  SDOH concerns + several ED visits + chronic UTI .   3 month ED history:  11/29: Left shin swelling- no changes as reported to be hematoma 11/20: Pelvic discomfort due to foley not draining- switched foley 10/3: Acute CHF from Lasix dose change- given IV Lasix 9/21: Dysuria with foley- IV Ceftriaxone due to recurrent UTI history  *Biggest reason for ED visits are CHF + chronic UTI- advised patient calls office for swelling and foley issues   Subjective:  Care Team: Primary Care Provider: Sigmund Hazel, MD ; Next Scheduled Visit: 11/21/23 Clinical Pharmacist: Marlowe Aschoff, PharmD  Medication Access/Adherence  Current Pharmacy:  [facility] [phone] - [place], [place] - [address] AT [facility] [address] [place] [address] Phone: [phone] Fax: [phone]  [facility] [address] [place] [address] Phone: [phone] Fax: [phone]  [facility] [address] [place] [address] Phone: [phone] Fax: [phone]   Patient reports affordability concerns with their medications: No  Patient reports access/transportation concerns to their pharmacy: No  Patient reports adherence concerns with their medications:  No     Medication Management + Health Review: - Good adherence to medications noted - CHF is well managed at this time - Chronic UTI  SDOH Concerns: - No cost issues noted - Gets groceries delivered from [employer] medications from [employer] near daughter's house- discussed switching to mail order,  however with antibiotics constantly being sent to pharmacy the mail order would not be best option - Able to get around the home with no issues with walker- most recent fall was hitting her shoulder when getting up in the middle of the night - Getting assistance from [facility] on Mon, Wed, Fri- they assist with personal care and home aide help  *Daughter lives nearby and drives them to appointments- she has discussed option of Uber/Lyft with them before   Objective:  Lab Results  Component Value Date   HGBA1C 6.1 (H) 12/15/2022    Lab Results  Component Value Date   CREATININE 1.15 (H) 06/29/2023   BUN 26 (H) 06/29/2023   NA 135 06/29/2023   K 4.6 06/29/2023   CL 99 06/29/2023   CO2 30 06/29/2023    Lab Results  Component Value Date   CHOL 128 05/06/2019   HDL 50 05/06/2019   LDLCALC 58 05/06/2019   TRIG 101 05/06/2019   CHOLHDL 2.6 05/06/2019    Medications Reviewed Today   Medications were not reviewed in this encounter       Assessment/Plan:   Medication Management: - Currently strategy sufficient to maintain appropriate adherence to prescribed medication regimen - Patient will let me know if she changes her mind regarding [facility] vs mail order pharmacy  Neurogenic Bladder Review: - Using foley catheter- gets changed monthly at [facility] - Urinary tract is colonized per patient - Tried prophylaxis: Macrobid 100mg  daily, Keflex daily, and Trimethoprim daily - Taking Probiotic daily as recommended- also discussed D-mannose and cranberry benefits prior - Tried Tamsulosin as well - Can't use creams with estrogen due to  history of breast cancer   *Summary for PCP:* - Follow-up in 1.5 months to see how patient is doing- history of ED visits for CHF + chronic UTI due to foley - Explained if situation changes in the future and she decides to switch to mail order pharmacy to let me know- difficulty due to consistent antibiotic needs right now - Advised to  call Palliative [facility] back to see if they can assist with foley changes and the differences from [facility] care provided on Mon, Wed, Fri - Recent treatments: Macrobid on 09/18/23 for 7DS, Fluconazole 09/22/23 for 3 tablets, 123 ointment (Nyst/Tac/Aquap) - Reports continued pain but the ointment has helped a lot **Main concern today is loss of appetite and rapid weight loss- currently at 10lbs loss in 15 days (121 lbs at Cardiology on 09/25/23)   Marlowe Aschoff, PharmD [facility] Phone Number: [phone]

## 2023-10-31 DIAGNOSIS — R194 Change in bowel habit: Secondary | ICD-10-CM | POA: Diagnosis not present

## 2023-10-31 DIAGNOSIS — R634 Abnormal weight loss: Secondary | ICD-10-CM | POA: Diagnosis not present

## 2023-10-31 DIAGNOSIS — R946 Abnormal results of thyroid function studies: Secondary | ICD-10-CM | POA: Diagnosis not present

## 2023-11-13 DIAGNOSIS — R338 Other retention of urine: Secondary | ICD-10-CM | POA: Diagnosis not present

## 2023-11-16 ENCOUNTER — Telehealth: Payer: Self-pay | Admitting: Cardiology

## 2023-11-16 MED ORDER — FUROSEMIDE 40 MG PO TABS: 40.0000 mg | ORAL_TABLET | Freq: Every day | ORAL | 2 refills | Status: AC

## 2023-11-16 NOTE — Telephone Encounter (Signed)
 Confirmed w/ pt.  Rx sent to requested pharmacy.  Patient verbalized understanding and agreeable to plan.

## 2023-11-16 NOTE — Telephone Encounter (Signed)
 Pt c/o medication issue:  1. Name of Medication: furosemide (LASIX) 20 MG tablet   2. How are you currently taking this medication (dosage and times per day)? As written   3. Are you having a reaction (difficulty breathing--STAT)? No   4. What is your medication issue? Pt called in stating she needs refill on this med, but she would like the rx changed to 40 mg tablets once daily, instead of 20mg  tablets 2x daily, please advise.

## 2023-11-16 NOTE — Telephone Encounter (Signed)
 Left message to call back

## 2023-11-21 DIAGNOSIS — Z0000 Encounter for general adult medical examination without abnormal findings: Secondary | ICD-10-CM | POA: Diagnosis not present

## 2023-11-21 DIAGNOSIS — Z978 Presence of other specified devices: Secondary | ICD-10-CM | POA: Diagnosis not present

## 2023-11-21 DIAGNOSIS — E038 Other specified hypothyroidism: Secondary | ICD-10-CM | POA: Diagnosis not present

## 2023-11-21 DIAGNOSIS — I5032 Chronic diastolic (congestive) heart failure: Secondary | ICD-10-CM | POA: Diagnosis not present

## 2023-11-21 DIAGNOSIS — Z515 Encounter for palliative care: Secondary | ICD-10-CM | POA: Diagnosis not present

## 2023-11-21 DIAGNOSIS — N1831 Chronic kidney disease, stage 3a: Secondary | ICD-10-CM | POA: Diagnosis not present

## 2023-11-27 DIAGNOSIS — R051 Acute cough: Secondary | ICD-10-CM | POA: Diagnosis not present

## 2023-11-27 DIAGNOSIS — N1831 Chronic kidney disease, stage 3a: Secondary | ICD-10-CM | POA: Diagnosis not present

## 2023-11-27 DIAGNOSIS — G4734 Idiopathic sleep related nonobstructive alveolar hypoventilation: Secondary | ICD-10-CM | POA: Diagnosis not present

## 2023-12-05 DIAGNOSIS — R051 Acute cough: Secondary | ICD-10-CM | POA: Diagnosis not present

## 2023-12-07 ENCOUNTER — Ambulatory Visit
Admission: RE | Admit: 2023-12-07 | Discharge: 2023-12-07 | Disposition: A | Discharge status: Home / Self Care | Source: Ambulatory Visit | Attending: Hematology and Oncology

## 2023-12-07 DIAGNOSIS — Z08 Encounter for follow-up examination after completed treatment for malignant neoplasm: Secondary | ICD-10-CM | POA: Diagnosis not present

## 2023-12-07 DIAGNOSIS — Z853 Personal history of malignant neoplasm of breast: Secondary | ICD-10-CM | POA: Diagnosis not present

## 2023-12-07 DIAGNOSIS — Z170 Estrogen receptor positive status [ER+]: Secondary | ICD-10-CM

## 2023-12-08 DIAGNOSIS — R053 Chronic cough: Secondary | ICD-10-CM | POA: Diagnosis not present

## 2023-12-12 DIAGNOSIS — R338 Other retention of urine: Secondary | ICD-10-CM | POA: Diagnosis not present

## 2023-12-22 ENCOUNTER — Encounter: Payer: Self-pay | Admitting: Nurse Practitioner

## 2023-12-22 NOTE — Progress Notes (Signed)
 This encounter was created in error - please disregard.

## 2023-12-25 DIAGNOSIS — I5032 Chronic diastolic (congestive) heart failure: Secondary | ICD-10-CM | POA: Diagnosis not present

## 2023-12-25 DIAGNOSIS — N1831 Chronic kidney disease, stage 3a: Secondary | ICD-10-CM | POA: Diagnosis not present

## 2023-12-25 DIAGNOSIS — E038 Other specified hypothyroidism: Secondary | ICD-10-CM | POA: Diagnosis not present

## 2023-12-25 DIAGNOSIS — Z713 Dietary counseling and surveillance: Secondary | ICD-10-CM | POA: Diagnosis not present

## 2024-01-04 ENCOUNTER — Telehealth: Payer: Self-pay | Admitting: Pharmacist

## 2024-01-04 NOTE — Progress Notes (Signed)
   01/04/2024  Patient ID: Adrienne [name], female   DOB: 06/26/1928, 89 y.o.   MRN: [id number]  Called and spoke with the patient on the phone today. Reporting still having a cough (started around 3/17, so has been 1+ months now).  Has improved, but cough still present with occasional coughing and sneezing spells (unknown if sneezing related moreover to allergies though). Uses a cough drop before bedtime to help and no coughing fits waking her up at night currently. Said cough syrup has helped the most and still has some- takes as needed. Not using albuterol  as no issues with breathing currently per patient.  Weight has been 111 lbs over the past few days.    Sent message to PCP regarding concern.    Delvin File, PharmD [facility]  Phone Number: [phone]

## 2024-01-22 DIAGNOSIS — R338 Other retention of urine: Secondary | ICD-10-CM | POA: Diagnosis not present

## 2024-01-25 DIAGNOSIS — Z6824 Body mass index (BMI) 24.0-24.9, adult: Secondary | ICD-10-CM | POA: Diagnosis not present

## 2024-01-25 DIAGNOSIS — S161XXA Strain of muscle, fascia and tendon at neck level, initial encounter: Secondary | ICD-10-CM | POA: Diagnosis not present

## 2024-02-13 DIAGNOSIS — K219 Gastro-esophageal reflux disease without esophagitis: Secondary | ICD-10-CM | POA: Diagnosis not present

## 2024-02-13 DIAGNOSIS — R7303 Prediabetes: Secondary | ICD-10-CM | POA: Diagnosis not present

## 2024-02-13 DIAGNOSIS — N1831 Chronic kidney disease, stage 3a: Secondary | ICD-10-CM | POA: Diagnosis not present

## 2024-02-13 DIAGNOSIS — I480 Paroxysmal atrial fibrillation: Secondary | ICD-10-CM | POA: Diagnosis not present

## 2024-02-13 DIAGNOSIS — Z6824 Body mass index (BMI) 24.0-24.9, adult: Secondary | ICD-10-CM | POA: Diagnosis not present

## 2024-02-13 DIAGNOSIS — Z029 Encounter for administrative examinations, unspecified: Secondary | ICD-10-CM | POA: Diagnosis not present

## 2024-02-13 DIAGNOSIS — Z111 Encounter for screening for respiratory tuberculosis: Secondary | ICD-10-CM | POA: Diagnosis not present

## 2024-02-20 DIAGNOSIS — W228XXA Striking against or struck by other objects, initial encounter: Secondary | ICD-10-CM | POA: Diagnosis not present

## 2024-02-20 DIAGNOSIS — S81811A Laceration without foreign body, right lower leg, initial encounter: Secondary | ICD-10-CM | POA: Diagnosis not present

## 2024-02-20 DIAGNOSIS — Z23 Encounter for immunization: Secondary | ICD-10-CM | POA: Diagnosis not present

## 2024-02-22 DIAGNOSIS — R338 Other retention of urine: Secondary | ICD-10-CM | POA: Diagnosis not present

## 2024-02-23 ENCOUNTER — Telehealth: Payer: Self-pay | Admitting: Cardiology

## 2024-02-23 NOTE — Telephone Encounter (Signed)
 Spoke to [employer] with [facility] she was calling to get parameters when patient can take a extra Lasix .She needs a written prescription faxed to her at fax # [phone].Advised I will send message to Dr.Skains and his RN.

## 2024-02-23 NOTE — Telephone Encounter (Signed)
 Pt c/o medication issue:  1. Name of Medication: Furosemide  PRN  2. How are you currently taking this medication (dosage and times per day)?   3. Are you having a reaction (difficulty breathing--STAT)?   4. What is your medication issue? She needs to know when is patient suppose to take her extra Furosemide ? When she gains what amount of weight and what time frame  2 days, or a  week? Also needs an order to weigh patient

## 2024-02-26 DIAGNOSIS — Z515 Encounter for palliative care: Secondary | ICD-10-CM | POA: Diagnosis not present

## 2024-02-26 NOTE — Telephone Encounter (Signed)
 Order completed, sign by Dr Renna Cary and faxed to number requested.  Will be sent to be scanned into chart.

## 2024-02-26 NOTE — Telephone Encounter (Signed)
 Per Dr Dorothye Gathers Daily weights If weight increases 3 lbs over night Take extra Furosemide  40 mg daily for 3 days, then return to 40 mg daily.

## 2024-03-08 ENCOUNTER — Other Ambulatory Visit: Payer: Self-pay | Admitting: Cardiology

## 2024-03-08 DIAGNOSIS — I4821 Permanent atrial fibrillation: Secondary | ICD-10-CM

## 2024-03-08 NOTE — Telephone Encounter (Signed)
 Prescription refill request for Eliquis  received. Indication:AFIB Last office visit:1/25 Scr:1.15  10/24 Age: 89 Weight:54.9  KG  PRESCRIPTION REFILLED

## 2024-03-25 DIAGNOSIS — R3914 Feeling of incomplete bladder emptying: Secondary | ICD-10-CM | POA: Diagnosis not present

## 2024-03-28 DIAGNOSIS — M25511 Pain in right shoulder: Secondary | ICD-10-CM | POA: Diagnosis not present

## 2024-04-02 ENCOUNTER — Emergency Department (HOSPITAL_COMMUNITY)
Admission: EM | Admit: 2024-04-02 | Discharge: 2024-04-03 | Disposition: A | Discharge status: Home / Self Care | Source: Skilled Nursing Facility | Attending: Emergency Medicine | Admitting: Emergency Medicine

## 2024-04-02 DIAGNOSIS — Z853 Personal history of malignant neoplasm of breast: Secondary | ICD-10-CM | POA: Insufficient documentation

## 2024-04-02 DIAGNOSIS — H9193 Unspecified hearing loss, bilateral: Secondary | ICD-10-CM | POA: Diagnosis not present

## 2024-04-02 DIAGNOSIS — I2510 Atherosclerotic heart disease of native coronary artery without angina pectoris: Secondary | ICD-10-CM | POA: Diagnosis not present

## 2024-04-02 DIAGNOSIS — Z7901 Long term (current) use of anticoagulants: Secondary | ICD-10-CM | POA: Diagnosis not present

## 2024-04-02 DIAGNOSIS — T83511A Infection and inflammatory reaction due to indwelling urethral catheter, initial encounter: Secondary | ICD-10-CM | POA: Insufficient documentation

## 2024-04-02 DIAGNOSIS — K219 Gastro-esophageal reflux disease without esophagitis: Secondary | ICD-10-CM | POA: Diagnosis not present

## 2024-04-02 DIAGNOSIS — K5900 Constipation, unspecified: Secondary | ICD-10-CM | POA: Diagnosis not present

## 2024-04-02 DIAGNOSIS — Y732 Prosthetic and other implants, materials and accessory gastroenterology and urology devices associated with adverse incidents: Secondary | ICD-10-CM | POA: Diagnosis not present

## 2024-04-02 DIAGNOSIS — Z79899 Other long term (current) drug therapy: Secondary | ICD-10-CM | POA: Insufficient documentation

## 2024-04-02 DIAGNOSIS — I5030 Unspecified diastolic (congestive) heart failure: Secondary | ICD-10-CM | POA: Insufficient documentation

## 2024-04-02 DIAGNOSIS — R159 Full incontinence of feces: Secondary | ICD-10-CM | POA: Diagnosis not present

## 2024-04-02 DIAGNOSIS — I130 Hypertensive heart and chronic kidney disease with heart failure and stage 1 through stage 4 chronic kidney disease, or unspecified chronic kidney disease: Secondary | ICD-10-CM | POA: Diagnosis not present

## 2024-04-02 DIAGNOSIS — T83098A Other mechanical complication of other indwelling urethral catheter, initial encounter: Secondary | ICD-10-CM | POA: Diagnosis not present

## 2024-04-02 DIAGNOSIS — N1832 Chronic kidney disease, stage 3b: Secondary | ICD-10-CM | POA: Insufficient documentation

## 2024-04-02 DIAGNOSIS — M109 Gout, unspecified: Secondary | ICD-10-CM | POA: Diagnosis not present

## 2024-04-02 DIAGNOSIS — N390 Urinary tract infection, site not specified: Secondary | ICD-10-CM | POA: Diagnosis not present

## 2024-04-02 DIAGNOSIS — Z6823 Body mass index (BMI) 23.0-23.9, adult: Secondary | ICD-10-CM | POA: Diagnosis not present

## 2024-04-03 DIAGNOSIS — T83511A Infection and inflammatory reaction due to indwelling urethral catheter, initial encounter: Secondary | ICD-10-CM | POA: Diagnosis not present

## 2024-04-03 LAB — BASIC METABOLIC PANEL WITH GFR
Anion gap: 11 (ref 5–15)
BUN: 34 mg/dL — ABNORMAL HIGH (ref 8–23)
CO2: 28 mmol/L (ref 22–32)
Calcium: 9.2 mg/dL (ref 8.9–10.3)
Chloride: 95 mmol/L — ABNORMAL LOW (ref 98–111)
Creatinine, Ser: 1.36 mg/dL — ABNORMAL HIGH (ref 0.44–1.00)
GFR, Estimated: 36 mL/min — ABNORMAL LOW (ref >60)
Glucose, Bld: 116 mg/dL — ABNORMAL HIGH (ref 70–99)
Potassium: 4.1 mmol/L (ref 3.5–5.1)
Sodium: 134 mmol/L — ABNORMAL LOW (ref 135–145)

## 2024-04-03 LAB — CBC WITH DIFFERENTIAL/PLATELET
Abs Immature Granulocytes: 0.07 K/uL (ref 0.00–0.07)
Basophils Absolute: 0 K/uL (ref 0.0–0.1)
Basophils Relative: 0 %
Eosinophils Absolute: 0.1 K/uL (ref 0.0–0.5)
Eosinophils Relative: 1 %
HCT: 36.2 % (ref 36.0–46.0)
Hemoglobin: 11.7 g/dL — ABNORMAL LOW (ref 12.0–15.0)
Immature Granulocytes: 1 %
Lymphocytes Relative: 11 %
Lymphs Abs: 1.5 K/uL (ref 0.7–4.0)
MCH: 30.5 pg (ref 26.0–34.0)
MCHC: 32.3 g/dL (ref 30.0–36.0)
MCV: 94.3 fL (ref 80.0–100.0)
Monocytes Absolute: 1.1 K/uL — ABNORMAL HIGH (ref 0.1–1.0)
Monocytes Relative: 8 %
Neutro Abs: 10.3 K/uL — ABNORMAL HIGH (ref 1.7–7.7)
Neutrophils Relative %: 79 %
Platelets: 274 K/uL (ref 150–400)
RBC: 3.84 MIL/uL — ABNORMAL LOW (ref 3.87–5.11)
RDW: 14.2 % (ref 11.5–15.5)
WBC: 13 K/uL — ABNORMAL HIGH (ref 4.0–10.5)
nRBC: 0 % (ref 0.0–0.2)

## 2024-04-03 LAB — URINALYSIS, ROUTINE W REFLEX MICROSCOPIC
Bilirubin Urine: NEGATIVE
Glucose, UA: NEGATIVE mg/dL
Ketones, ur: NEGATIVE mg/dL
Nitrite: NEGATIVE
Protein, ur: 30 mg/dL — AB
RBC / HPF: >50 RBC/hpf (ref 0–5)
Specific Gravity, Urine: 1.008 (ref 1.005–1.030)
WBC, UA: >50 WBC/hpf (ref 0–5)
pH: 7 (ref 5.0–8.0)

## 2024-04-03 MED ORDER — CEPHALEXIN 500 MG PO CAPS
500.0000 mg | ORAL_CAPSULE | Freq: Once | ORAL | Status: AC
  Administered 2024-04-03: 500 mg via ORAL
  Filled 2024-04-03: qty 1

## 2024-04-03 MED ORDER — ACETAMINOPHEN 500 MG PO TABS
1000.0000 mg | ORAL_TABLET | Freq: Once | ORAL | Status: AC
  Administered 2024-04-03: 1000 mg via ORAL
  Filled 2024-04-03: qty 2

## 2024-04-03 MED ORDER — CEPHALEXIN 500 MG PO CAPS
500.0000 mg | ORAL_CAPSULE | Freq: Three times a day (TID) | ORAL | 0 refills | Status: AC
Start: 2024-04-03 — End: 2024-04-17

## 2024-04-03 NOTE — ED Triage Notes (Signed)
 Patient BIB [facility] from [place] c/o abdominal pain states the foley is not working.  See further nurses note.

## 2024-04-03 NOTE — ED Provider Notes (Signed)
 [place] EMERGENCY DEPARTMENT AT [facility] Provider Note  CSN: [id number] Arrival date & time: 04/02/24 2356  Chief Complaint(s) foley malfunction  HPI Adrienne [name] is a 89 y.o. female with a past medical history listed below including indwelling Foley catheter due to retention here for suprapubic pain and fullness.  Patient believes her catheter is clogged.  No nausea or vomiting.  No diarrhea.  Urine is foul-smelling.  HPI  Past Medical History Past Medical History:  Diagnosis Date   Afib (HCC)    Arthritis of ankle    CAD (coronary artery disease)    Cancer (HCC) 2012   breast cancer-right   Compression fracture of T12 vertebra (HCC)    Diverticulosis    Edema    Elevated uric acid in blood    GERD (gastroesophageal reflux disease)    GI bleed 12/2015   Hyperlipidemia    Hypertension    Long-term (current) use of anticoagulants    Moderate mitral regurgitation    Personal history of radiation therapy    Pulmonary HTN (HCC)    Echo 1/19: EF 60-65, no RWMA, Ao sclerosis, trivial AI, mild MR, mod BAE, mod TR, PASP 49   Stroke (HCC) 01/25/2016   TIA (transient ischemic attack) 09/2011   Urinary incontinence    Urinary retention    Patient Active Problem List   Diagnosis Date Noted   UTI (urinary tract infection) 06/03/2023   Cerebrovascular disease 05/05/2023   Hypokalemia 05/04/2023   Normocytic anemia 05/04/2023   Mitral regurgitation 04/20/2023   Hydronephrosis of left kidney 03/24/2023   CKD (chronic kidney disease), stage IIIb (HCC) 03/23/2023   UTI (urinary tract infection) due to urinary indwelling catheter (HCC) 02/04/2023   Hyponatremia 12/16/2022   AKI (acute kidney injury) (HCC) 12/16/2022   Acute cystitis with hydronephrosis due to indwelling foley catheter 12/15/2022   Acute respiratory failure with hypoxia (HCC) 09/03/2022   CAP (community acquired pneumonia) 09/03/2022   Catheter-associated urinary tract infection (HCC)  09/03/2022   Open knee wound 10/05/2021   (HFpEF) heart failure with preserved ejection fraction (HCC) 08/18/2021   Palliative care by specialist    Goals of care, counseling/discussion    DNR (do not resuscitate) discussion    Vaginal bleeding 08/08/2020   Uterine prolapse 08/08/2020   Vaginal erosion secondary to pessary use (HCC) 08/08/2020   Acute urinary retention 08/08/2020   Chronic respiratory failure (HCC) 08/08/2020   Multiple pulmonary nodules 05/24/2020   Cough 05/24/2020   Malignant neoplasm of upper-inner quadrant of left breast in female, estrogen receptor negative (HCC) 12/20/2019   Malignant neoplasm of overlapping sites of right breast in female, estrogen receptor positive (HCC) 08/14/2019   Osteopenia 08/14/2019   Diplopia 01/29/2016   GERD (gastroesophageal reflux disease) 01/29/2016   Cerebrovascular accident (CVA) due to embolism of left middle cerebral artery (HCC) 01/29/2016   Bleeding gastrointestinal    Gait disturbance, post-stroke 01/27/2016   Fall    Secondary hypertension, unspecified    Cerebral infarction due to thrombosis of left middle cerebral artery (HCC) s/p mechanical thrombectomy    Malnutrition of moderate degree 01/09/2016   GI bleed 01/06/2016   Permanent atrial fibrillation (HCC) 06/10/2015   Chronic anticoagulation 06/10/2015   Hyperlipidemia 06/10/2015   Essential hypertension 06/10/2015   History of stroke 06/10/2015   Coronary artery disease due to lipid rich plaque 06/10/2015   Elevated uric acid in blood    Arthritis of ankle    Pulmonary HTN (HCC)    Home  Medication(s) Prior to Admission medications   Medication Sig Start Date End Date Taking? Authorizing Provider  cephALEXin  (KEFLEX ) 500 MG capsule Take 1 capsule (500 mg total) by mouth 3 (three) times daily for 14 days. 04/03/24 04/17/24 Yes Maryuri Warnke, Raynell Moder, MD  acetaminophen  (TYLENOL ) 500 MG tablet Take 1,000 mg by mouth every 12 (twelve) hours as needed for mild pain.     [provider]  albuterol  (VENTOLIN  HFA) 108 (90 Base) MCG/ACT inhaler Inhale 1 puff into the lungs every 6 (six) hours as needed for wheezing or shortness of breath (Cough). 09/08/22   Regalado, Belkys A, MD  allopurinol  (ZYLOPRIM ) 100 MG tablet Take 1 tablet (100 mg total) by mouth daily. 09/08/22   Regalado, Belkys A, MD  ascorbic acid  (VITAMIN C ) 500 MG tablet Take 500 mg by mouth 2 (two) times daily.    [provider]  atorvastatin  (LIPITOR) 10 MG tablet Take 1 tablet (10 mg total) by mouth daily. 08/14/23   Jeffrie Oneil BROCKS, MD  B Complex Vitamins (VITAMIN B COMPLEX ) CAPS Take 1 capsule by mouth daily.    [provider]  calcium  carbonate (OSCAL) 1500 (600 Ca) MG TABS tablet Take 1,500 mg by mouth 2 (two) times daily with a meal.    [provider]  cyanocobalamin  (VITAMIN B12) 1000 MCG tablet Take 1,000 mcg by mouth daily.    [provider]  docusate sodium  (COLACE) 100 MG capsule Take 100 mg by mouth daily as needed. 06/10/23   [provider]  ELIQUIS  2.5 MG TABS tablet TAKE 1 TABLET TWICE A DAY 03/08/24   Jeffrie Oneil BROCKS, MD  Ferrous Sulfate  (IRON ) 325 (65 Fe) MG TABS Take 325 mg by mouth daily. (OTC)    [provider]  furosemide  (LASIX ) 40 MG tablet Take 1 tablet (40 mg total) by mouth daily. 11/16/23 02/14/24  Jeffrie Oneil BROCKS, MD  meloxicam (MOBIC) 15 MG tablet Take 15 mg by mouth daily. 09/14/23   [provider]  metoprolol  tartrate (LOPRESSOR ) 100 MG tablet Take 1 tablet (100 mg total) by mouth 2 (two) times daily. 08/14/23   Jeffrie Oneil BROCKS, MD  Multiple Vitamins-Minerals (MULTIVITAMIN WITH MINERALS) tablet Take 1 tablet by mouth daily.    [provider]  omeprazole (PRILOSEC) 10 MG capsule Take 10 mg by mouth every other day. 03/21/19   [provider]  polyethylene glycol (MIRALAX  / GLYCOLAX ) 17 g packet Take 17 g by mouth at bedtime.    [provider]  potassium chloride  SA (KLOR-CON  M) 20  MEQ tablet Take 1 tablet (20 mEq total) by mouth daily. 06/17/23   Arrien, Mauricio Daniel, MD  Probiotic Product (UP4 PROBIOTICS WOMENS PO) Take 1 capsule by mouth daily with breakfast.    [provider]  Allergies Valium [diazepam], Amiodarone hcl [amiodarone], Compazine [prochlorperazine], Other, Thorazine [chlorpromazine], Zometa [zoledronic acid], Ciprofloxacin , Doxycycline , Zoster vac recomb adjuvanted, and Iodinated contrast media  Review of Systems Review of Systems As noted in HPI  Physical Exam Vital Signs  I have reviewed the triage vital signs BP (!) 147/123 (BP Location: Left Arm)   Pulse 79   Temp 97.8 F (36.6 C) (Oral)   Resp 14   LMP 09/12/1973 (Approximate)   SpO2 97%   Physical Exam Vitals reviewed.  Constitutional:      General: She is not in acute distress.    Appearance: She is well-developed. She is not diaphoretic.  HENT:     Head: Normocephalic and atraumatic.     Right Ear: External ear normal.     Left Ear: External ear normal.     Nose: Nose normal.  Eyes:     General: No scleral icterus.    Conjunctiva/sclera: Conjunctivae normal.  Neck:     Trachea: Phonation normal.  Cardiovascular:     Rate and Rhythm: Normal rate and regular rhythm.  Pulmonary:     Effort: Pulmonary effort is normal. No respiratory distress.     Breath sounds: No stridor.  Abdominal:     General: There is no distension.     Tenderness: There is no abdominal tenderness.     Comments: Indwelling foley with small amount of cloudy, foul-smelling urine in bag, approx 50cc.  Musculoskeletal:        General: Normal range of motion.     Cervical back: Normal range of motion.  Neurological:     Mental Status: She is alert and oriented to person, place, and time.  Psychiatric:        Behavior: Behavior normal.     ED Results and  Treatments Labs (all labs ordered are listed, but only abnormal results are displayed) Labs Reviewed  CBC WITH DIFFERENTIAL/PLATELET - Abnormal; Notable for the following components:      Result Value   WBC 13.0 (*)    RBC 3.84 (*)    Hemoglobin 11.7 (*)    Neutro Abs 10.3 (*)    Monocytes Absolute 1.1 (*)    All other components within normal limits  BASIC METABOLIC PANEL WITH GFR - Abnormal; Notable for the following components:   Sodium 134 (*)    Chloride 95 (*)    Glucose, Bld 116 (*)    BUN 34 (*)    Creatinine, Ser 1.36 (*)    GFR, Estimated 36 (*)    All other components within normal limits  URINALYSIS, ROUTINE W REFLEX MICROSCOPIC - Abnormal; Notable for the following components:   APPearance CLOUDY (*)    Hgb urine dipstick MODERATE (*)    Protein, ur 30 (*)    Leukocytes,Ua LARGE (*)    Bacteria, UA RARE (*)    All other components within normal limits  URINE CULTURE  EKG  EKG Interpretation Date/Time:    Ventricular Rate:    PR Interval:    QRS Duration:    QT Interval:    QTC Calculation:   R Axis:      Text Interpretation:         Radiology No results found.  Medications Ordered in ED Medications  cephALEXin  (KEFLEX ) capsule 500 mg (has no administration in time range)  acetaminophen  (TYLENOL ) tablet 1,000 mg (1,000 mg Oral Given 04/03/24 0142)   Procedures Procedures  (including critical care time) Medical Decision Making / ED Course   Medical Decision Making Amount and/or Complexity of Data Reviewed Labs: ordered. Decision-making details documented in ED Course.  Risk OTC drugs. Prescription drug management. Decision regarding hospitalization.    Suprapubic discomfort with foul-smelling urine  Differential diagnosis considered Foley catheter exchanged and 500 cc of foul-smelling urine retrieved.  Sent for  urinalysis which is concerning for infection.  Culture sent to determine speciation's. Patient does have leukocytosis but not febrile or tachycardic. Mild renal sufficiency without overt AKI. Does not appear to be septic at this time.  After Foley was exchanged, patient reported feeling significantly better.  Abdomen is otherwise benign without concern for other serious intra-abdominal inflammatory/infectious process requiring imaging at this time.  Will treat with Keflex  and await for speciation from the urine culture.    Final Clinical Impression(s) / ED Diagnoses Final diagnoses:  Acute urinary tract infection  Infection due to urinary indwelling catheter [facility])   The patient appears reasonably screened and/or stabilized for discharge and I doubt any other medical condition or other [facility] requiring further screening, evaluation, or treatment in the ED at this time. I have discussed the findings, Dx and Tx plan with the patient/family who expressed understanding and agree(s) with the plan. Discharge instructions discussed at length. The patient/family was given strict return precautions who verbalized understanding of the instructions. No further questions at time of discharge.  Disposition: Discharge  Condition: Good  ED Discharge Orders          Ordered    cephALEXin  (KEFLEX ) 500 MG capsule  3 times daily        04/03/24 9666             Follow Up: Cleotilde Planas, MD [address] [place] [address] [phone]  Call  to schedule an appointment for close follow up    This chart was dictated using voice recognition software.  Despite best efforts to proofread,  errors can occur which can change the documentation meaning.    Trine Raynell Moder, MD 04/03/24 [phone]

## 2024-04-04 DIAGNOSIS — M19011 Primary osteoarthritis, right shoulder: Secondary | ICD-10-CM | POA: Diagnosis not present

## 2024-04-04 DIAGNOSIS — Z515 Encounter for palliative care: Secondary | ICD-10-CM | POA: Diagnosis not present

## 2024-04-06 LAB — URINE CULTURE: Culture: >=100000 — AB

## 2024-04-07 ENCOUNTER — Telehealth (HOSPITAL_BASED_OUTPATIENT_CLINIC_OR_DEPARTMENT_OTHER): Payer: Self-pay | Admitting: *Deleted

## 2024-04-07 NOTE — Progress Notes (Signed)
 ED Antimicrobial Stewardship Positive Culture Follow Up   Adrienne [name] is an 89 y.o. female who presented to [facility] on 04/02/2024 with a chief complaint of  Chief Complaint  Patient presents with   foley malfunction    Recent Results (from the past 720 hours)  Urine Culture     Status: Abnormal   Collection Time: 04/03/24  1:21 AM   Specimen: Urine, Catheterized  Result Value Ref Range Status   Specimen Description   Final    URINE, CATHETERIZED Performed at [facility], [address] [place], [place] [address]    Special Requests   Final    NONE Performed at [facility], [address] [place], [place] [address]    Culture (A)  Final    >=100,000 COLONIES/mL ESCHERICHIA COLI 30,000 COLONIES/mL PROTEUS MIRABILIS    Report Status 04/06/2024 FINAL  Final   Organism ID, Bacteria ESCHERICHIA COLI (A)  Final   Organism ID, Bacteria PROTEUS MIRABILIS (A)  Final      Susceptibility   Escherichia coli - MIC*    AMPICILLIN >=32 RESISTANT Resistant     CEFAZOLIN  >=64 RESISTANT Resistant     CEFEPIME <=0.12 SENSITIVE Sensitive     CEFTRIAXONE  0.5 SENSITIVE Sensitive     CIPROFLOXACIN  <=0.25 SENSITIVE Sensitive     GENTAMICIN <=1 SENSITIVE Sensitive     IMIPENEM <=0.25 SENSITIVE Sensitive     NITROFURANTOIN  64 INTERMEDIATE Intermediate     TRIMETH /SULFA  >=320 RESISTANT Resistant     AMPICILLIN/SULBACTAM >=32 RESISTANT Resistant     PIP/TAZO 64 INTERMEDIATE Intermediate ug/mL    * >=100,000 COLONIES/mL ESCHERICHIA COLI   Proteus mirabilis - MIC*    AMPICILLIN <=2 SENSITIVE Sensitive     CEFAZOLIN  <=4 SENSITIVE Sensitive     CEFEPIME <=0.12 SENSITIVE Sensitive     CEFTRIAXONE  <=0.25 SENSITIVE Sensitive     CIPROFLOXACIN  <=0.25 SENSITIVE Sensitive     GENTAMICIN <=1 SENSITIVE Sensitive     IMIPENEM 8 INTERMEDIATE Intermediate     NITROFURANTOIN  128 RESISTANT Resistant     TRIMETH /SULFA  <=20 SENSITIVE Sensitive      AMPICILLIN/SULBACTAM <=2 SENSITIVE Sensitive     PIP/TAZO <=4 SENSITIVE Sensitive ug/mL    * 30,000 COLONIES/mL PROTEUS MIRABILIS    [x]  Treated with cephalexin , organism resistant to prescribed antimicrobial []  Patient discharged originally without antimicrobial agent and treatment is now indicated  Plan: Stop cephalexin  Call patient to confirm cipro  allergy.  Listed as swelling in right elbow.  She appears to have tolerated Levaquin  on 02/07/23 while inpatient. OK to use cipro  if not a true allergic reaction.   New antibiotic prescription: Cipro  500mg  PO daily for 7 days.  ED Provider: Norleen Essex PA  Wanda Hasting PharmD, BCPS [facility] main pharmacy [phone] 04/07/2024 12:25 PM

## 2024-04-07 NOTE — Telephone Encounter (Signed)
 Post ED Visit - Positive Culture Follow-up: Successful Patient Follow-Up  Culture assessed and recommendations reviewed by:  []  Rankin Dee, Pharm.D. []  Venetia Gully, Pharm.D., BCPS AQ-ID []  Garrel Crews, Pharm.D., BCPS []  Almarie Lunger, Pharm.D., BCPS []  [place], [address].D., BCPS, AAHIVP []  Rosaline Bihari, Pharm.D., BCPS, AAHIVP []  Vernell Meier, PharmD, BCPS []  Latanya Hint, PharmD, BCPS []  Donald Medley, PharmD, BCPS [x]  Chrstine Shade, PharmD  Positive urine culture  []  Patient discharged without antimicrobial prescription and treatment is now indicated [x]  Organism is resistant to prescribed ED discharge antimicrobial []  Patient with positive blood cultures  Changes discussed with ED provider: Norleen Essex, PA New antibiotic prescription Cipro  500mg  PO daily x 7 days Stop Cephalexin   Pt is at [facility]. Spoke to Therapist, art. She asked that I fax the report and call the Rx to [facility] in [place] who delivers to the facility.   Called to [facility] in [place], [place]  Contacted facility, date 04/07/24, time 1315   Adrienne [name] 04/07/2024, 1:20 PM

## 2024-04-09 DIAGNOSIS — N390 Urinary tract infection, site not specified: Secondary | ICD-10-CM | POA: Diagnosis not present

## 2024-04-10 ENCOUNTER — Encounter: Payer: Self-pay | Admitting: Obstetrics and Gynecology

## 2024-04-10 ENCOUNTER — Encounter: Payer: Medicare Other | Admitting: Obstetrics and Gynecology

## 2024-04-10 ENCOUNTER — Ambulatory Visit (INDEPENDENT_AMBULATORY_CARE_PROVIDER_SITE_OTHER): Admitting: Obstetrics and Gynecology

## 2024-04-10 VITALS — BP 114/70 | HR 93 | Ht 59.0 in | Wt 109.0 lb

## 2024-04-10 DIAGNOSIS — C50212 Malignant neoplasm of upper-inner quadrant of left female breast: Secondary | ICD-10-CM | POA: Diagnosis not present

## 2024-04-10 DIAGNOSIS — N765 Ulceration of vagina: Secondary | ICD-10-CM

## 2024-04-10 DIAGNOSIS — N368 Other specified disorders of urethra: Secondary | ICD-10-CM | POA: Diagnosis not present

## 2024-04-10 DIAGNOSIS — N813 Complete uterovaginal prolapse: Secondary | ICD-10-CM | POA: Diagnosis not present

## 2024-04-10 DIAGNOSIS — N898 Other specified noninflammatory disorders of vagina: Secondary | ICD-10-CM

## 2024-04-10 DIAGNOSIS — Z01419 Encounter for gynecological examination (general) (routine) without abnormal findings: Secondary | ICD-10-CM

## 2024-04-10 DIAGNOSIS — N390 Urinary tract infection, site not specified: Secondary | ICD-10-CM | POA: Diagnosis not present

## 2024-04-10 DIAGNOSIS — C50811 Malignant neoplasm of overlapping sites of right female breast: Secondary | ICD-10-CM | POA: Diagnosis not present

## 2024-04-10 DIAGNOSIS — Z9289 Personal history of other medical treatment: Secondary | ICD-10-CM | POA: Diagnosis not present

## 2024-04-10 DIAGNOSIS — Z853 Personal history of malignant neoplasm of breast: Secondary | ICD-10-CM

## 2024-04-10 NOTE — Patient Instructions (Signed)
 For hydration of the vaginal skin, you may use Replens or Astroglide moisturizer or coconut oil.

## 2024-04-10 NOTE — Progress Notes (Signed)
 89 y.o. G21P3003 Married Caucasian female here for a breast and pelvic exam. Currently has a UTI, has been getting Rocephin  injections through [facility].   The patient is also followed for complete uterovaginal prolapse.  Has a foley catheter chronically due to prolapse and inability to use a pessary due to prior bleeding with this in the past.  She has her catheter changed once a month at [facility].   She is using a compounded cream prescribed by Dr. MacDiarmid to hydrate the vaginal mucosa due to the prolapse.  Has a little bit of bleeding occasionally.  No pain or discomfort.   No breast concerns.   Hx right mastectomy and left lumpectomy.  New dx of CHF.    Has weight loss.  Weighed 134 last year with her GYN exam.  She has been treating with Lasix  for her CHF and wonders if some of the weight loss is fluid related.    Patient and husband are in assisted living at [place].   PCP: Cleotilde Planas, MD   Patient's last menstrual period was 09/12/1973 (approximate).           Sexually active: No.  The current method of family planning is post menopausal status.    Menopausal hormone therapy:  n/a Exercising: Yes.    Pedaling  Smoker:  Former   OB History     Gravida  3   Para  3   Term  3   Preterm      AB      Living  3      SAB      IAB      Ectopic      Multiple      Live Births  3           HEALTH MAINTENANCE: Last 2 paps: 03/31/22 neg, 03/11/19 neg  History of abnormal Pap or positive HPV:  no Mammogram:  12/07/23 Breast Density Cat C, BIRADS Cat 2 benign  Colonoscopy:  years ago Bone Density:  years ago  Result  normal    Immunization History  Administered Date(s) Administered   Influenza,inj,Quad PF,6+ Mos 06/16/2019   Influenza-Unspecified 07/01/2020   PFIZER(Purple Top)SARS-COV-2 Vaccination 10/17/2019, 11/12/2019, 07/07/2020   Pfizer(Comirnaty)Fall Seasonal Vaccine 12 years and older 08/22/2023   Pneumococcal  Conjugate-13 05/05/2014   Pneumococcal Polysaccharide-23 09/16/2011   Td 09/27/2012   Tdap 08/03/2015   Zoster Recombinant(Shingrix) 09/18/2017, 02/19/2018   Zoster, Live 01/21/2014, 09/18/2017, 02/19/2018      reports that she quit smoking about 75 years ago. Her smoking use included cigarettes. She started smoking about 77 years ago. She has a 0.5 pack-year smoking history. She has never used smokeless tobacco. She reports that she does not drink alcohol and does not use drugs.  Past Medical History:  Diagnosis Date   Afib (HCC)    Arthritis of ankle    CAD (coronary artery disease)    Cancer (HCC) 2012   breast cancer-right   Compression fracture of T12 vertebra (HCC)    Congestive heart disease (HCC)    Diverticulosis    Edema    Elevated uric acid in blood    GERD (gastroesophageal reflux disease)    GI bleed 12/2015   Hyperlipidemia    Hypertension    Long-term (current) use of anticoagulants    Moderate mitral regurgitation    Personal history of radiation therapy    Pulmonary HTN (HCC)    Echo 1/19: EF 60-65, no RWMA, Ao sclerosis,  trivial AI, mild MR, mod BAE, mod TR, PASP 49   Stroke (HCC) 01/25/2016   TIA (transient ischemic attack) 09/2011   Urinary incontinence    Urinary retention     Past Surgical History:  Procedure Laterality Date   APPENDECTOMY     AXILLARY SENTINEL NODE BIOPSY Left 01/22/2020   Procedure: Left Axillary Sentinel Node Biopsy;  Surgeon: Ebbie Cough, MD;  Location: [facility] OR;  Service: General;  Laterality: Left;   BREAST LUMPECTOMY WITH RADIOACTIVE SEED AND SENTINEL LYMPH NODE BIOPSY Left 01/22/2020   Procedure: LEFT BREAST LUMPECTOMY WITH RADIOACTIVE SEED;  Surgeon: Ebbie Cough, MD;  Location: [facility] OR;  Service: General;  Laterality: Left;   BREAST SURGERY  2012   Rt.mastectomy--[place], [place]   CARDIAC CATHETERIZATION  2013   CATARACT EXTRACTION, BILATERAL     CERVICAL CONIZATION W/BX N/A 08/08/2020   Procedure: VAGINAL SIDEWALL  BIOPSY;  Surgeon: Cleotilde Ronal RAMAN, MD;  Location: [facility] OR;  Service: Gynecology;  Laterality: N/A;   DILATION AND CURETTAGE OF UTERUS     in her early 44's for DUB   ESOPHAGOGASTRODUODENOSCOPY Left 01/08/2016   Procedure: ESOPHAGOGASTRODUODENOSCOPY (EGD);  Surgeon: Elsie Cree, MD;  Location: [facility];  Service: Endoscopy;  Laterality: Left;   GALLBLADDER SURGERY     MASTECTOMY Right 2012   RADIOLOGY WITH ANESTHESIA N/A 01/25/2016   Procedure: RADIOLOGY WITH ANESTHESIA;  Surgeon: Thyra Nash, MD;  Location: [facility] OR;  Service: Radiology;  Laterality: N/A;   REPLACEMENT TOTAL KNEE BILATERAL     TONSILLECTOMY AND ADENOIDECTOMY     as a child    Current Outpatient Medications  Medication Sig Dispense Refill   acetaminophen  (TYLENOL ) 500 MG tablet Take 1,000 mg by mouth every 12 (twelve) hours as needed for mild pain.     albuterol  (VENTOLIN  HFA) 108 (90 Base) MCG/ACT inhaler Inhale 1 puff into the lungs every 6 (six) hours as needed for wheezing or shortness of breath (Cough). 18 g 2   allopurinol  (ZYLOPRIM ) 100 MG tablet Take 1 tablet (100 mg total) by mouth daily. 30 tablet 1   ascorbic acid  (VITAMIN C ) 500 MG tablet Take 500 mg by mouth 2 (two) times daily.     atorvastatin  (LIPITOR) 10 MG tablet Take 1 tablet (10 mg total) by mouth daily. 90 tablet 3   B Complex Vitamins (VITAMIN B COMPLEX ) CAPS Take 1 capsule by mouth daily.     calcium  carbonate (OSCAL) 1500 (600 Ca) MG TABS tablet Take 1,500 mg by mouth 2 (two) times daily with a meal.     cefTRIAXone  Sodium (ROCEPHIN  IJ) Inject as directed.     cyanocobalamin  (VITAMIN B12) 1000 MCG tablet Take 1,000 mcg by mouth daily.     docusate sodium  (COLACE) 100 MG capsule Take 100 mg by mouth daily as needed.     ELIQUIS  2.5 MG TABS tablet TAKE 1 TABLET TWICE A DAY 180 tablet 3   Ferrous Sulfate  (IRON ) 325 (65 Fe) MG TABS Take 325 mg by mouth daily. (OTC)     furosemide  (LASIX ) 40 MG tablet Take 1 tablet (40 mg total) by mouth daily. 90  tablet 2   metoprolol  tartrate (LOPRESSOR ) 100 MG tablet Take 1 tablet (100 mg total) by mouth 2 (two) times daily. 180 tablet 3   Multiple Vitamins-Minerals (MULTIVITAMIN WITH MINERALS) tablet Take 1 tablet by mouth daily.     omeprazole (PRILOSEC) 10 MG capsule Take 10 mg by mouth every other day.     OXYGEN  Inhale 2 L into the  lungs.     polyethylene glycol (MIRALAX  / GLYCOLAX ) 17 g packet Take 17 g by mouth at bedtime.     potassium chloride  SA (KLOR-CON  M) 20 MEQ tablet Take 1 tablet (20 mEq total) by mouth daily. 30 tablet 0   Probiotic Product (UP4 PROBIOTICS WOMENS PO) Take 1 capsule by mouth daily with breakfast.     pyridOXINE (B-6) 50 MG tablet 1 tablet Orally     cephALEXin  (KEFLEX ) 500 MG capsule Take 1 capsule (500 mg total) by mouth 3 (three) times daily for 14 days. (Patient not taking: Reported on 04/10/2024) 42 capsule 0   meloxicam (MOBIC) 15 MG tablet Take 15 mg by mouth daily. (Patient not taking: Reported on 04/10/2024)     No current facility-administered medications for this visit.    ALLERGIES: Valium [diazepam]; Amiodarone hcl [amiodarone]; Compazine [prochlorperazine]; Other; Thorazine [chlorpromazine]; Zometa [zoledronic acid]; Ciprofloxacin ; Doxycycline ; Zoster vac recomb adjuvanted; Zoster vaccine recombinant, adjuvanted; and Iodinated contrast media  Family History  Problem Relation Age of Onset   Asthma Mother    CVA Father    Atrial fibrillation Sister        HAS PACER   Pneumonia Brother    Cancer Daughter        COLON CANCER   Cancer Maternal Grandmother        breast cancer    Review of Systems  See HPI.   PHYSICAL EXAM:  BP 114/70 (BP Location: Left Arm, Patient Position: Sitting)   Pulse 93   Ht 4' 11 (1.499 m)   Wt 109 lb (49.4 kg)   LMP 09/12/1973 (Approximate)   SpO2 97%   BMI 22.02 kg/m     General appearance: alert, cooperative and appears stated age Breasts: right breast absent, no axillary adenopathy.  Left breast with normal  appearance, no masses or tenderness, No nipple retraction or dimpling, No nipple discharge or bleeding, No axillary adenopathy Abdomen: soft, non-tender; no masses, no organomegaly   Pelvic: External genitalia:  no lesions              No abnormal inguinal nodes palpated.              Urethra:  urethral prolapse.  Foley catheter in place.                              Bartholins and Skenes: normal                 Vagina: uterine procidentia.  1.5 cm ulceration of the left vaginal mucosa and small area of granulation tissue of the left apical vagina, treated with AgNO3 after patient's verbal permission.               Cervix: no lesions              Pap taken: no Bimanual Exam:  Uterus:  normal size, contour, position, consistency, mobility, non-tender              Adnexa: no mass, fullness, tenderness              Rectal exam: yes.  Confirms.              Anus:  normal sphincter tone, no lesions  Chaperone was present for exam:  Kari HERO, CMA  ASSESSMENT:  Encounter for breast and pelvic exam.  Personal history of other medical treatment.  Personal history of bilateral breast cancer.  Status post  right mastectomy.  Uterine procidentia.  Vaginal ulceration tissue.  Vaginal granulation tissue.  Urethral prolapse.  Foley catheter use.  Current UTI treating with Rocephin . Hx stroke.  On Eliquis .  CHF.   PLAN: Mammogram screening discussed. Self breast awareness reviewed. Pap and HRV collected:  no.  She can stop doing pap smear.  We discussed moisturizing products to place on the prolapse:  Replens, Astroglide, and coconut oil. Follow up in 4 - 5 weeks for recheck.   Additional counseling given.  yes. 30 min  total time was spent for this patient encounter, including preparation, face-to-face counseling with the patient, coordination of care, and documentation of the encounter in addition to doing the breast and pelvic exam.

## 2024-04-11 DIAGNOSIS — N390 Urinary tract infection, site not specified: Secondary | ICD-10-CM | POA: Diagnosis not present

## 2024-04-16 ENCOUNTER — Encounter: Admitting: Obstetrics and Gynecology

## 2024-04-30 DIAGNOSIS — R338 Other retention of urine: Secondary | ICD-10-CM | POA: Diagnosis not present

## 2024-05-02 ENCOUNTER — Emergency Department (HOSPITAL_COMMUNITY)
Admission: EM | Admit: 2024-05-02 | Discharge: 2024-05-02 | Disposition: A | Discharge status: Home / Self Care | Attending: Emergency Medicine | Admitting: Emergency Medicine

## 2024-05-02 ENCOUNTER — Emergency Department (HOSPITAL_COMMUNITY)

## 2024-05-02 ENCOUNTER — Other Ambulatory Visit: Payer: Self-pay

## 2024-05-02 ENCOUNTER — Other Ambulatory Visit: Payer: Self-pay | Admitting: Cardiology

## 2024-05-02 DIAGNOSIS — Z853 Personal history of malignant neoplasm of breast: Secondary | ICD-10-CM | POA: Insufficient documentation

## 2024-05-02 DIAGNOSIS — W19XXXA Unspecified fall, initial encounter: Secondary | ICD-10-CM

## 2024-05-02 DIAGNOSIS — S199XXA Unspecified injury of neck, initial encounter: Secondary | ICD-10-CM | POA: Diagnosis not present

## 2024-05-02 DIAGNOSIS — I10 Essential (primary) hypertension: Secondary | ICD-10-CM | POA: Diagnosis not present

## 2024-05-02 DIAGNOSIS — W1830XA Fall on same level, unspecified, initial encounter: Secondary | ICD-10-CM | POA: Insufficient documentation

## 2024-05-02 DIAGNOSIS — I6782 Cerebral ischemia: Secondary | ICD-10-CM | POA: Diagnosis not present

## 2024-05-02 DIAGNOSIS — I2510 Atherosclerotic heart disease of native coronary artery without angina pectoris: Secondary | ICD-10-CM | POA: Insufficient documentation

## 2024-05-02 DIAGNOSIS — R7989 Other specified abnormal findings of blood chemistry: Secondary | ICD-10-CM | POA: Diagnosis not present

## 2024-05-02 DIAGNOSIS — R9089 Other abnormal findings on diagnostic imaging of central nervous system: Secondary | ICD-10-CM | POA: Diagnosis not present

## 2024-05-02 DIAGNOSIS — M25512 Pain in left shoulder: Secondary | ICD-10-CM | POA: Diagnosis not present

## 2024-05-02 DIAGNOSIS — I4891 Unspecified atrial fibrillation: Secondary | ICD-10-CM | POA: Diagnosis not present

## 2024-05-02 DIAGNOSIS — M75102 Unspecified rotator cuff tear or rupture of left shoulder, not specified as traumatic: Secondary | ICD-10-CM | POA: Diagnosis not present

## 2024-05-02 DIAGNOSIS — M19012 Primary osteoarthritis, left shoulder: Secondary | ICD-10-CM | POA: Diagnosis not present

## 2024-05-02 DIAGNOSIS — R531 Weakness: Secondary | ICD-10-CM | POA: Diagnosis not present

## 2024-05-02 DIAGNOSIS — Z8673 Personal history of transient ischemic attack (TIA), and cerebral infarction without residual deficits: Secondary | ICD-10-CM | POA: Insufficient documentation

## 2024-05-02 DIAGNOSIS — S0990XA Unspecified injury of head, initial encounter: Secondary | ICD-10-CM | POA: Diagnosis not present

## 2024-05-02 DIAGNOSIS — D649 Anemia, unspecified: Secondary | ICD-10-CM | POA: Diagnosis not present

## 2024-05-02 LAB — BASIC METABOLIC PANEL WITH GFR
Anion gap: 9 (ref 5–15)
BUN: 16 mg/dL (ref 8–23)
CO2: 28 mmol/L (ref 22–32)
Calcium: 8.9 mg/dL (ref 8.9–10.3)
Chloride: 101 mmol/L (ref 98–111)
Creatinine, Ser: 1.23 mg/dL — ABNORMAL HIGH (ref 0.44–1.00)
GFR, Estimated: 41 mL/min — ABNORMAL LOW (ref >60)
Glucose, Bld: 95 mg/dL (ref 70–99)
Potassium: 4.3 mmol/L (ref 3.5–5.1)
Sodium: 138 mmol/L (ref 135–145)

## 2024-05-02 LAB — CBC WITH DIFFERENTIAL/PLATELET
Abs Immature Granulocytes: 0.03 K/uL (ref 0.00–0.07)
Basophils Absolute: 0.1 K/uL (ref 0.0–0.1)
Basophils Relative: 1 %
Eosinophils Absolute: 0.2 K/uL (ref 0.0–0.5)
Eosinophils Relative: 2 %
HCT: 34 % — ABNORMAL LOW (ref 36.0–46.0)
Hemoglobin: 10.9 g/dL — ABNORMAL LOW (ref 12.0–15.0)
Immature Granulocytes: 0 %
Lymphocytes Relative: 19 %
Lymphs Abs: 1.7 K/uL (ref 0.7–4.0)
MCH: 30.6 pg (ref 26.0–34.0)
MCHC: 32.1 g/dL (ref 30.0–36.0)
MCV: 95.5 fL (ref 80.0–100.0)
Monocytes Absolute: 0.8 K/uL (ref 0.1–1.0)
Monocytes Relative: 9 %
Neutro Abs: 6 K/uL (ref 1.7–7.7)
Neutrophils Relative %: 69 %
Platelets: 277 K/uL (ref 150–400)
RBC: 3.56 MIL/uL — ABNORMAL LOW (ref 3.87–5.11)
RDW: 14.7 % (ref 11.5–15.5)
WBC: 8.7 K/uL (ref 4.0–10.5)
nRBC: 0 % (ref 0.0–0.2)

## 2024-05-02 MED ORDER — ACETAMINOPHEN 500 MG PO TABS
1000.0000 mg | ORAL_TABLET | Freq: Once | ORAL | Status: AC
  Administered 2024-05-02: 1000 mg via ORAL
  Filled 2024-05-02: qty 2

## 2024-05-02 NOTE — ED Notes (Signed)
 Spoke to [employer], Engineer, civil (consulting) at [employer]. Daughter to take pt back to [place].

## 2024-05-02 NOTE — ED Provider Notes (Signed)
 Emergency Department Provider Note   I have reviewed the triage vital signs and the nursing notes.   HISTORY  Chief Complaint No chief complaint on file.   HPI Tunisha Ruland is a 89 y.o. female with past history reviewed below, on anticoagulation, presents to the emergency department with pain after a fall.  She had a ground-level fall where she {**SYMPTOM/COMPLAINT  LOCATION (describe anatomically) DURATION (when did it start) TIMING (onset and pattern) SEVERITY (0-10, mild/moderate/severe) QUALITY (description of symptoms) CONTEXT (recent surgery, new meds, activity, etc.) MODIFYINGFACTORS (what makes it better/worse) ASSOCIATEDSYMPTOMS (pertinent positives and negatives)**}  Past Medical History:  Diagnosis Date   Afib (HCC)    Arthritis of ankle    CAD (coronary artery disease)    Cancer (HCC) 2012   breast cancer-right   Compression fracture of T12 vertebra (HCC)    Congestive heart disease (HCC)    Diverticulosis    Edema    Elevated uric acid in blood    GERD (gastroesophageal reflux disease)    GI bleed 12/2015   Hyperlipidemia    Hypertension    Shylyn Younce-term (current) use of anticoagulants    Moderate mitral regurgitation    Personal history of radiation therapy    Pulmonary HTN (HCC)    Echo 1/19: EF 60-65, no RWMA, Ao sclerosis, trivial AI, mild MR, mod BAE, mod TR, PASP 49   Stroke (HCC) 01/25/2016   TIA (transient ischemic attack) 09/2011   Urinary incontinence    Urinary retention     Review of Systems {** Revise as appropriate then delete this line - Documentation of 10 systems OR 2 systems and 10-point ROS otherwise negative is required **}Constitutional: No fever/chills Eyes: No visual changes. ENT: No sore throat. Cardiovascular: Denies chest pain. Respiratory: Denies shortness of breath. Gastrointestinal: No abdominal pain.  No nausea, no vomiting.  No diarrhea.  No constipation. Genitourinary: Negative for  dysuria. Musculoskeletal: Negative for back pain. Skin: Negative for rash. Neurological: Negative for headaches, focal weakness or numbness. {**Psychiatric:  Endocrine:  Hematological/Lymphatic:  Allergic/Immunilogical: **}  ____________________________________________   PHYSICAL EXAM:  VITAL SIGNS: ED Triage Vitals  Encounter Vitals Group     BP --      Girls Systolic BP Percentile --      Girls Diastolic BP Percentile --      Boys Systolic BP Percentile --      Boys Diastolic BP Percentile --      Pulse --      Resp --      Temp --      Temp src --      SpO2 05/02/24 1124 97 %     Weight --      Height --      Head Circumference --      Peak Flow --      Pain Score 05/02/24 1127 0     Pain Loc --      Pain Education --      Exclude from Growth Chart --    {** Revise as appropriate then delete this line - 8 systems required **} Constitutional: Alert and oriented. Well appearing and in no acute distress. Eyes: Conjunctivae are normal. PERRL. EOMI. Head: Atraumatic. {**Ears:  Healthy appearing ear canals and TMs bilaterally **}Nose: No congestion/rhinnorhea. Mouth/Throat: Mucous membranes are moist.  Oropharynx non-erythematous. Neck: No stridor.  No meningeal signs.  {**No cervical spine tenderness to palpation.**} Cardiovascular: Normal rate, regular rhythm. Good peripheral circulation. Grossly normal heart sounds.  Respiratory: Normal respiratory effort.  No retractions. Lungs CTAB. Gastrointestinal: Soft and nontender. No distention.  {**Genitourinary:  **}Musculoskeletal: No lower extremity tenderness nor edema. No gross deformities of extremities. Neurologic:  Normal speech and language. No gross focal neurologic deficits are appreciated.  Skin:  Skin is warm, dry and intact. No rash noted. {**Psychiatric: Mood and affect are normal. Speech and behavior are normal.**}  ____________________________________________   LABS (all labs ordered are listed, but  only abnormal results are displayed)  Labs Reviewed  BASIC METABOLIC PANEL WITH GFR  CBC WITH DIFFERENTIAL/PLATELET   ____________________________________________  EKG  *** ____________________________________________  RADIOLOGY  No results found.  ____________________________________________   PROCEDURES  Procedure(s) performed:   Procedures   ____________________________________________   INITIAL IMPRESSION / ASSESSMENT AND PLAN / ED COURSE  Pertinent labs & imaging results that were available during my care of the patient were reviewed by me and considered in my medical decision making (see chart for details).   This patient is Presenting for Evaluation of ***, which {Range:23949} require a range of treatment options, and {MDMcomplaint:23950} a complaint that involves a {MDMlevelrisk:23951} risk of morbidity and mortality.  The Differential Diagnoses include***.  Critical Interventions-    Medications  acetaminophen  (TYLENOL ) tablet 1,000 mg (has no administration in time range)    Reassessment after intervention:     I *** Additional Historical Information from ***, as the patient is ***.  I decided to review pertinent External Data, and in summary ***.   Clinical Laboratory Tests Ordered, included   Radiologic Tests Ordered, included ***. I independently interpreted the images and agree with radiology interpretation.   Cardiac Monitor Tracing which shows ***   Social Determinants of Health Risk ***  Consult complete with  Medical Decision Making: Summary: ***  Reevaluation with update and discussion with   ***Considered admission***  Patient's presentation is most consistent with {EM COPA:27473}   Disposition:   ____________________________________________  FINAL CLINICAL IMPRESSION(S) / ED DIAGNOSES  Final diagnoses:  None     NEW OUTPATIENT MEDICATIONS STARTED DURING THIS VISIT:  New Prescriptions   No medications on file     Note:  This document was prepared using Dragon voice recognition software and may include unintentional dictation errors.  Fonda Law, MD, [facility] Emergency Medicine

## 2024-05-02 NOTE — Discharge Instructions (Signed)
 You were seen in the emerged part today after a fall.  Your CT scans and x-rays did not show any broken bones or internal bleeding.  Please continue your home medications. Return with any new or suddenly worsening symptoms.

## 2024-05-02 NOTE — ED Notes (Signed)
 Pt to CT with TRN Alan

## 2024-05-02 NOTE — ED Notes (Signed)
 Trauma Response Nurse Documentation  Adrienne [name] is a 89 y.o. female arriving to [facility] ED via EMS  On Eliquis  (apixaban ) daily. Trauma was activated as a Level 2 based on the following trauma criteria Elderly patients > 65 with head trauma on anti-coagulation (excluding ASA).  Patient cleared for CT by Dr. Darra. Pt transported to CT with trauma response nurse present to monitor. RN remained with the patient throughout their absence from the department for clinical observation.   GCS 15.  History   Past Medical History:  Diagnosis Date   Afib (HCC)    Arthritis of ankle    CAD (coronary artery disease)    Cancer (HCC) 2012   breast cancer-right   Compression fracture of T12 vertebra (HCC)    Congestive heart disease (HCC)    Diverticulosis    Edema    Elevated uric acid in blood    GERD (gastroesophageal reflux disease)    GI bleed 12/2015   Hyperlipidemia    Hypertension    Long-term (current) use of anticoagulants    Moderate mitral regurgitation    Personal history of radiation therapy    Pulmonary HTN (HCC)    Echo 1/19: EF 60-65, no RWMA, Ao sclerosis, trivial AI, mild MR, mod BAE, mod TR, PASP 49   Stroke (HCC) 01/25/2016   TIA (transient ischemic attack) 09/2011   Urinary incontinence    Urinary retention      Past Surgical History:  Procedure Laterality Date   APPENDECTOMY     AXILLARY SENTINEL NODE BIOPSY Left 01/22/2020   Procedure: Left Axillary Sentinel Node Biopsy;  Surgeon: Ebbie Cough, MD;  Location: [facility] OR;  Service: General;  Laterality: Left;   BREAST LUMPECTOMY WITH RADIOACTIVE SEED AND SENTINEL LYMPH NODE BIOPSY Left 01/22/2020   Procedure: LEFT BREAST LUMPECTOMY WITH RADIOACTIVE SEED;  Surgeon: Ebbie Cough, MD;  Location: [facility] OR;  Service: General;  Laterality: Left;   BREAST SURGERY  2012   Rt.mastectomy--[place], [place]   CARDIAC CATHETERIZATION  2013   CATARACT EXTRACTION, BILATERAL     CERVICAL CONIZATION W/BX N/A  08/08/2020   Procedure: VAGINAL SIDEWALL BIOPSY;  Surgeon: Cleotilde Ronal RAMAN, MD;  Location: [facility] OR;  Service: Gynecology;  Laterality: N/A;   DILATION AND CURETTAGE OF UTERUS     in her early 9's for DUB   ESOPHAGOGASTRODUODENOSCOPY Left 01/08/2016   Procedure: ESOPHAGOGASTRODUODENOSCOPY (EGD);  Surgeon: Elsie Cree, MD;  Location: [facility];  Service: Endoscopy;  Laterality: Left;   GALLBLADDER SURGERY     MASTECTOMY Right 2012   RADIOLOGY WITH ANESTHESIA N/A 01/25/2016   Procedure: RADIOLOGY WITH ANESTHESIA;  Surgeon: Thyra Nash, MD;  Location: [facility] OR;  Service: Radiology;  Laterality: N/A;   REPLACEMENT TOTAL KNEE BILATERAL     TONSILLECTOMY AND ADENOIDECTOMY     as a child     Initial Focused Assessment (If applicable, or please see trauma documentation): Patient A&Ox4, GCS 15, PERR 3 Airway intact, bilateral breath sounds Pulses 2+  CT's Completed:   CT Head and CT C-Spine   Interventions:  IV, labs XR L clavicle CT Head/Cspine Tylenol   Plan for disposition:  Discharge home   Event Summary: Patient to ED after a mechanical fall where she was walking to the bathroom and fell. Patient was walking with husbands urinal and her foley catheter, got tripped up in her walker and fell hitting her L clavicle on the walker and her head on a rug over a tile floor. Patient A&Ox4, no LOC. Imaging  was ordered and revealed no traumatic injury. Patient able to discharge home.  Bedside handoff with ED RN Milo.    [name] Adrienne [name]  Trauma Response RN  Please call TRN at [phone] for further assistance.

## 2024-05-02 NOTE — Progress Notes (Signed)
 Orthopedic Tech Progress Note Patient Details:  Adrienne [name] [birth year] [id number] Level 2 Trauma. Not needed Patient ID: Adrienne [name], female   DOB: 1928/06/17, 89 y.o.   MRN: [id number]  [name] 05/02/2024, 11:54 AM

## 2024-05-02 NOTE — ED Triage Notes (Signed)
 Pt bib ems for FOT, pt lost her balance and ha mechanical fall hit left clavicle on walker on the way down. Landed on left shoulder and hit head on floor. Takes eliquis . A&Ox4  20G Lac

## 2024-05-10 ENCOUNTER — Telehealth: Payer: Self-pay

## 2024-05-10 DIAGNOSIS — I5032 Chronic diastolic (congestive) heart failure: Secondary | ICD-10-CM

## 2024-05-14 ENCOUNTER — Other Ambulatory Visit: Payer: Self-pay

## 2024-05-14 ENCOUNTER — Encounter (HOSPITAL_BASED_OUTPATIENT_CLINIC_OR_DEPARTMENT_OTHER): Payer: Self-pay

## 2024-05-14 ENCOUNTER — Emergency Department (HOSPITAL_BASED_OUTPATIENT_CLINIC_OR_DEPARTMENT_OTHER)
Admission: EM | Admit: 2024-05-14 | Discharge: 2024-05-14 | Disposition: A | Discharge status: Home / Self Care | Attending: Emergency Medicine | Admitting: Emergency Medicine

## 2024-05-14 DIAGNOSIS — Z466 Encounter for fitting and adjustment of urinary device: Secondary | ICD-10-CM

## 2024-05-14 DIAGNOSIS — I10 Essential (primary) hypertension: Secondary | ICD-10-CM | POA: Diagnosis not present

## 2024-05-14 DIAGNOSIS — Y732 Prosthetic and other implants, materials and accessory gastroenterology and urology devices associated with adverse incidents: Secondary | ICD-10-CM | POA: Diagnosis not present

## 2024-05-14 DIAGNOSIS — R339 Retention of urine, unspecified: Secondary | ICD-10-CM | POA: Diagnosis not present

## 2024-05-14 DIAGNOSIS — Z7901 Long term (current) use of anticoagulants: Secondary | ICD-10-CM | POA: Insufficient documentation

## 2024-05-14 DIAGNOSIS — T83038A Leakage of other indwelling urethral catheter, initial encounter: Secondary | ICD-10-CM | POA: Insufficient documentation

## 2024-05-14 DIAGNOSIS — Z79899 Other long term (current) drug therapy: Secondary | ICD-10-CM | POA: Diagnosis not present

## 2024-05-14 DIAGNOSIS — I2510 Atherosclerotic heart disease of native coronary artery without angina pectoris: Secondary | ICD-10-CM | POA: Diagnosis not present

## 2024-05-14 DIAGNOSIS — T83091A Other mechanical complication of indwelling urethral catheter, initial encounter: Secondary | ICD-10-CM | POA: Diagnosis not present

## 2024-05-14 NOTE — ED Triage Notes (Signed)
 Arrives POV with complaints of draining around her chronic urinary catheter and pain around catheter site that started today.

## 2024-05-14 NOTE — ED Provider Notes (Signed)
 [place] EMERGENCY DEPARTMENT AT [facility] Provider Note   CSN: [id number] Arrival date & time: 05/14/24  1717     Patient presents with: Foley Problem   Adrienne [name] is a 89 y.o. female.   Patient is a 89 year old female with a history of atrial fibrillation on chronic anticoagulation, moderate mitral regurgitation, CAD, hypertension, urinary incontinence and retention with bladder prolapse with chronic Foley catheter who is presenting today with complaints of suprapubic discomfort and leaking of urine around the catheter.  This started today.  She reports the catheter was last changed about 2 weeks ago.  She was feeling fine till today when the symptoms started this afternoon.  She has not had fever that she is aware of.  No nausea or vomiting.  The history is provided by the patient and medical records.       Prior to Admission medications   Medication Sig Start Date End Date Taking? Authorizing Provider  acetaminophen  (TYLENOL ) 500 MG tablet Take 1,000 mg by mouth every 12 (twelve) hours as needed for mild pain.    [provider]  albuterol  (VENTOLIN  HFA) 108 (90 Base) MCG/ACT inhaler Inhale 1 puff into the lungs every 6 (six) hours as needed for wheezing or shortness of breath (Cough). 09/08/22   Regalado, Belkys A, MD  allopurinol  (ZYLOPRIM ) 100 MG tablet Take 1 tablet (100 mg total) by mouth daily. 09/08/22   Regalado, Belkys A, MD  ascorbic acid  (VITAMIN C ) 500 MG tablet Take 500 mg by mouth 2 (two) times daily.    [provider]  atorvastatin  (LIPITOR) 10 MG tablet Take 1 tablet (10 mg total) by mouth daily. 08/14/23   Jeffrie Oneil BROCKS, MD  B Complex Vitamins (VITAMIN B COMPLEX ) CAPS Take 1 capsule by mouth daily.    [provider]  calcium  carbonate (OSCAL) 1500 (600 Ca) MG TABS tablet Take 1,500 mg by mouth 2 (two) times daily with a meal.    [provider]  cefTRIAXone  Sodium (ROCEPHIN  IJ) Inject as directed.     [provider]  cyanocobalamin  (VITAMIN B12) 1000 MCG tablet Take 1,000 mcg by mouth daily.    [provider]  docusate sodium  (COLACE) 100 MG capsule Take 100 mg by mouth daily as needed. 06/10/23   [provider]  ELIQUIS  2.5 MG TABS tablet TAKE 1 TABLET TWICE A DAY 03/08/24   Jeffrie Oneil BROCKS, MD  Ferrous Sulfate  (IRON ) 325 (65 Fe) MG TABS Take 325 mg by mouth daily. (OTC)    [provider]  furosemide  (LASIX ) 40 MG tablet Take 1 tablet (40 mg total) by mouth daily. 11/16/23 04/10/24  Jeffrie Oneil BROCKS, MD  meloxicam (MOBIC) 15 MG tablet Take 15 mg by mouth daily. Patient not taking: Reported on 04/10/2024 09/14/23   [provider]  metoprolol  tartrate (LOPRESSOR ) 100 MG tablet Take 1 tablet (100 mg total) by mouth 2 (two) times daily. 08/14/23   Jeffrie Oneil BROCKS, MD  Multiple Vitamins-Minerals (MULTIVITAMIN WITH MINERALS) tablet Take 1 tablet by mouth daily.    [provider]  omeprazole (PRILOSEC) 10 MG capsule Take 10 mg by mouth every other day. 03/21/19   [provider]  OXYGEN  Inhale 2 L into the lungs.    [provider]  polyethylene glycol (MIRALAX  / GLYCOLAX ) 17 g packet Take 17 g by mouth at bedtime.    [provider]  potassium chloride  SA (KLOR-CON  M) 20 MEQ tablet Take 1 tablet (20 mEq total) by mouth  daily. 06/17/23   Arrien, Mauricio Daniel, MD  Probiotic Product (UP4 PROBIOTICS WOMENS PO) Take 1 capsule by mouth daily with breakfast.    [provider]  pyridOXINE (B-6) 50 MG tablet 1 tablet Orally    [provider]    Allergies: Valium [diazepam]; Amiodarone hcl [amiodarone]; Compazine [prochlorperazine]; Other; Thorazine [chlorpromazine]; Zometa [zoledronic acid]; Ciprofloxacin ; Doxycycline ; Zoster vac recomb adjuvanted; Zoster vaccine recombinant, adjuvanted; and Iodinated contrast media    Review of Systems  Updated Vital Signs BP (!) 146/86   Pulse 77   Temp 98 F (36.7 C)    Resp 18   Ht 5' 1 (1.549 m)   Wt 49.9 kg   LMP 09/12/1973 (Approximate)   SpO2 94%   BMI 20.78 kg/m   Physical Exam Vitals and nursing note reviewed.  Constitutional:      General: She is not in acute distress.    Appearance: She is well-developed.  HENT:     Head: Normocephalic and atraumatic.  Eyes:     Pupils: Pupils are equal, round, and reactive to light.  Cardiovascular:     Rate and Rhythm: Normal rate and regular rhythm.     Pulses: Normal pulses.     Heart sounds: Normal heart sounds. No murmur heard.    No friction rub.  Pulmonary:     Effort: Pulmonary effort is normal.     Breath sounds: Normal breath sounds. No wheezing or rales.  Abdominal:     General: Bowel sounds are normal. There is no distension.     Palpations: Abdomen is soft.     Tenderness: There is abdominal tenderness. There is no guarding or rebound.     Comments: Mild suprapubic tenderness  Genitourinary:    Comments: Leakage of urine around the catheter and bladder prolapse noted Musculoskeletal:        General: No tenderness. Normal range of motion.     Comments: No edema  Skin:    General: Skin is warm and dry.     Findings: No rash.  Neurological:     Mental Status: She is alert and oriented to person, place, and time.     Cranial Nerves: No cranial nerve deficit.  Psychiatric:        Behavior: Behavior normal.     (all labs ordered are listed, but only abnormal results are displayed) Labs Reviewed  URINE CULTURE    EKG: None  Radiology: No results found.   Procedures   Medications Ordered in the ED - No data to display                                  Medical Decision Making Amount and/or Complexity of Data Reviewed External Data Reviewed: notes. Labs: ordered.   Patient here today due to lower abdominal discomfort, leakage of urine around her catheter in the setting of a chronic prolapse.  Patient denying infectious symptoms prior to the pain starting and suspect  a component of urinary retention.  Will place a new Foley catheter.  After new catheter was placed patient is feeling much better.  She reports she recently had a UTI and had to get IM Rocephin  for it.  She reports the pain is gone and clear yellow urine is draining into the bag.  A urine culture was sent but at this time we will hold off getting further antibiotics.  Discussed this with the patient and they will  follow-up with culture results.  At this time patient is stable for discharge     Final diagnoses:  Urinary catheter (Foley) change required  Urinary retention    ED Discharge Orders     None          Doretha Folks, MD 05/14/24 2023

## 2024-05-14 NOTE — Discharge Instructions (Signed)
 Urine cultures were sent and if there is anything abnormal someone should call you to make sure you get the appropriate antibiotic.  Otherwise it should show up on your MyChart account or your doctor can look it up for you.  If you start having severe abdominal pain, fever, vomiting or other concerns return to the emergency room or follow-up with your doctor

## 2024-05-17 LAB — URINE CULTURE: Culture: 40000 — AB

## 2024-05-18 ENCOUNTER — Telehealth (HOSPITAL_BASED_OUTPATIENT_CLINIC_OR_DEPARTMENT_OTHER): Payer: Self-pay | Admitting: *Deleted

## 2024-05-18 NOTE — Telephone Encounter (Signed)
 Post ED Visit - Positive Culture Follow-up  Culture report reviewed by antimicrobial stewardship pharmacist: [facility] []  Rankin Dee, Pharm.D. []  Venetia Gully, Pharm.D., BCPS AQ-ID []  Garrel Crews, Pharm.D., BCPS []  Almarie Lunger, Pharm.D., BCPS []  [place], [address].D., BCPS, AAHIVP []  Rosaline Bihari, Pharm.D., BCPS, AAHIVP []  Vernell Meier, PharmD, BCPS []  Latanya Hint, PharmD, BCPS []  Donald Medley, PharmD, BCPS []  Rocky Bold, PharmD []  Dorothyann Alert, PharmD, BCPS [x]  Koren Or, PharmD  [facility] []  Rosaline Edison, PharmD []  Romona Bliss, PharmD []  Dolphus Roller, PharmD []  Veva Seip, Rph []  Vernell Daunt) Leonce, PharmD []  Eva Allis, PharmD []  Rosaline Millet, PharmD []  Iantha Batch, PharmD []  Arvin Gauss, PharmD []  Wanda Hasting, PharmD []  Ronal Rav, PharmD []  Rocky Slade, PharmD []  Bard Jeans, PharmD   Positive urine culture No treatment needed per Mliss Boyers, MD  Albino Alan Novak 05/18/2024, 11:39 AM

## 2024-05-23 ENCOUNTER — Telehealth: Payer: Self-pay | Admitting: *Deleted

## 2024-05-23 NOTE — Progress Notes (Signed)
 Complex Care Management Note Care Guide Note  05/23/2024 Name: Ethelyn Cerniglia MRN: [id number] DOB: 31-Dec-1927   Complex Care Management Outreach Attempts: An unsuccessful telephone outreach was attempted today to offer the patient information about available complex care management services.  Follow Up Plan:  Additional outreach attempts will be made to offer the patient complex care management information and services.   Encounter Outcome:  No Answer  Harlene Satterfield  [facility]  [facility], [facility] Guide  Direct Dial: [phone]  Fax [phone]

## 2024-05-24 NOTE — Progress Notes (Signed)
 Complex Care Management Note Care Guide Note  05/24/2024 Name: Adrienne [name] MRN: [id number] DOB: 1928/05/01   Complex Care Management Outreach Attempts: A second unsuccessful outreach was attempted today to offer the patient with information about available complex care management services.  Follow Up Plan:  Additional outreach attempts will be made to offer the patient complex care management information and services.   Encounter Outcome:  No Answer  Harlene Satterfield  [facility]  [facility], [facility] Guide  Direct Dial: [phone]  Fax [phone]

## 2024-05-26 ENCOUNTER — Other Ambulatory Visit: Payer: Self-pay

## 2024-05-26 ENCOUNTER — Emergency Department (HOSPITAL_BASED_OUTPATIENT_CLINIC_OR_DEPARTMENT_OTHER)
Admission: EM | Admit: 2024-05-26 | Discharge: 2024-05-26 | Disposition: A | Discharge status: Home / Self Care | Attending: Emergency Medicine | Admitting: Emergency Medicine

## 2024-05-26 ENCOUNTER — Encounter (HOSPITAL_BASED_OUTPATIENT_CLINIC_OR_DEPARTMENT_OTHER): Payer: Self-pay

## 2024-05-26 ENCOUNTER — Emergency Department (HOSPITAL_BASED_OUTPATIENT_CLINIC_OR_DEPARTMENT_OTHER)

## 2024-05-26 DIAGNOSIS — K5730 Diverticulosis of large intestine without perforation or abscess without bleeding: Secondary | ICD-10-CM | POA: Diagnosis not present

## 2024-05-26 DIAGNOSIS — Z79899 Other long term (current) drug therapy: Secondary | ICD-10-CM | POA: Diagnosis not present

## 2024-05-26 DIAGNOSIS — Z7901 Long term (current) use of anticoagulants: Secondary | ICD-10-CM | POA: Insufficient documentation

## 2024-05-26 DIAGNOSIS — N2889 Other specified disorders of kidney and ureter: Secondary | ICD-10-CM | POA: Diagnosis not present

## 2024-05-26 DIAGNOSIS — N132 Hydronephrosis with renal and ureteral calculous obstruction: Secondary | ICD-10-CM | POA: Diagnosis not present

## 2024-05-26 DIAGNOSIS — N3000 Acute cystitis without hematuria: Secondary | ICD-10-CM | POA: Insufficient documentation

## 2024-05-26 DIAGNOSIS — K429 Umbilical hernia without obstruction or gangrene: Secondary | ICD-10-CM | POA: Diagnosis not present

## 2024-05-26 DIAGNOSIS — R319 Hematuria, unspecified: Secondary | ICD-10-CM | POA: Diagnosis present

## 2024-05-26 LAB — URINALYSIS, ROUTINE W REFLEX MICROSCOPIC
Bilirubin Urine: NEGATIVE
Glucose, UA: NEGATIVE mg/dL
Ketones, ur: NEGATIVE mg/dL
Nitrite: NEGATIVE
Protein, ur: 100 mg/dL — AB
RBC / HPF: >50 RBC/hpf (ref 0–5)
Specific Gravity, Urine: 1.009 (ref 1.005–1.030)
WBC, UA: >50 WBC/hpf (ref 0–5)
pH: 8.5 — ABNORMAL HIGH (ref 5.0–8.0)

## 2024-05-26 MED ORDER — CEPHALEXIN 500 MG PO CAPS: 500.0000 mg | ORAL_CAPSULE | Freq: Three times a day (TID) | ORAL | 0 refills | Status: AC

## 2024-05-26 MED ORDER — CEPHALEXIN 250 MG PO CAPS
500.0000 mg | ORAL_CAPSULE | Freq: Once | ORAL | Status: AC
  Administered 2024-05-26: 500 mg via ORAL
  Filled 2024-05-26: qty 2

## 2024-05-26 NOTE — Discharge Instructions (Signed)
 Please return to the emergency department if you develop fever or worsening symptoms.  Take next dose of antibiotic tomorrow.

## 2024-05-26 NOTE — ED Provider Notes (Signed)
 [place] EMERGENCY DEPARTMENT AT [facility] Provider Note   CSN: [id number] Arrival date & time: 05/26/24  2028     Patient presents with: Hematuria   Adrienne [name] is a 89 y.o. female.   Patient here with blood in her urine and some lower abdominal pain feels like a UTI.  Adrienne [name] has chronic Foley catheter due to urinary retention history.  Her Foley catheter was last changed a few weeks ago.  Adrienne [name] gets changed about every month.  Adrienne [name] denies any fever or chills.  No nausea or vomiting.  No chest pain or shortness of breath.  Adrienne [name] is on Eliquis .  Says that her Foley catheter has been emptying well.  The history is provided by the patient.       Prior to Admission medications   Medication Sig Start Date End Date Taking? Authorizing Provider  cephALEXin  (KEFLEX ) 500 MG capsule Take 1 capsule (500 mg total) by mouth 3 (three) times daily for 5 days. 05/26/24 05/31/24 Yes Kirstyn Lean, DO  acetaminophen  (TYLENOL ) 500 MG tablet Take 1,000 mg by mouth every 12 (twelve) hours as needed for mild pain.    [provider]  albuterol  (VENTOLIN  HFA) 108 (90 Base) MCG/ACT inhaler Inhale 1 puff into the lungs every 6 (six) hours as needed for wheezing or shortness of breath (Cough). 09/08/22   Regalado, Belkys A, MD  allopurinol  (ZYLOPRIM ) 100 MG tablet Take 1 tablet (100 mg total) by mouth daily. 09/08/22   Regalado, Belkys A, MD  ascorbic acid  (VITAMIN C ) 500 MG tablet Take 500 mg by mouth 2 (two) times daily.    [provider]  atorvastatin  (LIPITOR) 10 MG tablet Take 1 tablet (10 mg total) by mouth daily. 08/14/23   Jeffrie Oneil BROCKS, MD  B Complex Vitamins (VITAMIN B COMPLEX ) CAPS Take 1 capsule by mouth daily.    [provider]  calcium  carbonate (OSCAL) 1500 (600 Ca) MG TABS tablet Take 1,500 mg by mouth 2 (two) times daily with a meal.    [provider]  cefTRIAXone  Sodium (ROCEPHIN  IJ) Inject as directed.    [provider]   cyanocobalamin  (VITAMIN B12) 1000 MCG tablet Take 1,000 mcg by mouth daily.    [provider]  docusate sodium  (COLACE) 100 MG capsule Take 100 mg by mouth daily as needed. 06/10/23   [provider]  ELIQUIS  2.5 MG TABS tablet TAKE 1 TABLET TWICE A DAY 03/08/24   Jeffrie Oneil BROCKS, MD  Ferrous Sulfate  (IRON ) 325 (65 Fe) MG TABS Take 325 mg by mouth daily. (OTC)    [provider]  furosemide  (LASIX ) 40 MG tablet Take 1 tablet (40 mg total) by mouth daily. 11/16/23 04/10/24  Jeffrie Oneil BROCKS, MD  meloxicam (MOBIC) 15 MG tablet Take 15 mg by mouth daily. Patient not taking: Reported on 04/10/2024 09/14/23   [provider]  metoprolol  tartrate (LOPRESSOR ) 100 MG tablet Take 1 tablet (100 mg total) by mouth 2 (two) times daily. 08/14/23   Jeffrie Oneil BROCKS, MD  Multiple Vitamins-Minerals (MULTIVITAMIN WITH MINERALS) tablet Take 1 tablet by mouth daily.    [provider]  omeprazole (PRILOSEC) 10 MG capsule Take 10 mg by mouth every other day. 03/21/19   [provider]  OXYGEN  Inhale 2 L into the lungs.    [provider]  polyethylene glycol (MIRALAX  / GLYCOLAX ) 17 g packet Take 17 g by mouth at bedtime.    [provider]  potassium chloride  SA (KLOR-CON   M) 20 MEQ tablet Take 1 tablet (20 mEq total) by mouth daily. 06/17/23   Arrien, Mauricio Daniel, MD  Probiotic Product (UP4 PROBIOTICS WOMENS PO) Take 1 capsule by mouth daily with breakfast.    [provider]  pyridOXINE (B-6) 50 MG tablet 1 tablet Orally    [provider]    Allergies: Valium [diazepam]; Amiodarone hcl [amiodarone]; Compazine [prochlorperazine]; Other; Thorazine [chlorpromazine]; Zometa [zoledronic acid]; Ciprofloxacin ; Doxycycline ; Zoster vac recomb adjuvanted; Zoster vaccine recombinant, adjuvanted; and Iodinated contrast media    Review of Systems  Updated Vital Signs BP (!) 145/86   Pulse 86   Temp 97.7 F (36.5 C) (Oral)   Resp 16   LMP  09/12/1973 (Approximate)   SpO2 99%   Physical Exam Vitals and nursing note reviewed.  Constitutional:      General: Adrienne [name] is not in acute distress.    Appearance: Adrienne [name] is well-developed. Adrienne [name] is not ill-appearing.  HENT:     Head: Normocephalic and atraumatic.     Nose: Nose normal.     Mouth/Throat:     Mouth: Mucous membranes are moist.  Eyes:     Extraocular Movements: Extraocular movements intact.     Conjunctiva/sclera: Conjunctivae normal.     Pupils: Pupils are equal, round, and reactive to light.  Cardiovascular:     Rate and Rhythm: Normal rate and regular rhythm.     Pulses: Normal pulses.     Heart sounds: Normal heart sounds. No murmur heard. Pulmonary:     Effort: Pulmonary effort is normal. No respiratory distress.     Breath sounds: Normal breath sounds.  Abdominal:     General: Abdomen is flat.     Palpations: Abdomen is soft.     Tenderness: There is no abdominal tenderness.  Musculoskeletal:        General: No swelling.     Cervical back: Normal range of motion and neck supple.  Skin:    General: Skin is warm and dry.     Capillary Refill: Capillary refill takes less than 2 seconds.  Neurological:     General: No focal deficit present.     Mental Status: Adrienne [name] is alert.  Psychiatric:        Mood and Affect: Mood normal.     (all labs ordered are listed, but only abnormal results are displayed) Labs Reviewed  URINALYSIS, ROUTINE W REFLEX MICROSCOPIC - Abnormal; Notable for the following components:      Result Value   Color, Urine ORANGE (*)    APPearance HAZY (*)    pH 8.5 (*)    Hgb urine dipstick LARGE (*)    Protein, ur 100 (*)    Leukocytes,Ua LARGE (*)    Bacteria, UA MANY (*)    All other components within normal limits  URINE CULTURE    EKG: None  Radiology: CT Renal Stone Study Result Date: 05/26/2024 CLINICAL DATA:  Abdominal and flank pain EXAM: CT ABDOMEN AND PELVIS WITHOUT CONTRAST TECHNIQUE: Multidetector CT imaging of the  abdomen and pelvis was performed following the standard protocol without IV contrast. RADIATION DOSE REDUCTION: This exam was performed according to the departmental dose-optimization program which includes automated exposure control, adjustment of the mA and/or kV according to patient size and/or use of iterative reconstruction technique. COMPARISON:  CT abdomen and pelvis 06/03/2023. FINDINGS: Lower chest: No acute abnormality.  Mixed healed to [place] Hepatobiliary: No focal liver abnormality is seen. No gallstones, gallbladder wall thickening, or biliary dilatation. Pancreas: Unremarkable. No  pancreatic ductal dilatation or surrounding inflammatory changes. Spleen: Normal in size without focal abnormality. Adrenals/Urinary Tract: Bilateral adrenal glands and right kidney are within normal limits. There are punctate left renal calculi measuring 1 mm. There is moderate severe hydroureteronephrosis to the level of prolapse ureter. No definitive ureteral calculus identified. Appearance is unchanged from prior. Foley catheter decompresses the bladder. Stomach/Bowel: Stomach is within normal limits. No evidence of bowel wall thickening, distention, or inflammatory changes. The appendix is not seen. There are scattered air-fluid levels throughout small bowel and colon and within the stomach. There is sigmoid and descending colon diverticulosis. Vascular/Lymphatic: There are extensive atherosclerotic calcifications throughout aorta, iliac arteries and branch vessels. Aorta and IVC are normal in size. No enlarged lymph nodes are seen. Reproductive: Status post hysterectomy. No adnexal masses. Other: There is marked pelvic floor relaxation as seen on prior. There is no ascites. There is a small fat containing umbilical hernia. Musculoskeletal: Degenerative changes affect the spine. IMPRESSION: 1. Stable moderate to severe left hydroureteronephrosis to the level of the prolapsed ureter. No ureteral calculus identified. 2.  Nonobstructing left renal calculi. 3. Scattered air-fluid levels throughout the stomach, small bowel and colon may represent ileus or gastroenteritis. 4. Colonic diverticulosis. 5. Marked pelvic floor relaxation. 6. Aortic atherosclerosis. Aortic Atherosclerosis (ICD10-I70.0). Electronically Signed   By: Greig Pique M.D.   On: 05/26/2024 21:34     Procedures   Medications Ordered in the ED  cephALEXin  (KEFLEX ) capsule 500 mg (500 mg Oral Given 05/26/24 2205)                                    Medical Decision Making Amount and/or Complexity of Data Reviewed Labs: ordered. Radiology: ordered.  Risk Prescription drug management.   Trameka Mazell Aylesworth is here with concern for UTI some blood in the urine.  Adrienne [name] is a chronic Foley catheter.  Adrienne [name] is on Eliquis .  Foley catheter is draining well.  Adrienne [name] has pink-tinged urine in her Foley.  Adrienne [name] has not had any nausea or vomiting.  No diarrhea.  Adrienne [name] feels very well.  Adrienne [name] feels like similar to when Adrienne [name] gets urinary tract infections.  Overall have no concern for sepsis as Adrienne [name] has no fevers Adrienne [name] is very well-appearing.  Adrienne [name] had urinalysis done in the ED that was concerning for infection.  A Foley catheter was exchanged.  A CT scan was done that showed no acute process otherwise.  Ultimately we gave her Keflex  here and will have her take the same at home.  Adrienne [name] was told to return if Adrienne [name] develop fever as Adrienne [name] may need further infectious workup.  Overall I have no concern for sepsis at this time.  I have no concern for other acute process Adrienne [name] understands the plan.  Discharge.  This chart was dictated using voice recognition software.  Despite best efforts to proofread,  errors can occur which can change the documentation meaning.      Final diagnoses:  Acute cystitis without hematuria    ED Discharge Orders          Ordered    cephALEXin  (KEFLEX ) 500 MG capsule  3 times daily        05/26/24 2242               Ruthe Cornet,  DO 05/26/24 2246

## 2024-05-26 NOTE — ED Triage Notes (Signed)
 Pt advises she thinks she has UTI, bloody urine, lower abd pain, 'a little in my back.  Hx UTI, advises it feels similar.

## 2024-05-27 NOTE — Progress Notes (Unsigned)
 Complex Care Management Note Care Guide Note  05/27/2024 Name: Adrienne [name] MRN: [id number] DOB: [birth year]   Complex Care Management Outreach Attempts: An unsuccessful telephone outreach was attempted today to offer the patient information about available complex care management services.  Follow Up Plan:  Additional outreach attempts will be made to offer the patient complex care management information and services.   Encounter Outcome:  Patient Request to Call Back  Harlene Satterfield  [facility]  [facility], [facility] Guide  Direct Dial: [phone]  Fax [phone]

## 2024-05-28 NOTE — Progress Notes (Signed)
 Complex Care Management Note  Care Guide Note 05/28/2024 Name: Nalea Salce MRN: [id number] DOB: [birth year]  Nickayla Mcinnis Gillin is a 89 y.o. year old female who sees Cleotilde Planas, MD for primary care. I reached out to [employer] by phone today to offer complex care management services.  Ms. Gunnoe was given information about Complex Care Management services today including:   The Complex Care Management services include support from the care team which includes your Nurse Care Manager, Clinical Social Worker, or Pharmacist.  The Complex Care Management team is here to help remove barriers to the health concerns and goals most important to you. Complex Care Management services are voluntary, and the patient may decline or stop services at any time by request to their care team member.   Complex Care Management Consent Status: Patient agreed to services and verbal consent obtained.   Follow up plan:  Telephone appointment with complex care management team member scheduled for:  05/29/24  Encounter Outcome:  Patient Scheduled  Harlene Satterfield  [facility]  [facility], [facility] Guide  Direct Dial: [phone]  Fax [phone]

## 2024-05-29 ENCOUNTER — Other Ambulatory Visit: Payer: Self-pay

## 2024-05-29 NOTE — Patient Instructions (Signed)
 Visit Information  Thank you for taking time to visit with me today. Please don't hesitate to contact me if I can be of assistance to you before our next scheduled appointment.  Our next appointment is by telephone on 06/12/24 at 1:30 PM Please call the care guide team at [phone] if you need to cancel or reschedule your appointment.   Following is a copy of your care plan:   Goals Addressed             This Visit's Progress    VBCI RN Care Plan   On track    Problems:  Chronic Disease Management support and education needs related to CHF and frequent ED visits related to chronic foley  Goal: Over the next 30 days the Patient will demonstrate a decrease UTI in exacerbations as evidenced by patient report of catheter flowing well, no symptoms of UTI (suprapubic discomfort, blood in urine, odor to urine) experience decrease in ED visits as evidenced by electronic medical record review; ED visits in last 6 months = 4 take all medications exactly as prescribed and will call provider for medication related questions as evidenced by patient report of medication adherence     Interventions:   Heart Failure Interventions: Provided education on low sodium diet Reviewed role of diuretics in prevention of fluid overload and management of heart failure;  Evaluation of current treatment plan related to chronic foley catheter self-management and patient's adherence to plan as established by provider. Discussed plans with patient for ongoing care management follow up and provided patient with direct contact information for care management team Provided education to patient re: signs and symptoms of UTI Reviewed medications with patient and discussed importance of taking full course of antibiotics Reviewed scheduled/upcoming provider appointments including urology (next week per patient) Discussed plans with patient for ongoing care management follow up and provided patient with direct contact  information for care management team Screening for signs and symptoms of depression related to chronic disease state  Assessed social determinant of health barriers  Patient Self-Care Activities:  Attend all scheduled provider appointments Call provider office for new concerns or questions  Take medications as prescribed   use salt in moderation Continue to monitor for signs and symptoms of UTI  Plan:  Telephone follow up appointment with care management team member scheduled for:  06/12/24 at 1:30 PM to complete initial assessment             Please call the Suicide and Crisis Lifeline: 988 call 1-800-273-TALK (toll free, 24 hour hotline) if you are experiencing a Mental Health or Behavioral Health Crisis or need someone to talk to.  Patient verbalizes understanding of instructions and care plan provided today and agrees to view in MyChart. Active MyChart status and patient understanding of how to access instructions and care plan via MyChart confirmed with patient.     Rosaline Finlay, RN MSN   VBCI Population Health RN Care Manager Direct Dial: [phone]  Fax: [phone]

## 2024-05-29 NOTE — Patient Outreach (Signed)
 Complex Care Management   Visit Note  05/29/2024  Name:  Adrienne [name] MRN: [id number] DOB: 01/01/1928  Situation: Referral received for Complex Care Management related to Heart Failure and multiple ED visits related to chronic foley catheter I obtained verbal consent from Patient.  Visit completed with Patient  on the phone  Background:   Past Medical History:  Diagnosis Date   Afib (HCC)    Arthritis of ankle    CAD (coronary artery disease)    Cancer (HCC) 2012   breast cancer-right   Compression fracture of T12 vertebra (HCC)    Congestive heart disease (HCC)    Diverticulosis    Edema    Elevated uric acid in blood    GERD (gastroesophageal reflux disease)    GI bleed 12/2015   Hyperlipidemia    Hypertension    Long-term (current) use of anticoagulants    Moderate mitral regurgitation    Personal history of radiation therapy    Pulmonary HTN (HCC)    Echo 1/19: EF 60-65, no RWMA, Ao sclerosis, trivial AI, mild MR, mod BAE, mod TR, PASP 49   Stroke (HCC) 01/25/2016   TIA (transient ischemic attack) 09/2011   Urinary incontinence    Urinary retention     Assessment: Patient Reported Symptoms:  Cognitive Cognitive Status: Able to follow simple commands, Alert and oriented to person, place, and time, Normal speech and language skills Cognitive/Intellectual Conditions Management [RPT]: None reported or documented in medical history or problem list   Health Maintenance Behaviors: Annual physical exam, Healthy diet Health Facilitated by: Healthy diet  Neurological Neurological Review of Symptoms: Not assessed    HEENT HEENT Symptoms Reported: Other: HEENT Management Strategies: Routine screening    Cardiovascular Cardiovascular Symptoms Reported: Not assessed    Respiratory Respiratory Symptoms Reported: No symptoms reported Additional Respiratory Details: Patient reports she rarely needs PRN Albuterol . She wears 2L O2 at nigthttime via nasal  cannula Respiratory Management Strategies: Medication therapy, Oxygen  therapy  Endocrine Endocrine Symptoms Reported: Not assessed    Gastrointestinal Gastrointestinal Symptoms Reported: Unintentional weight loss, Change in appetite Additional Gastrointestinal Details: Patient reports her appetite is fine. She feels that she has lost weight due to not eating as much as she used to. She takes Miralax  every day, last BM today. Gastrointestinal Management Strategies: Coping strategies, Medication therapy Gastrointestinal Comment: Eat breakfast in their unit, groceries delivered. Lunch and dinner in cafeteria at [facility].    Genitourinary Genitourinary Symptoms Reported: Other Other Genitourinary Symptoms: Patient gets foley catheter changed monthly at [facility]. Last changed 05/26/24 in the ED. Patient has had 4 ED visits in the past 6 months, 3 of which were related to catheter discomfort and/or UTI. She is currently on antibiotics prescribed at most recent ED visit. She reports next visit with urology is next week. Patient currently denies feelings of suprapubic discomfort, blood in urine, or odor to urine. She reports catheter is flowing well. Genitourinary Management Strategies: Catheter, indwelling, Medication therapy  Integumentary Integumentary Symptoms Reported: Not assessed    Musculoskeletal Musculoskelatal Symptoms Reviewed: Not assessed        Psychosocial Psychosocial Symptoms Reported: Not assessed     Quality of Family Relationships: helpful, involved, supportive Do you feel physically threatened by others?: No    Medications Reviewed Today     Reviewed by Arno Rosaline SQUIBB, RN (Registered Nurse) on 05/29/24 at 1156  Med List Status: <None>   Medication Order Taking? Sig Documenting Provider Last Dose Status Informant  acetaminophen  (TYLENOL )  500 MG tablet [id number] Yes Take 1,000 mg by mouth every 12 (twelve) hours as needed for mild pain. [provider]   Active Self, Pharmacy Records           Med Note STEPHEN, [facility]   Fri Apr 21, 2023 10:04 AM)    albuterol  (VENTOLIN  HFA) 108 [phone] Base) MCG/ACT inhaler [id number] Yes Inhale 1 puff into the lungs every 6 (six) hours as needed for wheezing or shortness of breath (Cough). Regalado, Belkys A, MD  Active Self, Pharmacy Records           Med Note STEPHEN, [facility]   Fri Apr 21, 2023 10:04 AM)    allopurinol  (ZYLOPRIM ) 100 MG tablet [id number]  Take 1 tablet (100 mg total) by mouth daily. Regalado, Belkys A, MD  Active Self, Pharmacy Records           Med Note STEPHEN, [facility]   Fri Apr 21, 2023 10:04 AM)    ascorbic acid  (VITAMIN C ) 500 MG tablet [id number] Yes Take 500 mg by mouth 2 (two) times daily. [provider]  Active Self, Pharmacy Records  atorvastatin  (LIPITOR) 10 MG tablet [id number] Yes Take 1 tablet (10 mg total) by mouth daily. Jeffrie Oneil BROCKS, MD  Active   B Complex Vitamins (VITAMIN B COMPLEX ) CAPS [id number] Yes Take 1 capsule by mouth daily. [provider]  Active Self, Pharmacy Records  calcium  carbonate (OSCAL) 1500 (600 Ca) MG TABS tablet [id number] Yes Take 1,500 mg by mouth 2 (two) times daily with a meal. [provider]  Active Self, Pharmacy Records  cefTRIAXone  Sodium (ROCEPHIN  IJ) [id number]  Inject as directed.  Patient not taking: Reported on 05/29/2024   [provider]  Consider Medication Status and Discontinue (Completed Course)   cephALEXin  (KEFLEX ) 500 MG capsule [id number] Yes Take 1 capsule (500 mg total) by mouth 3 (three) times daily for 5 days. Ruthe Cornet, DO  Active   cyanocobalamin  (VITAMIN B12) 1000 MCG tablet [id number] Yes Take 1,000 mcg by mouth daily. [provider]  Active Self, Pharmacy Records  docusate sodium  (COLACE) 100 MG capsule [id number] Yes Take 100 mg by mouth daily as needed. [provider]  Active Self, Pharmacy Records  ELIQUIS  2.5 MG TABS tablet [id number] Yes TAKE 1 TABLET TWICE A DAY  Skains, Mark C, MD  Active   Ferrous Sulfate  (IRON ) 325 (65 Fe) MG TABS [id number] Yes Take 325 mg by mouth daily. (OTC) [provider]  Active Self, Pharmacy Records  furosemide  (LASIX ) 40 MG tablet [id number] Yes Take 1 tablet (40 mg total) by mouth daily. Jeffrie Oneil BROCKS, MD  Active   meloxicam [facility]) 15 MG tablet [id number] Yes Take 15 mg by mouth daily. [provider]  Active   metoprolol  tartrate (LOPRESSOR ) 100 MG tablet [id number] Yes Take 1 tablet (100 mg total) by mouth 2 (two) times daily. Jeffrie Oneil BROCKS, MD  Active   Multiple Vitamins-Minerals (MULTIVITAMIN WITH MINERALS) tablet [id number] Yes Take 1 tablet by mouth daily. [provider]  Active Self, Pharmacy Records  omeprazole (PRILOSEC) 10 MG capsule [id number] Yes Take 10 mg by mouth every other day.  Patient taking differently: Take 10 mg by mouth daily.   [provider]  Active Self, Pharmacy Records           Med Note STEPHEN KLINEFELTER   Wed Aug 25, 2021 11:18 AM)    OXYGEN  [id number] Yes Inhale 2 L into the lungs. [provider]  Active  polyethylene glycol (MIRALAX  / GLYCOLAX ) 17 g packet [id number] Yes Take 17 g by mouth at bedtime. [provider]  Active Self, Pharmacy Records  potassium chloride  SA (KLOR-CON  M) 20 MEQ tablet [id number] Yes Take 1 tablet (20 mEq total) by mouth daily. Arrien, Elidia Sieving, MD  Active   Probiotic Product (UP4 PROBIOTICS WOMENS PO) [id number]  Take 1 capsule by mouth daily with breakfast. [provider]  Active Self, Pharmacy Records  pyridOXINE (B-6) 50 MG tablet [id number]  1 tablet Orally [provider]  Active             Recommendation:   Continue Current Plan of Care  Follow Up Plan:   Telephone follow up appointment date/time:  06/12/24 at 1:30 PM to complete initial assessment  Rosaline Finlay, RN MSN [place]  [facility] RN Care Manager Direct Dial: [phone]  Fax:  [phone]

## 2024-05-29 NOTE — Progress Notes (Signed)
 GYNECOLOGY  VISIT   HPI: 89 y.o.   Married  Caucasian female   [phone] with Patient's last menstrual period was 09/12/1973 (approximate).   here for: 5 week follow up - prolapse. Has been using coconut aloe oil. Currently on cephalexin  for UTI.       Followed for complete uterovaginal prolapse and vaginal ulceration.  At her visit on 04/10/24, she had a 1.5 cm ulceration of the left vaginal mucosa and small area of granulation tissue of the left apical vagina, treated with AgNO3.  Has very little bleeding, less than usual.  Using cocout oil.   Has a foley catheter chronically due to prolapse and inability to use a pessary due to prior bleeding with this in the past.   She is on Eliquis .   Patient and her husband have CHF.   GYNECOLOGIC HISTORY: Patient's last menstrual period was 09/12/1973 (approximate). Contraception:  PMP Menopausal hormone therapy:  n/a Last 2 paps:  03/31/22 neg, 03/11/19 neg   History of abnormal Pap or positive HPV:  no Mammogram:  12/07/23 Breast Density Cat C, BIRADS Cat 2 benign        OB History     Gravida  3   Para  3   Term  3   Preterm      AB      Living  3      SAB      IAB      Ectopic      Multiple      Live Births  3              Patient Active Problem List   Diagnosis Date Noted   UTI (urinary tract infection) 06/03/2023   Cerebrovascular disease 05/05/2023   Hypokalemia 05/04/2023   Normocytic anemia 05/04/2023   Mitral regurgitation 04/20/2023   Hydronephrosis of left kidney 03/24/2023   CKD (chronic kidney disease), stage IIIb (HCC) 03/23/2023   UTI (urinary tract infection) due to urinary indwelling catheter (HCC) 02/04/2023   Hyponatremia 12/16/2022   AKI (acute kidney injury) (HCC) 12/16/2022   Acute cystitis with hydronephrosis due to indwelling foley catheter 12/15/2022   Acute respiratory failure with hypoxia (HCC) 09/03/2022   CAP (community acquired pneumonia) 09/03/2022   Catheter-associated  urinary tract infection (HCC) 09/03/2022   Open knee wound 10/05/2021   (HFpEF) heart failure with preserved ejection fraction (HCC) 08/18/2021   Palliative care by specialist    Goals of care, counseling/discussion    DNR (do not resuscitate) discussion    Vaginal bleeding 08/08/2020   Uterine prolapse 08/08/2020   Vaginal erosion secondary to pessary use (HCC) 08/08/2020   Acute urinary retention 08/08/2020   Chronic respiratory failure (HCC) 08/08/2020   Multiple pulmonary nodules 05/24/2020   Cough 05/24/2020   Malignant neoplasm of upper-inner quadrant of left breast in female, estrogen receptor negative (HCC) 12/20/2019   Malignant neoplasm of overlapping sites of right breast in female, estrogen receptor positive (HCC) 08/14/2019   Osteopenia 08/14/2019   Diplopia 01/29/2016   GERD (gastroesophageal reflux disease) 01/29/2016   Cerebrovascular accident (CVA) due to embolism of left middle cerebral artery (HCC) 01/29/2016   Bleeding gastrointestinal    Gait disturbance, post-stroke 01/27/2016   Fall    Secondary hypertension, unspecified    Cerebral infarction due to thrombosis of left middle cerebral artery (HCC) s/p mechanical thrombectomy    Malnutrition of moderate degree 01/09/2016   GI bleed 01/06/2016   Permanent atrial fibrillation (HCC) 06/10/2015  Chronic anticoagulation 06/10/2015   Hyperlipidemia 06/10/2015   Essential hypertension 06/10/2015   History of stroke 06/10/2015   Coronary artery disease due to lipid rich plaque 06/10/2015   Elevated uric acid in blood    Arthritis of ankle    Pulmonary HTN (HCC)     Past Medical History:  Diagnosis Date   Afib (HCC)    Arthritis of ankle    CAD (coronary artery disease)    Cancer (HCC) 2012   breast cancer-right   Compression fracture of T12 vertebra (HCC)    Congestive heart disease (HCC)    Diverticulosis    Edema    Elevated uric acid in blood    GERD (gastroesophageal reflux disease)    GI bleed  12/2015   Hyperlipidemia    Hypertension    Long-term (current) use of anticoagulants    Moderate mitral regurgitation    Personal history of radiation therapy    Pulmonary HTN (HCC)    Echo 1/19: EF 60-65, no RWMA, Ao sclerosis, trivial AI, mild MR, mod BAE, mod TR, PASP 49   Stroke (HCC) 01/25/2016   TIA (transient ischemic attack) 09/2011   Urinary incontinence    Urinary retention     Past Surgical History:  Procedure Laterality Date   APPENDECTOMY     AXILLARY SENTINEL NODE BIOPSY Left 01/22/2020   Procedure: Left Axillary Sentinel Node Biopsy;  Surgeon: Ebbie Cough, MD;  Location: [facility] OR;  Service: General;  Laterality: Left;   BREAST LUMPECTOMY WITH RADIOACTIVE SEED AND SENTINEL LYMPH NODE BIOPSY Left 01/22/2020   Procedure: LEFT BREAST LUMPECTOMY WITH RADIOACTIVE SEED;  Surgeon: Ebbie Cough, MD;  Location: [facility] OR;  Service: General;  Laterality: Left;   BREAST SURGERY  2012   Rt.mastectomy--[place], [place]   CARDIAC CATHETERIZATION  2013   CATARACT EXTRACTION, BILATERAL     CERVICAL CONIZATION W/BX N/A 08/08/2020   Procedure: VAGINAL SIDEWALL BIOPSY;  Surgeon: Cleotilde Ronal RAMAN, MD;  Location: [facility] OR;  Service: Gynecology;  Laterality: N/A;   DILATION AND CURETTAGE OF UTERUS     in her early 15's for DUB   ESOPHAGOGASTRODUODENOSCOPY Left 01/08/2016   Procedure: ESOPHAGOGASTRODUODENOSCOPY (EGD);  Surgeon: Elsie Cree, MD;  Location: [facility];  Service: Endoscopy;  Laterality: Left;   GALLBLADDER SURGERY     MASTECTOMY Right 2012   RADIOLOGY WITH ANESTHESIA N/A 01/25/2016   Procedure: RADIOLOGY WITH ANESTHESIA;  Surgeon: Thyra Nash, MD;  Location: [facility] OR;  Service: Radiology;  Laterality: N/A;   REPLACEMENT TOTAL KNEE BILATERAL     TONSILLECTOMY AND ADENOIDECTOMY     as a child    Current Outpatient Medications  Medication Sig Dispense Refill   acetaminophen  (TYLENOL ) 500 MG tablet Take 1,000 mg by mouth every 12 (twelve) hours as needed for mild pain.      albuterol  (VENTOLIN  HFA) 108 (90 Base) MCG/ACT inhaler Inhale 1 puff into the lungs every 6 (six) hours as needed for wheezing or shortness of breath (Cough). 18 g 2   allopurinol  (ZYLOPRIM ) 100 MG tablet Take 1 tablet (100 mg total) by mouth daily. 30 tablet 1   ascorbic acid  (VITAMIN C ) 500 MG tablet Take 500 mg by mouth 2 (two) times daily.     atorvastatin  (LIPITOR) 10 MG tablet Take 1 tablet (10 mg total) by mouth daily. 90 tablet 3   B Complex Vitamins (VITAMIN B COMPLEX ) CAPS Take 1 capsule by mouth daily.     calcium  carbonate (OSCAL) 1500 (600 Ca) MG TABS tablet Take 1,500  mg by mouth 2 (two) times daily with a meal.     cyanocobalamin  (VITAMIN B12) 1000 MCG tablet Take 1,000 mcg by mouth daily.     docusate sodium  (COLACE) 100 MG capsule Take 100 mg by mouth daily as needed.     ELIQUIS  2.5 MG TABS tablet TAKE 1 TABLET TWICE A DAY 180 tablet 3   Ferrous Sulfate  (IRON ) 325 (65 Fe) MG TABS Take 325 mg by mouth daily. (OTC)     furosemide  (LASIX ) 40 MG tablet Take 1 tablet (40 mg total) by mouth daily. 90 tablet 2   metoprolol  tartrate (LOPRESSOR ) 100 MG tablet Take 1 tablet (100 mg total) by mouth 2 (two) times daily. 180 tablet 3   mineral oil-hydrophilic petrolatum (AQUAPHOR) ointment Apply topically as needed for dry skin. 420 g 0   Multiple Vitamins-Minerals (MULTIVITAMIN WITH MINERALS) tablet Take 1 tablet by mouth daily.     OXYGEN  Inhale 2 L into the lungs.     polyethylene glycol (MIRALAX  / GLYCOLAX ) 17 g packet Take 17 g by mouth at bedtime.     potassium chloride  SA (KLOR-CON  M) 20 MEQ tablet Take 1 tablet (20 mEq total) by mouth daily. 30 tablet 0   Probiotic Product (UP4 PROBIOTICS WOMENS PO) Take 1 capsule by mouth daily with breakfast.     pyridOXINE (B-6) 50 MG tablet 1 tablet Orally     cefTRIAXone  Sodium (ROCEPHIN  IJ) Inject as directed. (Patient not taking: Reported on 05/30/2024)     meloxicam (MOBIC) 15 MG tablet Take 15 mg by mouth daily. (Patient not taking:  Reported on 05/30/2024)     omeprazole (PRILOSEC) 10 MG capsule Take 10 mg by mouth every other day. (Patient not taking: Reported on 05/30/2024)     No current facility-administered medications for this visit.     ALLERGIES: Valium [diazepam]; Amiodarone hcl [amiodarone]; Compazine [prochlorperazine]; Other; Thorazine [chlorpromazine]; Zometa [zoledronic acid]; Ciprofloxacin ; Doxycycline ; Zoster vac recomb adjuvanted; Zoster vaccine recombinant, adjuvanted; and Iodinated contrast media  Family History  Problem Relation Age of Onset   Asthma Mother    CVA Father    Atrial fibrillation Sister        HAS PACER   Pneumonia Brother    Cancer Daughter        COLON CANCER   Cancer Maternal Grandmother        breast cancer    Social History   Socioeconomic History   Marital status: Married    Spouse name: Not on file   Number of children: Not on file   Years of education: Not on file   Highest education level: Not on file  Occupational History   Not on file  Tobacco Use   Smoking status: Former    Current packs/day: 0.00    Average packs/day: 0.3 packs/day for 2.0 years (0.5 ttl pk-yrs)    Types: Cigarettes    Start date: 1    Quit date: 11    Years since quitting: 75.7   Smokeless tobacco: Never  Vaping Use   Vaping status: Never Used  Substance and Sexual Activity   Alcohol use: No    Alcohol/week: 0.0 standard drinks of alcohol   Drug use: No   Sexual activity: Not Currently    Partners: Male    Birth control/protection: Post-menopausal    Comment: Less than 5, after 18, no abnormal pap, no STD, no DES, hx of breast cancer  Other Topics Concern   Not on file  Social History Narrative  Not on file   Social Drivers of Health   Financial Resource Strain: Not on file  Food Insecurity: No Food Insecurity (05/29/2024)   Hunger Vital Sign    Worried About Running Out of Food in the Last Year: Never true    Ran Out of Food in the Last Year: Never true   Transportation Needs: No Transportation Needs (05/29/2024)   PRAPARE - Administrator, Civil Service (Medical): No    Lack of Transportation (Non-Medical): No  Physical Activity: Not on file  Stress: Not on file  Social Connections: Not on file  Intimate Partner Violence: Not At Risk (05/29/2024)   Humiliation, Afraid, Rape, and Kick questionnaire    Fear of Current or Ex-Partner: No    Emotionally Abused: No    Physically Abused: No    Sexually Abused: No    Review of Systems  All other systems reviewed and are negative.   PHYSICAL EXAMINATION:   BP 116/70 (BP Location: Left Arm, Patient Position: Sitting)   Pulse 93   LMP 09/12/1973 (Approximate)   SpO2 97%     General appearance: alert, cooperative and appears stated age   Pelvic: External genitalia:  no lesions.  Foley catheter in place.               Urethra:  normal appearing urethra with no masses, tenderness or lesions              Bartholins and Skenes: normal                 Vagina: uterine procidentia noted and left vaginal apical granulation tissue present, which was treated with silver nitrate after given verbal permission.               Cervix: no lesions                Bimanual Exam:  Uterus:  normal size, contour, position, consistency, mobility, non-tender              Adnexa: no mass, fullness, tenderness          Chaperone was present for exam:  Kari HERO, CMA  ASSESSMENT:  Uterine procidentia.  Vaginal granulation tissue.  Chronic catheterization of bladder.   Managed by urology.  Current UTI. On Eliquis .   PLAN:  We discussed the likelihood of continued granulation tissue due to prolapse.  This can be treated if she develops bleeding.  I think she can be seen every 6 months and prn.   20 min  total time was spent for this patient encounter, including preparation, face-to-face counseling with the patient, coordination of care, and documentation of the encounter.

## 2024-05-30 ENCOUNTER — Ambulatory Visit (INDEPENDENT_AMBULATORY_CARE_PROVIDER_SITE_OTHER): Admitting: Obstetrics and Gynecology

## 2024-05-30 ENCOUNTER — Encounter: Payer: Self-pay | Admitting: Obstetrics and Gynecology

## 2024-05-30 VITALS — BP 116/70 | HR 93

## 2024-05-30 DIAGNOSIS — R338 Other retention of urine: Secondary | ICD-10-CM | POA: Diagnosis not present

## 2024-05-30 DIAGNOSIS — N898 Other specified noninflammatory disorders of vagina: Secondary | ICD-10-CM

## 2024-05-30 DIAGNOSIS — N813 Complete uterovaginal prolapse: Secondary | ICD-10-CM | POA: Diagnosis not present

## 2024-05-30 DIAGNOSIS — R3914 Feeling of incomplete bladder emptying: Secondary | ICD-10-CM | POA: Diagnosis not present

## 2024-05-30 LAB — URINE CULTURE: Culture: >=100000 — AB

## 2024-05-30 MED ORDER — AQUAPHOR EX OINT: TOPICAL_OINTMENT | CUTANEOUS | 0 refills | Status: AC | PRN

## 2024-05-31 ENCOUNTER — Telehealth (HOSPITAL_BASED_OUTPATIENT_CLINIC_OR_DEPARTMENT_OTHER): Payer: Self-pay

## 2024-05-31 NOTE — Telephone Encounter (Signed)
 Post ED Visit - Positive Culture Follow-up: Successful Patient Follow-Up  Culture assessed and recommendations reviewed by:  [x]  Koren Or, Pharm.D. []  Venetia Gully, [address].D., BCPS AQ-ID []  Garrel Crews, Pharm.D., BCPS []  Almarie Lunger, Pharm.D., BCPS []  [place], [address].D., BCPS, AAHIVP []  Rosaline Bihari, Pharm.D., BCPS, AAHIVP []  Vernell Meier, PharmD, BCPS []  Latanya Hint, PharmD, BCPS []  Donald Medley, PharmD, BCPS []  Rocky Bold, PharmD  Positive urine culture  []  Patient discharged without antimicrobial prescription and treatment is now indicated []  Organism is resistant to prescribed ED discharge antimicrobial []  Patient with positive blood cultures  Changes discussed with ED provider: Redell Pinal, MD New antibiotic prescription Cipro  250 mo po BID x 7 days  Plan: call for s/s check. If not better stop keflex  and start cipro  250 mg po BID x 7 days.   Pt lives at [facility] on [address]. Spoke to pt caregiver [employer]. Per caregiver there has not be any reports of UTI s/s. Caregiver states she will share these results and recommendations with their provider. Urine culture results and recommendations emailed to [email or web address].   Contacted patient caregiver, date 05/31/2024, time 12:42 pm   Ruth Camelia Elbe 05/31/2024, 12:45 PM

## 2024-06-02 DIAGNOSIS — U071 COVID-19: Secondary | ICD-10-CM | POA: Diagnosis not present

## 2024-06-12 ENCOUNTER — Other Ambulatory Visit: Payer: PRIVATE HEALTH INSURANCE

## 2024-06-12 NOTE — Patient Instructions (Signed)
 Visit Information  Thank you for taking time to visit with me today. Please don't hesitate to contact me if I can be of assistance to you before our next scheduled appointment.  Your next care management appointment is by telephone on 06/26/24 at 2:30 PM  Please call the care guide team at [phone] if you need to cancel, schedule, or reschedule an appointment.   Please call the Suicide and Crisis Lifeline: 988 call 1-800-273-TALK (toll free, 24 hour hotline) if you are experiencing a Mental Health or Behavioral Health Crisis or need someone to talk to.  Rosaline Finlay, RN MSN [place]  [facility] Population Health RN Care Manager Direct Dial: [phone]  Fax: [phone]

## 2024-06-12 NOTE — Patient Outreach (Signed)
 Complex Care Management   Visit Note  06/12/2024  Name:  Adrienne [name] MRN: [id number] DOB: 04-04-1928  Situation: Referral received for Complex Care Management related to Heart Failure and frequent hospitalizations I obtained verbal consent from Patient.  Visit completed with Patient  on the phone  Background:   Past Medical History:  Diagnosis Date   Afib (HCC)    Arthritis of ankle    CAD (coronary artery disease)    Cancer (HCC) 2012   breast cancer-right   Compression fracture of T12 vertebra (HCC)    Congestive heart disease (HCC)    Diverticulosis    Edema    Elevated uric acid in blood    GERD (gastroesophageal reflux disease)    GI bleed 12/2015   Hyperlipidemia    Hypertension    Long-term (current) use of anticoagulants    Moderate mitral regurgitation    Personal history of radiation therapy    Pulmonary HTN (HCC)    Echo 1/19: EF 60-65, no RWMA, Ao sclerosis, trivial AI, mild MR, mod BAE, mod TR, PASP 49   Stroke (HCC) 01/25/2016   TIA (transient ischemic attack) 09/2011   Urinary incontinence    Urinary retention     Assessment: Patient Reported Symptoms:  Cognitive Cognitive Status: Able to follow simple commands, Alert and oriented to person, place, and time, Normal speech and language skills Cognitive/Intellectual Conditions Management [RPT]: Brain Injury Brain Injury: History of CVA   Health Maintenance Behaviors: Annual physical exam, Healthy diet Health Facilitated by: Healthy diet  Neurological Neurological Review of Symptoms: Weakness Neurological Management Strategies: Medication therapy, Routine screening  HEENT HEENT Symptoms Reported: No symptoms reported HEENT Management Strategies: Routine screening HEENT Comment: Patient reports she had partial plate placed 0/69/74. She reports it is working well for her so far, she is just trying to get used to it.    Cardiovascular Cardiovascular Symptoms Reported: No symptoms reported Does  patient have uncontrolled Hypertension?: No (Confirmed patient does have BP monitor and is checking regularly at home) Cardiovascular Management Strategies: Medication therapy, Routine screening, Weight management Do You Have a Working Readable Scale?: Yes Weight: 101 lb (45.8 kg) (Patient reported)  Respiratory Respiratory Symptoms Reported: No symptoms reported Additional Respiratory Details: Noted per chart review patient tested positive for COVID 06/02/24. She is no longer having to quarantine. Respiratory Management Strategies: Medication therapy, Oxygen  therapy  Endocrine Endocrine Symptoms Reported: No symptoms reported Is patient diabetic?: No    Gastrointestinal Gastrointestinal Symptoms Reported: Change in appetite, Unintentional weight loss Additional Gastrointestinal Details: Patient continues to report appetite is fine, resulting in gradual weight loss over time. Gastrointestinal Management Strategies: Coping strategies, Medication therapy    Genitourinary Genitourinary Symptoms Reported: No symptoms reported Additional Genitourinary Details: Patient notes she has completed antibiotics reported at previous [facility] visit Genitourinary Management Strategies: Catheter, indwelling Genitourinary Comment: Patient reports she has an appointment for monthly catheter changed tomorrow 06/13/24  Integumentary Integumentary Symptoms Reported: No symptoms reported    Musculoskeletal Musculoskelatal Symptoms Reviewed: Weakness, Unsteady gait Musculoskeletal Management Strategies: Adequate rest, Coping strategies, Medical device (Rollator outside of apartment, nothing inside of apartment) Falls in the past year?: Yes Number of falls in past year: 1 or less Was there an injury with Fall?: No Fall Risk Category Calculator: 1 Patient Fall Risk Level: Low Fall Risk Patient at Risk for Falls Due to: History of fall(s), Impaired balance/gait Fall risk Follow up: Falls evaluation completed, Education  provided, Falls prevention discussed  Psychosocial Psychosocial Symptoms Reported: No symptoms  reported          06/12/2024    PHQ2-9 Depression Screening   Little interest or pleasure in doing things Several days  Feeling down, depressed, or hopeless Not at all  PHQ-2 - Total Score 1  Trouble falling or staying asleep, or sleeping too much    Feeling tired or having little energy    Poor appetite or overeating     Feeling bad about yourself - or that you are a failure or have let yourself or your family down    Trouble concentrating on things, such as reading the newspaper or watching television    Moving or speaking so slowly that other people could have noticed.  Or the opposite - being so fidgety or restless that you have been moving around a lot more than usual    Thoughts that you would be better off dead, or hurting yourself in some way    PHQ2-9 Total Score    If you checked off any problems, how difficult have these problems made it for you to do your work, take care of things at home, or get along with other people    Depression Interventions/Treatment      There were no vitals filed for this visit.  Medications Reviewed Today     Reviewed by Arno Rosaline SQUIBB, RN (Registered Nurse) on 06/12/24 at 1344  Med List Status: <None>   Medication Order Taking? Sig Documenting Provider Last Dose Status Informant  acetaminophen  (TYLENOL ) 500 MG tablet [id number]  Take 1,000 mg by mouth every 12 (twelve) hours as needed for mild pain. [provider]  Active Self, Pharmacy Records           Med Note STEPHEN, [facility]   Fri Apr 21, 2023 10:04 AM)    albuterol  (VENTOLIN  HFA) 108 (90 Base) MCG/ACT inhaler [id number]  Inhale 1 puff into the lungs every 6 (six) hours as needed for wheezing or shortness of breath (Cough). Regalado, Belkys A, MD  Active Self, Pharmacy Records           Med Note STEPHEN, [facility]   Fri Apr 21, 2023 10:04 AM)    allopurinol  (ZYLOPRIM ) 100 MG tablet  [id number]  Take 1 tablet (100 mg total) by mouth daily. Regalado, Belkys A, MD  Active Self, Pharmacy Records           Med Note STEPHEN, [facility]   Fri Apr 21, 2023 10:04 AM)    ascorbic acid  (VITAMIN C ) 500 MG tablet [id number]  Take 500 mg by mouth 2 (two) times daily. [provider]  Active Self, Pharmacy Records  atorvastatin  (LIPITOR) 10 MG tablet [id number]  Take 1 tablet (10 mg total) by mouth daily. Jeffrie Oneil BROCKS, MD  Active   B Complex Vitamins (VITAMIN B COMPLEX ) CAPS [id number]  Take 1 capsule by mouth daily. [provider]  Active Self, Pharmacy Records  calcium  carbonate (OSCAL) 1500 (600 Ca) MG TABS tablet [id number]  Take 1,500 mg by mouth 2 (two) times daily with a meal. [provider]  Active Self, Pharmacy Records  cefTRIAXone  Sodium (ROCEPHIN  IJ) [id number]  Inject as directed.  Patient not taking: Reported on 05/30/2024   [provider]  Active   cyanocobalamin  (VITAMIN B12) 1000 MCG tablet [id number]  Take 1,000 mcg by mouth daily. [provider]  Active Self, Pharmacy Records  docusate sodium  (COLACE) 100 MG capsule [id number]  Take 100 mg by mouth daily as needed. [provider]  Active Self,  Pharmacy Records  ELIQUIS  2.5 MG TABS tablet [id number]  TAKE 1 TABLET TWICE A DAY Skains, Mark C, MD  Active   Ferrous Sulfate  (IRON ) 325 (65 Fe) MG TABS [id number]  Take 325 mg by mouth daily. (OTC) [provider]  Active Self, Pharmacy Records  furosemide  (LASIX ) 40 MG tablet [id number]  Take 1 tablet (40 mg total) by mouth daily. Jeffrie Oneil BROCKS, MD  Expired 05/30/24 2359   meloxicam (MOBIC) 15 MG tablet [id number]  Take 15 mg by mouth daily.  Patient not taking: Reported on 05/30/2024   [provider]  Active   metoprolol  tartrate (LOPRESSOR ) 100 MG tablet [id number]  Take 1 tablet (100 mg total) by mouth 2 (two) times daily. Jeffrie Oneil BROCKS, MD  Active   mineral oil-hydrophilic petrolatum (AQUAPHOR) ointment  [id number]  Apply topically as needed for dry skin. Amundson C Silva, Brook E, MD  Active   Multiple Vitamins-Minerals (MULTIVITAMIN WITH MINERALS) tablet [id number]  Take 1 tablet by mouth daily. [provider]  Active Self, Pharmacy Records  omeprazole (PRILOSEC) 10 MG capsule [id number]  Take 10 mg by mouth every other day.  Patient not taking: Reported on 05/30/2024   [provider]  Active Self, Pharmacy Records           Med Note STEPHEN KLINEFELTER   Wed Aug 25, 2021 11:18 AM)    OXYGEN  [id number]  Inhale 2 L into the lungs. [provider]  Active   polyethylene glycol (MIRALAX  / GLYCOLAX ) 17 g packet [id number]  Take 17 g by mouth at bedtime. [provider]  Active Self, Pharmacy Records  potassium chloride  SA (KLOR-CON  M) 20 MEQ tablet [id number]  Take 1 tablet (20 mEq total) by mouth daily. Arrien, Elidia Sieving, MD  Active   Probiotic Product (UP4 PROBIOTICS WOMENS PO) [id number]  Take 1 capsule by mouth daily with breakfast. [provider]  Active Self, Pharmacy Records  pyridOXINE (B-6) 50 MG tablet [id number]  1 tablet Orally [provider]  Active             Recommendation:   Specialty provider follow-up urology 06/13/24 Continue Current Plan of Care  Follow Up Plan:   Telephone follow up appointment date/time:  06/26/24 at 2:30 PM  Rosaline Finlay, RN MSN [place]  [facility] RN Care Manager Direct Dial: [phone]  Fax: [phone]

## 2024-06-13 DIAGNOSIS — R339 Retention of urine, unspecified: Secondary | ICD-10-CM | POA: Diagnosis not present

## 2024-06-13 DIAGNOSIS — R338 Other retention of urine: Secondary | ICD-10-CM | POA: Diagnosis not present

## 2024-06-15 ENCOUNTER — Encounter (HOSPITAL_COMMUNITY): Payer: Self-pay

## 2024-06-15 ENCOUNTER — Emergency Department (HOSPITAL_COMMUNITY)

## 2024-06-15 ENCOUNTER — Other Ambulatory Visit: Payer: Self-pay

## 2024-06-15 ENCOUNTER — Inpatient Hospital Stay (HOSPITAL_COMMUNITY)
Admission: EM | Admit: 2024-06-15 | Discharge: 2024-06-20 | DRG: 389 | Disposition: A | Discharge status: Skilled Nursing Facility | Source: Skilled Nursing Facility | Attending: Family Medicine | Admitting: Family Medicine

## 2024-06-15 DIAGNOSIS — N2882 Megaloureter: Secondary | ICD-10-CM | POA: Diagnosis not present

## 2024-06-15 DIAGNOSIS — Z681 Body mass index (BMI) 19 or less, adult: Secondary | ICD-10-CM | POA: Diagnosis not present

## 2024-06-15 DIAGNOSIS — Z825 Family history of asthma and other chronic lower respiratory diseases: Secondary | ICD-10-CM

## 2024-06-15 DIAGNOSIS — I63412 Cerebral infarction due to embolism of left middle cerebral artery: Secondary | ICD-10-CM | POA: Diagnosis present

## 2024-06-15 DIAGNOSIS — Z887 Allergy status to serum and vaccine status: Secondary | ICD-10-CM

## 2024-06-15 DIAGNOSIS — E876 Hypokalemia: Secondary | ICD-10-CM | POA: Diagnosis present

## 2024-06-15 DIAGNOSIS — Z9049 Acquired absence of other specified parts of digestive tract: Secondary | ICD-10-CM | POA: Diagnosis not present

## 2024-06-15 DIAGNOSIS — R1084 Generalized abdominal pain: Secondary | ICD-10-CM | POA: Diagnosis not present

## 2024-06-15 DIAGNOSIS — Z800 Family history of malignant neoplasm of digestive organs: Secondary | ICD-10-CM

## 2024-06-15 DIAGNOSIS — Z888 Allergy status to other drugs, medicaments and biological substances status: Secondary | ICD-10-CM

## 2024-06-15 DIAGNOSIS — E440 Moderate protein-calorie malnutrition: Secondary | ICD-10-CM | POA: Diagnosis present

## 2024-06-15 DIAGNOSIS — D631 Anemia in chronic kidney disease: Secondary | ICD-10-CM | POA: Diagnosis present

## 2024-06-15 DIAGNOSIS — K6389 Other specified diseases of intestine: Secondary | ICD-10-CM | POA: Diagnosis not present

## 2024-06-15 DIAGNOSIS — Z9842 Cataract extraction status, left eye: Secondary | ICD-10-CM

## 2024-06-15 DIAGNOSIS — I679 Cerebrovascular disease, unspecified: Secondary | ICD-10-CM | POA: Diagnosis present

## 2024-06-15 DIAGNOSIS — C50811 Malignant neoplasm of overlapping sites of right female breast: Secondary | ICD-10-CM | POA: Diagnosis present

## 2024-06-15 DIAGNOSIS — N1832 Chronic kidney disease, stage 3b: Secondary | ICD-10-CM | POA: Diagnosis present

## 2024-06-15 DIAGNOSIS — K922 Gastrointestinal hemorrhage, unspecified: Secondary | ICD-10-CM | POA: Diagnosis present

## 2024-06-15 DIAGNOSIS — I130 Hypertensive heart and chronic kidney disease with heart failure and stage 1 through stage 4 chronic kidney disease, or unspecified chronic kidney disease: Secondary | ICD-10-CM | POA: Diagnosis present

## 2024-06-15 DIAGNOSIS — E785 Hyperlipidemia, unspecified: Secondary | ICD-10-CM | POA: Diagnosis present

## 2024-06-15 DIAGNOSIS — Z87891 Personal history of nicotine dependence: Secondary | ICD-10-CM

## 2024-06-15 DIAGNOSIS — Z9841 Cataract extraction status, right eye: Secondary | ICD-10-CM

## 2024-06-15 DIAGNOSIS — N390 Urinary tract infection, site not specified: Secondary | ICD-10-CM | POA: Diagnosis present

## 2024-06-15 DIAGNOSIS — I340 Nonrheumatic mitral (valve) insufficiency: Secondary | ICD-10-CM | POA: Diagnosis present

## 2024-06-15 DIAGNOSIS — K5730 Diverticulosis of large intestine without perforation or abscess without bleeding: Secondary | ICD-10-CM | POA: Diagnosis not present

## 2024-06-15 DIAGNOSIS — Z79899 Other long term (current) drug therapy: Secondary | ICD-10-CM

## 2024-06-15 DIAGNOSIS — K219 Gastro-esophageal reflux disease without esophagitis: Secondary | ICD-10-CM | POA: Diagnosis present

## 2024-06-15 DIAGNOSIS — K56609 Unspecified intestinal obstruction, unspecified as to partial versus complete obstruction: Secondary | ICD-10-CM | POA: Diagnosis not present

## 2024-06-15 DIAGNOSIS — Z791 Long term (current) use of non-steroidal anti-inflammatories (NSAID): Secondary | ICD-10-CM

## 2024-06-15 DIAGNOSIS — R140 Abdominal distension (gaseous): Secondary | ICD-10-CM | POA: Diagnosis not present

## 2024-06-15 DIAGNOSIS — D630 Anemia in neoplastic disease: Secondary | ICD-10-CM | POA: Diagnosis present

## 2024-06-15 DIAGNOSIS — Z66 Do not resuscitate: Secondary | ICD-10-CM | POA: Diagnosis present

## 2024-06-15 DIAGNOSIS — I272 Pulmonary hypertension, unspecified: Secondary | ICD-10-CM | POA: Diagnosis present

## 2024-06-15 DIAGNOSIS — F419 Anxiety disorder, unspecified: Secondary | ICD-10-CM | POA: Diagnosis present

## 2024-06-15 DIAGNOSIS — Z889 Allergy status to unspecified drugs, medicaments and biological substances status: Secondary | ICD-10-CM

## 2024-06-15 DIAGNOSIS — Z923 Personal history of irradiation: Secondary | ICD-10-CM | POA: Diagnosis not present

## 2024-06-15 DIAGNOSIS — Z881 Allergy status to other antibiotic agents status: Secondary | ICD-10-CM

## 2024-06-15 DIAGNOSIS — Z853 Personal history of malignant neoplasm of breast: Secondary | ICD-10-CM

## 2024-06-15 DIAGNOSIS — R933 Abnormal findings on diagnostic imaging of other parts of digestive tract: Secondary | ICD-10-CM | POA: Diagnosis not present

## 2024-06-15 DIAGNOSIS — I10 Essential (primary) hypertension: Secondary | ICD-10-CM | POA: Diagnosis present

## 2024-06-15 DIAGNOSIS — I2510 Atherosclerotic heart disease of native coronary artery without angina pectoris: Secondary | ICD-10-CM | POA: Diagnosis present

## 2024-06-15 DIAGNOSIS — Z7401 Bed confinement status: Secondary | ICD-10-CM | POA: Diagnosis not present

## 2024-06-15 DIAGNOSIS — Z8673 Personal history of transient ischemic attack (TIA), and cerebral infarction without residual deficits: Secondary | ICD-10-CM

## 2024-06-15 DIAGNOSIS — R188 Other ascites: Secondary | ICD-10-CM | POA: Diagnosis not present

## 2024-06-15 DIAGNOSIS — N183 Chronic kidney disease, stage 3 unspecified: Secondary | ICD-10-CM | POA: Diagnosis present

## 2024-06-15 DIAGNOSIS — I959 Hypotension, unspecified: Secondary | ICD-10-CM | POA: Diagnosis present

## 2024-06-15 DIAGNOSIS — J9611 Chronic respiratory failure with hypoxia: Secondary | ICD-10-CM | POA: Diagnosis present

## 2024-06-15 DIAGNOSIS — Z7901 Long term (current) use of anticoagulants: Secondary | ICD-10-CM | POA: Diagnosis not present

## 2024-06-15 DIAGNOSIS — I4821 Permanent atrial fibrillation: Secondary | ICD-10-CM | POA: Diagnosis present

## 2024-06-15 DIAGNOSIS — Z803 Family history of malignant neoplasm of breast: Secondary | ICD-10-CM

## 2024-06-15 DIAGNOSIS — I5032 Chronic diastolic (congestive) heart failure: Secondary | ICD-10-CM | POA: Diagnosis present

## 2024-06-15 DIAGNOSIS — Z91041 Radiographic dye allergy status: Secondary | ICD-10-CM

## 2024-06-15 DIAGNOSIS — Z9011 Acquired absence of right breast and nipple: Secondary | ICD-10-CM

## 2024-06-15 DIAGNOSIS — Z170 Estrogen receptor positive status [ER+]: Secondary | ICD-10-CM

## 2024-06-15 DIAGNOSIS — K5900 Constipation, unspecified: Secondary | ICD-10-CM | POA: Diagnosis not present

## 2024-06-15 DIAGNOSIS — R0902 Hypoxemia: Secondary | ICD-10-CM | POA: Diagnosis not present

## 2024-06-15 DIAGNOSIS — Z823 Family history of stroke: Secondary | ICD-10-CM

## 2024-06-15 DIAGNOSIS — E162 Hypoglycemia, unspecified: Secondary | ICD-10-CM | POA: Diagnosis not present

## 2024-06-15 DIAGNOSIS — K56600 Partial intestinal obstruction, unspecified as to cause: Principal | ICD-10-CM | POA: Diagnosis present

## 2024-06-15 DIAGNOSIS — I5030 Unspecified diastolic (congestive) heart failure: Secondary | ICD-10-CM | POA: Diagnosis present

## 2024-06-15 DIAGNOSIS — Z96653 Presence of artificial knee joint, bilateral: Secondary | ICD-10-CM | POA: Diagnosis present

## 2024-06-15 DIAGNOSIS — N8110 Cystocele, unspecified: Secondary | ICD-10-CM | POA: Diagnosis present

## 2024-06-15 LAB — URINALYSIS, ROUTINE W REFLEX MICROSCOPIC
Bilirubin Urine: NEGATIVE
Glucose, UA: NEGATIVE mg/dL
Ketones, ur: NEGATIVE mg/dL
Nitrite: NEGATIVE
Protein, ur: NEGATIVE mg/dL
RBC / HPF: >50 RBC/hpf (ref 0–5)
Specific Gravity, Urine: 1.014 (ref 1.005–1.030)
WBC, UA: >50 WBC/hpf (ref 0–5)
pH: 5 (ref 5.0–8.0)

## 2024-06-15 LAB — CBC WITH DIFFERENTIAL/PLATELET
Abs Immature Granulocytes: 0.02 K/uL (ref 0.00–0.07)
Basophils Absolute: 0 K/uL (ref 0.0–0.1)
Basophils Relative: 0 %
Eosinophils Absolute: 0 K/uL (ref 0.0–0.5)
Eosinophils Relative: 0 %
HCT: 37.2 % (ref 36.0–46.0)
Hemoglobin: 12.4 g/dL (ref 12.0–15.0)
Immature Granulocytes: 0 %
Lymphocytes Relative: 9 %
Lymphs Abs: 0.9 K/uL (ref 0.7–4.0)
MCH: 31.3 pg (ref 26.0–34.0)
MCHC: 33.3 g/dL (ref 30.0–36.0)
MCV: 93.9 fL (ref 80.0–100.0)
Monocytes Absolute: 0.6 K/uL (ref 0.1–1.0)
Monocytes Relative: 6 %
Neutro Abs: 9 K/uL — ABNORMAL HIGH (ref 1.7–7.7)
Neutrophils Relative %: 85 %
Platelets: 302 K/uL (ref 150–400)
RBC: 3.96 MIL/uL (ref 3.87–5.11)
RDW: 13.7 % (ref 11.5–15.5)
WBC: 10.5 K/uL (ref 4.0–10.5)
nRBC: 0 % (ref 0.0–0.2)

## 2024-06-15 LAB — CBC
HCT: 35.8 % — ABNORMAL LOW (ref 36.0–46.0)
Hemoglobin: 12.3 g/dL (ref 12.0–15.0)
MCH: 31.7 pg (ref 26.0–34.0)
MCHC: 34.4 g/dL (ref 30.0–36.0)
MCV: 92.3 fL (ref 80.0–100.0)
Platelets: 320 K/uL (ref 150–400)
RBC: 3.88 MIL/uL (ref 3.87–5.11)
RDW: 13.6 % (ref 11.5–15.5)
WBC: 14.9 K/uL — ABNORMAL HIGH (ref 4.0–10.5)
nRBC: 0 % (ref 0.0–0.2)

## 2024-06-15 LAB — LIPASE, BLOOD: Lipase: 48 U/L (ref 11–51)

## 2024-06-15 LAB — COMPREHENSIVE METABOLIC PANEL WITH GFR
ALT: 20 U/L (ref 0–44)
AST: 27 U/L (ref 15–41)
Albumin: 3.3 g/dL — ABNORMAL LOW (ref 3.5–5.0)
Alkaline Phosphatase: 104 U/L (ref 38–126)
Anion gap: 10 (ref 5–15)
BUN: 33 mg/dL — ABNORMAL HIGH (ref 8–23)
CO2: 25 mmol/L (ref 22–32)
Calcium: 9.4 mg/dL (ref 8.9–10.3)
Chloride: 102 mmol/L (ref 98–111)
Creatinine, Ser: 1.44 mg/dL — ABNORMAL HIGH (ref 0.44–1.00)
GFR, Estimated: 34 mL/min — ABNORMAL LOW (ref >60)
Glucose, Bld: 83 mg/dL (ref 70–99)
Potassium: 5 mmol/L (ref 3.5–5.1)
Sodium: 137 mmol/L (ref 135–145)
Total Bilirubin: 1.1 mg/dL (ref 0.0–1.2)
Total Protein: 6.1 g/dL — ABNORMAL LOW (ref 6.5–8.1)

## 2024-06-15 LAB — CREATININE, SERUM
Creatinine, Ser: 1.59 mg/dL — ABNORMAL HIGH (ref 0.44–1.00)
GFR, Estimated: 30 mL/min — ABNORMAL LOW (ref >60)

## 2024-06-15 LAB — LACTIC ACID, PLASMA: Lactic Acid, Venous: 1 mmol/L (ref 0.5–1.9)

## 2024-06-15 MED ORDER — HEPARIN (PORCINE) 25000 UT/250ML-% IV SOLN
950.0000 [IU]/h | INTRAVENOUS | Status: AC
Start: 2024-06-15 — End: 2024-06-19
  Administered 2024-06-15: 600 [IU]/h via INTRAVENOUS
  Administered 2024-06-17: 750 [IU]/h via INTRAVENOUS
  Administered 2024-06-19: 1000 [IU]/h via INTRAVENOUS
  Filled 2024-06-15 (×3): qty 250

## 2024-06-15 MED ORDER — CHLORHEXIDINE GLUCONATE CLOTH 2 % EX PADS
6.0000 | MEDICATED_PAD | Freq: Every day | CUTANEOUS | Status: DC
Start: 2024-06-15 — End: 2024-06-20
  Administered 2024-06-16 – 2024-06-20 (×5): 6 via TOPICAL

## 2024-06-15 MED ORDER — METOPROLOL TARTRATE 5 MG/5ML IV SOLN: 5.0000 mg | Freq: Four times a day (QID) | INTRAVENOUS | Status: DC | PRN

## 2024-06-15 MED ORDER — FENTANYL CITRATE PF 50 MCG/ML IJ SOSY
25.0000 ug | PREFILLED_SYRINGE | Freq: Once | INTRAMUSCULAR | Status: AC
  Administered 2024-06-15: 25 ug via INTRAVENOUS
  Filled 2024-06-15: qty 1

## 2024-06-15 MED ORDER — HEPARIN SODIUM (PORCINE) 5000 UNIT/ML IJ SOLN
5000.0000 [IU] | Freq: Three times a day (TID) | INTRAMUSCULAR | Status: DC
Start: 2024-06-15 — End: 2024-06-15
  Administered 2024-06-15: 5000 [IU] via SUBCUTANEOUS
  Filled 2024-06-15: qty 1

## 2024-06-15 MED ORDER — TRAZODONE HCL 50 MG PO TABS: 25.0000 mg | ORAL_TABLET | Freq: Every evening | ORAL | Status: DC | PRN

## 2024-06-15 MED ORDER — OXYCODONE HCL 5 MG PO TABS
5.0000 mg | ORAL_TABLET | ORAL | Status: DC | PRN
  Administered 2024-06-18: 5 mg via ORAL
  Filled 2024-06-15: qty 1

## 2024-06-15 MED ORDER — ACETAMINOPHEN 650 MG RE SUPP: 650.0000 mg | Freq: Four times a day (QID) | RECTAL | Status: DC | PRN

## 2024-06-15 MED ORDER — ONDANSETRON HCL 4 MG/2ML IJ SOLN
4.0000 mg | Freq: Four times a day (QID) | INTRAMUSCULAR | Status: DC | PRN
  Administered 2024-06-18 – 2024-06-19 (×2): 4 mg via INTRAVENOUS
  Filled 2024-06-15 (×2): qty 2

## 2024-06-15 MED ORDER — ACETAMINOPHEN 325 MG PO TABS: 650.0000 mg | ORAL_TABLET | Freq: Four times a day (QID) | ORAL | Status: DC | PRN

## 2024-06-15 MED ORDER — HYDROMORPHONE HCL 1 MG/ML IJ SOLN: 0.5000 mg | INTRAMUSCULAR | Status: DC | PRN

## 2024-06-15 MED ORDER — LACTATED RINGERS IV SOLN: Freq: Once | INTRAVENOUS | Status: AC

## 2024-06-15 MED ORDER — ONDANSETRON HCL 4 MG PO TABS: 4.0000 mg | ORAL_TABLET | Freq: Four times a day (QID) | ORAL | Status: DC | PRN

## 2024-06-15 NOTE — Consult Note (Signed)
 Reason for Consult:abdominal pain Referring Physician: Dr. Elsie Montclair  Adrienne [name] is an 89 y.o. female.  HPI: This is a 89 year old female with multiple significant medical problems including A-fib on Eliquis , CHF, CAD, HTN, TIAs, and chronic UTIs from a chronic indwelling catheter who presented with abdominal pain this morning.  She reports that suddenly started after she woke up.  She tried to move her bowels but then had at least 6 episodes of emesis.  She has not had any further emesis since arrival several hours ago.  She currently denies nausea.  She describes the pain as sharp in her lower abdomen.  It has improved somewhat.  She denies chest pain or shortness of breath.  She is currently in a nursing facility.  Her previous abdominal surgery includes a cholecystectomy as well as an appendectomy many years ago.  Her daughter is present with her.  Past Medical History:  Diagnosis Date   Afib (HCC)    Arthritis of ankle    CAD (coronary artery disease)    Cancer (HCC) 2012   breast cancer-right   Compression fracture of T12 vertebra (HCC)    Congestive heart disease (HCC)    Diverticulosis    Edema    Elevated uric acid in blood    GERD (gastroesophageal reflux disease)    GI bleed 12/2015   Hyperlipidemia    Hypertension    Long-term (current) use of anticoagulants    Moderate mitral regurgitation    Personal history of radiation therapy    Pulmonary HTN (HCC)    Echo 1/19: EF 60-65, no RWMA, Ao sclerosis, trivial AI, mild MR, mod BAE, mod TR, PASP 49   Stroke (HCC) 01/25/2016   TIA (transient ischemic attack) 09/2011   Urinary incontinence    Urinary retention     Past Surgical History:  Procedure Laterality Date   APPENDECTOMY     AXILLARY SENTINEL NODE BIOPSY Left 01/22/2020   Procedure: Left Axillary Sentinel Node Biopsy;  Surgeon: Ebbie Cough, MD;  Location: [facility] OR;  Service: General;  Laterality: Left;   BREAST LUMPECTOMY WITH RADIOACTIVE  SEED AND SENTINEL LYMPH NODE BIOPSY Left 01/22/2020   Procedure: LEFT BREAST LUMPECTOMY WITH RADIOACTIVE SEED;  Surgeon: Ebbie Cough, MD;  Location: [facility] OR;  Service: General;  Laterality: Left;   BREAST SURGERY  2012   Rt.mastectomy--[place], [place]   CARDIAC CATHETERIZATION  2013   CATARACT EXTRACTION, BILATERAL     CERVICAL CONIZATION W/BX N/A 08/08/2020   Procedure: VAGINAL SIDEWALL BIOPSY;  Surgeon: Cleotilde Ronal RAMAN, MD;  Location: [facility] OR;  Service: Gynecology;  Laterality: N/A;   DILATION AND CURETTAGE OF UTERUS     in her early 58's for DUB   ESOPHAGOGASTRODUODENOSCOPY Left 01/08/2016   Procedure: ESOPHAGOGASTRODUODENOSCOPY (EGD);  Surgeon: Elsie Cree, MD;  Location: [facility];  Service: Endoscopy;  Laterality: Left;   GALLBLADDER SURGERY     MASTECTOMY Right 2012   RADIOLOGY WITH ANESTHESIA N/A 01/25/2016   Procedure: RADIOLOGY WITH ANESTHESIA;  Surgeon: Thyra Nash, MD;  Location: [facility] OR;  Service: Radiology;  Laterality: N/A;   REPLACEMENT TOTAL KNEE BILATERAL     TONSILLECTOMY AND ADENOIDECTOMY     as a child    Family History  Problem Relation Age of Onset   Asthma Mother    CVA Father    Atrial fibrillation Sister        HAS PACER   Pneumonia Brother    Cancer Daughter        COLON CANCER  Cancer Maternal Grandmother        breast cancer    Social History:  reports that she quit smoking about 75 years ago. Her smoking use included cigarettes. She started smoking about 77 years ago. She has a 0.5 pack-year smoking history. She has never used smokeless tobacco. She reports that she does not drink alcohol and does not use drugs.  Allergies:  Allergies  Allergen Reactions   Valium [Diazepam] Other (See Comments)    HYPERACTIVITY    Amiodarone Hcl [Amiodarone] Other (See Comments)    A-Fib   Compazine [Prochlorperazine] Other (See Comments)    HYPERACTIVITY    Other Other (See Comments)    Kidney dye--patient states this gave her severe  shakes/chills   Thorazine [Chlorpromazine] Other (See Comments)    HYPERACTIVITY    Zometa [Zoledronic Acid] Other (See Comments)    PROBLEMS WITH EYES  TIA   Ciprofloxacin  Swelling and Other (See Comments)    swelling in Rt.elbow   Doxycycline  Nausea Only   Zoster Vac Recomb Adjuvanted Other (See Comments)    Welt under injection site   Zoster Vaccine Recombinant, Adjuvanted Other (See Comments)    Other Reaction(s): wel under injection  varicella zoster virus glycoprotein E, recombinant   Iodinated Contrast Media Anxiety and Other (See Comments)    Chills and anxiety  Other Reaction(s): chills/shaking, Kidney dye    Medications: I have reviewed the patient's current medications.  Results for orders placed or performed during the hospital encounter of 06/15/24 (from the past 48 hours)  CBC with Differential     Status: Abnormal   Collection Time: 06/15/24  1:23 PM  Result Value Ref Range   WBC 10.5 4.0 - 10.5 K/uL   RBC 3.96 3.87 - 5.11 MIL/uL   Hemoglobin 12.4 12.0 - 15.0 g/dL   HCT 62.7 63.9 - 53.9 %   MCV 93.9 80.0 - 100.0 fL   MCH 31.3 26.0 - 34.0 pg   MCHC 33.3 30.0 - 36.0 g/dL   RDW 86.2 88.4 - 84.4 %   Platelets 302 150 - 400 K/uL   nRBC 0.0 0.0 - 0.2 %   Neutrophils Relative % 85 %   Neutro Abs 9.0 (H) 1.7 - 7.7 K/uL   Lymphocytes Relative 9 %   Lymphs Abs 0.9 0.7 - 4.0 K/uL   Monocytes Relative 6 %   Monocytes Absolute 0.6 0.1 - 1.0 K/uL   Eosinophils Relative 0 %   Eosinophils Absolute 0.0 0.0 - 0.5 K/uL   Basophils Relative 0 %   Basophils Absolute 0.0 0.0 - 0.1 K/uL   Immature Granulocytes 0 %   Abs Immature Granulocytes 0.02 0.00 - 0.07 K/uL    Comment: Performed at [facility], [address], [place], [place] [address]  Comprehensive metabolic panel     Status: Abnormal   Collection Time: 06/15/24  1:23 PM  Result Value Ref Range   Sodium 137 135 - 145 mmol/L   Potassium 5.0 3.5 - 5.1 mmol/L   Chloride 102 98 - 111 mmol/L    CO2 25 22 - 32 mmol/L   Glucose, Bld 83 70 - 99 mg/dL    Comment: Glucose reference range applies only to samples taken after fasting for at least 8 hours.   BUN 33 (H) 8 - 23 mg/dL   Creatinine, Ser 8.55 (H) 0.44 - 1.00 mg/dL   Calcium  9.4 8.9 - 10.3 mg/dL   Total Protein 6.1 (L) 6.5 - 8.1 g/dL  Albumin 3.3 (L) 3.5 - 5.0 g/dL   AST 27 15 - 41 U/L   ALT 20 0 - 44 U/L   Alkaline Phosphatase 104 38 - 126 U/L   Total Bilirubin 1.1 0.0 - 1.2 mg/dL   GFR, Estimated 34 (L) >60 mL/min    Comment: (NOTE) Calculated using the CKD-EPI Creatinine Equation (2021)    Anion gap 10 5 - 15    Comment: Performed at [facility], [address], [place], [place] [address]  Lipase, blood     Status: None   Collection Time: 06/15/24  1:23 PM  Result Value Ref Range   Lipase 48 11 - 51 U/L    Comment: Performed at [facility], [address], [place], [place] [address]  Urinalysis, Routine w reflex microscopic -Urine, Clean Catch     Status: Abnormal   Collection Time: 06/15/24  1:26 PM  Result Value Ref Range   Color, Urine YELLOW YELLOW   APPearance CLOUDY (A) CLEAR   Specific Gravity, Urine 1.014 1.005 - 1.030   pH 5.0 5.0 - 8.0   Glucose, UA NEGATIVE NEGATIVE mg/dL   Hgb urine dipstick MODERATE (A) NEGATIVE   Bilirubin Urine NEGATIVE NEGATIVE   Ketones, ur NEGATIVE NEGATIVE mg/dL   Protein, ur NEGATIVE NEGATIVE mg/dL   Nitrite NEGATIVE NEGATIVE   Leukocytes,Ua LARGE (A) NEGATIVE   RBC / HPF >50 0 - 5 RBC/hpf   WBC, UA >50 0 - 5 WBC/hpf   Bacteria, UA MANY (A) NONE SEEN   Squamous Epithelial / HPF 0-5 0 - 5 /HPF   WBC Clumps PRESENT    Mucus PRESENT    Hyaline Casts, UA PRESENT     Comment: Performed at [facility], [address] [place], [place] [address]    CT ABDOMEN PELVIS WO CONTRAST Addendum Date: 06/15/2024 ADDENDUM REPORT: 06/15/2024 15:13 ADDENDUM: These results were called by telephone at the time of  interpretation on 06/15/2024 at 3:13 pm to provider RILEY RANSOM , who verbally acknowledged these results. Electronically Signed   By: Maude Naegeli M.D.   On: 06/15/2024 15:13   Result Date: 06/15/2024 CLINICAL DATA:  Bowel obstruction suspected EXAM: CT ABDOMEN AND PELVIS WITHOUT CONTRAST TECHNIQUE: Multidetector CT imaging of the abdomen and pelvis was performed following the standard protocol without IV contrast. RADIATION DOSE REDUCTION: This exam was performed according to the departmental dose-optimization program which includes automated exposure control, adjustment of the mA and/or kV according to patient size and/or use of iterative reconstruction technique. COMPARISON:  CT of the abdomen and pelvis performed May 26, 2024 FINDINGS: Lower chest: No acute abnormality.  Heart is enlarged. Hepatobiliary: Small volume of perihepatic ascites. The gallbladder is surgically absent. Pancreas: Unchanged. Spleen: Unchanged. Adrenals/Urinary Tract: The adrenal glands are unchanged. The right kidney is within normal limits and there is no evidence of hydronephrosis. The left renal collecting system and left ureter are dilated, similar when compared to the previous examination. Similar small left renal calculus. An indwelling urinary catheter is present. The bladder is decompressed. Stomach/Bowel: The stomach is nondilated. The proximal duodenal is mildly dilated and fluid filled, similar when compared to May 26, 2024. There is increasing dilatation and inflammatory change involving the proximal jejunum in the left abdomen. An area of abnormally dilated small bowel with associated air-fluid levels is annotated. Just proximal to this dilatation there is significant inflammatory change and likely wall thickening. In the mid abdomen on image 42 of CT series 4,  there is swirling of the central mesentery as well as a loop of jejunum with a transition point at that site, likely resulting in obstruction (or  partial obstruction). The bowel distal to this segment is less nondilated. No evidence of free intraperitoneal air. Extensive colonic diverticulosis. A small rectal stool ball is present. Vascular/Lymphatic: No abdominal aortic aneurysm. Atherosclerotic changes are present in the abdominal aorta and major branch vessels. Reproductive: Uterus and bilateral adnexa are unremarkable. Other: Surgical clips are present in the right abdomen. Musculoskeletal: Degenerative changes in the imaged osseous structures. IMPRESSION: 1. Abnormal appearance of the mid small bowel with rotation of the mesentery resulting in obstruction or partial obstruction of mid jejunum and associated proximal bowel dilatation with significant inflammatory change. 2. Similar appearance of left-sided hydronephrosis and dilated ureter. Electronically Signed: By: Maude Naegeli M.D. On: 06/15/2024 14:58    Review of Systems  Constitutional:  Negative for chills and fever.  Respiratory:  Negative for shortness of breath.   Cardiovascular:  Negative for chest pain.  All other systems reviewed and are negative.  Blood pressure (!) 109/59, pulse 99, temperature (!) 97.5 F (36.4 C), temperature source Oral, resp. rate 20, last menstrual period 09/12/1973, SpO2 100%. Physical Exam Constitutional:      Appearance: She is not toxic-appearing or diaphoretic.     Comments: Elderly, frail-appearing but alert female  HENT:     Head: Normocephalic and atraumatic.  Cardiovascular:     Rate and Rhythm: Tachycardia present. Rhythm irregular.  Pulmonary:     Effort: Pulmonary effort is normal. No respiratory distress.  Abdominal:     Comments: Her abdomen is soft there is tenderness across the lower abdomen with some guarding.  There is no frank peritonitis.  There is small incisions consistent with a laparoscopic cholecystectomy  Skin:    General: Skin is warm and dry.  Neurological:     General: No focal deficit present.  Psychiatric:         Mood and Affect: Mood is not anxious.        Behavior: Behavior normal.     Assessment/Plan: Acute abdominal pain with small bowel inflammation and possible small bowel obstruction  I have reviewed the patient CT scan and discussed the findings with the patient and her daughter.  Given the swirling of the mesentery, this is worrisome for the potential developing internal hernia or closed-loop obstruction.  Clinically, she does not have frank peritonitis but is tender.  She took her last dose of Eliquis  last evening.  We discussed going to the operating room for an exploratory laparotomy versus supportive care and conservative management.  To go to the operating room we would have to reverse her anticoagulation and she would be at high risk from a cardiopulmonary standpoint for prolonged ventilation or cardiac issues.  Her condition could worsen without surgery or could improve with conservative management but it is difficult to tell.  Given her overall medical health, she and her daughter would like to try conservative management with IV rehydration and bowel rest.  Originally were going to place an NG tube but we will try to hold now that she develops emesis again.  A lactic acid is currently pending.  We will continue serial examinations unless she acutely decompensates and the decision was made to proceed to the operating room sooner.  Again they agree with the plans.  Complex medical decision making  Vicenta Poli 06/15/2024, 5:15 PM

## 2024-06-15 NOTE — ED Notes (Signed)
 [facility] called for pt transportation. Proper documentation for transportation ready

## 2024-06-15 NOTE — Progress Notes (Addendum)
 PHARMACY - ANTICOAGULATION CONSULT NOTE  Pharmacy Consult for heparin  Indication: hx atrial fibrillation (PTA Eliquis  on hold)  Allergies  Allergen Reactions   Valium [Diazepam] Other (See Comments)    HYPERACTIVITY    Amiodarone Hcl [Amiodarone] Other (See Comments)    A-Fib   Compazine [Prochlorperazine] Other (See Comments)    HYPERACTIVITY    Other Other (See Comments)    Kidney dye--patient states this gave her severe shakes/chills   Thorazine [Chlorpromazine] Other (See Comments)    HYPERACTIVITY    Zometa [Zoledronic Acid] Other (See Comments)    PROBLEMS WITH EYES  TIA   Ciprofloxacin  Swelling and Other (See Comments)    swelling in Rt.elbow   Doxycycline  Nausea Only   Zoster Vac Recomb Adjuvanted Other (See Comments)    Welt under injection site   Zoster Vaccine Recombinant, Adjuvanted Other (See Comments)    Other Reaction(s): wel under injection  varicella zoster virus glycoprotein E, recombinant   Iodinated Contrast Media Anxiety and Other (See Comments)    Chills and anxiety  Other Reaction(s): chills/shaking, Kidney dye    Patient Measurements:    Vital Signs: Temp: 98.7 F (37.1 C) (10/04 1805) Temp Source: Oral (10/04 1805) BP: 109/54 (10/04 1805) Pulse Rate: 102 (10/04 1805)  Labs: Recent Labs    06/15/24 1323  HGB 12.4  HCT 37.2  PLT 302  CREATININE 1.44*    Estimated Creatinine Clearance: 17.3 mL/min (A) (by C-G formula based on SCr of 1.44 mg/dL (H)).   Medical History: Past Medical History:  Diagnosis Date   Afib (HCC)    Arthritis of ankle    CAD (coronary artery disease)    Cancer (HCC) 2012   breast cancer-right   Compression fracture of T12 vertebra (HCC)    Congestive heart disease (HCC)    Diverticulosis    Edema    Elevated uric acid in blood    GERD (gastroesophageal reflux disease)    GI bleed 12/2015   Hyperlipidemia    Hypertension    Long-term (current) use of anticoagulants    Moderate mitral  regurgitation    Personal history of radiation therapy    Pulmonary HTN (HCC)    Echo 1/19: EF 60-65, no RWMA, Ao sclerosis, trivial AI, mild MR, mod BAE, mod TR, PASP 49   Stroke (HCC) 01/25/2016   TIA (transient ischemic attack) 09/2011   Urinary incontinence    Urinary retention     Medications:  - on Eliquis  2.5mg  bid PTA (last dose taken on 10/3 PM)  Assessment: Patient is 89 y.o F with hx afib on Eliquis  PTA who presented to the ED on 06/15/24 with c/o abdominal pain, n/v and constipation.  Abdominal CT showed small bowel inflammation and possible small bowel obstruction. Pharmacy has been consulted to dose heparin drip while pt's Eliquis  is placed on hold at this time.   Today, 06/15/2024: - cbc ok - scr 1.44 (crcl ~17) - hep 5000 units SQ given at 1630  Goal of Therapy:  Heparin level 0.3-0.7 units/ml aPTT 66-102 seconds Monitor platelets by anticoagulation protocol: Yes   Plan:  - heparin 600 units/hr - check 8 hr heparin level and aPTT - monitor for s/sx bleeding   Leul Narramore P 06/15/2024,6:06 PM

## 2024-06-15 NOTE — ED Triage Notes (Signed)
 Pt BIB ems for abdominal pain that started last night and got worse around 10am this morning along with nausea, vomiting, and constipation. Hx. Of Afib, CHF, CAD, Gout, Depression, HTN, TIA, and UTI.

## 2024-06-15 NOTE — Progress Notes (Signed)
 Received a hand-off report from [facility], Pt arrived in the unit via stretcher PTAR. Call bell within reached. Bed alarm on. With complaints of 5/10 abdominal pain but tolerable.

## 2024-06-15 NOTE — ED Provider Notes (Cosign Needed Addendum)
 [place] EMERGENCY DEPARTMENT AT [facility] Provider Note   CSN: [id number] Arrival date & time: 06/15/24  1122     Patient presents with: Abdominal Pain, Nausea, Emesis, and Constipation   Adrienne [name] is a 89 y.o. female with h/o Afib, CHF, CAD, Gout, Depression, HTN, TIA, and UTI with chronic indwelling urinary catheter presents to the ER today for evaluation of abdominal pain, nausea, vomiting starting this morning.  The patient reports that she is been having some lower abdominal pain.  Having 6 episodes of nausea vomiting without any bright red blood or coffee-ground emesis.  No fevers.  No chest pain or shortness of breath.  Reports she feels some better now.  She thinks that she may be constipated.  Did have a bowel movement yesterday.  She reports that she has a history of constipation for which is why she takes MiraLAX  however she only takes it every other day.  She reports that she has a leg bag and that her urine has been draining without difficulty or problem.  Denies any fever or chills.  Denies chest pain or shortness of breath.   Abdominal Pain Associated symptoms: constipation, nausea and vomiting   Associated symptoms: no chest pain, no chills, no diarrhea, no dysuria, no fever and no shortness of breath   Emesis Associated symptoms: abdominal pain   Associated symptoms: no chills, no diarrhea and no fever   Constipation Associated symptoms: abdominal pain, nausea and vomiting   Associated symptoms: no diarrhea, no dysuria and no fever        Prior to Admission medications   Medication Sig Start Date End Date Taking? Authorizing Provider  acetaminophen  (TYLENOL ) 500 MG tablet Take 1,000 mg by mouth every 12 (twelve) hours as needed for mild pain.    [provider]  albuterol  (VENTOLIN  HFA) 108 (90 Base) MCG/ACT inhaler Inhale 1 puff into the lungs every 6 (six) hours as needed for wheezing or shortness of breath (Cough). 09/08/22    Regalado, Belkys A, MD  allopurinol  (ZYLOPRIM ) 100 MG tablet Take 1 tablet (100 mg total) by mouth daily. 09/08/22   Regalado, Belkys A, MD  ascorbic acid  (VITAMIN C ) 500 MG tablet Take 500 mg by mouth 2 (two) times daily.    [provider]  atorvastatin  (LIPITOR) 10 MG tablet Take 1 tablet (10 mg total) by mouth daily. 08/14/23   Jeffrie Oneil BROCKS, MD  B Complex Vitamins (VITAMIN B COMPLEX ) CAPS Take 1 capsule by mouth daily.    [provider]  calcium  carbonate (OSCAL) 1500 (600 Ca) MG TABS tablet Take 1,500 mg by mouth 2 (two) times daily with a meal.    [provider]  cefTRIAXone  Sodium (ROCEPHIN  IJ) Inject as directed. Patient not taking: Reported on 05/30/2024    [provider]  cyanocobalamin  (VITAMIN B12) 1000 MCG tablet Take 1,000 mcg by mouth daily.    [provider]  docusate sodium  (COLACE) 100 MG capsule Take 100 mg by mouth daily as needed. 06/10/23   [provider]  ELIQUIS  2.5 MG TABS tablet TAKE 1 TABLET TWICE A DAY 03/08/24   Jeffrie Oneil BROCKS, MD  Ferrous Sulfate  (IRON ) 325 (65 Fe) MG TABS Take 325 mg by mouth daily. (OTC)    [provider]  furosemide  (LASIX ) 40 MG tablet Take 1 tablet (40 mg total) by mouth daily. 11/16/23 05/30/24  Jeffrie Oneil BROCKS, MD  meloxicam (MOBIC) 15 MG tablet Take 15 mg by mouth daily. Patient not  taking: Reported on 05/30/2024 09/14/23   [provider]  metoprolol  tartrate (LOPRESSOR ) 100 MG tablet Take 1 tablet (100 mg total) by mouth 2 (two) times daily. 08/14/23   Jeffrie Oneil BROCKS, MD  mineral oil-hydrophilic petrolatum (AQUAPHOR) ointment Apply topically as needed for dry skin. 05/30/24   Amundson C Silva, Brook E, MD  Multiple Vitamins-Minerals (MULTIVITAMIN WITH MINERALS) tablet Take 1 tablet by mouth daily.    [provider]  omeprazole (PRILOSEC) 10 MG capsule Take 10 mg by mouth every other day. Patient not taking: Reported on 05/30/2024 03/21/19   [provider]   OXYGEN  Inhale 2 L into the lungs.    [provider]  polyethylene glycol (MIRALAX  / GLYCOLAX ) 17 g packet Take 17 g by mouth at bedtime.    [provider]  potassium chloride  SA (KLOR-CON  M) 20 MEQ tablet Take 1 tablet (20 mEq total) by mouth daily. 06/17/23   Arrien, Mauricio Daniel, MD  Probiotic Product (UP4 PROBIOTICS WOMENS PO) Take 1 capsule by mouth daily with breakfast.    [provider]  pyridOXINE (B-6) 50 MG tablet 1 tablet Orally    [provider]    Allergies: Valium [diazepam]; Amiodarone hcl [amiodarone]; Compazine [prochlorperazine]; Other; Thorazine [chlorpromazine]; Zometa [zoledronic acid]; Ciprofloxacin ; Doxycycline ; Zoster vac recomb adjuvanted; Zoster vaccine recombinant, adjuvanted; and Iodinated contrast media    Review of Systems  Constitutional:  Negative for chills and fever.  Respiratory:  Negative for shortness of breath.   Cardiovascular:  Negative for chest pain.  Gastrointestinal:  Positive for abdominal pain, constipation, nausea and vomiting. Negative for anal bleeding, blood in stool and diarrhea.  Genitourinary:  Negative for dysuria.  Neurological:  Negative for syncope and light-headedness.    Updated Vital Signs BP (!) 109/59 (BP Location: Right Arm)   Pulse 99   Temp (!) 97.5 F (36.4 C) (Oral)   Resp 20   LMP 09/12/1973 (Approximate)   SpO2 100%   Physical Exam Vitals and nursing note reviewed.  Constitutional:      Appearance: She is not toxic-appearing.  Eyes:     General: No scleral icterus. Cardiovascular:     Rate and Rhythm: Normal rate.  Pulmonary:     Effort: Pulmonary effort is normal. No respiratory distress.  Abdominal:     General: There is no distension.     Palpations: Abdomen is soft.     Tenderness: There is generalized abdominal tenderness.     Comments: Diffuse abdominal tenderness to palpation, but mainly in the lower abdomen. Soft. No guarding or rebound. NBS.   Skin:     General: Skin is warm and dry.  Neurological:     Mental Status: She is alert.     (all labs ordered are listed, but only abnormal results are displayed) Labs Reviewed  CBC WITH DIFFERENTIAL/PLATELET - Abnormal; Notable for the following components:      Result Value   Neutro Abs 9.0 (*)    All other components within normal limits  COMPREHENSIVE METABOLIC PANEL WITH GFR - Abnormal; Notable for the following components:   BUN 33 (*)    Creatinine, Ser 1.44 (*)    Total Protein 6.1 (*)    Albumin 3.3 (*)    GFR, Estimated 34 (*)    All other components within normal limits  URINALYSIS, ROUTINE W REFLEX MICROSCOPIC - Abnormal; Notable for the following components:   APPearance CLOUDY (*)    Hgb urine dipstick MODERATE (*)    Leukocytes,Ua LARGE (*)  Bacteria, UA MANY (*)    All other components within normal limits  LIPASE, BLOOD  CBC  CREATININE, SERUM    EKG: None  Radiology: CT ABDOMEN PELVIS WO CONTRAST Addendum Date: 06/15/2024 ADDENDUM REPORT: 06/15/2024 15:13 ADDENDUM: These results were called by telephone at the time of interpretation on 06/15/2024 at 3:13 pm to provider Sydelle Sherfield , who verbally acknowledged these results. Electronically Signed   By: Maude Naegeli M.D.   On: 06/15/2024 15:13   Result Date: 06/15/2024 CLINICAL DATA:  Bowel obstruction suspected EXAM: CT ABDOMEN AND PELVIS WITHOUT CONTRAST TECHNIQUE: Multidetector CT imaging of the abdomen and pelvis was performed following the standard protocol without IV contrast. RADIATION DOSE REDUCTION: This exam was performed according to the departmental dose-optimization program which includes automated exposure control, adjustment of the mA and/or kV according to patient size and/or use of iterative reconstruction technique. COMPARISON:  CT of the abdomen and pelvis performed May 26, 2024 FINDINGS: Lower chest: No acute abnormality.  Heart is enlarged. Hepatobiliary: Small volume of perihepatic ascites. The  gallbladder is surgically absent. Pancreas: Unchanged. Spleen: Unchanged. Adrenals/Urinary Tract: The adrenal glands are unchanged. The right kidney is within normal limits and there is no evidence of hydronephrosis. The left renal collecting system and left ureter are dilated, similar when compared to the previous examination. Similar small left renal calculus. An indwelling urinary catheter is present. The bladder is decompressed. Stomach/Bowel: The stomach is nondilated. The proximal duodenal is mildly dilated and fluid filled, similar when compared to May 26, 2024. There is increasing dilatation and inflammatory change involving the proximal jejunum in the left abdomen. An area of abnormally dilated small bowel with associated air-fluid levels is annotated. Just proximal to this dilatation there is significant inflammatory change and likely wall thickening. In the mid abdomen on image 42 of CT series 4, there is swirling of the central mesentery as well as a loop of jejunum with a transition point at that site, likely resulting in obstruction (or partial obstruction). The bowel distal to this segment is less nondilated. No evidence of free intraperitoneal air. Extensive colonic diverticulosis. A small rectal stool ball is present. Vascular/Lymphatic: No abdominal aortic aneurysm. Atherosclerotic changes are present in the abdominal aorta and major branch vessels. Reproductive: Uterus and bilateral adnexa are unremarkable. Other: Surgical clips are present in the right abdomen. Musculoskeletal: Degenerative changes in the imaged osseous structures. IMPRESSION: 1. Abnormal appearance of the mid small bowel with rotation of the mesentery resulting in obstruction or partial obstruction of mid jejunum and associated proximal bowel dilatation with significant inflammatory change. 2. Similar appearance of left-sided hydronephrosis and dilated ureter. Electronically Signed: By: Maude Naegeli M.D. On: 06/15/2024  14:58    Clinical Course as of 06/15/24 1723  Sat Jun 15, 2024  1511 Patient sleeping. Denies nausea at this time. Is having some pain. Will order [RR]  1526 Spoke with Dr. Althia [RR]  1532 Called daughter, mailbox is full [RR]    Clinical Course User Index [RR] Bernis Ernst, PA-C     Procedures   Medications Ordered in the ED  heparin injection 5,000 Units (has no administration in time range)  fentaNYL  (SUBLIMAZE ) injection 25 mcg (25 mcg Intravenous Given 06/15/24 1527)                                Medical Decision Making Amount and/or Complexity of Data Reviewed Labs: ordered. Radiology: ordered.  Risk Prescription  drug management. Decision regarding hospitalization.   89 y.o. female presents to the ER for evaluation of abdominal pain, nausea, vomiting. Differential diagnosis includes but is not limited to AAA, mesenteric ischemia, appendicitis, diverticulitis, DKA, gastroenteritis, nephrolithiasis, pancreatitis, constipation, UTI, bowel obstruction, biliary disease, IBD, PUD, hepatitis. Vital signs BP 118/98, temp 97.28F otherwise unremarkable. Physical exam as noted above.   On previous chart evaluation, patient has similarsuprapubic pain on 05-26-2024 and 05-14-2024.  Was also see on 04-03-2024 as well.  Will obtain bladder scan to determine if the catheter is draining accurately. CT without contrast and labs ordered as well.   Bladder scan negative. Urinary catheter is working.   I independently reviewed and interpreted the patient's labs.  CBC shows some triple count slightly elevated at 9.0 but no leukocytosis or anemia.  CMP shows a BUN of 33, creatinine of 1.44.  Mildly decreased protein and albumin.  Lipase normal limits.  Urinalysis is a cloudy urine with moderate mount hemoglobin present.  Large leukocytes greater than 50 red blood cells and white blood cells with many bacteria present.  White blood cells clumps present as well.   CT Abd Pelvis 1. Abnormal  appearance of the mid small bowel with rotation of the mesentery resulting in obstruction or partial obstruction of mid jejunum and associated proximal bowel dilatation with significant inflammatory change. 2. Similar appearance of left-sided hydronephrosis and dilated ureter. Per radiologist's interpretation.    I consulted general surgery and spoke with Dr. Althia. Plan is for NG insertion and hospital to admit. She still is not having any nausea/vomiting.   Called and spoke with daughter, who is now at bedside, who is aware of the plan. Patient and daughter agreeable for admission.  Patient likely experiencing pain from obstruction, may be chronically colonized due to her indwelling urinary catheter.  Will leave antibiotic choice, if needed, to hospitalist.  [facility] to admit.   Portions of this report may have been transcribed using voice recognition software. Every effort was made to ensure accuracy; however, inadvertent computerized transcription errors may be present.    Final diagnoses:  Small bowel obstruction [facility])    ED Discharge Orders     None          Bernis Ernst, PA-C 06/15/24 1547    Bernis Ernst, PA-C 06/15/24 1723    Simon Lavonia SAILOR, MD 06/16/24 [phone]

## 2024-06-15 NOTE — H&P (Addendum)
 History and Physical    Adrienne [name] FMW:[id number] DOB: 1928-06-09 DOA: 06/15/2024  PCP: Cleotilde Planas, MD   Chief Complaint: Abdominal pain  HPI: Adrienne [name] is a 89 y.o. female with medical history significant of A-fib on Eliquis , last dose 10/3 evening, heart failure with preserved ejection fraction, chronic urinary retention with chronic Foley most recently replaced on 9/30 at urology office, history of CVA, CKD 3B hypertension, hyperlipidemia, hypokalemia in the setting of diuresis, nocturnal hypoxia on 2 L nasal cannula, prior GI bleed, remote breast cancer history status post treatment, and GERD.  Patient presents to our facility with acute onset and worsening abdominal pain having started just this morning.  Initial imaging concerning for small bowel obstruction.  Hospitalist called for admission and general surgery(Dr. Vernetta) called in consult.  ED Course: Uncomplicated, pain controlled with low-dose fentanyl , nausea controlled with Zofran .  Review of Systems: As per HPI as above otherwise unremarkable denies chest pain shortness of breath headache fevers chills sick contacts or recent travel..   Assessment/Plan Active Problems:   SBO (small bowel obstruction) (HCC)   Acute onset intractable abdominal pain secondary to presumed small bowel obstruction -Imaging concerning for small bowel obstruction given patient's acute onset symptoms -General Surgery following, appreciate insight recommendations, current plan is for medical management in hopes that patient's symptoms are able to resolve on their own.  Patient would not be a low risk candidate for procedure given her cardiac and stroke history as well as age. -Continue IV fluids, avoid volume overload given heart failure as below -Patient's pain controlled poorly on fentanyl  will transition to low-dose Dilaudid IV 0.5 mg every 3 hours for severe pain - N.p.o. - Initial plan for NG tube placement on hold as  patient's nausea and vomiting appear to be well-controlled at this time, low threshold to insert NG tube to intermittent suction however if symptoms worsen  CKD 3, near baseline creatinine 1.2 - Continue to follow, given acute onset of symptoms patient is not volume depleted or dehydrated -IV fluids ongoing as above given n.p.o. status to avoid volume overload  A-fib on Eliquis  - Transition to heparin drip given possible need for procedure as above, last dose of Eliquis  10/3 PM - Resume rate control medications metoprolol  once more appropriate(currently borderline hypotensive without tachycardia)  Chronic nocturnal hypoxic respiratory failure  - Continue 2 L nasal cannula at night per home regimen  Moderate to severe protein caloric malnutrition - Appreciate nutrition follow-up and intervention There is no height or weight on file to calculate BMI.  Heart failure with preserved ejection fraction  Hypertension Hyperlipidemia CVA history - Currently holding core measures including metoprolol /furosemide /potassium - IV fluids cautiously as above given n.p.o. status -No indication to repeat echo, previous with EF 60 to 65% - Hold atorvastatin   DVT prophylaxis: Heparin drip Code Status: DNR -confirmed with daughter at bedside, patient previously had signed goldenrod/DNR in our system per daughter Family Communication: Daughter at bedside Status is: Inpatient  Dispo: The patient is from: Home              Anticipated d/c is to: Home              Anticipated d/c date is: 48 to 72 hours              Patient currently not medically stable for discharge given ongoing abdominal pain small bowel obstruction needing close monitoring overnight, IV fluids and potential intervention with general surgery  Consultants:  General Surgery  Procedures:  None planned   Past Medical History:  Diagnosis Date   Afib (HCC)    Arthritis of ankle    CAD (coronary artery disease)    Cancer (HCC)  2012   breast cancer-right   Compression fracture of T12 vertebra (HCC)    Congestive heart disease (HCC)    Diverticulosis    Edema    Elevated uric acid in blood    GERD (gastroesophageal reflux disease)    GI bleed 12/2015   Hyperlipidemia    Hypertension    Long-term (current) use of anticoagulants    Moderate mitral regurgitation    Personal history of radiation therapy    Pulmonary HTN (HCC)    Echo 1/19: EF 60-65, no RWMA, Ao sclerosis, trivial AI, mild MR, mod BAE, mod TR, PASP 49   Stroke (HCC) 01/25/2016   TIA (transient ischemic attack) 09/2011   Urinary incontinence    Urinary retention     Past Surgical History:  Procedure Laterality Date   APPENDECTOMY     AXILLARY SENTINEL NODE BIOPSY Left 01/22/2020   Procedure: Left Axillary Sentinel Node Biopsy;  Surgeon: Ebbie Cough, MD;  Location: [facility] OR;  Service: General;  Laterality: Left;   BREAST LUMPECTOMY WITH RADIOACTIVE SEED AND SENTINEL LYMPH NODE BIOPSY Left 01/22/2020   Procedure: LEFT BREAST LUMPECTOMY WITH RADIOACTIVE SEED;  Surgeon: Ebbie Cough, MD;  Location: [facility] OR;  Service: General;  Laterality: Left;   BREAST SURGERY  2012   Rt.mastectomy--[place], [place]   CARDIAC CATHETERIZATION  2013   CATARACT EXTRACTION, BILATERAL     CERVICAL CONIZATION W/BX N/A 08/08/2020   Procedure: VAGINAL SIDEWALL BIOPSY;  Surgeon: Cleotilde Ronal RAMAN, MD;  Location: [facility] OR;  Service: Gynecology;  Laterality: N/A;   DILATION AND CURETTAGE OF UTERUS     in her early 21's for DUB   ESOPHAGOGASTRODUODENOSCOPY Left 01/08/2016   Procedure: ESOPHAGOGASTRODUODENOSCOPY (EGD);  Surgeon: Elsie Cree, MD;  Location: [facility];  Service: Endoscopy;  Laterality: Left;   GALLBLADDER SURGERY     MASTECTOMY Right 2012   RADIOLOGY WITH ANESTHESIA N/A 01/25/2016   Procedure: RADIOLOGY WITH ANESTHESIA;  Surgeon: Thyra Nash, MD;  Location: [facility] OR;  Service: Radiology;  Laterality: N/A;   REPLACEMENT TOTAL KNEE BILATERAL      TONSILLECTOMY AND ADENOIDECTOMY     as a child     reports that she quit smoking about 75 years ago. Her smoking use included cigarettes. She started smoking about 77 years ago. She has a 0.5 pack-year smoking history. She has never used smokeless tobacco. She reports that she does not drink alcohol and does not use drugs.  Allergies  Allergen Reactions   Valium [Diazepam] Other (See Comments)    HYPERACTIVITY    Amiodarone Hcl [Amiodarone] Other (See Comments)    A-Fib   Compazine [Prochlorperazine] Other (See Comments)    HYPERACTIVITY    Other Other (See Comments)    Kidney dye--patient states this gave her severe shakes/chills   Thorazine [Chlorpromazine] Other (See Comments)    HYPERACTIVITY    Zometa [Zoledronic Acid] Other (See Comments)    PROBLEMS WITH EYES  TIA   Ciprofloxacin  Swelling and Other (See Comments)    swelling in Rt.elbow   Doxycycline  Nausea Only   Zoster Vac Recomb Adjuvanted Other (See Comments)    Welt under injection site   Zoster Vaccine Recombinant, Adjuvanted Other (See Comments)    Other Reaction(s): wel under injection  varicella zoster virus glycoprotein E, recombinant   Iodinated Contrast  Media Anxiety and Other (See Comments)    Chills and anxiety  Other Reaction(s): chills/shaking, Kidney dye    Family History  Problem Relation Age of Onset   Asthma Mother    CVA Father    Atrial fibrillation Sister        HAS PACER   Pneumonia Brother    Cancer Daughter        COLON CANCER   Cancer Maternal Grandmother        breast cancer    Prior to Admission medications   Medication Sig Start Date End Date Taking? Authorizing Provider  acetaminophen  (TYLENOL ) 500 MG tablet Take 1,000 mg by mouth every 12 (twelve) hours as needed for mild pain.    [provider]  albuterol  (VENTOLIN  HFA) 108 (90 Base) MCG/ACT inhaler Inhale 1 puff into the lungs every 6 (six) hours as needed for wheezing or shortness of breath (Cough). 09/08/22    Regalado, Belkys A, MD  allopurinol  (ZYLOPRIM ) 100 MG tablet Take 1 tablet (100 mg total) by mouth daily. 09/08/22   Regalado, Belkys A, MD  ascorbic acid  (VITAMIN C ) 500 MG tablet Take 500 mg by mouth 2 (two) times daily.    [provider]  atorvastatin  (LIPITOR) 10 MG tablet Take 1 tablet (10 mg total) by mouth daily. 08/14/23   Jeffrie Oneil BROCKS, MD  B Complex Vitamins (VITAMIN B COMPLEX ) CAPS Take 1 capsule by mouth daily.    [provider]  calcium  carbonate (OSCAL) 1500 (600 Ca) MG TABS tablet Take 1,500 mg by mouth 2 (two) times daily with a meal.    [provider]  cefTRIAXone  Sodium (ROCEPHIN  IJ) Inject as directed. Patient not taking: Reported on 05/30/2024    [provider]  cyanocobalamin  (VITAMIN B12) 1000 MCG tablet Take 1,000 mcg by mouth daily.    [provider]  docusate sodium  (COLACE) 100 MG capsule Take 100 mg by mouth daily as needed. 06/10/23   [provider]  ELIQUIS  2.5 MG TABS tablet TAKE 1 TABLET TWICE A DAY 03/08/24   Jeffrie Oneil BROCKS, MD  Ferrous Sulfate  (IRON ) 325 (65 Fe) MG TABS Take 325 mg by mouth daily. (OTC)    [provider]  furosemide  (LASIX ) 40 MG tablet Take 1 tablet (40 mg total) by mouth daily. 11/16/23 05/30/24  Jeffrie Oneil BROCKS, MD  meloxicam (MOBIC) 15 MG tablet Take 15 mg by mouth daily. Patient not taking: Reported on 05/30/2024 09/14/23   [provider]  metoprolol  tartrate (LOPRESSOR ) 100 MG tablet Take 1 tablet (100 mg total) by mouth 2 (two) times daily. 08/14/23   Jeffrie Oneil BROCKS, MD  mineral oil-hydrophilic petrolatum (AQUAPHOR) ointment Apply topically as needed for dry skin. 05/30/24   Amundson C Silva, Brook E, MD  Multiple Vitamins-Minerals (MULTIVITAMIN WITH MINERALS) tablet Take 1 tablet by mouth daily.    [provider]  omeprazole (PRILOSEC) 10 MG capsule Take 10 mg by mouth every other day. Patient not taking: Reported on 05/30/2024 03/21/19   [provider]   OXYGEN  Inhale 2 L into the lungs.    [provider]  polyethylene glycol (MIRALAX  / GLYCOLAX ) 17 g packet Take 17 g by mouth at bedtime.    [provider]  potassium chloride  SA (KLOR-CON  M) 20 MEQ tablet Take 1 tablet (20 mEq total) by mouth daily. 06/17/23   Arrien, Mauricio Daniel, MD  Probiotic Product (UP4 PROBIOTICS WOMENS PO) Take 1 capsule by mouth daily with breakfast.  [provider]  pyridOXINE (B-6) 50 MG tablet 1 tablet Orally    [provider]    Physical Exam: Vitals:   06/15/24 1134 06/15/24 1150 06/15/24 1154 06/15/24 1529  BP: (!) 118/98   (!) 109/59  Pulse: 65   99  Resp: 20   20  Temp: (!) 97.5 F (36.4 C) (!) 97.5 F (36.4 C)  (!) 97.5 F (36.4 C)  TempSrc: Oral Rectal  Oral  SpO2: 93%  97% 100%    Constitutional: NAD, calm, comfortable Vitals:   06/15/24 1134 06/15/24 1150 06/15/24 1154 06/15/24 1529  BP: (!) 118/98   (!) 109/59  Pulse: 65   99  Resp: 20   20  Temp: (!) 97.5 F (36.4 C) (!) 97.5 F (36.4 C)  (!) 97.5 F (36.4 C)  TempSrc: Oral Rectal  Oral  SpO2: 93%  97% 100%   General:  Pleasantly resting in bed, No acute distress.  Somewhat cachectic in appearance HEENT:  Normocephalic atraumatic.  Sclerae nonicteric, noninjected.  Extraocular movements intact bilaterally. Neck:  Without mass or deformity.  Trachea is midline. Lungs:  Clear to auscultate bilaterally without rhonchi, wheeze, or rales. Heart: Irregularly irregular. Abdomen:  Soft, markedly tender to right epigastrium Extremities: Without cyanosis, clubbing, edema Skin:  Warm and dry, no erythema.  Labs on Admission: I have personally reviewed following labs and imaging studies  CBC: Recent Labs  Lab 06/15/24 1323  WBC 10.5  NEUTROABS 9.0*  HGB 12.4  HCT 37.2  MCV 93.9  PLT 302   Basic Metabolic Panel: Recent Labs  Lab 06/15/24 1323  NA 137  K 5.0  CL 102  CO2 25  GLUCOSE 83  BUN 33*  CREATININE 1.44*  CALCIUM  9.4    GFR: Estimated Creatinine Clearance: 17.3 mL/min (A) (by C-G formula based on SCr of 1.44 mg/dL (H)).  Liver Function Tests: Recent Labs  Lab 06/15/24 1323  AST 27  ALT 20  ALKPHOS 104  BILITOT 1.1  PROT 6.1*  ALBUMIN 3.3*   Recent Labs  Lab 06/15/24 1323  LIPASE 48   Urine analysis:    Component Value Date/Time   COLORURINE YELLOW 06/15/2024 1326   APPEARANCEUR CLOUDY (A) 06/15/2024 1326   LABSPEC 1.014 06/15/2024 1326   PHURINE 5.0 06/15/2024 1326   GLUCOSEU NEGATIVE 06/15/2024 1326   HGBUR MODERATE (A) 06/15/2024 1326   BILIRUBINUR NEGATIVE 06/15/2024 1326   BILIRUBINUR n 04/14/2020 1705   KETONESUR NEGATIVE 06/15/2024 1326   PROTEINUR NEGATIVE 06/15/2024 1326   UROBILINOGEN 0.2 04/14/2020 1705   NITRITE NEGATIVE 06/15/2024 1326   LEUKOCYTESUR LARGE (A) 06/15/2024 1326    Radiological Exams on Admission: CT ABDOMEN PELVIS WO CONTRAST Addendum Date: 06/15/2024 ADDENDUM REPORT: 06/15/2024 15:13 ADDENDUM: These results were called by telephone at the time of interpretation on 06/15/2024 at 3:13 pm to provider RILEY RANSOM , who verbally acknowledged these results. Electronically Signed   By: Maude Naegeli M.D.   On: 06/15/2024 15:13   Result Date: 06/15/2024 CLINICAL DATA:  Bowel obstruction suspected EXAM: CT ABDOMEN AND PELVIS WITHOUT CONTRAST TECHNIQUE: Multidetector CT imaging of the abdomen and pelvis was performed following the standard protocol without IV contrast. RADIATION DOSE REDUCTION: This exam was performed according to the departmental dose-optimization program which includes automated exposure control, adjustment of the mA and/or kV according to patient size and/or use of iterative reconstruction technique. COMPARISON:  CT of the abdomen and pelvis performed May 26, 2024 FINDINGS: Lower chest: No acute abnormality.  Heart is  enlarged. Hepatobiliary: Small volume of perihepatic ascites. The gallbladder is surgically absent. Pancreas: Unchanged. Spleen:  Unchanged. Adrenals/Urinary Tract: The adrenal glands are unchanged. The right kidney is within normal limits and there is no evidence of hydronephrosis. The left renal collecting system and left ureter are dilated, similar when compared to the previous examination. Similar small left renal calculus. An indwelling urinary catheter is present. The bladder is decompressed. Stomach/Bowel: The stomach is nondilated. The proximal duodenal is mildly dilated and fluid filled, similar when compared to May 26, 2024. There is increasing dilatation and inflammatory change involving the proximal jejunum in the left abdomen. An area of abnormally dilated small bowel with associated air-fluid levels is annotated. Just proximal to this dilatation there is significant inflammatory change and likely wall thickening. In the mid abdomen on image 42 of CT series 4, there is swirling of the central mesentery as well as a loop of jejunum with a transition point at that site, likely resulting in obstruction (or partial obstruction). The bowel distal to this segment is less nondilated. No evidence of free intraperitoneal air. Extensive colonic diverticulosis. A small rectal stool ball is present. Vascular/Lymphatic: No abdominal aortic aneurysm. Atherosclerotic changes are present in the abdominal aorta and major branch vessels. Reproductive: Uterus and bilateral adnexa are unremarkable. Other: Surgical clips are present in the right abdomen. Musculoskeletal: Degenerative changes in the imaged osseous structures. IMPRESSION: 1. Abnormal appearance of the mid small bowel with rotation of the mesentery resulting in obstruction or partial obstruction of mid jejunum and associated proximal bowel dilatation with significant inflammatory change. 2. Similar appearance of left-sided hydronephrosis and dilated ureter. Electronically Signed: By: Maude Naegeli M.D. On: 06/15/2024 14:58    EKG: Independently reviewed.  A-fib, rate  controlled   Elsie JAYSON Montclair DO [facility] For contact please use secure messenger on Epic  If 7PM-7AM, please contact night-coverage located on [email or web address]   06/15/2024, 3:41 PM

## 2024-06-15 NOTE — ED Notes (Signed)
 Report called [id number] floor RN Jeryl

## 2024-06-16 ENCOUNTER — Inpatient Hospital Stay (HOSPITAL_COMMUNITY)

## 2024-06-16 DIAGNOSIS — K56609 Unspecified intestinal obstruction, unspecified as to partial versus complete obstruction: Secondary | ICD-10-CM | POA: Diagnosis not present

## 2024-06-16 LAB — CBC
HCT: 34.1 % — ABNORMAL LOW (ref 36.0–46.0)
Hemoglobin: 11.7 g/dL — ABNORMAL LOW (ref 12.0–15.0)
MCH: 31.1 pg (ref 26.0–34.0)
MCHC: 34.3 g/dL (ref 30.0–36.0)
MCV: 90.7 fL (ref 80.0–100.0)
Platelets: 300 K/uL (ref 150–400)
RBC: 3.76 MIL/uL — ABNORMAL LOW (ref 3.87–5.11)
RDW: 13.8 % (ref 11.5–15.5)
WBC: 14.3 K/uL — ABNORMAL HIGH (ref 4.0–10.5)
nRBC: 0 % (ref 0.0–0.2)

## 2024-06-16 LAB — HEPARIN LEVEL (UNFRACTIONATED)
Heparin Unfractionated: >1.1 [IU]/mL — ABNORMAL HIGH (ref 0.30–0.70)
Heparin Unfractionated: >1.1 [IU]/mL — ABNORMAL HIGH (ref 0.30–0.70)

## 2024-06-16 LAB — LACTIC ACID, PLASMA
Lactic Acid, Venous: 1.4 mmol/L (ref 0.5–1.9)
Lactic Acid, Venous: 1.5 mmol/L (ref 0.5–1.9)

## 2024-06-16 LAB — APTT
aPTT: 63 s — ABNORMAL HIGH (ref 24–36)
aPTT: 62 s — ABNORMAL HIGH (ref 24–36)

## 2024-06-16 MED ORDER — SODIUM CHLORIDE 0.9 % IV BOLUS
1000.0000 mL | Freq: Once | INTRAVENOUS | Status: AC
  Administered 2024-06-16: 1000 mL via INTRAVENOUS

## 2024-06-16 NOTE — Progress Notes (Signed)
   06/16/24 1140  Vitals  BP (!) 111/57  BP Location Left Arm  BP Method Automatic  Patient Position (if appropriate) Lying  Pulse Rate 61  Resp 18  Level of Consciousness  Level of Consciousness Alert  MEWS COLOR  MEWS Score Color Green  Oxygen  Therapy  SpO2 94 %  O2 Device Room Air  Pain Assessment  Pain Scale 0-10  Pain Score 0  MEWS Score  MEWS Temp 0  MEWS Systolic 0  MEWS Pulse 0  MEWS RR 0  MEWS LOC 0  MEWS Score 0

## 2024-06-16 NOTE — Progress Notes (Signed)
 Assisted pt to restroom

## 2024-06-16 NOTE — Progress Notes (Signed)
 PHARMACY - ANTICOAGULATION CONSULT NOTE  Pharmacy Consult for heparin  Indication: hx atrial fibrillation (PTA Eliquis  on hold)  Allergies  Allergen Reactions   Valium [Diazepam] Other (See Comments)    HYPERACTIVITY    Amiodarone Hcl [Amiodarone] Other (See Comments)    A-Fib   Compazine [Prochlorperazine] Other (See Comments)    HYPERACTIVITY    Other Other (See Comments)    Kidney dye--patient states this gave her severe shakes/chills   Thorazine [Chlorpromazine] Other (See Comments)    HYPERACTIVITY    Zometa [Zoledronic Acid] Other (See Comments)    PROBLEMS WITH EYES  TIA   Ciprofloxacin  Swelling and Other (See Comments)    swelling in Rt.elbow   Doxycycline  Nausea Only   Zoster Vac Recomb Adjuvanted Other (See Comments)    Welt under injection site   Zoster Vaccine Recombinant, Adjuvanted Other (See Comments)    Other Reaction(s): wel under injection  varicella zoster virus glycoprotein E, recombinant   Iodinated Contrast Media Anxiety and Other (See Comments)    Chills and anxiety  Other Reaction(s): chills/shaking, Kidney dye    Patient Measurements:    Vital Signs: Temp: 99.3 F (37.4 C) (10/04 1921) Temp Source: Oral (10/04 1921) BP: 112/64 (10/04 1921) Pulse Rate: 105 (10/04 1921)  Labs: Recent Labs    06/15/24 1323 06/15/24 1924 06/16/24 0406  HGB 12.4 12.3 11.7*  HCT 37.2 35.8* 34.1*  PLT 302 320 300  APTT  --   --  63*  HEPARINUNFRC  --   --  >1.10*  CREATININE 1.44* 1.59*  --     Estimated Creatinine Clearance: 15.6 mL/min (A) (by C-G formula based on SCr of 1.59 mg/dL (H)).   Medical History: Past Medical History:  Diagnosis Date   Afib (HCC)    Arthritis of ankle    CAD (coronary artery disease)    Cancer (HCC) 2012   breast cancer-right   Compression fracture of T12 vertebra (HCC)    Congestive heart disease (HCC)    Diverticulosis    Edema    Elevated uric acid in blood    GERD (gastroesophageal reflux disease)     GI bleed 12/2015   Hyperlipidemia    Hypertension    Long-term (current) use of anticoagulants    Moderate mitral regurgitation    Personal history of radiation therapy    Pulmonary HTN (HCC)    Echo 1/19: EF 60-65, no RWMA, Ao sclerosis, trivial AI, mild MR, mod BAE, mod TR, PASP 49   Stroke (HCC) 01/25/2016   TIA (transient ischemic attack) 09/2011   Urinary incontinence    Urinary retention     Medications:  - on Eliquis  2.5mg  bid PTA (last dose taken on 10/3 PM)  Assessment: Patient is 89 y.o F with hx afib on Eliquis  PTA who presented to the ED on 06/15/24 with c/o abdominal pain, n/v and constipation.  Abdominal CT showed small bowel inflammation and possible small bowel obstruction. Pharmacy has been consulted to dose heparin drip while pt's Eliquis  is placed on hold at this time.   10/5 AM update:  aPTT sub-therapeutic   Goal of Therapy:  Heparin level 0.3-0.7 units/ml aPTT 66-102 seconds Monitor platelets by anticoagulation protocol: Yes   Plan:  -Inc heparin to 650 units/hr -Heparin level and aPTT in 8 hours  Lynwood Mckusick, PharmD, BCPS Clinical Pharmacist Phone: [phone]

## 2024-06-16 NOTE — Progress Notes (Signed)
 Placed order for IV team to place second IV for bolus. Heparin running in current IV and cannot be stopped

## 2024-06-16 NOTE — Progress Notes (Signed)
 PHARMACY - ANTICOAGULATION CONSULT NOTE  Pharmacy Consult for heparin  Indication: hx atrial fibrillation (PTA Eliquis  on hold)  Allergies  Allergen Reactions   Valium [Diazepam] Other (See Comments)    HYPERACTIVITY    Amiodarone Hcl [Amiodarone] Other (See Comments)    A-Fib   Compazine [Prochlorperazine] Other (See Comments)    HYPERACTIVITY    Other Other (See Comments)    Kidney dye--patient states this gave her severe shakes/chills   Thorazine [Chlorpromazine] Other (See Comments)    HYPERACTIVITY    Zometa [Zoledronic Acid] Other (See Comments)    PROBLEMS WITH EYES  TIA   Ciprofloxacin  Swelling and Other (See Comments)    swelling in Rt.elbow   Doxycycline  Nausea Only   Zoster Vac Recomb Adjuvanted Other (See Comments)    Welt under injection site   Zoster Vaccine Recombinant, Adjuvanted Other (See Comments)    Other Reaction(s): wel under injection  varicella zoster virus glycoprotein E, recombinant   Iodinated Contrast Media Anxiety and Other (See Comments)    Chills and anxiety  Other Reaction(s): chills/shaking, Kidney dye    Patient Measurements:    Vital Signs: Temp: 97.8 F (36.6 C) (10/05 0809) Temp Source: Oral (10/05 0809) BP: 111/57 (10/05 1140) Pulse Rate: 61 (10/05 1140)  Labs: Recent Labs    06/15/24 1323 06/15/24 1924 06/16/24 0406 06/16/24 1428  HGB 12.4 12.3 11.7*  --   HCT 37.2 35.8* 34.1*  --   PLT 302 320 300  --   APTT  --   --  63* 62*  HEPARINUNFRC  --   --  >1.10* >1.10*  CREATININE 1.44* 1.59*  --   --     Estimated Creatinine Clearance: 15.6 mL/min (A) (by C-G formula based on SCr of 1.59 mg/dL (H)).   Medical History: Past Medical History:  Diagnosis Date   Afib (HCC)    Arthritis of ankle    CAD (coronary artery disease)    Cancer (HCC) 2012   breast cancer-right   Compression fracture of T12 vertebra (HCC)    Congestive heart disease (HCC)    Diverticulosis    Edema    Elevated uric acid in blood     GERD (gastroesophageal reflux disease)    GI bleed 12/2015   Hyperlipidemia    Hypertension    Long-term (current) use of anticoagulants    Moderate mitral regurgitation    Personal history of radiation therapy    Pulmonary HTN (HCC)    Echo 1/19: EF 60-65, no RWMA, Ao sclerosis, trivial AI, mild MR, mod BAE, mod TR, PASP 49   Stroke (HCC) 01/25/2016   TIA (transient ischemic attack) 09/2011   Urinary incontinence    Urinary retention     Medications:  - on Eliquis  2.5mg  bid PTA (last dose taken on 10/3 PM)  Assessment: Patient is 89 y.o F with hx afib on Eliquis  PTA who presented to the ED on 06/15/24 with c/o abdominal pain, n/v and constipation.  Abdominal CT showed small bowel inflammation and possible small bowel obstruction. Pharmacy has been consulted to dose heparin drip while pt's Eliquis  is placed on hold at this time.   10/5 PM update: aPTT 62 (just below therapeutic range despite recent increase), HL > 1.1. Incr to 700 units/hr. Recheck aPTT in 8 hours   Goal of Therapy:  Heparin level 0.3-0.7 units/ml aPTT 66-102 seconds Monitor platelets by anticoagulation protocol: Yes   Plan:  -Inc heparin to 700 units/hr -aPTT in 8 hours  Rankin Sams,  PharmD, BCPS, BCCCP Clinical Pharmacist

## 2024-06-16 NOTE — Progress Notes (Signed)
 Assisted pt to sit in chair in room per pt request. Family in room with pt

## 2024-06-16 NOTE — Hospital Course (Addendum)
 Adrienne [name] is a 89 y.o. F with severe bladder prolapse and chronic indwelling foley, cAF on Eliquis , dCHF, pHTN, CAD, CKD IIIb baseline 1.1-1.4, and hx CVA admitted for small bowel obstruction.

## 2024-06-16 NOTE — Progress Notes (Signed)
  Progress Note   Patient: Adrienne [name] FMW:[id number] DOB: 1928-01-06 DOA: 06/15/2024     1 DOS: the patient was seen and examined on 06/16/2024 at 10:04AM      Brief hospital course: Adrienne [name] is a 89 y.o. F with severe bladder prolapse and chronic indwelling foley, cAF on Eliquis , dCHF, pHTN, CAD, CKD IIIb baseline 1.1-1.4, and hx CVA admitted for small bowel obstruction.        Assessment and Plan: Small bowel obstruction No improvement, no vomiting.  Lactate normal. - Serial exams - Continue IV fluids, NPO - Analgesics and antiemetics PRN - Consult General Surgery, appreciate cares - Hold Eliquis  --> continue heparin    Chronic diastolic CHF (congestive heart failure) (HCC) Pulmonary hypertension Coronary artery disease -Hold Eliquis  - Hold Lipitor, metoprolol , Lasix  until able to take p.o.  Bladder prolapse and chronic indwelling foley -Maintain Foley  CKD (chronic kidney disease), stage IIIb (HCC) Cr stable in baseline range 1.1-1.4   Permanent atrial fibrillation (HCC) Rate currently controlled -Hold Eliquis  - Continue metoprolol  IV as needed        Subjective: Patient still having a lot of tenderness.  She has not had any vomiting.  She has no confusion or respiratory symptoms.     Physical Exam: BP (!) 111/57 (BP Location: Left Arm)   Pulse 61   Temp 97.8 F (36.6 C) (Oral)   Resp 18   LMP 09/12/1973 (Approximate)   SpO2 94%   Thin elderly female, lying in bed, appears weak and tired, but responds appropriately to questions RRR, no murmurs, no peripheral edema Respiratory normal, lungs clear without rales or wheezes Abdomen soft, very tender throughout to light touch, guarding noted, no rigidity, mildly distended Attention normal, oriented x 3, upper extremity strength generally weak but symmetric, face symmetric, speech fluent     Data Reviewed: Discussed with general surgery Lactic acid 1.4-1.5 CBC shows no  leukocytosis Electrolyte panel last night normal      Family Communication:     Disposition: Status is: Inpatient         Author: Lonni SHAUNNA Dalton, MD 06/16/2024 12:16 PM  For on call review [email or web address].

## 2024-06-16 NOTE — Progress Notes (Signed)
   06/16/24 0809  Assess: MEWS Score  Temp 97.8 F (36.6 C)  BP 100/66  MAP (mmHg) 77  Pulse Rate (!) 103  Resp 19  Level of Consciousness Alert  SpO2 97 %  O2 Device Room Air  Assess: MEWS Score  MEWS Temp 0  MEWS Systolic 1  MEWS Pulse 1  MEWS RR 0  MEWS LOC 0  MEWS Score 2  MEWS Score Color Yellow  Assess: if the MEWS score is Yellow or Red  Were vital signs accurate and taken at a resting state? Yes  Does the patient meet 2 or more of the SIRS criteria? No  MEWS guidelines implemented  Yes, yellow  Treat  MEWS Interventions Considered administering scheduled or prn medications/treatments as ordered  Take Vital Signs  Increase Vital Sign Frequency  Yellow: Q2hr x1, continue Q4hrs until patient remains green for 12hrs  Escalate  MEWS: Escalate Yellow: Discuss with charge nurse and consider notifying provider and/or RRT  Notify: Charge Nurse/RN  Name of Charge Nurse/RN Notified [facility]  Provider Notification  Provider Name/Title Danford,MD  Date Provider Notified 06/16/24  Time Provider Notified [phone]  Method of Notification Page  Notification Reason Other (Comment) (Yellow MEWS)  Provider response No new orders  Date of Provider Response 06/16/24  Time of Provider Response 0831  Assess: SIRS CRITERIA  SIRS Temperature  0  SIRS Respirations  0  SIRS Pulse 1  SIRS WBC 0  SIRS Score Sum  1

## 2024-06-16 NOTE — Progress Notes (Signed)
 Pt assisted from restroom back to ed. Bed alarm on, family at bedside. Call light in reach. All needs met at this time.

## 2024-06-16 NOTE — Progress Notes (Signed)
 Subjective: CC: Patient reports central abdominal pain that she thinks is better. No n/v. No flatus. She thinks she may had a very small bm this am but it was flushed before she could tell.   Objective: Vital signs in last 24 hours: Temp:  [97.5 F (36.4 C)-99.3 F (37.4 C)] 97.8 F (36.6 C) (10/05 0809) Pulse Rate:  [62-105] 103 (10/05 0809) Resp:  [19-24] 19 (10/05 0809) BP: (100-118)/(54-98) 100/66 (10/05 0809) SpO2:  [93 %-100 %] 97 % (10/05 0809) Last BM Date : 06/16/24  Intake/Output from previous day: 10/04 0701 - 10/05 0700 In: 349.2 [I.V.:349.2] Out: 120 [Urine:120] Intake/Output this shift: Total I/O In: -  Out: 300 [Urine:300]  PE: Gen:  Alert, NAD, pleasant Abd: Soft, mild distension, she is fairly ttp throughout her abdomen that is greatest in the central abdomen without rigidity, guarding or rebound  Lab Results:  Recent Labs    06/15/24 1924 06/16/24 0406  WBC 14.9* 14.3*  HGB 12.3 11.7*  HCT 35.8* 34.1*  PLT 320 300   BMET Recent Labs    06/15/24 1323 06/15/24 1924  NA 137  --   K 5.0  --   CL 102  --   CO2 25  --   GLUCOSE 83  --   BUN 33*  --   CREATININE 1.44* 1.59*  CALCIUM  9.4  --    PT/INR No results for input(s): LABPROT, INR in the last 72 hours. CMP     Component Value Date/Time   NA 137 06/15/2024 1323   NA 138 04/21/2023 1059   K 5.0 06/15/2024 1323   CL 102 06/15/2024 1323   CO2 25 06/15/2024 1323   GLUCOSE 83 06/15/2024 1323   BUN 33 (H) 06/15/2024 1323   BUN 18 04/21/2023 1059   CREATININE 1.59 (H) 06/15/2024 1924   CREATININE 1.15 (H) 06/29/2023 1443   CALCIUM  9.4 06/15/2024 1323   PROT 6.1 (L) 06/15/2024 1323   PROT 6.6 05/06/2019 1608   ALBUMIN 3.3 (L) 06/15/2024 1323   ALBUMIN 3.9 05/06/2019 1608   AST 27 06/15/2024 1323   AST 16 06/29/2023 1443   ALT 20 06/15/2024 1323   ALT 11 06/29/2023 1443   ALKPHOS 104 06/15/2024 1323   BILITOT 1.1 06/15/2024 1323   BILITOT 0.5 06/29/2023 1443    GFRNONAA 30 (L) 06/15/2024 1924   GFRNONAA 44 (L) 06/29/2023 1443   GFRAA 43 (L) 11/04/2020 1647   Lipase     Component Value Date/Time   LIPASE 48 06/15/2024 1323    Studies/Results: CT ABDOMEN PELVIS WO CONTRAST Addendum Date: 06/15/2024 ADDENDUM REPORT: 06/15/2024 15:13 ADDENDUM: These results were called by telephone at the time of interpretation on 06/15/2024 at 3:13 pm to provider RILEY RANSOM , who verbally acknowledged these results. Electronically Signed   By: Maude Naegeli M.D.   On: 06/15/2024 15:13   Result Date: 06/15/2024 CLINICAL DATA:  Bowel obstruction suspected EXAM: CT ABDOMEN AND PELVIS WITHOUT CONTRAST TECHNIQUE: Multidetector CT imaging of the abdomen and pelvis was performed following the standard protocol without IV contrast. RADIATION DOSE REDUCTION: This exam was performed according to the departmental dose-optimization program which includes automated exposure control, adjustment of the mA and/or kV according to patient size and/or use of iterative reconstruction technique. COMPARISON:  CT of the abdomen and pelvis performed May 26, 2024 FINDINGS: Lower chest: No acute abnormality.  Heart is enlarged. Hepatobiliary: Small volume of perihepatic ascites. The gallbladder is surgically absent. Pancreas: Unchanged. Spleen:  Unchanged. Adrenals/Urinary Tract: The adrenal glands are unchanged. The right kidney is within normal limits and there is no evidence of hydronephrosis. The left renal collecting system and left ureter are dilated, similar when compared to the previous examination. Similar small left renal calculus. An indwelling urinary catheter is present. The bladder is decompressed. Stomach/Bowel: The stomach is nondilated. The proximal duodenal is mildly dilated and fluid filled, similar when compared to May 26, 2024. There is increasing dilatation and inflammatory change involving the proximal jejunum in the left abdomen. An area of abnormally dilated small  bowel with associated air-fluid levels is annotated. Just proximal to this dilatation there is significant inflammatory change and likely wall thickening. In the mid abdomen on image 42 of CT series 4, there is swirling of the central mesentery as well as a loop of jejunum with a transition point at that site, likely resulting in obstruction (or partial obstruction). The bowel distal to this segment is less nondilated. No evidence of free intraperitoneal air. Extensive colonic diverticulosis. A small rectal stool ball is present. Vascular/Lymphatic: No abdominal aortic aneurysm. Atherosclerotic changes are present in the abdominal aorta and major branch vessels. Reproductive: Uterus and bilateral adnexa are unremarkable. Other: Surgical clips are present in the right abdomen. Musculoskeletal: Degenerative changes in the imaged osseous structures. IMPRESSION: 1. Abnormal appearance of the mid small bowel with rotation of the mesentery resulting in obstruction or partial obstruction of mid jejunum and associated proximal bowel dilatation with significant inflammatory change. 2. Similar appearance of left-sided hydronephrosis and dilated ureter. Electronically Signed: By: Maude Naegeli M.D. On: 06/15/2024 14:58    Anti-infectives: Anti-infectives (From admission, onward)    None        Assessment/Plan This is a 89 year old female with a history of A-fib on Eliquis  (LD 10/3 PM), CHF, CVA/TIA, CKD, hypertension, hyperlipidemia, nocturnal hypoxia on 2 L, and pulmonary hypertension with CT scanning findings of mid small bowel with rotation of the mesentery resulting in obstruction or partial obstruction of the mid jejunum and associated proximal bowel dilation with significant inflammatory change.  She reports her abdominal pain is slightly improved this morning but she is still fairly tender to palpation on exam although without any peritonitis.  No systolic hypertension on last vitals and she is afebrile.   Her lactic acid was within normal limits on last check.  Her WBC has elevated since admission at 14.3 from 10.5.  Her creatinine is also slightly worsened.  She has a Foley in place for urinary retention with only 120 mL of urine output overnight.  Will reach out to [facility] about fluid resuscitation.  I am concerned about her abdominal exam.  I will obtain an x-ray as well as a repeat lactic acid this morning.  I will asked my attending to stop by to examine the patient's abdomen as well.  I had a in-depth conversation with the patient and her daughter at bedside about above.  I discussed the findings of her CT scan, and possible risks of not proceeding to the operating room if she was to worsen including but not limited to bowel ischemia, perforation and even death.  I also discussed with her that I think she is high risk for surgery in the setting of her past medical history.  I discussed risks with her. We also discussed typical post-operative care including the possible need for rehab/snf if necessary (lives at [facility] with her husband currently).  The patient reports if she was to need surgery she would like  to proceed with surgery. Further recommendations to follow.   FEN - NPO. May need NGT. IVF per [facility] - reaching out to them VTE - SCDs, heparin gtt ID - None Foley - In place Plan - As above  I reviewed nursing notes, hospitalist notes, last 24 h vitals and pain scores, last 48 h intake and output, last 24 h labs and trends, and last 24 h imaging results.    LOS: 1 day    Ozell CHRISTELLA Shaper, [facility] Surgery 06/16/2024, 8:23 AM Please see Amion for pager number during day hours 7:00am-4:30pm

## 2024-06-17 ENCOUNTER — Inpatient Hospital Stay (HOSPITAL_COMMUNITY)

## 2024-06-17 DIAGNOSIS — K56609 Unspecified intestinal obstruction, unspecified as to partial versus complete obstruction: Secondary | ICD-10-CM | POA: Diagnosis not present

## 2024-06-17 LAB — URINE CULTURE: Culture: >=100000 — AB

## 2024-06-17 LAB — COMPREHENSIVE METABOLIC PANEL WITH GFR
ALT: 15 U/L (ref 0–44)
AST: 22 U/L (ref 15–41)
Albumin: 2.2 g/dL — ABNORMAL LOW (ref 3.5–5.0)
Alkaline Phosphatase: 101 U/L (ref 38–126)
Anion gap: 12 (ref 5–15)
BUN: 41 mg/dL — ABNORMAL HIGH (ref 8–23)
CO2: 21 mmol/L — ABNORMAL LOW (ref 22–32)
Calcium: 8 mg/dL — ABNORMAL LOW (ref 8.9–10.3)
Chloride: 107 mmol/L (ref 98–111)
Creatinine, Ser: 1.45 mg/dL — ABNORMAL HIGH (ref 0.44–1.00)
GFR, Estimated: 33 mL/min — ABNORMAL LOW (ref >60)
Glucose, Bld: 37 mg/dL — CL (ref 70–99)
Potassium: 3.7 mmol/L (ref 3.5–5.1)
Sodium: 140 mmol/L (ref 135–145)
Total Bilirubin: 1.6 mg/dL — ABNORMAL HIGH (ref 0.0–1.2)
Total Protein: 5.4 g/dL — ABNORMAL LOW (ref 6.5–8.1)

## 2024-06-17 LAB — GLUCOSE, CAPILLARY
Glucose-Capillary: 152 mg/dL — ABNORMAL HIGH (ref 70–99)
Glucose-Capillary: 18 mg/dL — CL (ref 70–99)
Glucose-Capillary: 19 mg/dL — CL (ref 70–99)
Glucose-Capillary: 196 mg/dL — ABNORMAL HIGH (ref 70–99)
Glucose-Capillary: 239 mg/dL — ABNORMAL HIGH (ref 70–99)
Glucose-Capillary: 299 mg/dL — ABNORMAL HIGH (ref 70–99)
Glucose-Capillary: 301 mg/dL — ABNORMAL HIGH (ref 70–99)
Glucose-Capillary: 49 mg/dL — ABNORMAL LOW (ref 70–99)
Glucose-Capillary: 90 mg/dL (ref 70–99)
Glucose-Capillary: 93 mg/dL (ref 70–99)

## 2024-06-17 LAB — HEPARIN LEVEL (UNFRACTIONATED): Heparin Unfractionated: 0.77 [IU]/mL — ABNORMAL HIGH (ref 0.30–0.70)

## 2024-06-17 LAB — CBC
HCT: 33.6 % — ABNORMAL LOW (ref 36.0–46.0)
Hemoglobin: 11.1 g/dL — ABNORMAL LOW (ref 12.0–15.0)
MCH: 30.9 pg (ref 26.0–34.0)
MCHC: 33 g/dL (ref 30.0–36.0)
MCV: 93.6 fL (ref 80.0–100.0)
Platelets: 308 K/uL (ref 150–400)
RBC: 3.59 MIL/uL — ABNORMAL LOW (ref 3.87–5.11)
RDW: 14.2 % (ref 11.5–15.5)
WBC: 13.8 K/uL — ABNORMAL HIGH (ref 4.0–10.5)
nRBC: 0 % (ref 0.0–0.2)

## 2024-06-17 LAB — APTT
aPTT: 65 s — ABNORMAL HIGH (ref 24–36)
aPTT: 77 s — ABNORMAL HIGH (ref 24–36)

## 2024-06-17 MED ORDER — METOPROLOL TARTRATE 50 MG PO TABS
100.0000 mg | ORAL_TABLET | Freq: Two times a day (BID) | ORAL | Status: DC
  Administered 2024-06-17 – 2024-06-20 (×6): 100 mg via ORAL
  Filled 2024-06-17 (×6): qty 2

## 2024-06-17 MED ORDER — SODIUM CHLORIDE 0.9 % IV SOLN: INTRAVENOUS | Status: DC

## 2024-06-17 MED ORDER — DEXTROSE 50 % IV SOLN
INTRAVENOUS | Status: AC
  Filled 2024-06-17: qty 50

## 2024-06-17 MED ORDER — ACETAMINOPHEN 10 MG/ML IV SOLN
1000.0000 mg | Freq: Once | INTRAVENOUS | Status: AC
  Administered 2024-06-17: 1000 mg via INTRAVENOUS
  Filled 2024-06-17: qty 100

## 2024-06-17 MED ORDER — MUPIROCIN 2 % EX OINT: 1.0000 | TOPICAL_OINTMENT | Freq: Two times a day (BID) | CUTANEOUS | Status: DC

## 2024-06-17 MED ORDER — PANTOPRAZOLE SODIUM 40 MG PO TBEC
40.0000 mg | DELAYED_RELEASE_TABLET | Freq: Every day | ORAL | Status: DC
  Administered 2024-06-17 – 2024-06-20 (×4): 40 mg via ORAL
  Filled 2024-06-17 (×4): qty 1

## 2024-06-17 MED ORDER — DIATRIZOATE MEGLUMINE & SODIUM 66-10 % PO SOLN
90.0000 mL | Freq: Once | ORAL | Status: AC
  Administered 2024-06-17: 90 mL via ORAL
  Filled 2024-06-17: qty 90

## 2024-06-17 MED ORDER — DEXTROSE 5 % IV SOLN: INTRAVENOUS | Status: DC

## 2024-06-17 MED ORDER — CHLORHEXIDINE GLUCONATE CLOTH 2 % EX PADS: 6.0000 | MEDICATED_PAD | Freq: Every day | CUTANEOUS | Status: DC

## 2024-06-17 NOTE — Progress Notes (Signed)
 This nurse was notified of a critical lab glucose value of 37. At the time in entering the patient's room she was sitting on the toilet complaining of feeling dizzy. On assessment patient was pale, cold and clammy. Patient assisted back to bed x2 assist and CBG checked and read 18. Hypoglycemic protocol started, Danford, MD was notified and new orders placed. Patient was given 1 amp of D5, blood sugar was 19 after 15 minutes of administration. D5 fluids started @125  and glucose was now 49. Second amp of D5 given, CBG rechecked after 15 mins, blood glucose now 93. Cardiac monitoring initiated. Patient verbalized feeling better. CBG rechecked after 30 mins, glucose now is 196.

## 2024-06-17 NOTE — Progress Notes (Signed)
 Changed IV sites Heparin at 7.5 going into the R AC and the D5 at 125 going into the L FA.

## 2024-06-17 NOTE — Progress Notes (Signed)
 PHARMACY - ANTICOAGULATION CONSULT NOTE  Pharmacy Consult for heparin  Indication: hx atrial fibrillation (PTA Eliquis  on hold)  Allergies  Allergen Reactions   Valium [Diazepam] Other (See Comments)    HYPERACTIVITY    Amiodarone Hcl [Amiodarone] Other (See Comments)    A-Fib   Compazine [Prochlorperazine] Other (See Comments)    HYPERACTIVITY    Other Other (See Comments)    Kidney dye--patient states this gave her severe shakes/chills   Thorazine [Chlorpromazine] Other (See Comments)    HYPERACTIVITY    Zometa [Zoledronic Acid] Other (See Comments)    PROBLEMS WITH EYES  TIA   Ciprofloxacin  Swelling and Other (See Comments)    swelling in Rt.elbow   Doxycycline  Nausea Only   Zoster Vac Recomb Adjuvanted Other (See Comments)    Welt under injection site   Zoster Vaccine Recombinant, Adjuvanted Other (See Comments)    Other Reaction(s): wel under injection  varicella zoster virus glycoprotein E, recombinant   Iodinated Contrast Media Anxiety and Other (See Comments)    Chills and anxiety  Other Reaction(s): chills/shaking, Kidney dye    Patient Measurements:    Vital Signs: Temp: 98.4 F (36.9 C) (10/05 2020) Temp Source: Oral (10/05 2020) BP: 116/56 (10/05 2020) Pulse Rate: 101 (10/05 2020)  Labs: Recent Labs    06/15/24 1323 06/15/24 1924 06/16/24 0406 06/16/24 1428 06/16/24 2335  HGB 12.4 12.3 11.7*  --   --   HCT 37.2 35.8* 34.1*  --   --   PLT 302 320 300  --   --   APTT  --   --  63* 62* 65*  HEPARINUNFRC  --   --  >1.10* >1.10*  --   CREATININE 1.44* 1.59*  --   --   --     Estimated Creatinine Clearance: 15.6 mL/min (A) (by C-G formula based on SCr of 1.59 mg/dL (H)).   Medical History: Past Medical History:  Diagnosis Date   Afib (HCC)    Arthritis of ankle    CAD (coronary artery disease)    Cancer (HCC) 2012   breast cancer-right   Compression fracture of T12 vertebra (HCC)    Congestive heart disease (HCC)    Diverticulosis     Edema    Elevated uric acid in blood    GERD (gastroesophageal reflux disease)    GI bleed 12/2015   Hyperlipidemia    Hypertension    Long-term (current) use of anticoagulants    Moderate mitral regurgitation    Personal history of radiation therapy    Pulmonary HTN (HCC)    Echo 1/19: EF 60-65, no RWMA, Ao sclerosis, trivial AI, mild MR, mod BAE, mod TR, PASP 49   Stroke (HCC) 01/25/2016   TIA (transient ischemic attack) 09/2011   Urinary incontinence    Urinary retention     Medications:  - on Eliquis  2.5mg  bid PTA (last dose taken on 10/3 PM)  Assessment: Patient is 89 y.o F with hx afib on Eliquis  PTA who presented to the ED on 06/15/24 with c/o abdominal pain, n/v and constipation.  Abdominal CT showed small bowel inflammation and possible small bowel obstruction. Pharmacy has been consulted to dose heparin drip while pt's Eliquis  is placed on hold at this time.   10/6 AM update:  aPTT sub-therapeutic   Goal of Therapy:  Heparin level 0.3-0.7 units/ml aPTT 66-102 seconds Monitor platelets by anticoagulation protocol: Yes   Plan:  -Inc heparin to 750 units/hr -Heparin level and aPTT in 8 hours  Lynwood Mckusick, PharmD, BCPS Clinical Pharmacist Phone: [phone]

## 2024-06-17 NOTE — Plan of Care (Signed)

## 2024-06-17 NOTE — Progress Notes (Signed)
 Oral contrast given at 1019, Radiology called to confirm given time.

## 2024-06-17 NOTE — Progress Notes (Signed)
 Mobility Specialist Progress Note:    06/17/24 1117  Mobility  Activity Pivoted/transferred from bed to chair  Level of Assistance Contact guard assist, steadying assist  Assistive Device Front wheel walker  Distance Ambulated (ft) 3 ft  Activity Response Tolerated well  Mobility Referral Yes  Mobility visit 1 Mobility  Mobility Specialist Start Time (ACUTE ONLY) 1054  Mobility Specialist Stop Time (ACUTE ONLY) 1109  Mobility Specialist Time Calculation (min) (ACUTE ONLY) 15 min   Received pt in bed and agreeable to mobility. Pt required MinG for safety. Pt c/o fatigue, otherwise tolerated well. Pt left in chair with personal belongings and call light within reach. All needs met.  Lavanda Pollack Mobility Specialist  Please contact via [employer] or  Rehab Office [phone]

## 2024-06-17 NOTE — Progress Notes (Signed)
  Progress Note   Patient: Adrienne [name] FMW:[id number] DOB: May 19, 1928 DOA: 06/15/2024     2 DOS: the patient was seen and examined on 06/17/2024 at 9:20AM and 12:50PM      Brief hospital course: Adrienne [name] is a 89 y.o. F with severe bladder prolapse and chronic indwelling foley, cAF on Eliquis , dCHF, pHTN, CAD, CKD IIIb baseline 1.1-1.4, and hx CVA admitted for small bowel obstruction.        Assessment and Plan: Small bowel obstruction Pain is improved, she passed some small liquid stools today.  X-ray this morning shows persistent air in the colon however. -N.p.o. - IV fluids - General Surgery plan for Gastrografin study later today - Continue analgesics and antiemetics - Hold Eliquis  - Continue heparin    Hypoglycemia Fluids yesterday administered was LR only, and overnight fluid stopped.  This morning labs showed severe hyperglycemia.,  Patient was dizzy and headache.  Treated with dextrose  and symptoms resolved - Continue dextrose  containing fluids  -Accu-Cheks overnight  Chronic diastolic CHF (congestive heart failure) (HCC) Pulmonary hypertension Coronary artery disease -Continue heparin bridge - Hold Eliquis  - Hold Lipitor, metoprolol , Lasix  until able to take p.o.   Bladder prolapse and chronic indwelling foley -Maintain chronic indwelling Foley   CKD (chronic kidney disease), stage IIIb (HCC) Creatinine stable in baseline range (1.1-1.4)   Permanent atrial fibrillation (HCC) Atrial fibrillation in the 110 and 120s this afternoon -Resume metoprolol  when able to take p.o. - Continue heparin bridge - IV metoprolol  as needed             Subjective: Patient had hypoglycemia today.  She has had small bowel movement, her abdominal pain is improved.     Physical Exam: BP (!) 100/59 (BP Location: Left Arm)   Pulse 85   Temp 97.7 F (36.5 C) (Oral)   Resp 16   LMP 09/12/1973 (Approximate)   SpO2 98%   Frail elderly adult female,  lying in bed, interactive and appropriate Heart rate controlled, rhythm irregular, no murmurs, no peripheral edema Respiratory rate normal, lungs clear without rales or wheezes Abdomen soft with mild tenderness, no guarding, no distention Attention normal, affect normal, judgment and insight appear normal    Data Reviewed: Discussed with general surgery Basic metabolic panel shows normal electrolytes, stable renal function CBC shows stable leukocytosis, mild anemia Glucose low  Abdomen x-ray report reviewed   Family Communication: Daughter at the bedside    Disposition: Status is: Inpatient         Author: Lonni SHAUNNA Dalton, MD 06/17/2024 5:24 PM  For on call review [email or web address].

## 2024-06-17 NOTE — Progress Notes (Signed)
 Patient CBG 299, no current orders for coverage, Danford, MD made aware.

## 2024-06-17 NOTE — Care Plan (Signed)
 Large BM this afternoon, feeling better.  Cautious clears tonight.

## 2024-06-17 NOTE — Progress Notes (Signed)
 PHARMACY - ANTICOAGULATION CONSULT NOTE  Pharmacy Consult for heparin  Indication: hx atrial fibrillation (PTA Eliquis  on hold)  Allergies  Allergen Reactions   Valium [Diazepam] Other (See Comments)    HYPERACTIVITY    Amiodarone Hcl [Amiodarone] Other (See Comments)    A-Fib   Compazine [Prochlorperazine] Other (See Comments)    HYPERACTIVITY    Other Other (See Comments)    Kidney dye--patient states this gave her severe shakes/chills   Thorazine [Chlorpromazine] Other (See Comments)    HYPERACTIVITY    Zometa [Zoledronic Acid] Other (See Comments)    PROBLEMS WITH EYES  TIA   Ciprofloxacin  Swelling and Other (See Comments)    swelling in Rt.elbow   Doxycycline  Nausea Only   Zoster Vac Recomb Adjuvanted Other (See Comments)    Welt under injection site   Zoster Vaccine Recombinant, Adjuvanted Other (See Comments)    Other Reaction(s): wel under injection  varicella zoster virus glycoprotein E, recombinant   Iodinated Contrast Media Anxiety and Other (See Comments)    Chills and anxiety  Other Reaction(s): chills/shaking, Kidney dye    Patient Measurements:    Vital Signs: Temp: 97.7 F (36.5 C) (10/06 0940) Temp Source: Oral (10/06 0940) BP: 100/59 (10/06 0940) Pulse Rate: 85 (10/06 0940)  Labs: Recent Labs    06/15/24 1323 06/15/24 1924 06/15/24 1924 06/16/24 0406 06/16/24 1428 06/16/24 2335 06/17/24 1017  HGB 12.4 12.3  --  11.7*  --   --  11.1*  HCT 37.2 35.8*  --  34.1*  --   --  33.6*  PLT 302 320  --  300  --   --  308  APTT  --   --    < > 63* 62* 65* 77*  HEPARINUNFRC  --   --   --  >1.10* >1.10*  --  0.77*  CREATININE 1.44* 1.59*  --   --   --   --  1.45*   < > = values in this interval not displayed.    Estimated Creatinine Clearance: 17.2 mL/min (A) (by C-G formula based on SCr of 1.45 mg/dL (H)).   Medical History: Past Medical History:  Diagnosis Date   Afib (HCC)    Arthritis of ankle    CAD (coronary artery disease)     Cancer (HCC) 2012   breast cancer-right   Compression fracture of T12 vertebra (HCC)    Congestive heart disease (HCC)    Diverticulosis    Edema    Elevated uric acid in blood    GERD (gastroesophageal reflux disease)    GI bleed 12/2015   Hyperlipidemia    Hypertension    Long-term (current) use of anticoagulants    Moderate mitral regurgitation    Personal history of radiation therapy    Pulmonary HTN (HCC)    Echo 1/19: EF 60-65, no RWMA, Ao sclerosis, trivial AI, mild MR, mod BAE, mod TR, PASP 49   Stroke (HCC) 01/25/2016   TIA (transient ischemic attack) 09/2011   Urinary incontinence    Urinary retention     Medications:  - on Eliquis  2.5mg  bid PTA (last dose taken on 10/3 PM)  Assessment: 89 y.o F with hx afib on Eliquis  PTA who presented to the ED on 10/4 with abdominal pain, n/v and constipation.  Abdominal CT showed small bowel inflammation and possible small bowel obstruction. Pharmacy has been consulted to dose heparin drip while PTA Eliquis  is placed on hold at this time.   aPTT level therapeutic, heparin  level artificially elevated due to recent apixaban  dose. NO issues with heparin infusion or s/sx bleeding reported.   Goal of Therapy:  Heparin level 0.3-0.7 units/ml aPTT 66-102 seconds Monitor platelets by anticoagulation protocol: Yes   Plan:  Continue heparin infusion at 750 units/hr Check heparin level  daily while on heparin Continue to monitor H&H and platelets  Thank you for allowing pharmacy to be a part of this patient's care.  Shelba Collier, PharmD, BCPS Clinical Pharmacist

## 2024-06-17 NOTE — Progress Notes (Signed)
 Patient ID: Adrienne [name], female   DOB: 1928/08/09, 89 y.o.   MRN: [id number]      Subjective: Had a liquid BM, less pain ROS negative except as listed above. Objective: Vital signs in last 24 hours: Temp:  [97.5 F (36.4 C)-98.4 F (36.9 C)] 98.4 F (36.9 C) (10/06 0600) Pulse Rate:  [61-108] 108 (10/06 0600) Resp:  [16-20] 18 (10/06 0600) BP: (106-116)/(56-60) 110/58 (10/06 0600) SpO2:  [94 %-99 %] 99 % (10/06 0600) Last BM Date : 06/16/24  Intake/Output from previous day: 10/05 0701 - 10/06 0700 In: 173.7 [I.V.:173.7] Out: 800 [Urine:800] Intake/Output this shift: No intake/output data recorded.  General appearance: alert and cooperative GI: soft, mild tenderness, no peritonitis  Lab Results: CBC  Recent Labs    06/15/24 1924 06/16/24 0406  WBC 14.9* 14.3*  HGB 12.3 11.7*  HCT 35.8* 34.1*  PLT 320 300   BMET Recent Labs    06/15/24 1323 06/15/24 1924  NA 137  --   K 5.0  --   CL 102  --   CO2 25  --   GLUCOSE 83  --   BUN 33*  --   CREATININE 1.44* 1.59*  CALCIUM  9.4  --    PT/INR No results for input(s): LABPROT, INR in the last 72 hours. ABG No results for input(s): PHART, HCO3 in the last 72 hours.  Invalid input(s): PCO2, PO2  Studies/Results: DG Abd Portable 1V Result Date: 06/16/2024 CLINICAL DATA:  Small-bowel obstruction. EXAM: PORTABLE ABDOMEN - 1 VIEW COMPARISON:  CT abdomen pelvis 06/15/2024 FINDINGS: There is evidence of some persistent dilated central small bowel loops measuring up to 4.7 cm. Air present in the colon. Findings are consistent with partial small bowel obstruction. No evidence of free intraperitoneal air. No abnormal calcifications. Diffuse degenerative disease of the spine. IMPRESSION: Persistent partial small bowel obstruction. Electronically Signed   By: Marcey Moan M.D.   On: 06/16/2024 09:51   CT ABDOMEN PELVIS WO CONTRAST Addendum Date: 06/15/2024 ADDENDUM REPORT: 06/15/2024 15:13 ADDENDUM:  These results were called by telephone at the time of interpretation on 06/15/2024 at 3:13 pm to provider RILEY RANSOM , who verbally acknowledged these results. Electronically Signed   By: Maude Naegeli M.D.   On: 06/15/2024 15:13   Result Date: 06/15/2024 CLINICAL DATA:  Bowel obstruction suspected EXAM: CT ABDOMEN AND PELVIS WITHOUT CONTRAST TECHNIQUE: Multidetector CT imaging of the abdomen and pelvis was performed following the standard protocol without IV contrast. RADIATION DOSE REDUCTION: This exam was performed according to the departmental dose-optimization program which includes automated exposure control, adjustment of the mA and/or kV according to patient size and/or use of iterative reconstruction technique. COMPARISON:  CT of the abdomen and pelvis performed May 26, 2024 FINDINGS: Lower chest: No acute abnormality.  Heart is enlarged. Hepatobiliary: Small volume of perihepatic ascites. The gallbladder is surgically absent. Pancreas: Unchanged. Spleen: Unchanged. Adrenals/Urinary Tract: The adrenal glands are unchanged. The right kidney is within normal limits and there is no evidence of hydronephrosis. The left renal collecting system and left ureter are dilated, similar when compared to the previous examination. Similar small left renal calculus. An indwelling urinary catheter is present. The bladder is decompressed. Stomach/Bowel: The stomach is nondilated. The proximal duodenal is mildly dilated and fluid filled, similar when compared to May 26, 2024. There is increasing dilatation and inflammatory change involving the proximal jejunum in the left abdomen. An area of abnormally dilated small bowel with associated air-fluid levels is annotated. Just proximal  to this dilatation there is significant inflammatory change and likely wall thickening. In the mid abdomen on image 42 of CT series 4, there is swirling of the central mesentery as well as a loop of jejunum with a transition point at  that site, likely resulting in obstruction (or partial obstruction). The bowel distal to this segment is less nondilated. No evidence of free intraperitoneal air. Extensive colonic diverticulosis. A small rectal stool ball is present. Vascular/Lymphatic: No abdominal aortic aneurysm. Atherosclerotic changes are present in the abdominal aorta and major branch vessels. Reproductive: Uterus and bilateral adnexa are unremarkable. Other: Surgical clips are present in the right abdomen. Musculoskeletal: Degenerative changes in the imaged osseous structures. IMPRESSION: 1. Abnormal appearance of the mid small bowel with rotation of the mesentery resulting in obstruction or partial obstruction of mid jejunum and associated proximal bowel dilatation with significant inflammatory change. 2. Similar appearance of left-sided hydronephrosis and dilated ureter. Electronically Signed: By: Maude Naegeli M.D. On: 06/15/2024 14:58    Anti-infectives: Anti-infectives (From admission, onward)    None       Assessment/Plan: PSBO - seems to be improving some, will proceed with PO SBO protocol FEN - NPO. IVF VTE - SCDs, heparin gtt ID - None Foley - In place Plan - SBO protocol I spoke with her daughter as well   LOS: 2 days    Dann Hummer, MD, MPH, FACS Trauma & General Surgery Use [email or web address] to contact on call provider  06/17/2024

## 2024-06-17 NOTE — Plan of Care (Signed)
   Problem: Health Behavior/Discharge Planning: Goal: Ability to manage health-related needs will improve Outcome: Progressing   Problem: Clinical Measurements: Goal: Ability to maintain clinical measurements within normal limits will improve Outcome: Progressing Goal: Will remain free from infection Outcome: Progressing Goal: Diagnostic test results will improve Outcome: Progressing

## 2024-06-17 NOTE — TOC Initial Note (Signed)
 Transition of Care [facility]) - Initial/Assessment Note    Patient Details  Name: Adrienne [name] MRN: [id number] Date of Birth: 24-May-1928  Transition of Care [facility]) CM/SW Contact:    Jeoffrey LITTIE Moose, [place] Phone Number: 06/17/2024, 9:08 AM  Clinical Narrative:                 Pt admitted from [facility] due to abdominal pain. CSW will follow for needs.  Expected Discharge Plan: Assisted Living Barriers to Discharge: Continued Medical Work up   Patient Goals and CMS Choice Patient states their goals for this hospitalization and ongoing recovery are:: return to ALF          Expected Discharge Plan and Services       Living arrangements for the past 2 months: Assisted Living Facility                                      Prior Living Arrangements/Services Living arrangements for the past 2 months: Assisted Living Facility Lives with:: Facility Resident Patient language and need for interpreter reviewed:: Yes Do you feel safe going back to the place where you live?: Yes      Need for Family Participation in Patient Care: Yes (Comment) Care giver support system in place?: Yes (comment)   Criminal Activity/Legal Involvement Pertinent to Current Situation/Hospitalization: No - Comment as needed  Activities of Daily Living   ADL Screening (condition at time of admission) Independently performs ADLs?: No Does the patient have a NEW difficulty with bathing/dressing/toileting/self-feeding that is expected to last >3 days?: No Does the patient have a NEW difficulty with getting in/out of bed, walking, or climbing stairs that is expected to last >3 days?: No Does the patient have a NEW difficulty with communication that is expected to last >3 days?: No Is the patient deaf or have difficulty hearing?: No Does the patient have difficulty seeing, even when wearing glasses/contacts?: No Does the patient have difficulty concentrating, remembering, or making  decisions?: No  Permission Sought/Granted Permission sought to share information with : Facility Medical sales representative, Family Supports    Share Information with NAME: Elsie  Permission granted to share info w AGENCY: [facility]  Permission granted to share info w Relationship: Spouse  Permission granted to share info w Contact Information: [phone]  Emotional Assessment Appearance:: Appears stated age Attitude/Demeanor/Rapport: Engaged Affect (typically observed): Pleasant Orientation: : Oriented to Self, Oriented to Place, Oriented to  Time, Oriented to Situation Alcohol / Substance Use: Not Applicable Psych Involvement: No (comment)  Admission diagnosis:  Small bowel obstruction (HCC) [K56.609] SBO (small bowel obstruction) (HCC) [K56.609] Patient Active Problem List   Diagnosis Date Noted   SBO (small bowel obstruction) (HCC) 06/15/2024   UTI (urinary tract infection) 06/03/2023   Cerebrovascular disease 05/05/2023   Hypokalemia 05/04/2023   Normocytic anemia 05/04/2023   Mitral regurgitation 04/20/2023   Hydronephrosis of left kidney 03/24/2023   CKD (chronic kidney disease), stage IIIb (HCC) 03/23/2023   UTI (urinary tract infection) due to urinary indwelling catheter 02/04/2023   Hyponatremia 12/16/2022   Acute cystitis with hydronephrosis due to indwelling foley catheter 12/15/2022   CAP (community acquired pneumonia) 09/03/2022   Catheter-associated urinary tract infection 09/03/2022   Open knee wound 10/05/2021   (HFpEF) heart failure with preserved ejection fraction (HCC) 08/18/2021   Palliative care by specialist    Goals of care, counseling/discussion    DNR (do not  resuscitate) discussion    Vaginal bleeding 08/08/2020   Uterine prolapse 08/08/2020   Vaginal erosion secondary to pessary use 08/08/2020   Acute urinary retention 08/08/2020   Chronic respiratory failure (HCC) 08/08/2020   Multiple pulmonary nodules 05/24/2020   Cough 05/24/2020    Malignant neoplasm of upper-inner quadrant of left breast in female, estrogen receptor negative (HCC) 12/20/2019   Malignant neoplasm of overlapping sites of right breast in female, estrogen receptor positive (HCC) 08/14/2019   Osteopenia 08/14/2019   Diplopia 01/29/2016   GERD (gastroesophageal reflux disease) 01/29/2016   Cerebrovascular accident (CVA) due to embolism of left middle cerebral artery (HCC) 01/29/2016   Bleeding gastrointestinal    Gait disturbance, post-stroke 01/27/2016   Fall    Secondary hypertension, unspecified    Cerebral infarction due to thrombosis of left middle cerebral artery (HCC) s/p mechanical thrombectomy    Malnutrition of moderate degree 01/09/2016   GI bleed 01/06/2016   Permanent atrial fibrillation (HCC) 06/10/2015   Chronic anticoagulation 06/10/2015   Hyperlipidemia 06/10/2015   Essential hypertension 06/10/2015   History of stroke 06/10/2015   Coronary artery disease due to lipid rich plaque 06/10/2015   Elevated uric acid in blood    Arthritis of ankle    Pulmonary HTN (HCC)    PCP:  Cleotilde Planas, MD Pharmacy:   [facility] [id number] - [place], [place] - [address] [place] [address] Phone: [phone] Fax: [phone]  [facility] [address] [place] [address] Phone: [phone] Fax: [phone]  [facility] [address] [place] [address] Phone: [phone] Fax: [phone]  [facility] - [place], [place] - [address] [place] [address] Phone: [phone] Fax: [phone]  [facility] - [place], [place] - [place] [address] [place] [address] Phone: [phone] Fax: [phone]     Social Drivers of Health (SDOH) Social History: SDOH Screenings   Food  Insecurity: No Food Insecurity (06/15/2024)  Housing: Low Risk  (06/15/2024)  Transportation Needs: No Transportation Needs (06/15/2024)  Utilities: Not At Risk (06/15/2024)  Depression (PHQ2-9): Low Risk  (06/12/2024)  Social Connections: Socially Isolated (06/15/2024)  Tobacco Use: Medium Risk (06/15/2024)   SDOH Interventions: Food Insecurity Interventions: Intervention Not Indicated Housing Interventions: Intervention Not Indicated Transportation Interventions: Intervention Not Indicated Utilities Interventions: Intervention Not Indicated Social Connections Interventions: Intervention Not Indicated   Readmission Risk Interventions    06/16/2023    1:21 PM 06/07/2023   10:15 AM 05/05/2023    5:19 PM  Readmission Risk Prevention Plan  Transportation Screening Complete Complete Complete  Medication Review Oceanographer) Complete Complete Complete  PCP or Specialist appointment within 3-5 days of discharge  Complete   HRI or Home Care Consult Complete Complete Complete  SW Recovery Care/Counseling Consult  Complete   Palliative Care Screening Not Applicable Not Applicable Not Applicable  Skilled Nursing Facility Not Applicable Not Applicable Not Applicable

## 2024-06-18 DIAGNOSIS — K56609 Unspecified intestinal obstruction, unspecified as to partial versus complete obstruction: Secondary | ICD-10-CM | POA: Diagnosis not present

## 2024-06-18 LAB — GLUCOSE, CAPILLARY
Glucose-Capillary: 70 mg/dL (ref 70–99)
Glucose-Capillary: 65 mg/dL — ABNORMAL LOW (ref 70–99)
Glucose-Capillary: 70 mg/dL (ref 70–99)
Glucose-Capillary: 89 mg/dL (ref 70–99)
Glucose-Capillary: 96 mg/dL (ref 70–99)
Glucose-Capillary: 135 mg/dL — ABNORMAL HIGH (ref 70–99)
Glucose-Capillary: 123 mg/dL — ABNORMAL HIGH (ref 70–99)

## 2024-06-18 LAB — BASIC METABOLIC PANEL WITH GFR
Anion gap: 9 (ref 5–15)
BUN: 36 mg/dL — ABNORMAL HIGH (ref 8–23)
CO2: 21 mmol/L — ABNORMAL LOW (ref 22–32)
Calcium: 7.9 mg/dL — ABNORMAL LOW (ref 8.9–10.3)
Chloride: 107 mmol/L (ref 98–111)
Creatinine, Ser: 1.25 mg/dL — ABNORMAL HIGH (ref 0.44–1.00)
GFR, Estimated: 40 mL/min — ABNORMAL LOW (ref >60)
Glucose, Bld: 70 mg/dL (ref 70–99)
Potassium: 3 mmol/L — ABNORMAL LOW (ref 3.5–5.1)
Sodium: 137 mmol/L (ref 135–145)

## 2024-06-18 LAB — HEPARIN LEVEL (UNFRACTIONATED)
Heparin Unfractionated: 0.32 [IU]/mL (ref 0.30–0.70)
Heparin Unfractionated: 0.18 [IU]/mL — ABNORMAL LOW (ref 0.30–0.70)

## 2024-06-18 LAB — CBC
HCT: 31.8 % — ABNORMAL LOW (ref 36.0–46.0)
Hemoglobin: 10.7 g/dL — ABNORMAL LOW (ref 12.0–15.0)
MCH: 31.5 pg (ref 26.0–34.0)
MCHC: 33.6 g/dL (ref 30.0–36.0)
MCV: 93.5 fL (ref 80.0–100.0)
Platelets: 257 K/uL (ref 150–400)
RBC: 3.4 MIL/uL — ABNORMAL LOW (ref 3.87–5.11)
RDW: 14.3 % (ref 11.5–15.5)
WBC: 11.4 K/uL — ABNORMAL HIGH (ref 4.0–10.5)
nRBC: 0 % (ref 0.0–0.2)

## 2024-06-18 LAB — MAGNESIUM: Magnesium: 1.9 mg/dL (ref 1.7–2.4)

## 2024-06-18 LAB — APTT
aPTT: 55 s — ABNORMAL HIGH (ref 24–36)
aPTT: 49 s — ABNORMAL HIGH (ref 24–36)

## 2024-06-18 MED ORDER — FUROSEMIDE 40 MG PO TABS
40.0000 mg | ORAL_TABLET | Freq: Every day | ORAL | Status: DC
  Administered 2024-06-18 – 2024-06-20 (×3): 40 mg via ORAL
  Filled 2024-06-18 (×3): qty 1

## 2024-06-18 MED ORDER — BOOST / RESOURCE BREEZE PO LIQD CUSTOM
1.0000 | Freq: Three times a day (TID) | ORAL | Status: DC
  Administered 2024-06-18: 237 mL via ORAL
  Administered 2024-06-18: 1 via ORAL
  Administered 2024-06-19 (×2): 237 mL via ORAL

## 2024-06-18 MED ORDER — KCL IN DEXTROSE-NACL 40-5-0.9 MEQ/L-%-% IV SOLN
INTRAVENOUS | Status: AC
  Filled 2024-06-18: qty 1000

## 2024-06-18 MED ORDER — DEXTROSE-SODIUM CHLORIDE 5-0.9 % IV SOLN: INTRAVENOUS | Status: DC

## 2024-06-18 MED ORDER — POTASSIUM CHLORIDE 20 MEQ PO PACK
40.0000 meq | PACK | Freq: Two times a day (BID) | ORAL | Status: AC
  Administered 2024-06-18 (×2): 40 meq via ORAL
  Filled 2024-06-18 (×2): qty 2

## 2024-06-18 MED ORDER — ALLOPURINOL 100 MG PO TABS
100.0000 mg | ORAL_TABLET | Freq: Every day | ORAL | Status: DC
  Administered 2024-06-18 – 2024-06-20 (×3): 100 mg via ORAL
  Filled 2024-06-18 (×3): qty 1

## 2024-06-18 NOTE — Progress Notes (Signed)
   Subjective/Chief Complaint: Feels much better, tol clears, multiple bms   Objective: Vital signs in last 24 hours: Temp:  [97.6 F (36.4 C)-98 F (36.7 C)] 97.6 F (36.4 C) (10/07 1010) Pulse Rate:  [69-93] 93 (10/07 1010) Resp:  [16-20] 16 (10/07 1010) BP: (106-141)/(58-86) 141/86 (10/07 1010) SpO2:  [80 %-100 %] 80 % (10/07 1010) Last BM Date : 06/17/24  Intake/Output from previous day: 10/06 0701 - 10/07 0700 In: 1185.5 [P.O.:170; I.V.:1015.5] Out: 300 [Urine:300] Intake/Output this shift: No intake/output data recorded.  Ab soft nontender nondistended  Lab Results:  Recent Labs    06/17/24 1017 06/18/24 0617  WBC 13.8* 11.4*  HGB 11.1* 10.7*  HCT 33.6* 31.8*  PLT 308 257   BMET Recent Labs    06/17/24 1017 06/18/24 0617  NA 140 137  K 3.7 3.0*  CL 107 107  CO2 21* 21*  GLUCOSE 37* 70  BUN 41* 36*  CREATININE 1.45* 1.25*  CALCIUM  8.0* 7.9*   PT/INR No results for input(s): LABPROT, INR in the last 72 hours. ABG No results for input(s): PHART, HCO3 in the last 72 hours.  Invalid input(s): PCO2, PO2  Studies/Results: DG Abd Portable 1V-Small Bowel Obstruction Protocol-initial, 8 hr delay Result Date: 06/17/2024 CLINICAL DATA:  Small bowel obstruction EXAM: PORTABLE ABDOMEN - 1 VIEW COMPARISON:  06/16/2024 FINDINGS: Contrast material is demonstrated within distal decompressed small bowel and within the colon to the rectum. This suggests no evidence of complete small bowel obstruction. Gaseous distention of mid abdominal small bowel is again demonstrated. Degenerative changes in the spine. IMPRESSION: Contrast material is demonstrated throughout the colon suggesting no evidence of complete small bowel obstruction. Gaseous distention of mid abdominal small bowel may indicate partial obstruction. Electronically Signed   By: Elsie Gravely M.D.   On: 06/17/2024 21:21    Anti-infectives: Anti-infectives (From admission, onward)    None        Assessment/Plan: PSBO  -clinically and radiologically better -fulls for dinner if tol clears -possibly home in am -can have oral meds  FEN - clears. IVF VTE - SCDs, heparin gtt ID - None   Adrienne [name] 06/18/2024

## 2024-06-18 NOTE — Progress Notes (Signed)
 PHARMACY - ANTICOAGULATION CONSULT NOTE  Pharmacy Consult for heparin  Indication: hx atrial fibrillation (PTA Eliquis  on hold)  Allergies  Allergen Reactions   Valium [Diazepam] Other (See Comments)    HYPERACTIVITY    Amiodarone Hcl [Amiodarone] Other (See Comments)    A-Fib   Compazine [Prochlorperazine] Other (See Comments)    HYPERACTIVITY    Other Other (See Comments)    Kidney dye--patient states this gave her severe shakes/chills   Thorazine [Chlorpromazine] Other (See Comments)    HYPERACTIVITY    Zometa [Zoledronic Acid] Other (See Comments)    PROBLEMS WITH EYES  TIA   Ciprofloxacin  Swelling and Other (See Comments)    swelling in Rt.elbow   Doxycycline  Nausea Only   Zoster Vac Recomb Adjuvanted Other (See Comments)    Welt under injection site   Zoster Vaccine Recombinant, Adjuvanted Other (See Comments)    Other Reaction(s): wel under injection  varicella zoster virus glycoprotein E, recombinant   Iodinated Contrast Media Anxiety and Other (See Comments)    Chills and anxiety  Other Reaction(s): chills/shaking, Kidney dye    Patient Measurements:    Vital Signs: Temp: 97.6 F (36.4 C) (10/07 1010) Temp Source: Oral (10/07 1010) BP: 141/86 (10/07 1010) Pulse Rate: 93 (10/07 1010)  Labs: Recent Labs    06/15/24 1924 06/16/24 0406 06/16/24 1428 06/17/24 1017 06/18/24 0617 06/18/24 1526  HGB 12.3 11.7*  --  11.1* 10.7*  --   HCT 35.8* 34.1*  --  33.6* 31.8*  --   PLT 320 300  --  308 257  --   APTT  --  63*   < > 77* 55* 49*  HEPARINUNFRC  --  >1.10*   < > 0.77* 0.32 0.18*  CREATININE 1.59*  --   --  1.45* 1.25*  --    < > = values in this interval not displayed.    Estimated Creatinine Clearance: 19.9 mL/min (A) (by C-G formula based on SCr of 1.25 mg/dL (H)).   Medical History: Past Medical History:  Diagnosis Date   Afib (HCC)    Arthritis of ankle    CAD (coronary artery disease)    Cancer (HCC) 2012   breast cancer-right    Compression fracture of T12 vertebra (HCC)    Congestive heart disease (HCC)    Diverticulosis    Edema    Elevated uric acid in blood    GERD (gastroesophageal reflux disease)    GI bleed 12/2015   Hyperlipidemia    Hypertension    Long-term (current) use of anticoagulants    Moderate mitral regurgitation    Personal history of radiation therapy    Pulmonary HTN (HCC)    Echo 1/19: EF 60-65, no RWMA, Ao sclerosis, trivial AI, mild MR, mod BAE, mod TR, PASP 49   Stroke (HCC) 01/25/2016   TIA (transient ischemic attack) 09/2011   Urinary incontinence    Urinary retention     Medications:  - on Eliquis  2.5mg  bid PTA (last dose taken on 10/3 PM)  Assessment: 89 y.o F with hx afib on Eliquis  PTA who presented to the ED on 10/4 with abdominal pain, n/v and constipation.  Abdominal CT showed small bowel inflammation and possible small bowel obstruction. Patient on clear liquids with large bowel movement overnight. Pharmacy has been consulted to dose heparin drip while PTA Eliquis  is placed on hold at this time.   10/7 PM: aPTT level sub-therapeutic at 49s and heparin level now appears to be correlating with  aPTT with level subtherapeutic at 0.18- both levels decreased following a rate increase to 800 units/hour. No signs of bleeding or issues with the heparin infusion noted.  Will obtain both HL and aPTT once more to confirm correlation then titrate based off of heparin level.   Goal of Therapy:  Heparin level 0.3-0.7 units/ml aPTT 66-102 seconds Monitor platelets by anticoagulation protocol: Yes   Plan:  Increase heparin to 950 units/hr.  8 hour aPTT and heparin level.  If patient tolerates diet advancement 10/7 consider resuming home Eliquis .  Continue to monitor H&H and platelets  Thank you for allowing pharmacy to be a part of this patient's care.  Massie Fila, PharmD Clinical Pharmacist  06/18/2024 4:39 PM

## 2024-06-18 NOTE — Progress Notes (Signed)
 PHARMACY - ANTICOAGULATION CONSULT NOTE  Pharmacy Consult for heparin  Indication: hx atrial fibrillation (PTA Eliquis  on hold)  Allergies  Allergen Reactions   Valium [Diazepam] Other (See Comments)    HYPERACTIVITY    Amiodarone Hcl [Amiodarone] Other (See Comments)    A-Fib   Compazine [Prochlorperazine] Other (See Comments)    HYPERACTIVITY    Other Other (See Comments)    Kidney dye--patient states this gave her severe shakes/chills   Thorazine [Chlorpromazine] Other (See Comments)    HYPERACTIVITY    Zometa [Zoledronic Acid] Other (See Comments)    PROBLEMS WITH EYES  TIA   Ciprofloxacin  Swelling and Other (See Comments)    swelling in Rt.elbow   Doxycycline  Nausea Only   Zoster Vac Recomb Adjuvanted Other (See Comments)    Welt under injection site   Zoster Vaccine Recombinant, Adjuvanted Other (See Comments)    Other Reaction(s): wel under injection  varicella zoster virus glycoprotein E, recombinant   Iodinated Contrast Media Anxiety and Other (See Comments)    Chills and anxiety  Other Reaction(s): chills/shaking, Kidney dye    Patient Measurements:    Vital Signs: Temp: 97.6 F (36.4 C) (10/07 0421) Temp Source: Oral (10/07 0421) BP: 120/77 (10/07 0421) Pulse Rate: 69 (10/07 0421)  Labs: Recent Labs    06/15/24 1924 06/15/24 1924 06/16/24 0406 06/16/24 1428 06/16/24 2335 06/17/24 1017 06/18/24 0617  HGB 12.3  --  11.7*  --   --  11.1* 10.7*  HCT 35.8*  --  34.1*  --   --  33.6* 31.8*  PLT 320  --  300  --   --  308 257  APTT  --    < > 63* 62* 65* 77* 55*  HEPARINUNFRC  --    < > >1.10* >1.10*  --  0.77* 0.32  CREATININE 1.59*  --   --   --   --  1.45* 1.25*   < > = values in this interval not displayed.    Estimated Creatinine Clearance: 19.9 mL/min (A) (by C-G formula based on SCr of 1.25 mg/dL (H)).   Medical History: Past Medical History:  Diagnosis Date   Afib (HCC)    Arthritis of ankle    CAD (coronary artery disease)     Cancer (HCC) 2012   breast cancer-right   Compression fracture of T12 vertebra (HCC)    Congestive heart disease (HCC)    Diverticulosis    Edema    Elevated uric acid in blood    GERD (gastroesophageal reflux disease)    GI bleed 12/2015   Hyperlipidemia    Hypertension    Long-term (current) use of anticoagulants    Moderate mitral regurgitation    Personal history of radiation therapy    Pulmonary HTN (HCC)    Echo 1/19: EF 60-65, no RWMA, Ao sclerosis, trivial AI, mild MR, mod BAE, mod TR, PASP 49   Stroke (HCC) 01/25/2016   TIA (transient ischemic attack) 09/2011   Urinary incontinence    Urinary retention     Medications:  - on Eliquis  2.5mg  bid PTA (last dose taken on 10/3 PM)  Assessment: 89 y.o F with hx afib on Eliquis  PTA who presented to the ED on 10/4 with abdominal pain, n/v and constipation.  Abdominal CT showed small bowel inflammation and possible small bowel obstruction. Patient on clear liquids with large bowel movement overnight. Pharmacy has been consulted to dose heparin drip while PTA Eliquis  is placed on hold at this time.  aPTT level sub-therapeutic at 55, heparin level artificially elevated due to recent apixaban  dose. NO issues with heparin infusion or s/sx bleeding reported per RN.   Goal of Therapy:  Heparin level 0.3-0.7 units/ml aPTT 66-102 seconds Monitor platelets by anticoagulation protocol: Yes   Plan:  Small increase in heparin to 800units/hr.  8 hour aPTT and heparin level.  If patient tolerates diet advancement 10/7 consider resuming home Eliquis .  Continue to monitor H&H and platelets  Thank you for allowing pharmacy to be a part of this patient's care.  Powell Blush, PharmD, BCCCP  Clinical Pharmacist

## 2024-06-18 NOTE — Plan of Care (Signed)
 Pt had 4 watery brown stool with lot of passing of gas overnight. CBG trending down overnight. This morning CBG 70. On call provider made aware, pt has been taking in small volume by mouth but CBG continue to trend down. Provider adjusted IVF to include small volume D5.    Problem: Education: Goal: Knowledge of General Education information will improve Description: Including pain rating scale, medication(s)/side effects and non-pharmacologic comfort measures Outcome: Progressing   Problem: Health Behavior/Discharge Planning: Goal: Ability to manage health-related needs will improve Outcome: Progressing   Problem: Clinical Measurements: Goal: Ability to maintain clinical measurements within normal limits will improve Outcome: Progressing Goal: Will remain free from infection Outcome: Progressing Goal: Diagnostic test results will improve Outcome: Progressing Goal: Respiratory complications will improve Outcome: Progressing Goal: Cardiovascular complication will be avoided Outcome: Progressing   Problem: Activity: Goal: Risk for activity intolerance will decrease Outcome: Progressing   Problem: Nutrition: Goal: Adequate nutrition will be maintained Outcome: Progressing   Problem: Coping: Goal: Level of anxiety will decrease Outcome: Progressing   Problem: Elimination: Goal: Will not experience complications related to bowel motility Outcome: Progressing Goal: Will not experience complications related to urinary retention Outcome: Progressing   Problem: Pain Managment: Goal: General experience of comfort will improve and/or be controlled Outcome: Progressing   Problem: Safety: Goal: Ability to remain free from injury will improve Outcome: Progressing   Problem: Skin Integrity: Goal: Risk for impaired skin integrity will decrease Outcome: Progressing

## 2024-06-18 NOTE — Care Management Important Message (Signed)
 Important Message  Patient Details  Name: Adrienne [name] MRN: [id number] Date of Birth: March 01, 1928   Important Message Given:  Yes - Medicare IM     Jon Cruel 06/18/2024, 3:10 PM

## 2024-06-18 NOTE — Progress Notes (Signed)
  Progress Note   Patient: Adrienne [name] FMW:[id number] DOB: 04-27-1928 DOA: 06/15/2024     3 DOS: the patient was seen and examined on 06/18/2024 at 9:59AM      Brief hospital course: Adrienne [name] is a 89 y.o. F with severe bladder prolapse and chronic indwelling foley, cAF on Eliquis , dCHF, pHTN, CAD, CKD IIIb baseline 1.1-1.4, and hx CVA admitted for small bowel obstruction.        Assessment and Plan: Small bowel obstruction Admitted and general surgery consulted.  No NG tube was placed, and patient did not have vomiting.  Given oral Gastrografin, started to have a small liquid bowel movements.  Follow-up x-ray showed contrast to the colon.  Advance to clears.  Did well overnight - Advance to full liquid this evening - Continue dextrose  containing fluids, see below - Consult general surgery, appreciate recommendations - Continue heparin bridge for now     Hypoglycemia Patient developed severe hypoglycemia on 10/6, this was treated with dextrose  and resolved.  In the last 24 hours, her sugars have remained stable with dextrose  infusion. - Continue dextrose  fluids until taking p.o. adequately - Continue every 4 Accu-Cheks   Hypokalemia - Supplement potassium  Normocytic anemia Mild, asymptomatic, no clinical bleeding observed or reported - Trend Hgb  Chronic diastolic CHF (congestive heart failure) (HCC) Pulmonary hypertension Coronary artery disease -Continue heparin. - Hold Eliquis  - Hold Lipitor - Resume home metoprolol , Lasix    Bladder prolapse and chronic indwelling foley -Maintain chronic indwelling Foley   CKD (chronic kidney disease), stage IIIb (HCC) Creatinine stable in baseline range (1.1-1.4)   Permanent atrial fibrillation (HCC) Heart rate down to the 80s at rest back on home metoprolol  - Continue home metoprolol  - Continue heparin bridge for now - Hold Eliquis          Subjective: Patient is having some anxiety and dizziness  today, glucoses have remained stable.  She has had some liquid bowel movements during the day today, larger in volume.  Abdominal pain is improved.  No fever.     Physical Exam: BP (!) 141/86 (BP Location: Right Arm)   Pulse 93   Temp 97.6 F (36.4 C) (Oral)   Resp 16   LMP 09/12/1973 (Approximate)   SpO2 (!) 80%   Frail elderly appearing female, lying in bed, interactive and appropriate, hard of hearing Heart rate controlled, rhythm irregularly irregular, no murmurs, no pitting edema in the extremities Respiratory rate normal, lung sounds diminished but no rales or wheezes Abdomen soft, minimal tenderness, no guarding, no distention Attention normal, affect pleasant, hard of hearing, upper extremity strength is 4/5 but symmetric, left arm limited by arthritis, judgment and insight appear baseline    Data Reviewed: Discussed with general surgery Basic metabolic panel shows stable renal function, mild hypokalemia CBC shows improving leukocytosis, mild anemia  Abdomen x-ray, personally reviewed, shows contrast to the colon, still some dilated small bowel   Family Communication: Daughter at the bedside    Disposition: Status is: Inpatient 89 year old admitted with small bowel obstruction  Slowly improving, advancing to fulls this evening, continuing IV fluids with dextrose  due to persistent hypoglycemia  Likely will need IV fluids few more days.        Author: Lonni SHAUNNA Dalton, MD 06/18/2024 4:09 PM  For on call review [email or web address].

## 2024-06-18 NOTE — Progress Notes (Signed)
       Overnight   NAME: Adrienne [name] MRN: [id number] DOB : 1928-06-09    Date of Service   06/18/2024   HPI/Events of Note    Notified by RN for decreasing CBG values with limited intake ability. Attending notes Continue dextrose  containing fluids   Adding D5 NS at minimum rate for start.   Interventions/ Plan   D5 NS at a minimum rate, will adjust as indicated by CBG/intake ability.  Adjusted NS to maintain Attending intended IVF fluid rate  Continue all Attending orders      Lynwood Kipper BSN MSNA MSN ACNPC-AG Acute Care Nurse Practitioner [facility]

## 2024-06-18 NOTE — Plan of Care (Signed)
 9183 checked sugar, it was 65. Patient has cold and clammy hands, gave juice, MD made aware with new orders made and carried out. Rechecked sugar was 70. 1130 patient called out saying she is feeling the same symptoms yesterday when her sugar was dropping. Checked the sugar it was 96. Pt was also feeling nauseated and very anxious. MD made aware that she has no PRN for anxiety but just gave PRN for nausea. Pt felt relieved. Health education was rendered. Patient verbalized understanding. Call bell within reached. Able to tolerate clear liquid at lunch time, advanced to full liquid as per standing order for dinner. Patient had multiple BM's throughout the shift. MD aware Problem: Education: Goal: Knowledge of General Education information will improve Description: Including pain rating scale, medication(s)/side effects and non-pharmacologic comfort measures Outcome: Progressing   Problem: Health Behavior/Discharge Planning: Goal: Ability to manage health-related needs will improve Outcome: Progressing   Problem: Clinical Measurements: Goal: Ability to maintain clinical measurements within normal limits will improve Outcome: Progressing Goal: Will remain free from infection Outcome: Progressing Goal: Diagnostic test results will improve Outcome: Progressing Goal: Respiratory complications will improve Outcome: Progressing Goal: Cardiovascular complication will be avoided Outcome: Progressing   Problem: Activity: Goal: Risk for activity intolerance will decrease Outcome: Progressing   Problem: Nutrition: Goal: Adequate nutrition will be maintained Outcome: Progressing   Problem: Coping: Goal: Level of anxiety will decrease Outcome: Progressing   Problem: Elimination: Goal: Will not experience complications related to bowel motility Outcome: Progressing Goal: Will not experience complications related to urinary retention Outcome: Progressing   Problem: Pain Managment: Goal:  General experience of comfort will improve and/or be controlled Outcome: Progressing   Problem: Safety: Goal: Ability to remain free from injury will improve Outcome: Progressing   Problem: Skin Integrity: Goal: Risk for impaired skin integrity will decrease Outcome: Progressing

## 2024-06-19 ENCOUNTER — Inpatient Hospital Stay (HOSPITAL_COMMUNITY)

## 2024-06-19 DIAGNOSIS — K56609 Unspecified intestinal obstruction, unspecified as to partial versus complete obstruction: Secondary | ICD-10-CM | POA: Diagnosis not present

## 2024-06-19 LAB — CBC
HCT: 30 % — ABNORMAL LOW (ref 36.0–46.0)
Hemoglobin: 10.3 g/dL — ABNORMAL LOW (ref 12.0–15.0)
MCH: 31.7 pg (ref 26.0–34.0)
MCHC: 34.3 g/dL (ref 30.0–36.0)
MCV: 92.3 fL (ref 80.0–100.0)
Platelets: 271 K/uL (ref 150–400)
RBC: 3.25 MIL/uL — ABNORMAL LOW (ref 3.87–5.11)
RDW: 14.3 % (ref 11.5–15.5)
WBC: 10.9 K/uL — ABNORMAL HIGH (ref 4.0–10.5)
nRBC: 0 % (ref 0.0–0.2)

## 2024-06-19 LAB — APTT: aPTT: 83 s — ABNORMAL HIGH (ref 24–36)

## 2024-06-19 LAB — GLUCOSE, CAPILLARY
Glucose-Capillary: 104 mg/dL — ABNORMAL HIGH (ref 70–99)
Glucose-Capillary: 136 mg/dL — ABNORMAL HIGH (ref 70–99)
Glucose-Capillary: 71 mg/dL (ref 70–99)
Glucose-Capillary: 72 mg/dL (ref 70–99)
Glucose-Capillary: 77 mg/dL (ref 70–99)
Glucose-Capillary: 79 mg/dL (ref 70–99)
Glucose-Capillary: 83 mg/dL (ref 70–99)
Glucose-Capillary: 88 mg/dL (ref 70–99)

## 2024-06-19 LAB — COMPREHENSIVE METABOLIC PANEL WITH GFR
ALT: 15 U/L (ref 0–44)
AST: 21 U/L (ref 15–41)
Albumin: 2 g/dL — ABNORMAL LOW (ref 3.5–5.0)
Alkaline Phosphatase: 76 U/L (ref 38–126)
Anion gap: 5 (ref 5–15)
BUN: 25 mg/dL — ABNORMAL HIGH (ref 8–23)
CO2: 19 mmol/L — ABNORMAL LOW (ref 22–32)
Calcium: 7.8 mg/dL — ABNORMAL LOW (ref 8.9–10.3)
Chloride: 108 mmol/L (ref 98–111)
Creatinine, Ser: 1.09 mg/dL — ABNORMAL HIGH (ref 0.44–1.00)
GFR, Estimated: 47 mL/min — ABNORMAL LOW (ref >60)
Glucose, Bld: 141 mg/dL — ABNORMAL HIGH (ref 70–99)
Potassium: 4.1 mmol/L (ref 3.5–5.1)
Sodium: 132 mmol/L — ABNORMAL LOW (ref 135–145)
Total Bilirubin: 0.6 mg/dL (ref 0.0–1.2)
Total Protein: 4.9 g/dL — ABNORMAL LOW (ref 6.5–8.1)

## 2024-06-19 LAB — HEPARIN LEVEL (UNFRACTIONATED): Heparin Unfractionated: 0.29 [IU]/mL — ABNORMAL LOW (ref 0.30–0.70)

## 2024-06-19 MED ORDER — APIXABAN 2.5 MG PO TABS
2.5000 mg | ORAL_TABLET | Freq: Two times a day (BID) | ORAL | Status: DC
  Administered 2024-06-19 – 2024-06-20 (×2): 2.5 mg via ORAL
  Filled 2024-06-19 (×2): qty 1

## 2024-06-19 MED ORDER — CEPHALEXIN 500 MG PO CAPS: 500.0000 mg | ORAL_CAPSULE | Freq: Two times a day (BID) | ORAL | 0 refills | Status: AC

## 2024-06-19 MED ORDER — APIXABAN 2.5 MG PO TABS: 2.5000 mg | ORAL_TABLET | Freq: Two times a day (BID) | ORAL | Status: DC

## 2024-06-19 NOTE — Care Plan (Signed)
 Report given to Dianara Slade, RN @ [facility].

## 2024-06-19 NOTE — Progress Notes (Signed)
 Mobility Specialist Progress Note:    06/19/24 1041  Mobility  Activity Ambulated with assistance (In hallway)  Level of Assistance Standby assist, set-up cues, supervision of patient - no hands on  Assistive Device Front wheel walker  Distance Ambulated (ft) 200 ft  Activity Response Tolerated well  Mobility Referral Yes  Mobility visit 1 Mobility  Mobility Specialist Start Time (ACUTE ONLY) 1014  Mobility Specialist Stop Time (ACUTE ONLY) 1026  Mobility Specialist Time Calculation (min) (ACUTE ONLY) 12 min   Received pt in bed and agreeable to mobility. No physical assistance needed. No c/o. Returned to room without fault. Left pt in bed. Personal belongings and call light within reach. All needs met.  Lavanda Pollack Mobility Specialist  Please contact via [employer] or  Rehab Office [phone]

## 2024-06-19 NOTE — Discharge Summary (Signed)
 Physician Discharge Summary  Adrienne [name] FMW:[id number] DOB: 05/19/1928 DOA: 06/15/2024  PCP: Cleotilde Planas, MD  Admit date: 06/15/2024  Discharge date: 06/20/24  Admitted From: ALF  Disposition:  ALF  Recommendations for Outpatient Follow-up:  Follow up with PCP in 1-2 weeks. Please obtain BMP/CBC in one week. Advised to continue current medications as prescribed. Small bowel obstruction has resolved. Advised to take Keflex  500 mg twice daily for 7 days for UTI.  Home Health: None Equipment/Devices:None  Discharge Condition: Stable CODE STATUS:DNR Diet recommendation: Soft blend  Brief Summary / Hospital Course: Adrienne [name] is a 89 y.o. Female with severe bladder prolapse and chronic indwelling foley, chronic AF on Eliquis , diastolic CHF, pHTN, CAD, CKD IIIb baseline 1.1-1.4, and hx CVA,  admitted for small bowel obstruction.General surgery was consulted. Patient was managed conservatively,  did not require surgical intervention. She has made significant improvement.  Started on clear liquid diet,  tolerated well then advanced to soft tolerated well.  Patient was continued on heparin and subsequently patient is being discharged on Eliquis .  Patient is being discharged home, small bowel obstruction resolved.    Discharge Diagnoses:  Principal Problem:   SBO (small bowel obstruction) (HCC) Active Problems:   CKD (chronic kidney disease), stage IIIb (HCC)   Permanent atrial fibrillation (HCC)   Hyperlipidemia   History of stroke   Essential hypertension   Malnutrition of moderate degree   Chronic anticoagulation   GI bleed   Bleeding gastrointestinal   GERD (gastroesophageal reflux disease)   Cerebrovascular accident (CVA) due to embolism of left middle cerebral artery (HCC)   Malignant neoplasm of overlapping sites of right breast in female, estrogen receptor positive (HCC)   (HFpEF) heart failure with preserved ejection fraction (HCC)   Cerebrovascular  disease  Small bowel obstruction: Admitted and general surgery consulted.  No NG tube was placed, and patient did not have vomiting. Given oral Gastrografin, started to have a small liquid bowel movements.   Follow-up x-ray showed contrast to the colon.  Advanced to clears.  Did well overnight - Advance to full liquid this evening. - Continue dextrose  containing fluids. - Consult general surgery, appreciate recommendations - Continue heparin bridge for now. - Tolerated soft bland diet, small bowel obstruction resolved.     Hypoglycemia: Patient developed severe hypoglycemia on 10/6, this was treated with dextrose  and resolved.   In the last 24 hours, her sugars have remained stable with dextrose  infusion. - Tolerated soft diet. - Continue every 4 Accu-Cheks    Hypokalemia - Supplement potassium.   Normocytic anemia Mild, asymptomatic, no clinical bleeding observed or reported. - Trend Hgb   Chronic diastolic CHF (congestive heart failure) (HCC) Pulmonary hypertension Coronary artery disease -Continue heparin. - Hold Eliquis  - Hold Lipitor - Resume home metoprolol , Lasix    Bladder prolapse and chronic indwelling foley -Maintain chronic indwelling Foley.   CKD (chronic kidney disease), stage IIIb (HCC) Creatinine stable in baseline range (1.1-1.4).   Permanent atrial fibrillation (HCC) Heart rate down to the 80s at rest back on home metoprolol  - Continue home metoprolol  - Continue heparin bridge for now - Held Eliquis .  Now resume discharge      Discharge Instructions  Discharge Instructions     Call MD for:  persistant nausea and vomiting   Complete by: As directed    Call MD for:  severe uncontrolled pain   Complete by: As directed    Diet - low sodium heart healthy   Complete by: As directed  Diet general   Complete by: As directed    Discharge instructions   Complete by: As directed    Advised to follow-up with primary care physician in 1  week. Advised to continue current medications as prescribed. Small bowel obstruction is resolved.   Increase activity slowly   Complete by: As directed       Allergies as of 06/20/2024       Reactions   Valium [diazepam] Other (See Comments)   HYPERACTIVITY   Amiodarone Hcl [amiodarone] Other (See Comments)   A-Fib   Compazine [prochlorperazine] Other (See Comments)   HYPERACTIVITY    Other Other (See Comments)   Kidney dye--patient states this gave her severe shakes/chills   Thorazine [chlorpromazine] Other (See Comments)   HYPERACTIVITY    Zometa [zoledronic Acid] Other (See Comments)   PROBLEMS WITH EYES TIA   Ciprofloxacin  Swelling, Other (See Comments)   swelling in Rt.elbow   Doxycycline  Nausea Only   Zoster Vac Recomb Adjuvanted Other (See Comments)   Welt under injection site   Zoster Vaccine Recombinant, Adjuvanted Other (See Comments)   Other Reaction(s): wel under injection varicella zoster virus glycoprotein E, recombinant   Iodinated Contrast Media Anxiety, Other (See Comments)   Chills and anxiety Other Reaction(s): chills/shaking, Kidney dye        Medication List     TAKE these medications    acetaminophen  500 MG tablet Commonly known as: TYLENOL  Take 1,000 mg by mouth every 12 (twelve) hours as needed (pain). What changed: Another medication with the same name was added. Make sure you understand how and when to take each.   acetaminophen  325 MG tablet Commonly known as: TYLENOL  Take 2 tablets (650 mg total) by mouth every 6 (six) hours as needed for up to 7 days for mild pain (pain score 1-3) (or Fever >/= 101). What changed: You were already taking a medication with the same name, and this prescription was added. Make sure you understand how and when to take each.   albuterol  108 (90 Base) MCG/ACT inhaler Commonly known as: Ventolin  HFA Inhale 1 puff into the lungs every 6 (six) hours as needed for wheezing or shortness of breath (Cough).    allopurinol  100 MG tablet Commonly known as: ZYLOPRIM  Take 1 tablet (100 mg total) by mouth daily.   ascorbic acid  500 MG tablet Commonly known as: VITAMIN C  Take 500 mg by mouth 2 (two) times daily.   atorvastatin  10 MG tablet Commonly known as: LIPITOR Take 1 tablet (10 mg total) by mouth daily. What changed: when to take this   calcium  carbonate 1500 (600 Ca) MG Tabs tablet Commonly known as: OSCAL Take 1 tablet by mouth 2 (two) times daily with a meal.   Centrum Silver Adult 50+ Tabs Take 1 tablet by mouth daily.   cephALEXin  500 MG capsule Commonly known as: KEFLEX  Take 1 capsule (500 mg total) by mouth 2 (two) times daily for 7 days.   cyanocobalamin  1000 MCG tablet Commonly known as: VITAMIN B12 Take 1,000 mcg by mouth daily.   docusate sodium  100 MG capsule Commonly known as: COLACE Take 100 mg by mouth daily as needed (constipation).   Eliquis  2.5 MG Tabs tablet Generic drug: apixaban  Take 2.5 mg by mouth 2 (two) times daily.   furosemide  40 MG tablet Commonly known as: LASIX  Take 1 tablet (40 mg total) by mouth daily. What changed:  when to take this additional instructions   Iron  325 (65 Fe) MG Tabs Take 325  mg by mouth daily.   metoprolol  tartrate 100 MG tablet Commonly known as: LOPRESSOR  Take 1 tablet (100 mg total) by mouth 2 (two) times daily.   mineral oil-hydrophilic petrolatum ointment Apply topically as needed for dry skin. What changed:  how much to take when to take this additional instructions   omeprazole 10 MG capsule Commonly known as: PRILOSEC Take 10 mg by mouth every other day.   ondansetron  4 MG tablet Commonly known as: ZOFRAN  Take 1 tablet (4 mg total) by mouth every 8 (eight) hours as needed for up to 5 days for nausea.   OXYGEN  Inhale 2 L/min into the lungs at bedtime. via [place]   polyethylene glycol 17 g packet Commonly known as: MIRALAX  / GLYCOLAX  Take 17 g by mouth at bedtime.   potassium chloride  SA 20 MEQ  tablet Commonly known as: KLOR-CON  M Take 1 tablet (20 mEq total) by mouth daily.   pyridOXINE 50 MG tablet Commonly known as: B-6 1 tablet Orally   UP4 PROBIOTICS WOMENS PO Take 1 capsule by mouth daily with breakfast.   Vitamin B Complex  Caps Take 1 capsule by mouth daily.         Follow-up Information     Cleotilde Planas, MD Follow up in 1 week(s).   Specialty: Family Medicine Contact information: [address] [place] [address] [phone]                Allergies  Allergen Reactions   Valium [Diazepam] Other (See Comments)    HYPERACTIVITY    Amiodarone Hcl [Amiodarone] Other (See Comments)    A-Fib   Compazine [Prochlorperazine] Other (See Comments)    HYPERACTIVITY    Other Other (See Comments)    Kidney dye--patient states this gave her severe shakes/chills   Thorazine [Chlorpromazine] Other (See Comments)    HYPERACTIVITY    Zometa [Zoledronic Acid] Other (See Comments)    PROBLEMS WITH EYES  TIA   Ciprofloxacin  Swelling and Other (See Comments)    swelling in Rt.elbow   Doxycycline  Nausea Only   Zoster Vac Recomb Adjuvanted Other (See Comments)    Welt under injection site   Zoster Vaccine Recombinant, Adjuvanted Other (See Comments)    Other Reaction(s): wel under injection  varicella zoster virus glycoprotein E, recombinant   Iodinated Contrast Media Anxiety and Other (See Comments)    Chills and anxiety  Other Reaction(s): chills/shaking, Kidney dye    Consultations: General Surgery   Procedures/Studies: DG Abd Portable 1V Result Date: 06/19/2024 CLINICAL DATA:  [id number] SBO (small bowel obstruction) (HCC) [id number] EXAM: PORTABLE ABDOMEN - 1 VIEW COMPARISON:  06/17/2024, 06/15/2024 FINDINGS: Gaseous distention of the stomach. Gas-filled segments of bowel in the upper abdomen, likely decompressed transverse colon.The remainder of the small bowel is gas-filled and decompressed.No pneumoperitoneum. No organomegaly or  radiopaque calculi. No acute fracture or destructive lesion. The lung bases are clear. IMPRESSION: Gaseous distension of the stomach and transverse colon. Otherwise, the remainder of the small bowel is gas-filled, but nondistended. Electronically Signed   By: Rogelia Myers M.D.   On: 06/19/2024 18:13   DG Abd Portable 1V-Small Bowel Obstruction Protocol-initial, 8 hr delay Result Date: 06/17/2024 CLINICAL DATA:  Small bowel obstruction EXAM: PORTABLE ABDOMEN - 1 VIEW COMPARISON:  06/16/2024 FINDINGS: Contrast material is demonstrated within distal decompressed small bowel and within the colon to the rectum. This suggests no evidence of complete small bowel obstruction. Gaseous distention of mid abdominal small bowel is again demonstrated. Degenerative changes in the  spine. IMPRESSION: Contrast material is demonstrated throughout the colon suggesting no evidence of complete small bowel obstruction. Gaseous distention of mid abdominal small bowel may indicate partial obstruction. Electronically Signed   By: Elsie Gravely M.D.   On: 06/17/2024 21:21   DG Abd Portable 1V Result Date: 06/16/2024 CLINICAL DATA:  Small-bowel obstruction. EXAM: PORTABLE ABDOMEN - 1 VIEW COMPARISON:  CT abdomen pelvis 06/15/2024 FINDINGS: There is evidence of some persistent dilated central small bowel loops measuring up to 4.7 cm. Air present in the colon. Findings are consistent with partial small bowel obstruction. No evidence of free intraperitoneal air. No abnormal calcifications. Diffuse degenerative disease of the spine. IMPRESSION: Persistent partial small bowel obstruction. Electronically Signed   By: Marcey Moan M.D.   On: 06/16/2024 09:51   CT ABDOMEN PELVIS WO CONTRAST Addendum Date: 06/15/2024 ADDENDUM REPORT: 06/15/2024 15:13 ADDENDUM: These results were called by telephone at the time of interpretation on 06/15/2024 at 3:13 pm to provider RILEY RANSOM , who verbally acknowledged these results. Electronically  Signed   By: Maude Naegeli M.D.   On: 06/15/2024 15:13   Result Date: 06/15/2024 CLINICAL DATA:  Bowel obstruction suspected EXAM: CT ABDOMEN AND PELVIS WITHOUT CONTRAST TECHNIQUE: Multidetector CT imaging of the abdomen and pelvis was performed following the standard protocol without IV contrast. RADIATION DOSE REDUCTION: This exam was performed according to the departmental dose-optimization program which includes automated exposure control, adjustment of the mA and/or kV according to patient size and/or use of iterative reconstruction technique. COMPARISON:  CT of the abdomen and pelvis performed May 26, 2024 FINDINGS: Lower chest: No acute abnormality.  Heart is enlarged. Hepatobiliary: Small volume of perihepatic ascites. The gallbladder is surgically absent. Pancreas: Unchanged. Spleen: Unchanged. Adrenals/Urinary Tract: The adrenal glands are unchanged. The right kidney is within normal limits and there is no evidence of hydronephrosis. The left renal collecting system and left ureter are dilated, similar when compared to the previous examination. Similar small left renal calculus. An indwelling urinary catheter is present. The bladder is decompressed. Stomach/Bowel: The stomach is nondilated. The proximal duodenal is mildly dilated and fluid filled, similar when compared to May 26, 2024. There is increasing dilatation and inflammatory change involving the proximal jejunum in the left abdomen. An area of abnormally dilated small bowel with associated air-fluid levels is annotated. Just proximal to this dilatation there is significant inflammatory change and likely wall thickening. In the mid abdomen on image 42 of CT series 4, there is swirling of the central mesentery as well as a loop of jejunum with a transition point at that site, likely resulting in obstruction (or partial obstruction). The bowel distal to this segment is less nondilated. No evidence of free intraperitoneal air. Extensive  colonic diverticulosis. A small rectal stool ball is present. Vascular/Lymphatic: No abdominal aortic aneurysm. Atherosclerotic changes are present in the abdominal aorta and major branch vessels. Reproductive: Uterus and bilateral adnexa are unremarkable. Other: Surgical clips are present in the right abdomen. Musculoskeletal: Degenerative changes in the imaged osseous structures. IMPRESSION: 1. Abnormal appearance of the mid small bowel with rotation of the mesentery resulting in obstruction or partial obstruction of mid jejunum and associated proximal bowel dilatation with significant inflammatory change. 2. Similar appearance of left-sided hydronephrosis and dilated ureter. Electronically Signed: By: Maude Naegeli M.D. On: 06/15/2024 14:58   CT Renal Stone Study Result Date: 05/26/2024 CLINICAL DATA:  Abdominal and flank pain EXAM: CT ABDOMEN AND PELVIS WITHOUT CONTRAST TECHNIQUE: Multidetector CT imaging of the abdomen and  pelvis was performed following the standard protocol without IV contrast. RADIATION DOSE REDUCTION: This exam was performed according to the departmental dose-optimization program which includes automated exposure control, adjustment of the mA and/or kV according to patient size and/or use of iterative reconstruction technique. COMPARISON:  CT abdomen and pelvis 06/03/2023. FINDINGS: Lower chest: No acute abnormality.  Mixed healed to [place] Hepatobiliary: No focal liver abnormality is seen. No gallstones, gallbladder wall thickening, or biliary dilatation. Pancreas: Unremarkable. No pancreatic ductal dilatation or surrounding inflammatory changes. Spleen: Normal in size without focal abnormality. Adrenals/Urinary Tract: Bilateral adrenal glands and right kidney are within normal limits. There are punctate left renal calculi measuring 1 mm. There is moderate severe hydroureteronephrosis to the level of prolapse ureter. No definitive ureteral calculus identified. Appearance is unchanged from  prior. Foley catheter decompresses the bladder. Stomach/Bowel: Stomach is within normal limits. No evidence of bowel wall thickening, distention, or inflammatory changes. The appendix is not seen. There are scattered air-fluid levels throughout small bowel and colon and within the stomach. There is sigmoid and descending colon diverticulosis. Vascular/Lymphatic: There are extensive atherosclerotic calcifications throughout aorta, iliac arteries and branch vessels. Aorta and IVC are normal in size. No enlarged lymph nodes are seen. Reproductive: Status post hysterectomy. No adnexal masses. Other: There is marked pelvic floor relaxation as seen on prior. There is no ascites. There is a small fat containing umbilical hernia. Musculoskeletal: Degenerative changes affect the spine. IMPRESSION: 1. Stable moderate to severe left hydroureteronephrosis to the level of the prolapsed ureter. No ureteral calculus identified. 2. Nonobstructing left renal calculi. 3. Scattered air-fluid levels throughout the stomach, small bowel and colon may represent ileus or gastroenteritis. 4. Colonic diverticulosis. 5. Marked pelvic floor relaxation. 6. Aortic atherosclerosis. Aortic Atherosclerosis (ICD10-I70.0). Electronically Signed   By: Greig Pique M.D.   On: 05/26/2024 21:34     Subjective: Patient was seen and examined at bedside.Overnight events noted. Patient reports feeling much improved. She is able to pass flatus and has bowel movement.   She tolerated soft diet and wants to be discharged home.  Discharge Exam: Vitals:   06/20/24 0612 06/20/24 1020  BP: 121/61 (!) 110/50  Pulse: 92 85  Resp: 16 18  Temp: 97.8 F (36.6 C) 98.6 F (37 C)  SpO2: 98% 97%   Vitals:   06/19/24 2213 06/20/24 0612 06/20/24 0806 06/20/24 1020  BP: (!) 121/59 121/61  (!) 110/50  Pulse: 88 92  85  Resp: 18 16  18   Temp: (!) 97.4 F (36.3 C) 97.8 F (36.6 C)  98.6 F (37 C)  TempSrc: Oral Oral    SpO2: 96% 98%  97%  Weight:    45.6 kg   Height:   5' (1.524 m)     General: Pt is alert, awake, not in acute distress Cardiovascular: RRR, S1/S2 +, no rubs, no gallops Respiratory: CTA bilaterally, no wheezing, no rhonchi Abdominal: Soft, NT, ND, bowel sounds + Extremities: no edema, no cyanosis    The results of significant diagnostics from this hospitalization (including imaging, microbiology, ancillary and laboratory) are listed below for reference.     Microbiology: Recent Results (from the past 240 hours)  Urine Culture     Status: Abnormal   Collection Time: 06/15/24  1:26 PM   Specimen: Urine, Catheterized  Result Value Ref Range Status   Specimen Description   Final    URINE, CATHETERIZED Performed at [facility], [address], [place], [place] [address]    Special Requests  Final    NONE Performed at [facility], [address], [place], [place] [address]    Culture (A)  Final    >=100,000 COLONIES/mL MULTIPLE SPECIES PRESENT, SUGGEST RECOLLECTION   Report Status 06/17/2024 FINAL  Final     Labs: BNP (last 3 results) No results for input(s): BNP in the last 8760 hours. Basic Metabolic Panel: Recent Labs  Lab 06/15/24 1323 06/15/24 1924 06/17/24 1017 06/18/24 0617 06/19/24 0046  NA 137  --  140 137 132*  K 5.0  --  3.7 3.0* 4.1  CL 102  --  107 107 108  CO2 25  --  21* 21* 19*  GLUCOSE 83  --  37* 70 141*  BUN 33*  --  41* 36* 25*  CREATININE 1.44* 1.59* 1.45* 1.25* 1.09*  CALCIUM  9.4  --  8.0* 7.9* 7.8*  MG  --   --   --  1.9  --    Liver Function Tests: Recent Labs  Lab 06/15/24 1323 06/17/24 1017 06/19/24 0046  AST 27 22 21   ALT 20 15 15   ALKPHOS 104 101 76  BILITOT 1.1 1.6* 0.6  PROT 6.1* 5.4* 4.9*  ALBUMIN 3.3* 2.2* 2.0*   Recent Labs  Lab 06/15/24 1323  LIPASE 48   No results for input(s): AMMONIA in the last 168 hours. CBC: Recent Labs  Lab 06/15/24 1323 06/15/24 1924 06/16/24 0406 06/17/24 1017  06/18/24 0617 06/19/24 0046 06/20/24 0451  WBC 10.5   < > 14.3* 13.8* 11.4* 10.9* 9.0  NEUTROABS 9.0*  --   --   --   --   --   --   HGB 12.4   < > 11.7* 11.1* 10.7* 10.3* 10.8*  HCT 37.2   < > 34.1* 33.6* 31.8* 30.0* 31.2*  MCV 93.9   < > 90.7 93.6 93.5 92.3 92.6  PLT 302   < > 300 308 257 271 236   < > = values in this interval not displayed.   Cardiac Enzymes: No results for input(s): CKTOTAL, CKMB, CKMBINDEX, TROPONINI in the last 168 hours. BNP: Invalid input(s): POCBNP CBG: Recent Labs  Lab 06/19/24 2004 06/19/24 2331 06/20/24 0424 06/20/24 0806 06/20/24 1239  GLUCAP 83 88 81 93 104*   D-Dimer No results for input(s): DDIMER in the last 72 hours. Hgb A1c No results for input(s): HGBA1C in the last 72 hours. Lipid Profile No results for input(s): CHOL, HDL, LDLCALC, TRIG, CHOLHDL, LDLDIRECT in the last 72 hours. Thyroid  function studies No results for input(s): TSH, T4TOTAL, T3FREE, THYROIDAB in the last 72 hours.  Invalid input(s): FREET3 Anemia work up No results for input(s): VITAMINB12, FOLATE, FERRITIN, TIBC, IRON , RETICCTPCT in the last 72 hours. Urinalysis    Component Value Date/Time   COLORURINE YELLOW 06/15/2024 1326   APPEARANCEUR CLOUDY (A) 06/15/2024 1326   LABSPEC 1.014 06/15/2024 1326   PHURINE 5.0 06/15/2024 1326   GLUCOSEU NEGATIVE 06/15/2024 1326   HGBUR MODERATE (A) 06/15/2024 1326   BILIRUBINUR NEGATIVE 06/15/2024 1326   BILIRUBINUR n 04/14/2020 1705   KETONESUR NEGATIVE 06/15/2024 1326   PROTEINUR NEGATIVE 06/15/2024 1326   UROBILINOGEN 0.2 04/14/2020 1705   NITRITE NEGATIVE 06/15/2024 1326   LEUKOCYTESUR LARGE (A) 06/15/2024 1326   Sepsis Labs Recent Labs  Lab 06/17/24 1017 06/18/24 0617 06/19/24 0046 06/20/24 0451  WBC 13.8* 11.4* 10.9* 9.0   Microbiology Recent Results (from the past 240 hours)  Urine Culture     Status: Abnormal   Collection Time:  06/15/24  1:26 PM    Specimen: Urine, Catheterized  Result Value Ref Range Status   Specimen Description   Final    URINE, CATHETERIZED Performed at [facility], [address] [place], [place] [address]    Special Requests   Final    NONE Performed at [facility], [address] [place], [place] [address]    Culture (A)  Final    >=100,000 COLONIES/mL MULTIPLE SPECIES PRESENT, SUGGEST RECOLLECTION   Report Status 06/17/2024 FINAL  Final     Time coordinating discharge: Over 30 minutes  SIGNED:   Darcel Dawley, MD  [facility] 06/20/2024, 1:33 PM Pager   If 7PM-7AM, please contact night-coverage

## 2024-06-19 NOTE — Progress Notes (Signed)
   Subjective/Chief Complaint: Feels much better, tolerating full liquids Reports multiple bowel movements overnight Her daughter is at the bedside Patient tells me she lives at [facility] assisted living with her 88 year old husband Objective: Vital signs in last 24 hours: Temp:  [97.5 F (36.4 C)-98 F (36.7 C)] 97.5 F (36.4 C) (10/08 0958) Pulse Rate:  [73-83] 75 (10/08 0958) Resp:  [16-18] 18 (10/08 0958) BP: (107-117)/(57-74) 107/61 (10/08 0958) SpO2:  [98 %-100 %] 98 % (10/08 0522) Last BM Date : 06/19/24  Intake/Output from previous day: 10/07 0701 - 10/08 0700 In: 1998.6 [P.O.:1080; I.V.:918.6] Out: 275 [Urine:275] Intake/Output this shift: No intake/output data recorded.  Ab soft nontender mild lower abdominal distention Foley draining clear yellow urine  Lab Results:  Recent Labs    06/18/24 0617 06/19/24 0046  WBC 11.4* 10.9*  HGB 10.7* 10.3*  HCT 31.8* 30.0*  PLT 257 271   BMET Recent Labs    06/18/24 0617 06/19/24 0046  NA 137 132*  K 3.0* 4.1  CL 107 108  CO2 21* 19*  GLUCOSE 70 141*  BUN 36* 25*  CREATININE 1.25* 1.09*  CALCIUM  7.9* 7.8*   PT/INR No results for input(s): LABPROT, INR in the last 72 hours. ABG No results for input(s): PHART, HCO3 in the last 72 hours.  Invalid input(s): PCO2, PO2  Studies/Results: DG Abd Portable 1V-Small Bowel Obstruction Protocol-initial, 8 hr delay Result Date: 06/17/2024 CLINICAL DATA:  Small bowel obstruction EXAM: PORTABLE ABDOMEN - 1 VIEW COMPARISON:  06/16/2024 FINDINGS: Contrast material is demonstrated within distal decompressed small bowel and within the colon to the rectum. This suggests no evidence of complete small bowel obstruction. Gaseous distention of mid abdominal small bowel is again demonstrated. Degenerative changes in the spine. IMPRESSION: Contrast material is demonstrated throughout the colon suggesting no evidence of complete small bowel obstruction. Gaseous distention  of mid abdominal small bowel may indicate partial obstruction. Electronically Signed   By: Elsie Gravely M.D.   On: 06/17/2024 21:21    Anti-infectives: Anti-infectives (From admission, onward)    None       Assessment/Plan: PSBO  -clinically and radiologically better - Advance to soft diet, okay for discharge home if tolerates We discussed dietary recommendations  FEN -soft VTE - SCDs, heparin gtt ID - None   Adrienne [name] 06/19/2024

## 2024-06-19 NOTE — TOC Transition Note (Addendum)
 Transition of Care [facility]) - Discharge Note   Patient Details  Name: Adrienne [name] MRN: [id number] Date of Birth: 01/13/1928  [facility] CM/SW Contact:  Jeoffrey LITTIE Maranda ISRAEL Phone Number: 06/19/2024, 3:01 PM   Clinical Narrative:    [facility] will [facility] to: [facility] Anticipated DC date: 06/19/24 Family notified: Yes Transport by: Daughter to transport   Per MD patient ready for DC to [facility]. RN to call report prior to discharge [phone]. RN, patient, patient's family, and facility notified of DC. Discharge Summary and FL2 sent to facility. DC packet on chart. Pt daughter to transport to facility.   CSW will sign off for now as social work intervention is no longer needed. Please consult us  again if new needs arise.     Final next level of care: Assisted Living Barriers to Discharge: Barriers Resolved   Patient Goals and CMS Choice Patient states their goals for this hospitalization and ongoing recovery are:: return to ALF          Discharge Placement              Patient chooses bed at: Other - please specify in the comment section below: [name]) Patient to be transferred to facility by: [facility] Name of family member notified: Elsie Patient and family notified of of transfer: 06/19/24  Discharge Plan and Services Additional resources added to the After Visit Summary for                                       Social Drivers of Health (SDOH) Interventions SDOH Screenings   Food Insecurity: No Food Insecurity (06/15/2024)  Housing: Low Risk  (06/15/2024)  Transportation Needs: No Transportation Needs (06/15/2024)  Utilities: Not At Risk (06/15/2024)  Depression (PHQ2-9): Low Risk  (06/12/2024)  Social Connections: Socially Isolated (06/15/2024)  Tobacco Use: Medium Risk (06/15/2024)     Readmission Risk Interventions    06/16/2023    1:21 PM 06/07/2023   10:15 AM  05/05/2023    5:19 PM  Readmission Risk Prevention Plan  Transportation Screening Complete Complete Complete  Medication Review (RN Care Manager) Complete Complete Complete  PCP or Specialist appointment within 3-5 days of discharge  Complete   HRI or Home Care Consult Complete Complete Complete  SW Recovery Care/Counseling Consult  Complete   Palliative Care Screening Not Applicable Not Applicable Not Applicable  Skilled Nursing Facility Not Applicable Not Applicable Not Applicable

## 2024-06-19 NOTE — Progress Notes (Signed)
 PHARMACY - ANTICOAGULATION  Pharmacy Consult for heparin  Indication: hx atrial fibrillation  Brief A/P: Heparin level subtherapeutic Increase Heparin rate  Allergies  Allergen Reactions   Valium [Diazepam] Other (See Comments)    HYPERACTIVITY    Amiodarone Hcl [Amiodarone] Other (See Comments)    A-Fib   Compazine [Prochlorperazine] Other (See Comments)    HYPERACTIVITY    Other Other (See Comments)    Kidney dye--patient states this gave her severe shakes/chills   Thorazine [Chlorpromazine] Other (See Comments)    HYPERACTIVITY    Zometa [Zoledronic Acid] Other (See Comments)    PROBLEMS WITH EYES  TIA   Ciprofloxacin  Swelling and Other (See Comments)    swelling in Rt.elbow   Doxycycline  Nausea Only   Zoster Vac Recomb Adjuvanted Other (See Comments)    Welt under injection site   Zoster Vaccine Recombinant, Adjuvanted Other (See Comments)    Other Reaction(s): wel under injection  varicella zoster virus glycoprotein E, recombinant   Iodinated Contrast Media Anxiety and Other (See Comments)    Chills and anxiety  Other Reaction(s): chills/shaking, Kidney dye    Patient Measurements:    Vital Signs: Temp: 97.6 F (36.4 C) (10/07 2056) Temp Source: Oral (10/07 2056) BP: 117/74 (10/07 2152) Pulse Rate: 83 (10/07 2152)  Labs: Recent Labs    06/17/24 1017 06/18/24 0617 06/18/24 1526 06/19/24 0046  HGB 11.1* 10.7*  --  10.3*  HCT 33.6* 31.8*  --  30.0*  PLT 308 257  --  271  APTT 77* 55* 49* 83*  HEPARINUNFRC 0.77* 0.32 0.18* 0.29*  CREATININE 1.45* 1.25*  --  1.09*    Estimated Creatinine Clearance: 22.8 mL/min (A) (by C-G formula based on SCr of 1.09 mg/dL (H)).  Assessment: 89 y.o. female with h/o Afib, Eliquis  on hold, for heparin  Goal of Therapy:  Heparin level 0.3-0.7 units/mL Monitor platelets by anticoagulation protocol: Yes   Plan:  Increase Heparin 1000 units/hr  Cathlyn Arrant, PharmD, BCPS  06/19/2024 2:10 AM

## 2024-06-19 NOTE — Progress Notes (Signed)
 Pt had 4 small liquid Bms overnight.

## 2024-06-19 NOTE — Plan of Care (Signed)
  Problem: Education: Goal: Knowledge of General Education information will improve Description: Including pain rating scale, medication(s)/side effects and non-pharmacologic comfort measures Outcome: Progressing   Problem: Health Behavior/Discharge Planning: Goal: Ability to manage health-related needs will improve Outcome: Progressing   Problem: Clinical Measurements: Goal: Ability to maintain clinical measurements within normal limits will improve Outcome: Progressing Goal: Diagnostic test results will improve Outcome: Progressing Goal: Respiratory complications will improve Outcome: Progressing Goal: Cardiovascular complication will be avoided Outcome: Progressing   Problem: Activity: Goal: Risk for activity intolerance will decrease Outcome: Progressing   Problem: Nutrition: Goal: Adequate nutrition will be maintained Outcome: Progressing   Problem: Coping: Goal: Level of anxiety will decrease Outcome: Progressing   Problem: Elimination: Goal: Will not experience complications related to bowel motility Outcome: Progressing Goal: Will not experience complications related to urinary retention Outcome: Progressing   Problem: Pain Managment: Goal: General experience of comfort will improve and/or be controlled Outcome: Progressing   Problem: Safety: Goal: Ability to remain free from injury will improve Outcome: Progressing   Problem: Skin Integrity: Goal: Risk for impaired skin integrity will decrease Outcome: Progressing

## 2024-06-19 NOTE — Progress Notes (Addendum)
 PHARMACY - ANTICOAGULATION  Pharmacy Consult for heparin  Indication: hx atrial fibrillation  Brief A/P: Heparin level subtherapeutic Increase Heparin rate  Allergies  Allergen Reactions   Valium [Diazepam] Other (See Comments)    HYPERACTIVITY    Amiodarone Hcl [Amiodarone] Other (See Comments)    A-Fib   Compazine [Prochlorperazine] Other (See Comments)    HYPERACTIVITY    Other Other (See Comments)    Kidney dye--patient states this gave her severe shakes/chills   Thorazine [Chlorpromazine] Other (See Comments)    HYPERACTIVITY    Zometa [Zoledronic Acid] Other (See Comments)    PROBLEMS WITH EYES  TIA   Ciprofloxacin  Swelling and Other (See Comments)    swelling in Rt.elbow   Doxycycline  Nausea Only   Zoster Vac Recomb Adjuvanted Other (See Comments)    Welt under injection site   Zoster Vaccine Recombinant, Adjuvanted Other (See Comments)    Other Reaction(s): wel under injection  varicella zoster virus glycoprotein E, recombinant   Iodinated Contrast Media Anxiety and Other (See Comments)    Chills and anxiety  Other Reaction(s): chills/shaking, Kidney dye    Patient Measurements:    Vital Signs: Temp: 97.5 F (36.4 C) (10/08 0958) Temp Source: Oral (10/08 0958) BP: 107/61 (10/08 0958) Pulse Rate: 75 (10/08 0958)  Labs: Recent Labs    06/17/24 1017 06/18/24 0617 06/18/24 1526 06/19/24 0046  HGB 11.1* 10.7*  --  10.3*  HCT 33.6* 31.8*  --  30.0*  PLT 308 257  --  271  APTT 77* 55* 49* 83*  HEPARINUNFRC 0.77* 0.32 0.18* 0.29*  CREATININE 1.45* 1.25*  --  1.09*    Estimated Creatinine Clearance: 22.8 mL/min (A) (by C-G formula based on SCr of 1.09 mg/dL (H)).  Assessment: 89 y.o. female with h/o Afib, Eliquis  on hold, for heparin  10/7 PM: pt discharge delayed d/t feeling unwell, heparin resumed and subsequently transitioned to Eliquis  as pt now able to take PO after discussion with MD.   Goal of Therapy:  Heparin level 0.3-0.7  units/mL Monitor platelets by anticoagulation protocol: Yes   Plan:  STOP heparin gtt at 2000 START Eliquis  2.5 BID per PTA regimen- administer first dose at same time heparin infusion is stopped CBC in AM   Massie Fila, PharmD Clinical Pharmacist  06/19/2024 4:27 PM

## 2024-06-19 NOTE — [place] FL2 (Signed)
 [place]  MEDICAID FL2 LEVEL OF CARE FORM     IDENTIFICATION  Patient Name: Adrienne [name] Birthdate: May 09, 1928 Sex: female Admission Date (Current Location): 06/15/2024  [facility] and [place] Number:  Producer, television/film/video and Address:  [facility] [place], [address], [place], [place] [address]      Provider Number: [id number]  Attending Physician Name and Address:  Leotis Bogus, MD  Relative Name and Phone Number:       Current Level of Care: Hospital Recommended Level of Care: Assisted Living Facility Prior Approval Number:    Date Approved/Denied:   PASRR Number:    Discharge Plan: Other (Comment) [facility])    Current Diagnoses: Patient Active Problem List   Diagnosis Date Noted   SBO (small bowel obstruction) (HCC) 06/15/2024   UTI (urinary tract infection) 06/03/2023   Cerebrovascular disease 05/05/2023   Hypokalemia 05/04/2023   Normocytic anemia 05/04/2023   Mitral regurgitation 04/20/2023   Hydronephrosis of left kidney 03/24/2023   CKD (chronic kidney disease), stage IIIb (HCC) 03/23/2023   UTI (urinary tract infection) due to urinary indwelling catheter 02/04/2023   Hyponatremia 12/16/2022   Acute cystitis with hydronephrosis due to indwelling foley catheter 12/15/2022   CAP (community acquired pneumonia) 09/03/2022   Catheter-associated urinary tract infection 09/03/2022   Open knee wound 10/05/2021   (HFpEF) heart failure with preserved ejection fraction (HCC) 08/18/2021   Palliative care by specialist    Goals of care, counseling/discussion    DNR (do not resuscitate) discussion    Vaginal bleeding 08/08/2020   Uterine prolapse 08/08/2020   Vaginal erosion secondary to pessary use 08/08/2020   Acute urinary retention 08/08/2020   Chronic respiratory failure (HCC) 08/08/2020   Multiple pulmonary nodules 05/24/2020   Cough 05/24/2020   Malignant neoplasm of upper-inner quadrant of left breast in  female, estrogen receptor negative (HCC) 12/20/2019   Malignant neoplasm of overlapping sites of right breast in female, estrogen receptor positive (HCC) 08/14/2019   Osteopenia 08/14/2019   Diplopia 01/29/2016   GERD (gastroesophageal reflux disease) 01/29/2016   Cerebrovascular accident (CVA) due to embolism of left middle cerebral artery (HCC) 01/29/2016   Bleeding gastrointestinal    Gait disturbance, post-stroke 01/27/2016   Fall    Secondary hypertension, unspecified    Cerebral infarction due to thrombosis of left middle cerebral artery (HCC) s/p mechanical thrombectomy    Malnutrition of moderate degree 01/09/2016   GI bleed 01/06/2016   Permanent atrial fibrillation (HCC) 06/10/2015   Chronic anticoagulation 06/10/2015   Hyperlipidemia 06/10/2015   Essential hypertension 06/10/2015   History of stroke 06/10/2015   Coronary artery disease due to lipid rich plaque 06/10/2015   Elevated uric acid in blood    Arthritis of ankle    Pulmonary HTN (HCC)     Orientation RESPIRATION BLADDER Height & Weight     Self, Time, Situation, Place  Normal Incontinent Weight:   Height:     BEHAVIORAL SYMPTOMS/MOOD NEUROLOGICAL BOWEL NUTRITION STATUS      Continent Diet (See DC Summary)  AMBULATORY STATUS COMMUNICATION OF NEEDS Skin     Verbally Normal                       Personal Care Assistance Level of Assistance              Functional Limitations Info  Sight, Speech, Hearing Sight Info: Impaired Hearing Info: Impaired Speech Info: Adequate    SPECIAL CARE FACTORS FREQUENCY  Contractures      Additional Factors Info  Code Status, Allergies Code Status Info: DNR-Limited             Current Medications (06/19/2024):  This is the current hospital active medication list Current Facility-Administered Medications  Medication Dose Route Frequency Provider Last Rate Last Admin   acetaminophen  (TYLENOL ) tablet 650 mg  650 mg Oral Q6H  PRN Lue Elsie BROCKS, MD       Or   acetaminophen  (TYLENOL ) suppository 650 mg  650 mg Rectal Q6H PRN Lue Elsie BROCKS, MD       allopurinol  (ZYLOPRIM ) tablet 100 mg  100 mg Oral Daily Jonel Lonni SQUIBB, MD   100 mg at 06/19/24 9066   Chlorhexidine  Gluconate Cloth 2 % PADS 6 each  6 each Topical Daily Lue Elsie BROCKS, MD   6 each at 06/19/24 0934   feeding supplement (BOOST / RESOURCE BREEZE) liquid 1 Container  1 Container Oral TID BM Jonel Lonni SQUIBB, MD   237 mL at 06/19/24 0933   furosemide  (LASIX ) tablet 40 mg  40 mg Oral Daily Danford, Christopher P, MD   40 mg at 06/19/24 0933   heparin ADULT infusion 100 units/mL (25000 units/250mL)  1,000 Units/hr Intravenous Continuous Jonel Lonni SQUIBB, MD 10 mL/hr at 06/19/24 0455 1,000 Units/hr at 06/19/24 0455   HYDROmorphone (DILAUDID) injection 0.5 mg  0.5 mg Intravenous Q3H PRN Lue Elsie BROCKS, MD       metoprolol  tartrate (LOPRESSOR ) injection 5 mg  5 mg Intravenous Q6H PRN Lue Elsie BROCKS, MD       metoprolol  tartrate (LOPRESSOR ) tablet 100 mg  100 mg Oral BID Jonel Lonni SQUIBB, MD   100 mg at 06/19/24 0932   ondansetron  (ZOFRAN ) tablet 4 mg  4 mg Oral Q6H PRN Lue Elsie BROCKS, MD       Or   ondansetron  (ZOFRAN ) injection 4 mg  4 mg Intravenous Q6H PRN Lue Elsie BROCKS, MD   4 mg at 06/18/24 1143   oxyCODONE  (Oxy IR/ROXICODONE ) immediate release tablet 5 mg  5 mg Oral Q4H PRN Lue Elsie BROCKS, MD   5 mg at 06/18/24 2308   pantoprazole  (PROTONIX ) EC tablet 40 mg  40 mg Oral Daily Danford, Christopher P, MD   40 mg at 06/19/24 0933   traZODone (DESYREL) tablet 25 mg  25 mg Oral QHS PRN Lue Elsie BROCKS, MD         Discharge Medications: Please see discharge summary for a list of discharge medications.  Relevant Imaging Results:  Relevant Lab Results:   Additional Information Valium (diazepam); Compazine (Prochlorperazine); Thorazine (Chlorpromazine); Zometa (Zoledronic acid);  Ciprofloxacin ; Doxycycline ; Zoster vaccine recombinant, adjuvanated; Iodinated contrast media  Addylynn Balin L Chason Mciver, LCSWA

## 2024-06-19 NOTE — Discharge Instructions (Signed)
 Advised to continue current medications as prescribed. Small bowel obstruction is resolved.

## 2024-06-19 NOTE — Care Plan (Addendum)
 Patient hasn't feeling well, nauseated and feeling that she want to go to the bathroom. Patient verbalized that she is nervous to be discharged today thinking that she might return back to hospital after being discharged. MD made aware with new orders carried out. PRN IV zofran  was given. Patient was relieved with zofran  and now positioned comfortable. Discharge has been cancelled. CM and [facility] facility made aware. Called pharmacist abt restarting heparin. Call bell within reached. Bed alarm on

## 2024-06-19 NOTE — Plan of Care (Signed)

## 2024-06-20 ENCOUNTER — Other Ambulatory Visit: Payer: Self-pay | Admitting: Family Medicine

## 2024-06-20 ENCOUNTER — Other Ambulatory Visit (HOSPITAL_COMMUNITY): Payer: Self-pay

## 2024-06-20 DIAGNOSIS — K56609 Unspecified intestinal obstruction, unspecified as to partial versus complete obstruction: Secondary | ICD-10-CM | POA: Diagnosis not present

## 2024-06-20 LAB — CBC
HCT: 31.2 % — ABNORMAL LOW (ref 36.0–46.0)
Hemoglobin: 10.8 g/dL — ABNORMAL LOW (ref 12.0–15.0)
MCH: 32 pg (ref 26.0–34.0)
MCHC: 34.6 g/dL (ref 30.0–36.0)
MCV: 92.6 fL (ref 80.0–100.0)
Platelets: 236 K/uL (ref 150–400)
RBC: 3.37 MIL/uL — ABNORMAL LOW (ref 3.87–5.11)
RDW: 14.5 % (ref 11.5–15.5)
WBC: 9 K/uL (ref 4.0–10.5)
nRBC: 0 % (ref 0.0–0.2)

## 2024-06-20 LAB — GLUCOSE, CAPILLARY
Glucose-Capillary: 81 mg/dL (ref 70–99)
Glucose-Capillary: 93 mg/dL (ref 70–99)
Glucose-Capillary: 104 mg/dL — ABNORMAL HIGH (ref 70–99)

## 2024-06-20 MED ORDER — ONDANSETRON HCL 4 MG PO TABS
4.0000 mg | ORAL_TABLET | Freq: Three times a day (TID) | ORAL | 0 refills | Status: AC | PRN
  Filled 2024-06-20: qty 15, 5d supply, fill #0

## 2024-06-20 MED ORDER — ACETAMINOPHEN 325 MG PO TABS: 650.0000 mg | ORAL_TABLET | Freq: Four times a day (QID) | ORAL | Status: AC | PRN

## 2024-06-20 NOTE — Progress Notes (Signed)
   Subjective/Chief Complaint: Had some abdominal pain in the afternoon yesterday, as well as this morning. No nausea vomiting. Pain occurred around lunchtime yesterday, but she was able to eat dinner. Woke up feeling yucky but improved with some nausea medicine and time, waiting on breakfast. +flatus. BM x2 last 24h Objective: Vital signs in last 24 hours: Temp:  [97.4 F (36.3 C)-98.6 F (37 C)] 98.6 F (37 C) (10/09 1020) Pulse Rate:  [46-92] 85 (10/09 1020) Resp:  [16-18] 18 (10/09 1020) BP: (110-121)/(50-87) 110/50 (10/09 1020) SpO2:  [96 %-98 %] 97 % (10/09 1020) Weight:  [45.6 kg] 45.6 kg (10/09 0806) Last BM Date : 06/19/24  Intake/Output from previous day: 10/08 0701 - 10/09 0700 In: -  Out: 2301 [Urine:2300; Stool:1] Intake/Output this shift: No intake/output data recorded.  Ab soft nontender mild abdominal distention Foley draining clear yellow urine  Lab Results:  Recent Labs    06/19/24 0046 06/20/24 0451  WBC 10.9* 9.0  HGB 10.3* 10.8*  HCT 30.0* 31.2*  PLT 271 236   BMET Recent Labs    06/18/24 0617 06/19/24 0046  NA 137 132*  K 3.0* 4.1  CL 107 108  CO2 21* 19*  GLUCOSE 70 141*  BUN 36* 25*  CREATININE 1.25* 1.09*  CALCIUM  7.9* 7.8*   PT/INR No results for input(s): LABPROT, INR in the last 72 hours. ABG No results for input(s): PHART, HCO3 in the last 72 hours.  Invalid input(s): PCO2, PO2  Studies/Results: DG Abd Portable 1V Result Date: 06/19/2024 CLINICAL DATA:  [id number] SBO (small bowel obstruction) (HCC) [id number] EXAM: PORTABLE ABDOMEN - 1 VIEW COMPARISON:  06/17/2024, 06/15/2024 FINDINGS: Gaseous distention of the stomach. Gas-filled segments of bowel in the upper abdomen, likely decompressed transverse colon.The remainder of the small bowel is gas-filled and decompressed.No pneumoperitoneum. No organomegaly or radiopaque calculi. No acute fracture or destructive lesion. The lung bases are clear. IMPRESSION: Gaseous  distension of the stomach and transverse colon. Otherwise, the remainder of the small bowel is gas-filled, but nondistended. Electronically Signed   By: Rogelia Myers M.D.   On: 06/19/2024 18:13    Anti-infectives: Anti-infectives (From admission, onward)    Start     Dose/Rate Route Frequency Ordered Stop   06/19/24 0000  cephALEXin  (KEFLEX ) 500 MG capsule        500 mg Oral 2 times daily 06/19/24 1321 06/26/24 2359       Assessment/Plan: PSBO  -clinically and radiologically better - Advance to soft diet, okay for discharge home. We discussed eating small, frequent meals and chewing her food well.  FEN -soft VTE - SCDs, heparin gtt ID - None   Adrienne [name] 06/20/2024

## 2024-06-20 NOTE — Plan of Care (Signed)

## 2024-06-20 NOTE — Progress Notes (Signed)
 PROGRESS NOTE    Adrienne [name]  FMW:[id number] DOB: 07-17-1928 DOA: 06/15/2024 PCP: Cleotilde Planas, MD   Brief Narrative:  Adrienne [name] is a 89 y.o. Female with severe bladder prolapse and chronic indwelling foley, chronic AF on Eliquis , diastolic CHF, pHTN, CAD, CKD IIIb baseline 1.1-1.4, and hx CVA,  admitted for small bowel obstruction.General surgery was consulted. Patient was managed conservatively,  did not require surgical intervention. She has made significant improvement.  Started on clear liquid diet,  tolerated well then advanced to soft tolerated well.  Patient was continued on heparin and subsequently patient is being discharged on Eliquis .  Patient is being discharged home, small bowel obstruction resolved. Patient has recurrence of abdominal pain, discharged held and then next day felt better and she wants to be discharged home.   Assessment & Plan:   Principal Problem:   SBO (small bowel obstruction) (HCC) Active Problems:   CKD (chronic kidney disease), stage IIIb (HCC)   Permanent atrial fibrillation (HCC)   Hyperlipidemia   History of stroke   Essential hypertension   Malnutrition of moderate degree   Chronic anticoagulation   GI bleed   Bleeding gastrointestinal   GERD (gastroesophageal reflux disease)   Cerebrovascular accident (CVA) due to embolism of left middle cerebral artery (HCC)   Malignant neoplasm of overlapping sites of right breast in female, estrogen receptor positive (HCC)   (HFpEF) heart failure with preserved ejection fraction (HCC)   Cerebrovascular disease   Small bowel obstruction: Admitted and general surgery consulted.  No NG tube was placed, and patient did not have vomiting. Given oral Gastrografin, started to have a small liquid bowel movements.   Follow-up x-ray showed contrast to the colon.  Advanced to clears.  Did well overnight - Advance to full liquid this evening. - Continue dextrose  containing fluids. - Consult general  surgery, appreciate recommendations - Continue heparin bridge for now. - Tolerated soft bland diet, small bowel obstruction resolved.     Hypoglycemia: Patient developed severe hypoglycemia on 10/6, this was treated with dextrose  and resolved.   In the last 24 hours, her sugars have remained stable with dextrose  infusion. - Tolerated soft diet. - Continue every 4 Accu-Cheks    Hypokalemia - Supplement potassium.   Normocytic anemia Mild, asymptomatic, no clinical bleeding observed or reported. - Trend Hgb   Chronic diastolic CHF (congestive heart failure) (HCC) Pulmonary hypertension Coronary artery disease -Continue heparin. - Hold Eliquis  - Hold Lipitor - Resume home metoprolol , Lasix    Bladder prolapse and chronic indwelling foley -Maintain chronic indwelling Foley.   CKD (chronic kidney disease), stage IIIb (HCC) Creatinine stable in baseline range (1.1-1.4).   Permanent atrial fibrillation (HCC) Heart rate down to the 80s at rest back on home metoprolol  - Continue home metoprolol  - Continue heparin bridge for now - Held Eliquis .  Now resume discharge.   DVT prophylaxis: Eliquis  Code Status: DNR Family Communication: Daughter at bed side. Disposition Plan:   Patient is being discharged home today.    Consultants:  General surgery  Procedures:None  Antimicrobials:  Anti-infectives (From admission, onward)    Start     Dose/Rate Route Frequency Ordered Stop   06/19/24 0000  cephALEXin  (KEFLEX ) 500 MG capsule        500 mg Oral 2 times daily 06/19/24 1321 06/26/24 2359      Subjective: Patient was seen and examined at bedside.  Overnight events noted. Patient reports feeling better,  abdominal pain has improved.  She wants to be discharged today.  Objective: Vitals:   06/19/24 2213 06/20/24 0612 06/20/24 0806 06/20/24 1020  BP: (!) 121/59 121/61  (!) 110/50  Pulse: 88 92  85  Resp: 18 16  18   Temp: (!) 97.4 F (36.3 C) 97.8 F (36.6 C)  98.6  F (37 C)  TempSrc: Oral Oral    SpO2: 96% 98%  97%  Weight:   45.6 kg   Height:   5' (1.524 m)     Intake/Output Summary (Last 24 hours) at 06/20/2024 1351 Last data filed at 06/20/2024 0615 Gross per 24 hour  Intake --  Output 2301 ml  Net -2301 ml   Filed Weights   06/20/24 0806  Weight: 45.6 kg    Examination:  General exam: Appears calm and comfortable, not in any acute distress.  Respiratory system: Clear to auscultation. Respiratory effort normal. RR 17 Cardiovascular system: S1 & S2 heard, RRR. No JVD, murmurs, rubs, gallops or clicks. Gastrointestinal system: Abdomen is non distended, soft and non tender.  Normal bowel sounds heard. Central nervous system: Alert and oriented X 3. No focal neurological deficits. Extremities: No edema, No cyanosis, No clubbing  Skin: No rashes, lesions or ulcers Psychiatry: Judgement and insight appear normal. Mood & affect appropriate.    Data Reviewed: I have personally reviewed following labs and imaging studies  CBC: Recent Labs  Lab 06/15/24 1323 06/15/24 1924 06/16/24 0406 06/17/24 1017 06/18/24 0617 06/19/24 0046 06/20/24 0451  WBC 10.5   < > 14.3* 13.8* 11.4* 10.9* 9.0  NEUTROABS 9.0*  --   --   --   --   --   --   HGB 12.4   < > 11.7* 11.1* 10.7* 10.3* 10.8*  HCT 37.2   < > 34.1* 33.6* 31.8* 30.0* 31.2*  MCV 93.9   < > 90.7 93.6 93.5 92.3 92.6  PLT 302   < > 300 308 257 271 236   < > = values in this interval not displayed.   Basic Metabolic Panel: Recent Labs  Lab 06/15/24 1323 06/15/24 1924 06/17/24 1017 06/18/24 0617 06/19/24 0046  NA 137  --  140 137 132*  K 5.0  --  3.7 3.0* 4.1  CL 102  --  107 107 108  CO2 25  --  21* 21* 19*  GLUCOSE 83  --  37* 70 141*  BUN 33*  --  41* 36* 25*  CREATININE 1.44* 1.59* 1.45* 1.25* 1.09*  CALCIUM  9.4  --  8.0* 7.9* 7.8*  MG  --   --   --  1.9  --    GFR: Estimated Creatinine Clearance: 22.7 mL/min (A) (by C-G formula based on SCr of 1.09 mg/dL (H)). Liver  Function Tests: Recent Labs  Lab 06/15/24 1323 06/17/24 1017 06/19/24 0046  AST 27 22 21   ALT 20 15 15   ALKPHOS 104 101 76  BILITOT 1.1 1.6* 0.6  PROT 6.1* 5.4* 4.9*  ALBUMIN 3.3* 2.2* 2.0*   Recent Labs  Lab 06/15/24 1323  LIPASE 48   No results for input(s): AMMONIA in the last 168 hours. Coagulation Profile: No results for input(s): INR, PROTIME in the last 168 hours. Cardiac Enzymes: No results for input(s): CKTOTAL, CKMB, CKMBINDEX, TROPONINI in the last 168 hours. BNP (last 3 results) No results for input(s): PROBNP in the last 8760 hours. HbA1C: No results for input(s): HGBA1C in the last 72 hours. CBG: Recent Labs  Lab 06/19/24 2004 06/19/24 2331 06/20/24 0424 06/20/24 0806 06/20/24 1239  GLUCAP 83 88  81 93 104*   Lipid Profile: No results for input(s): CHOL, HDL, LDLCALC, TRIG, CHOLHDL, LDLDIRECT in the last 72 hours. Thyroid  Function Tests: No results for input(s): TSH, T4TOTAL, FREET4, T3FREE, THYROIDAB in the last 72 hours. Anemia Panel: No results for input(s): VITAMINB12, FOLATE, FERRITIN, TIBC, IRON , RETICCTPCT in the last 72 hours. Sepsis Labs: Recent Labs  Lab 06/15/24 1924 06/16/24 0926 06/16/24 1030  LATICACIDVEN 1.0 1.4 1.5    Recent Results (from the past 240 hours)  Urine Culture     Status: Abnormal   Collection Time: 06/15/24  1:26 PM   Specimen: Urine, Catheterized  Result Value Ref Range Status   Specimen Description   Final    URINE, CATHETERIZED Performed at [facility], [address], [place], [place] [address]    Special Requests   Final    NONE Performed at [facility], [address], [place], [place] [address]    Culture (A)  Final    >=100,000 COLONIES/mL MULTIPLE SPECIES PRESENT, SUGGEST RECOLLECTION   Report Status 06/17/2024 FINAL  Final    Radiology Studies: DG Abd Portable 1V Result Date: 06/19/2024 CLINICAL DATA:   [id number] SBO (small bowel obstruction) (HCC) [id number] EXAM: PORTABLE ABDOMEN - 1 VIEW COMPARISON:  06/17/2024, 06/15/2024 FINDINGS: Gaseous distention of the stomach. Gas-filled segments of bowel in the upper abdomen, likely decompressed transverse colon.The remainder of the small bowel is gas-filled and decompressed.No pneumoperitoneum. No organomegaly or radiopaque calculi. No acute fracture or destructive lesion. The lung bases are clear. IMPRESSION: Gaseous distension of the stomach and transverse colon. Otherwise, the remainder of the small bowel is gas-filled, but nondistended. Electronically Signed   By: Rogelia Myers M.D.   On: 06/19/2024 18:13   Scheduled Meds:  allopurinol   100 mg Oral Daily   apixaban   2.5 mg Oral BID   Chlorhexidine  Gluconate Cloth  6 each Topical Daily   feeding supplement  1 Container Oral TID BM   furosemide   40 mg Oral Daily   metoprolol  tartrate  100 mg Oral BID   pantoprazole   40 mg Oral Daily   Continuous Infusions:   LOS: 5 days    Time spent: 35 mins    Darcel Dawley, MD [facility]   If 7PM-7AM, please contact night-coverage

## 2024-06-20 NOTE — Plan of Care (Signed)

## 2024-06-20 NOTE — Progress Notes (Signed)
Explained discharge instructions to patient. Reviewed follow up appointment and next medication administration times. Also reviewed education. Patient verbalized having an understanding for instructions given. All belongings are in the patient's possession to include TOC meds. IV removed. No other needs verbalized. Transported downstairs for discharge.

## 2024-06-20 NOTE — Progress Notes (Incomplete)
 Nutrition Follow-up  DOCUMENTATION CODES:      INTERVENTION:  *** Encourage Po intake - On Soft diet, thin liquids  MVI with minerals daily  Ensure Plus High Protein po BID, each supplement provides 350 kcal and 20 grams of protein  NUTRITION DIAGNOSIS:     related to   as evidenced by  .  ***  GOAL:      ***  MONITOR:      REASON FOR ASSESSMENT:   Consult Assessment of nutrition requirement/status  ASSESSMENT:   89 y.o. F with severe bladder prolapse and chronic indwelling foley, CAF, CHF, HTN, CAD, CKD IIIb baseline 1.1-1.4, and hx CVA admitted for small bowel obstruction.  Patient was managed conservatively,  did not require surgical intervention. She has made significant improvement.  Started on clear liquid diet,  tolerated well then advanced to soft tolerated well.  Patient was continued on heparin and subsequently patient is being discharged on Eliquis .  Patient is being discharged home, small bowel obstruction resolved.   ***   Admit weight: 45.6 kg Current weight: ***  58.7 kg (129 lbs) in Oct 2024 to now 100 lbs,22 % in 1 year  Average Meal Intake: 10/6-10/7: 50-100% intake x 3 recorded meals  Nutritionally Relevant Medications: Scheduled Meds:  allopurinol   100 mg Oral Daily   apixaban   2.5 mg Oral BID   Chlorhexidine  Gluconate Cloth  6 each Topical Daily   feeding supplement  1 Container Oral TID BM   furosemide   40 mg Oral Daily   metoprolol  tartrate  100 mg Oral BID   pantoprazole   40 mg Oral Daily   Labs Reviewed: *** CBG ranges from ***-*** mg/dL over the last 24 hours HgbA1c ***   NUTRITION - FOCUSED PHYSICAL EXAM:  {RD Focused Exam List:21252}  Diet Order:   Diet Order             DIET SOFT Room service appropriate? Yes; Fluid consistency: Thin  Diet effective now           Diet - low sodium heart healthy           Diet general                   EDUCATION NEEDS:      Skin:     Last BM:     Height:   Ht  Readings from Last 1 Encounters:  06/20/24 5' (1.524 m)    Weight:   Wt Readings from Last 1 Encounters:  06/20/24 45.6 kg    Ideal Body Weight:     BMI:  Body mass index is 19.63 kg/m.  Estimated Nutritional Needs:   Kcal:     Protein:     Fluid:       ***

## 2024-06-20 NOTE — Progress Notes (Unsigned)
 {  Select_TRH_Note:[id number]}

## 2024-06-24 DIAGNOSIS — N390 Urinary tract infection, site not specified: Secondary | ICD-10-CM | POA: Diagnosis not present

## 2024-06-24 DIAGNOSIS — R339 Retention of urine, unspecified: Secondary | ICD-10-CM | POA: Diagnosis not present

## 2024-06-26 ENCOUNTER — Other Ambulatory Visit: Payer: Self-pay

## 2024-06-26 NOTE — Patient Outreach (Signed)
 Complex Care Management   Visit Note  06/26/2024  Name:  Adrienne [name] MRN: [id number] DOB: Jan 20, 1928  Situation: Referral received for Complex Care Management related to Heart Failure and recent ED visits/hospitalizations I obtained verbal consent from Patient.  Visit completed with Patient  on the phone  Background:   Past Medical History:  Diagnosis Date   Afib (HCC)    Arthritis of ankle    CAD (coronary artery disease)    Cancer (HCC) 2012   breast cancer-right   Compression fracture of T12 vertebra (HCC)    Congestive heart disease (HCC)    Diverticulosis    Edema    Elevated uric acid in blood    GERD (gastroesophageal reflux disease)    GI bleed 12/2015   Hyperlipidemia    Hypertension    Long-term (current) use of anticoagulants    Moderate mitral regurgitation    Personal history of radiation therapy    Pulmonary HTN (HCC)    Echo 1/19: EF 60-65, no RWMA, Ao sclerosis, trivial AI, mild MR, mod BAE, mod TR, PASP 49   Stroke (HCC) 01/25/2016   TIA (transient ischemic attack) 09/2011   Urinary incontinence    Urinary retention     Assessment: Patient Reported Symptoms:  Cognitive Cognitive Status: Able to follow simple commands, Alert and oriented to person, place, and time, Normal speech and language skills Cognitive/Intellectual Conditions Management [RPT]: Brain Injury Brain Injury: History of CVA   Health Maintenance Behaviors: Annual physical exam, Healthy diet Health Facilitated by: Healthy diet  Neurological Neurological Review of Symptoms: Weakness Neurological Management Strategies: Medication therapy, Routine screening  HEENT HEENT Symptoms Reported: No symptoms reported      Cardiovascular Cardiovascular Symptoms Reported: No symptoms reported Does patient have uncontrolled Hypertension?: No Cardiovascular Management Strategies: Medication therapy, Routine screening, Weight management Do You Have a Working Readable Scale?:  Yes Weight: 102 lb 6.4 oz (46.4 kg) (Patient reported)  Respiratory Respiratory Symptoms Reported: No symptoms reported Additional Respiratory Details: Patient reports she is not sure if she has gotten her flu shot yet this year. Advised to discuss with PCP at upcoming appointment. Respiratory Management Strategies: Medication therapy, Oxygen  therapy  Endocrine Endocrine Symptoms Reported: Not assessed Is patient diabetic?: No    Gastrointestinal Gastrointestinal Symptoms Reported: No symptoms reported Additional Gastrointestinal Details: Note per chart review patient was hospitalized 06/15/24-06/20/24 for SBO managed conservatively. Patient reports she has been eating small, frequent meals as advised. Patient reports she is having regular, soft BMs. Last BM today. Patient confirms she is taking Miralax  daily as ordered, but is unsure if ALF is giving her Colace. Advised that Colace is ordered daily as needed, and that she can ask staff if she does start to feel constipated. She reports she remains a little weak from being without food and water during hospitalization, but is listening to her body and resting. Patient reports she has a hospital follow-up with PCP 07/10/24 at 11 AM. Gastrointestinal Management Strategies: Coping strategies, Medication therapy    Genitourinary Genitourinary Symptoms Reported: Other Other Genitourinary Symptoms: Patient reports she was on antibiotics for UTI during hospitalization. She followed up with urology 06/24/24 as advised, and is currently on Keflex . Patient notes her next follow-up is mid November. Genitourinary Management Strategies: Catheter, indwelling Indwelling Catheter Inserted: 06/24/24 (Per patient report)  Integumentary Integumentary Symptoms Reported: Not assessed    Musculoskeletal Musculoskelatal Symptoms Reviewed: Weakness, Unsteady gait Musculoskeletal Management Strategies: Adequate rest, Coping strategies, Medical device (Rollator outside of  apartment, nothing inside  of apartment) Musculoskeletal Comment: Patient denies falls since previous CMRN visit Falls in the past year?: Yes Number of falls in past year: 1 or less Was there an injury with Fall?: No Fall Risk Category Calculator: 1 Patient Fall Risk Level: Low Fall Risk Patient at Risk for Falls Due to: History of fall(s), Impaired balance/gait Fall risk Follow up: Falls evaluation completed, Education provided, Falls prevention discussed  Psychosocial Psychosocial Symptoms Reported: Not assessed          06/26/2024    PHQ2-9 Depression Screening   Little interest or pleasure in doing things    Feeling down, depressed, or hopeless    PHQ-2 - Total Score    Trouble falling or staying asleep, or sleeping too much    Feeling tired or having little energy    Poor appetite or overeating     Feeling bad about yourself - or that you are a failure or have let yourself or your family down    Trouble concentrating on things, such as reading the newspaper or watching television    Moving or speaking so slowly that other people could have noticed.  Or the opposite - being so fidgety or restless that you have been moving around a lot more than usual    Thoughts that you would be better off dead, or hurting yourself in some way    PHQ2-9 Total Score    If you checked off any problems, how difficult have these problems made it for you to do your work, take care of things at home, or get along with other people    Depression Interventions/Treatment      There were no vitals filed for this visit.  Medications Reviewed Today     Reviewed by Arno Rosaline SQUIBB, RN (Registered Nurse) on 06/26/24 at 1451  Med List Status: <None>   Medication Order Taking? Sig Documenting Provider Last Dose Status Informant  acetaminophen  (TYLENOL ) 325 MG tablet [id number]  Take 2 tablets (650 mg total) by mouth every 6 (six) hours as needed for up to 7 days for mild pain (pain score 1-3) (or Fever  >/= 101). Simaan, Elizabeth S, PA-C  Active   acetaminophen  (TYLENOL ) 500 MG tablet [id number] Yes Take 1,000 mg by mouth every 12 (twelve) hours as needed (pain). [provider]  Active Self, Pharmacy Records           Med Note STEPHEN, [facility]   Fri Apr 21, 2023 10:04 AM)    albuterol  (VENTOLIN  HFA) 108 [phone] Base) MCG/ACT inhaler [id number] Yes Inhale 1 puff into the lungs every 6 (six) hours as needed for wheezing or shortness of breath (Cough). Regalado, Belkys A, MD  Active Self, Pharmacy Records           Med Note STEPHEN, [facility]   Fri Apr 21, 2023 10:04 AM)    allopurinol  (ZYLOPRIM ) 100 MG tablet [id number] Yes Take 1 tablet (100 mg total) by mouth daily. Regalado, Belkys A, MD  Active Self, Pharmacy Records           Med Note STEPHEN, [facility]   Fri Apr 21, 2023 10:04 AM)    apixaban  (ELIQUIS ) 2.5 MG TABS tablet [id number] Yes Take 2.5 mg by mouth 2 (two) times daily. [provider]  Active   ascorbic acid  (VITAMIN C ) 500 MG tablet [id number] Yes Take 500 mg by mouth 2 (two) times daily. [provider]  Active Self, Pharmacy Records  atorvastatin  (LIPITOR) 10 MG tablet [id number] Yes Take 1  tablet (10 mg total) by mouth daily. Jeffrie Oneil BROCKS, MD  Active Self, Pharmacy Records  B Complex Vitamins (VITAMIN B COMPLEX ) CAPS [id number] Yes Take 1 capsule by mouth daily. [provider]  Active Self, Pharmacy Records  calcium  carbonate (OSCAL) 1500 (600 Ca) MG TABS tablet [id number] Yes Take 1 tablet by mouth 2 (two) times daily with a meal. [provider]  Active Self, Pharmacy Records  cephALEXin  (KEFLEX ) 500 MG capsule [id number] Yes Take 1 capsule (500 mg total) by mouth 2 (two) times daily for 7 days. Leotis Bogus, MD  Active   cyanocobalamin  (VITAMIN B12) 1000 MCG tablet [id number] Yes Take 1,000 mcg by mouth daily. [provider]  Active Self, Pharmacy Records  docusate sodium  (COLACE) 100 MG capsule [id number] Yes Take 100 mg by mouth daily  as needed (constipation). [provider]  Active Self, Pharmacy Records  Ferrous Sulfate  (IRON ) 325 (65 Fe) MG TABS [id number] Yes Take 325 mg by mouth daily. [provider]  Active Self, Pharmacy Records  furosemide  (LASIX ) 40 MG tablet [id number] Yes Take 1 tablet (40 mg total) by mouth daily. Jeffrie Oneil BROCKS, MD  Active Self, Pharmacy Records  metoprolol  tartrate (LOPRESSOR ) 100 MG tablet [id number] Yes Take 1 tablet (100 mg total) by mouth 2 (two) times daily. Jeffrie Oneil BROCKS, MD  Active Self, Pharmacy Records  mineral oil-hydrophilic petrolatum (AQUAPHOR) ointment [id number]  Apply topically as needed for dry skin.  Patient taking differently: Apply 1 Application topically See admin instructions. Apply to topically every 24 hours as needed for uterine prolapse.   Cathlyn BROCKS Nikki Bobie FORBES, MD  Active Self, Pharmacy Records  Multiple Vitamins-Minerals (CENTRUM SILVER ADULT 50+) TABS [id number] Yes Take 1 tablet by mouth daily. [provider]  Active   omeprazole (PRILOSEC) 10 MG capsule [id number] Yes Take 10 mg by mouth every other day. [provider]  Active Self, Pharmacy Records           Med Note STEPHEN KLINEFELTER   Wed Aug 25, 2021 11:18 AM)    OXYGEN  [id number] Yes Inhale 2 L/min into the lungs at bedtime. via  [provider]  Active Self, Pharmacy Records  polyethylene glycol (MIRALAX  / GLYCOLAX ) 17 g packet [id number] Yes Take 17 g by mouth at bedtime. [provider]  Active Self, Pharmacy Records  potassium chloride  SA (KLOR-CON  M) 20 MEQ tablet [id number] Yes Take 1 tablet (20 mEq total) by mouth daily. Arrien, Elidia Sieving, MD  Active Self, Pharmacy Records  Probiotic Product (UP4 PROBIOTICS WOMENS PO) [id number] Yes Take 1 capsule by mouth daily with breakfast. [provider]  Active Self, Pharmacy Records  pyridOXINE (B-6) 50 MG tablet [id number] Yes 1 tablet Orally [provider]  Active Self, Pharmacy Records   Med List Note Geri Jon HERO, CPhT 06/16/24 1342): [facility] [phone]            Recommendation:   PCP Follow-up Continue Current Plan of Care  Follow Up Plan:   Telephone follow up appointment date/time:  07/11/24 at 2 PM  Rosaline Finlay, RN MSN [place]  [facility] RN Care Manager Direct Dial: [phone]  Fax: [phone]

## 2024-06-26 NOTE — Patient Instructions (Signed)
 Visit Information  Thank you for taking time to visit with me today. Please don't hesitate to contact me if I can be of assistance to you before our next scheduled appointment.  Your next care management appointment is by telephone on 07/11/24 at 2 PM  Please call the care guide team at [phone] if you need to cancel, schedule, or reschedule an appointment.   Please call the Suicide and Crisis Lifeline: 988 call 1-800-273-TALK (toll free, 24 hour hotline) if you are experiencing a Mental Health or Behavioral Health Crisis or need someone to talk to.  Rosaline Finlay, RN MSN [place]  [facility] RN Care Manager Direct Dial: [phone]  Fax: [phone]

## 2024-06-28 ENCOUNTER — Other Ambulatory Visit: Payer: Self-pay | Admitting: *Deleted

## 2024-06-28 DIAGNOSIS — C50811 Malignant neoplasm of overlapping sites of right female breast: Secondary | ICD-10-CM

## 2024-07-01 ENCOUNTER — Inpatient Hospital Stay: Payer: Medicare Other

## 2024-07-01 ENCOUNTER — Inpatient Hospital Stay: Payer: Medicare Other | Admitting: Hematology and Oncology

## 2024-07-05 ENCOUNTER — Other Ambulatory Visit: Payer: Self-pay | Admitting: *Deleted

## 2024-07-05 DIAGNOSIS — I340 Nonrheumatic mitral (valve) insufficiency: Secondary | ICD-10-CM

## 2024-07-11 ENCOUNTER — Telehealth: Payer: Self-pay

## 2024-07-11 ENCOUNTER — Other Ambulatory Visit: Payer: Self-pay

## 2024-07-11 DIAGNOSIS — R7303 Prediabetes: Secondary | ICD-10-CM | POA: Diagnosis not present

## 2024-07-11 DIAGNOSIS — E7800 Pure hypercholesterolemia, unspecified: Secondary | ICD-10-CM | POA: Diagnosis not present

## 2024-07-11 DIAGNOSIS — R3989 Other symptoms and signs involving the genitourinary system: Secondary | ICD-10-CM | POA: Diagnosis not present

## 2024-07-11 DIAGNOSIS — K219 Gastro-esophageal reflux disease without esophagitis: Secondary | ICD-10-CM | POA: Diagnosis not present

## 2024-07-11 DIAGNOSIS — Z8719 Personal history of other diseases of the digestive system: Secondary | ICD-10-CM | POA: Diagnosis not present

## 2024-07-11 DIAGNOSIS — M109 Gout, unspecified: Secondary | ICD-10-CM | POA: Diagnosis not present

## 2024-07-11 DIAGNOSIS — N1831 Chronic kidney disease, stage 3a: Secondary | ICD-10-CM | POA: Diagnosis not present

## 2024-07-11 DIAGNOSIS — Z978 Presence of other specified devices: Secondary | ICD-10-CM | POA: Diagnosis not present

## 2024-07-11 DIAGNOSIS — Z23 Encounter for immunization: Secondary | ICD-10-CM | POA: Diagnosis not present

## 2024-07-11 NOTE — Patient Outreach (Signed)
 Received call from Griselda, CMA at PCP office. Per Ihor - PCP does not think that patient's symptoms are related to UTI. They are waiting for urine culture to come back, which typically takes a couple of days. They will send results of urine culture to urology when it comes in. Ihor states that PCP indicates symptoms may be related to urethral irritation and she advised she follow-up with urologist.  Call placed to patient to provide update. Reviewed strategies to prevent urethral irritation due to catheterization, including securing the tubing, maintaining proper drainage, wash daily, and drink plenty of fluids. Advised patient that I will follow-up with her after urology appointment on Monday 07/15/24.  Rosaline Finlay, RN MSN [place]  [facility] Population [facility] RN Care Manager Direct Dial: [phone]  Fax: [phone]

## 2024-07-11 NOTE — Patient Instructions (Signed)
 Visit Information  Thank you for taking time to visit with me today. Please don't hesitate to contact me if I can be of assistance to you before our next scheduled appointment.  I will plan to reach out to you tomorrow afternoon 07/12/24 or sooner if I hear back from Dr. Dianne office.  Please call the care guide team at [phone] if you need to cancel, schedule, or reschedule an appointment.   Please call the Suicide and Crisis Lifeline: 988 call 1-800-273-TALK (toll free, 24 hour hotline) if you are experiencing a Mental Health or Behavioral Health Crisis or need someone to talk to.  Rosaline Finlay, RN MSN [place]  [facility] Population [facility] RN Care Manager Direct Dial: [phone]  Fax: [phone]

## 2024-07-11 NOTE — Patient Outreach (Signed)
 Complex Care Management   Visit Note  07/11/2024  Name:  Adrienne [name] MRN: [id number] DOB: [birth year]  Situation: Referral received for Complex Care Management related to Heart Failure and recurrent UTI I obtained verbal consent from Patient.  Visit completed with Patient  on the phone  Background:   Past Medical History:  Diagnosis Date   Afib (HCC)    Arthritis of ankle    CAD (coronary artery disease)    Cancer (HCC) 2012   breast cancer-right   Compression fracture of T12 vertebra (HCC)    Congestive heart disease (HCC)    Diverticulosis    Edema    Elevated uric acid in blood    GERD (gastroesophageal reflux disease)    GI bleed 12/2015   Hyperlipidemia    Hypertension    Long-term (current) use of anticoagulants    Moderate mitral regurgitation    Personal history of radiation therapy    Pulmonary HTN (HCC)    Echo 1/19: EF 60-65, no RWMA, Ao sclerosis, trivial AI, mild MR, mod BAE, mod TR, PASP 49   Stroke (HCC) 01/25/2016   TIA (transient ischemic attack) 09/2011   Urinary incontinence    Urinary retention     Assessment: Patient Reported Symptoms:  Cognitive Cognitive Status: Able to follow simple commands, Alert and oriented to person, place, and time, Normal speech and language skills Cognitive/Intellectual Conditions Management [RPT]: Brain Injury Brain Injury: History of CVA   Health Maintenance Behaviors: Annual physical exam, Healthy diet Health Facilitated by: Healthy diet  Neurological Neurological Review of Symptoms: Not assessed    HEENT HEENT Symptoms Reported: Not assessed      Cardiovascular Cardiovascular Symptoms Reported: No symptoms reported Does patient have uncontrolled Hypertension?: No Cardiovascular Management Strategies: Medication therapy, Routine screening, Weight management Do You Have a Working Readable Scale?: Yes Weight: 106 lb (48.1 kg) (Patient reported) Cardiovascular Comment: Patient reprots her weight is  going up, which she attributes to increasing to 3 meals a day instead of 2 meals. Patient denies swelling in her legs or feet. She reports compliance with furosemide . Patient denies an increase of 2 lbs overnight or 5 lbs in a week. She reports she will review her weight log to be sure and will contact cardiology.  Respiratory Respiratory Symptoms Reported: No symptoms reported Respiratory Management Strategies: Medication therapy, Oxygen  therapy  Endocrine Endocrine Symptoms Reported: Not assessed Is patient diabetic?: No    Gastrointestinal Gastrointestinal Symptoms Reported: No symptoms reported Additional Gastrointestinal Details: Patient reports she continues to have regular bowel movements, I'm doing fine on that score. She continues to take Miralax  and is having daily bowel movements. Gastrointestinal Management Strategies: Coping strategies, Medication therapy    Genitourinary Genitourinary Symptoms Reported: Other Other Genitourinary Symptoms: Patient reports she feels that she has another UTI. She reports she had follow-up with PCP this morning and a urine sample was taken and report will be sent to urology. Patient reports symptoms include suprapubic discomfort. Genitourinary Management Strategies: Catheter, indwelling Genitourinary Comment: Patietn reports her next scheduled appointment with urologist is 07/15/24.  Integumentary Integumentary Symptoms Reported: Not assessed    Musculoskeletal Musculoskelatal Symptoms Reviewed: Not assessed        Psychosocial Psychosocial Symptoms Reported: Not assessed          07/11/2024    PHQ2-9 Depression Screening   Little interest or pleasure in doing things    Feeling down, depressed, or hopeless    PHQ-2 - Total Score    Trouble falling or  staying asleep, or sleeping too much    Feeling tired or having little energy    Poor appetite or overeating     Feeling bad about yourself - or that you are a failure or have let yourself or  your family down    Trouble concentrating on things, such as reading the newspaper or watching television    Moving or speaking so slowly that other people could have noticed.  Or the opposite - being so fidgety or restless that you have been moving around a lot more than usual    Thoughts that you would be better off dead, or hurting yourself in some way    PHQ2-9 Total Score    If you checked off any problems, how difficult have these problems made it for you to do your work, take care of things at home, or get along with other people    Depression Interventions/Treatment      There were no vitals filed for this visit.  Medications Reviewed Today   Medications were not reviewed in this encounter     Recommendation:   Specialty provider follow-up urology 07/15/24 per patient Continue Current Plan of Care  Follow Up Plan:   Telephone follow-up in 1 day  Rosaline Finlay, RN MSN [place]  [facility] RN Care Manager Direct Dial: [phone]  Fax: [phone]

## 2024-07-15 ENCOUNTER — Telehealth: Payer: Self-pay

## 2024-07-15 DIAGNOSIS — R338 Other retention of urine: Secondary | ICD-10-CM | POA: Diagnosis not present

## 2024-07-15 NOTE — Patient Outreach (Signed)
 Call paced to patient for quick update regarding urology symptoms. Patient reports they took another urine sample at urology visit today so she will hopefully hear the results within the next couple of days. She reports they have advised her to take Tylenol  for suprapubic discomfort. Patient reports catheter was exchanged today. CMRN will follow-up in about a week.  Rosaline Finlay, RN MSN [place]  [facility] RN Care Manager Direct Dial: [phone]  Fax: [phone]

## 2024-07-17 ENCOUNTER — Telehealth: Payer: Self-pay

## 2024-07-17 ENCOUNTER — Other Ambulatory Visit: Payer: Self-pay | Admitting: *Deleted

## 2024-07-17 DIAGNOSIS — Z170 Estrogen receptor positive status [ER+]: Secondary | ICD-10-CM

## 2024-07-17 NOTE — Telephone Encounter (Signed)
 Left message on voicemail about upcoming appointment on 11/6 with lab.

## 2024-07-18 ENCOUNTER — Inpatient Hospital Stay: Attending: Hematology and Oncology

## 2024-07-18 ENCOUNTER — Inpatient Hospital Stay (HOSPITAL_BASED_OUTPATIENT_CLINIC_OR_DEPARTMENT_OTHER): Admitting: Hematology and Oncology

## 2024-07-18 VITALS — BP 133/70 | HR 81 | Temp 97.4°F | Resp 18 | Wt 109.8 lb

## 2024-07-18 DIAGNOSIS — Z171 Estrogen receptor negative status [ER-]: Secondary | ICD-10-CM | POA: Insufficient documentation

## 2024-07-18 DIAGNOSIS — Z87891 Personal history of nicotine dependence: Secondary | ICD-10-CM | POA: Diagnosis not present

## 2024-07-18 DIAGNOSIS — Z1231 Encounter for screening mammogram for malignant neoplasm of breast: Secondary | ICD-10-CM | POA: Diagnosis not present

## 2024-07-18 DIAGNOSIS — Z1732 Human epidermal growth factor receptor 2 negative status: Secondary | ICD-10-CM | POA: Diagnosis not present

## 2024-07-18 DIAGNOSIS — Z923 Personal history of irradiation: Secondary | ICD-10-CM | POA: Insufficient documentation

## 2024-07-18 DIAGNOSIS — Z1722 Progesterone receptor negative status: Secondary | ICD-10-CM | POA: Diagnosis not present

## 2024-07-18 DIAGNOSIS — C50811 Malignant neoplasm of overlapping sites of right female breast: Secondary | ICD-10-CM

## 2024-07-18 DIAGNOSIS — Z79899 Other long term (current) drug therapy: Secondary | ICD-10-CM | POA: Diagnosis not present

## 2024-07-18 DIAGNOSIS — C50212 Malignant neoplasm of upper-inner quadrant of left female breast: Secondary | ICD-10-CM | POA: Diagnosis not present

## 2024-07-18 DIAGNOSIS — Z170 Estrogen receptor positive status [ER+]: Secondary | ICD-10-CM | POA: Diagnosis not present

## 2024-07-18 DIAGNOSIS — Z9011 Acquired absence of right breast and nipple: Secondary | ICD-10-CM | POA: Insufficient documentation

## 2024-07-18 DIAGNOSIS — D649 Anemia, unspecified: Secondary | ICD-10-CM | POA: Insufficient documentation

## 2024-07-18 DIAGNOSIS — Z853 Personal history of malignant neoplasm of breast: Secondary | ICD-10-CM | POA: Diagnosis not present

## 2024-07-18 DIAGNOSIS — Z800 Family history of malignant neoplasm of digestive organs: Secondary | ICD-10-CM | POA: Insufficient documentation

## 2024-07-18 DIAGNOSIS — Z803 Family history of malignant neoplasm of breast: Secondary | ICD-10-CM | POA: Insufficient documentation

## 2024-07-18 DIAGNOSIS — N390 Urinary tract infection, site not specified: Secondary | ICD-10-CM | POA: Insufficient documentation

## 2024-07-18 LAB — CMP (CANCER CENTER ONLY)
ALT: 13 U/L (ref 0–44)
AST: 16 U/L (ref 15–41)
Albumin: 2.8 g/dL — ABNORMAL LOW (ref 3.5–5.0)
Alkaline Phosphatase: 96 U/L (ref 38–126)
Anion gap: 3 — ABNORMAL LOW (ref 5–15)
BUN: 20 mg/dL (ref 8–23)
CO2: 33 mmol/L — ABNORMAL HIGH (ref 22–32)
Calcium: 8.5 mg/dL — ABNORMAL LOW (ref 8.9–10.3)
Chloride: 100 mmol/L (ref 98–111)
Creatinine: 1.29 mg/dL — ABNORMAL HIGH (ref 0.44–1.00)
GFR, Estimated: 38 mL/min — ABNORMAL LOW (ref >60)
Glucose, Bld: 110 mg/dL — ABNORMAL HIGH (ref 70–99)
Potassium: 3.8 mmol/L (ref 3.5–5.1)
Sodium: 136 mmol/L (ref 135–145)
Total Bilirubin: 0.6 mg/dL (ref 0.0–1.2)
Total Protein: 6 g/dL — ABNORMAL LOW (ref 6.5–8.1)

## 2024-07-18 LAB — CBC WITH DIFFERENTIAL (CANCER CENTER ONLY)
Abs Immature Granulocytes: 0.03 K/uL (ref 0.00–0.07)
Basophils Absolute: 0 K/uL (ref 0.0–0.1)
Basophils Relative: 0 %
Eosinophils Absolute: 0 K/uL (ref 0.0–0.5)
Eosinophils Relative: 0 %
HCT: 31.3 % — ABNORMAL LOW (ref 36.0–46.0)
Hemoglobin: 10.6 g/dL — ABNORMAL LOW (ref 12.0–15.0)
Immature Granulocytes: 0 %
Lymphocytes Relative: 10 %
Lymphs Abs: 0.9 K/uL (ref 0.7–4.0)
MCH: 32.3 pg (ref 26.0–34.0)
MCHC: 33.9 g/dL (ref 30.0–36.0)
MCV: 95.4 fL (ref 80.0–100.0)
Monocytes Absolute: 0.5 K/uL (ref 0.1–1.0)
Monocytes Relative: 6 %
Neutro Abs: 7.4 K/uL (ref 1.7–7.7)
Neutrophils Relative %: 84 %
Platelet Count: 239 K/uL (ref 150–400)
RBC: 3.28 MIL/uL — ABNORMAL LOW (ref 3.87–5.11)
RDW: 15.5 % (ref 11.5–15.5)
WBC Count: 8.9 K/uL (ref 4.0–10.5)
nRBC: 0 % (ref 0.0–0.2)

## 2024-07-18 NOTE — Progress Notes (Signed)
 [facility]  [phone] [phone]     ID: Codie Hainer DOB: December 11, 1927  MR#: [id number]  RDW#:[id number]  Patient Care Team: Cleotilde Planas, MD as PCP - General (Family Medicine) Jeffrie Oneil BROCKS, MD as PCP - Cardiology (Cardiology) Cathlyn BROCKS Cary, Bobie BRAVO, MD as Consulting Physician (Obstetrics and Gynecology) Jeffrie Oneil BROCKS, MD as Consulting Physician (Cardiology) Court Pulling, MD as Referring Physician (Dermatology) Brenna Adine CROME, DO as Consulting Physician (Pulmonary Disease) Ebbie Cough, MD as Consulting Physician (General Surgery) Shannon Agent, MD as Consulting Physician (Radiation Oncology) Arno Rosaline SQUIBB, RN as Registered Nurse Amber Stalls, MD   CHIEF COMPLAINT: Bilateral breast cancer,  estrogen receptor positive (s/p right mastectomy)  CURRENT TREATMENT: Observation  INTERVAL HISTORY:  Treyana returns today for follow up of her bilateral breast cancers.  She continues under observation.  She is here by herself. Since her last visit here, she has been having UTI's and SBO managed conservatively. She has an indwelling urinary catheter and complains of aching pain in the area. No flank pain, fevers or chills Mammogram in March 2025, neg for malignancy. Rest of the pertinent 10 point ROS reviewed and negative.  COVID 19 VACCINATION STATUS: Status post Pfizer x2+ booster October 2021  RIGHT BREAST CANCER HISTORY: From the original intake note:  Warren Kugelman has a right breast mass noted by her primary care doctor in [place]  in 2012.  She was referred to surgery and underwent right mastectomy and axillary lymph node dissection or sampling (we do not have the pathology report) 06/04/2011 for what appears to have been a multicentric invasive ductal carcinoma, grade 2.  There were three separate tumor foci, spanning a total of 3.8 cm. Prognostic indicators were: ER positive, PR positive, and  Her2 negative. One out of 3 biopsied lymph nodes was positive for micrometastasis.   LEFT BREAST CANCER HISTORY: Tavia had routine left screening mammography on 11/19/2019 showing a possible abnormality. She underwent left diagnostic mammography with tomography and left breast ultrasonography at [facility] on 12/05/2019 showing: breast density category C; 0.7 cm left breast mass at 11 o'clock; development of three left breast cysts compared to prior imaging from [place]  in 11/2017; no left axillary adenopathy.  Accordingly on 12/10/2019 she proceeded to biopsy of the left breast area in question. The pathology from this procedure ([id number]) showed: invasive ductal carcinoma, grade 3; ductal carcinoma in situ. Prognostic indicators significant for: estrogen receptor, 0% negative and progesterone receptor, 0% negative. Proliferation marker Ki67 at 90%. HER2 negative by immunohistochemistry (0).  The patient's subsequent history is as detailed below.   PAST MEDICAL HISTORY: Past Medical History:  Diagnosis Date   Afib (HCC)    Arthritis of ankle    CAD (coronary artery disease)    Cancer (HCC) 2012   breast cancer-right   Compression fracture of T12 vertebra (HCC)    Congestive heart disease (HCC)    Diverticulosis    Edema    Elevated uric acid in blood    GERD (gastroesophageal reflux disease)    GI bleed 12/2015   Hyperlipidemia    Hypertension    Long-term (current) use of anticoagulants    Moderate mitral regurgitation    Personal history of radiation therapy    Pulmonary HTN (HCC)    Echo 1/19: EF 60-65, no RWMA, Ao sclerosis, trivial AI, mild MR, mod BAE, mod TR, PASP 49   Stroke (HCC) 01/25/2016   TIA (transient ischemic attack) 09/2011   Urinary incontinence  Urinary retention     PAST SURGICAL HISTORY: Past Surgical History:  Procedure Laterality Date   APPENDECTOMY     AXILLARY SENTINEL NODE BIOPSY Left 01/22/2020   Procedure: Left Axillary Sentinel Node  Biopsy;  Surgeon: Ebbie Cough, MD;  Location: [facility] OR;  Service: General;  Laterality: Left;   BREAST LUMPECTOMY WITH RADIOACTIVE SEED AND SENTINEL LYMPH NODE BIOPSY Left 01/22/2020   Procedure: LEFT BREAST LUMPECTOMY WITH RADIOACTIVE SEED;  Surgeon: Ebbie Cough, MD;  Location: [facility] OR;  Service: General;  Laterality: Left;   BREAST SURGERY  2012   Rt.mastectomy--[place], [place]   CARDIAC CATHETERIZATION  2013   CATARACT EXTRACTION, BILATERAL     CERVICAL CONIZATION W/BX N/A 08/08/2020   Procedure: VAGINAL SIDEWALL BIOPSY;  Surgeon: Cleotilde Ronal RAMAN, MD;  Location: [facility] OR;  Service: Gynecology;  Laterality: N/A;   DILATION AND CURETTAGE OF UTERUS     in her early 58's for DUB   ESOPHAGOGASTRODUODENOSCOPY Left 01/08/2016   Procedure: ESOPHAGOGASTRODUODENOSCOPY (EGD);  Surgeon: Elsie Cree, MD;  Location: [facility];  Service: Endoscopy;  Laterality: Left;   GALLBLADDER SURGERY     MASTECTOMY Right 2012   RADIOLOGY WITH ANESTHESIA N/A 01/25/2016   Procedure: RADIOLOGY WITH ANESTHESIA;  Surgeon: Thyra Nash, MD;  Location: [facility] OR;  Service: Radiology;  Laterality: N/A;   REPLACEMENT TOTAL KNEE BILATERAL     TONSILLECTOMY AND ADENOIDECTOMY     as a child    FAMILY HISTORY: Family History  Problem Relation Age of Onset   Asthma Mother    CVA Father    Atrial fibrillation Sister        HAS PACER   Pneumonia Brother    Cancer Daughter        COLON CANCER   Cancer Maternal Grandmother        breast cancer  Patient's father was 57 years old when he died from stroke. Patient's mother died from coronary artery disease at age 67. The patient denies a family hx of ovarian cancer. She reports breast cancer in her maternal grandmother. She has 2 siblings, 1 brother and 1 sister. Her brother died from stroke/AIDS. She also reports pancreatic cancer in her paternal grandmother and colon cancer in her daughter.   GYNECOLOGIC HISTORY:  Patient's last menstrual period was 09/12/1973  (approximate). Menarche: 89 years old Age at first live birth: 89 years old GX P 3 LMP late 51s Contraceptive never HRT yes, remote , less than 6 months Hysterectomy?  No BSO?  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  Her husband Elsie is a public affairs consultant, (PhD, worked at [employer]).  Their children are Katheryn Babe who lives in [place] and whose husband is a charity fundraiser; Elsie Raddle [facility]) who lives in [place]  and works in finances; and Ritchey, who lives in [place]  and works for [employer].  The patient has 11 grandchildren and 8 great-grandchildren.  She attends [employer] locally    ADVANCED DIRECTIVES: In the absence of documents to the contrary the patient's husband is her healthcare power of attorney   HEALTH MAINTENANCE: Social History   Tobacco Use   Smoking status: Former    Current packs/day: 0.00    Average packs/day: 0.3 packs/day for 2.0 years (0.5 ttl pk-yrs)    Types: Cigarettes    Start date: 75    Quit date: 1950    Years since quitting: 34.8  Smokeless tobacco: Never  Vaping Use   Vaping status: Never Used  Substance Use Topics   Alcohol use: No    Alcohol/week: 0.0 standard drinks of alcohol   Drug use: No     Colonoscopy: Remote  PAP: 02/2019, negative  Bone density: Osteopenia   Allergies  Allergen Reactions   Valium [Diazepam] Other (See Comments)    HYPERACTIVITY    Amiodarone Hcl [Amiodarone] Other (See Comments)    A-Fib   Compazine [Prochlorperazine] Other (See Comments)    HYPERACTIVITY    Other Other (See Comments)    Kidney dye--patient states this gave her severe shakes/chills   Thorazine [Chlorpromazine] Other (See Comments)    HYPERACTIVITY    Zometa [Zoledronic Acid] Other (See Comments)    PROBLEMS WITH EYES  TIA   Ciprofloxacin  Swelling and Other (See Comments)    swelling in Rt.elbow   Doxycycline   Nausea Only   Zoster Vac Recomb Adjuvanted Other (See Comments)    Welt under injection site   Zoster Vaccine Recombinant, Adjuvanted Other (See Comments)    Other Reaction(s): wel under injection  varicella zoster virus glycoprotein E, recombinant   Iodinated Contrast Media Anxiety and Other (See Comments)    Chills and anxiety  Other Reaction(s): chills/shaking, Kidney dye    Current Outpatient Medications  Medication Sig Dispense Refill   acetaminophen  (TYLENOL ) 500 MG tablet Take 1,000 mg by mouth every 12 (twelve) hours as needed (pain).     albuterol  (VENTOLIN  HFA) 108 (90 Base) MCG/ACT inhaler Inhale 1 puff into the lungs every 6 (six) hours as needed for wheezing or shortness of breath (Cough). 18 g 2   allopurinol  (ZYLOPRIM ) 100 MG tablet Take 1 tablet (100 mg total) by mouth daily. 30 tablet 1   apixaban  (ELIQUIS ) 2.5 MG TABS tablet Take 2.5 mg by mouth 2 (two) times daily.     ascorbic acid  (VITAMIN C ) 500 MG tablet Take 500 mg by mouth 2 (two) times daily.     atorvastatin  (LIPITOR) 10 MG tablet Take 1 tablet (10 mg total) by mouth daily. 90 tablet 3   B Complex Vitamins (VITAMIN B COMPLEX ) CAPS Take 1 capsule by mouth daily.     calcium  carbonate (OSCAL) 1500 (600 Ca) MG TABS tablet Take 1 tablet by mouth 2 (two) times daily with a meal.     cyanocobalamin  (VITAMIN B12) 1000 MCG tablet Take 1,000 mcg by mouth daily.     docusate sodium  (COLACE) 100 MG capsule Take 100 mg by mouth daily as needed (constipation).     Ferrous Sulfate  (IRON ) 325 (65 Fe) MG TABS Take 325 mg by mouth daily.     furosemide  (LASIX ) 40 MG tablet Take 1 tablet (40 mg total) by mouth daily. 90 tablet 2   metoprolol  tartrate (LOPRESSOR ) 100 MG tablet Take 1 tablet (100 mg total) by mouth 2 (two) times daily. 180 tablet 3   mineral oil-hydrophilic petrolatum (AQUAPHOR) ointment Apply topically as needed for dry skin. (Patient taking differently: Apply 1 Application topically See admin instructions. Apply  to topically every 24 hours as needed for uterine prolapse.) 420 g 0   Multiple Vitamins-Minerals (CENTRUM SILVER ADULT 50+) TABS Take 1 tablet by mouth daily.     omeprazole (PRILOSEC) 10 MG capsule Take 10 mg by mouth every other day.     OXYGEN  Inhale 2 L/min into the lungs at bedtime. via [place]     polyethylene glycol (MIRALAX  / GLYCOLAX ) 17 g packet Take 17 g by mouth at  bedtime.     potassium chloride  SA (KLOR-CON  M) 20 MEQ tablet Take 1 tablet (20 mEq total) by mouth daily. 30 tablet 0   Probiotic Product (UP4 PROBIOTICS WOMENS PO) Take 1 capsule by mouth daily with breakfast.     pyridOXINE (B-6) 50 MG tablet 1 tablet Orally     No current facility-administered medications for this visit.    OBJECTIVE: White woman who appears stated age  Vitals:   07/18/24 1247  BP: 133/70  Pulse: 81  Resp: 18  Temp: (!) 97.4 F (36.3 C)  SpO2: 93%     Body mass index is 21.44 kg/m.   Wt Readings from Last 3 Encounters:  07/18/24 109 lb 12.8 oz (49.8 kg)  07/11/24 106 lb (48.1 kg)  06/26/24 102 lb 6.4 oz (46.4 kg)     ECOG FS: 1 Physical Exam Constitutional:      General: She is not in acute distress.    Appearance: Normal appearance.  Chest:     Comments: Right breast status postmastectomy.  Left breast with post surgical scarring. No palpable masses. No regional adenopathy. Musculoskeletal:        General: Swelling present.     Cervical back: Normal range of motion and neck supple. No rigidity.  Skin:    General: Skin is warm and dry.  Neurological:     General: No focal deficit present.     Mental Status: She is alert.       LAB RESULTS:  CMP     Component Value Date/Time   NA 132 (L) 06/19/2024 0046   NA 138 04/21/2023 1059   K 4.1 06/19/2024 0046   CL 108 06/19/2024 0046   CO2 19 (L) 06/19/2024 0046   GLUCOSE 141 (H) 06/19/2024 0046   BUN 25 (H) 06/19/2024 0046   BUN 18 04/21/2023 1059   CREATININE 1.09 (H) 06/19/2024 0046   CREATININE 1.15 (H) 06/29/2023 1443    CALCIUM  7.8 (L) 06/19/2024 0046   PROT 4.9 (L) 06/19/2024 0046   PROT 6.6 05/06/2019 1608   ALBUMIN 2.0 (L) 06/19/2024 0046   ALBUMIN 3.9 05/06/2019 1608   AST 21 06/19/2024 0046   AST 16 06/29/2023 1443   ALT 15 06/19/2024 0046   ALT 11 06/29/2023 1443   ALKPHOS 76 06/19/2024 0046   BILITOT 0.6 06/19/2024 0046   BILITOT 0.5 06/29/2023 1443   GFRNONAA 47 (L) 06/19/2024 0046   GFRNONAA 44 (L) 06/29/2023 1443   GFRAA 43 (L) 11/04/2020 1647    No results found for: TOTALPROTELP, ALBUMINELP, A1GS, A2GS, BETS, BETA2SER, GAMS, MSPIKE, SPEI  No results found for: KPAFRELGTCHN, LAMBDASER, KAPLAMBRATIO  Lab Results  Component Value Date   WBC 8.9 07/18/2024   NEUTROABS 7.4 07/18/2024   HGB 10.6 (L) 07/18/2024   HCT 31.3 (L) 07/18/2024   MCV 95.4 07/18/2024   PLT 239 07/18/2024    No results found for: LABCA2  No components found for: [id number]  No results for input(s): INR in the last 168 hours.  No results found for: LABCA2  No results found for: RJW800  No results found for: CAN125  No results found for: CAN153  No results found for: CA2729  No components found for: HGQUANT  No results found for: CEA1, CEA / No results found for: CEA1, CEA   No results found for: AFPTUMOR  No results found for: CHROMOGRNA  No results found for: HGBA, HGBA2QUANT, HGBFQUANT, HGBSQUAN (Hemoglobinopathy evaluation)   No results found for: LDH  Lab Results  Component Value Date   IRON  66 08/25/2020   (Iron  and TIBC)  Lab Results  Component Value Date   FERRITIN 101 08/25/2020    Urinalysis    Component Value Date/Time   COLORURINE YELLOW 06/15/2024 1326   APPEARANCEUR CLOUDY (A) 06/15/2024 1326   LABSPEC 1.014 06/15/2024 1326   PHURINE 5.0 06/15/2024 1326   GLUCOSEU NEGATIVE 06/15/2024 1326   HGBUR MODERATE (A) 06/15/2024 1326   BILIRUBINUR NEGATIVE 06/15/2024 1326   BILIRUBINUR n 04/14/2020 1705    KETONESUR NEGATIVE 06/15/2024 1326   PROTEINUR NEGATIVE 06/15/2024 1326   UROBILINOGEN 0.2 04/14/2020 1705   NITRITE NEGATIVE 06/15/2024 1326   LEUKOCYTESUR LARGE (A) 06/15/2024 1326    STUDIES: DG Abd Portable 1V Result Date: 06/19/2024 CLINICAL DATA:  [id number] SBO (small bowel obstruction) (HCC) [id number] EXAM: PORTABLE ABDOMEN - 1 VIEW COMPARISON:  06/17/2024, 06/15/2024 FINDINGS: Gaseous distention of the stomach. Gas-filled segments of bowel in the upper abdomen, likely decompressed transverse colon.The remainder of the small bowel is gas-filled and decompressed.No pneumoperitoneum. No organomegaly or radiopaque calculi. No acute fracture or destructive lesion. The lung bases are clear. IMPRESSION: Gaseous distension of the stomach and transverse colon. Otherwise, the remainder of the small bowel is gas-filled, but nondistended. Electronically Signed   By: Rogelia Myers M.D.   On: 06/19/2024 18:13      ELIGIBLE FOR AVAILABLE RESEARCH PROTOCOL: No  ASSESSMENT: 89 y.o. [place] woman   RIGHT BREAST CANCER (1) status post right mastectomy and sentinel lymph node sampling September 2012 for what appears to have been a multifocal T1-T2 N1 (mic) invasive ductal carcinoma, estrogen and progesterone receptor positive, HER-2 not amplified  (2) completed 5 years of anastrozole  2017  LEFT BREAST CANCER (3) status post left breast upper inner quadrant biopsy 12/10/2019 for a clinical T1b N0, stage IB invasive ductal carcinoma grade 3, triple negative, with an MIB-1 of 90%  (4) status post left lumpectomy and sentinel lymph node sampling 01/22/2020 for a pT1a pN0, stage IA invasive ductal carcinoma, with negative margins  (a) ductal carcinoma in situ with necrosis also removed, all margins negative  (b) a total of 2 lymph nodes were removed  (5) adjuvant chemotherapy discussed, opted against  (6) meets criteria for genetics testing  (7) adjuvant radiation 03/02/2020 through 03/24/2020 Site  Technique Total Dose (Gy) Dose per Fx (Gy) Completed Fx Beam Energies  Breast, Left: Breast_Lt 3D 42.72/42.72 2.67 16/16 6X, 15X     PLAN:  Assessment and Plan Assessment & Plan History of bilateral breast cancer s/p right mastectomy and left lumpectomy Chronic nipple inversion with significant scar tissue in the left breast limits palpation. Recent mammogram normal. - Ordered mammogram for March. - Continue annual surveillance.  Urinary tract infection with chronic indwelling catheter Chronic catheter due to voiding inability. Reports vaginal pain, possibly catheter-related. No significant flank pain or back cramps.  Anemia, mild and stable Mild anemia likely related to recent hospitalization. Hemoglobin low but stable, no transfusion needed.   Total time spent: 20 min including history, physical exam, review of records, counseling and coordination of care  *Total Encounter Time as defined by the Centers for Medicare and Medicaid Services includes, in addition to the face-to-face time of a patient visit (documented in the note above) non-face-to-face time: obtaining and reviewing outside history, ordering and reviewing medications, tests or procedures, care coordination (communications with other health care professionals or caregivers) and documentation in the medical record.

## 2024-07-19 DIAGNOSIS — N390 Urinary tract infection, site not specified: Secondary | ICD-10-CM | POA: Diagnosis not present

## 2024-07-23 NOTE — Patient Instructions (Signed)
 Graylin Rollene Cure - I am sorry I was unable to reach you today. I work with Cleotilde Planas, MD and am calling to support your healthcare needs. Please contact me at [phone] at your earliest convenience. I look forward to speaking with you soon.   Thank you,  Rosaline Finlay, RN MSN [place]  [facility] RN Care Manager Direct Dial: [phone]  Fax: [phone]

## 2024-07-23 NOTE — Patient Outreach (Signed)
 Care Coordination   07/23/2024 Name: Adrienne [name] MRN: [id number] DOB: 1927-12-05   Care Coordination Outreach Attempts:  An unsuccessful outreach was attempted today to complete quick check-in regarding patient reported urinary symptoms.  Follow Up Plan:  Additional outreach attempts will be made to complete follow-up.   Encounter Outcome:  No Answer. HIPAA compliant voicemail left requesting return call.   Rosaline Finlay, RN MSN [place]  [facility] RN Care Manager Direct Dial: [phone]  Fax: [phone]

## 2024-07-25 ENCOUNTER — Telehealth: Payer: Self-pay

## 2024-07-25 NOTE — Patient Outreach (Signed)
 Call placed to patient for quick follow-up. Patient reports she was started on Macrobid  for 7 days and Phenazopyridine  for 3 days. She started medications Tuesday 07/23/24. Patient reports she is still having some urinary burning and discomfort, but it is improving. Patient reports overall just not feeling great in the vaginal area. Patient denies fevers or chills. Patient reports she does not have follow-up with urology scheduled, but will be going early December for catheter exchange.  Patient reports her weight this morning was 109.4 lb. Patient denies swelling or shortness of breath. She denies weight gain of 2 lbs overnight or 5 lbs in a week. Reminded to monitor for weight gain or symptoms of fluid retention and to call cardiology if needed.  CMRN follow-up scheduled 08/07/24 at 2 PM.  Rosaline Finlay, RN MSN [place]  [facility] RN Care Manager Direct Dial: [phone]  Fax: [phone]

## 2024-07-26 ENCOUNTER — Emergency Department (HOSPITAL_BASED_OUTPATIENT_CLINIC_OR_DEPARTMENT_OTHER)
Admission: EM | Admit: 2024-07-26 | Discharge: 2024-07-26 | Disposition: A | Discharge status: Home / Self Care | Attending: Emergency Medicine | Admitting: Emergency Medicine

## 2024-07-26 ENCOUNTER — Other Ambulatory Visit: Payer: Self-pay

## 2024-07-26 ENCOUNTER — Encounter (HOSPITAL_BASED_OUTPATIENT_CLINIC_OR_DEPARTMENT_OTHER): Payer: Self-pay

## 2024-07-26 DIAGNOSIS — Z7901 Long term (current) use of anticoagulants: Secondary | ICD-10-CM | POA: Diagnosis not present

## 2024-07-26 DIAGNOSIS — I10 Essential (primary) hypertension: Secondary | ICD-10-CM | POA: Insufficient documentation

## 2024-07-26 DIAGNOSIS — Y732 Prosthetic and other implants, materials and accessory gastroenterology and urology devices associated with adverse incidents: Secondary | ICD-10-CM | POA: Diagnosis not present

## 2024-07-26 DIAGNOSIS — Z853 Personal history of malignant neoplasm of breast: Secondary | ICD-10-CM | POA: Diagnosis not present

## 2024-07-26 DIAGNOSIS — R7989 Other specified abnormal findings of blood chemistry: Secondary | ICD-10-CM | POA: Diagnosis not present

## 2024-07-26 DIAGNOSIS — I4891 Unspecified atrial fibrillation: Secondary | ICD-10-CM | POA: Insufficient documentation

## 2024-07-26 DIAGNOSIS — N814 Uterovaginal prolapse, unspecified: Secondary | ICD-10-CM | POA: Diagnosis not present

## 2024-07-26 DIAGNOSIS — D649 Anemia, unspecified: Secondary | ICD-10-CM | POA: Diagnosis not present

## 2024-07-26 DIAGNOSIS — T839XXA Unspecified complication of genitourinary prosthetic device, implant and graft, initial encounter: Secondary | ICD-10-CM

## 2024-07-26 DIAGNOSIS — R339 Retention of urine, unspecified: Secondary | ICD-10-CM | POA: Diagnosis present

## 2024-07-26 DIAGNOSIS — T83091A Other mechanical complication of indwelling urethral catheter, initial encounter: Secondary | ICD-10-CM | POA: Diagnosis not present

## 2024-07-26 DIAGNOSIS — Z79899 Other long term (current) drug therapy: Secondary | ICD-10-CM | POA: Diagnosis not present

## 2024-07-26 DIAGNOSIS — T83031A Leakage of indwelling urethral catheter, initial encounter: Secondary | ICD-10-CM | POA: Insufficient documentation

## 2024-07-26 LAB — CBC WITH DIFFERENTIAL/PLATELET
Abs Immature Granulocytes: 0.03 K/uL (ref 0.00–0.07)
Basophils Absolute: 0 K/uL (ref 0.0–0.1)
Basophils Relative: 0 %
Eosinophils Absolute: 0.1 K/uL (ref 0.0–0.5)
Eosinophils Relative: 1 %
HCT: 32.8 % — ABNORMAL LOW (ref 36.0–46.0)
Hemoglobin: 10.5 g/dL — ABNORMAL LOW (ref 12.0–15.0)
Immature Granulocytes: 0 %
Lymphocytes Relative: 15 %
Lymphs Abs: 1.6 K/uL (ref 0.7–4.0)
MCH: 31.8 pg (ref 26.0–34.0)
MCHC: 32 g/dL (ref 30.0–36.0)
MCV: 99.4 fL (ref 80.0–100.0)
Monocytes Absolute: 0.7 K/uL (ref 0.1–1.0)
Monocytes Relative: 6 %
Neutro Abs: 8.1 K/uL — ABNORMAL HIGH (ref 1.7–7.7)
Neutrophils Relative %: 78 %
Platelets: 315 K/uL (ref 150–400)
RBC: 3.3 MIL/uL — ABNORMAL LOW (ref 3.87–5.11)
RDW: 15.6 % — ABNORMAL HIGH (ref 11.5–15.5)
WBC: 10.5 K/uL (ref 4.0–10.5)
nRBC: 0 % (ref 0.0–0.2)

## 2024-07-26 LAB — URINALYSIS, ROUTINE W REFLEX MICROSCOPIC
Bacteria, UA: NONE SEEN
Bilirubin Urine: NEGATIVE
Glucose, UA: NEGATIVE mg/dL
Ketones, ur: NEGATIVE mg/dL
Nitrite: NEGATIVE
Protein, ur: 100 mg/dL — AB
RBC / HPF: >50 RBC/hpf (ref 0–5)
Specific Gravity, Urine: 1.01 (ref 1.005–1.030)
WBC, UA: >50 WBC/hpf (ref 0–5)
pH: 8 (ref 5.0–8.0)

## 2024-07-26 LAB — COMPREHENSIVE METABOLIC PANEL WITH GFR
ALT: 15 U/L (ref 0–44)
AST: 22 U/L (ref 15–41)
Albumin: 3 g/dL — ABNORMAL LOW (ref 3.5–5.0)
Alkaline Phosphatase: 98 U/L (ref 38–126)
Anion gap: 8 (ref 5–15)
BUN: 13 mg/dL (ref 8–23)
CO2: 29 mmol/L (ref 22–32)
Calcium: 9.2 mg/dL (ref 8.9–10.3)
Chloride: 101 mmol/L (ref 98–111)
Creatinine, Ser: 1.11 mg/dL — ABNORMAL HIGH (ref 0.44–1.00)
GFR, Estimated: 46 mL/min — ABNORMAL LOW (ref >60)
Glucose, Bld: 91 mg/dL (ref 70–99)
Potassium: 4.8 mmol/L (ref 3.5–5.1)
Sodium: 137 mmol/L (ref 135–145)
Total Bilirubin: 0.5 mg/dL (ref 0.0–1.2)
Total Protein: 6.5 g/dL (ref 6.5–8.1)

## 2024-07-26 MED ORDER — SODIUM CHLORIDE 0.9 % IV BOLUS
500.0000 mL | Freq: Once | INTRAVENOUS | Status: AC
  Administered 2024-07-26: 500 mL via INTRAVENOUS

## 2024-07-26 MED ORDER — SODIUM CHLORIDE 0.9 % IV BOLUS: 500.0000 mL | Freq: Once | INTRAVENOUS | Status: DC

## 2024-07-26 MED ORDER — SODIUM CHLORIDE 0.9 % IV SOLN
1.0000 g | Freq: Once | INTRAVENOUS | Status: AC
  Administered 2024-07-26: 1 g via INTRAVENOUS
  Filled 2024-07-26: qty 10

## 2024-07-26 MED ORDER — CEPHALEXIN 500 MG PO CAPS: 500.0000 mg | ORAL_CAPSULE | Freq: Two times a day (BID) | ORAL | 0 refills | Status: AC

## 2024-07-26 NOTE — ED Triage Notes (Signed)
 Bladder scan is 0 but patient reports lots of pain in her bladder and vaginal area.

## 2024-07-26 NOTE — ED Notes (Signed)
 Called lab to add urine culture, spoke with Jasmine.

## 2024-07-26 NOTE — Discharge Instructions (Addendum)
 You may have a urinary tract infection (UTI) today, although your urine does not look grossly infected based on your urinalysis today. Your urine has been sent off for culture to see what bacteria is in your urine. If your antibiotic needs to be changed, you will be contacted.  Your Foley has been exchanged today.  It appears that you had a uterine prolapse today on exam.  This has been reduced back into place.  However, please discuss this with your urologist or gynecologist for further management.  This could have been causing your vaginal discomfort.  Your blood counts are your baseline.  Electrolytes are at baseline.  Your creatinine which is a measure of your kidney function is abnormal, but is still at baseline.  This means that your kidneys are not filtering as well as they should be.  Please continue to have your PCP monitor this value.  Medications: You have been prescribed an antibiotic called Cephalexin  (Keflex ). Take this antibiotic 2 times a day for the next 5 days starting tomorrow morning.  Take the full course of your antibiotic even if you start feeling better. Antibiotics may cause you to have diarrhea.   Follow-up instructions: Please follow-up with your urologist within the next 5 days for follow-up  Return instructions:  Please return to the Emergency Department if you: Develop confusion or become poorly responsive or faint Develop a fever above 100.80F Worsening pain You have persistent vomiting and/or are unable to keep medications down Please return if you have any other emergent concerns.

## 2024-07-26 NOTE — ED Provider Notes (Signed)
 [place] EMERGENCY DEPARTMENT AT [facility] Provider Note   CSN: [id number] Arrival date & time: 07/26/24  1305     Patient presents with: Urinary Retention   Adrienne [name] is a 89 y.o. female with history of breast cancer, hypertension, hyperlipidemia, chronic catheter use, uterovaginal prolapse, A-fib on Eliquis , presents with concern for her catheter not draining since yesterday.  She has a chronic indwelling urinary catheter.  She states that she is leaking urine around her catheter.  She also reports some discomfort in the vaginal region.  She notes she was started on nitrofurantoin  this week for potential UTI by her urologist, and is taken about 3 days of this medication without improvement in her symptoms.   HPI     Prior to Admission medications   Medication Sig Start Date End Date Taking? Authorizing Provider  cephALEXin  (KEFLEX ) 500 MG capsule Take 1 capsule (500 mg total) by mouth in the morning and at bedtime for 5 days. 07/26/24 07/31/24 Yes Veta Palma, PA-C  nitrofurantoin , macrocrystal-monohydrate, (MACROBID ) 100 MG capsule Take by mouth. 07/22/24  Yes [provider]  phenazopyridine  (PYRIDIUM ) 100 MG tablet Take 100 mg by mouth 3 (three) times daily. 07/19/24  Yes [provider]  acetaminophen  (TYLENOL ) 500 MG tablet Take 1,000 mg by mouth every 12 (twelve) hours as needed (pain).    [provider]  albuterol  (VENTOLIN  HFA) 108 (90 Base) MCG/ACT inhaler Inhale 1 puff into the lungs every 6 (six) hours as needed for wheezing or shortness of breath (Cough). 09/08/22   Regalado, Belkys A, MD  allopurinol  (ZYLOPRIM ) 100 MG tablet Take 1 tablet (100 mg total) by mouth daily. 09/08/22   Regalado, Belkys A, MD  apixaban  (ELIQUIS ) 2.5 MG TABS tablet Take 2.5 mg by mouth 2 (two) times daily.    [provider]  ascorbic acid  (VITAMIN C ) 500 MG tablet Take 500 mg by mouth 2 (two) times daily.    [provider]  atorvastatin  (LIPITOR) 10 MG tablet Take 1 tablet (10 mg total) by mouth daily. 08/14/23   Jeffrie Oneil BROCKS, MD  B Complex Vitamins (VITAMIN B COMPLEX ) CAPS Take 1 capsule by mouth daily.    [provider]  calcium  carbonate (OSCAL) 1500 (600 Ca) MG TABS tablet Take 1 tablet by mouth 2 (two) times daily with a meal.    [provider]  cyanocobalamin  (VITAMIN B12) 1000 MCG tablet Take 1,000 mcg by mouth daily.    [provider]  docusate sodium  (COLACE) 100 MG capsule Take 100 mg by mouth daily as needed (constipation). 06/10/23   [provider]  Ferrous Sulfate  (IRON ) 325 (65 Fe) MG TABS Take 325 mg by mouth daily.    [provider]  furosemide  (LASIX ) 40 MG tablet Take 1 tablet (40 mg total) by mouth daily. 11/16/23 08/17/24  Jeffrie Oneil BROCKS, MD  metoprolol  tartrate (LOPRESSOR ) 100 MG tablet Take 1 tablet (100 mg total) by mouth 2 (two) times daily. 08/14/23   Jeffrie Oneil BROCKS, MD  mineral oil-hydrophilic petrolatum (AQUAPHOR) ointment Apply topically as needed for dry skin. Patient taking differently: Apply 1 Application topically See admin instructions. Apply to topically every 24 hours as needed for uterine prolapse. 05/30/24   Cathlyn BROCKS Nikki Bobie FORBES, MD  Multiple Vitamins-Minerals (CENTRUM SILVER ADULT 50+) TABS Take 1 tablet by mouth daily.    [provider]  omeprazole (PRILOSEC) 10 MG capsule Take 10 mg by mouth every other day. 03/21/19   [provider]  OXYGEN  Inhale 2 L/min into the lungs at bedtime. via [place]    [provider]  polyethylene glycol (MIRALAX  / GLYCOLAX ) 17 g packet Take 17 g by mouth at bedtime.    [provider]  potassium chloride  SA (KLOR-CON  M) 20 MEQ tablet Take 1 tablet (20 mEq total) by mouth daily. 06/17/23   Arrien, Mauricio Daniel, MD  Probiotic Product (UP4 PROBIOTICS WOMENS PO) Take 1 capsule by mouth daily with breakfast.    [provider]  pyridOXINE (B-6) 50  MG tablet 1 tablet Orally    [provider]    Allergies: Valium [diazepam]; Amiodarone hcl [amiodarone]; Compazine [prochlorperazine]; Other; Thorazine [chlorpromazine]; Zometa [zoledronic acid]; Ciprofloxacin ; Doxycycline ; Zoster vac recomb adjuvanted; Zoster vaccine recombinant, adjuvanted; and Iodinated contrast media    Review of Systems  Genitourinary:  Positive for vaginal pain.    Updated Vital Signs BP (!) 152/76   Pulse 95   Temp 97.8 F (36.6 C)   Resp 20   LMP 09/12/1973 (Approximate)   SpO2 94%   Physical Exam Vitals and nursing note reviewed. Exam conducted with a chaperone present.  Constitutional:      Appearance: Normal appearance.  HENT:     Head: Atraumatic.  Cardiovascular:     Rate and Rhythm: Normal rate. Rhythm irregular.     Heart sounds: No murmur heard. Pulmonary:     Effort: Pulmonary effort is normal.  Abdominal:     General: Abdomen is flat. There is no distension.     Palpations: Abdomen is soft.     Tenderness: There is no abdominal tenderness.  Genitourinary:    Comments: RN Nidia Clinch present to chaperone pelvic exam  Patient with Foley catheter in place and urine leaking around the Foley catheter.  No urine noted in the Foley bag.  Patient with large uterine prolapse that comes outside of the vagina.  I was able to successfully reduce this on exam.  No abnormal vaginal discharge. Neurological:     General: No focal deficit present.     Mental Status: She is alert.  Psychiatric:        Mood and Affect: Mood normal.        Behavior: Behavior normal.     (all labs ordered are listed, but only abnormal results are displayed) Labs Reviewed  CBC WITH DIFFERENTIAL/PLATELET - Abnormal; Notable for the following components:      Result Value   RBC 3.30 (*)    Hemoglobin 10.5 (*)    HCT 32.8 (*)    RDW 15.6 (*)    Neutro Abs 8.1 (*)    All other components within normal limits  COMPREHENSIVE METABOLIC PANEL WITH GFR -  Abnormal; Notable for the following components:   Creatinine, Ser 1.11 (*)    Albumin 3.0 (*)    GFR, Estimated 46 (*)    All other components within normal limits  URINALYSIS, ROUTINE W REFLEX MICROSCOPIC - Abnormal; Notable for the following components:   APPearance HAZY (*)    Hgb urine dipstick LARGE (*)    Protein, ur 100 (*)    Leukocytes,Ua SMALL (*)    All other components within normal limits  URINE CULTURE    EKG: None  Radiology: No results found.   Procedures   Medications Ordered in the ED  sodium chloride  0.9 % bolus 500 mL (0 mLs Intravenous Stopped 07/26/24 1717)  cefTRIAXone  (ROCEPHIN ) 1 g in sodium chloride  0.9 % 100 mL IVPB (0 g Intravenous  Stopped 07/26/24 1832)                                    Medical Decision Making Amount and/or Complexity of Data Reviewed Labs: ordered.  Risk Prescription drug management.     Differential diagnosis includes but is not limited to Foley catheter problem, UTI, pyelonephritis, yeast infection, prolapsed uterus, prolapsed bladder  ED Course:  Upon initial evaluation, patient is well-appearing, no acute distress.  Stable vitals.  With RN chaperone present, I performed external GU exam.  Patient with indwelling urinary catheter present with urine leaking around the catheter.  No urine in the Foley bag.  Patient also with large prolapse of her uterus that does extend out of the vagina.  I was able to successfully reduce this prolapse.  Abdomen is soft and nontender.  Labs Ordered: I Ordered, and personally interpreted labs.  The pertinent results include:   CBC without leukocytosis.  Hemoglobin at 10.5 which is at baseline CMP with mildly elevated creatinine 1.11 which does appear to be at baseline.  No other electrolyte abnormalities Urinalysis with hazy appearance, protein present.  Large amount of red blood cells and white blood cells present.  No bacteria noted  Medications Given: Ceftriaxone  500 mL NS  bolus  Upon re-evaluation, patient remains well-appearing with stable vitals.  Foley catheter was replaced by nursing and about 150 mL of urine was emptied.  Urine with gross hematuria present.  Nursing also reports some blood clots that were noticed when Foley was placed.  Her urine does have large amount of red blood cells and white blood cells urinalysis, and given her reported suprapubic discomfort, will treat for potential UTI.  She was given a dose of ceftriaxone  here today given her most recent culture sensitivity from September 2025 showed escherichia coli and morganella morganii sensitive to ceftriaxone .  This was noted to be resistant to nitrofurantoin .  Will discharge home with course of Keflex  and will have patient stop nitrofurantoin .  Patient does not have any flank pain, no leukocytosis, no fevers, no concern for systemic infection or pyelonephritis.   Regarding her uterine prolapse, this was reduced today.  Suspect this is likely the cause of her vaginal discomfort.  Will have her follow-up with her urologist and Gynecologist for further management.  Labs with anemia and slightly elevated creatinine, but these are at baseline.   She is stable and appropriate for discharge home.  Impression: Uterine prolapse Potential UTI  Disposition:  The patient was discharged home with instructions to follow-up with her urologist within the next week for recheck of symptoms and further management of Foley catheter.  Follow-up with PCP or gynecologist regarding her uterine prolapse today. Return precautions given and patient verbalized understanding.    Record Review: External records from outside source obtained and reviewed including urine culture from September 2025     This chart was dictated using voice recognition software, Nurse, [facility]. Despite the best efforts of this provider to proofread and correct errors, errors may still occur which can change documentation meaning.       Final  diagnoses:  Uterine prolapse  Problem with Foley catheter, initial encounter    ED Discharge Orders          Ordered    cephALEXin  (KEFLEX ) 500 MG capsule  2 times daily        07/26/24 1754  Veta Palma, PA-C 07/27/24 1009    [address] [place], [place] 07/27/24 1507

## 2024-07-26 NOTE — ED Notes (Signed)
Attempted x2 for IV access, unsuccessful.  ?

## 2024-07-26 NOTE — ED Notes (Signed)
 Pt d/c instructions, medications, and follow-up care reviewed with pt and daughter. Pt and daughter verbalized understanding and had no further questions at time of d/c. Pt CA&Ox4, ambulatory, and in NAD at time of d/c

## 2024-07-26 NOTE — ED Triage Notes (Signed)
 Patient reports she thinks her catheter is not draining properly. She feels like it is backfilling in her bladder causing discomfort and bloating. Will obtain bladder scan in triage. See follow up note for results.

## 2024-07-27 LAB — URINE CULTURE: Culture: <10000 — AB

## 2024-07-28 DIAGNOSIS — N814 Uterovaginal prolapse, unspecified: Secondary | ICD-10-CM | POA: Diagnosis not present

## 2024-07-29 DIAGNOSIS — N3010 Interstitial cystitis (chronic) without hematuria: Secondary | ICD-10-CM | POA: Diagnosis not present

## 2024-07-29 DIAGNOSIS — N390 Urinary tract infection, site not specified: Secondary | ICD-10-CM | POA: Diagnosis not present

## 2024-07-29 NOTE — Patient Outreach (Signed)
 Care Coordination   07/29/2024 Name: Adrienne [name] MRN: [id number] DOB: 06/19/1928   Care Coordination Outreach Attempts:  An unsuccessful telephone outreach was attempted today to complete quick check-in following recent ED visit.  Follow Up Plan:  CCM follow-up as scheduled 08/07/24 at 2 PM  Encounter Outcome:  No Answer. HIPAA compliant voicemail left requesting return call.   Rosaline Finlay, RN MSN [place]  [facility] RN Care Manager Direct Dial: [phone]  Fax: [phone]

## 2024-08-05 DIAGNOSIS — N390 Urinary tract infection, site not specified: Secondary | ICD-10-CM | POA: Diagnosis not present

## 2024-08-05 DIAGNOSIS — R338 Other retention of urine: Secondary | ICD-10-CM | POA: Diagnosis not present

## 2024-08-07 ENCOUNTER — Other Ambulatory Visit: Payer: Self-pay

## 2024-08-07 NOTE — Patient Outreach (Signed)
 Care Coordination   08/07/2024 Name: Adrienne [name] MRN: [id number] DOB: January 03, 1928   Care Coordination Outreach Attempts:  An unsuccessful outreach was attempted for an appointment today.  Follow Up Plan:  Additional outreach attempts will be made to complete CCM follow-up visit.   Encounter Outcome:  No Answer. HIPAA compliant voicemail left requesting return call.   Rosaline Finlay, RN MSN [place]  [facility] RN Care Manager Direct Dial: [phone]  Fax: [phone]

## 2024-08-07 NOTE — Patient Instructions (Signed)
 Visit Information  Thank you for taking time to visit with me today. Please don't hesitate to contact me if I can be of assistance to you before our next scheduled appointment.  Your next care management appointment is by telephone on 08/23/24 at 1:30 PM  Please call the care guide team at [phone] if you need to cancel, schedule, or reschedule an appointment.   Please call the Suicide and Crisis Lifeline: 988 call 1-800-273-TALK (toll free, 24 hour hotline) if you are experiencing a Mental Health or Behavioral Health Crisis or need someone to talk to.  Rosaline Finlay, RN MSN [place]  [facility] RN Care Manager Direct Dial: [phone]  Fax: [phone]

## 2024-08-07 NOTE — Patient Outreach (Signed)
 Complex Care Management   Visit Note  08/07/2024  Name:  Adrienne [name] MRN: [id number] DOB: Dec 20, 1927  Situation: Referral received for Complex Care Management related to Heart Failure and high utilization I obtained verbal consent from Patient.  Visit completed with Patient  on the phone  Background:   Past Medical History:  Diagnosis Date   Afib (HCC)    Arthritis of ankle    CAD (coronary artery disease)    Cancer (HCC) 2012   breast cancer-right   Compression fracture of T12 vertebra (HCC)    Congestive heart disease (HCC)    Diverticulosis    Edema    Elevated uric acid in blood    GERD (gastroesophageal reflux disease)    GI bleed 12/2015   Hyperlipidemia    Hypertension    Long-term (current) use of anticoagulants    Moderate mitral regurgitation    Personal history of radiation therapy    Pulmonary HTN (HCC)    Echo 1/19: EF 60-65, no RWMA, Ao sclerosis, trivial AI, mild MR, mod BAE, mod TR, PASP 49   Stroke (HCC) 01/25/2016   TIA (transient ischemic attack) 09/2011   Urinary incontinence    Urinary retention     Assessment: Patient Reported Symptoms:  Cognitive Cognitive Status: Able to follow simple commands, Alert and oriented to person, place, and time, Normal speech and language skills Cognitive/Intellectual Conditions Management [RPT]: Brain Injury Brain Injury: History of CVA   Health Maintenance Behaviors: Annual physical exam, Healthy diet Health Facilitated by: Healthy diet, Rest  Neurological Neurological Review of Symptoms: No symptoms reported Neurological Management Strategies: Medication therapy, Routine screening  HEENT HEENT Symptoms Reported: No symptoms reported HEENT Management Strategies: Routine screening    Cardiovascular Cardiovascular Symptoms Reported: Swelling in legs or feet, Fatigue Does patient have uncontrolled Hypertension?: No Cardiovascular Management Strategies: Medication therapy, Routine screening, Weight  management Do You Have a Working Readable Scale?: Yes Weight: 105 lb (47.6 kg) (Patient reported) Cardiovascular Comment: Patient reports a little bit of swelling around her ankles. She reports weight has been stable. Patient reports last week she was experiencing shortness of breath x2 days and took 2 extra doses of medication with relief. Patient reports next visit with cardiology 09/27/23  Respiratory Respiratory Symptoms Reported: No symptoms reported Respiratory Management Strategies: Medication therapy, Oxygen  therapy  Endocrine Endocrine Symptoms Reported: No symptoms reported Is patient diabetic?: No    Gastrointestinal Gastrointestinal Symptoms Reported: No symptoms reported Additional Gastrointestinal Details: Patient reports continued regular BMs Gastrointestinal Management Strategies: Coping strategies, Medication therapy    Genitourinary Genitourinary Symptoms Reported: No symptoms reported Additional Genitourinary Details: Patient reports I am doing much better from the UTI I had Genitourinary Management Strategies: Catheter, indwelling Genitourinary Comment: Patient reports her next scheduled cathter exchange is 08/27/24  Integumentary Integumentary Symptoms Reported: No symptoms reported    Musculoskeletal Musculoskelatal Symptoms Reviewed: Unsteady gait Musculoskeletal Management Strategies: Adequate rest, Coping strategies, Medical device (Rollator outside of apartment, nothing inside of apartment) Musculoskeletal Comment: Patient denies falls since preivous CMRN visit Falls in the past year?: Yes Number of falls in past year: 1 or less Was there an injury with Fall?: No Fall Risk Category Calculator: 1 Patient Fall Risk Level: Low Fall Risk Patient at Risk for Falls Due to: History of fall(s), Impaired balance/gait Fall risk Follow up: Falls evaluation completed, Education provided, Falls prevention discussed  Psychosocial Psychosocial Symptoms Reported: No symptoms  reported          08/07/2024    PHQ2-9  Depression Screening   Little interest or pleasure in doing things    Feeling down, depressed, or hopeless    PHQ-2 - Total Score    Trouble falling or staying asleep, or sleeping too much    Feeling tired or having little energy    Poor appetite or overeating     Feeling bad about yourself - or that you are a failure or have let yourself or your family down    Trouble concentrating on things, such as reading the newspaper or watching television    Moving or speaking so slowly that other people could have noticed.  Or the opposite - being so fidgety or restless that you have been moving around a lot more than usual    Thoughts that you would be better off dead, or hurting yourself in some way    PHQ2-9 Total Score    If you checked off any problems, how difficult have these problems made it for you to do your work, take care of things at home, or get along with other people    Depression Interventions/Treatment      Today's Vitals   08/07/24 1422  Weight: 105 lb (47.6 kg)      Medications Reviewed Today     Reviewed by Arno Rosaline SQUIBB, RN (Registered Nurse) on 08/07/24 at 1424  Med List Status: <None>   Medication Order Taking? Sig Documenting Provider Last Dose Status Informant  acetaminophen  (TYLENOL ) 500 MG tablet [id number]  Take 1,000 mg by mouth every 12 (twelve) hours as needed (pain). [provider]  Active Self, Pharmacy Records           Med Note STEPHEN, [facility]   Fri Apr 21, 2023 10:04 AM)    albuterol  (VENTOLIN  HFA) 108 [phone] Base) MCG/ACT inhaler [id number]  Inhale 1 puff into the lungs every 6 (six) hours as needed for wheezing or shortness of breath (Cough). Regalado, Belkys A, MD  Active Self, Pharmacy Records           Med Note STEPHEN, [facility]   Fri Apr 21, 2023 10:04 AM)    allopurinol  (ZYLOPRIM ) 100 MG tablet [id number]  Take 1 tablet (100 mg total) by mouth daily. Regalado, Belkys A, MD  Active Self,  Pharmacy Records           Med Note STEPHEN, [facility]   Fri Apr 21, 2023 10:04 AM)    apixaban  (ELIQUIS ) 2.5 MG TABS tablet [id number]  Take 2.5 mg by mouth 2 (two) times daily. [provider]  Active   ascorbic acid  (VITAMIN C ) 500 MG tablet [id number]  Take 500 mg by mouth 2 (two) times daily. [provider]  Active Self, Pharmacy Records  atorvastatin  (LIPITOR) 10 MG tablet [id number]  Take 1 tablet (10 mg total) by mouth daily. Jeffrie Oneil BROCKS, MD  Active Self, Pharmacy Records  B Complex Vitamins (VITAMIN B COMPLEX ) CAPS [id number]  Take 1 capsule by mouth daily. [provider]  Active Self, Pharmacy Records  calcium  carbonate (OSCAL) 1500 (600 Ca) MG TABS tablet [id number]  Take 1 tablet by mouth 2 (two) times daily with a meal. [provider]  Active Self, Pharmacy Records  cyanocobalamin  (VITAMIN B12) 1000 MCG tablet [id number]  Take 1,000 mcg by mouth daily. [provider]  Active Self, Pharmacy Records  docusate sodium  (COLACE) 100 MG capsule [id number]  Take 100 mg by mouth daily as needed (constipation). [provider]  Active Self, Pharmacy Records  Ferrous Sulfate  (IRON ) 325 (  65 Fe) MG TABS [id number]  Take 325 mg by mouth daily. [provider]  Active Self, Pharmacy Records  furosemide  (LASIX ) 40 MG tablet [id number] Yes Take 1 tablet (40 mg total) by mouth daily. Jeffrie Oneil BROCKS, MD  Active Self, Pharmacy Records  metoprolol  tartrate (LOPRESSOR ) 100 MG tablet [id number]  Take 1 tablet (100 mg total) by mouth 2 (two) times daily. Jeffrie Oneil BROCKS, MD  Active Self, Pharmacy Records  mineral oil-hydrophilic petrolatum (AQUAPHOR) ointment [id number]  Apply topically as needed for dry skin.  Patient taking differently: Apply 1 Application topically See admin instructions. Apply to topically every 24 hours as needed for uterine prolapse.   Cathlyn BROCKS Nikki Bobie FORBES, MD  Active Self, Pharmacy Records  Multiple Vitamins-Minerals (CENTRUM  SILVER ADULT 50+) TABS [id number]  Take 1 tablet by mouth daily. [provider]  Active   nitrofurantoin , macrocrystal-monohydrate, (MACROBID ) 100 MG capsule [id number]  Take by mouth. [provider]  Active   omeprazole (PRILOSEC) 10 MG capsule [id number]  Take 10 mg by mouth every other day. [provider]  Active Self, Pharmacy Records           Med Note STEPHEN KLINEFELTER   Wed Aug 25, 2021 11:18 AM)    OXYGEN  [id number]  Inhale 2 L/min into the lungs at bedtime. via [place] [provider]  Active Self, Pharmacy Records  phenazopyridine  (PYRIDIUM ) 100 MG tablet [id number]  Take 100 mg by mouth 3 (three) times daily. [provider]  Active   polyethylene glycol (MIRALAX  / GLYCOLAX ) 17 g packet [id number]  Take 17 g by mouth at bedtime. [provider]  Active Self, Pharmacy Records  potassium chloride  SA (KLOR-CON  M) 20 MEQ tablet [id number]  Take 1 tablet (20 mEq total) by mouth daily. Arrien, Elidia Sieving, MD  Active Self, Pharmacy Records  Probiotic Product (UP4 PROBIOTICS WOMENS PO) [id number]  Take 1 capsule by mouth daily with breakfast. [provider]  Active Self, Pharmacy Records  pyridOXINE (B-6) 50 MG tablet [id number]  1 tablet Orally [provider]  Active Self, Pharmacy Records  Med List Note Geri Jon HERO, CPhT 06/16/24 1342): [facility] [phone]            Recommendation:   Continue Current Plan of Care  Follow Up Plan:   Telephone follow up appointment date/time:  08/23/24 at 1:30 PM  Rosaline Finlay, RN MSN [place]  [facility] RN Care Manager Direct Dial: [phone]  Fax: [phone]

## 2024-08-07 NOTE — Patient Instructions (Signed)
 Graylin Rollene Cure - I am sorry I was unable to reach you today for our scheduled appointment. I work with Cleotilde Planas, MD and am calling to support your healthcare needs. Please contact me at [phone] at your earliest convenience. I look forward to speaking with you soon.   Thank you,  Rosaline Finlay, RN MSN   [facility] RN Care Manager Direct Dial: [phone]  Fax: [phone]

## 2024-08-15 DIAGNOSIS — R338 Other retention of urine: Secondary | ICD-10-CM | POA: Diagnosis not present

## 2024-08-15 DIAGNOSIS — N390 Urinary tract infection, site not specified: Secondary | ICD-10-CM | POA: Diagnosis not present

## 2024-08-23 ENCOUNTER — Telehealth: Payer: Self-pay

## 2024-08-26 ENCOUNTER — Ambulatory Visit (HOSPITAL_COMMUNITY)
Admission: RE | Admit: 2024-08-26 | Discharge: 2024-08-26 | Disposition: A | Source: Ambulatory Visit | Attending: Cardiology | Admitting: Cardiology

## 2024-08-26 DIAGNOSIS — I340 Nonrheumatic mitral (valve) insufficiency: Secondary | ICD-10-CM | POA: Insufficient documentation

## 2024-08-27 ENCOUNTER — Telehealth: Payer: Self-pay

## 2024-08-27 LAB — ECHOCARDIOGRAM COMPLETE
Area-P 1/2: 7.94 cm2
MV M vel: 4.23 m/s
MV Peak grad: 71.6 mmHg
P 1/2 time: 411 ms
Radius: 0.7 cm
S' Lateral: 2.6 cm

## 2024-08-27 NOTE — Patient Instructions (Signed)
 Graylin Rollene Cure - I am sorry I was unable to reach you today for our scheduled appointment. I work with Cleotilde Planas, MD and am calling to support your healthcare needs. Please contact me at [phone] at your earliest convenience. I look forward to speaking with you soon.   Thank you,  Rosaline Finlay, RN MSN   [facility] RN Care Manager Direct Dial: [phone]  Fax: [phone]

## 2024-08-27 NOTE — Patient Outreach (Signed)
 Care Coordination   08/27/2024 Name: Adrienne [name] MRN: [id number] DOB: Apr 20, 1928   Care Coordination Outreach Attempts:  An unsuccessful telephone outreach was attempted today to complete CCM follow-up visit.  Follow Up Plan:  Additional outreach attempts will be made to complete follow-up visit.   Encounter Outcome:  No Answer. HIPAA compliant voicemail left requesting return call.   Rosaline Finlay, RN MSN [place]  [facility] RN Care Manager Direct Dial: [phone]  Fax: [phone]

## 2024-08-29 NOTE — Patient Outreach (Signed)
 Care Coordination   08/29/2024 Name: Adrienne [name] MRN: [id number] DOB: October 25, 1927   Care Coordination Outreach Attempts:  A second unsuccessful outreach was attempted today to complete CCM follow-up visit.  Follow Up Plan:  Additional outreach attempts will be made to complete follow-up visit.   Encounter Outcome:  No Answer. HIPAA compliant voicemail left requesting return call.   Rosaline Finlay, RN MSN [place]  [facility] RN Care Manager Direct Dial: [phone]  Fax: [phone]

## 2024-08-30 ENCOUNTER — Ambulatory Visit: Payer: Self-pay | Admitting: Cardiology

## 2024-09-06 NOTE — Patient Outreach (Signed)
 Care Coordination   09/06/2024 Name: Adrienne [name] MRN: [id number] DOB: 1928-04-24   Care Coordination Outreach Attempts: A third unsuccessful outreach was attempted today to complete CCM follow-up visit.   Follow Up Plan:  No further outreach attempts will be made at this time. We have been unable to contact the patient to complete follow-up visit.  Encounter Outcome:  No Answer. HIPAA compliant voicemail left requesting return call.   Rosaline Finlay, RN MSN [place]  [facility] RN Care Manager Direct Dial: [phone]  Fax: [phone]

## 2024-09-06 NOTE — Patient Instructions (Signed)
 Graylin Rollene Cure - I have attempted to call you three times but have been unsuccessful in reaching you. I work with Cleotilde Planas, MD and am calling to support your healthcare needs. If I can be of assistance to you, please contact me at [phone].     Thank you,  Rosaline Finlay, RN MSN [place]  [facility] RN Care Manager Direct Dial: [phone]  Fax: [phone]

## 2024-09-26 ENCOUNTER — Ambulatory Visit: Attending: Cardiology | Admitting: Cardiology

## 2024-09-26 VITALS — BP 120/60 | HR 74 | Ht 60.0 in | Wt 109.0 lb

## 2024-09-26 DIAGNOSIS — I4821 Permanent atrial fibrillation: Secondary | ICD-10-CM | POA: Diagnosis not present

## 2024-09-26 DIAGNOSIS — I340 Nonrheumatic mitral (valve) insufficiency: Secondary | ICD-10-CM | POA: Insufficient documentation

## 2024-09-26 DIAGNOSIS — N1832 Chronic kidney disease, stage 3b: Secondary | ICD-10-CM | POA: Insufficient documentation

## 2024-09-26 NOTE — Patient Instructions (Addendum)
 Medication Instructions:  The current medical regimen is effective;  continue present plan and medications.  *If you need a refill on your cardiac medications before your next appointment, please call your pharmacy*  Follow-Up: At [facility], you and your health needs are our priority.  As part of our continuing mission to provide you with exceptional heart care, our providers are all part of one team.  This team includes your primary Cardiologist (physician) and Advanced Practice Providers or APPs (Physician Assistants and Nurse Practitioners) who all work together to provide you with the care you need, when you need it.  Your next appointment:   6 month(s)  Provider:   Glendia Ferrier, PA     We recommend signing up for the patient portal called MyChart.  Sign up information is provided on this After Visit Summary.  MyChart is used to connect with patients for Virtual Visits (Telemedicine).  Patients are able to view lab/test results, encounter notes, upcoming appointments, etc.  Non-urgent messages can be sent to your provider as well.   To learn more about what you can do with MyChart, go to [email or web address].

## 2024-09-26 NOTE — Progress Notes (Signed)
 " Cardiology Office Note:  .   Date:  09/26/2024  ID:  Adrienne [name], DOB [birth year], MRN [id number] PCP: Cleotilde Planas, MD  [facility] Providers Cardiologist:  Oneil Parchment, MD     History of Present Illness: .   Adrienne [name] is a 89 y.o. female Discussed the use of AI scribe  History of Present Illness Adrienne [name] is a 89 year old female with chronic diastolic heart failure valvular insufficiency who presents for cardiovascular follow-up  She has chronic diastolic heart failure with an ejection fraction of 60%.  Ankle swelling has been chronic.  Her volume status has been stable. She is currently on furosemide  40 mg daily and potassium 20 mEq daily.  She has atrial fibrillation with a heart rate of 80. She is on Eliquis  with a dose adjusted to 2.75 mg.  She has severe mitral regurgitation and moderate pulmonary hypertension, controlled fairly well with diuresis.  She has stage 3B chronic kidney disease, which is stable. Her creatinine level is 1.29.  Her hemoglobin level was 10.6 on August 01, 2024. Her A1c is 5.7 and LDL is 40. She is on Lipitor 10 mg daily.   I take care of her husband as well   ROS: Denies any chest pain syncope  Studies Reviewed: SABRA   EKG Interpretation Date/Time:  Thursday September 26 2024 16:03:38 EST Ventricular Rate:  80 PR Interval:    QRS Duration:  84 QT Interval:  460 QTC Calculation: 530 R Axis:   26  Text Interpretation: Atrial fibrillation Septal infarct (cited on or before 26-Sep-2024) ST & T wave abnormality, consider anterolateral ischemia Prolonged QT When compared with ECG of 15-Jun-2023 14:41, QRS duration has decreased Borderline criteria for Lateral infarct are no longer Present Confirmed by Parchment Oneil ([id number]) on 09/26/2024 4:26:21 PM    Results Labs Creatinine (09/26/2024): 1.29 Hemoglobin (08/01/2024): 10.6 A1c: 5.7 LDL: 40  Diagnostic EKG (09/26/2024): Atrial fibrillation,  ventricular rate 80 bpm, no significant interval change (Independently interpreted) Echocardiogram (08/2023): Chronic diastolic heart failure with ejection fraction 60%, severe mitral regurgitation, moderate pulmonary hypertension Risk Assessment/Calculations:            Physical Exam:   VS:  BP 120/60 (BP Location: Left Arm, Patient Position: Sitting, Cuff Size: Normal)   Pulse 74   Ht 5' (1.524 m)   Wt 109 lb (49.4 kg)   LMP 09/12/1973   SpO2 96%   BMI 21.29 kg/m    Wt Readings from Last 3 Encounters:  09/26/24 109 lb (49.4 kg)  08/07/24 105 lb (47.6 kg)  07/18/24 109 lb 12.8 oz (49.8 kg)    GEN: Well nourished, well developed in no acute distress NECK: No JVD; No carotid bruits CARDIAC: RRR, no murmurs, no rubs, no gallops RESPIRATORY:  Clear to auscultation without rales, wheezing or rhonchi  ABDOMEN: Soft, non-tender, non-distended EXTREMITIES: 2+ bilateral lower extremity left greater than right edema; No deformity   ASSESSMENT AND PLAN: .    Assessment and Plan Assessment & Plan Chronic diastolic heart failure with preserved ejection fraction (NYHA class IIb, EF 60%) Chronic diastolic heart failure with preserved ejection fraction, NYHA class IIb, EF 60%. Volume status is well-managed. - Continue furosemide  40 mg daily  Permanent atrial fibrillation Rate-controlled atrial fibrillation. EKG shows atrial fibrillation with a heart rate of 80 bpm. On Eliquis  2.5 mg with dose adjustment. - Continue Eliquis  2.5 mg with dose adjustment  Severe mitral regurgitation with moderate pulmonary hypertension Severe mitral regurgitation with  moderate pulmonary hypertension. Continues with medical therapy and surveillance due to advanced stage. - Continue medical therapy and surveillance  Stage 3b chronic kidney disease Stage 3b chronic kidney disease. Creatinine level is 1.29. - Continue current management  Hyperlipidemia - Continue Lipitor 10 mg daily         Dispo: 6  months APP  Signed, Oneil Parchment, MD  "

## 2024-11-26 ENCOUNTER — Ambulatory Visit: Admitting: Obstetrics and Gynecology

## 2024-12-09 ENCOUNTER — Ambulatory Visit

## 2025-07-22 ENCOUNTER — Inpatient Hospital Stay: Admitting: Hematology and Oncology
